# Patient Record
Sex: Male | Born: 1945 | Race: Black or African American | Hispanic: No | Marital: Married | Smoking: Former smoker
Health system: Southern US, Community
[De-identification: ages and names within clinical notes are randomized; demographics above are authoritative.]

## PROBLEM LIST (undated history)

## (undated) ENCOUNTER — Emergency Department (HOSPITAL_COMMUNITY): Payer: Medicare Other | Source: Home / Self Care

## (undated) DIAGNOSIS — E119 Type 2 diabetes mellitus without complications: Secondary | ICD-10-CM

## (undated) DIAGNOSIS — Z860100 Personal history of colon polyps, unspecified: Secondary | ICD-10-CM

## (undated) DIAGNOSIS — I639 Cerebral infarction, unspecified: Secondary | ICD-10-CM

## (undated) DIAGNOSIS — I10 Essential (primary) hypertension: Secondary | ICD-10-CM

## (undated) DIAGNOSIS — G8929 Other chronic pain: Secondary | ICD-10-CM

## (undated) DIAGNOSIS — Z9289 Personal history of other medical treatment: Secondary | ICD-10-CM

## (undated) DIAGNOSIS — M549 Dorsalgia, unspecified: Secondary | ICD-10-CM

## (undated) DIAGNOSIS — E785 Hyperlipidemia, unspecified: Secondary | ICD-10-CM

## (undated) DIAGNOSIS — Z87442 Personal history of urinary calculi: Secondary | ICD-10-CM

## (undated) DIAGNOSIS — K219 Gastro-esophageal reflux disease without esophagitis: Secondary | ICD-10-CM

## (undated) DIAGNOSIS — N400 Enlarged prostate without lower urinary tract symptoms: Secondary | ICD-10-CM

## (undated) HISTORY — PX: OTHER SURGICAL HISTORY: SHX169

## (undated) HISTORY — PX: COLONOSCOPY: SHX174

## (undated) HISTORY — PX: TONSILLECTOMY: SUR1361

## (undated) HISTORY — DX: Cerebral infarction, unspecified: I63.9

## (undated) HISTORY — PX: ESOPHAGOGASTRODUODENOSCOPY: SHX1529

---

## 2000-09-25 ENCOUNTER — Ambulatory Visit (HOSPITAL_BASED_OUTPATIENT_CLINIC_OR_DEPARTMENT_OTHER): Admission: RE | Admit: 2000-09-25 | Discharge: 2000-09-25 | Payer: Self-pay | Admitting: *Deleted

## 2000-09-25 ENCOUNTER — Encounter (INDEPENDENT_AMBULATORY_CARE_PROVIDER_SITE_OTHER): Payer: Self-pay | Admitting: *Deleted

## 2003-06-11 ENCOUNTER — Ambulatory Visit (HOSPITAL_COMMUNITY): Admission: RE | Admit: 2003-06-11 | Discharge: 2003-06-11 | Payer: Self-pay | Admitting: Gastroenterology

## 2003-06-11 ENCOUNTER — Encounter (INDEPENDENT_AMBULATORY_CARE_PROVIDER_SITE_OTHER): Payer: Self-pay | Admitting: *Deleted

## 2004-02-12 ENCOUNTER — Emergency Department (HOSPITAL_COMMUNITY): Admission: EM | Admit: 2004-02-12 | Discharge: 2004-02-12 | Payer: Self-pay | Admitting: Family Medicine

## 2005-02-18 ENCOUNTER — Emergency Department (HOSPITAL_COMMUNITY): Admission: EM | Admit: 2005-02-18 | Discharge: 2005-02-18 | Payer: Self-pay | Admitting: Emergency Medicine

## 2010-04-09 ENCOUNTER — Encounter: Admission: RE | Admit: 2010-04-09 | Discharge: 2010-04-09 | Payer: Self-pay | Admitting: Internal Medicine

## 2010-09-10 ENCOUNTER — Encounter: Payer: Self-pay | Admitting: Internal Medicine

## 2011-01-05 NOTE — Op Note (Signed)
[place]  Patient:    [name] [name], [name] [name]                         MRN: [id number] Proc. Date: 09/25/00 Adm. Date:  [id number] Attending:  Stephenie Acres                           Operative Report  PREOPERATIVE DIAGNOSIS:  Left breast mass.  POSTOPERATIVE DIAGNOSIS:  Left breast mass.  PROCEDURE:  Excisional left breast biopsy.  SURGEON:  Catalina Lunger, M.D.  FINDINGS:  Fatty tumor consistent with a lipoma.  ANESTHESIA:  Local MAC.  DESCRIPTION OF PROCEDURE:  The patient was taken to the operating room and placed in the supine position.  After adequate anesthesia was induced using MAC technique, the left breast was prepped and draped in the normal sterile fashion.  Using 1% lidocaine local anesthesia, the skin and subcutaneous tissue were anesthetized over the 10 oclock lesion of the left breast. Dissected down through subcutaneous tissue and fat under the pectoralis fascia.  The mass was still palpable posterior to the pectoralis fascia. Therefore, the muscles were split until a well-encapsulated fatty tumor which lay posterior to the pectoralis muscle was identified.  It was delivered up and through the wound.  Attachments were divided using Bovie electrocautery. It was sent for pathologic evaluation.  Adequate hemostasis was ensured, and the skin was closed with subcuticular 4-0 Monocryl.  Dermabond dressing was placed.  The patient tolerated the procedure well, went to PACU in good condition. DD:  09/25/00 TD:  09/25/00 Job: [id number]

## 2011-07-03 ENCOUNTER — Other Ambulatory Visit: Payer: Self-pay | Admitting: Gastroenterology

## 2011-09-13 ENCOUNTER — Other Ambulatory Visit: Payer: Self-pay | Admitting: Gastroenterology

## 2013-06-04 ENCOUNTER — Other Ambulatory Visit: Payer: Self-pay | Admitting: Orthopedic Surgery

## 2013-06-24 ENCOUNTER — Encounter (HOSPITAL_COMMUNITY): Payer: Self-pay | Admitting: Pharmacy Technician

## 2013-06-29 NOTE — Pre-Procedure Instructions (Signed)
Clifton Safley [place]  06/29/2013   Your procedure is scheduled on:  Wed, Nov 19 @ 8:30 AM  Report to [facility] Short Stay Entrance A  at 6:30 AM.  Call this number if you have problems the morning of surgery: [phone]   Remember:   Do not eat food or drink liquids after midnight.   Take these medicines the morning of surgery with A SIP OF WATER: Amlodipine(Norvasc)               Stop taking your Aspirin.No Goody's,BC's,Aleve,Ibuprofen,Fish Oil,or any Herbal Medications   Do not wear jewelry  Do not wear lotions, powders, or colognes. You may wear deodorant.  Men may shave face and neck.  Do not bring valuables to the hospital.  [facility] is not responsible                  for any belongings or valuables.               Contacts, dentures or bridgework may not be worn into surgery.  Leave suitcase in the car. After surgery it may be brought to your room.  For patients admitted to the hospital, discharge time is determined by your                treatment team.               Patients discharged the day of surgery will not be allowed to drive  home.    Special Instructions: Shower using CHG 2 nights before surgery and the night before surgery.  If you shower the day of surgery use CHG.  Use special wash - you have one bottle of CHG for all showers.  You should use approximately 1/3 of the bottle for each shower.   Please read over the following fact sheets that you were given: Pain Booklet, Coughing and Deep Breathing, Blood Transfusion Information, MRSA Information and Surgical Site Infection Prevention

## 2013-06-30 ENCOUNTER — Encounter (HOSPITAL_COMMUNITY)
Admission: RE | Admit: 2013-06-30 | Discharge: 2013-06-30 | Disposition: A | Discharge status: Home / Self Care | Payer: Medicare Other | Source: Ambulatory Visit | Attending: Orthopedic Surgery | Admitting: Orthopedic Surgery

## 2013-06-30 ENCOUNTER — Encounter (HOSPITAL_COMMUNITY): Payer: Self-pay

## 2013-06-30 ENCOUNTER — Ambulatory Visit (HOSPITAL_COMMUNITY)
Admission: RE | Admit: 2013-06-30 | Discharge: 2013-06-30 | Disposition: A | Discharge status: Home / Self Care | Payer: Medicare Other | Source: Ambulatory Visit | Attending: Orthopedic Surgery | Admitting: Orthopedic Surgery

## 2013-06-30 DIAGNOSIS — K219 Gastro-esophageal reflux disease without esophagitis: Secondary | ICD-10-CM | POA: Insufficient documentation

## 2013-06-30 DIAGNOSIS — N400 Enlarged prostate without lower urinary tract symptoms: Secondary | ICD-10-CM | POA: Insufficient documentation

## 2013-06-30 DIAGNOSIS — G8929 Other chronic pain: Secondary | ICD-10-CM | POA: Insufficient documentation

## 2013-06-30 DIAGNOSIS — M549 Dorsalgia, unspecified: Secondary | ICD-10-CM | POA: Insufficient documentation

## 2013-06-30 DIAGNOSIS — N182 Chronic kidney disease, stage 2 (mild): Secondary | ICD-10-CM | POA: Insufficient documentation

## 2013-06-30 DIAGNOSIS — Z87891 Personal history of nicotine dependence: Secondary | ICD-10-CM | POA: Insufficient documentation

## 2013-06-30 DIAGNOSIS — I129 Hypertensive chronic kidney disease with stage 1 through stage 4 chronic kidney disease, or unspecified chronic kidney disease: Secondary | ICD-10-CM | POA: Insufficient documentation

## 2013-06-30 HISTORY — DX: Personal history of colonic polyps: Z86.010

## 2013-06-30 HISTORY — DX: Hyperlipidemia, unspecified: E78.5

## 2013-06-30 HISTORY — DX: Gastro-esophageal reflux disease without esophagitis: K21.9

## 2013-06-30 HISTORY — DX: Other chronic pain: G89.29

## 2013-06-30 HISTORY — DX: Dorsalgia, unspecified: M54.9

## 2013-06-30 HISTORY — DX: Personal history of colon polyps, unspecified: Z86.0100

## 2013-06-30 HISTORY — DX: Personal history of urinary calculi: Z87.442

## 2013-06-30 HISTORY — DX: Essential (primary) hypertension: I10

## 2013-06-30 HISTORY — DX: Benign prostatic hyperplasia without lower urinary tract symptoms: N40.0

## 2013-06-30 LAB — URINALYSIS, ROUTINE W REFLEX MICROSCOPIC
Bilirubin Urine: NEGATIVE
Glucose, UA: NEGATIVE mg/dL
Hgb urine dipstick: NEGATIVE
Leukocytes, UA: NEGATIVE
Protein, ur: NEGATIVE mg/dL
Urobilinogen, UA: 1 mg/dL (ref 0.0–1.0)
pH: 5.5 (ref 5.0–8.0)

## 2013-06-30 LAB — TYPE AND SCREEN
ABO/RH(D): A POS
Antibody Screen: NEGATIVE

## 2013-06-30 LAB — CBC WITH DIFFERENTIAL/PLATELET
Eosinophils Absolute: 0.3 10*3/uL (ref 0.0–0.7)
Eosinophils Relative: 4 % (ref 0–5)
Hemoglobin: 14.4 g/dL (ref 13.0–17.0)
Lymphs Abs: 1.9 10*3/uL (ref 0.7–4.0)
MCH: 30.7 pg (ref 26.0–34.0)
MCV: 88.5 fL (ref 78.0–100.0)
Monocytes Absolute: 0.7 10*3/uL (ref 0.1–1.0)
Monocytes Relative: 9 % (ref 3–12)
Platelets: 164 10*3/uL (ref 150–400)
RBC: 4.69 MIL/uL (ref 4.22–5.81)
RDW: 13.8 % (ref 11.5–15.5)

## 2013-06-30 LAB — ABO/RH: ABO/RH(D): A POS

## 2013-06-30 LAB — COMPREHENSIVE METABOLIC PANEL
Alkaline Phosphatase: 73 U/L (ref 39–117)
BUN: 12 mg/dL (ref 6–23)
Calcium: 9.6 mg/dL (ref 8.4–10.5)
Creatinine, Ser: 1.42 mg/dL — ABNORMAL HIGH (ref 0.50–1.35)
GFR calc Af Amer: 57 mL/min — ABNORMAL LOW (ref >90)
GFR calc non Af Amer: 49 mL/min — ABNORMAL LOW (ref >90)
Glucose, Bld: 99 mg/dL (ref 70–99)
Sodium: 142 mEq/L (ref 135–145)
Total Protein: 7.3 g/dL (ref 6.0–8.3)

## 2013-06-30 LAB — PROTIME-INR: Prothrombin Time: 13.3 seconds (ref 11.6–15.2)

## 2013-06-30 LAB — SURGICAL PCR SCREEN: MRSA, PCR: NEGATIVE

## 2013-06-30 MED ORDER — POVIDONE-IODINE 7.5 % EX SOLN: Freq: Once | CUTANEOUS | Status: DC

## 2013-06-30 NOTE — Progress Notes (Signed)
Pt doesn't have a cardiologist  Stress test done >33yrs was done during a routine physical  Denies ever having an echo or heart cath  Medical Md is Dr.Karrar Husain  EKG in chart from 06-24-13  Denies CXR in past yr

## 2013-07-01 NOTE — Progress Notes (Signed)
Anesthesia Chart Review:  Patient is a 67 year old male scheduled for L3-4, L4-5 decompression on 07/08/13 by Dr. Yevette Edwards.  History includes former smoker, HTN, HLD, chronic back pain, GERD, BPH, nephrolithiasis, CKD stage II, tonsillectomy. PCP is Dr. Tyson Dense [facility]) who cleared patient for this procedure.    EKG done at Dr. Paulita Fujita office on 06/24/13 showed SB @ 52, old inferior infarct, consider old anterior infarct, non-specific ST depression.  EKG was reviewed and patient was evaluated by Dr. Eula Listen who ultimately cleared him for this procedure.  CXR on 06/30/13 showed: Cardiac enlargement without heart failure.  Aorta is uncoiled and tortuous.  The lungs are clear without infiltrate, effusion, or mass. (CXR films reviewed with Dr. Noreene Larsson.)  Preoperative labs noted.  Patient has had recent evaluation with EKG with his PCP Dr. Eula Listen who cleared him for this procedure.  If no significant change/new CV symptomology then I would anticipate that he could proceed as planned.  Velna Ochs [facility] Short Stay Center/Anesthesiology Phone [phone] 07/02/2013 3:02 PM

## 2013-07-02 ENCOUNTER — Other Ambulatory Visit: Payer: Self-pay | Admitting: Orthopedic Surgery

## 2013-07-07 MED ORDER — CEFAZOLIN SODIUM-DEXTROSE 2-3 GM-% IV SOLR
2.0000 g | INTRAVENOUS | Status: AC
  Administered 2013-07-08: 2 g via INTRAVENOUS
  Filled 2013-07-07: qty 50

## 2013-07-08 ENCOUNTER — Ambulatory Visit (HOSPITAL_COMMUNITY)
Admission: RE | Admit: 2013-07-08 | Discharge: 2013-07-08 | Disposition: A | Discharge status: Home / Self Care | Payer: Medicare Other | Source: Ambulatory Visit | Attending: Orthopedic Surgery | Admitting: Orthopedic Surgery

## 2013-07-08 ENCOUNTER — Encounter (HOSPITAL_COMMUNITY): Payer: Self-pay | Admitting: Certified Registered Nurse Anesthetist

## 2013-07-08 ENCOUNTER — Encounter (HOSPITAL_COMMUNITY)
Admission: RE | Disposition: A | Discharge status: Home / Self Care | Payer: Self-pay | Source: Ambulatory Visit | Attending: Orthopedic Surgery

## 2013-07-08 ENCOUNTER — Ambulatory Visit (HOSPITAL_COMMUNITY): Payer: Medicare Other | Admitting: Certified Registered"

## 2013-07-08 ENCOUNTER — Ambulatory Visit (HOSPITAL_COMMUNITY): Payer: Medicare Other

## 2013-07-08 ENCOUNTER — Encounter (HOSPITAL_COMMUNITY): Payer: Medicare Other | Admitting: Vascular Surgery

## 2013-07-08 DIAGNOSIS — M48061 Spinal stenosis, lumbar region without neurogenic claudication: Secondary | ICD-10-CM

## 2013-07-08 DIAGNOSIS — K219 Gastro-esophageal reflux disease without esophagitis: Secondary | ICD-10-CM | POA: Insufficient documentation

## 2013-07-08 DIAGNOSIS — D1739 Benign lipomatous neoplasm of skin and subcutaneous tissue of other sites: Secondary | ICD-10-CM | POA: Insufficient documentation

## 2013-07-08 DIAGNOSIS — E785 Hyperlipidemia, unspecified: Secondary | ICD-10-CM | POA: Insufficient documentation

## 2013-07-08 DIAGNOSIS — Z7982 Long term (current) use of aspirin: Secondary | ICD-10-CM | POA: Insufficient documentation

## 2013-07-08 DIAGNOSIS — I10 Essential (primary) hypertension: Secondary | ICD-10-CM | POA: Insufficient documentation

## 2013-07-08 DIAGNOSIS — M48062 Spinal stenosis, lumbar region with neurogenic claudication: Secondary | ICD-10-CM | POA: Insufficient documentation

## 2013-07-08 HISTORY — PX: LUMBAR LAMINECTOMY/DECOMPRESSION MICRODISCECTOMY: SHX5026

## 2013-07-08 SURGERY — LUMBAR LAMINECTOMY/DECOMPRESSION MICRODISCECTOMY
Anesthesia: General | Site: Spine Lumbar | Wound class: Clean

## 2013-07-08 MED ORDER — THROMBIN 20000 UNITS EX SOLR
CUTANEOUS | Status: DC | PRN
  Administered 2013-07-08: 09:00:00

## 2013-07-08 MED ORDER — MIDAZOLAM HCL 5 MG/5ML IJ SOLN
INTRAMUSCULAR | Status: DC | PRN
  Administered 2013-07-08: 2 mg via INTRAVENOUS

## 2013-07-08 MED ORDER — ROCURONIUM BROMIDE 100 MG/10ML IV SOLN
INTRAVENOUS | Status: DC | PRN
  Administered 2013-07-08: 10 mg via INTRAVENOUS
  Administered 2013-07-08: 50 mg via INTRAVENOUS
  Administered 2013-07-08: 10 mg via INTRAVENOUS

## 2013-07-08 MED ORDER — METHYLENE BLUE 1 % INJ SOLN
INTRAMUSCULAR | Status: DC | PRN
  Administered 2013-07-08: .5 mL

## 2013-07-08 MED ORDER — HYDROMORPHONE HCL PF 1 MG/ML IJ SOLN
0.2500 mg | INTRAMUSCULAR | Status: DC | PRN
  Administered 2013-07-08 (×2): 0.5 mg via INTRAVENOUS

## 2013-07-08 MED ORDER — ONDANSETRON HCL 4 MG/2ML IJ SOLN
INTRAMUSCULAR | Status: DC | PRN
  Administered 2013-07-08: 4 mg via INTRAVENOUS

## 2013-07-08 MED ORDER — METHYLENE BLUE 1 % INJ SOLN
INTRAMUSCULAR | Status: AC
  Filled 2013-07-08: qty 10

## 2013-07-08 MED ORDER — OXYCODONE-ACETAMINOPHEN 5-325 MG PO TABS
1.0000 | ORAL_TABLET | ORAL | Status: DC | PRN
  Administered 2013-07-08: 2 via ORAL

## 2013-07-08 MED ORDER — ONDANSETRON HCL 4 MG/2ML IJ SOLN: 4.0000 mg | Freq: Once | INTRAMUSCULAR | Status: DC | PRN

## 2013-07-08 MED ORDER — HYDROMORPHONE HCL PF 1 MG/ML IJ SOLN
INTRAMUSCULAR | Status: AC
  Filled 2013-07-08: qty 1

## 2013-07-08 MED ORDER — EPHEDRINE SULFATE 50 MG/ML IJ SOLN
INTRAMUSCULAR | Status: DC | PRN
  Administered 2013-07-08: 10 mg via INTRAVENOUS
  Administered 2013-07-08 (×3): 5 mg via INTRAVENOUS

## 2013-07-08 MED ORDER — MINERAL OIL LIGHT 100 % EX OIL
TOPICAL_OIL | CUTANEOUS | Status: AC
  Filled 2013-07-08: qty 25

## 2013-07-08 MED ORDER — LACTATED RINGERS IV SOLN
INTRAVENOUS | Status: DC | PRN
  Administered 2013-07-08 (×2): via INTRAVENOUS

## 2013-07-08 MED ORDER — THROMBIN 20000 UNITS EX SOLR
CUTANEOUS | Status: AC
  Filled 2013-07-08: qty 20000

## 2013-07-08 MED ORDER — FENTANYL CITRATE 0.05 MG/ML IJ SOLN
INTRAMUSCULAR | Status: DC | PRN
  Administered 2013-07-08 (×2): 50 ug via INTRAVENOUS
  Administered 2013-07-08: 100 ug via INTRAVENOUS
  Administered 2013-07-08: 50 ug via INTRAVENOUS

## 2013-07-08 MED ORDER — OXYCODONE-ACETAMINOPHEN 5-325 MG PO TABS
ORAL_TABLET | ORAL | Status: AC
  Administered 2013-07-08: 2 via ORAL
  Filled 2013-07-08: qty 2

## 2013-07-08 MED ORDER — GLYCOPYRROLATE 0.2 MG/ML IJ SOLN
INTRAMUSCULAR | Status: DC | PRN
  Administered 2013-07-08: .8 mg via INTRAVENOUS

## 2013-07-08 MED ORDER — LIDOCAINE HCL (CARDIAC) 20 MG/ML IV SOLN
INTRAVENOUS | Status: DC | PRN
  Administered 2013-07-08: 80 mg via INTRAVENOUS

## 2013-07-08 MED ORDER — PROPOFOL 10 MG/ML IV BOLUS
INTRAVENOUS | Status: DC | PRN
  Administered 2013-07-08: 200 mg via INTRAVENOUS

## 2013-07-08 MED ORDER — HEMOSTATIC AGENTS (NO CHARGE) OPTIME
TOPICAL | Status: DC | PRN
  Administered 2013-07-08: 1

## 2013-07-08 MED ORDER — METHYLPREDNISOLONE ACETATE 40 MG/ML IJ SUSP
INTRAMUSCULAR | Status: DC | PRN
  Administered 2013-07-08: 1 mL

## 2013-07-08 MED ORDER — NEOSTIGMINE METHYLSULFATE 1 MG/ML IJ SOLN
INTRAMUSCULAR | Status: DC | PRN
  Administered 2013-07-08: 5 mg via INTRAVENOUS

## 2013-07-08 MED ORDER — BUPIVACAINE-EPINEPHRINE 0.25% -1:200000 IJ SOLN
INTRAMUSCULAR | Status: DC | PRN
  Administered 2013-07-08: 4 mL

## 2013-07-08 MED ORDER — METHYLPREDNISOLONE ACETATE 40 MG/ML IJ SUSP
INTRAMUSCULAR | Status: AC
  Filled 2013-07-08: qty 1

## 2013-07-08 SURGICAL SUPPLY — 71 items
APL SKNCLS STERI-STRIP NONHPOA (GAUZE/BANDAGES/DRESSINGS) ×1
BENZOIN TINCTURE PRP APPL 2/3 (GAUZE/BANDAGES/DRESSINGS) ×1 IMPLANT
BUR ROUND PRECISION 4.0 (BURR) ×2 IMPLANT
CANISTER SUCTION 2500CC (MISCELLANEOUS) ×2 IMPLANT
CARTRIDGE OIL MAESTRO DRILL (MISCELLANEOUS) ×1 IMPLANT
CLOTH BEACON ORANGE TIMEOUT ST (SAFETY) ×2 IMPLANT
CLSR STERI-STRIP ANTIMIC 1/2X4 (GAUZE/BANDAGES/DRESSINGS) ×1 IMPLANT
CORDS BIPOLAR (ELECTRODE) ×2 IMPLANT
COVER SURGICAL LIGHT HANDLE (MISCELLANEOUS) ×2 IMPLANT
DIFFUSER DRILL AIR PNEUMATIC (MISCELLANEOUS) ×2 IMPLANT
DRAIN CHANNEL 15F RND FF W/TCR (WOUND CARE) IMPLANT
DRAPE POUCH INSTRU U-SHP 10X18 (DRAPES) ×4 IMPLANT
DRAPE SURG 17X23 STRL (DRAPES) ×8 IMPLANT
DURAPREP 26ML APPLICATOR (WOUND CARE) ×2 IMPLANT
ELECT BLADE 4.0 EZ CLEAN MEGAD (MISCELLANEOUS)
ELECT CAUTERY BLADE 6.4 (BLADE) ×2 IMPLANT
ELECT REM PT RETURN 9FT ADLT (ELECTROSURGICAL) ×2
ELECTRODE BLDE 4.0 EZ CLN MEGD (MISCELLANEOUS) IMPLANT
ELECTRODE REM PT RTRN 9FT ADLT (ELECTROSURGICAL) ×1 IMPLANT
EVACUATOR SILICONE 100CC (DRAIN) IMPLANT
FILTER STRAW FLUID ASPIR (MISCELLANEOUS) ×2 IMPLANT
GAUZE SPONGE 4X4 16PLY XRAY LF (GAUZE/BANDAGES/DRESSINGS) ×4 IMPLANT
GLOVE BIO SURGEON STRL SZ7 (GLOVE) ×2 IMPLANT
GLOVE BIO SURGEON STRL SZ8 (GLOVE) ×2 IMPLANT
GLOVE BIOGEL PI IND STRL 7.0 (GLOVE) ×1 IMPLANT
GLOVE BIOGEL PI IND STRL 8 (GLOVE) ×1 IMPLANT
GLOVE BIOGEL PI INDICATOR 7.0 (GLOVE) ×1
GLOVE BIOGEL PI INDICATOR 8 (GLOVE) ×1
GOWN STRL NON-REIN LRG LVL3 (GOWN DISPOSABLE) ×2 IMPLANT
GOWN STRL REIN XL XLG (GOWN DISPOSABLE) ×4 IMPLANT
IV CATH 14GX2 1/4 (CATHETERS) ×2 IMPLANT
KIT BASIN OR (CUSTOM PROCEDURE TRAY) ×2 IMPLANT
KIT POSITION SURG JACKSON T1 (MISCELLANEOUS) ×2 IMPLANT
KIT ROOM TURNOVER OR (KITS) ×2 IMPLANT
NDL 18GX1X1/2 (RX/OR ONLY) (NEEDLE) ×1 IMPLANT
NDL HYPO 25GX1X1/2 BEV (NEEDLE) ×1 IMPLANT
NDL SPNL 18GX3.5 QUINCKE PK (NEEDLE) ×2 IMPLANT
NEEDLE 18GX1X1/2 (RX/OR ONLY) (NEEDLE) ×2 IMPLANT
NEEDLE 22X1 1/2 (OR ONLY) (NEEDLE) ×2 IMPLANT
NEEDLE HYPO 25GX1X1/2 BEV (NEEDLE) ×2 IMPLANT
NEEDLE SPNL 18GX3.5 QUINCKE PK (NEEDLE) ×4 IMPLANT
NS IRRIG 1000ML POUR BTL (IV SOLUTION) ×2 IMPLANT
OIL CARTRIDGE MAESTRO DRILL (MISCELLANEOUS) ×2
PACK LAMINECTOMY ORTHO (CUSTOM PROCEDURE TRAY) ×2 IMPLANT
PACK UNIVERSAL I (CUSTOM PROCEDURE TRAY) ×2 IMPLANT
PAD ARMBOARD 7.5X6 YLW CONV (MISCELLANEOUS) ×4 IMPLANT
PATTIES SURGICAL .5 X.5 (GAUZE/BANDAGES/DRESSINGS) IMPLANT
PATTIES SURGICAL .5 X1 (DISPOSABLE) ×2 IMPLANT
SPONGE GAUZE 4X4 12PLY (GAUZE/BANDAGES/DRESSINGS) ×2 IMPLANT
SPONGE INTESTINAL PEANUT (DISPOSABLE) ×2 IMPLANT
SPONGE SURGIFOAM ABS GEL 100 (HEMOSTASIS) ×2 IMPLANT
STRIP CLOSURE SKIN 1/2X4 (GAUZE/BANDAGES/DRESSINGS) IMPLANT
SURGIFLO TRUKIT (HEMOSTASIS) IMPLANT
SUT MNCRL AB 4-0 PS2 18 (SUTURE) ×2 IMPLANT
SUT VIC AB 0 CT1 18XCR BRD 8 (SUTURE) IMPLANT
SUT VIC AB 0 CT1 27 (SUTURE)
SUT VIC AB 0 CT1 27XBRD ANBCTR (SUTURE) IMPLANT
SUT VIC AB 0 CT1 8-18 (SUTURE) ×2
SUT VIC AB 1 CT1 18XCR BRD 8 (SUTURE) ×1 IMPLANT
SUT VIC AB 1 CT1 8-18 (SUTURE) ×2
SUT VIC AB 2-0 CT2 18 VCP726D (SUTURE) ×2 IMPLANT
SYR 20CC LL (SYRINGE) IMPLANT
SYR BULB IRRIGATION 50ML (SYRINGE) ×2 IMPLANT
SYR CONTROL 10ML LL (SYRINGE) ×4 IMPLANT
SYR TB 1ML 26GX3/8 SAFETY (SYRINGE) ×4 IMPLANT
SYR TB 1ML LUER SLIP (SYRINGE) ×4 IMPLANT
TAPE CLOTH SURG 4X10 WHT LF (GAUZE/BANDAGES/DRESSINGS) ×1 IMPLANT
TOWEL OR 17X24 6PK STRL BLUE (TOWEL DISPOSABLE) ×2 IMPLANT
TOWEL OR 17X26 10 PK STRL BLUE (TOWEL DISPOSABLE) ×2 IMPLANT
WATER STERILE IRR 1000ML POUR (IV SOLUTION) ×2 IMPLANT
YANKAUER SUCT BULB TIP NO VENT (SUCTIONS) ×2 IMPLANT

## 2013-07-08 NOTE — H&P (Signed)
PREOPERATIVE H&P  Chief Complaint: bilateral leg pain  HPI: [name] [name] is a 67 y.o. male who presents withongoing L > R leg pain. MRI = 3-5 severe spinal stenosis. Patient failed multiple forms of conservative care.  Past Medical History  Diagnosis Date  . Hypertension     takes Amlodipine daily and Lotensin as well  . Hyperlipidemia     takes Crestor daily  . Chronic back pain     stenosis  . GERD (gastroesophageal reflux disease)     occasionally will take a zantac(maybe once a month)  . History of colon polyps   . Enlarged prostate   . History of kidney stones    Past Surgical History  Procedure Laterality Date  . Tonsillectomy      as a child  . Fatty tissue removed from stomach    . Colonoscopy    . Esophagogastroduodenoscopy    . Right ankle surgery      as child    History   Social History  . Marital Status: Married    Spouse Name: N/A    Number of Children: N/A  . Years of Education: N/A   Social History Main Topics  . Smoking status: Former Smoker -- 0.25 packs/day for 40 years    Types: Cigarettes  . Smokeless tobacco: None     Comment: none since 06/24/13  . Alcohol Use: No  . Drug Use: No  . Sexual Activity: None   Other Topics Concern  . None   Social History Narrative  . None   History reviewed. No pertinent family history. No Known Allergies Prior to Admission medications   Medication Sig Start Date End Date Taking? Authorizing Provider  acetaminophen (TYLENOL) 500 MG tablet Take 1,000 mg by mouth daily as needed for mild pain.   Yes Historical Provider, MD  amLODipine (NORVASC) 5 MG tablet Take 5 mg by mouth daily.   Yes Historical Provider, MD  aspirin 81 MG tablet Take 81 mg by mouth daily.   Yes Historical Provider, MD  benazepril-hydrochlorthiazide (LOTENSIN HCT) 20-25 MG per tablet Take 1 tablet by mouth daily.   Yes Historical Provider, MD  rosuvastatin (CRESTOR) 10 MG tablet Take 10 mg by mouth daily.   Yes Historical Provider,  MD     All other systems have been reviewed and were otherwise negative with the exception of those mentioned in the HPI and as above.  Physical Exam: Filed Vitals:   07/08/13 0732  BP: 169/91  Pulse:   Temp:   Resp: 18    General: Alert, no acute distress Cardiovascular: No pedal edema Respiratory: No cyanosis, no use of accessory musculature Skin: No lesions in the area of chief complaint Neurologic: Sensation intact distally Psychiatric: Patient is competent for consent with normal mood and affect Lymphatic: No axillary or cervical lymphadenopathy   Assessment/Plan: Lumbar stenosis Plan for Procedure(s): LUMBAR LAMINECTOMY/DECOMPRESSION L3-5   Emilee Hero, MD 07/08/2013 8:11 AM

## 2013-07-08 NOTE — Anesthesia Preprocedure Evaluation (Addendum)
Anesthesia Evaluation  Patient identified by MRN, date of birth, ID band Patient awake    Reviewed: Allergy & Precautions, H&P , NPO status , Patient's Chart, lab work & pertinent test results  Airway Mallampati: II TM Distance: >3 FB Neck ROM: Full    Dental  (+) Teeth Intact and Missing,    Pulmonary Current Smoker, former smoker,  breath sounds clear to auscultation        Cardiovascular hypertension, Rhythm:Regular Rate:Normal     Neuro/Psych    GI/Hepatic GERD-  ,  Endo/Other    Renal/GU      Musculoskeletal   Abdominal Normal abdominal exam  (+)   Peds  Hematology   Anesthesia Other Findings   Reproductive/Obstetrics                          Anesthesia Physical Anesthesia Plan  ASA: II  Anesthesia Plan: General   Post-op Pain Management:    Induction: Intravenous  Airway Management Planned: Oral ETT  Additional Equipment:   Intra-op Plan:   Post-operative Plan: Extubation in OR  Informed Consent: I have reviewed the patients History and Physical, chart, labs and discussed the procedure including the risks, benefits and alternatives for the proposed anesthesia with the patient or authorized representative who has indicated his/her understanding and acceptance.   Dental advisory given  Plan Discussed with: CRNA, Anesthesiologist and Surgeon  Anesthesia Plan Comments:        Anesthesia Quick Evaluation

## 2013-07-08 NOTE — Preoperative (Signed)
Beta Blockers   Reason not to administer Beta Blockers:Not Applicable 

## 2013-07-08 NOTE — Anesthesia Postprocedure Evaluation (Signed)
  Anesthesia Post-op Note  Patient: [name] [name]  Procedure(s) Performed: Procedure(s) with comments: LUMBAR LAMINECTOMY/DECOMPRESSION MICRODISCECTOMY (N/A) - Lumbar 3-4, lumbar 4-5 decompression  Patient Location: PACU  Anesthesia Type:General  Level of Consciousness: awake, alert , oriented and patient cooperative  Airway and Oxygen Therapy: Patient Spontanous Breathing  Post-op Pain: mild  Post-op Assessment: Post-op Vital signs reviewed, Patient's Cardiovascular Status Stable, Respiratory Function Stable, Patent Airway, No signs of Nausea or vomiting and Pain level controlled  Post-op Vital Signs: stable  Complications: No apparent anesthesia complications

## 2013-07-08 NOTE — Transfer of Care (Signed)
Immediate Anesthesia Transfer of Care Note  Patient: [name] [name]  Procedure(s) Performed: Procedure(s) with comments: LUMBAR LAMINECTOMY/DECOMPRESSION MICRODISCECTOMY (N/A) - Lumbar 3-4, lumbar 4-5 decompression  Patient Location: PACU  Anesthesia Type:General  Level of Consciousness: awake, alert  and oriented  Airway & Oxygen Therapy: Patient Spontanous Breathing  Post-op Assessment: Report given to PACU RN  Post vital signs: Reviewed and stable  Complications: No apparent anesthesia complications

## 2013-07-08 NOTE — Progress Notes (Signed)
Sacral dressing not needed.

## 2013-07-08 NOTE — Progress Notes (Signed)
Patient states pain of 5 is an ok level to go home with.

## 2013-07-09 NOTE — Op Note (Signed)
NAME:  [name] [name], [name] [name] NO.:  [id number]  MEDICAL RECORD NO.:  [id number]  LOCATION:  [facility]                         FACILITY:  [facility]  PHYSICIAN:  Estill Bamberg, MD      DATE OF BIRTH:  [birth year]  DATE OF PROCEDURE:  07/08/2013 DATE OF DISCHARGE:  07/08/2013                              OPERATIVE REPORT   PREOPERATIVE DIAGNOSES: 1. L3-4, L4-5 spinal stenosis. 2. Neurogenic claudication.  POSTOPERATIVE DIAGNOSES: 1. L3-4, L4-5 spinal stenosis. 2. Neurogenic claudication.  PROCEDURES:  L3-4, L4-5 laminectomy with bilateral partial facetectomy and bilateral foraminotomy.  SURGEON:  Estill Bamberg, MD  ASSISTANT:  Jason Coop, PA-C  ANESTHESIA:  General endotracheal anesthesia.  COMPLICATIONS:  None.  DISPOSITION:  Stable.  ESTIMATED BLOOD LOSS:  100 mL.  INDICATIONS FOR PROCEDURE:  Briefly, [name] [name] is a very pleasant 67- year-old male, who did present to me on Jan 09, 2013, with significant bilateral leg pain.  An MRI did reveal a rather substantial spinal stenosis at L4-5 greater than L3-4.  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  In any event, the patient did go forward with an extensive conservative care including physical therapy, and multiple epidural injections.  The patient did get temporary relief with these injections, but his pain did recur after each one.  Given his ongoing pain and disability, we did discuss proceeding with a 2-level decompressive procedure.  The patient did fully understand the risks and limitations of the procedure as outlined in my preoperative note.  OPERATIVE DETAILS:  On July 08, 2013, the patient was brought to surgery and general endotracheal anesthesia was administered.  The patient was placed prone on a well-padded Jackson frame available with a spinal frame.  Antibiotics were given and a time-out procedure  was performed.  The back was prepped and draped in the usual sterile fashion.  I then made an incision overlying the L3-4 and L4-5 interspaces.  Of note, there was a superficial lipoma encountered directly over the midline.  The majority of lipoma was removed.  I then incised the fascia at the midline.  The paraspinal musculature was bluntly swept laterally on the right and the left sides.  The lamina of L3, L4, and L5 was identified and subperiosteally exposed.  A lateral intraoperative radiograph did confirm the appropriate operative level. I then proceeded with removal of the L3 and L4 spinous processes.  I then used a 4-mm high-speed bur to remove the central aspect of the L3 and L4 lamina.  I then used a series of Kerrison punches to perform a thorough and complete central laminectomy.  Then, first on the patient's right side, I did perform a lateral recess decompression from superiorly to inferiorly, removing the facet hypertrophy and substantial ligamentum flavum.  A foraminotomy was also performed by removing the superior articular processes causing the neuroforaminal stenosis.  I then turned my attention towards the patient's left side.  Once again, a lateral recess decompression and a neural foraminal decompression was performed as previously described.  Of note, there was abundant facet hypertrophy noted bilaterally, which was clearly  causing substantial compression of the spinal canal.  However, I was extremely pleased with the final decompression noted.  There was some minor epidural bleeding which was controlled using bipolar electrocautery.  Surgiflo was also utilized to control the bleeding.  At the termination of this portion of the procedure, there was no undue bleeding noted and there was no extravasation of cerebrospinal fluid noted.  I then copiously irrigated the wound with approximately 1 L of normal saline.  A 40 mg of Depo- Medrol was introduced about the epidural  space.  The fascia was then closed using #1 Vicryl.  The subcutaneous layer was then closed using 2- 0 Vicryl and the skin was closed using 3-0 Monocryl.  Benzoin and Steri- Strips were applied and followed by sterile dressing.  All instrument counts were correct at the termination of the procedure.  Of note, Jason Coop was my assistant throughout the entirety of the procedure, and did aid in essential retraction and suctioning needed throughout the surgery.     Estill Bamberg, MD     MD/MEDQ  D:  07/08/2013  T:  07/09/2013  Job:  [id number]  cc:   Georgann Housekeeper, MD

## 2013-07-10 ENCOUNTER — Encounter (HOSPITAL_COMMUNITY): Payer: Self-pay | Admitting: Orthopedic Surgery

## 2014-11-23 ENCOUNTER — Other Ambulatory Visit: Payer: Self-pay | Admitting: Orthopedic Surgery

## 2014-12-10 ENCOUNTER — Encounter (HOSPITAL_COMMUNITY): Payer: Self-pay

## 2014-12-10 ENCOUNTER — Encounter (HOSPITAL_COMMUNITY)
Admission: RE | Admit: 2014-12-10 | Discharge: 2014-12-10 | Disposition: A | Discharge status: Home / Self Care | Payer: Medicare Other | Source: Ambulatory Visit | Attending: Orthopedic Surgery | Admitting: Orthopedic Surgery

## 2014-12-10 ENCOUNTER — Ambulatory Visit (HOSPITAL_COMMUNITY)
Admission: RE | Admit: 2014-12-10 | Discharge: 2014-12-10 | Disposition: A | Discharge status: Home / Self Care | Payer: Medicare Other | Source: Ambulatory Visit | Attending: Orthopedic Surgery | Admitting: Orthopedic Surgery

## 2014-12-10 DIAGNOSIS — Z87891 Personal history of nicotine dependence: Secondary | ICD-10-CM | POA: Diagnosis not present

## 2014-12-10 DIAGNOSIS — Z01818 Encounter for other preprocedural examination: Secondary | ICD-10-CM | POA: Diagnosis not present

## 2014-12-10 DIAGNOSIS — R9431 Abnormal electrocardiogram [ECG] [EKG]: Secondary | ICD-10-CM | POA: Insufficient documentation

## 2014-12-10 DIAGNOSIS — R05 Cough: Secondary | ICD-10-CM | POA: Diagnosis not present

## 2014-12-10 DIAGNOSIS — M79604 Pain in right leg: Secondary | ICD-10-CM | POA: Diagnosis not present

## 2014-12-10 DIAGNOSIS — M4806 Spinal stenosis, lumbar region: Secondary | ICD-10-CM | POA: Insufficient documentation

## 2014-12-10 DIAGNOSIS — Z0183 Encounter for blood typing: Secondary | ICD-10-CM | POA: Diagnosis not present

## 2014-12-10 DIAGNOSIS — R001 Bradycardia, unspecified: Secondary | ICD-10-CM | POA: Diagnosis not present

## 2014-12-10 DIAGNOSIS — M79605 Pain in left leg: Secondary | ICD-10-CM | POA: Diagnosis not present

## 2014-12-10 DIAGNOSIS — I10 Essential (primary) hypertension: Secondary | ICD-10-CM | POA: Insufficient documentation

## 2014-12-10 DIAGNOSIS — Z01812 Encounter for preprocedural laboratory examination: Secondary | ICD-10-CM | POA: Diagnosis not present

## 2014-12-10 HISTORY — DX: Personal history of other medical treatment: Z92.89

## 2014-12-10 LAB — CBC WITH DIFFERENTIAL/PLATELET
Basophils Absolute: 0 10*3/uL (ref 0.0–0.1)
Basophils Relative: 0 % (ref 0–1)
Eosinophils Absolute: 0.2 10*3/uL (ref 0.0–0.7)
Eosinophils Relative: 3 % (ref 0–5)
HEMATOCRIT: 40.6 % (ref 39.0–52.0)
HEMOGLOBIN: 13.8 g/dL (ref 13.0–17.0)
LYMPHS ABS: 1.7 10*3/uL (ref 0.7–4.0)
LYMPHS PCT: 24 % (ref 12–46)
MCH: 29.9 pg (ref 26.0–34.0)
MCHC: 34 g/dL (ref 30.0–36.0)
MCV: 87.9 fL (ref 78.0–100.0)
MONOS PCT: 9 % (ref 3–12)
Monocytes Absolute: 0.6 10*3/uL (ref 0.1–1.0)
Neutro Abs: 4.5 10*3/uL (ref 1.7–7.7)
Neutrophils Relative %: 64 % (ref 43–77)
Platelets: 222 10*3/uL (ref 150–400)
RBC: 4.62 MIL/uL (ref 4.22–5.81)
RDW: 14 % (ref 11.5–15.5)
WBC: 7 10*3/uL (ref 4.0–10.5)

## 2014-12-10 LAB — TYPE AND SCREEN
ABO/RH(D): A POS
ANTIBODY SCREEN: NEGATIVE

## 2014-12-10 LAB — COMPREHENSIVE METABOLIC PANEL
ALK PHOS: 61 U/L (ref 39–117)
ALT: 69 U/L — ABNORMAL HIGH (ref 0–53)
ANION GAP: 11 (ref 5–15)
AST: 34 U/L (ref 0–37)
Albumin: 4 g/dL (ref 3.5–5.2)
BILIRUBIN TOTAL: 1 mg/dL (ref 0.3–1.2)
BUN: 10 mg/dL (ref 6–23)
CHLORIDE: 103 mmol/L (ref 96–112)
CO2: 28 mmol/L (ref 19–32)
Calcium: 9.5 mg/dL (ref 8.4–10.5)
Creatinine, Ser: 1.42 mg/dL — ABNORMAL HIGH (ref 0.50–1.35)
GFR, EST AFRICAN AMERICAN: 57 mL/min — AB (ref >90)
GFR, EST NON AFRICAN AMERICAN: 49 mL/min — AB (ref >90)
GLUCOSE: 110 mg/dL — AB (ref 70–99)
POTASSIUM: 3.3 mmol/L — AB (ref 3.5–5.1)
Sodium: 142 mmol/L (ref 135–145)
Total Protein: 7.3 g/dL (ref 6.0–8.3)

## 2014-12-10 LAB — SURGICAL PCR SCREEN
MRSA, PCR: NEGATIVE
Staphylococcus aureus: NEGATIVE

## 2014-12-10 LAB — URINALYSIS, ROUTINE W REFLEX MICROSCOPIC
BILIRUBIN URINE: NEGATIVE
GLUCOSE, UA: NEGATIVE mg/dL
HGB URINE DIPSTICK: NEGATIVE
Ketones, ur: NEGATIVE mg/dL
Leukocytes, UA: NEGATIVE
Nitrite: NEGATIVE
PROTEIN: NEGATIVE mg/dL
Specific Gravity, Urine: 1.022 (ref 1.005–1.030)
UROBILINOGEN UA: 1 mg/dL (ref 0.0–1.0)
pH: 5.5 (ref 5.0–8.0)

## 2014-12-10 LAB — PROTIME-INR
INR: 1.06 (ref 0.00–1.49)
PROTHROMBIN TIME: 14 s (ref 11.6–15.2)

## 2014-12-10 LAB — APTT: aPTT: 33 seconds (ref 24–37)

## 2014-12-10 NOTE — Pre-Procedure Instructions (Signed)
Humbert Morozov [place]  12/10/2014   Your procedure is scheduled on: Wednesday, December 15, 2014  Report to [facility] Admitting at 12:00 PM.  Call this number if you have problems the morning of surgery: [phone]   Remember:   Do not eat food or drink liquids after midnight Tuesday, December 14, 2014    Take these medicines the morning of surgery with A SIP OF WATER: amLODipine (NORVASC), if needed: acetaminophen (TYLENOL) for pain  Stop taking Aspirin, vitamins and herbal medications. Do not take any NSAIDs ie: Ibuprofen, Advil, Naproxen or any medication containing Aspirin; stop now.   Do not wear jewelry   Do not wear lotions, powders, or perfumes. You may not wear deodorant.    Men may shave face and neck.   Do not bring valuables to the hospital.  [facility] is not responsible for any belongings or valuables.               Contacts, dentures or bridgework may not be worn into surgery.   Leave suitcase in the car. After surgery it may be brought to your room.   For patients admitted to the hospital, discharge time is determined by your treatment team.               Patients discharged the day of surgery will not be allowed to drive home.   Name and phone number of your driver: with wife   Special Instructions:  Special Instructions:Special Instructions: [place] - Preparing for Surgery  Before surgery, you can play an important role.  Because skin is not sterile, your skin needs to be as free of germs as possible.  You can reduce the number of germs on you skin by washing with CHG (chlorahexidine gluconate) soap before surgery.  CHG is an antiseptic cleaner which kills germs and bonds with the skin to continue killing germs even after washing.  Please DO NOT use if you have an allergy to CHG or antibacterial soaps.  If your skin becomes reddened/irritated stop using the CHG and inform your nurse when you arrive at Short Stay.  Do not shave (including legs and  underarms) for at least 48 hours prior to the first CHG shower.  You may shave your face.  Please follow these instructions carefully:   1.  Shower with CHG Soap the night before surgery and the morning of Surgery.  2.  If you choose to wash your hair, wash your hair first as usual with your normal shampoo.  3.  After you shampoo, rinse your hair and body thoroughly to remove the Shampoo.  4.  Use CHG as you would any other liquid soap.  You can apply chg directly  to the skin and wash gently with scrungie or a clean washcloth.  5.  Apply the CHG Soap to your body ONLY FROM THE NECK DOWN.  Do not use on open wounds or open sores.  Avoid contact with your eyes, ears, mouth and genitals (private parts).  Wash genitals (private parts) with your normal soap.  6.  Wash thoroughly, paying special attention to the area where your surgery will be performed.  7.  Thoroughly rinse your body with warm water from the neck down.  8.  DO NOT shower/wash with your normal soap after using and rinsing off the CHG Soap.  9.  Pat yourself dry with a clean towel.            10.  Wear clean  pajamas.            11.  Place clean sheets on your bed the night of your first shower and do not sleep with pets.  Day of Surgery  Do not apply any lotions/deodorants the morning of surgery.  Please wear clean clothes to the hospital/surgery center.   Please read over the following fact sheets that you were given: Pain Booklet, Coughing and Deep Breathing, Blood Transfusion Information, MRSA Information and Surgical Site Infection Prevention

## 2014-12-10 NOTE — Progress Notes (Signed)
Pt. Denies cardiac problems, states he had a stress test 10 yrs.,- proved to be WNL.

## 2014-12-10 NOTE — Progress Notes (Signed)
Call to pharmacy tech. For med. Rec.

## 2014-12-10 NOTE — Progress Notes (Signed)
Call to PCP office, [facility] requested last EKG, it will be faxed to [phone]

## 2014-12-13 NOTE — Progress Notes (Signed)
Anesthesia Chart Review:  Pt is 69 year old male scheduled for L3-4 anterior lateral lumbar fusion, posterior lumbar fusion on 12/15/2014 with Dr. Lynann Bologna.   PCP is Dr. Lysle Rubens with [facility].   PMH includes: HTN, hyperlipidemia, GERD. Former smoker. BMI 29. S/p lumbar laminectomy/decompression microdiskectomy on 07/08/13.   Preoperative labs reviewed.    Chest x-ray reviewed. Lingular atelectasis or scarring. There is no alveolar pneumonia nor evidence of CHF.   EKG: Sinus bradycardia (52 bpm). Possible Inferior infarct, age undetermined. Possible Anterior infarct, age undetermined. Appears stable when compared to previous tracing dated 06/24/2013.   If no changes, I anticipate pt can proceed with surgery as scheduled.   Willeen Cass, FNP-BC [facility] Short Stay Surgical Center/Anesthesiology Phone: [phone] 12/13/2014 3:58 PM

## 2014-12-14 MED ORDER — CEFAZOLIN SODIUM-DEXTROSE 2-3 GM-% IV SOLR
2.0000 g | INTRAVENOUS | Status: AC
  Administered 2014-12-15: 2 g via INTRAVENOUS
  Filled 2014-12-14: qty 50

## 2014-12-14 MED ORDER — POVIDONE-IODINE 7.5 % EX SOLN
Freq: Once | CUTANEOUS | Status: DC
  Filled 2014-12-14: qty 118

## 2014-12-14 NOTE — Progress Notes (Signed)
I spoke with patient and instructed him of new arrival time of 1330.

## 2014-12-15 ENCOUNTER — Inpatient Hospital Stay (HOSPITAL_COMMUNITY): Payer: Medicare Other | Admitting: Certified Registered"

## 2014-12-15 ENCOUNTER — Inpatient Hospital Stay (HOSPITAL_COMMUNITY): Payer: Medicare Other | Admitting: Emergency Medicine

## 2014-12-15 ENCOUNTER — Inpatient Hospital Stay (HOSPITAL_COMMUNITY)
Admission: RE | Admit: 2014-12-15 | Discharge: 2014-12-17 | DRG: 455 | Disposition: A | Discharge status: Home / Self Care | Payer: Medicare Other | Source: Ambulatory Visit | Attending: Orthopedic Surgery | Admitting: Orthopedic Surgery

## 2014-12-15 ENCOUNTER — Inpatient Hospital Stay (HOSPITAL_COMMUNITY): Payer: Medicare Other

## 2014-12-15 ENCOUNTER — Encounter (HOSPITAL_COMMUNITY): Payer: Self-pay | Admitting: Certified Registered"

## 2014-12-15 ENCOUNTER — Encounter (HOSPITAL_COMMUNITY)
Admission: RE | Disposition: A | Discharge status: Home / Self Care | Payer: Medicare Other | Source: Ambulatory Visit | Attending: Orthopedic Surgery

## 2014-12-15 DIAGNOSIS — Z7982 Long term (current) use of aspirin: Secondary | ICD-10-CM

## 2014-12-15 DIAGNOSIS — Z87891 Personal history of nicotine dependence: Secondary | ICD-10-CM

## 2014-12-15 DIAGNOSIS — Z79899 Other long term (current) drug therapy: Secondary | ICD-10-CM

## 2014-12-15 DIAGNOSIS — M79605 Pain in left leg: Secondary | ICD-10-CM | POA: Diagnosis present

## 2014-12-15 DIAGNOSIS — M4806 Spinal stenosis, lumbar region: Secondary | ICD-10-CM | POA: Diagnosis present

## 2014-12-15 DIAGNOSIS — E785 Hyperlipidemia, unspecified: Secondary | ICD-10-CM | POA: Diagnosis present

## 2014-12-15 DIAGNOSIS — M5410 Radiculopathy, site unspecified: Secondary | ICD-10-CM | POA: Diagnosis present

## 2014-12-15 DIAGNOSIS — M4316 Spondylolisthesis, lumbar region: Secondary | ICD-10-CM | POA: Diagnosis present

## 2014-12-15 DIAGNOSIS — Z419 Encounter for procedure for purposes other than remedying health state, unspecified: Secondary | ICD-10-CM

## 2014-12-15 DIAGNOSIS — K219 Gastro-esophageal reflux disease without esophagitis: Secondary | ICD-10-CM | POA: Diagnosis present

## 2014-12-15 DIAGNOSIS — I10 Essential (primary) hypertension: Secondary | ICD-10-CM | POA: Diagnosis present

## 2014-12-15 HISTORY — PX: ANTERIOR LAT LUMBAR FUSION: SHX1168

## 2014-12-15 SURGERY — ANTERIOR LATERAL LUMBAR FUSION 1 LEVEL
Anesthesia: General | Site: Flank

## 2014-12-15 MED ORDER — BISACODYL 5 MG PO TBEC: 5.0000 mg | DELAYED_RELEASE_TABLET | Freq: Every day | ORAL | Status: DC | PRN

## 2014-12-15 MED ORDER — SENNOSIDES-DOCUSATE SODIUM 8.6-50 MG PO TABS: 1.0000 | ORAL_TABLET | Freq: Every evening | ORAL | Status: DC | PRN

## 2014-12-15 MED ORDER — HEMOSTATIC AGENTS (NO CHARGE) OPTIME
TOPICAL | Status: DC | PRN
  Administered 2014-12-15: 1 via TOPICAL

## 2014-12-15 MED ORDER — BUPIVACAINE-EPINEPHRINE (PF) 0.25% -1:200000 IJ SOLN
INTRAMUSCULAR | Status: AC
  Filled 2014-12-15: qty 30

## 2014-12-15 MED ORDER — BUPIVACAINE-EPINEPHRINE 0.25% -1:200000 IJ SOLN
INTRAMUSCULAR | Status: DC | PRN
  Administered 2014-12-15: 30 mL

## 2014-12-15 MED ORDER — MENTHOL 3 MG MT LOZG: 1.0000 | LOZENGE | OROMUCOSAL | Status: DC | PRN

## 2014-12-15 MED ORDER — DOCUSATE SODIUM 100 MG PO CAPS
100.0000 mg | ORAL_CAPSULE | Freq: Two times a day (BID) | ORAL | Status: DC
Start: 2014-12-15 — End: 2014-12-17
  Administered 2014-12-15 – 2014-12-16 (×3): 100 mg via ORAL
  Filled 2014-12-15 (×4): qty 1

## 2014-12-15 MED ORDER — GUAIFENESIN-CODEINE 100-10 MG/5ML PO SYRP: 5.0000 mL | ORAL_SOLUTION | ORAL | Status: DC | PRN

## 2014-12-15 MED ORDER — ROCURONIUM BROMIDE 50 MG/5ML IV SOLN
INTRAVENOUS | Status: AC
  Filled 2014-12-15: qty 1

## 2014-12-15 MED ORDER — AMLODIPINE BESYLATE 5 MG PO TABS
5.0000 mg | ORAL_TABLET | Freq: Every day | ORAL | Status: DC
  Administered 2014-12-16: 5 mg via ORAL
  Filled 2014-12-15 (×2): qty 1

## 2014-12-15 MED ORDER — ARTIFICIAL TEARS OP OINT
TOPICAL_OINTMENT | OPHTHALMIC | Status: DC | PRN
  Administered 2014-12-15: 1 via OPHTHALMIC

## 2014-12-15 MED ORDER — MIDAZOLAM HCL 5 MG/5ML IJ SOLN
INTRAMUSCULAR | Status: DC | PRN
  Administered 2014-12-15: 2 mg via INTRAVENOUS

## 2014-12-15 MED ORDER — SODIUM CHLORIDE 0.9 % IV SOLN
250.0000 mL | INTRAVENOUS | Status: DC
  Administered 2014-12-15: 250 mL via INTRAVENOUS

## 2014-12-15 MED ORDER — SUCCINYLCHOLINE CHLORIDE 20 MG/ML IJ SOLN
INTRAMUSCULAR | Status: AC
  Filled 2014-12-15: qty 1

## 2014-12-15 MED ORDER — FLUTICASONE PROPIONATE 50 MCG/ACT NA SUSP
1.0000 | Freq: Every day | NASAL | Status: DC
  Administered 2014-12-16: 1 via NASAL
  Filled 2014-12-15: qty 16

## 2014-12-15 MED ORDER — GLYCOPYRROLATE 0.2 MG/ML IJ SOLN
INTRAMUSCULAR | Status: AC
  Filled 2014-12-15: qty 3

## 2014-12-15 MED ORDER — NEOSTIGMINE METHYLSULFATE 10 MG/10ML IV SOLN
INTRAVENOUS | Status: AC
  Filled 2014-12-15: qty 4

## 2014-12-15 MED ORDER — THROMBIN 20000 UNITS EX SOLR
CUTANEOUS | Status: AC
  Filled 2014-12-15: qty 20000

## 2014-12-15 MED ORDER — HYDROMORPHONE HCL 1 MG/ML IJ SOLN
INTRAMUSCULAR | Status: AC
  Administered 2014-12-15: 0.5 mg via INTRAVENOUS
  Filled 2014-12-15: qty 1

## 2014-12-15 MED ORDER — DIAZEPAM 5 MG PO TABS: 5.0000 mg | ORAL_TABLET | Freq: Four times a day (QID) | ORAL | Status: DC | PRN

## 2014-12-15 MED ORDER — LACTATED RINGERS IV SOLN
INTRAVENOUS | Status: DC
  Administered 2014-12-15 (×2): via INTRAVENOUS

## 2014-12-15 MED ORDER — PHENYLEPHRINE 40 MCG/ML (10ML) SYRINGE FOR IV PUSH (FOR BLOOD PRESSURE SUPPORT)
PREFILLED_SYRINGE | INTRAVENOUS | Status: AC
  Filled 2014-12-15: qty 10

## 2014-12-15 MED ORDER — DEXAMETHASONE SODIUM PHOSPHATE 4 MG/ML IJ SOLN
INTRAMUSCULAR | Status: DC | PRN
  Administered 2014-12-15: 4 mg via INTRAVENOUS

## 2014-12-15 MED ORDER — ONDANSETRON HCL 4 MG/2ML IJ SOLN
INTRAMUSCULAR | Status: AC
  Filled 2014-12-15: qty 2

## 2014-12-15 MED ORDER — 0.9 % SODIUM CHLORIDE (POUR BTL) OPTIME
TOPICAL | Status: DC | PRN
Start: 2014-12-15 — End: 2014-12-15
  Administered 2014-12-15: 1000 mL

## 2014-12-15 MED ORDER — GLYCOPYRROLATE 0.2 MG/ML IJ SOLN
INTRAMUSCULAR | Status: AC
  Filled 2014-12-15: qty 1

## 2014-12-15 MED ORDER — MIDAZOLAM HCL 2 MG/2ML IJ SOLN
INTRAMUSCULAR | Status: AC
  Filled 2014-12-15: qty 2

## 2014-12-15 MED ORDER — FENTANYL CITRATE (PF) 100 MCG/2ML IJ SOLN
INTRAMUSCULAR | Status: DC | PRN
  Administered 2014-12-15: 150 ug via INTRAVENOUS
  Administered 2014-12-15 (×3): 50 ug via INTRAVENOUS
  Administered 2014-12-15: 100 ug via INTRAVENOUS

## 2014-12-15 MED ORDER — THROMBIN 20000 UNITS EX KIT
PACK | CUTANEOUS | Status: DC | PRN
Start: 2014-12-15 — End: 2014-12-15
  Administered 2014-12-15: 20000 [IU] via TOPICAL

## 2014-12-15 MED ORDER — CEFAZOLIN SODIUM 1-5 GM-% IV SOLN
1.0000 g | Freq: Three times a day (TID) | INTRAVENOUS | Status: AC
  Administered 2014-12-15 – 2014-12-16 (×2): 1 g via INTRAVENOUS
  Filled 2014-12-15 (×2): qty 50

## 2014-12-15 MED ORDER — FLEET ENEMA 7-19 GM/118ML RE ENEM: 1.0000 | ENEMA | Freq: Once | RECTAL | Status: AC | PRN

## 2014-12-15 MED ORDER — METHYLENE BLUE 1 % INJ SOLN
INTRAMUSCULAR | Status: DC | PRN
  Administered 2014-12-15: 10 mL

## 2014-12-15 MED ORDER — LIDOCAINE HCL (CARDIAC) 20 MG/ML IV SOLN
INTRAVENOUS | Status: DC | PRN
  Administered 2014-12-15: 30 mg via INTRAVENOUS

## 2014-12-15 MED ORDER — BENAZEPRIL-HYDROCHLOROTHIAZIDE 20-25 MG PO TABS: 1.0000 | ORAL_TABLET | Freq: Every day | ORAL | Status: DC

## 2014-12-15 MED ORDER — PROPOFOL INFUSION 10 MG/ML OPTIME
INTRAVENOUS | Status: DC | PRN
  Administered 2014-12-15 (×2): 50 ug/kg/min via INTRAVENOUS

## 2014-12-15 MED ORDER — SODIUM CHLORIDE 0.9 % IJ SOLN
3.0000 mL | Freq: Two times a day (BID) | INTRAMUSCULAR | Status: DC
  Administered 2014-12-15 – 2014-12-16 (×3): 3 mL via INTRAVENOUS

## 2014-12-15 MED ORDER — FENTANYL CITRATE (PF) 250 MCG/5ML IJ SOLN
INTRAMUSCULAR | Status: AC
  Filled 2014-12-15: qty 5

## 2014-12-15 MED ORDER — PROMETHAZINE HCL 25 MG/ML IJ SOLN: 6.2500 mg | INTRAMUSCULAR | Status: DC | PRN

## 2014-12-15 MED ORDER — ACETAMINOPHEN 650 MG RE SUPP: 650.0000 mg | RECTAL | Status: DC | PRN

## 2014-12-15 MED ORDER — GLYCOPYRROLATE 0.2 MG/ML IJ SOLN
INTRAMUSCULAR | Status: DC | PRN
  Administered 2014-12-15: 0.2 mg via INTRAVENOUS

## 2014-12-15 MED ORDER — PROPOFOL 10 MG/ML IV BOLUS
INTRAVENOUS | Status: DC | PRN
  Administered 2014-12-15: 50 mg via INTRAVENOUS
  Administered 2014-12-15: 150 mg via INTRAVENOUS

## 2014-12-15 MED ORDER — ZOLPIDEM TARTRATE 5 MG PO TABS: 5.0000 mg | ORAL_TABLET | Freq: Every evening | ORAL | Status: DC | PRN

## 2014-12-15 MED ORDER — PHENYLEPHRINE HCL 10 MG/ML IJ SOLN
10.0000 mg | INTRAVENOUS | Status: DC | PRN
  Administered 2014-12-15: 50 ug/min via INTRAVENOUS

## 2014-12-15 MED ORDER — ARTIFICIAL TEARS OP OINT
TOPICAL_OINTMENT | OPHTHALMIC | Status: AC
  Filled 2014-12-15: qty 3.5

## 2014-12-15 MED ORDER — EPHEDRINE SULFATE 50 MG/ML IJ SOLN
INTRAMUSCULAR | Status: AC
  Filled 2014-12-15: qty 1

## 2014-12-15 MED ORDER — ACETAMINOPHEN 325 MG PO TABS: 650.0000 mg | ORAL_TABLET | ORAL | Status: DC | PRN

## 2014-12-15 MED ORDER — HYDROMORPHONE HCL 1 MG/ML IJ SOLN
0.2500 mg | INTRAMUSCULAR | Status: DC | PRN
  Administered 2014-12-15: 0.5 mg via INTRAVENOUS

## 2014-12-15 MED ORDER — PHENYLEPHRINE HCL 10 MG/ML IJ SOLN
INTRAMUSCULAR | Status: DC | PRN
  Administered 2014-12-15: 120 ug via INTRAVENOUS
  Administered 2014-12-15: 80 ug via INTRAVENOUS
  Administered 2014-12-15: 200 ug via INTRAVENOUS

## 2014-12-15 MED ORDER — PHENOL 1.4 % MT LIQD: 1.0000 | OROMUCOSAL | Status: DC | PRN

## 2014-12-15 MED ORDER — VITAMIN B-12 100 MCG PO TABS
50.0000 ug | ORAL_TABLET | Freq: Every day | ORAL | Status: DC
  Administered 2014-12-16: 50 ug via ORAL
  Filled 2014-12-15 (×2): qty 1

## 2014-12-15 MED ORDER — ALUM & MAG HYDROXIDE-SIMETH 200-200-20 MG/5ML PO SUSP: 30.0000 mL | Freq: Four times a day (QID) | ORAL | Status: DC | PRN

## 2014-12-15 MED ORDER — GUAIFENESIN-CODEINE 100-10 MG/5ML PO SOLN: 5.0000 mL | ORAL | Status: DC | PRN

## 2014-12-15 MED ORDER — ROSUVASTATIN CALCIUM 10 MG PO TABS
10.0000 mg | ORAL_TABLET | Freq: Every day | ORAL | Status: DC
  Administered 2014-12-16: 10 mg via ORAL
  Filled 2014-12-15 (×2): qty 1

## 2014-12-15 MED ORDER — SODIUM CHLORIDE 0.9 % IJ SOLN: 3.0000 mL | INTRAMUSCULAR | Status: DC | PRN

## 2014-12-15 MED ORDER — LIDOCAINE HCL (CARDIAC) 20 MG/ML IV SOLN
INTRAVENOUS | Status: AC
  Filled 2014-12-15: qty 5

## 2014-12-15 MED ORDER — ETOMIDATE 2 MG/ML IV SOLN
INTRAVENOUS | Status: AC
  Filled 2014-12-15: qty 10

## 2014-12-15 MED ORDER — METHYLENE BLUE 1 % INJ SOLN
INTRAMUSCULAR | Status: AC
  Filled 2014-12-15: qty 10

## 2014-12-15 MED ORDER — FAMOTIDINE 20 MG PO TABS
20.0000 mg | ORAL_TABLET | Freq: Every day | ORAL | Status: DC
  Administered 2014-12-16: 20 mg via ORAL
  Filled 2014-12-15 (×2): qty 1

## 2014-12-15 MED ORDER — PROPOFOL 10 MG/ML IV BOLUS
INTRAVENOUS | Status: AC
  Filled 2014-12-15: qty 20

## 2014-12-15 MED ORDER — MORPHINE SULFATE 2 MG/ML IJ SOLN: 1.0000 mg | INTRAMUSCULAR | Status: DC | PRN

## 2014-12-15 MED ORDER — OXYCODONE-ACETAMINOPHEN 5-325 MG PO TABS
1.0000 | ORAL_TABLET | ORAL | Status: DC | PRN
  Administered 2014-12-15: 1 via ORAL
  Administered 2014-12-16 – 2014-12-17 (×6): 2 via ORAL
  Filled 2014-12-15: qty 1
  Filled 2014-12-15 (×6): qty 2

## 2014-12-15 MED ORDER — HYDROCHLOROTHIAZIDE 25 MG PO TABS
25.0000 mg | ORAL_TABLET | Freq: Every day | ORAL | Status: DC
  Administered 2014-12-16: 25 mg via ORAL
  Filled 2014-12-15 (×2): qty 1

## 2014-12-15 MED ORDER — SODIUM CHLORIDE 0.9 % IJ SOLN
INTRAMUSCULAR | Status: AC
  Filled 2014-12-15: qty 10

## 2014-12-15 MED ORDER — ONDANSETRON HCL 4 MG/2ML IJ SOLN: 4.0000 mg | INTRAMUSCULAR | Status: DC | PRN

## 2014-12-15 MED ORDER — SUCCINYLCHOLINE CHLORIDE 20 MG/ML IJ SOLN
INTRAMUSCULAR | Status: DC | PRN
  Administered 2014-12-15: 160 mg via INTRAVENOUS

## 2014-12-15 MED ORDER — BENAZEPRIL HCL 20 MG PO TABS
20.0000 mg | ORAL_TABLET | Freq: Every day | ORAL | Status: DC
  Administered 2014-12-16: 20 mg via ORAL
  Filled 2014-12-15 (×2): qty 1

## 2014-12-15 SURGICAL SUPPLY — 95 items
APL SKNCLS STERI-STRIP NONHPOA (GAUZE/BANDAGES/DRESSINGS) ×4
BENZOIN TINCTURE PRP APPL 2/3 (GAUZE/BANDAGES/DRESSINGS) ×4 IMPLANT
BLADE SURG 10 STRL SS (BLADE) ×3 IMPLANT
BLADE SURG ROTATE 9660 (MISCELLANEOUS) ×1 IMPLANT
BUR PRESCISION 1.7 ELITE (BURR) IMPLANT
BUR ROUND PRECISION 4.0 (BURR) ×3 IMPLANT
CAGE COROENT XL 14X18X55 (Cage) ×1 IMPLANT
CARTRIDGE OIL MAESTRO DRILL (MISCELLANEOUS) ×2 IMPLANT
CONT SPEC STER OR (MISCELLANEOUS) ×3 IMPLANT
COVER BACK TABLE 80X110 HD (DRAPES) ×3 IMPLANT
COVER MAYO STAND STRL (DRAPES) ×6 IMPLANT
COVER SURGICAL LIGHT HANDLE (MISCELLANEOUS) ×3 IMPLANT
DIFFUSER DRILL AIR PNEUMATIC (MISCELLANEOUS) ×2 IMPLANT
DRAIN CHANNEL 15F RND FF W/TCR (WOUND CARE) IMPLANT
DRAPE C-ARM 42X72 X-RAY (DRAPES) ×3 IMPLANT
DRAPE C-ARMOR (DRAPES) ×3 IMPLANT
DRAPE POUCH INSTRU U-SHP 10X18 (DRAPES) ×3 IMPLANT
DRAPE SURG 17X23 STRL (DRAPES) ×14 IMPLANT
DURAPREP 26ML APPLICATOR (WOUND CARE) ×3 IMPLANT
ELECT BLADE 4.0 EZ CLEAN MEGAD (MISCELLANEOUS) ×3
ELECT BLADE 6.5 EXT (BLADE) ×3 IMPLANT
ELECT CAUTERY BLADE 6.4 (BLADE) ×3 IMPLANT
ELECT REM PT RETURN 9FT ADLT (ELECTROSURGICAL) ×3
ELECTRODE BLDE 4.0 EZ CLN MEGD (MISCELLANEOUS) ×2 IMPLANT
ELECTRODE REM PT RTRN 9FT ADLT (ELECTROSURGICAL) ×2 IMPLANT
EVACUATOR SILICONE 100CC (DRAIN) IMPLANT
GAUZE SPONGE 4X4 12PLY STRL (GAUZE/BANDAGES/DRESSINGS) ×4 IMPLANT
GAUZE SPONGE 4X4 16PLY XRAY LF (GAUZE/BANDAGES/DRESSINGS) ×9 IMPLANT
GLOVE BIO SURGEON STRL SZ7 (GLOVE) ×6 IMPLANT
GLOVE BIO SURGEON STRL SZ8 (GLOVE) ×3 IMPLANT
GLOVE BIOGEL PI IND STRL 7.0 (GLOVE) ×2 IMPLANT
GLOVE BIOGEL PI IND STRL 8 (GLOVE) ×2 IMPLANT
GLOVE BIOGEL PI INDICATOR 7.0 (GLOVE) ×1
GLOVE BIOGEL PI INDICATOR 8 (GLOVE) ×1
GOWN STRL REUS W/ TWL LRG LVL3 (GOWN DISPOSABLE) ×4 IMPLANT
GOWN STRL REUS W/ TWL XL LVL3 (GOWN DISPOSABLE) ×4 IMPLANT
GOWN STRL REUS W/TWL LRG LVL3 (GOWN DISPOSABLE) ×6
GOWN STRL REUS W/TWL XL LVL3 (GOWN DISPOSABLE) ×6
GUIDEWIRE SHARP VIPER II (WIRE) ×2 IMPLANT
IV CATH 14GX2 1/4 (CATHETERS) ×3 IMPLANT
KIT BASIN OR (CUSTOM PROCEDURE TRAY) ×3 IMPLANT
KIT DILATOR XLIF 5 (KITS) IMPLANT
KIT NDL NVM5 EMG ELECT (KITS) IMPLANT
KIT NEEDLE NVM5 EMG ELECT (KITS) ×2 IMPLANT
KIT NEEDLE NVM5 EMG ELECTRODE (KITS) ×1
KIT POSITION SURG JACKSON T1 (MISCELLANEOUS) ×3 IMPLANT
KIT ROOM TURNOVER OR (KITS) ×3 IMPLANT
KIT SURGICAL ACCESS MAXCESS 4 (KITS) ×1 IMPLANT
KIT XLIF (KITS) ×1
MARKER SKIN DUAL TIP RULER LAB (MISCELLANEOUS) ×3 IMPLANT
MIX DBX 10CC 35% BONE (Bone Implant) ×1 IMPLANT
NDL HYPO 25GX1X1/2 BEV (NEEDLE) ×2 IMPLANT
NDL JAMSHIDI VIPER (NEEDLE) IMPLANT
NDL SAFETY ECLIPSE 18X1.5 (NEEDLE) ×2 IMPLANT
NDL SPNL 18GX3.5 QUINCKE PK (NEEDLE) ×4 IMPLANT
NEEDLE 22X1 1/2 (OR ONLY) (NEEDLE) ×3 IMPLANT
NEEDLE BONE MARROW 8GX6 FENEST (NEEDLE) IMPLANT
NEEDLE HYPO 18GX1.5 SHARP (NEEDLE) ×3
NEEDLE HYPO 25GX1X1/2 BEV (NEEDLE) ×3 IMPLANT
NEEDLE JAMSHIDI VIPER (NEEDLE) ×6 IMPLANT
NEEDLE SPNL 18GX3.5 QUINCKE PK (NEEDLE) ×6 IMPLANT
NS IRRIG 1000ML POUR BTL (IV SOLUTION) ×6 IMPLANT
OIL CARTRIDGE MAESTRO DRILL (MISCELLANEOUS)
PACK LAMINECTOMY ORTHO (CUSTOM PROCEDURE TRAY) ×3 IMPLANT
PACK UNIVERSAL I (CUSTOM PROCEDURE TRAY) ×3 IMPLANT
PAD ARMBOARD 7.5X6 YLW CONV (MISCELLANEOUS) ×6 IMPLANT
PATTIES SURGICAL .5 X1 (DISPOSABLE) ×3 IMPLANT
PATTIES SURGICAL .5X1.5 (GAUZE/BANDAGES/DRESSINGS) ×2 IMPLANT
ROD VIPER2 5.5X45 PRE LARDOSED (Rod) ×1 IMPLANT
SCREW SET SINGLE INNER MIS (Screw) ×2 IMPLANT
SCREW XTAB POLY VIPER  6X45 (Screw) ×2 IMPLANT
SCREW XTAB POLY VIPER 6X45 (Screw) IMPLANT
SPONGE INTESTINAL PEANUT (DISPOSABLE) ×6 IMPLANT
SPONGE LAP 4X18 X RAY DECT (DISPOSABLE) ×3 IMPLANT
SPONGE SURGIFOAM ABS GEL 100 (HEMOSTASIS) ×3 IMPLANT
STAPLER VISISTAT 35W (STAPLE) ×3 IMPLANT
STRIP CLOSURE SKIN 1/2X4 (GAUZE/BANDAGES/DRESSINGS) ×6 IMPLANT
SURGIFLO TRUKIT (HEMOSTASIS) IMPLANT
SUT MNCRL AB 4-0 PS2 18 (SUTURE) ×6 IMPLANT
SUT VIC AB 0 CT1 18XCR BRD 8 (SUTURE) ×2 IMPLANT
SUT VIC AB 0 CT1 8-18 (SUTURE) ×3
SUT VIC AB 1 CT1 18XCR BRD 8 (SUTURE) ×4 IMPLANT
SUT VIC AB 1 CT1 8-18 (SUTURE) ×6
SUT VIC AB 2-0 CT2 18 VCP726D (SUTURE) ×4 IMPLANT
SYR 20CC LL (SYRINGE) ×3 IMPLANT
SYR BULB IRRIGATION 50ML (SYRINGE) ×3 IMPLANT
SYR CONTROL 10ML LL (SYRINGE) ×6 IMPLANT
SYR TB 1ML LUER SLIP (SYRINGE) ×3 IMPLANT
TAP CANN VIPER2 DL 6.0 (TAP) ×1 IMPLANT
TOWEL OR 17X24 6PK STRL BLUE (TOWEL DISPOSABLE) ×3 IMPLANT
TOWEL OR 17X26 10 PK STRL BLUE (TOWEL DISPOSABLE) ×3 IMPLANT
TRAY FOLEY CATH 16FR SILVER (SET/KITS/TRAYS/PACK) ×3 IMPLANT
TRAY FOLEY CATH 16FRSI W/METER (SET/KITS/TRAYS/PACK) ×3 IMPLANT
WATER STERILE IRR 1000ML POUR (IV SOLUTION) ×2 IMPLANT
YANKAUER SUCT BULB TIP NO VENT (SUCTIONS) ×3 IMPLANT

## 2014-12-15 NOTE — Anesthesia Procedure Notes (Signed)
Procedure Name: Intubation Date/Time: 12/15/2014 4:26 PM Performed by: Barrington Ellison Pre-anesthesia Checklist: Patient identified, Emergency Drugs available, Suction available, Patient being monitored and Timeout performed Patient Re-evaluated:Patient Re-evaluated prior to inductionOxygen Delivery Method: Circle system utilized Preoxygenation: Pre-oxygenation with 100% oxygen Intubation Type: IV induction Ventilation: Mask ventilation without difficulty Laryngoscope Size: Mac and 3 Grade View: Grade II Tube type: Oral Tube size: 7.5 mm Number of attempts: 1 Airway Equipment and Method: Stylet Placement Confirmation: ETT inserted through vocal cords under direct vision,  positive ETCO2 and breath sounds checked- equal and bilateral Secured at: 25 cm Tube secured with: Tape Dental Injury: Teeth and Oropharynx as per pre-operative assessment

## 2014-12-15 NOTE — Transfer of Care (Signed)
Immediate Anesthesia Transfer of Care Note  Patient: [name] [name]  Procedure(s) Performed: Procedure(s) with comments: ANTERIOR LATERAL LUMBAR FUSION 1 LEVEL (Left) - Left sided lateral lumbar interbody fusion, lumbar 3-4, posterior spinal fusion, lumbar 3-4 with instrumentation. POSTERIOR LUMBAR FUSION 1 LEVEL (N/A)  Patient Location: PACU  Anesthesia Type:General  Level of Consciousness: sedated and patient cooperative  Airway & Oxygen Therapy: Patient Spontanous Breathing and Patient connected to face mask oxygen  Post-op Assessment: Report given to RN and Post -op Vital signs reviewed and stable  Post vital signs: Reviewed and stable  Last Vitals:  Filed Vitals:   12/15/14 1315  BP: 152/99  Pulse: 56  Temp: 36.5 C  Resp: 20    Complications: No apparent anesthesia complications

## 2014-12-15 NOTE — Anesthesia Preprocedure Evaluation (Addendum)
Anesthesia Evaluation  Patient identified by MRN, date of birth, ID band Patient awake    Reviewed: Allergy & Precautions, NPO status , Patient's Chart, lab work & pertinent test results  History of Anesthesia Complications Negative for: history of anesthetic complications  Airway Mallampati: II  TM Distance: >3 FB Neck ROM: Full    Dental no notable dental hx. (+) Teeth Intact, Dental Advisory Given, Missing   Pulmonary neg pulmonary ROS, former smoker,  breath sounds clear to auscultation  Pulmonary exam normal       Cardiovascular hypertension, Pt. on medications Rhythm:Regular Rate:Normal     Neuro/Psych Chronic back pain negative psych ROS   GI/Hepatic Neg liver ROS, GERD-  Medicated and Controlled,  Endo/Other  negative endocrine ROS  Renal/GU Renal InsufficiencyRenal disease (creat 1.42)  negative genitourinary   Musculoskeletal negative musculoskeletal ROS (+)   Abdominal   Peds negative pediatric ROS (+)  Hematology negative hematology ROS (+)   Anesthesia Other Findings   Reproductive/Obstetrics negative OB ROS                          Anesthesia Physical Anesthesia Plan  ASA: II  Anesthesia Plan: General   Post-op Pain Management:    Induction: Intravenous  Airway Management Planned: Oral ETT  Additional Equipment:   Intra-op Plan:   Post-operative Plan: Extubation in OR  Informed Consent: I have reviewed the patients History and Physical, chart, labs and discussed the procedure including the risks, benefits and alternatives for the proposed anesthesia with the patient or authorized representative who has indicated his/her understanding and acceptance.   Dental advisory given  Plan Discussed with: CRNA and Surgeon  Anesthesia Plan Comments: (Plan routine monitors, GETA )       Anesthesia Quick Evaluation

## 2014-12-15 NOTE — H&P (Signed)
PREOPERATIVE H&P  Chief Complaint: bilateral leg pain  HPI: [name] [name] is a 69 y.o. 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 Patient is s/p previous L3/4 decompression  Patient has failed multiple forms of conservative care and continues to have pain (see office notes for additional details regarding the patient's full course of treatment)  Past Medical History  Diagnosis Date  . Hypertension     takes Amlodipine daily and Lotensin as well  . Hyperlipidemia     takes Crestor daily  . Chronic back pain     stenosis  . GERD (gastroesophageal reflux disease)     occasionally will take a zantac(maybe once a month)  . History of colon polyps   . Enlarged prostate   . History of kidney stones   . History of stress test     done 10 yrs. ago, as a baseline    Past Surgical History  Procedure Laterality Date  . Tonsillectomy      as a child  . Fatty tissue removed from stomach    . Colonoscopy    . Esophagogastroduodenoscopy    . Right ankle surgery      as child   . Lumbar laminectomy/decompression microdiscectomy N/A 07/08/2013    Procedure: LUMBAR LAMINECTOMY/DECOMPRESSION MICRODISCECTOMY;  Surgeon: Sinclair Ship, MD;  Location: [facility];  Service: Orthopedics;  Laterality: N/A;  Lumbar 3-4, lumbar 4-5 decompression   History   Social History  . Marital Status: Married    Spouse Name: N/A  . Number of Children: N/A  . Years of Education: N/A   Social History Main Topics  . Smoking status: Former Smoker -- 0.25 packs/day for 40 years    Types: Cigarettes  . Smokeless tobacco: Not on file     Comment: none since 06/24/13  . Alcohol Use: No  . Drug Use: No  . Sexual Activity: Not on file   Other Topics Concern  . Not on file   Social History Narrative   No family history on file. Allergies  Allergen Reactions  . Lyrica [Pregabalin] Other (See Comments)    Hallucinations, dizzy, nausea  . Sulfa  Antibiotics Other (See Comments)    Unknown reaction    Prior to Admission medications   Medication Sig Start Date End Date Taking? Authorizing Provider  amLODipine (NORVASC) 5 MG tablet Take 5 mg by mouth daily.   Yes Historical Provider, MD  aspirin EC 81 MG tablet Take 81 mg by mouth daily.   Yes Historical Provider, MD  benazepril-hydrochlorthiazide (LOTENSIN HCT) 20-25 MG per tablet Take 1 tablet by mouth daily.   Yes Historical Provider, MD  CALCIUM PO Take 1 tablet by mouth daily.   Yes Historical Provider, MD  CHERATUSSIN AC 100-10 MG/5ML syrup Take 5 mLs by mouth every 4 (four) hours as needed for cough.  11/30/14  Yes Historical Provider, MD  Cyanocobalamin (VITAMIN B-12 PO) Take 1 tablet by mouth daily.   Yes Historical Provider, MD  mometasone (NASONEX) 50 MCG/ACT nasal spray Place 2 sprays into the nose daily as needed (congestion).   Yes Historical Provider, MD  Multiple Vitamins-Minerals (MULTIVITAL PO) Take 1 tablet by mouth daily.   Yes Historical Provider, MD  ranitidine (ZANTAC) 150 MG tablet Take 150 mg by mouth daily as needed for heartburn.   Yes Historical Provider, MD  rosuvastatin (CRESTOR) 10 MG tablet Take 10 mg by mouth daily.   Yes Historical  Provider, MD  tadalafil (CIALIS) 20 MG tablet Take 20 mg by mouth daily as needed for erectile dysfunction.   Yes Historical Provider, MD     All other systems have been reviewed and were otherwise negative with the exception of those mentioned in the HPI and as above.  Physical Exam: There were no vitals filed for this visit.  General: Alert, no acute distress Cardiovascular: No pedal edema Respiratory: No cyanosis, no use of accessory musculature Skin: No lesions in the area of chief complaint Neurologic: Sensation intact distally Psychiatric: Patient is competent for consent with normal mood and affect Lymphatic: No axillary or cervical lymphadenopathy   Assessment/Plan: Bilateral leg pain Plan for  Procedure(s): ANTERIOR LATERAL LUMBAR FUSION 1 LEVEL POSTERIOR LUMBAR FUSION 1 LEVEL   Sinclair Ship, MD 12/15/2014 8:04 AM

## 2014-12-16 ENCOUNTER — Encounter (HOSPITAL_COMMUNITY): Payer: Self-pay | Admitting: Orthopedic Surgery

## 2014-12-16 MED FILL — Thrombin For Soln 20000 Unit: CUTANEOUS | Qty: 1 | Status: AC

## 2014-12-16 NOTE — Op Note (Signed)
NAMETYREES, CHOPIN NO.:  [id number]  MEDICAL RECORD NO.:  [id number]  LOCATION:  4N10C                        FACILITY:  [facility]  PHYSICIAN:  Phylliss Bob, MD      DATE OF BIRTH:  04-09-1946  DATE OF PROCEDURE:  12/15/2014                              OPERATIVE REPORT   PREOPERATIVE DIAGNOSES: 1. L3-4 spinal stenosis. 2. 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 3. Status post previous L3-4 decompression.  POSTOPERATIVE DIAGNOSES: 1. L3-4 spinal stenosis. 2. 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 3. Status post previous L3-4 decompression.  PROCEDURE: 1. Anterior lumbar interbody fusion, L3-4 via a lateral interbody     approach. 2. Insertion of interbody device x1 (14 x 18 x 55 mm NuVasive     intervertebral spacer). 3. Use of morselized allograft. 4. Posterior spinal fusion, L3-4. 5. Placement of posterior instrumentation, L3-4.  SURGEON:  Phylliss Bob, MD  ASSISTANT:  Pricilla Holm, PA-C.  ANESTHESIA:  General endotracheal anesthesia.  COMPLICATIONS:  None.  DISPOSITION:  Stable.  ESTIMATED BLOOD LOSS:  Minimal.  INDICATIONS FOR SURGERY:  Briefly, [name] [name] is a very pleasant 69 year old male, who is status post a previous L3-4 decompressive procedure by me.  The patient did extremely well after that surgery, however, did develop a dynamic spondylolisthesis at L3-4 with symptoms associated with neurogenic claudication.  He was also noted to have prominent neural foraminal stenosis at L3-4.  He did fail multiple forms of conservative care, and we did discuss proceeding with the procedure reflected above.  The patient was fully aware of the risks and limitations of the procedure, and he did elect to proceed.  OPERATIVE DETAILS:  On December 15, 2014, the patient was brought to surgery and general endotracheal anesthesia was administered.  The patient was placed in the lateral decubitus position with the left side up.  The patient was  secured to the bed and all bony prominences were meticulously padded.  I did appropriately position the patient in order to ensure a perfect AP and lateral fluoroscopic images.  The bed was flexed in order to optimize the exposure to the L3-4 intervertebral space.  After time-out procedure was performed, after prepping the back, I did make a left-sided transverse incision overlying the L3-4 intervertebral space.  The external and internal oblique musculature was dissected, as was the transversalis fascia.  A retroperitoneal space was readily noted.  The peritoneum was bluntly swept anteriorly.  I then placed the initial dilator using neurologic monitoring.  It was noted that there were neurologic structures in the mid aspect of the psoas, however, there were no neurologic structures noted in the vicinity of the docking point.  I then sequentially dilated.  I then placed a self- retaining retractor, which was anchored to the bed.  The retractor was slightly opened.  I then explored the intervertebral space for any neurologic structures and there were none noted.  I did use triggered EMG to test around the vicinity of the exposure and there were no neurologic structures immediately in the vicinity of the exposure. Using a 15 blade knife, I did perform an annulotomy at the lateral aspect of the intervertebral disc.  I then  performed a standard and complete intervertebral discectomy.  I was very pleased with the discectomy.  The endplates were then prepared.  I then placed a series of intervertebral spacer trials and I did feel that a 14-mm intervertebral spacer would be the most appropriate fit.  The appropriate-sized intervertebral spacer was then packed with DBX mix and tamped into position.  I was very pleased with the height restoration that I was able to achieve.  I then slowly removed the distractor and there was no undue bleeding noted.  The fascia was then closed using #1 Vicryl, and  the skin was closed using 2-0 Vicryl, followed by 3-0 Monocryl.  I then made a paramedian incision just to the left lateral aspect of the pedicles at L3 and L4.  The posterolateral gutter and the L3 and L4 transverse processes were identified and decorticated and DBX mix was packed into the posterolateral gutter to help aid in the success of the fusion.  I then cannulated the L3 and L4 pedicles in the usual fashion using AP and lateral fluoroscopy.  I then used a 6-mm tap and I advanced 7 x 45 mm screws across the L3 and L4 pedicles.  I then placed a 45-mm rod, which was secured into the tulip heads of the screws.  Caps were placed and a final locking procedure was performed.  I was very pleased with the final AP and lateral fluoroscopic images.  The wound was then copiously irrigated.  The wound was then closed in layers using #1 Vicryl, followed by 2-0 Vicryl, followed by 3-0 Monocryl.  Benzoin and Steri-Strips were applied, followed by sterile dressing.  All instrument counts were correct at the termination of the procedure.  Of note, Pricilla Holm was my assistant throughout surgery, and did aid in retraction, suctioning, and closure throughout the entire surgery.     Phylliss Bob, MD     MD/MEDQ  D:  12/15/2014  T:  12/16/2014  Job:  [id number]  cc:   Wenda Low, MD

## 2014-12-16 NOTE — Progress Notes (Signed)
CARE MANAGEMENT NOTE 12/16/2014  Patient:  [name] [name], [name] [name]   Account Number:  [id number]  Date Initiated:  12/16/2014  Documentation initiated by:  Lorne Skeens  Subjective/Objective Assessment:   Patient was admitted for a lumbar fusion.  Lives at home with spouse.     Action/Plan:   Will follow for discharge needs pending PT/OT evals and physician orders.   Anticipated DC Date:  12/17/2014   Anticipated DC Plan:  HOME/SELF CARE         Choice offered to / List presented to:             Status of service:  In process, will continue to follow Medicare Important Message given?   (If response is "NO", the following Medicare IM given date fields will be blank) Date Medicare IM given:   Medicare IM given by:   Date Additional Medicare IM given:   Additional Medicare IM given by:    Discharge Disposition:    Per UR Regulation:  Reviewed for med. necessity/level of care/duration of stay  If discussed at [place] of Stay Meetings, dates discussed:    Comments:

## 2014-12-16 NOTE — Progress Notes (Signed)
Nutrition Brief Note  Patient identified on the Malnutrition Screening Tool (MST) Report  Wt Readings from Last 15 Encounters:  12/15/14 215 lb 9.8 oz (97.8 kg)    Body mass index is 30.08 kg/(m^2). Patient meets criteria for Obesity based on current BMI.   Current diet order is Heart Healthy, patient is consuming approximately 75% of meals at this time. Pt reports having a good appetite and eating well PTA. He reports losing a few pounds related to his efforts to lose weight by watching his portion sizes. He denies any nutrition related questions or concerns at this time. RD encouraged healthful PO intake. Labs and medications reviewed.   No nutrition interventions warranted at this time. If nutrition issues arise, please consult RD.   Pryor Ochoa RD, LDN Inpatient Clinical Dietitian Pager: [phone] After Hours Pager: [phone]

## 2014-12-16 NOTE — Anesthesia Postprocedure Evaluation (Signed)
  Anesthesia Post-op Note  Patient: Daesean Lazarz Dansby  Procedure(s) Performed: Procedure(s) with comments: ANTERIOR LATERAL LUMBAR FUSION 1 LEVEL (Left) - Left sided lateral lumbar interbody fusion, lumbar 3-4, posterior spinal fusion, lumbar 3-4 with instrumentation. POSTERIOR LUMBAR FUSION 1 LEVEL (N/A)  Patient Location: PACU  Anesthesia Type:General  Level of Consciousness: awake, alert  and oriented  Airway and Oxygen Therapy: Patient Spontanous Breathing and Patient connected to nasal cannula oxygen  Post-op Pain: mild  Post-op Assessment: Post-op Vital signs reviewed, Patient's Cardiovascular Status Stable, Respiratory Function Stable, Patent Airway and Pain level controlled  Post-op Vital Signs: stable  Last Vitals:  Filed Vitals:   12/16/14 0145  BP: 135/93  Pulse: 85  Temp: 37.1 C  Resp: 18    Complications: No apparent anesthesia complications

## 2014-12-16 NOTE — Evaluation (Signed)
Physical Therapy Evaluation Patient Details Name: [name] [name] MRN: [id number] DOB: 25-Aug-1945 Today's Date: 12/16/2014   History of Present Illness  Patient is a 69 yo male s/p Left sided lateral lumbar interbody fusion, lumbar 3-4, posterior spinal fusion, lumbar 3-4 with instrumentatio. PMH includes: HTN, hyperlipidemia, GERD. Former smoker. BMI 29. S/p lumbar laminectomy/decompression microdiskectomy on 07/08/13.   Clinical Impression  Patient demonstrates deficits in functional mobility as indicated below. Will need continued skilled PT to address deficits and maximize function. Will see as indicated and progress as tolerated. Educated patient on precautions, positional changes, pain management, and mobility expectations.     Follow Up Recommendations No PT follow up;Supervision/Assistance - 24 hour (initially)    Equipment Recommendations  Rolling walker with 5" wheels    Recommendations for Other Services       Precautions / Restrictions Precautions Precautions: Back Precaution Booklet Issued: Yes (comment) Precaution Comments: hand out provided and reviewed verbally with patient Restrictions Weight Bearing Restrictions: No      Mobility  Bed Mobility Overal bed mobility: Needs Assistance Bed Mobility: Rolling;Sidelying to Sit Rolling: Supervision Sidelying to sit: Supervision       General bed mobility comments: VCs for technique, positioning for log roll  Transfers Overall transfer level: Needs assistance Equipment used: Rolling walker (2 wheeled) Transfers: Sit to/from Stand Sit to Stand: Min guard         General transfer comment: increased dizziness upon standing initially  Ambulation/Gait Ambulation/Gait assistance: Min guard Ambulation Distance (Feet): 160 Feet Assistive device: Rolling walker (2 wheeled) Gait Pattern/deviations: Step-through pattern;Decreased stride length;Narrow base of support Gait velocity: decreased Gait velocity  interpretation: Below normal speed for age/gender General Gait Details: Patient very slow and guarded with gait, improved with cues but still remains below normal pacing. VCs for posture and positioning  Stairs            Wheelchair Mobility    Modified Rankin (Stroke Patients Only)       Balance Overall balance assessment: Needs assistance Sitting-balance support: Feet supported Sitting balance-Leahy Scale: Good Sitting balance - Comments: dizziness upon sitting, educated on taking increased time to allow dizziness to subside   Standing balance support: During functional activity;Bilateral upper extremity supported Standing balance-Leahy Scale: Fair Standing balance comment: heavy reliance on RW secondary to pain                             Pertinent Vitals/Pain      Home Living Family/patient expects to be discharged to:: Private residence Living Arrangements: Spouse/significant other Available Help at Discharge: Family Type of Home: House (townhouse) Home Access: Level entry     Home Layout: Two level;Bed/bath upstairs;Able to live on main level with bedroom/bathroom Home Equipment: Walker - standard;Bedside commode Additional Comments: walkin shower with seat built in, standard height    Prior Function Level of Independence: Independent               Hand Dominance   Dominant Hand: Right    Extremity/Trunk Assessment   Upper Extremity Assessment: Overall WFL for tasks assessed           Lower Extremity Assessment: Generalized weakness (reports calf pressure and tightness bilaterally)         Communication   Communication:  (glasses all the time)  Cognition Arousal/Alertness: Awake/alert Behavior During Therapy: WFL for tasks assessed/performed Overall Cognitive Status: Within Functional Limits for tasks assessed  General Comments      Exercises        Assessment/Plan    PT Assessment  Patient needs continued PT services  PT Diagnosis Difficulty walking;Acute pain   PT Problem List Decreased strength;Decreased range of motion;Decreased activity tolerance;Decreased balance;Decreased mobility;Pain  PT Treatment Interventions DME instruction;Gait training;Stair training;Functional mobility training;Therapeutic activities;Therapeutic exercise;Balance training;Patient/family education   PT Goals (Current goals can be found in the Care Plan section) Acute Rehab PT Goals Patient Stated Goal: to go home PT Goal Formulation: With patient Time For Goal Achievement: 12/30/14 Potential to Achieve Goals: Good    Frequency Min 5X/week   Barriers to discharge        Co-evaluation               End of Session Equipment Utilized During Treatment: Gait belt Activity Tolerance: Patient tolerated treatment well Patient left: in chair;with call bell/phone within reach Nurse Communication: Mobility status         Time: 1655-3748 PT Time Calculation (min) (ACUTE ONLY): 25 min   Charges:   PT Evaluation $Initial PT Evaluation Tier I: 1 Procedure PT Treatments $Self Care/Home Management: 8-22   PT G CodesDuncan Dull 12-19-14, 10:26 AM Alben Deeds, PT DPT  [phone]

## 2014-12-16 NOTE — Progress Notes (Signed)
Utilization review completed.  

## 2014-12-16 NOTE — Progress Notes (Signed)
    Patient doing well Patient denies leg pain Very minimal back discomfort   Physical Exam: Filed Vitals:   12/16/14 0517  BP: 146/96  Pulse: 88  Temp: 98.8 F (37.1 C)  Resp: 18    Dressing in place NVI  POD #1 s/p L3/4 fusion, doing very well  - up with PT/OT, encourage ambulation - Percocet for pain, Valium for muscle spasms - likely d/c home today vs. Friday

## 2014-12-16 NOTE — Evaluation (Signed)
Occupational Therapy Evaluation Patient Details Name: [name] [name] MRN: [id number] DOB: 03/14/1946 Today's Date: 12/16/2014    History of Present Illness Patient is a 69 yo male s/p Left sided lateral lumbar interbody fusion, lumbar 3-4, posterior spinal fusion, lumbar 3-4 with instrumentatio. PMH includes: HTN, hyperlipidemia, GERD. Former smoker. BMI 29. S/p lumbar laminectomy/decompression microdiskectomy on 07/08/13.    Clinical Impression   Patient is s/p PLIF L3-4 surgery resulting in functional limitations due to the deficits listed below (see OT problem list). PTA pt was independent with all adls. Patient will benefit from skilled OT acutely to increase independence and safety with ADLS to allow discharge home without needs. Pt could benefit from AE and educated this session on purchase and use/     Follow Up Recommendations  No OT follow up    Equipment Recommendations  None recommended by OT    Recommendations for Other Services       Precautions / Restrictions Precautions Precautions: Back Precaution Booklet Issued: Yes (comment) Precaution Comments: hand out provided and reviewed verbally with patient Restrictions Weight Bearing Restrictions: No      Mobility Bed Mobility Overal bed mobility: Needs Assistance Bed Mobility: Sit to Supine Rolling: Supervision     Sit to supine: Supervision   General bed mobility comments: pt edcuated on bed mobility and return demo. Pt educated on pillow placement and pt return demo  Transfers Overall transfer level: Needs assistance Equipment used: Rolling walker (2 wheeled) Transfers: Sit to/from Stand Sit to Stand: Supervision              Balance Overall balance assessment: Needs assistance   Sitting balance-Leahy Scale: Good       Standing balance-Leahy Scale: Good                              ADL Overall ADL's : Needs assistance/impaired Eating/Feeding: Independent   Grooming:  Wash/dry hands;Supervision/safety   Upper Body Bathing: Supervision/ safety;Sitting   Lower Body Bathing: Supervison/ safety;Cueing for back precautions;Sit to/from stand       Lower Body Dressing: Supervision/safety;Sit to/from stand;Cueing for back precautions;With adaptive equipment Lower Body Dressing Details (indicate cue type and reason): pt demonstrates don doff socks and pants with AE. Pt educated on where to purchase items.  Toilet Transfer: Supervision/safety;Ambulation           Functional mobility during ADLs: Supervision/safety General ADL Comments: Pt with good verbalization of back precautions but needed cues this session for return demo. Pt with handout present and reviewed in detail for ADLS. Pt ambulated ~100 ft at end of session per pt request. Pt educated to push RW and not pick up the RW.      Vision Vision Assessment?: No apparent visual deficits   Perception     Praxis      Pertinent Vitals/Pain       Hand Dominance Right   Extremity/Trunk Assessment Upper Extremity Assessment Upper Extremity Assessment: Overall WFL for tasks assessed   Lower Extremity Assessment Lower Extremity Assessment: Defer to PT evaluation   Cervical / Trunk Assessment Cervical / Trunk Assessment: Other exceptions (s/p surg)   Communication Communication Communication:  (glasses all the time)   Cognition Arousal/Alertness: Awake/alert Behavior During Therapy: WFL for tasks assessed/performed Overall Cognitive Status: Within Functional Limits for tasks assessed                     General Comments  Exercises       Shoulder Instructions      Home Living Family/patient expects to be discharged to:: Private residence Living Arrangements: Spouse/significant other Available Help at Discharge: Family Type of Home: House (townhouse) Home Access: Level entry     Home Layout: Two level;Bed/bath upstairs;Able to live on main level with  bedroom/bathroom     Bathroom Shower/Tub: Occupational psychologist: Standard     Home Equipment: Walker - standard;Bedside commode;Shower seat - built in;Grab bars - tub/shower   Additional Comments: walkin shower with seat built in, standard height      Prior Functioning/Environment Level of Independence: Independent             OT Diagnosis: Generalized weakness;Acute pain   OT Problem List: Decreased strength;Decreased activity tolerance;Impaired balance (sitting and/or standing);Decreased safety awareness;Decreased knowledge of use of DME or AE;Decreased knowledge of precautions;Pain   OT Treatment/Interventions: Self-care/ADL training;Neuromuscular education;DME and/or AE instruction;Therapeutic activities;Patient/family education;Balance training    OT Goals(Current goals can be found in the care plan section) Acute Rehab OT Goals Patient Stated Goal: to go home OT Goal Formulation: With patient Time For Goal Achievement: 12/30/14 Potential to Achieve Goals: Good  OT Frequency: Min 2X/week   Barriers to D/C:            Co-evaluation              End of Session Equipment Utilized During Treatment: Rolling walker;Gait belt Nurse Communication: Mobility status;Precautions  Activity Tolerance: Patient tolerated treatment well Patient left: in bed;with call bell/phone within reach;with family/visitor present   Time: 1030-1105 OT Time Calculation (min): 35 min Charges:  OT General Charges $OT Visit: 1 Procedure OT Evaluation $Initial OT Evaluation Tier I: 1 Procedure OT Treatments $Self Care/Home Management : 8-22 mins G-Codes:    Peri Maris 28-Dec-2014, 1:29 PM  Pager: [phone]

## 2014-12-17 NOTE — Progress Notes (Signed)
Physical Therapy Treatment Patient Details Name: [name] [name] MRN: [id number] DOB: October 17, 1945 Today's Date: 12/17/2014    History of Present Illness Patient is a 69 yo male s/p Left sided lateral lumbar interbody fusion, lumbar 3-4, posterior spinal fusion, lumbar 3-4 with instrumentatio. PMH includes: HTN, hyperlipidemia, GERD. Former smoker. BMI 29. S/p lumbar laminectomy/decompression microdiskectomy on 07/08/13.     PT Comments    Patient mobilizing well, performed stair negotiation without difficulty. Verbally instructed on car transfers. Spoke with patient and spouse at length to address all questions regarding mobility and precautions.  Patient verbalizes understanding.  Follow Up Recommendations  No PT follow up;Supervision/Assistance - 24 hour (initially)     Equipment Recommendations  Rolling walker with 5" wheels    Recommendations for Other Services       Precautions / Restrictions Precautions Precautions: Back Precaution Comments: able to recall 3/3  Restrictions Weight Bearing Restrictions: No    Mobility  Bed Mobility Overal bed mobility: Modified Independent                Transfers Overall transfer level: Needs assistance Equipment used: Rolling walker (2 wheeled) Transfers: Sit to/from Stand Sit to Stand: Supervision            Ambulation/Gait Ambulation/Gait assistance: Supervision Ambulation Distance (Feet): 240 Feet Assistive device: Rolling walker (2 wheeled) Gait Pattern/deviations: Step-through pattern;Decreased stride length;Narrow base of support     General Gait Details: steady with ambulation   Stairs Stairs: Yes Stairs assistance: Supervision Stair Management: One rail Right;Step to pattern;Forwards Number of Stairs: 12 General stair comments: educated on sequencing and safety  Wheelchair Mobility    Modified Rankin (Stroke Patients Only)       Balance     Sitting balance-Leahy Scale: Good       Standing  balance-Leahy Scale: Good                      Cognition Arousal/Alertness: Awake/alert Behavior During Therapy: WFL for tasks assessed/performed Overall Cognitive Status: Within Functional Limits for tasks assessed                      Exercises      General Comments        Pertinent Vitals/Pain Pain Assessment: 0-10 Pain Score: 5  Pain Location: surgical incision site Pain Descriptors / Indicators: Discomfort Pain Intervention(s): Monitored during session;Premedicated before session    Home Living                      Prior Function            PT Goals (current goals can now be found in the care plan section) Acute Rehab PT Goals Patient Stated Goal: to go home PT Goal Formulation: With patient Time For Goal Achievement: 12/30/14 Potential to Achieve Goals: Good Progress towards PT goals: Progressing toward goals    Frequency  Min 5X/week    PT Plan Current plan remains appropriate    Co-evaluation             End of Session Equipment Utilized During Treatment: Gait belt Activity Tolerance: Patient tolerated treatment well Patient left: in chair;with call bell/phone within reach     Time: 0942-1008 PT Time Calculation (min) (ACUTE ONLY): 26 min  Charges:  $Gait Training: 8-22 mins $Self Care/Home Management: 8-22                    G Codes:  Duncan Dull 12/17/2014, 10:18 AM Alben Deeds, PT DPT  [phone]

## 2014-12-17 NOTE — Progress Notes (Signed)
Patient discharged home with wife. RN discussed discharge instructions and medications with patient. RN reviewed constipation prevention, s/sx of infection. Patient and wife vocalized understanding of discharge instructions and medications. Prescriptions and discharge instructions given to patient. Rolling walker delivered to patient by equipment. Patient's neuro assessment intact, unchanged. Patient denies n/w/t. Patient's dressing is clean, dry, and intact.

## 2014-12-17 NOTE — Progress Notes (Signed)
    Patient doing well, excellent progress in PT/OT yesterday, good appetite and NL B/B function, moderate well controlled LBP mostly stiffness, no leg pain   Physical Exam: BP 145/89 mmHg  Pulse 59  Temp(Src) 98.7 F (37.1 C) (Oral)  Resp 20  Ht 5\' 11"  (1.803 m)  Wt 97.8 kg (215 lb 9.8 oz)  BMI 30.08 kg/m2  SpO2 97%  Dressing in place, pt comfortable in bed eating breakfast, SCD's in place NVI  POD #2 s/p L3-4 fusion doing excellent  - up with PT/OT, encourage ambulation - Percocet for pain, Valium for muscle spasms - likely d/c home today after PT

## 2014-12-17 NOTE — Discharge Instructions (Signed)
Constipation °Constipation is when a Vu has fewer than three bowel movements a week, has difficulty having a bowel movement, or has stools that are dry, hard, or larger than normal. As people grow older, constipation is more common. If you try to fix constipation with medicines that make you have a bowel movement (laxatives), the problem may get worse. Long-term laxative use may cause the muscles of the colon to become weak. A low-fiber diet, not taking in enough fluids, and taking certain medicines may make constipation worse.  °CAUSES  °· Certain medicines, such as antidepressants, pain medicine, iron supplements, antacids, and water pills.   °· Certain diseases, such as diabetes, irritable bowel syndrome (IBS), thyroid disease, or depression.   °· Not drinking enough water.   °· Not eating enough fiber-rich foods.   °· Stress or travel.   °· Lack of physical activity or exercise.   °· Ignoring the urge to have a bowel movement.   °· Using laxatives too much.   °SIGNS AND SYMPTOMS  °· Having fewer than three bowel movements a week.   °· Straining to have a bowel movement.   °· Having stools that are hard, dry, or larger than normal.   °· Feeling full or bloated.   °· Pain in the lower abdomen.   °· Not feeling relief after having a bowel movement.   °DIAGNOSIS  °Your health care provider will take a medical history and perform a physical exam. Further testing may be done for severe constipation. Some tests may include: °· A barium enema X-ray to examine your rectum, colon, and, sometimes, your small intestine.   °· A sigmoidoscopy to examine your lower colon.   °· A colonoscopy to examine your entire colon. °TREATMENT  °Treatment will depend on the severity of your constipation and what is causing it. Some dietary treatments include drinking more fluids and eating more fiber-rich foods. Lifestyle treatments may include regular exercise. If these diet and lifestyle recommendations do not help, your health care  provider may recommend taking over-the-counter laxative medicines to help you have bowel movements. Prescription medicines may be prescribed if over-the-counter medicines do not work.  °HOME CARE INSTRUCTIONS  °· Eat foods that have a lot of fiber, such as fruits, vegetables, whole grains, and beans. °· Limit foods high in fat and processed sugars, such as french fries, hamburgers, cookies, candies, and soda.   °· A fiber supplement may be added to your diet if you cannot get enough fiber from foods.   °· Drink enough fluids to keep your urine clear or pale yellow.   °· Exercise regularly or as directed by your health care provider.   °· Go to the restroom when you have the urge to go. Do not hold it.   °· Only take over-the-counter or prescription medicines as directed by your health care provider. Do not take other medicines for constipation without talking to your health care provider first.   °SEEK IMMEDIATE MEDICAL CARE IF:  °· You have bright red blood in your stool.   °· Your constipation lasts for more than 4 days or gets worse.   °· You have abdominal or rectal pain.   °· You have thin, pencil-like stools.   °· You have unexplained weight loss. °MAKE SURE YOU:  °· Understand these instructions. °· Will watch your condition. °· Will get help right away if you are not doing well or get worse. °Document Released: 05/04/2004 Document Revised: 08/11/2013 Document Reviewed: 05/18/2013 °[employer]® Patient Information ©2015 [employer]. This information is not intended to replace advice given to you by your health care provider. Make sure you discuss any questions   you have with your health care provider.  Acetaminophen; Oxycodone tablets What is this medicine? ACETAMINOPHEN; OXYCODONE (a set a MEE noe fen; ox i KOE done) is a pain reliever. It is used to treat mild to moderate pain. This medicine may be used for other purposes; ask your health care provider or pharmacist if you have questions. COMMON BRAND  NAME(S): Endocet, Magnacet, Narvox, Percocet, Perloxx, Primalev, Primlev, Roxicet, Xolox What should I tell my health care provider before I take this medicine? They need to know if you have any of these conditions: -brain tumor -Crohn's disease, inflammatory bowel disease, or ulcerative colitis -drug abuse or addiction -head injury -heart or circulation problems -if you often drink alcohol -kidney disease or problems going to the bathroom -liver disease -lung disease, asthma, or breathing problems -an unusual or allergic reaction to acetaminophen, oxycodone, other opioid analgesics, other medicines, foods, dyes, or preservatives -pregnant or trying to get pregnant -breast-feeding How should I use this medicine? Take this medicine by mouth with a full glass of water. Follow the directions on the prescription label. Take your medicine at regular intervals. Do not take your medicine more often than directed. Talk to your pediatrician regarding the use of this medicine in children. Special care may be needed. Patients over 45 years old may have a stronger reaction and need a smaller dose. Overdosage: If you think you have taken too much of this medicine contact a poison control center or emergency room at once. NOTE: This medicine is only for you. Do not share this medicine with others. What if I miss a dose? If you miss a dose, take it as soon as you can. If it is almost time for your next dose, take only that dose. Do not take double or extra doses. What may interact with this medicine? -alcohol -antihistamines -barbiturates like amobarbital, butalbital, butabarbital, methohexital, pentobarbital, phenobarbital, thiopental, and secobarbital -benztropine -drugs for bladder problems like solifenacin, trospium, oxybutynin, tolterodine, hyoscyamine, and methscopolamine -drugs for breathing problems like ipratropium and tiotropium -drugs for certain stomach or intestine problems like  propantheline, homatropine methylbromide, glycopyrrolate, atropine, belladonna, and dicyclomine -general anesthetics like etomidate, ketamine, nitrous oxide, propofol, desflurane, enflurane, halothane, isoflurane, and sevoflurane -medicines for depression, anxiety, or psychotic disturbances -medicines for sleep -muscle relaxants -naltrexone -narcotic medicines (opiates) for pain -phenothiazines like perphenazine, thioridazine, chlorpromazine, mesoridazine, fluphenazine, prochlorperazine, promazine, and trifluoperazine -scopolamine -tramadol -trihexyphenidyl This list may not describe all possible interactions. Give your health care provider a list of all the medicines, herbs, non-prescription drugs, or dietary supplements you use. Also tell them if you smoke, drink alcohol, or use illegal drugs. Some items may interact with your medicine. What should I watch for while using this medicine? Tell your doctor or health care professional if your pain does not go away, if it gets worse, or if you have new or a different type of pain. You may develop tolerance to the medicine. Tolerance means that you will need a higher dose of the medication for pain relief. Tolerance is normal and is expected if you take this medicine for a long time. Do not suddenly stop taking your medicine because you may develop a severe reaction. Your body becomes used to the medicine. This does NOT mean you are addicted. Addiction is a behavior related to getting and using a drug for a non-medical reason. If you have pain, you have a medical reason to take pain medicine. Your doctor will tell you how much medicine to take. If your doctor wants  you to stop the medicine, the dose will be slowly lowered over time to avoid any side effects. You may get drowsy or dizzy. Do not drive, use machinery, or do anything that needs mental alertness until you know how this medicine affects you. Do not stand or sit up quickly, especially if you are  an older patient. This reduces the risk of dizzy or fainting spells. Alcohol may interfere with the effect of this medicine. Avoid alcoholic drinks. There are different types of narcotic medicines (opiates) for pain. If you take more than one type at the same time, you may have more side effects. Give your health care provider a list of all medicines you use. Your doctor will tell you how much medicine to take. Do not take more medicine than directed. Call emergency for help if you have problems breathing. The medicine will cause constipation. Try to have a bowel movement at least every 2 to 3 days. If you do not have a bowel movement for 3 days, call your doctor or health care professional. Do not take Tylenol (acetaminophen) or medicines that have acetaminophen with this medicine. Too much acetaminophen can be very dangerous. Many nonprescription medicines contain acetaminophen. Always read the labels carefully to avoid taking more acetaminophen. What side effects may I notice from receiving this medicine? Side effects that you should report to your doctor or health care professional as soon as possible: -allergic reactions like skin rash, itching or hives, swelling of the face, lips, or tongue -breathing difficulties, wheezing -confusion -light headedness or fainting spells -severe stomach pain -unusually weak or tired -yellowing of the skin or the whites of the eyes Side effects that usually do not require medical attention (report to your doctor or health care professional if they continue or are bothersome): -dizziness -drowsiness -nausea -vomiting This list may not describe all possible side effects. Call your doctor for medical advice about side effects. You may report side effects to FDA at 1-800-FDA-1088. Where should I keep my medicine? Keep out of the reach of children. This medicine can be abused. Keep your medicine in a safe place to protect it from theft. Do not share this medicine  with anyone. Selling or giving away this medicine is dangerous and against the law. Store at room temperature between 20 and 25 degrees C (68 and 77 degrees F). Keep container tightly closed. Protect from light. This medicine may cause accidental overdose and death if it is taken by other adults, children, or pets. Flush any unused medicine down the toilet to reduce the chance of harm. Do not use the medicine after the expiration date. NOTE: This sheet is a summary. It may not cover all possible information. If you have questions about this medicine, talk to your doctor, pharmacist, or health care provider.  2015, [employer]. (2013-03-30 13:17:35)  Diazepam tablets What is this medicine? DIAZEPAM (dye AZ e pam) is a benzodiazepine. It is used to treat anxiety and nervousness. It also can help treat alcohol withdrawal, relax muscles, and treat certain types of seizures. This medicine may be used for other purposes; ask your health care provider or pharmacist if you have questions. COMMON BRAND NAME(S): Valium What should I tell my health care provider before I take this medicine? They need to know if you have any of these conditions -an alcohol or drug abuse problem -bipolar disorder, depression, psychosis or other mental health condition -glaucoma -kidney or liver disease -lung or breathing disease -myasthenia gravis -Parkinson's disease -seizures or  a history of seizures -suicidal thoughts -an unusual or allergic reaction to diazepam, other benzodiazepines, foods, dyes, or preservatives -pregnant or trying to get pregnant -breast-feeding How should I use this medicine? Take this medicine by mouth with a glass of water. Follow the directions on the prescription label. If this medicine upsets your stomach, take it with food or milk. Take your doses at regular intervals. Do not take your medicine more often than directed. If you have been taking this medicine regularly for some  time, do not suddenly stop taking it. You must gradually reduce the dose or you may get severe side effects. Ask your doctor or health care professional for advice. Even after you stop taking this medicine it can still affect your body for several days. Talk to your pediatrician regarding the use of this medicine in children. Special care may be needed. Overdosage: If you think you have taken too much of this medicine contact a poison control center or emergency room at once. NOTE: This medicine is only for you. Do not share this medicine with others. What if I miss a dose? If you miss a dose, take it as soon as you can. If it is almost time for your next dose, take only that dose. Do not take double or extra doses. What may interact with this medicine? -cimetidine -grapefruit juice -herbal or dietary supplements like kava kava, melatonin, St. John's Wort, or valerian -medicines for anxiety or sleeping problems, like alprazolam, lorazepam, or triazolam -medicines for depression, mental problems or psychiatric disturbances -medicines for HIV infection or AIDS -prescription pain medicines -rifampin, rifapentine, or rifabutin -some medicines for seizures like carbamazepine, phenobarbital, phenytoin, or primidone This list may not describe all possible interactions. Give your health care provider a list of all the medicines, herbs, non-prescription drugs, or dietary supplements you use. Also tell them if you smoke, drink alcohol, or use illegal drugs. Some items may interact with your medicine. What should I watch for while using this medicine? Visit your doctor or health care professional for regular checks on your progress. Your body can become dependent on this medicine. Ask your doctor or health care professional if you still need to take it. You may get drowsy or dizzy. Do not drive, use machinery, or do anything that needs mental alertness until you know how this medicine affects you. To reduce  the risk of dizzy and fainting spells, do not stand or sit up quickly, especially if you are an older patient. Alcohol may increase dizziness and drowsiness. Avoid alcoholic drinks. Do not treat yourself for coughs, colds or allergies without asking your doctor or health care professional for advice. Some ingredients can increase possible side effects. What side effects may I notice from receiving this medicine? Side effects that you should report to your doctor or health care professional as soon as possible: -allergic reactions like skin rash, itching or hives, swelling of the face, lips, or tongue -angry, confused, depressed, other mood changes -breathing problems -feeling faint or lightheaded, falls -muscle cramps -problems with balance, talking, walking -restlessness -tremors -trouble passing urine or change in the amount of urine -unusually weak or tired Side effects that usually do not require medical attention (report to your doctor or health care professional if they continue or are bothersome): -difficulty sleeping, nightmares -dizziness, drowsiness, clumsiness, or unsteadiness, a hangover effect -headache -nausea, vomiting This list may not describe all possible side effects. Call your doctor for medical advice about side effects. You may report side effects  to FDA at 1-800-FDA-1088. Where should I keep my medicine? Keep out of the reach of children. This medicine can be abused. Keep your medicine in a safe place to protect it from theft. Do not share this medicine with anyone. Selling or giving away this medicine is dangerous and against the law. Store at room temperature between 15 and 30 degrees C (59 and 86 degrees F). Protect from light. Keep container tightly closed. Throw away any unused medicine after the expiration date. NOTE: This sheet is a summary. It may not cover all possible information. If you have questions about this medicine, talk to your doctor, pharmacist, or  health care provider.  2015, [employer] Standard. (2007-11-24 16:57:35)

## 2014-12-17 NOTE — Progress Notes (Signed)
Occupational Therapy Treatment Patient Details Name: [name] [name] MRN: [id number] DOB: 1945/10/14 Today's Date: 12/17/2014    History of present illness Patient is a 69 yo male s/p Left sided lateral lumbar interbody fusion, lumbar 3-4, posterior spinal fusion, lumbar 3-4 with instrumentatio. PMH includes: HTN, hyperlipidemia, GERD. Former smoker. BMI 29. S/p lumbar laminectomy/decompression microdiskectomy on 07/08/13.    OT comments  PT is at adequate level from OT standpoint to d/c home with wife (A). Pt pleasant and making steady progress.   Follow Up Recommendations  No OT follow up    Equipment Recommendations  None recommended by OT    Recommendations for Other Services      Precautions / Restrictions Precautions Precautions: Back Precaution Comments: Pt recalling 2 out 3 precautions and reeducated on arching        Mobility Bed Mobility Overal bed mobility: Modified Independent                Transfers Overall transfer level: Needs assistance Equipment used: Rolling walker (2 wheeled) Transfers: Sit to/from Stand Sit to Stand: Supervision              Balance                                   ADL Overall ADL's : Needs assistance/impaired                         Toilet Transfer: Supervision/safety;Ambulation   Toileting- Clothing Manipulation and Hygiene: Supervision/safety   Tub/ Banker: Supervision/safety;Rolling walker     General ADL Comments: pt and wife educated on pain management , shower and sequence, no driving for 2 weeks and to discuss return to work with MD but it will not be within 2 weeks      Vision                     Perception     Praxis      Cognition   Behavior During Therapy: WFL for tasks assessed/performed Overall Cognitive Status: Within Functional Limits for tasks assessed                       Extremity/Trunk Assessment               Exercises      Shoulder Instructions       General Comments      Pertinent Vitals/ Pain       Pain Assessment: 0-10 Pain Score: 5  Pain Location: surgicial Pain Intervention(s): Monitored during session;Premedicated before session  Home Living                                          Prior Functioning/Environment              Frequency Min 2X/week     Progress Toward Goals  OT Goals(current goals can now be found in the care plan section)  Progress towards OT goals: Progressing toward goals  Acute Rehab OT Goals Patient Stated Goal: to go home OT Goal Formulation: With patient Time For Goal Achievement: 12/30/14 Potential to Achieve Goals: Good ADL Goals Pt Will Transfer to Toilet: with modified independence;ambulating;regular height toilet Pt Will Perform Toileting - Clothing Manipulation and hygiene: with  modified independence;sit to/from stand Pt Will Perform Tub/Shower Transfer: Shower transfer;rolling walker;ambulating;with supervision  Plan Discharge plan remains appropriate    Co-evaluation                 End of Session Equipment Utilized During Treatment: Rolling walker   Activity Tolerance Patient tolerated treatment well   Patient Left in chair;with call bell/phone within reach;with family/visitor present   Nurse Communication Mobility status;Precautions        Time: 0263-7858 OT Time Calculation (min): 23 min  Charges: OT General Charges $OT Visit: 1 Procedure OT Treatments $Self Care/Home Management : 23-37 mins  Parke Poisson B 12/17/2014, 10:01 AM  Pager: [phone]

## 2014-12-22 ENCOUNTER — Encounter (HOSPITAL_COMMUNITY): Payer: Self-pay | Admitting: Orthopedic Surgery

## 2014-12-29 NOTE — Discharge Summary (Signed)
Patient ID: [name] [name] Demetrius MRN: [id number] DOB/AGE: Jan 22, 1946 69 y.o.  Admit date: 12/15/2014 Discharge date: 12/17/2014  Admission Diagnoses:  Active Problems:   Radiculopathy   Discharge Diagnoses:  Same  Past Medical History  Diagnosis Date  . Hypertension     takes Amlodipine daily and Lotensin as well  . Hyperlipidemia     takes Crestor daily  . Chronic back pain     stenosis  . GERD (gastroesophageal reflux disease)     occasionally will take a zantac(maybe once a month)  . History of colon polyps   . Enlarged prostate   . History of kidney stones   . History of stress test     done 10 yrs. ago, as a baseline     Surgeries: Procedure(s): ANTERIOR LATERAL LUMBAR FUSION 1 LEVEL L3-4 POSTERIOR LUMBAR FUSION 1 LEVEL L3-4 on 12/15/2014   Consultants:  None  Discharged Condition: Improved  Hospital Course: Izsak Meir Junod is an 69 y.o. male who was admitted 12/15/2014 for operative treatment of radiculopathy. Patient has severe unremitting pain that affects sleep, daily activities, and work/hobbies. After pre-op clearance the patient was taken to the operating room on 12/15/2014 and underwent  Procedure(s): ANTERIOR LATERAL LUMBAR FUSION 1 LEVEL L3-4 POSTERIOR LUMBAR FUSION 1 LEVEL L3-4.    Patient was given perioperative antibiotics:  Anti-infectives    Start     Dose/Rate Route Frequency Ordered Stop   12/15/14 2245  ceFAZolin (ANCEF) IVPB 1 g/50 mL premix     1 g 100 mL/hr over 30 Minutes Intravenous Every 8 hours 12/15/14 2238 12/16/14 0548   12/15/14 0600  ceFAZolin (ANCEF) IVPB 2 g/50 mL premix     2 g 100 mL/hr over 30 Minutes Intravenous On call to O.R. 12/14/14 1423 12/15/14 1630       Patient was given sequential compression devices, early ambulation to prevent DVT.  Patient benefited maximally from hospital stay and there were no complications.    Recent vital signs: BP 147/84 mmHg  Pulse 70  Temp(Src) 98.4 F (36.9 C) (Oral)  Resp 20  Ht 5'  11" (1.803 m)  Wt 97.8 kg (215 lb 9.8 oz)  BMI 30.08 kg/m2  SpO2 100%  Discharge Medications:     Medication List    STOP taking these medications        oxyCODONE-acetaminophen 5-325 MG per tablet  Commonly known as:  PERCOCET/ROXICET      TAKE these medications        acetaminophen 500 MG tablet  Commonly known as:  TYLENOL  Take 1,000 mg by mouth every 6 (six) hours as needed.     amLODipine 5 MG tablet  Commonly known as:  NORVASC  Take 5 mg by mouth daily.     aspirin EC 81 MG tablet  Take 81 mg by mouth daily.     benazepril-hydrochlorthiazide 20-25 MG per tablet  Commonly known as:  LOTENSIN HCT  Take 1 tablet by mouth daily.     CALCIUM PO  Take 1 tablet by mouth daily.     CHERATUSSIN AC 100-10 MG/5ML syrup  Generic drug:  guaiFENesin-codeine  Take 5 mLs by mouth every 4 (four) hours as needed for cough.     mometasone 50 MCG/ACT nasal spray  Commonly known as:  NASONEX  Place 2 sprays into the nose daily as needed (congestion).     MULTIVITAL PO  Take 1 tablet by mouth daily.     ranitidine 150 MG tablet  Commonly known as:  ZANTAC  Take 150 mg by mouth daily as needed for heartburn.     rosuvastatin 10 MG tablet  Commonly known as:  CRESTOR  Take 10 mg by mouth daily.     tadalafil 20 MG tablet  Commonly known as:  CIALIS  Take 20 mg by mouth daily as needed for erectile dysfunction.     VITAMIN B-12 PO  Take 1 tablet by mouth daily.        Diagnostic Studies: Dg Chest 2 View  12/10/2014   CLINICAL DATA:  Preoperative exam prior to lumbar surgery scheduled for December 15, 2014; history of hypertension ; discontinued smoking in October 2014; recent flu-like illness with persistent cough for the past 2 weeks  EXAM: CHEST  2 VIEW  COMPARISON:  PA and lateral chest of June 30, 2013  FINDINGS: The right lung is adequately inflated and clear. On the left there is lingular subsegmental atelectasis or scarring new since the previous study. There  is no pleural effusion. The cardiac silhouette is top-normal in size. There is tortuosity of the descending thoracic aorta. The bony thorax exhibits no acute abnormality.  IMPRESSION: Lingular atelectasis or scarring. There is no alveolar pneumonia nor evidence of CHF.   Electronically Signed   By: David  Martinique M.D.   On: 12/10/2014 13:20   Dg Lumbar Spine 2-3 Views  12/15/2014   CLINICAL DATA:  69 year old male with a history of left-sided anterior lateral lumbar fusion, L3-L4  EXAM: DG C-ARM GT 120 MIN; LUMBAR SPINE - 2-3 VIEW  COMPARISON:  Prior MR lumbar spine 05/18/2014  FINDINGS: Intraoperative fluoroscopic images of the lumbar spine.  Surgical changes of lumbar interbody fusion with left-sided pedicle screw and rod fixation of L3-L4 with interbody spacer. No complicating features identified.  IMPRESSION: Surgical changes of lumbar fusion, L3-L4 with spacer in the L3-L4 region and left-sided pedicle screw and rod fixation, and no complicating feature.  Please refer to the dictated operative report for full details of intraoperative findings and procedure.  Signed,  Dulcy Fanny. Earleen Newport, DO  [facility]  [facility]   Electronically Signed   By: Corrie Mckusick D.O.   On: 12/15/2014 20:21   Dg C-arm Gt 120 Min  12/15/2014   CLINICAL DATA:  69 year old male with a history of left-sided anterior lateral lumbar fusion, L3-L4  EXAM: DG C-ARM GT 120 MIN; LUMBAR SPINE - 2-3 VIEW  COMPARISON:  Prior MR lumbar spine 05/18/2014  FINDINGS: Intraoperative fluoroscopic images of the lumbar spine.  Surgical changes of lumbar interbody fusion with left-sided pedicle screw and rod fixation of L3-L4 with interbody spacer. No complicating features identified.  IMPRESSION: Surgical changes of lumbar fusion, L3-L4 with spacer in the L3-L4 region and left-sided pedicle screw and rod fixation, and no complicating feature.  Please refer to the dictated operative report for full details  of intraoperative findings and procedure.  Signed,  Dulcy Fanny. Earleen Newport, DO  [facility]  [facility]   Electronically Signed   By: Corrie Mckusick D.O.   On: 12/15/2014 20:21    Disposition: 01-Home or Self Care   POD #2 s/p L3-4 fusion doing excellent  - up with PT/OT, encourage ambulation - Percocet for pain, Valium for muscle spasms -Written scripts for pain signed and in chart -D/C instructions sheet printed and in chart -D/C today  -F/U in office 2 weeks   Signed: Justice Britain 12/29/2014, 1:20 PM

## 2015-02-14 ENCOUNTER — Other Ambulatory Visit: Payer: Self-pay

## 2019-09-29 ENCOUNTER — Other Ambulatory Visit (HOSPITAL_COMMUNITY): Payer: Self-pay | Admitting: Orthopedic Surgery

## 2019-09-29 DIAGNOSIS — M4807 Spinal stenosis, lumbosacral region: Secondary | ICD-10-CM

## 2019-10-08 ENCOUNTER — Emergency Department (HOSPITAL_COMMUNITY): Payer: Medicare Other

## 2019-10-08 ENCOUNTER — Inpatient Hospital Stay (HOSPITAL_COMMUNITY): Payer: Medicare Other

## 2019-10-08 ENCOUNTER — Encounter (HOSPITAL_COMMUNITY): Payer: Self-pay | Admitting: Emergency Medicine

## 2019-10-08 ENCOUNTER — Other Ambulatory Visit: Payer: Self-pay

## 2019-10-08 ENCOUNTER — Inpatient Hospital Stay (HOSPITAL_COMMUNITY)
Admission: EM | Admit: 2019-10-08 | Discharge: 2019-10-24 | DRG: 219 | Disposition: A | Discharge status: Rehabilitation Facility | Payer: Medicare Other | Attending: Internal Medicine | Admitting: Internal Medicine

## 2019-10-08 DIAGNOSIS — G9600 Cerebrospinal fluid leak, unspecified: Secondary | ICD-10-CM | POA: Diagnosis not present

## 2019-10-08 DIAGNOSIS — N400 Enlarged prostate without lower urinary tract symptoms: Secondary | ICD-10-CM | POA: Diagnosis present

## 2019-10-08 DIAGNOSIS — I161 Hypertensive emergency: Secondary | ICD-10-CM | POA: Diagnosis present

## 2019-10-08 DIAGNOSIS — Z888 Allergy status to other drugs, medicaments and biological substances status: Secondary | ICD-10-CM

## 2019-10-08 DIAGNOSIS — I7103 Dissection of thoracoabdominal aorta: Secondary | ICD-10-CM | POA: Diagnosis not present

## 2019-10-08 DIAGNOSIS — K567 Ileus, unspecified: Secondary | ICD-10-CM

## 2019-10-08 DIAGNOSIS — I351 Nonrheumatic aortic (valve) insufficiency: Secondary | ICD-10-CM | POA: Diagnosis not present

## 2019-10-08 DIAGNOSIS — I639 Cerebral infarction, unspecified: Secondary | ICD-10-CM | POA: Diagnosis not present

## 2019-10-08 DIAGNOSIS — E872 Acidosis: Secondary | ICD-10-CM | POA: Diagnosis not present

## 2019-10-08 DIAGNOSIS — I959 Hypotension, unspecified: Secondary | ICD-10-CM | POA: Diagnosis not present

## 2019-10-08 DIAGNOSIS — I6310 Cerebral infarction due to embolism of unspecified precerebral artery: Secondary | ICD-10-CM

## 2019-10-08 DIAGNOSIS — R29702 NIHSS score 2: Secondary | ICD-10-CM | POA: Diagnosis not present

## 2019-10-08 DIAGNOSIS — N1831 Chronic kidney disease, stage 3a: Secondary | ICD-10-CM | POA: Diagnosis present

## 2019-10-08 DIAGNOSIS — Z0189 Encounter for other specified special examinations: Secondary | ICD-10-CM

## 2019-10-08 DIAGNOSIS — G8929 Other chronic pain: Secondary | ICD-10-CM | POA: Diagnosis present

## 2019-10-08 DIAGNOSIS — I2693 Single subsegmental pulmonary embolism without acute cor pulmonale: Secondary | ICD-10-CM | POA: Diagnosis not present

## 2019-10-08 DIAGNOSIS — K56609 Unspecified intestinal obstruction, unspecified as to partial versus complete obstruction: Secondary | ICD-10-CM

## 2019-10-08 DIAGNOSIS — I2609 Other pulmonary embolism with acute cor pulmonale: Secondary | ICD-10-CM | POA: Diagnosis not present

## 2019-10-08 DIAGNOSIS — I69398 Other sequelae of cerebral infarction: Secondary | ICD-10-CM | POA: Diagnosis not present

## 2019-10-08 DIAGNOSIS — I2699 Other pulmonary embolism without acute cor pulmonale: Secondary | ICD-10-CM

## 2019-10-08 DIAGNOSIS — Y92230 Patient room in hospital as the place of occurrence of the external cause: Secondary | ICD-10-CM | POA: Diagnosis not present

## 2019-10-08 DIAGNOSIS — Z87891 Personal history of nicotine dependence: Secondary | ICD-10-CM

## 2019-10-08 DIAGNOSIS — R140 Abdominal distension (gaseous): Secondary | ICD-10-CM

## 2019-10-08 DIAGNOSIS — I63443 Cerebral infarction due to embolism of bilateral cerebellar arteries: Secondary | ICD-10-CM | POA: Diagnosis not present

## 2019-10-08 DIAGNOSIS — T40605A Adverse effect of unspecified narcotics, initial encounter: Secondary | ICD-10-CM | POA: Diagnosis not present

## 2019-10-08 DIAGNOSIS — E785 Hyperlipidemia, unspecified: Secondary | ICD-10-CM | POA: Diagnosis present

## 2019-10-08 DIAGNOSIS — K219 Gastro-esophageal reflux disease without esophagitis: Secondary | ICD-10-CM | POA: Diagnosis present

## 2019-10-08 DIAGNOSIS — I350 Nonrheumatic aortic (valve) stenosis: Secondary | ICD-10-CM | POA: Diagnosis not present

## 2019-10-08 DIAGNOSIS — M545 Low back pain: Secondary | ICD-10-CM | POA: Diagnosis present

## 2019-10-08 DIAGNOSIS — D62 Acute posthemorrhagic anemia: Secondary | ICD-10-CM | POA: Diagnosis not present

## 2019-10-08 DIAGNOSIS — Z791 Long term (current) use of non-steroidal anti-inflammatories (NSAID): Secondary | ICD-10-CM

## 2019-10-08 DIAGNOSIS — T508X5A Adverse effect of diagnostic agents, initial encounter: Secondary | ICD-10-CM | POA: Diagnosis not present

## 2019-10-08 DIAGNOSIS — Z87442 Personal history of urinary calculi: Secondary | ICD-10-CM

## 2019-10-08 DIAGNOSIS — Z7984 Long term (current) use of oral hypoglycemic drugs: Secondary | ICD-10-CM

## 2019-10-08 DIAGNOSIS — M48061 Spinal stenosis, lumbar region without neurogenic claudication: Secondary | ICD-10-CM | POA: Diagnosis present

## 2019-10-08 DIAGNOSIS — Z20822 Contact with and (suspected) exposure to covid-19: Secondary | ICD-10-CM | POA: Diagnosis present

## 2019-10-08 DIAGNOSIS — I719 Aortic aneurysm of unspecified site, without rupture: Secondary | ICD-10-CM | POA: Diagnosis not present

## 2019-10-08 DIAGNOSIS — E1151 Type 2 diabetes mellitus with diabetic peripheral angiopathy without gangrene: Secondary | ICD-10-CM | POA: Diagnosis present

## 2019-10-08 DIAGNOSIS — E1122 Type 2 diabetes mellitus with diabetic chronic kidney disease: Secondary | ICD-10-CM | POA: Diagnosis present

## 2019-10-08 DIAGNOSIS — Z9889 Other specified postprocedural states: Secondary | ICD-10-CM | POA: Diagnosis not present

## 2019-10-08 DIAGNOSIS — L02214 Cutaneous abscess of groin: Secondary | ICD-10-CM | POA: Diagnosis not present

## 2019-10-08 DIAGNOSIS — Z7982 Long term (current) use of aspirin: Secondary | ICD-10-CM

## 2019-10-08 DIAGNOSIS — Z981 Arthrodesis status: Secondary | ICD-10-CM

## 2019-10-08 DIAGNOSIS — Z882 Allergy status to sulfonamides status: Secondary | ICD-10-CM

## 2019-10-08 DIAGNOSIS — Z8673 Personal history of transient ischemic attack (TIA), and cerebral infarction without residual deficits: Secondary | ICD-10-CM | POA: Diagnosis not present

## 2019-10-08 DIAGNOSIS — Z8679 Personal history of other diseases of the circulatory system: Secondary | ICD-10-CM | POA: Diagnosis not present

## 2019-10-08 DIAGNOSIS — I129 Hypertensive chronic kidney disease with stage 1 through stage 4 chronic kidney disease, or unspecified chronic kidney disease: Secondary | ICD-10-CM | POA: Diagnosis present

## 2019-10-08 DIAGNOSIS — Z79899 Other long term (current) drug therapy: Secondary | ICD-10-CM

## 2019-10-08 DIAGNOSIS — I7102 Dissection of abdominal aorta: Secondary | ICD-10-CM | POA: Diagnosis not present

## 2019-10-08 DIAGNOSIS — R0602 Shortness of breath: Secondary | ICD-10-CM

## 2019-10-08 DIAGNOSIS — N179 Acute kidney failure, unspecified: Secondary | ICD-10-CM

## 2019-10-08 DIAGNOSIS — E1351 Other specified diabetes mellitus with diabetic peripheral angiopathy without gangrene: Secondary | ICD-10-CM | POA: Diagnosis not present

## 2019-10-08 DIAGNOSIS — K921 Melena: Secondary | ICD-10-CM | POA: Diagnosis not present

## 2019-10-08 DIAGNOSIS — I7100 Dissection of unspecified site of aorta: Secondary | ICD-10-CM | POA: Diagnosis not present

## 2019-10-08 DIAGNOSIS — I6529 Occlusion and stenosis of unspecified carotid artery: Secondary | ICD-10-CM | POA: Diagnosis not present

## 2019-10-08 DIAGNOSIS — Z95828 Presence of other vascular implants and grafts: Secondary | ICD-10-CM

## 2019-10-08 LAB — SARS CORONAVIRUS 2 (TAT 6-24 HRS): SARS Coronavirus 2: NEGATIVE

## 2019-10-08 LAB — CBC
HCT: 43 % (ref 39.0–52.0)
Hemoglobin: 14.5 g/dL (ref 13.0–17.0)
MCH: 29.7 pg (ref 26.0–34.0)
MCHC: 33.7 g/dL (ref 30.0–36.0)
MCV: 88.1 fL (ref 80.0–100.0)
Platelets: 175 10*3/uL (ref 150–400)
RBC: 4.88 MIL/uL (ref 4.22–5.81)
RDW: 13.7 % (ref 11.5–15.5)
WBC: 14.1 10*3/uL — ABNORMAL HIGH (ref 4.0–10.5)
nRBC: 0 % (ref 0.0–0.2)

## 2019-10-08 LAB — BASIC METABOLIC PANEL
Anion gap: 14 (ref 5–15)
BUN: 12 mg/dL (ref 8–23)
CO2: 23 mmol/L (ref 22–32)
Calcium: 9.7 mg/dL (ref 8.9–10.3)
Chloride: 102 mmol/L (ref 98–111)
Creatinine, Ser: 1.25 mg/dL — ABNORMAL HIGH (ref 0.61–1.24)
GFR calc Af Amer: >60 mL/min (ref >60)
GFR calc non Af Amer: 56 mL/min — ABNORMAL LOW (ref >60)
Glucose, Bld: 170 mg/dL — ABNORMAL HIGH (ref 70–99)
Potassium: 3.3 mmol/L — ABNORMAL LOW (ref 3.5–5.1)
Sodium: 139 mmol/L (ref 135–145)

## 2019-10-08 LAB — RESPIRATORY PANEL BY RT PCR (FLU A&B, COVID)
Influenza A by PCR: NEGATIVE
Influenza B by PCR: NEGATIVE
SARS Coronavirus 2 by RT PCR: NEGATIVE

## 2019-10-08 LAB — PROTIME-INR
INR: 1.1 (ref 0.8–1.2)
Prothrombin Time: 14.4 seconds (ref 11.4–15.2)

## 2019-10-08 LAB — TROPONIN I (HIGH SENSITIVITY)
Troponin I (High Sensitivity): 28 ng/L — ABNORMAL HIGH (ref <18)
Troponin I (High Sensitivity): 34 ng/L — ABNORMAL HIGH (ref <18)

## 2019-10-08 LAB — CBG MONITORING, ED: Glucose-Capillary: 165 mg/dL — ABNORMAL HIGH (ref 70–99)

## 2019-10-08 LAB — APTT: aPTT: 30 seconds (ref 24–36)

## 2019-10-08 LAB — POC SARS CORONAVIRUS 2 AG -  ED: SARS Coronavirus 2 Ag: NEGATIVE

## 2019-10-08 MED ORDER — INSULIN ASPART 100 UNIT/ML ~~LOC~~ SOLN
2.0000 [IU] | SUBCUTANEOUS | Status: DC
  Administered 2019-10-08 – 2019-10-09 (×4): 4 [IU] via SUBCUTANEOUS

## 2019-10-08 MED ORDER — SODIUM CHLORIDE 0.9% FLUSH
3.0000 mL | Freq: Two times a day (BID) | INTRAVENOUS | Status: DC
  Administered 2019-10-09 – 2019-10-23 (×24): 3 mL via INTRAVENOUS

## 2019-10-08 MED ORDER — SPIRONOLACTONE 25 MG PO TABS
25.0000 mg | ORAL_TABLET | Freq: Every day | ORAL | Status: DC
  Filled 2019-10-08: qty 1

## 2019-10-08 MED ORDER — SODIUM CHLORIDE 0.9% FLUSH: 3.0000 mL | Freq: Once | INTRAVENOUS | Status: DC

## 2019-10-08 MED ORDER — ONDANSETRON HCL 4 MG/2ML IJ SOLN
4.0000 mg | Freq: Four times a day (QID) | INTRAMUSCULAR | Status: DC | PRN
  Administered 2019-10-15: 4 mg via INTRAVENOUS

## 2019-10-08 MED ORDER — HEPARIN (PORCINE) 25000 UT/250ML-% IV SOLN
1850.0000 [IU]/h | INTRAVENOUS | Status: DC
  Administered 2019-10-08: 1050 [IU]/h via INTRAVENOUS
  Administered 2019-10-10: 1650 [IU]/h via INTRAVENOUS
  Administered 2019-10-10 – 2019-10-11 (×2): 1700 [IU]/h via INTRAVENOUS
  Administered 2019-10-12: 1850 [IU]/h via INTRAVENOUS
  Filled 2019-10-08 (×6): qty 250

## 2019-10-08 MED ORDER — BENAZEPRIL HCL 20 MG PO TABS
20.0000 mg | ORAL_TABLET | Freq: Every day | ORAL | Status: DC
  Administered 2019-10-08: 20 mg via ORAL
  Filled 2019-10-08 (×2): qty 1

## 2019-10-08 MED ORDER — HYDROCHLOROTHIAZIDE 25 MG PO TABS
25.0000 mg | ORAL_TABLET | Freq: Once | ORAL | Status: AC
  Administered 2019-10-08: 25 mg via ORAL
  Filled 2019-10-08: qty 1

## 2019-10-08 MED ORDER — AMLODIPINE BESYLATE 5 MG PO TABS
5.0000 mg | ORAL_TABLET | Freq: Once | ORAL | Status: AC
  Administered 2019-10-08: 5 mg via ORAL
  Filled 2019-10-08: qty 1

## 2019-10-08 MED ORDER — ACETAMINOPHEN 325 MG PO TABS: 650.0000 mg | ORAL_TABLET | ORAL | Status: DC | PRN

## 2019-10-08 MED ORDER — FENTANYL CITRATE (PF) 100 MCG/2ML IJ SOLN
50.0000 ug | INTRAMUSCULAR | Status: DC | PRN
  Administered 2019-10-08 – 2019-10-16 (×4): 50 ug via INTRAVENOUS
  Filled 2019-10-08 (×5): qty 2

## 2019-10-08 MED ORDER — LABETALOL HCL 5 MG/ML IV SOLN
0.5000 mg/min | INTRAVENOUS | Status: DC
  Administered 2019-10-08: 22:00:00 0.5 mg/min via INTRAVENOUS
  Administered 2019-10-09: 1 mg/min via INTRAVENOUS
  Administered 2019-10-09: 01:00:00 2.5 mg/min via INTRAVENOUS
  Administered 2019-10-10: 2 mg/min via INTRAVENOUS
  Administered 2019-10-10 (×4): 3 mg/min via INTRAVENOUS
  Administered 2019-10-11 (×3): 2 mg/min via INTRAVENOUS
  Administered 2019-10-12: 1.5 mg/min via INTRAVENOUS
  Administered 2019-10-13 (×2): 0.5 mg/min via INTRAVENOUS
  Administered 2019-10-13: 3 mg/min via INTRAVENOUS
  Administered 2019-10-14: 2.5 mg/min via INTRAVENOUS
  Administered 2019-10-15: 02:00:00 2 mg/min via INTRAVENOUS
  Administered 2019-10-16: 2.5 mg/min via INTRAVENOUS
  Administered 2019-10-16: 11:00:00 0.5 mg/min via INTRAVENOUS
  Administered 2019-10-17: 3 mg/min via INTRAVENOUS
  Administered 2019-10-17: 2.5 mg/min via INTRAVENOUS
  Administered 2019-10-17: 1 mg/min via INTRAVENOUS
  Filled 2019-10-08 (×3): qty 200
  Filled 2019-10-08: qty 100
  Filled 2019-10-08 (×5): qty 200
  Filled 2019-10-08: qty 100
  Filled 2019-10-08 (×3): qty 200
  Filled 2019-10-08: qty 100
  Filled 2019-10-08 (×2): qty 200
  Filled 2019-10-08 (×2): qty 100
  Filled 2019-10-08: qty 200
  Filled 2019-10-08: qty 100
  Filled 2019-10-08 (×6): qty 200
  Filled 2019-10-08: qty 100
  Filled 2019-10-08: qty 200
  Filled 2019-10-08: qty 100
  Filled 2019-10-08: qty 200

## 2019-10-08 MED ORDER — ESMOLOL HCL-SODIUM CHLORIDE 2000 MG/100ML IV SOLN
25.0000 ug/kg/min | INTRAVENOUS | Status: DC
  Administered 2019-10-08: 50 ug/kg/min via INTRAVENOUS
  Filled 2019-10-08 (×2): qty 100

## 2019-10-08 MED ORDER — HEPARIN (PORCINE) 25000 UT/250ML-% IV SOLN: 10.0000 [IU]/kg/h | INTRAVENOUS | Status: DC

## 2019-10-08 MED ORDER — LABETALOL HCL 5 MG/ML IV SOLN
20.0000 mg | Freq: Once | INTRAVENOUS | Status: AC
  Administered 2019-10-08: 20 mg via INTRAVENOUS
  Filled 2019-10-08: qty 4

## 2019-10-08 MED ORDER — IOHEXOL 350 MG/ML SOLN
75.0000 mL | Freq: Once | INTRAVENOUS | Status: AC | PRN
  Administered 2019-10-08: 75 mL via INTRAVENOUS

## 2019-10-08 MED ORDER — LABETALOL HCL 5 MG/ML IV SOLN: 20.0000 mg | Freq: Once | INTRAVENOUS | Status: DC

## 2019-10-08 MED ORDER — IOHEXOL 350 MG/ML SOLN
100.0000 mL | Freq: Once | INTRAVENOUS | Status: AC | PRN
  Administered 2019-10-08: 100 mL via INTRAVENOUS

## 2019-10-08 NOTE — Consult Note (Signed)
Hospital Consult    Reason for Consult:  Type B dissection Referring Physician:  Dr. Langston Masker (ED) MRN #:  [id number]  History of Present Illness: This is a 74 y.o. male has a history of chronic back pain including history of back surgery states that this morning he began having epigastric pain radiating into his chest and his back.  Tells me that the pain is 10 out of 10.  Does not have any difficulty breathing.  Denies any history of DVT or pulmonary embolus.  Does not have any known history of aneurysmal disease tells me that his sister had a brain aneurysm a few years ago.  He is a former smoker but quit many years ago no previous heart history.  No previous vascular surgery.  Past Medical History:  Diagnosis Date  . Chronic back pain    stenosis  . Enlarged prostate   . GERD (gastroesophageal reflux disease)    occasionally will take a zantac(maybe once a month)  . History of colon polyps   . History of kidney stones   . History of stress test    done 10 yrs. ago, as a baseline   . Hyperlipidemia    takes Crestor daily  . Hypertension    takes Amlodipine daily and Lotensin as well    Past Surgical History:  Procedure Laterality Date  . ANTERIOR LAT LUMBAR FUSION Left 12/15/2014   Procedure: ANTERIOR LATERAL LUMBAR FUSION 1 LEVEL;  Surgeon: Phylliss Bob, MD;  Location: [place];  Service: Orthopedics;  Laterality: Left;  Left sided lateral lumbar interbody fusion, lumbar 3-4, posterior spinal fusion, lumbar 3-4 with instrumentation.  . COLONOSCOPY    . ESOPHAGOGASTRODUODENOSCOPY    . fatty tissue removed from stomach    . LUMBAR LAMINECTOMY/DECOMPRESSION MICRODISCECTOMY N/A 07/08/2013   Procedure: LUMBAR LAMINECTOMY/DECOMPRESSION MICRODISCECTOMY;  Surgeon: Sinclair Ship, MD;  Location: [place];  Service: Orthopedics;  Laterality: N/A;  Lumbar 3-4, lumbar 4-5 decompression  . right ankle surgery     as child   . TONSILLECTOMY     as a child    Allergies  Allergen  Reactions  . Lyrica [Pregabalin] Nausea Only and Other (See Comments)    Hallucinations and dizziness, also  . Sulfa Antibiotics Other (See Comments)    Reaction not recalled     Prior to Admission medications   Medication Sig Start Date End Date Taking? Authorizing Provider  acetaminophen (TYLENOL) 500 MG tablet Take 1,000 mg by mouth every 6 (six) hours as needed for mild pain or headache.    Yes [provider]  amLODipine (NORVASC) 10 MG tablet Take 10 mg by mouth daily.  07/31/19  Yes [provider]  Ascorbic Acid (VITAMIN C) 500 MG CAPS Take 500-1,000 mg by mouth daily.   Yes [provider]  aspirin EC 81 MG tablet Take 81 mg by mouth daily.   Yes [provider]  benazepril-hydrochlorthiazide (LOTENSIN HCT) 20-25 MG per tablet Take 1 tablet by mouth at bedtime.    Yes [provider]  CALCIUM PO Take 1 tablet by mouth daily.   Yes [provider]  Cyanocobalamin (VITAMIN B-12 PO) Take 1 tablet by mouth daily.   Yes [provider]  HYDROcodone-acetaminophen (NORCO/VICODIN) 5-325 MG tablet Take 1-2 tablets by mouth See admin instructions. Take one to two tablets by mouth every 8-12 hours as needed for pain 09/28/19  Yes [provider]  meloxicam (MOBIC) 15 MG tablet Take 15 mg by mouth daily.  Yes [provider]  metFORMIN (GLUCOPHAGE) 500 MG tablet Take 500 mg by mouth 2 (two) times daily. 08/01/19  Yes [provider]  Multiple Vitamins-Minerals (MULTIVITAL PO) Take 1 tablet by mouth daily.   Yes [provider]  rosuvastatin (CRESTOR) 10 MG tablet Take 10 mg by mouth daily.   Yes [provider]    Social History   Socioeconomic History  . Marital status: Married    Spouse name: Not on file  . Number of children: Not on file  . Years of education: Not on file  . Highest education level: Not on file  Occupational History  . Not on file  Tobacco Use  . Smoking  status: Former Smoker    Packs/day: 0.25    Years: 40.00    Pack years: 10.00    Types: Cigarettes  . Tobacco comment: none since 06/24/13  Substance and Sexual Activity  . Alcohol use: No  . Drug use: No  . Sexual activity: Not on file  Other Topics Concern  . Not on file  Social History Narrative  . Not on file   Social Determinants of Health   Financial Resource Strain:   . Difficulty of Paying Living Expenses: Not on file  Food Insecurity:   . Worried About Charity fundraiser in the Last Year: Not on file  . Ran Out of Food in the Last Year: Not on file  Transportation Needs:   . Lack of Transportation (Medical): Not on file  . Lack of Transportation (Non-Medical): Not on file  Physical Activity:   . Days of Exercise per Week: Not on file  . Minutes of Exercise per Session: Not on file  Stress:   . Feeling of Stress : Not on file  Social Connections:   . Frequency of Communication with Friends and Family: Not on file  . Frequency of Social Gatherings with Friends and Family: Not on file  . Attends Religious Services: Not on file  . Active Member of Clubs or Organizations: Not on file  . Attends Archivist Meetings: Not on file  . Marital Status: Not on file  Intimate Partner Violence:   . Fear of Current or Ex-Partner: Not on file  . Emotionally Abused: Not on file  . Physically Abused: Not on file  . Sexually Abused: Not on file     No family history on file.  ROS: Cardiovascular: []  chest pain/pressure []  palpitations []  SOB lying flat []  DOE []  pain in legs while walking []  pain in legs at rest []  pain in legs at night []  non-healing ulcers []  hx of DVT []  swelling in legs  Pulmonary: []  productive cough []  asthma/wheezing []  home O2  Neurologic: []  weakness in []  arms []  legs []  numbness in []  arms []  legs []  hx of CVA []  mini stroke [] difficulty speaking or slurred speech []  temporary loss of vision in one eye []   dizziness  Hematologic: []  hx of cancer []  bleeding problems []  problems with blood clotting easily  Endocrine:   []  diabetes []  thyroid disease  GI []  vomiting blood [x]  abdominal pain  GU: []  CKD/renal failure []  HD--[]  M/W/F or []  T/T/S []  burning with urination []  blood in urine  Psychiatric: []  anxiety []  depression  Musculoskeletal: []  arthritis [x]  back pain  Integumentary: []  rashes []  ulcers  Constitutional: []  fever []  chills   Physical Examination  Vitals:   10/08/19 1854 10/08/19 1915  BP: (!) 158/102 (!) 177/108  Pulse: 91 78  Resp: (!) 27 (!) 24  Temp:    SpO2: 95% 96%   Body mass index is 27.48 kg/m.  General:  nad HENT: WNL, normocephalic Pulmonary: normal non-labored breathing Cardiac: Palpable radial, common femoral pulses 3+ palpable right popliteal pulse 2+ palpable left popliteal pulse Abdomen: soft, NT/ND, no masses Extremities: warm and well perfused Musculoskeletal: no muscle wasting or atrophy  Neurologic: A&O X 3; Appropriate Affect ; SENSATION: normal; MOTOR FUNCTION:  moving all extremities equally. Speech is fluent/normal   CBC    Component Value Date/Time   WBC 14.1 (H) 10/08/2019 1510   RBC 4.88 10/08/2019 1510   HGB 14.5 10/08/2019 1510   HCT 43.0 10/08/2019 1510   PLT 175 10/08/2019 1510   MCV 88.1 10/08/2019 1510   MCH 29.7 10/08/2019 1510   MCHC 33.7 10/08/2019 1510   RDW 13.7 10/08/2019 1510   LYMPHSABS 1.7 12/10/2014 1106   MONOABS 0.6 12/10/2014 1106   EOSABS 0.2 12/10/2014 1106   BASOSABS 0.0 12/10/2014 1106    BMET    Component Value Date/Time   NA 139 10/08/2019 1510   K 3.3 (L) 10/08/2019 1510   CL 102 10/08/2019 1510   CO2 23 10/08/2019 1510   GLUCOSE 170 (H) 10/08/2019 1510   BUN 12 10/08/2019 1510   CREATININE 1.25 (H) 10/08/2019 1510   CALCIUM 9.7 10/08/2019 1510   GFRNONAA 56 (L) 10/08/2019 1510   GFRAA >60 10/08/2019 1510    COAGS: Lab Results  Component Value Date   INR 1.06  12/10/2014   INR 1.03 06/30/2013     Non-Invasive Vascular Imaging:   CTA IMPRESSION: 1. 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 2. 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 There is a small amount of periaortic hematoma along the distal descending thoracic aorta. Vascular surgery consult is advised. 3. Small pulmonary embolus involving the central right upper lobe pulmonary artery as well as probable additional small subsegmental embolus in the right lower lobe. 4. Ovoid soft tissue density posterior to the left common iliac vein, indeterminate, possibly an enlarged lymph node or a neurogenic tumor. This can be better evaluated with MRI on a nonemergent basis. 5. Additional findings as above.  ASSESSMENT/PLAN: This is a 74 y.o. male presents with distal thoracic aortic dissection with symptoms of epigastric pain radiating to his back.  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  Also has pulmonary embolus although appears to be asymptomatic at this time.  Recommend admission to ICU for medical management of thoracic dissection and ? Anticoagulation for acute PE.  Will order popliteal artery duplexes tomorrow morning and notify Dr. Lynann Bologna of patient admission.  Rossi Burdo C. Donzetta Matters, MD [facility] [place] Office: [phone] Pager: [phone]

## 2019-10-08 NOTE — Progress Notes (Signed)
ANTICOAGULATION CONSULT NOTE - Initial Consult  Pharmacy Consult for heparin IV Indication: PE  Allergies  Allergen Reactions  . Lyrica [Pregabalin] Nausea Only and Other (See Comments)    Hallucinations and dizziness, also  . Sulfa Antibiotics Other (See Comments)    Reaction not recalled     Patient Measurements: Height: 5\' 11"  (180.3 cm) Weight: 197 lb (89.4 kg) IBW/kg (Calculated) : 75.3 Heparin Dosing Weight: 89.4 kg  Vital Signs: Temp: 99.1 F (37.3 C) (02/18 1440) Temp Source: Oral (02/18 1440) BP: 153/102 (02/18 2030) Pulse Rate: 71 (02/18 1946)  Labs: Recent Labs    10/08/19 1510 10/08/19 1750  HGB 14.5  --   HCT 43.0  --   PLT 175  --   CREATININE 1.25*  --   TROPONINIHS 28* 34*    Estimated Creatinine Clearance: 55.2 mL/min (A) (by C-G formula based on SCr of 1.25 mg/dL (H)).   Medical History: Past Medical History:  Diagnosis Date  . Chronic back pain    stenosis  . Enlarged prostate   . GERD (gastroesophageal reflux disease)    occasionally will take a zantac(maybe once a month)  . History of colon polyps   . History of kidney stones   . History of stress test    done 10 yrs. ago, as a baseline   . Hyperlipidemia    takes Crestor daily  . Hypertension    takes Amlodipine daily and Lotensin as well    Medications:  Scheduled:  . benazepril  20 mg Oral Daily  . insulin aspart  2-6 Units Subcutaneous Q4H  . sodium chloride flush  3 mL Intravenous Once  . sodium chloride flush  3 mL Intravenous Q12H   Infusions:  . heparin    . labetalol (NORMODYNE) infusion 0.5 mg/min (10/08/19 2131)    Assessment: 74 y/o male presenting with lower chest and epigastric pain, found to have descending aortic dissection with small periaortic hematoma and pulmonary embolism. Patient is not taking anticoagulation PTA. Pharmacy has been consulted to dose heparin IV infusion for PE.   Hemoglobin and platelets WNL. No other bleeding noted at this time. Will  aim for lower heparin level goal and avoid bolus at this time with high risk of bleeding.  Goal of Therapy:  Heparin level 0.3-0.5 units/ml Monitor platelets by anticoagulation protocol: Yes   Plan:  Start heparin infusion at 1050 units/hr Check anti-Xa level in 8 hours and daily while on heparin Continue to monitor H&H and platelets  Monitor for s/sx of bleeding  Agnes Lawrence, PharmD PGY1 Pharmacy Resident

## 2019-10-08 NOTE — ED Notes (Signed)
Pt leans to R when attempts to sit up to urinate.  Unable to WB without standby assist, states has not been amb at home d/t his back.  Condom cath applied for urine collection.  Cardiology completed exam.

## 2019-10-08 NOTE — ED Triage Notes (Signed)
Pt states he started having intermittent cp/SOB for 3 weeks. Pt also states he has had some n/v as well.

## 2019-10-08 NOTE — ED Provider Notes (Signed)
[name] Provider Note   CSN: [id number] Arrival date & time: 10/08/19  1433     History Chief Complaint  Patient presents with   Chest Pain   Shortness of Breath    [name] [name] is a 74 y.o. male w/ hx of chronic back pain, HTN, HLD, "borderline" diabetes, presenting to the ED with chest pain.  Patient provides a vague history but his wife [name] [name] was able to supplement his history.  The patient has reportedly been feeling "weak" and "off" for several weeks.  He describes generalized fatigue.  The past few days he's felt nauseous and had some episodes of vomiting.  He has a diminished appetite.  This is not associated with activity, although his wife notes that he looked "sweaty" yesterday while walking in the apartment.  She thinks he may have a fever but did not check a temperature.  He denies cough, congestion.  Neither of them have tested positive for COVID, and his wife has no active symptoms.  This morning around 9-10 am while sitting at the breakfast table, he began having 8/10 chest pain.  He has difficulty describing the pain to me, or qualifying it, other than to gesture towards his lower sternum and epigastrum.  He says the pain has completely gone away since then.    He denies any know cardiac hx, or hx of MI or stents.  Denies smoking hx.  Denies significant family hx (father had MI in his 65's).  He does not follow with a cardiologist and cannot recall ever having a cardiac evaluation.  Currently in the ED he is asymptomatic, feels well.  HPI     Past Medical History:  Diagnosis Date   Chronic back pain    stenosis   Enlarged prostate    GERD (gastroesophageal reflux disease)    occasionally will take a zantac(maybe once a month)   History of colon polyps    History of kidney stones    History of stress test    done 10 yrs. 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   COLONOSCOPY     ESOPHAGOGASTRODUODENOSCOPY     fatty tissue removed from stomach     LUMBAR LAMINECTOMY/DECOMPRESSION MICRODISCECTOMY N/A 07/08/2013   Procedure: LUMBAR LAMINECTOMY/DECOMPRESSION MICRODISCECTOMY;  Surgeon: Sinclair Ship, MD;  Location: [facility];  Service: Orthopedics;  Laterality: N/A;  Lumbar 3-4, lumbar 4-5 decompression   right ankle surgery     as child    TONSILLECTOMY     as a child       No family history on file.  Social History   Tobacco Use   Smoking status: Former Smoker    Packs/day: 0.25    Years: 40.00    Pack years: 10.00    Types: Cigarettes   Tobacco comment: none since 06/24/13  Substance Use Topics   Alcohol use: No  Drug use: No    Home Medications Prior to Admission medications   Medication Sig Start Date End Date Taking? Authorizing Provider  acetaminophen (TYLENOL) 500 MG tablet Take 1,000 mg by mouth every 6 (six) hours as needed for mild pain or headache.    Yes [provider]  amLODipine (NORVASC) 10 MG tablet Take 10 mg by mouth daily.  07/31/19  Yes [provider]  Ascorbic Acid (VITAMIN C) 500 MG CAPS Take 500-1,000 mg by mouth daily.   Yes [provider]  aspirin EC 81 MG tablet Take 81 mg by mouth daily.   Yes [provider]  benazepril-hydrochlorthiazide (LOTENSIN HCT) 20-25 MG per tablet Take 1 tablet by mouth at bedtime.    Yes [provider]  CALCIUM PO Take 1 tablet by mouth daily.   Yes [provider]  Cyanocobalamin (VITAMIN B-12 PO) Take 1 tablet by mouth daily.   Yes [provider]  HYDROcodone-acetaminophen (NORCO/VICODIN) 5-325 MG tablet Take 1-2 tablets by mouth See admin instructions. Take one to two tablets by mouth every 8-12 hours as needed for pain 09/28/19  Yes [provider]  meloxicam (MOBIC) 15 MG tablet Take 15 mg by mouth daily.   Yes [provider]  metFORMIN (GLUCOPHAGE) 500 MG tablet Take 500 mg by mouth 2 (two) times daily. 08/01/19  Yes [provider]  Multiple Vitamins-Minerals (MULTIVITAL PO) Take 1 tablet by mouth daily.   Yes [provider]  rosuvastatin (CRESTOR) 10 MG tablet Take 10 mg by mouth daily.   Yes [provider]    Allergies    Lyrica [pregabalin] and Sulfa antibiotics  Review of Systems   Review of Systems  Constitutional: Positive for appetite change, fatigue and fever.  Respiratory: Negative for cough and shortness of breath.   Cardiovascular: Positive for chest pain. Negative for palpitations.  Gastrointestinal: Positive for nausea and vomiting. Negative for abdominal pain.  Genitourinary: Negative for difficulty urinating and hematuria.  Skin: Negative for pallor and rash.  Neurological: Negative for syncope, light-headedness and headaches.  All other systems reviewed and are negative.   Physical Exam Updated Vital Signs BP 108/77    Pulse 66    Temp 99.8 F (37.7 C) (Oral)    Resp (!) 21    Ht 5\' 11"  (1.803 m)    Wt 86.9 kg    SpO2 97%    BMI 26.72 kg/m   Physical Exam Vitals and nursing note reviewed.  Constitutional:      Appearance: He is well-developed.  HENT:     Head: Normocephalic and atraumatic.  Eyes:     Conjunctiva/sclera: Conjunctivae normal.  Cardiovascular:     Rate and Rhythm: Normal rate and regular rhythm.     Heart sounds: No murmur.  Pulmonary:     Effort: Pulmonary  effort is normal. No respiratory distress.     Breath sounds: Normal breath sounds.  Abdominal:     Palpations: Abdomen is soft.     Tenderness: There is abdominal tenderness in the epigastric area. There is no guarding or rebound. Negative signs include Murphy's sign and McBurney's sign.  Musculoskeletal:     Cervical back: Neck supple.  Skin:    General: Skin is warm and dry.  Neurological:     General: No focal deficit present.     Mental Status: He is alert and oriented to [place], place, and time.     ED Results / Procedures / Treatments   Labs (  all labs ordered are listed, but only abnormal results are displayed) Labs Reviewed  BASIC METABOLIC PANEL - Abnormal; Notable for the following components:      Result Value   Potassium 3.3 (*)    Glucose, Bld 170 (*)    Creatinine, Ser 1.25 (*)    GFR calc non Af Amer 56 (*)    All other components within normal limits  CBC - Abnormal; Notable for the following components:   WBC 14.1 (*)    All other components within normal limits  CBG MONITORING, ED - Abnormal; Notable for the following components:   Glucose-Capillary 165 (*)    All other components within normal limits  CBG MONITORING, ED - Abnormal; Notable for the following components:   Glucose-Capillary 163 (*)    All other components within normal limits  TROPONIN I (HIGH SENSITIVITY) - Abnormal; Notable for the following components:   Troponin I (High Sensitivity) 28 (*)    All other components within normal limits  TROPONIN I (HIGH SENSITIVITY) - Abnormal; Notable for the following components:   Troponin I (High Sensitivity) 34 (*)    All other components within normal limits  SARS CORONAVIRUS 2 (TAT 6-24 HRS)  RESPIRATORY PANEL BY RT PCR (FLU A&B, COVID)  MRSA PCR SCREENING  APTT  PROTIME-INR  ALDOSTERONE + RENIN ACTIVITY W/ RATIO  BASIC METABOLIC PANEL  CBC  HEPARIN LEVEL (UNFRACTIONATED)  POC SARS CORONAVIRUS 2 AG -  ED    EKG EKG  Interpretation  Date/Time:  Thursday October 08 2019 14:39:20 EST Ventricular Rate:  101 PR Interval:  170 QRS Duration: 70 QT Interval:  318 QTC Calculation: 412 R Axis:   46 Text Interpretation: Sinus tachycardia with Premature atrial complexes Minimal aVR elevation, does not meet STEMI criteria Abnormal ECG Confirmed by Octaviano Glow [phone]) on 10/08/2019 4:50:02 PM   Radiology DG Chest Portable 1 View  Result Date: 10/08/2019 CLINICAL DATA:  Chest pain. EXAM: PORTABLE CHEST 1 VIEW COMPARISON:  December 10, 2014 FINDINGS: Cardiomegaly. Probable atelectasis in the left base. The heart, hila, mediastinum, lungs, and pleura are otherwise unchanged since December 10, 2014. IMPRESSION: No active disease. Electronically Signed   By: Dorise Bullion III M.D   On: 10/08/2019 15:44   CT Angio Chest/Abd/Pel for Dissection W and/or Wo Contrast  Result Date: 10/08/2019 CLINICAL DATA:  74 year old male with thoracic aortic aneurysm presenting with chest and abdominal pain. Concern for dissection. EXAM: CT ANGIOGRAPHY CHEST, ABDOMEN AND PELVIS TECHNIQUE: Multidetector CT imaging through the chest, abdomen and pelvis was performed using the standard protocol during bolus administration of intravenous contrast. Multiplanar reconstructed images and MIPs were obtained and reviewed to evaluate the vascular anatomy. CONTRAST:  143mL OMNIPAQUE IOHEXOL 350 MG/ML SOLN COMPARISON:  Chest radiograph dated 10/08/2019 and CT abdomen pelvis dated 02/18/2005. FINDINGS: CTA CHEST FINDINGS Cardiovascular: There is mild cardiomegaly. No pericardial effusion. Coronary vascular calcification primarily involving the LAD and likely to a lesser degree RCA and left circumflex artery. There is aneurysmal dilatation of the thoracic aorta. The ascending aorta measures approximately 4.6 cm in diameter. The aortic isthmus measures approximately 4.6 cm in diameter. The descending thoracic aorta measures approximately 5 cm in diameter in the  midportion and 4 cm distally. There is mild to moderate noncalcified plaque of the descending thoracic aorta. There is dissection of the distal descending thoracic aorta with the extension of the dissection flap into the abdominal aorta. There is a small amount of periaortic hematoma adjacent to the distal descending thoracic  aorta. There is no dissection of the ascending aorta or aortic arch. The origins of the great vessels of the aortic arch appear patent. Evaluation of the pulmonary arteries is limited due to suboptimal opacification and timing of the contrast. There is pulmonary artery embolus in the central right upper lobe pulmonary artery (series 6, image 35). A small pulmonary embolus is also likely present in the right lower lobe subsegmental branch (series 6, image 56). Mediastinum/Nodes: There is no hilar or mediastinal adenopathy. The esophagus is grossly unremarkable. No mediastinal fluid collection. Lungs/Pleura: There is subsegmental left lung base atelectasis. There is no focal consolidation, pleural effusion, or pneumothorax. The central airways are patent. Musculoskeletal: No chest wall abnormality. No acute or significant osseous findings. Review of the MIP images confirms the above findings. CTA ABDOMEN AND PELVIS FINDINGS VASCULAR Aorta: There is aneurysmal dilatation of the suprarenal abdominal aorta measuring up to approximately 4 cm (sagittal series 10, image 98). There is a partially thrombosed fusiform aneurysm of the distal infrarenal aorta measuring approximately 3.7 cm in diameter (sagittal series 10 image 105 and coronal series 9, image 72). There is a dissection flap contiguous with the thoracic aortic dissection and extends down into the distal infrarenal abdominal aorta approximately 2.5 cm above the aortic bifurcation. No significant periaortic hematoma identified. Celiac: There is apparent extension of the dissection along the right wall of the the celiac artery with narrowing of  the lumen of the vessel by greater than 50%. There is also narrowing of the splenic and hepatic arteries likely secondary to dissection and less likely due to noncalcified plaque. The major vascular branches of the celiac axis however remain patent. SMA: There is extension of the dissection flap into the SMA. The SMA remains patent. Renals: There is extension of the dissection into the ostia of the renal arteries bilaterally. The renal arteries remain patent. IMA: The IMA is patent. Inflow: The common, internal and external iliac arteries are patent bilaterally. Veins: The IVC is grossly unremarkable.  No portal venous gas. Review of the MIP images confirms the above findings. NON-VASCULAR There is no intra-abdominal free air or free fluid. Hepatobiliary: There is diffuse fatty infiltration of the liver. No intrahepatic biliary ductal dilatation. No gallstone. Pancreas: Unremarkable. No pancreatic ductal dilatation or surrounding inflammatory changes. Spleen: Normal in size without focal abnormality. Adrenals/Urinary Tract: Left adrenal thickening. The right adrenal gland is unremarkable. There is no hydronephrosis on either side. The visualized ureters appear unremarkable. High attenuating content within the urinary bladder likely represents some excreted contrast. Blood product is less likely. Stomach/Bowel: Several small sigmoid diverticula without active inflammatory changes. There is no bowel obstruction or active inflammation. The appendix is normal. Lymphatic: A 3.1 x 2.6 cm ovoid soft tissue density posterior to the left common iliac vein and anterior to the sacrum (series 6, image 156) is suboptimally characterized but may represent an enlarged lymph node. Other etiologies include a neurogenic tumor. This can be better evaluated with MRI on a nonemergent/outpatient basis. Reproductive: Enlarged prostate gland with median lobe hypertrophy indenting and protruding into the base of the bladder. The prostate  measures approximately 6.2 cm in transverse axial diameter. Other: None Musculoskeletal: Degenerative changes of the spine. Lower lumbar laminectomy and posterior fusion. No acute osseous pathology. Review of the MIP images confirms the above findings. IMPRESSION: 1. 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 2. Long segment aortic dissection starting in the distal descending thoracic aorta and extending to the  distal infrarenal abdominal aorta. There is a small amount of periaortic hematoma along the distal descending thoracic aorta. Vascular surgery consult is advised. 3. Small pulmonary embolus involving the central right upper lobe pulmonary artery as well as probable additional small subsegmental embolus in the right lower lobe. 4. Ovoid soft tissue density posterior to the left common iliac vein, indeterminate, possibly an enlarged lymph node or a neurogenic tumor. This can be better evaluated with MRI on a nonemergent basis. 5. Additional findings as above. These results were called by telephone at the time of interpretation on 10/08/2019 at 7:02 pm to provider Keina Mutch , who verbally acknowledged these results. Electronically Signed   By: Anner Crete M.D.   On: 10/08/2019 19:29    Procedures Ultrasound ED Echo  Date/Time: 10/08/2019 4:58 PM Performed by: Wyvonnia Dusky, MD Authorized by: Wyvonnia Dusky, MD   Procedure details:    Indications: chest pain     Views: parasternal long axis view and apical 4 chamber view     Images: archived     Limitations:  Body habitus and acoustic shadowing Findings:    Pericardium: no pericardial effusion   Impression:    Impression comment:  No pericardial effusion, widened aortic outlet measuring 4.3 cm without visualization of dissection flap  .Critical Care Performed by: Wyvonnia Dusky, MD Authorized by: Wyvonnia Dusky, MD   Critical care provider statement:    Critical care time  (minutes):  50   Critical care was necessary to treat or prevent imminent or life-threatening deterioration of the following conditions:  Circulatory failure   Critical care was time spent personally by me on the following activities:  Discussions with consultants, evaluation of patient's response to treatment, examination of patient, ordering and performing treatments and interventions, ordering and review of laboratory studies, ordering and review of radiographic studies, pulse oximetry, re-evaluation of patient's condition, obtaining history from patient or surrogate and review of old charts Comments:     Acute aortic dissection requiring IV blood pressure and rate controlling medications, discussion with consultants, and frequent bedside reassessments   (including critical care time)  Medications Ordered in ED Medications  sodium chloride flush (NS) 0.9 % injection 3 mL (3 mLs Intravenous Not Given 10/08/19 1709)  benazepril (LOTENSIN) tablet 20 mg (20 mg Oral Given 10/08/19 1732)  sodium chloride flush (NS) 0.9 % injection 3 mL (3 mLs Intravenous Not Given 10/08/19 2314)  labetalol (NORMODYNE) 500 mg in dextrose 5 % 125 mL (4 mg/mL) infusion (2.5 mg/min Intravenous Rate/Dose Change 10/08/19 2140)  acetaminophen (TYLENOL) tablet 650 mg (has no administration in time range)  ondansetron (ZOFRAN) injection 4 mg (has no administration in time range)  fentaNYL (SUBLIMAZE) injection 50 mcg (50 mcg Intravenous Given 10/08/19 2145)  insulin aspart (novoLOG) injection 2-6 Units (4 Units Subcutaneous Given 10/09/19 0005)  heparin ADULT infusion 100 units/mL (25000 units/245mL sodium chloride 0.45%) (1,050 Units/hr Intravenous New Bag/Given 10/08/19 2148)  amLODipine (NORVASC) tablet 5 mg (5 mg Oral Given 10/08/19 1707)  hydrochlorothiazide (HYDRODIURIL) tablet 25 mg (25 mg Oral Given 10/08/19 1707)  labetalol (NORMODYNE) injection 20 mg (20 mg Intravenous Given 10/08/19 1850)  iohexol (OMNIPAQUE) 350 MG/ML  injection 100 mL (100 mLs Intravenous Contrast Given 10/08/19 1846)  iohexol (OMNIPAQUE) 350 MG/ML injection 75 mL (75 mLs Intravenous Contrast Given 10/08/19 2111)    ED Course  I have reviewed the triage vital signs and the nursing notes.  Pertinent labs & imaging results that were available during my  care of the patient were reviewed by me and considered in my medical decision making (see chart for details).  74 yo male here with CP occurring today around 0900 while at rest, now spontaneously resolved Reports subjective fevers and nausea this whole week, generalized fatigue  ECG with minor 1 mm elevation in aVR, it's not clear if this is a new or significant change from prior 2016 ecg, but I discussed with Dr Einar Gip as noted below.  We'll await trops, continue to monitor, he is pain free currently.  Ddx also includes COVID 19 with constellation of symptoms, we'll check labs and an xray   Kathryn was evaluated in Emergency Department on 10/09/2019 for the symptoms described in the history of present illness. He was evaluated in the context of the global COVID-19 pandemic, which necessitated consideration that the patient might be at risk for infection with the SARS-CoV-2 virus that causes COVID-19. Institutional protocols and algorithms that pertain to the evaluation of patients at risk for COVID-19 are in a state of rapid change based on information released by regulatory bodies including the CDC and federal and state organizations. These policies and algorithms were followed during the patient's care in the ED.   Clinical Course as of Oct 08 40  Thu Oct 08, 2019  1534 I spoke to Dr Einar Gip of cardiology regarding the isolated ST elevation in aVR (approx 1 mm), questionable ST depressions in other leads.  He notes similar pattern on prior 2016 ecg, and at this time does not believe this is ACS based on this ecg.  The patient is pain free currently.  We'll follow up on trops and reassess  after workup is complete   [MT]  1627 Troponin I (High Sensitivity)(!): 28 [MT]  1659 Bedside echo with widened aorta 4.3 cm, suggestive of aneurysm.  Given his CP, HTN, and aneurysm, we'll obtain a dissection study.  I'll include the abdomen as he does complain of epigastric discomfort.  I also ordered him home BP medications which he has not yet taken today.   [MT]  1804 Patient's wife updated by phone, plan to admit patient.  Awaiting CTA and 2nd troponin.   [MT]  [id number] Appears to be a type a dissection per my read of the CT, awaiting images to upload for rads read, we'll give 20 mg IV labetalol for BP control, contact CT surgery.  Patient is feeling "fine," no pain, appears comfortable   [MT]  1849 Paged dr Orvan Seen CT surgery   [MT]  1856 His wife louise was updated, as was the patient, regarding the diagnosis   [MT]  1907 Spoke to dr Orvan Seen, he will evaluate pt, requesting vascular surgery be consulted as well as this may be descending only and medical mgmt   [MT]  1915 I spoke to Dr Donzetta Matters of vascular, recommending BP control, admission to critical care, if dissection is strictly descending will be medical management, not CT surgery.  Awaiting radiologist clarification on dissection type.  Also noted are possible PE on imaging   [MT]  1934 Paged critical care for admission.  Two small PE, will hold off on A/C now until vascular team has made recommendations   [MT]  1942 Spoke to crit care E link   [MT]    Clinical Course User Index [MT] Majel Giel, Carola Rhine, MD    Final Clinical Impression(s) / ED Diagnoses Final diagnoses:  Dissection of thoracoabdominal aorta [facility])    Rx / DC Orders ED Discharge Orders  None       Wyvonnia Dusky, MD 10/09/19 [phone]

## 2019-10-08 NOTE — Consult Note (Addendum)
CARDIOLOGY CONSULT NOTE  Patient ID: [name] [name] MRN: [id number] DOB/AGE: October 11, 1945 74 y.o.  Admit date: 10/08/2019 Referring Physician: [facility] Reason for Consultation:  Chest pain  HPI:   74 y.o. African American male  with hypertension, hyperlipidemia, presented to the ED with 3 days of chest pain and shortness of breath.   Patient has been complaining of lower chest and abdominal pain and shortness of breath for last 2-3 days, with no significant change. He has had unchanged chest pain while in the ED. He also had one episode of profuse sweating Tuesday 2/16 night, along with nausea and vomiting. He reports night sweats in low grade temp in the last few days. No known COVID contact. COVID testing negative in the hospital. Patient has been hypertensive in the ED with BP in 180s/130s. EKG shows nonspecific ST-T changes, not significantly different from prior EKG in 2016. First trop HS is 28. Workup also shows persistent hypokalemia over the years, with Cr at 1.25.   He has had back pain from spinal stenosis. He is supposed to undergo spine surgery on March 14. He has had bilateral lower leg weakness, ambulates for short distances.    Past Medical History:  Diagnosis Date  . Chronic back pain    stenosis  . Enlarged prostate   . GERD (gastroesophageal reflux disease)    occasionally will take a zantac(maybe once a month)  . History of colon polyps   . History of kidney stones   . History of stress test    done 10 yrs. ago, as a baseline   . Hyperlipidemia    takes Crestor daily  . Hypertension    takes Amlodipine daily and Lotensin as well     Past Surgical History:  Procedure Laterality Date  . ANTERIOR LAT LUMBAR FUSION Left 12/15/2014   Procedure: ANTERIOR LATERAL LUMBAR FUSION 1 LEVEL;  Surgeon: Phylliss Bob, MD;  Location: [facility];  Service: Orthopedics;  Laterality: Left;  Left sided lateral lumbar interbody fusion, lumbar 3-4, posterior spinal fusion, lumbar  3-4 with instrumentation.  . COLONOSCOPY    . ESOPHAGOGASTRODUODENOSCOPY    . fatty tissue removed from stomach    . LUMBAR LAMINECTOMY/DECOMPRESSION MICRODISCECTOMY N/A 07/08/2013   Procedure: LUMBAR LAMINECTOMY/DECOMPRESSION MICRODISCECTOMY;  Surgeon: Sinclair Ship, MD;  Location: [facility];  Service: Orthopedics;  Laterality: N/A;  Lumbar 3-4, lumbar 4-5 decompression  . right ankle surgery     as child   . TONSILLECTOMY     as a child     No family history on file.   Social History: Social History   Socioeconomic History  . Marital status: Married    Spouse name: Not on file  . Number of children: Not on file  . Years of education: Not on file  . Highest education level: Not on file  Occupational History  . Not on file  Tobacco Use  . Smoking status: Former Smoker    Packs/day: 0.25    Years: 40.00    Pack years: 10.00    Types: Cigarettes  . Tobacco comment: none since 06/24/13  Substance and Sexual Activity  . Alcohol use: No  . Drug use: No  . Sexual activity: Not on file  Other Topics Concern  . Not on file  Social History Narrative  . Not on file   Social Determinants of Health   Financial Resource Strain:   . Difficulty of Paying Living Expenses: Not on file  Food Insecurity:   .  Worried About Charity fundraiser in the Last Year: Not on file  . Ran Out of Food in the Last Year: Not on file  Transportation Needs:   . Lack of Transportation (Medical): Not on file  . Lack of Transportation (Non-Medical): Not on file  Physical Activity:   . Days of Exercise per Week: Not on file  . Minutes of Exercise per Session: Not on file  Stress:   . Feeling of Stress : Not on file  Social Connections:   . Frequency of Communication with Friends and Family: Not on file  . Frequency of Social Gatherings with Friends and Family: Not on file  . Attends Religious Services: Not on file  . Active Member of Clubs or Organizations: Not on file  . Attends Theatre manager Meetings: Not on file  . Marital Status: Not on file  Intimate Partner Violence:   . Fear of Current or Ex-Partner: Not on file  . Emotionally Abused: Not on file  . Physically Abused: Not on file  . Sexually Abused: Not on file     (Not in a hospital admission)   Review of Systems  Constitution: Negative for decreased appetite, malaise/fatigue, weight gain and weight loss.  HENT: Negative for congestion.   Eyes: Negative for visual disturbance.  Cardiovascular: Positive for chest pain. Negative for dyspnea on exertion, leg swelling, palpitations and syncope.  Respiratory: Positive for shortness of breath. Negative for cough.   Endocrine: Negative for cold intolerance.  Hematologic/Lymphatic: Does not bruise/bleed easily.  Skin: Negative for itching and rash.  Musculoskeletal: Positive for back pain. Negative for myalgias.  Gastrointestinal: Negative for abdominal pain, nausea and vomiting.  Genitourinary: Negative for dysuria.  Neurological: Negative for dizziness and weakness.  Psychiatric/Behavioral: The patient is not nervous/anxious.   All other systems reviewed and are negative.     Physical Exam: Physical Exam  Constitutional: He is oriented to [place], place, and time. He appears well-developed and well-nourished. No distress.  HENT:  Head: Normocephalic and atraumatic.  Eyes: Pupils are equal, round, and reactive to light. Conjunctivae are normal.  Neck: No JVD present.  Cardiovascular: Normal rate, regular rhythm and intact distal pulses.  Pulmonary/Chest: Effort normal and breath sounds normal. He has no wheezes. He has no rales.  Abdominal: Soft. Bowel sounds are normal. There is no rebound.  Musculoskeletal:        General: No edema.  Lymphadenopathy:    He has no cervical adenopathy.  Neurological: He is alert and oriented to [place], place, and time. No cranial nerve deficit.  Skin: Skin is warm and dry.  Psychiatric: He has a normal mood and  affect.  Nursing note and vitals reviewed.    Labs:   Lab Results  Component Value Date   WBC 14.1 (H) 10/08/2019   HGB 14.5 10/08/2019   HCT 43.0 10/08/2019   MCV 88.1 10/08/2019   PLT 175 10/08/2019    Recent Labs  Lab 10/08/19 1510  NA 139  K 3.3*  CL 102  CO2 23  BUN 12  CREATININE 1.25*  CALCIUM 9.7  GLUCOSE 170*     Radiology: DG Chest Portable 1 View  Result Date: 10/08/2019 CLINICAL DATA:  Chest pain. EXAM: PORTABLE CHEST 1 VIEW COMPARISON:  December 10, 2014 FINDINGS: Cardiomegaly. Probable atelectasis in the left base. The heart, hila, mediastinum, lungs, and pleura are otherwise unchanged since December 10, 2014. IMPRESSION: No active disease. Electronically Signed   By: Dorise Bullion III M.D  On: 10/08/2019 15:44    Scheduled Meds: . benazepril  20 mg Oral Daily  . sodium chloride flush  3 mL Intravenous Once  . sodium chloride flush  3 mL Intravenous Q12H   Continuous Infusions: PRN Meds:.  CARDIAC STUDIES:  EKG 10/08/2019: Sinus rhythm 94 bpm. Occasional PAC. Nonspecific ST-T changes inferolateral leads  Echocardiogram: None   Assessment & Recommendations:  74 y.o. African American male  with hypertension, hyperlipidemia, spinal stenosis, back pain, presented with chest pain, troponin HS mildly elevated.  Chest pain, trop elevation: Chest pain atypical for ACS, Pain is reproducible on his abdomen and lower chest. EKG with nonspecific changes. CXR unremarkable. Trop HS 28, most likely due to supply demand mismatch in the setting of hypertensive urgency. Await second trop. If delta is <20, do not think this is ACS. Will arrange outpatient stress test and echocardiogram, and outpatient f/u.  Pain etiology is unclear. Defer to ED.  Shortness of breath: No heart failure on exam. CXR unremarkable. Sats normal. Not overtly tachycardia. Suspect this may also be related to hypertensive urgency.   Hypertensive urgency: Partly triggered by his back  pain. Home BP lower than this. He has chronic hypokalemia. Suspect hyperaldosteronism. Check renin/aldosterone. Will start on spironolactone 25 mg daily.   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 Needs aggressive HR, BP control. Agree with esmolol drip. CVTS consult pending.    Nigel Mormon, MD 10/08/2019, 5:29 PM [facility] Pager: [phone] Office: [phone] If no answer Cell [phone]

## 2019-10-08 NOTE — H&P (Signed)
NAME:  [name] [name], MRN:  [id number], DOB:  April 15, 1946, LOS: 0 ADMISSION DATE:  10/08/2019, CONSULTATION DATE:  10/08/2019 REFERRING MD:  Langston Masker - [facility], CHIEF COMPLAINT:  Aortic dissection with hypertensive emergency.   HPI/course in hospital  74 year old man with 10/10 lower chest and epigastric pain. Has been on/off for several weeks, with worst episode today. States he has felt as though his legs have been about to give out on him. Has been complaining of back pain as well and is scheduled for spinal stenosis surgery but now states that now both th back and abdominal pains are simultaneous.  In ED started on esmolol infusion. Seen by Vascular Sx who recommend medical management.  Past Medical History   Past Medical History:  Diagnosis Date  . Chronic back pain    stenosis  . Enlarged prostate   . GERD (gastroesophageal reflux disease)    occasionally will take a zantac(maybe once a month)  . History of colon polyps   . History of kidney stones   . History of stress test    done 10 yrs. ago, as a baseline   . Hyperlipidemia    takes Crestor daily  . Hypertension    takes Amlodipine daily and Lotensin as well     Past Surgical History:  Procedure Laterality Date  . ANTERIOR LAT LUMBAR FUSION Left 12/15/2014   Procedure: ANTERIOR LATERAL LUMBAR FUSION 1 LEVEL;  Surgeon: Phylliss Bob, MD;  Location: [place];  Service: Orthopedics;  Laterality: Left;  Left sided lateral lumbar interbody fusion, lumbar 3-4, posterior spinal fusion, lumbar 3-4 with instrumentation.  . COLONOSCOPY    . ESOPHAGOGASTRODUODENOSCOPY    . fatty tissue removed from stomach    . LUMBAR LAMINECTOMY/DECOMPRESSION MICRODISCECTOMY N/A 07/08/2013   Procedure: LUMBAR LAMINECTOMY/DECOMPRESSION MICRODISCECTOMY;  Surgeon: Sinclair Ship, MD;  Location: [place];  Service: Orthopedics;  Laterality: N/A;  Lumbar 3-4, lumbar 4-5 decompression  . right ankle surgery     as child   . TONSILLECTOMY     as a child      Review of Systems:   Review of Systems  Constitutional: Negative.   HENT: Negative.   Eyes: Negative.   Respiratory: Negative.  Negative for cough, hemoptysis and shortness of breath.   Cardiovascular: Positive for chest pain.  Gastrointestinal: Positive for constipation.  Genitourinary: Positive for frequency.  Musculoskeletal: Negative.   Skin: Negative.   Neurological: Negative.   Endo/Heme/Allergies: Negative.   Psychiatric/Behavioral: Negative.     Social History   reports that he has quit smoking. His smoking use included cigarettes. He has a 10.00 pack-year smoking history. He does not have any smokeless tobacco history on file. He reports that he does not drink alcohol or use drugs.   Family History   Negative for vascular disease  Allergies Allergies  Allergen Reactions  . Lyrica [Pregabalin] Nausea Only and Other (See Comments)    Hallucinations and dizziness, also  . Sulfa Antibiotics Other (See Comments)    Reaction not recalled      Home Medications  Prior to Admission medications   Medication Sig Start Date End Date Taking? Authorizing Provider  acetaminophen (TYLENOL) 500 MG tablet Take 1,000 mg by mouth every 6 (six) hours as needed for mild pain or headache.    Yes [provider]  amLODipine (NORVASC) 10 MG tablet Take 10 mg by mouth daily.  07/31/19  Yes [provider]  Ascorbic Acid (VITAMIN C) 500 MG CAPS Take 500-1,000  mg by mouth daily.   Yes [provider]  aspirin EC 81 MG tablet Take 81 mg by mouth daily.   Yes [provider]  benazepril-hydrochlorthiazide (LOTENSIN HCT) 20-25 MG per tablet Take 1 tablet by mouth at bedtime.    Yes [provider]  CALCIUM PO Take 1 tablet by mouth daily.   Yes [provider]  Cyanocobalamin (VITAMIN B-12 PO) Take 1 tablet by mouth daily.   Yes [provider]  HYDROcodone-acetaminophen (NORCO/VICODIN) 5-325 MG tablet Take 1-2 tablets by mouth  See admin instructions. Take one to two tablets by mouth every 8-12 hours as needed for pain 09/28/19  Yes [provider]  meloxicam (MOBIC) 15 MG tablet Take 15 mg by mouth daily.   Yes [provider]  metFORMIN (GLUCOPHAGE) 500 MG tablet Take 500 mg by mouth 2 (two) times daily. 08/01/19  Yes [provider]  Multiple Vitamins-Minerals (MULTIVITAL PO) Take 1 tablet by mouth daily.   Yes [provider]  rosuvastatin (CRESTOR) 10 MG tablet Take 10 mg by mouth daily.   Yes [provider]     Interim history/subjective:  Continues to have severe abdominal pain.  Objective   Blood pressure (!) 153/102, pulse 71, temperature 99.1 F (37.3 C), temperature source Oral, resp. rate (!) 27, height 5\' 11"  (1.803 m), weight 89.4 kg, SpO2 97 %.       No intake or output data in the 24 hours ending 10/08/19 2057 Filed Weights   10/08/19 1845  Weight: 89.4 kg    Examination: Physical Exam Constitutional:      Appearance: He is well-developed.  HENT:     Head: Normocephalic and atraumatic.  Eyes:     Extraocular Movements: Extraocular movements intact.     Pupils: Pupils are equal, round, and reactive to light.  Neck:     Thyroid: No thyromegaly.     Vascular: No JVD.     Trachea: No tracheal deviation.  Cardiovascular:     Rate and Rhythm: Normal rate and regular rhythm.     Pulses:          Carotid pulses are 2+ on the right side and 2+ on the left side.      Radial pulses are 2+ on the right side and 2+ on the left side.       Dorsalis pedis pulses are 2+ on the right side and 2+ on the left side.       Posterior tibial pulses are 2+ on the right side and 2+ on the left side.     Heart sounds: Normal heart sounds.  Pulmonary:     Effort: Pulmonary effort is normal. No tachypnea.     Breath sounds: Normal breath sounds.  Abdominal:     Palpations: Abdomen is soft.     Tenderness: There is abdominal tenderness (in epigastric region).   Musculoskeletal:     Cervical back: Neck supple.     Right lower leg: No edema.     Left lower leg: No edema.     Comments: Negative straight leg raising.  Skin:    General: Skin is dry.     Capillary Refill: Capillary refill takes less than 2 seconds.  Neurological:     General: No focal deficit present.     Mental Status: He is alert.  Psychiatric:        Mood and Affect: Mood normal.      Ancillary tests (personally reviewed)  CBC:  Recent Labs  Lab 10/08/19 1510  WBC 14.1*  HGB 14.5  HCT 43.0  MCV 88.1  PLT 0000000    Basic Metabolic Panel: Recent Labs  Lab 10/08/19 1510  NA 139  K 3.3*  CL 102  CO2 23  GLUCOSE 170*  BUN 12  CREATININE 1.25*  CALCIUM 9.7   GFR: Estimated Creatinine Clearance: 55.2 mL/min (A) (by C-G formula based on SCr of 1.25 mg/dL (H)). Recent Labs  Lab 10/08/19 1510  WBC 14.1*    Liver Function Tests: No results for input(s): AST, ALT, ALKPHOS, BILITOT, PROT, ALBUMIN in the last 168 hours. No results for input(s): LIPASE, AMYLASE in the last 168 hours. No results for input(s): AMMONIA in the last 168 hours.  ABG No results found for: PHART, PCO2ART, PO2ART, HCO3, TCO2, ACIDBASEDEF, O2SAT   Coagulation Profile: No results for input(s): INR, PROTIME in the last 168 hours.  Cardiac Enzymes: No results for input(s): CKTOTAL, CKMB, CKMBINDEX, TROPONINI in the last 168 hours.  HbA1C: No results found for: HGBA1C  CBG: No results for input(s): GLUCAP in the last 168 hours.   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  Background history of hypertension.  - will require labetalol infusion to control SBP to <120. - continue and optimize home oral medications. - Vascular Surgery has evaluated and recommends medical management.   Incidental finding of small pulmonary embolism of CT angio, but study was optimized for arterial phase and would be unusual for  both conditions to coexist.  - Repeat formal pulmonary arterial phase PE study.  - If remains positive, Dr Donzetta Matters does not see absolute contraindication to cautious anticoagulation.  Type 2 diabetes on oral agents.  Dyslipidemia on statin  Daily Goals Checklist  Pain/Anxiety/Delirium protocol (if indicated): fentanyl prn for pain.  VAP protocol (if indicated): not intubated Respiratory support goals: O2 via  to keep sat >0% Blood pressure target: labetalol infusion to keep SBP<120 DVT prophylaxis: Lovenox or iv heparin pending CT PE  Nutritional status and feeding goals: Clear liquids overnight, can progress to Carb Control diet in am. GI prophylaxis: no indicated. Fluid status goals: IV LR at 75 for 24h to avoid contrast nephropathy. Urinary catheter: Assessment of intravascular volume Central lines: PIV only Glucose control: phase 1, hold metformin given contrast load. Mobility/therapy needs: bed rest overnight, then progressive ambulation. Antibiotic de-escalation: no antibiotics Home medication reconciliation: continue home anti-HTN and statin Daily labs: CBC, BMP Code Status: Full cod Family Communication: have updated patient directly Disposition: ICU  CRITICAL CARE Performed by: Kipp Brood   Total critical care time: 40 minutes  Critical care time was exclusive of separately billable procedures and treating other patients.  Critical care was necessary to treat or prevent imminent or life-threatening deterioration.  Critical care was time spent personally by me on the following activities: development of treatment plan with patient and/or surrogate as well as nursing, discussions with consultants, evaluation of patient's response to treatment, examination of patient, obtaining history from patient or surrogate, ordering and performing treatments and interventions, ordering and review of laboratory studies, ordering and review of radiographic studies, pulse oximetry,  re-evaluation of patient's condition and participation in multidisciplinary rounds.  Kipp Brood, MD [facility] ICU Physician [place]  Pager: [phone] Mobile: [phone] After hours: [phone].    .10/08/2019, 8:57 PM

## 2019-10-09 ENCOUNTER — Inpatient Hospital Stay (HOSPITAL_COMMUNITY): Payer: Medicare Other

## 2019-10-09 ENCOUNTER — Encounter (HOSPITAL_COMMUNITY): Payer: Self-pay | Admitting: Pulmonary Disease

## 2019-10-09 DIAGNOSIS — I719 Aortic aneurysm of unspecified site, without rupture: Secondary | ICD-10-CM

## 2019-10-09 DIAGNOSIS — I7100 Dissection of unspecified site of aorta: Secondary | ICD-10-CM

## 2019-10-09 DIAGNOSIS — I2699 Other pulmonary embolism without acute cor pulmonale: Secondary | ICD-10-CM

## 2019-10-09 DIAGNOSIS — I351 Nonrheumatic aortic (valve) insufficiency: Secondary | ICD-10-CM

## 2019-10-09 DIAGNOSIS — I350 Nonrheumatic aortic (valve) stenosis: Secondary | ICD-10-CM

## 2019-10-09 LAB — HEPARIN LEVEL (UNFRACTIONATED)
Heparin Unfractionated: <0.1 IU/mL — ABNORMAL LOW (ref 0.30–0.70)
Heparin Unfractionated: 0.14 IU/mL — ABNORMAL LOW (ref 0.30–0.70)
Heparin Unfractionated: 0.22 IU/mL — ABNORMAL LOW (ref 0.30–0.70)

## 2019-10-09 LAB — CBG MONITORING, ED: Glucose-Capillary: 163 mg/dL — ABNORMAL HIGH (ref 70–99)

## 2019-10-09 LAB — CBC
HCT: 40.5 % (ref 39.0–52.0)
Hemoglobin: 13.4 g/dL (ref 13.0–17.0)
MCH: 29.6 pg (ref 26.0–34.0)
MCHC: 33.1 g/dL (ref 30.0–36.0)
MCV: 89.6 fL (ref 80.0–100.0)
Platelets: 146 10*3/uL — ABNORMAL LOW (ref 150–400)
RBC: 4.52 MIL/uL (ref 4.22–5.81)
RDW: 14 % (ref 11.5–15.5)
WBC: 12.7 10*3/uL — ABNORMAL HIGH (ref 4.0–10.5)
nRBC: 0 % (ref 0.0–0.2)

## 2019-10-09 LAB — LIPID PANEL
Cholesterol: 136 mg/dL (ref 0–200)
HDL: 40 mg/dL — ABNORMAL LOW (ref >40)
LDL Cholesterol: 72 mg/dL (ref 0–99)
Total CHOL/HDL Ratio: 3.4 RATIO
Triglycerides: 121 mg/dL (ref <150)
VLDL: 24 mg/dL (ref 0–40)

## 2019-10-09 LAB — GLUCOSE, CAPILLARY
Glucose-Capillary: 157 mg/dL — ABNORMAL HIGH (ref 70–99)
Glucose-Capillary: 126 mg/dL — ABNORMAL HIGH (ref 70–99)
Glucose-Capillary: 118 mg/dL — ABNORMAL HIGH (ref 70–99)
Glucose-Capillary: 138 mg/dL — ABNORMAL HIGH (ref 70–99)

## 2019-10-09 LAB — ECHOCARDIOGRAM COMPLETE
Height: 71 in
Weight: 3065.28 oz

## 2019-10-09 LAB — BASIC METABOLIC PANEL
Anion gap: 14 (ref 5–15)
BUN: 16 mg/dL (ref 8–23)
CO2: 22 mmol/L (ref 22–32)
Calcium: 9.2 mg/dL (ref 8.9–10.3)
Chloride: 101 mmol/L (ref 98–111)
Creatinine, Ser: 1.92 mg/dL — ABNORMAL HIGH (ref 0.61–1.24)
GFR calc Af Amer: 39 mL/min — ABNORMAL LOW (ref >60)
GFR calc non Af Amer: 34 mL/min — ABNORMAL LOW (ref >60)
Glucose, Bld: 159 mg/dL — ABNORMAL HIGH (ref 70–99)
Potassium: 3.4 mmol/L — ABNORMAL LOW (ref 3.5–5.1)
Sodium: 137 mmol/L (ref 135–145)

## 2019-10-09 LAB — TSH: TSH: 0.739 u[IU]/mL (ref 0.350–4.500)

## 2019-10-09 LAB — HEMOGLOBIN A1C
Hgb A1c MFr Bld: 6.7 % — ABNORMAL HIGH (ref 4.8–5.6)
Mean Plasma Glucose: 145.59 mg/dL

## 2019-10-09 LAB — MRSA PCR SCREENING: MRSA by PCR: NEGATIVE

## 2019-10-09 MED ORDER — INSULIN ASPART 100 UNIT/ML ~~LOC~~ SOLN
0.0000 [IU] | Freq: Three times a day (TID) | SUBCUTANEOUS | Status: DC
  Administered 2019-10-09 – 2019-10-17 (×14): 2 [IU] via SUBCUTANEOUS
  Administered 2019-10-17: 3 [IU] via SUBCUTANEOUS
  Administered 2019-10-18 – 2019-10-20 (×3): 2 [IU] via SUBCUTANEOUS
  Administered 2019-10-20: 3 [IU] via SUBCUTANEOUS
  Administered 2019-10-21 – 2019-10-22 (×4): 2 [IU] via SUBCUTANEOUS
  Administered 2019-10-23: 3 [IU] via SUBCUTANEOUS
  Administered 2019-10-23: 2 [IU] via SUBCUTANEOUS
  Administered 2019-10-24: 3 [IU] via SUBCUTANEOUS

## 2019-10-09 MED ORDER — INFLUENZA VAC A&B SA ADJ QUAD 0.5 ML IM PRSY
0.5000 mL | PREFILLED_SYRINGE | INTRAMUSCULAR | Status: DC
  Filled 2019-10-09: qty 0.5

## 2019-10-09 MED ORDER — AMLODIPINE BESYLATE 10 MG PO TABS
10.0000 mg | ORAL_TABLET | Freq: Every day | ORAL | Status: DC
Start: 2019-10-09 — End: 2019-10-24
  Administered 2019-10-10 – 2019-10-24 (×14): 10 mg via ORAL
  Filled 2019-10-09 (×7): qty 1
  Filled 2019-10-09: qty 2
  Filled 2019-10-09 (×7): qty 1

## 2019-10-09 MED ORDER — INSULIN ASPART 100 UNIT/ML ~~LOC~~ SOLN: 0.0000 [IU] | Freq: Every day | SUBCUTANEOUS | Status: DC

## 2019-10-09 MED ORDER — RINGERS IV SOLN: INTRAVENOUS | Status: DC

## 2019-10-09 MED ORDER — PNEUMOCOCCAL VAC POLYVALENT 25 MCG/0.5ML IJ INJ: 0.5000 mL | INJECTION | INTRAMUSCULAR | Status: DC

## 2019-10-09 MED ORDER — CHLORHEXIDINE GLUCONATE CLOTH 2 % EX PADS
6.0000 | MEDICATED_PAD | Freq: Every day | CUTANEOUS | Status: DC
  Administered 2019-10-09 – 2019-10-24 (×14): 6 via TOPICAL

## 2019-10-09 MED ORDER — LABETALOL HCL 5 MG/ML IV SOLN
5.0000 mg | INTRAVENOUS | Status: DC | PRN
Start: 2019-10-09 — End: 2019-10-15
  Administered 2019-10-09 (×2): 10 mg via INTRAVENOUS
  Administered 2019-10-12 (×2): 20 mg via INTRAVENOUS
  Administered 2019-10-13 (×3): 10 mg via INTRAVENOUS
  Administered 2019-10-13: 5 mg via INTRAVENOUS
  Filled 2019-10-09 (×6): qty 4

## 2019-10-09 MED ORDER — POTASSIUM CHLORIDE CRYS ER 20 MEQ PO TBCR
40.0000 meq | EXTENDED_RELEASE_TABLET | Freq: Once | ORAL | Status: AC
  Administered 2019-10-09: 40 meq via ORAL
  Filled 2019-10-09: qty 2

## 2019-10-09 MED ORDER — LABETALOL HCL 5 MG/ML IV SOLN: 10.0000 mg | INTRAVENOUS | Status: DC | PRN

## 2019-10-09 MED ORDER — LACTATED RINGERS IV SOLN: INTRAVENOUS | Status: AC

## 2019-10-09 NOTE — Progress Notes (Signed)
Subjective:  Still complains of mild abdominal discomfort.  No chest pain.  Intake/Output from previous day:  I/O last 3 completed shifts: In: 392.3 [I.V.:392.3] Out: 425 [Urine:425] Total I/O In: 484.1 [P.O.:120; I.V.:364.1] Out: -   Blood pressure 109/70, pulse (!) 56, temperature 99.5 F (37.5 C), temperature source Oral, resp. rate 15, height 5' 11"  (1.803 m), weight 86.9 kg, SpO2 98 %.  Vitals with BMI 10/09/2019 10/09/2019 10/09/2019  Height - - -  Weight - - -  BMI - - -  Systolic 817 711 657  Diastolic 70 61 68  Pulse 56 62 60    Physical Exam  Constitutional: He appears well-developed.  Cardiovascular: Normal rate, regular rhythm, normal heart sounds, intact distal pulses and normal pulses. Exam reveals no gallop.  No murmur heard. No leg edema, no JVD.  Pulmonary/Chest: Effort normal and breath sounds normal.  Abdominal: Soft. Bowel sounds are normal. There is abdominal tenderness (mild mid abdominal tenderness noted).  Skin: Skin is warm and dry.   Lab Results: BMP BNP (last 3 results) No results for input(s): BNP in the last 8760 hours.  ProBNP (last 3 results) No results for input(s): PROBNP in the last 8760 hours. BMP Latest Ref Rng & Units 10/09/2019 10/08/2019 12/10/2014  Glucose 70 - 99 mg/dL 159(H) 170(H) 110(H)  BUN 8 - 23 mg/dL 16 12 10   Creatinine 0.61 - 1.24 mg/dL 1.92(H) 1.25(H) 1.42(H)  Sodium 135 - 145 mmol/L 137 139 142  Potassium 3.5 - 5.1 mmol/L 3.4(L) 3.3(L) 3.3(L)  Chloride 98 - 111 mmol/L 101 102 103  CO2 22 - 32 mmol/L 22 23 28   Calcium 8.9 - 10.3 mg/dL 9.2 9.7 9.5   Hepatic Function Latest Ref Rng & Units 12/10/2014 06/30/2013  Total Protein 6.0 - 8.3 g/dL 7.3 7.3  Albumin 3.5 - 5.2 g/dL 4.0 4.0  AST 0 - 37 U/L 34 28  ALT 0 - 53 U/L 69(H) 29  Alk Phosphatase 39 - 117 U/L 61 73  Total Bilirubin 0.3 - 1.2 mg/dL 1.0 0.7   CBC Latest Ref Rng & Units 10/09/2019 10/08/2019 12/10/2014  WBC 4.0 - 10.5 K/uL 12.7(H) 14.1(H) 7.0  Hemoglobin 13.0 -  17.0 g/dL 13.4 14.5 13.8  Hematocrit 39.0 - 52.0 % 40.5 43.0 40.6  Platelets 150 - 400 K/uL 146(L) 175 222   Lipid Panel  No results found for: CHOL, TRIG, HDL, CHOLHDL, VLDL, LDLCALC, LDLDIRECT Cardiac Panel (last 3 results) No results for input(s): CKTOTAL, CKMB, TROPONINI, RELINDX in the last 72 hours.  HEMOGLOBIN A1C Lab Results  Component Value Date   HGBA1C 6.7 (H) 10/09/2019   MPG 145.59 10/09/2019   TSH No results for input(s): TSH in the last 8760 hours.  Imaging: CT of the abdomen and pelvis 10/08/2019: 1. 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 2. 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 There is a small amount of periaortic hematoma along the distal descending thoracic aorta. Vascular surgery consult is advised. 3. Small pulmonary embolus involving the central right upper lobe pulmonary artery as well as probable additional small subsegmental embolus in the right lower lobe. 4. Ovoid soft tissue density posterior to the left common iliac vein, indeterminate, possibly an enlarged lymph node or a neurogenic tumor. This can be better evaluated with MRI on a nonemergent basis. 5. Additional findings as above. These results were called by telephone at the time  of interpretation on 10/08/2019 at 7:02 pm to provider MATTHEW TRIFAN , who verbally acknowledged these results. Electronically Signed   By: Anner Crete M.D.   On: 10/08/2019 19:29   DG Chest Portable 1 View Result Date: 10/08/2019 CLINICAL DATA:  Chest pain. EXAM: PORTABLE CHEST 1 VIEW COMPARISON:  December 10, 2014 FINDINGS: Cardiomegaly. Probable atelectasis in the left base. The heart, hila, mediastinum, lungs, and pleura are otherwise unchanged since December 10, 2014. IMPRESSION: No active disease. Electronically Signed   By: Dorise Bullion III M.D   On: 10/08/2019 15:44   LOWER  EXTREMITY ARTERIAL DUPLEX STUDY 10/09/2019: Right: Imaged areas are triphasic and no popliteal aneurysm visualized.  I left: Imaged areas are triphasic and no popliteal aneurysm visualized.   Cardiac Studies:  EKG 10/08/2019: Normal sinus rhythm at rate of 94 bpm, normal axis, nonspecific sagging ST depression in the inferior leads and lateral leads.  Normal QT interval.  Echocardiogram 10/09/2019:  1. Left ventricular ejection fraction, by estimation, is 60 to 65%. The left ventricle has normal function. The left ventricle has no regional wall motion abnormalities. Left ventricular diastolic parameters are consistent with Grade I diastolic dysfunction (impaired relaxation).   2. Right ventricular systolic function is normal. The right ventricular size is normal. Tricuspid regurgitation signal is inadequate for assessing PA pressure.   3. The aortic valve is tricuspid. Aortic valve regurgitation is mild. No aortic stenosis is present. Dissection flap present in descending aorta. Known type B dissection per medical record. 4. Ascending aortic aneurysm measured 46 mm on CTA.  Aneurysm of the ascending aorta, measuring 45 mm.   Scheduled Meds: . amLODipine  10 mg Oral Daily  . Chlorhexidine Gluconate Cloth  6 each Topical Daily  . [START ON 10/10/2019] influenza vaccine adjuvanted  0.5 mL Intramuscular Tomorrow-1000  . insulin aspart  0-15 Units Subcutaneous TID WC  . insulin aspart  0-5 Units Subcutaneous QHS  . [START ON 10/10/2019] pneumococcal 23 valent vaccine  0.5 mL Intramuscular Tomorrow-1000  . sodium chloride flush  3 mL Intravenous Once  . sodium chloride flush  3 mL Intravenous Q12H   Continuous Infusions: . heparin 1,500 Units/hr (10/09/19 1103)  . labetalol (NORMODYNE) infusion Stopped (10/09/19 0724)  . lactated ringers 75 mL/hr at 10/09/19 1029   PRN Meds:.acetaminophen, fentaNYL (SUBLIMAZE) injection, ondansetron (ZOFRAN) IV  Assessment/Plan:  1.  Abdominal aortic  dissection secondary to hypertensive urgency 2.  Hypotension 3.  Acute renal failure, stage III.  Secondary to hypotension and contrast exposure.  Recommendation: Patient's blood pressure is now trending upwards, continue to hold amlodipine and also discontinue bowel drip.  Continue IV hydration for now.  He still has abdominal discomfort, but has bounding pulses in the lower extremity.  Continue observation in the ICU.  Will avoid nephrotoxic agents for now.  Met his wife at the bedside and updated them.  I will check lipid panel and TSH for the morning.   [name] [name], M.D. 10/09/2019, 5:37 PM [facility]

## 2019-10-09 NOTE — Progress Notes (Signed)
[place] for heparin IV Indication: PE  Allergies  Allergen Reactions  . Lyrica [Pregabalin] Nausea Only and Other (See Comments)    Hallucinations and dizziness, also  . Sulfa Antibiotics Other (See Comments)    Reaction not recalled     Patient Measurements: Height: 5\' 11"  (180.3 cm) Weight: 191 lb 9.3 oz (86.9 kg) IBW/kg (Calculated) : 75.3 Heparin Dosing Weight: 89.4 kg  Vital Signs: Temp: 98.6 F (37 C) (02/19 0745) Temp Source: Oral (02/19 0745) BP: 104/72 (02/19 1000) Pulse Rate: 56 (02/19 1000)  Labs: Recent Labs    10/08/19 1510 10/08/19 1750 10/08/19 2130 10/09/19 0242 10/09/19 0922  HGB 14.5  --   --  13.4  --   HCT 43.0  --   --  40.5  --   PLT 175  --   --  146*  --   APTT  --   --  30  --   --   LABPROT  --   --  14.4  --   --   INR  --   --  1.1  --   --   HEPARINUNFRC  --   --   --  <0.10* 0.14*  CREATININE 1.25*  --   --  1.92*  --   TROPONINIHS 28* 34*  --   --   --     Estimated Creatinine Clearance: 36 mL/min (A) (by C-G formula based on SCr of 1.92 mg/dL (H)).   Medical History: Past Medical History:  Diagnosis Date  . Chronic back pain    stenosis  . Enlarged prostate   . GERD (gastroesophageal reflux disease)    occasionally will take a zantac(maybe once a month)  . History of colon polyps   . History of kidney stones   . History of stress test    done 10 yrs. ago, as a baseline   . Hyperlipidemia    takes Crestor daily  . Hypertension    takes Amlodipine daily and Lotensin as well    Medications:  Scheduled:  . amLODipine  10 mg Oral Daily  . Chlorhexidine Gluconate Cloth  6 each Topical Daily  . [START ON 10/10/2019] influenza vaccine adjuvanted  0.5 mL Intramuscular Tomorrow-1000  . insulin aspart  0-15 Units Subcutaneous TID WC  . insulin aspart  0-5 Units Subcutaneous QHS  . [START ON 10/10/2019] pneumococcal 23 valent vaccine  0.5 mL Intramuscular Tomorrow-1000  . sodium chloride  flush  3 mL Intravenous Once  . sodium chloride flush  3 mL Intravenous Q12H   Infusions:  . heparin 1,250 Units/hr (10/09/19 0800)  . labetalol (NORMODYNE) infusion Stopped (10/09/19 0724)  . lactated ringers 75 mL/hr at 10/09/19 1029    Assessment: 74 y/o male presenting with lower chest and epigastric pain, found to have descending aortic dissection with small periaortic hematoma and pulmonary embolism. Patient is not taking anticoagulation PTA. Pharmacy has been consulted to dose heparin IV infusion for PE.  -heparin level= 0.14 on 1250 units/hr   Goal of Therapy:  Heparin level 0.3-0.5 units/ml Monitor platelets by anticoagulation protocol: Yes   Plan:  No heparin bolus Increase heparin infusion to 1500 units/hr Heparin level in 8 hours and daily wth CBC daily  Hildred Laser, PharmD Clinical Pharmacist **Pharmacist phone directory can now be found on [email or web address] ([email or web address]).  Listed under [place].

## 2019-10-09 NOTE — Progress Notes (Signed)
eLink Physician-Brief Progress Note Patient Name: [name] [name] DOB: 1945-11-02 MRN: [id number]   Date of Service  10/09/2019  HPI/Events of Note  Pt admitted through the ED with an aortic dissection related to poorly compliant hypertension.  eICU Interventions  New Patient Evaluation completed.        [name] [name] 10/09/2019, 1:20 AM

## 2019-10-09 NOTE — Progress Notes (Signed)
[place] Progress Note Patient Name: [name] [name] DOB: [birth year] MRN: [id number]   Date of Service  10/09/2019  HPI/Events of Note  Pt with aortic dissection, Labetalol infusion was stopped earlier today, SBP now 140-150's  eICU Interventions  Labetalol 5-20 mg iv Q 2  Hours PRN SBP > 120 mmHg ordered, Labetalol infusion was left on the [facility] as second line Rx if PRN labetalol fails to control BP.        Kerry Kass [name] 10/09/2019, 10:45 PM

## 2019-10-09 NOTE — Progress Notes (Addendum)
  Progress Note    10/09/2019 10:33 AM  Subjective:  Chest/back pain improved.  No abd pain with breakfast.   Vitals:   10/09/19 0930 10/09/19 1000  BP: 96/70 104/72  Pulse: (!) 59 (!) 56  Resp: (!) 23 (!) 27  Temp:    SpO2: 97% 98%   Physical Exam: Lungs:  Non labored on RA Extremities:  Palpable and symmetrical DP pulses Abdomen:  Soft, NT Neurologic: A&O  CBC    Component Value Date/Time   WBC 12.7 (H) 10/09/2019 0242   RBC 4.52 10/09/2019 0242   HGB 13.4 10/09/2019 0242   HCT 40.5 10/09/2019 0242   PLT 146 (L) 10/09/2019 0242   MCV 89.6 10/09/2019 0242   MCH 29.6 10/09/2019 0242   MCHC 33.1 10/09/2019 0242   RDW 14.0 10/09/2019 0242   LYMPHSABS 1.7 12/10/2014 1106   MONOABS 0.6 12/10/2014 1106   EOSABS 0.2 12/10/2014 1106   BASOSABS 0.0 12/10/2014 1106    BMET    Component Value Date/Time   NA 137 10/09/2019 0242   K 3.4 (L) 10/09/2019 0242   CL 101 10/09/2019 0242   CO2 22 10/09/2019 0242   GLUCOSE 159 (H) 10/09/2019 0242   BUN 16 10/09/2019 0242   CREATININE 1.92 (H) 10/09/2019 0242   CALCIUM 9.2 10/09/2019 0242   GFRNONAA 34 (L) 10/09/2019 0242   GFRAA 39 (L) 10/09/2019 0242    INR    Component Value Date/Time   INR 1.1 10/08/2019 2130     Intake/Output Summary (Last 24 hours) at 10/09/2019 1033 Last data filed at 10/09/2019 1000 Gross per 24 hour  Intake 555.45 ml  Output 425 ml  Net 130.45 ml     Assessment/Plan:  74 y.o. male with type B Ao dissection  BLE well perfused with symmetrical DP pulses Watch renal function over the weekend; repeat CTA c/a/p Monday Spot check popliteals today with duplex due to prominent pulses Goal sytolic BP 0000000   Dagoberto Ligas, PA-C [facility] [phone] 10/09/2019 10:33 AM  I have independently interviewed and examined patient and agree with PA assessment and plan above.  Pain has improved with blood pressure control.  Unfortunately creatinine now 1.9 with GFR at 39.   Will need repeat CT scanning prior to discharge but will hydrate first over the weekend.  Will check popliteal artery duplexes.  I have notified Dr. Lynann Bologna of patient's admission.  Kaylyn Garrow C. Donzetta Matters, MD [facility] [place] Office: [phone] Pager: [phone]

## 2019-10-09 NOTE — Progress Notes (Signed)
NAME:  [name] [name], MRN:  [id number], DOB:  1946/04/29, LOS: 1 ADMISSION DATE:  10/08/2019, CONSULTATION DATE:  10/08/2019 REFERRING MD:  [place], CHIEF COMPLAINT:  Aortic dissection with hypertensive emergency.   HPI/course in hospital  74 year old man with 10/10 lower chest and epigastric pain. Has been on/off for several weeks, with worst episode today. States he has felt as though his legs have been about to give out on him. Has been complaining of back pain as well and is scheduled for spinal stenosis surgery but now states that now both th back and abdominal pains are simultaneous.  In ED started on esmolol infusion. Seen by Vascular Sx who recommend medical management.  Past Medical History   has a past medical history of Chronic back pain, Enlarged prostate, GERD (gastroesophageal reflux disease), History of colon polyps, History of kidney stones, History of stress test, Hyperlipidemia, and Hypertension.   has a past surgical history that includes Tonsillectomy; fatty tissue removed from stomach; Colonoscopy; Esophagogastroduodenoscopy; right ankle surgery; Lumbar laminectomy/decompression microdiscectomy (N/A, 07/08/2013); and Anterior lat lumbar fusion (Left, 12/15/2014).    Interim history/subjective:  Pain improved  Objective   Blood pressure 106/68, pulse (!) 55, temperature 98.6 F (37 C), temperature source Oral, resp. rate 19, height 5\' 11"  (1.803 m), weight 86.9 kg, SpO2 96 %.        Intake/Output Summary (Last 24 hours) at 10/09/2019 0756 Last data filed at 10/09/2019 0700 Gross per 24 hour  Intake 392.3 ml  Output 425 ml  Net -32.7 ml   Filed Weights   10/08/19 1845 10/09/19 0030  Weight: 89.4 kg 86.9 kg    Examination:  General:  Elderly appearing male resting comfortably in bed.  Neuro:  Sleeping, but easily arouses. Is alert and oriented. Non-focal.  HEENT:  [place]/AT, No JVD noted, PERRL Cardiovascular:  RRR, no MRG Lungs:  Clear bilateral breath  sounds Abdomen:  Soft, non-distended, non-tender Musculoskeletal:  No acute deformity or ROM limitation. Extremities warm. No edema.  Skin:  Intact, MMM   Ancillary tests (personally reviewed)  CBC: Recent Labs  Lab 10/08/19 1510 10/09/19 0242  WBC 14.1* 12.7*  HGB 14.5 13.4  HCT 43.0 40.5  MCV 88.1 89.6  PLT 175 146*    Basic Metabolic Panel: Recent Labs  Lab 10/08/19 1510 10/09/19 0242  NA 139 137  K 3.3* 3.4*  CL 102 101  CO2 23 22  GLUCOSE 170* 159*  BUN 12 16  CREATININE 1.25* 1.92*  CALCIUM 9.7 9.2   GFR: Estimated Creatinine Clearance: 36 mL/min (A) (by C-G formula based on SCr of 1.92 mg/dL (H)). Recent Labs  Lab 10/08/19 1510 10/09/19 0242  WBC 14.1* 12.7*    Liver Function Tests: No results for input(s): AST, ALT, ALKPHOS, BILITOT, PROT, ALBUMIN in the last 168 hours. No results for input(s): LIPASE, AMYLASE in the last 168 hours. No results for input(s): AMMONIA in the last 168 hours.  ABG No results found for: PHART, PCO2ART, PO2ART, HCO3, TCO2, ACIDBASEDEF, O2SAT   Coagulation Profile: Recent Labs  Lab 10/08/19 2130  INR 1.1    Cardiac Enzymes: No results for input(s): CKTOTAL, CKMB, CKMBINDEX, TROPONINI in the last 168 hours.  HbA1C: No results found for: HGBA1C  CBG: Recent Labs  Lab 10/08/19 2214 10/08/19 2359 10/09/19 0744  GLUCAP 165* 163* 157*     Assessment & Plan:   Type B distal thoracic aortic aneurysm: Background history of hypertension.  - SBP goal < 173mmHg -  Labetalol infusion turned off this morning. - restart home amlodipine (on hold now due to SBP 90s, BB drip just stopped) - Holding home benazepril-HCTZ with consideration to AKI  - PRN fentanyl for pain management. - Vascular Surgery has evaluated and recommends medical management.   Pulmonary embolism of CT angio: small incidental finding. Study was optimized for arterial phase and would be unusual for both conditions to coexist.  - Repeat formal  pulmonary arterial phase PE study once renal function recovers from initial contrast load.  - Dopplers pending.  - Continue cautious anticoagulation for now as d/w vascular surgery.   Type 2 diabetes on oral agents.  - hold home metformin - CBG monitoring and SSI  Dyslipidemia on statin - Continue statin  AKI: in the setting of contrast nephropathy. Creatinine 1.2 on admission.  - Gentle IVF  Daily Goals Checklist  Pain/Anxiety/Delirium protocol (if indicated): fentanyl prn for pain.  VAP protocol (if indicated): not intubated Blood pressure target: keep SBP<120 DVT prophylaxis: on IV heparin GI prophylaxis: no indicated. Fluid status goals: IV LR at 75 for 24h to avoid contrast nephropathy. Code Status: Full code Family Communication: have updated patient directly Disposition: ICU   Georgann Housekeeper, AGACNP-BC [place]  See Amion for personal pager PCCM on call pager [phone]  10/09/2019 8:15 AM

## 2019-10-09 NOTE — Progress Notes (Signed)
PRN labetalol not effective in treating blood pressure. Labetalol gtt restarted per emar. Medication explained to patient. Patient expressed understanding.

## 2019-10-09 NOTE — Progress Notes (Signed)
Echocardiogram 2D Echocardiogram has been performed.  Oneal Deputy Delania Ferg 10/09/2019, 12:04 PM

## 2019-10-09 NOTE — Progress Notes (Signed)
[place] for heparin IV Indication: PE  Assessment: 74 y/o male presenting with lower chest and epigastric pain, found to have descending aortic dissection with small periaortic hematoma and pulmonary embolism. Patient is not taking anticoagulation PTA. Pharmacy has been consulted to dose heparin IV infusion for PE.   Will aim for lower heparin level goal and avoid bolus at this time with high risk of bleeding. Heparin level <0.1 units/ml.  No issues with infusion  Goal of Therapy:  Heparin level 0.3-0.5 units/ml Monitor platelets by anticoagulation protocol: Yes   Plan:  Increase heparin drip to 1250 units/hr Check heparin level 6 hours after rate increase  Monitor for s/sx of bleeding  Thanks for allowing pharmacy to be a part of this patient's care.  Excell Seltzer, PharmD Clinical Pharmacist

## 2019-10-09 NOTE — Progress Notes (Signed)
Call to [facility] for BP treatment. Orders pending.

## 2019-10-09 NOTE — Progress Notes (Signed)
Bilateral lower extremity venous duplex exam completed.  Bilateral popliteal artery duplex exam completed.  Preliminary results can be found under CV proc under chart review.  10/09/2019 2:39 PM  Audra Bellard, K., RDMS, RVT

## 2019-10-09 NOTE — Progress Notes (Signed)
[place] for heparin IV Indication: PE  Allergies  Allergen Reactions  . Lyrica [Pregabalin] Nausea Only and Other (See Comments)    Hallucinations and dizziness, also  . Sulfa Antibiotics Other (See Comments)    Reaction not recalled     Patient Measurements: Height: 5\' 11"  (180.3 cm) Weight: 191 lb 9.3 oz (86.9 kg) IBW/kg (Calculated) : 75.3 Heparin Dosing Weight: 89.4 kg  Vital Signs: Temp: 98 F (36.7 C) (02/19 1920) Temp Source: Oral (02/19 1920) BP: 124/75 (02/19 1900) Pulse Rate: 63 (02/19 1900)  Labs: Recent Labs    10/08/19 1510 10/08/19 1750 10/08/19 2130 10/09/19 0242 10/09/19 0922 10/09/19 1859  HGB 14.5  --   --  13.4  --   --   HCT 43.0  --   --  40.5  --   --   PLT 175  --   --  146*  --   --   APTT  --   --  30  --   --   --   LABPROT  --   --  14.4  --   --   --   INR  --   --  1.1  --   --   --   HEPARINUNFRC  --   --   --  <0.10* 0.14* 0.22*  CREATININE 1.25*  --   --  1.92*  --   --   TROPONINIHS 28* 34*  --   --   --   --     Estimated Creatinine Clearance: 36 mL/min (A) (by C-G formula based on SCr of 1.92 mg/dL (H)).  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 Patient is not taking anticoagulation PTA.   Hep lvl this evening low 0.22  Goal of Therapy:  Heparin level 0.3-0.5 units/ml Monitor platelets by anticoagulation protocol: Yes   Plan:  No heparin bolus Increase heparin infusion to 1650 units/hr Next lvl in am  Barth Kirks, PharmD, BCPS, BCCCP Clinical Pharmacist [phone]  Please check AMION for all [place] numbers  10/09/2019 8:03 PM

## 2019-10-10 DIAGNOSIS — I7102 Dissection of abdominal aorta: Secondary | ICD-10-CM

## 2019-10-10 LAB — BASIC METABOLIC PANEL
Anion gap: 12 (ref 5–15)
BUN: 29 mg/dL — ABNORMAL HIGH (ref 8–23)
CO2: 25 mmol/L (ref 22–32)
Calcium: 8.7 mg/dL — ABNORMAL LOW (ref 8.9–10.3)
Chloride: 99 mmol/L (ref 98–111)
Creatinine, Ser: 2.29 mg/dL — ABNORMAL HIGH (ref 0.61–1.24)
GFR calc Af Amer: 31 mL/min — ABNORMAL LOW (ref >60)
GFR calc non Af Amer: 27 mL/min — ABNORMAL LOW (ref >60)
Glucose, Bld: 169 mg/dL — ABNORMAL HIGH (ref 70–99)
Potassium: 3.3 mmol/L — ABNORMAL LOW (ref 3.5–5.1)
Sodium: 136 mmol/L (ref 135–145)

## 2019-10-10 LAB — CBC
HCT: 36.4 % — ABNORMAL LOW (ref 39.0–52.0)
Hemoglobin: 12.1 g/dL — ABNORMAL LOW (ref 13.0–17.0)
MCH: 29.6 pg (ref 26.0–34.0)
MCHC: 33.2 g/dL (ref 30.0–36.0)
MCV: 89 fL (ref 80.0–100.0)
Platelets: 141 10*3/uL — ABNORMAL LOW (ref 150–400)
RBC: 4.09 MIL/uL — ABNORMAL LOW (ref 4.22–5.81)
RDW: 14.2 % (ref 11.5–15.5)
WBC: 9.3 10*3/uL (ref 4.0–10.5)
nRBC: 0 % (ref 0.0–0.2)

## 2019-10-10 LAB — HEPARIN LEVEL (UNFRACTIONATED)
Heparin Unfractionated: 0.33 IU/mL (ref 0.30–0.70)
Heparin Unfractionated: 0.29 IU/mL — ABNORMAL LOW (ref 0.30–0.70)

## 2019-10-10 LAB — GLUCOSE, CAPILLARY
Glucose-Capillary: 145 mg/dL — ABNORMAL HIGH (ref 70–99)
Glucose-Capillary: 146 mg/dL — ABNORMAL HIGH (ref 70–99)
Glucose-Capillary: 143 mg/dL — ABNORMAL HIGH (ref 70–99)
Glucose-Capillary: 149 mg/dL — ABNORMAL HIGH (ref 70–99)

## 2019-10-10 MED ORDER — ATORVASTATIN CALCIUM 10 MG PO TABS
10.0000 mg | ORAL_TABLET | Freq: Every day | ORAL | Status: DC
  Administered 2019-10-10 – 2019-10-23 (×13): 10 mg via ORAL
  Filled 2019-10-10 (×13): qty 1

## 2019-10-10 MED ORDER — POTASSIUM CHLORIDE CRYS ER 20 MEQ PO TBCR
30.0000 meq | EXTENDED_RELEASE_TABLET | Freq: Two times a day (BID) | ORAL | Status: AC
  Administered 2019-10-10 (×2): 30 meq via ORAL
  Filled 2019-10-10 (×2): qty 1

## 2019-10-10 NOTE — Progress Notes (Signed)
Subjective:  No further abdominal discomfort.  No chest pain.  Intake/Output from previous day:  I/O last 3 completed shifts: In: 1146.2 [P.O.:120; I.V.:1026.2] Out: 650 [Urine:650] Total I/O In: 1345.8 [I.V.:1345.8] Out: 555 [Urine:555]  Blood pressure 111/74, pulse (!) 56, temperature 98.9 F (37.2 C), temperature source Oral, resp. rate (!) 22, height 5' 11"  (1.803 m), weight 87 kg, SpO2 97 %.  Vitals with BMI 10/10/2019 10/10/2019 10/10/2019  Height - - -  Weight - - -  BMI - - -  Systolic 833 825 053  Diastolic 74 73 83  Pulse 56 59 58    Physical Exam  Constitutional: He appears well-developed.  Cardiovascular: Normal rate, regular rhythm, normal heart sounds, intact distal pulses and normal pulses. Exam reveals no gallop.  No murmur heard. No leg edema, no JVD.  Pulmonary/Chest: Effort normal and breath sounds normal.  Abdominal: Soft. Bowel sounds are normal. There is no abdominal tenderness.  Skin: Skin is warm and dry.   Lab Results: BMP BNP (last 3 results) No results for input(s): BNP in the last 8760 hours.  ProBNP (last 3 results) No results for input(s): PROBNP in the last 8760 hours. BMP Latest Ref Rng & Units 10/10/2019 10/09/2019 10/08/2019  Glucose 70 - 99 mg/dL 169(H) 159(H) 170(H)  BUN 8 - 23 mg/dL 29(H) 16 12  Creatinine 0.61 - 1.24 mg/dL 2.29(H) 1.92(H) 1.25(H)  Sodium 135 - 145 mmol/L 136 137 139  Potassium 3.5 - 5.1 mmol/L 3.3(L) 3.4(L) 3.3(L)  Chloride 98 - 111 mmol/L 99 101 102  CO2 22 - 32 mmol/L 25 22 23   Calcium 8.9 - 10.3 mg/dL 8.7(L) 9.2 9.7   Hepatic Function Latest Ref Rng & Units 12/10/2014 06/30/2013  Total Protein 6.0 - 8.3 g/dL 7.3 7.3  Albumin 3.5 - 5.2 g/dL 4.0 4.0  AST 0 - 37 U/L 34 28  ALT 0 - 53 U/L 69(H) 29  Alk Phosphatase 39 - 117 U/L 61 73  Total Bilirubin 0.3 - 1.2 mg/dL 1.0 0.7   CBC Latest Ref Rng & Units 10/10/2019 10/09/2019 10/08/2019  WBC 4.0 - 10.5 K/uL 9.3 12.7(H) 14.1(H)  Hemoglobin 13.0 - 17.0 g/dL 12.1(L) 13.4  14.5  Hematocrit 39.0 - 52.0 % 36.4(L) 40.5 43.0  Platelets 150 - 400 K/uL 141(L) 146(L) 175   Lipid Panel     Component Value Date/Time   CHOL 136 10/09/2019 1859   TRIG 121 10/09/2019 1859   HDL 40 (L) 10/09/2019 1859   CHOLHDL 3.4 10/09/2019 1859   VLDL 24 10/09/2019 1859   LDLCALC 72 10/09/2019 1859   Cardiac Panel (last 3 results) No results for input(s): CKTOTAL, CKMB, TROPONINI, RELINDX in the last 72 hours.  HEMOGLOBIN A1C Lab Results  Component Value Date   HGBA1C 6.7 (H) 10/09/2019   MPG 145.59 10/09/2019   TSH Recent Labs    10/09/19 1859  TSH 0.739    Imaging: CT of the abdomen and pelvis 10/08/2019: 1. 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 2. 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 There is a small amount of periaortic hematoma along the distal descending thoracic aorta. Vascular surgery consult is advised. 3. Small pulmonary embolus involving the central right upper lobe pulmonary artery as well as probable additional small subsegmental embolus in the right lower lobe. 4. Ovoid soft tissue density posterior to the left common iliac vein, indeterminate, possibly an enlarged  lymph node or a neurogenic tumor. This can be better evaluated with MRI on a nonemergent basis. 5. Additional findings as above. These results were called by telephone at the time of interpretation on 10/08/2019 at 7:02 pm to provider MATTHEW TRIFAN , who verbally acknowledged these results. Electronically Signed   By: Anner Crete M.D.   On: 10/08/2019 19:29   DG Chest Portable 1 View Result Date: 10/08/2019 CLINICAL DATA:  Chest pain. EXAM: PORTABLE CHEST 1 VIEW COMPARISON:  December 10, 2014 FINDINGS: Cardiomegaly. Probable atelectasis in the left base. The heart, hila, mediastinum, lungs, and pleura are otherwise unchanged since December 10, 2014.  IMPRESSION: No active disease. Electronically Signed   By: Dorise Bullion III M.D   On: 10/08/2019 15:44   LOWER EXTREMITY ARTERIAL DUPLEX STUDY 10/09/2019: Right: Imaged areas are triphasic and no popliteal aneurysm visualized.  I left: Imaged areas are triphasic and no popliteal aneurysm visualized.   Cardiac Studies:  EKG 10/08/2019: Normal sinus rhythm at rate of 94 bpm, normal axis, nonspecific sagging ST depression in the inferior leads and lateral leads.  Normal QT interval.  Echocardiogram 10/09/2019:  1. Left ventricular ejection fraction, by estimation, is 60 to 65%. The left ventricle has normal function. The left ventricle has no regional wall motion abnormalities. Left ventricular diastolic parameters are consistent with Grade I diastolic dysfunction (impaired relaxation).   2. Right ventricular systolic function is normal. The right ventricular size is normal. Tricuspid regurgitation signal is inadequate for assessing PA pressure.   3. The aortic valve is tricuspid. Aortic valve regurgitation is mild. No aortic stenosis is present. Dissection flap present in descending aorta. Known type B dissection per medical record. 4. Ascending aortic aneurysm measured 46 mm on CTA.  Aneurysm of the ascending aorta, measuring 45 mm.   Scheduled Meds: . amLODipine  10 mg Oral Daily  . Chlorhexidine Gluconate Cloth  6 each Topical Daily  . influenza vaccine adjuvanted  0.5 mL Intramuscular Tomorrow-1000  . insulin aspart  0-15 Units Subcutaneous TID WC  . insulin aspart  0-5 Units Subcutaneous QHS  . pneumococcal 23 valent vaccine  0.5 mL Intramuscular Tomorrow-1000  . sodium chloride flush  3 mL Intravenous Once  . sodium chloride flush  3 mL Intravenous Q12H   Continuous Infusions: . heparin 1,650 Units/hr (10/09/19 2014)  . labetalol (NORMODYNE) infusion 3 mg/min (10/10/19 0454)  . lactated ringers 75 mL/hr at 10/09/19 2122   PRN Meds:.acetaminophen, fentaNYL (SUBLIMAZE) injection,  labetalol, ondansetron (ZOFRAN) IV  Assessment/Plan:  1.  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  Hypotension now resolved 3.  Acute renal failure, stage III.  Secondary to hypotension and contrast exposure. 4. ??PE  Recommendation: Patient's blood pressure is stable. Continue to hold amlodipine.   Continue IV hydration for now.   Continue observation in the ICU.  Will avoid nephrotoxic agents for now.  Consider VQ scan prior to treating for PE  I will follow sidelines. Will add low dose statin for vascular protection. TSH normal.   Adrian Prows, M.D. 10/10/2019, 6:46 AM [facility]

## 2019-10-10 NOTE — Plan of Care (Signed)

## 2019-10-10 NOTE — Progress Notes (Signed)
ANTICOAGULATION CONSULT NOTE - Follow Up Consult  Pharmacy Consult for heparin Indication: pulmonary embolus  Labs: Recent Labs    10/08/19 1510 10/08/19 1510 10/08/19 1750 10/08/19 2130 10/09/19 0242 10/09/19 0242 10/09/19 0922 10/09/19 1859 10/10/19 0208  HGB 14.5   < >  --   --  13.4  --   --   --  12.1*  HCT 43.0  --   --   --  40.5  --   --   --  36.4*  PLT 175  --   --   --  146*  --   --   --  141*  APTT  --   --   --  30  --   --   --   --   --   LABPROT  --   --   --  14.4  --   --   --   --   --   INR  --   --   --  1.1  --   --   --   --   --   HEPARINUNFRC  --   --   --   --  <0.10*   < > 0.14* 0.22* 0.33  CREATININE 1.25*  --   --   --  1.92*  --   --   --  2.29*  TROPONINIHS 28*  --  34*  --   --   --   --   --   --    < > = values in this interval not displayed.    Assessment/Plan:  74yo male therapeutic on heparin after rate changes. Will continue gtt at current rate and confirm stable with additional level.    Wynona Neat, PharmD, BCPS  10/10/2019,3:07 AM

## 2019-10-10 NOTE — Progress Notes (Signed)
   VASCULAR SURGERY ASSESSMENT & PLAN:   TYPE B AORTIC DISSECTION: Currently not having abdominal or back pain.  Blood pressure has been under good control.   PROMINENT POPLITEAL PULSES: Duplex did not show any evidence of popliteal artery aneurysms.  PULMONARY EMBOLUS: The patient is on IV heparin for a pulmonary embolus.  Bilateral lower extremity venous duplex scan showed no evidence of DVT.  ACUTE RENAL INSUFFICIENCY: Creatinine is up to 2.29 today.  GFR is 31.  We will hold off on follow-up CT scan until renal function improves.  Continue gentle hydration.  SUBJECTIVE:   No complaints this morning.  PHYSICAL EXAM:   Vitals:   10/10/19 0545 10/10/19 0600 10/10/19 0615 10/10/19 0630  BP: 118/81 117/83 119/73 111/74  Pulse: (!) 56 (!) 58 (!) 59 (!) 56  Resp: 20 15 (!) 23 (!) 22  Temp:      TempSrc:      SpO2: 99% 99% 98% 97%  Weight:      Height:       Lungs clear bilaterally. Abdomen soft and nontender.  LABS:   Lab Results  Component Value Date   WBC 9.3 10/10/2019   HGB 12.1 (L) 10/10/2019   HCT 36.4 (L) 10/10/2019   MCV 89.0 10/10/2019   PLT 141 (L) 10/10/2019   Lab Results  Component Value Date   CREATININE 2.29 (H) 10/10/2019   CBG (last 3)  Recent Labs    10/09/19 1559 10/09/19 2143 10/10/19 0602  GLUCAP 118* 138* 145*    PROBLEM LIST:    Active Problems:   Aortic aneurysm with dissection (HCC)   CURRENT MEDS:   . amLODipine  10 mg Oral Daily  . Chlorhexidine Gluconate Cloth  6 each Topical Daily  . influenza vaccine adjuvanted  0.5 mL Intramuscular Tomorrow-1000  . insulin aspart  0-15 Units Subcutaneous TID WC  . insulin aspart  0-5 Units Subcutaneous QHS  . pneumococcal 23 valent vaccine  0.5 mL Intramuscular Tomorrow-1000  . sodium chloride flush  3 mL Intravenous Once  . sodium chloride flush  3 mL Intravenous Q12H    Deitra Mayo Office: [phone] 10/10/2019

## 2019-10-10 NOTE — Progress Notes (Signed)
NAME:  [name] [name], MRN:  [id number], DOB:  1946-07-21, LOS: 2 ADMISSION DATE:  10/08/2019, CONSULTATION DATE:  10/08/2019 REFERRING MD:  Langston Masker - [facility], CHIEF COMPLAINT:  Aortic dissection with hypertensive emergency.   HPI/course in hospital  74 year old man with 10/10 lower chest and epigastric pain. Has been on/off for several weeks, with worst episode today. States he has felt as though his legs have been about to give out on him. Has been complaining of back pain as well and is scheduled for spinal stenosis surgery but now states that now both th back and abdominal pains are simultaneous.  In ED started on esmolol infusion. Seen by Vascular Sx who recommend medical management.  Past Medical History   has a past medical history of Chronic back pain, Enlarged prostate, GERD (gastroesophageal reflux disease), History of colon polyps, History of kidney stones, History of stress test, Hyperlipidemia, and Hypertension.   has a past surgical history that includes Tonsillectomy; fatty tissue removed from stomach; Colonoscopy; Esophagogastroduodenoscopy; right ankle surgery; Lumbar laminectomy/decompression microdiscectomy (N/A, 07/08/2013); and Anterior lat lumbar fusion (Left, 12/15/2014).    Interim history/subjective:  No acute events o/n. Sleepy but reports feeling better today.   Objective   Blood pressure 106/70, pulse 60, temperature 98.5 F (36.9 C), temperature source Oral, resp. rate (!) 25, height 5\' 11"  (1.803 m), weight 87 kg, SpO2 97 %.        Intake/Output Summary (Last 24 hours) at 10/10/2019 1343 Last data filed at 10/10/2019 1300 Gross per 24 hour  Intake 2911.72 ml  Output 780 ml  Net 2131.72 ml   Filed Weights   10/08/19 1845 10/09/19 0030 10/10/19 0500  Weight: 89.4 kg 86.9 kg 87 kg    Examination:  General:  Elderly appearing male resting comfortably in bed.  Neuro:  Sleeping, but easily arouses. Is alert and oriented. Non-focal.  HEENT:  [place]/AT, No JVD noted,  PERRL Cardiovascular:  RRR, no MRG Lungs:  Clear bilateral breath sounds Abdomen:  Soft, non-distended, non-tender Musculoskeletal:  No acute deformity or ROM limitation. Extremities warm. No edema.  Skin:  Intact, MMM   Ancillary tests (personally reviewed)  CBC: Recent Labs  Lab 10/08/19 1510 10/09/19 0242 10/10/19 0208  WBC 14.1* 12.7* 9.3  HGB 14.5 13.4 12.1*  HCT 43.0 40.5 36.4*  MCV 88.1 89.6 89.0  PLT 175 146* 141*    Basic Metabolic Panel: Recent Labs  Lab 10/08/19 1510 10/09/19 0242 10/10/19 0208  NA 139 137 136  K 3.3* 3.4* 3.3*  CL 102 101 99  CO2 23 22 25   GLUCOSE 170* 159* 169*  BUN 12 16 29*  CREATININE 1.25* 1.92* 2.29*  CALCIUM 9.7 9.2 8.7*   GFR: Estimated Creatinine Clearance: 30.1 mL/min (A) (by C-G formula based on SCr of 2.29 mg/dL (H)). Recent Labs  Lab 10/08/19 1510 10/09/19 0242 10/10/19 0208  WBC 14.1* 12.7* 9.3    Liver Function Tests: No results for input(s): AST, ALT, ALKPHOS, BILITOT, PROT, ALBUMIN in the last 168 hours. No results for input(s): LIPASE, AMYLASE in the last 168 hours. No results for input(s): AMMONIA in the last 168 hours.  ABG No results found for: PHART, PCO2ART, PO2ART, HCO3, TCO2, ACIDBASEDEF, O2SAT   Coagulation Profile: Recent Labs  Lab 10/08/19 2130  INR 1.1    Cardiac Enzymes: No results for input(s): CKTOTAL, CKMB, CKMBINDEX, TROPONINI in the last 168 hours.  HbA1C: Hgb A1c MFr Bld  Date/Time Value Ref Range Status  10/09/2019 10:18 AM 6.7 (  H) 4.8 - 5.6 % Final    Comment:    (NOTE) Pre diabetes:          5.7%-6.4% Diabetes:              >6.4% Glycemic control for   <7.0% adults with diabetes     CBG: Recent Labs  Lab 10/09/19 1112 10/09/19 1559 10/09/19 2143 10/10/19 0602 10/10/19 1123  GLUCAP 126* 118* 138* 145* 146*     Assessment & Plan:   Type B distal thoracic aortic aneurysm: Background history of hypertension.  - SBP goal < 174mmHg - Labetalol infusion  -  restart home amlodipine (reports that amlodipine was stopped a couple of weeks ago 2/2 edema - Holding home benazepril-HCTZ with consideration to AKI  - PRN fentanyl for pain management. - Vascular Surgery has evaluated and recommends medical management.  --pt discriminates back pain related to DDD from abd pain with radiation  Pulmonary embolism of CT angio: small incidental finding. Study was optimized for arterial phase and would be unusual for both conditions to coexist.  - Repeat formal pulmonary arterial phase PE study once renal function recovers from initial contrast load.  - LE Dopplers negative.  - Continue cautious anticoagulation for now as d/w vascular surgery.   Type 2 diabetes on oral agents.  - hold home metformin - CBG monitoring and SSI  Dyslipidemia on statin - Continue statin  AKI: in the setting of contrast nephropathy. Creatinine 1.2 on admission.  - Gentle IVF  Daily Goals Checklist  Pain/Anxiety/Delirium protocol (if indicated): fentanyl prn for pain.  VAP protocol (if indicated): not intubated Blood pressure target: keep SBP<120 DVT prophylaxis: on IV heparin GI prophylaxis: no indicated. Fluid status goals: IV LR at 75 for 24h to avoid contrast nephropathy. Code Status: Full code Family Communication: have updated patient and family member at bedside directly Disposition: ICU  I have independently seen and examined the patient, reviewed data, and developed an assessment and plan. A total of 36 minutes were spent in critical care assessment and medical decision making. This critical care time does not reflect procedure time, or teaching time or supervisory time of PA/NP/Med student/Med Resident, etc but could involve care discussion time.  Bonna Gains, MD PhD 10/10/19 1:50 PM

## 2019-10-10 NOTE — Progress Notes (Addendum)
Pt confused, but cooperative with care. Reorients easily.

## 2019-10-10 NOTE — Progress Notes (Signed)
Ray for heparin IV Indication: PE  Allergies  Allergen Reactions  . Lyrica [Pregabalin] Nausea Only and Other (See Comments)    Hallucinations and dizziness, also  . Sulfa Antibiotics Other (See Comments)    Reaction not recalled     Patient Measurements: Height: 5\' 11"  (180.3 cm) Weight: 191 lb 12.8 oz (87 kg) IBW/kg (Calculated) : 75.3 Heparin Dosing Weight: 89.4 kg  Vital Signs: Temp: 98.3 F (36.8 C) (02/20 0735) Temp Source: Oral (02/20 0735) BP: 129/80 (02/20 1034) Pulse Rate: 55 (02/20 1030)  Labs: Recent Labs    10/08/19 1510 10/08/19 1510 10/08/19 1750 10/08/19 2130 10/09/19 0242 10/09/19 0922 10/09/19 1859 10/10/19 0208 10/10/19 1039  HGB 14.5   < >  --   --  13.4  --   --  12.1*  --   HCT 43.0  --   --   --  40.5  --   --  36.4*  --   PLT 175  --   --   --  146*  --   --  141*  --   APTT  --   --   --  30  --   --   --   --   --   LABPROT  --   --   --  14.4  --   --   --   --   --   INR  --   --   --  1.1  --   --   --   --   --   HEPARINUNFRC  --   --   --   --  <0.10*   < > 0.22* 0.33 0.29*  CREATININE 1.25*  --   --   --  1.92*  --   --  2.29*  --   TROPONINIHS 28*  --  34*  --   --   --   --   --   --    < > = values in this interval not displayed.    Estimated Creatinine Clearance: 30.1 mL/min (A) (by C-G formula based on SCr of 2.29 mg/dL (H)).  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 Patient is not taking anticoagulation PTA.   Heparin level just below goal this morning at 0.33>0.29 on recheck. No bleeding noted. Hgb down 13.4>12.1, pltc low stable 141.   Goal of Therapy:  Heparin level 0.3-0.5 units/ml Monitor platelets by anticoagulation protocol: Yes   Plan:  Increase heparin infusion to 1700 units/hr Follow up in am  Erin Hearing PharmD., BCPS Clinical  Pharmacist 10/10/2019 11:41 AM

## 2019-10-11 DIAGNOSIS — I7103 Dissection of thoracoabdominal aorta: Secondary | ICD-10-CM

## 2019-10-11 LAB — CBC
HCT: 32.7 % — ABNORMAL LOW (ref 39.0–52.0)
Hemoglobin: 11.1 g/dL — ABNORMAL LOW (ref 13.0–17.0)
MCH: 29.8 pg (ref 26.0–34.0)
MCHC: 33.9 g/dL (ref 30.0–36.0)
MCV: 87.9 fL (ref 80.0–100.0)
Platelets: 154 10*3/uL (ref 150–400)
RBC: 3.72 MIL/uL — ABNORMAL LOW (ref 4.22–5.81)
RDW: 13.9 % (ref 11.5–15.5)
WBC: 9.9 10*3/uL (ref 4.0–10.5)
nRBC: 0 % (ref 0.0–0.2)

## 2019-10-11 LAB — GLUCOSE, CAPILLARY
Glucose-Capillary: 119 mg/dL — ABNORMAL HIGH (ref 70–99)
Glucose-Capillary: 125 mg/dL — ABNORMAL HIGH (ref 70–99)
Glucose-Capillary: 147 mg/dL — ABNORMAL HIGH (ref 70–99)
Glucose-Capillary: 124 mg/dL — ABNORMAL HIGH (ref 70–99)

## 2019-10-11 LAB — BASIC METABOLIC PANEL
Anion gap: 12 (ref 5–15)
BUN: 28 mg/dL — ABNORMAL HIGH (ref 8–23)
CO2: 23 mmol/L (ref 22–32)
Calcium: 8.7 mg/dL — ABNORMAL LOW (ref 8.9–10.3)
Chloride: 101 mmol/L (ref 98–111)
Creatinine, Ser: 1.81 mg/dL — ABNORMAL HIGH (ref 0.61–1.24)
GFR calc Af Amer: 42 mL/min — ABNORMAL LOW (ref >60)
GFR calc non Af Amer: 36 mL/min — ABNORMAL LOW (ref >60)
Glucose, Bld: 143 mg/dL — ABNORMAL HIGH (ref 70–99)
Potassium: 3.5 mmol/L (ref 3.5–5.1)
Sodium: 136 mmol/L (ref 135–145)

## 2019-10-11 LAB — HEPARIN LEVEL (UNFRACTIONATED): Heparin Unfractionated: 0.38 IU/mL (ref 0.30–0.70)

## 2019-10-11 MED ORDER — LACTATED RINGERS IV SOLN
INTRAVENOUS | Status: DC
  Administered 2019-10-12: 1000 mL via INTRAVENOUS

## 2019-10-11 MED ORDER — POTASSIUM CHLORIDE CRYS ER 20 MEQ PO TBCR
20.0000 meq | EXTENDED_RELEASE_TABLET | ORAL | Status: AC
  Administered 2019-10-11 (×2): 20 meq via ORAL
  Filled 2019-10-11 (×2): qty 1

## 2019-10-11 NOTE — Progress Notes (Signed)
   VASCULAR SURGERY ASSESSMENT & PLAN:   TYPE B AORTIC DISSECTION: Currently not having abdominal or back pain.  Blood pressure has been under good control.   PROMINENT POPLITEAL PULSES: Duplex did not show any evidence of popliteal artery aneurysms.  PULMONARY EMBOLUS: The patient is on IV heparin for a pulmonary embolus.  Bilateral lower extremity venous duplex scan showed no evidence of DVT.  ACUTE RENAL INSUFFICIENCY: His creatinine is down to 1.8 today.  However I think it would be safest to wait another day before getting a follow-up CT angiogram to be sure that his renal function continues to improve. Continue gentle hydration.   SUBJECTIVE:   Resting comfortably  PHYSICAL EXAM:   Vitals:   10/11/19 0600 10/11/19 0630 10/11/19 0645 10/11/19 0700  BP: 119/68 111/79 119/69 106/75  Pulse: (!) 51 (!) 53 (!) 49 (!) 52  Resp: (!) 21 16 14 18   Temp:      TempSrc:      SpO2: 97% 96% 98% 96%  Weight:      Height:       Lungs clear  LABS:   Lab Results  Component Value Date   WBC 9.9 10/11/2019   HGB 11.1 (L) 10/11/2019   HCT 32.7 (L) 10/11/2019   MCV 87.9 10/11/2019   PLT 154 10/11/2019   Lab Results  Component Value Date   CREATININE 1.81 (H) 10/11/2019   CBG (last 3)  Recent Labs    10/10/19 1526 10/10/19 2113 10/11/19 0648  GLUCAP 143* 149* 119*    PROBLEM LIST:    Active Problems:   Aortic aneurysm with dissection (HCC)   CURRENT MEDS:   . amLODipine  10 mg Oral Daily  . atorvastatin  10 mg Oral q1800  . Chlorhexidine Gluconate Cloth  6 each Topical Daily  . influenza vaccine adjuvanted  0.5 mL Intramuscular Tomorrow-1000  . insulin aspart  0-15 Units Subcutaneous TID WC  . insulin aspart  0-5 Units Subcutaneous QHS  . pneumococcal 23 valent vaccine  0.5 mL Intramuscular Tomorrow-1000  . sodium chloride flush  3 mL Intravenous Once  . sodium chloride flush  3 mL Intravenous Q12H    Deitra Mayo Office: [phone] 10/11/2019

## 2019-10-11 NOTE — Progress Notes (Signed)
NAME:  [name] [name], MRN:  [id number], DOB:  10-May-1946, LOS: 3 ADMISSION DATE:  10/08/2019, CONSULTATION DATE:  10/08/2019 REFERRING MD:  , CHIEF COMPLAINT:  Aortic dissection with hypertensive emergency.   HPI/course in hospital  74 year old man with 10/10 lower chest and epigastric pain. Has been on/off for several weeks, with worst episode today. States he has felt as though his legs have been about to give out on him. Has been complaining of back pain as well and is scheduled for spinal stenosis surgery but now states that now both th back and abdominal pains are simultaneous.  In ED started on esmolol infusion. Seen by Vascular Sx who recommend medical management.  Past Medical History   has a past medical history of Chronic back pain, Enlarged prostate, GERD (gastroesophageal reflux disease), History of colon polyps, History of kidney stones, History of stress test, Hyperlipidemia, and Hypertension.   has a past surgical history that includes Tonsillectomy; fatty tissue removed from stomach; Colonoscopy; Esophagogastroduodenoscopy; right ankle surgery; Lumbar laminectomy/decompression microdiscectomy (N/A, 07/08/2013); and Anterior lat lumbar fusion (Left, 12/15/2014).    Interim history/subjective:  NAEO Denies pain today  Objective   Blood pressure 102/75, pulse (!) 56, temperature 97.8 F (36.6 C), temperature source Oral, resp. rate (!) 22, height 5\' 11"  (1.803 m), weight 88.5 kg, SpO2 (!) 86 %.        Intake/Output Summary (Last 24 hours) at 10/11/2019 1119 Last data filed at 10/11/2019 1100 Gross per 24 hour  Intake 1310.83 ml  Output 1727 ml  Net -416.17 ml   Filed Weights   10/09/19 0030 10/10/19 0500 10/11/19 0333  Weight: 86.9 kg 87 kg 88.5 kg    Examination:  General:  WDWN elderly male, reclined in bed NAD  Neuro:  AAOx3 following commands.  HEENT:  NCAT pink mmm trachea midline anicteric sclera  Lungs:  Symmetrical chest expansion, no accessory  muscle use, CTA bilaterally  Abdomen:  Soft round ndnt. + bowel sounds x4 Musculoskeletal:  Symmetrical bulk and tone, no obvious joint deformity, normal ROM BUE BLE  Skin:  C/d/w/i without rash    Ancillary tests (personally reviewed)  CBC: Recent Labs  Lab 10/08/19 1510 10/09/19 0242 10/10/19 0208 10/11/19 0117  WBC 14.1* 12.7* 9.3 9.9  HGB 14.5 13.4 12.1* 11.1*  HCT 43.0 40.5 36.4* 32.7*  MCV 88.1 89.6 89.0 87.9  PLT 175 146* 141* [id number]    Basic Metabolic Panel: Recent Labs  Lab 10/08/19 1510 10/09/19 0242 10/10/19 0208 10/11/19 0117  NA 139 137 136 136  K 3.3* 3.4* 3.3* 3.5  CL 102 101 99 101  CO2 23 22 25 23   GLUCOSE 170* 159* 169* 143*  BUN 12 16 29* 28*  CREATININE 1.25* 1.92* 2.29* 1.81*  CALCIUM 9.7 9.2 8.7* 8.7*   GFR: Estimated Creatinine Clearance: 38.1 mL/min (A) (by C-G formula based on SCr of 1.81 mg/dL (H)). Recent Labs  Lab 10/08/19 1510 10/09/19 0242 10/10/19 0208 10/11/19 0117  WBC 14.1* 12.7* 9.3 9.9    Liver Function Tests: No results for input(s): AST, ALT, ALKPHOS, BILITOT, PROT, ALBUMIN in the last 168 hours. No results for input(s): LIPASE, AMYLASE in the last 168 hours. No results for input(s): AMMONIA in the last 168 hours.  ABG No results found for: PHART, PCO2ART, PO2ART, HCO3, TCO2, ACIDBASEDEF, O2SAT   Coagulation Profile: Recent Labs  Lab 10/08/19 2130  INR 1.1    Cardiac Enzymes: No results for input(s): CKTOTAL, CKMB, CKMBINDEX, TROPONINI in  the last 168 hours.  HbA1C: Hgb A1c MFr Bld  Date/Time Value Ref Range Status  10/09/2019 10:18 AM 6.7 (H) 4.8 - 5.6 % Final    Comment:    (NOTE) Pre diabetes:          5.7%-6.4% Diabetes:              >6.4% Glycemic control for   <7.0% adults with diabetes     CBG: Recent Labs  Lab 10/10/19 0602 10/10/19 1123 10/10/19 1526 10/10/19 2113 10/11/19 0648  GLUCAP 145* 146* 143* 149* 119*     Assessment & Plan:   Type B distal thoracic aortic aneurysm:  Background history of hypertension.  P - Appreciate VVS following and recs, recommend medical management - SBP goal < 175mmHg - Labetalol infusion  - continue home amlodipine  - holding home diuretic with present AKI. BP has been well controlled with other agents this admission  - PRN fentanyl for pain   Pulmonary embolism of CT angio: - small incidental finding. Study was optimized for arterial phase and would be unusual for both conditions to coexist.  - LE Dopplers negative for DVT P - Continuing cautious systemic heparin for now, previously d/w VVS. Appreciate pharmacist assistance with dosing  - Repeat  PE study when renal function recovers   Type 2 diabetes on oral agents.  - hold home metformin P - SSI  Dyslipidemia - Continue statin  AKI:  in the setting of contrast nephropathy.  -Cr is down-trending P - Continue gentle IVF - VVS allowing further renal recovery before follow up CTA -- will continue to evaluate  - Trend renal indices    Daily Goals Checklist  Pain/Anxiety/Delirium protocol (if indicated): fentanyl prn for pain.  VAP protocol (if indicated): not intubated Blood pressure target: keep SBP<120 DVT prophylaxis: on IV heparin GI prophylaxis: no indicated. Fluid status goals: gently IVF for contrast mediated AKI Code Status: Full code Family Communication: patient and wife updated at bedside 2/21 Disposition: ICU  CRITICAL CARE Performed by: Cristal Generous   Total critical care time: 35 minutes  Critical care time was exclusive of separately billable procedures and treating other patients. Critical care was necessary to treat or prevent imminent or life-threatening deterioration.  Critical care was time spent personally by me on the following activities: development of treatment plan with patient and/or surrogate as well as nursing, discussions with consultants, evaluation of patient's response to treatment, examination of patient, obtaining  history from patient or surrogate, ordering and performing treatments and interventions, ordering and review of laboratory studies, ordering and review of radiographic studies, pulse oximetry and re-evaluation of patient's condition.  Eliseo Gum MSN, AGACNP-BC [place] [id number] If no answer, [id number] 10/11/2019, 11:19 AM

## 2019-10-11 NOTE — Progress Notes (Signed)
[name] [name] for heparin IV Indication: PE  Allergies  Allergen Reactions  . Lyrica [Pregabalin] Nausea Only and Other (See Comments)    Hallucinations and dizziness, also  . Sulfa Antibiotics Other (See Comments)    Reaction not recalled     Patient Measurements: Height: 5\' 11"  (180.3 cm) Weight: 195 lb 1.7 oz (88.5 kg) IBW/kg (Calculated) : 75.3 Heparin Dosing Weight: 89.4 kg  Vital Signs: Temp: 97.8 F (36.6 C) (02/21 0800) Temp Source: Oral (02/21 0800) BP: 112/73 (02/21 0900) Pulse Rate: 54 (02/21 0900)  Labs: Recent Labs    10/08/19 1510 10/08/19 1510 10/08/19 1750 10/08/19 2130 10/09/19 0242 10/09/19 0922 10/10/19 0208 10/10/19 1039 10/11/19 0117  HGB 14.5   < >  --   --  13.4  --  12.1*  --  11.1*  HCT 43.0   < >  --   --  40.5  --  36.4*  --  32.7*  PLT 175   < >  --   --  146*  --  141*  --  154  APTT  --   --   --  30  --   --   --   --   --   LABPROT  --   --   --  14.4  --   --   --   --   --   INR  --   --   --  1.1  --   --   --   --   --   HEPARINUNFRC  --   --   --   --  <0.10*   < > 0.33 0.29* 0.38  CREATININE 1.25*   < >  --   --  1.92*  --  2.29*  --  1.81*  TROPONINIHS 28*  --  34*  --   --   --   --   --   --    < > = values in this interval not displayed.    Estimated Creatinine Clearance: 38.1 mL/min (A) (by C-G formula based on SCr of 1.81 mg/dL (H)).  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 Patient is not taking anticoagulation PTA.   Heparin level at goal this morning at 0.38. No bleeding noted. Hgb down 13.4>12.1>11, pltc low stable 154.   Goal of Therapy:  Heparin level 0.3-0.5 units/ml Monitor platelets by anticoagulation protocol: Yes   Plan:  Continue heparin infusion at 1700 units/hr Follow up in am  Erin Hearing PharmD., BCPS Clinical Pharmacist 10/11/2019  10:04 AM

## 2019-10-12 LAB — BASIC METABOLIC PANEL
Anion gap: 11 (ref 5–15)
BUN: 22 mg/dL (ref 8–23)
CO2: 21 mmol/L — ABNORMAL LOW (ref 22–32)
Calcium: 8.8 mg/dL — ABNORMAL LOW (ref 8.9–10.3)
Chloride: 104 mmol/L (ref 98–111)
Creatinine, Ser: 1.6 mg/dL — ABNORMAL HIGH (ref 0.61–1.24)
GFR calc Af Amer: 48 mL/min — ABNORMAL LOW (ref >60)
GFR calc non Af Amer: 42 mL/min — ABNORMAL LOW (ref >60)
Glucose, Bld: 118 mg/dL — ABNORMAL HIGH (ref 70–99)
Potassium: 3.4 mmol/L — ABNORMAL LOW (ref 3.5–5.1)
Sodium: 136 mmol/L (ref 135–145)

## 2019-10-12 LAB — CBC
HCT: 32.6 % — ABNORMAL LOW (ref 39.0–52.0)
Hemoglobin: 10.8 g/dL — ABNORMAL LOW (ref 13.0–17.0)
MCH: 29.8 pg (ref 26.0–34.0)
MCHC: 33.1 g/dL (ref 30.0–36.0)
MCV: 90.1 fL (ref 80.0–100.0)
Platelets: 179 10*3/uL (ref 150–400)
RBC: 3.62 MIL/uL — ABNORMAL LOW (ref 4.22–5.81)
RDW: 13.8 % (ref 11.5–15.5)
WBC: 8.5 10*3/uL (ref 4.0–10.5)
nRBC: 0 % (ref 0.0–0.2)

## 2019-10-12 LAB — GLUCOSE, CAPILLARY
Glucose-Capillary: 104 mg/dL — ABNORMAL HIGH (ref 70–99)
Glucose-Capillary: 128 mg/dL — ABNORMAL HIGH (ref 70–99)
Glucose-Capillary: 97 mg/dL (ref 70–99)
Glucose-Capillary: 97 mg/dL (ref 70–99)

## 2019-10-12 LAB — HEPARIN LEVEL (UNFRACTIONATED): Heparin Unfractionated: 0.25 IU/mL — ABNORMAL LOW (ref 0.30–0.70)

## 2019-10-12 MED ORDER — APIXABAN 5 MG PO TABS
10.0000 mg | ORAL_TABLET | Freq: Two times a day (BID) | ORAL | Status: DC
  Administered 2019-10-12 (×2): 10 mg via ORAL
  Filled 2019-10-12 (×2): qty 2

## 2019-10-12 MED ORDER — POTASSIUM CHLORIDE 20 MEQ PO PACK: 40.0000 meq | PACK | Freq: Two times a day (BID) | ORAL | Status: DC

## 2019-10-12 MED ORDER — TAMSULOSIN HCL 0.4 MG PO CAPS
0.4000 mg | ORAL_CAPSULE | Freq: Every day | ORAL | Status: DC
  Administered 2019-10-12 – 2019-10-24 (×11): 0.4 mg via ORAL
  Filled 2019-10-12 (×11): qty 1

## 2019-10-12 MED ORDER — POTASSIUM CHLORIDE 20 MEQ PO PACK
40.0000 meq | PACK | Freq: Once | ORAL | Status: AC
  Administered 2019-10-12: 12:00:00 40 meq via ORAL
  Filled 2019-10-12: qty 2

## 2019-10-12 MED ORDER — POTASSIUM CHLORIDE 10 MEQ/100ML IV SOLN
10.0000 meq | INTRAVENOUS | Status: AC
  Administered 2019-10-12 (×2): 10 meq via INTRAVENOUS
  Filled 2019-10-12 (×2): qty 100

## 2019-10-12 MED ORDER — METOPROLOL TARTRATE 50 MG PO TABS
50.0000 mg | ORAL_TABLET | Freq: Two times a day (BID) | ORAL | Status: DC
  Administered 2019-10-12 – 2019-10-13 (×2): 50 mg via ORAL
  Filled 2019-10-12 (×3): qty 1

## 2019-10-12 MED ORDER — GABAPENTIN 600 MG PO TABS
300.0000 mg | ORAL_TABLET | Freq: Every day | ORAL | Status: DC
  Administered 2019-10-12 – 2019-10-23 (×11): 300 mg via ORAL
  Filled 2019-10-12 (×12): qty 1

## 2019-10-12 MED ORDER — APIXABAN 5 MG PO TABS: 5.0000 mg | ORAL_TABLET | Freq: Two times a day (BID) | ORAL | Status: DC

## 2019-10-12 MED ORDER — HYDRALAZINE HCL 25 MG PO TABS
25.0000 mg | ORAL_TABLET | Freq: Three times a day (TID) | ORAL | Status: DC
  Administered 2019-10-12 (×2): 25 mg via ORAL
  Filled 2019-10-12 (×2): qty 1

## 2019-10-12 NOTE — Progress Notes (Signed)
eLink Physician-Brief Progress Note Patient Name: Journey Rodabaugh Granja DOB: 1945-09-04 MRN: [id number]   Date of Service  10/12/2019  HPI/Events of Note  Bradycardia - HR = 54.   eICU Interventions  Will order: 1. Hold Metoprolol PO for HR < 60.     Intervention Category Major Interventions: Arrhythmia - evaluation and management  Sommer,Steven Cornelia Copa 10/12/2019, 9:35 PM

## 2019-10-12 NOTE — Plan of Care (Signed)

## 2019-10-12 NOTE — Progress Notes (Addendum)
  Progress Note    10/12/2019 10:04 AM  Subjective:  No complaints this morning.  Denies chest/back pain.  Tolerating diet.   Vitals:   10/12/19 0900 10/12/19 0959  BP: 113/66 116/76  Pulse: (!) 50   Resp: 20   Temp:    SpO2: 98%    Physical Exam: Lungs:  Non labored Extremities:  Palpable DP pulses Abdomen:  Soft, NT, ND Neurologic: A&O  CBC    Component Value Date/Time   WBC 8.5 10/12/2019 0210   RBC 3.62 (L) 10/12/2019 0210   HGB 10.8 (L) 10/12/2019 0210   HCT 32.6 (L) 10/12/2019 0210   PLT 179 10/12/2019 0210   MCV 90.1 10/12/2019 0210   MCH 29.8 10/12/2019 0210   MCHC 33.1 10/12/2019 0210   RDW 13.8 10/12/2019 0210   LYMPHSABS 1.7 12/10/2014 1106   MONOABS 0.6 12/10/2014 1106   EOSABS 0.2 12/10/2014 1106   BASOSABS 0.0 12/10/2014 1106    BMET    Component Value Date/Time   NA 136 10/12/2019 0210   K 3.4 (L) 10/12/2019 0210   CL 104 10/12/2019 0210   CO2 21 (L) 10/12/2019 0210   GLUCOSE 118 (H) 10/12/2019 0210   BUN 22 10/12/2019 0210   CREATININE 1.60 (H) 10/12/2019 0210   CALCIUM 8.8 (L) 10/12/2019 0210   GFRNONAA 42 (L) 10/12/2019 0210   GFRAA 48 (L) 10/12/2019 0210    INR    Component Value Date/Time   INR 1.1 10/08/2019 2130     Intake/Output Summary (Last 24 hours) at 10/12/2019 1004 Last data filed at 10/12/2019 0900 Gross per 24 hour  Intake 2020.96 ml  Output 1200 ml  Net 820.96 ml     Assessment/Plan:  74 y.o. male with type B Ao dissection  No further chest/back pain; no signs of malperfusion AKI: Cr 1.6 today, near baseline Repeat CTA chest/abd/pelvis tomorrow morning IV heparin for PE per primary team    Dagoberto Ligas, PA-C [facility] [phone] 10/12/2019 10:04 AM  I have independently interviewed and examined patient and agree with PA assessment and plan above. Plan to repeat CTA tomorrow a.m..   Eda Paschal. Donzetta Matters, MD [facility] [place] Office: [phone] Pager:  [phone]

## 2019-10-12 NOTE — Progress Notes (Addendum)
NAME:  DRUMMOND HANKES, MRN:  [id number], DOB:  02-28-1946, LOS: 4 ADMISSION DATE:  10/08/2019, CONSULTATION DATE:  10/08/2019 REFERRING MD:  Langston Masker - [facility], CHIEF COMPLAINT:  Aortic dissection with hypertensive emergency.   HPI/course in hospital  74 year old man with 10/10 lower chest and epigastric pain. Has been on/off for several weeks, with worst episode today. States he has felt as though his legs have been about to give out on him. Has been complaining of back pain as well and is scheduled for spinal stenosis surgery but now states that now both th back and abdominal pains are simultaneous.  In ED started on esmolol infusion. Seen by Vascular Sx who recommend medical management.  Past Medical History   has a past medical history of Chronic back pain, Enlarged prostate, GERD (gastroesophageal reflux disease), History of colon polyps, History of kidney stones, History of stress test, Hyperlipidemia, and Hypertension.   has a past surgical history that includes Tonsillectomy; fatty tissue removed from stomach; Colonoscopy; Esophagogastroduodenoscopy; right ankle surgery; Lumbar laminectomy/decompression microdiscectomy (N/A, 07/08/2013); and Anterior lat lumbar fusion (Left, 12/15/2014).    Interim history/subjective:  NAEO.  Patient a little confused per nurse.  Has had frequent feeling of needing to urinate, but only able to urinate small amounts.  Objective   Blood pressure 114/74, pulse (!) 54, temperature 98 F (36.7 C), temperature source Oral, resp. rate (!) 23, height 5\' 11"  (1.803 m), weight 88.4 kg, SpO2 96 %.        Intake/Output Summary (Last 24 hours) at 10/12/2019 0844 Last data filed at 10/12/2019 0759 Gross per 24 hour  Intake 2123.3 ml  Output 1825 ml  Net 298.3 ml   Filed Weights   10/10/19 0500 10/11/19 0333 10/12/19 0158  Weight: 87 kg 88.5 kg 88.4 kg    Examination:  General:  elderly male, reclined in bed in NAD  Neuro:  AAOx3  HEENT:  trachea midline  anicteric sclera  Lungs:  Normal WOB on RA, no accessory muscle use, diminished R lung sounds  Abdomen:  Soft non-tender, non-distended Musculoskeletal:  Symmetrical bulk and tone, no obvious joint deformity Skin: Warm and dry, no edema   Ancillary tests (personally reviewed)  CBC: Recent Labs  Lab 10/08/19 1510 10/09/19 0242 10/10/19 0208 10/11/19 0117 10/12/19 0210  WBC 14.1* 12.7* 9.3 9.9 8.5  HGB 14.5 13.4 12.1* 11.1* 10.8*  HCT 43.0 40.5 36.4* 32.7* 32.6*  MCV 88.1 89.6 89.0 87.9 90.1  PLT 175 146* 141* 154 0000000    Basic Metabolic Panel: Recent Labs  Lab 10/08/19 1510 10/09/19 0242 10/10/19 0208 10/11/19 0117 10/12/19 0210  NA 139 137 136 136 136  K 3.3* 3.4* 3.3* 3.5 3.4*  CL 102 101 99 101 104  CO2 23 22 25 23  21*  GLUCOSE 170* 159* 169* 143* 118*  BUN 12 16 29* 28* 22  CREATININE 1.25* 1.92* 2.29* 1.81* 1.60*  CALCIUM 9.7 9.2 8.7* 8.7* 8.8*   GFR: Estimated Creatinine Clearance: 43.1 mL/min (A) (by C-G formula based on SCr of 1.6 mg/dL (H)). Recent Labs  Lab 10/09/19 0242 10/10/19 0208 10/11/19 0117 10/12/19 0210  WBC 12.7* 9.3 9.9 8.5    Liver Function Tests: No results for input(s): AST, ALT, ALKPHOS, BILITOT, PROT, ALBUMIN in the last 168 hours. No results for input(s): LIPASE, AMYLASE in the last 168 hours. No results for input(s): AMMONIA in the last 168 hours.  ABG No results found for: PHART, PCO2ART, PO2ART, HCO3, TCO2, ACIDBASEDEF, O2SAT  Coagulation Profile: Recent Labs  Lab 10/08/19 2130  INR 1.1    Cardiac Enzymes: No results for input(s): CKTOTAL, CKMB, CKMBINDEX, TROPONINI in the last 168 hours.  HbA1C: Hgb A1c MFr Bld  Date/Time Value Ref Range Status  10/09/2019 10:18 AM 6.7 (H) 4.8 - 5.6 % Final    Comment:    (NOTE) Pre diabetes:          5.7%-6.4% Diabetes:              >6.4% Glycemic control for   <7.0% adults with diabetes     CBG: Recent Labs  Lab 10/11/19 0648 10/11/19 1119 10/11/19 1528 10/11/19  2027 10/12/19 0654  GLUCAP 119* 125* 147* 124* 104*     Assessment & Plan:   Type B distal thoracic aortic aneurysm: Background history of hypertension.  - Appreciate VVS following and recs, recommend medical management - SBP goal < 179mmHg - d/c Labetalol infusion - continue home amlodipine  - start metoprolol po - holding home diuretic with present AKI. BP has been well controlled with other agents this admission  - PRN fentanyl for pain   Pulmonary embolism of CT angio on 2/18: Small incidental finding. Study was optimized for arterial phase and would be unusual for both conditions to coexist. LE Dopplers negative for DVT. -d/c IV heparin -start eliquis, appreciate pharmacy recs  BPH: Hx BPH and pt c/o having to urinate often but only able to urinate very small amts - Start Flomax  Chronic Back Pain: - start gabapentin  AKI: Likely 2/2 contrast nephropathy. Cr is down-trending. - Continue gentle IVF - Daily BMP  Type 2 diabetes on oral agents. - hold home metformin - SSI  Dyslipidemia - Continue statin  Daily Goals Checklist  Pain/Anxiety/Delirium protocol (if indicated): fentanyl prn for pain.  VAP protocol (if indicated): not intubated Blood pressure target: keep SBP<120 DVT prophylaxis: on IV heparin GI prophylaxis: no indicated. Fluid status goals: gently IVF for contrast mediated AKI Code Status: Full code Family Communication: patient and wife updated at bedside 2/21 Disposition: ICU  CRITICAL CARE Performed by: Mallie Darting   Total critical care time: 35 minutes  Critical care time was exclusive of separately billable procedures and treating other patients. Critical care was necessary to treat or prevent imminent or life-threatening deterioration.  Critical care was time spent personally by me on the following activities: development of treatment plan with patient and/or surrogate as well as nursing, discussions with consultants, evaluation of  patient's response to treatment, examination of patient, obtaining history from patient or surrogate, ordering and performing treatments and interventions, ordering and review of laboratory studies, ordering and review of radiographic studies, pulse oximetry and re-evaluation of patient's condition.  Mallie Darting, MS4

## 2019-10-12 NOTE — Progress Notes (Signed)
[place] for heparin IV>> apixaban Indication: PE  Allergies  Allergen Reactions  . Lyrica [Pregabalin] Nausea Only and Other (See Comments)    Hallucinations and dizziness, also  . Sulfa Antibiotics Other (See Comments)    Reaction not recalled     Patient Measurements: Height: 5\' 11"  (180.3 cm) Weight: 194 lb 14.2 oz (88.4 kg) IBW/kg (Calculated) : 75.3 Heparin Dosing Weight: 89.4 kg  Vital Signs: Temp: 97.7 F (36.5 C) (02/22 1107) Temp Source: Oral (02/22 1107) BP: 110/71 (02/22 1100) Pulse Rate: 53 (02/22 1100)  Labs: Recent Labs    10/10/19 0208 10/10/19 0208 10/10/19 1039 10/11/19 0117 10/12/19 0210  HGB 12.1*   < >  --  11.1* 10.8*  HCT 36.4*  --   --  32.7* 32.6*  PLT 141*  --   --  154 179  HEPARINUNFRC 0.33   < > 0.29* 0.38 0.25*  CREATININE 2.29*  --   --  1.81* 1.60*   < > = values in this interval not displayed.    Estimated Creatinine Clearance: 43.1 mL/min (A) (by C-G formula based on SCr of 1.6 mg/dL (H)).  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 Patient is not taking anticoagulation PTA.   Pharmacy consulted to transition to apixaban -hg= 10.8, plt= 179   Goal of Therapy:  Monitor platelets by anticoagulation protocol: Yes   Plan:  -stop heparin  -apixaban 10mg  po bid for 7 days then 5mg  po bid  Hildred Laser, PharmD Clinical Pharmacist **Pharmacist phone directory can now be found on [email or web address] ([email or web address]).  Listed under [place].

## 2019-10-12 NOTE — Progress Notes (Signed)
[name] [name] for heparin IV Indication: PE  Allergies  Allergen Reactions  . Lyrica [Pregabalin] Nausea Only and Other (See Comments)    Hallucinations and dizziness, also  . Sulfa Antibiotics Other (See Comments)    Reaction not recalled     Patient Measurements: Height: 5\' 11"  (180.3 cm) Weight: 194 lb 14.2 oz (88.4 kg) IBW/kg (Calculated) : 75.3 Heparin Dosing Weight: 89.4 kg  Vital Signs: Temp: 98.7 F (37.1 C) (02/22 0000) Temp Source: Oral (02/22 0000) BP: 124/78 (02/22 0300) Pulse Rate: 54 (02/22 0300)  Labs: Recent Labs    10/10/19 0208 10/10/19 0208 10/10/19 1039 10/11/19 0117 10/12/19 0210  HGB 12.1*   < >  --  11.1* 10.8*  HCT 36.4*  --   --  32.7* 32.6*  PLT 141*  --   --  154 179  HEPARINUNFRC 0.33   < > 0.29* 0.38 0.25*  CREATININE 2.29*  --   --  1.81* 1.60*   < > = values in this interval not displayed.    Estimated Creatinine Clearance: 43.1 mL/min (A) (by C-G formula based on SCr of 1.6 mg/dL (H)).  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 Patient is not taking anticoagulation PTA.   Heparin level down to subtherapeutic (0.25) on gtt at 1700 units/hr. No issues with line or bleeding reported per RN.  Goal of Therapy:  Heparin level 0.3-0.5 units/ml Monitor platelets by anticoagulation protocol: Yes   Plan:  Increase heparin infusion to 1850 units/hr F/u 8 hr heparin level  Sherlon Handing, PharmD, BCPS Please see amion for complete clinical pharmacist phone list 10/12/2019 3:49 AM

## 2019-10-12 NOTE — Plan of Care (Signed)
  Problem: Pain Managment: Goal: General experience of comfort will improve Outcome: Progressing   Problem: Elimination: Goal: Will not experience complications related to urinary retention Outcome: Progressing   Problem: Activity: Goal: Risk for activity intolerance will decrease Outcome: Progressing

## 2019-10-12 NOTE — Progress Notes (Signed)
[facility] ADULT ICU REPLACEMENT PROTOCOL FOR AM LAB REPLACEMENT ONLY  The patient does apply for the [facility] Adult ICU Electrolyte Replacment Protocol based on the criteria listed below:   1. Is GFR >/= 40 ml/min? Yes.    Patient's GFR today is 42 2. Is urine output >/= 0.5 ml/kg/hr for the last 6 hours? Yes.   Patient's UOP is 1.13 ml/kg/hr 3. Is BUN < 60 mg/dL? Yes.    Patient's BUN today is 22 4. Abnormal electrolyte(s): K+ 3.45. Ordered repletion with: protocol 6. If a panic level lab has been reported, has the CCM MD in charge been notified? Yes.  .   Physician:  Dr. Nicki Gu[place], [place] 10/12/2019 5:50 AM

## 2019-10-12 NOTE — Progress Notes (Signed)
Transitions of Care Pharmacist Note  [name] [name] is a 74 y.o. male that has been diagnosed with PE and will be prescribed Eliquis (apixaban) at discharge.   Patient Education: I provided the following education on 2/22 to the patient: How to take the medication Described what the medication is Signs of bleeding Signs/symptoms of VTE and stroke  Answered their questions  Discharge Medications Plan: The patient wants to have their discharge medications filled by the Transitions of Care pharmacy rather than their usual pharmacy.  The discharge orders pharmacy has been changed to the Transitions of Care pharmacy, the patient will receive a phone call regarding co-pay, and their medications will be delivered by the Transitions of Care pharmacy.    Thank you,   Berenice Bouton, PharmD PGY1 Pharmacy Resident  Please check AMION for all [place] phone numbers After 10:00 PM, call [place] [phone]  October 12, 2019

## 2019-10-13 ENCOUNTER — Inpatient Hospital Stay (HOSPITAL_COMMUNITY): Payer: Medicare Other

## 2019-10-13 ENCOUNTER — Inpatient Hospital Stay: Payer: Self-pay

## 2019-10-13 LAB — CBC
HCT: 33.9 % — ABNORMAL LOW (ref 39.0–52.0)
Hemoglobin: 11 g/dL — ABNORMAL LOW (ref 13.0–17.0)
MCH: 29.3 pg (ref 26.0–34.0)
MCHC: 32.4 g/dL (ref 30.0–36.0)
MCV: 90.4 fL (ref 80.0–100.0)
Platelets: 196 10*3/uL (ref 150–400)
RBC: 3.75 MIL/uL — ABNORMAL LOW (ref 4.22–5.81)
RDW: 14.1 % (ref 11.5–15.5)
WBC: 7.5 10*3/uL (ref 4.0–10.5)
nRBC: 0 % (ref 0.0–0.2)

## 2019-10-13 LAB — GLUCOSE, CAPILLARY
Glucose-Capillary: 104 mg/dL — ABNORMAL HIGH (ref 70–99)
Glucose-Capillary: 104 mg/dL — ABNORMAL HIGH (ref 70–99)
Glucose-Capillary: 110 mg/dL — ABNORMAL HIGH (ref 70–99)
Glucose-Capillary: 119 mg/dL — ABNORMAL HIGH (ref 70–99)
Glucose-Capillary: 102 mg/dL — ABNORMAL HIGH (ref 70–99)

## 2019-10-13 LAB — BASIC METABOLIC PANEL
Anion gap: 10 (ref 5–15)
BUN: 17 mg/dL (ref 8–23)
CO2: 21 mmol/L — ABNORMAL LOW (ref 22–32)
Calcium: 8.9 mg/dL (ref 8.9–10.3)
Chloride: 108 mmol/L (ref 98–111)
Creatinine, Ser: 1.49 mg/dL — ABNORMAL HIGH (ref 0.61–1.24)
GFR calc Af Amer: 53 mL/min — ABNORMAL LOW (ref >60)
GFR calc non Af Amer: 46 mL/min — ABNORMAL LOW (ref >60)
Glucose, Bld: 99 mg/dL (ref 70–99)
Potassium: 4.1 mmol/L (ref 3.5–5.1)
Sodium: 139 mmol/L (ref 135–145)

## 2019-10-13 LAB — APTT: aPTT: 76 seconds — ABNORMAL HIGH (ref 24–36)

## 2019-10-13 LAB — HEPARIN LEVEL (UNFRACTIONATED): Heparin Unfractionated: >2.2 IU/mL — ABNORMAL HIGH (ref 0.30–0.70)

## 2019-10-13 MED ORDER — LACTATED RINGERS IV SOLN: INTRAVENOUS | Status: DC

## 2019-10-13 MED ORDER — SODIUM CHLORIDE 0.9% FLUSH: 10.0000 mL | INTRAVENOUS | Status: DC | PRN

## 2019-10-13 MED ORDER — SODIUM CHLORIDE 0.9% FLUSH
10.0000 mL | Freq: Two times a day (BID) | INTRAVENOUS | Status: DC
  Administered 2019-10-13 – 2019-10-23 (×14): 10 mL
  Administered 2019-10-23: 20 mL
  Administered 2019-10-24: 10 mL

## 2019-10-13 MED ORDER — HYDRALAZINE HCL 50 MG PO TABS
50.0000 mg | ORAL_TABLET | Freq: Three times a day (TID) | ORAL | Status: DC
  Administered 2019-10-13 (×3): 50 mg via ORAL
  Filled 2019-10-13 (×3): qty 1

## 2019-10-13 MED ORDER — IOHEXOL 350 MG/ML SOLN
100.0000 mL | Freq: Once | INTRAVENOUS | Status: AC | PRN
  Administered 2019-10-13: 100 mL via INTRAVENOUS

## 2019-10-13 MED ORDER — CARVEDILOL 25 MG PO TABS
25.0000 mg | ORAL_TABLET | Freq: Two times a day (BID) | ORAL | Status: DC
  Administered 2019-10-13 – 2019-10-24 (×21): 25 mg via ORAL
  Filled 2019-10-13 (×21): qty 1

## 2019-10-13 MED ORDER — HEPARIN (PORCINE) 25000 UT/250ML-% IV SOLN
2150.0000 [IU]/h | INTRAVENOUS | Status: AC
  Administered 2019-10-13: 1850 [IU]/h via INTRAVENOUS
  Filled 2019-10-13 (×3): qty 250

## 2019-10-13 NOTE — Progress Notes (Signed)
eLink Physician-Brief Progress Note Patient Name: [name] [name] DOB: April 07, 1946 MRN: [id number]   Date of Service  10/13/2019  HPI/Events of Note  RN call with patient only has 1 PIV that Heparin is infusing through.  Patient needs his PRN labetalol IVP. IV team is having difficulty getting more IV access. Requests PICC line .  HGB /Plt stable .   eICU Interventions  IV Team to evaluate and place PICC line if able.      Intervention Category Intermediate Interventions: Other:  [name] 10/13/2019, 8:16 PM

## 2019-10-13 NOTE — Progress Notes (Signed)
Dear Doctor: This patient has been identified as a candidate for PICC for the following reason (s): Multiple PIV infiltrations today. If you agree, please write an order for the indicated device.   Thank you for supporting the early vascular access assessment program.

## 2019-10-13 NOTE — Progress Notes (Signed)
PT Cancellation Note  Patient Details Name: [name] [name] MRN: [id number] DOB: 1945-10-03   Cancelled Treatment:    Reason Eval/Treat Not Completed: Medical issues which prohibited therapy. Spoke with RN. Pt just returned from CT. Awaiting results for MD to determine if surgery is indicated for aortic dissection. PT to follow and proceed with eval when medically ready.   Lorriane Shire 10/13/2019, 10:20 AM  Lorrin Goodell, PT  Office # [phone] Pager [phone]

## 2019-10-13 NOTE — Progress Notes (Signed)
Peripherally Inserted Central Catheter/Midline Placement  The IV Nurse has discussed with the patient and/or persons authorized to consent for the patient, the purpose of this procedure and the potential benefits and risks involved with this procedure.  The benefits include less needle sticks, lab draws from the catheter, and the patient may be discharged home with the catheter. Risks include, but not limited to, infection, bleeding, blood clot (thrombus formation), and puncture of an artery; nerve damage and irregular heartbeat and possibility to perform a PICC exchange if needed/ordered by physician.  Alternatives to this procedure were also discussed.  [name] Power PICC patient education guide, fact sheet on infection prevention and patient information card has been provided to patient /or left at bedside.    PICC/Midline Placement Documentation  PICC Double Lumen [id number] PICC Right Basilic 41 cm 1 cm (Active)  Indication for Insertion or Continuance of Line Poor Vasculature-patient has had multiple peripheral attempts or PIVs lasting less than 24 hours;Limited venous access - need for IV therapy >5 days (PICC only);Prolonged intravenous therapies 10/13/19 2227  Exposed Catheter (cm) 1 cm 10/13/19 2227  Site Assessment Clean;Dry;Intact 10/13/19 2227  Lumen #1 Status Flushed;Saline locked;Blood return noted 10/13/19 2227  Lumen #2 Status Flushed;Saline locked;Blood return noted 10/13/19 2227  Dressing Type Transparent 10/13/19 2227  Dressing Status Clean;Dry;Intact;Antimicrobial disc in place 10/13/19 2227  Dressing Change Due 10/20/19 10/13/19 2227       Gordan Payment 10/13/2019, 10:28 PM

## 2019-10-13 NOTE — Care Management (Signed)
[phone] 10-13-19 Consult received for benefits check for Eliquis cost. Benefits check submitted. Case Manager will follow for cost. Graves-Bigelow, Ocie Cornfield, RN,BSN Case Manager.

## 2019-10-13 NOTE — Progress Notes (Signed)
[place] Progress Note Patient Name: [name] [name] DOB: January 19, 1946 MRN: [id number]   Date of Service  10/13/2019  HPI/Events of Note  Hypertension - BP = 145/93 with HR = 57. Goal SBP < 120.  eICU Interventions  Will order: 1. Restart Labetalol IV infusion.  2. Monitor HR. 3. Increase Hydralazine to 50 mg PO Q 8 hourss.      Intervention Category Major Interventions: Hypertension - evaluation and management  [name] [name] 10/13/2019, 1:15 AM

## 2019-10-13 NOTE — Progress Notes (Signed)
NAME:  SOMNANG YELLEN, MRN:  [id number], DOB:  08-Mar-1946, LOS: 5 ADMISSION DATE:  10/08/2019, CONSULTATION DATE:  10/08/2019 REFERRING MD:  Langston Masker - [facility], CHIEF COMPLAINT:  Aortic dissection with hypertensive emergency.   HPI/course in hospital  74 year old man with 10/10 lower chest and epigastric pain. Has been on/off for several weeks, with worst episode today. States he has felt as though his legs have been about to give out on him. Has been complaining of back pain as well and is scheduled for spinal stenosis surgery but now states that now both th back and abdominal pains are simultaneous.  In ED started on esmolol infusion. Seen by Vascular Sx who recommend medical management.  Past Medical History   has a past medical history of Chronic back pain, Enlarged prostate, GERD (gastroesophageal reflux disease), History of colon polyps, History of kidney stones, History of stress test, Hyperlipidemia, and Hypertension.   has a past surgical history that includes Tonsillectomy; fatty tissue removed from stomach; Colonoscopy; Esophagogastroduodenoscopy; right ankle surgery; Lumbar laminectomy/decompression microdiscectomy (N/A, 07/08/2013); and Anterior lat lumbar fusion (Left, 12/15/2014).    Interim history/subjective:  O/N held metoprolol 2/2 bradyacrdia to 54.  Subsequently had to restart labetalol infusion due to BP of 145/93.  Increased hydralazine to 50mg  po q8 hours.  Objective   Blood pressure (!) 128/91, pulse (!) 53, temperature 99 F (37.2 C), temperature source Axillary, resp. rate (!) 27, height 5\' 11"  (1.803 m), weight 88 kg, SpO2 97 %.        Intake/Output Summary (Last 24 hours) at 10/13/2019 0934 Last data filed at 10/13/2019 0700 Gross per 24 hour  Intake 458.55 ml  Output 1500 ml  Net -1041.45 ml   Filed Weights   10/11/19 0333 10/12/19 0158 10/13/19 0500  Weight: 88.5 kg 88.4 kg 88 kg    Examination:  General:  elderly male, sleeping comfortably in bed Lungs:   normal WOB on RA, no accessory muscle use CV: bradycardic  Ancillary tests (personally reviewed)  CBC: Recent Labs  Lab 10/09/19 0242 10/10/19 0208 10/11/19 0117 10/12/19 0210 10/13/19 0232  WBC 12.7* 9.3 9.9 8.5 7.5  HGB 13.4 12.1* 11.1* 10.8* 11.0*  HCT 40.5 36.4* 32.7* 32.6* 33.9*  MCV 89.6 89.0 87.9 90.1 90.4  PLT 146* 141* 154 179 [id number]    Basic Metabolic Panel: Recent Labs  Lab 10/09/19 0242 10/10/19 0208 10/11/19 0117 10/12/19 0210 10/13/19 0232  NA 137 136 136 136 139  K 3.4* 3.3* 3.5 3.4* 4.1  CL 101 99 101 104 108  CO2 22 25 23  21* 21*  GLUCOSE 159* 169* 143* 118* 99  BUN 16 29* 28* 22 17  CREATININE 1.92* 2.29* 1.81* 1.60* 1.49*  CALCIUM 9.2 8.7* 8.7* 8.8* 8.9   GFR: Estimated Creatinine Clearance: 46.3 mL/min (A) (by C-G formula based on SCr of 1.49 mg/dL (H)). Recent Labs  Lab 10/10/19 0208 10/11/19 0117 10/12/19 0210 10/13/19 0232  WBC 9.3 9.9 8.5 7.5    Liver Function Tests: No results for input(s): AST, ALT, ALKPHOS, BILITOT, PROT, ALBUMIN in the last 168 hours. No results for input(s): LIPASE, AMYLASE in the last 168 hours. No results for input(s): AMMONIA in the last 168 hours.  ABG No results found for: PHART, PCO2ART, PO2ART, HCO3, TCO2, ACIDBASEDEF, O2SAT   Coagulation Profile: Recent Labs  Lab 10/08/19 2130  INR 1.1    Cardiac Enzymes: No results for input(s): CKTOTAL, CKMB, CKMBINDEX, TROPONINI in the last 168 hours.  HbA1C: Hgb A1c MFr  Bld  Date/Time Value Ref Range Status  10/09/2019 10:18 AM 6.7 (H) 4.8 - 5.6 % Final    Comment:    (NOTE) Pre diabetes:          5.7%-6.4% Diabetes:              >6.4% Glycemic control for   <7.0% adults with diabetes     CBG: Recent Labs  Lab 10/12/19 1106 10/12/19 1509 10/12/19 2114 10/13/19 0604 10/13/19 0726  GLUCAP 128* 97 97 104* 104*     Assessment & Plan:   Type B distal thoracic aortic dissection: Background history of hypertension.  - Appreciate VVS following  and recs, recommend medical management - SBP goal < 127mmHg - d/c Labetalol infusion - continue home amlodipine  - d/c metoprolol po - start carvedilol - holding home diuretic with CTA (h/o contrast induced nephropathy). BP has been well controlled with other agents this admission  - PRN fentanyl for pain   Pulmonary embolism of CT angio on 2/18: Small incidental finding. Study was optimized for arterial phase and would be unusual for both conditions to coexist. LE Dopplers negative for DVT. - d/c eliquis - IV heparin  BPH: Hx BPH and pt c/o having to urinate often but only able to urinate very small amts - Flomax  Chronic Back Pain: - gabapentin  AKI: Likely 2/2 contrast nephropathy. Cr is down-trending, but patient had 2nd CTA this morning (2/23) so will need to monitor closely.    - Temporarily inrease IVF - Daily BMP  Type 2 diabetes on oral agents - hold home metformin - SSI  Dyslipidemia - Continue statin  Daily Goals Checklist  Pain/Anxiety/Delirium protocol (if indicated): fentanyl prn for pain.  VAP protocol (if indicated): not intubated Blood pressure target: keep SBP<120 DVT prophylaxis: on IV heparin GI prophylaxis: no indicated. Fluid status goals: gently IVF for contrast mediated AKI Code Status: Full code Family Communication: patient and wife updated at bedside 2/22 Disposition: ICU  CRITICAL CARE Performed by: Mallie Darting   Total critical care time: 35 minutes  Critical care time was exclusive of separately billable procedures and treating other patients. Critical care was necessary to treat or prevent imminent or life-threatening deterioration.  Critical care was time spent personally by me on the following activities: development of treatment plan with patient and/or surrogate as well as nursing, discussions with consultants, evaluation of patient's response to treatment, examination of patient, obtaining history from patient or surrogate,  ordering and performing treatments and interventions, ordering and review of laboratory studies, ordering and review of radiographic studies, pulse oximetry and re-evaluation of patient's condition.  Mallie Darting, MS4

## 2019-10-13 NOTE — Progress Notes (Addendum)
  Progress Note    10/13/2019 7:53 AM   Subjective:  Not having abdominal pain  Vitals:   10/13/19 0600 10/13/19 0700  BP: 114/83 123/77  Pulse: (!) 59 (!) 51  Resp: 17 16  Temp:    SpO2: 99% 98%    Physical Exam: nad Abdomen is soft Palpable pedal pulses  CBC    Component Value Date/Time   WBC 7.5 10/13/2019 0232   RBC 3.75 (L) 10/13/2019 0232   HGB 11.0 (L) 10/13/2019 0232   HCT 33.9 (L) 10/13/2019 0232   PLT 196 10/13/2019 0232   MCV 90.4 10/13/2019 0232   MCH 29.3 10/13/2019 0232   MCHC 32.4 10/13/2019 0232   RDW 14.1 10/13/2019 0232   LYMPHSABS 1.7 12/10/2014 1106   MONOABS 0.6 12/10/2014 1106   EOSABS 0.2 12/10/2014 1106   BASOSABS 0.0 12/10/2014 1106    BMET    Component Value Date/Time   NA 139 10/13/2019 0232   K 4.1 10/13/2019 0232   CL 108 10/13/2019 0232   CO2 21 (L) 10/13/2019 0232   GLUCOSE 99 10/13/2019 0232   BUN 17 10/13/2019 0232   CREATININE 1.49 (H) 10/13/2019 0232   CALCIUM 8.9 10/13/2019 0232   GFRNONAA 46 (L) 10/13/2019 0232   GFRAA 53 (L) 10/13/2019 0232    INR    Component Value Date/Time   INR 1.1 10/08/2019 2130     Intake/Output Summary (Last 24 hours) at 10/13/2019 0753 Last data filed at 10/13/2019 0700 Gross per 24 hour  Intake 872.65 ml  Output 1500 ml  Net -627.35 ml     Assessment:  74 y.o. male is here with Type B aortic dissection. Labetalol restarted overnight.   Plan: Repeat CTA today   Kerim Statzer C. Donzetta Matters, MD [facility] Office: [phone] Pager: [phone]  10/13/2019 7:53 AM   Addendum:  I reviewed the CT angio and discussed the findings with the patient and his wife.  There has been increased size of the distal descending thoracic aorta now measuring 5.9 cm.  This is luminal plus IMH.  There is also a stable ascending thoracic aortic aneurysm and infrarenal abdominal aortic aneurysm.  There is a large dissection flap starting in the distal thoracic aorta with  dissection extending into the celiac and SMA.  All visceral branches come off of the true lumen.  Given the size increase of the thoracic aorta I have recommended thoracic endograft in.  I discussed with the patient and his wife the risk including stroke, myocardial infarction, paraplegia, blood loss requiring transfusion.  We will plan to have spinal drain placed prior to surgery.  We have restarted the heparin drip and her holding Eliquis and heparin will be stopped the night prior to surgery.  Surgery tentatively planned for Thursday morning.  All questions from patient and wife were answered and I will revisit them tomorrow.  Servando Snare

## 2019-10-13 NOTE — Progress Notes (Signed)
[place] for apixaban to heparin  Indication: PE  Allergies  Allergen Reactions  . Lyrica [Pregabalin] Nausea Only and Other (See Comments)    Hallucinations and dizziness, also  . Sulfa Antibiotics Other (See Comments)    Reaction not recalled     Patient Measurements: Height: 5\' 11"  (180.3 cm) Weight: 194 lb 0.1 oz (88 kg) IBW/kg (Calculated) : 75.3 Heparin Dosing Weight: 89.4 kg  Vital Signs: Temp: 98.1 F (36.7 C) (02/23 1150) Temp Source: Axillary (02/23 1150) BP: 116/72 (02/23 1200) Pulse Rate: 52 (02/23 1200)  Labs: Recent Labs    10/11/19 0117 10/11/19 0117 10/12/19 0210 10/13/19 0232  HGB 11.1*   < > 10.8* 11.0*  HCT 32.7*  --  32.6* 33.9*  PLT 154  --  179 196  HEPARINUNFRC 0.38  --  0.25*  --   CREATININE 1.81*  --  1.60* 1.49*   < > = values in this interval not displayed.    Estimated Creatinine Clearance: 46.3 mL/min (A) (by C-G formula based on SCr of 1.49 mg/dL (H)).  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 Patient is not taking anticoagulation PTA.   Pharmacy consulted to apixaban back to heparin (last apixaban dose was 2/22 @ 9:30pm). Repeat CT shows descending thoracic aortic dissection (significantly increased aneurysmal dilatation of the distal descending thoracic aorta) and concern for large intramural hematoma. -last heparin rate was 1850 units/hr   Goal of Therapy:  Heparin level= 0.3-0.5 Monitor platelets by anticoagulation protocol: Yes   Plan:  -no heparin bolus -Begin heparin infusion at 1850 units/hr -aPTT in 6 hours and daily wth CBC daily -Daily aPTT, heparin level and CBC  Hildred Laser, PharmD Clinical Pharmacist **Pharmacist phone directory can now be found on [email or web address] ([email or web address]).  Listed under [place].

## 2019-10-13 NOTE — Progress Notes (Signed)
[place] for apixaban to heparin  Indication: PE  Allergies  Allergen Reactions  . Lyrica [Pregabalin] Nausea Only and Other (See Comments)    Hallucinations and dizziness, also  . Sulfa Antibiotics Other (See Comments)    Reaction not recalled     Patient Measurements: Height: 5\' 11"  (180.3 cm) Weight: 194 lb 0.1 oz (88 kg) IBW/kg (Calculated) : 75.3 Heparin Dosing Weight: 89.4 kg  Vital Signs: Temp: 98.6 F (37 C) (02/23 1933) Temp Source: Oral (02/23 1933) BP: 134/70 (02/23 2215) Pulse Rate: 58 (02/23 2215)  Labs: Recent Labs    10/11/19 0117 10/11/19 0117 10/12/19 0210 10/13/19 0232 10/13/19 2135  HGB 11.1*   < > 10.8* 11.0*  --   HCT 32.7*  --  32.6* 33.9*  --   PLT 154  --  179 196  --   APTT  --   --   --   --  76*  HEPARINUNFRC 0.38  --  0.25*  --  >2.20*  CREATININE 1.81*  --  1.60* 1.49*  --    < > = values in this interval not displayed.    Estimated Creatinine Clearance: 46.3 mL/min (A) (by C-G formula based on SCr of 1.49 mg/dL (H)).  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 Patient is not taking anticoagulation PTA.   Pharmacy consulted to apixaban back to heparin (last apixaban dose was 2/22 @ 9:30pm). Repeat CT shows descending thoracic aortic dissection (significantly increased aneurysmal dilatation of the distal descending thoracic aorta) and concern for large intramural hematoma.  APTT therapeutic at 76, heparin level elevated as expected due to apixaban influence. CBC stable. No current active bleed issues reported.   Goal of Therapy:  Heparin level 0.3-0.5 APTT 66-85 Monitor platelets by anticoagulation protocol: Yes   Plan:  Continue heparin infusion at 1850 units/hr Confirmatory aPTT with AM labs Monitor daily aPTT, heparin level, CBC, and s/sx bleeding   Elicia Lamp, PharmD, BCPS Please check AMION for all [place] contact numbers Clinical Pharmacist 10/13/2019 10:39 PM

## 2019-10-14 ENCOUNTER — Encounter: Payer: Self-pay | Admitting: Vascular Surgery

## 2019-10-14 LAB — BASIC METABOLIC PANEL
Anion gap: 11 (ref 5–15)
BUN: 19 mg/dL (ref 8–23)
CO2: 18 mmol/L — ABNORMAL LOW (ref 22–32)
Calcium: 9 mg/dL (ref 8.9–10.3)
Chloride: 107 mmol/L (ref 98–111)
Creatinine, Ser: 1.46 mg/dL — ABNORMAL HIGH (ref 0.61–1.24)
GFR calc Af Amer: 54 mL/min — ABNORMAL LOW (ref >60)
GFR calc non Af Amer: 47 mL/min — ABNORMAL LOW (ref >60)
Glucose, Bld: 116 mg/dL — ABNORMAL HIGH (ref 70–99)
Potassium: 4 mmol/L (ref 3.5–5.1)
Sodium: 136 mmol/L (ref 135–145)

## 2019-10-14 LAB — APTT
aPTT: 87 seconds — ABNORMAL HIGH (ref 24–36)
aPTT: 53 seconds — ABNORMAL HIGH (ref 24–36)
aPTT: 60 seconds — ABNORMAL HIGH (ref 24–36)

## 2019-10-14 LAB — CBC
HCT: 32.4 % — ABNORMAL LOW (ref 39.0–52.0)
Hemoglobin: 10.4 g/dL — ABNORMAL LOW (ref 13.0–17.0)
MCH: 29.5 pg (ref 26.0–34.0)
MCHC: 32.1 g/dL (ref 30.0–36.0)
MCV: 92 fL (ref 80.0–100.0)
Platelets: 225 10*3/uL (ref 150–400)
RBC: 3.52 MIL/uL — ABNORMAL LOW (ref 4.22–5.81)
RDW: 14.3 % (ref 11.5–15.5)
WBC: 7.3 10*3/uL (ref 4.0–10.5)
nRBC: 0 % (ref 0.0–0.2)

## 2019-10-14 LAB — ALDOSTERONE + RENIN ACTIVITY W/ RATIO
ALDO / PRA Ratio: 8.5 (ref 0.0–30.0)
Aldosterone: 6.6 ng/dL (ref 0.0–30.0)
PRA LC/MS/MS: 0.781 ng/mL/hr (ref 0.167–5.380)

## 2019-10-14 LAB — HEPARIN LEVEL (UNFRACTIONATED)
Heparin Unfractionated: 1.66 IU/mL — ABNORMAL HIGH (ref 0.30–0.70)
Heparin Unfractionated: 1.74 IU/mL — ABNORMAL HIGH (ref 0.30–0.70)

## 2019-10-14 LAB — GLUCOSE, CAPILLARY
Glucose-Capillary: 121 mg/dL — ABNORMAL HIGH (ref 70–99)
Glucose-Capillary: 133 mg/dL — ABNORMAL HIGH (ref 70–99)
Glucose-Capillary: 124 mg/dL — ABNORMAL HIGH (ref 70–99)
Glucose-Capillary: 103 mg/dL — ABNORMAL HIGH (ref 70–99)

## 2019-10-14 MED ORDER — FUROSEMIDE 10 MG/ML IJ SOLN
40.0000 mg | Freq: Once | INTRAMUSCULAR | Status: AC
  Administered 2019-10-14: 40 mg via INTRAVENOUS
  Filled 2019-10-14: qty 4

## 2019-10-14 MED ORDER — HYDRALAZINE HCL 50 MG PO TABS
50.0000 mg | ORAL_TABLET | Freq: Four times a day (QID) | ORAL | Status: DC
  Administered 2019-10-14 – 2019-10-19 (×21): 50 mg via ORAL
  Filled 2019-10-14 (×21): qty 1

## 2019-10-14 NOTE — Progress Notes (Signed)
PT Cancellation Note  Patient Details Name: [name] MRN: [id number] DOB: 1945/10/23   Cancelled Treatment:    Reason Eval/Treat Not Completed: Patient not medically ready. Pt scheduled for aneurysm repair on Thursday 2/25 with Dr. Donzetta Matters. PT will hold until after surgery is performed and the patient is medically appropriate for PT intervention.   Zenaida Niece 10/14/2019, 7:51 AM

## 2019-10-14 NOTE — Care Management (Signed)
Per Darlyn C. W/[facility], PH# [phone]  Eliquis 2m. Bid co-pay amount for 30 day supply $47.00.  No PA required Deductible not met. Tier 3 Retail Pharmacy: [facility]  [facility] mail service: for Eliquis 547m BID 90 day supply $131.00.  ReDKE#[id number]

## 2019-10-14 NOTE — Progress Notes (Signed)
ANTICOAGULATION CONSULT NOTE - Follow Up Consult  Pharmacy Consult for heparin Indication: atrial fibrillation  Labs: Recent Labs    10/12/19 0210 10/12/19 0210 10/13/19 0232 10/13/19 2135 10/13/19 2321 10/13/19 2322 10/14/19 0344 10/14/19 0515  HGB 10.8*   < > 11.0*  --   --   --  10.4*  --   HCT 32.6*  --  33.9*  --   --   --  32.4*  --   PLT 179  --  196  --   --   --  225  --   APTT  --   --   --  76*  --  87*  --  53*  HEPARINUNFRC 0.25*   < >  --  >2.20* 1.66*  --  1.74*  --   CREATININE 1.60*  --  1.49*  --   --   --   --   --    < > = values in this interval not displayed.    Assessment: 74yo male subtherapeutic on heparin after one PTT at goal; no gtt issues or signs of bleeding per RN.  Goal of Therapy:  aPTT 66-85 seconds   Plan:  Will increase heparin gtt by ~10% to 2000 units/hr and check PTT in 8 hours.    Wynona Neat, PharmD, BCPS  10/14/2019,6:31 AM

## 2019-10-14 NOTE — Progress Notes (Signed)
NAME:  [name] [name], MRN:  [id number], DOB:  05-24-1946, LOS: 6 ADMISSION DATE:  10/08/2019, CONSULTATION DATE:  10/08/2019 REFERRING MD:  Langston Masker - [facility], CHIEF COMPLAINT:  Aortic dissection with hypertensive emergency.   HPI/course in hospital  74 year old man presented on 10/08/19 with 10/10 lower chest and epigastric pain. Had been on/off for several weeks, with worst episode on day of presentation. He was also complaining of back pain and was scheduled for spinal stenosis surgery.  Patient found to have Type B aortic dissection on CTA on 2/18 with aneurysmal dilation measuring up to approximately 4 cm.  Patient was seen by Vascular sx who recommended medical management, and patient was placed on beta blockers.  On repeat CTA on 2/22, however, the aneurysmal dilatation had increased to 5.9cm.  Patient is now scheduled for surgery on 2/25.  Past Medical History   has a past medical history of Chronic back pain, Enlarged prostate, GERD (gastroesophageal reflux disease), History of colon polyps, History of kidney stones, History of stress test, Hyperlipidemia, and Hypertension.   has a past surgical history that includes Tonsillectomy; fatty tissue removed from stomach; Colonoscopy; Esophagogastroduodenoscopy; right ankle surgery; Lumbar laminectomy/decompression microdiscectomy (N/A, 07/08/2013); and Anterior lat lumbar fusion (Left, 12/15/2014).    Interim history/subjective:  O/N several episodes of hypertension (141/117, 147/87).  Patient also significantly bradycardic.  He was given 40mg  of Lasix, and hydralazine was increased to 50mg  q6hours.  Objective   Blood pressure 120/71, pulse (!) 49, temperature 98.5 F (36.9 C), temperature source Oral, resp. rate 15, height 5\' 11"  (1.803 m), weight 88.6 kg, SpO2 99 %.        Intake/Output Summary (Last 24 hours) at 10/14/2019 0729 Last data filed at 10/14/2019 0655 Gross per 24 hour  Intake 1349.7 ml  Output 2530 ml  Net -1180.3 ml   Filed  Weights   10/12/19 0158 10/13/19 0500 10/14/19 0500  Weight: 88.4 kg 88 kg 88.6 kg    Examination:  General:  elderly male, sitting up in recliner Neuro:  alert and oriented, answering questions HEENT:  trachea midline Lungs:  Normal WOB on RA, no accessory muscle use Abdomen:  Soft non-tender to palpation, non-distended Musculoskeletal:  Symmetrical bulk and tone, no obvious joint deformity Skin: Warm and dry, no edema  Ancillary tests (personally reviewed)  CBC: Recent Labs  Lab 10/10/19 0208 10/11/19 0117 10/12/19 0210 10/13/19 0232 10/14/19 0344  WBC 9.3 9.9 8.5 7.5 7.3  HGB 12.1* 11.1* 10.8* 11.0* 10.4*  HCT 36.4* 32.7* 32.6* 33.9* 32.4*  MCV 89.0 87.9 90.1 90.4 92.0  PLT 141* 154 179 196 [id number]    Basic Metabolic Panel: Recent Labs  Lab 10/09/19 0242 10/10/19 0208 10/11/19 0117 10/12/19 0210 10/13/19 0232  NA 137 136 136 136 139  K 3.4* 3.3* 3.5 3.4* 4.1  CL 101 99 101 104 108  CO2 22 25 23  21* 21*  GLUCOSE 159* 169* 143* 118* 99  BUN 16 29* 28* 22 17  CREATININE 1.92* 2.29* 1.81* 1.60* 1.49*  CALCIUM 9.2 8.7* 8.7* 8.8* 8.9   GFR: Estimated Creatinine Clearance: 46.3 mL/min (A) (by C-G formula based on SCr of 1.49 mg/dL (H)). Recent Labs  Lab 10/11/19 0117 10/12/19 0210 10/13/19 0232 10/14/19 0344  WBC 9.9 8.5 7.5 7.3    Liver Function Tests: No results for input(s): AST, ALT, ALKPHOS, BILITOT, PROT, ALBUMIN in the last 168 hours. No results for input(s): LIPASE, AMYLASE in the last 168 hours. No results for input(s): AMMONIA in  the last 168 hours.  ABG No results found for: PHART, PCO2ART, PO2ART, HCO3, TCO2, ACIDBASEDEF, O2SAT   Coagulation Profile: Recent Labs  Lab 10/08/19 2130  INR 1.1    Cardiac Enzymes: No results for input(s): CKTOTAL, CKMB, CKMBINDEX, TROPONINI in the last 168 hours.  HbA1C: Hgb A1c MFr Bld  Date/Time Value Ref Range Status  10/09/2019 10:18 AM 6.7 (H) 4.8 - 5.6 % Final    Comment:    (NOTE) Pre diabetes:           5.7%-6.4% Diabetes:              >6.4% Glycemic control for   <7.0% adults with diabetes     CBG: Recent Labs  Lab 10/13/19 0726 10/13/19 1142 10/13/19 1637 10/13/19 2132 10/14/19 0632  GLUCAP 104* 110* 119* 102* 121*     Assessment & Plan:   Type B distal thoracic aortic dissection: Background history of hypertension. Surgery planned for 2/25. - Appreciate VVS following and recs, recommend medical management - SBP goal < 154mmHg - Labetalol infusion - continue home amlodipine  - carvedilol - hydralazine - holding home diuretic with CTA (h/o contrast induced nephropathy). BP has been well controlled with other agents this admission  - PRN fentanyl for pain  Pulmonary embolism of CT angio on 2/18: Small incidental finding. Study was optimized for arterial phase and would be unusual for both conditions to coexist. LE Dopplers negative for DVT. - IV heparin  BPH:  - Flomax  Chronic Back Pain: - gabapentin  AKI: Improving  - Daily BMP  Type 2 diabetes on oral agents - hold home metformin - SSI  Dyslipidemia - Continue statin  Daily Goals Checklist  Pain/Anxiety/Delirium protocol (if indicated): fentanyl prn for pain.  VAP protocol (if indicated): not intubated Blood pressure target: keep SBP<120 DVT prophylaxis: on IV heparin GI prophylaxis: no indicated. Fluid status goals: gently IVF for contrast mediated AKI Code Status: Full code Family Communication: patient and wife updated at bedside 2/22 Disposition: ICU  CRITICAL CARE Performed by: Mallie Darting   Total critical care time: 35 minutes  Critical care time was exclusive of separately billable procedures and treating other patients. Critical care was necessary to treat or prevent imminent or life-threatening deterioration.  Critical care was time spent personally by me on the following activities: development of treatment plan with patient and/or surrogate as well as nursing, discussions  with consultants, evaluation of patient's response to treatment, examination of patient, obtaining history from patient or surrogate, ordering and performing treatments and interventions, ordering and review of laboratory studies, ordering and review of radiographic studies, pulse oximetry and re-evaluation of patient's condition.  Mallie Darting, MS4

## 2019-10-14 NOTE — Progress Notes (Signed)
[place] for apixaban to heparin  Indication: PE  Allergies  Allergen Reactions  . Lyrica [Pregabalin] Nausea Only and Other (See Comments)    Hallucinations and dizziness, also  . Sulfa Antibiotics Other (See Comments)    Reaction not recalled     Patient Measurements: Height: 5\' 11"  (180.3 cm) Weight: 195 lb 5.2 oz (88.6 kg) IBW/kg (Calculated) : 75.3 Heparin Dosing Weight: 89.4 kg  Vital Signs: Temp: 98.5 F (36.9 C) (02/24 1522) Temp Source: Oral (02/24 1522) BP: 124/82 (02/24 1100) Pulse Rate: 54 (02/24 1100)  Labs: Recent Labs    10/12/19 0210 10/12/19 0210 10/13/19 0232 10/13/19 2135 10/13/19 2135 10/13/19 2321 10/13/19 2322 10/14/19 0344 10/14/19 0515 10/14/19 0831 10/14/19 1638  HGB 10.8*   < > 11.0*  --   --   --   --  10.4*  --   --   --   HCT 32.6*  --  33.9*  --   --   --   --  32.4*  --   --   --   PLT 179  --  196  --   --   --   --  225  --   --   --   APTT  --   --   --  76*   < >  --  87*  --  53*  --  60*  HEPARINUNFRC 0.25*   < >  --  >2.20*  --  1.66*  --  1.74*  --   --   --   CREATININE 1.60*  --  1.49*  --   --   --   --   --   --  1.46*  --    < > = values in this interval not displayed.    Estimated Creatinine Clearance: 47.3 mL/min (A) (by C-G formula based on SCr of 1.46 mg/dL (H)).  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 Patient is not taking anticoagulation PTA.   Pharmacy consulted to apixaban back to heparin (last apixaban dose was 2/22 @ 9:30pm). Repeat CT shows descending thoracic aortic dissection (significantly increased aneurysmal dilatation of the distal descending thoracic aorta) and concern for large intramural hematoma.  APTT increased but remains subtherapeutic at 60 after rate increase earlier today. Heparin level elevated as expected due to apixaban  influence. CBC stable. No bleeding or issues with infusion per discussion with RN.   Goal of Therapy:  Heparin level 0.3-0.5 APTT 66-85 Monitor platelets by anticoagulation protocol: Yes   Plan:  Increase heparin infusion to 2150 units/hr Will not check another level, as heparin scheduled to be held at midnight for OR 2/25 AM. F/u anticoagulation plans post-op Monitor CBC, s/sx bleeding   Elicia Lamp, PharmD, BCPS Please check AMION for all [place] contact numbers Clinical Pharmacist 10/14/2019 5:12 PM

## 2019-10-14 NOTE — Progress Notes (Signed)
eLink Physician-Brief Progress Note Patient Name: [name] [name] DOB: 13-Feb-1946 MRN: [id number]   Date of Service  10/14/2019  HPI/Events of Note  Hypertension - BP = 128/71. Goal SBP < 120.   eICU Interventions  Will order: 1. Lasix 40 mg IV X 1 now.  2. Increase Hydralazine to 50 mg PO Q 6 hours.      Intervention Category Major Interventions: Hypertension - evaluation and management  Sommer,Steven Eugene 10/14/2019, 12:10 AM

## 2019-10-14 NOTE — Progress Notes (Signed)
  Progress Note    10/14/2019 10:07 AM   Subjective:  No overnight  Vitals:   10/14/19 0830 10/14/19 0930  BP: 124/70 (!) 142/72  Pulse: (!) 46 (!) 51  Resp: 16 15  Temp:    SpO2: 97% 97%    Physical Exam: aaox3 Non labored respirations Abdomen is soft Pulses are palpable bilateral pt  CBC    Component Value Date/Time   WBC 7.3 10/14/2019 0344   RBC 3.52 (L) 10/14/2019 0344   HGB 10.4 (L) 10/14/2019 0344   HCT 32.4 (L) 10/14/2019 0344   PLT 225 10/14/2019 0344   MCV 92.0 10/14/2019 0344   MCH 29.5 10/14/2019 0344   MCHC 32.1 10/14/2019 0344   RDW 14.3 10/14/2019 0344   LYMPHSABS 1.7 12/10/2014 1106   MONOABS 0.6 12/10/2014 1106   EOSABS 0.2 12/10/2014 1106   BASOSABS 0.0 12/10/2014 1106    BMET    Component Value Date/Time   NA 136 10/14/2019 0831   K 4.0 10/14/2019 0831   CL 107 10/14/2019 0831   CO2 18 (L) 10/14/2019 0831   GLUCOSE 116 (H) 10/14/2019 0831   BUN 19 10/14/2019 0831   CREATININE 1.46 (H) 10/14/2019 0831   CALCIUM 9.0 10/14/2019 0831   GFRNONAA 47 (L) 10/14/2019 0831   GFRAA 54 (L) 10/14/2019 0831    INR    Component Value Date/Time   INR 1.1 10/08/2019 2130     Intake/Output Summary (Last 24 hours) at 10/14/2019 1007 Last data filed at 10/14/2019 0900 Gross per 24 hour  Intake 1384.35 ml  Output 2230 ml  Net -845.65 ml     Assessment:  74 y.o. 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  I have discussed with with the patient and again with his wife.  We will plan for spinal drainage will need to hold heparin at midnight tonight.  Plan is for thoracic endograft doing from the left subclavian to celiac artery.  I have again discussed the risk including stroke, MI, paralysis, bleeding requiring transfusion, injury to blood vessel requiring repair.  All questions were answered.   Timira Bieda C. Donzetta Matters, MD [facility] [place] Office: [phone] Pager:  [phone]  10/14/2019 10:07 AM

## 2019-10-15 ENCOUNTER — Encounter (HOSPITAL_COMMUNITY): Payer: Self-pay | Admitting: Pulmonary Disease

## 2019-10-15 ENCOUNTER — Inpatient Hospital Stay (HOSPITAL_COMMUNITY): Payer: Medicare Other

## 2019-10-15 ENCOUNTER — Encounter (HOSPITAL_COMMUNITY)
Admission: EM | Disposition: A | Discharge status: Rehabilitation Facility | Payer: Self-pay | Source: Home / Self Care | Attending: Pulmonary Disease

## 2019-10-15 HISTORY — PX: THORACIC AORTIC ENDOVASCULAR STENT GRAFT: SHX6112

## 2019-10-15 LAB — SURGICAL PCR SCREEN
MRSA, PCR: NEGATIVE
Staphylococcus aureus: NEGATIVE

## 2019-10-15 LAB — BASIC METABOLIC PANEL
Anion gap: 10 (ref 5–15)
Anion gap: 11 (ref 5–15)
BUN: 19 mg/dL (ref 8–23)
BUN: 18 mg/dL (ref 8–23)
CO2: 20 mmol/L — ABNORMAL LOW (ref 22–32)
CO2: 20 mmol/L — ABNORMAL LOW (ref 22–32)
Calcium: 8.9 mg/dL (ref 8.9–10.3)
Calcium: 8.7 mg/dL — ABNORMAL LOW (ref 8.9–10.3)
Chloride: 106 mmol/L (ref 98–111)
Chloride: 108 mmol/L (ref 98–111)
Creatinine, Ser: 1.6 mg/dL — ABNORMAL HIGH (ref 0.61–1.24)
Creatinine, Ser: 1.58 mg/dL — ABNORMAL HIGH (ref 0.61–1.24)
GFR calc Af Amer: 48 mL/min — ABNORMAL LOW (ref >60)
GFR calc Af Amer: 49 mL/min — ABNORMAL LOW (ref >60)
GFR calc non Af Amer: 42 mL/min — ABNORMAL LOW (ref >60)
GFR calc non Af Amer: 42 mL/min — ABNORMAL LOW (ref >60)
Glucose, Bld: 212 mg/dL — ABNORMAL HIGH (ref 70–99)
Glucose, Bld: 154 mg/dL — ABNORMAL HIGH (ref 70–99)
Potassium: 3.7 mmol/L (ref 3.5–5.1)
Potassium: 4.5 mmol/L (ref 3.5–5.1)
Sodium: 136 mmol/L (ref 135–145)
Sodium: 139 mmol/L (ref 135–145)

## 2019-10-15 LAB — POCT ACTIVATED CLOTTING TIME
Activated Clotting Time: 191 seconds
Activated Clotting Time: 219 seconds

## 2019-10-15 LAB — CBC
HCT: 31.6 % — ABNORMAL LOW (ref 39.0–52.0)
HCT: 32.6 % — ABNORMAL LOW (ref 39.0–52.0)
Hemoglobin: 10.4 g/dL — ABNORMAL LOW (ref 13.0–17.0)
Hemoglobin: 10.4 g/dL — ABNORMAL LOW (ref 13.0–17.0)
MCH: 30 pg (ref 26.0–34.0)
MCH: 29.7 pg (ref 26.0–34.0)
MCHC: 32.9 g/dL (ref 30.0–36.0)
MCHC: 31.9 g/dL (ref 30.0–36.0)
MCV: 91.1 fL (ref 80.0–100.0)
MCV: 93.1 fL (ref 80.0–100.0)
Platelets: 233 10*3/uL (ref 150–400)
Platelets: 234 10*3/uL (ref 150–400)
RBC: 3.47 MIL/uL — ABNORMAL LOW (ref 4.22–5.81)
RBC: 3.5 MIL/uL — ABNORMAL LOW (ref 4.22–5.81)
RDW: 14.3 % (ref 11.5–15.5)
RDW: 14.4 % (ref 11.5–15.5)
WBC: 7 10*3/uL (ref 4.0–10.5)
WBC: 7.8 10*3/uL (ref 4.0–10.5)
nRBC: 0 % (ref 0.0–0.2)
nRBC: 0 % (ref 0.0–0.2)

## 2019-10-15 LAB — TYPE AND SCREEN
ABO/RH(D): A POS
Antibody Screen: NEGATIVE

## 2019-10-15 LAB — MAGNESIUM
Magnesium: 2.1 mg/dL (ref 1.7–2.4)
Magnesium: 2 mg/dL (ref 1.7–2.4)

## 2019-10-15 LAB — GLUCOSE, CAPILLARY
Glucose-Capillary: 107 mg/dL — ABNORMAL HIGH (ref 70–99)
Glucose-Capillary: 111 mg/dL — ABNORMAL HIGH (ref 70–99)
Glucose-Capillary: 141 mg/dL — ABNORMAL HIGH (ref 70–99)
Glucose-Capillary: 123 mg/dL — ABNORMAL HIGH (ref 70–99)

## 2019-10-15 LAB — PROTIME-INR
INR: 1.2 (ref 0.8–1.2)
INR: 1.2 (ref 0.8–1.2)
Prothrombin Time: 15.3 seconds — ABNORMAL HIGH (ref 11.4–15.2)
Prothrombin Time: 15.3 seconds — ABNORMAL HIGH (ref 11.4–15.2)

## 2019-10-15 LAB — APTT: aPTT: 40 seconds — ABNORMAL HIGH (ref 24–36)

## 2019-10-15 SURGERY — INSERTION, ENDOVASCULAR STENT GRAFT, AORTA, THORACIC
Anesthesia: General | Site: Chest

## 2019-10-15 MED ORDER — GUAIFENESIN-DM 100-10 MG/5ML PO SYRP: 15.0000 mL | ORAL_SOLUTION | ORAL | Status: DC | PRN

## 2019-10-15 MED ORDER — SODIUM CHLORIDE 0.9 % IV SOLN: 500.0000 mL | Freq: Once | INTRAVENOUS | Status: DC | PRN

## 2019-10-15 MED ORDER — LACTATED RINGERS IV SOLN: INTRAVENOUS | Status: DC

## 2019-10-15 MED ORDER — POLYETHYLENE GLYCOL 3350 17 G PO PACK
17.0000 g | PACK | Freq: Every day | ORAL | Status: DC | PRN
  Administered 2019-10-17 – 2019-10-18 (×2): 17 g via ORAL
  Filled 2019-10-15 (×2): qty 1

## 2019-10-15 MED ORDER — POTASSIUM CHLORIDE CRYS ER 20 MEQ PO TBCR: 20.0000 meq | EXTENDED_RELEASE_TABLET | Freq: Every day | ORAL | Status: DC | PRN

## 2019-10-15 MED ORDER — DOUBLE ANTIBIOTIC 500-10000 UNIT/GM EX OINT
TOPICAL_OINTMENT | CUTANEOUS | Status: AC
  Filled 2019-10-15: qty 28.4

## 2019-10-15 MED ORDER — MAGNESIUM SULFATE 2 GM/50ML IV SOLN
2.0000 g | Freq: Every day | INTRAVENOUS | Status: DC | PRN
  Filled 2019-10-15: qty 50

## 2019-10-15 MED ORDER — CEFAZOLIN SODIUM-DEXTROSE 2-4 GM/100ML-% IV SOLN
INTRAVENOUS | Status: AC
  Filled 2019-10-15: qty 100

## 2019-10-15 MED ORDER — PROTAMINE SULFATE 10 MG/ML IV SOLN
INTRAVENOUS | Status: DC | PRN
  Administered 2019-10-15: 50 mg via INTRAVENOUS

## 2019-10-15 MED ORDER — SODIUM CHLORIDE 0.9 % IV SOLN
0.0500 ug/kg/min | INTRAVENOUS | Status: AC
  Administered 2019-10-15: 10:00:00 .1 ug/kg/min via INTRAVENOUS
  Filled 2019-10-15: qty 1000

## 2019-10-15 MED ORDER — PHENOL 1.4 % MT LIQD: 1.0000 | OROMUCOSAL | Status: DC | PRN

## 2019-10-15 MED ORDER — PROTAMINE SULFATE 10 MG/ML IV SOLN
INTRAVENOUS | Status: AC
  Filled 2019-10-15: qty 5

## 2019-10-15 MED ORDER — IODIXANOL 320 MG/ML IV SOLN
INTRAVENOUS | Status: DC | PRN
  Administered 2019-10-15: 150 mL

## 2019-10-15 MED ORDER — MORPHINE SULFATE (PF) 2 MG/ML IV SOLN
2.0000 mg | INTRAVENOUS | Status: DC | PRN
  Administered 2019-10-15 – 2019-10-16 (×3): 2 mg via INTRAVENOUS
  Filled 2019-10-15 (×3): qty 1

## 2019-10-15 MED ORDER — SUGAMMADEX SODIUM 200 MG/2ML IV SOLN
INTRAVENOUS | Status: DC | PRN
  Administered 2019-10-15: 200 mg via INTRAVENOUS

## 2019-10-15 MED ORDER — MIDAZOLAM HCL 2 MG/2ML IJ SOLN
INTRAMUSCULAR | Status: AC
  Filled 2019-10-15: qty 2

## 2019-10-15 MED ORDER — HYDRALAZINE HCL 20 MG/ML IJ SOLN
5.0000 mg | INTRAMUSCULAR | Status: AC | PRN
  Administered 2019-10-15 – 2019-10-18 (×2): 5 mg via INTRAVENOUS
  Filled 2019-10-15 (×2): qty 1

## 2019-10-15 MED ORDER — LACTATED RINGERS IV SOLN: INTRAVENOUS | Status: DC | PRN

## 2019-10-15 MED ORDER — CEFAZOLIN SODIUM-DEXTROSE 2-4 GM/100ML-% IV SOLN
2.0000 g | Freq: Three times a day (TID) | INTRAVENOUS | Status: AC
Start: 2019-10-15 — End: 2019-10-16
  Administered 2019-10-15 – 2019-10-16 (×2): 2 g via INTRAVENOUS
  Filled 2019-10-15 (×3): qty 100

## 2019-10-15 MED ORDER — ALUM & MAG HYDROXIDE-SIMETH 200-200-20 MG/5ML PO SUSP: 15.0000 mL | ORAL | Status: DC | PRN

## 2019-10-15 MED ORDER — FENTANYL CITRATE (PF) 250 MCG/5ML IJ SOLN
INTRAMUSCULAR | Status: AC
  Filled 2019-10-15: qty 5

## 2019-10-15 MED ORDER — MUPIROCIN 2 % EX OINT
1.0000 | TOPICAL_OINTMENT | Freq: Two times a day (BID) | CUTANEOUS | Status: AC
Start: 2019-10-15 — End: 2019-10-19
  Administered 2019-10-15 – 2019-10-19 (×10): 1 via NASAL
  Filled 2019-10-15 (×3): qty 22

## 2019-10-15 MED ORDER — DEXAMETHASONE SODIUM PHOSPHATE 10 MG/ML IJ SOLN
INTRAMUSCULAR | Status: DC | PRN
  Administered 2019-10-15: 4 mg via INTRAVENOUS

## 2019-10-15 MED ORDER — FENTANYL CITRATE (PF) 100 MCG/2ML IJ SOLN
INTRAMUSCULAR | Status: AC
  Filled 2019-10-15: qty 2

## 2019-10-15 MED ORDER — LABETALOL HCL 5 MG/ML IV SOLN
10.0000 mg | INTRAVENOUS | Status: AC | PRN
  Administered 2019-10-16 (×4): 10 mg via INTRAVENOUS
  Filled 2019-10-15 (×4): qty 4

## 2019-10-15 MED ORDER — PROPOFOL 10 MG/ML IV BOLUS
INTRAVENOUS | Status: DC | PRN
  Administered 2019-10-15: 120 mg via INTRAVENOUS

## 2019-10-15 MED ORDER — HEPARIN SODIUM (PORCINE) 1000 UNIT/ML IJ SOLN
INTRAMUSCULAR | Status: AC
  Filled 2019-10-15: qty 2

## 2019-10-15 MED ORDER — BISACODYL 10 MG RE SUPP: 10.0000 mg | Freq: Every day | RECTAL | Status: DC | PRN

## 2019-10-15 MED ORDER — IODIXANOL 320 MG/ML IV SOLN
INTRAVENOUS | Status: DC | PRN
  Administered 2019-10-15: 80 mL via INTRA_ARTERIAL

## 2019-10-15 MED ORDER — OXYCODONE-ACETAMINOPHEN 5-325 MG PO TABS
1.0000 | ORAL_TABLET | ORAL | Status: DC | PRN
  Administered 2019-10-18 (×2): 2 via ORAL
  Filled 2019-10-15 (×3): qty 2

## 2019-10-15 MED ORDER — CEFAZOLIN SODIUM-DEXTROSE 2-3 GM-%(50ML) IV SOLR
INTRAVENOUS | Status: DC | PRN
  Administered 2019-10-15: 2 g via INTRAVENOUS

## 2019-10-15 MED ORDER — ROCURONIUM BROMIDE 10 MG/ML (PF) SYRINGE
PREFILLED_SYRINGE | INTRAVENOUS | Status: DC | PRN
  Administered 2019-10-15: 60 mg via INTRAVENOUS
  Administered 2019-10-15: 40 mg via INTRAVENOUS

## 2019-10-15 MED ORDER — PANTOPRAZOLE SODIUM 40 MG PO TBEC
40.0000 mg | DELAYED_RELEASE_TABLET | Freq: Every day | ORAL | Status: DC
  Administered 2019-10-15 – 2019-10-24 (×8): 40 mg via ORAL
  Filled 2019-10-15 (×8): qty 1

## 2019-10-15 MED ORDER — SODIUM CHLORIDE 0.9 % IV SOLN
INTRAVENOUS | Status: DC
  Administered 2019-10-19: 75 mL/h via INTRAVENOUS

## 2019-10-15 MED ORDER — SODIUM CHLORIDE 0.9 % IV SOLN
INTRAVENOUS | Status: DC | PRN
  Administered 2019-10-15: 500 mL

## 2019-10-15 MED ORDER — FENTANYL CITRATE (PF) 100 MCG/2ML IJ SOLN
INTRAMUSCULAR | Status: DC | PRN
  Administered 2019-10-15 (×2): 50 ug via INTRAVENOUS

## 2019-10-15 MED ORDER — HEPARIN SODIUM (PORCINE) 1000 UNIT/ML IJ SOLN
INTRAMUSCULAR | Status: DC | PRN
  Administered 2019-10-15: 5000 [IU] via INTRAVENOUS
  Administered 2019-10-15: 9000 [IU] via INTRAVENOUS

## 2019-10-15 MED ORDER — PHENYLEPHRINE HCL-NACL 10-0.9 MG/250ML-% IV SOLN
INTRAVENOUS | Status: DC | PRN
  Administered 2019-10-15: 40 ug/min via INTRAVENOUS

## 2019-10-15 MED ORDER — METOPROLOL TARTRATE 5 MG/5ML IV SOLN: 2.0000 mg | INTRAVENOUS | Status: DC | PRN

## 2019-10-15 MED ORDER — SODIUM CHLORIDE 0.9 % IV SOLN
INTRAVENOUS | Status: AC
  Filled 2019-10-15: qty 1.2

## 2019-10-15 SURGICAL SUPPLY — 55 items
ADH SKN CLS APL DERMABOND .7 (GAUZE/BANDAGES/DRESSINGS) ×1
BAG DRN CSF CATH SYS STRL MNTR (MISCELLANEOUS) ×1
CANISTER SUCT 3000ML PPV (MISCELLANEOUS) ×3 IMPLANT
CATH ACCU-VU SIZ PIG 5F 100CM (CATHETERS) ×2 IMPLANT
CATH LUMBAR HERMETIC 14G (CATHETERS) IMPLANT
CATH VISIONS PV .035 IVUS (CATHETERS) ×2 IMPLANT
CATHETER LUMBAR HERMETIC 14G (CATHETERS) ×3
COVER PROBE W GEL 5X96 (DRAPES) ×3 IMPLANT
COVER WAND RF STERILE (DRAPES) ×1 IMPLANT
DERMABOND ADVANCED (GAUZE/BANDAGES/DRESSINGS) ×2
DERMABOND ADVANCED .7 DNX12 (GAUZE/BANDAGES/DRESSINGS) ×1 IMPLANT
DEVICE CLOSURE PERCLS PRGLD 6F (VASCULAR PRODUCTS) IMPLANT
DRAPE ZERO GRAVITY STERILE (DRAPES) ×3 IMPLANT
DRSG TEGADERM 2-3/8X2-3/4 SM (GAUZE/BANDAGES/DRESSINGS) ×3 IMPLANT
ELECT CAUTERY BLADE 6.4 (BLADE) ×3 IMPLANT
ELECT REM PT RETURN 9FT ADLT (ELECTROSURGICAL) ×6
ELECTRODE REM PT RTRN 9FT ADLT (ELECTROSURGICAL) ×2 IMPLANT
GLOVE BIO SURGEON STRL SZ7.5 (GLOVE) ×3 IMPLANT
GOWN STRL REUS W/ TWL LRG LVL3 (GOWN DISPOSABLE) ×2 IMPLANT
GOWN STRL REUS W/ TWL XL LVL3 (GOWN DISPOSABLE) ×1 IMPLANT
GOWN STRL REUS W/TWL LRG LVL3 (GOWN DISPOSABLE) ×6
GOWN STRL REUS W/TWL XL LVL3 (GOWN DISPOSABLE) ×3
GRAFT BALLN CATH 65CM (STENTS) IMPLANT
HEMOSTAT SNOW SURGICEL 2X4 (HEMOSTASIS) IMPLANT
KIT BASIN OR (CUSTOM PROCEDURE TRAY) ×3 IMPLANT
KIT TURNOVER KIT B (KITS) ×3 IMPLANT
NDL PERC 18GX7CM (NEEDLE) ×1 IMPLANT
NEEDLE PERC 18GX7CM (NEEDLE) ×3 IMPLANT
NS IRRIG 1000ML POUR BTL (IV SOLUTION) ×3 IMPLANT
PACK ENDOVASCULAR (PACKS) ×3 IMPLANT
PAD ARMBOARD 7.5X6 YLW CONV (MISCELLANEOUS) ×6 IMPLANT
PENCIL BUTTON HOLSTER BLD 10FT (ELECTRODE) ×3 IMPLANT
PERCLOSE PROGLIDE 6F (VASCULAR PRODUCTS) ×3
SET MICROPUNCTURE 5F STIFF (MISCELLANEOUS) ×2 IMPLANT
SHEATH AVANTI 11CM 8FR (SHEATH) IMPLANT
SHIELD RADPAD SCOOP 12X17 (MISCELLANEOUS) ×6 IMPLANT
SLEEVE ISOL F/PACE RF HD COVER (MISCELLANEOUS) ×2 IMPLANT
STENT GRAFT BALLN CATH 65CM (STENTS)
STENT GRFT THORAC ACS 37X37X20 (Endovascular Graft) ×4 IMPLANT
STOPCOCK MORSE 400PSI 3WAY (MISCELLANEOUS) ×3 IMPLANT
SUT ETHILON 3 0 PS 1 (SUTURE) IMPLANT
SUT MNCRL AB 4-0 PS2 18 (SUTURE) IMPLANT
SUT PROLENE 5 0 C 1 24 (SUTURE) IMPLANT
SUT VIC AB 2-0 CT1 27 (SUTURE)
SUT VIC AB 2-0 CT1 TAPERPNT 27 (SUTURE) IMPLANT
SUT VIC AB 3-0 SH 27 (SUTURE)
SUT VIC AB 3-0 SH 27X BRD (SUTURE) IMPLANT
SYR 30ML LL (SYRINGE) IMPLANT
SYR 50ML LL SCALE MARK (SYRINGE) ×3 IMPLANT
SYSTEM CSF EXTERNAL DRAINAGE (MISCELLANEOUS) ×2 IMPLANT
TOWEL GREEN STERILE (TOWEL DISPOSABLE) ×3 IMPLANT
TRAY FOLEY MTR SLVR 16FR STAT (SET/KITS/TRAYS/PACK) ×3 IMPLANT
TUBING HIGH PRESSURE 120CM (CONNECTOR) ×3 IMPLANT
WIRE BENTSON .035X145CM (WIRE) IMPLANT
WIRE STIFF LUNDERQUIST 260CM (WIRE) IMPLANT

## 2019-10-15 NOTE — Plan of Care (Signed)

## 2019-10-15 NOTE — Anesthesia Procedure Notes (Addendum)
Anesthesia Procedure Note Following induction of anesthesia and uneventful intubation, lumbar spinal drain inserted in R. Lateral decubitus position under sterile conditions, L2-3  first pass midline with 16 G Tuohy needle. Clear CSF catheter inserted 5 cm. Good CSF flow. Global TEG 6S assay done prior to procedure demonstrated no heparin effect present and normal global coagulation function

## 2019-10-15 NOTE — Anesthesia Procedure Notes (Signed)
Procedure Name: Intubation Date/Time: 10/15/2019 9:41 AM Performed by: Barrington Ellison, CRNA Pre-anesthesia Checklist: Patient identified, Emergency Drugs available, Suction available and Patient being monitored Patient Re-evaluated:Patient Re-evaluated prior to induction Oxygen Delivery Method: Circle system utilized Preoxygenation: Pre-oxygenation with 100% oxygen Induction Type: IV induction Ventilation: Mask ventilation without difficulty and Oral airway inserted - appropriate to patient size Laryngoscope Size: Sabra Heck and 2 Grade View: Grade I Tube type: Oral Tube size: 8.0 mm Number of attempts: 1 Airway Equipment and Method: Stylet and Oral airway Placement Confirmation: ETT inserted through vocal cords under direct vision,  positive ETCO2 and breath sounds checked- equal and bilateral Secured at: 22 cm Tube secured with: Tape Dental Injury: Teeth and Oropharynx as per pre-operative assessment  Comments: Performed by Steele Berg

## 2019-10-15 NOTE — Progress Notes (Signed)
Notified ELink of potassium level 3.7.  No new orders at this time.

## 2019-10-15 NOTE — Anesthesia Procedure Notes (Signed)
Central Venous Catheter Insertion Performed by: Roberts Gaudy, MD, anesthesiologist Start/End2/25/2021 9:55 AM, 10/15/2019 10:05 AM Patient location: Pre-op. Preanesthetic checklist: patient identified, IV checked, site marked, risks and benefits discussed, surgical consent, monitors and equipment checked, pre-op evaluation, timeout performed and anesthesia consent Lidocaine 1% used for infiltration and patient sedated Hand hygiene performed  and maximum sterile barriers used  Catheter size: 8 Fr Total catheter length 16. Central line was placed.Double lumen Procedure performed using ultrasound guided technique. Ultrasound Notes:anatomy identified, needle tip was noted to be adjacent to the nerve/plexus identified, no ultrasound evidence of intravascular and/or intraneural injection and image(s) printed for medical record Attempts: 1 Following insertion, dressing applied and line sutured. Post procedure assessment: blood return through all ports  Patient tolerated the procedure well with no immediate complications.

## 2019-10-15 NOTE — Transfer of Care (Signed)
Immediate Anesthesia Transfer of Care Note  Patient: Prabhav Montgomery Kho  Procedure(s) Performed: THORACIC AORTIC ENDOVASCULAR STENT GRAFT (N/A Chest)  Patient Location: PACU  Anesthesia Type:General  Level of Consciousness: awake and drowsy  Airway & Oxygen Therapy: Patient Spontanous Breathing and Patient connected to face mask oxygen  Post-op Assessment: Report given to RN, Post -op Vital signs reviewed and stable and Patient moving all extremities  Post vital signs: stable  Last Vitals:  Vitals Value Taken Time  BP    Temp    Pulse 62 10/15/19 1156  Resp 17 10/15/19 1156  SpO2 100 % 10/15/19 1156  Vitals shown include unvalidated device data.  Last Pain:  Vitals:   10/15/19 0800  TempSrc:   PainSc: 0-No pain      Patients Stated Pain Goal: 0 ([id number])  Complications: No apparent anesthesia complications

## 2019-10-15 NOTE — Progress Notes (Signed)
Anesthesiology Note:  [name] [name]. [name] is a 73 year old male with an acute type B aortic dissection who underwent insertion of a thoracic endovascular stent graft by Dr. Donzetta Matters.  A lumbar CSF drain was placed at L2-3 following after induction of anesthesia and prior to the procedure.  After the patient was turned supine in the operating room, there was minimal drainage of CSF from the catheter. Following the procedure  in the ICU, the catheter was pulled back by me 1.5 cm with improvement in drainage of clear CSF. The CSF draining monitoring system was set to maintain the CSF pressure of 8 to 12 cm of water.  Plan to continue CSF drainage tonight and reevaluate in a.m.  Patient has normal neurologic function in his lower extremities.  Roberts Gaudy, MD

## 2019-10-15 NOTE — Anesthesia Postprocedure Evaluation (Signed)
Anesthesia Post Note  Patient: Zylen Hoganson Ludke  Procedure(s) Performed: THORACIC AORTIC ENDOVASCULAR STENT GRAFT (N/A Chest)     Patient location during evaluation: SICU Anesthesia Type: General Level of consciousness: awake and alert and patient cooperative Pain management: pain level controlled Vital Signs Assessment: post-procedure vital signs reviewed and stable Respiratory status: spontaneous breathing, nonlabored ventilation and patient connected to nasal cannula oxygen Cardiovascular status: stable Postop Assessment: no apparent nausea or vomiting Anesthetic complications: no    Last Vitals:  Vitals:   10/15/19 1400 10/15/19 1415  BP:    Pulse: (!) 59 (!) 56  Resp: 16 16  Temp:    SpO2: 97% 97%    Last Pain:  Vitals:   10/15/19 0800  TempSrc:   PainSc: 0-No pain                 Micayla Brathwaite COKER

## 2019-10-15 NOTE — Progress Notes (Signed)
NAME:  [name] [name], MRN:  [id number], DOB:  01-Aug-1946, LOS: 7 ADMISSION DATE:  10/08/2019, CONSULTATION DATE:  10/08/2019 REFERRING MD:  [name] [name] - [facility], CHIEF COMPLAINT:  Aortic dissection with hypertensive emergency.   HPI/course in hospital  74 year old man presented on 10/08/19 with 10/10 lower chest and epigastric pain. Had been on/off for several weeks, with worst episode on day of presentation. He was also complaining of back pain and was scheduled for spinal stenosis surgery.  Patient found to have Type B aortic dissection on CTA on 2/18 with aneurysmal dilation measuring up to approximately 4 cm.  Patient was seen by Vascular sx who recommended medical management, and patient was placed on beta blockers.  On repeat CTA on 2/22, however, the aneurysmal dilatation had increased to 5.9cm.  Patient had surgical repair on 2/25.  Past Medical History   has a past medical history of Chronic back pain, Enlarged prostate, GERD (gastroesophageal reflux disease), History of colon polyps, History of kidney stones, History of stress test, Hyperlipidemia, and Hypertension.   has a past surgical history that includes Tonsillectomy; fatty tissue removed from stomach; Colonoscopy; Esophagogastroduodenoscopy; right ankle surgery; Lumbar laminectomy/decompression microdiscectomy (N/A, 07/08/2013); and Anterior lat lumbar fusion (Left, 12/15/2014).    Interim history/subjective:  O/N NAEON.  Patient gone to surgery in the morning.  Objective   Blood pressure (!) 118/59, pulse 63, temperature 98.3 F (36.8 C), resp. rate 15, height 5\' 11"  (1.803 m), weight 88.6 kg, SpO2 95 %.        Intake/Output Summary (Last 24 hours) at 10/15/2019 1343 Last data filed at 10/15/2019 1200 Gross per 24 hour  Intake 2064.44 ml  Output 1479 ml  Net 585.44 ml   Filed Weights   10/12/19 0158 10/13/19 0500 10/14/19 0500  Weight: 88.4 kg 88 kg 88.6 kg    Examination (after return from sx):  General:  elderly male,  still drowsy from anesthesia Neuro:  alert and oriented, answering questions HEENT:  trachea midline Lungs:  Normal WOB on RA, no accessory muscle use  Ancillary tests (personally reviewed)  CBC: Recent Labs  Lab 10/12/19 0210 10/13/19 0232 10/14/19 0344 10/15/19 0507 10/15/19 1215  WBC 8.5 7.5 7.3 7.0 7.8  HGB 10.8* 11.0* 10.4* 10.4* 10.4*  HCT 32.6* 33.9* 32.4* 31.6* 32.6*  MCV 90.1 90.4 92.0 91.1 93.1  PLT 179 196 225 233 [id number]    Basic Metabolic Panel: Recent Labs  Lab 10/12/19 0210 10/13/19 0232 10/14/19 0831 10/15/19 0507 10/15/19 1215  NA 136 139 136 136 139  K 3.4* 4.1 4.0 3.7 4.5  CL 104 108 107 106 108  CO2 21* 21* 18* 20* 20*  GLUCOSE 118* 99 116* 212* 154*  BUN 22 17 19 19 18   CREATININE 1.60* 1.49* 1.46* 1.60* 1.58*  CALCIUM 8.8* 8.9 9.0 8.9 8.7*  MG  --   --   --  2.1 2.0   GFR: Estimated Creatinine Clearance: 43.7 mL/min (A) (by C-G formula based on SCr of 1.58 mg/dL (H)). Recent Labs  Lab 10/13/19 0232 10/14/19 0344 10/15/19 0507 10/15/19 1215  WBC 7.5 7.3 7.0 7.8    Liver Function Tests: No results for input(s): AST, ALT, ALKPHOS, BILITOT, PROT, ALBUMIN in the last 168 hours. No results for input(s): LIPASE, AMYLASE in the last 168 hours. No results for input(s): AMMONIA in the last 168 hours.  ABG No results found for: PHART, PCO2ART, PO2ART, HCO3, TCO2, ACIDBASEDEF, O2SAT   Coagulation Profile: Recent Labs  Lab 10/08/19 2130  10/15/19 0507 10/15/19 1215  INR 1.1 1.2 1.2    Cardiac Enzymes: No results for input(s): CKTOTAL, CKMB, CKMBINDEX, TROPONINI in the last 168 hours.  HbA1C: Hgb A1c MFr Bld  Date/Time Value Ref Range Status  10/09/2019 10:18 AM 6.7 (H) 4.8 - 5.6 % Final    Comment:    (NOTE) Pre diabetes:          5.7%-6.4% Diabetes:              >6.4% Glycemic control for   <7.0% adults with diabetes     CBG: Recent Labs  Lab 10/14/19 1520 10/14/19 2122 10/15/19 0609 10/15/19 0846 10/15/19 1157  GLUCAP  124* 103* 107* 111* 141*     Assessment & Plan:   Type B distal thoracic aortic dissection: Background history of hypertension.  - Surgery today (2/25) - Appreciate VVS following and recs - SBP goal < 154mmHg - Labetalol infusion - continue home amlodipine  - carvedilol - hydralazine - holding home diuretic with CTA (h/o contrast induced nephropathy). BP has been well controlled with other agents this admission  - PRN fentanyl for pain  Pulmonary embolism of CT angio on 2/18: Small incidental finding. Study was optimized for arterial phase and would be unusual for both conditions to coexist. LE Dopplers negative for DVT. - d/c IV heparin for sx  BPH:  - Flomax  Chronic Back Pain: - gabapentin  AKI: Improving  - Daily BMP  Type 2 diabetes on oral agents - hold home metformin - SSI  Dyslipidemia - Continue statin  Daily Goals Checklist  Pain/Anxiety/Delirium protocol (if indicated): fentanyl prn for pain.  VAP protocol (if indicated): not intubated Blood pressure target: keep SBP<120 DVT prophylaxis: on IV heparin GI prophylaxis: no indicated. Fluid status goals: gently IVF for contrast mediated AKI Code Status: Full code Family Communication: patient and wife updated at bedside 2/22 Disposition: ICU  CRITICAL CARE Performed by: Mallie Darting   Total critical care time: 35 minutes  Critical care time was exclusive of separately billable procedures and treating other patients. Critical care was necessary to treat or prevent imminent or life-threatening deterioration.  Critical care was time spent personally by me on the following activities: development of treatment plan with patient and/or surrogate as well as nursing, discussions with consultants, evaluation of patient's response to treatment, examination of patient, obtaining history from patient or surrogate, ordering and performing treatments and interventions, ordering and review of laboratory studies,  ordering and review of radiographic studies, pulse oximetry and re-evaluation of patient's condition.  Mallie Darting, MS4

## 2019-10-15 NOTE — Progress Notes (Signed)
  Progress Note    10/15/2019 9:04 AM Day of Surgery  Subjective:  No overnight issues  Vitals:   10/15/19 0731 10/15/19 0800  BP:  115/74  Pulse:  (!) 57  Resp:  (!) 22  Temp: 98.1 F (36.7 C)   SpO2:  93%    Physical Exam: aaox3 Non labored respirations Abdomen is soft 2+ pedal pulses  CBC    Component Value Date/Time   WBC 7.0 10/15/2019 0507   RBC 3.47 (L) 10/15/2019 0507   HGB 10.4 (L) 10/15/2019 0507   HCT 31.6 (L) 10/15/2019 0507   PLT 233 10/15/2019 0507   MCV 91.1 10/15/2019 0507   MCH 30.0 10/15/2019 0507   MCHC 32.9 10/15/2019 0507   RDW 14.3 10/15/2019 0507   LYMPHSABS 1.7 12/10/2014 1106   MONOABS 0.6 12/10/2014 1106   EOSABS 0.2 12/10/2014 1106   BASOSABS 0.0 12/10/2014 1106    BMET    Component Value Date/Time   NA 136 10/15/2019 0507   K 3.7 10/15/2019 0507   CL 106 10/15/2019 0507   CO2 20 (L) 10/15/2019 0507   GLUCOSE 212 (H) 10/15/2019 0507   BUN 19 10/15/2019 0507   CREATININE 1.60 (H) 10/15/2019 0507   CALCIUM 8.9 10/15/2019 0507   GFRNONAA 42 (L) 10/15/2019 0507   GFRAA 48 (L) 10/15/2019 0507    INR    Component Value Date/Time   INR 1.2 10/15/2019 0507     Intake/Output Summary (Last 24 hours) at 10/15/2019 [id number] Last data filed at 10/15/2019 0800 Gross per 24 hour  Intake 1487.9 ml  Output 1026 ml  Net 461.9 ml     Assessment:  74 y.o. male is here with complicated Type B aortic dissection Plan: OR today for TEVAR with spinal drainage Should be able to liberalize bp goals post procedure Heparin drip off since midnight  Othella Slappey C. Donzetta Matters, MD [facility] [place] Office: [phone] Pager: [phone]  10/15/2019 9:04 AM

## 2019-10-15 NOTE — Progress Notes (Signed)
  Progress Note    10/15/2019 1:40 PM Day of Surgery  Subjective:  Some positional discomfort but otherwise says he is just feeling tired  Vitals:   10/15/19 1230 10/15/19 1242  BP:    Pulse: 63 63  Resp: 16 15  Temp:    SpO2: 97% 95%   Physical Exam: General: somewhat drowsy post op, not in any distress Lungs:  Non labored Extremities: 2+ radial pulses bilaterally; 2+ bilateral femoral pulses, right femoral access site clean,dry and intact. No hematoma. No swelling. No ecchymosis. Palpable distal pulses bilaterally DP > PT on the right. Brisk doppler signals DP/PT/Peroneal Abdomen:  Soft, non tender Neurologic: Alert and oriented Spinal drain with clear CSF  CBC    Component Value Date/Time   WBC 7.8 10/15/2019 1215   RBC 3.50 (L) 10/15/2019 1215   HGB 10.4 (L) 10/15/2019 1215   HCT 32.6 (L) 10/15/2019 1215   PLT 234 10/15/2019 1215   MCV 93.1 10/15/2019 1215   MCH 29.7 10/15/2019 1215   MCHC 31.9 10/15/2019 1215   RDW 14.4 10/15/2019 1215   LYMPHSABS 1.7 12/10/2014 1106   MONOABS 0.6 12/10/2014 1106   EOSABS 0.2 12/10/2014 1106   BASOSABS 0.0 12/10/2014 1106    BMET    Component Value Date/Time   NA 139 10/15/2019 1215   K 4.5 10/15/2019 1215   CL 108 10/15/2019 1215   CO2 20 (L) 10/15/2019 1215   GLUCOSE 154 (H) 10/15/2019 1215   BUN 18 10/15/2019 1215   CREATININE 1.58 (H) 10/15/2019 1215   CALCIUM 8.7 (L) 10/15/2019 1215   GFRNONAA 42 (L) 10/15/2019 1215   GFRAA 49 (L) 10/15/2019 1215    INR    Component Value Date/Time   INR 1.2 10/15/2019 1215     Intake/Output Summary (Last 24 hours) at 10/15/2019 1340 Last data filed at 10/15/2019 1200 Gross per 24 hour  Intake 2064.44 ml  Output 1479 ml  Net 585.44 ml     Assessment/Plan:  74 y.o. male is s/p percutaneous right common femoral access, intravascular ultrasound thoracic and abdominal aorta, thoracic Endograft with proximal and distal thoracic stent graft Day of Surgery for acute type B  aortic dissection - patient doing well post op with good perfusion bilateral lower and upper extremities - will plan to restart his heparin tomorrow 2/24 because of spinal drain - to CVICU 16 today   DVT prophylaxis:     Karoline Caldwell, PA-C [facility] [phone] 10/15/2019 1:40 PM

## 2019-10-15 NOTE — Anesthesia Preprocedure Evaluation (Signed)
Anesthesia Evaluation  Patient identified by MRN, date of birth, ID band Patient awake    Reviewed: Allergy & Precautions, NPO status , Patient's Chart, lab work & pertinent test results  Airway Mallampati: II  TM Distance: >3 FB Neck ROM: Full    Dental  (+) Teeth Intact, Dental Advisory Given   Pulmonary former smoker,    breath sounds clear to auscultation       Cardiovascular hypertension,  Rhythm:Regular Rate:Normal     Neuro/Psych    GI/Hepatic   Endo/Other    Renal/GU      Musculoskeletal   Abdominal   Peds  Hematology   Anesthesia Other Findings   Reproductive/Obstetrics                             Anesthesia Physical Anesthesia Plan  ASA: III  Anesthesia Plan: General   Post-op Pain Management:    Induction: Intravenous  PONV Risk Score and Plan: Ondansetron and Dexamethasone  Airway Management Planned: Oral ETT  Additional Equipment: Arterial line  Intra-op Plan:   Post-operative Plan: Extubation in OR  Informed Consent: I have reviewed the patients History and Physical, chart, labs and discussed the procedure including the risks, benefits and alternatives for the proposed anesthesia with the patient or authorized representative who has indicated his/her understanding and acceptance.     Dental advisory given  Plan Discussed with: Anesthesiologist and CRNA  Anesthesia Plan Comments:         Anesthesia Quick Evaluation

## 2019-10-15 NOTE — Anesthesia Procedure Notes (Signed)
Arterial Line Insertion Start/End2/25/2021 8:50 AM, 10/15/2019 8:55 AM Performed by: Barrington Ellison, CRNA, CRNA  Patient location: Pre-op. Preanesthetic checklist: patient identified, IV checked, risks and benefits discussed, surgical consent, monitors and equipment checked, pre-op evaluation, timeout performed and anesthesia consent Lidocaine 1% used for infiltration Right, radial was placed Catheter size: 20 G Hand hygiene performed , maximum sterile barriers used  and Seldinger technique used  Attempts: 1 Procedure performed without using ultrasound guided technique. Following insertion, dressing applied and Biopatch. Post procedure assessment: normal and unchanged  Patient tolerated the procedure well with no immediate complications. Additional procedure comments: Performed by R Lyndle Herrlich.

## 2019-10-15 NOTE — Op Note (Signed)
Patient name: [name] [name] MRN: [id number] DOB: Nov 17, 1945 Sex: male  10/15/2019 Pre-operative Diagnosis: acute type B aortic dissection Post-operative diagnosis:  Same Surgeon:  Erlene Quan C. Donzetta Matters, MD Assistant: Annamarie Major, MD Procedure Performed: 1.  Percutaneous right common femoral access and closure 2.  Intravascular ultrasound thoracic and abdominal aorta 3.  Thoracic endograft with proximal and distal 37 x 20 cm Gore conformable thoracic stent graft  Indications: 74 year old male presented with acute type B aortic dissection.  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  He was therefore indicated for the above procedure.  Findings: We are able to get true luminal by spinning a pigtail catheter.  By IVUS we identified all of our branch vessels.  We placed two 37 mm grafts beginning just distal to the subclavian ending approximately 1 cm proximal to the celiac artery.  At completion he had good flow to his feet.  His visceral vessels appeared to fill without limitation.  If he has further degeneration we will need to consider you branching of the subclavian artery in coverage up to the level of the innominate artery.   Procedure:  The patient was identified in the holding area and taken to the operating room where initially spinal drain was placed by anesthesia.  He was then placed supine operative when general anesthesia induced.  He sterilely prepped and draped in the chest abdomen bilateral groins in usual fashion antibiotics were minister timeout was called.  Ultrasound was used to identify the right common femoral artery.  This was cannulated with micropuncture needle followed by wire sheath.  We placed a Bentson wire followed by 8 [place] dilator.  2 ProGlide devices were deployed and an 8 [place] sheath was placed.  We then placed a Bentson wire placed a pigtail to the right common iliac artery.  We are able to spend this  to get into the true lumen place it in the a sending aorta.  Patient was fully heparinized at this time with 9000 units of heparin at additional 5000 was given after this with ACT of 219.  We then placed a Lunderquist wire.  Intravascular ultrasound was performed of the entire aorta.  We brought our main body device up to the level of the subclavian artery.  We then buddy wire pigtail catheter back up to the ascending aorta and performed angiography.  Main body was deployed.  We then brought the distal device in the place.  Angiography was performed distal to identify her celiac artery and the device was then deployed.  We completed intravascular ultrasound of the entire thoracic aorta.  We then performed completion angiography.  Unfortunately we are about a centimeter off from the subclavian artery but by IVUS the aorta was healthy there.  We elected that we could later to branch and extend to the innominate artery if necessary.  We performed angiography from the lateral demonstrated that her celiac and SMA did fill.  Satisfied with this we removed our catheters.  We left a Bentson wire in place.  We then removed our sheath since her Pro-glide devices.  Patient had good signals at his foot on the right and palpable left common femoral pulse.  Protamine was administered to 50 mg total.  We then closed our groin incision with Dermabond.  He was awakened anesthesia noted to be moving all extremities well was transferred to the PACU in stable condition.  Counts were correct at completion.  EBL: 50 cc  Sanika Brosious C. Donzetta Matters, MD [facility] Office: [phone] Pager: [phone]

## 2019-10-16 ENCOUNTER — Encounter: Payer: Self-pay | Admitting: *Deleted

## 2019-10-16 ENCOUNTER — Inpatient Hospital Stay (HOSPITAL_COMMUNITY): Payer: Medicare Other

## 2019-10-16 LAB — BASIC METABOLIC PANEL
Anion gap: 11 (ref 5–15)
BUN: 20 mg/dL (ref 8–23)
CO2: 19 mmol/L — ABNORMAL LOW (ref 22–32)
Calcium: 8.9 mg/dL (ref 8.9–10.3)
Chloride: 109 mmol/L (ref 98–111)
Creatinine, Ser: 1.54 mg/dL — ABNORMAL HIGH (ref 0.61–1.24)
GFR calc Af Amer: 51 mL/min — ABNORMAL LOW (ref >60)
GFR calc non Af Amer: 44 mL/min — ABNORMAL LOW (ref >60)
Glucose, Bld: 111 mg/dL — ABNORMAL HIGH (ref 70–99)
Potassium: 4.2 mmol/L (ref 3.5–5.1)
Sodium: 139 mmol/L (ref 135–145)

## 2019-10-16 LAB — CBC
HCT: 32.4 % — ABNORMAL LOW (ref 39.0–52.0)
Hemoglobin: 10.5 g/dL — ABNORMAL LOW (ref 13.0–17.0)
MCH: 29.5 pg (ref 26.0–34.0)
MCHC: 32.4 g/dL (ref 30.0–36.0)
MCV: 91 fL (ref 80.0–100.0)
Platelets: 210 10*3/uL (ref 150–400)
RBC: 3.56 MIL/uL — ABNORMAL LOW (ref 4.22–5.81)
RDW: 14.1 % (ref 11.5–15.5)
WBC: 10.7 10*3/uL — ABNORMAL HIGH (ref 4.0–10.5)
nRBC: 0 % (ref 0.0–0.2)

## 2019-10-16 LAB — GLUCOSE, CAPILLARY
Glucose-Capillary: 107 mg/dL — ABNORMAL HIGH (ref 70–99)
Glucose-Capillary: 130 mg/dL — ABNORMAL HIGH (ref 70–99)
Glucose-Capillary: 125 mg/dL — ABNORMAL HIGH (ref 70–99)
Glucose-Capillary: 115 mg/dL — ABNORMAL HIGH (ref 70–99)

## 2019-10-16 MED ORDER — LIDOCAINE 5 % EX PTCH
1.0000 | MEDICATED_PATCH | CUTANEOUS | Status: DC
  Administered 2019-10-16 – 2019-10-24 (×7): 1 via TRANSDERMAL
  Filled 2019-10-16 (×8): qty 1

## 2019-10-16 MED ORDER — HYDROCHLOROTHIAZIDE 12.5 MG PO CAPS
12.5000 mg | ORAL_CAPSULE | Freq: Every day | ORAL | Status: DC
  Administered 2019-10-16: 12.5 mg via ORAL
  Filled 2019-10-16: qty 1

## 2019-10-16 MED ORDER — HEPARIN (PORCINE) 25000 UT/250ML-% IV SOLN
2150.0000 [IU]/h | INTRAVENOUS | Status: DC
  Administered 2019-10-16 – 2019-10-17 (×2): 2150 [IU]/h via INTRAVENOUS
  Filled 2019-10-16 (×2): qty 250

## 2019-10-16 MED ORDER — ACETAMINOPHEN 325 MG PO TABS
650.0000 mg | ORAL_TABLET | Freq: Four times a day (QID) | ORAL | Status: AC
  Administered 2019-10-16 – 2019-10-19 (×12): 650 mg via ORAL
  Filled 2019-10-16 (×12): qty 2

## 2019-10-16 NOTE — Progress Notes (Signed)
Rehab Admissions Coordinator Note:  Patient was screened by Cleatrice Burke for appropriateness for an Inpatient Acute Rehab Consult per PT recs.  At this time, we are recommending Inpatient Rehab consult and OT eval. Please place orders if you would like pt considered for admit.  Cleatrice Burke RN MSN 10/16/2019, 6:00 PM  I can be reached at [phone].

## 2019-10-16 NOTE — Evaluation (Signed)
Physical Therapy Evaluation Patient Details Name: [name] [name] MRN: [id number] DOB: [birth year] Today's Date: 10/16/2019   History of Present Illness  74 year old man with 10/10 lower chest and epigastric pain. Has been on/off for several weeks, with worst episode today. Has been complaining of back pain as well and is scheduled for spinal stenosis surgery but now states that now both the back and abdominal pains are simultaneous. 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  Clinical Impression  Pt presents to PT with deficits in functional mobility, gait, balance, endurance, strength, power, cognition, memory. Pt generally weak at this time, requiring physical assistance to perform all functional mobility. Pt reporting dizziness during upright activity, limiting gait progression at this time. Pt will benefit from aggressive mobilization and PT POC to improve balance and mobility quality. PT currently recommending CIR due to patient being independent prior to admission and requiring physical assistance to prevent falls with all mobility currently.    Follow Up Recommendations CIR;Supervision/Assistance - 24 hour    Equipment Recommendations  (defer to post-acute setting)    Recommendations for Other Services       Precautions / Restrictions Precautions Precautions: Fall Restrictions Weight Bearing Restrictions: No      Mobility  Bed Mobility Overal bed mobility: Needs Assistance Bed Mobility: Supine to Sit     Supine to sit: Mod assist        Transfers Overall transfer level: Needs assistance Equipment used: 1 [place] hand held assist Transfers: Sit to/from Stand Sit to Stand: Mod assist            Ambulation/Gait Ambulation/Gait assistance: Min assist Gait Distance (Feet): 3 Feet Assistive device: 1 [place] hand held assist Gait Pattern/deviations: Step-to  pattern;Shuffle Gait velocity: reduced Gait velocity interpretation: <1.31 ft/sec, indicative of household ambulator General Gait Details: turning from bed to recliner  Stairs            Wheelchair Mobility    Modified Rankin (Stroke Patients Only)       Balance Overall balance assessment: Needs assistance Sitting-balance support: Single extremity supported;Feet supported Sitting balance-Leahy Scale: Fair Sitting balance - Comments: minG   Standing balance support: Bilateral upper extremity supported Standing balance-Leahy Scale: Poor Standing balance comment: minA with BUE support of PT                             Pertinent Vitals/Pain Pain Assessment: Faces Faces Pain Scale: Hurts even more Pain Location: R shoulder and back Pain Descriptors / Indicators: Grimacing Pain Intervention(s): Limited activity within patient's tolerance    Home Living Family/patient expects to be discharged to:: Private residence Living Arrangements: Spouse/significant other Available Help at Discharge: Family;Available 24 hours/day(temporary) Type of Home: House Home Access: Level entry     Home Layout: Two level;Able to live on main level with bedroom/bathroom Home Equipment: Gilford Rile - 2 wheels;Cane - single point;Transport chair      Prior Function Level of Independence: Independent               Hand Dominance   Dominant Hand: Right    Extremity/Trunk Assessment   Upper Extremity Assessment Upper Extremity Assessment: Overall WFL for tasks assessed    Lower Extremity Assessment Lower Extremity Assessment: Generalized weakness(4/5 PF/DF, 3-/5 hip flexion, 3+/5 knee extension)    Cervical / Trunk Assessment Cervical / Trunk Assessment: Normal  Communication   Communication: Expressive difficulties  Cognition Arousal/Alertness: Awake/alert Behavior During Therapy: WFL for tasks assessed/performed Overall Cognitive Status: Impaired/Different from  baseline Area of Impairment: Orientation;Memory;Following commands;Safety/judgement;Problem solving;Awareness                 Orientation Level: Disoriented to;Time   Memory: Decreased recall of precautions;Decreased short-term memory Following Commands: Follows one step commands consistently Safety/Judgement: Decreased awareness of safety;Decreased awareness of deficits   Problem Solving: Slow processing;Requires verbal cues        General Comments General comments (skin integrity, edema, etc.): VSS on RA, pt is confused during session, spouse having to correct patient multiple times during history. Pt reports dizziness during changes of position, BP on arterial line does drop initially but recovers quickly to baseline    Exercises     Assessment/Plan    PT Assessment Patient needs continued PT services  PT Problem List Decreased strength;Decreased activity tolerance;Decreased balance;Decreased mobility;Decreased cognition;Decreased knowledge of use of DME;Decreased safety awareness;Decreased knowledge of precautions;Cardiopulmonary status limiting activity;Pain       PT Treatment Interventions DME instruction;Gait training;Stair training;Functional mobility training;Therapeutic activities;Therapeutic exercise;Balance training;Neuromuscular re-education;Patient/family education    PT Goals (Current goals can be found in the Care Plan section)  Acute Rehab PT Goals Patient Stated Goal: To return to baseline PT Goal Formulation: With patient Time For Goal Achievement: 10/30/19 Potential to Achieve Goals: Good Additional Goals Additional Goal #1: Pt will maintain dynamic standing balance within 10 inches of his base of support with UE support of the least restrictive assistive device and minG.    Frequency Min 3X/week   Barriers to discharge        Co-evaluation               AM-PAC PT "6 Clicks" Mobility  Outcome Measure Help needed turning from your back to  your side while in a flat bed without using bedrails?: A Little Help needed moving from lying on your back to sitting on the side of a flat bed without using bedrails?: A Lot Help needed moving to and from a bed to a chair (including a wheelchair)?: A Lot Help needed standing up from a chair using your arms (e.g., wheelchair or bedside chair)?: A Lot Help needed to walk in hospital room?: A Lot Help needed climbing 3-5 steps with a railing? : Total 6 Click Score: 12    End of Session   Activity Tolerance: Patient tolerated treatment well Patient left: in chair;with call bell/phone within reach Nurse Communication: Mobility status PT Visit Diagnosis: Unsteadiness on feet (R26.81);Muscle weakness (generalized) (M62.81)    Time: [id number] PT Time Calculation (min) (ACUTE ONLY): 37 min   Charges:   PT Evaluation $PT Eval Moderate Complexity: 1 Mod PT Treatments $Therapeutic Activity: 8-22 mins        Zenaida Niece, PT, DPT Acute Rehabilitation Pager: [phone]   Zenaida Niece 10/16/2019, 4:27 PM

## 2019-10-16 NOTE — Progress Notes (Signed)
NAME:  [name] [name], MRN:  [id number], DOB:  05/03/1946, LOS: 8 ADMISSION DATE:  10/08/2019, CONSULTATION DATE:  10/08/2019 REFERRING MD:  Langston Masker - [facility], CHIEF COMPLAINT:  Aortic dissection with hypertensive emergency.   HPI/course in hospital  74 year old man presented on 10/08/19 with 10/10 lower chest and epigastric pain. Had been on/off for several weeks, with worst episode on day of presentation. He was also complaining of back pain and was scheduled for spinal stenosis surgery.  Patient found to have Type B aortic dissection on CTA on 2/18 with aneurysmal dilation measuring up to approximately 4 cm.  Patient was seen by Vascular sx who recommended medical management, and patient was placed on beta blockers.  On repeat CTA on 2/22, however, the aneurysmal dilatation had increased to 5.9cm.  Patient had surgical repair on 2/25.  Past Medical History   has a past medical history of Chronic back pain, Enlarged prostate, GERD (gastroesophageal reflux disease), History of colon polyps, History of kidney stones, History of stress test, Hyperlipidemia, and Hypertension.   has a past surgical history that includes Tonsillectomy; fatty tissue removed from stomach; Colonoscopy; Esophagogastroduodenoscopy; right ankle surgery; Lumbar laminectomy/decompression microdiscectomy (N/A, 07/08/2013); and Anterior lat lumbar fusion (Left, 12/15/2014).    Interim history/subjective:  Hypertensive to the 200s this AM.  Patient denies headache, nausea.    Objective   Blood pressure (!) 118/59, pulse 70, temperature 99 F (37.2 C), temperature source Oral, resp. rate (!) 23, height 5\' 11"  (1.803 m), weight 89.7 kg, SpO2 96 %.        Intake/Output Summary (Last 24 hours) at 10/16/2019 0914 Last data filed at 10/16/2019 0800 Gross per 24 hour  Intake 2552.2 ml  Output 1731 ml  Net 821.2 ml   Filed Weights   10/13/19 0500 10/14/19 0500 10/16/19 0456  Weight: 88 kg 88.6 kg 89.7 kg    Examination (after  return from sx):  General:  elderly male, lying flat in bed, in NAD Neuro:  alert and oriented, answering questions, a little confused at times, oriented x3 HEENT:  trachea midline CV: RRR Pulm: no increased work of breathing on RA Abd: distended, tender to palpation Ext: Warm and dry MSK: bilateral dorsiflexion and plantar flexion 5/5 strength, able to lift both legs, but a little confused with directions  Ancillary tests (personally reviewed)  CBC: Recent Labs  Lab 10/13/19 0232 10/14/19 0344 10/15/19 0507 10/15/19 1215 10/16/19 0409  WBC 7.5 7.3 7.0 7.8 10.7*  HGB 11.0* 10.4* 10.4* 10.4* 10.5*  HCT 33.9* 32.4* 31.6* 32.6* 32.4*  MCV 90.4 92.0 91.1 93.1 91.0  PLT 196 225 233 234 [id number]    Basic Metabolic Panel: Recent Labs  Lab 10/13/19 0232 10/14/19 0831 10/15/19 0507 10/15/19 1215 10/16/19 0409  NA 139 136 136 139 139  K 4.1 4.0 3.7 4.5 4.2  CL 108 107 106 108 109  CO2 21* 18* 20* 20* 19*  GLUCOSE 99 116* 212* 154* 111*  BUN 17 19 19 18 20   CREATININE 1.49* 1.46* 1.60* 1.58* 1.54*  CALCIUM 8.9 9.0 8.9 8.7* 8.9  MG  --   --  2.1 2.0  --    GFR: Estimated Creatinine Clearance: 44.8 mL/min (A) (by C-G formula based on SCr of 1.54 mg/dL (H)). Recent Labs  Lab 10/14/19 0344 10/15/19 0507 10/15/19 1215 10/16/19 0409  WBC 7.3 7.0 7.8 10.7*    Liver Function Tests: No results for input(s): AST, ALT, ALKPHOS, BILITOT, PROT, ALBUMIN in the last 168 hours.  No results for input(s): LIPASE, AMYLASE in the last 168 hours. No results for input(s): AMMONIA in the last 168 hours.  ABG No results found for: PHART, PCO2ART, PO2ART, HCO3, TCO2, ACIDBASEDEF, O2SAT   Coagulation Profile: Recent Labs  Lab 10/15/19 0507 10/15/19 1215  INR 1.2 1.2    Cardiac Enzymes: No results for input(s): CKTOTAL, CKMB, CKMBINDEX, TROPONINI in the last 168 hours.  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 BP has been well controlled with other agents this admission  - Pain control: acetaminophen, gabapentin, PRN Morphine, PRN oxycodone; d/c fentanyl  Pulmonary embolism of CT angio on 2/18: Small incidental finding. Study was optimized for arterial phase and would be unusual for both conditions to coexist. LE Dopplers negative for DVT. - hold IV heparin until lumbar drain is removed  Abdominal Pain: Likely 2/2 ileus from opiate use.  XR shows slight gaseous distention of the proximal colon c/w and ileus. - miralax prn - bisacodyl prn  BPH:  - Flomax  Chronic Back Pain: - gabapentin - lidocaine patch  AKI: Improving  - Daily BMP  Type 2 diabetes on oral agents - hold home metformin - SSI  Dyslipidemia - Continue statin  Daily Goals Checklist  Pain/Anxiety/Delirium protocol (if indicated): pain regimen as above VAP protocol (if indicated): not intubated Blood pressure target: keep SBP 120-140, MAP >70 DVT prophylaxis: to restart heparin after lumbar drain removed GI prophylaxis: protonix Fluid status goals: gentle IVF Code Status: Full code Family Communication: patient and wife updated at bedside 2/26 Disposition: ICU  CRITICAL CARE Performed by: Mallie Darting   Total critical care time: 35 minutes  Critical  care time was exclusive of separately billable procedures and treating other patients. Critical care was necessary to treat or prevent imminent or life-threatening deterioration.  Critical care was time spent personally by me on the following activities: development of treatment plan with patient and/or surrogate as well as nursing, discussions with consultants, evaluation of patient's response to treatment, examination of patient, obtaining history from patient or surrogate, ordering and performing treatments and interventions, ordering and review of laboratory studies, ordering and review of radiographic studies, pulse oximetry and re-evaluation of patient's condition.  Mallie Darting, MS4

## 2019-10-16 NOTE — Addendum Note (Signed)
Addendum  created 10/16/19 1251 by Roberts Gaudy, MD   Clinical Note Signed

## 2019-10-16 NOTE — Progress Notes (Addendum)
  Progress Note    10/16/2019 11:28 AM 1 Day Post-Op  Subjective:  Says he doesn't feel too good  Tm 99.4  HR 50's-70's NSR [id number] systolic 0000000 RA  Vitals:   10/16/19 0645 10/16/19 0800  BP:    Pulse: 70   Resp: (!) 23   Temp: 99.4 F (37.4 C) 99 F (37.2 C)  SpO2: 96%     Physical Exam: Cardiac:  regular Lungs:  Non labored Incisions:  Right groin is soft without hematoma Extremities:  Bilateral DP/PT and bilateral radial pulses are easily palpable; motor and sensory are in tact. Neuro:  Moving all extremities equally. Abdomen:  Soft, NT/ND  CBC    Component Value Date/Time   WBC 10.7 (H) 10/16/2019 0409   RBC 3.56 (L) 10/16/2019 0409   HGB 10.5 (L) 10/16/2019 0409   HCT 32.4 (L) 10/16/2019 0409   PLT 210 10/16/2019 0409   MCV 91.0 10/16/2019 0409   MCH 29.5 10/16/2019 0409   MCHC 32.4 10/16/2019 0409   RDW 14.1 10/16/2019 0409   LYMPHSABS 1.7 12/10/2014 1106   MONOABS 0.6 12/10/2014 1106   EOSABS 0.2 12/10/2014 1106   BASOSABS 0.0 12/10/2014 1106    BMET    Component Value Date/Time   NA 139 10/16/2019 0409   K 4.2 10/16/2019 0409   CL 109 10/16/2019 0409   CO2 19 (L) 10/16/2019 0409   GLUCOSE 111 (H) 10/16/2019 0409   BUN 20 10/16/2019 0409   CREATININE 1.54 (H) 10/16/2019 0409   CALCIUM 8.9 10/16/2019 0409   GFRNONAA 44 (L) 10/16/2019 0409   GFRAA 51 (L) 10/16/2019 0409    INR    Component Value Date/Time   INR 1.2 10/15/2019 1215     Intake/Output Summary (Last 24 hours) at 10/16/2019 1128 Last data filed at 10/16/2019 1000 Gross per 24 hour  Intake 2552.2 ml  Output 1881 ml  Net 671.2 ml     Assessment:  74 y.o. male is s/p:  1.  Percutaneous right common femoral access and closure 2.  Intravascular ultrasound thoracic and abdominal aorta 3.  Thoracic endograft with proximal and distal 37 x 20 cm Gore conformable thoracic stent graft  1 Day Post-Op  Plan: -pedal pulses and radial pulses are all palpable -neuro in  tact -hypertensive- labetalol infusion started  -lumbar drain management per anesthesia.  -DVT prophylaxis:  Will discuss with Dr. Donzetta Matters when to restart heparin after lumbar drain removed.   Addendum:  Discussed with Dr. Donzetta Matters and recommend restarting heparin at least 6 hours after the drain is removed.   Leontine Locket, PA-C [facility] [phone] 10/16/2019 11:28 AM  I have independently interviewed and examined patient and agree with PA assessment and plan above.   Kileen Lange C. Donzetta Matters, MD [facility] Office: [phone] Pager: [phone]

## 2019-10-16 NOTE — Progress Notes (Signed)
Anesthesiology Follow-up:  Spinal catheter drained approximately 10 cc/hr last night, now with minimal drainage, normal strength and sensation in LEs. Catheter d/ced, site OK tip intact, plan to restart IV heparin at 6:30 pm today.  Roberts Gaudy, MD

## 2019-10-16 NOTE — Progress Notes (Signed)
[place] for  heparin  Indication: PE  Allergies  Allergen Reactions  . Lyrica [Pregabalin] Nausea Only and Other (See Comments)    Hallucinations and dizziness, also  . Sulfa Antibiotics Other (See Comments)    Reaction not recalled     Patient Measurements: Height: 5\' 11"  (180.3 cm) Weight: 197 lb 12 oz (89.7 kg) IBW/kg (Calculated) : 75.3 Heparin Dosing Weight: 89.4 kg  Vital Signs: Temp: 99.3 F (37.4 C) (02/26 1200) Temp Source: Oral (02/26 1200) Pulse Rate: 62 (02/26 1245)  Labs: Recent Labs    10/13/19 2135 10/13/19 2321 10/13/19 2322 10/14/19 0344 10/14/19 0344 10/14/19 0515 10/14/19 0831 10/14/19 1638 10/15/19 0507 10/15/19 0507 10/15/19 1215 10/16/19 0409  HGB  --   --   --  10.4*   < >  --   --   --  10.4*   < > 10.4* 10.5*  HCT  --   --   --  32.4*   < >  --   --   --  31.6*  --  32.6* 32.4*  PLT  --   --   --  225   < >  --   --   --  233  --  234 210  APTT 76*  --    < >  --   --  53*  --  60*  --   --  40*  --   LABPROT  --   --   --   --   --   --   --   --  15.3*  --  15.3*  --   INR  --   --   --   --   --   --   --   --  1.2  --  1.2  --   HEPARINUNFRC >2.20* 1.66*  --  1.74*  --   --   --   --   --   --   --   --   CREATININE  --   --   --   --    < >  --    < >  --  1.60*  --  1.58* 1.54*   < > = values in this interval not displayed.    Estimated Creatinine Clearance: 44.8 mL/min (A) (by C-G formula based on SCr of 1.54 mg/dL (H)).  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 Patient is not taking anticoagulation PTA. She was started on apixaban (last apixaban dose was 2/22 @ 9:30pm) but this was stopped for procedure  He is now s/p procedure and lumbar drain was removed. Per MD note, heparin to restart at 6:30pm   Goal of Therapy:  Heparin level 0.3-0.5 APTT  66-85 Monitor platelets by anticoagulation protocol: Yes   Plan:  -Restart heparin at 2150 units/hr -Heparin level in 8 hours and daily wth CBC daily  Hildred Laser, PharmD Clinical Pharmacist **Pharmacist phone directory can now be found on [email or web address] ([email or web address]).  Listed under [place].

## 2019-10-17 ENCOUNTER — Inpatient Hospital Stay (HOSPITAL_COMMUNITY): Payer: Medicare Other

## 2019-10-17 DIAGNOSIS — I161 Hypertensive emergency: Secondary | ICD-10-CM

## 2019-10-17 DIAGNOSIS — I6529 Occlusion and stenosis of unspecified carotid artery: Secondary | ICD-10-CM

## 2019-10-17 LAB — CBC
HCT: 29.8 % — ABNORMAL LOW (ref 39.0–52.0)
Hemoglobin: 9.5 g/dL — ABNORMAL LOW (ref 13.0–17.0)
MCH: 29.4 pg (ref 26.0–34.0)
MCHC: 31.9 g/dL (ref 30.0–36.0)
MCV: 92.3 fL (ref 80.0–100.0)
Platelets: 190 10*3/uL (ref 150–400)
RBC: 3.23 MIL/uL — ABNORMAL LOW (ref 4.22–5.81)
RDW: 14.7 % (ref 11.5–15.5)
WBC: 11.6 10*3/uL — ABNORMAL HIGH (ref 4.0–10.5)
nRBC: 0 % (ref 0.0–0.2)

## 2019-10-17 LAB — GLUCOSE, CAPILLARY
Glucose-Capillary: 123 mg/dL — ABNORMAL HIGH (ref 70–99)
Glucose-Capillary: 151 mg/dL — ABNORMAL HIGH (ref 70–99)
Glucose-Capillary: 146 mg/dL — ABNORMAL HIGH (ref 70–99)
Glucose-Capillary: 121 mg/dL — ABNORMAL HIGH (ref 70–99)

## 2019-10-17 LAB — HEPARIN LEVEL (UNFRACTIONATED)
Heparin Unfractionated: 0.7 IU/mL (ref 0.30–0.70)
Heparin Unfractionated: 0.63 IU/mL (ref 0.30–0.70)

## 2019-10-17 LAB — APTT
aPTT: 197 seconds (ref 24–36)
aPTT: 134 seconds — ABNORMAL HIGH (ref 24–36)

## 2019-10-17 LAB — AMMONIA: Ammonia: 14 umol/L (ref 9–35)

## 2019-10-17 MED ORDER — HEPARIN (PORCINE) 25000 UT/250ML-% IV SOLN
1950.0000 [IU]/h | INTRAVENOUS | Status: DC
  Filled 2019-10-17: qty 250

## 2019-10-17 MED ORDER — BISACODYL 5 MG PO TBEC
10.0000 mg | DELAYED_RELEASE_TABLET | Freq: Every day | ORAL | Status: DC | PRN
  Administered 2019-10-17 – 2019-10-18 (×2): 10 mg via ORAL
  Filled 2019-10-17 (×2): qty 2

## 2019-10-17 MED ORDER — HEPARIN (PORCINE) 25000 UT/250ML-% IV SOLN
1800.0000 [IU]/h | INTRAVENOUS | Status: DC
  Administered 2019-10-17 – 2019-10-20 (×5): 1800 [IU]/h via INTRAVENOUS
  Filled 2019-10-17 (×4): qty 250

## 2019-10-17 MED ORDER — HYDROCHLOROTHIAZIDE 25 MG PO TABS
25.0000 mg | ORAL_TABLET | Freq: Every day | ORAL | Status: DC
  Administered 2019-10-17 – 2019-10-18 (×2): 25 mg via ORAL
  Filled 2019-10-17 (×2): qty 1

## 2019-10-17 NOTE — Progress Notes (Signed)
[facility] for  heparin  Indication: PE  Allergies  Allergen Reactions  . Lyrica [Pregabalin] Nausea Only and Other (See Comments)    Hallucinations and dizziness, also  . Sulfa Antibiotics Other (See Comments)    Reaction not recalled     Patient Measurements: Height: 5\' 11"  (180.3 cm) Weight: 202 lb 13.2 oz (92 kg) IBW/kg (Calculated) : 75.3 Heparin Dosing Weight: 89.4 kg  Vital Signs: Temp: 98.7 F (37.1 C) (02/27 1630) Temp Source: Oral (02/27 1630) BP: 122/66 (02/27 1900) Pulse Rate: 56 (02/27 1900)  Labs: Recent Labs    10/15/19 0507 10/15/19 0507 10/15/19 1215 10/15/19 1215 10/16/19 0409 10/17/19 0405 10/17/19 0505 10/17/19 0755 10/17/19 1749  HGB 10.4*   < > 10.4*   < > 10.5* 9.5*  --   --   --   HCT 31.6*   < > 32.6*  --  32.4* 29.8*  --   --   --   PLT 233   < > 234  --  210 190  --   --   --   APTT  --   --  40*  --   --   --   --  197* 134*  LABPROT 15.3*  --  15.3*  --   --   --   --   --   --   INR 1.2  --  1.2  --   --   --   --   --   --   HEPARINUNFRC  --   --   --   --   --   --  0.70  --  0.63  CREATININE 1.60*  --  1.58*  --  1.54*  --   --   --   --    < > = values in this interval not displayed.    Estimated Creatinine Clearance: 48.8 mL/min (A) (by C-G formula based on SCr of 1.54 mg/dL (H)).  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 Patient is not taking anticoagulation PTA. He was started on apixaban (last apixaban dose was 2/22 @ 9:30pm) but this was stopped for procedure  He is now s/p procedure and lumbar drain was removed. Heparin level this evening still elevated at 0.63, aPTT also remains elevated at 134. Hgb 9.5, plt 190. Level drawn correctly from A-line. No bleeding or infusion issues per RN.   Goal of Therapy:  Heparin level 0.3-0.5 APTT 66-85 Monitor platelets  by anticoagulation protocol: Yes   Plan:  -Hold heparin x 1 hour then restart heparin at 1800 units/hr -Heparin level and aPTT in 8 hours and daily with CBC daily -Monitor for bleeding  Antonietta Jewel, PharmD, BCCCP Clinical Pharmacist  Phone: [phone]  Please check AMION for all [facility] phone numbers After 10:00 PM, call [place] [phone] 10/17/2019       7:08 PM

## 2019-10-17 NOTE — Progress Notes (Signed)
NAME:  [name] [name], MRN:  [id number], DOB:  03-Mar-1946, LOS: 9 ADMISSION DATE:  10/08/2019, CONSULTATION DATE:  10/08/2019 REFERRING MD:  Langston Masker - [facility], CHIEF COMPLAINT:  Aortic dissection with hypertensive emergency.   HPI/course in hospital  74 year old man presented on 10/08/19 with 10/10 lower chest and epigastric pain. Had been on/off for several weeks, with worst episode on day of presentation. He was also complaining of back pain and was scheduled for spinal stenosis surgery.  Patient found to have Type B aortic dissection on CTA on 2/18 with aneurysmal dilation measuring up to approximately 4 cm.  Patient was seen by Vascular sx who recommended medical management, and patient was placed on beta blockers.  On repeat CTA on 2/22, however, the aneurysmal dilatation had increased to 5.9cm.  Patient had surgical repair on 2/25.  Past Medical History   has a past medical history of Chronic back pain, Enlarged prostate, GERD (gastroesophageal reflux disease), History of colon polyps, History of kidney stones, History of stress test, Hyperlipidemia, and Hypertension.   has a past surgical history that includes Tonsillectomy; fatty tissue removed from stomach; Colonoscopy; Esophagogastroduodenoscopy; right ankle surgery; Lumbar laminectomy/decompression microdiscectomy (N/A, 07/08/2013); Anterior lat lumbar fusion (Left, 12/15/2014); and Thoracic aortic endovascular stent graft (N/A, 10/15/2019).    Interim history/subjective:  Spinal catheter removed yesterday. Remains on labetalol. Oriented x 4 this morning however RN reports episodes of confusion yesterday   Objective   Blood pressure 111/61, pulse (!) 59, temperature 99.2 F (37.3 C), temperature source Oral, resp. rate 20, height 5\' 11"  (1.803 m), weight 92 kg, SpO2 96 %.        Intake/Output Summary (Last 24 hours) at 10/17/2019 0856 Last data filed at 10/17/2019 0522 Gross per 24 hour  Intake 2552.32 ml  Output 553 ml  Net 1999.32 ml     Filed Weights   10/14/19 0500 10/16/19 0456 10/17/19 0506  Weight: 88.6 kg 89.7 kg 92 kg   Physical Exam: General: Elderly appearing, no acute distress, drowsy but awakens to conversation HENT: [place], AT, OP clear, MMM Eyes: EOMI, no scleral icterus Respiratory: Clear to auscultation bilaterally.  No crackles, wheezing or rales Cardiovascular: RRR, -M/R/G, no JVD GI: BS+, soft, nontender Extremities:-Edema,-tenderness Neuro: AAO x4, CNII-XII grossly intact, follows commands Skin: Intact, no rashes or bruising Psych: Normal mood, normal affect  Ancillary tests (personally reviewed)  CBC: Recent Labs  Lab 10/14/19 0344 10/15/19 0507 10/15/19 1215 10/16/19 0409 10/17/19 0405  WBC 7.3 7.0 7.8 10.7* 11.6*  HGB 10.4* 10.4* 10.4* 10.5* 9.5*  HCT 32.4* 31.6* 32.6* 32.4* 29.8*  MCV 92.0 91.1 93.1 91.0 92.3  PLT 225 233 234 210 [id number]    Basic Metabolic Panel: Recent Labs  Lab 10/13/19 0232 10/14/19 0831 10/15/19 0507 10/15/19 1215 10/16/19 0409  NA 139 136 136 139 139  K 4.1 4.0 3.7 4.5 4.2  CL 108 107 106 108 109  CO2 21* 18* 20* 20* 19*  GLUCOSE 99 116* 212* 154* 111*  BUN 17 19 19 18 20   CREATININE 1.49* 1.46* 1.60* 1.58* 1.54*  CALCIUM 8.9 9.0 8.9 8.7* 8.9  MG  --   --  2.1 2.0  --    GFR: Estimated Creatinine Clearance: 48.8 mL/min (A) (by C-G formula based on SCr of 1.54 mg/dL (H)). Recent Labs  Lab 10/15/19 0507 10/15/19 1215 10/16/19 0409 10/17/19 0405  WBC 7.0 7.8 10.7* 11.6*    Liver Function Tests: No results for input(s): AST, ALT, ALKPHOS, BILITOT, PROT, ALBUMIN  in the last 168 hours. No results for input(s): LIPASE, AMYLASE in the last 168 hours. No results for input(s): AMMONIA in the last 168 hours.  ABG No results found for: PHART, PCO2ART, PO2ART, HCO3, TCO2, ACIDBASEDEF, O2SAT   Coagulation Profile: Recent Labs  Lab 10/15/19 0507 10/15/19 1215  INR 1.2 1.2    Cardiac Enzymes: No results for input(s): CKTOTAL, CKMB, CKMBINDEX,  TROPONINI in the last 168 hours.  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 Increased HCTZ - Pain control: acetaminophen, gabapentin, PRN Morphine, PRN oxycodone; d/c fentanyl - CT head today for intermittent confusion  Pulmonary embolism of CT angio on 2/18: Small incidental finding. Study was optimized for arterial phase and would be unusual for both conditions to coexist. LE Dopplers negative for DVT. - Started on heparin gtt overnight after lumbar drain remove  Abdominal Pain: Likely 2/2 ileus from opiate use.  XR shows slight gaseous distention of the proximal colon c/w and ileus. - miralax prn - bisacodyl prn  BPH:  - Flomax  Chronic Back Pain: - gabapentin - lidocaine patch  AKI: Improving  - Daily BMP  Type 2 diabetes on oral agents - hold home metformin - SSI  Dyslipidemia - Continue statin  Daily Goals Checklist  Pain/Anxiety/Delirium protocol (if indicated): pain regimen as above VAP protocol (if indicated): not intubated Blood pressure target: keep SBP 120-140, MAP >70 DVT prophylaxis: Heparin gtt GI prophylaxis: protonix Code Status: Full code Family Communication: Updated patient at bedside on 2/27 Disposition: ICU  The patient is critically ill with multiple organ systems failure and requires high complexity decision making for assessment and  support, frequent evaluation and titration of therapies, application of advanced monitoring technologies and extensive interpretation of multiple databases.   Critical Care Time devoted to patient care services described in this note is 31 Minutes.  Rodman Pickle, M.D. [facility]monary/Critical Care Medicine 10/17/2019 8:57 AM   Please see Amion for pager number to reach on-call Pulmonary and Critical Care Team.

## 2019-10-17 NOTE — Progress Notes (Signed)
[place] for  heparin  Indication: PE  Allergies  Allergen Reactions  . Lyrica [Pregabalin] Nausea Only and Other (See Comments)    Hallucinations and dizziness, also  . Sulfa Antibiotics Other (See Comments)    Reaction not recalled     Patient Measurements: Height: 5\' 11"  (180.3 cm) Weight: 202 lb 13.2 oz (92 kg) IBW/kg (Calculated) : 75.3 Heparin Dosing Weight: 89.4 kg  Vital Signs: Temp: 99.2 F (37.3 C) (02/27 0640) Temp Source: Oral (02/27 0640) BP: 111/61 (02/27 0600) Pulse Rate: 59 (02/27 0630)  Labs: Recent Labs    10/14/19 1638 10/15/19 0507 10/15/19 0507 10/15/19 1215 10/15/19 1215 10/16/19 0409 10/17/19 0405 10/17/19 0505 10/17/19 0755  HGB  --  10.4*   < > 10.4*   < > 10.5* 9.5*  --   --   HCT  --  31.6*   < > 32.6*  --  32.4* 29.8*  --   --   PLT  --  233   < > 234  --  210 190  --   --   APTT 60*  --   --  40*  --   --   --   --  197*  LABPROT  --  15.3*  --  15.3*  --   --   --   --   --   INR  --  1.2  --  1.2  --   --   --   --   --   HEPARINUNFRC  --   --   --   --   --   --   --  0.70  --   CREATININE  --  1.60*  --  1.58*  --  1.54*  --   --   --    < > = values in this interval not displayed.    Estimated Creatinine Clearance: 48.8 mL/min (A) (by C-G formula based on SCr of 1.54 mg/dL (H)).  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 Patient is not taking anticoagulation PTA. He was started on apixaban (last apixaban dose was 2/22 @ 9:30pm) but this was stopped for procedure  He is now s/p procedure and lumbar drain was removed. Heparin level this AM above goal at 0.7. aPTT ordered to confirm coagulation status as pt has somewhat recent apixaban use that could falsely elevate heparin levels. aPTT resulted at 196. RN confirmed collected correctly from A-line.   Goal of Therapy:   Heparin level 0.3-0.5 APTT 66-85 Monitor platelets by anticoagulation protocol: Yes   Plan:  -Hold heparin x 1 hour then restart heparin at 1950 units/hr -Heparin level and aPTT in 8 hours and daily wth CBC daily -Monitor for bleeding  Vertis Kelch, PharmD, [facility] PGY2 Cardiology Pharmacy Resident Phone [phone] 10/17/2019       9:09 AM  Please check [email or web address] for unit-specific pharmacist phone numbers

## 2019-10-17 NOTE — Plan of Care (Signed)
  Problem: Education: Goal: Knowledge of General Education information will improve Description: Including pain rating scale, medication(s)/side effects and non-pharmacologic comfort measures Outcome: Progressing   Problem: Clinical Measurements: Goal: Ability to maintain clinical measurements within normal limits will improve Outcome: Progressing Goal: Respiratory complications will improve Outcome: Progressing Goal: Cardiovascular complication will be avoided Outcome: Progressing   Problem: Nutrition: Goal: Adequate nutrition will be maintained Outcome: Progressing   Problem: Pain Managment: Goal: General experience of comfort will improve Outcome: Progressing

## 2019-10-17 NOTE — Consult Note (Addendum)
NEURO HOSPITALIST  CONSULT   Requesting Physician: Dr. Loanne Drilling    Chief Complaint: AMS/ weakness  History obtained from:  Chart/ wife/ patient HPI:                                                                                                                                         [name] [name] is an 74 y.o. male  With PMH significant for HTN, HLD, distal thoracic aneurysm ( repaired 10/15/19) who presented to [facility] with c/o epigastric pain and back pain as well as some bilateral leg weakness. Neurology consulted 2/27 for AMS and weakness.    Per nursing and wife patient has been having intermittent confusion.  RN and wife have also noticed some weakness as well as increasing lethargy. Patient initially admitted for type B aortic aneurysm to be managed medically, but was taken to OR on 2/25.  Hospital course course:  2/18: admitted for HTN emergency d/t Type distal thoracic aortic aneurysm. Small PE 2/25 surgical repair of type B  Thoracic aortic aneurysm  2/27: CTH: hypodensities in right cerebellum and right occipital lobe suspicious for acute to subacute infarct. Remains on labetalol infusion to control BP's. MRI: bilateral cerebellar and cerebral infarcts  NIHSS: 1 Last known well-prior to admission MRIs prior to admission-2-due to chronic back pain  Past Medical History:  Diagnosis Date  . Chronic back pain    stenosis  . Enlarged prostate   . GERD (gastroesophageal reflux disease)    occasionally will take a zantac(maybe once a month)  . History of colon polyps   . History of kidney stones   . History of stress test    done 10 yrs. ago, as a baseline   . Hyperlipidemia    takes Crestor daily  . Hypertension    takes Amlodipine daily and Lotensin as well    Past Surgical History:  Procedure Laterality Date  . ANTERIOR LAT LUMBAR FUSION Left 12/15/2014   Procedure: ANTERIOR LATERAL LUMBAR FUSION 1 LEVEL;  Surgeon: Phylliss Bob, MD;  Location: [place];  Service: Orthopedics;  Laterality: Left;  Left sided lateral lumbar interbody fusion, lumbar 3-4, posterior spinal fusion, lumbar 3-4 with instrumentation.  . COLONOSCOPY    . ESOPHAGOGASTRODUODENOSCOPY    . fatty tissue removed from stomach    . LUMBAR LAMINECTOMY/DECOMPRESSION MICRODISCECTOMY N/A 07/08/2013   Procedure: LUMBAR LAMINECTOMY/DECOMPRESSION MICRODISCECTOMY;  Surgeon: Sinclair Ship, MD;  Location: [place];  Service: Orthopedics;  Laterality: N/A;  Lumbar 3-4, lumbar 4-5 decompression  . right ankle surgery     as child   . THORACIC AORTIC ENDOVASCULAR STENT GRAFT N/A 10/15/2019  Procedure: THORACIC AORTIC ENDOVASCULAR STENT GRAFT;  Surgeon: Waynetta Sandy, MD;  Location: [place];  Service: Vascular;  Laterality: N/A;  . TONSILLECTOMY     as a child    History reviewed. No pertinent family history.       Social History:  reports that he has quit smoking. His smoking use included cigarettes. He has a 10.00 pack-year smoking history. He has never used smokeless tobacco. He reports that he does not drink alcohol or use drugs.  Allergies:  Allergies  Allergen Reactions  . Lyrica [Pregabalin] Nausea Only and Other (See Comments)    Hallucinations and dizziness, also  . Sulfa Antibiotics Other (See Comments)    Reaction not recalled     Medications:                                                                                                                           Scheduled: . acetaminophen  650 mg Oral Q6H  . amLODipine  10 mg Oral Daily  . atorvastatin  10 mg Oral q1800  . carvedilol  25 mg Oral BID WC  . Chlorhexidine Gluconate Cloth  6 each Topical Daily  . gabapentin  300 mg Oral QHS  . hydrALAZINE  50 mg Oral Q6H  . hydrochlorothiazide  25 mg Oral Daily  . influenza vaccine adjuvanted  0.5 mL Intramuscular Tomorrow-1000  . insulin aspart  0-15 Units Subcutaneous TID WC  . insulin aspart  0-5 Units Subcutaneous QHS   . lidocaine  1 patch Transdermal Q24H  . mupirocin ointment  1 application Nasal BID  . pantoprazole  40 mg Oral Daily  . pneumococcal 23 valent vaccine  0.5 mL Intramuscular Tomorrow-1000  . sodium chloride flush  10-40 mL Intracatheter Q12H  . sodium chloride flush  3 mL Intravenous Once  . sodium chloride flush  3 mL Intravenous Q12H  . tamsulosin  0.4 mg Oral Daily   Continuous: . sodium chloride    . sodium chloride 75 mL/hr at 10/17/19 1108  . heparin 1,950 Units/hr (10/17/19 1100)  . labetalol (NORMODYNE) infusion 3 mg/min (10/17/19 1100)  . magnesium sulfate bolus IVPB     [id number] chloride, alum & mag hydroxide-simeth, bisacodyl, guaiFENesin-dextromethorphan, hydrALAZINE, magnesium sulfate bolus IVPB, metoprolol tartrate, morphine injection, ondansetron (ZOFRAN) IV, oxyCODONE-acetaminophen, phenol, polyethylene glycol, potassium chloride, sodium chloride flush, sodium chloride flush   ROS:  ROS was performed and is negative except as noted in HPI  General Examination:                                                                                                      Blood pressure 113/66, pulse 66, temperature 100.2 F (37.9 C), temperature source Oral, resp. rate (!) 29, height 5\' 11"  (1.803 m), weight 92 kg, SpO2 98 %.  Physical Exam  Constitutional: Appears well-developed and well-nourished.  Psych: Affect appropriate to situation Eyes: Normal external eye and conjunctiva. HENT: Normocephalic, no lesions, without obvious abnormality.   Musculoskeletal-no joint tenderness, deformity or swelling Cardiovascular: Normal rate and regular rhythm.  Respiratory: Effort normal, non-labored breathing saturations WNL GI: Soft.  No distension. There is no tenderness.  Skin: WDI  Neurological Examination Mental Status: Alert, oriented, thought  content appropriate. Speech fluent without evidence of aphasia.  Able to follow commands without difficulty.  Has mild dysarthria.  Became very emotional and said he feels like he is crying but does not know why. Cranial Nerves: Pupils equal round react to light, extraocular movements intact but he has diplopia in all fields of gaze, diplopia does not resolve after covering the right or the left eye, there is no restriction of extraocular movements, face appears symmetric, auditory acuity intact, tongue midline. Motor: Right : Upper extremity   5/5 Left:     Upper extremity   5/5 5/5 plantar flexion bilaterally 4/5 dorsiflexion bilaterally 4/5 knee extension bilaterally  Mild asterixis noted on outstretched hands. Tone and bulk:normal tone throughout; no atrophy noted Sensory:  light touch intact throughout, bilaterally Cerebellar: No ataxia noted Gait: deferred   Lab Results: Basic Metabolic Panel: Recent Labs  Lab 10/13/19 0232 10/13/19 0232 10/14/19 0831 10/14/19 0831 10/15/19 0507 10/15/19 1215 10/16/19 0409  NA 139  --  136  --  136 139 139  K 4.1  --  4.0  --  3.7 4.5 4.2  CL 108  --  107  --  106 108 109  CO2 21*  --  18*  --  20* 20* 19*  GLUCOSE 99  --  116*  --  212* 154* 111*  BUN 17  --  19  --  19 18 20   CREATININE 1.49*  --  1.46*  --  1.60* 1.58* 1.54*  CALCIUM 8.9   < > 9.0   < > 8.9 8.7* 8.9  MG  --   --   --   --  2.1 2.0  --    < > = values in this interval not displayed.    CBC: Recent Labs  Lab 10/14/19 0344 10/15/19 0507 10/15/19 1215 10/16/19 0409 10/17/19 0405  WBC 7.3 7.0 7.8 10.7* 11.6*  HGB 10.4* 10.4* 10.4* 10.5* 9.5*  HCT 32.4* 31.6* 32.6* 32.4* 29.8*  MCV 92.0 91.1 93.1 91.0 92.3  PLT 225 233 234 210 190    CBG: Recent Labs  Lab 10/16/19 1139 10/16/19 1546 10/16/19 2150 10/17/19 0641 10/17/19 1240  GLUCAP 130* 125* 115* 123* 151*    Imaging: DG Abd 1  View  Result Date: 10/16/2019 CLINICAL DATA:  Ileus. EXAM: ABDOMEN - 1  VIEW COMPARISON:  CT scan dated 10/13/2019 FINDINGS: There is slight gaseous distention of the ascending and transverse portions of the colon with air scattered throughout the small bowel and nondistended distal colon. There is air in a small bowel loop in the right inguinal hernia. No evidence of free air or free fluid on the supine radiograph. No acute bone abnormality. IMPRESSION: Slight gaseous distention of the proximal colon consistent with an ileus. Electronically Signed   By: Lorriane Shire M.D.   On: 10/16/2019 11:55   CT HEAD WO CONTRAST  Result Date: 10/17/2019 CLINICAL DATA:  Altered mental status with unclear cause EXAM: CT HEAD WITHOUT CONTRAST TECHNIQUE: Contiguous axial images were obtained from the base of the skull through the vertex without intravenous contrast. COMPARISON:  MRI of the brain from 2011 FINDINGS: Brain: Signs of atrophy and chronic microvascular ischemic change. No signs of hydrocephalus, midline shift, mass effect or intracranial hemorrhage. Subtle area of gray-white differentiation loss in the posterior right occipital lobe extending from the white matter to cortex, not associated with mass effect Subtle hypodensity also in the right cerebellum, inferiorly in the inferior cerebellar peduncle. Vascular: No hyperdense vessel or unexpected calcification. Skull: Normal. Negative for fracture or focal lesion. Sinuses/Orbits: Visualized paranasal sinuses and orbits are unremarkable. Other: None IMPRESSION: Subtle area of gray-white differentiation loss in the posterior right occipital lobe extending from the white matter to cortex, not associated with mass effect. Subtle hypodensity also in the right cerebellum, inferiorly in the inferior cerebellar peduncle. Findings are suspicious for acute to subacute infarct, in particular the area in the right occipital lobe. Signs of generalized atrophy and chronic white matter disease. These results were called by telephone at the time of  interpretation on 10/17/2019 at 12:17 pm to provider Dr. Loanne Drilling , who verbally acknowledged these results. Electronically Signed   By: Zetta Bills M.D.   On: 10/17/2019 12:18       Laurey Morale, MSN, NP-C [facility] [phone]  10/17/2019, 1:13 PM   Attending physician note to follow with Assessment and plan .  Attending addendum Patient seen and examined at the request of PCCM attending Dr. Ebony Hail for confusion and possible left arm weakness. Patient reports slurred speech and off-and-on weakness in both the arms. Imaging reviews scattered embolic-looking strokes-likely secondary/related to the aortic aneurysm. We will recommend completion of stroke work-up.  Assessment: 74 y.o. male with PMH HTN, HLD s/p aortic aneurysm repair with  Intermittent AMS and weakness.  Also found to have pulmonary embolism-on heparin. MRI revealed bilateral cerebral and cerebellar infarcts-likely embolic etiology from the aneurysm but does need complete stroke work-up to ensure other causes are not missed. Exam consistent with no cranial nerve deficits, mild dysarthria, generalized weakness and emotional lability. Might have a component of pseudobulbar affect as well. 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 Tremor Other Hyperkinet Mov (N Y). 2013;3:[id number]. Published 2013 Dec 11. 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   Recommendations: --  agree with goal SBP 120-140 --ECHO --CTA head and neck --PT/OT/ speech --Lipid panel, HgbA1c, ammonia --continue Heparin for PE-does have risk for hemorrhagic conversion but strokes are probably now a few days old and has not shown any evidence of bleeding yet and needs it for PE. --High intensity statin if LDL > 70 --Frequent neurochecks  Stroke team will follow with you.  -- Amie Portland, MD [facility] Pager: [phone] If 7pm to 7am, please call on call as listed on AMION.

## 2019-10-17 NOTE — Progress Notes (Signed)
VASCULAR LAB PRELIMINARY  PRELIMINARY  PRELIMINARY  PRELIMINARY  Carotid duplex completed.    Preliminary report:  See CV proc for preliminary results.   Sael Furches, RVT 10/17/2019, 5:43 PM

## 2019-10-17 NOTE — Progress Notes (Addendum)
  Progress Note    10/17/2019 9:20 AM 2 Days Post-Op  Subjective: Does not have any acute complaints Patient seems increasingly confused overnight  Vitals:   10/17/19 0630 10/17/19 0640  BP:    Pulse: (!) 59   Resp: 20   Temp:  99.2 F (37.3 C)  SpO2: 96%     Physical Exam: He is oriented to [place] place and time Nonlabored respirations Abdomen is soft with mild distention Right groin incision clean dry intact Palpable PT pulses bilaterally  CBC    Component Value Date/Time   WBC 11.6 (H) 10/17/2019 0405   RBC 3.23 (L) 10/17/2019 0405   HGB 9.5 (L) 10/17/2019 0405   HCT 29.8 (L) 10/17/2019 0405   PLT 190 10/17/2019 0405   MCV 92.3 10/17/2019 0405   MCH 29.4 10/17/2019 0405   MCHC 31.9 10/17/2019 0405   RDW 14.7 10/17/2019 0405   LYMPHSABS 1.7 12/10/2014 1106   MONOABS 0.6 12/10/2014 1106   EOSABS 0.2 12/10/2014 1106   BASOSABS 0.0 12/10/2014 1106    BMET    Component Value Date/Time   NA 139 10/16/2019 0409   K 4.2 10/16/2019 0409   CL 109 10/16/2019 0409   CO2 19 (L) 10/16/2019 0409   GLUCOSE 111 (H) 10/16/2019 0409   BUN 20 10/16/2019 0409   CREATININE 1.54 (H) 10/16/2019 0409   CALCIUM 8.9 10/16/2019 0409   GFRNONAA 44 (L) 10/16/2019 0409   GFRAA 51 (L) 10/16/2019 0409    INR    Component Value Date/Time   INR 1.2 10/15/2019 1215     Intake/Output Summary (Last 24 hours) at 10/17/2019 0920 Last data filed at 10/17/2019 0522 Gross per 24 hour  Intake 2552.32 ml  Output 553 ml  Net 1999.32 ml     Assessment/plan:  74 y.o. 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  Patient likely with underlying dementia and increasing confusion overnight.  From surgical standpoint he is doing well but having persistent hypertension.  Will need to stay in the ICU until he can come off of drips    [name] [name] C. Donzetta Matters, MD [facility] [place] Office: [phone] Pager:  [phone]  10/17/2019 9:20 AM  Addendum: I discussed the findings of the MRI with patient.  Fortunately he seems to be somewhat clear on exam this morning.  Neurology is following.  Patient is already on anticoagulation for PE.  Hopefully when blood pressure is controlled we can get him transferred out of the ICU in the next 1 to 2 days.  [name] [name]

## 2019-10-17 NOTE — Progress Notes (Signed)
CRITICAL VALUE ALERT  Critical Value:  aPTT 197   Date & Time Notied:  10/17/19 [id number]  Provider Notified: Vertis Kelch, [facility]  Orders Received/Actions taken: See new orders

## 2019-10-18 ENCOUNTER — Inpatient Hospital Stay (HOSPITAL_COMMUNITY): Payer: Medicare Other

## 2019-10-18 DIAGNOSIS — I639 Cerebral infarction, unspecified: Secondary | ICD-10-CM

## 2019-10-18 LAB — BASIC METABOLIC PANEL
Anion gap: 10 (ref 5–15)
BUN: 15 mg/dL (ref 8–23)
CO2: 19 mmol/L — ABNORMAL LOW (ref 22–32)
Calcium: 8.7 mg/dL — ABNORMAL LOW (ref 8.9–10.3)
Chloride: 107 mmol/L (ref 98–111)
Creatinine, Ser: 1.38 mg/dL — ABNORMAL HIGH (ref 0.61–1.24)
GFR calc Af Amer: 58 mL/min — ABNORMAL LOW (ref >60)
GFR calc non Af Amer: 50 mL/min — ABNORMAL LOW (ref >60)
Glucose, Bld: 114 mg/dL — ABNORMAL HIGH (ref 70–99)
Potassium: 3.7 mmol/L (ref 3.5–5.1)
Sodium: 136 mmol/L (ref 135–145)

## 2019-10-18 LAB — GLUCOSE, CAPILLARY
Glucose-Capillary: 104 mg/dL — ABNORMAL HIGH (ref 70–99)
Glucose-Capillary: 104 mg/dL — ABNORMAL HIGH (ref 70–99)
Glucose-Capillary: 130 mg/dL — ABNORMAL HIGH (ref 70–99)
Glucose-Capillary: 122 mg/dL — ABNORMAL HIGH (ref 70–99)

## 2019-10-18 LAB — CBC
HCT: 30.7 % — ABNORMAL LOW (ref 39.0–52.0)
Hemoglobin: 9.8 g/dL — ABNORMAL LOW (ref 13.0–17.0)
MCH: 29.3 pg (ref 26.0–34.0)
MCHC: 31.9 g/dL (ref 30.0–36.0)
MCV: 91.9 fL (ref 80.0–100.0)
Platelets: 182 10*3/uL (ref 150–400)
RBC: 3.34 MIL/uL — ABNORMAL LOW (ref 4.22–5.81)
RDW: 14.7 % (ref 11.5–15.5)
WBC: 12 10*3/uL — ABNORMAL HIGH (ref 4.0–10.5)
nRBC: 0 % (ref 0.0–0.2)

## 2019-10-18 LAB — APTT: aPTT: 90 seconds — ABNORMAL HIGH (ref 24–36)

## 2019-10-18 LAB — HEPARIN LEVEL (UNFRACTIONATED)
Heparin Unfractionated: 0.44 IU/mL (ref 0.30–0.70)
Heparin Unfractionated: 0.46 IU/mL (ref 0.30–0.70)

## 2019-10-18 NOTE — Progress Notes (Signed)
STROKE TEAM PROGRESS NOTE   HISTORY OF PRESENT ILLNESS (per record) [name] [name] is an 74 y.o. 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 Neurology consulted 2/27 for AMS and weakness.  Per nursing and wife patient has been having intermittent confusion. RN and wife have also noticed some weakness as well as increasing lethargy. Patient initially admitted for type B aortic aneurysm to be managed medically, but was taken to OR on 2/25. Hospital course course:  2/18: admitted for HTN emergency d/t Type distal thoracic aortic aneurysm. Small PE 2/25 surgical repair of type B  Thoracic aortic aneurysm  2/27: CTH: hypodensities in right cerebellum and right occipital lobe suspicious for acute to subacute infarct. Remains on labetalol infusion to control BP's. MRI: bilateral cerebellar and cerebral infarcts NIHSS: 1 Last known well-prior to admission MRIs prior to admission-2-due to chronic back pain   INTERVAL HISTORY His family is not at bedside. Patient's exam is non focal.     OBJECTIVE Vitals:   10/18/19 0400 10/18/19 0430 10/18/19 0437 10/18/19 0447  BP: (!) 153/83 (!) 159/77 (!) 159/77   Pulse: 71 64    Resp: (!) 28 20    Temp: 99 F (37.2 C)     TempSrc: Oral     SpO2: 93% 94%    Weight:    91.7 kg  Height:        CBC:  Recent Labs  Lab 10/16/19 0409 10/17/19 0405  WBC 10.7* 11.6*  HGB 10.5* 9.5*  HCT 32.4* 29.8*  MCV 91.0 92.3  PLT 210 [id number]    Basic Metabolic Panel:  Recent Labs  Lab 10/15/19 0507 10/15/19 0507 10/15/19 1215 10/16/19 0409  NA 136   < > 139 139  K 3.7   < > 4.5 4.2  CL 106   < > 108 109  CO2 20*   < > 20* 19*  GLUCOSE 212*   < > 154* 111*  BUN 19   < > 18 20  CREATININE 1.60*   < > 1.58* 1.54*  CALCIUM 8.9   < > 8.7* 8.9  MG 2.1  --  2.0  --    < > = values in this interval not displayed.     Lipid Panel:     Component Value Date/Time   CHOL 136 10/09/2019 1859   TRIG 121 10/09/2019 1859   HDL 40 (L) 10/09/2019 1859   CHOLHDL 3.4 10/09/2019 1859   VLDL 24 10/09/2019 1859   LDLCALC 72 10/09/2019 1859   HgbA1c:  Lab Results  Component Value Date   HGBA1C 6.7 (H) 10/09/2019   Urine Drug Screen: No results found for: LABOPIA, COCAINSCRNUR, LABBENZ, AMPHETMU, THCU, LABBARB  Alcohol Level No results found for: ETH  IMAGING  DG Abd 1 View 10/16/2019 IMPRESSION:  Slight gaseous distention of the proximal colon consistent with an ileus.   CT HEAD WO CONTRAST 10/17/2019 IMPRESSION:  Subtle area of gray-white differentiation loss in the posterior right occipital lobe extending from the white matter to cortex, not associated with mass effect. Subtle hypodensity also in the right cerebellum, inferiorly in the inferior cerebellar peduncle. Findings are suspicious for acute to subacute infarct, in particular the area in the right occipital lobe. Signs of generalized atrophy and chronic white matter disease.   MR BRAIN WO CONTRAST 10/17/2019 IMPRESSION:  1. Small acute bilateral  cerebral and cerebellar infarcts consistent with emboli.  2. Moderate chronic small vessel ischemic disease.   VAS US CAROTID 10/17/2019 Summary:  Right Carotid: Velocities in the right ICA are consistent with a 1-39% stenosis.  Left Carotid: Velocities in the left ICA are consistent with a 1-39% stenosis. Vertebrals:   Bilateral vertebral arteries demonstrate antegrade flow.  Subclavians: Normal flow hemodynamics were seen in bilateral subclavian arteries. Preliminary    Transthoracic Echocardiogram  10/09/2019 IMPRESSIONS  1. Left ventricular ejection fraction, by estimation, is 60 to 65%. The  left ventricle has normal function. The left ventricle has no regional  wall motion abnormalities. Left ventricular diastolic parameters are  consistent with Grade I diastolic  dysfunction (impaired  relaxation).  2. Right ventricular systolic function is normal. The right ventricular  size is normal. Tricuspid regurgitation signal is inadequate for assessing  PA pressure.  3. The mitral valve is grossly normal. No evidence of mitral valve  regurgitation. No evidence of mitral stenosis.  4. The aortic valve is tricuspid. Aortic valve regurgitation is mild. No  aortic stenosis is present.  5. Dissection flap present in descending aorta. Known type B dissection per medical record. Ascending aortic aneurysm measured 46 mm on CTA. Aneurysm of the ascending aorta, measuring 45 mm.  6. The inferior vena cava is normal in size with greater than 50%  respiratory variability, suggesting right atrial pressure of 3 mmHg.   ECG - SR rate 94 BPM. (See cardiology reading for complete details)  PHYSICAL EXAM Blood pressure (!) 159/77, pulse 64, temperature 99 F (37.2 C), temperature source Oral, resp. rate 20, height 5\' 11"  (1.803 m), weight 91.7 kg, SpO2 94 %.   Exam: NAD, pleasant but slightly distressed and anxious, states he does not know if he is ok                Speech:    Speech is normal; fluent and spontaneous with normal comprehension.No dysarthria.   Cognition:   Appears to have baseline cognitive deficits.    Cranial Nerves:    The pupils are equal, round, and reactive to light.Trigeminal sensation is intact and the muscles of mastication are normal. The face is symmetric. The palate elevates in the midline. Hearing intact. Voice is normal. Shoulder shrug is normal. The tongue has normal motion without fasciculations.   Coordination:  No dysmetria or ataxia noted on FTN  Motor Observation:    No asymmetry, no atrophy, and no involuntary movements noted. Tone:    Normal muscle tone.     Strength:    Strength is V/V in the upper and lower limbs.      Sensation: intact to LT   ASSESSMENT/PLAN [name] is a 74 y.o. male with history of HTN, HLD, DM, chronic  back pain, distal thoracic aneurysm admitted to [facility] 10/08/19 with elevated BP, leg weakness, chest and epigastric pain. 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 On 10/17/19 an MRI was performed to evaluate AMS. This revealed bilateral cerebellar and cerebral infarcts. He did not receive IV t-PA due to recent surgery, anticoagulation and minimal deficits.   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    Code Stroke CT Head - not ordered  CT head -Findings are suspicious for acute to subacute infarct, in particular the area in the  right occipital lobe. Signs of generalized atrophy and chronic white matter disease.    MRI head - Small acute bilateral cerebral and cerebellar infarcts consistent with emboli. Moderate chronic small vessel ischemic disease.   MRA head - not performed  CTA H&N - not performed  CT Perfusion - not performed  Carotid Doppler - unremarkable  2D Echo - EF 60 - 65%. No cardiac source of emboli identified.   Sars Corona Virus 2 - negative  LDL - 72  HgbA1c - 6.7  UDS - not ordered  VTE prophylaxis - SCDs ; Heparin IV Diet  Diet Order            Diet Heart Room service appropriate? Yes; Fluid consistency: Thin  Diet effective now               aspirin 81 mg daily prior to admission, now on heparin IV  Patient will be counseled to be compliant with his antithrombotic medications  Ongoing aggressive stroke risk factor management  Therapy recommendations:  pending  Disposition:  Pending  Hypertension  Home BP meds: Norvasc ; Lotensin HCT  Current BP meds: Norvasc ; Coreg ; Hydralazine ; HCTZ ; Labetalol ; metoprolol prn  Stable . BP goals per vascular surgery . Long-term BP goal normotensive  Hyperlipidemia  Home Lipid lowering medication: Crestor 10 mg  daily  LDL 72, goal < 70  Current lipid lowering medication: Lipitor 10 mg daily  Continue statin at discharge  Diabetes  Home diabetic meds: metformin  Current diabetic meds: SSI   HgbA1c 6.7, goal < 7.0 Recent Labs    10/17/19 1240 10/17/19 1558 10/17/19 2103  GLUCAP 151* 146* 121*     Other Stroke Risk Factors  Advanced age  Former cigarette smoker - quit  Overweight, Body mass index is 28.2 kg/m., recommend weight loss, diet and exercise as appropriate   Other Active Problems  Code status - Full Code  Post op anemia - Hb 9.5  Mild leukocytosis - 11.6 (temp - 99)  CKD stage 3a - creatinine - 1.54   Hospital day # 10  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 Bur recommend to complete stroke workup, will order MRA of the head.   Personally examined patient and images, and have participated in and made any corrections needed to history, physical, neuro exam,assessment and plan as stated above.  I have personally obtained the history, evaluated lab date, reviewed imaging studies and agree with radiology interpretations.    Sarina Ill, MD Stroke Neurology   A total of 25 minutes was spent for the care of this patient, spent on counseling patient and family on different diagnostic and therapeutic options, counseling and coordination of care, riskd ans benefits of management, compliance, or risk factor reduction and education. .   To contact Stroke Continuity provider, please refer to [email or web address] After hours, contact General Neurology

## 2019-10-18 NOTE — Progress Notes (Addendum)
Calvin for  heparin  Indication: PE  Allergies  Allergen Reactions  . Lyrica [Pregabalin] Nausea Only and Other (See Comments)    Hallucinations and dizziness, also  . Sulfa Antibiotics Other (See Comments)    Reaction not recalled     Patient Measurements: Height: 5\' 11"  (180.3 cm) Weight: 202 lb 2.6 oz (91.7 kg) IBW/kg (Calculated) : 75.3 Heparin Dosing Weight: 89.4 kg  Vital Signs: Temp: 99 F (37.2 C) (02/28 0400) Temp Source: Oral (02/28 0400) BP: 130/64 (02/28 0700) Pulse Rate: 66 (02/28 0700)  Labs: Recent Labs    10/15/19 1215 10/15/19 1215 10/16/19 0409 10/16/19 0409 10/17/19 0405 10/17/19 0505 10/17/19 0755 10/17/19 1749 10/18/19 0444  HGB 10.4*   < > 10.5*   < > 9.5*  --   --   --  9.8*  HCT 32.6*   < > 32.4*  --  29.8*  --   --   --  30.7*  PLT 234   < > 210  --  190  --   --   --  182  APTT 40*   < >  --   --   --   --  197* 134* 90*  LABPROT 15.3*  --   --   --   --   --   --   --   --   INR 1.2  --   --   --   --   --   --   --   --   HEPARINUNFRC  --   --   --   --   --  0.70  --  0.63 0.44  CREATININE 1.58*  --  1.54*  --   --   --   --   --  1.38*   < > = values in this interval not displayed.    Estimated Creatinine Clearance: 54.4 mL/min (A) (by C-G formula based on SCr of 1.38 mg/dL (H)).  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 Patient is not taking anticoagulation PTA. He was started on apixaban (last apixaban dose was 2/22 @ 9:30pm) but this was stopped for procedure  He is now s/p procedure and lumbar drain was removed. Heparin level this morning is now within goal 0.44 and now correlated with aPTT. Hgb 9.8, plt 182. No bleeding or infusion issues per RN.   Afternoon update: heparin level remains therapeutic at 0.46  Goal of Therapy:  Heparin level 0.3-0.5 Monitor  platelets by anticoagulation protocol: Yes   Plan:  -Continue heparin at 1800 units/hr -Heparin level and CBC daily -Monitor for bleeding  Vertis Kelch, PharmD, [facility] PGY2 Cardiology Pharmacy Resident Phone [phone] 10/18/2019       7:30 AM  Please check [email or web address] for unit-specific pharmacist phone numbers

## 2019-10-18 NOTE — Progress Notes (Signed)
Assisted tele visit to patient with family member.  Litsy Epting M, RN  

## 2019-10-18 NOTE — Progress Notes (Signed)
  Progress Note    10/18/2019 9:26 AM 3 Days Post-Op  Subjective:  No overnight issues  Vitals:   10/18/19 0830 10/18/19 0900  BP: 128/65 139/61  Pulse: (!) 59 62  Resp: (!) 22 19  Temp:    SpO2: 98% 100%    Physical Exam: Awake and alert Moving all extremities without limitation Abdomen is mildly distended Palpable PT pulses bilaterally Right groin soft without hematoma  CBC    Component Value Date/Time   WBC 12.0 (H) 10/18/2019 0444   RBC 3.34 (L) 10/18/2019 0444   HGB 9.8 (L) 10/18/2019 0444   HCT 30.7 (L) 10/18/2019 0444   PLT 182 10/18/2019 0444   MCV 91.9 10/18/2019 0444   MCH 29.3 10/18/2019 0444   MCHC 31.9 10/18/2019 0444   RDW 14.7 10/18/2019 0444   LYMPHSABS 1.7 12/10/2014 1106   MONOABS 0.6 12/10/2014 1106   EOSABS 0.2 12/10/2014 1106   BASOSABS 0.0 12/10/2014 1106    BMET    Component Value Date/Time   NA 136 10/18/2019 0444   K 3.7 10/18/2019 0444   CL 107 10/18/2019 0444   CO2 19 (L) 10/18/2019 0444   GLUCOSE 114 (H) 10/18/2019 0444   BUN 15 10/18/2019 0444   CREATININE 1.38 (H) 10/18/2019 0444   CALCIUM 8.7 (L) 10/18/2019 0444   GFRNONAA 50 (L) 10/18/2019 0444   GFRAA 58 (L) 10/18/2019 0444    INR    Component Value Date/Time   INR 1.2 10/15/2019 1215     Intake/Output Summary (Last 24 hours) at 10/18/2019 0926 Last data filed at 10/18/2019 0900 Gross per 24 hour  Intake 1974.31 ml  Output 1260 ml  Net 714.31 ml     Assessment/plan:  74 y.o. male is s/p thoracic endograft and for acute type B aortic dissection with IMH maximal aortic diameter 5.9 cm.  This has been complicated with cerebellar and cerebral emboli.  Neurology is following input is much appreciated.  He is on heparin drip already for PE.  Abdomen is distended today we will get KUB.  Carotid Dopplers performed with mild stenosis bilaterally unlikely source of emboli.    Gilbert Manolis C. Donzetta Matters, MD [facility] [place] Office: [phone] Pager:  [phone]  10/18/2019 9:26 AM

## 2019-10-18 NOTE — Progress Notes (Signed)
NAME:  [name] [name], MRN:  [id number], DOB:  April 18, 1946, LOS: 52 ADMISSION DATE:  10/08/2019, CONSULTATION DATE:  10/08/2019 REFERRING MD:  Langston Masker - [facility], CHIEF COMPLAINT:  Aortic dissection with hypertensive emergency.   HPI/course in hospital  74 year old man presented on 10/08/19 with 10/10 lower chest and epigastric pain. Had been on/off for several weeks, with worst episode on day of presentation. He was also complaining of back pain and was scheduled for spinal stenosis surgery.  Patient found to have Type B aortic dissection on CTA on 2/18 with aneurysmal dilation measuring up to approximately 4 cm.  Patient was seen by Vascular sx who recommended medical management, and patient was placed on beta blockers.  On repeat CTA on 2/22, however, the aneurysmal dilatation had increased to 5.9cm.  Patient had surgical repair on 2/25.  Past Medical History   has a past medical history of Chronic back pain, Enlarged prostate, GERD (gastroesophageal reflux disease), History of colon polyps, History of kidney stones, History of stress test, Hyperlipidemia, and Hypertension.   has a past surgical history that includes Tonsillectomy; fatty tissue removed from stomach; Colonoscopy; Esophagogastroduodenoscopy; right ankle surgery; Lumbar laminectomy/decompression microdiscectomy (N/A, 07/08/2013); Anterior lat lumbar fusion (Left, 12/15/2014); and Thoracic aortic endovascular stent graft (N/A, 10/15/2019).   Hospital course  2/18 Admitted for HTN emergency secondary to Type B aortic dissection 2/25 Surgical repair of aneurysm 2/27 Neurology consulted for bilateral cerebellar and cerebral infarcts  Significant Studies  CTA CAP 2/18 - aneurysmal dilation of thoracic and abdominal aorta, small PE in RUL pulmonary artery and small subsegmental PE in RLL, soft tissue density in the left common iliac vein CT head 2/27 - Subtle are of gray-white differentiation loss in posterior right occipital lobe, no mass  effect. Subtle hypodensity in right cerebellum,. Findings suspicious for acute to subacute infarct particularly in the right occipital lobe MR Brain 2/27 - small acute bilateral cerebral and cerebellar infarcts.  Interim history/subjective:  Yesterday, patient lethargy and intermittent episodes of confusion. CT head was concerning for infarct so follow-up MRI obtained and demonstrated small acute bilateral cerebral and cerebellar infarcts.  Weaned off labetalol overnight.  Objective   Blood pressure 139/61, pulse 62, temperature 98.1 F (36.7 C), temperature source Axillary, resp. rate 19, height 5\' 11"  (1.803 m), weight 91.7 kg, SpO2 100 %.        Intake/Output Summary (Last 24 hours) at 10/18/2019 0919 Last data filed at 10/18/2019 0600 Gross per 24 hour  Intake 1700.3 ml  Output 1260 ml  Net 440.3 ml   Filed Weights   10/16/19 0456 10/17/19 0506 10/18/19 0447  Weight: 89.7 kg 92 kg 91.7 kg   Physical Exam: General: Pleasantly confused. Well-appearing, no acute distress HENT: [place], AT, OP clear, MMM Eyes: EOMI, no scleral icterus Respiratory: Clear to auscultation bilaterally.  No crackles, wheezing or rales Cardiovascular: RRR, -M/R/G, no JVD GI: BS+, soft, nontender Extremities:-Edema,-tenderness Neuro: Awake, alert, only oriented to self. Follows commands. Moves extremities x 4 Skin: Intact, no rashes or bruising Psych: Normal mood, normal affect  Ancillary tests (personally reviewed)  CBC: Recent Labs  Lab 10/15/19 0507 10/15/19 1215 10/16/19 0409 10/17/19 0405 10/18/19 0444  WBC 7.0 7.8 10.7* 11.6* 12.0*  HGB 10.4* 10.4* 10.5* 9.5* 9.8*  HCT 31.6* 32.6* 32.4* 29.8* 30.7*  MCV 91.1 93.1 91.0 92.3 91.9  PLT 233 234 210 190 [id number]    Basic Metabolic Panel: Recent Labs  Lab 10/14/19 0831 10/15/19 0507 10/15/19 1215 10/16/19 0409 10/18/19 0444  NA 136 136 139 139 136  K 4.0 3.7 4.5 4.2 3.7  CL 107 106 108 109 107  CO2 18* 20* 20* 19* 19*  GLUCOSE 116* 212*  154* 111* 114*  BUN 19 19 18 20 15   CREATININE 1.46* 1.60* 1.58* 1.54* 1.38*  CALCIUM 9.0 8.9 8.7* 8.9 8.7*  MG  --  2.1 2.0  --   --    GFR: Estimated Creatinine Clearance: 54.4 mL/min (A) (by C-G formula based on SCr of 1.38 mg/dL (H)). Recent Labs  Lab 10/15/19 1215 10/16/19 0409 10/17/19 0405 10/18/19 0444  WBC 7.8 10.7* 11.6* 12.0*    Liver Function Tests: No results for input(s): AST, ALT, ALKPHOS, BILITOT, PROT, ALBUMIN in the last 168 hours. No results for input(s): LIPASE, AMYLASE in the last 168 hours. Recent Labs  Lab 10/17/19 1557  AMMONIA 14    ABG No results found for: PHART, PCO2ART, PO2ART, HCO3, TCO2, ACIDBASEDEF, O2SAT   Coagulation Profile: Recent Labs  Lab 10/15/19 0507 10/15/19 1215  INR 1.2 1.2    Cardiac Enzymes: No results for input(s): CKTOTAL, CKMB, CKMBINDEX, TROPONINI in the last 168 hours.  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 Study was optimized for arterial phase and would be unusual for both conditions to coexist. LE Dopplers negative for DVT. - Continue on heparin gtt  Bilateral cerebral and cerebellar infarcts - Appreciate Neurology consult. Imaging and work-up per consult team -  PT/OT/Speech  Abdominal Pain: Likely 2/2 ileus from opiate use.  XR shows slight gaseous distention of the proximal colon c/w and ileus. - KUB ordered by VVS - miralax prn - bisacodyl prn  BPH:  - Flomax  Chronic Back Pain: - gabapentin - lidocaine patch  AKI: Improving  - Monitor UOP/Cr  Type 2 diabetes on oral agents - hold home metformin - SSI  Dyslipidemia - Continue statin  Daily Goals Checklist  Blood pressure target: keep SBP 120-140, MAP >70 DVT prophylaxis: Heparin gtt GI prophylaxis: protonix Code Status: Full code Family Communication: Updated patient at bedside on 2/28. Wife on the way to hospital. RN plans to update on plan Disposition: Transfer to floor. Will contact [place] Time devoted to patient care services described in this note is 36 Minutes.   Rodman Pickle, M.D. [facility] Pulmonary/Critical Care Medicine 10/18/2019 9:20 AM   Please see Amion for pager number to reach on-call Pulmonary and Critical Care Team.

## 2019-10-18 NOTE — Progress Notes (Signed)
Physical Therapy Treatment Patient Details Name: [name] [name] MRN: [id number] DOB: [birth year] Today's Date: 10/18/2019    History of Present Illness 74 year old man with 10/10 lower chest and epigastric pain. Has been on/off for several weeks, with worst episode today. Has been complaining of back pain as well and is scheduled for spinal stenosis surgery but now states that now both the back and abdominal pains are simultaneous. 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    PT Comments    Pt tolerated treatment well, appearing frustrated at times due to current functional deficits and cognitive status. Pt with motor planning deficits present during transfers and gait training, requiring simple one step commands to improve function. Pt demonstrates functional weakness during gait and transfers requiring mod-maxA to mobilize at this time with use of RW. Pt remains at a high falls risk and will benefit from aggressive mobilization to improve functional mobility and reduce caregiver burden. Pt will benefit from high intensity inpatient PT services at time of discharge to aide in a return toward independent mobility.   Follow Up Recommendations  CIR;Supervision/Assistance - 24 hour     Equipment Recommendations  (defer to post-acute setting)    Recommendations for Other Services       Precautions / Restrictions Precautions Precautions: Fall Restrictions Weight Bearing Restrictions: No    Mobility  Bed Mobility Overal bed mobility: Needs Assistance Bed Mobility: Sit to Supine       Sit to supine: Min assist      Transfers Overall transfer level: Needs assistance Equipment used: Rolling walker (2 wheeled) Transfers: Sit to/from Stand Sit to Stand: Mod assist         General transfer comment: PT providing verbal cues for  hand placement and tactile and verbal cues for forward rocking and lean  Ambulation/Gait Ambulation/Gait assistance: Mod assist Gait Distance (Feet): 10 Feet(7', 10') Assistive device: Rolling walker (2 wheeled) Gait Pattern/deviations: Step-to pattern;Decreased step length - right;Decreased step length - left Gait velocity: reduced Gait velocity interpretation: <1.31 ft/sec, indicative of household ambulator General Gait Details: pt with short shuffling steps, increased knee flexion during stance phase bilaterally, and increased trunk flexion. Pt with impaired gait sequencing, improves mildly with visual cues for step length and tactile cues for knee and hip extension during gait cycle   Stairs             Wheelchair Mobility    Modified Rankin (Stroke Patients Only)       Balance Overall balance assessment: Needs assistance Sitting-balance support: Single extremity supported;Feet supported Sitting balance-Leahy Scale: Fair Sitting balance - Comments: minG   Standing balance support: Bilateral upper extremity supported Standing balance-Leahy Scale: Poor Standing balance comment: modA with BUE support of RW                            Cognition Arousal/Alertness: Awake/alert Behavior During Therapy: WFL for tasks assessed/performed Overall Cognitive Status: Impaired/Different from baseline Area of Impairment: Orientation;Memory;Following commands;Safety/judgement;Problem solving;Awareness                     Memory: Decreased recall of precautions;Decreased short-term memory Following Commands: Follows one step commands consistently;Follows multi-step commands with increased time Safety/Judgement: Decreased awareness of safety;Decreased awareness of deficits Awareness: Emergent Problem Solving: Slow processing;Decreased initiation;Difficulty sequencing;Requires  verbal cues;Requires tactile cues        Exercises      General Comments General  comments (skin integrity, edema, etc.): VSS on RA      Pertinent Vitals/Pain Pain Assessment: Faces Faces Pain Scale: Hurts even more Pain Location: abdomen Pain Descriptors / Indicators: Aching Pain Intervention(s): Limited activity within patient's tolerance    Home Living                      Prior Function            PT Goals (current goals can now be found in the care plan section) Acute Rehab PT Goals Patient Stated Goal: To return to baseline Progress towards PT goals: Progressing toward goals    Frequency    Min 4X/week      PT Plan Current plan remains appropriate    Co-evaluation              AM-PAC PT "6 Clicks" Mobility   Outcome Measure  Help needed turning from your back to your side while in a flat bed without using bedrails?: A Little Help needed moving from lying on your back to sitting on the side of a flat bed without using bedrails?: A Little Help needed moving to and from a bed to a chair (including a wheelchair)?: A Lot Help needed standing up from a chair using your arms (e.g., wheelchair or bedside chair)?: A Lot Help needed to walk in hospital room?: A Lot Help needed climbing 3-5 steps with a railing? : Total 6 Click Score: 13    End of Session Equipment Utilized During Treatment: Gait belt Activity Tolerance: Patient tolerated treatment well Patient left: in bed;with call bell/phone within reach;with bed alarm set;with nursing/sitter in room Nurse Communication: Mobility status PT Visit Diagnosis: Unsteadiness on feet (R26.81);Muscle weakness (generalized) (M62.81)     Time: [id number] PT Time Calculation (min) (ACUTE ONLY): 38 min  Charges:  $Gait Training: 8-22 mins $Therapeutic Activity: 23-37 mins                     Zenaida Niece, PT, DPT Acute Rehabilitation Pager: [phone]    Zenaida Niece 10/18/2019, 4:57 PM

## 2019-10-19 ENCOUNTER — Inpatient Hospital Stay (HOSPITAL_COMMUNITY): Payer: Medicare Other

## 2019-10-19 ENCOUNTER — Ambulatory Visit (HOSPITAL_COMMUNITY): Admission: RE | Admit: 2019-10-19 | Payer: Medicare Other | Source: Ambulatory Visit

## 2019-10-19 DIAGNOSIS — I2699 Other pulmonary embolism without acute cor pulmonale: Secondary | ICD-10-CM

## 2019-10-19 DIAGNOSIS — K567 Ileus, unspecified: Secondary | ICD-10-CM

## 2019-10-19 DIAGNOSIS — I639 Cerebral infarction, unspecified: Secondary | ICD-10-CM

## 2019-10-19 DIAGNOSIS — E1351 Other specified diabetes mellitus with diabetic peripheral angiopathy without gangrene: Secondary | ICD-10-CM

## 2019-10-19 DIAGNOSIS — N179 Acute kidney failure, unspecified: Secondary | ICD-10-CM

## 2019-10-19 LAB — CBC
HCT: 29.8 % — ABNORMAL LOW (ref 39.0–52.0)
Hemoglobin: 9.6 g/dL — ABNORMAL LOW (ref 13.0–17.0)
MCH: 29.5 pg (ref 26.0–34.0)
MCHC: 32.2 g/dL (ref 30.0–36.0)
MCV: 91.7 fL (ref 80.0–100.0)
Platelets: 204 10*3/uL (ref 150–400)
RBC: 3.25 MIL/uL — ABNORMAL LOW (ref 4.22–5.81)
RDW: 14.7 % (ref 11.5–15.5)
WBC: 10.8 10*3/uL — ABNORMAL HIGH (ref 4.0–10.5)
nRBC: 0 % (ref 0.0–0.2)

## 2019-10-19 LAB — BASIC METABOLIC PANEL
Anion gap: 11 (ref 5–15)
BUN: 15 mg/dL (ref 8–23)
CO2: 19 mmol/L — ABNORMAL LOW (ref 22–32)
Calcium: 8.4 mg/dL — ABNORMAL LOW (ref 8.9–10.3)
Chloride: 103 mmol/L (ref 98–111)
Creatinine, Ser: 1.21 mg/dL (ref 0.61–1.24)
GFR calc Af Amer: >60 mL/min (ref >60)
GFR calc non Af Amer: 59 mL/min — ABNORMAL LOW (ref >60)
Glucose, Bld: 103 mg/dL — ABNORMAL HIGH (ref 70–99)
Potassium: 3.4 mmol/L — ABNORMAL LOW (ref 3.5–5.1)
Sodium: 133 mmol/L — ABNORMAL LOW (ref 135–145)

## 2019-10-19 LAB — GLUCOSE, CAPILLARY
Glucose-Capillary: 124 mg/dL — ABNORMAL HIGH (ref 70–99)
Glucose-Capillary: 118 mg/dL — ABNORMAL HIGH (ref 70–99)
Glucose-Capillary: 108 mg/dL — ABNORMAL HIGH (ref 70–99)
Glucose-Capillary: 94 mg/dL (ref 70–99)

## 2019-10-19 LAB — LACTATE DEHYDROGENASE: LDH: 225 U/L — ABNORMAL HIGH (ref 98–192)

## 2019-10-19 LAB — HEPARIN LEVEL (UNFRACTIONATED): Heparin Unfractionated: 0.36 IU/mL (ref 0.30–0.70)

## 2019-10-19 MED ORDER — MORPHINE SULFATE (PF) 2 MG/ML IV SOLN
2.0000 mg | INTRAVENOUS | Status: DC | PRN
  Administered 2019-10-19: 2 mg via INTRAVENOUS
  Filled 2019-10-19: qty 1

## 2019-10-19 MED ORDER — LABETALOL HCL 5 MG/ML IV SOLN: 10.0000 mg | INTRAVENOUS | Status: DC | PRN

## 2019-10-19 MED ORDER — DIATRIZOATE MEGLUMINE & SODIUM 66-10 % PO SOLN: 90.0000 mL | Freq: Once | ORAL | Status: DC

## 2019-10-19 MED ORDER — ACETAMINOPHEN 650 MG RE SUPP: 650.0000 mg | Freq: Four times a day (QID) | RECTAL | Status: DC | PRN

## 2019-10-19 MED ORDER — BISACODYL 10 MG RE SUPP
10.0000 mg | Freq: Every day | RECTAL | Status: DC
  Administered 2019-10-19 – 2019-10-24 (×4): 10 mg via RECTAL
  Filled 2019-10-19 (×5): qty 1

## 2019-10-19 MED ORDER — ACETAMINOPHEN 325 MG PO TABS
650.0000 mg | ORAL_TABLET | Freq: Four times a day (QID) | ORAL | Status: DC | PRN
  Administered 2019-10-23: 650 mg via ORAL
  Filled 2019-10-19: qty 2

## 2019-10-19 MED ORDER — STROKE: EARLY STAGES OF RECOVERY BOOK
Freq: Once | Status: AC
  Filled 2019-10-19: qty 1

## 2019-10-19 MED ORDER — DIATRIZOATE MEGLUMINE & SODIUM 66-10 % PO SOLN
90.0000 mL | Freq: Once | ORAL | Status: AC
  Administered 2019-10-19: 90 mL via ORAL
  Filled 2019-10-19 (×2): qty 90

## 2019-10-19 MED ORDER — POTASSIUM CHLORIDE 10 MEQ/100ML IV SOLN
10.0000 meq | INTRAVENOUS | Status: AC
  Administered 2019-10-19 (×5): 10 meq via INTRAVENOUS
  Filled 2019-10-19 (×5): qty 100

## 2019-10-19 MED ORDER — HYDRALAZINE HCL 20 MG/ML IJ SOLN
10.0000 mg | INTRAMUSCULAR | Status: DC
  Administered 2019-10-19 – 2019-10-24 (×31): 10 mg via INTRAVENOUS
  Filled 2019-10-19 (×30): qty 1

## 2019-10-19 NOTE — Progress Notes (Signed)
[facility] PROGRESS NOTE    Progress Note  [name] [name]  [id number] DOB: 1945/10/19 DOA: 10/08/2019 PCP: Wenda Low, MD     Brief Narrative:   [name] [name] is an 74 y.o. male past medical history of BPH hypertension, nephrolithiasis and hyperlipidemia presents with lower chest and epigastric pain for several weeks intermittent.  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  Vascular surgery was consulted who recommended medical management and placed on beta-blocker.  Repeated CTA on 10/12/2019 however showed an aneurysmal dilation had increased to 5.9, so vascular surgery proceeded with vascular repair on 10/15/2019 with percutaneous endograft.  On 10/17/2019 the of the head showed right cerebellum and right occipital hypodensity suspicious for infarct.  MRI of the brain showed bilateral suborbital infarcts and cerebral infarction neurology was consulted.  Significant and studies: CTA CAP 2/18 - aneurysmal dilation of thoracic and abdominal aorta, small PE in RUL pulmonary artery and small subsegmental PE in RLL, soft tissue density in the left common iliac vein CT head 2/27 - Subtle are of gray-white differentiation loss in posterior right occipital lobe, no mass effect. Subtle hypodensity in right cerebellum,. Findings suspicious for acute to subacute infarct particularly in the right occipital lobe MR Brain 2/27 - small acute bilateral cerebral and cerebellar infarcts. 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 Goal blood pressure is to be less than [id number] systolic. Patient is currently on hydrochlorothiazide amlodipine Coreg hydralazine and labetalol as needed For pain he is currently on morphine and oxycodone, he was  continuing gabapentin.  Bilateral small pulmonary embolism seen on CT angio on 10/08/2019: This is probably an incidental finding, lower extremity Dopplers was negative for DVT. He is currently on IV heparin we will discuss with vascular when we could switch him to NOACs once he is able to take orals.  Bilateral cerebellar infarcts and cerebral infarct HgbA1c 6.7, started on statin therapy LDL 72 probably need to go on Crestor. MRI, MRA of the brain without contrast as above PT, OT they both recommended CIR. Carotid dopplers unremarkable Transthoracic Echo source of emboli identified.  With an EF of 60%. Is currently on IV heparin for PE we will need to switch him to a NOAC.  Acute kidney injury: Likely prerenal resolved with IV fluid hydration.  New ileus likely contributing to his abdominal pain in the setting of opioid use: He was started on MiraLAX and Dulcolax, abdominal x-ray done on 10/17/2019 is concerning for ileus. Placed patient n.p.o. monitor electrolytes keep potassium greater than 4, will give him Doculax enemas encourage ambulation. 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  Non-anion gap metabolic acidosis: Likely prerenal currently on IV fluids creatinine is improving hopefully his potassium will continue to improve check an LDH and a lactic acid.  BPH: Continue Flomax.    DVT prophylaxis: Heparin Family Communication:none Disposition Plan/Barrier to D/C:   Code Status:     Code Status Orders  (From admission, onward)         Start     Ordered   10/08/19 2055  Full code  Continuous     10/08/19 2055        Code Status History    Date Active Date Inactive Code Status Order ID Comments User Context   12/15/2014 2238 12/17/2014 1509 Full Code [id number]   Justice Britain, PA-C Inpatient   Advance Care Planning Activity        IV Access:    Peripheral IV   Procedures and diagnostic studies:   DG Abd 1 View  Result Date: 10/18/2019 CLINICAL DATA:  Abdominal distention. EXAM: ABDOMEN - 1 VIEW COMPARISON:  10/16/2019 FINDINGS: Diffuse gaseous distention of small bowel and colon again. Noted cecum is more decompressed than on the prior study in there is gas visible in the left colon today. Thoracic aortic stent graft incompletely visualized. Patient is status post lower lumbar fusion. IMPRESSION: Mild gaseous distention of small bowel and colon. Imaging features are not frankly suggestive of an obstructive pattern and features may reflect ileus. Electronically Signed   By: Misty Stanley M.D.   On: 10/18/2019 11:01   CT HEAD WO CONTRAST  Result Date: 10/17/2019 CLINICAL DATA:  Altered mental status with unclear cause EXAM: CT HEAD WITHOUT CONTRAST TECHNIQUE: Contiguous axial images were obtained from the base of the skull through the vertex without intravenous contrast. COMPARISON:  MRI of the brain from 2011 FINDINGS: Brain: Signs of atrophy and chronic microvascular ischemic change. No signs of hydrocephalus, midline shift, mass effect or intracranial hemorrhage. Subtle area of gray-white differentiation loss in the posterior right occipital lobe extending from the white matter to cortex, not associated with mass effect Subtle hypodensity also in the right cerebellum, inferiorly in the inferior cerebellar peduncle. Vascular: No hyperdense vessel or unexpected calcification. Skull: Normal. Negative for fracture or focal lesion. Sinuses/Orbits: Visualized paranasal sinuses and orbits are unremarkable. Other: None IMPRESSION: Subtle area of gray-white differentiation loss in the posterior right occipital lobe extending from the white matter to cortex, not associated with mass effect. Subtle hypodensity also in the right cerebellum, inferiorly in the  inferior cerebellar peduncle. Findings are suspicious for acute to subacute infarct, in particular the area in the right occipital lobe. Signs of generalized atrophy and chronic white matter disease. These results were called by telephone at the time of interpretation on 10/17/2019 at 12:17 pm to provider Dr. Loanne Drilling , who verbally acknowledged these results. Electronically Signed   By: Zetta Bills M.D.   On: 10/17/2019 12:18   MR ANGIO HEAD WO CONTRAST  Result Date: 10/18/2019 CLINICAL DATA:  Stroke follow-up. 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 EXAM: MRA HEAD WITHOUT CONTRAST TECHNIQUE: Angiographic images of the Circle of Willis were obtained using MRA technique without intravenous contrast. COMPARISON:  04/09/2010 FINDINGS: The study is mildly to moderately motion degraded. The visualized distal vertebral arteries are patent to the basilar with the left being strongly dominant. The basilar artery is widely patent. Posterior communicating arteries are diminutive or absent. PCAs are patent without evidence of significant proximal stenosis on the right. A left P1 stenosis is again seen, moderate on the prior MRA and not felt to have progressed within limitations of motion artifact today. The internal carotid arteries are patent from skull base to carotid termini without evidence of significant stenosis allowing for artifact. ACAs and MCAs are patent without evidence of proximal branch occlusion or significant proximal stenosis. No aneurysm is identified. IMPRESSION: 1. Motion degraded examination without medium or large vessel occlusion or significant proximal anterior circulation stenosis. 2. Chronic moderate left  P1 PCA stenosis. Electronically Signed   By: Logan Bores M.D.   On: 10/18/2019 17:19   MR BRAIN WO CONTRAST  Result Date: 10/17/2019 CLINICAL DATA:  Confusion. Recent endovascular repair of a thoracic aortic dissection. EXAM: MRI HEAD WITHOUT CONTRAST  TECHNIQUE: Multiplanar, multiecho pulse sequences of the brain and surrounding structures were obtained without intravenous contrast. COMPARISON:  Head CT 10/17/2019 and MRI 04/09/2010 FINDINGS: The study is mildly motion degraded. Brain: Acute infarcts measure 2.5 cm in the superior right occipital lobe and 1 cm at the lateral left temporal-occipital junction. Additional subcentimeter acute infarcts are present in the right parietal and occipital lobes, left internal capsule and bilateral cerebellum. No intracranial hemorrhage, mass, midline shift, or extra-axial fluid collection is identified. T2 hyperintensities in the cerebral white matter bilaterally have mildly progressed from the prior MRI and are nonspecific but compatible with moderate chronic small vessel ischemic disease. Chronic infarcts in the left centrum semiovale and corpus callosum are unchanged. There is moderate cerebral atrophy. Vascular: Major intracranial vascular flow voids are preserved. Skull and upper cervical spine: Unremarkable bone marrow signal. Sinuses/Orbits: Paranasal sinuses and mastoid air cells are clear. Unremarkable orbits. Other: None. IMPRESSION: 1. Small acute bilateral cerebral and cerebellar infarcts consistent with emboli. 2. Moderate chronic small vessel ischemic disease. Electronically Signed   By: Logan Bores M.D.   On: 10/17/2019 14:21   VAS US CAROTID  Result Date: 10/18/2019 Carotid Arterial Duplex Study Indications:       Type B aortic dissection, S/P TEVAR. Risk Factors:      Hypertension, hyperlipidemia, past history of smoking. Comparison Study:  No prior study on file for comparison. Performing Technologist: Sharion Dove RVS  Examination Guidelines: A complete evaluation includes B-mode imaging, spectral Doppler, color Doppler, and power Doppler as needed of all accessible portions of each vessel. Bilateral testing is considered an integral part of a complete examination. Limited examinations for  reoccurring indications may be performed as noted.  Right Carotid Findings: +----------+--------+--------+--------+------------------+------------------+             PSV cm/s EDV cm/s Stenosis Plaque Description Comments            +----------+--------+--------+--------+------------------+------------------+  CCA Prox   143      19                                   intimal thickening  +----------+--------+--------+--------+------------------+------------------+  CCA Distal 88       20                                   intimal thickening  +----------+--------+--------+--------+------------------+------------------+  ICA Prox   68       20                heterogenous                           +----------+--------+--------+--------+------------------+------------------+  ICA Distal 55       17                                                       +----------+--------+--------+--------+------------------+------------------+  ECA  140      20                                                       +----------+--------+--------+--------+------------------+------------------+ +----------+--------+-------+--------+-------------------+             PSV cm/s EDV cms Describe Arm Pressure (mmHG)  +----------+--------+-------+--------+-------------------+  Subclavian 97                                             +----------+--------+-------+--------+-------------------+ +---------+--------+--+--------+-+  Vertebral PSV cm/s 35 EDV cm/s 9  +---------+--------+--+--------+-+  Left Carotid Findings: +----------+--------+--------+--------+------------------+------------------+             PSV cm/s EDV cm/s Stenosis Plaque Description Comments            +----------+--------+--------+--------+------------------+------------------+  CCA Prox   104      22                                   intimal thickening  +----------+--------+--------+--------+------------------+------------------+  CCA Distal 86       21                                    intimal thickening  +----------+--------+--------+--------+------------------+------------------+  ICA Prox   76       18                heterogenous                           +----------+--------+--------+--------+------------------+------------------+  ICA Distal 79       19                                                       +----------+--------+--------+--------+------------------+------------------+  ECA        102      11                                                       +----------+--------+--------+--------+------------------+------------------+ +----------+--------+--------+--------+-------------------+             PSV cm/s EDV cm/s Describe Arm Pressure (mmHG)  +----------+--------+--------+--------+-------------------+  Subclavian 85                                              +----------+--------+--------+--------+-------------------+ +---------+--------+--+--------+--+  Vertebral PSV cm/s 47 EDV cm/s 12  +---------+--------+--+--------+--+   Summary: Right Carotid: Velocities in the right ICA are consistent with a 1-39% stenosis. Left Carotid: Velocities in the left ICA are consistent with a 1-39% stenosis. Vertebrals:  Bilateral vertebral arteries demonstrate antegrade flow. Subclavians: Normal flow hemodynamics were seen  in bilateral subclavian              arteries. *See table(s) above for measurements and observations.  Electronically signed by Servando Snare MD on 10/18/2019 at 10:46:09 AM.    Final      Medical Consultants:    None.  Anti-Infectives:   None  Subjective:    [name] [name] relates he continues to have abdominal pain but was eating this morning.  He denies any nausea or vomiting he does not remember when was his last bowel movement he is now passing gas.  Objective:    Vitals:   10/19/19 0100 10/19/19 0200 10/19/19 0238 10/19/19 0241  BP: 124/62 130/65  119/67  Pulse: (!) 59 (!) 57  65  Resp: 18 18  18   Temp:  99.3 F (37.4 C)  98.4 F (36.9  C)  TempSrc:  Oral    SpO2: 93% 93%  95%  Weight:   94 kg   Height:       SpO2: 95 %   Intake/Output Summary (Last 24 hours) at 10/19/2019 0748 Last data filed at 10/19/2019 0500 Gross per 24 hour  Intake 2059.55 ml  Output 1150 ml  Net 909.55 ml   Filed Weights   10/17/19 0506 10/18/19 0447 10/19/19 0238  Weight: 92 kg 91.7 kg 94 kg    Exam: General exam: In no acute distress. Respiratory system: Good air movement and clear to auscultation. Cardiovascular system: S1 & S2 heard, RRR. No JVD. Gastrointestinal system: Abdomen is significantly distended, his navel raising, some mild diffuse tenderness no rebound or guarding Central nervous system: Alert and oriented. No focal neurological deficits. Extremities: No pedal edema. Skin: No rashes, lesions or ulcers Psychiatry: Judgement and insight appear normal. Mood & affect appropriate.    Data Reviewed:    Labs: Basic Metabolic Panel: Recent Labs  Lab 10/15/19 0507 10/15/19 0507 10/15/19 1215 10/15/19 1215 10/16/19 0409 10/16/19 0409 10/18/19 0444 10/19/19 0541  NA 136  --  139  --  139  --  136 133*  K 3.7   < > 4.5   < > 4.2   < > 3.7 3.4*  CL 106  --  108  --  109  --  107 103  CO2 20*  --  20*  --  19*  --  19* 19*  GLUCOSE 212*  --  154*  --  111*  --  114* 103*  BUN 19  --  18  --  20  --  15 15  CREATININE 1.60*  --  1.58*  --  1.54*  --  1.38* 1.21  CALCIUM 8.9  --  8.7*  --  8.9  --  8.7* 8.4*  MG 2.1  --  2.0  --   --   --   --   --    < > = values in this interval not displayed.   GFR Estimated Creatinine Clearance: 62.7 mL/min (by C-G formula based on SCr of 1.21 mg/dL). Liver Function Tests: No results for input(s): AST, ALT, ALKPHOS, BILITOT, PROT, ALBUMIN in the last 168 hours. No results for input(s): LIPASE, AMYLASE in the last 168 hours. Recent Labs  Lab 10/17/19 1557  AMMONIA 14   Coagulation profile Recent Labs  Lab 10/15/19 0507 10/15/19 1215  INR 1.2 1.2   COVID-19 Labs  No  results for input(s): DDIMER, FERRITIN, LDH, CRP in the last 72 hours.  Lab Results  Component Value Date   SARSCOV2NAA  NEGATIVE 10/08/2019   Victor NEGATIVE 10/08/2019    CBC: Recent Labs  Lab 10/15/19 1215 10/16/19 0409 10/17/19 0405 10/18/19 0444 10/19/19 0541  WBC 7.8 10.7* 11.6* 12.0* 10.8*  HGB 10.4* 10.5* 9.5* 9.8* 9.6*  HCT 32.6* 32.4* 29.8* 30.7* 29.8*  MCV 93.1 91.0 92.3 91.9 91.7  PLT 234 210 190 182 204   Cardiac Enzymes: No results for input(s): CKTOTAL, CKMB, CKMBINDEX, TROPONINI in the last 168 hours. BNP (last 3 results) No results for input(s): PROBNP in the last 8760 hours. CBG: Recent Labs  Lab 10/18/19 0655 10/18/19 1134 10/18/19 1545 10/18/19 2115 10/19/19 0727  GLUCAP 104* 104* 130* 122* 124*   D-Dimer: No results for input(s): DDIMER in the last 72 hours. Hgb A1c: No results for input(s): HGBA1C in the last 72 hours. Lipid Profile: No results for input(s): CHOL, HDL, LDLCALC, TRIG, CHOLHDL, LDLDIRECT in the last 72 hours. Thyroid function studies: No results for input(s): TSH, T4TOTAL, T3FREE, THYROIDAB in the last 72 hours.  Invalid input(s): FREET3 Anemia work up: No results for input(s): VITAMINB12, FOLATE, FERRITIN, TIBC, IRON, RETICCTPCT in the last 72 hours. Sepsis Labs: Recent Labs  Lab 10/16/19 0409 10/17/19 0405 10/18/19 0444 10/19/19 0541  WBC 10.7* 11.6* 12.0* 10.8*   Microbiology Recent Results (from the past 240 hour(s))  Surgical PCR screen     Status: None   Collection Time: 10/15/19  6:28 AM   Specimen: Nasal Mucosa; Nasal Swab  Result Value Ref Range Status   MRSA, PCR NEGATIVE NEGATIVE Final   Staphylococcus aureus NEGATIVE NEGATIVE Final    Comment: (NOTE) The Xpert SA Assay (FDA approved for NASAL specimens in patients 95 years of age and older), is one component of a comprehensive surveillance program. It is not intended to diagnose infection nor to guide or monitor treatment. Performed at [facility], Seth Ward [address], [place], [place] [address]      Medications:    amLODipine  10 mg Oral Daily   atorvastatin  10 mg Oral q1800   carvedilol  25 mg Oral BID WC   Chlorhexidine Gluconate Cloth  6 each Topical Daily   gabapentin  300 mg Oral QHS   hydrALAZINE  50 mg Oral Q6H   hydrochlorothiazide  25 mg Oral Daily   influenza vaccine adjuvanted  0.5 mL Intramuscular Tomorrow-1000   insulin aspart  0-15 Units Subcutaneous TID WC   insulin aspart  0-5 Units Subcutaneous QHS   lidocaine  1 patch Transdermal Q24H   mupirocin ointment  1 application Nasal BID   pantoprazole  40 mg Oral Daily   pneumococcal 23 valent vaccine  0.5 mL Intramuscular Tomorrow-1000   sodium chloride flush  10-40 mL Intracatheter Q12H   sodium chloride flush  3 mL Intravenous Once   sodium chloride flush  3 mL Intravenous Q12H   tamsulosin  0.4 mg Oral Daily   Continuous Infusions:  sodium chloride     sodium chloride 75 mL/hr at 10/19/19 0305   heparin 1,800 Units/hr (10/19/19 0303)   magnesium sulfate bolus IVPB        LOS: 11 days   Charlynne Cousins  [facility]  10/19/2019, 7:48 AM

## 2019-10-19 NOTE — Progress Notes (Signed)
0145: Phone report to [facility].   0220: Transferred to 2W06 via monitored bed accompanied by RN. Belongings and chart accompanied pt.

## 2019-10-19 NOTE — Plan of Care (Signed)
Alert and oriented, knows he is in the hospital because of an aneurysm and aware of some difficulty with thinking/speaking. Reminded him that he has had a few embolic strokes and encouraged him to take his time in conversations. Delayed responses but conversant. Emotional support given.   Remains off labetalol gtt, on scheduled anti hypertensives, BP crept up earlier (SBP 150s) but responded to pain med/scheduled hydralazine po.   Temp 38.3, on scheduled tylenol.    Voiding. Abdomen distended, no BM despite prn dulcolax po on dayshift and miralax po tonight. + gas.   Assists with repositioning in bed. MAE. Heparin gtt and maintenance IV continue.   M/S orders, awaiting bed placement.   Problem: Education: Goal: Knowledge of General Education information will improve Description: Including pain rating scale, medication(s)/side effects and non-pharmacologic comfort measures Outcome: Progressing   Problem: Clinical Measurements: Goal: Ability to maintain clinical measurements within normal limits will improve Outcome: Progressing   Problem: Activity: Goal: Risk for activity intolerance will decrease Outcome: Progressing   Problem: Coping: Goal: Level of anxiety will decrease Outcome: Progressing   Problem: Elimination: Goal: Will not experience complications related to urinary retention Outcome: Progressing   Problem: Pain Managment: Goal: General experience of comfort will improve Outcome: Progressing   Problem: Safety: Goal: Ability to remain free from injury will improve Outcome: Progressing

## 2019-10-19 NOTE — Progress Notes (Signed)
STROKE TEAM PROGRESS NOTE   INTERVAL HISTORY Patient is lying comfortably in bed.  He has no complaints today.  MRI of the brain was obtained which was motion degraded and showed chronic left P1 stenosis with no major stenosis to explain his stroke  OBJECTIVE Vitals:   10/19/19 0241 10/19/19 0751 10/19/19 0825 10/19/19 0927  BP: 119/67 (!) 163/83  132/67  Pulse: 65 79  62  Resp: 18  (!) 26   Temp: 98.4 F (36.9 C)  98.4 F (36.9 C)   TempSrc:   Oral   SpO2: 95%  97%   Weight:      Height:        CBC:  Recent Labs  Lab 10/18/19 0444 10/19/19 0541  WBC 12.0* 10.8*  HGB 9.8* 9.6*  HCT 30.7* 29.8*  MCV 91.9 91.7  PLT 182 0000000    Basic Metabolic Panel:  Recent Labs  Lab 10/15/19 0507 10/15/19 0507 10/15/19 1215 10/16/19 0409 10/18/19 0444 10/19/19 0541  NA 136   < > 139   < > 136 133*  K 3.7   < > 4.5   < > 3.7 3.4*  CL 106   < > 108   < > 107 103  CO2 20*   < > 20*   < > 19* 19*  GLUCOSE 212*   < > 154*   < > 114* 103*  BUN 19   < > 18   < > 15 15  CREATININE 1.60*   < > 1.58*   < > 1.38* 1.21  CALCIUM 8.9   < > 8.7*   < > 8.7* 8.4*  MG 2.1  --  2.0  --   --   --    < > = values in this interval not displayed.    IMAGING past 24h DG Abd 1 View  Result Date: 10/18/2019 CLINICAL DATA:  Abdominal distention. EXAM: ABDOMEN - 1 VIEW COMPARISON:  10/16/2019 FINDINGS: Diffuse gaseous distention of small bowel and colon again. Noted cecum is more decompressed than on the prior study in there is gas visible in the left colon today. Thoracic aortic stent graft incompletely visualized. Patient is status post lower lumbar fusion. IMPRESSION: Mild gaseous distention of small bowel and colon. Imaging features are not frankly suggestive of an obstructive pattern and features may reflect ileus. Electronically Signed   By: Misty Stanley M.D.   On: 10/18/2019 11:01   MR ANGIO HEAD WO CONTRAST  Result Date: 10/18/2019 CLINICAL DATA:  Stroke follow-up. Embolic infarcts in the  setting of recent thoracic aortic dissection repair. EXAM: MRA HEAD WITHOUT CONTRAST TECHNIQUE: Angiographic images of the Circle of Willis were obtained using MRA technique without intravenous contrast. COMPARISON:  04/09/2010 FINDINGS: The study is mildly to moderately motion degraded. The visualized distal vertebral arteries are patent to the basilar with the left being strongly dominant. The basilar artery is widely patent. Posterior communicating arteries are diminutive or absent. PCAs are patent without evidence of significant proximal stenosis on the right. A left P1 stenosis is again seen, moderate on the prior MRA and not felt to have progressed within limitations of motion artifact today. The internal carotid arteries are patent from skull base to carotid termini without evidence of significant stenosis allowing for artifact. ACAs and MCAs are patent without evidence of proximal branch occlusion or significant proximal stenosis. No aneurysm is identified. IMPRESSION: 1. Motion degraded examination without medium or large vessel occlusion or significant proximal anterior circulation stenosis. 2.  Chronic moderate left P1 PCA stenosis. Electronically Signed   By: Logan Bores M.D.   On: 10/18/2019 17:19    PHYSICAL EXAM    Exam: NAD, pleasant but slightly distressed and anxious, states he does not know if he is ok                Speech:    Speech is normal; fluent and spontaneous with normal comprehension.No dysarthria.   Cognition:   Appears to have baseline cognitive deficits.    Cranial Nerves:    The pupils are equal, round, and reactive to light.Trigeminal sensation is intact and the muscles of mastication are normal. The face is symmetric. The palate elevates in the midline. Hearing intact. Voice is normal. Shoulder shrug is normal. The tongue has normal motion without fasciculations.   Coordination:  No dysmetria or ataxia noted on FTN  Motor Observation:    No asymmetry, no atrophy,  and no involuntary movements noted. Tone:    Normal muscle tone.     Strength:    Strength is V/V in the upper and lower limbs.      Sensation: intact to LT   ASSESSMENT/PLAN Mr. Freemon Reath Henigan is a 74 y.o. male with history of HTN, HLD, DM, chronic back pain, distal thoracic aneurysm admitted to [facility] 10/08/19 with elevated BP, leg weakness, chest and epigastric pain. 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 On 10/17/19 an MRI was performed to evaluate AMS. This revealed bilateral cerebellar and cerebral infarcts. He did not receive IV t-PA due to recent surgery, anticoagulation and minimal deficits.   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 Signs of generalized atrophy and chronic white matter disease.    MRI head - Small acute bilateral cerebral and cerebellar infarcts consistent with emboli. Moderate chronic small vessel ischemic disease.   MRA head no LVO.Marland Kitchen Chronic moderate left P1 PCA stenosis.   Carotid Doppler - unremarkable  2D Echo - EF 60 - 65%. No cardiac source of emboli identified.   Sars Corona Virus 2 - negative  LDL - 72  HgbA1c - 6.7  UDS - not ordered  VTE prophylaxis - SCDs ; Heparin IV  aspirin 81 mg daily prior to admission, now on heparin IV. AC at d/c will suffice for secondary stroke prevention  Therapy recommendations:  CIR  Disposition:  Pending  Nothing further from the stroke standpoint. Will sign off. Follow up in the Stroke Clinic 4 weeks from rehab d/c  B PE, incidental finding  On IV heparin  Plan DOAC when ok w/ VVS  Hypertension  Home BP meds: Norvasc ; Lotensin  HCT  Current BP meds: Norvasc ; Coreg ; Hydralazine ; HCTZ ; Labetalol ; metoprolol prn  Stable . BP goal < 140 per vascular surgery . Long-term BP goal normotensive  Hyperlipidemia  Home Lipid lowering medication: Crestor 10 mg daily  LDL 72, goal < 70  Current lipid lowering medication: Lipitor 10 mg daily  Recommend continuing home crestor at d/c for best statin treatment  Diabetes, controlled  Home diabetic meds: metformin  Current diabetic meds: SSI  HgbA1c 6.7, goal < 7.0 Recent Labs    10/18/19 1545 10/18/19 2115 10/19/19 0727  GLUCAP 130* 122* 124*    Other Stroke Risk Factors  Advanced age  Former cigarette smoker - quit  Overweight, Body mass index is 28.9 kg/m., recommend weight loss, diet and exercise as appropriate   Other Active Problems  Code status - Full Code  Ileus post op w/ abd pain, on opiods  Post op anemia - Hb 9.6  Mild leukocytosis - 10.8 (temp - 99)  CKD stage 3a - creatinine - 1.21  Non-anion gap acidosis   Hospital day # 11 .  Patient is presently on IV heparin as cannot be on oral agents due to ileus but once patient is able to swallow and aspirin alone may be enough from the stroke standpoint unless he needs to be on anticoagulation for his pulmonary embolism.  No further stroke work-up is necessary.  Continue statin at discharge and aggressive risk factor modification.  Stroke team will sign off.  Kindly call for questions discussed with Dr. Merlene Morse than 50% time during this 25-minute visit was spent in counseling and coordination of care and discussion with care team.  Antony Contras, MD  To contact Stroke Continuity provider, please refer to [email or web address] After hours, contact General Neurology

## 2019-10-19 NOTE — Progress Notes (Addendum)
Progress Note    10/19/2019 8:25 AM 4 Days Post-Op  Subjective: Complaining of mild abdominal pain secondary to distention.  He denies nausea or vomiting.  Is intermittently passing flatus.   Vitals:   10/19/19 0241 10/19/19 0751  BP: 119/67 (!) 163/83  Pulse: 65 79  Resp: 18   Temp: 98.4 F (36.9 C)   SpO2: 95%     Physical Exam: Cardiac: Rate rhythm and regular Lungs: Mild prolonged end expiratory effort. Extremities: 2+ palpable bilateral dorsalis pedis pulses.  Right groin dermatotomy inspected.  No ecchymosis or hematoma.  Skin glue intact. Abdomen: Abdomen is moderately distended and tense.  Tympanic percussion notes.  Active bowel sounds.  CBC    Component Value Date/Time   WBC 10.8 (H) 10/19/2019 0541   RBC 3.25 (L) 10/19/2019 0541   HGB 9.6 (L) 10/19/2019 0541   HCT 29.8 (L) 10/19/2019 0541   PLT 204 10/19/2019 0541   MCV 91.7 10/19/2019 0541   MCH 29.5 10/19/2019 0541   MCHC 32.2 10/19/2019 0541   RDW 14.7 10/19/2019 0541   LYMPHSABS 1.7 12/10/2014 1106   MONOABS 0.6 12/10/2014 1106   EOSABS 0.2 12/10/2014 1106   BASOSABS 0.0 12/10/2014 1106    BMET    Component Value Date/Time   NA 133 (L) 10/19/2019 0541   K 3.4 (L) 10/19/2019 0541   CL 103 10/19/2019 0541   CO2 19 (L) 10/19/2019 0541   GLUCOSE 103 (H) 10/19/2019 0541   BUN 15 10/19/2019 0541   CREATININE 1.21 10/19/2019 0541   CALCIUM 8.4 (L) 10/19/2019 0541   GFRNONAA 59 (L) 10/19/2019 0541   GFRAA >60 10/19/2019 0541     Intake/Output Summary (Last 24 hours) at 10/19/2019 0825 Last data filed at 10/19/2019 0500 Gross per 24 hour  Intake 2059.55 ml  Output 1150 ml  Net 909.55 ml    HOSPITAL MEDICATIONS Scheduled Meds: . amLODipine  10 mg Oral Daily  . atorvastatin  10 mg Oral q1800  . bisacodyl  10 mg Rectal Daily  . carvedilol  25 mg Oral BID WC  . Chlorhexidine Gluconate Cloth  6 each Topical Daily  . diatrizoate meglumine-sodium  90 mL Oral Once  . gabapentin  300 mg Oral QHS    . hydrALAZINE  10 mg Intravenous Q4H  . influenza vaccine adjuvanted  0.5 mL Intramuscular Tomorrow-1000  . insulin aspart  0-15 Units Subcutaneous TID WC  . insulin aspart  0-5 Units Subcutaneous QHS  . lidocaine  1 patch Transdermal Q24H  . mupirocin ointment  1 application Nasal BID  . pantoprazole  40 mg Oral Daily  . pneumococcal 23 valent vaccine  0.5 mL Intramuscular Tomorrow-1000  . sodium chloride flush  10-40 mL Intracatheter Q12H  . sodium chloride flush  3 mL Intravenous Once  . sodium chloride flush  3 mL Intravenous Q12H  . tamsulosin  0.4 mg Oral Daily   Continuous Infusions: . sodium chloride 75 mL/hr at 10/19/19 0305  . heparin 1,800 Units/hr (10/19/19 0303)  . magnesium sulfate bolus IVPB    . potassium chloride     PRN Meds:.alum & mag hydroxide-simeth, labetalol, magnesium sulfate bolus IVPB, metoprolol tartrate, morphine injection, ondansetron (ZOFRAN) IV, phenol, polyethylene glycol, sodium chloride flush, sodium chloride flush  Assessment:  74 y.o. male is s/p: thoracic endograft and for acute type B aortic dissection with IMH maximal aortic diameter 5.9 cm , right groin access.  Status post bilateral cerebellar infarcts and cerebral infarct.  Small bilateral pulmonary emboli, on  heparin infusion.  Hemoglobin stable. Currently with abdominal distention, likely ileus.    Plan: -N.p.o. for now.  Will transition to NOAC from IV heparin once he is able to tolerate p.o.  Discussed with attending RN.  Proceed with ambulating with assistance 4 Days Post-Op  -DVT prophylaxis: On heparin infusion   Risa Grill, PA-C [facility] [phone] 10/19/2019  8:25 AM   I have independently interviewed and examined patient and agree with PA assessment and plan above.  Appears to be progressing well.  Abdomen is persistently distended although possibly improved.  X-ray yesterday reviewed.  Okay for diet as per primary.  Dosha Broshears C. Donzetta Matters, MD [facility] [place] Office: [phone] Pager: [phone]

## 2019-10-19 NOTE — Progress Notes (Signed)
[place] for  heparin  Indication: PE  Allergies  Allergen Reactions  . Lyrica [Pregabalin] Nausea Only and Other (See Comments)    Hallucinations and dizziness, also  . Sulfa Antibiotics Other (See Comments)    Reaction not recalled     Patient Measurements: Height: 5\' 11"  (180.3 cm) Weight: 207 lb 3.7 oz (94 kg) IBW/kg (Calculated) : 75.3 Heparin Dosing Weight: 89.4 kg  Vital Signs: Temp: 98.4 F (36.9 C) (03/01 0825) Temp Source: Oral (03/01 0825) BP: 132/67 (03/01 0927) Pulse Rate: 62 (03/01 0927)  Labs: Recent Labs    10/17/19 0405 10/17/19 0505 10/17/19 0755 10/17/19 1749 10/17/19 1749 10/18/19 0444 10/18/19 1322 10/19/19 0541  HGB 9.5*   < >  --   --   --  9.8*  --  9.6*  HCT 29.8*  --   --   --   --  30.7*  --  29.8*  PLT 190  --   --   --   --  182  --  204  APTT  --   --  197* 134*  --  90*  --   --   HEPARINUNFRC  --    < >  --  0.63   < > 0.44 0.46 0.36  CREATININE  --   --   --   --   --  1.38*  --  1.21   < > = values in this interval not displayed.    Estimated Creatinine Clearance: 62.7 mL/min (by C-G formula based on SCr of 1.21 mg/dL).  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 Patient is not taking anticoagulation PTA. He was started on apixaban (last apixaban dose was 2/22 @ 9:30pm) but this was stopped for procedure  He is now s/p procedure and lumbar drain was removed. Heparin level this morning remains within goal at 0.36. Hgb 9.6, plt wnl. No bleeding or infusion issues per RN.    Goal of Therapy:  Heparin level 0.3-0.5 Monitor platelets by anticoagulation protocol: Yes   Plan:  -Continue heparin at 1800 units/hr -Heparin level and CBC daily -Monitor for bleeding  Albertina Parr, PharmD., BCPS Clinical Pharmacist Clinical phone for 10/19/19 until 5pm:  [phone]

## 2019-10-19 NOTE — Progress Notes (Signed)
Physical Therapy Treatment Patient Details Name: [name] [name] MRN: [id number] DOB: 11-20-1945 Today's Date: 10/19/2019    History of Present Illness 74 year old man with 10/10 lower chest and epigastric pain. Has been on/off for several weeks, with worst episode today. Has been complaining of back pain as well and is scheduled for spinal stenosis surgery but now states that now both the back and abdominal pains are simultaneous. 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    PT Comments    RN reported that pt had been up to the chair for about 15 minutes however stomach pain too much to bear. Pt suspected to have ileus and had 9/10 pain in abdomen on entry. Pt agreeable to bed level exercise. At end of exercises pt reports that he feels like he is ready to have BM. RN in room and went to get bed pan. D/c plans remain appropriate at this time. PT will continue to follow acutely.      Follow Up Recommendations  CIR;Supervision/Assistance - 24 hour     Equipment Recommendations  (defer to post-acute setting)       Precautions / Restrictions Precautions Precautions: Fall Restrictions Weight Bearing Restrictions: No          Cognition Arousal/Alertness: Awake/alert Behavior During Therapy: WFL for tasks assessed/performed Overall Cognitive Status: Impaired/Different from baseline Area of Impairment: Orientation;Memory;Following commands;Safety/judgement;Problem solving;Awareness                     Memory: Decreased recall of precautions;Decreased short-term memory Following Commands: Follows one step commands consistently;Follows multi-step commands with increased time Safety/Judgement: Decreased awareness of safety;Decreased awareness of deficits Awareness: Emergent Problem Solving: Slow processing;Decreased  initiation;Difficulty sequencing;Requires verbal cues;Requires tactile cues        Exercises General Exercises - Lower Extremity Ankle Circles/Pumps: AROM;Both;10 reps;Supine Quad Sets: 5 reps;AROM;Both Heel Slides: AROM;Both;5 reps;Supine Hip ABduction/ADduction: AROM;Both;5 reps;Supine Straight Leg Raises: AROM;Both;5 reps;Supine    General Comments General comments (skin integrity, edema, etc.): VSS on RA      Pertinent Vitals/Pain Pain Assessment: 0-10 Pain Score: 9  Pain Location: abdomen Pain Descriptors / Indicators: Aching Pain Intervention(s): Limited activity within patient's tolerance;Monitored during session;Repositioned           PT Goals (current goals can now be found in the care plan section) Acute Rehab PT Goals Patient Stated Goal: To return to baseline PT Goal Formulation: With patient Time For Goal Achievement: 10/30/19 Potential to Achieve Goals: Good Progress towards PT goals: Not progressing toward goals - comment(limited to pain)    Frequency    Min 4X/week      PT Plan Current plan remains appropriate       AM-PAC PT "6 Clicks" Mobility   Outcome Measure  Help needed turning from your back to your side while in a flat bed without using bedrails?: A Little Help needed moving from lying on your back to sitting on the side of a flat bed without using bedrails?: A Little Help needed moving to and from a bed to a chair (including a wheelchair)?: A Lot Help needed standing up from a chair using your arms (e.g., wheelchair or bedside chair)?: A Lot Help needed to walk in hospital room?: A Lot Help needed climbing 3-5 steps with a railing? : Total 6 Click Score: 13    End  of Session   Activity Tolerance: Patient limited by pain Patient left: in bed;with call bell/phone within reach;with bed alarm set;with nursing/sitter in room Nurse Communication: Other (comment)(feels like bowels are ready to move) PT Visit Diagnosis: Unsteadiness on  feet (R26.81);Muscle weakness (generalized) (M62.81)     Time: 1006-1020 PT Time Calculation (min) (ACUTE ONLY): 14 min  Charges:  $Therapeutic Exercise: 8-22 mins                     Ryan Palermo B. Migdalia Dk PT, DPT Acute Rehabilitation Services Pager [phone] Office [phone]    [place] 10/19/2019, 2:30 PM

## 2019-10-20 ENCOUNTER — Inpatient Hospital Stay (HOSPITAL_COMMUNITY): Payer: Medicare Other

## 2019-10-20 ENCOUNTER — Encounter (HOSPITAL_COMMUNITY): Payer: Self-pay | Admitting: Pulmonary Disease

## 2019-10-20 DIAGNOSIS — I2699 Other pulmonary embolism without acute cor pulmonale: Secondary | ICD-10-CM

## 2019-10-20 DIAGNOSIS — Z8679 Personal history of other diseases of the circulatory system: Secondary | ICD-10-CM

## 2019-10-20 DIAGNOSIS — R140 Abdominal distension (gaseous): Secondary | ICD-10-CM

## 2019-10-20 DIAGNOSIS — K567 Ileus, unspecified: Secondary | ICD-10-CM

## 2019-10-20 DIAGNOSIS — Z9889 Other specified postprocedural states: Secondary | ICD-10-CM

## 2019-10-20 DIAGNOSIS — I7103 Dissection of thoracoabdominal aorta: Principal | ICD-10-CM

## 2019-10-20 LAB — HEPARIN LEVEL (UNFRACTIONATED): Heparin Unfractionated: 0.51 IU/mL (ref 0.30–0.70)

## 2019-10-20 LAB — BASIC METABOLIC PANEL
Anion gap: 12 (ref 5–15)
BUN: 15 mg/dL (ref 8–23)
CO2: 16 mmol/L — ABNORMAL LOW (ref 22–32)
Calcium: 7.9 mg/dL — ABNORMAL LOW (ref 8.9–10.3)
Chloride: 112 mmol/L — ABNORMAL HIGH (ref 98–111)
Creatinine, Ser: 1.27 mg/dL — ABNORMAL HIGH (ref 0.61–1.24)
GFR calc Af Amer: >60 mL/min (ref >60)
GFR calc non Af Amer: 55 mL/min — ABNORMAL LOW (ref >60)
Glucose, Bld: 94 mg/dL (ref 70–99)
Potassium: 3.3 mmol/L — ABNORMAL LOW (ref 3.5–5.1)
Sodium: 140 mmol/L (ref 135–145)

## 2019-10-20 LAB — GLUCOSE, CAPILLARY
Glucose-Capillary: 139 mg/dL — ABNORMAL HIGH (ref 70–99)
Glucose-Capillary: 145 mg/dL — ABNORMAL HIGH (ref 70–99)
Glucose-Capillary: 149 mg/dL — ABNORMAL HIGH (ref 70–99)
Glucose-Capillary: 190 mg/dL — ABNORMAL HIGH (ref 70–99)
Glucose-Capillary: 90 mg/dL (ref 70–99)
Glucose-Capillary: 93 mg/dL (ref 70–99)
Glucose-Capillary: 99 mg/dL (ref 70–99)

## 2019-10-20 LAB — CBC
HCT: 27.8 % — ABNORMAL LOW (ref 39.0–52.0)
Hemoglobin: 8.8 g/dL — ABNORMAL LOW (ref 13.0–17.0)
MCH: 29 pg (ref 26.0–34.0)
MCHC: 31.7 g/dL (ref 30.0–36.0)
MCV: 91.7 fL (ref 80.0–100.0)
Platelets: 237 10*3/uL (ref 150–400)
RBC: 3.03 MIL/uL — ABNORMAL LOW (ref 4.22–5.81)
RDW: 14.9 % (ref 11.5–15.5)
WBC: 9.2 10*3/uL (ref 4.0–10.5)
nRBC: 0 % (ref 0.0–0.2)

## 2019-10-20 MED ORDER — POLYETHYLENE GLYCOL 3350 17 G PO PACK: 17.0000 g | PACK | Freq: Two times a day (BID) | ORAL | Status: DC

## 2019-10-20 MED ORDER — MAGNESIUM CITRATE PO SOLN
1.0000 | Freq: Once | ORAL | Status: AC
  Administered 2019-10-20: 1 via ORAL
  Filled 2019-10-20: qty 296

## 2019-10-20 MED ORDER — POLYETHYLENE GLYCOL 3350 17 G PO PACK
17.0000 g | PACK | Freq: Once | ORAL | Status: AC
  Administered 2019-10-20: 17 g via ORAL
  Filled 2019-10-20: qty 1

## 2019-10-20 MED ORDER — IPRATROPIUM-ALBUTEROL 0.5-2.5 (3) MG/3ML IN SOLN
3.0000 mL | RESPIRATORY_TRACT | Status: DC | PRN
  Filled 2019-10-20: qty 3

## 2019-10-20 MED ORDER — IPRATROPIUM-ALBUTEROL 0.5-2.5 (3) MG/3ML IN SOLN
3.0000 mL | RESPIRATORY_TRACT | Status: DC
  Administered 2019-10-20 – 2019-10-21 (×6): 3 mL via RESPIRATORY_TRACT
  Filled 2019-10-20 (×5): qty 3

## 2019-10-20 MED ORDER — POTASSIUM CHLORIDE CRYS ER 20 MEQ PO TBCR
40.0000 meq | EXTENDED_RELEASE_TABLET | Freq: Two times a day (BID) | ORAL | Status: AC
  Administered 2019-10-20 (×2): 40 meq via ORAL
  Filled 2019-10-20 (×2): qty 2

## 2019-10-20 MED ORDER — FUROSEMIDE 10 MG/ML IJ SOLN
40.0000 mg | Freq: Once | INTRAMUSCULAR | Status: AC
  Administered 2019-10-20: 40 mg via INTRAVENOUS
  Filled 2019-10-20: qty 4

## 2019-10-20 MED ORDER — ALBUTEROL SULFATE (2.5 MG/3ML) 0.083% IN NEBU
2.5000 mg | INHALATION_SOLUTION | Freq: Once | RESPIRATORY_TRACT | Status: AC | PRN
  Administered 2019-10-20: 2.5 mg via RESPIRATORY_TRACT
  Filled 2019-10-20: qty 3

## 2019-10-20 NOTE — PMR Pre-admission (Addendum)
PMR Admission Coordinator Pre-Admission Assessment  Patient: [name] [name] is an 74 y.o., male MRN: [id number] DOB: Mar 15, 1946 Height: 5' 10.98" (180.3 cm) Weight: 92.1 kg              Insurance Information HMO: yes    PPO:      PCP:      IPA:      80/20:      OTHER:  PRIMARY: U[employer] Medicare      Policy#: [id number]      Subscriber: patient CM Name: Tia     Phone#: did not provide ([facility]: [phone])    Fax#: [phone] Pre-Cert#: [id number]      Employer:  [employer]auf[employer] provided by Hebert Soho at [facility] for admit to CIR. Pt is approved for 7 days starting on 3/5 with next review date 3/11. Updated clinicals are to be faxed to (f): [phone] Benefits:  Phone #: online     Name: [email or web address] Eff. Date: 08/21/19- still active     Deduct: does not have ($0)      Out of Pocket Max: $4,500 ($155.00 met)      Life Max: NA CIR: $325/day co-pay for days 1-5, $0/day co-pay for days 6+      SNF: $0/day co-pay for days 1-20, $184/day co-pay for days 21-45, $0/day co-pay for days 46-100; limited to 100 days/cal yr Outpatient: $35/visit co-pay; limited by medical necessity     Home Health: 100% coverage, 0% co-insurance; limited by medical necessity DME: 80% coverage, 20% co-insurance  Providers:  SECONDARY: None      Policy#:       Subscriber:  CM Name:       Phone#:      Fax#:  Pre-Cert#:       Employer:  Benefits:  Phone #:      Name:  Eff. Date:      Deduct:       Out of Pocket Max:      Life Max:  CIR:       SNF: Outpatient:     Co-Pay:  Home Health:       Co-Pay:  DME:      Co-Pay:   Medicaid Application Date:       Case Manager:  Disability Application Date:       Case Worker:   The "Data Collection Information Summary" for patients in Inpatient Rehabilitation Facilities with attached "Privacy Act [place] Records" was provided and verbally reviewed with: Family  Emergency Contact Information Contact Information    Name Relation Home Work Mobile   Mashaw,[name] Spouse  [phone]  [phone]     Current Medical History  Patient Admitting Diagnosis: multiple embolic CVAs History of Present Illness: [name] [name]. [name] is a 74 year old male with history of chronic back pain with lumbar discectomy 2014 as well as anterior lateral lumbar fusion 2016, BPH, hypertension, diabetes mellitus, hyperlipidemia and remote tobacco abuse. Presented 10/08/2019 with lower chest and back pain with epigastric discomfort times several weeks as well as bouts of nausea and vomiting.  In the ED blood pressure 180s/130s.  EKG showed nonspecific ST-T changes not significantly different from prior EKG 2016.  First troponin HS 28, potassium 3.3, creatinine 1.25, WBC 14,100, hemoglobin 14.5, SARS coronavirus negative.  Chest x-ray no active disease.  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  Small pulmonary embolus involving the central right lobe pulmonary artery as  well as probable additional small subsegmental embolus in the right lower lobe.  Echocardiogram with ejection fraction 65%.  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 Donzetta Matters.  Placed on intravenous heparin therapy.  Neurology consulted 10/17/2019 for altered mental status.  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  Carotid Dopplers with no ICA stenosis.  Neurology follow-up and heparin was transitioned to Eliquis.  Hospital course blood loss anemia 9.3 and monitored with follow-up per GI services there was some fear of possible GI bleed related to intravenous heparin as he was transition to Eliquis no other overt bleeding noted..  Tolerating a regular consistency diet.  Patient is to be admitted for a comprehensive rehab program on 10/24/19.  Complete NIHSS TOTAL: 2 Glasgow Coma Scale Score: 15  Past Medical  History  Past Medical History:  Diagnosis Date  . Chronic back pain    stenosis  . Enlarged prostate   . GERD (gastroesophageal reflux disease)    occasionally will take a zantac(maybe once a month)  . History of colon polyps   . History of kidney stones   . History of stress test    done 10 yrs. ago, as a baseline   . Hyperlipidemia    takes Crestor daily  . Hypertension    takes Amlodipine daily and Lotensin as well    Family History  family history is not on file.  Prior Rehab/Hospitalizations:  Has the patient had prior rehab or hospitalizations prior to admission? No  Has the patient had major surgery during 100 days prior to admission? Yes  Current Medications   Current Facility-Administered Medications:  .  acetaminophen (TYLENOL) tablet 650 mg, 650 mg, Oral, Q6H PRN **OR** acetaminophen (TYLENOL) suppository 650 mg, 650 mg, Rectal, Q6H PRN, Lang Snow, FNP .  alum & mag hydroxide-simeth (MAALOX/MYLANTA) 200-200-20 MG/5ML suspension 15-30 mL, 15-30 mL, Oral, Q2H PRN, Rhyne, Samantha J, PA-C .  amLODipine (NORVASC) tablet 10 mg, 10 mg, Oral, Daily, Rhyne, Samantha J, PA-C, 10 mg at 10/23/19 1011 .  apixaban (ELIQUIS) tablet 5 mg, 5 mg, Oral, BID, Rolla Flatten, RPH, 5 mg at 10/23/19 1010 .  atorvastatin (LIPITOR) tablet 10 mg, 10 mg, Oral, q1800, Rhyne, Samantha J, PA-C, 10 mg at 10/23/19 1717 .  bisacodyl (DULCOLAX) suppository 10 mg, 10 mg, Rectal, Daily, Charlynne Cousins, MD, 10 mg at 10/23/19 1226 .  carvedilol (COREG) tablet 25 mg, 25 mg, Oral, BID WC, Rhyne, Samantha J, PA-C, 25 mg at 10/23/19 1706 .  Chlorhexidine Gluconate Cloth 2 % PADS 6 each, 6 each, Topical, Daily, Rhyne, Hulen Shouts, PA-C, 6 each at 10/23/19 1227 .  gabapentin (NEURONTIN) tablet 300 mg, 300 mg, Oral, QHS, Rhyne, Samantha J, PA-C, 300 mg at 10/22/19 2138 .  hydrALAZINE (APRESOLINE) injection 10 mg, 10 mg, Intravenous, Q4H, Charlynne Cousins, MD, 10 mg at 10/23/19 1707 .   influenza vaccine adjuvanted (FLUAD) injection 0.5 mL, 0.5 mL, Intramuscular, Tomorrow-1000, Agarwala, Ravi, MD .  insulin aspart (novoLOG) injection 0-15 Units, 0-15 Units, Subcutaneous, TID WC, Rhyne, Samantha J, PA-C, 2 Units at 10/23/19 1703 .  insulin aspart (novoLOG) injection 0-5 Units, 0-5 Units, Subcutaneous, QHS, Rhyne, Samantha J, PA-C .  ipratropium-albuterol (DUONEB) 0.5-2.5 (3) MG/3ML nebulizer solution 3 mL, 3 mL, Nebulization, BID, Charlynne Cousins, MD, 3 mL at 10/23/19 0713 .  labetalol (NORMODYNE) injection 10 mg, 10 mg, Intravenous, Q2H PRN, Charlynne Cousins, MD .  lidocaine (LIDODERM) 5 %  1 patch, 1 patch, Transdermal, Q24H, Agarwala, Ravi, MD, 1 patch at 10/23/19 1328 .  magnesium sulfate IVPB 2 g 50 mL, 2 g, Intravenous, Daily PRN, Rhyne, Samantha J, PA-C .  metoprolol tartrate (LOPRESSOR) injection 2-5 mg, 2-5 mg, Intravenous, Q2H PRN, Rhyne, Samantha J, PA-C .  morphine 2 MG/ML injection 2 mg, 2 mg, Intravenous, Q4H PRN, Charlynne Cousins, MD, 2 mg at 10/19/19 2220 .  ondansetron (ZOFRAN) injection 4 mg, 4 mg, Intravenous, Q6H PRN, Rhyne, Samantha J, PA-C, 4 mg at 10/15/19 1127 .  pantoprazole (PROTONIX) EC tablet 40 mg, 40 mg, Oral, Daily, Rhyne, Samantha J, PA-C, 40 mg at 10/23/19 1009 .  phenol (CHLORASEPTIC) mouth spray 1 spray, 1 spray, Mouth/Throat, PRN, Rhyne, Samantha J, PA-C .  pneumococcal 23 valent vaccine (PNEUMOVAX-23) injection 0.5 mL, 0.5 mL, Intramuscular, Tomorrow-1000, Agarwala, Ravi, MD .  sodium chloride flush (NS) 0.9 % injection 10-40 mL, 10-40 mL, Intracatheter, PRN, Rhyne, Samantha J, PA-C .  sodium chloride flush (NS) 0.9 % injection 10-40 mL, 10-40 mL, Intracatheter, Q12H, Rhyne, Samantha J, PA-C, 10 mL at 10/23/19 0909 .  sodium chloride flush (NS) 0.9 % injection 10-40 mL, 10-40 mL, Intracatheter, PRN, Rhyne, Samantha J, PA-C .  sodium chloride flush (NS) 0.9 % injection 3 mL, 3 mL, Intravenous, Once, Rhyne, Samantha J, PA-C .  sodium  chloride flush (NS) 0.9 % injection 3 mL, 3 mL, Intravenous, Q12H, Rhyne, Samantha J, PA-C, 3 mL at 10/23/19 0910 .  tamsulosin (FLOMAX) capsule 0.4 mg, 0.4 mg, Oral, Daily, Rhyne, Samantha J, PA-C, 0.4 mg at 10/23/19 1013  Patients Current Diet:  Diet Order            Diet - low sodium heart healthy        Diet Heart Room service appropriate? Yes; Fluid consistency: Thin  Diet effective now              Precautions / Restrictions Precautions Precautions: Fall Restrictions Weight Bearing Restrictions: No   Has the patient had 2 or more falls or a fall with injury in the past year?No  Prior Activity Level Community (5-7x/wk): retired, drives still, independent PTA. Did use RW in house but nothing out in the community  Prior Functional Level Prior Function Level of Independence: Independent Comments: pt likes to play electric guitar, sing and be with his grandchildren and great grandchildren  Self Care: Did the patient need help bathing, dressing, using the toilet or eating?  Independent  Indoor Mobility: Did the patient need assistance with walking from room to room (with or without device)? Independent  Stairs: Did the patient need assistance with internal or external stairs (with or without device)? Independent  Functional Cognition: Did the patient need help planning regular tasks such as shopping or remembering to take medications? Independent  Home Assistive Devices / Equipment Home Assistive Devices/Equipment: None Home Equipment: Environmental consultant - 2 wheels, Cane - single point, Transport chair  Prior Device Use: Indicate devices/aids used by the patient prior to current illness, exacerbation or injury? Walker  Current Functional Level Cognition  Overall Cognitive Status: Impaired/Different from baseline Current Attention Level: Sustained Orientation Level: Oriented X4 Following Commands: Follows one step commands with increased time Safety/Judgement: Decreased  awareness of safety, Decreased awareness of deficits General Comments: Pt pleasant and motivated to participate. Wife present. Increased processing time. Cues to stay on task. Continual sequencing cues.    Extremity Assessment (includes Sensation/Coordination)  Upper Extremity Assessment: RUE deficits/detail, LUE deficits/detail RUE Deficits / Details: 4-5  shoulder, 4+/5 elbow to hand LUE Deficits / Details: 4-/5 shoulder, 4/5 elbow to hand  Lower Extremity Assessment: Defer to PT evaluation    ADLs  Overall ADL's : Needs assistance/impaired Eating/Feeding: Set up, Bed level Grooming: Wash/dry hands, Wash/dry face, Brushing hair, Set up, Sitting Grooming Details (indicate cue type and reason): min assist for sitting balance Upper Body Bathing: Moderate assistance, Sitting Upper Body Bathing Details (indicate cue type and reason): assisted for back Lower Body Bathing: Sit to/from stand, Total assistance Upper Body Dressing : Minimal assistance, Sitting Lower Body Dressing: Total assistance, Sit to/from stand Toilet Transfer: Moderate assistance, RW Toileting- Clothing Manipulation and Hygiene: Moderate assistance, Sitting/lateral lean, Sit to/from stand Functional mobility during ADLs: Minimal assistance, Rolling walker, +2 for safety/equipment, Moderate assistance General ADL Comments: sitting balance interfering with ability to perform ADL in sitting, dependent on UEs for balance    Mobility  Overal bed mobility: Needs Assistance Bed Mobility: Supine to Sit Rolling: Mod assist Sidelying to sit: Mod assist Supine to sit: Mod assist, HOB elevated Sit to supine: Max assist General bed mobility comments: pt in chair upon OTs arrival    Transfers  Overall transfer level: Needs assistance Equipment used: Rolling walker (2 wheeled) Transfer via Lift Equipment: Stedy Transfers: Sit to/from Stand Sit to Stand: Mod assist General transfer comment: pulled up on sink    Ambulation /  Gait / Stairs / Wheelchair Mobility  Ambulation/Gait Ambulation/Gait assistance: Mod assist, +2 safety/equipment Gait Distance (Feet): 15 Feet(+10) Assistive device: Rolling walker (2 wheeled) Gait Pattern/deviations: Step-through pattern, Trunk flexed, Decreased stride length General Gait Details: assist for RW management and to stabilize balance. Continual sequencing cues. Increased overall flexion with fatigue. Ambulated 10' then 15' with RW. 3-minute seated rest break in between. +2 for close chair follow. Gait velocity: decreased Gait velocity interpretation: <1.31 ft/sec, indicative of household ambulator    Posture / Balance Dynamic Sitting Balance Sitting balance - Comments: posterior lean, verbal and tactile cuing for correcting posture successful but difficult for pt to sustain Balance Overall balance assessment: Needs assistance Sitting-balance support: Feet supported, Bilateral upper extremity supported Sitting balance-Leahy Scale: Fair Sitting balance - Comments: posterior lean, verbal and tactile cuing for correcting posture successful but difficult for pt to sustain Postural control: Posterior lean Standing balance support: Bilateral upper extremity supported, During functional activity Standing balance-Leahy Scale: Poor Standing balance comment: reliant on  B UE and external support    Special needs/care consideration BiPAP/CPAP: no prior history; did use Bipap on 3/2  CPM: no Continuous Drip IV: no Dialysis: no        Days: no Life Vest: no Oxygen: RA vs 2L Special Bed: no Trach Size: no Wound Vac (area): no      Location: no Skin: blister to Left upper arm, skin tear to mid anterior scrotum; closed incision to right groin          Bowel mgmt: last BM 10/21/19, incontinent Bladder mgmt: incontinent, external urinary catheter in place Diabetic mgmt: no Behavioral consideration : some slow processing, confusion  Chemo/radiation : no     Previous Home Environment  (from acute therapy documentation) Living Arrangements: Spouse/significant other Available Help at Discharge: Family, Available 24 hours/day Type of Home: House Home Layout: Two level, Able to live on main level with bedroom/bathroom Alternate Level Stairs-Rails: Right Alternate Level Stairs-Number of Steps: 7 x2 separated by landing Home Access: Level entry Bathroom Shower/Tub: Tub/shower unit, Walk-in shower(tub on ground level) Biochemist, clinical: Standard Home Care Services: No  Discharge Living Setting Plans for Discharge Living Setting: Patient's home, Lives with (comment)(wife) Type of Home at Discharge: House Discharge Home Layout: Two level(master upstairs, but full bed/bath downstairs as needed) Alternate Level Stairs-Rails: Right Alternate Level Stairs-Number of Steps: 7 steps, landing, 7 steps (14 tota) Discharge Home Access: Level entry Discharge Bathroom Shower/Tub: Tub/shower unit, Walk-in shower(walk in upstairs; tub shower downstaris) Discharge Bathroom Toilet: Standard Discharge Bathroom Accessibility: Yes How Accessible: Accessible via wheelchair, Accessible via walker(downstairs option) Does the patient have any problems obtaining your medications?: No  Social/Family/Support Systems Patient Roles: Spouse Contact Information: wife: Barbaraann Share [phone] Anticipated Caregiver: wife Barbaraann Share Anticipated Caregiver's Contact Information: see above Ability/Limitations of Caregiver: Min A Caregiver Availability: 24/7 Discharge Plan Discussed with Primary Caregiver: Yes(pt and wife) Is Caregiver In Agreement with Plan?: Yes Does Caregiver/Family have Issues with Lodging/Transportation while Pt is in Rehab?: No   Goals/Additional Needs Patient/Family Goal for Rehab: PT/OT: Mod I; SLP: Mod I and Supervision Expected length of stay: 10-14 days Cultural Considerations: Jewish faith  Dietary Needs: heart healthy, thin liquids.  Equipment Needs: TBD Pt/Family Agrees to  Admission and willing to participate: Yes Program Orientation Provided & Reviewed with Pt/Caregiver Including Roles  & Responsibilities: Yes(pt and his wife)  Barriers to Discharge: Home environment access/layout, Incontinence  Barriers to Discharge Comments: 14 steps to get to master bed/bath.    Decrease burden of Care through IP rehab admission: NA   Possible need for SNF placement upon discharge: Not anticipated; pt has good prognosis for functional gains through CIR. Pt has good family support and anticipate he can reach goals in a reasonable amount of time.    Patient Condition: This patient's medical and functional status has changed since the consult dated: 10/20/19 in which the Rehabilitation Physician determined and documented that the patient's condition is appropriate for intensive rehabilitative care in an inpatient rehabilitation facility. See "History of Present Illness" (above) for medical update. Functional changes are: slight improvement in ambulation distance from Mod A 10 feet to Mod A + 2 for 15 feet (+10). Patient's medical and functional status update has been discussed with the Rehabilitation physician and patient remains appropriate for inpatient rehabilitation. Will admit to inpatient rehab Saturday 10/24/19.  Preadmission Screen Completed By:  Raechel Ache, OT, 10/23/2019 5:25 PM ______________________________________________________________________   Discussed status with Dr. Ranell Patrick on 10/23/19 at 3:25PM and received approval for admission Saturday, 10/24/19.  Admission Coordinator:  Raechel Ache, time 5:23Pm/Date 10/23/19

## 2019-10-20 NOTE — Significant Event (Signed)
Rapid Response Event Note  Overview: Time Called: 1900 Arrival Time: 1900 Event Type: Respiratory  Initial Focused Assessment: Met day time RRT [place] at bedside, pt has audible expiratory wheezes. Albuterol ordered and given.   2100 came back to follow up with pt. Pt having increased work of breathing difficulty forming sentences SpO2 95% on RA, audible expiratory wheezes, RR 36, HR 88. Placed on 2L [place] for comfort which pt removed. Paged NP about order for lasix. 2300 U/O increased, WOB improved able to talk easier but still SOB, still having expiratory wheezes, SpO2 95 RA    Interventions: Albuterol treatment Stopped maintenance fluid of 55m/hr  Paged NP DSharlet Salina 40 lasix give once  Bipap Duoneb Q4  Plan of Care (if not transferred): Place pt on Bipap to help WOB, continue to monitor respiratory status, give breathing treatments for wheezing. Please call RRT for additional assistance.  Event Summary: Name of Physician Notified: MD DSharlet Salinaat 2120    at    Outcome: Stayed in room and stabalized     [employer]

## 2019-10-20 NOTE — Consult Note (Signed)
Physical Medicine and Rehabilitation Consult Reason for Consult: Altered mental status with weakness Referring Physician: [facility]   HPI: [name] [name] is a 74 y.o. right-handed male with history of chronic back pain with lumbar discectomy 2014 as well as anterior lateral lumbar fusion 2016, BPH, hypertension, hyperlipidemia, remote tobacco abuse.  Per chart review patient lives with spouse.  Independent prior to admission.  Two-level home bed and bath on main level.  Presented 10/08/2019 with lower chest/back pain and epigastric discomfort times several weeks as well as bouts of nausea and vomiting.  In the ED blood pressure 180s/130s.  EKG showed nonspecific ST-T changes not significantly different from prior EKG 2016.  First troponin HS 28, potassium 3.3, creatinine 1.25, WBC 14,100, SARS coronavirus negative.  Chest x-ray no active disease.  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  Small pulmonary embolus involving the central right upper lobe pulmonary artery as well as probable additional small subsegmental embolus in the right lower lobe.  Echocardiogram with ejection fraction of 65%.  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 Donzetta Matters.  Patient maintained on intravenous heparin.  Neurology consulted 10/17/2019 for altered mental status and weakness.  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  Carotid Dopplers with no ICA stenosis.  Neurology follow-up currently maintained on intravenous heparin awaiting plan to transition to aspirin.  Hospital course acute blood loss anemia 9.6.  Therapy evaluations completed with recommendations of physical medicine rehab consult.  LBM was many days ago- feeling very constipated, uncomfortable, and hasn't had results with PO meds  given. Also has condom cath- Had ileus dc'd 2/27,=. Wife notes had stridor since came out of ICU, not since after surgery- never had anything like this before.   Has a little pain currently.  Uses Mg citrate at home when can't have BM. Wife concerned that is slower since CVA.  Review of Systems  Constitutional: Negative for chills and fever.  HENT: Negative for hearing loss.   Eyes: Negative for blurred vision and double vision.  Respiratory: Positive for stridor. Negative for cough and shortness of breath.   Cardiovascular: Negative for chest pain and palpitations.  Gastrointestinal: Positive for nausea and vomiting.       Epigastric pain, GERD  Genitourinary: Positive for urgency. Negative for dysuria, flank pain and hematuria.  Musculoskeletal: Positive for back pain, joint pain and myalgias.  Skin: Negative for rash.  Neurological: Positive for weakness.  All other systems reviewed and are negative.  Past Medical History:  Diagnosis Date  . Chronic back pain    stenosis  . Enlarged prostate   . GERD (gastroesophageal reflux disease)    occasionally will take a zantac(maybe once a month)  . History of colon polyps   . History of kidney stones   . History of stress test    done 10 yrs. ago, as a baseline   . Hyperlipidemia    takes Crestor daily  . Hypertension    takes Amlodipine daily and Lotensin as well   Past Surgical History:  Procedure Laterality Date  . ANTERIOR LAT LUMBAR FUSION Left 12/15/2014   Procedure: ANTERIOR LATERAL LUMBAR FUSION 1 LEVEL;  Surgeon: Phylliss Bob, MD;  Location: [place];  Service: Orthopedics;  Laterality: Left;  Left sided lateral lumbar interbody fusion, lumbar 3-4, posterior spinal fusion,  lumbar 3-4 with instrumentation.  . COLONOSCOPY    . ESOPHAGOGASTRODUODENOSCOPY    . fatty tissue removed from stomach    . LUMBAR LAMINECTOMY/DECOMPRESSION MICRODISCECTOMY N/A 07/08/2013   Procedure: LUMBAR LAMINECTOMY/DECOMPRESSION MICRODISCECTOMY;   Surgeon: Sinclair Ship, MD;  Location: [place];  Service: Orthopedics;  Laterality: N/A;  Lumbar 3-4, lumbar 4-5 decompression  . right ankle surgery     as child   . THORACIC AORTIC ENDOVASCULAR STENT GRAFT N/A 10/15/2019   Procedure: THORACIC AORTIC ENDOVASCULAR STENT GRAFT;  Surgeon: Waynetta Sandy, MD;  Location: [place];  Service: Vascular;  Laterality: N/A;  . TONSILLECTOMY     as a child   History reviewed. No pertinent family history. Social History:  reports that he has quit smoking. His smoking use included cigarettes. He has a 10.00 pack-year smoking history. He has never used smokeless tobacco. He reports that he does not drink alcohol or use drugs. Allergies:  Allergies  Allergen Reactions  . Lyrica [Pregabalin] Nausea Only and Other (See Comments)    Hallucinations and dizziness, also  . Sulfa Antibiotics Other (See Comments)    Reaction not recalled    Medications Prior to Admission  Medication Sig Dispense Refill  . acetaminophen (TYLENOL) 500 MG tablet Take 1,000 mg by mouth every 6 (six) hours as needed for mild pain or headache.     Marland Kitchen amLODipine (NORVASC) 10 MG tablet Take 10 mg by mouth daily.     . Ascorbic Acid (VITAMIN C) 500 MG CAPS Take 500-1,000 mg by mouth daily.    Marland Kitchen aspirin EC 81 MG tablet Take 81 mg by mouth daily.    . benazepril-hydrochlorthiazide (LOTENSIN HCT) 20-25 MG per tablet Take 1 tablet by mouth at bedtime.     Marland Kitchen CALCIUM PO Take 1 tablet by mouth daily.    . Cyanocobalamin (VITAMIN B-12 PO) Take 1 tablet by mouth daily.    Marland Kitchen HYDROcodone-acetaminophen (NORCO/VICODIN) 5-325 MG tablet Take 1-2 tablets by mouth See admin instructions. Take one to two tablets by mouth every 8-12 hours as needed for pain    . meloxicam (MOBIC) 15 MG tablet Take 15 mg by mouth daily.    . metFORMIN (GLUCOPHAGE) 500 MG tablet Take 500 mg by mouth 2 (two) times daily.    . Multiple Vitamins-Minerals (MULTIVITAL PO) Take 1 tablet by mouth daily.    .  rosuvastatin (CRESTOR) 10 MG tablet Take 10 mg by mouth daily.      Home: Home Living Family/patient expects to be discharged to:: Private residence Living Arrangements: Spouse/significant other Available Help at Discharge: Family, Available 24 hours/day(temporary) Type of Home: House Home Access: Level entry Home Layout: Two level, Able to live on main level with bedroom/bathroom Alternate Level Stairs-Number of Steps: 7 x2 separated by landing Alternate Level Stairs-Rails: Right Bathroom Shower/Tub: Tub/shower unit, Walk-in shower(tub on ground level) Biochemist, clinical: Standard Home Equipment: Environmental consultant - 2 wheels, Ivor - single point, Industrial/product designer History: Prior Function Level of Independence: Independent Functional Status:  Mobility: Bed Mobility Overal bed mobility: Needs Assistance Bed Mobility: Sit to Supine Supine to sit: Mod assist Sit to supine: Min assist Transfers Overall transfer level: Needs assistance Equipment used: Rolling walker (2 wheeled) Transfers: Sit to/from Stand Sit to Stand: Mod assist General transfer comment: PT providing verbal cues for hand placement and tactile and verbal cues for forward rocking and lean Ambulation/Gait Ambulation/Gait assistance: Mod assist Gait Distance (Feet): 10 Feet(7', 10') Assistive device: Rolling walker (2 wheeled) Gait Pattern/deviations:  Step-to pattern, Decreased step length - right, Decreased step length - left General Gait Details: pt with short shuffling steps, increased knee flexion during stance phase bilaterally, and increased trunk flexion. Pt with impaired gait sequencing, improves mildly with visual cues for step length and tactile cues for knee and hip extension during gait cycle Gait velocity: reduced Gait velocity interpretation: <1.31 ft/sec, indicative of household ambulator    ADL:    Cognition: Cognition Overall Cognitive Status: Impaired/Different from baseline Orientation Level:  Oriented to [place], Oriented to place, Oriented to time, Disoriented to situation Cognition Arousal/Alertness: Awake/alert Behavior During Therapy: WFL for tasks assessed/performed Overall Cognitive Status: Impaired/Different from baseline Area of Impairment: Orientation, Memory, Following commands, Safety/judgement, Problem solving, Awareness Orientation Level: Disoriented to, Time Memory: Decreased recall of precautions, Decreased short-term memory Following Commands: Follows one step commands consistently, Follows multi-step commands with increased time Safety/Judgement: Decreased awareness of safety, Decreased awareness of deficits Awareness: Emergent Problem Solving: Slow processing, Decreased initiation, Difficulty sequencing, Requires verbal cues, Requires tactile cues  Blood pressure (!) 144/76, pulse 87, temperature 99.9 F (37.7 C), temperature source Oral, resp. rate 20, height 5\' 11"  (1.803 m), weight 94 kg, SpO2 97 %. Physical Exam  Nursing note and vitals reviewed. Constitutional: He appears well-developed and well-nourished.  Elderly male sitting up slightly in bed; wife at bedside; strong, progressive stridor while I was in room, no acute distress   HENT:  Head: Normocephalic and atraumatic.  Nose: Nose normal.  Mouth/Throat: Oropharynx is clear and moist.  No specific facial droop noted at rest- didn't want to smile Tongue midline and coated  Eyes: Pupils are equal, round, and reactive to light. Conjunctivae and EOM are normal.  Saw a little horizontal nystagmus when looking both ways- improved by second testing.  EOMI B/L  Cardiovascular:  RRR- borderline slow-   Respiratory: Stridor present.  Has significant stridor with every breath- also wheezing when listening to lung exam- exp wheezes- also coarse, adequate air movement; no Rhonchi/rales heard.  Stridor/wheezing audible. Also starting to use accessory muscles.   GI:  Very distended; firm; bowel sounds in  upper quadrants, but not in lower quadrants, esp not LLQ- quiet- no tinkling.  No rebound; NT  Genitourinary:    Genitourinary Comments: Has condom cath in place   Musculoskeletal:        General: No deformity. Normal range of motion.     Cervical back: Normal range of motion and neck supple.     Comments: Strength tested in biceps, triceps, WE, grip and Finger abd- 5-/5 in B/L UEs and in LEs, HF, KE, KF, DF and PF- also 5-/5 and B/L- However very slow to move/follow commands.   Neurological: Coordination normal.  Patient is a bit lethargic but arousable.  Follows commands.  Oriented x3. Follows commands however VERY slow to follow and slow to answer questions- answered but no spontaneous speech.  Speech long and drawn out- slow to process as well? Sensation intact in all 4 extremities and face No increased tone noted in all 4 extremities  Skin:  IV in B/L arms- no signs of infiltrate.  No skin breakdown seen.   Psychiatric:  Slow to comprehend and to process/and to follow commands- flat affect    Results for orders placed or performed during the hospital encounter of 10/08/19 (from the past 24 hour(s))  Glucose, capillary     Status: Abnormal   Collection Time: 10/19/19  7:27 AM  Result Value Ref Range   Glucose-Capillary 124 (H)  70 - 99 mg/dL  Lactate dehydrogenase     Status: Abnormal   Collection Time: 10/19/19  9:47 AM  Result Value Ref Range   LDH 225 (H) 98 - 192 U/L  Glucose, capillary     Status: Abnormal   Collection Time: 10/19/19 12:10 PM  Result Value Ref Range   Glucose-Capillary 118 (H) 70 - 99 mg/dL  Glucose, capillary     Status: Abnormal   Collection Time: 10/19/19  5:04 PM  Result Value Ref Range   Glucose-Capillary 108 (H) 70 - 99 mg/dL  Glucose, capillary     Status: None   Collection Time: 10/19/19  7:56 PM  Result Value Ref Range   Glucose-Capillary 94 70 - 99 mg/dL  Glucose, capillary     Status: None   Collection Time: 10/20/19  1:42 AM  Result  Value Ref Range   Glucose-Capillary 93 70 - 99 mg/dL  Glucose, capillary     Status: None   Collection Time: 10/20/19  4:33 AM  Result Value Ref Range   Glucose-Capillary 90 70 - 99 mg/dL   DG Abd 1 View  Result Date: 10/18/2019 CLINICAL DATA:  Abdominal distention. EXAM: ABDOMEN - 1 VIEW COMPARISON:  10/16/2019 FINDINGS: Diffuse gaseous distention of small bowel and colon again. Noted cecum is more decompressed than on the prior study in there is gas visible in the left colon today. Thoracic aortic stent graft incompletely visualized. Patient is status post lower lumbar fusion. IMPRESSION: Mild gaseous distention of small bowel and colon. Imaging features are not frankly suggestive of an obstructive pattern and features may reflect ileus. Electronically Signed   By: Misty Stanley M.D.   On: 10/18/2019 11:01   MR ANGIO HEAD WO CONTRAST  Result Date: 10/18/2019 CLINICAL DATA:  Stroke follow-up. 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 EXAM: MRA HEAD WITHOUT CONTRAST TECHNIQUE: Angiographic images of the Circle of Willis were obtained using MRA technique without intravenous contrast. COMPARISON:  04/09/2010 FINDINGS: The study is mildly to moderately motion degraded. The visualized distal vertebral arteries are patent to the basilar with the left being strongly dominant. The basilar artery is widely patent. Posterior communicating arteries are diminutive or absent. PCAs are patent without evidence of significant proximal stenosis on the right. A left P1 stenosis is again seen, moderate on the prior MRA and not felt to have progressed within limitations of motion artifact today. The internal carotid arteries are patent from skull base to carotid termini without evidence of significant stenosis allowing for artifact. ACAs and MCAs are patent without evidence of proximal branch occlusion or significant proximal stenosis. No aneurysm is identified. IMPRESSION: 1. Motion  degraded examination without medium or large vessel occlusion or significant proximal anterior circulation stenosis. 2. Chronic moderate left P1 PCA stenosis. Electronically Signed   By: Logan Bores M.D.   On: 10/18/2019 17:19   DG Abd Portable 1V-Small Bowel Obstruction Protocol-initial, 8 hr delay  Result Date: 10/19/2019 CLINICAL DATA:  Follow up small bowel obstruction EXAM: PORTABLE ABDOMEN - 1 VIEW COMPARISON:  10/18/2019 FINDINGS: Scattered large and small bowel gas is noted. Recently administered contrast material now lies almost entirely within the colon. A minimal amount in the distal small bowel is seen. Postsurgical changes are noted in the descending thoracic aorta and lower lumbar spine. No free air is seen. IMPRESSION: Recently administered contrast material now lies almost entirely within the colon. No obstructive changes are seen. Electronically Signed   By: Inez Catalina  M.D.   On: 10/19/2019 19:52     Assessment/Plan: Diagnosis: multiple embolic CVAs 1. Does the need for close, 24 hr/day medical supervision in concert with the patient's rehab needs make it unreasonable for this patient to be served in a less intensive setting? Yes 2. Co-Morbidities requiring supervision/potential complications: HTN, ABLA, DM with A1c of 6.7; slowed processing, bowel issues; stridor, aortic dissection 3. Due to bladder management, bowel management, safety, skin/wound care, disease management, medication administration and patient education, does the patient require 24 hr/day rehab nursing? Yes 4. Does the patient require coordinated care of a physician, rehab nurse, therapy disciplines of PT, OT and SLP to address physical and functional deficits in the context of the above medical diagnosis(es)? Yes Addressing deficits in the following areas: balance, endurance, locomotion, strength, transferring, bathing, dressing, feeding, grooming, toileting, cognition and psychosocial support 5. Can the patient  actively participate in an intensive therapy program of at least 3 hrs of therapy per day at least 5 days per week? Yes 6. The potential for patient to make measurable gains while on inpatient rehab is good 7. Anticipated functional outcomes upon discharge from inpatient rehab are modified independent  with PT, modified independent with OT, modified independent and supervision with SLP. 8. Estimated rehab length of stay to reach the above functional goals is: 10-14 days 9. Anticipated discharge destination: Home 10. Overall Rehab/Functional Prognosis: good  RECOMMENDATIONS: This patient's condition is appropriate for continued rehabilitative care in the following setting: CIR Patient has agreed to participate in recommended program. Potentially Note that insurance prior authorization may be required for reimbursement for recommended care.  Comment:  Embolic CVAs- in cerebral and cerebellar regions  1. Suggest Mg citrate and if needed, follow in 3-4 hours with enema or suppository to completely clean pt out- he's very full of stool/distended 2.  Suggest ENT to assess stridor if CXR is negative- I'm concerned esp about the timing of when it occurred- not just after surgery.   3. Pt slow to process- if in pain, suggest scheduling low dose tylenol to help so doesn't always have to ask.    Lavon Paganini Angiulli, PA-C 10/20/2019

## 2019-10-20 NOTE — Progress Notes (Signed)
Received request for dressing change. Discussed with RN, line maintenance is done by central line competent nurses on that unit.

## 2019-10-20 NOTE — Progress Notes (Signed)
[facility] PROGRESS NOTE    Progress Note  [name] [name]  [id number] DOB: 1946-07-19 DOA: 10/08/2019 PCP: Wenda Low, MD     Brief Narrative:   [name] [name] is an 74 y.o. male past medical history of BPH hypertension, nephrolithiasis and hyperlipidemia presents with lower chest and epigastric pain for several weeks intermittent.  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  Vascular surgery was consulted who recommended medical management and placed on beta-blocker.  Repeated CTA on 10/12/2019 however showed an aneurysmal dilation had increased to 5.9, so vascular surgery proceeded with vascular repair on 10/15/2019 with percutaneous endograft.  On 10/17/2019 the of the head showed right cerebellum and right occipital hypodensity suspicious for infarct.  MRI of the brain showed bilateral suborbital infarcts and cerebral infarction neurology was consulted.  Significant and studies: CTA CAP 2/18 - aneurysmal dilation of thoracic and abdominal aorta, small PE in RUL pulmonary artery and small subsegmental PE in RLL, soft tissue density in the left common iliac vein CT head 2/27 - Subtle are of gray-white differentiation loss in posterior right occipital lobe, no mass effect. Subtle hypodensity in right cerebellum,. Findings suspicious for acute to subacute infarct particularly in the right occipital lobe MR Brain 2/27 - small acute bilateral cerebral and cerebellar infarcts. 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 Goal blood pressure is to be less than [id number] systolic. Patient is currently on hydrochlorothiazide amlodipine Coreg hydralazine and labetalol as needed For pain he is currently on morphine and oxycodone, he was  continuing gabapentin.  Bilateral small pulmonary embolism seen on CT angio on 10/08/2019: This is probably an incidental finding, lower extremity Dopplers was negative for DVT. He is currently on IV heparin we will discuss with vascular when we could switch him to NOACs once he is able to take orals.  Bilateral cerebellar infarcts and cerebral infarct HgbA1c 6.7, started on statin therapy LDL 72 probably need to go on Crestor. MRI, MRA of the brain without contrast as above PT, OT they both recommended CIR. Carotid dopplers unremarkable Transthoracic Echo source of emboli identified.  With an EF of 60%. Is currently on IV heparin for PE we will need to switch him to a NOAC.  Acute kidney injury: Likely prerenal resolved with IV fluid hydration.  New ileus likely contributing to his abdominal pain in the setting of opioid use: He was started on MiraLAX and Dulcolax, abdominal x-ray done on 10/17/2019 is concerning for ileus.  Small bowel protocol showed that the contrast is going through lies mostly now in the colon. He relates that he is now passing gas has not had a bowel movement has been able to ambulate. We will allow him a regular diet and give him MiraLAX.  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  Non-anion gap metabolic acidosis: Likely prerenal currently on IV fluids creatinine is improving hopefully his potassium will continue to improve check an LDH and a lactic acid.  BPH: Continue Flomax.   DVT prophylaxis: Heparin Family Communication:none Disposition Plan/Barrier to D/C:   Code Status:     Code Status Orders  (From admission, onward)  Start     Ordered   10/08/19 2055  Full code  Continuous     10/08/19 2055        Code Status History    Date Active Date Inactive Code Status Order  ID Comments User Context   12/15/2014 2238 12/17/2014 1509 Full Code [id number]  Justice Britain, PA-C Inpatient   Advance Care Planning Activity        IV Access:    Peripheral IV   Procedures and diagnostic studies:   DG Abd 1 View  Result Date: 10/18/2019 CLINICAL DATA:  Abdominal distention. EXAM: ABDOMEN - 1 VIEW COMPARISON:  10/16/2019 FINDINGS: Diffuse gaseous distention of small bowel and colon again. Noted cecum is more decompressed than on the prior study in there is gas visible in the left colon today. Thoracic aortic stent graft incompletely visualized. Patient is status post lower lumbar fusion. IMPRESSION: Mild gaseous distention of small bowel and colon. Imaging features are not frankly suggestive of an obstructive pattern and features may reflect ileus. Electronically Signed   By: Misty Stanley M.D.   On: 10/18/2019 11:01   MR ANGIO HEAD WO CONTRAST  Result Date: 10/18/2019 CLINICAL DATA:  Stroke follow-up. 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 EXAM: MRA HEAD WITHOUT CONTRAST TECHNIQUE: Angiographic images of the Circle of Willis were obtained using MRA technique without intravenous contrast. COMPARISON:  04/09/2010 FINDINGS: The study is mildly to moderately motion degraded. The visualized distal vertebral arteries are patent to the basilar with the left being strongly dominant. The basilar artery is widely patent. Posterior communicating arteries are diminutive or absent. PCAs are patent without evidence of significant proximal stenosis on the right. A left P1 stenosis is again seen, moderate on the prior MRA and not felt to have progressed within limitations of motion artifact today. The internal carotid arteries are patent from skull base to carotid termini without evidence of significant stenosis allowing for artifact. ACAs and MCAs are patent without evidence of proximal branch occlusion or significant proximal stenosis. No aneurysm is  identified. IMPRESSION: 1. Motion degraded examination without medium or large vessel occlusion or significant proximal anterior circulation stenosis. 2. Chronic moderate left P1 PCA stenosis. Electronically Signed   By: Logan Bores M.D.   On: 10/18/2019 17:19   DG Abd Portable 1V-Small Bowel Obstruction Protocol-initial, 8 hr delay  Result Date: 10/19/2019 CLINICAL DATA:  Follow up small bowel obstruction EXAM: PORTABLE ABDOMEN - 1 VIEW COMPARISON:  10/18/2019 FINDINGS: Scattered large and small bowel gas is noted. Recently administered contrast material now lies almost entirely within the colon. A minimal amount in the distal small bowel is seen. Postsurgical changes are noted in the descending thoracic aorta and lower lumbar spine. No free air is seen. IMPRESSION: Recently administered contrast material now lies almost entirely within the colon. No obstructive changes are seen. Electronically Signed   By: Inez Catalina M.D.   On: 10/19/2019 19:52     Medical Consultants:    None.  Anti-Infectives:   None  Subjective:    Dearrius Hartford Mendosa relates no further abdominal pain he is currently passing gas.  Objective:    Vitals:   10/20/19 0132 10/20/19 0612 10/20/19 0613 10/20/19 0753  BP: (!) 144/76 (!) 143/70  134/76  Pulse: 87   89  Resp:    (!) 22  Temp: 99.9 F (37.7 C)   99.5 F (37.5 C)  TempSrc: Oral     SpO2: 97%   100%  Weight:   94.8 kg   Height:       SpO2: 100 %   Intake/Output Summary (Last 24 hours) at 10/20/2019 0756 Last data filed at 10/20/2019 [id number] Gross per 24 hour  Intake 1424.29 ml  Output 1875 ml  Net -450.71 ml   Filed Weights   10/18/19 0447 10/19/19 0238 10/20/19 0613  Weight: 91.7 kg 94 kg 94.8 kg    Exam: General exam: In no acute distress. Respiratory system: Good air movement and clear to auscultation. Cardiovascular system: S1 & S2 heard, RRR. No JVD. Gastrointestinal system: Abdomen is nondistended, soft and nontender.  Extremities: No  pedal edema. Skin: No rashes, lesions or ulcers   Data Reviewed:    Labs: Basic Metabolic Panel: Recent Labs  Lab 10/15/19 0507 10/15/19 0507 10/15/19 1215 10/15/19 1215 10/16/19 0409 10/16/19 0409 10/18/19 0444 10/18/19 0444 10/19/19 0541 10/20/19 0645  NA 136   < > 139  --  139  --  136  --  133* 140  K 3.7   < > 4.5   < > 4.2   < > 3.7   < > 3.4* 3.3*  CL 106   < > 108  --  109  --  107  --  103 112*  CO2 20*   < > 20*  --  19*  --  19*  --  19* 16*  GLUCOSE 212*   < > 154*  --  111*  --  114*  --  103* 94  BUN 19   < > 18  --  20  --  15  --  15 15  CREATININE 1.60*   < > 1.58*  --  1.54*  --  1.38*  --  1.21 1.27*  CALCIUM 8.9   < > 8.7*  --  8.9  --  8.7*  --  8.4* 7.9*  MG 2.1  --  2.0  --   --   --   --   --   --   --    < > = values in this interval not displayed.   GFR Estimated Creatinine Clearance: 60 mL/min (A) (by C-G formula based on SCr of 1.27 mg/dL (H)). Liver Function Tests: No results for input(s): AST, ALT, ALKPHOS, BILITOT, PROT, ALBUMIN in the last 168 hours. No results for input(s): LIPASE, AMYLASE in the last 168 hours. Recent Labs  Lab 10/17/19 1557  AMMONIA 14   Coagulation profile Recent Labs  Lab 10/15/19 0507 10/15/19 1215  INR 1.2 1.2   COVID-19 Labs  Recent Labs    10/19/19 0947  LDH 225*    Lab Results  Component Value Date   SARSCOV2NAA NEGATIVE 10/08/2019   [place] NEGATIVE 10/08/2019    CBC: Recent Labs  Lab 10/16/19 0409 10/17/19 0405 10/18/19 0444 10/19/19 0541 10/20/19 0645  WBC 10.7* 11.6* 12.0* 10.8* 9.2  HGB 10.5* 9.5* 9.8* 9.6* 8.8*  HCT 32.4* 29.8* 30.7* 29.8* 27.8*  MCV 91.0 92.3 91.9 91.7 91.7  PLT 210 190 182 204 237   Cardiac Enzymes: No results for input(s): CKTOTAL, CKMB, CKMBINDEX, TROPONINI in the last 168 hours. BNP (last 3 results) No results for input(s): PROBNP in the last 8760 hours. CBG: Recent Labs  Lab 10/19/19 1210 10/19/19 1704 10/19/19 1956 10/20/19 0142  10/20/19 0433  GLUCAP 118* 108* 94 93 90   D-Dimer: No results for input(s): DDIMER in the last 72 hours. Hgb A1c: No results for input(s): HGBA1C in the last 72  hours. Lipid Profile: No results for input(s): CHOL, HDL, LDLCALC, TRIG, CHOLHDL, LDLDIRECT in the last 72 hours. Thyroid function studies: No results for input(s): TSH, T4TOTAL, T3FREE, THYROIDAB in the last 72 hours.  Invalid input(s): FREET3 Anemia work up: No results for input(s): VITAMINB12, FOLATE, FERRITIN, TIBC, IRON, RETICCTPCT in the last 72 hours. Sepsis Labs: Recent Labs  Lab 10/17/19 0405 10/18/19 0444 10/19/19 0541 10/20/19 0645  WBC 11.6* 12.0* 10.8* 9.2   Microbiology Recent Results (from the past 240 hour(s))  Surgical PCR screen     Status: None   Collection Time: 10/15/19  6:28 AM   Specimen: Nasal Mucosa; Nasal Swab  Result Value Ref Range Status   MRSA, PCR NEGATIVE NEGATIVE Final   Staphylococcus aureus NEGATIVE NEGATIVE Final    Comment: (NOTE) The Xpert SA Assay (FDA approved for NASAL specimens in patients 17 years of age and older), is one component of a comprehensive surveillance program. It is not intended to diagnose infection nor to guide or monitor treatment. Performed at [facility], [place] [address] [place], [place] [address]      Medications:   . amLODipine  10 mg Oral Daily  . atorvastatin  10 mg Oral q1800  . bisacodyl  10 mg Rectal Daily  . carvedilol  25 mg Oral BID WC  . Chlorhexidine Gluconate Cloth  6 each Topical Daily  . gabapentin  300 mg Oral QHS  . hydrALAZINE  10 mg Intravenous Q4H  . influenza vaccine adjuvanted  0.5 mL Intramuscular Tomorrow-1000  . insulin aspart  0-15 Units Subcutaneous TID WC  . insulin aspart  0-5 Units Subcutaneous QHS  . lidocaine  1 patch Transdermal Q24H  . pantoprazole  40 mg Oral Daily  . pneumococcal 23 valent vaccine  0.5 mL Intramuscular Tomorrow-1000  . sodium chloride flush  10-40 mL Intracatheter Q12H  . sodium  chloride flush  3 mL Intravenous Once  . sodium chloride flush  3 mL Intravenous Q12H  . tamsulosin  0.4 mg Oral Daily   Continuous Infusions: . sodium chloride 75 mL/hr (10/19/19 2202)  . heparin 1,800 Units/hr (10/20/19 0444)  . magnesium sulfate bolus IVPB        LOS: 12 days   Charlynne Cousins  [facility]  10/20/2019, 7:56 AM

## 2019-10-20 NOTE — Progress Notes (Signed)
  Progress Note    10/20/2019 1:55 PM 5 Days Post-Op  Subjective: Not having complaints, apparently had 2 bowel movements apparently did have some bleeding per rectum  Vitals:   10/20/19 0753 10/20/19 1100  BP: 134/76 140/71  Pulse: 89 66  Resp: (!) 22 (!) 25  Temp: 99.5 F (37.5 C) 98.4 F (36.9 C)  SpO2: 100% 95%    Physical Exam: Awake and alert Nonlabored respirations Abdomen is soft with this morning although mildly distended Right groin soft without hematoma Bilateral posterior tibial pulses are palpable  CBC    Component Value Date/Time   WBC 9.2 10/20/2019 0645   RBC 3.03 (L) 10/20/2019 0645   HGB 8.8 (L) 10/20/2019 0645   HCT 27.8 (L) 10/20/2019 0645   PLT 237 10/20/2019 0645   MCV 91.7 10/20/2019 0645   MCH 29.0 10/20/2019 0645   MCHC 31.7 10/20/2019 0645   RDW 14.9 10/20/2019 0645   LYMPHSABS 1.7 12/10/2014 1106   MONOABS 0.6 12/10/2014 1106   EOSABS 0.2 12/10/2014 1106   BASOSABS 0.0 12/10/2014 1106    BMET    Component Value Date/Time   NA 140 10/20/2019 0645   K 3.3 (L) 10/20/2019 0645   CL 112 (H) 10/20/2019 0645   CO2 16 (L) 10/20/2019 0645   GLUCOSE 94 10/20/2019 0645   BUN 15 10/20/2019 0645   CREATININE 1.27 (H) 10/20/2019 0645   CALCIUM 7.9 (L) 10/20/2019 0645   GFRNONAA 55 (L) 10/20/2019 0645   GFRAA >60 10/20/2019 0645    INR    Component Value Date/Time   INR 1.2 10/15/2019 1215     Intake/Output Summary (Last 24 hours) at 10/20/2019 1355 Last data filed at 10/20/2019 0957 Gross per 24 hour  Intake 1024.29 ml  Output 2175 ml  Net -1150.71 ml     Assessment/plan:  74 y.o. male is s/p thoracic endograft for acute type B aortic dissection with IMH maximal diameter 5.9 cm.  This was complicated by CVA he is doing well.  Does have some abdominal distention although has had bowel function with concern for GI bleeding GI now following.  We will continue to follow while inpatient.  Navarre Diana C. Donzetta Matters, MD [facility] Office: [phone] Pager: [phone]  10/20/2019 1:55 PM

## 2019-10-20 NOTE — Consult Note (Signed)
Reason for Consult: Bright red blood per rectum Referring Physician: Hospital team  [name] [name] is an 74 y.o. male.  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 Chronic back pain    stenosis  . Enlarged prostate   . GERD (gastroesophageal reflux disease)    occasionally will take a zantac(maybe once a month)  . History of colon polyps   . History of kidney stones   . History of stress test    done 10 yrs. ago, as a baseline   . Hyperlipidemia    takes Crestor daily  . Hypertension    takes Amlodipine daily and Lotensin as well    Past Surgical History:  Procedure Laterality Date  . ANTERIOR LAT LUMBAR FUSION Left 12/15/2014   Procedure: ANTERIOR LATERAL LUMBAR FUSION 1 LEVEL;  Surgeon: Phylliss Bob, MD;  Location: [facility];  Service: Orthopedics;  Laterality: Left;  Left sided lateral lumbar interbody fusion, lumbar 3-4, posterior spinal fusion, lumbar 3-4 with instrumentation.  . COLONOSCOPY    . ESOPHAGOGASTRODUODENOSCOPY    . fatty tissue removed from stomach    . LUMBAR LAMINECTOMY/DECOMPRESSION MICRODISCECTOMY N/A 07/08/2013   Procedure: LUMBAR LAMINECTOMY/DECOMPRESSION MICRODISCECTOMY;  Surgeon: Sinclair Ship, MD;  Location: [facility];  Service: Orthopedics;  Laterality: N/A;  Lumbar 3-4, lumbar 4-5 decompression  . right ankle surgery     as child   .  THORACIC AORTIC ENDOVASCULAR STENT GRAFT N/A 10/15/2019   Procedure: THORACIC AORTIC ENDOVASCULAR STENT GRAFT;  Surgeon: Waynetta Sandy, MD;  Location: [facility];  Service: Vascular;  Laterality: N/A;  . TONSILLECTOMY     as a child    History reviewed. No pertinent family history.  Social History:  reports that he has quit smoking. His smoking use included cigarettes. He has a 10.00 pack-year smoking history. He has never used smokeless tobacco. He reports that he does not drink alcohol or use drugs.  Allergies:  Allergies  Allergen Reactions  . Lyrica [Pregabalin] Nausea Only and Other (See Comments)    Hallucinations and dizziness, also  . Sulfa Antibiotics Other (See Comments)    Reaction not recalled     Medications: I have reviewed the patient's current medications.  Results for orders placed or performed during the hospital encounter of 10/08/19 (from the past 48 hour(s))  Glucose, capillary     Status: Abnormal   Collection Time: 10/18/19  3:45 PM  Result Value Ref Range   Glucose-Capillary 130 (H) 70 - 99 mg/dL    Comment: Glucose reference range applies only to samples taken after fasting for at least 8 hours.  Glucose, capillary     Status: Abnormal   Collection Time: 10/18/19  9:15 PM  Result Value Ref Range   Glucose-Capillary 122 (H) 70 - 99 mg/dL    Comment: Glucose  reference range applies only to samples taken after fasting for at least 8 hours.  Heparin level (unfractionated)     Status: None   Collection Time: 10/19/19  5:41 AM  Result Value Ref Range   Heparin Unfractionated 0.36 0.30 - 0.70 IU/mL    Comment: (NOTE) If heparin results are below expected values, and patient dosage has  been confirmed, suggest follow up testing of antithrombin III levels. Performed at [facility], [place] [address] [place], [place] [address]   CBC     Status: Abnormal   Collection Time: 10/19/19  5:41 AM  Result Value Ref Range   WBC 10.8 (H) 4.0 - 10.5 K/uL    RBC 3.25 (L) 4.22 - 5.81 MIL/uL   Hemoglobin 9.6 (L) 13.0 - 17.0 g/dL   HCT 29.8 (L) 39.0 - 52.0 %   MCV 91.7 80.0 - 100.0 fL   MCH 29.5 26.0 - 34.0 pg   MCHC 32.2 30.0 - 36.0 g/dL   RDW 14.7 11.5 - 15.5 %   Platelets 204 150 - 400 K/uL   nRBC 0.0 0.0 - 0.2 %    Comment: Performed at [facility], [place] [address], [place], [place] [id number]  Basic metabolic panel     Status: Abnormal   Collection Time: 10/19/19  5:41 AM  Result Value Ref Range   Sodium 133 (L) 135 - 145 mmol/L   Potassium 3.4 (L) 3.5 - 5.1 mmol/L   Chloride 103 98 - 111 mmol/L   CO2 19 (L) 22 - 32 mmol/L   Glucose, Bld 103 (H) 70 - 99 mg/dL    Comment: Glucose reference range applies only to samples taken after fasting for at least 8 hours.   BUN 15 8 - 23 mg/dL   Creatinine, Ser 1.21 0.61 - 1.24 mg/dL   Calcium 8.4 (L) 8.9 - 10.3 mg/dL   GFR calc non Af Amer 59 (L) >60 mL/min   GFR calc Af Amer >60 >60 mL/min   Anion gap 11 5 - 15    Comment: Performed at [facility] [address] [place], [place] [address]  Glucose, capillary     Status: Abnormal   Collection Time: 10/19/19  7:27 AM  Result Value Ref Range   Glucose-Capillary 124 (H) 70 - 99 mg/dL    Comment: Glucose reference range applies only to samples taken after fasting for at least 8 hours.  Lactate dehydrogenase     Status: Abnormal   Collection Time: 10/19/19  9:47 AM  Result Value Ref Range   LDH 225 (H) 98 - 192 U/L    Comment: Performed at [facility] [address], [place], [place] [address]  Glucose, capillary     Status: Abnormal   Collection Time: 10/19/19 12:10 PM  Result Value Ref Range   Glucose-Capillary 118 (H) 70 - 99 mg/dL    Comment: Glucose reference range applies only to samples taken after fasting for at least 8 hours.  Glucose, capillary     Status: Abnormal   Collection Time: 10/19/19  5:04 PM  Result Value Ref Range   Glucose-Capillary 108 (H) 70 - 99 mg/dL    Comment: Glucose reference range applies  only to samples taken after fasting for at least 8 hours.  Glucose, capillary     Status: None   Collection Time: 10/19/19  7:56 PM  Result Value Ref Range   Glucose-Capillary 94 70 - 99 mg/dL    Comment:  Glucose reference range applies only to samples taken after fasting for at least 8 hours.  Glucose, capillary     Status: None   Collection Time: 10/20/19  1:42 AM  Result Value Ref Range   Glucose-Capillary 93 70 - 99 mg/dL    Comment: Glucose reference range applies only to samples taken after fasting for at least 8 hours.  Glucose, capillary     Status: None   Collection Time: 10/20/19  4:33 AM  Result Value Ref Range   Glucose-Capillary 90 70 - 99 mg/dL    Comment: Glucose reference range applies only to samples taken after fasting for at least 8 hours.  CBC     Status: Abnormal   Collection Time: 10/20/19  6:45 AM  Result Value Ref Range   WBC 9.2 4.0 - 10.5 K/uL   RBC 3.03 (L) 4.22 - 5.81 MIL/uL   Hemoglobin 8.8 (L) 13.0 - 17.0 g/dL   HCT 27.8 (L) 39.0 - 52.0 %   MCV 91.7 80.0 - 100.0 fL   MCH 29.0 26.0 - 34.0 pg   MCHC 31.7 30.0 - 36.0 g/dL   RDW 14.9 11.5 - 15.5 %   Platelets 237 150 - 400 K/uL   nRBC 0.0 0.0 - 0.2 %    Comment: Performed at [facility], [place] [address] [place], [place] [id number]  Basic metabolic panel     Status: Abnormal   Collection Time: 10/20/19  6:45 AM  Result Value Ref Range   Sodium 140 135 - 145 mmol/L   Potassium 3.3 (L) 3.5 - 5.1 mmol/L   Chloride 112 (H) 98 - 111 mmol/L   CO2 16 (L) 22 - 32 mmol/L   Glucose, Bld 94 70 - 99 mg/dL    Comment: Glucose reference range applies only to samples taken after fasting for at least 8 hours.   BUN 15 8 - 23 mg/dL   Creatinine, Ser 1.27 (H) 0.61 - 1.24 mg/dL   Calcium 7.9 (L) 8.9 - 10.3 mg/dL   GFR calc non Af Amer 55 (L) >60 mL/min   GFR calc Af Amer >60 >60 mL/min   Anion gap 12 5 - 15    Comment: Performed at [place] [facility] [address] [place], [place] [address]  Heparin level  (unfractionated)     Status: None   Collection Time: 10/20/19  6:46 AM  Result Value Ref Range   Heparin Unfractionated 0.51 0.30 - 0.70 IU/mL    Comment: (NOTE) If heparin results are below expected values, and patient dosage has  been confirmed, suggest follow up testing of antithrombin III levels. Performed at [facility], [place] [address], [place], [place] [address]   Glucose, capillary     Status: None   Collection Time: 10/20/19  7:49 AM  Result Value Ref Range   Glucose-Capillary 99 70 - 99 mg/dL    Comment: Glucose reference range applies only to samples taken after fasting for at least 8 hours.   Comment 1 QC Due   Glucose, capillary     Status: Abnormal   Collection Time: 10/20/19 12:07 PM  Result Value Ref Range   Glucose-Capillary 190 (H) 70 - 99 mg/dL    Comment: Glucose reference range applies only to samples taken after fasting for at least 8 hours.    MR ANGIO HEAD WO CONTRAST  Result Date: 10/18/2019 CLINICAL DATA:  Stroke follow-up. 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 EXAM:  MRA HEAD WITHOUT CONTRAST TECHNIQUE: Angiographic images of the Circle of Willis were obtained using MRA technique without intravenous contrast. COMPARISON:  04/09/2010 FINDINGS: The study is mildly to moderately motion degraded. The visualized distal vertebral arteries are patent to the basilar with the left being strongly dominant. The basilar artery is widely patent. Posterior communicating arteries are diminutive or absent. PCAs are patent without evidence of significant proximal stenosis on the right. A left P1 stenosis is again seen, moderate on the prior MRA and not felt to have progressed within limitations of motion artifact today. The internal carotid arteries are patent from skull base to carotid termini without evidence of significant stenosis allowing for artifact. ACAs and MCAs are patent without evidence of proximal branch occlusion or  significant proximal stenosis. No aneurysm is identified. IMPRESSION: 1. Motion degraded examination without medium or large vessel occlusion or significant proximal anterior circulation stenosis. 2. Chronic moderate left P1 PCA stenosis. Electronically Signed   By: Logan Bores M.D.   On: 10/18/2019 17:19   DG Abd Portable 1V-Small Bowel Obstruction Protocol-initial, 8 hr delay  Result Date: 10/19/2019 CLINICAL DATA:  Follow up small bowel obstruction EXAM: PORTABLE ABDOMEN - 1 VIEW COMPARISON:  10/18/2019 FINDINGS: Scattered large and small bowel gas is noted. Recently administered contrast material now lies almost entirely within the colon. A minimal amount in the distal small bowel is seen. Postsurgical changes are noted in the descending thoracic aorta and lower lumbar spine. No free air is seen. IMPRESSION: Recently administered contrast material now lies almost entirely within the colon. No obstructive changes are seen. Electronically Signed   By: Inez Catalina M.D.   On: 10/19/2019 19:52    Review of Systems negative except above Blood pressure 140/71, pulse 66, temperature 98.4 F (36.9 C), temperature source Oral, resp. rate (!) 25, height 5\' 11"  (1.803 m), weight 94.8 kg, SpO2 95 %. Physical Exam vital signs stable afebrile memory not very good since the stroke that he tells me he had with the aneurysm abdomen is soft nontender rare bowel sounds today's bowel movement seen by my PA BUN unchanged hemoglobin slight drop Assessment/Plan: Multiple medical problems and patient on blood thinners Plan: We will observe for now and will order a chest x-ray for shortness of breath and follow H&H and if signs of significant bleeding might need to stop blood thinners consider a filter and possibly a nuclear bleeding scan and will check on tomorrow  [facility] E 10/20/2019, 1:25 PM

## 2019-10-20 NOTE — Progress Notes (Signed)
[facility] for  heparin  Indication: PE  Allergies  Allergen Reactions  . Lyrica [Pregabalin] Nausea Only and Other (See Comments)    Hallucinations and dizziness, also  . Sulfa Antibiotics Other (See Comments)    Reaction not recalled     Patient Measurements: Height: 5\' 11"  (180.3 cm) Weight: 208 lb 15.9 oz (94.8 kg) IBW/kg (Calculated) : 75.3 Heparin Dosing Weight: 89.4 kg  Vital Signs: Temp: 99.5 F (37.5 C) (03/02 0753) Temp Source: Oral (03/02 0753) BP: 134/76 (03/02 0753) Pulse Rate: 89 (03/02 0753)  Labs: Recent Labs    10/17/19 1749 10/17/19 1749 10/18/19 0444 10/18/19 0444 10/18/19 1322 10/19/19 0541 10/20/19 0645 10/20/19 0646  HGB  --   --  9.8*   < >  --  9.6* 8.8*  --   HCT  --   --  30.7*  --   --  29.8* 27.8*  --   PLT  --   --  182  --   --  204 237  --   APTT 134*  --  90*  --   --   --   --   --   HEPARINUNFRC 0.63   < > 0.44   < > 0.46 0.36  --  0.51  CREATININE  --   --  1.38*  --   --  1.21 1.27*  --    < > = values in this interval not displayed.    Estimated Creatinine Clearance: 60 mL/min (A) (by C-G formula based on SCr of 1.27 mg/dL (H)).  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 Patient is not taking anticoagulation PTA. He was started on apixaban (last apixaban dose was 2/22 @ 9:30pm) but this was stopped for procedure  He is now s/p procedure and lumbar drain was removed. Heparin level this morning remains essentially therapeutic at 0.51. Hgb dropped from 9.6> 8.8, plt wnl. Per RN, patient had some blood in his stools. MD aware but planning to continue IV heparin for now with close monitoring. Vitals stable.    Goal of Therapy:  Heparin level 0.3-0.5 Monitor platelets by anticoagulation protocol: Yes   Plan:  -Continue heparin at 1800 units/hr.   -Heparin level and  CBC daily -Monitor for bleeding -Planning to transition to a DOAC once able to tolerate orals   Albertina Parr, PharmD., BCPS Clinical Pharmacist Clinical phone for 10/20/19 until 5pm: [phone]

## 2019-10-20 NOTE — Progress Notes (Signed)
Inpatient Rehab Admissions:  Inpatient Rehab Consult received.  I met with pt and his wife at the bedside for rehabilitation assessment and to discuss goals and expectations of an inpatient rehab admission. Pt noted to have abdominal discomfort and increased RR rate (per pt and his wife, RN and MDs aware-also noted GI requested chest x-ray for SOB).   We discussed program details, expected LOS, and anticipated assist at DC. Pt and wife are interested in IP Rehab program once medically ready. As workup still underway, [facility] will follow along for possible admit.   Raechel Ache, OTR/L  Rehab Admissions Coordinator  [phone] 10/20/2019 5:22 PM

## 2019-10-20 NOTE — Progress Notes (Addendum)
Patient has increased work of breathing. Dyspnea at rest and belly breathing with pursed lips. He also states he feels dizzy. Lung diminished and expiratory wheezing throughout. R-30, 140/71, T-98.4, P-67. MD made aware and orders received.

## 2019-10-20 NOTE — Progress Notes (Signed)
Rapid Response Event Note  Overview: Called at 1500 for pt having increased work of breathing. Pt has been experiencing irregular bowel habits this admission and was prescribed Magnesium citrate and Miralax. RN noted pt to have belly breathing. Oxygen saturation sustained >92% on room air. Per RN, MD was notified and orders received. RR RN unable to come to bedside and assess pt due to tending to another emergency. RR RN called primary RN at 1600 and RN stated that pt breathing had improved and that he was able to a bowel movement.  Rounded on pt at 1900, pt found to have expiratory wheezing. Oxygen saturation 98% on room air, RR 26. Pt warm, moist to touch. Albuterol neb treatment ordered. Hand-off report given to night shift RR RN at bedside.   [name] [name]

## 2019-10-21 ENCOUNTER — Inpatient Hospital Stay (HOSPITAL_COMMUNITY): Payer: Medicare Other

## 2019-10-21 LAB — CBC
HCT: 29 % — ABNORMAL LOW (ref 39.0–52.0)
Hemoglobin: 9.4 g/dL — ABNORMAL LOW (ref 13.0–17.0)
MCH: 29.4 pg (ref 26.0–34.0)
MCHC: 32.4 g/dL (ref 30.0–36.0)
MCV: 90.6 fL (ref 80.0–100.0)
Platelets: 228 10*3/uL (ref 150–400)
RBC: 3.2 MIL/uL — ABNORMAL LOW (ref 4.22–5.81)
RDW: 14.9 % (ref 11.5–15.5)
WBC: 9.9 10*3/uL (ref 4.0–10.5)
nRBC: 0 % (ref 0.0–0.2)

## 2019-10-21 LAB — BASIC METABOLIC PANEL
Anion gap: 9 (ref 5–15)
BUN: 14 mg/dL (ref 8–23)
CO2: 22 mmol/L (ref 22–32)
Calcium: 8.2 mg/dL — ABNORMAL LOW (ref 8.9–10.3)
Chloride: 110 mmol/L (ref 98–111)
Creatinine, Ser: 1.1 mg/dL (ref 0.61–1.24)
GFR calc Af Amer: >60 mL/min (ref >60)
GFR calc non Af Amer: >60 mL/min (ref >60)
Glucose, Bld: 139 mg/dL — ABNORMAL HIGH (ref 70–99)
Potassium: 3.7 mmol/L (ref 3.5–5.1)
Sodium: 141 mmol/L (ref 135–145)

## 2019-10-21 LAB — HEPARIN LEVEL (UNFRACTIONATED): Heparin Unfractionated: 0.22 IU/mL — ABNORMAL LOW (ref 0.30–0.70)

## 2019-10-21 LAB — GLUCOSE, CAPILLARY
Glucose-Capillary: 126 mg/dL — ABNORMAL HIGH (ref 70–99)
Glucose-Capillary: 127 mg/dL — ABNORMAL HIGH (ref 70–99)
Glucose-Capillary: 134 mg/dL — ABNORMAL HIGH (ref 70–99)
Glucose-Capillary: 190 mg/dL — ABNORMAL HIGH (ref 70–99)
Glucose-Capillary: 143 mg/dL — ABNORMAL HIGH (ref 70–99)
Glucose-Capillary: 126 mg/dL — ABNORMAL HIGH (ref 70–99)

## 2019-10-21 NOTE — Progress Notes (Signed)
  Progress Note    10/21/2019 8:15 AM 6 Days Post-Op  Subjective:  No overnight issues  Vitals:   10/21/19 0346 10/21/19 0600  BP:  135/76  Pulse: 76   Resp: (!) 26   Temp:    SpO2: 98%     Physical Exam: Awake and alert Non labored respirations Abdomen is softer 2+ bilateral pt  CBC    Component Value Date/Time   WBC 9.9 10/21/2019 0339   RBC 3.20 (L) 10/21/2019 0339   HGB 9.4 (L) 10/21/2019 0339   HCT 29.0 (L) 10/21/2019 0339   PLT 228 10/21/2019 0339   MCV 90.6 10/21/2019 0339   MCH 29.4 10/21/2019 0339   MCHC 32.4 10/21/2019 0339   RDW 14.9 10/21/2019 0339   LYMPHSABS 1.7 12/10/2014 1106   MONOABS 0.6 12/10/2014 1106   EOSABS 0.2 12/10/2014 1106   BASOSABS 0.0 12/10/2014 1106    BMET    Component Value Date/Time   NA 141 10/21/2019 0339   K 3.7 10/21/2019 0339   CL 110 10/21/2019 0339   CO2 22 10/21/2019 0339   GLUCOSE 139 (H) 10/21/2019 0339   BUN 14 10/21/2019 0339   CREATININE 1.10 10/21/2019 0339   CALCIUM 8.2 (L) 10/21/2019 0339   GFRNONAA >60 10/21/2019 0339   GFRAA >60 10/21/2019 0339    INR    Component Value Date/Time   INR 1.2 10/15/2019 1215     Intake/Output Summary (Last 24 hours) at 10/21/2019 0815 Last data filed at 10/21/2019 0457 Gross per 24 hour  Intake --  Output 2425 ml  Net -2425 ml     Assessment:  74 y.o. male is s/p TEVAR  Plan: Progressing well from vascular standpoint Ok for [employer] C. Donzetta Matters, MD [facility] Office: [phone] Pager: [phone]  10/21/2019 8:15 AM

## 2019-10-21 NOTE — Progress Notes (Signed)
Inpatient Rehabilitation-Admissions Coordinator   Met with pt and his wife bedside this afternoon. Pt appears to be feeling much better with less work of breathing today. Pt and wife still interested in CIR. AC still waiting on completion of medical workup and OT evaluation prior to beginning insurance authorization process for possible admission. Will continue to follow along.   Raechel Ache, OTR/L  Rehab Admissions Coordinator  [phone] 10/21/2019 1:34 PM

## 2019-10-21 NOTE — Progress Notes (Signed)
Came to room to check on pt/bipap.  Pt was already on 2 lpm [place] when I came in room.  No distress currently noted.  Pt states his breathing is "okay".  I asked pt if he felt he needed bipap, per pt "not right now".  VSS currently.

## 2019-10-21 NOTE — Progress Notes (Signed)
Physical Therapy Treatment Patient Details Name: [name] [name] MRN: [id number] DOB: 1946-05-10 Today's Date: 10/21/2019    History of Present Illness 74 year old man with 10/10 lower chest and epigastric pain. Has been on/off for several weeks, with worst episode today. Has been complaining of back pain as well and is scheduled for spinal stenosis surgery but now states that now both the back and abdominal pains are simultaneous. 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    PT Comments    Pt eager to participate in PT today, although pt is tachypneic with "laborious" breathing on 2LO2. Pt struggled with both sitting and standing balance today, with heavy posterior leaning present for both requiring mod assist from PT to correct. Pt continues to require mod assist for transfer training, his main issue being gaining balance upon standing and finding his CoM. PT to continue to work on mobility progression, will continue to follow acutely.    Follow Up Recommendations  CIR;Supervision/Assistance - 24 hour     Equipment Recommendations  (defer to post-acute setting)    Recommendations for Other Services       Precautions / Restrictions Precautions Precautions: Fall Restrictions Weight Bearing Restrictions: No    Mobility  Bed Mobility Overal bed mobility: Needs Assistance Bed Mobility: Sit to Supine     Supine to sit: Mod assist;HOB elevated     General bed mobility comments: Mod assist for LE lifting and translation to EOB, scooting to EOB with use of bed pad and pt UEs. Heavy posterior leaning corrected with use of bedrails to maintain sit.  Transfers Overall transfer level: Needs assistance Equipment used: Rolling walker (2 wheeled);1 Cressy hand held assist Transfers: Sit to/from Stand Sit to Stand: Mod assist;From  elevated surface         General transfer comment: Mod assist for power up, hip extension to neutral. Heavy posterior leaning in standing, first attempt with PT HHA and steadying but unsuccessful requiring return to sit due to heavy posterior lean. Second attempt with RW more successful, with continued posterior leaning requiring mod assist at gait belt and cuing for mild hip flexion to break extensor preference. mod assist for stand pivot to recliner to correcting posterior leaning, steadying, and guiding pt/RW to chair.  Ambulation/Gait             General Gait Details: not attempted today   Stairs             Wheelchair Mobility    Modified Rankin (Stroke Patients Only)       Balance Overall balance assessment: Needs assistance Sitting-balance support: Bilateral upper extremity supported;Feet supported Sitting balance-Leahy Scale: Poor Sitting balance - Comments: heavy posterior leaning requiring bilateral UE support and min-mod truncal assist from PT to correct; EOB sitting x10 minutes to work on finding CoM, balance Postural control: Posterior lean Standing balance support: Bilateral upper extremity supported;During functional activity Standing balance-Leahy Scale: Poor Standing balance comment: heavy extensor preference, requiring mod assist to correct                            Cognition Arousal/Alertness: Awake/alert Behavior During Therapy: WFL for tasks assessed/performed Overall Cognitive Status: Impaired/Different from baseline Area of Impairment: Orientation;Memory;Following commands;Safety/judgement;Problem solving;Awareness;Attention  Orientation Level: Disoriented to;Situation Current Attention Level: Sustained Memory: Decreased short-term memory Following Commands: Follows one step commands consistently;Follows multi-step commands with increased time Safety/Judgement: Decreased awareness of safety;Decreased  awareness of deficits Awareness: Emergent Problem Solving: Slow processing;Decreased initiation;Difficulty sequencing;Requires verbal cues;Requires tactile cues General Comments: Pt reports he does not know he has infarcts, states "why is it so hard for me to get up?". Follows commands well, with increased time and multimodal cuing. Slowed, labored speech      Exercises General Exercises - Lower Extremity Hip Flexion/Marching: AROM;Both;5 reps;Standing    General Comments General comments (skin integrity, edema, etc.): SpO2 95% and greater on 2LO2, HRmax 126 bpm, DOE 3/4      Pertinent Vitals/Pain Pain Assessment: No/denies pain Pain Score: 0-No pain Pain Location: "just laborious breathing" Pain Intervention(s): Limited activity within patient's tolerance;Monitored during session    Home Living                      Prior Function            PT Goals (current goals can now be found in the care plan section) Acute Rehab PT Goals Patient Stated Goal: To return to baseline PT Goal Formulation: With patient Time For Goal Achievement: 10/30/19 Potential to Achieve Goals: Good Progress towards PT goals: Progressing toward goals    Frequency    Min 4X/week      PT Plan Current plan remains appropriate    Co-evaluation              AM-PAC PT "6 Clicks" Mobility   Outcome Measure  Help needed turning from your back to your side while in a flat bed without using bedrails?: A Lot Help needed moving from lying on your back to sitting on the side of a flat bed without using bedrails?: A Lot Help needed moving to and from a bed to a chair (including a wheelchair)?: A Lot Help needed standing up from a chair using your arms (e.g., wheelchair or bedside chair)?: A Lot Help needed to walk in hospital room?: A Lot Help needed climbing 3-5 steps with a railing? : Total 6 Click Score: 11    End of Session Equipment Utilized During Treatment: Gait belt Activity  Tolerance: Patient limited by fatigue;Other (comment)(DOE) Patient left: with call bell/phone within reach;in chair;with chair alarm set;with family/visitor present Nurse Communication: Mobility status(+2 for back to bed, heavy posterior leaning today) PT Visit Diagnosis: Unsteadiness on feet (R26.81);Muscle weakness (generalized) (M62.81)     Time: [id number] PT Time Calculation (min) (ACUTE ONLY): 37 min  Charges:  $Therapeutic Activity: 8-22 mins $Neuromuscular Re-education: 8-22 mins                     Quirino Kakos E, PT Acute Rehabilitation Services Pager [phone]  Office [phone]   Karisa Nesser D Karletta Millay 10/21/2019, 2:11 PM

## 2019-10-21 NOTE — Progress Notes (Signed)
[name] [name] 4:54 PM  Subjective: Patient doing okay for a GI standpoint and has not seen any further bleeding and did have 2 bowel movements today and is actually breathing a little better and no new complaints and case discussed with his wife as well  Objective: Vital signs stable afebrile no acute distress abdomen is soft slight distended nontender rare bowel sounds BUN and creatinine okay hemoglobin stable  Assessment: Multiple medical problems including seemingly resolved bright red blood per rectum and resolved postop ileus  Plan: We will asked my partner Dr. Cristina Gong to check on tomorrow and please call us sooner if any question or problem that we can be of help with  [facility]  office [phone] After 5PM or if no answer call [phone]

## 2019-10-21 NOTE — Progress Notes (Signed)
[facility] PROGRESS NOTE    Progress Note  [name] [name]  [id number] DOB: Feb 07, 1946 DOA: 10/08/2019 PCP: Wenda Low, MD     Brief Narrative:   [name] [name] is an 74 y.o. male past medical history of BPH hypertension, nephrolithiasis and hyperlipidemia presents with lower chest and epigastric pain for several weeks intermittent.  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  Vascular surgery was consulted who recommended medical management and placed on beta-blocker.  Repeated CTA on 10/12/2019 however showed an aneurysmal dilation had increased to 5.9, so vascular surgery proceeded with vascular repair on 10/15/2019 with percutaneous endograft.  On 10/17/2019 the of the head showed right cerebellum and right occipital hypodensity suspicious for infarct.  MRI of the brain showed bilateral suborbital infarcts and cerebral infarction neurology was consulted.  Significant and studies: CTA CAP 2/18 - aneurysmal dilation of thoracic and abdominal aorta, small PE in RUL pulmonary artery and small subsegmental PE in RLL, soft tissue density in the left common iliac vein CT head 2/27 - Subtle are of gray-white differentiation loss in posterior right occipital lobe, no mass effect. Subtle hypodensity in right cerebellum,. Findings suspicious for acute to subacute infarct particularly in the right occipital lobe MR Brain 2/27 - small acute bilateral cerebral and cerebellar infarcts. 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 Goal blood pressure is to be less than [id number] systolic. Patient is currently on hydrochlorothiazide amlodipine Coreg hydralazine and labetalol as needed For pain he is currently on morphine and oxycodone, he was  continuing gabapentin.  Bilateral small pulmonary embolism seen on CT angio on 10/08/2019: This is probably an incidental finding, lower extremity Dopplers was negative for DVT. Heparin was stopped yesterday, GI was consulted recommended observation awaiting further GI recommendations see below for further details.  Bilateral cerebellar infarcts and cerebral infarct: HgbA1c 6.7, started on statin therapy LDL 72 probably need to go on Crestor. MRI, MRA of the brain without contrast as above PT, OT they both recommended CIR. Carotid dopplers unremarkable Transthoracic Echo source of emboli identified.  With an EF of 60%. Is currently on IV heparin for PE we will need to switch him to a NOAC.  Acute kidney injury: Likely prerenal resolved with IV fluid hydration.  New ileus likely contributing to his abdominal pain in the setting of opioid use: He was started on MiraLAX and Dulcolax, abdominal x-ray done on 10/17/2019 is concerning for ileus.  Small bowel protocol showed that the contrast is going through lies mostly now in the colon. He relates that he is now passing gas has not had a bowel movement has been able to ambulate. We will allow him a regular diet and give him MiraLAX.  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  Non-anion gap metabolic acidosis: Likely prerenal currently on IV fluids creatinine is improving hopefully his potassium will continue to improve check an LDH and a lactic acid.  BPH: Continue Flomax.  Acute Lower GI bleed: On 10/08/2019 his hemoglobin was 14 during this time he has been on prophylactic Lovenox and then on IV heparin for PE. He had a bloody bowel movement,  and his hemoglobin was drifting slowly down.  When his drifted to 9.4, GI was consulted recomended observation follow closely  hemoglobin, anticoagulation IV heparin was stopped awaiting further GI recommendations.   DVT prophylaxis: Heparin Family Communication:none Disposition Plan/Barrier to D/C: No probably need CIR after GI work-up has been completed.  Code Status:     Code Status Orders  (From admission, onward)         Start     Ordered   10/08/19 2055  Full code  Continuous     10/08/19 2055        Code Status History    Date Active Date Inactive Code Status Order ID Comments User Context   12/15/2014 2238 12/17/2014 1509 Full Code [id number]  Justice Britain, PA-C Inpatient   Advance Care Planning Activity        IV Access:    Peripheral IV   Procedures and diagnostic studies:   DG CHEST PORT 1 VIEW  Result Date: 10/20/2019 CLINICAL DATA:  Shortness of breath. Recent aortic stent graft. EXAM: PORTABLE CHEST 1 VIEW COMPARISON:  10/15/2019 FINDINGS: The thoracic aortic stent graft appears stable. The right PICC line is stable. The right IJ central venous catheter has been removed. Persistent left lower lobe process, likely a combination of left pleural effusion and left lower lobe atelectasis. No findings for pulmonary edema. The right lung remains clear. IMPRESSION: Persistent left lower lobe process, likely a combination of effusion and atelectasis. Electronically Signed   By: Marijo Sanes M.D.   On: 10/20/2019 14:36   DG Abd Portable 1V-Small Bowel Obstruction Protocol-initial, 8 hr delay  Result Date: 10/19/2019 CLINICAL DATA:  Follow up small bowel obstruction EXAM: PORTABLE ABDOMEN - 1 VIEW COMPARISON:  10/18/2019 FINDINGS: Scattered large and small bowel gas is noted. Recently administered contrast material now lies almost entirely within the colon. A minimal amount in the distal small bowel is seen. Postsurgical changes are noted in the descending thoracic aorta and lower lumbar spine. No free air is seen. IMPRESSION: Recently administered contrast material now lies almost entirely  within the colon. No obstructive changes are seen. Electronically Signed   By: Inez Catalina M.D.   On: 10/19/2019 19:52     Medical Consultants:    None.  Anti-Infectives:   None  Subjective:    [name] Seyller he had a bowel movement and passing gas tolerating his diet.  Objective:    Vitals:   10/21/19 0346 10/21/19 0600 10/21/19 0602 10/21/19 0822  BP:  135/76  (!) 153/79  Pulse: 76   75  Resp: (!) 26   16  Temp:    99.2 F (37.3 C)  TempSrc:      SpO2: 98%   97%  Weight:   93.1 kg   Height:       SpO2: 97 % O2 Flow Rate (L/min): 2 L/min(found pt on 2 lpm [place])   Intake/Output Summary (Last 24 hours) at 10/21/2019 0912 Last data filed at 10/21/2019 0457 Gross per 24 hour  Intake --  Output 2425 ml  Net -2425 ml   Filed Weights   10/19/19 0238 10/20/19 0613 10/21/19 0602  Weight: 94 kg 94.8 kg 93.1 kg    Exam: General exam: In no acute distress. Respiratory system: Good air movement and clear to auscultation. Cardiovascular system: S1 & S2 heard, RRR. No JVD. Gastrointestinal system: Abdomen is nondistended, soft and nontender.  Extremities: No pedal edema. Skin: No rashes, lesions or ulcers   Data  Reviewed:    Labs: Basic Metabolic Panel: Recent Labs  Lab 10/15/19 0507 10/15/19 0507 10/15/19 1215 10/15/19 1215 10/16/19 0409 10/16/19 0409 10/18/19 0444 10/18/19 0444 10/19/19 0541 10/19/19 0541 10/20/19 0645 10/21/19 0339  NA 136   < > 139   < > 139  --  136  --  133*  --  140 141  K 3.7   < > 4.5   < > 4.2   < > 3.7   < > 3.4*   < > 3.3* 3.7  CL 106   < > 108   < > 109  --  107  --  103  --  112* 110  CO2 20*   < > 20*   < > 19*  --  19*  --  19*  --  16* 22  GLUCOSE 212*   < > 154*   < > 111*  --  114*  --  103*  --  94 139*  BUN 19   < > 18   < > 20  --  15  --  15  --  15 14  CREATININE 1.60*   < > 1.58*   < > 1.54*  --  1.38*  --  1.21  --  1.27* 1.10  CALCIUM 8.9   < > 8.7*   < > 8.9  --  8.7*  --  8.4*  --  7.9* 8.2*  MG 2.1  --   2.0  --   --   --   --   --   --   --   --   --    < > = values in this interval not displayed.   GFR Estimated Creatinine Clearance: 68.7 mL/min (by C-G formula based on SCr of 1.1 mg/dL). Liver Function Tests: No results for input(s): AST, ALT, ALKPHOS, BILITOT, PROT, ALBUMIN in the last 168 hours. No results for input(s): LIPASE, AMYLASE in the last 168 hours. Recent Labs  Lab 10/17/19 1557  AMMONIA 14   Coagulation profile Recent Labs  Lab 10/15/19 0507 10/15/19 1215  INR 1.2 1.2   COVID-19 Labs  Recent Labs    10/19/19 0947  LDH 225*    Lab Results  Component Value Date   SARSCOV2NAA NEGATIVE 10/08/2019   [place] NEGATIVE 10/08/2019    CBC: Recent Labs  Lab 10/17/19 0405 10/18/19 0444 10/19/19 0541 10/20/19 0645 10/21/19 0339  WBC 11.6* 12.0* 10.8* 9.2 9.9  HGB 9.5* 9.8* 9.6* 8.8* 9.4*  HCT 29.8* 30.7* 29.8* 27.8* 29.0*  MCV 92.3 91.9 91.7 91.7 90.6  PLT 190 182 204 237 228   Cardiac Enzymes: No results for input(s): CKTOTAL, CKMB, CKMBINDEX, TROPONINI in the last 168 hours. BNP (last 3 results) No results for input(s): PROBNP in the last 8760 hours. CBG: Recent Labs  Lab 10/20/19 1606 10/20/19 2158 10/20/19 2345 10/21/19 0443 10/21/19 0816  GLUCAP 139* 149* 145* 126* 127*   D-Dimer: No results for input(s): DDIMER in the last 72 hours. Hgb A1c: No results for input(s): HGBA1C in the last 72 hours. Lipid Profile: No results for input(s): CHOL, HDL, LDLCALC, TRIG, CHOLHDL, LDLDIRECT in the last 72 hours. Thyroid function studies: No results for input(s): TSH, T4TOTAL, T3FREE, THYROIDAB in the last 72 hours.  Invalid input(s): FREET3 Anemia work up: No results for input(s): VITAMINB12, FOLATE, FERRITIN, TIBC, IRON, RETICCTPCT in the last 72 hours. Sepsis Labs: Recent Labs  Lab 10/18/19 0444 10/19/19 0541 10/20/19 0645 10/21/19 0339  WBC  12.0* 10.8* 9.2 9.9   Microbiology Recent Results (from the past 240 hour(s))  Surgical  PCR screen     Status: None   Collection Time: 10/15/19  6:28 AM   Specimen: Nasal Mucosa; Nasal Swab  Result Value Ref Range Status   MRSA, PCR NEGATIVE NEGATIVE Final   Staphylococcus aureus NEGATIVE NEGATIVE Final    Comment: (NOTE) The Xpert SA Assay (FDA approved for NASAL specimens in patients 91 years of age and older), is one component of a comprehensive surveillance program. It is not intended to diagnose infection nor to guide or monitor treatment. Performed at [facility], [place] [address] [place], [place] [address]      Medications:   . amLODipine  10 mg Oral Daily  . atorvastatin  10 mg Oral q1800  . bisacodyl  10 mg Rectal Daily  . carvedilol  25 mg Oral BID WC  . Chlorhexidine Gluconate Cloth  6 each Topical Daily  . gabapentin  300 mg Oral QHS  . hydrALAZINE  10 mg Intravenous Q4H  . influenza vaccine adjuvanted  0.5 mL Intramuscular Tomorrow-1000  . insulin aspart  0-15 Units Subcutaneous TID WC  . insulin aspart  0-5 Units Subcutaneous QHS  . ipratropium-albuterol  3 mL Nebulization Q4H  . lidocaine  1 patch Transdermal Q24H  . pantoprazole  40 mg Oral Daily  . pneumococcal 23 valent vaccine  0.5 mL Intramuscular Tomorrow-1000  . sodium chloride flush  10-40 mL Intracatheter Q12H  . sodium chloride flush  3 mL Intravenous Once  . sodium chloride flush  3 mL Intravenous Q12H  . tamsulosin  0.4 mg Oral Daily   Continuous Infusions: . sodium chloride 75 mL/hr at 10/20/19 1115  . magnesium sulfate bolus IVPB        LOS: 13 days   Charlynne Cousins  [facility]  10/21/2019, 9:12 AM

## 2019-10-22 ENCOUNTER — Encounter (HOSPITAL_COMMUNITY)
Admission: EM | Disposition: A | Discharge status: Rehabilitation Facility | Payer: Self-pay | Source: Home / Self Care | Attending: Pulmonary Disease

## 2019-10-22 ENCOUNTER — Ambulatory Visit (HOSPITAL_COMMUNITY): Admission: RE | Admit: 2019-10-22 | Payer: Medicare Other | Source: Home / Self Care | Admitting: Orthopedic Surgery

## 2019-10-22 ENCOUNTER — Inpatient Hospital Stay (HOSPITAL_COMMUNITY): Payer: Medicare Other | Admitting: Certified Registered"

## 2019-10-22 ENCOUNTER — Encounter (HOSPITAL_COMMUNITY): Payer: Self-pay | Admitting: Pulmonary Disease

## 2019-10-22 ENCOUNTER — Ambulatory Visit (HOSPITAL_COMMUNITY)
Admission: RE | Admit: 2019-10-22 | Discharge: 2019-10-22 | Disposition: A | Discharge status: Home / Self Care | Payer: Medicare Other | Source: Ambulatory Visit | Attending: Orthopedic Surgery | Admitting: Orthopedic Surgery

## 2019-10-22 DIAGNOSIS — M4807 Spinal stenosis, lumbosacral region: Secondary | ICD-10-CM

## 2019-10-22 HISTORY — PX: RADIOLOGY WITH ANESTHESIA: SHX6223

## 2019-10-22 LAB — CBC
HCT: 28.8 % — ABNORMAL LOW (ref 39.0–52.0)
Hemoglobin: 9.2 g/dL — ABNORMAL LOW (ref 13.0–17.0)
MCH: 29.1 pg (ref 26.0–34.0)
MCHC: 31.9 g/dL (ref 30.0–36.0)
MCV: 91.1 fL (ref 80.0–100.0)
Platelets: 268 10*3/uL (ref 150–400)
RBC: 3.16 MIL/uL — ABNORMAL LOW (ref 4.22–5.81)
RDW: 14.8 % (ref 11.5–15.5)
WBC: 9.4 10*3/uL (ref 4.0–10.5)
nRBC: 0 % (ref 0.0–0.2)

## 2019-10-22 LAB — GLUCOSE, CAPILLARY
Glucose-Capillary: 133 mg/dL — ABNORMAL HIGH (ref 70–99)
Glucose-Capillary: 159 mg/dL — ABNORMAL HIGH (ref 70–99)
Glucose-Capillary: 130 mg/dL — ABNORMAL HIGH (ref 70–99)
Glucose-Capillary: 126 mg/dL — ABNORMAL HIGH (ref 70–99)
Glucose-Capillary: 121 mg/dL — ABNORMAL HIGH (ref 70–99)

## 2019-10-22 LAB — BASIC METABOLIC PANEL
Anion gap: 11 (ref 5–15)
BUN: 16 mg/dL (ref 8–23)
CO2: 22 mmol/L (ref 22–32)
Calcium: 8.5 mg/dL — ABNORMAL LOW (ref 8.9–10.3)
Chloride: 110 mmol/L (ref 98–111)
Creatinine, Ser: 1.11 mg/dL (ref 0.61–1.24)
GFR calc Af Amer: >60 mL/min (ref >60)
GFR calc non Af Amer: >60 mL/min (ref >60)
Glucose, Bld: 135 mg/dL — ABNORMAL HIGH (ref 70–99)
Potassium: 3.7 mmol/L (ref 3.5–5.1)
Sodium: 143 mmol/L (ref 135–145)

## 2019-10-22 LAB — HEPARIN LEVEL (UNFRACTIONATED): Heparin Unfractionated: 0.13 IU/mL — ABNORMAL LOW (ref 0.30–0.70)

## 2019-10-22 SURGERY — MRI WITH ANESTHESIA
Anesthesia: General

## 2019-10-22 MED ORDER — IPRATROPIUM-ALBUTEROL 0.5-2.5 (3) MG/3ML IN SOLN
3.0000 mL | Freq: Four times a day (QID) | RESPIRATORY_TRACT | Status: DC
  Administered 2019-10-22 (×2): 3 mL via RESPIRATORY_TRACT
  Filled 2019-10-22 (×2): qty 3

## 2019-10-22 MED ORDER — FENTANYL CITRATE (PF) 250 MCG/5ML IJ SOLN
INTRAMUSCULAR | Status: DC | PRN
  Administered 2019-10-22 (×2): 25 ug via INTRAVENOUS

## 2019-10-22 MED ORDER — LACTATED RINGERS IV SOLN: INTRAVENOUS | Status: DC

## 2019-10-22 MED ORDER — IPRATROPIUM-ALBUTEROL 0.5-2.5 (3) MG/3ML IN SOLN
3.0000 mL | Freq: Two times a day (BID) | RESPIRATORY_TRACT | Status: DC
  Administered 2019-10-22 – 2019-10-23 (×3): 3 mL via RESPIRATORY_TRACT
  Filled 2019-10-22 (×3): qty 3

## 2019-10-22 MED ORDER — HEPARIN (PORCINE) 25000 UT/250ML-% IV SOLN
1800.0000 [IU]/h | INTRAVENOUS | Status: DC
  Administered 2019-10-22: 1800 [IU]/h via INTRAVENOUS
  Filled 2019-10-22: qty 250

## 2019-10-22 MED ORDER — MEPERIDINE HCL 25 MG/ML IJ SOLN: 6.2500 mg | INTRAMUSCULAR | Status: DC | PRN

## 2019-10-22 MED ORDER — HYDROMORPHONE HCL 1 MG/ML IJ SOLN: 0.2500 mg | INTRAMUSCULAR | Status: DC | PRN

## 2019-10-22 MED ORDER — ONDANSETRON HCL 4 MG/2ML IJ SOLN: 4.0000 mg | Freq: Once | INTRAMUSCULAR | Status: DC | PRN

## 2019-10-22 NOTE — Evaluation (Signed)
Occupational Therapy Evaluation Patient Details Name: [name] [name] MRN: [id number] DOB: [birth year] Today's Date: 10/22/2019    History of Present Illness 74 year old man with 10/10 lower chest and epigastric pain. Has been on/off for several weeks. Has been complaining of back pain as well and is scheduled for spinal stenosis surgery but now states that now both the back and abdominal pains are simultaneous. 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   Clinical Impression   Pt was independent prior to admission. Presents with impaired cognition, reported diplopia, but no clinical symptoms, and poor sitting and standing balance. He needs set up to total assist for ADL and up to moderate assistance for mobility. Pt will need intensive rehab prior to return home. He is an excellent CIR candidate. Will follow acutely.    Follow Up Recommendations  CIR    Equipment Recommendations  Other (comment)(defer to next venue)    Recommendations for Other Services       Precautions / Restrictions Precautions Precautions: Fall Restrictions Weight Bearing Restrictions: No      Mobility Bed Mobility Overal bed mobility: Needs Assistance Bed Mobility: Rolling;Sidelying to Sit;Supine to Sit Rolling: Mod assist Sidelying to sit: Mod assist Supine to sit: Mod assist     General bed mobility comments: pt primarily struggling with coordination, needing multimodal cues to perform and moderate physical assist, increased time  Transfers Overall transfer level: Needs assistance Equipment used: Rolling walker (2 wheeled) Transfers: Sit to/from Stand Sit to Stand: Min assist;Mod assist;From elevated surface         General transfer comment: instructed to place one hand on walker and one on bed to facilitate anterior weight shift to stand     Balance Overall balance assessment: Needs assistance Sitting-balance support: Bilateral upper extremity supported;Feet supported Sitting balance-Leahy Scale: Poor Sitting balance - Comments: posterior lean, cues to correct Postural control: Posterior lean Standing balance support: Bilateral upper extremity supported;During functional activity Standing balance-Leahy Scale: Poor Standing balance comment: mild posterior lean with walker, able to take several steps toward [facility] with min assist for balance and assist to advance walker                           ADL either performed or assessed with clinical judgement   ADL Overall ADL's : Needs assistance/impaired Eating/Feeding: Set up;Bed level   Grooming: Wash/dry hands;Wash/dry face;Sitting;Set up Grooming Details (indicate cue type and reason): min assist for sitting balance Upper Body Bathing: Maximal assistance;Sitting   Lower Body Bathing: Total assistance;Sit to/from stand   Upper Body Dressing : Sitting;Moderate assistance   Lower Body Dressing: Total assistance;Sit to/from stand   Toilet Transfer: Moderate assistance;RW   Toileting- Clothing Manipulation and Hygiene: Total assistance;Sit to/from stand         General ADL Comments: sitting balance interfering with ability to perform ADL in sitting, dependent on UEs for balance     Vision Patient Visual Report: Diplopia Additional Comments: reports "double vision" but demonstrates direct reach and when questioned further, denies diplopia     Perception     Praxis      Pertinent Vitals/Pain Pain Assessment: Faces Faces Pain Scale: Hurts a little bit Pain Location: generalized Pain Descriptors / Indicators: Sore Pain Intervention(s): Monitored during session;Repositioned     Hand Dominance Right  Extremity/Trunk Assessment Upper Extremity Assessment Upper Extremity Assessment: RUE deficits/detail;LUE deficits/detail RUE Deficits / Details: 4-5  shoulder, 4+/5 elbow to hand LUE Deficits / Details: 4-/5 shoulder, 4/5 elbow to hand   Lower Extremity Assessment Lower Extremity Assessment: Defer to PT evaluation   Cervical / Trunk Assessment Cervical / Trunk Assessment: Other exceptions Cervical / Trunk Exceptions: weakness with posterior bias   Communication Communication Communication: Expressive difficulties   Cognition Arousal/Alertness: Awake/alert Behavior During Therapy: Flat affect(tearful at times) Overall Cognitive Status: Impaired/Different from baseline Area of Impairment: Orientation;Memory;Following commands;Safety/judgement;Problem solving;Awareness;Attention                 Orientation Level: Disoriented to;Situation Current Attention Level: Sustained Memory: Decreased short-term memory Following Commands: Follows one step commands with increased time(repeats commands to himself before following through) Safety/Judgement: Decreased awareness of safety;Decreased awareness of deficits Awareness: Intellectual Problem Solving: Slow processing;Decreased initiation;Difficulty sequencing;Requires verbal cues;Requires tactile cues     General Comments       Exercises     Shoulder Instructions      Home Living Family/patient expects to be discharged to:: Private residence Living Arrangements: Spouse/significant other Available Help at Discharge: Family;Available 24 hours/day Type of Home: House Home Access: Level entry     Home Layout: Two level;Able to live on main level with bedroom/bathroom Alternate Level Stairs-Number of Steps: 7 x2 separated by landing Alternate Level Stairs-Rails: Right Bathroom Shower/Tub: Tub/shower unit;Walk-in shower(tub on ground level)   Bathroom Toilet: Standard     Home Equipment: Environmental consultant - 2 wheels;Cane - single point;Transport chair          Prior Functioning/Environment Level of Independence: Independent        Comments: pt likes to play electric guitar,  sing and be with his grandchildren and great grandchildren        OT Problem List: Decreased strength;Decreased activity tolerance;Impaired balance (sitting and/or standing);Decreased cognition;Decreased safety awareness;Decreased knowledge of use of DME or AE;Impaired UE functional use      OT Treatment/Interventions: Self-care/ADL training;Neuromuscular education;DME and/or AE instruction;Therapeutic activities;Cognitive remediation/compensation;Patient/family education;Balance training    OT Goals(Current goals can be found in the care plan section) Acute Rehab OT Goals Patient Stated Goal: To return to baseline OT Goal Formulation: With patient Time For Goal Achievement: 11/05/19 Potential to Achieve Goals: Good ADL Goals Pt Will Perform Grooming: with min guard assist;standing Pt Will Perform Upper Body Dressing: with supervision;with set-up;sitting Pt Will Perform Lower Body Dressing: with min assist;sit to/from stand Pt Will Transfer to Toilet: with min guard assist;bedside commode;ambulating Pt Will Perform Toileting - Clothing Manipulation and hygiene: with min assist;sit to/from stand Pt/caregiver will Perform Home Exercise Program: Increased strength;Both right and left upper extremity;With Supervision Additional ADL Goal #1: Pt will participate in ADL in sitting without LOB x 10 minutes.  OT Frequency: Min 3X/week   Barriers to D/C:            Co-evaluation              AM-PAC OT "6 Clicks" Daily Activity     Outcome Measure Help from another Mane eating meals?: A Little Help from another [place] taking care of personal grooming?: A Little Help from another [place] toileting, which includes using toliet, bedpan, or urinal?: Total Help from another Selph bathing (including washing, rinsing, drying)?: A Lot Help from another [place] to put on and taking off regular upper body clothing?: A Lot Help from another [place] to put on and taking off regular lower body  clothing?: Total  6 Click Score: 12   End of Session    Activity Tolerance: Patient tolerated treatment well Patient left: in bed;with call bell/phone within reach;with bed alarm set  OT Visit Diagnosis: Unsteadiness on feet (R26.81);Other abnormalities of gait and mobility (R26.89);Muscle weakness (generalized) (M62.81);Other symptoms and signs involving cognitive function                Time: [id number] OT Time Calculation (min): 25 min Charges:  OT General Charges $OT Visit: 1 Visit OT Evaluation $OT Eval Moderate Complexity: 1 Mod OT Treatments $Self Care/Home Management : 8-22 mins  Nestor Lewandowsky, OTR/L Acute Rehabilitation Services Pager: [phone] Office: [phone]  Malka So 10/22/2019, 10:09 AM

## 2019-10-22 NOTE — Progress Notes (Signed)
ANTICOAGULATION CONSULT NOTE - Follow Up Consult  Pharmacy Consult for Heparin Indication: pulmonary embolus  Allergies  Allergen Reactions  . Lyrica [Pregabalin] Nausea Only and Other (See Comments)    Hallucinations and dizziness, also  . Sulfa Antibiotics Other (See Comments)    Reaction not recalled     Patient Measurements: Height: 5' 10.98" (180.3 cm) Weight: 203 lb 0.7 oz (92.1 kg) IBW/kg (Calculated) : 75.26 Heparin Dosing Weight: 92.1 kg  Vital Signs: Temp: 98.8 F (37.1 C) (03/04 1530) BP: 142/84 (03/04 1530) Pulse Rate: 71 (03/04 1530)  Labs: Recent Labs    10/20/19 0645 10/20/19 0645 10/20/19 0646 10/21/19 0339 10/22/19 0256 10/22/19 0257  HGB 8.8*   < >  --  9.4*  --  9.2*  HCT 27.8*  --   --  29.0*  --  28.8*  PLT 237  --   --  228  --  268  HEPARINUNFRC  --   --  0.51 0.22* 0.13*  --   CREATININE 1.27*  --   --  1.10  --  1.11   < > = values in this interval not displayed.    Estimated Creatinine Clearance: 67.7 mL/min (by C-G formula based on SCr of 1.11 mg/dL).   Assessment:  Anticoag: No AC PTA. PE with aortic dissection & small hematoma. Heparin IV hold due to GI Bleeding. Changed to apix 2/22 per CCM. Change back to heparin 2/23 per VVS (last apixaban was 2/22 at 9:30pm) 2/25-TEVAR with spinal drainage. Lumar drain and place and removed 2/26. Heparin restarted later that evening>>discon't 3/2 3/4: GI signed off with no further bleeding and stable Hgb 9.2. IV heparin resumed at previous 1800 units/hr  Goal of Therapy:  Heparin level 0.3-0.7 units/ml Monitor platelets by anticoagulation protocol: Yes   Plan:  IV heparin (no bolus with recent bleeding) at 1800 units/hr Check heparin level in 6-8 hrs Daily HL and CBC   Sahib Pella S. Alford Highland, PharmD, BCPS Clinical Staff Pharmacist [email or web address] Alford Highland, [place] 10/22/2019,6:34 PM

## 2019-10-22 NOTE — Transfer of Care (Signed)
Immediate Anesthesia Transfer of Care Note  Patient: Kaidan Harpster Saldivar  Procedure(s) Performed: MRI WITH ANESTHESIA OF LUMBAR SPINE WITHOUT CONTRAST (N/A )  Patient Location: PACU  Anesthesia Type:MAC  Level of Consciousness: awake, alert  and oriented  Airway & Oxygen Therapy: Patient Spontanous Breathing  Post-op Assessment: Report given to RN and Post -op Vital signs reviewed and stable  Post vital signs: Reviewed and stable  Last Vitals:  Vitals Value Taken Time  BP 139/78 10/22/19 1208  Temp 37.1 C 10/22/19 1208  Pulse 67 10/22/19 1221  Resp 27 10/22/19 1221  SpO2 99 % 10/22/19 1221  Vitals shown include unvalidated device data.  Last Pain:  Vitals:   10/22/19 1208  TempSrc:   PainSc: 0-No pain      Patients Stated Pain Goal: 0 (71/27/87 1836)  Complications: No apparent anesthesia complications

## 2019-10-22 NOTE — Progress Notes (Signed)
Patient refused PPV tonight. 

## 2019-10-22 NOTE — Progress Notes (Signed)
   Evaluated after anesthesia for MRI. Abdomen is softer and GI has signed off. Pending CIR.   Thelia Tanksley C. Donzetta Matters, MD [facility] Office: [phone] Pager: [phone]

## 2019-10-22 NOTE — Anesthesia Postprocedure Evaluation (Signed)
Anesthesia Post Note  Patient: Kem Hensen Hagey  Procedure(s) Performed: MRI WITH ANESTHESIA OF LUMBAR SPINE WITHOUT CONTRAST (N/A )     Patient location during evaluation: PACU Anesthesia Type: MAC Level of consciousness: awake and alert Pain management: pain level controlled Vital Signs Assessment: post-procedure vital signs reviewed and stable Respiratory status: spontaneous breathing, nonlabored ventilation, respiratory function stable and patient connected to nasal cannula oxygen Cardiovascular status: stable and blood pressure returned to baseline Postop Assessment: no apparent nausea or vomiting Anesthetic complications: no    Last Vitals:  Vitals:   10/22/19 1223 10/22/19 1224  BP: (!) 138/91   Pulse: 69 69  Resp:  (!) 25  Temp:  37.4 C  SpO2: 99% 99%    Last Pain:  Vitals:   10/22/19 1224  TempSrc:   PainSc: 0-No pain                 Editha Bridgeforth DAVID

## 2019-10-22 NOTE — Progress Notes (Signed)
Physical Therapy Treatment Patient Details Name: [name] [name] MRN: [id number] DOB: [birth year] Today's Date: 10/22/2019    History of Present Illness 74 year old man with 10/10 lower chest and epigastric pain. Has been on/off for several weeks. Has been complaining of back pain as well and is scheduled for spinal stenosis surgery but now states that now both the back and abdominal pains are simultaneous. 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    PT Comments    Pt much less dyspneic on RA this day, and eager to mobilize. Pt continues to present with poor seated balance, with heavy posterior leaning that requires multimodal cuing to correct from PT. Pt able to verbalize "I am leaning back" but struggles to correct. Pt tolerated repeated sit to stands in stedy well today, along with pregait marching in place. Pt is motivated to progress to ambulation, will initiate tomorrow. Will continue to follow acutely.    Follow Up Recommendations  CIR;Supervision/Assistance - 24 hour     Equipment Recommendations  (defer to post-acute setting)    Recommendations for Other Services       Precautions / Restrictions Precautions Precautions: Fall Restrictions Weight Bearing Restrictions: No    Mobility  Bed Mobility Overal bed mobility: Needs Assistance Bed Mobility: Supine to Sit;Sit to Supine     Supine to sit: Mod assist Sit to supine: Max assist   General bed mobility comments: Mod-max assist for supine<>sit for trunk and LE management, scooting to EOB with use of bed pads. PT cued pt to use EOB and bedrail to steady self on EOB, heavy posterior leaning upon sitting.  Transfers Overall transfer level: Needs assistance Equipment used: Ambulation equipment used Transfers: Sit to/from Stand Sit to Stand: Mod assist;From elevated  surface;Min assist         General transfer comment: Initially mod assist to stand from bed to stedy for power up, steadying, correcting posterior leaning, and hand placement. Min assist for initial power up when standing from stedy seat.  Ambulation/Gait             General Gait Details: not attempted today   Stairs             Wheelchair Mobility    Modified Rankin (Stroke Patients Only) Modified Rankin (Stroke Patients Only) Pre-Morbid Rankin Score: No symptoms Modified Rankin: Moderately severe disability     Balance Overall balance assessment: Needs assistance Sitting-balance support: Bilateral upper extremity supported;Feet supported Sitting balance-Leahy Scale: Poor Sitting balance - Comments: posterior lean, verbal and tactile cuing for correcting posture successful but difficult for pt to sustain Postural control: Posterior lean Standing balance support: Bilateral upper extremity supported;During functional activity Standing balance-Leahy Scale: Poor Standing balance comment: mild posterior leaning in standing, corrected with verbal cuing                            Cognition Arousal/Alertness: Awake/alert Behavior During Therapy: Flat affect Overall Cognitive Status: Impaired/Different from baseline Area of Impairment: Memory;Following commands;Safety/judgement;Problem solving;Awareness;Attention                   Current Attention Level: Sustained Memory: Decreased short-term memory Following Commands: Follows one step commands with increased time Safety/Judgement: Decreased awareness of safety;Decreased awareness of deficits Awareness: Intellectual Problem Solving: Slow processing;Decreased initiation;Difficulty sequencing;Requires verbal cues;Requires  tactile cues General Comments: Pt A&Ox4 this session, able to state "i had an aneurysm repaired, and I had strokes" today without cuing. Pt appears down, states "I don't know" if  asked if he is okay and then pt states he feels sad due to physical state. Pt requires repeated and multimodal cuing to mobilize, difficulty with sequencing tasks especially supine<>sit.      Exercises General Exercises - Lower Extremity Ankle Circles/Pumps: AROM;Both;10 reps;Supine Heel Slides: AROM;Both;5 reps;Supine Hip Flexion/Marching: AROM;Both;10 reps;Standing(in stedy) Mini-Sqauts: AROM;Both;10 reps;Seated;Standing;Other (comment)(sit to stand in stedy)    General Comments        Pertinent Vitals/Pain Pain Assessment: No/denies pain Faces Pain Scale: No hurt Pain Intervention(s): Monitored during session    Home Living                      Prior Function            PT Goals (current goals can now be found in the care plan section) Acute Rehab PT Goals Patient Stated Goal: To return to baseline PT Goal Formulation: With patient Time For Goal Achievement: 10/30/19 Potential to Achieve Goals: Good Progress towards PT goals: Progressing toward goals    Frequency    Min 4X/week      PT Plan Current plan remains appropriate    Co-evaluation              AM-PAC PT "6 Clicks" Mobility   Outcome Measure  Help needed turning from your back to your side while in a flat bed without using bedrails?: A Lot Help needed moving from lying on your back to sitting on the side of a flat bed without using bedrails?: A Lot Help needed moving to and from a bed to a chair (including a wheelchair)?: A Lot Help needed standing up from a chair using your arms (e.g., wheelchair or bedside chair)?: A Lot Help needed to walk in hospital room?: A Lot Help needed climbing 3-5 steps with a railing? : Total 6 Click Score: 11    End of Session Equipment Utilized During Treatment: Gait belt Activity Tolerance: Patient limited by fatigue Patient left: in bed;with bed alarm set Nurse Communication: Mobility status PT Visit Diagnosis: Unsteadiness on feet (R26.81);Muscle  weakness (generalized) (M62.81)     Time: [id number] PT Time Calculation (min) (ACUTE ONLY): 30 min  Charges:  $Therapeutic Exercise: 8-22 mins $Therapeutic Activity: 8-22 mins                    Julieta Rogalski E, PT Acute Rehabilitation Services Pager [phone]  Office [phone]  Bailee Thall D Elonda Husky 10/22/2019, 5:34 PM

## 2019-10-22 NOTE — Progress Notes (Signed)
Inpatient Rehabilitation-Admissions Coordinator   [facility] will begin insurance authorization process for possible admit.   Raechel Ache, OTR/L  Rehab Admissions Coordinator  [phone] 10/22/2019 10:24 AM

## 2019-10-22 NOTE — Progress Notes (Addendum)
[facility] PROGRESS NOTE    Progress Note  [name] [name]  [id number] DOB: [birth year] DOA: 10/08/2019 PCP: Wenda Low, MD     Brief Narrative:   [name] [name] is an 74 y.o. male past medical history of BPH hypertension, nephrolithiasis and hyperlipidemia presents with lower chest and epigastric pain for several weeks intermittent.  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  Vascular surgery was consulted who recommended medical management and placed on beta-blocker.  Repeated CTA on 10/12/2019 however showed an aneurysmal dilation had increased to 5.9, so vascular surgery proceeded with vascular repair on 10/15/2019 with percutaneous endograft.  On 10/17/2019 the of the head showed right cerebellum and right occipital hypodensity suspicious for infarct.  MRI of the brain showed bilateral suborbital infarcts and cerebral infarction neurology was consulted.  Significant and studies: CTA CAP 2/18 - aneurysmal dilation of thoracic and abdominal aorta, small PE in RUL pulmonary artery and small subsegmental PE in RLL, soft tissue density in the left common iliac vein CT head 2/27 - Subtle are of gray-white differentiation loss in posterior right occipital lobe, no mass effect. Subtle hypodensity in right cerebellum,. Findings suspicious for acute to subacute infarct particularly in the right occipital lobe MR Brain 2/27 - small acute bilateral cerebral and cerebellar infarcts. 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 Goal blood pressure is to be less than [id number] systolic. Patient is currently on hydrochlorothiazide, amlodipine, Coreg, hydralazine and labetalol as needed For pain he is currently on morphine and oxycodone, he was  continuing gabapentin.  Bilateral small pulmonary embolism seen on CT angio on 10/08/2019: This is probably an incidental finding, lower extremity Dopplers was negative for DVT. Heparin was stopped yesterday, GI was consulted recommended observation awaiting further GI recommendations see below for further details. GI has evaluated him an recommended to restart heparin at this point,.  Bilateral cerebellar infarcts and cerebral infarct: HgbA1c 6.7, started on statin therapy LDL 72 probably need to go on Crestor. MRI, MRA of the brain without contrast as above PT, OT they both recommended CIR. Carotid dopplers unremarkable Transthoracic Echo source of emboli identified.  With an EF of 60%. Is currently on IV heparin for PE we will need to switch him to a NOAC.  Acute kidney injury: Likely prerenal resolved with IV fluid hydration.  New ileus likely contributing to his abdominal pain in the setting of opioid use: He was started on MiraLAX and Dulcolax, abdominal x-ray done on 10/17/2019 is concerning for ileus.  Small bowel protocol showed that the contrast is going through lies mostly now in the colon. He relates that he is now passing gas has not had a bowel movement has been able to ambulate. We will allow him a regular diet and give him MiraLAX.  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  Non-anion gap metabolic acidosis: Likely prerenal currently on IV fluids creatinine is improving hopefully his potassium will continue to improve check an LDH and a lactic acid.  BPH: Continue Flomax.  Acute Lower GI bleed: On 10/08/2019 his hemoglobin was 14 during this time he has been on prophylactic Lovenox and  then on IV heparin for PE. Due to his GI bleed his heparin was stopped GI was consulted, they recommended to observe for  signs of bleeding, his heparin was held his hemoglobin has remained stable awaiting GI further recommendations.  DVT prophylaxis: Heparin Family Communication:none Disposition Plan/Barrier to D/C: No probably need CIR after GI work-up has been completed.  Code Status:     Code Status Orders  (From admission, onward)         Start     Ordered   10/08/19 2055  Full code  Continuous     10/08/19 2055        Code Status History    Date Active Date Inactive Code Status Order ID Comments User Context   12/15/2014 2238 12/17/2014 1509 Full Code [id number]  Justice Britain, PA-C Inpatient   Advance Care Planning Activity        IV Access:    Peripheral IV   Procedures and diagnostic studies:   DG Abd 1 View  Result Date: 10/21/2019 CLINICAL DATA:  Ileus. EXAM: ABDOMEN - 1 VIEW COMPARISON:  10/19/2019 FINDINGS: The patient has evacuated the majority of the contrast from the colon that was present on the prior study. No dilated bowel. Small amount of contrast remains in the rectum. No acute bone abnormality. IMPRESSION: Benign appearing abdomen.  Resolution of the ileus. Electronically Signed   By: Lorriane Shire M.D.   On: 10/21/2019 09:46   DG CHEST PORT 1 VIEW  Result Date: 10/20/2019 CLINICAL DATA:  Shortness of breath. Recent aortic stent graft. EXAM: PORTABLE CHEST 1 VIEW COMPARISON:  10/15/2019 FINDINGS: The thoracic aortic stent graft appears stable. The right PICC line is stable. The right IJ central venous catheter has been removed. Persistent left lower lobe process, likely a combination of left pleural effusion and left lower lobe atelectasis. No findings for pulmonary edema. The right lung remains clear. IMPRESSION: Persistent left lower lobe process, likely a combination of effusion and atelectasis. Electronically Signed   By: Marijo Sanes M.D.   On: 10/20/2019 14:36     Medical Consultants:    None.  Anti-Infectives:   None  Subjective:    [name] [name]  he has had no further bloody bowel movements.  Objective:    Vitals:   10/22/19 0546 10/22/19 0547 10/22/19 0729 10/22/19 0746  BP:  131/72  (!) 147/79  Pulse:    69  Resp:    16  Temp:    98.6 F (37 C)  TempSrc:      SpO2:   98% 100%  Weight: 92.1 kg     Height:       SpO2: 100 % O2 Flow Rate (L/min): 2 L/min FiO2 (%): 40 %   Intake/Output Summary (Last 24 hours) at 10/22/2019 0907 Last data filed at 10/22/2019 0549 Gross per 24 hour  Intake 780 ml  Output 1175 ml  Net -395 ml   Filed Weights   10/20/19 0613 10/21/19 0602 10/22/19 0546  Weight: 94.8 kg 93.1 kg 92.1 kg    Exam: General exam: In no acute distress. Respiratory system: Good air movement and clear to auscultation. Cardiovascular system: S1 & S2 heard, RRR. No JVD.  Gastrointestinal system: Abdomen is nondistended, soft and nontender.  Central nervous system: Alert and oriented. Extremities: No pedal edema. Skin: No rashes, lesions or ulcers    Data Reviewed:    Labs: Basic Metabolic Panel: Recent Labs  Lab 10/15/19 1215 10/16/19 0409 10/18/19 0444 10/18/19 0444  10/19/19 0541 10/19/19 0541 10/20/19 0645 10/20/19 0645 10/21/19 0339 10/22/19 0257  NA 139   < > 136  --  133*  --  140  --  141 143  K 4.5   < > 3.7   < > 3.4*   < > 3.3*   < > 3.7 3.7  CL 108   < > 107  --  103  --  112*  --  110 110  CO2 20*   < > 19*  --  19*  --  16*  --  22 22  GLUCOSE 154*   < > 114*  --  103*  --  94  --  139* 135*  BUN 18   < > 15  --  15  --  15  --  14 16  CREATININE 1.58*   < > 1.38*  --  1.21  --  1.27*  --  1.10 1.11  CALCIUM 8.7*   < > 8.7*  --  8.4*  --  7.9*  --  8.2* 8.5*  MG 2.0  --   --   --   --   --   --   --   --   --    < > = values in this interval not displayed.   GFR Estimated Creatinine Clearance: 67.7 mL/min (by C-G formula based on SCr of 1.11 mg/dL). Liver Function Tests: No results for input(s): AST, ALT, ALKPHOS, BILITOT, PROT, ALBUMIN in the last 168 hours. No results for  input(s): LIPASE, AMYLASE in the last 168 hours. Recent Labs  Lab 10/17/19 1557  AMMONIA 14   Coagulation profile Recent Labs  Lab 10/15/19 1215  INR 1.2   COVID-19 Labs  Recent Labs    10/19/19 0947  LDH 225*    Lab Results  Component Value Date   SARSCOV2NAA NEGATIVE 10/08/2019   [place] NEGATIVE 10/08/2019    CBC: Recent Labs  Lab 10/18/19 0444 10/19/19 0541 10/20/19 0645 10/21/19 0339 10/22/19 0257  WBC 12.0* 10.8* 9.2 9.9 9.4  HGB 9.8* 9.6* 8.8* 9.4* 9.2*  HCT 30.7* 29.8* 27.8* 29.0* 28.8*  MCV 91.9 91.7 91.7 90.6 91.1  PLT 182 204 237 228 268   Cardiac Enzymes: No results for input(s): CKTOTAL, CKMB, CKMBINDEX, TROPONINI in the last 168 hours. BNP (last 3 results) No results for input(s): PROBNP in the last 8760 hours. CBG: Recent Labs  Lab 10/21/19 1153 10/21/19 1707 10/21/19 1923 10/21/19 2209 10/22/19 0744  GLUCAP 134* 190* 143* 126* 133*   D-Dimer: No results for input(s): DDIMER in the last 72 hours. Hgb A1c: No results for input(s): HGBA1C in the last 72 hours. Lipid Profile: No results for input(s): CHOL, HDL, LDLCALC, TRIG, CHOLHDL, LDLDIRECT in the last 72 hours. Thyroid function studies: No results for input(s): TSH, T4TOTAL, T3FREE, THYROIDAB in the last 72 hours.  Invalid input(s): FREET3 Anemia work up: No results for input(s): VITAMINB12, FOLATE, FERRITIN, TIBC, IRON, RETICCTPCT in the last 72 hours. Sepsis Labs: Recent Labs  Lab 10/19/19 0541 10/20/19 0645 10/21/19 0339 10/22/19 0257  WBC 10.8* 9.2 9.9 9.4   Microbiology Recent Results (from the past 240 hour(s))  Surgical PCR screen     Status: None   Collection Time: 10/15/19  6:28 AM   Specimen: Nasal Mucosa; Nasal Swab  Result Value Ref Range Status   MRSA, PCR NEGATIVE NEGATIVE Final   Staphylococcus aureus NEGATIVE NEGATIVE Final    Comment: (NOTE) The Xpert SA Assay (FDA approved for  NASAL specimens in patients 23 years of age and older), is one  component of a comprehensive surveillance program. It is not intended to diagnose infection nor to guide or monitor treatment. Performed at [facility], [place] [address] [place], [place] [address]      Medications:   . amLODipine  10 mg Oral Daily  . atorvastatin  10 mg Oral q1800  . bisacodyl  10 mg Rectal Daily  . carvedilol  25 mg Oral BID WC  . Chlorhexidine Gluconate Cloth  6 each Topical Daily  . gabapentin  300 mg Oral QHS  . hydrALAZINE  10 mg Intravenous Q4H  . influenza vaccine adjuvanted  0.5 mL Intramuscular Tomorrow-1000  . insulin aspart  0-15 Units Subcutaneous TID WC  . insulin aspart  0-5 Units Subcutaneous QHS  . ipratropium-albuterol  3 mL Nebulization QID  . lidocaine  1 patch Transdermal Q24H  . pantoprazole  40 mg Oral Daily  . pneumococcal 23 valent vaccine  0.5 mL Intramuscular Tomorrow-1000  . sodium chloride flush  10-40 mL Intracatheter Q12H  . sodium chloride flush  3 mL Intravenous Once  . sodium chloride flush  3 mL Intravenous Q12H  . tamsulosin  0.4 mg Oral Daily   Continuous Infusions: . sodium chloride 75 mL/hr at 10/20/19 1115  . magnesium sulfate bolus IVPB        LOS: 14 days   Charlynne Cousins  [facility]  10/22/2019, 9:07 AM

## 2019-10-22 NOTE — Anesthesia Procedure Notes (Addendum)
Procedure Name: MAC Date/Time: 10/22/2019 11:32 AM Performed by: Griffin Dakin, CRNA Pre-anesthesia Checklist: Patient identified, Emergency Drugs available, Patient being monitored, Suction available and Timeout performed Patient Re-evaluated:Patient Re-evaluated prior to induction Oxygen Delivery Method: Nasal cannula Induction Type: IV induction Dental Injury: Teeth and Oropharynx as per pre-operative assessment

## 2019-10-22 NOTE — Progress Notes (Signed)
Patient remains free of further bleeding and hemoglobin is stable, off heparin approximately 48 hours.  Discussed case with Dr. Micheline Maze my opinion, it is appropriate and prudent to resume anticoagulation at this time, since his pulmonary emboli presumably constitute a greater risk than the GI bleeding risk, which appears to have been minor in severity and may have been related to hemorrhoids.  We will sign off, but would gladly see the patient again at your request, for example, if further significant bleeding occurs with resumption of heparin.  Cleotis Nipper, M.D. Pager [phone] If no answer or after 5 PM call [phone]

## 2019-10-22 NOTE — Anesthesia Preprocedure Evaluation (Signed)
Anesthesia Evaluation  Patient identified by MRN, date of birth, ID band Patient awake    Reviewed: Allergy & Precautions, NPO status , Patient's Chart, lab work & pertinent test results  Airway Mallampati: I  TM Distance: >3 FB Neck ROM: Full    Dental   Pulmonary former smoker,    Pulmonary exam normal        Cardiovascular hypertension, Pt. on medications Normal cardiovascular exam     Neuro/Psych CVA    GI/Hepatic GERD  Medicated and Controlled,  Endo/Other  diabetes, Type 2  Renal/GU      Musculoskeletal   Abdominal   Peds  Hematology   Anesthesia Other Findings   Reproductive/Obstetrics                             Anesthesia Physical Anesthesia Plan  ASA: III  Anesthesia Plan: MAC   Post-op Pain Management:    Induction:   PONV Risk Score and Plan: 1  Airway Management Planned: Nasal Cannula  Additional Equipment:   Intra-op Plan:   Post-operative Plan:   Informed Consent: I have reviewed the patients History and Physical, chart, labs and discussed the procedure including the risks, benefits and alternatives for the proposed anesthesia with the patient or authorized representative who has indicated his/her understanding and acceptance.       Plan Discussed with: CRNA and Surgeon  Anesthesia Plan Comments:         Anesthesia Quick Evaluation

## 2019-10-23 LAB — CBC
HCT: 29.5 % — ABNORMAL LOW (ref 39.0–52.0)
Hemoglobin: 9.3 g/dL — ABNORMAL LOW (ref 13.0–17.0)
MCH: 29 pg (ref 26.0–34.0)
MCHC: 31.5 g/dL (ref 30.0–36.0)
MCV: 91.9 fL (ref 80.0–100.0)
Platelets: 318 10*3/uL (ref 150–400)
RBC: 3.21 MIL/uL — ABNORMAL LOW (ref 4.22–5.81)
RDW: 14.6 % (ref 11.5–15.5)
WBC: 9.2 10*3/uL (ref 4.0–10.5)
nRBC: 0 % (ref 0.0–0.2)

## 2019-10-23 LAB — GLUCOSE, CAPILLARY
Glucose-Capillary: 106 mg/dL — ABNORMAL HIGH (ref 70–99)
Glucose-Capillary: 110 mg/dL — ABNORMAL HIGH (ref 70–99)
Glucose-Capillary: 152 mg/dL — ABNORMAL HIGH (ref 70–99)
Glucose-Capillary: 156 mg/dL — ABNORMAL HIGH (ref 70–99)

## 2019-10-23 MED ORDER — CARVEDILOL 25 MG PO TABS: 25.0000 mg | ORAL_TABLET | Freq: Two times a day (BID) | ORAL | Status: DC

## 2019-10-23 MED ORDER — APIXABAN 5 MG PO TABS
5.0000 mg | ORAL_TABLET | Freq: Two times a day (BID) | ORAL | Status: DC
  Administered 2019-10-23 – 2019-10-24 (×3): 5 mg via ORAL
  Filled 2019-10-23 (×3): qty 1

## 2019-10-23 MED ORDER — GABAPENTIN 600 MG PO TABS: 300.0000 mg | ORAL_TABLET | Freq: Every day | ORAL | Status: DC

## 2019-10-23 MED ORDER — APIXABAN 5 MG PO TABS: 5.0000 mg | ORAL_TABLET | Freq: Two times a day (BID) | ORAL | Status: DC

## 2019-10-23 MED ORDER — TAMSULOSIN HCL 0.4 MG PO CAPS: 0.4000 mg | ORAL_CAPSULE | Freq: Every day | ORAL | Status: DC

## 2019-10-23 NOTE — Progress Notes (Signed)
Focus of session on ADL training sit<>stand at sink. Pt with excellent participation. Fatigues easily and appears SOB, but Sp02 94% with HR in 70s.    10/23/19 1228  OT Visit Information  Last OT Received On 10/23/19  Assistance Needed +1 (+2 chair follow)  PT/OT/SLP Co-Evaluation/Treatment Yes  Reason for Co-Treatment For patient/therapist safety  OT goals addressed during session ADL's and self-care  History of Present Illness 74 year old man with 10/10 lower chest and epigastric pain. Has been on/off for several weeks. Has been complaining of back pain as well and is scheduled for spinal stenosis surgery but now states that now both the back and abdominal pains are simultaneous. 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  Precautions  Precautions Fall  Pain Assessment  Pain Assessment No/denies pain  Cognition  Arousal/Alertness Awake/alert  Behavior During Therapy Flat affect  Overall Cognitive Status Impaired/Different from baseline  Area of Impairment Orientation;Memory;Following commands;Safety/judgement;Awareness;Problem solving  Orientation Level Disoriented to;Situation  Current Attention Level Sustained  Memory Decreased short-term memory  Following Commands Follows one step commands with increased time  Safety/Judgement Decreased awareness of safety;Decreased awareness of deficits  Awareness Intellectual  Problem Solving Slow processing;Decreased initiation;Difficulty sequencing;Requires verbal cues;Requires tactile cues  ADL  Overall ADL's  Needs assistance/impaired  Grooming Wash/dry hands;Wash/dry face;Brushing hair;Set up;Sitting  Upper Body Bathing Moderate assistance;Sitting  Upper Body Bathing Details (indicate cue type and reason) assisted for back  Lower Body Bathing Sit to/from stand;Total assistance  Upper Body  Dressing  Minimal assistance;Sitting  Lower Body Dressing Total assistance;Sit to/from stand  Toileting- Clothing Manipulation and Hygiene Moderate assistance;Sitting/lateral lean;Sit to/from stand  Functional mobility during ADLs Minimal assistance;Rolling walker;+2 for safety/equipment;Moderate assistance  Bed Mobility  General bed mobility comments pt in chair upon OTs arrival  Balance  Overall balance assessment Needs assistance  Sitting balance-Leahy Scale Fair  Standing balance-Leahy Scale Poor  Standing balance comment reliant on  B UE and external support  Transfers  Overall transfer level Needs assistance  Transfers Sit to/from Stand  Sit to Stand Mod assist  General transfer comment pulled up on sink  OT - End of Session  Equipment Utilized During Treatment Gait belt;Rolling walker  Activity Tolerance Patient tolerated treatment well  Patient left in chair;with call bell/phone within reach;with chair alarm set;with family/visitor present  Nurse Communication Other (comment) (NT--pt wants to shave, bath completed)  OT Assessment/Plan  OT Plan Discharge plan remains appropriate  OT Visit Diagnosis Unsteadiness on feet (R26.81);Other abnormalities of gait and mobility (R26.89);Muscle weakness (generalized) (M62.81);Other symptoms and signs involving cognitive function  OT Frequency (ACUTE ONLY) Min 3X/week  Follow Up Recommendations CIR  OT Equipment Other (comment) (defer to next venue)  AM-PAC OT "6 Clicks" Daily Activity Outcome Measure (Version 2)  Help from another [place] eating meals? 3  Help from another [place] taking care of personal grooming? 3  Help from another [place] toileting, which includes using toliet, bedpan, or urinal? 1  Help from another [place] bathing (including washing, rinsing, drying)? 2  Help from another [place] to put on and taking off regular upper body clothing? 3  Help from another [place] to put on and taking off regular lower body clothing? 1   6 Click Score 13  OT Goal Progression  Progress towards OT goals Progressing toward goals  Acute Rehab OT Goals  Patient Stated  Goal home  OT Goal Formulation With patient  Time For Goal Achievement 11/05/19  Potential to Achieve Goals Good  OT Time Calculation  OT Start Time (ACUTE ONLY) 1047  OT Stop Time (ACUTE ONLY) 1136  OT Time Calculation (min) 49 min  OT General Charges  $OT Visit 1 Visit  OT Treatments  $Self Care/Home Management  23-37 mins  Nestor Lewandowsky, OTR/L Acute Rehabilitation Services Pager: [phone] Office: [phone]

## 2019-10-23 NOTE — Progress Notes (Signed)
  Progress Note    10/23/2019 6:10 AM 1 Day Post-Op  Subjective: No complaints this morning  Vitals:   10/23/19 0122 10/23/19 0547  BP: (!) 163/75 (!) 156/84  Pulse:    Resp:    Temp:    SpO2:      Physical Exam: Awake and alert Abdomen is softer Palpable distal pulses bilaterally Right groin soft without hematoma  CBC    Component Value Date/Time   WBC 9.2 10/23/2019 0346   RBC 3.21 (L) 10/23/2019 0346   HGB 9.3 (L) 10/23/2019 0346   HCT 29.5 (L) 10/23/2019 0346   PLT 318 10/23/2019 0346   MCV 91.9 10/23/2019 0346   MCH 29.0 10/23/2019 0346   MCHC 31.5 10/23/2019 0346   RDW 14.6 10/23/2019 0346   LYMPHSABS 1.7 12/10/2014 1106   MONOABS 0.6 12/10/2014 1106   EOSABS 0.2 12/10/2014 1106   BASOSABS 0.0 12/10/2014 1106    BMET    Component Value Date/Time   NA 143 10/22/2019 0257   K 3.7 10/22/2019 0257   CL 110 10/22/2019 0257   CO2 22 10/22/2019 0257   GLUCOSE 135 (H) 10/22/2019 0257   BUN 16 10/22/2019 0257   CREATININE 1.11 10/22/2019 0257   CALCIUM 8.5 (L) 10/22/2019 0257   GFRNONAA >60 10/22/2019 0257   GFRAA >60 10/22/2019 0257    INR    Component Value Date/Time   INR 1.2 10/15/2019 1215     Intake/Output Summary (Last 24 hours) at 10/23/2019 0610 Last data filed at 10/23/2019 0548 Gross per 24 hour  Intake 900.05 ml  Output 1375 ml  Net -474.95 ml     Assessment/plan:  74 y.o. male is s/p: Thoracic endovascular aneurysm.  Pending CIR.    Meda Dudzinski C. Donzetta Matters, MD [facility] Office: [phone] Pager: [phone]  10/23/2019 6:10 AM

## 2019-10-23 NOTE — Discharge Instructions (Signed)
Information on my medicine - ELIQUIS (apixaban)  This medication education was reviewed with me or my healthcare representative as part of my discharge preparation.    Why was Eliquis prescribed for you? Eliquis was prescribed to treat blood clots that may have been found in the veins of your legs (deep vein thrombosis) or in your lungs (pulmonary embolism) and to reduce the risk of them occurring again.  What do You need to know about Eliquis ? The dose is one 5 mg tablet taken TWICE daily.  Eliquis may be taken with or without food.   Try to take the dose about the same time in the morning and in the evening. If you have difficulty swallowing the tablet whole please discuss with your pharmacist how to take the medication safely.  Take Eliquis exactly as prescribed and DO NOT stop taking Eliquis without talking to the doctor who prescribed the medication.  Stopping may increase your risk of developing a new blood clot.  Refill your prescription before you run out.  After discharge, you should have regular check-up appointments with your healthcare provider that is prescribing your Eliquis.    What do you do if you miss a dose? If a dose of ELIQUIS is not taken at the scheduled time, take it as soon as possible on the same day. The dose should not be doubled to make up for a missed dose.  Important Safety Information A possible side effect of Eliquis is bleeding. You should call your healthcare provider right away if you experience any of the following: ? Bleeding from an injury or your nose that does not stop. ? Unusual colored urine (red or dark brown) or unusual colored stools (red or black). ? Unusual bruising for unknown reasons. ? A serious fall or if you hit your head (even if there is no bleeding).  Some medicines may interact with Eliquis and might increase your risk of bleeding or clotting while on Eliquis. To help avoid this, consult your healthcare provider or  pharmacist prior to using any new prescription or non-prescription medications, including herbals, vitamins, non-steroidal anti-inflammatory drugs (NSAIDs) and supplements.  This website has more information on Eliquis (apixaban): http://[email or web address].com/eliquis/home

## 2019-10-23 NOTE — Progress Notes (Signed)
[facility] PROGRESS NOTE    Progress Note  [name] [name]  [id number] DOB: 11-28-1945 DOA: 10/08/2019 PCP: Wenda Low, MD     Brief Narrative:   [name] [name] is an 74 y.o. male past medical history of BPH hypertension, nephrolithiasis and hyperlipidemia presents with lower chest and epigastric pain for several weeks intermittent.  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  Vascular surgery was consulted who recommended medical management and placed on beta-blocker.  Repeated CTA on 10/12/2019 however showed an aneurysmal dilation had increased to 5.9, so vascular surgery proceeded with vascular repair on 10/15/2019 with percutaneous endograft.  On 10/17/2019 the of the head showed right cerebellum and right occipital hypodensity suspicious for infarct.  MRI of the brain showed bilateral suborbital infarcts and cerebral infarction neurology was consulted.  Significant and studies: CTA CAP 2/18 - aneurysmal dilation of thoracic and abdominal aorta, small PE in RUL pulmonary artery and small subsegmental PE in RLL, soft tissue density in the left common iliac vein CT head 2/27 - Subtle are of gray-white differentiation loss in posterior right occipital lobe, no mass effect. Subtle hypodensity in right cerebellum,. Findings suspicious for acute to subacute infarct particularly in the right occipital lobe MR Brain 2/27 - small acute bilateral cerebral and cerebellar infarcts. 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 Goal blood pressure is to be less than [id number] systolic. Patient is currently on hydrochlorothiazide, amlodipine, Coreg, hydralazine and labetalol as needed For pain he is currently on morphine and oxycodone, he was  continuing gabapentin.  Bilateral small pulmonary embolism seen on CT angio on 10/08/2019: This is probably an incidental finding, lower extremity Dopplers was negative for DVT. Heparin was stopped yesterday, GI was consulted. GI has evaluated the patient and agreed to resume anticoagulation, he was started on IV heparin overnight he has had no episodes of bleeding, his hemoglobin has remained stable we will transition him to Eliquis. Patient is stable to be transferred to CIR.  Bilateral cerebellar infarcts and cerebral infarct: HgbA1c 6.7, started on statin therapy LDL 72 probably need to go on Crestor. MRI, MRA of the brain without contrast as above PT, OT they both recommended CIR. Carotid dopplers unremarkable Transthoracic Echo source of emboli identified.  With an EF of 60%. Is currently on IV heparin for PE we will need to switch him to a NOAC.  Acute kidney injury: Likely prerenal resolved with IV fluid hydration.  New ileus likely contributing to his abdominal pain in the setting of opioid use: He was started on MiraLAX and Dulcolax, abdominal x-ray done on 10/17/2019 is concerning for ileus.  Small bowel protocol showed that the contrast is going through lies mostly now in the colon. Patient is now having regular bowel movement ileus is resolved.  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  Non-anion gap metabolic acidosis: Likely prerenal currently on IV fluids creatinine is improving hopefully his potassium will continue to improve check an LDH and a lactic acid.  BPH: Continue Flomax.  Acute Lower GI bleed: On 10/08/2019 his hemoglobin was 14 during this time he has been on prophylactic Lovenox and  then on IV heparin for PE. IV heparin was stopped, GI was consulted who recommended observation, and probably  that this episodes of bleed was likely due to hemorrhoids heparin was started when he was monitored overnight with his hemoglobin is stable.  No signs of overt bleeding. Eliquis was started for PE.  DVT prophylaxis: Heparin Family Communication:none Disposition Plan/Barrier to D/C: Awaiting transfer to CIR.  Code Status:     Code Status Orders  (From admission, onward)         Start     Ordered   10/08/19 2055  Full code  Continuous     10/08/19 2055        Code Status History    Date Active Date Inactive Code Status Order ID Comments User Context   12/15/2014 2238 12/17/2014 1509 Full Code [id number]  Justice Britain, PA-C Inpatient   Advance Care Planning Activity        IV Access:    Peripheral IV   Procedures and diagnostic studies:   MR LUMBAR SPINE WO CONTRAST  Result Date: 10/22/2019 CLINICAL DATA:  Low back pain. Evaluate neurologic compression. EXAM: MRI LUMBAR SPINE WITHOUT CONTRAST TECHNIQUE: Multiplanar, multisequence MR imaging of the lumbar spine was performed. No intravenous contrast was administered. COMPARISON:  MRI of the lumbar spine March 06, 2018 and January 23, 2013 FINDINGS: Segmentation: A transitional lumbosacral vertebra is assumed to represent the L5 level. Careful correlation with this numbering strategy prior to any procedural intervention would be recommended. Alignment:  Physiologic. Vertebrae: Interbody fusion and posterior left transpedicular fixation is noted at L3-4 with laminectomy at L4-5. Degenerative endplate changes are noted at L3-4. Marrow signal characteristics are otherwise maintained. Conus medullaris and cauda equina: Conus extends to the T12-L1 level. Conus appear normal. Redundancy of nerve roots at the level of L1 is related to L2-3 spinal canal stenosis. Paraspinal and other soft tissues: Well-defined oval lesion located to the left of midline in the prevertebral soft tissue at the level of L5-S1 is seen in the sagittal views. The  lesion as predominantly hypointense signal on T1 and heterogeneous signal on T2. When compared to prior study, the lesion appears with similar size (3.7 x 2.7 cm) when compared to study performed 2019 but enlarged when compared to MRI performed 2014. T2 signal appear changed when compared to prior study. 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 Right renal cysts. Disc levels: T12-L1: No spinal canal or neural foraminal stenosis. L1-2: No spinal canal or neural foraminal stenosis. L2-3: Disc bulge with superimposed tiny right central disc protrusion, facet degenerative changes and ligamentum flavum redundancy resulting in moderate to severe spinal canal stenosis, moderate right and severe left neural foraminal stenosis. L3-4: Laminectomy with facet fusion. No spinal canal or neural foraminal stenosis. L4-5: Disc bulge with tiny superimposed central disc protrusion and mild facet degenerative changes result in severe bilateral neural foraminal narrowing. Laminectomy is also noted with no spinal canal stenosis. L5-S1: No spinal canal or neural foraminal stenosis. Compared to prior study, there has been significant progression of the degree of spinal canal stenosis at L2-3. IMPRESSION: 1. 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 This has progressed when compared to prior MRI. 2. Well-defined oval lesion the prevertebral soft tissues to the left of midline at the L5-S1 level, only seen on the sagittal images. This appear with similar size although slightly  different T2 signal when compared to study performed 2019 but enlarged when compared to study performed in 2014. 3. 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 Recommend followup by ultrasound in 2 years. This recommendation follows ACR consensus guidelines:  White Paper of the ACR Incidental Findings Committee II on Vascular Findings. J Am Coll Radiol 2013; 10: Electronically Signed   By: Pedro Earls M.D.   On: 10/22/2019 14:06     Medical Consultants:    None.  Anti-Infectives:   None  Subjective:    [name] [name] no complaints, having regular bowel movements no signs of overt bleeding.  Objective:    Vitals:   10/23/19 0122 10/23/19 0547 10/23/19 0713 10/23/19 0736  BP: (!) 163/75 (!) 156/84  (!) 156/84  Pulse:    69  Resp:    19  Temp:    98.9 F (37.2 C)  TempSrc:    Oral  SpO2:   93% 98%  Weight:      Height:       SpO2: 98 % O2 Flow Rate (L/min): 2 L/min FiO2 (%): 40 %   Intake/Output Summary (Last 24 hours) at 10/23/2019 0856 Last data filed at 10/23/2019 0548 Gross per 24 hour  Intake 900.05 ml  Output 1375 ml  Net -474.95 ml   Filed Weights   10/20/19 0613 10/21/19 0602 10/22/19 0546  Weight: 94.8 kg 93.1 kg 92.1 kg    Exam: General exam: In no acute distress. Respiratory system: Good air movement and clear to auscultation. Cardiovascular system: S1 & S2 heard, RRR. No JVD. Gastrointestinal system: Abdomen is nondistended, soft and nontender.  Extremities: No pedal edema. Psychiatry: Judgement and insight appear normal. Mood & affect appropriate.    Data Reviewed:    Labs: Basic Metabolic Panel: Recent Labs  Lab 10/18/19 0444 10/18/19 0444 10/19/19 0541 10/19/19 0541 10/20/19 0645 10/20/19 0645 10/21/19 0339 10/22/19 0257  NA 136  --  133*  --  140  --  141 143  K 3.7   < > 3.4*   < > 3.3*   < > 3.7 3.7  CL 107  --  103  --  112*  --  110 110  CO2 19*  --  19*  --  16*  --  22 22  GLUCOSE 114*  --  103*  --  94  --  139* 135*  BUN 15  --  15  --  15  --  14 16  CREATININE 1.38*  --  1.21  --  1.27*  --  1.10 1.11  CALCIUM 8.7*  --  8.4*  --  7.9*  --  8.2* 8.5*   < > = values in this interval not displayed.   GFR Estimated Creatinine Clearance: 67.7 mL/min (by  C-G formula based on SCr of 1.11 mg/dL). Liver Function Tests: No results for input(s): AST, ALT, ALKPHOS, BILITOT, PROT, ALBUMIN in the last 168 hours. No results for input(s): LIPASE, AMYLASE in the last 168 hours. Recent Labs  Lab 10/17/19 1557  AMMONIA 14   Coagulation profile No results for input(s): INR, PROTIME in the last 168 hours. COVID-19 Labs  No results for input(s): DDIMER, FERRITIN, LDH, CRP in the last 72 hours.  Lab Results  Component Value Date   [place] NEGATIVE 10/08/2019   [place] NEGATIVE 10/08/2019    CBC: Recent Labs  Lab 10/19/19 0541 10/20/19 0645 10/21/19 0339 10/22/19 0257 10/23/19 0346  WBC 10.8*  9.2 9.9 9.4 9.2  HGB 9.6* 8.8* 9.4* 9.2* 9.3*  HCT 29.8* 27.8* 29.0* 28.8* 29.5*  MCV 91.7 91.7 90.6 91.1 91.9  PLT 204 237 228 268 318   Cardiac Enzymes: No results for input(s): CKTOTAL, CKMB, CKMBINDEX, TROPONINI in the last 168 hours. BNP (last 3 results) No results for input(s): PROBNP in the last 8760 hours. CBG: Recent Labs  Lab 10/22/19 0744 10/22/19 0912 10/22/19 1246 10/22/19 1713 10/22/19 2054  GLUCAP 133* 159* 130* 126* 121*   D-Dimer: No results for input(s): DDIMER in the last 72 hours. Hgb A1c: No results for input(s): HGBA1C in the last 72 hours. Lipid Profile: No results for input(s): CHOL, HDL, LDLCALC, TRIG, CHOLHDL, LDLDIRECT in the last 72 hours. Thyroid function studies: No results for input(s): TSH, T4TOTAL, T3FREE, THYROIDAB in the last 72 hours.  Invalid input(s): FREET3 Anemia work up: No results for input(s): VITAMINB12, FOLATE, FERRITIN, TIBC, IRON, RETICCTPCT in the last 72 hours. Sepsis Labs: Recent Labs  Lab 10/20/19 0645 10/21/19 0339 10/22/19 0257 10/23/19 0346  WBC 9.2 9.9 9.4 9.2   Microbiology Recent Results (from the past 240 hour(s))  Surgical PCR screen     Status: None   Collection Time: 10/15/19  6:28 AM   Specimen: Nasal Mucosa; Nasal Swab  Result Value Ref Range Status    MRSA, PCR NEGATIVE NEGATIVE Final   Staphylococcus aureus NEGATIVE NEGATIVE Final    Comment: (NOTE) The Xpert SA Assay (FDA approved for NASAL specimens in patients 89 years of age and older), is one component of a comprehensive surveillance program. It is not intended to diagnose infection nor to guide or monitor treatment. Performed at [facility], [place] [address] [place], [place] [address]      Medications:   . amLODipine  10 mg Oral Daily  . atorvastatin  10 mg Oral q1800  . bisacodyl  10 mg Rectal Daily  . carvedilol  25 mg Oral BID WC  . Chlorhexidine Gluconate Cloth  6 each Topical Daily  . gabapentin  300 mg Oral QHS  . hydrALAZINE  10 mg Intravenous Q4H  . influenza vaccine adjuvanted  0.5 mL Intramuscular Tomorrow-1000  . insulin aspart  0-15 Units Subcutaneous TID WC  . insulin aspart  0-5 Units Subcutaneous QHS  . ipratropium-albuterol  3 mL Nebulization BID  . lidocaine  1 patch Transdermal Q24H  . pantoprazole  40 mg Oral Daily  . pneumococcal 23 valent vaccine  0.5 mL Intramuscular Tomorrow-1000  . sodium chloride flush  10-40 mL Intracatheter Q12H  . sodium chloride flush  3 mL Intravenous Once  . sodium chloride flush  3 mL Intravenous Q12H  . tamsulosin  0.4 mg Oral Daily   Continuous Infusions: . sodium chloride 75 mL/hr at 10/23/19 0548  . heparin 1,800 Units/hr (10/22/19 2015)  . magnesium sulfate bolus IVPB        LOS: 15 days   Charlynne Cousins  [facility]  10/23/2019, 8:56 AM

## 2019-10-23 NOTE — H&P (Signed)
Physical Medicine and Rehabilitation Admission H&P    Chief Complaint  Patient presents with  . Chest Pain  . Shortness of Breath  : HPI: [name] [name]. [name] [name] is a 74 year old right-handed male with history of chronic back pain with lumbar discectomy 2014 as well as anterior lateral lumbar fusion 2016, BPH, hypertension, diabetes mellitus, hyperlipidemia and remote tobacco abuse.  Per chart review lives with spouse independent prior to admission.  Two-level home bed and bath on main level.  Presented 10/08/2019 with lower chest and back pain with epigastric discomfort times several weeks as well as bouts of nausea and vomiting.  In the ED blood pressure 180s/130s.  EKG showed nonspecific ST-T changes not significantly different from prior EKG 2016.  First troponin HS 28, potassium 3.3, creatinine 1.25, WBC 14,100, hemoglobin 14.5, SARS coronavirus negative.  Chest x-ray no active disease.  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  Small pulmonary embolus involving the central right lobe pulmonary artery as well as probable additional small subsegmental embolus in the right lower lobe.  Echocardiogram with ejection fraction 65%.  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 Donzetta Matters.  Placed on intravenous heparin therapy.  Neurology consulted 10/17/2019 for altered mental status.  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  Carotid Dopplers with no ICA stenosis.  Neurology follow-up and heparin was transitioned to Eliquis.  Hospital course blood loss anemia 9.3 and monitored with follow-up per GI services there was some fear of possible GI bleed related to intravenous heparin as he was transition to Eliquis no other overt bleeding noted..  Tolerating a regular consistency diet.  Patient  was admitted for a comprehensive rehab program.  Review of Systems  Constitutional: Negative for chills and fever.  HENT: Negative for hearing loss.   Eyes: Negative for blurred vision and double vision.  Respiratory: Negative for cough and shortness of breath.   Cardiovascular: Positive for chest pain. Negative for leg swelling.  Gastrointestinal: Positive for nausea and vomiting.       Epigastric discomfort  Genitourinary: Positive for urgency. Negative for dysuria, flank pain and hematuria.  Musculoskeletal: Positive for back pain.  Skin: Negative for rash.  All other systems reviewed and are negative.  Past Medical History:  Diagnosis Date  . Chronic back pain    stenosis  . Enlarged prostate   . GERD (gastroesophageal reflux disease)    occasionally will take a zantac(maybe once a month)  . History of colon polyps   . History of kidney stones   . History of stress test    done 10 yrs. ago, as a baseline   . Hyperlipidemia    takes Crestor daily  . Hypertension    takes Amlodipine daily and Lotensin as well   Past Surgical History:  Procedure Laterality Date  . ANTERIOR LAT LUMBAR FUSION Left 12/15/2014   Procedure: ANTERIOR LATERAL LUMBAR FUSION 1 LEVEL;  Surgeon: Phylliss Bob, MD;  Location: [place];  Service: Orthopedics;  Laterality: Left;  Left sided lateral lumbar interbody fusion, lumbar 3-4, posterior spinal fusion, lumbar 3-4 with instrumentation.  . COLONOSCOPY    . ESOPHAGOGASTRODUODENOSCOPY    . fatty tissue removed from stomach    . LUMBAR LAMINECTOMY/DECOMPRESSION MICRODISCECTOMY N/A 07/08/2013   Procedure: LUMBAR LAMINECTOMY/DECOMPRESSION MICRODISCECTOMY;  Surgeon: Sinclair Ship, MD;  Location: [place];  Service: Orthopedics;  Laterality: N/A;  Lumbar 3-4, lumbar 4-5 decompression  . RADIOLOGY WITH ANESTHESIA N/A 10/22/2019   Procedure: MRI WITH ANESTHESIA OF LUMBAR SPINE WITHOUT CONTRAST;  Surgeon: Radiologist, Medication, MD;  Location: [place];  Service:  Radiology;  Laterality: N/A;  . right ankle surgery     as child   . THORACIC AORTIC ENDOVASCULAR STENT GRAFT N/A 10/15/2019   Procedure: THORACIC AORTIC ENDOVASCULAR STENT GRAFT;  Surgeon: Waynetta Sandy, MD;  Location: [place];  Service: Vascular;  Laterality: N/A;  . TONSILLECTOMY     as a child   History reviewed. No pertinent family history. Social History:  reports that he has quit smoking. His smoking use included cigarettes. He has a 10.00 pack-year smoking history. He has never used smokeless tobacco. He reports that he does not drink alcohol or use drugs. Allergies:  Allergies  Allergen Reactions  . Lyrica [Pregabalin] Nausea Only and Other (See Comments)    Hallucinations and dizziness, also  . Sulfa Antibiotics Other (See Comments)    Reaction not recalled    Medications Prior to Admission  Medication Sig Dispense Refill  . acetaminophen (TYLENOL) 500 MG tablet Take 1,000 mg by mouth every 6 (six) hours as needed for mild pain or headache.     Marland Kitchen amLODipine (NORVASC) 10 MG tablet Take 10 mg by mouth daily.     . Ascorbic Acid (VITAMIN C) 500 MG CAPS Take 500-1,000 mg by mouth daily.    Marland Kitchen aspirin EC 81 MG tablet Take 81 mg by mouth daily.    . benazepril-hydrochlorthiazide (LOTENSIN HCT) 20-25 MG per tablet Take 1 tablet by mouth at bedtime.     Marland Kitchen CALCIUM PO Take 1 tablet by mouth daily.    . Cyanocobalamin (VITAMIN B-12 PO) Take 1 tablet by mouth daily.    Marland Kitchen HYDROcodone-acetaminophen (NORCO/VICODIN) 5-325 MG tablet Take 1-2 tablets by mouth See admin instructions. Take one to two tablets by mouth every 8-12 hours as needed for pain    . meloxicam (MOBIC) 15 MG tablet Take 15 mg by mouth daily.    . metFORMIN (GLUCOPHAGE) 500 MG tablet Take 500 mg by mouth 2 (two) times daily.    . Multiple Vitamins-Minerals (MULTIVITAL PO) Take 1 tablet by mouth daily.    . rosuvastatin (CRESTOR) 10 MG tablet Take 10 mg by mouth daily.      Drug Regimen Review Drug regimen was  reviewed and remains appropriate with no significant issues identified  Home: Home Living Family/patient expects to be discharged to:: Private residence Living Arrangements: Spouse/significant other Available Help at Discharge: Family, Available 24 hours/day Type of Home: House Home Access: Level entry Home Layout: Two level, Able to live on main level with bedroom/bathroom Alternate Level Stairs-Number of Steps: 7 x2 separated by landing Alternate Level Stairs-Rails: Right Bathroom Shower/Tub: Tub/shower unit, Walk-in shower(tub on ground level) Biochemist, clinical: Standard Home Equipment: Environmental consultant - 2 wheels, Cane - single point, Futures trader History: Prior Function Level of Independence: Independent Comments: pt likes to play Art gallery manager, sing and be with his grandchildren and great grandchildren  Functional Status:  Mobility: Bed Mobility Overal bed mobility: Needs Assistance Bed Mobility: Supine to Sit Rolling: Mod assist Sidelying to sit: Mod assist Supine to sit: Mod assist, HOB elevated Sit to supine: Max assist General bed mobility comments: pt in chair upon OTs arrival Transfers Overall transfer level: Needs assistance Equipment used: Rolling walker (2 wheeled) Transfer via Lift Equipment: Stedy Transfers: Sit to/from  Stand Sit to Stand: Mod assist General transfer comment: pulled up on sink Ambulation/Gait Ambulation/Gait assistance: Mod assist, +2 safety/equipment Gait Distance (Feet): 15 Feet(+10) Assistive device: Rolling walker (2 wheeled) Gait Pattern/deviations: Step-through pattern, Trunk flexed, Decreased stride length General Gait Details: assist for RW management and to stabilize balance. Continual sequencing cues. Increased overall flexion with fatigue. Ambulated 10' then 15' with RW. 3-minute seated rest break in between. +2 for close chair follow. Gait velocity: decreased Gait velocity interpretation: <1.31 ft/sec, indicative of  household ambulator    ADL: ADL Overall ADL's : Needs assistance/impaired Eating/Feeding: Set up, Bed level Grooming: Wash/dry hands, Wash/dry face, Brushing hair, Set up, Sitting Grooming Details (indicate cue type and reason): min assist for sitting balance Upper Body Bathing: Moderate assistance, Sitting Upper Body Bathing Details (indicate cue type and reason): assisted for back Lower Body Bathing: Sit to/from stand, Total assistance Upper Body Dressing : Minimal assistance, Sitting Lower Body Dressing: Total assistance, Sit to/from stand Toilet Transfer: Moderate assistance, RW Toileting- Clothing Manipulation and Hygiene: Moderate assistance, Sitting/lateral lean, Sit to/from stand Functional mobility during ADLs: Minimal assistance, Rolling walker, +2 for safety/equipment, Moderate assistance General ADL Comments: sitting balance interfering with ability to perform ADL in sitting, dependent on UEs for balance  Cognition: Cognition Overall Cognitive Status: Impaired/Different from baseline Orientation Level: Oriented X4 Cognition Arousal/Alertness: Awake/alert Behavior During Therapy: Flat affect Overall Cognitive Status: Impaired/Different from baseline Area of Impairment: Orientation, Memory, Following commands, Safety/judgement, Awareness, Problem solving Orientation Level: Disoriented to, Situation Current Attention Level: Sustained Memory: Decreased short-term memory Following Commands: Follows one step commands with increased time Safety/Judgement: Decreased awareness of safety, Decreased awareness of deficits Awareness: Intellectual Problem Solving: Slow processing, Decreased initiation, Difficulty sequencing, Requires verbal cues, Requires tactile cues General Comments: Pt pleasant and motivated to participate. Wife present. Increased processing time. Cues to stay on task. Continual sequencing cues.  Physical Exam: Blood pressure (!) 142/76, pulse 70, temperature  98.9 F (37.2 C), temperature source Oral, resp. rate 19, height 5' 10.98" (1.803 m), weight 92.1 kg, SpO2 100 %.  Physical Exam   General: Alert and oriented x 2 (states month is February), No apparent distress HEENT: Head is normocephalic, atraumatic, PERRLA, EOMI, sclera anicteric, oral mucosa pink and moist, dentition intact, ext ear canals clear,  Neck: Supple without JVD or lymphadenopathy Heart: Reg rate and rhythm. No murmurs rubs or gallops Chest: CTA bilaterally without wheezes, rales, or rhonchi; no distress Abdomen: Soft, non-tender, distended, bowel sounds positive. Extremities: No clubbing, cyanosis, or edema. Pulses are 2+ Skin: Clean and intact without signs of breakdown Neuro: Pt is cognitively appropriate with normal insight, memory, and awareness. Cranial nerves 2-12 are intact. Delayed processing. Follows commands. ModA bed mobility. 4+/5 strength throughout Psych: Pt's affect is appropriate. Pt is cooperative   Results for orders placed or performed during the hospital encounter of 10/08/19 (from the past 48 hour(s))  Glucose, capillary     Status: Abnormal   Collection Time: 10/21/19  5:07 PM  Result Value Ref Range   Glucose-Capillary 190 (H) 70 - 99 mg/dL    Comment: Glucose reference range applies only to samples taken after fasting for at least 8 hours.  Glucose, capillary     Status: Abnormal   Collection Time: 10/21/19  7:23 PM  Result Value Ref Range   Glucose-Capillary 143 (H) 70 - 99 mg/dL    Comment: Glucose reference range applies only to samples taken after fasting for at least 8 hours.  Glucose, capillary  Status: Abnormal   Collection Time: 10/21/19 10:09 PM  Result Value Ref Range   Glucose-Capillary 126 (H) 70 - 99 mg/dL    Comment: Glucose reference range applies only to samples taken after fasting for at least 8 hours.  Heparin level (unfractionated)     Status: Abnormal   Collection Time: 10/22/19  2:56 AM  Result Value Ref Range    Heparin Unfractionated 0.13 (L) 0.30 - 0.70 IU/mL    Comment: (NOTE) If heparin results are below expected values, and patient dosage has  been confirmed, suggest follow up testing of antithrombin III levels. Performed at [facility], [place] [address] [place], [place] [address]   CBC     Status: Abnormal   Collection Time: 10/22/19  2:57 AM  Result Value Ref Range   WBC 9.4 4.0 - 10.5 K/uL   RBC 3.16 (L) 4.22 - 5.81 MIL/uL   Hemoglobin 9.2 (L) 13.0 - 17.0 g/dL   HCT 28.8 (L) 39.0 - 52.0 %   MCV 91.1 80.0 - 100.0 fL   MCH 29.1 26.0 - 34.0 pg   MCHC 31.9 30.0 - 36.0 g/dL   RDW 14.8 11.5 - 15.5 %   Platelets 268 150 - 400 K/uL   nRBC 0.0 0.0 - 0.2 %    Comment: Performed at [facility], [place] [address], [place], [place] [id number]  Basic metabolic panel     Status: Abnormal   Collection Time: 10/22/19  2:57 AM  Result Value Ref Range   Sodium 143 135 - 145 mmol/L   Potassium 3.7 3.5 - 5.1 mmol/L   Chloride 110 98 - 111 mmol/L   CO2 22 22 - 32 mmol/L   Glucose, Bld 135 (H) 70 - 99 mg/dL    Comment: Glucose reference range applies only to samples taken after fasting for at least 8 hours.   BUN 16 8 - 23 mg/dL   Creatinine, Ser 1.11 0.61 - 1.24 mg/dL   Calcium 8.5 (L) 8.9 - 10.3 mg/dL   GFR calc non Af Amer >60 >60 mL/min   GFR calc Af Amer >60 >60 mL/min   Anion gap 11 5 - 15    Comment: Performed at [facility] [address], [place], [place] [address]  Glucose, capillary     Status: Abnormal   Collection Time: 10/22/19  7:44 AM  Result Value Ref Range   Glucose-Capillary 133 (H) 70 - 99 mg/dL    Comment: Glucose reference range applies only to samples taken after fasting for at least 8 hours.  Glucose, capillary     Status: Abnormal   Collection Time: 10/22/19  9:12 AM  Result Value Ref Range   Glucose-Capillary 159 (H) 70 - 99 mg/dL    Comment: Glucose reference range applies only to samples taken after fasting for at least 8 hours.  Glucose, capillary      Status: Abnormal   Collection Time: 10/22/19 12:46 PM  Result Value Ref Range   Glucose-Capillary 130 (H) 70 - 99 mg/dL    Comment: Glucose reference range applies only to samples taken after fasting for at least 8 hours.  Glucose, capillary     Status: Abnormal   Collection Time: 10/22/19  5:13 PM  Result Value Ref Range   Glucose-Capillary 126 (H) 70 - 99 mg/dL    Comment: Glucose reference range applies only to samples taken after fasting for at least 8 hours.  Glucose, capillary     Status:  Abnormal   Collection Time: 10/22/19  8:54 PM  Result Value Ref Range   Glucose-Capillary 121 (H) 70 - 99 mg/dL    Comment: Glucose reference range applies only to samples taken after fasting for at least 8 hours.  CBC     Status: Abnormal   Collection Time: 10/23/19  3:46 AM  Result Value Ref Range   WBC 9.2 4.0 - 10.5 K/uL   RBC 3.21 (L) 4.22 - 5.81 MIL/uL   Hemoglobin 9.3 (L) 13.0 - 17.0 g/dL   HCT 29.5 (L) 39.0 - 52.0 %   MCV 91.9 80.0 - 100.0 fL   MCH 29.0 26.0 - 34.0 pg   MCHC 31.5 30.0 - 36.0 g/dL   RDW 14.6 11.5 - 15.5 %   Platelets 318 150 - 400 K/uL   nRBC 0.0 0.0 - 0.2 %    Comment: Performed at [facility], [place] [address] [place], [place] [address]  Glucose, capillary     Status: Abnormal   Collection Time: 10/23/19  7:52 AM  Result Value Ref Range   Glucose-Capillary 110 (H) 70 - 99 mg/dL    Comment: Glucose reference range applies only to samples taken after fasting for at least 8 hours.  Glucose, capillary     Status: Abnormal   Collection Time: 10/23/19  8:01 AM  Result Value Ref Range   Glucose-Capillary 106 (H) 70 - 99 mg/dL    Comment: Glucose reference range applies only to samples taken after fasting for at least 8 hours.  Glucose, capillary     Status: Abnormal   Collection Time: 10/23/19 11:48 AM  Result Value Ref Range   Glucose-Capillary 152 (H) 70 - 99 mg/dL    Comment: Glucose reference range applies only to samples taken after fasting for at  least 8 hours.   MR LUMBAR SPINE WO CONTRAST  Result Date: 10/22/2019 CLINICAL DATA:  Low back pain. Evaluate neurologic compression. EXAM: MRI LUMBAR SPINE WITHOUT CONTRAST TECHNIQUE: Multiplanar, multisequence MR imaging of the lumbar spine was performed. No intravenous contrast was administered. COMPARISON:  MRI of the lumbar spine March 06, 2018 and January 23, 2013 FINDINGS: Segmentation: A transitional lumbosacral vertebra is assumed to represent the L5 level. Careful correlation with this numbering strategy prior to any procedural intervention would be recommended. Alignment:  Physiologic. Vertebrae: Interbody fusion and posterior left transpedicular fixation is noted at L3-4 with laminectomy at L4-5. Degenerative endplate changes are noted at L3-4. Marrow signal characteristics are otherwise maintained. Conus medullaris and cauda equina: Conus extends to the T12-L1 level. Conus appear normal. Redundancy of nerve roots at the level of L1 is related to L2-3 spinal canal stenosis. Paraspinal and other soft tissues: Well-defined oval lesion located to the left of midline in the prevertebral soft tissue at the level of L5-S1 is seen in the sagittal views. The lesion as predominantly hypointense signal on T1 and heterogeneous signal on T2. When compared to prior study, the lesion appears with similar size (3.7 x 2.7 cm) when compared to study performed 2019 but enlarged when compared to MRI performed 2014. T2 signal appear changed when compared to prior study. 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 Right renal cysts. Disc levels: T12-L1: No spinal canal or neural foraminal stenosis. L1-2: No spinal canal or neural foraminal stenosis. L2-3: Disc bulge with superimposed tiny right central disc protrusion, facet degenerative changes and ligamentum flavum redundancy resulting in moderate to severe spinal canal stenosis, moderate right and severe  left neural foraminal stenosis. L3-4:  Laminectomy with facet fusion. No spinal canal or neural foraminal stenosis. L4-5: Disc bulge with tiny superimposed central disc protrusion and mild facet degenerative changes result in severe bilateral neural foraminal narrowing. Laminectomy is also noted with no spinal canal stenosis. L5-S1: No spinal canal or neural foraminal stenosis. Compared to prior study, there has been significant progression of the degree of spinal canal stenosis at L2-3. IMPRESSION: 1. 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 This has progressed when compared to prior MRI. 2. Well-defined oval lesion the prevertebral soft tissues to the left of midline at the L5-S1 level, only seen on the sagittal images. This appear with similar size although slightly different T2 signal when compared to study performed 2019 but enlarged when compared to study performed in 2014. 3. 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 Recommend followup by ultrasound in 2 years. This recommendation follows ACR consensus guidelines: White Paper of the ACR Incidental Findings Committee II on Vascular Findings. J Am Coll Radiol 2013; 10: Electronically Signed   By: Pedro Earls M.D.   On: 10/22/2019 14:06       Medical Problem List and Plan: 1.  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  Antithrombotics: -DVT/anticoagulation/pulmonary emboli: Eliquis  -antiplatelet therapy: N/A 3. Pain Management: Neurontin 300 mg nightly, Lidoderm patch. Well controlled.  4. Mood: Provide emotional support  -antipsychotic agents: N/A 5. Neuropsych: This patient is capable of making decisions on his own  behalf. 6. Skin/Wound Care: Routine skin checks 7. Fluids/Electrolytes/Nutrition: Routine in and outs with follow-up chemistries 8.  Acute blood loss anemia.  Follow-up CBC 9.  Hypertension.  Norvasc 10 mg daily, Coreg 25 mg twice daily, hydralazine as directed 10.  BPH.  Flomax 11.  Diabetes mellitus.  Hemoglobin A1c 6.7.  SSI.  Patient on Glucophage 500 mg twice daily prior to admission.  Resume as needed 12.  Hyperlipidemia.  Lipitor 13. Constipation: Magnesium citrate. If needed follow in 3-4 hours with enema to completely clean patient out.  14. Disposition: Lives with wife who is at home and will be able to help him. Has 3 daughters whom he is very close with who also live nearby.   Lavon Paganini Angiulli, PA-C 10/23/2019   I have personally performed a face to face diagnostic evaluation, including, but not limited to relevant history and physical exam findings, of this patient and developed relevant assessment and plan.  Additionally, I have reviewed and concur with the physician assistant's documentation above.  Leeroy Cha, MD

## 2019-10-23 NOTE — Progress Notes (Signed)
ANTICOAGULATION CONSULT NOTE - Follow Up Consult  Pharmacy Consult for Heparin Indication: pulmonary embolus  Allergies  Allergen Reactions  . Lyrica [Pregabalin] Nausea Only and Other (See Comments)    Hallucinations and dizziness, also  . Sulfa Antibiotics Other (See Comments)    Reaction not recalled     Patient Measurements: Height: 5' 10.98" (180.3 cm) Weight: 203 lb 0.7 oz (92.1 kg) IBW/kg (Calculated) : 75.26 Heparin Dosing Weight: 92.1 kg  Vital Signs: Temp: 98.9 F (37.2 C) (03/05 0736) Temp Source: Oral (03/05 0736) BP: 145/87 (03/05 0901) Pulse Rate: 78 (03/05 0901)  Labs: Recent Labs    10/21/19 0339 10/21/19 0339 10/22/19 0256 10/22/19 0257 10/23/19 0346  HGB 9.4*   < >  --  9.2* 9.3*  HCT 29.0*  --   --  28.8* 29.5*  PLT 228  --   --  268 318  HEPARINUNFRC 0.22*  --  0.13*  --   --   CREATININE 1.10  --   --  1.11  --    < > = values in this interval not displayed.    Estimated Creatinine Clearance: 67.7 mL/min (by C-G formula based on SCr of 1.11 mg/dL).   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 Patient is not taking anticoagulation PTA. The patient was started on Heparin on 2/18, switched to Apix x 1 day on 2/22, then back to Heparin. Heparin was recently held for evaluation of GIB however no appears to be stable and resumed on 3/4 evening, to transition to Apixaban today.  The patient has been on/off Heparin for 16 days now. Will plan to transition straight to the maintenance dosing of Apixaban - discussed plans with MD. Hgb/Hct low but stable, plts 318.   Goal of Therapy:  Heparin level 0.3-0.7 units/ml Monitor platelets by anticoagulation protocol: Yes   Plan:  - D/c Heparin - Start Apixaban 5 mg bid - Transition plans discussed with the RN - Will plan to educate prior to discharge - noted possible plans for  CIR - Pharmacy will sign off but monitor peripherally  Thank you for allowing pharmacy to be a part of this patient's care.  Alycia Rossetti, PharmD, BCPS Clinical Pharmacist Clinical phone for 10/23/2019: [phone] 10/23/2019 9:29 AM   **Pharmacist phone directory can now be found on [email or web address] ([email or web address]).  Listed under [place].

## 2019-10-23 NOTE — Progress Notes (Signed)
Physical Therapy Treatment Patient Details Name: [name] [name] MRN: [id number] DOB: 1945/10/12 Today's Date: 10/23/2019    History of Present Illness 74 year old man with 10/10 lower chest and epigastric pain. Has been on/off for several weeks. Has been complaining of back pain as well and is scheduled for spinal stenosis surgery but now states that now both the back and abdominal pains are simultaneous. 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    PT Comments    Pt motivated and eager to participate in therapy. He required mod assist bed mobility, mod assist sit to stand and mod assist +2 safety/chair follow ambulation with RW 10' and 15'. Cues/assist needed to maintain upright stance. Gait cadence improved with continual sequencing cues ("right, left, right, left") and assist with RW management. He performed LE exercises in recliner prior to ambulation. Mobilized on RA with SpO2 94%.    Follow Up Recommendations  CIR;Supervision/Assistance - 24 hour     Equipment Recommendations  Other (comment)(defer to next venue)    Recommendations for Other Services       Precautions / Restrictions Precautions Precautions: Fall    Mobility  Bed Mobility Overal bed mobility: Needs Assistance Bed Mobility: Supine to Sit     Supine to sit: Mod assist;HOB elevated     General bed mobility comments: +rail, assist with BLE and to elevate trunk  Transfers Overall transfer level: Needs assistance Equipment used: Rolling walker (2 wheeled) Transfers: Sit to/from Stand Sit to Stand: Mod assist         General transfer comment: cues for hand placement and sequencing. Increased time to attain full, upright stance.  Ambulation/Gait Ambulation/Gait assistance: Mod assist;+2 safety/equipment Gait Distance (Feet): 15 Feet(+10) Assistive  device: Rolling walker (2 wheeled) Gait Pattern/deviations: Step-through pattern;Trunk flexed;Decreased stride length Gait velocity: decreased Gait velocity interpretation: <1.31 ft/sec, indicative of household ambulator General Gait Details: assist for RW management and to stabilize balance. Continual sequencing cues. Increased overall flexion with fatigue. Ambulated 10' then 15' with RW. 3-minute seated rest break in between. +2 for close chair follow.   Stairs             Wheelchair Mobility    Modified Rankin (Stroke Patients Only) Modified Rankin (Stroke Patients Only) Pre-Morbid Rankin Score: No symptoms Modified Rankin: Moderately severe disability     Balance Overall balance assessment: Needs assistance Sitting-balance support: Feet supported;Bilateral upper extremity supported Sitting balance-Leahy Scale: Fair     Standing balance support: Bilateral upper extremity supported;During functional activity Standing balance-Leahy Scale: Poor Standing balance comment: reliant on external support                            Cognition Arousal/Alertness: Awake/alert Behavior During Therapy: Flat affect Overall Cognitive Status: Impaired/Different from baseline Area of Impairment: Memory;Following commands;Safety/judgement;Problem solving;Awareness;Attention                   Current Attention Level: Sustained Memory: Decreased short-term memory Following Commands: Follows one step commands with increased time Safety/Judgement: Decreased awareness of safety;Decreased awareness of deficits Awareness: Intellectual Problem Solving: Slow processing;Decreased initiation;Difficulty sequencing;Requires verbal cues;Requires tactile cues General Comments: Pt pleasant and motivated to participate. Wife present. Increased processing time. Cues to stay on task. Continual sequencing cues.      Exercises General Exercises - Lower  Extremity Ankle Circles/Pumps:  AROM;Both;10 reps;Seated Long Arc Quad: AROM;Right;Left;10 reps;Seated Hip Flexion/Marching: AROM;Right;Left;10 reps;Seated    General Comments General comments (skin integrity, edema, etc.): Pt mobilized on RA with SpO2 94%. SpO2 96% on RA in recliner at end of session.      Pertinent Vitals/Pain Pain Assessment: No/denies pain    Home Living                      Prior Function            PT Goals (current goals can now be found in the care plan section) Acute Rehab PT Goals Patient Stated Goal: home Progress towards PT goals: Progressing toward goals    Frequency    Min 4X/week      PT Plan Current plan remains appropriate    Co-evaluation              AM-PAC PT "6 Clicks" Mobility   Outcome Measure  Help needed turning from your back to your side while in a flat bed without using bedrails?: A Little Help needed moving from lying on your back to sitting on the side of a flat bed without using bedrails?: A Lot Help needed moving to and from a bed to a chair (including a wheelchair)?: A Lot Help needed standing up from a chair using your arms (e.g., wheelchair or bedside chair)?: A Lot Help needed to walk in hospital room?: A Lot Help needed climbing 3-5 steps with a railing? : Total 6 Click Score: 12    End of Session Equipment Utilized During Treatment: Gait belt Activity Tolerance: Patient tolerated treatment well Patient left: in chair;with family/visitor present;Other (comment)(OT in room) Nurse Communication: Mobility status;Other (comment)(SpO2) PT Visit Diagnosis: Unsteadiness on feet (R26.81);Muscle weakness (generalized) (M62.81)     Time: [id number] PT Time Calculation (min) (ACUTE ONLY): 27 min  Charges:  $Gait Training: 8-22 mins $Therapeutic Exercise: 8-22 mins                     Lorrin Goodell, PT  Office # [phone] Pager [phone]    Lorriane Shire 10/23/2019, 11:33 AM

## 2019-10-23 NOTE — Progress Notes (Signed)
Inpatient Rehabilitation-Admissions Coordinator   I have received insurance approval for admit to CIR.   I have approval from Dr. Aileen Fass for admission Saturday, 10/24/19. PM&R MD, Dr. Ranell Patrick will see patient Sauturday morning to confirm patient readiness. Pt's RN can call [facility] nursing station [phone]) by noon to get report.  I have spoken with pt and his wife regarding approval and they both would like to proceed with CIR tomorrow. Reviewed consent form and insurance benefit letter. TOC team aware of plan for admit Saturday.   Raechel Ache, OTR/L  Rehab Admissions Coordinator  [phone] 10/23/2019 2:51 PM

## 2019-10-24 ENCOUNTER — Encounter (HOSPITAL_COMMUNITY): Payer: Self-pay | Admitting: Physical Medicine & Rehabilitation

## 2019-10-24 ENCOUNTER — Inpatient Hospital Stay (HOSPITAL_COMMUNITY)
Admission: RE | Admit: 2019-10-24 | Discharge: 2019-11-13 | DRG: 057 | Disposition: A | Discharge status: Home Health | Payer: Medicare Other | Source: Intra-hospital | Attending: Physical Medicine & Rehabilitation | Admitting: Physical Medicine & Rehabilitation

## 2019-10-24 ENCOUNTER — Other Ambulatory Visit: Payer: Self-pay

## 2019-10-24 DIAGNOSIS — N400 Enlarged prostate without lower urinary tract symptoms: Secondary | ICD-10-CM | POA: Diagnosis present

## 2019-10-24 DIAGNOSIS — Z8673 Personal history of transient ischemic attack (TIA), and cerebral infarction without residual deficits: Secondary | ICD-10-CM

## 2019-10-24 DIAGNOSIS — K5900 Constipation, unspecified: Secondary | ICD-10-CM | POA: Diagnosis present

## 2019-10-24 DIAGNOSIS — I69398 Other sequelae of cerebral infarction: Secondary | ICD-10-CM | POA: Diagnosis present

## 2019-10-24 DIAGNOSIS — I2609 Other pulmonary embolism with acute cor pulmonale: Secondary | ICD-10-CM

## 2019-10-24 DIAGNOSIS — J90 Pleural effusion, not elsewhere classified: Secondary | ICD-10-CM | POA: Diagnosis present

## 2019-10-24 DIAGNOSIS — Z7984 Long term (current) use of oral hypoglycemic drugs: Secondary | ICD-10-CM

## 2019-10-24 DIAGNOSIS — K219 Gastro-esophageal reflux disease without esophagitis: Secondary | ICD-10-CM | POA: Diagnosis present

## 2019-10-24 DIAGNOSIS — Z87891 Personal history of nicotine dependence: Secondary | ICD-10-CM | POA: Diagnosis not present

## 2019-10-24 DIAGNOSIS — Z79899 Other long term (current) drug therapy: Secondary | ICD-10-CM | POA: Diagnosis not present

## 2019-10-24 DIAGNOSIS — R0602 Shortness of breath: Secondary | ICD-10-CM

## 2019-10-24 DIAGNOSIS — E119 Type 2 diabetes mellitus without complications: Secondary | ICD-10-CM | POA: Diagnosis present

## 2019-10-24 DIAGNOSIS — E785 Hyperlipidemia, unspecified: Secondary | ICD-10-CM | POA: Diagnosis present

## 2019-10-24 DIAGNOSIS — I10 Essential (primary) hypertension: Secondary | ICD-10-CM | POA: Diagnosis present

## 2019-10-24 DIAGNOSIS — E1351 Other specified diabetes mellitus with diabetic peripheral angiopathy without gangrene: Secondary | ICD-10-CM

## 2019-10-24 DIAGNOSIS — Z981 Arthrodesis status: Secondary | ICD-10-CM | POA: Diagnosis not present

## 2019-10-24 DIAGNOSIS — D62 Acute posthemorrhagic anemia: Secondary | ICD-10-CM | POA: Diagnosis present

## 2019-10-24 DIAGNOSIS — R32 Unspecified urinary incontinence: Secondary | ICD-10-CM | POA: Diagnosis present

## 2019-10-24 DIAGNOSIS — N179 Acute kidney failure, unspecified: Secondary | ICD-10-CM

## 2019-10-24 LAB — CBC
HCT: 29.5 % — ABNORMAL LOW (ref 39.0–52.0)
Hemoglobin: 9.4 g/dL — ABNORMAL LOW (ref 13.0–17.0)
MCH: 29 pg (ref 26.0–34.0)
MCHC: 31.9 g/dL (ref 30.0–36.0)
MCV: 91 fL (ref 80.0–100.0)
Platelets: 369 10*3/uL (ref 150–400)
RBC: 3.24 MIL/uL — ABNORMAL LOW (ref 4.22–5.81)
RDW: 14.5 % (ref 11.5–15.5)
WBC: 9.5 10*3/uL (ref 4.0–10.5)
nRBC: 0 % (ref 0.0–0.2)

## 2019-10-24 LAB — GLUCOSE, CAPILLARY
Glucose-Capillary: 110 mg/dL — ABNORMAL HIGH (ref 70–99)
Glucose-Capillary: 136 mg/dL — ABNORMAL HIGH (ref 70–99)

## 2019-10-24 MED ORDER — LIDOCAINE 5 % EX PTCH
1.0000 | MEDICATED_PATCH | CUTANEOUS | Status: DC
  Administered 2019-10-25 – 2019-11-12 (×18): 1 via TRANSDERMAL
  Filled 2019-10-24 (×18): qty 1

## 2019-10-24 MED ORDER — IPRATROPIUM-ALBUTEROL 0.5-2.5 (3) MG/3ML IN SOLN: 3.0000 mL | Freq: Four times a day (QID) | RESPIRATORY_TRACT | Status: DC | PRN

## 2019-10-24 MED ORDER — ATORVASTATIN CALCIUM 10 MG PO TABS
10.0000 mg | ORAL_TABLET | Freq: Every day | ORAL | Status: DC
  Administered 2019-10-25 – 2019-11-08 (×15): 10 mg via ORAL
  Filled 2019-10-24 (×15): qty 1

## 2019-10-24 MED ORDER — ACETAMINOPHEN 650 MG RE SUPP: 650.0000 mg | Freq: Four times a day (QID) | RECTAL | Status: AC | PRN

## 2019-10-24 MED ORDER — GABAPENTIN 600 MG PO TABS
300.0000 mg | ORAL_TABLET | Freq: Every day | ORAL | Status: DC
  Administered 2019-10-24 – 2019-11-12 (×20): 300 mg via ORAL
  Filled 2019-10-24 (×20): qty 1

## 2019-10-24 MED ORDER — CARVEDILOL 25 MG PO TABS
25.0000 mg | ORAL_TABLET | Freq: Two times a day (BID) | ORAL | Status: DC
  Administered 2019-10-24 – 2019-11-13 (×40): 25 mg via ORAL
  Filled 2019-10-24 (×40): qty 1

## 2019-10-24 MED ORDER — ACETAMINOPHEN 325 MG PO TABS
650.0000 mg | ORAL_TABLET | Freq: Four times a day (QID) | ORAL | Status: AC | PRN
  Administered 2019-10-26: 650 mg via ORAL
  Filled 2019-10-24: qty 2

## 2019-10-24 MED ORDER — AMLODIPINE BESYLATE 10 MG PO TABS
10.0000 mg | ORAL_TABLET | Freq: Every day | ORAL | Status: DC
  Administered 2019-10-25 – 2019-11-09 (×16): 10 mg via ORAL
  Filled 2019-10-24 (×16): qty 1

## 2019-10-24 MED ORDER — MAGNESIUM CITRATE PO SOLN
1.0000 | Freq: Once | ORAL | Status: DC
  Filled 2019-10-24: qty 296

## 2019-10-24 MED ORDER — HYDRALAZINE HCL 10 MG PO TABS
10.0000 mg | ORAL_TABLET | Freq: Four times a day (QID) | ORAL | Status: DC
  Administered 2019-10-24 – 2019-11-13 (×78): 10 mg via ORAL
  Filled 2019-10-24 (×79): qty 1

## 2019-10-24 MED ORDER — TAMSULOSIN HCL 0.4 MG PO CAPS
0.4000 mg | ORAL_CAPSULE | Freq: Every day | ORAL | Status: DC
  Administered 2019-10-25 – 2019-11-13 (×20): 0.4 mg via ORAL
  Filled 2019-10-24 (×20): qty 1

## 2019-10-24 MED ORDER — PANTOPRAZOLE SODIUM 40 MG PO TBEC
40.0000 mg | DELAYED_RELEASE_TABLET | Freq: Every day | ORAL | Status: DC
  Administered 2019-10-25 – 2019-11-13 (×20): 40 mg via ORAL
  Filled 2019-10-24 (×20): qty 1

## 2019-10-24 MED ORDER — INSULIN ASPART 100 UNIT/ML ~~LOC~~ SOLN
0.0000 [IU] | Freq: Three times a day (TID) | SUBCUTANEOUS | Status: DC
  Administered 2019-10-25 – 2019-10-27 (×4): 2 [IU] via SUBCUTANEOUS
  Administered 2019-10-27: 3 [IU] via SUBCUTANEOUS
  Administered 2019-10-28 (×2): 2 [IU] via SUBCUTANEOUS
  Administered 2019-10-28 – 2019-10-29 (×2): 3 [IU] via SUBCUTANEOUS
  Administered 2019-10-29 – 2019-10-30 (×3): 2 [IU] via SUBCUTANEOUS
  Administered 2019-10-31: 3 [IU] via SUBCUTANEOUS
  Administered 2019-10-31 – 2019-11-01 (×3): 2 [IU] via SUBCUTANEOUS
  Administered 2019-11-01: 0 [IU] via SUBCUTANEOUS
  Administered 2019-11-02 – 2019-11-03 (×2): 3 [IU] via SUBCUTANEOUS
  Administered 2019-11-03 – 2019-11-12 (×12): 2 [IU] via SUBCUTANEOUS

## 2019-10-24 MED ORDER — APIXABAN 5 MG PO TABS
5.0000 mg | ORAL_TABLET | Freq: Two times a day (BID) | ORAL | Status: DC
  Administered 2019-10-24 – 2019-11-13 (×40): 5 mg via ORAL
  Filled 2019-10-24 (×40): qty 1

## 2019-10-24 MED ORDER — SORBITOL 70 % SOLN
30.0000 mL | Freq: Every day | Status: DC | PRN
  Administered 2019-10-28 – 2019-10-31 (×4): 30 mL via ORAL
  Filled 2019-10-24 (×4): qty 30

## 2019-10-24 MED ORDER — PANTOPRAZOLE SODIUM 40 MG PO TBEC: 40.0000 mg | DELAYED_RELEASE_TABLET | Freq: Every day | ORAL | Status: DC

## 2019-10-24 NOTE — Progress Notes (Signed)
Courtney Heys, MD  Physician  Physical Medicine and Rehabilitation  Consult Note  Signed  Date of Service:  10/20/2019  5:49 AM      Related encounter: ED to Hosp-Admission (Discharged) from 10/08/2019 in [facility]      Signed      Expand AllCollapse All   Show:Clear all [x] Manual[x] Template[] Copied  Added by: [x] Angiulli, Lavon Paganini, PA-C[x] Courtney Heys, MD  [] Hover for details          Physical Medicine and Rehabilitation Consult Reason for Consult: Altered mental status with weakness Referring Physician: [facility]     HPI: [name] [name] is a 74 y.o. right-handed male with history of chronic back pain with lumbar discectomy 2014 as well as anterior lateral lumbar fusion 2016, BPH, hypertension, hyperlipidemia, remote tobacco abuse.  Per chart review patient lives with spouse.  Independent prior to admission.  Two-level home bed and bath on main level.  Presented 10/08/2019 with lower chest/back pain and epigastric discomfort times several weeks as well as bouts of nausea and vomiting.  In the ED blood pressure 180s/130s.  EKG showed nonspecific ST-T changes not significantly different from prior EKG 2016.  First troponin HS 28, potassium 3.3, creatinine 1.25, WBC 14,100, SARS coronavirus negative.  Chest x-ray no active disease.  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  Small pulmonary embolus involving the central right upper lobe pulmonary artery as well as probable additional small subsegmental embolus in the right lower lobe.  Echocardiogram with ejection fraction of 65%.  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 Donzetta Matters.  Patient maintained on intravenous heparin.  Neurology consulted 10/17/2019 for altered mental status and weakness.  CT/MRI showed small acute bilateral cerebral  and cerebellar infarcts consistent with emboli.  Carotid Dopplers with no ICA stenosis.  Neurology follow-up currently maintained on intravenous heparin awaiting plan to transition to aspirin.  Hospital course acute blood loss anemia 9.6.  Therapy evaluations completed with recommendations of physical medicine rehab consult.   LBM was many days ago- feeling very constipated, uncomfortable, and hasn't had results with PO meds given. Also has condom cath- Had ileus dc'd 2/27,=. Wife notes had stridor since came out of ICU, not since after surgery- never had anything like this before.    Has a little pain currently.  Uses Mg citrate at home when can't have BM. Wife concerned that is slower since CVA.   Review of Systems  Constitutional: Negative for chills and fever.  HENT: Negative for hearing loss.   Eyes: Negative for blurred vision and double vision.  Respiratory: Positive for stridor. Negative for cough and shortness of breath.   Cardiovascular: Negative for chest pain and palpitations.  Gastrointestinal: Positive for nausea and vomiting.       Epigastric pain, GERD  Genitourinary: Positive for urgency. Negative for dysuria, flank pain and hematuria.  Musculoskeletal: Positive for back pain, joint pain and myalgias.  Skin: Negative for rash.  Neurological: Positive for weakness.  All other systems reviewed and are negative.       Past Medical History:  Diagnosis Date  . Chronic back pain      stenosis  . Enlarged prostate    . GERD (gastroesophageal reflux disease)      occasionally will take a zantac(maybe once a month)  . History of colon polyps    .  History of kidney stones    . History of stress test      done 10 yrs. ago, as a baseline   . Hyperlipidemia      takes Crestor daily  . Hypertension      takes Amlodipine daily and Lotensin as well         Past Surgical History:  Procedure Laterality Date  . ANTERIOR LAT LUMBAR FUSION Left 12/15/2014    Procedure: ANTERIOR  LATERAL LUMBAR FUSION 1 LEVEL;  Surgeon: Phylliss Bob, MD;  Location: [facility];  Service: Orthopedics;  Laterality: Left;  Left sided lateral lumbar interbody fusion, lumbar 3-4, posterior spinal fusion, lumbar 3-4 with instrumentation.  . COLONOSCOPY      . ESOPHAGOGASTRODUODENOSCOPY      . fatty tissue removed from stomach      . LUMBAR LAMINECTOMY/DECOMPRESSION MICRODISCECTOMY N/A 07/08/2013    Procedure: LUMBAR LAMINECTOMY/DECOMPRESSION MICRODISCECTOMY;  Surgeon: Sinclair Ship, MD;  Location: [place];  Service: Orthopedics;  Laterality: N/A;  Lumbar 3-4, lumbar 4-5 decompression  . right ankle surgery        as child   . THORACIC AORTIC ENDOVASCULAR STENT GRAFT N/A 10/15/2019    Procedure: THORACIC AORTIC ENDOVASCULAR STENT GRAFT;  Surgeon: Waynetta Sandy, MD;  Location: [facility];  Service: Vascular;  Laterality: N/A;  . TONSILLECTOMY        as a child    History reviewed. No pertinent family history. Social History:  reports that he has quit smoking. His smoking use included cigarettes. He has a 10.00 pack-year smoking history. He has never used smokeless tobacco. He reports that he does not drink alcohol or use drugs. Allergies:       Allergies  Allergen Reactions  . Lyrica [Pregabalin] Nausea Only and Other (See Comments)      Hallucinations and dizziness, also  . Sulfa Antibiotics Other (See Comments)      Reaction not recalled           Medications Prior to Admission  Medication Sig Dispense Refill  . acetaminophen (TYLENOL) 500 MG tablet Take 1,000 mg by mouth every 6 (six) hours as needed for mild pain or headache.       Marland Kitchen amLODipine (NORVASC) 10 MG tablet Take 10 mg by mouth daily.       . Ascorbic Acid (VITAMIN C) 500 MG CAPS Take 500-1,000 mg by mouth daily.      Marland Kitchen aspirin EC 81 MG tablet Take 81 mg by mouth daily.      . benazepril-hydrochlorthiazide (LOTENSIN HCT) 20-25 MG per tablet Take 1 tablet by mouth at bedtime.       Marland Kitchen CALCIUM PO Take 1 tablet by  mouth daily.      . Cyanocobalamin (VITAMIN B-12 PO) Take 1 tablet by mouth daily.      Marland Kitchen HYDROcodone-acetaminophen (NORCO/VICODIN) 5-325 MG tablet Take 1-2 tablets by mouth See admin instructions. Take one to two tablets by mouth every 8-12 hours as needed for pain      . meloxicam (MOBIC) 15 MG tablet Take 15 mg by mouth daily.      . metFORMIN (GLUCOPHAGE) 500 MG tablet Take 500 mg by mouth 2 (two) times daily.      . Multiple Vitamins-Minerals (MULTIVITAL PO) Take 1 tablet by mouth daily.      . rosuvastatin (CRESTOR) 10 MG tablet Take 10 mg by mouth daily.          Home: Home Living Family/patient expects to be discharged  to:: Private residence Living Arrangements: Spouse/significant other Available Help at Discharge: Family, Available 24 hours/day(temporary) Type of Home: House Home Access: Level entry Home Layout: Two level, Able to live on main level with bedroom/bathroom Alternate Level Stairs-Number of Steps: 7 x2 separated by landing Alternate Level Stairs-Rails: Right Bathroom Shower/Tub: Tub/shower unit, Walk-in shower(tub on ground level) Biochemist, clinical: Standard Home Equipment: Environmental consultant - 2 wheels, [place] - single point, Industrial/product designer History: Prior Function Level of Independence: Independent Functional Status:  Mobility: Bed Mobility Overal bed mobility: Needs Assistance Bed Mobility: Sit to Supine Supine to sit: Mod assist Sit to supine: Min assist Transfers Overall transfer level: Needs assistance Equipment used: Rolling walker (2 wheeled) Transfers: Sit to/from Stand Sit to Stand: Mod assist General transfer comment: PT providing verbal cues for hand placement and tactile and verbal cues for forward rocking and lean Ambulation/Gait Ambulation/Gait assistance: Mod assist Gait Distance (Feet): 10 Feet(7', 10') Assistive device: Rolling walker (2 wheeled) Gait Pattern/deviations: Step-to pattern, Decreased step length - right, Decreased step length  - left General Gait Details: pt with short shuffling steps, increased knee flexion during stance phase bilaterally, and increased trunk flexion. Pt with impaired gait sequencing, improves mildly with visual cues for step length and tactile cues for knee and hip extension during gait cycle Gait velocity: reduced Gait velocity interpretation: <1.31 ft/sec, indicative of household ambulator   ADL:   Cognition: Cognition Overall Cognitive Status: Impaired/Different from baseline Orientation Level: Oriented to [place], Oriented to place, Oriented to time, Disoriented to situation Cognition Arousal/Alertness: Awake/alert Behavior During Therapy: WFL for tasks assessed/performed Overall Cognitive Status: Impaired/Different from baseline Area of Impairment: Orientation, Memory, Following commands, Safety/judgement, Problem solving, Awareness Orientation Level: Disoriented to, Time Memory: Decreased recall of precautions, Decreased short-term memory Following Commands: Follows one step commands consistently, Follows multi-step commands with increased time Safety/Judgement: Decreased awareness of safety, Decreased awareness of deficits Awareness: Emergent Problem Solving: Slow processing, Decreased initiation, Difficulty sequencing, Requires verbal cues, Requires tactile cues   Blood pressure (!) 144/76, pulse 87, temperature 99.9 F (37.7 C), temperature source Oral, resp. rate 20, height 5\' 11"  (1.803 m), weight 94 kg, SpO2 97 %. Physical Exam  Nursing note and vitals reviewed. Constitutional: He appears well-developed and well-nourished.  Elderly male sitting up slightly in bed; wife at bedside; strong, progressive stridor while I was in room, no acute distress   HENT:  Head: Normocephalic and atraumatic.  Nose: Nose normal.  Mouth/Throat: Oropharynx is clear and moist.  No specific facial droop noted at rest- didn't want to smile Tongue midline and coated  Eyes: Pupils are equal, round,  and reactive to light. Conjunctivae and EOM are normal.  Saw a little horizontal nystagmus when looking both ways- improved by second testing.  EOMI B/L  Cardiovascular:  RRR- borderline slow-   Respiratory: Stridor present.  Has significant stridor with every breath- also wheezing when listening to lung exam- exp wheezes- also coarse, adequate air movement; no Rhonchi/rales heard.  Stridor/wheezing audible. Also starting to use accessory muscles.   GI:  Very distended; firm; bowel sounds in upper quadrants, but not in lower quadrants, esp not LLQ- quiet- no tinkling.  No rebound; NT  Genitourinary:    Genitourinary Comments: Has condom cath in place   Musculoskeletal:        General: No deformity. Normal range of motion.     Cervical back: Normal range of motion and neck supple.     Comments: Strength tested in biceps, triceps, WE, grip  and Finger abd- 5-/5 in B/L UEs and in LEs, HF, KE, KF, DF and PF- also 5-/5 and B/L- However very slow to move/follow commands.   Neurological: Coordination normal.  Patient is a bit lethargic but arousable.  Follows commands.  Oriented x3. Follows commands however VERY slow to follow and slow to answer questions- answered but no spontaneous speech.  Speech long and drawn out- slow to process as well? Sensation intact in all 4 extremities and face No increased tone noted in all 4 extremities  Skin:  IV in B/L arms- no signs of infiltrate.  No skin breakdown seen.   Psychiatric:  Slow to comprehend and to process/and to follow commands- flat affect      Lab Results Last 24 Hours       Results for orders placed or performed during the hospital encounter of 10/08/19 (from the past 24 hour(s))  Glucose, capillary     Status: Abnormal    Collection Time: 10/19/19  7:27 AM  Result Value Ref Range    Glucose-Capillary 124 (H) 70 - 99 mg/dL  Lactate dehydrogenase     Status: Abnormal    Collection Time: 10/19/19  9:47 AM  Result Value Ref Range     LDH 225 (H) 98 - 192 U/L  Glucose, capillary     Status: Abnormal    Collection Time: 10/19/19 12:10 PM  Result Value Ref Range    Glucose-Capillary 118 (H) 70 - 99 mg/dL  Glucose, capillary     Status: Abnormal    Collection Time: 10/19/19  5:04 PM  Result Value Ref Range    Glucose-Capillary 108 (H) 70 - 99 mg/dL  Glucose, capillary     Status: None    Collection Time: 10/19/19  7:56 PM  Result Value Ref Range    Glucose-Capillary 94 70 - 99 mg/dL  Glucose, capillary     Status: None    Collection Time: 10/20/19  1:42 AM  Result Value Ref Range    Glucose-Capillary 93 70 - 99 mg/dL  Glucose, capillary     Status: None    Collection Time: 10/20/19  4:33 AM  Result Value Ref Range    Glucose-Capillary 90 70 - 99 mg/dL       Imaging Results (Last 48 hours)  DG Abd 1 View   Result Date: 10/18/2019 CLINICAL DATA:  Abdominal distention. EXAM: ABDOMEN - 1 VIEW COMPARISON:  10/16/2019 FINDINGS: Diffuse gaseous distention of small bowel and colon again. Noted cecum is more decompressed than on the prior study in there is gas visible in the left colon today. Thoracic aortic stent graft incompletely visualized. Patient is status post lower lumbar fusion. IMPRESSION: Mild gaseous distention of small bowel and colon. Imaging features are not frankly suggestive of an obstructive pattern and features may reflect ileus. Electronically Signed   By: Misty Stanley M.D.   On: 10/18/2019 11:01    MR ANGIO HEAD WO CONTRAST   Result Date: 10/18/2019 CLINICAL DATA:  Stroke follow-up. 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 EXAM: MRA HEAD WITHOUT CONTRAST TECHNIQUE: Angiographic images of the Circle of Willis were obtained using MRA technique without intravenous contrast. COMPARISON:  04/09/2010 FINDINGS: The study is mildly to moderately motion degraded. The visualized distal vertebral arteries are patent to the basilar with the left being strongly dominant. The basilar  artery is widely patent. Posterior communicating arteries are diminutive or absent. PCAs are patent without evidence of significant proximal stenosis on the right.  A left P1 stenosis is again seen, moderate on the prior MRA and not felt to have progressed within limitations of motion artifact today. The internal carotid arteries are patent from skull base to carotid termini without evidence of significant stenosis allowing for artifact. ACAs and MCAs are patent without evidence of proximal branch occlusion or significant proximal stenosis. No aneurysm is identified. IMPRESSION: 1. Motion degraded examination without medium or large vessel occlusion or significant proximal anterior circulation stenosis. 2. Chronic moderate left P1 PCA stenosis. Electronically Signed   By: Logan Bores M.D.   On: 10/18/2019 17:19    DG Abd Portable 1V-Small Bowel Obstruction Protocol-initial, 8 hr delay   Result Date: 10/19/2019 CLINICAL DATA:  Follow up small bowel obstruction EXAM: PORTABLE ABDOMEN - 1 VIEW COMPARISON:  10/18/2019 FINDINGS: Scattered large and small bowel gas is noted. Recently administered contrast material now lies almost entirely within the colon. A minimal amount in the distal small bowel is seen. Postsurgical changes are noted in the descending thoracic aorta and lower lumbar spine. No free air is seen. IMPRESSION: Recently administered contrast material now lies almost entirely within the colon. No obstructive changes are seen. Electronically Signed   By: Inez Catalina M.D.   On: 10/19/2019 19:52         Assessment/Plan: Diagnosis: multiple embolic CVAs 1. Does the need for close, 24 hr/day medical supervision in concert with the patient's rehab needs make it unreasonable for this patient to be served in a less intensive setting? Yes 2. Co-Morbidities requiring supervision/potential complications: HTN, ABLA, DM with A1c of 6.7; slowed processing, bowel issues; stridor, aortic dissection 3. Due to  bladder management, bowel management, safety, skin/wound care, disease management, medication administration and patient education, does the patient require 24 hr/day rehab nursing? Yes 4. Does the patient require coordinated care of a physician, rehab nurse, therapy disciplines of PT, OT and SLP to address physical and functional deficits in the context of the above medical diagnosis(es)? Yes Addressing deficits in the following areas: balance, endurance, locomotion, strength, transferring, bathing, dressing, feeding, grooming, toileting, cognition and psychosocial support 5. Can the patient actively participate in an intensive therapy program of at least 3 hrs of therapy per day at least 5 days per week? Yes 6. The potential for patient to make measurable gains while on inpatient rehab is good 7. Anticipated functional outcomes upon discharge from inpatient rehab are modified independent  with PT, modified independent with OT, modified independent and supervision with SLP. 8. Estimated rehab length of stay to reach the above functional goals is: 10-14 days 9. Anticipated discharge destination: Home 10. Overall Rehab/Functional Prognosis: good   RECOMMENDATIONS: This patient's condition is appropriate for continued rehabilitative care in the following setting: CIR Patient has agreed to participate in recommended program. Potentially Note that insurance prior authorization may be required for reimbursement for recommended care.   Comment:  Embolic CVAs- in cerebral and cerebellar regions   1. Suggest Mg citrate and if needed, follow in 3-4 hours with enema or suppository to completely clean pt out- he's very full of stool/distended 2.  Suggest ENT to assess stridor if CXR is negative- I'm concerned esp about the timing of when it occurred- not just after surgery.    3. Pt slow to process- if in pain, suggest scheduling low dose tylenol to help so doesn't always have to ask.      Lavon Paganini  Angiulli, PA-C 10/20/2019        Revision History  Routing History

## 2019-10-24 NOTE — Progress Notes (Signed)
Patient refused use of CPAP for the evening. Will continue to monitor pt.

## 2019-10-24 NOTE — Progress Notes (Addendum)
4 Called to give report to [facility], RN is with another patient and will return my call to get report.   74 [facility] RN returned call, this RN gave report. [facility] will call when a bed is ready for patient.   1418 Patient has a bed and will be transferred to [facility]

## 2019-10-24 NOTE — Progress Notes (Signed)
Patient is a new admit to rehab unit from [facility]. Patient is alert and oriented x3. Patient introduced to rehab, given rehab schedule and medication regimen discussed. Rehab policy and expectations discussed.

## 2019-10-24 NOTE — Progress Notes (Signed)
Raechel Ache, OT  Rehab Admission Coordinator  Physical Medicine and Rehabilitation  PMR Pre-admission  Addendum  Date of Service:  10/20/2019  6:16 PM      Related encounter: ED to Hosp-Admission (Discharged) from 10/08/2019 in [facility]        Show:Clear all [x] Manual[x] Template[x] Copied  Added by: [x] Raechel Ache, OT  [] Hover for details PMR Admission Coordinator Pre-Admission Assessment   Patient: [name] [name] is an 74 y.o., male MRN: [id number] DOB: Feb 06, 1946 Height: 5' 10.98" (180.3 cm) Weight: 92.1 kg                                                                                                                                                  Insurance Information HMO: yes    PPO:      PCP:      IPA:      80/20:      OTHER:  PRIMARY: UHC Medicare      Policy#: [id number]      Subscriber: patient CM Name: Tia     Phone#: did not provide ([employer]: [phone])    Fax#: [phone] Pre-Cert#: [id number]      Employer:  Josem Kaufmann provided by Hebert Soho at [facility] for admit to [facility]. Pt is approved for 7 days starting on 3/5 with next review date 3/11. Updated clinicals are to be faxed to (f): [phone] Benefits:  Phone #: online     Name: [email or web address] Eff. Date: 08/21/19- still active     Deduct: does not have ($0)      Out of Pocket Max: $4,500 ($155.00 met)      Life Max: NA CIR: $325/day co-pay for days 1-5, $0/day co-pay for days 6+      SNF: $0/day co-pay for days 1-20, $184/day co-pay for days 21-45, $0/day co-pay for days 46-100; limited to 100 days/cal yr Outpatient: $35/visit co-pay; limited by medical necessity     Home Health: 100% coverage, 0% co-insurance; limited by medical necessity DME: 80% coverage, 20% co-insurance  Providers:  SECONDARY: None      Policy#:       Subscriber:  CM Name:       Phone#:      Fax#:  Pre-Cert#:       Employer:  Benefits:  Phone #:      Name:  Eff. Date:      Deduct:       Out of Pocket Max:       Life Max:  CIR:       SNF: Outpatient:     Co-Pay:  Home Health:       Co-Pay:  DME:      Co-Pay:    Medicaid Application Date:       Case Manager:  Disability Application Date:       Case Worker:  The "Data Collection Information Summary" for patients in Inpatient Rehabilitation Facilities with attached "Privacy Act [place] Records" was provided and verbally reviewed with: Family   Emergency Contact Information         Contact Information     Name Relation Home Work Mobile    Korf,[name] Spouse [phone]   [phone]       Current Medical History  Patient Admitting Diagnosis: multiple embolic CVAs History of Present Illness: [name] [name]. [name] is a 74 year old male with history of chronic back pain with lumbar discectomy 2014 as well as anterior lateral lumbar fusion 2016, BPH, hypertension, diabetes mellitus, hyperlipidemia and remote tobacco abuse. Presented 10/08/2019 with lower chest and back pain with epigastric discomfort times several weeks as well as bouts of nausea and vomiting.  In the ED blood pressure 180s/130s.  EKG showed nonspecific ST-T changes not significantly different from prior EKG 2016.  First troponin HS 28, potassium 3.3, creatinine 1.25, WBC 14,100, hemoglobin 14.5, SARS coronavirus negative.  Chest x-ray no active disease.  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  Small pulmonary embolus involving the central right lobe pulmonary artery as well as probable additional small subsegmental embolus in the right lower lobe.  Echocardiogram with ejection fraction 65%.  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 Donzetta Matters.  Placed on intravenous heparin therapy.  Neurology consulted 10/17/2019 for altered mental status.  CT/MRI showed small acute bilateral cerebral and cerebellar  infarcts consistent with emboli.  Carotid Dopplers with no ICA stenosis.  Neurology follow-up and heparin was transitioned to Eliquis.  Hospital course blood loss anemia 9.3 and monitored with follow-up per GI services there was some fear of possible GI bleed related to intravenous heparin as he was transition to Eliquis no other overt bleeding noted..  Tolerating a regular consistency diet.  Patient is to be admitted for a comprehensive rehab program on 10/24/19.   Complete NIHSS TOTAL: 2 Glasgow Coma Scale Score: 15   Past Medical History      Past Medical History:  Diagnosis Date  . Chronic back pain      stenosis  . Enlarged prostate    . GERD (gastroesophageal reflux disease)      occasionally will take a zantac(maybe once a month)  . History of colon polyps    . History of kidney stones    . History of stress test      done 10 yrs. ago, as a baseline   . Hyperlipidemia      takes Crestor daily  . Hypertension      takes Amlodipine daily and Lotensin as well      Family History  family history is not on file.   Prior Rehab/Hospitalizations:  Has the patient had prior rehab or hospitalizations prior to admission? No   Has the patient had major surgery during 100 days prior to admission? Yes   Current Medications    Current Facility-Administered Medications:  .  acetaminophen (TYLENOL) tablet 650 mg, 650 mg, Oral, Q6H PRN **OR** acetaminophen (TYLENOL) suppository 650 mg, 650 mg, Rectal, Q6H PRN, Lang Snow, FNP .  alum & mag hydroxide-simeth (MAALOX/MYLANTA) 200-200-20 MG/5ML suspension 15-30 mL, 15-30 mL, Oral, Q2H PRN, Rhyne, Samantha J, PA-C .  amLODipine (NORVASC) tablet 10 mg, 10 mg, Oral, Daily, Rhyne, Samantha J, PA-C, 10 mg at 10/23/19 1011 .  apixaban (ELIQUIS)  tablet 5 mg, 5 mg, Oral, BID, Rolla Flatten, RPH, 5 mg at 10/23/19 1010 .  atorvastatin (LIPITOR) tablet 10 mg, 10 mg, Oral, q1800, Rhyne, Samantha J, PA-C, 10 mg at 10/23/19 1717 .  bisacodyl  (DULCOLAX) suppository 10 mg, 10 mg, Rectal, Daily, Charlynne Cousins, MD, 10 mg at 10/23/19 1226 .  carvedilol (COREG) tablet 25 mg, 25 mg, Oral, BID WC, Rhyne, Samantha J, PA-C, 25 mg at 10/23/19 1706 .  Chlorhexidine Gluconate Cloth 2 % PADS 6 each, 6 each, Topical, Daily, Rhyne, Hulen Shouts, PA-C, 6 each at 10/23/19 1227 .  gabapentin (NEURONTIN) tablet 300 mg, 300 mg, Oral, QHS, Rhyne, Samantha J, PA-C, 300 mg at 10/22/19 2138 .  hydrALAZINE (APRESOLINE) injection 10 mg, 10 mg, Intravenous, Q4H, Charlynne Cousins, MD, 10 mg at 10/23/19 1707 .  influenza vaccine adjuvanted (FLUAD) injection 0.5 mL, 0.5 mL, Intramuscular, Tomorrow-1000, Agarwala, Ravi, MD .  insulin aspart (novoLOG) injection 0-15 Units, 0-15 Units, Subcutaneous, TID WC, Rhyne, Samantha J, PA-C, 2 Units at 10/23/19 1703 .  insulin aspart (novoLOG) injection 0-5 Units, 0-5 Units, Subcutaneous, QHS, Rhyne, Samantha J, PA-C .  ipratropium-albuterol (DUONEB) 0.5-2.5 (3) MG/3ML nebulizer solution 3 mL, 3 mL, Nebulization, BID, Charlynne Cousins, MD, 3 mL at 10/23/19 0713 .  labetalol (NORMODYNE) injection 10 mg, 10 mg, Intravenous, Q2H PRN, Charlynne Cousins, MD .  lidocaine (LIDODERM) 5 % 1 patch, 1 patch, Transdermal, Q24H, Kipp Brood, MD, 1 patch at 10/23/19 1328 .  magnesium sulfate IVPB 2 g 50 mL, 2 g, Intravenous, Daily PRN, Rhyne, Samantha J, PA-C .  metoprolol tartrate (LOPRESSOR) injection 2-5 mg, 2-5 mg, Intravenous, Q2H PRN, Rhyne, Samantha J, PA-C .  morphine 2 MG/ML injection 2 mg, 2 mg, Intravenous, Q4H PRN, Charlynne Cousins, MD, 2 mg at 10/19/19 2220 .  ondansetron (ZOFRAN) injection 4 mg, 4 mg, Intravenous, Q6H PRN, Rhyne, Samantha J, PA-C, 4 mg at 10/15/19 1127 .  pantoprazole (PROTONIX) EC tablet 40 mg, 40 mg, Oral, Daily, Rhyne, Samantha J, PA-C, 40 mg at 10/23/19 1009 .  phenol (CHLORASEPTIC) mouth spray 1 spray, 1 spray, Mouth/Throat, PRN, Rhyne, Samantha J, PA-C .  pneumococcal 23 valent  vaccine (PNEUMOVAX-23) injection 0.5 mL, 0.5 mL, Intramuscular, Tomorrow-1000, Agarwala, Ravi, MD .  sodium chloride flush (NS) 0.9 % injection 10-40 mL, 10-40 mL, Intracatheter, PRN, Rhyne, Samantha J, PA-C .  sodium chloride flush (NS) 0.9 % injection 10-40 mL, 10-40 mL, Intracatheter, Q12H, Rhyne, Samantha J, PA-C, 10 mL at 10/23/19 0909 .  sodium chloride flush (NS) 0.9 % injection 10-40 mL, 10-40 mL, Intracatheter, PRN, Rhyne, Samantha J, PA-C .  sodium chloride flush (NS) 0.9 % injection 3 mL, 3 mL, Intravenous, Once, Rhyne, Samantha J, PA-C .  sodium chloride flush (NS) 0.9 % injection 3 mL, 3 mL, Intravenous, Q12H, Rhyne, Samantha J, PA-C, 3 mL at 10/23/19 0910 .  tamsulosin (FLOMAX) capsule 0.4 mg, 0.4 mg, Oral, Daily, Rhyne, Samantha J, PA-C, 0.4 mg at 10/23/19 1013   Patients Current Diet:     Diet Order                      Diet - low sodium heart healthy           Diet Heart Room service appropriate? Yes; Fluid consistency: Thin  Diet effective now                   Precautions / Restrictions Precautions Precautions: Fall Restrictions Weight  Bearing Restrictions: No    Has the patient had 2 or more falls or a fall with injury in the past year?No   Prior Activity Level Community (5-7x/wk): retired, drives still, independent PTA. Did use RW in house but nothing out in the community   Prior Functional Level Prior Function Level of Independence: Independent Comments: pt likes to play electric guitar, sing and be with his grandchildren and great grandchildren   Self Care: Did the patient need help bathing, dressing, using the toilet or eating?  Independent   Indoor Mobility: Did the patient need assistance with walking from room to room (with or without device)? Independent   Stairs: Did the patient need assistance with internal or external stairs (with or without device)? Independent   Functional Cognition: Did the patient need help planning regular tasks such as  shopping or remembering to take medications? Independent   Home Assistive Devices / Equipment Home Assistive Devices/Equipment: None Home Equipment: Environmental consultant - 2 wheels, Cane - single point, Transport chair   Prior Device Use: Indicate devices/aids used by the patient prior to current illness, exacerbation or injury? Walker   Current Functional Level Cognition   Overall Cognitive Status: Impaired/Different from baseline Current Attention Level: Sustained Orientation Level: Oriented X4 Following Commands: Follows one step commands with increased time Safety/Judgement: Decreased awareness of safety, Decreased awareness of deficits General Comments: Pt pleasant and motivated to participate. Wife present. Increased processing time. Cues to stay on task. Continual sequencing cues.    Extremity Assessment (includes Sensation/Coordination)   Upper Extremity Assessment: RUE deficits/detail, LUE deficits/detail RUE Deficits / Details: 4-5 shoulder, 4+/5 elbow to hand LUE Deficits / Details: 4-/5 shoulder, 4/5 elbow to hand  Lower Extremity Assessment: Defer to PT evaluation     ADLs   Overall ADL's : Needs assistance/impaired Eating/Feeding: Set up, Bed level Grooming: Wash/dry hands, Wash/dry face, Brushing hair, Set up, Sitting Grooming Details (indicate cue type and reason): min assist for sitting balance Upper Body Bathing: Moderate assistance, Sitting Upper Body Bathing Details (indicate cue type and reason): assisted for back Lower Body Bathing: Sit to/from stand, Total assistance Upper Body Dressing : Minimal assistance, Sitting Lower Body Dressing: Total assistance, Sit to/from stand Toilet Transfer: Moderate assistance, RW Toileting- Clothing Manipulation and Hygiene: Moderate assistance, Sitting/lateral lean, Sit to/from stand Functional mobility during ADLs: Minimal assistance, Rolling walker, +2 for safety/equipment, Moderate assistance General ADL Comments: sitting balance  interfering with ability to perform ADL in sitting, dependent on UEs for balance     Mobility   Overal bed mobility: Needs Assistance Bed Mobility: Supine to Sit Rolling: Mod assist Sidelying to sit: Mod assist Supine to sit: Mod assist, HOB elevated Sit to supine: Max assist General bed mobility comments: pt in chair upon OTs arrival     Transfers   Overall transfer level: Needs assistance Equipment used: Rolling walker (2 wheeled) Transfer via Lift Equipment: Stedy Transfers: Sit to/from Stand Sit to Stand: Mod assist General transfer comment: pulled up on sink     Ambulation / Gait / Stairs / Wheelchair Mobility   Ambulation/Gait Ambulation/Gait assistance: Mod assist, +2 safety/equipment Gait Distance (Feet): 15 Feet(+10) Assistive device: Rolling walker (2 wheeled) Gait Pattern/deviations: Step-through pattern, Trunk flexed, Decreased stride length General Gait Details: assist for RW management and to stabilize balance. Continual sequencing cues. Increased overall flexion with fatigue. Ambulated 10' then 15' with RW. 3-minute seated rest break in between. +2 for close chair follow. Gait velocity: decreased Gait velocity interpretation: <1.31 ft/sec, indicative of  household ambulator     Posture / Balance Dynamic Sitting Balance Sitting balance - Comments: posterior lean, verbal and tactile cuing for correcting posture successful but difficult for pt to sustain Balance Overall balance assessment: Needs assistance Sitting-balance support: Feet supported, Bilateral upper extremity supported Sitting balance-Leahy Scale: Fair Sitting balance - Comments: posterior lean, verbal and tactile cuing for correcting posture successful but difficult for pt to sustain Postural control: Posterior lean Standing balance support: Bilateral upper extremity supported, During functional activity Standing balance-Leahy Scale: Poor Standing balance comment: reliant on  B UE and external support       Special needs/care consideration BiPAP/CPAP: no prior history; did use Bipap on 3/2  CPM: no Continuous Drip IV: no Dialysis: no        Days: no Life Vest: no Oxygen: RA vs 2L Special Bed: no Trach Size: no Wound Vac (area): no      Location: no Skin: blister to Left upper arm, skin tear to mid anterior scrotum; closed incision to right groin          Bowel mgmt: last BM 10/21/19, incontinent Bladder mgmt: incontinent, external urinary catheter in place Diabetic mgmt: no Behavioral consideration : some slow processing, confusion  Chemo/radiation : no        Previous Home Environment (from acute therapy documentation) Living Arrangements: Spouse/significant other Available Help at Discharge: Family, Available 24 hours/day Type of Home: House Home Layout: Two level, Able to live on main level with bedroom/bathroom Alternate Level Stairs-Rails: Right Alternate Level Stairs-Number of Steps: 7 x2 separated by landing Home Access: Level entry Bathroom Shower/Tub: Tub/shower unit, Walk-in shower(tub on ground level) Biochemist, clinical: [place]: No   Discharge Living Setting Plans for Discharge Living Setting: Patient's home, Lives with (comment)(wife) Type of Home at Discharge: House Discharge Home Layout: Two level(master upstairs, but full bed/bath downstairs as needed) Alternate Level Stairs-Rails: Right Alternate Level Stairs-Number of Steps: 7 steps, landing, 7 steps (14 tota) Discharge Home Access: Level entry Discharge Bathroom Shower/Tub: Tub/shower unit, Walk-in shower(walk in upstairs; tub shower downstaris) Discharge Bathroom Toilet: Standard Discharge Bathroom Accessibility: Yes How Accessible: Accessible via wheelchair, Accessible via walker(downstairs option) Does the patient have any problems obtaining your medications?: No   Social/Family/Support Systems Patient Roles: Spouse Contact Information: wife: Barbaraann Share [phone] Anticipated  Caregiver: wife Barbaraann Share Anticipated Caregiver's Contact Information: see above Ability/Limitations of Caregiver: Min A Caregiver Availability: 24/7 Discharge Plan Discussed with Primary Caregiver: Yes(pt and wife) Is Caregiver In Agreement with Plan?: Yes Does Caregiver/Family have Issues with Lodging/Transportation while Pt is in Rehab?: No     Goals/Additional Needs Patient/Family Goal for Rehab: PT/OT: Mod I; SLP: Mod I and Supervision Expected length of stay: 10-14 days Cultural Considerations: Jewish faith  Dietary Needs: heart healthy, thin liquids.  Equipment Needs: TBD Pt/Family Agrees to Admission and willing to participate: Yes Program Orientation Provided & Reviewed with Pt/Caregiver Including Roles  & Responsibilities: Yes(pt and his wife)  Barriers to Discharge: Home environment access/layout, Incontinence  Barriers to Discharge Comments: 14 steps to get to master bed/bath.      Decrease burden of Care through IP rehab admission: NA     Possible need for SNF placement upon discharge: Not anticipated; pt has good prognosis for functional gains through CIR. Pt has good family support and anticipate he can reach goals in a reasonable amount of time.      Patient Condition: This patient's medical and functional status has changed since the consult dated: 10/20/19 in which  the Rehabilitation Physician determined and documented that the patient's condition is appropriate for intensive rehabilitative care in an inpatient rehabilitation facility. See "History of Present Illness" (above) for medical update. Functional changes are: slight improvement in ambulation distance from Mod A 10 feet to Mod A + 2 for 15 feet (+10). Patient's medical and functional status update has been discussed with the Rehabilitation physician and patient remains appropriate for inpatient rehabilitation. Will admit to inpatient rehab Saturday 10/24/19.   Preadmission Screen Completed By:  Raechel Ache, OT,  10/23/2019 5:25 PM ______________________________________________________________________   Discussed status with Dr. Ranell Patrick on 10/23/19 at 3:25PM and received approval for admission Saturday, 10/24/19.   Admission Coordinator:  Raechel Ache, time 5:23Pm/Date 10/23/19     Cosigned by: Izora Ribas, MD at 10/23/2019  5:49 PM  Revision History

## 2019-10-24 NOTE — H&P (Signed)
Physical Medicine and Rehabilitation Admission H&P  CC: Bilateral cerebral and cerebellar infarcts   HPI: [name] [name]. [name] [name] is a 74 year old right-handed male with history of chronic back pain with lumbar discectomy 2014 as well as anterior lateral lumbar fusion 2016, BPH, hypertension, diabetes mellitus, hyperlipidemia and remote tobacco abuse.  Per chart review lives with spouse independent prior to admission.  Two-level home bed and bath on main level.  Presented 10/08/2019 with lower chest and back pain with epigastric discomfort times several weeks as well as bouts of nausea and vomiting.  In the ED blood pressure 180s/130s.  EKG showed nonspecific ST-T changes not significantly different from prior EKG 2016.  First troponin HS 28, potassium 3.3, creatinine 1.25, WBC 14,100, hemoglobin 14.5, SARS coronavirus negative.  Chest x-ray no active disease.  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  Small pulmonary embolus involving the central right lobe pulmonary artery as well as probable additional small subsegmental embolus in the right lower lobe.  Echocardiogram with ejection fraction 65%.  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 Donzetta Matters.  Placed on intravenous heparin therapy.  Neurology consulted 10/17/2019 for altered mental status.  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  Carotid Dopplers with no ICA stenosis.  Neurology follow-up and heparin was transitioned to Eliquis.  Hospital course blood loss anemia 9.3 and monitored with follow-up per GI services there was some fear of possible GI bleed related to intravenous heparin as he was transition to Eliquis no other overt bleeding noted..  Tolerating a regular consistency diet.  Patient was admitted for a comprehensive  rehab program.  Review of Systems  Constitutional: Negative for chills and fever.  HENT: Negative for hearing loss.   Eyes: Negative for blurred vision and double vision.  Respiratory: Negative for cough and shortness of breath.   Cardiovascular: Positive for chest pain. Negative for leg swelling.  Gastrointestinal: Positive for nausea and vomiting.       Epigastric discomfort  Genitourinary: Positive for urgency. Negative for dysuria, flank pain and hematuria.  Musculoskeletal: Positive for back pain.  Skin: Negative for rash.  All other systems reviewed and are negative.  Past Medical History:  Diagnosis Date  . Chronic back pain    stenosis  . Enlarged prostate   . GERD (gastroesophageal reflux disease)    occasionally will take a zantac(maybe once a month)  . History of colon polyps   . History of kidney stones   . History of stress test    done 10 yrs. ago, as a baseline   . Hyperlipidemia    takes Crestor daily  . Hypertension    takes Amlodipine daily and Lotensin as well   Past Surgical History:  Procedure Laterality Date  . ANTERIOR LAT LUMBAR FUSION Left 12/15/2014   Procedure: ANTERIOR LATERAL LUMBAR FUSION 1 LEVEL;  Surgeon: Phylliss Bob, MD;  Location: [facility];  Service: Orthopedics;  Laterality: Left;  Left sided lateral lumbar interbody fusion, lumbar 3-4, posterior spinal fusion, lumbar 3-4 with instrumentation.  . COLONOSCOPY    . ESOPHAGOGASTRODUODENOSCOPY    . fatty tissue removed from stomach    . LUMBAR LAMINECTOMY/DECOMPRESSION MICRODISCECTOMY N/A 07/08/2013   Procedure: LUMBAR LAMINECTOMY/DECOMPRESSION MICRODISCECTOMY;  Surgeon: Sinclair Ship, MD;  Location: [facility];  Service: Orthopedics;  Laterality: N/A;  Lumbar 3-4, lumbar 4-5  decompression  . RADIOLOGY WITH ANESTHESIA N/A 10/22/2019   Procedure: MRI WITH ANESTHESIA OF LUMBAR SPINE WITHOUT CONTRAST;  Surgeon: Radiologist, Medication, MD;  Location: [facility];  Service: Radiology;  Laterality: N/A;  .  right ankle surgery     as child   . THORACIC AORTIC ENDOVASCULAR STENT GRAFT N/A 10/15/2019   Procedure: THORACIC AORTIC ENDOVASCULAR STENT GRAFT;  Surgeon: Waynetta Sandy, MD;  Location: [facility];  Service: Vascular;  Laterality: N/A;  . TONSILLECTOMY     as a child   History reviewed. No pertinent family history. Social History:  reports that he has quit smoking. His smoking use included cigarettes. He has a 10.00 pack-year smoking history. He has never used smokeless tobacco. He reports that he does not drink alcohol or use drugs. Allergies:  Allergies  Allergen Reactions  . Lyrica [Pregabalin] Nausea Only and Other (See Comments)    Hallucinations and dizziness, also  . Sulfa Antibiotics Other (See Comments)    Reaction not recalled    Medications Prior to Admission  Medication Sig Dispense Refill  . acetaminophen (TYLENOL) 500 MG tablet Take 1,000 mg by mouth every 6 (six) hours as needed for mild pain or headache.     Marland Kitchen amLODipine (NORVASC) 10 MG tablet Take 10 mg by mouth daily.     Marland Kitchen apixaban (ELIQUIS) 5 MG TABS tablet Take 1 tablet (5 mg total) by mouth 2 (two) times daily. 60 tablet   . Ascorbic Acid (VITAMIN C) 500 MG CAPS Take 500-1,000 mg by mouth daily.    Marland Kitchen aspirin EC 81 MG tablet Take 81 mg by mouth daily.    . benazepril-hydrochlorthiazide (LOTENSIN HCT) 20-25 MG per tablet Take 1 tablet by mouth at bedtime.     Marland Kitchen CALCIUM PO Take 1 tablet by mouth daily.    . carvedilol (COREG) 25 MG tablet Take 1 tablet (25 mg total) by mouth 2 (two) times daily with a meal.    . Cyanocobalamin (VITAMIN B-12 PO) Take 1 tablet by mouth daily.    Marland Kitchen gabapentin (NEURONTIN) 600 MG tablet Take 0.5 tablets (300 mg total) by mouth at bedtime.    . metFORMIN (GLUCOPHAGE) 500 MG tablet Take 500 mg by mouth 2 (two) times daily.    . pantoprazole (PROTONIX) 40 MG tablet Take 1 tablet (40 mg total) by mouth daily.    . rosuvastatin (CRESTOR) 10 MG tablet Take 10 mg by mouth daily.    .  tamsulosin (FLOMAX) 0.4 MG CAPS capsule Take 1 capsule (0.4 mg total) by mouth daily. 30 capsule     Drug Regimen Review Drug regimen was reviewed and remains appropriate with no significant issues identified  Home: Home Living Family/patient expects to be discharged to:: Private residence Living Arrangements: Spouse/significant other Available Help at Discharge: Family, Available 24 hours/day Type of Home: House Home Access: Level entry Home Layout: Two level, Able to live on main level with bedroom/bathroom Alternate Level Stairs-Number of Steps: 7 x2 separated by landing Alternate Level Stairs-Rails: Right Bathroom Shower/Tub: Tub/shower unit, Walk-in shower(tub on ground level) Biochemist, clinical: Standard Home Equipment: Environmental consultant - 2 wheels, Cane - single point, Futures trader History: Prior Function Level of Independence: Independent Comments: pt likes to play Art gallery manager, sing and be with his grandchildren and great grandchildren  Functional Status:  Mobility: Bed Mobility Overal bed mobility: Needs Assistance Bed Mobility: Supine to Sit Rolling: Mod assist Sidelying to sit: Mod assist Supine to sit: Mod assist, HOB elevated Sit  to supine: Max assist General bed mobility comments: pt in chair upon OTs arrival Transfers Overall transfer level: Needs assistance Equipment used: Rolling walker (2 wheeled) Transfer via Lift Equipment: Stedy Transfers: Sit to/from Stand Sit to Stand: Mod assist General transfer comment: pulled up on sink Ambulation/Gait Ambulation/Gait assistance: Mod assist, +2 safety/equipment Gait Distance (Feet): 15 Feet(+10) Assistive device: Rolling walker (2 wheeled) Gait Pattern/deviations: Step-through pattern, Trunk flexed, Decreased stride length General Gait Details: assist for RW management and to stabilize balance. Continual sequencing cues. Increased overall flexion with fatigue. Ambulated 10' then 15' with RW. 3-minute  seated rest break in between. +2 for close chair follow. Gait velocity: decreased Gait velocity interpretation: <1.31 ft/sec, indicative of household ambulator  ADL: ADL Overall ADL's : Needs assistance/impaired Eating/Feeding: Set up, Bed level Grooming: Wash/dry hands, Wash/dry face, Brushing hair, Set up, Sitting Grooming Details (indicate cue type and reason): min assist for sitting balance Upper Body Bathing: Moderate assistance, Sitting Upper Body Bathing Details (indicate cue type and reason): assisted for back Lower Body Bathing: Sit to/from stand, Total assistance Upper Body Dressing : Minimal assistance, Sitting Lower Body Dressing: Total assistance, Sit to/from stand Toilet Transfer: Moderate assistance, RW Toileting- Clothing Manipulation and Hygiene: Moderate assistance, Sitting/lateral lean, Sit to/from stand Functional mobility during ADLs: Minimal assistance, Rolling walker, +2 for safety/equipment, Moderate assistance General ADL Comments: sitting balance interfering with ability to perform ADL in sitting, dependent on UEs for balance  Cognition: Cognition Overall Cognitive Status: Impaired/Different from baseline Orientation Level: Oriented X4 Cognition Arousal/Alertness: Awake/alert Behavior During Therapy: Flat affect Overall Cognitive Status: Impaired/Different from baseline Area of Impairment: Orientation, Memory, Following commands, Safety/judgement, Awareness, Problem solving Orientation Level: Disoriented to, Situation Current Attention Level: Sustained Memory: Decreased short-term memory Following Commands: Follows one step commands with increased time Safety/Judgement: Decreased awareness of safety, Decreased awareness of deficits Awareness: Intellectual Problem Solving: Slow processing, Decreased initiation, Difficulty sequencing, Requires verbal cues, Requires tactile cues General Comments: Pt pleasant and motivated to participate. Wife present.  Increased processing time. Cues to stay on task. Continual sequencing cues.   Physical Exam: Blood pressure (!) 142/81, pulse 74, temperature 99.3 F (37.4 C), temperature source Oral, resp. rate 20, SpO2 95 %. General: Alert and oriented x 2 (states month is February), No apparent distress HEENT: Head is normocephalic, atraumatic, PERRLA, EOMI, sclera anicteric, oral mucosa pink and moist, dentition intact, ext ear canals clear,  Neck: Supple without JVD or lymphadenopathy Heart: Reg rate and rhythm. No murmurs rubs or gallops Chest: CTA bilaterally without wheezes, rales, or rhonchi; no distress Abdomen: Soft, non-tender, distended, bowel sounds positive. Extremities: No clubbing, cyanosis, or edema. Pulses are 2+ Skin: Clean and intact without signs of breakdown Neuro: Pt is cognitively appropriate with normal insight, memory, and awareness. Cranial nerves 2-12 are intact. Delayed processing. Follows commands. ModA bed mobility. 4+/5 strength throughout Psych: Pt's affect is appropriate. Pt is cooperative   Results for orders placed or performed during the hospital encounter of 10/08/19 (from the past 48 hour(s))  Glucose, capillary     Status: Abnormal   Collection Time: 10/22/19  5:13 PM  Result Value Ref Range   Glucose-Capillary 126 (H) 70 - 99 mg/dL    Comment: Glucose reference range applies only to samples taken after fasting for at least 8 hours.  Glucose, capillary     Status: Abnormal   Collection Time: 10/22/19  8:54 PM  Result Value Ref Range   Glucose-Capillary 121 (H) 70 - 99 mg/dL    Comment:  Glucose reference range applies only to samples taken after fasting for at least 8 hours.  CBC     Status: Abnormal   Collection Time: 10/23/19  3:46 AM  Result Value Ref Range   WBC 9.2 4.0 - 10.5 K/uL   RBC 3.21 (L) 4.22 - 5.81 MIL/uL   Hemoglobin 9.3 (L) 13.0 - 17.0 g/dL   HCT 29.5 (L) 39.0 - 52.0 %   MCV 91.9 80.0 - 100.0 fL   MCH 29.0 26.0 - 34.0 pg   MCHC 31.5 30.0  - 36.0 g/dL   RDW 14.6 11.5 - 15.5 %   Platelets 318 150 - 400 K/uL   nRBC 0.0 0.0 - 0.2 %    Comment: Performed at [facility], [place] [address], [place], [place] [address]  Glucose, capillary     Status: Abnormal   Collection Time: 10/23/19  7:52 AM  Result Value Ref Range   Glucose-Capillary 110 (H) 70 - 99 mg/dL    Comment: Glucose reference range applies only to samples taken after fasting for at least 8 hours.  Glucose, capillary     Status: Abnormal   Collection Time: 10/23/19  8:01 AM  Result Value Ref Range   Glucose-Capillary 106 (H) 70 - 99 mg/dL    Comment: Glucose reference range applies only to samples taken after fasting for at least 8 hours.  Glucose, capillary     Status: Abnormal   Collection Time: 10/23/19 11:48 AM  Result Value Ref Range   Glucose-Capillary 152 (H) 70 - 99 mg/dL    Comment: Glucose reference range applies only to samples taken after fasting for at least 8 hours.  Glucose, capillary     Status: Abnormal   Collection Time: 10/23/19  1:42 PM  Result Value Ref Range   Glucose-Capillary 156 (H) 70 - 99 mg/dL    Comment: Glucose reference range applies only to samples taken after fasting for at least 8 hours.  CBC     Status: Abnormal   Collection Time: 10/24/19  5:00 AM  Result Value Ref Range   WBC 9.5 4.0 - 10.5 K/uL   RBC 3.24 (L) 4.22 - 5.81 MIL/uL   Hemoglobin 9.4 (L) 13.0 - 17.0 g/dL   HCT 29.5 (L) 39.0 - 52.0 %   MCV 91.0 80.0 - 100.0 fL   MCH 29.0 26.0 - 34.0 pg   MCHC 31.9 30.0 - 36.0 g/dL   RDW 14.5 11.5 - 15.5 %   Platelets 369 150 - 400 K/uL   nRBC 0.0 0.0 - 0.2 %    Comment: Performed at [facility], [place] [address], [place], [place] [address]   No results found.  Medical Problem List and Plan: 1.  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   Antithrombotics: -DVT/anticoagulation/pulmonary emboli: Eliquis  -antiplatelet therapy: N/A 3. Pain Management: Neurontin 300 mg nightly, Lidoderm patch. Well controlled.  4. Mood: Provide emotional support  -antipsychotic agents: N/A 5. Neuropsych: This patient is capable of making decisions on his own behalf. 6. Skin/Wound Care: Routine skin checks 7. Fluids/Electrolytes/Nutrition: Routine in and outs with follow-up chemistries 8.  Acute blood loss anemia.  Follow-up CBC 9.  Hypertension.  Norvasc 10 mg daily, Coreg 25 mg twice daily, hydralazine as directed 10.  BPH.  Flomax 11.  Diabetes mellitus.  Hemoglobin A1c 6.7.  SSI.  Patient on Glucophage 500 mg twice daily prior to admission.  Resume as needed 12.  Hyperlipidemia.  Lipitor 13. Constipation: Magnesium citrate. If needed follow in 3-4 hours with enema to completely clean patient out.  14. Disposition: Lives with wife who is at home and will be able to help him. Has 3 daughters whom he is very close with who also live nearby.   Lavon Paganini Angiulli, PA-C 10/24/2019  I have personally performed a face to face diagnostic evaluation, including, but not limited to relevant history and physical exam findings, of this patient and developed relevant assessment and plan.  Additionally, I have reviewed and concur with the physician assistant's documentation above.  The patient's status has not changed. The original post admission physician evaluation remains appropriate, and any changes from the pre-admission screening or documentation from the acute chart are noted above.   Leeroy Cha, MD

## 2019-10-24 NOTE — Progress Notes (Signed)
Physical Therapy Treatment Patient Details Name: [name] [name] MRN: [id number] DOB: Dec 23, 1945 Today's Date: 10/24/2019    History of Present Illness 74 year old man with 10/10 lower chest and epigastric pain. Has been on/off for several weeks. Has been complaining of back pain as well and is scheduled for spinal stenosis surgery but now states that now both the back and abdominal pains are simultaneous. 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    PT Comments    Pt supine in bed with [facility] with wife present. Vitals WNL throughout tx. Pt should be leaving unit to CIR later today. Pt transfers supine>sit with mod A for hip positioning and to maintain an upright trunk. Pt heavily relies on UE support until pt is EOB and able to adjust to this position. Pt demonstrates a posterior lean and has difficulty forwardly flexing his trunk longer than a minute at a time. Pt transfers to RW with min A. He requires max cuing to maintain an upright position. Pt has trouble staying upright and posteriorly tilting his pelvis. Improved posture with assistance and tactile cuing. Pt able to ambulate 34' today with RW and mod A x1 & 2. Mod A x 1 for first 28' and then mod A x 2 for second half due to fatigue and difficulty with upright posture/ RW usage. Continue to gait train with RW and progress functional mobility within tolerance. D/C plans remain appropriate. PT will continue to follow acutely.    Follow Up Recommendations  CIR;Supervision/Assistance - 24 hour     Equipment Recommendations  Other (comment)(defer to next venue)       Precautions / Restrictions Precautions Precautions: Fall Restrictions Weight Bearing Restrictions: No    Mobility  Bed Mobility Overal bed mobility: Needs Assistance Bed Mobility: Supine to Sit     Supine to sit: Mod  assist;HOB elevated     General bed mobility comments: Pt requires assistance for upright trunk positioning. He tends to lean posteriorly without cuing and assistance.  Transfers Overall transfer level: Needs assistance Equipment used: Rolling walker (2 wheeled) Transfers: Sit to/from Stand Sit to Stand: Min assist         General transfer comment: Pt able to stand with minA for forward weight shifting and steadiness. Demonstrates good initation strength.  Ambulation/Gait Ambulation/Gait assistance: Mod assist;+2 safety/equipment;+2 physical assistance(+1 for half of time, +2 for second half when pt fatiguing) Gait Distance (Feet): 28 Feet Assistive device: Rolling walker (2 wheeled) Gait Pattern/deviations: Step-through pattern;Trunk flexed;Decreased stride length;Shuffle;Decreased step length - right;Decreased step length - left Gait velocity: decreased Gait velocity interpretation: 1.31 - 2.62 ft/sec, indicative of limited community ambulator General Gait Details: Max cuing for upright trunk position, tactile cuing to simulate posterior pelvic tilt. Mod A to help pt avoid a forward flexed position when he fatigues, steadiness, weight shifting, and RW management.          Balance Overall balance assessment: Needs assistance Sitting-balance support: Feet supported;Bilateral upper extremity supported Sitting balance-Leahy Scale: Fair Sitting balance - Comments: Pt able to maintain balance without UE support for short increments (approx 1 minute). When pt fatigues he requires UE support. Postural control: Posterior lean Standing balance support: Bilateral upper extremity supported;During functional activity Standing balance-Leahy Scale: Poor Standing balance comment: reliant on  B UE and external support  Cognition Arousal/Alertness: Awake/alert Behavior During Therapy: Flat affect Overall Cognitive Status: Impaired/Different from  baseline Area of Impairment: Orientation;Memory;Following commands;Safety/judgement;Awareness;Problem solving                 Orientation Level: Disoriented to;Situation Current Attention Level: Sustained Memory: Decreased short-term memory Following Commands: Follows one step commands with increased time Safety/Judgement: Decreased awareness of safety;Decreased awareness of deficits Awareness: Intellectual Problem Solving: Slow processing;Decreased initiation;Difficulty sequencing;Requires verbal cues;Requires tactile cues General Comments: Pt pleasant and motivated to participate. Wife present. Increased processing time. Cues to stay on task. Continual sequencing cues.      Exercises General Exercises - Lower Extremity Long Arc Quad: AROM;Both;10 reps;Seated Hip Flexion/Marching: AROM;Both;10 reps;Seated(cuing for sequencing)    General Comments General comments (skin integrity, edema, etc.): Pt on room air. SpO2 remains at 98-99% throughout tx. Vitals WNL. Wife present throughout tx and encouraging to pt.      Pertinent Vitals/Pain Pain Assessment: No/denies pain           PT Goals (current goals can now be found in the care plan section) Acute Rehab PT Goals Patient Stated Goal: home PT Goal Formulation: With patient/family Time For Goal Achievement: 10/30/19 Potential to Achieve Goals: Good Progress towards PT goals: Progressing toward goals    Frequency    Min 4X/week      PT Plan Current plan remains appropriate       AM-PAC PT "6 Clicks" Mobility   Outcome Measure  Help needed turning from your back to your side while in a flat bed without using bedrails?: A Little Help needed moving from lying on your back to sitting on the side of a flat bed without using bedrails?: A Lot Help needed moving to and from a bed to a chair (including a wheelchair)?: A Lot Help needed standing up from a chair using your arms (e.g., wheelchair or bedside chair)?: A  Little Help needed to walk in hospital room?: A Lot Help needed climbing 3-5 steps with a railing? : Total 6 Click Score: 13    End of Session Equipment Utilized During Treatment: Gait belt Activity Tolerance: Patient tolerated treatment well Patient left: in chair;with family/visitor present Nurse Communication: Mobility status PT Visit Diagnosis: Unsteadiness on feet (R26.81);Muscle weakness (generalized) (M62.81)     Time: [id number] PT Time Calculation (min) (ACUTE ONLY): 41 min  Charges:  $Gait Training: 8-22 mins $Therapeutic Exercise: 8-22 mins $Therapeutic Activity: 8-22 mins                     Jodelle Green, PT, DPT Acute Rehabilitation Services Office [phone]    Jodelle Green 10/24/2019, 2:34 PM

## 2019-10-24 NOTE — Discharge Summary (Signed)
Physician Discharge Summary  [name] [name] [id number] DOB: Feb 15, 1946 DOA: 10/08/2019  PCP: Wenda Low, MD  Admit date: 10/08/2019 Discharge date: 10/24/2019  Admitted From: Home Disposition: CIR  Recommendations for Outpatient Follow-up:  1. Follow up with PCP in 1-2 weeks 2. Please obtain BMP/CBC in one week your next doctors visit.  3. Eliquis twice daily 4. Daily bowel regimen as needed  Discharge Condition: Stable CODE STATUS: Full code Diet recommendation: 2 g salt  Brief/Interim Summary: 74 y.o. male past medical history of BPH hypertension, nephrolithiasis and hyperlipidemia presents with lower chest and epigastric pain for several weeks intermittent.  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  Vascular surgery was consulted who recommended medical management and placed on beta-blocker.  Repeated CTA on 10/12/2019 however showed an aneurysmal dilation had increased to 5.9, so vascular surgery proceeded with vascular repair on 10/15/2019 with percutaneous endograft.  On 10/17/2019 the of the head showed right cerebellum and right occipital hypodensity suspicious for infarct.  MRI of the brain showed bilateral suborbital infarcts and cerebral infarction neurology was consulted.  Significant and studies: CTA CAP 2/18 - aneurysmal dilation of thoracic and abdominal aorta, small PE in RUL pulmonary artery and small subsegmental PE in RLL, soft tissue density in the left common iliac vein CT head 2/27 - Subtle are of gray-white differentiation loss in posterior right occipital lobe, no mass effect. Subtle hypodensity in right cerebellum,. Findings suspicious for acute to subacute infarct particularly in the right occipital lobe MR Brain 2/27 - small acute bilateral cerebral and cerebellar infarcts.   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  Goal blood pressure systolic less than [id number]. Continue current blood pressure medications. For pain he is currently on morphine and oxycodone, he was continuing gabapentin.  Bilateral small pulmonary embolism seen on CT angio on 10/08/2019: Lower extremity Dopplers negative for DVT.  Intermittent heparin was stopped now on Eliquis without evidence of minimal GI bleeding.  Seen by GI.  Bilateral cerebellar infarcts and cerebral infarct: HgbA1c 6.7, started on statin therapy LDL 72 probably need to go on Crestor. MRI, MRA of the brain without contrast as above PT, OT they both recommended CIR. Carotid dopplers unremarkable Transthoracic Echo source of emboli identified.  With an EF of 60%. Now on Eliquis.  Acute kidney injury: Resolved.  New ileus likely contributing to his abdominal pain in the setting of opioid use: As needed bowel regimen.  Out of bed to chair.  Improved with mobility.  Controlled diabetes mellitus with peripheral circulatory disorder: Resume home medications.  BPH: Continue Flomax.  Acute Lower GI bleed: Currently resolved.  Seen by GI.  Now on Eliquis.  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  Discharge Exam: Vitals:   10/24/19 0603 10/24/19 0800  BP: (!) 141/82 (!) 157/86  Pulse:  76  Resp:  20  Temp:  97.8 F (36.6 C)  SpO2:  99%   Vitals:   10/23/19 2253 10/24/19 0206 10/24/19 0603 10/24/19 0800  BP:  139/74 (!)  141/82 (!) 157/86  Pulse:  68  76  Resp:    20  Temp: 100.1 F (37.8 C)   97.8 F (36.6 C)  TempSrc:    Oral  SpO2:    99%  Weight:      Height:        General: Pt is alert, awake, not in acute distress Cardiovascular: RRR, S1/S2 +, no rubs, no gallops Respiratory: CTA bilaterally, no wheezing, no rhonchi Abdominal: Soft, NT, ND, bowel sounds + Extremities: no edema, no cyanosis  Discharge Instructions  Discharge Instructions    Ambulatory referral to Neurology   Complete by: As directed    Follow up in stroke clinic at [facility] MD in 8 weeks (following inpatient rehab where he should go in a few days)   Diet - low sodium heart healthy   Complete by: As directed    Increase activity slowly   Complete by: As directed      Allergies as of 10/24/2019      Reactions   Lyrica [pregabalin] Nausea Only, Other (See Comments)   Hallucinations and dizziness, also   Sulfa Antibiotics Other (See Comments)   Reaction not recalled       Medication List    STOP taking these medications   HYDROcodone-acetaminophen 5-325 MG tablet Commonly known as: NORCO/VICODIN   meloxicam 15 MG tablet Commonly known as: MOBIC   MULTIVITAL PO     TAKE these medications   acetaminophen 500 MG tablet Commonly known as: TYLENOL Take 1,000 mg by mouth every 6 (six) hours as needed for mild pain or headache.   amLODipine 10 MG tablet Commonly known as: NORVASC Take 10 mg by mouth daily.   apixaban 5 MG Tabs tablet Commonly known as: ELIQUIS Take 1 tablet (5 mg total) by mouth 2 (two) times daily.   aspirin EC 81 MG tablet Take 81 mg by mouth daily.   benazepril-hydrochlorthiazide 20-25 MG tablet Commonly known as: LOTENSIN HCT Take 1 tablet by mouth at bedtime.   CALCIUM PO Take 1 tablet by mouth daily.   carvedilol 25 MG tablet Commonly known as: COREG Take 1 tablet (25 mg total) by mouth 2 (two) times daily with a meal.   gabapentin 600 MG tablet Commonly  known as: NEURONTIN Take 0.5 tablets (300 mg total) by mouth at bedtime.   metFORMIN 500 MG tablet Commonly known as: GLUCOPHAGE Take 500 mg by mouth 2 (two) times daily.   pantoprazole 40 MG tablet Commonly known as: PROTONIX Take 1 tablet (40 mg total) by mouth daily.   rosuvastatin 10 MG tablet Commonly known as: CRESTOR Take 10 mg by mouth daily.   tamsulosin 0.4 MG Caps capsule Commonly known as: FLOMAX Take 1 capsule (0.4 mg total) by mouth daily.   VITAMIN B-12 PO Take 1 tablet by mouth daily.   Vitamin C 500 MG Caps Take 500-1,000 mg by mouth daily.      Follow-up Information    [facility] Follow up in 4 week(s).   Specialty: Neurology Why: after discharge from inpatient rehab. office will call with appt date and time.  Contact information: [address] [place] [phone]       Wenda Low, MD. Schedule an appointment as soon as possible for a visit in 2 week(s).  Specialty: Internal Medicine Contact information: [address], [address] [place] [address] [phone]          Allergies  Allergen Reactions  . Lyrica [Pregabalin] Nausea Only and Other (See Comments)    Hallucinations and dizziness, also  . Sulfa Antibiotics Other (See Comments)    Reaction not recalled     You were cared for by a hospitalist during your hospital stay. If you have any questions about your discharge medications or the care you received while you were in the hospital after you are discharged, you can call the unit and asked to speak with the hospitalist on call if the hospitalist that took care of you is not available. Once you are discharged, your primary care physician will handle any further medical issues. Please note that no refills for any discharge medications will be authorized once you are discharged, as it is imperative that you return to your primary care physician (or establish a  relationship with a primary care physician if you do not have one) for your aftercare needs so that they can reassess your need for medications and monitor your lab values.   Procedures/Studies: DG Abd 1 View  Result Date: 10/21/2019 CLINICAL DATA:  Ileus. EXAM: ABDOMEN - 1 VIEW COMPARISON:  10/19/2019 FINDINGS: The patient has evacuated the majority of the contrast from the colon that was present on the prior study. No dilated bowel. Small amount of contrast remains in the rectum. No acute bone abnormality. IMPRESSION: Benign appearing abdomen.  Resolution of the ileus. Electronically Signed   By: Lorriane Shire M.D.   On: 10/21/2019 09:46   DG Abd 1 View  Result Date: 10/18/2019 CLINICAL DATA:  Abdominal distention. EXAM: ABDOMEN - 1 VIEW COMPARISON:  10/16/2019 FINDINGS: Diffuse gaseous distention of small bowel and colon again. Noted cecum is more decompressed than on the prior study in there is gas visible in the left colon today. Thoracic aortic stent graft incompletely visualized. Patient is status post lower lumbar fusion. IMPRESSION: Mild gaseous distention of small bowel and colon. Imaging features are not frankly suggestive of an obstructive pattern and features may reflect ileus. Electronically Signed   By: Misty Stanley M.D.   On: 10/18/2019 11:01   DG Abd 1 View  Result Date: 10/16/2019 CLINICAL DATA:  Ileus. EXAM: ABDOMEN - 1 VIEW COMPARISON:  CT scan dated 10/13/2019 FINDINGS: There is slight gaseous distention of the ascending and transverse portions of the colon with air scattered throughout the small bowel and nondistended distal colon. There is air in a small bowel loop in the right inguinal hernia. No evidence of free air or free fluid on the supine radiograph. No acute bone abnormality. IMPRESSION: Slight gaseous distention of the proximal colon consistent with an ileus. Electronically Signed   By: Lorriane Shire M.D.   On: 10/16/2019 11:55   CT HEAD WO CONTRAST  Result Date:  10/17/2019 CLINICAL DATA:  Altered mental status with unclear cause EXAM: CT HEAD WITHOUT CONTRAST TECHNIQUE: Contiguous axial images were obtained from the base of the skull through the vertex without intravenous contrast. COMPARISON:  MRI of the brain from 2011 FINDINGS: Brain: Signs of atrophy and chronic microvascular ischemic change. No signs of hydrocephalus, midline shift, mass effect or intracranial hemorrhage. Subtle area of gray-white differentiation loss in the posterior right occipital lobe extending from the white matter to cortex, not associated with mass effect Subtle hypodensity also in the right cerebellum, inferiorly in the inferior cerebellar peduncle. Vascular: No  hyperdense vessel or unexpected calcification. Skull: Normal. Negative for fracture or focal lesion. Sinuses/Orbits: Visualized paranasal sinuses and orbits are unremarkable. Other: None IMPRESSION: Subtle area of gray-white differentiation loss in the posterior right occipital lobe extending from the white matter to cortex, not associated with mass effect. Subtle hypodensity also in the right cerebellum, inferiorly in the inferior cerebellar peduncle. Findings are suspicious for acute to subacute infarct, in particular the area in the right occipital lobe. Signs of generalized atrophy and chronic white matter disease. These results were called by telephone at the time of interpretation on 10/17/2019 at 12:17 pm to provider Dr. Loanne Drilling , who verbally acknowledged these results. Electronically Signed   By: Zetta Bills M.D.   On: 10/17/2019 12:18   MR ANGIO HEAD WO CONTRAST  Result Date: 10/18/2019 CLINICAL DATA:  Stroke follow-up. 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 EXAM: MRA HEAD WITHOUT CONTRAST TECHNIQUE: Angiographic images of the Circle of Willis were obtained using MRA technique without intravenous contrast. COMPARISON:  04/09/2010 FINDINGS: The study is mildly to moderately motion  degraded. The visualized distal vertebral arteries are patent to the basilar with the left being strongly dominant. The basilar artery is widely patent. Posterior communicating arteries are diminutive or absent. PCAs are patent without evidence of significant proximal stenosis on the right. A left P1 stenosis is again seen, moderate on the prior MRA and not felt to have progressed within limitations of motion artifact today. The internal carotid arteries are patent from skull base to carotid termini without evidence of significant stenosis allowing for artifact. ACAs and MCAs are patent without evidence of proximal branch occlusion or significant proximal stenosis. No aneurysm is identified. IMPRESSION: 1. Motion degraded examination without medium or large vessel occlusion or significant proximal anterior circulation stenosis. 2. Chronic moderate left P1 PCA stenosis. Electronically Signed   By: Logan Bores M.D.   On: 10/18/2019 17:19   MR BRAIN WO CONTRAST  Result Date: 10/17/2019 CLINICAL DATA:  Confusion. Recent endovascular repair of a thoracic aortic dissection. EXAM: MRI HEAD WITHOUT CONTRAST TECHNIQUE: Multiplanar, multiecho pulse sequences of the brain and surrounding structures were obtained without intravenous contrast. COMPARISON:  Head CT 10/17/2019 and MRI 04/09/2010 FINDINGS: The study is mildly motion degraded. Brain: Acute infarcts measure 2.5 cm in the superior right occipital lobe and 1 cm at the lateral left temporal-occipital junction. Additional subcentimeter acute infarcts are present in the right parietal and occipital lobes, left internal capsule and bilateral cerebellum. No intracranial hemorrhage, mass, midline shift, or extra-axial fluid collection is identified. T2 hyperintensities in the cerebral white matter bilaterally have mildly progressed from the prior MRI and are nonspecific but compatible with moderate chronic small vessel ischemic disease. Chronic infarcts in the left  centrum semiovale and corpus callosum are unchanged. There is moderate cerebral atrophy. Vascular: Major intracranial vascular flow voids are preserved. Skull and upper cervical spine: Unremarkable bone marrow signal. Sinuses/Orbits: Paranasal sinuses and mastoid air cells are clear. Unremarkable orbits. Other: None. IMPRESSION: 1. Small acute bilateral cerebral and cerebellar infarcts consistent with emboli. 2. Moderate chronic small vessel ischemic disease. Electronically Signed   By: Logan Bores M.D.   On: 10/17/2019 14:21   MR LUMBAR SPINE WO CONTRAST  Result Date: 10/22/2019 CLINICAL DATA:  Low back pain. Evaluate neurologic compression. EXAM: MRI LUMBAR SPINE WITHOUT CONTRAST TECHNIQUE: Multiplanar, multisequence MR imaging of the lumbar spine was performed. No intravenous contrast was administered. COMPARISON:  MRI of the lumbar spine March 06, 2018  and January 23, 2013 FINDINGS: Segmentation: A transitional lumbosacral vertebra is assumed to represent the L5 level. Careful correlation with this numbering strategy prior to any procedural intervention would be recommended. Alignment:  Physiologic. Vertebrae: Interbody fusion and posterior left transpedicular fixation is noted at L3-4 with laminectomy at L4-5. Degenerative endplate changes are noted at L3-4. Marrow signal characteristics are otherwise maintained. Conus medullaris and cauda equina: Conus extends to the T12-L1 level. Conus appear normal. Redundancy of nerve roots at the level of L1 is related to L2-3 spinal canal stenosis. Paraspinal and other soft tissues: Well-defined oval lesion located to the left of midline in the prevertebral soft tissue at the level of L5-S1 is seen in the sagittal views. The lesion as predominantly hypointense signal on T1 and heterogeneous signal on T2. When compared to prior study, the lesion appears with similar size (3.7 x 2.7 cm) when compared to study performed 2019 but enlarged when compared to MRI performed 2014.  T2 signal appear changed when compared to prior study. 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 Right renal cysts. Disc levels: T12-L1: No spinal canal or neural foraminal stenosis. L1-2: No spinal canal or neural foraminal stenosis. L2-3: Disc bulge with superimposed tiny right central disc protrusion, facet degenerative changes and ligamentum flavum redundancy resulting in moderate to severe spinal canal stenosis, moderate right and severe left neural foraminal stenosis. L3-4: Laminectomy with facet fusion. No spinal canal or neural foraminal stenosis. L4-5: Disc bulge with tiny superimposed central disc protrusion and mild facet degenerative changes result in severe bilateral neural foraminal narrowing. Laminectomy is also noted with no spinal canal stenosis. L5-S1: No spinal canal or neural foraminal stenosis. Compared to prior study, there has been significant progression of the degree of spinal canal stenosis at L2-3. IMPRESSION: 1. 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 This has progressed when compared to prior MRI. 2. Well-defined oval lesion the prevertebral soft tissues to the left of midline at the L5-S1 level, only seen on the sagittal images. This appear with similar size although slightly different T2 signal when compared to study performed 2019 but enlarged when compared to study performed in 2014. 3. 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 Recommend followup by ultrasound in 2 years. This recommendation follows ACR consensus guidelines: White Paper of the ACR Incidental Findings Committee II on Vascular Findings. J Am Coll Radiol 2013; 10: Electronically Signed   By: Pedro Earls M.D.   On: 10/22/2019 14:06   DG CHEST PORT 1 VIEW  Result Date: 10/20/2019 CLINICAL  DATA:  Shortness of breath. Recent aortic stent graft. EXAM: PORTABLE CHEST 1 VIEW COMPARISON:  10/15/2019 FINDINGS: The thoracic aortic stent graft appears stable. The right PICC line is stable. The right IJ central venous catheter has been removed. Persistent left lower lobe process, likely a combination of left pleural effusion and left lower lobe atelectasis. No findings for pulmonary edema. The right lung remains clear. IMPRESSION: Persistent left lower lobe process, likely a combination of effusion and atelectasis. Electronically Signed   By: Marijo Sanes M.D.   On: 10/20/2019 14:36   DG CHEST PORT 1 VIEW  Result Date: 10/15/2019 CLINICAL DATA:  Post central line insertion EXAM: PORTABLE CHEST 1 VIEW COMPARISON:  Portable exam 1235 hours compared to 10/08/2019 FINDINGS: New RIGHT jugular central venous catheter with tip projecting  over SVC. RIGHT arm PICC line tip projects over cavoatrial junction. Additional nonspecific tubing traverses the RIGHT hemithorax. Interval endoluminal stenting of a tortuous and mildly aneurysmal descending thoracic aorta. LEFT pleural effusion and basilar atelectasis. Upper lungs clear. IMPRESSION: No pneumothorax following RIGHT jugular line placement. Small to moderate LEFT pleural effusion and basilar atelectasis. Electronically Signed   By: Lavonia Dana M.D.   On: 10/15/2019 12:56   DG Chest Portable 1 View  Result Date: 10/08/2019 CLINICAL DATA:  Chest pain. EXAM: PORTABLE CHEST 1 VIEW COMPARISON:  December 10, 2014 FINDINGS: Cardiomegaly. Probable atelectasis in the left base. The heart, hila, mediastinum, lungs, and pleura are otherwise unchanged since December 10, 2014. IMPRESSION: No active disease. Electronically Signed   By: Dorise Bullion III M.D   On: 10/08/2019 15:44   DG Abd Portable 1V-Small Bowel Obstruction Protocol-initial, 8 hr delay  Result Date: 10/19/2019 CLINICAL DATA:  Follow up small bowel obstruction EXAM: PORTABLE ABDOMEN - 1 VIEW COMPARISON:   10/18/2019 FINDINGS: Scattered large and small bowel gas is noted. Recently administered contrast material now lies almost entirely within the colon. A minimal amount in the distal small bowel is seen. Postsurgical changes are noted in the descending thoracic aorta and lower lumbar spine. No free air is seen. IMPRESSION: Recently administered contrast material now lies almost entirely within the colon. No obstructive changes are seen. Electronically Signed   By: Inez Catalina M.D.   On: 10/19/2019 19:52   ECHOCARDIOGRAM COMPLETE  Result Date: 10/09/2019    ECHOCARDIOGRAM REPORT   Patient Name:   ZIGMONT FEINGOLD Takacs Date of Exam: 10/09/2019 Medical Rec #:  [id number]      Height:       71.0 in Accession #:    [id number]     Weight:       191.6 lb Date of Birth:  07-25-1946      BSA:          2.07 m Patient Age:    79 years       BP:           106/77 mmHg Patient Gender: M              HR:           56 bpm. Exam Location:  Inpatient Procedure: 2D Echo, Color Doppler and Cardiac Doppler Indications:    I26.02 Pulmonary embolus  History:        Patient has no prior history of Echocardiogram examinations.                 Risk Factors:Hypertension and Dyslipidemia. Known distal                 thoracic to abdominal aortic dissection.  Sonographer:    Raquel Sarna Senior RDCS Referring Phys: [place]  1. Left ventricular ejection fraction, by estimation, is 60 to 65%. The left ventricle has normal function. The left ventricle has no regional wall motion abnormalities. Left ventricular diastolic parameters are consistent with Grade I diastolic dysfunction (impaired relaxation).  2. Right ventricular systolic function is normal. The right ventricular size is normal. Tricuspid regurgitation signal is inadequate for assessing PA pressure.  3. The mitral valve is grossly normal. No evidence of mitral valve regurgitation. No evidence of mitral stenosis.  4. The aortic valve is tricuspid. Aortic valve regurgitation is  mild. No aortic stenosis is present.  5. Dissection flap present in descending aorta. Known type B dissection per medical record. Ascending  aortic aneurysm measured 46 mm on CTA. Aneurysm of the ascending aorta, measuring 45 mm.  6. The inferior vena cava is normal in size with greater than 50% respiratory variability, suggesting right atrial pressure of 3 mmHg. Comparison(s): No prior Echocardiogram. FINDINGS  Left Ventricle: Left ventricular ejection fraction, by estimation, is 60 to 65%. The left ventricle has normal function. The left ventricle has no regional wall motion abnormalities. The left ventricular internal cavity size was normal in size. There is  no left ventricular hypertrophy. Left ventricular diastolic parameters are consistent with Grade I diastolic dysfunction (impaired relaxation). Normal left ventricular filling pressure. Right Ventricle: The right ventricular size is normal. No increase in right ventricular wall thickness. Right ventricular systolic function is normal. Tricuspid regurgitation signal is inadequate for assessing PA pressure. Left Atrium: Left atrial size was normal in size. Right Atrium: Right atrial size was normal in size. Pericardium: Trivial pericardial effusion is present. Presence of pericardial fat pad. Mitral Valve: The mitral valve is grossly normal. No evidence of mitral valve regurgitation. No evidence of mitral valve stenosis. Tricuspid Valve: The tricuspid valve is grossly normal. Tricuspid valve regurgitation is trivial. Aortic Valve: The aortic valve is tricuspid. Aortic valve regurgitation is mild. No aortic stenosis is present. Pulmonic Valve: The pulmonic valve was grossly normal. Pulmonic valve regurgitation is trivial. Aorta: Dissection flap present in descending aorta. Known type B dissection per medical record. Ascending aortic aneurysm measured 46 mm on CTA. The aortic root is normal in size and structure. There is an aneurysm involving the ascending  aorta. The aneurysm measures 45 mm. Venous: The inferior vena cava is normal in size with greater than 50% respiratory variability, suggesting right atrial pressure of 3 mmHg. IAS/Shunts: No atrial level shunt detected by color flow Doppler.  LEFT VENTRICLE PLAX 2D LVIDd:         4.01 cm  Diastology LVIDs:         2.37 cm  LV e' lateral:   5.77 cm/s LV PW:         1.20 cm  LV E/e' lateral: 8.9 LV IVS:        1.20 cm  LV e' medial:    5.87 cm/s LVOT diam:     2.10 cm  LV E/e' medial:  8.8 LV SV:         65.46 ml LV SV Index:   24.24 LVOT Area:     3.46 cm  RIGHT VENTRICLE RV S prime:     13.30 cm/s TAPSE (M-mode): 1.9 cm LEFT ATRIUM             Index LA diam:        3.90 cm 1.88 cm/m LA Vol (A2C):   39.5 ml 19.08 ml/m LA Vol (A4C):   58.0 ml 28.01 ml/m LA Biplane Vol: 50.0 ml 24.15 ml/m  AORTIC VALVE LVOT Vmax:   117.00 cm/s LVOT Vmean:  67.800 cm/s LVOT VTI:    0.189 m  AORTA Ao Root diam:  3.90 cm Ao Sinus diam: 4.01 cm Ao STJ diam:   3.7 cm Ao Asc diam:   4.13 cm Ao Desc diam:  4.50 cm MITRAL VALVE MV Area (PHT): 2.34 cm    SHUNTS MV Decel Time: 324 msec    Systemic VTI:  0.19 m MV E velocity: 51.40 cm/s  Systemic Diam: 2.10 cm MV A velocity: 62.60 cm/s MV E/A ratio:  0.82 Eleonore Chiquito MD Electronically signed by Eleonore Chiquito MD Signature Date/Time: 10/09/2019/2:00:36 PM  Final    CT Angio Chest/Abd/Pel for Dissection W and/or W/WO  Result Date: 10/13/2019 CLINICAL DATA:  Back pain.  History of aortic dissection. EXAM: CT ANGIOGRAPHY CHEST, ABDOMEN AND PELVIS TECHNIQUE: Multidetector CT imaging through the chest, abdomen and pelvis was performed using the standard protocol during bolus administration of intravenous contrast. Multiplanar reconstructed images and MIPs were obtained and reviewed to evaluate the vascular anatomy. CONTRAST:  170mL OMNIPAQUE IOHEXOL 350 MG/ML SOLN COMPARISON:  October 08, 2019. FINDINGS: CTA CHEST FINDINGS Cardiovascular: Stable aneurysmal dilatation of ascending thoracic  aorta is noted at 4.3 cm. Stable aneurysmal dilatation of proximal descending thoracic aorta is noted at 4.4 cm. There is continued presence of dissection involving the distal descending thoracic aorta extending into the abdominal aorta. Aneurysmal dilatation of distal descending thoracic aorta is noted measured at 5.9 cm which is significantly enlarged compared to prior exam concerning for enlarged intramural hematoma. Mild cardiomegaly is noted. No pericardial effusion is noted. Great vessels are widely patent without significant stenosis. Mediastinum/Nodes: No enlarged mediastinal, hilar, or axillary lymph nodes. Thyroid gland, trachea, and esophagus demonstrate no significant findings. Lungs/Pleura: No pneumothorax is noted. Small left pleural effusion is noted. Mild left basilar subsegmental atelectasis is noted. Right lung is unremarkable. Musculoskeletal: No chest wall abnormality. No acute or significant osseous findings. Review of the MIP images confirms the above findings. CTA ABDOMEN AND PELVIS FINDINGS VASCULAR Aorta: Descending thoracic aortic dissection is seen extending into the infrarenal abdominal aorta. 4 cm infrarenal abdominal aortic aneurysm is noted which is slightly enlarged compared to prior exam. Celiac: Patent without evidence of aneurysm, vasculitis or significant stenosis. Arises from true lumen. SMA: Dissection flap extends into the proximal and middle portions of the superior mesenteric artery. More distal portions are patent. Renals: Left renal artery arises from true lumen and is widely patent without significant stenosis. Dissection flap appears to extend into proximal portion of right renal artery. No significant stenosis is noted. IMA: Patent without evidence of aneurysm, dissection, vasculitis or significant stenosis. Arises from false lumen. Inflow: Patent without evidence of aneurysm, dissection, vasculitis or significant stenosis. Veins: No obvious venous abnormality within the  limitations of this arterial phase study. Review of the MIP images confirms the above findings. NON-VASCULAR Hepatobiliary: No focal liver abnormality is seen. No gallstones, gallbladder wall thickening, or biliary dilatation. Pancreas: Unremarkable. No pancreatic ductal dilatation or surrounding inflammatory changes. Spleen: Normal in size without focal abnormality. Adrenals/Urinary Tract: Adrenal glands and kidneys are unremarkable. No hydronephrosis or renal obstruction is noted. Severely enlarged prostate is seen extending into inferior portion of urinary bladder. Stomach/Bowel: Stomach is within normal limits. Appendix appears normal. No evidence of bowel wall thickening, distention, or inflammatory changes. Lymphatic: No significant adenopathy is noted. Reproductive: As noted above, severely enlarged prostate gland is again noted which extends into inferior portion of urinary bladder. Other: Moderate size right inguinal hernia is noted which contains a loop of small bowel, but does not result in incarceration or obstruction. No ascites is noted. Musculoskeletal: No acute or significant osseous findings. Review of the MIP images confirms the above findings. IMPRESSION: There is continued presence of descending thoracic aortic dissection which extends as far distally as the infrarenal abdominal aorta. There is significantly increased aneurysmal dilatation of the distal descending thoracic aorta, now measuring 5.9 cm. This is concerning for an large intramural hematoma. Mild left pleural effusion is noted as well. These results will be called to the ordering clinician or representative by the Radiologist Assistant, and communication documented  in the PACS or zVision Dashboard. 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 Stable aneurysmal dilatation of proximal descending thoracic aorta is noted at 4.4 cm. Stable appearance of dissection flap extending into the proximal and middle portions of the  superior mesenteric artery. Dissection flap is seen extending into proximal portion of left renal artery. 4 cm infrarenal abdominal aortic aneurysm is noted which is slightly enlarged compared to prior exam. Moderate size right inguinal hernia is noted which contains a loop of small bowel, but does not result in obstruction. Stable severe prostatic enlargement with extension into inferior portion of urinary bladder. Electronically Signed   By: Marijo Conception M.D.   On: 10/13/2019 10:23   CT Angio Chest/Abd/Pel for Dissection W and/or Wo Contrast  Result Date: 10/08/2019 CLINICAL DATA:  74 year old male with thoracic aortic aneurysm presenting with chest and abdominal pain. Concern for dissection. EXAM: CT ANGIOGRAPHY CHEST, ABDOMEN AND PELVIS TECHNIQUE: Multidetector CT imaging through the chest, abdomen and pelvis was performed using the standard protocol during bolus administration of intravenous contrast. Multiplanar reconstructed images and MIPs were obtained and reviewed to evaluate the vascular anatomy. CONTRAST:  156mL OMNIPAQUE IOHEXOL 350 MG/ML SOLN COMPARISON:  Chest radiograph dated 10/08/2019 and CT abdomen pelvis dated 02/18/2005. FINDINGS: CTA CHEST FINDINGS Cardiovascular: There is mild cardiomegaly. No pericardial effusion. Coronary vascular calcification primarily involving the LAD and likely to a lesser degree RCA and left circumflex artery. There is aneurysmal dilatation of the thoracic aorta. The ascending aorta measures approximately 4.6 cm in diameter. The aortic isthmus measures approximately 4.6 cm in diameter. The descending thoracic aorta measures approximately 5 cm in diameter in the midportion and 4 cm distally. There is mild to moderate noncalcified plaque of the descending thoracic aorta. There is dissection of the distal descending thoracic aorta with the extension of the dissection flap into the abdominal aorta. There is a small amount of periaortic hematoma adjacent to the  distal descending thoracic aorta. There is no dissection of the ascending aorta or aortic arch. The origins of the great vessels of the aortic arch appear patent. Evaluation of the pulmonary arteries is limited due to suboptimal opacification and timing of the contrast. There is pulmonary artery embolus in the central right upper lobe pulmonary artery (series 6, image 35). A small pulmonary embolus is also likely present in the right lower lobe subsegmental branch (series 6, image 56). Mediastinum/Nodes: There is no hilar or mediastinal adenopathy. The esophagus is grossly unremarkable. No mediastinal fluid collection. Lungs/Pleura: There is subsegmental left lung base atelectasis. There is no focal consolidation, pleural effusion, or pneumothorax. The central airways are patent. Musculoskeletal: No chest wall abnormality. No acute or significant osseous findings. Review of the MIP images confirms the above findings. CTA ABDOMEN AND PELVIS FINDINGS VASCULAR Aorta: There is aneurysmal dilatation of the suprarenal abdominal aorta measuring up to approximately 4 cm (sagittal series 10, image 98). There is a partially thrombosed fusiform aneurysm of the distal infrarenal aorta measuring approximately 3.7 cm in diameter (sagittal series 10 image 105 and coronal series 9, image 72). There is a dissection flap contiguous with the thoracic aortic dissection and extends down into the distal infrarenal abdominal aorta approximately 2.5 cm above the aortic bifurcation. No significant periaortic hematoma identified. Celiac: There is apparent extension of the dissection along the right wall of the the celiac artery with narrowing of the lumen of the vessel by greater than 50%. There is also narrowing of the splenic and hepatic arteries likely  secondary to dissection and less likely due to noncalcified plaque. The major vascular branches of the celiac axis however remain patent. SMA: There is extension of the dissection flap into  the SMA. The SMA remains patent. Renals: There is extension of the dissection into the ostia of the renal arteries bilaterally. The renal arteries remain patent. IMA: The IMA is patent. Inflow: The common, internal and external iliac arteries are patent bilaterally. Veins: The IVC is grossly unremarkable.  No portal venous gas. Review of the MIP images confirms the above findings. NON-VASCULAR There is no intra-abdominal free air or free fluid. Hepatobiliary: There is diffuse fatty infiltration of the liver. No intrahepatic biliary ductal dilatation. No gallstone. Pancreas: Unremarkable. No pancreatic ductal dilatation or surrounding inflammatory changes. Spleen: Normal in size without focal abnormality. Adrenals/Urinary Tract: Left adrenal thickening. The right adrenal gland is unremarkable. There is no hydronephrosis on either side. The visualized ureters appear unremarkable. High attenuating content within the urinary bladder likely represents some excreted contrast. Blood product is less likely. Stomach/Bowel: Several small sigmoid diverticula without active inflammatory changes. There is no bowel obstruction or active inflammation. The appendix is normal. Lymphatic: A 3.1 x 2.6 cm ovoid soft tissue density posterior to the left common iliac vein and anterior to the sacrum (series 6, image 156) is suboptimally characterized but may represent an enlarged lymph node. Other etiologies include a neurogenic tumor. This can be better evaluated with MRI on a nonemergent/outpatient basis. Reproductive: Enlarged prostate gland with median lobe hypertrophy indenting and protruding into the base of the bladder. The prostate measures approximately 6.2 cm in transverse axial diameter. Other: None Musculoskeletal: Degenerative changes of the spine. Lower lumbar laminectomy and posterior fusion. No acute osseous pathology. Review of the MIP images confirms the above findings. IMPRESSION: 1. Aneurysmal dilatation of the  thoracic aorta as well as abdominal aorta as detailed above. 2. 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 There is a small amount of periaortic hematoma along the distal descending thoracic aorta. Vascular surgery consult is advised. 3. Small pulmonary embolus involving the central right upper lobe pulmonary artery as well as probable additional small subsegmental embolus in the right lower lobe. 4. Ovoid soft tissue density posterior to the left common iliac vein, indeterminate, possibly an enlarged lymph node or a neurogenic tumor. This can be better evaluated with MRI on a nonemergent basis. 5. Additional findings as above. These results were called by telephone at the time of interpretation on 10/08/2019 at 7:02 pm to provider MATTHEW TRIFAN , who verbally acknowledged these results. Electronically Signed   By: Anner Crete M.D.   On: 10/08/2019 19:29   VAS US CAROTID  Result Date: 10/18/2019 Carotid Arterial Duplex Study Indications:       Type B aortic dissection, S/P TEVAR. Risk Factors:      Hypertension, hyperlipidemia, past history of smoking. Comparison Study:  No prior study on file for comparison. Performing Technologist: Sharion Dove RVS  Examination Guidelines: A complete evaluation includes B-mode imaging, spectral Doppler, color Doppler, and power Doppler as needed of all accessible portions of each vessel. Bilateral testing is considered an integral part of a complete examination. Limited examinations for reoccurring indications may be performed as noted.  Right Carotid Findings: +----------+--------+--------+--------+------------------+------------------+           PSV cm/sEDV cm/sStenosisPlaque DescriptionComments           +----------+--------+--------+--------+------------------+------------------+ CCA Prox  143  19                                intimal thickening  +----------+--------+--------+--------+------------------+------------------+ CCA Distal88      20                                intimal thickening +----------+--------+--------+--------+------------------+------------------+ ICA Prox  68      20              heterogenous                         +----------+--------+--------+--------+------------------+------------------+ ICA Distal55      17                                                   +----------+--------+--------+--------+------------------+------------------+ ECA       140     20                                                   +----------+--------+--------+--------+------------------+------------------+ +----------+--------+-------+--------+-------------------+           PSV cm/sEDV cmsDescribeArm Pressure (mmHG) +----------+--------+-------+--------+-------------------+ [id number]                                         +----------+--------+-------+--------+-------------------+ +---------+--------+--+--------+-+ VertebralPSV cm/s35EDV cm/s9 +---------+--------+--+--------+-+  Left Carotid Findings: +----------+--------+--------+--------+------------------+------------------+           PSV cm/sEDV cm/sStenosisPlaque DescriptionComments           +----------+--------+--------+--------+------------------+------------------+ CCA Prox  104     22                                intimal thickening +----------+--------+--------+--------+------------------+------------------+ CCA Distal86      21                                intimal thickening +----------+--------+--------+--------+------------------+------------------+ ICA Prox  76      18              heterogenous                         +----------+--------+--------+--------+------------------+------------------+ ICA Distal79      19                                                    +----------+--------+--------+--------+------------------+------------------+ ECA       102     11                                                   +----------+--------+--------+--------+------------------+------------------+ +----------+--------+--------+--------+-------------------+  PSV cm/sEDV cm/sDescribeArm Pressure (mmHG) +----------+--------+--------+--------+-------------------+ [id number]                                          +----------+--------+--------+--------+-------------------+ +---------+--------+--+--------+--+ VertebralPSV cm/s47EDV cm/s12 +---------+--------+--+--------+--+   Summary: Right Carotid: Velocities in the right ICA are consistent with a 1-39% stenosis. Left Carotid: Velocities in the left ICA are consistent with a 1-39% stenosis. Vertebrals:  Bilateral vertebral arteries demonstrate antegrade flow. Subclavians: Normal flow hemodynamics were seen in bilateral subclavian              arteries. *See table(s) above for measurements and observations.  Electronically signed by Servando Snare MD on 10/18/2019 at 10:46:09 AM.    Final    VAS [place] LOWER EXTREMITY ARTERIAL DUPLEX  Result Date: 10/09/2019 LOWER EXTREMITY ARTERIAL DUPLEX STUDY Indications: Prominent popliteal artery pulses. Other Factors: Aortic dissection.  Current ABI: Not performed due to aortic dissection and possible politeal              aneurysm. Comparison Study: No prior exam. Performing Technologist: Baldwin Crown ARDMS, RVT  Examination Guidelines: A complete evaluation includes B-mode imaging, spectral Doppler, color Doppler, and power Doppler as needed of all accessible portions of each vessel. Bilateral testing is considered an integral part of a complete examination. Limited examinations for reoccurring indications may be performed as noted.  +----------+--------+-----+--------+---------+--------+ RIGHT     PSV cm/sRatioStenosisWaveform Comments  +----------+--------+-----+--------+---------+--------+ SFA Distal60                   triphasic         +----------+--------+-----+--------+---------+--------+ POP Prox  57                   triphasic         +----------+--------+-----+--------+---------+--------+ PTA Prox  82                   triphasic         +----------+--------+-----+--------+---------+--------+ PERO Prox 59                   triphasic         +----------+--------+-----+--------+---------+--------+  +----------+--------+-----+--------+---------+--------+ LEFT      PSV cm/sRatioStenosisWaveform Comments +----------+--------+-----+--------+---------+--------+ SFA Distal66                   triphasic         +----------+--------+-----+--------+---------+--------+ POP Prox  46                   triphasic         +----------+--------+-----+--------+---------+--------+ PTA Prox  140                  triphasic         +----------+--------+-----+--------+---------+--------+ PERO Prox 47                   triphasic         +----------+--------+-----+--------+---------+--------+  Summary: Right: Imaged areas are triphasic and no popliteal aneurysm visualized. Left: Imaged areas are triphasic and no popliteal aneurysm visualized.  See table(s) above for measurements and observations. Electronically signed by Deitra Mayo MD on 10/09/2019 at 5:00:29 PM.    Final    VAS [place] LOWER EXTREMITY VENOUS (DVT)  Result Date: 10/09/2019  Lower Venous DVTStudy Indications: Pulmonary embolism. Other Indications: Aortic dissection. Anticoagulation: Heparin. Comparison Study: No prior exam. Performing Technologist: Baldwin Crown  ARDMS, RVT  Examination Guidelines: A complete evaluation includes B-mode imaging, spectral Doppler, color Doppler, and power Doppler as needed of all accessible portions of each vessel. Bilateral testing is considered an integral part of a complete examination. Limited  examinations for reoccurring indications may be performed as noted. The reflux portion of the exam is performed with the patient in reverse Trendelenburg.  +---------+---------------+---------+-----------+----------+--------------+ RIGHT    CompressibilityPhasicitySpontaneityPropertiesThrombus Aging +---------+---------------+---------+-----------+----------+--------------+ CFV      Full           Yes      Yes                                 +---------+---------------+---------+-----------+----------+--------------+ SFJ      Full                                                        +---------+---------------+---------+-----------+----------+--------------+ FV Prox  Full                                                        +---------+---------------+---------+-----------+----------+--------------+ FV Mid   Full                                                        +---------+---------------+---------+-----------+----------+--------------+ FV DistalFull                                                        +---------+---------------+---------+-----------+----------+--------------+ PFV      Full                                                        +---------+---------------+---------+-----------+----------+--------------+ POP      Full           Yes      Yes                                 +---------+---------------+---------+-----------+----------+--------------+ PTV      Full                                                        +---------+---------------+---------+-----------+----------+--------------+ PERO     Full                                                        +---------+---------------+---------+-----------+----------+--------------+   +---------+---------------+---------+-----------+----------+--------------+  LEFT     CompressibilityPhasicitySpontaneityPropertiesThrombus Aging  +---------+---------------+---------+-----------+----------+--------------+ CFV      Full           Yes      Yes                                 +---------+---------------+---------+-----------+----------+--------------+ SFJ      Full                                                        +---------+---------------+---------+-----------+----------+--------------+ FV Prox  Full                                                        +---------+---------------+---------+-----------+----------+--------------+ FV Mid   Full                                                        +---------+---------------+---------+-----------+----------+--------------+ FV DistalFull                                                        +---------+---------------+---------+-----------+----------+--------------+ PFV      Full                                                        +---------+---------------+---------+-----------+----------+--------------+ POP      Full           Yes      Yes                                 +---------+---------------+---------+-----------+----------+--------------+ PTV      Full                                                        +---------+---------------+---------+-----------+----------+--------------+ PERO     Full                                                        +---------+---------------+---------+-----------+----------+--------------+     Summary: BILATERAL: - No evidence of deep vein thrombosis seen in the lower extremities, bilaterally.  RIGHT: - No cystic structure found in the popliteal fossa.  LEFT: - No cystic structure found in the popliteal fossa.  *See table(s) above for measurements  and observations. Electronically signed by Deitra Mayo MD on 10/09/2019 at 5:00:47 PM.    Final    [place] EKG SITE RITE  Result Date: 10/13/2019 If [facility] image not attached, placement could not be confirmed due to current cardiac  rhythm.  HYBRID OR IMAGING ([facility] ONLY)  Result Date: 10/15/2019 There is no interpretation for this exam.  This order is for images obtained during a surgical procedure.  Please See "Surgeries" Tab for more information regarding the procedure.      The results of significant diagnostics from this hospitalization (including imaging, microbiology, ancillary and laboratory) are listed below for reference.     Microbiology: Recent Results (from the past 240 hour(s))  Surgical PCR screen     Status: None   Collection Time: 10/15/19  6:28 AM   Specimen: Nasal Mucosa; Nasal Swab  Result Value Ref Range Status   MRSA, PCR NEGATIVE NEGATIVE Final   Staphylococcus aureus NEGATIVE NEGATIVE Final    Comment: (NOTE) The Xpert SA Assay (FDA approved for NASAL specimens in patients 41 years of age and older), is one component of a comprehensive surveillance program. It is not intended to diagnose infection nor to guide or monitor treatment. Performed at [facility], [place] [address], [place], [place] [address]      Labs: BNP (last 3 results) No results for input(s): BNP in the last 8760 hours. Basic Metabolic Panel: Recent Labs  Lab 10/18/19 0444 10/19/19 0541 10/20/19 0645 10/21/19 0339 10/22/19 0257  NA 136 133* 140 141 143  K 3.7 3.4* 3.3* 3.7 3.7  CL 107 103 112* 110 110  CO2 19* 19* 16* 22 22  GLUCOSE 114* 103* 94 139* 135*  BUN 15 15 15 14 16   CREATININE 1.38* 1.21 1.27* 1.10 1.11  CALCIUM 8.7* 8.4* 7.9* 8.2* 8.5*   Liver Function Tests: No results for input(s): AST, ALT, ALKPHOS, BILITOT, PROT, ALBUMIN in the last 168 hours. No results for input(s): LIPASE, AMYLASE in the last 168 hours. Recent Labs  Lab 10/17/19 1557  AMMONIA 14   CBC: Recent Labs  Lab 10/20/19 0645 10/21/19 0339 10/22/19 0257 10/23/19 0346 10/24/19 0500  WBC 9.2 9.9 9.4 9.2 9.5  HGB 8.8* 9.4* 9.2* 9.3* 9.4*  HCT 27.8* 29.0* 28.8* 29.5* 29.5*  MCV 91.7 90.6 91.1 91.9 91.0  PLT 237 228  268 318 369   Cardiac Enzymes: No results for input(s): CKTOTAL, CKMB, CKMBINDEX, TROPONINI in the last 168 hours. BNP: Invalid input(s): POCBNP CBG: Recent Labs  Lab 10/22/19 2054 10/23/19 0752 10/23/19 0801 10/23/19 1148 10/23/19 1342  GLUCAP 121* 110* 106* 152* 156*   D-Dimer No results for input(s): DDIMER in the last 72 hours. Hgb A1c No results for input(s): HGBA1C in the last 72 hours. Lipid Profile No results for input(s): CHOL, HDL, LDLCALC, TRIG, CHOLHDL, LDLDIRECT in the last 72 hours. Thyroid function studies No results for input(s): TSH, T4TOTAL, T3FREE, THYROIDAB in the last 72 hours.  Invalid input(s): FREET3 Anemia work up No results for input(s): VITAMINB12, FOLATE, FERRITIN, TIBC, IRON, RETICCTPCT in the last 72 hours. Urinalysis    Component Value Date/Time   COLORURINE AMBER (A) 12/10/2014 1103   APPEARANCEUR CLEAR 12/10/2014 1103   LABSPEC 1.022 12/10/2014 1103   PHURINE 5.5 12/10/2014 1103   GLUCOSEU NEGATIVE 12/10/2014 1103   HGBUR NEGATIVE 12/10/2014 1103   BILIRUBINUR NEGATIVE 12/10/2014 1103   KETONESUR NEGATIVE 12/10/2014 1103   PROTEINUR NEGATIVE 12/10/2014 1103   UROBILINOGEN 1.0 12/10/2014 1103  NITRITE NEGATIVE 12/10/2014 1103   LEUKOCYTESUR NEGATIVE 12/10/2014 1103   Sepsis Labs Invalid input(s): PROCALCITONIN,  WBC,  LACTICIDVEN Microbiology Recent Results (from the past 240 hour(s))  Surgical PCR screen     Status: None   Collection Time: 10/15/19  6:28 AM   Specimen: Nasal Mucosa; Nasal Swab  Result Value Ref Range Status   MRSA, PCR NEGATIVE NEGATIVE Final   Staphylococcus aureus NEGATIVE NEGATIVE Final    Comment: (NOTE) The Xpert SA Assay (FDA approved for NASAL specimens in patients 18 years of age and older), is one component of a comprehensive surveillance program. It is not intended to diagnose infection nor to guide or monitor treatment. Performed at [facility], [place] [address], [place], [place] [address]       Time coordinating discharge:  I have spent 35 minutes face to face with the patient and on the ward discussing the patients care, assessment, plan and disposition with other care givers. >50% of the time was devoted counseling the patient about the risks and benefits of treatment/Discharge disposition and coordinating care.   SIGNED:   Damita Lack, MD  [facility] 10/24/2019, 10:12 AM   If 7PM-7AM, please contact night-coverage

## 2019-10-25 ENCOUNTER — Inpatient Hospital Stay (HOSPITAL_COMMUNITY): Payer: Medicare Other

## 2019-10-25 ENCOUNTER — Inpatient Hospital Stay (HOSPITAL_COMMUNITY): Payer: Medicare Other | Admitting: Speech Pathology

## 2019-10-25 ENCOUNTER — Inpatient Hospital Stay (HOSPITAL_COMMUNITY): Payer: Medicare Other | Admitting: Occupational Therapy

## 2019-10-25 DIAGNOSIS — Z8673 Personal history of transient ischemic attack (TIA), and cerebral infarction without residual deficits: Secondary | ICD-10-CM

## 2019-10-25 LAB — CBC WITH DIFFERENTIAL/PLATELET
Abs Immature Granulocytes: 0.09 10*3/uL — ABNORMAL HIGH (ref 0.00–0.07)
Basophils Absolute: 0.1 10*3/uL (ref 0.0–0.1)
Basophils Relative: 1 %
Eosinophils Absolute: 0.1 10*3/uL (ref 0.0–0.5)
Eosinophils Relative: 1 %
HCT: 31.4 % — ABNORMAL LOW (ref 39.0–52.0)
Hemoglobin: 10.2 g/dL — ABNORMAL LOW (ref 13.0–17.0)
Immature Granulocytes: 1 %
Lymphocytes Relative: 9 %
Lymphs Abs: 0.9 10*3/uL (ref 0.7–4.0)
MCH: 29 pg (ref 26.0–34.0)
MCHC: 32.5 g/dL (ref 30.0–36.0)
MCV: 89.2 fL (ref 80.0–100.0)
Monocytes Absolute: 0.9 10*3/uL (ref 0.1–1.0)
Monocytes Relative: 9 %
Neutro Abs: 8.1 10*3/uL — ABNORMAL HIGH (ref 1.7–7.7)
Neutrophils Relative %: 79 %
Platelets: 444 10*3/uL — ABNORMAL HIGH (ref 150–400)
RBC: 3.52 MIL/uL — ABNORMAL LOW (ref 4.22–5.81)
RDW: 14 % (ref 11.5–15.5)
WBC: 10.2 10*3/uL (ref 4.0–10.5)
nRBC: 0 % (ref 0.0–0.2)

## 2019-10-25 LAB — BASIC METABOLIC PANEL
Anion gap: 10 (ref 5–15)
BUN: 12 mg/dL (ref 8–23)
CO2: 22 mmol/L (ref 22–32)
Calcium: 8.7 mg/dL — ABNORMAL LOW (ref 8.9–10.3)
Chloride: 106 mmol/L (ref 98–111)
Creatinine, Ser: 1.04 mg/dL (ref 0.61–1.24)
GFR calc Af Amer: >60 mL/min (ref >60)
GFR calc non Af Amer: >60 mL/min (ref >60)
Glucose, Bld: 177 mg/dL — ABNORMAL HIGH (ref 70–99)
Potassium: 4 mmol/L (ref 3.5–5.1)
Sodium: 138 mmol/L (ref 135–145)

## 2019-10-25 LAB — GLUCOSE, CAPILLARY
Glucose-Capillary: 114 mg/dL — ABNORMAL HIGH (ref 70–99)
Glucose-Capillary: 132 mg/dL — ABNORMAL HIGH (ref 70–99)
Glucose-Capillary: 113 mg/dL — ABNORMAL HIGH (ref 70–99)
Glucose-Capillary: 127 mg/dL — ABNORMAL HIGH (ref 70–99)

## 2019-10-25 MED ORDER — CHLORHEXIDINE GLUCONATE CLOTH 2 % EX PADS: 6.0000 | MEDICATED_PAD | Freq: Every day | CUTANEOUS | Status: DC

## 2019-10-25 MED ORDER — SODIUM CHLORIDE 0.9% FLUSH
10.0000 mL | Freq: Two times a day (BID) | INTRAVENOUS | Status: DC
  Administered 2019-10-25 – 2019-10-30 (×7): 10 mL

## 2019-10-25 MED ORDER — FUROSEMIDE 20 MG PO TABS
20.0000 mg | ORAL_TABLET | Freq: Once | ORAL | Status: AC
  Administered 2019-10-25: 20 mg via ORAL
  Filled 2019-10-25: qty 1

## 2019-10-25 MED ORDER — DOCUSATE SODIUM 283 MG RE ENEM
1.0000 | ENEMA | Freq: Once | RECTAL | Status: DC
  Filled 2019-10-25 (×2): qty 1

## 2019-10-25 MED ORDER — SODIUM CHLORIDE 0.9% FLUSH: 10.0000 mL | INTRAVENOUS | Status: DC | PRN

## 2019-10-25 NOTE — Evaluation (Signed)
Speech Language Pathology Assessment and Plan  Patient Details  Name: [name] [name] MRN: [id number] Date of Birth: [birth year]  SLP Diagnosis: Cognitive Impairments  Rehab Potential: Good ELOS: 10-14 days    Today's Date: 10/25/2019 SLP Individual Time: 1305-1400 SLP Individual Time Calculation (min): 55 min   Problem List:  Patient Active Problem List   Diagnosis Date Noted  . 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 History of repair of dissecting thoracic aneurysm   . Abdominal distension   . Pulmonary embolus ([place]) 10/19/2019  . CVA (cerebral vascular accident) ([place]) 10/19/2019  . AKI (acute kidney injury) ([place]) 10/19/2019  . 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 Ileus ([place]) 10/19/2019  . 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 Radiculopathy 12/15/2014   Past Medical History:  Past Medical History:  Diagnosis Date  . Chronic back pain    stenosis  . Enlarged prostate   . GERD (gastroesophageal reflux disease)    occasionally will take a zantac(maybe once a month)  . History of colon polyps   . History of kidney stones   . History of stress test    done 10 yrs. ago, as a baseline   . Hyperlipidemia    takes Crestor daily  . Hypertension    takes Amlodipine daily and Lotensin as well   Past Surgical History:  Past Surgical History:  Procedure Laterality Date  . ANTERIOR LAT LUMBAR FUSION Left 12/15/2014   Procedure: ANTERIOR LATERAL LUMBAR FUSION 1 LEVEL;  Surgeon: Phylliss Bob, MD;  Location: [place];  Service: Orthopedics;  Laterality: Left;  Left sided lateral lumbar interbody fusion, lumbar 3-4, posterior spinal fusion, lumbar 3-4 with instrumentation.  . COLONOSCOPY    . ESOPHAGOGASTRODUODENOSCOPY    . fatty tissue removed from stomach    . LUMBAR LAMINECTOMY/DECOMPRESSION  MICRODISCECTOMY N/A 07/08/2013   Procedure: LUMBAR LAMINECTOMY/DECOMPRESSION MICRODISCECTOMY;  Surgeon: Sinclair Ship, MD;  Location: [place] [facility];  Service: Orthopedics;  Laterality: N/A;  Lumbar 3-4, lumbar 4-5 decompression  . RADIOLOGY WITH ANESTHESIA N/A 10/22/2019   Procedure: MRI WITH ANESTHESIA OF LUMBAR SPINE WITHOUT CONTRAST;  Surgeon: Radiologist, Medication, MD;  Location: [place];  Service: Radiology;  Laterality: N/A;  . right ankle surgery     as child   . THORACIC AORTIC ENDOVASCULAR STENT GRAFT N/A 10/15/2019   Procedure: THORACIC AORTIC ENDOVASCULAR STENT GRAFT;  Surgeon: Waynetta Sandy, MD;  Location: [facility];  Service: Vascular;  Laterality: N/A;  . TONSILLECTOMY     as a child    Assessment / Plan / Recommendation Clinical Impression   [name]. [name] is a 74 year old right-handed male with history of chronic back pain with lumbar discectomy 2014 as well as anterior lateral lumbar fusion 2016, BPH, hypertension, diabetes mellitus, hyperlipidemia and remote tobacco abuse.  Per chart review lives with spouse independent prior to admission.  Two-level home bed and bath on main level.  Presented 10/08/2019 with lower chest and back pain with epigastric discomfort times several weeks as well as bouts of nausea and vomiting.  In the ED blood pressure 180s/130s.  EKG showed nonspecific ST-T changes not significantly different from prior EKG 2016.  First troponin HS 28, potassium 3.3, creatinine 1.25, WBC 14,100, hemoglobin 14.5, SARS coronavirus negative.  Chest x-ray no active disease.  CT angiogram of chest abdomen pelvis showed long segment aortic dissection starting in the  distal descending thoracic aorta and extending to the distal infrarenal abdominal aorta.  Small pulmonary embolus involving the central right lobe pulmonary artery as well as probable additional small subsegmental embolus in the right lower lobe.  Echocardiogram with ejection fraction 65%.  Underwent repair of  acute type B aortic dissection 10/15/2019 per vascular surgery Dr. Donzetta Matters.  Placed on intravenous heparin therapy.  Neurology consulted 10/17/2019 for altered mental status.  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  Carotid Dopplers with no ICA stenosis.  Neurology follow-up and heparin was transitioned to Eliquis.  Hospital course blood loss anemia 9.3 and monitored with follow-up per GI services there was some fear of possible GI bleed related to intravenous heparin as he was transition to Eliquis no other overt bleeding noted..  Tolerating a regular consistency diet.  Patient was admitted for a comprehensive rehab program.  SLP evaluation was completed on 10/25/2019 with the following results:  Pt presents with moderate cognitive deficits characterized by decreased recall of daily information, decreased functional problem solving, decreased emergent awareness of deficits, and decreased sustained attention to tasks.  Additionally, pt presents with increased response latency and his verbal output is at times vague and circumlocutory although I feel that this is secondary to slowed processing and cognitive deficits versus primary language impairment as he his expressive and receptive language were judged to be [facility] on assessment.  As a result, currently pt requires mod assist to complete mildly complex tasks Given the abovementioned deficits, pt would benefit from skilled ST while inpatient in order to maximize functional independence and reduce burden of care prior to discharge.  Anticipate that pt will need 24/7 supervision at discharge in addition to [place] follow up at next level of care.     Skilled Therapeutic Interventions          Cognitive-linguistic evaluation completed with results and recommendations reviewed with patient and family.     SLP Assessment  Patient will need skilled [facility] during CIR admission     Recommendations  Recommendations for Other Services: Neuropsych consult Patient destination: Home Follow up Recommendations: Home Health SLP;24 hour supervision/assistance Equipment Recommended: None recommended by SLP    SLP Frequency 3 to 5 out of 7 days   SLP Duration  SLP Intensity  SLP Treatment/Interventions 10-14 days  Minumum of 1-2 x/day, 30 to 90 minutes  Cognitive remediation/compensation;Cueing hierarchy;Functional tasks;Internal/external aids;Patient/family education    Pain Pain Assessment Pain Scale: 0-10 Pain Score: 0-No pain  Prior Functioning Cognitive/Linguistic Baseline: Within functional limits Type of Home: House  Lives With: Spouse Available Help at Discharge: Family;Available 24 hours/day Vocation: Retired  Programmer, systems Overall Cognitive Status: Impaired/Different from baseline Arousal/Alertness: Awake/alert Orientation Level: Oriented X4 Attention: Sustained Sustained Attention: Impaired Sustained Attention Impairment: Functional basic;Verbal basic Memory: Impaired Memory Impairment: Retrieval deficit;Decreased recall of new information Awareness: Impaired Awareness Impairment: Emergent impairment Problem Solving: Impaired Problem Solving Impairment: Functional basic Executive Function: Initiating Initiating: Impaired Initiating Impairment: Verbal basic;Functional basic Safety/Judgment: Appears intact Comments: delayed processing  Comprehension Auditory Comprehension Overall Auditory Comprehension: Appears within functional limits for tasks assessed Expression Expression Primary Mode of Expression: Verbal Verbal Expression Overall Verbal Expression: Appears within functional limits for tasks assessed Oral Motor Oral Motor/Sensory Function Overall Oral Motor/Sensory Function: Within functional limits Motor Speech Overall Motor Speech: Appears within functional limits for tasks assessed      Short Term Goals: Week  1: SLP Short Term Goal 1 (Week 1): Pt will  utilize external aids to recall mildly complex daily information with min assist verbal cues. SLP Short Term Goal 2 (Week 1): Pt will complete mildly complex functional tasks with min assist for functional problem solving. SLP Short Term Goal 3 (Week 1): Pt will recognize and correct errors during mildly complex functional tasks with min assist verbal cues. SLP Short Term Goal 4 (Week 1): Pt will sustain his attention to functional tasks for 10 minute intervals with min verbal cues for redirection.  Refer to Care Plan for Long Term Goals  Recommendations for other services: None   Discharge Criteria: Patient will be discharged from SLP if patient refuses treatment 3 consecutive times without medical reason, if treatment goals not met, if there is a change in medical status, if patient makes no progress towards goals or if patient is discharged from hospital.  The above assessment, treatment plan, treatment alternatives and goals were discussed and mutually agreed upon: by patient  Emilio Math 10/25/2019, 4:00 PM

## 2019-10-25 NOTE — Discharge Instructions (Addendum)
Inpatient Rehab Discharge Instructions  Vinn Zaner Knechtel Discharge date and time: No discharge date for patient encounter.   Activities/Precautions/ Functional Status: Activity: activity as tolerated Diet: regular diet Wound Care: none needed Functional status:  ___ No restrictions     ___ Walk up steps independently ___ 24/7 supervision/assistance   ___ Walk up steps with assistance ___ Intermittent supervision/assistance  ___ Bathe/dress independently ___ Walk with walker     _x__ Bathe/dress with assistance ___ Walk Independently    ___ Shower independently ___ Walk with assistance    ___ Shower with assistance ___ No alcohol     ___ Return to work/school ________  COMMUNITY REFERRALS UPON DISCHARGE:  Home Health: PT, OT, ST  Agency:[facility] Phone: [phone]   Medical Equipment/Items Ordered:W/C, TTB, Drop arm commode  Agency/Supplier:[facility]  Special Instructions: No driving smoking or alcohol  Glucophage currently on hold with blood sugars acceptable.  Follow-up with PCP on resuming diabetic medications as needed  STROKE/TIA DISCHARGE INSTRUCTIONS SMOKING Cigarette smoking nearly doubles your risk of having a stroke & is the single most alterable risk factor  If you smoke or have smoked in the last 12 months, you are advised to quit smoking for your health.  Most of the excess cardiovascular risk related to smoking disappears within a year of stopping.  Ask you doctor about anti-smoking medications  [place] Quit Line: 1-800-QUIT NOW  Free Smoking Cessation Classes [phone]  CHOLESTEROL Know your levels; limit fat & cholesterol in your diet  Lipid Panel     Component Value Date/Time   CHOL 136 10/09/2019 1859   TRIG 121 10/09/2019 1859   HDL 40 (L) 10/09/2019 1859   CHOLHDL 3.4 10/09/2019 1859   VLDL 24 10/09/2019 1859   LDLCALC 72 10/09/2019 1859      Many patients benefit from treatment even if their cholesterol is at goal.  Goal: Total  Cholesterol (CHOL) less than 160  Goal:  Triglycerides (TRIG) less than 150  Goal:  HDL greater than 40  Goal:  LDL (LDLCALC) less than 100   BLOOD PRESSURE American Stroke Association blood pressure target is less that 120/80 mm/Hg  Your discharge blood pressure is:  BP: (!) 160/78  Monitor your blood pressure  Limit your salt and alcohol intake  Many individuals will require more than one medication for high blood pressure  DIABETES (A1c is a blood sugar average for last 3 months) Goal HGBA1c is under 7% (HBGA1c is blood sugar average for last 3 months)  Diabetes:     Lab Results  Component Value Date   HGBA1C 6.7 (H) 10/09/2019     Your HGBA1c can be lowered with medications, healthy diet, and exercise.  Check your blood sugar as directed by your physician  Call your physician if you experience unexplained or low blood sugars.  PHYSICAL ACTIVITY/REHABILITATION Goal is 30 minutes at least 4 days per week  Activity: Increase activity slowly, Therapies: Physical Therapy: Home Health Return to work:   Activity decreases your risk of heart attack and stroke and makes your heart stronger.  It helps control your weight and blood pressure; helps you relax and can improve your mood.  Participate in a regular exercise program.  Talk with your doctor about the best form of exercise for you (dancing, walking, swimming, cycling).  DIET/WEIGHT Goal is to maintain a healthy weight  Your discharge diet is:  Diet Order            Diet Heart Room  service appropriate? Yes; Fluid consistency: Thin  Diet effective now              liquids Your height is:  Height: 5\' 10"  (177.8 cm) Your current weight is: Weight: 85.1 kg Your Body Mass Index (BMI) is:  BMI (Calculated): 26.92  Following the type of diet specifically designed for you will help prevent another stroke.  Your goal weight range is:    Your goal Body Mass Index (BMI) is 19-24.  Healthy food habits can help reduce 3  risk factors for stroke:  High cholesterol, hypertension, and excess weight.  RESOURCES Stroke/Support Group:  Call [phone]   STROKE EDUCATION PROVIDED/REVIEWED AND GIVEN TO PATIENT Stroke warning signs and symptoms How to activate emergency medical system (call 911). Medications prescribed at discharge. Need for follow-up after discharge. Personal risk factors for stroke. Pneumonia vaccine given:  Flu vaccine given:  My questions have been answered, the writing is legible, and I understand these instructions.  I will adhere to these goals & educational materials that have been provided to me after my discharge from the hospital.      My questions have been answered and I understand these instructions. I will adhere to these goals and the provided educational materials after my discharge from the hospital.  Patient/Caregiver Signature _______________________________ Date __________  Clinician Signature _______________________________________ Date __________  Please bring this form and your medication list with you to all your follow-up doctor's appointments. Information on my medicine - ELIQUIS (apixaban)  This medication education was reviewed with me or my healthcare representative as part of my discharge preparation.    =================================================== Information on my medicine - ELIQUIS (apixaban)  This medication education was reviewed with me or my healthcare representative as part of my discharge preparation.    Why was Eliquis prescribed for you? Eliquis was prescribed to treat blood clots that may have been found in the veins of your legs (deep vein thrombosis) or in your lungs (pulmonary embolism) and to reduce the risk of them occurring again.  What do You need to know about Eliquis ? The  dose is ONE 5 mg tablet taken TWICE daily.  Eliquis may be taken with or without food.   Try to take the dose about the same time in the morning and in  the evening. If you have difficulty swallowing the tablet whole please discuss with your pharmacist how to take the medication safely.  Take Eliquis exactly as prescribed and DO NOT stop taking Eliquis without talking to the doctor who prescribed the medication.  Stopping may increase your risk of developing a new blood clot.  Refill your prescription before you run out.  After discharge, you should have regular check-up appointments with your healthcare provider that is prescribing your Eliquis.    What do you do if you miss a dose? If a dose of ELIQUIS is not taken at the scheduled time, take it as soon as possible on the same day and twice-daily administration should be resumed. The dose should not be doubled to make up for a missed dose.  Important Safety Information A possible side effect of Eliquis is bleeding. You should call your healthcare provider right away if you experience any of the following: ? Bleeding from an injury or your nose that does not stop. ? Unusual colored urine (red or dark brown) or unusual colored stools (red or black). ? Unusual bruising for unknown reasons. ? A serious fall or if you hit your head (even  if there is no bleeding).  Some medicines may interact with Eliquis and might increase your risk of bleeding or clotting while on Eliquis. To help avoid this, consult your healthcare provider or pharmacist prior to using any new prescription or non-prescription medications, including herbals, vitamins, non-steroidal anti-inflammatory drugs (NSAIDs) and supplements.  This website has more information on Eliquis (apixaban): [email or web address]://[email or web address].[email or web address]

## 2019-10-25 NOTE — Evaluation (Signed)
Occupational Therapy Assessment and Plan  Patient Details  Name: [name] [name] MRN: [id number] Date of Birth: [birth year]  OT Diagnosis: abnormal posture, apraxia, disturbance of vision and muscle weakness (generalized) Rehab Potential: Rehab Potential (ACUTE ONLY): Good ELOS: 10-12 days   Today's Date: 10/25/2019 OT Individual Time: 7867-6720 OT Individual Time Calculation (min): 60 min     Problem List:  Patient Active Problem List   Diagnosis Date Noted  . 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 History of repair of dissecting thoracic aneurysm   . Abdominal distension   . Pulmonary embolus ([place]) 10/19/2019  . CVA (cerebral vascular accident) ([place]) 10/19/2019  . AKI (acute kidney injury) ([place]) 10/19/2019  . 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 Ileus ([place]) 10/19/2019  . 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 Radiculopathy 12/15/2014    Past Medical History:  Past Medical History:  Diagnosis Date  . Chronic back pain    stenosis  . Enlarged prostate   . GERD (gastroesophageal reflux disease)    occasionally will take a zantac(maybe once a month)  . History of colon polyps   . History of kidney stones   . History of stress test    done 10 yrs. ago, as a baseline   . Hyperlipidemia    takes Crestor daily  . Hypertension    takes Amlodipine daily and Lotensin as well   Past Surgical History:  Past Surgical History:  Procedure Laterality Date  . ANTERIOR LAT LUMBAR FUSION Left 12/15/2014   Procedure: ANTERIOR LATERAL LUMBAR FUSION 1 LEVEL;  Surgeon: Phylliss Bob, MD;  Location: [place];  Service: Orthopedics;  Laterality: Left;  Left sided lateral lumbar interbody fusion, lumbar 3-4, posterior spinal fusion, lumbar 3-4 with instrumentation.  . COLONOSCOPY    . ESOPHAGOGASTRODUODENOSCOPY    .  fatty tissue removed from stomach    . LUMBAR LAMINECTOMY/DECOMPRESSION MICRODISCECTOMY N/A 07/08/2013   Procedure: LUMBAR LAMINECTOMY/DECOMPRESSION MICRODISCECTOMY;  Surgeon: Sinclair Ship, MD;  Location: [facility] [place];  Service: Orthopedics;  Laterality: N/A;  Lumbar 3-4, lumbar 4-5 decompression  . RADIOLOGY WITH ANESTHESIA N/A 10/22/2019   Procedure: MRI WITH ANESTHESIA OF LUMBAR SPINE WITHOUT CONTRAST;  Surgeon: Radiologist, Medication, MD;  Location: [place];  Service: Radiology;  Laterality: N/A;  . right ankle surgery     as child   . THORACIC AORTIC ENDOVASCULAR STENT GRAFT N/A 10/15/2019   Procedure: THORACIC AORTIC ENDOVASCULAR STENT GRAFT;  Surgeon: Waynetta Sandy, MD;  Location: [place];  Service: Vascular;  Laterality: N/A;  . TONSILLECTOMY     as a child    Assessment & Plan Clinical Impression: [name]. [name] [name] is a 74 year old right-handed male with history of chronic back pain with lumbar discectomy 2014 as well as anterior lateral lumbar fusion 2016, BPH, hypertension, diabetes mellitus, hyperlipidemia and remote tobacco abuse. Per chart review lives with spouse independent prior to admission. Two-level home bed and bath on main level. Presented 10/08/2019 with lower chest and back pain with epigastric discomfort times several weeks as well as bouts of nausea and vomiting. In the ED blood pressure 180s/130s. EKG showed nonspecific ST-T changes not significantly different from prior EKG 2016. First troponin HS 28, potassium 3.3, creatinine 1.25, WBC 14,100, hemoglobin 14.5, SARS coronavirus negative. Chest x-ray no active disease. CT angiogram of chest abdomen pelvis showed long segment aortic dissection starting in the  distal descending thoracic aorta and extending to the distal infrarenal abdominal aorta. Small pulmonary embolus involving the central right lobe pulmonary artery as well as probable additional small subsegmental embolus in the right lower lobe. Echocardiogram with  ejection fraction 65%. 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 Donzetta Matters. Placed on intravenous heparin therapy. Neurology consulted 10/17/2019 for altered mental status. 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 Carotid Dopplers with no ICA stenosis. Neurology follow-up and heparin was transitioned to Eliquis. Hospital course blood loss anemia 9.3 and monitored with follow-up per GI services there was some fear of possible GI bleed related to intravenous heparin as he was transition to Eliquis no other overt bleeding noted.. Tolerating a regular consistency diet. Patient was admitted for a comprehensive rehab program.    Patient transferred to CIR on 10/24/2019 .    Patient currently requires min A with UB and  max with LB basic self-care skills secondary to muscle weakness, decreased cardiorespiratoy endurance, motor apraxia, decreased visual motor skills, decreased initiation, decreased attention, decreased awareness, decreased problem solving, decreased memory and delayed processing, central origin and decreased sitting balance, decreased standing balance, decreased postural control and decreased balance strategies.  Prior to hospitalization, patient was fully independent and driving.  Patient will benefit from skilled intervention to increase independence with basic self-care skills prior to discharge home with care partner.  Anticipate patient will require 24 hour supervision and follow up home health.  OT - End of Session Activity Tolerance: Tolerates 10 - 20 min activity with multiple rests Endurance Deficit: Yes Endurance Deficit Description: shortness of breath with talking or exerting effort in movement OT Assessment Rehab Potential (ACUTE ONLY): Good OT Patient demonstrates impairments in the following area(s): Balance;Cognition;Endurance;Motor;Perception;Safety;Vision OT Basic ADL's Functional  Problem(s): Eating;Grooming;Bathing;Dressing;Toileting OT Transfers Functional Problem(s): Toilet;Tub/Shower OT Additional Impairment(s): None OT Plan OT Intensity: Minimum of 1-2 x/day, 45 to 90 minutes OT Frequency: 5 out of 7 days OT Duration/Estimated Length of Stay: 10-14 days OT Treatment/Interventions: Balance/vestibular training;Cognitive remediation/compensation;Discharge planning;Neuromuscular re-education;Functional mobility training;DME/adaptive equipment instruction;Patient/family education;Psychosocial support;Self Care/advanced ADL retraining;Therapeutic Activities;Therapeutic Exercise;UE/LE Strength taining/ROM;UE/LE Coordination activities;Visual/perceptual remediation/compensation OT Self Feeding Anticipated Outcome(s): set up OT Basic Self-Care Anticipated Outcome(s): Supervision OT Toileting Anticipated Outcome(s): Supervision OT Bathroom Transfers Anticipated Outcome(s): Supervision OT Recommendation Recommendations for Other Services: Vestibular eval Patient destination: Home Follow Up Recommendations: Home health OT Equipment Recommended: Tub/shower bench   Skilled Therapeutic Intervention Pt seen for initial evaluation and ADL training with a focus on functional mobility.  Pt verbal but had difficulty expressing himself in part to sounding out of breath when he spoke.  O2 sats 100%. Pt needed extra to sit to EOB with min A but then could not hold his balance.  Overall, delayed reaction time, delayed processing, cognition fluctuated throughout the session.  He was able to answer questions but had difficulty "understanding what was happening".   Sit to stand with mod A with posterior lean. Stand pivot to w/c with mod A.  At sink with standing pt has a flexed posture and leans to L.  Frequently c/o dizziness, blood pressure WNL. Discussed his diplopia and oculomotor weakness of L eye.   Overall, good participation and pt is motivated. Reviewed purpose of OT, OT goals and  estimated LOS.  Pt resting in wc with belt alarm on and all needs met.      OT Evaluation Precautions/Restrictions  Precautions Precautions: Fall Restrictions Weight Bearing Restrictions: No  Vital Signs Therapy Vitals Pulse Rate: 72 BP: 133/77 Patient Position (if appropriate): Sitting Oxygen Therapy SpO2: 100 % O2 Device: Room Air Pain Pain Assessment Pain Score: 0-No pain Home Living/Prior Functioning Home Living Family/patient expects to be discharged to:: Private residence Living Arrangements: Spouse/significant other Available Help at Discharge: Family, Available 24 hours/day Type of Home: House Home Layout: Two level, Able to live on main level with bedroom/bathroom Alternate Level Stairs-Number of Steps: 7 x2 separated by landing Alternate Level Stairs-Rails: Right Bathroom Shower/Tub: Tub/shower unit, Multimedia programmer: Standard  Lives With: Spouse Prior Function Level of Independence: Independent with basic ADLs, Independent with gait, Independent with transfers  Able to Take Stairs?: Yes Driving: Yes Vocation: Retired Comments: pt likes to Tax adviser, sing and be with his grandchildren and great grandchildren ADL ADL Eating: Set up Grooming: Setup Where Assessed-Grooming: Sitting at sink Upper Body Bathing: Setup Where Assessed-Upper Body Bathing: Sitting at sink Lower Body Bathing: Maximal assistance Where Assessed-Lower Body Bathing: Sitting at sink, Standing at sink Upper Body Dressing: Minimal assistance Where Assessed-Upper Body Dressing: Sitting at sink Lower Body Dressing: Maximal assistance Where Assessed-Lower Body Dressing: Sitting at sink, Standing at sink Vision Baseline Vision/History: Wears glasses Wears Glasses: At all times Patient Visual Report: Diplopia Vision Assessment?: Yes Eye Alignment: Impaired (comment)(L eye diverted laterally) Ocular Range of Motion: Within Functional Limits Alignment/Gaze  Preference: Within Defined Limits Tracking/Visual Pursuits: Able to track stimulus in all quads without difficulty Saccades: Within functional limits Convergence: Impaired (comment)(L eye does not converge) Visual Fields: No apparent deficits Diplopia Assessment: Disappears with one eye closed(disappears with R eye closed but not L) Depth Perception: (WFL) Perception  Perception: Within Functional Limits Praxis Praxis: Impaired Praxis Impairment Details: Initiation;Motor planning Praxis-Other Comments: mod impaired Cognition Overall Cognitive Status: Impaired/Different from baseline Arousal/Alertness: Awake/alert Orientation Level: [place];Place;Situation [place]: Oriented Place: Oriented Situation: Oriented Year: 2021 Month: February Day of Week: Incorrect("not sure") Memory: Appears intact Immediate Memory Recall: Sock;Blue;Bed Memory Recall Sock: Without Cue Memory Recall Blue: Without Cue Memory Recall Bed: With Cue Attention: Sustained Sustained Attention: Impaired Awareness: Impaired Awareness Impairment: Emergent impairment Problem Solving: Impaired Problem Solving Impairment: Functional basic Executive Function: Initiating Initiating: Impaired Safety/Judgment: Appears intact Comments: delayed processing Sensation Sensation Light Touch: Appears Intact Hot/Cold: Appears Intact Proprioception: Appears Intact Stereognosis: Appears Intact Coordination Gross Motor Movements are Fluid and Coordinated: No Fine Motor Movements are Fluid and Coordinated: No Coordination and Movement Description: slow movement patterns Finger Nose Finger Test: slow and delayed - 5x in 10 sec on B hands, no dysmmetria noted Motor  Motor Motor: Ataxia Motor - Skilled Clinical Observations: generalized weakness Mobility  Transfers Sit to Stand: Moderate Assistance - Patient 50-74% Stand to Sit: Moderate Assistance - Patient 50-74%  Trunk/Postural Assessment  Postural  Control Postural Control: Deficits on evaluation Trunk Control: posterior lean in sitting, L lean in standing Righting Reactions: delayed  Balance Static Sitting Balance Static Sitting - Level of Assistance: 4: Min assist Dynamic Sitting Balance Dynamic Sitting - Level of Assistance: 3: Mod assist Sitting balance - Comments: posterior lean Static Standing Balance Static Standing - Level of Assistance: 3: Mod assist Dynamic Standing Balance Dynamic Standing - Level of Assistance: 2: Max assist Extremity/Trunk Assessment RUE Assessment Active Range of Motion (AROM) Comments: sh flexion 110 General Strength Comments: 4-/5 overall LUE Assessment Active Range of Motion (AROM) Comments: sh flexion 110 General Strength Comments: 4-/5 overall     Refer to Care Plan for Long Term Goals  Recommendations for  other services: Other: vestibular eval   Discharge Criteria: Patient will be discharged from OT if patient refuses treatment 3 consecutive times without medical reason, if treatment goals not met, if there is a change in medical status, if patient makes no progress towards goals or if patient is discharged from hospital.  The above assessment, treatment plan, treatment alternatives and goals were discussed and mutually agreed upon: by patient  [facility] 10/25/2019, 10:56 AM

## 2019-10-25 NOTE — Evaluation (Signed)
Physical Therapy Assessment and Plan  Patient Details  Name: [name] [name] MRN: [id number] Date of Birth: [birth year]  PT Diagnosis: Abnormal posture, Abnormality of gait, Cognitive deficits, Coordination disorder, Difficulty walking, Dizziness and giddiness, Impaired cognition, Muscle weakness and Vertigo of central origin Rehab Potential: Good ELOS: 12-14 days   Today's Date: 10/25/2019 PT Individual Time: 1110-1210 PT Individual Time Calculation (min): 60 min    Problem List:  Patient Active Problem List   Diagnosis Date Noted  . 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 History of repair of dissecting thoracic aneurysm   . Abdominal distension   . Pulmonary embolus ([place]) 10/19/2019  . CVA (cerebral vascular accident) ([place]) 10/19/2019  . AKI (acute kidney injury) ([place]) 10/19/2019  . 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 Ileus ([place]) 10/19/2019  . 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 Radiculopathy 12/15/2014    Past Medical History:  Past Medical History:  Diagnosis Date  . Chronic back pain    stenosis  . Enlarged prostate   . GERD (gastroesophageal reflux disease)    occasionally will take a zantac(maybe once a month)  . History of colon polyps   . History of kidney stones   . History of stress test    done 10 yrs. ago, as a baseline   . Hyperlipidemia    takes Crestor daily  . Hypertension    takes Amlodipine daily and Lotensin as well   Past Surgical History:  Past Surgical History:  Procedure Laterality Date  . ANTERIOR LAT LUMBAR FUSION Left 12/15/2014   Procedure: ANTERIOR LATERAL LUMBAR FUSION 1 LEVEL;  Surgeon: Phylliss Bob, MD;  Location: [facility];  Service: Orthopedics;  Laterality: Left;  Left sided lateral lumbar interbody fusion, lumbar 3-4, posterior spinal fusion, lumbar 3-4  with instrumentation.  . COLONOSCOPY    . ESOPHAGOGASTRODUODENOSCOPY    . fatty tissue removed from stomach    . LUMBAR LAMINECTOMY/DECOMPRESSION MICRODISCECTOMY N/A 07/08/2013   Procedure: LUMBAR LAMINECTOMY/DECOMPRESSION MICRODISCECTOMY;  Surgeon: Sinclair Ship, MD;  Location: [facility];  Service: Orthopedics;  Laterality: N/A;  Lumbar 3-4, lumbar 4-5 decompression  . RADIOLOGY WITH ANESTHESIA N/A 10/22/2019   Procedure: MRI WITH ANESTHESIA OF LUMBAR SPINE WITHOUT CONTRAST;  Surgeon: Radiologist, Medication, MD;  Location: [facility];  Service: Radiology;  Laterality: N/A;  . right ankle surgery     as child   . THORACIC AORTIC ENDOVASCULAR STENT GRAFT N/A 10/15/2019   Procedure: THORACIC AORTIC ENDOVASCULAR STENT GRAFT;  Surgeon: Waynetta Sandy, MD;  Location: [facility] OR;  Service: Vascular;  Laterality: N/A;  . TONSILLECTOMY     as a child    Assessment & Plan Clinical Impression: Patient is a 74 y.o. year old right-handed male with history of chronic back pain with lumbar discectomy 2014 as well as anterior lateral lumbar fusion 2016, BPH, hypertension, diabetes mellitus, hyperlipidemia and remote tobacco abuse.  Per chart review lives with spouse independent prior to admission.  Two-level home bed and bath on main level.  Presented 10/08/2019 with lower chest and back pain with epigastric discomfort times several weeks as well as bouts of nausea and vomiting.  In the ED blood pressure 180s/130s.  EKG showed nonspecific ST-T changes not significantly different from prior EKG 2016.  First troponin HS 28, potassium 3.3, creatinine 1.25, WBC 14,100, hemoglobin 14.5, SARS coronavirus negative.  Chest x-ray no  active disease.  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  Small pulmonary embolus involving the central right lobe pulmonary artery as well as probable  additional small subsegmental embolus in the right lower lobe.  Echocardiogram with ejection fraction 65%.  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 Donzetta Matters.  Placed on intravenous heparin therapy.  Neurology consulted 10/17/2019 for altered mental status.  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  Carotid Dopplers with no ICA stenosis.  Neurology follow-up and heparin was transitioned to Eliquis.  Hospital course blood loss anemia 9.3 and monitored with follow-up per GI services there was some fear of possible GI bleed related to intravenous heparin as he was transition to Eliquis no other overt bleeding noted..  Tolerating a regular consistency diet.  Patient was admitted for a comprehensive rehab program. Patient transferred to [facility] on 10/24/2019 .   Patient currently requires mod with mobility secondary to muscle weakness, decreased cardiorespiratoy endurance, impaired timing and sequencing, motor apraxia, decreased coordination and decreased motor planning, decreased visual acuity and decreased visual motor skills, decreased initiation, decreased problem solving, decreased safety awareness, decreased memory and delayed processing, central origin and decreased sitting balance, decreased standing balance, decreased postural control and decreased balance strategies.  Prior to hospitalization, patient was independent  with mobility and lived with Spouse in a House home.  Home access is  Level entry.  Patient will benefit from skilled PT intervention to maximize safe functional mobility, minimize fall risk and decrease caregiver burden for planned discharge home with 24 hour supervision.  Anticipate patient will benefit from follow up OP at discharge.  PT - End of Session Activity Tolerance: Tolerates 30+ min activity with multiple rests Endurance Deficit: Yes Endurance Deficit Description: requires frequent rest  breaks due to SOB and faitgue with minimal activity PT Assessment Rehab Potential (ACUTE/IP ONLY): Good PT Barriers to Discharge: Home environment access/layout PT Barriers to Discharge Comments: Patient has 2 level home, can stay on first floor, but patient's bedroom and bathroom are on the second floor PT Patient demonstrates impairments in the following area(s): Balance;Behavior;Edema;Endurance;Motor;Nutrition;Perception;Pain;Safety;Sensory;Skin Integrity PT Transfers Functional Problem(s): Bed Mobility;Bed to Chair;Car;Furniture;Floor PT Locomotion Functional Problem(s): Ambulation;Wheelchair Mobility PT Plan PT Intensity: Minimum of 1-2 x/day ,45 to 90 minutes PT Frequency: 5 out of 7 days PT Duration Estimated Length of Stay: 12-14 days PT Treatment/Interventions: Ambulation/gait training;Discharge planning;Functional mobility training;Psychosocial support;Therapeutic Activities;Visual/perceptual remediation/compensation;Balance/vestibular training;Disease management/prevention;Neuromuscular re-education;Skin care/wound management;Therapeutic Exercise;Wheelchair propulsion/positioning;Cognitive remediation/compensation;DME/adaptive equipment instruction;Pain management;Splinting/orthotics;UE/LE Strength taining/ROM;Community reintegration;Functional electrical stimulation;Patient/family education;Stair training;UE/LE Coordination activities PT Transfers Anticipated Outcome(s): supervision using LRAD PT Locomotion Anticipated Outcome(s): supervision 100 ft using LRAD PT Recommendation Recommendations for Other Services: Neuropsych consult;Therapeutic Recreation consult Therapeutic Recreation Interventions: Stress management Follow Up Recommendations: Outpatient PT Patient destination: Home Equipment Recommended: To be determined Equipment Details: Patient has no DME,DME needs to be determined as patient progresses  Skilled Therapeutic Intervention In addition to the PT evaluation below,  the patient performed the following skilled PT interventions: Patient in bed upon PT arrival. Patient alert and agreeable to PT session. Patient denied pain during session.   Therapeutic Activity: Bed Mobility: Patient performed rolling R/L and supine to/from sit with min A for trunk and LE management in a flat bed without use of bed rails. Patient stat EOB with min A for trunk support due to posterior lean. Provided cues  for anterior lean to find midline, patient able to self correct x2 with min A progressing to supervision for sitting balance. Did return to posterior lean with fatigue at end of session.  Transfers: Patient performed sit to/from stand, stand pivot, and squat pivot with mod A without an AD. He then performed sit to stand x2 and stand pivot transfers x3 with min-mod A with a RW. Provided verbal cues for hand placement, forward weight shift, scooting forward in the chair, hip and knee extension in standing, and sequencing. Patient would often sit prior to completing a pivot and required heavy cues and facilitation to complete turn. Patient performed a simulated sedan height car transfer, patient reports he will need to be able to perform a SUV height car transfer at d/c, with mod-max A due to posterior lean without an AD. Patient leaned far backwards when sitting into the car, requiring max A to control descent and protect patients head from hitting the frame on the way in.  Gait Training:  Patient ambulated 4 feet without and AD with mod A with strong posterior and mild L lean in a crouched position. Patient requested to sit due to dizziness and fear of falling. He then ambulated 15 feet using RW with mod A. Limited distance due to fatigue and SOB. Ambulated with narrow BOS, crouched posture, very small step length and height, and strong posterior lean requiring PT to be behind the patient to provided manual facilitation for forward weight shift. Provided verbal cues for erect posture, hip  and knee extension, increased step length, and forward weight shift.  Wheelchair Mobility:  Patient propelled wheelchair 55 feet with min A-supervision due to veering L. Limited distance due to fatigue and SOB.  Patient in bed with his wife and IV nurse in the room at end of session with breaks locked, bed alarm set, and all needs within reach. Encouraged the patient to sit OOB for lunch, however he reported increased fatigue and requested to lie in bed at end of session.   Patient was SOB with minimal activity, O2 sats >95% throughout session. Gregary Signs, OT notified MD this morning after OT eval. He also complained of dizziness or "the room moving" with mobility and w/c transport during session.   Instructed pt in results of PT evaluation as detailed below, PT POC, rehab potential, rehab goals, and discharge recommendations. Additionally discussed CIR's policies regarding fall safety and use of chair alarm and/or quick release belt. Pt verbalized understanding and in agreement. Will update pt's family members as they become available.    PT Evaluation Precautions/Restrictions Precautions Precautions: Fall Restrictions Weight Bearing Restrictions: No General   Vital SignsTherapy Vitals Pulse Rate: 74 BP: 138/88 Patient Position (if appropriate): Sitting Oxygen Therapy SpO2: 100 % O2 Device: Room Air Pain Pain Assessment Pain Score: 0-No pain Home Living/Prior Functioning Home Living Available Help at Discharge: Family;Available 24 hours/day Type of Home: House Home Access: Level entry Home Layout: Two level;Able to live on main level with bedroom/bathroom Alternate Level Stairs-Number of Steps: 7 x2 separated by landing Alternate Level Stairs-Rails: Right Bathroom Shower/Tub: Tub/shower unit;Walk-in shower Bathroom Toilet: Standard Additional Comments: Patient's bedroom is on the second level, can stay in a bedroom on the first floor  Lives With: Spouse Prior Function Level of  Independence: Independent with basic ADLs;Independent with gait;Independent with transfers  Able to Take Stairs?: Yes Driving: Yes Vocation: Retired Comments: pt likes to Tax adviser, sing and be with his grandchildren and great grandchildren Vision/Perception  Vision -  Assessment Eye Alignment: Impaired (comment)(L eye diverted laterally) Ocular Range of Motion: Within Functional Limits Alignment/Gaze Preference: Within Defined Limits Tracking/Visual Pursuits: Able to track stimulus in all quads without difficulty Saccades: Within functional limits Convergence: Impaired (comment)(L eye does not converge) Diplopia Assessment: Disappears with one eye closed;Objects split side to side Perception Perception: Within Functional Limits Praxis Praxis: Impaired Praxis Impairment Details: Initiation;Motor planning Praxis-Other Comments: mod impaired  Cognition Overall Cognitive Status: Impaired/Different from baseline Arousal/Alertness: Awake/alert Orientation Level: Oriented to [place];Disoriented to time;Oriented to place;Oriented to situation Attention: Sustained Sustained Attention: Impaired Sustained Attention Impairment: Verbal basic;Functional basic Memory: Impaired Awareness: Impaired Awareness Impairment: Emergent impairment Problem Solving: Impaired Problem Solving Impairment: Functional basic Executive Function: Initiating Initiating: Impaired Initiating Impairment: Verbal basic;Functional basic Safety/Judgment: Appears intact Comments: delayed processing Sensation Sensation Light Touch: Appears Intact Proprioception: Appears Intact Coordination Gross Motor Movements are Fluid and Coordinated: No Fine Motor Movements are Fluid and Coordinated: No Coordination and Movement Description: slow movement patterns Finger Nose Finger Test: slow and delayed, no dysmmetria noted Heel Shin Test: slow and delayed, no dysmmetria noted Motor  Motor Motor: Motor  apraxia;Abnormal postural alignment and control Motor - Skilled Clinical Observations: decreased activity tolerance, poor mid-line orientation, delayed initiation and sequencing with functional tasks, decreased balance  Mobility Bed Mobility Bed Mobility: Rolling Right;Rolling Left;Sit to Supine;Supine to Sit Rolling Right: Minimal Assistance - Patient > 75% Rolling Left: Minimal Assistance - Patient > 75% Supine to Sit: Minimal Assistance - Patient > 75% Sit to Supine: Minimal Assistance - Patient > 75% Transfers Transfers: Sit to Stand;Stand to Sit;Stand Pivot Transfers;Squat Pivot Transfers Sit to Stand: Moderate Assistance - Patient 50-74% Stand to Sit: Moderate Assistance - Patient 50-74% Stand Pivot Transfers: Moderate Assistance - Patient 50 - 74% Stand Pivot Transfer Details: Manual facilitation for weight shifting Squat Pivot Transfers: Moderate Assistance - Patient 50-74% Transfer (Assistive device): None Locomotion  Gait Ambulation: Yes Gait Assistance: Moderate Assistance - Patient 50-74% Gait Distance (Feet): 4 Feet Assistive device: None Gait Assistance Details: Manual facilitation for weight shifting Gait Gait: Yes Gait Pattern: Narrow base of support;Decreased trunk rotation;Trunk flexed;Right foot flat;Left foot flat;Step-through pattern;Decreased stride length;Decreased weight shift to left;Decreased weight shift to right;Left flexed knee in stance;Right flexed knee in stance Gait velocity: decreased Stairs / Additional Locomotion Stairs: No(Patient with increased SOB with ambulation and decreased balance) Wheelchair Mobility Wheelchair Mobility: Yes Wheelchair Assistance: Minimal assistance - Patient >75% Wheelchair Propulsion: Both upper extremities Wheelchair Parts Management: Needs assistance Distance: 26'  Trunk/Postural Assessment  Cervical Assessment Cervical Assessment: Within Functional Limits Thoracic Assessment Thoracic Assessment: Within  Functional Limits Lumbar Assessment Lumbar Assessment: Within Functional Limits Postural Control Postural Control: Deficits on evaluation Trunk Control: posterior lean in sitting, posterior and mild L lean in standing Righting Reactions: delayed  Balance Balance Balance Assessed: Yes Static Sitting Balance Static Sitting - Level of Assistance: 4: Min assist Dynamic Sitting Balance Dynamic Sitting - Level of Assistance: 3: Mod assist Sitting balance - Comments: posterior lean Static Standing Balance Static Standing - Level of Assistance: 3: Mod assist Dynamic Standing Balance Dynamic Standing - Level of Assistance: 2: Max assist Extremity Assessment  RUE Assessment RUE Assessment: Exceptions to [facility] Active Range of Motion (AROM) Comments: sh flexion 110 General Strength Comments: 4-/5 overall LUE Assessment LUE Assessment: Exceptions to [facility] Active Range of Motion (AROM) Comments: sh flexion 110 General Strength Comments: 4-/5 overall RLE Assessment RLE Assessment: Exceptions to [facility] Active Range of Motion (AROM) Comments: WFL for all functional mobility General Strength  Comments: Grossly in sitting: 4/5 throughout LLE Assessment LLE Assessment: Exceptions to [facility] Active Range of Motion (AROM) Comments: WFL for all functional mobility General Strength Comments: Grossly in sitting: 4/5 throughout    Refer to Care Plan for Long Term Goals  Recommendations for other services: Neuropsych and Therapeutic Recreation  Stress management  Discharge Criteria: Patient will be discharged from PT if patient refuses treatment 3 consecutive times without medical reason, if treatment goals not met, if there is a change in medical status, if patient makes no progress towards goals or if patient is discharged from hospital.  The above assessment, treatment plan, treatment alternatives and goals were discussed and mutually agreed upon: by patient  Doreene Burke PT, DPT  10/25/2019, 12:48  PM

## 2019-10-25 NOTE — Progress Notes (Signed)
[facility] PHYSICAL MEDICINE & REHABILITATION PROGRESS NOTE   Subjective/Complaints: [name] [name] has increased SOB this morning.  CXR ordered and personally reviewed and shows stable pleural effusion; will given 20mg  Lasix x1 Continues to be constipated, agreeable to enema today.    Objective:   DG CHEST PORT 1 VIEW  Result Date: 10/25/2019 CLINICAL DATA:  Shortness of breath. 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 Right PICC tip near the superior cavoatrial junction without significant change. Stable enlarged cardiac silhouette. Clear right lung. Moderate-sized left pleural effusion and left basilar airspace opacity with little change. No acute bony abnormality. IMPRESSION: 1. Stable moderate-sized left pleural effusion and left basilar atelectasis or pneumonia. 2. Stable cardiomegaly. Electronically Signed   By: Claudie Revering M.D.   On: 10/25/2019 10:49   Recent Labs    10/24/19 0500 10/25/19 0938  WBC 9.5 10.2  HGB 9.4* 10.2*  HCT 29.5* 31.4*  PLT 369 444*   Recent Labs    10/25/19 0938  NA 138  K 4.0  CL 106  CO2 22  GLUCOSE 177*  BUN 12  CREATININE 1.04  CALCIUM 8.7*    Intake/Output Summary (Last 24 hours) at 10/25/2019 1250 Last data filed at 10/25/2019 0829 Gross per 24 hour  Intake 238 ml  Output --  Net 238 ml     Physical Exam: Vital Signs Blood pressure 138/88, pulse 74, temperature 99.6 F (37.6 C), temperature source Oral, resp. rate 20, height 5\' 10"  (1.778 m), weight 91.4 kg, SpO2 100 %.  General: Alert and oriented x 2 (states month is February), No apparent distress HEENT: Head is normocephalic, atraumatic, PERRLA, EOMI, sclera anicteric, oral mucosa pink and moist, dentition intact, ext ear canals clear,  Neck: Supple without JVD or lymphadenopathy Heart: Reg rate and rhythm. No murmurs rubs or gallops Chest: CTA bilaterally without rales, or rhonchi; +wheezing,  increased work of breathing Abdomen: Soft, non-tender, distended, bowel sounds positive. Extremities: No clubbing, cyanosis, or edema. Pulses are 2+ Skin: Clean and intact without signs of breakdown Neuro: Pt is cognitively appropriate with normal insight, memory, and awareness. Cranial nerves 2-12 are intact. Delayed processing. Follows commands. ModA bed mobility. 4+/5 strength throughout Psych: Pt's affect is appropriate. Pt is cooperative    Assessment/Plan: 1. 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  Physiatrist is providing close team supervision and 24 hour management of active medical problems listed below.  Physiatrist and rehab team continue to assess barriers to discharge/monitor patient progress toward functional and medical goals  Care Tool:  Bathing    Body parts bathed by patient: Right arm, Left arm, Chest, Abdomen, Front perineal area, Right upper leg, Left upper leg, Face   Body parts bathed by helper: Buttocks, Right lower leg, Left lower leg     Bathing assist Assist Level: Moderate Assistance - Patient 50 - 74%     Upper Body Dressing/Undressing Upper body dressing   What is the patient wearing?: Pull over shirt    Upper body assist Assist Level: Minimal Assistance - Patient > 75%    Lower Body Dressing/Undressing Lower body dressing      What is the patient wearing?: Pants, Incontinence brief     Lower body assist Assist for  lower body dressing: Maximal Assistance - Patient 25 - 49%     Toileting Toileting    Toileting assist Assist for toileting: Maximal Assistance - Patient 25 - 49%(can be incont, doesn't recognize when wet) Assistive Device Comment: urinal   Transfers Chair/bed  transfer  Transfers assist     Chair/bed transfer assist level: Moderate Assistance - Patient 50 - 74%     Locomotion Ambulation   Ambulation assist      Assist level: Moderate Assistance - Patient 50 - 74% Assistive device: No Device Max distance: 4'   Walk 10 feet activity   Assist  Walk 10 feet activity did not occur: Safety/medical concerns(decreased balance/activity tolerance)        Walk 50 feet activity   Assist Walk 50 feet with 2 turns activity did not occur: Safety/medical concerns(decreased balance/activity tolerance)         Walk 150 feet activity   Assist Walk 150 feet activity did not occur: Safety/medical concerns(decreased balance/activity tolerance)         Walk 10 feet on uneven surface  activity   Assist Walk 10 feet on uneven surfaces activity did not occur: Safety/medical concerns(decreased balance/activity tolerance)         Wheelchair     Assist   Type of Wheelchair: Manual    Wheelchair assist level: Minimal Assistance - Patient > 75% Max wheelchair distance: 77'    Wheelchair 50 feet with 2 turns activity    Assist        Assist Level: Minimal Assistance - Patient > 75%   Wheelchair 150 feet activity     Assist      Assist Level: Maximal Assistance - Patient 25 - 49%   Blood pressure 138/88, pulse 74, temperature 99.6 F (37.6 C), temperature source Oral, resp. rate 20, height 5\' 10"  (1.778 m), weight 91.4 kg, SpO2 100 %.  Medical Problem List and Plan: 1.  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 May d/c PICC 2.  Antithrombotics: -DVT/anticoagulation/pulmonary emboli: Eliquis             -antiplatelet therapy: N/A 3. Pain Management: Neurontin 300 mg nightly, Lidoderm patch.  Well controlled.  4. Mood: Provide emotional support             -antipsychotic agents: N/A 5. Neuropsych: This patient is capable of making decisions on his own behalf. 6. Skin/Wound Care: Routine skin checks 7. Fluids/Electrolytes/Nutrition: Routine in and outs with follow-up chemistries 8.  Acute blood loss anemia.  Hgb stable on 3/7 9.  Hypertension.  Norvasc 10 mg daily, Coreg 25 mg twice daily, hydralazine as directed. Well controlled.  10.  BPH.  Flomax 11.  Diabetes mellitus.  Hemoglobin A1c 6.7.  SSI.  Patient on Glucophage 500 mg twice daily prior to admission.  Resume as needed 12.  Hyperlipidemia.  Lipitor 13. Constipation:   3/7: ordered enema for this morning.  14. Pleural effusions: CXR ordered 3/7 for shortness of breath and personally reviewed and shows stable pleural effusion; will given 20mg  Lasix x1. WBC is stable today. Afebrile.  15. Disposition: Lives with wife who is at home and will be able to help him. Has 3 daughters whom he  is very close with who also live nearby.      LOS: 1 days A FACE TO FACE EVALUATION WAS PERFORMED  [name] [name] Marios Gaiser 10/25/2019, 12:50 PM

## 2019-10-25 NOTE — Progress Notes (Signed)
VAS team notified via order set about pt arrival to unit with PICC in [place]. CHG order set initiated via protocol. Double lumen PICC in RUE capped and locked. Will defer to VAS to flush per protocol. No IV meds ordered at this time and pt had PIV in left FA. Question whether PICC can be removed?

## 2019-10-25 NOTE — Progress Notes (Signed)
Pt doesn't want CPAP at this time.  RT will continue to monitor.

## 2019-10-26 ENCOUNTER — Inpatient Hospital Stay (HOSPITAL_COMMUNITY): Payer: Medicare Other | Admitting: Speech Pathology

## 2019-10-26 ENCOUNTER — Inpatient Hospital Stay (HOSPITAL_COMMUNITY): Payer: Medicare Other

## 2019-10-26 ENCOUNTER — Inpatient Hospital Stay (HOSPITAL_COMMUNITY): Payer: Medicare Other | Admitting: Occupational Therapy

## 2019-10-26 LAB — CBC WITH DIFFERENTIAL/PLATELET
Abs Immature Granulocytes: 0.07 10*3/uL (ref 0.00–0.07)
Basophils Absolute: 0 10*3/uL (ref 0.0–0.1)
Basophils Relative: 0 %
Eosinophils Absolute: 0.2 10*3/uL (ref 0.0–0.5)
Eosinophils Relative: 2 %
HCT: 30.8 % — ABNORMAL LOW (ref 39.0–52.0)
Hemoglobin: 10.1 g/dL — ABNORMAL LOW (ref 13.0–17.0)
Immature Granulocytes: 1 %
Lymphocytes Relative: 10 %
Lymphs Abs: 0.9 10*3/uL (ref 0.7–4.0)
MCH: 29 pg (ref 26.0–34.0)
MCHC: 32.8 g/dL (ref 30.0–36.0)
MCV: 88.5 fL (ref 80.0–100.0)
Monocytes Absolute: 0.9 10*3/uL (ref 0.1–1.0)
Monocytes Relative: 10 %
Neutro Abs: 6.9 10*3/uL (ref 1.7–7.7)
Neutrophils Relative %: 77 %
Platelets: 402 10*3/uL — ABNORMAL HIGH (ref 150–400)
RBC: 3.48 MIL/uL — ABNORMAL LOW (ref 4.22–5.81)
RDW: 13.7 % (ref 11.5–15.5)
WBC: 9 10*3/uL (ref 4.0–10.5)
nRBC: 0 % (ref 0.0–0.2)

## 2019-10-26 LAB — COMPREHENSIVE METABOLIC PANEL
ALT: 101 U/L — ABNORMAL HIGH (ref 0–44)
AST: 57 U/L — ABNORMAL HIGH (ref 15–41)
Albumin: 2.2 g/dL — ABNORMAL LOW (ref 3.5–5.0)
Alkaline Phosphatase: 78 U/L (ref 38–126)
Anion gap: 10 (ref 5–15)
BUN: 10 mg/dL (ref 8–23)
CO2: 22 mmol/L (ref 22–32)
Calcium: 8.4 mg/dL — ABNORMAL LOW (ref 8.9–10.3)
Chloride: 105 mmol/L (ref 98–111)
Creatinine, Ser: 0.93 mg/dL (ref 0.61–1.24)
GFR calc Af Amer: >60 mL/min (ref >60)
GFR calc non Af Amer: >60 mL/min (ref >60)
Glucose, Bld: 121 mg/dL — ABNORMAL HIGH (ref 70–99)
Potassium: 3.7 mmol/L (ref 3.5–5.1)
Sodium: 137 mmol/L (ref 135–145)
Total Bilirubin: 1.1 mg/dL (ref 0.3–1.2)
Total Protein: 5.9 g/dL — ABNORMAL LOW (ref 6.5–8.1)

## 2019-10-26 LAB — GLUCOSE, CAPILLARY
Glucose-Capillary: 122 mg/dL — ABNORMAL HIGH (ref 70–99)
Glucose-Capillary: 170 mg/dL — ABNORMAL HIGH (ref 70–99)
Glucose-Capillary: 142 mg/dL — ABNORMAL HIGH (ref 70–99)
Glucose-Capillary: 120 mg/dL — ABNORMAL HIGH (ref 70–99)
Glucose-Capillary: 147 mg/dL — ABNORMAL HIGH (ref 70–99)
Glucose-Capillary: 109 mg/dL — ABNORMAL HIGH (ref 70–99)
Glucose-Capillary: 127 mg/dL — ABNORMAL HIGH (ref 70–99)
Glucose-Capillary: 131 mg/dL — ABNORMAL HIGH (ref 70–99)
Glucose-Capillary: 144 mg/dL — ABNORMAL HIGH (ref 70–99)

## 2019-10-26 NOTE — IPOC Note (Signed)
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     Functional Problem List: Nursing Bowel, Bladder, Medication Management, Endurance, Skin Integrity, Safety  PT Balance, Behavior, Edema, Endurance, Motor, Nutrition, Perception, Pain, Safety, Sensory, Skin Integrity  OT Balance, Cognition, Endurance, Motor, Perception, Safety, Vision  SLP Cognition  TR         Basic ADL's: OT Eating, Grooming, Bathing, Dressing, Toileting     Advanced  ADL's: OT       Transfers: PT Bed Mobility, Bed to Chair, Car, Furniture, Floor  OT Toilet, Metallurgist: PT Ambulation, Wheelchair Mobility     Additional Impairments: OT None  SLP Social Cognition   Problem Solving, Memory, Attention, Awareness  TR      Anticipated Outcomes Item Anticipated Outcome  Self Feeding set up  Swallowing      Basic self-care  Supervision  Toileting  Supervision   Bathroom Transfers Supervision  Bowel/Bladder  cont x 2 with min assist  Transfers  supervision using LRAD  Locomotion  supervision 100 ft using LRAD  Communication     Cognition  Supervision  Pain  less than 3  Safety/Judgment  min assist   Therapy Plan: PT Intensity: Minimum of 1-2 x/day ,45 to 90 minutes PT Frequency: 5 out of 7 days PT Duration Estimated Length of Stay: 12-14 days OT Intensity: Minimum of 1-2 x/day, 45 to 90 minutes OT Frequency: 5 out of 7 days OT Duration/Estimated Length of Stay: 10-14 days SLP Intensity: Minumum of 1-2 x/day, 30 to 90 minutes SLP Frequency: 3 to 5 out of 7 days SLP Duration/Estimated Length of Stay: 10-14 days   Due to the current state of emergency, patients may not be receiving their 3-hours of Medicare-mandated therapy.   Team  Interventions: Nursing Interventions Patient/Family Education, Bladder Management, Bowel Management, Medication Management, Skin Care/Wound Management, Cognitive Remediation/Compensation, Discharge Planning  PT interventions Ambulation/gait training, Discharge planning, Functional mobility training, Psychosocial support, Therapeutic Activities, Visual/perceptual remediation/compensation, Balance/vestibular training, Disease management/prevention, Neuromuscular re-education, Skin care/wound management, Therapeutic Exercise, Wheelchair propulsion/positioning, Cognitive remediation/compensation, DME/adaptive equipment instruction, Pain management, Splinting/orthotics, UE/LE Strength taining/ROM, Community reintegration, Technical sales engineer stimulation, Patient/family education, IT trainer, UE/LE Coordination activities  OT Interventions Training and development officer, Cognitive remediation/compensation, Discharge planning, Neuromuscular re-education, Functional mobility training, DME/adaptive equipment instruction, Patient/family education, Psychosocial support, Self Care/advanced ADL retraining, Therapeutic Activities, Therapeutic Exercise, UE/LE Strength taining/ROM, UE/LE Coordination activities, Visual/perceptual remediation/compensation  SLP Interventions Cognitive remediation/compensation, Cueing hierarchy, Functional tasks, Internal/external aids, Patient/family education  TR Interventions    SW/CM Interventions     Barriers to Discharge MD  Medical stability  Nursing      PT Home environment access/layout Patient has 2 level home, can stay on first floor, but patient's bedroom and bathroom are on the second floor  OT      SLP      SW       Team Discharge Planning: Destination: PT-Home ,OT- Home , SLP-Home Projected Follow-up: PT-Outpatient PT, OT-  Home health OT, SLP-Home Health SLP, 24 hour supervision/assistance Projected Equipment Needs: PT-To be determined, OT- Tub/shower bench,  SLP-None recommended by SLP Equipment Details: PT-Patient has no DME,DME needs to be determined as patient progresses, OT-  Patient/family involved in discharge planning: PT- Patient,  OT-Patient, SLP-Patient, Family member/caregiver  MD ELOS: 10-14d Medical Rehab Prognosis:  Good Assessment:  74 year old right-handed male with history of chronic back pain with lumbar discectomy 2014 as well as anterior lateral lumbar fusion 2016, BPH, hypertension, diabetes mellitus, hyperlipidemia and remote tobacco abuse.  Per chart review lives with spouse independent prior to admission.  Two-level home bed and bath on main level.  Presented 10/08/2019 with lower chest and back pain with epigastric discomfort times several weeks as well as bouts of nausea and vomiting.  In the ED blood pressure 180s/130s.  EKG showed nonspecific ST-T changes not significantly different from prior EKG 2016.  First troponin HS 28, potassium 3.3, creatinine 1.25, WBC 14,100, hemoglobin 14.5, SARS coronavirus negative.  Chest x-ray no active disease.  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  Small pulmonary embolus involving the central right lobe pulmonary artery as well as probable additional small subsegmental embolus in the right lower lobe.  Echocardiogram with ejection fraction 65%.  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 Donzetta Matters.  Placed on intravenous heparin therapy.  Neurology consulted 10/17/2019 for altered mental status.  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  Carotid Dopplers with no ICA stenosis.  Neurology follow-up and heparin was transitioned to Eliquis.  Hospital course blood loss anemia 9.3 and monitored with follow-up per GI services there was some fear of possible GI bleed related to  intravenous heparin as he was transition to Eliquis no other overt bleeding noted..     Now requiring 24/7 Rehab RN,MD, as well as CIR level PT, OT and SLP.  Treatment team will focus on ADLs and mobility with goals set at Sup See Team Conference Notes for weekly updates to the plan of care

## 2019-10-26 NOTE — Progress Notes (Signed)
Occupational Therapy Session Note  Patient Details  Name: [name] [name] MRN: [id number] Date of Birth: February 02, 1946  Today's Date: 10/26/2019 OT Individual Time: 0800-0900 OT Individual Time Calculation (min): 60 min    Short Term Goals: Week 1:  OT Short Term Goal 1 (Week 1): Pt will be able to sit to stand to RW with CGA. OT Short Term Goal 2 (Week 1): Pt will be able to ambulate to bathroom with CGA. OT Short Term Goal 3 (Week 1): Pt will be able to toilet with min A. OT Short Term Goal 4 (Week 1): Pt will be able to don pants with min A. OT Short Term Goal 5 (Week 1): Pt will demonstrate improved postural control to sit EOB with S only.  Skilled Therapeutic Interventions/Progress Updates:    Pt in bed finishing breakfast to start session.  He was able to transfer from supine to sit EOB with min assist.  Static sitting balance overall mod assist secondary to increased LOB posteriorly.  He transferred to the wheelchair stand pivot with mod assist in order to work on bathing, dressing, and grooming tasks at the sink.  He was oriented to the day of the week but not the month or day of the month.  He was able to state reason for hospitalization.  He needed mod instructional cueing for sequencing bathing tasks with mod assist for crossing his LEs over the opposite knee for washing his lower legs and feet.  Mod assist for sit to stand during LB bathing and dressing with slight increased lean to the left and increased trunk and knee flexion the longer he stood.  Dyspnea 2/4 with O2 sats at 95% on room air and HR at 85.  Increased processing time needed for dressing tasks with mod assist for donning a pullover shirt and mod assist for donning pants.  He initially tried to place the LUE through the neck hole of the shirt without awareness he was doing this.  He needed total assist for donning TEDs with min assist for slide on shoes.  Finished session with setup for brushing his teeth.  Pt left in the  wheelchair with call button and phone in reach and safety belt in place.    Therapy Documentation Precautions:  Precautions Precautions: Fall Restrictions Weight Bearing Restrictions: No  Pain: Pain Assessment Pain Scale: Faces Pain Score: 0-No pain ADL: See Care Tool Section for some details of mobility and selfcare  Therapy/Group: Individual Therapy  Zaheer Wageman,Trooper OTR/L 10/26/2019, 9:00 AM

## 2019-10-26 NOTE — Progress Notes (Addendum)
Physical Therapy Session Note  Patient Details  Name: [name] [name] MRN: [id number] Date of Birth: Aug 04, 1946  Today's Date: 10/26/2019 PT Individual Time: 1052-1202 PT Individual Time Calculation (min): 70 min    Short Term Goals: Week 1:  PT Short Term Goal 1 (Week 1): Patient will perform bed mobility with supervision. PT Short Term Goal 2 (Week 1): Patient will perform basic transfer with CGA using LRAD. PT Short Term Goal 3 (Week 1): Patient will ambulate >50 ft using LRAD. PT Short Term Goal 4 (Week 1): Patient will initiate stair training.  Skilled Therapeutic Interventions/Progress Updates:    Patient supine in bed upon PT arrival, agreeable to thearpy tx, denies pain. Supine > sitting EOB with minA for trunk upright and for balance until pt able to lean forward, consistent cueing required to lean forward as pt has significant posterior lean. While sitting EOB, pt reached for therapist's hand with each UE one at a time demonstrating increased posterior trunk lean. Throughout session, pt performed sliding hands down legs to stretch low back and facilitate forward trunk lean. Therapist donned B shoes totalA. Stand pivot transfer at [facility] with minA and mod cueing for sequencing and technique. MD in.out for morning assessment. Pt transported to dayroom in w/c for energy conservation. Therapist assessed vitals throughout session after activity as pt becomes SOB: HR 73-85 bpm SpO2 97-100%. Stand pivot transfer from w/c to mat table with modA as pt unable to bring trunk forward and had difficulty fully extended BLEs for upright posture. Once seated on mat, pt participated in reaching for bean bags x 10 each UE and passing them back to therapist x 10 each UE, at various heights and distances to facilitate anterior trunk leaning, crossing midline and weight shifting; noted that pt has significant posterior pelvic tilt in sitting. Pt participated in high perched sitting balance with unilateral UE reaching  forward, attempted BUE forward reach without success as pt felt very unsteady and would put his hands back on the mat to stabilize. Pt required consistent cueing throughout session when sitting to keep hands on his thighs to facilitate forward trunk lean. Sit > standing in front of mat table with RW and minA x 3 with attempt reduce to no UE support, pt demonstrating significant posterior trunk leaning, negatively responds to guarding at waist level as pt responds to tactile input and feeds into it. Multiple bouts of sit > standing in front of mat table from standard ht and high perched height without AD and with min-modA + B HHA, therapist provided intermittent minA to steady but allowed pt to feel weight displacement to allow him to correct posture, cueing for upright posture. Pt appeared significantly fatigued at end of session with increased posterior pelvic tilt and trunk leaning requiring CGA-minA to stabilize in sitting. Stand pivot transfer to w/c without AD and with minA, cues for sequencing. Pt transported back to room, left seated in w/c with needs in reach and chair alarm set.   Therapy Documentation Precautions:  Precautions Precautions: Fall Restrictions Weight Bearing Restrictions: No   Therapy/Group: Individual Therapy  Juliann Pulse SPT 10/26/2019, 7:59 AM

## 2019-10-26 NOTE — Progress Notes (Signed)
Speech Language Pathology Daily Session Note  Patient Details  Name: [name] [name] MRN: [id number] Date of Birth: 21-May-1946  Today's Date: 10/26/2019 SLP Individual Time: [id number] SLP Individual Time Calculation (min): 57 min  Short Term Goals: Week 1: SLP Short Term Goal 1 (Week 1): Pt will utilize external aids to recall mildly complex daily information with min assist verbal cues. SLP Short Term Goal 2 (Week 1): Pt will complete mildly complex functional tasks with min assist for functional problem solving. SLP Short Term Goal 3 (Week 1): Pt will recognize and correct errors during mildly complex functional tasks with min assist verbal cues. SLP Short Term Goal 4 (Week 1): Pt will sustain his attention to functional tasks for 10 minute intervals with min verbal cues for redirection.  Skilled Therapeutic Interventions: Pt was seen for skilled ST targeting cognitive goals. SLP facilitated session with overall Max A verbal and visual cues during a basic medication management task from the [facility]. Specifically, pt required cueing in order to locate information on medication labels, as well as interpret that information to answer basic problem solving questions and organize a QID pill chart. Pt with good awareness of the level of difficulty this task presented when questioned. Of note, pt also exhibits delayed processing and required extra time to provide verbal responses to questions as well as to complete functional tasks during session. Pt's wife arrived ~1/2 way through session and was receptive to education regarding current cognitive status and strategies for most effective functioning at this time. Recommended that while we will target medication and finance management, given these are tasks pt was responsible for prior to admission, it is anticipated she will need to provide assistance with those tasks at discharge, which she acknowledged. Pt left sitting in wheelchair with alarm set and needs  within reach. Continue per current plan of care.       Pain Pain Assessment Pain Scale: 0-10 Pain Score: 0-No pain  Therapy/Group: Individual Therapy  Arbutus Leas 10/26/2019, 3:22 PM

## 2019-10-26 NOTE — Progress Notes (Signed)
Patient information reviewed and entered into [facility] by Becky Aydin Hink, PPS coordinator. Information including medical coding, function ability, and quality indicators will be reviewed and updated through discharge.   

## 2019-10-26 NOTE — Progress Notes (Signed)
[facility] PHYSICAL MEDICINE & REHABILITATION PROGRESS NOTE   Subjective/Complaints:    No breathing issues, up in WC with PT, OT did ADLs today   ROS- neg CP, SOB, N/V/D   Objective:   DG CHEST PORT 1 VIEW  Result Date: 10/25/2019 CLINICAL DATA:  Shortness of breath. 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 Right PICC tip near the superior cavoatrial junction without significant change. Stable enlarged cardiac silhouette. Clear right lung. Moderate-sized left pleural effusion and left basilar airspace opacity with little change. No acute bony abnormality. IMPRESSION: 1. Stable moderate-sized left pleural effusion and left basilar atelectasis or pneumonia. 2. Stable cardiomegaly. Electronically Signed   By: Claudie Revering M.D.   On: 10/25/2019 10:49   Recent Labs    10/25/19 0938 10/26/19 0528  WBC 10.2 9.0  HGB 10.2* 10.1*  HCT 31.4* 30.8*  PLT 444* 402*   Recent Labs    10/25/19 0938 10/26/19 0528  NA 138 137  K 4.0 3.7  CL 106 105  CO2 22 22  GLUCOSE 177* 121*  BUN 12 10  CREATININE 1.04 0.93  CALCIUM 8.7* 8.4*    Intake/Output Summary (Last 24 hours) at 10/26/2019 1103 Last data filed at 10/26/2019 0818 Gross per 24 hour  Intake 470 ml  Output 625 ml  Net -155 ml     Physical Exam: Vital Signs Blood pressure (!) 160/78, pulse 73, temperature 98.5 F (36.9 C), temperature source Oral, resp. rate 17, height 5\' 10"  (1.778 m), weight 85.1 kg, SpO2 98 %.   General: No acute distress Mood and affect are appropriate Heart: Regular rate and rhythm no rubs murmurs or extra sounds Lungs: Clear to auscultation, breathing unlabored, no rales or wheezes Abdomen: Positive bowel sounds, soft nontender to palpation, nondistended Extremities: No clubbing, cyanosis, or edema Skin: No evidence of breakdown, no evidence of rash Neurologic: Cranial nerves II through XII intact, motor strength is  4/5 bilateral Delt, Bi, tri, grip, 4- R HF, 4/5 Left HF, 4/5 bilateral KE and ADF Sensory exam normal sensation to light touch and proprioception in bilateral upper and lower extremities Cerebellar exam normal finger to nose to finger as well as heel to shin in bilateral upper and lower extremities Musculoskeletal: Full range of motion in all 4 extremities. No joint swelling    Assessment/Plan: 1. 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  Physiatrist is providing close team supervision and 24 hour management of active medical problems listed below.  Physiatrist and rehab team continue to assess barriers to discharge/monitor patient progress toward functional and medical goals  Care Tool:  Bathing    Body parts bathed by patient: Right arm, Left arm, Chest, Abdomen, Front perineal area, Right upper leg, Left upper leg, Face, Right lower leg, Left lower leg   Body parts bathed by helper: Buttocks     Bathing assist Assist Level: Moderate Assistance - Patient 50 - 74%     Upper Body Dressing/Undressing Upper body dressing   What is the patient wearing?: Pull over shirt    Upper body assist Assist Level: Moderate Assistance - Patient 50 - 74%    Lower Body Dressing/Undressing Lower body dressing      What is the patient wearing?: Pants, Incontinence brief  Lower body assist Assist for lower body dressing: Maximal Assistance - Patient 25 - 49%     Toileting Toileting    Toileting assist Assist for toileting: Minimal Assistance - Patient > 75% Assistive Device Comment: Urinal   Transfers Chair/bed transfer  Transfers assist     Chair/bed transfer assist level: Moderate Assistance - Patient 50 - 74%(stand pivot)      Locomotion Ambulation   Ambulation assist      Assist level: Moderate Assistance - Patient 50 - 74% Assistive device: No Device Max distance: 4'   Walk 10 feet activity   Assist  Walk 10 feet activity did not occur: Safety/medical concerns(decreased balance/activity tolerance)        Walk 50 feet activity   Assist Walk 50 feet with 2 turns activity did not occur: Safety/medical concerns(decreased balance/activity tolerance)         Walk 150 feet activity   Assist Walk 150 feet activity did not occur: Safety/medical concerns(decreased balance/activity tolerance)         Walk 10 feet on uneven surface  activity   Assist Walk 10 feet on uneven surfaces activity did not occur: Safety/medical concerns(decreased balance/activity tolerance)         Wheelchair     Assist   Type of Wheelchair: Manual    Wheelchair assist level: Minimal Assistance - Patient > 75% Max wheelchair distance: 107'    Wheelchair 50 feet with 2 turns activity    Assist        Assist Level: Minimal Assistance - Patient > 75%   Wheelchair 150 feet activity     Assist      Assist Level: Maximal Assistance - Patient 25 - 49%   Blood pressure (!) 160/78, pulse 73, temperature 98.5 F (36.9 C), temperature source Oral, resp. rate 17, height 5\' 10"  (1.778 m), weight 85.1 kg, SpO2 98 %.  Medical Problem List and Plan: 1.  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 May d/c PICC 2.  Antithrombotics: -DVT/anticoagulation/pulmonary emboli: Eliquis             -antiplatelet therapy: N/A 3. Pain Management: Neurontin 300 mg nightly, Lidoderm patch. Well controlled.  4. Mood: Provide emotional support             -antipsychotic agents: N/A 5. Neuropsych:  This patient is capable of making decisions on his own behalf. 6. Skin/Wound Care: Routine skin checks 7. Fluids/Electrolytes/Nutrition: Routine in and outs with follow-up chemistries 8.  Acute blood loss anemia.  Hgb stable on 3/7 9.  Hypertension.  Norvasc 10 mg daily, Coreg 25 mg twice daily, hydralazine as directed. Well controlled.  Vitals:   10/25/19 2345 10/26/19 0408  BP: (!) 142/78 (!) 160/78  Pulse:  73  Resp:  17  Temp:  98.5 F (36.9 C)  SpO2:  [id number]  Systolic BP elevated today  Monitor for pattern  10.  BPH.  Flomax 11.  Diabetes mellitus.  Hemoglobin A1c 6.7.  SSI.  Patient on Glucophage 500 mg twice daily prior to admission.  Resume as needed CBG (last 3)  Recent Labs    10/25/19 1643 10/25/19 2101 10/26/19 0617  GLUCAP 113* 127* 109*  Controlled 3/8 12.  Hyperlipidemia.  Lipitor 13. Constipation:   3/7: ordered enema for this morning.  14. Pleural effusions:stable sized 15. Disposition: Lives with wife who is at home and will be able to help him. Has 3 daughters whom he is very close with who also live nearby.      LOS: 2 days A FACE TO FACE EVALUATION WAS PERFORMED  Charlett Blake 10/26/2019, 11:03 AM

## 2019-10-27 ENCOUNTER — Inpatient Hospital Stay (HOSPITAL_COMMUNITY): Payer: Medicare Other | Admitting: Speech Pathology

## 2019-10-27 ENCOUNTER — Inpatient Hospital Stay (HOSPITAL_COMMUNITY): Payer: Medicare Other | Admitting: Occupational Therapy

## 2019-10-27 ENCOUNTER — Inpatient Hospital Stay (HOSPITAL_COMMUNITY): Payer: Medicare Other

## 2019-10-27 LAB — GLUCOSE, CAPILLARY
Glucose-Capillary: 110 mg/dL — ABNORMAL HIGH (ref 70–99)
Glucose-Capillary: 130 mg/dL — ABNORMAL HIGH (ref 70–99)
Glucose-Capillary: 175 mg/dL — ABNORMAL HIGH (ref 70–99)
Glucose-Capillary: 105 mg/dL — ABNORMAL HIGH (ref 70–99)

## 2019-10-27 NOTE — Progress Notes (Signed)
Speech Language Pathology Daily Session Note  Patient Details  Name: [name] [name] MRN: [id number] Date of Birth: Feb 20, 1946  Today's Date: 10/27/2019 SLP Individual Time: [id number] SLP Individual Time Calculation (min): 58 min  Short Term Goals: Week 1: SLP Short Term Goal 1 (Week 1): Pt will utilize external aids to recall mildly complex daily information with min assist verbal cues. SLP Short Term Goal 2 (Week 1): Pt will complete mildly complex functional tasks with min assist for functional problem solving. SLP Short Term Goal 3 (Week 1): Pt will recognize and correct errors during mildly complex functional tasks with min assist verbal cues. SLP Short Term Goal 4 (Week 1): Pt will sustain his attention to functional tasks for 10 minute intervals with min verbal cues for redirection.  Skilled Therapeutic Interventions: Pt was seen for skilled ST targeting cognitive goals. During functional conversation, pt able to verbally recall daily events and also steps and techniques used during transfers (ex: from bed to chair) with overall Mod A verbal cues for recall and verbal sequencing and problem solving. Pt also required Mod A verbal and visual cues to complete basic and mildly complex PEG board designs. Pt sustained attention to tasks for 20 minute intervals with Min A verbal cues for redirection. Pt left sitting in wheelchiar with alarm set and needs within reach. Continue per current plan of care.        Pain Pain Assessment Pain Scale: 0-10 Pain Score: 0-No pain  Therapy/Group: Individual Therapy  Arbutus Leas 10/27/2019, 3:01 PM

## 2019-10-27 NOTE — Progress Notes (Addendum)
Physical Therapy Session Note  Patient Details  Name: [name] [name] MRN: [id number] Date of Birth: 1945-09-30  Today's Date: 10/27/2019 PT Individual Time: 0801-0900 PT Individual Time Calculation (min): 59 min   Short Term Goals: Week 1:  PT Short Term Goal 1 (Week 1): Patient will perform bed mobility with supervision. PT Short Term Goal 2 (Week 1): Patient will perform basic transfer with CGA using LRAD. PT Short Term Goal 3 (Week 1): Patient will ambulate >50 ft using LRAD. PT Short Term Goal 4 (Week 1): Patient will initiate stair training.  Skilled Therapeutic Interventions/Progress Updates:    Patient supine in bed upon PT arrival, agreeable to therapy tx, denies pain. MD in/out for morning assessment. Therapist donned B TEDs, shoes totalA and pants maxA for LE threading, pt utilizing bridging and UE to complete clothing management. Pt performed x 10 bridges with minA to prevent excessive hip abduction and cues for technique and hold times.  Supine > sitting EOB with minA for trunk upright and cues for sequencing and technique with hand placement/push off. Squat pivot transfer > w/c with modA. Pt transported to rehab gym for time management and energy conservation. Squat pivot transfer to mat table with modA and max cueing for sequencing and therapist demonstration. While sitting EOM, pt reached laterally for bean bags placed on either side and then anteriorly reached to place bean bag in bucket to facilitate forward trunk lean as pt tends to LOB posteriorly and sits with posterior pelvic tilt. Mini sit <> stand for hip clearance only to allow therapist to place dynadisc under pt with minA from elevated mat and use of BUE for push off support. Pt repeated bean bag exercise while sitting on dynadisc and in high perched sitting posture. Pt performed lateral scooting along mat R/L x 1 the length of the mat each direction. Sit <> stand x 3  in front of mat without AD with min-mod + B HHA, therapist  providing intermittent minA to steady but allowed pt to feel weight displacement and self-correct posture, cues for upright posture. Squat pivot to w/c with modA and cues for sequencing and hand placement. Pt transported back to room in w/c, left seated in w/c with needs in reach and chair alarm set.   Therapy Documentation Precautions:  Precautions Precautions: Fall Restrictions Weight Bearing Restrictions: No    Therapy/Group: Individual Therapy  Juliann Pulse SPT 10/27/2019, 7:29 AM

## 2019-10-27 NOTE — Progress Notes (Signed)
[place] PHYSICAL MEDICINE & REHABILITATION PROGRESS NOTE   Subjective/Complaints:   No issues overnite , going to PT gym this am , denies pains  ROS- neg CP, SOB, N/V/D   Objective:   DG CHEST PORT 1 VIEW  Result Date: 10/25/2019 CLINICAL DATA:  Shortness of breath. 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 Right PICC tip near the superior cavoatrial junction without significant change. Stable enlarged cardiac silhouette. Clear right lung. Moderate-sized left pleural effusion and left basilar airspace opacity with little change. No acute bony abnormality. IMPRESSION: 1. Stable moderate-sized left pleural effusion and left basilar atelectasis or pneumonia. 2. Stable cardiomegaly. Electronically Signed   By: Claudie Revering M.D.   On: 10/25/2019 10:49   Recent Labs    10/25/19 0938 10/26/19 0528  WBC 10.2 9.0  HGB 10.2* 10.1*  HCT 31.4* 30.8*  PLT 444* 402*   Recent Labs    10/25/19 0938 10/26/19 0528  NA 138 137  K 4.0 3.7  CL 106 105  CO2 22 22  GLUCOSE 177* 121*  BUN 12 10  CREATININE 1.04 0.93  CALCIUM 8.7* 8.4*    Intake/Output Summary (Last 24 hours) at 10/27/2019 0820 Last data filed at 10/26/2019 2055 Gross per 24 hour  Intake 460 ml  Output 200 ml  Net 260 ml     Physical Exam: Vital Signs Blood pressure 131/75, pulse 70, temperature 98 F (36.7 C), resp. rate 18, height 5\' 10"  (1.778 m), weight 82.3 kg, SpO2 95 %.   General: No acute distress Mood and affect are appropriate Heart: Regular rate and rhythm no rubs murmurs or extra sounds Lungs: Clear to auscultation, breathing unlabored, no rales or wheezes Abdomen: Positive bowel sounds, soft nontender to palpation, nondistended Extremities: No clubbing, cyanosis, or edema Skin: No evidence of breakdown, no evidence of rash Neurologic: Cranial nerves II through XII intact, motor strength is 4/5 bilateral Delt, Bi, tri,  grip, 4- R HF, 4/5 Left HF, 4/5 bilateral KE and ADF Sensory exam normal sensation to light touch and proprioception in bilateral upper and lower extremities Cerebellar exam normal finger to nose to finger as well as heel to shin in bilateral upper and lower extremities Musculoskeletal: Full range of motion in all 4 extremities. No joint swelling   Assessment/Plan: 1. 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  Physiatrist is providing close team supervision and 24 hour management of active medical problems listed below.  Physiatrist and rehab team continue to assess barriers to discharge/monitor patient progress toward functional and medical goals  Care Tool:  Bathing    Body parts bathed by patient: Right arm, Left arm, Chest, Abdomen, Front perineal area, Right upper leg, Left upper leg, Face, Right lower leg, Left lower leg   Body parts bathed by helper: Buttocks     Bathing assist Assist Level: Moderate Assistance - Patient 50 - 74%     Upper Body Dressing/Undressing Upper body dressing   What is the patient wearing?: Hospital gown only    Upper body assist Assist Level: Moderate Assistance - Patient 50 - 74%    Lower Body Dressing/Undressing Lower body dressing      What is the patient wearing?: Incontinence brief     Lower body assist Assist for  lower body dressing: Maximal Assistance - Patient 25 - 49%     Toileting Toileting    Toileting assist Assist for toileting: Minimal Assistance - Patient > 75% Assistive Device Comment: Urinal   Transfers Chair/bed transfer  Transfers assist     Chair/bed transfer assist level: Moderate Assistance - Patient 50 - 74%     Locomotion Ambulation   Ambulation assist      Assist level:  Moderate Assistance - Patient 50 - 74% Assistive device: No Device Max distance: 4'   Walk 10 feet activity   Assist  Walk 10 feet activity did not occur: Safety/medical concerns(decreased balance/activity tolerance)        Walk 50 feet activity   Assist Walk 50 feet with 2 turns activity did not occur: Safety/medical concerns(decreased balance/activity tolerance)         Walk 150 feet activity   Assist Walk 150 feet activity did not occur: Safety/medical concerns(decreased balance/activity tolerance)         Walk 10 feet on uneven surface  activity   Assist Walk 10 feet on uneven surfaces activity did not occur: Safety/medical concerns(decreased balance/activity tolerance)         Wheelchair     Assist   Type of Wheelchair: Manual    Wheelchair assist level: Minimal Assistance - Patient > 75% Max wheelchair distance: 14'    Wheelchair 50 feet with 2 turns activity    Assist        Assist Level: Minimal Assistance - Patient > 75%   Wheelchair 150 feet activity     Assist      Assist Level: Maximal Assistance - Patient 25 - 49%   Blood pressure 131/75, pulse 70, temperature 98 F (36.7 C), resp. rate 18, height 5\' 10"  (1.778 m), weight 82.3 kg, SpO2 95 %.  Medical Problem List and Plan: 1.  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  Antithrombotics: -DVT/anticoagulation/pulmonary emboli: Eliquis             -antiplatelet therapy: N/A 3. Pain Management: Neurontin 300 mg nightly, Lidoderm patch. Well controlled.  4. Mood: Provide emotional support             -antipsychotic agents: N/A 5. Neuropsych: This patient is capable of making decisions on his own behalf. 6. Skin/Wound Care: Routine skin checks 7.  Fluids/Electrolytes/Nutrition: Routine in and outs with follow-up chemistries 8.  Acute blood loss anemia.  Hgb stable on 3/7 9.  Hypertension.  Norvasc 10 mg daily, Coreg 25 mg twice daily, hydralazine as directed. Well controlled.  Vitals:   10/26/19 2356 10/27/19 0515  BP: 127/74 131/75  Pulse: 67 70  Resp:  18  Temp:  98 F (36.7 C)  SpO2:  95%  controlled on current regimen  10.  BPH.  Flomax 11.  Diabetes mellitus.  Hemoglobin A1c 6.7.  SSI.  Patient on Glucophage 500 mg twice daily prior to admission.  Resume as needed CBG (last 3)  Recent Labs    10/26/19 1651 10/26/19 2059 10/27/19 0632  GLUCAP 131* 144* 110*  Controlled 3/9 12.  Hyperlipidemia.  Lipitor 13. Constipation:   3/7: ordered enema for this morning.  14. Pleural  effusions:stable sized  15. Disposition: Lives with wife who is at home and will be able to help him. Has 3 daughters whom he is very close with who also live nearby.      LOS: 3 days A FACE TO FACE EVALUATION WAS PERFORMED  Charlett Blake 10/27/2019, 8:20 AM

## 2019-10-27 NOTE — Progress Notes (Signed)
Occupational Therapy Session Note  Patient Details  Name: Dane Mihalick Erxleben MRN: [id number] Date of Birth: 06-24-1946  Today's Date: 10/27/2019 OT Individual Time: 1300-1335 OT Individual Time Calculation (min): 35 min    Short Term Goals: Week 1:  OT Short Term Goal 1 (Week 1): Pt will be able to sit to stand to RW with CGA. OT Short Term Goal 2 (Week 1): Pt will be able to ambulate to bathroom with CGA. OT Short Term Goal 3 (Week 1): Pt will be able to toilet with min A. OT Short Term Goal 4 (Week 1): Pt will be able to don pants with min A. OT Short Term Goal 5 (Week 1): Pt will demonstrate improved postural control to sit EOB with S only.  Skilled Therapeutic Interventions/Progress Updates:    Pt worked on visual scanning exercises during session.  With fixed gait, he demonstrates lateral deviation in the left eye with right eye maintaining midline.  He reports diplopia with scanning in all directions in both near and far vision.  If the right eye is covered, the left eye will deviate to midline to maintain fixed gaze on target and will follow object in all directions.  Convergence is also limited in the left eye as well.  Place patch over the right eye for work on visual scanning in all directions including converging to midline.  Discussed use of patch as needed for watching TV or completing functional tasks if the diplopia is bothering him.  Pt reports wearing glasses being more comfortable than wearing the patch at this time.  Will continue to incorporate patch in treatment sessions for scanning as needed.  Pt's spouse in during session as well.  Discussed current functional level and goal anticipation as well as expected LOS.  She will be able to provide 24 hr assist at initial discharge.    Therapy Documentation Precautions:  Precautions Precautions: Fall Restrictions Weight Bearing Restrictions: No  Pain: Pain Assessment Pain Scale: Faces Pain Score: 0-No pain   Therapy/Group:  Individual Therapy  Rigoberto Repass,Joneric OTR/L 10/27/2019, 4:10 PM

## 2019-10-27 NOTE — Plan of Care (Signed)
  Problem: Consults Goal: RH STROKE PATIENT EDUCATION Description: See Patient Education module for education specifics  Outcome: Progressing Goal: Diabetes Guidelines if Diabetic/Glucose > 140 Description: If diabetic or lab glucose is > 140 mg/dl - Initiate Diabetes/Hyperglycemia Guidelines & Document Interventions  Outcome: Progressing   Problem: RH BLADDER ELIMINATION Goal: RH STG MANAGE BLADDER WITH ASSISTANCE Description: STG Manage Bladder With min Assistance Outcome: Progressing   Problem: RH SKIN INTEGRITY Goal: RH STG SKIN FREE OF INFECTION/BREAKDOWN Outcome: Progressing Goal: RH STG MAINTAIN SKIN INTEGRITY WITH ASSISTANCE Description: STG Maintain Skin Integrity With min Assistance. Outcome: Progressing Goal: RH STG ABLE TO PERFORM INCISION/WOUND CARE W/ASSISTANCE Description: STG Able To Perform Incision/Wound Care With min Assistance. Outcome: Progressing   Problem: RH SAFETY Goal: RH STG ADHERE TO SAFETY PRECAUTIONS W/ASSISTANCE/DEVICE Description: STG Adhere to Safety Precautions With mod I Assistance/Device. Outcome: Progressing   Problem: RH KNOWLEDGE DEFICIT Goal: RH STG INCREASE KNOWLEDGE OF DIABETES Outcome: Progressing Goal: RH STG INCREASE KNOWLEDGE OF HYPERTENSION Outcome: Progressing Goal: RH STG INCREASE KNOWLEGDE OF HYPERLIPIDEMIA Outcome: Progressing Goal: RH STG INCREASE KNOWLEDGE OF STROKE PROPHYLAXIS Outcome: Progressing

## 2019-10-27 NOTE — Progress Notes (Signed)
Occupational Therapy Session Note  Patient Details  Name: [name] [name] MRN: [id number] Date of Birth: Feb 21, 1946  Today's Date: 10/27/2019 OT Individual Time: 1003-1101 OT Individual Time Calculation (min): 58 min    Short Term Goals: Week 1:  OT Short Term Goal 1 (Week 1): Pt will be able to sit to stand to RW with CGA. OT Short Term Goal 2 (Week 1): Pt will be able to ambulate to bathroom with CGA. OT Short Term Goal 3 (Week 1): Pt will be able to toilet with min A. OT Short Term Goal 4 (Week 1): Pt will be able to don pants with min A. OT Short Term Goal 5 (Week 1): Pt will demonstrate improved postural control to sit EOB with S only.  Skilled Therapeutic Interventions/Progress Updates:    Pt completed shower and dressing during session.  He was able to transfer from supine to sit EOB with min assist.  Increased posterior LOB when sitting on the EOB.  He transferred stand pivot throughout session with overall mod assist to the wheelchair as well as to the tub bench and back.  Mod assist for all sit to stand with LB bathing tasks as well as when pulling garments over his hips.  He was able to sequence through bathing with only min instructional cueing in the shower.  Mod instructional cueing was needed for drying off.  He was able to donn a pullover shirt with min assist, placing the correct arms in the arm holes as well.  He needed mod assist for threading the brief and his pants over his feet.  Max assist for donning TEDs with mod assist for slip on shoes.  He was oriented X3 during session as well.  Pt voiced difficulty understanding what had happened to him and why it happened.  He also stated at times he feels like he wants to cry.  I told him this was normal to have feelings like this and that he was progressing well and will definitely get better.  Feel neuropsych eval would be beneficial for coping at this time.  Pt left with call button and phone in reach with safety belt in place.     Therapy Documentation Precautions:  Precautions Precautions: Fall Restrictions Weight Bearing Restrictions: No   Pain: Pain Assessment Pain Scale: Faces Pain Score: 0-No pain ADL: See Care Tool Section for some details of mobility and selfcare  Therapy/Group: Individual Therapy  Zakaree Mcclenahan,Shakil OTR/L 10/27/2019, 11:40 AM

## 2019-10-28 ENCOUNTER — Inpatient Hospital Stay (HOSPITAL_COMMUNITY): Payer: Medicare Other

## 2019-10-28 ENCOUNTER — Inpatient Hospital Stay (HOSPITAL_COMMUNITY): Payer: Medicare Other | Admitting: Occupational Therapy

## 2019-10-28 ENCOUNTER — Inpatient Hospital Stay (HOSPITAL_COMMUNITY): Payer: Medicare Other | Admitting: Speech Pathology

## 2019-10-28 LAB — GLUCOSE, CAPILLARY
Glucose-Capillary: 126 mg/dL — ABNORMAL HIGH (ref 70–99)
Glucose-Capillary: 186 mg/dL — ABNORMAL HIGH (ref 70–99)
Glucose-Capillary: 129 mg/dL — ABNORMAL HIGH (ref 70–99)
Glucose-Capillary: 125 mg/dL — ABNORMAL HIGH (ref 70–99)

## 2019-10-28 NOTE — Progress Notes (Signed)
Occupational Therapy Session Note  Patient Details  Name: Vishnu Jesberger Knarr MRN: [id number] Date of Birth: 12-May-1946  Today's Date: 10/28/2019 OT Individual Time: 1300-1404 OT Individual Time Calculation (min): 64 min    Short Term Goals: Week 1:  OT Short Term Goal 1 (Week 1): Pt will be able to sit to stand to RW with CGA. OT Short Term Goal 2 (Week 1): Pt will be able to ambulate to bathroom with CGA. OT Short Term Goal 3 (Week 1): Pt will be able to toilet with min A. OT Short Term Goal 4 (Week 1): Pt will be able to don pants with min A. OT Short Term Goal 5 (Week 1): Pt will demonstrate improved postural control to sit EOB with S only.  Skilled Therapeutic Interventions/Progress Updates:    Pt completed grooming tasks of shaving and washing his face.  He was able to complete task with overall min assist in sitting for thoroughness, using a regular safety razor.  Increased time needed for processing and sequencing task.  He was able to state the date and day of the week when asked.  After completion of shaving, he was able to complete donning pullover shirt with min assist for pulling it down in the back.  Finished session with work on standing balance with use of the RW for support.  He was able to maintain static standing balance with min assist while dividing his attention to order his dinner and breakfast for the next day.  Mod demonstrational cueing needed for maintaining upright hip extension as well trunk extension for two minutes before he fatigued and needed a rest break.  Finished session with pt in the wheelchair and pt's spouse in room as well.  Call button and phone in reach with safety belt in place.    Therapy Documentation Precautions:  Precautions Precautions: Fall Restrictions Weight Bearing Restrictions: No  Pain: Pain Assessment Pain Scale: Faces Pain Score: 0-No pain ADL: See Care Tool Section for some details of mobility and selfcare  Therapy/Group:  Individual Therapy  Letitia Sabala,Donaciano OTR/L 10/28/2019, 4:02 PM

## 2019-10-28 NOTE — Patient Care Conference (Signed)
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 History of repair of dissecting thoracic aneurysm   . Abdominal distension   . Pulmonary embolus ([place]) 10/19/2019  . CVA (cerebral vascular accident) ([place]) 10/19/2019  . AKI (acute kidney injury) ([place]) 10/19/2019  . 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 Ileus ([place]) 10/19/2019  . 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 Radiculopathy 12/15/2014    Expected Discharge Date: Expected Discharge Date: 11/13/19  Team Members Present: Physician leading conference: Dr. Alysia Penna Care Coodinator Present: Karene Fry, RN, MSN;Deborah Tobin Chad, RN, BSN, CRRN Nurse Present: Judee Clara, LPN PT Present: Michaelene Song, PT OT Present: Clyda Greener, OT SLP Present: Jettie Booze, CF-SLP PPS Coordinator present : Gunnar Fusi, SLP     Current Status/Progress Goal Weekly Team Focus  Bowel/Bladder   Patient is continent of bladder most times but has incontinent episodes. Last BM 3/6, PRN sorbitol to be given.  Continent bowel and bladder, consistent BMs.  Have BM, give medications as needed. Encourage voiding often to promote continence.   Swallow/Nutrition/ Hydration             ADL's   Supervision for UB bathing with min to supervision for UB dressing.   Mod assist for LB bathing and dressing as well as transfers.  Ataxic movement in UEs.  Diplopia also present in all fields  supervision overall  selfcare retraining, balance retraining, transfer training, neuromuscular re-education, pt/family education, visual exercises   Mobility   min A bed mobility, mod A transfers with and without RW, Mod A gait 15 ft limited by SOB/fatigue, unable to attempt stairs on eval due to SOB/fatigue  Independent bed mobility, supervision transfers, gait with LRAD  sitting balance, standing balance, squat pivot transfer without device, gait   Communication             Safety/Cognition/ Behavioral Observations  Mod-Max A basic problem solving, emergent awareness, Min-Mod A sustained attention and recall  Supervision A  basic functional problem solving, error awareness, recall with aids/strategies, sustained attention   Pain   Patient reporting no pain, lidoderm patch to his back.  No pain.  Monitor for pain, PRN medications as needed.   Skin   Foam to L upper extremity, some redness to groin, no other skin issues.  No further skin breakdown  Monitor skin, prevent breakdown by promoting activity.    Rehab Goals Patient on target to meet rehab goals: Yes *See Care Plan and progress notes for long and short-term goals.     Barriers to Discharge  Current Status/Progress Possible Resolutions Date Resolved   Nursing                  PT  Home environment access/layout  Patient has 2 level home, can stay on first floor,  but patient's bedroom and bathroom are on the second floor              Carrier with wife, limited access to B+B            Discharge Planning/Teaching Needs:  Home with family  TBD   Team Discussion: 74 yo with discectomy/fusion 2016, chronic low back pain, small B infarcts, on Eliquis, h/o GI bleed, Hgb trending up, may get neuropsych consult.  RN cont/inc at night, BM  3/6, given sorbitol today, refusing Bipap.  OT S UB B, min A UB D, LB B/D mod A, transfers mod A, min/mod sitting balance, double vision, goals S, downgrade goals to min/minguard A.  PT min bed, min/mod transfers, fatigues, max cuing, mod gait 15;, goals S/CGA.  SLP delayed processing, mod/max basic prob solving, min/mod ST recall, goals S.   Revisions to Treatment Plan: N/A     Medical Summary Current Status: slow cognitive processing , very poor endurance, BPs are well controlled, fairly appropriate , emotional labile Weekly Focus/Goal: improve endurance  Barriers to Discharge: Medical stability   Possible Resolutions to Barriers: cont rehab   Continued Need for Acute Rehabilitation Level of Care: The patient requires daily medical management by a physician with specialized training in physical medicine and rehabilitation for the following reasons: Direction of a multidisciplinary physical rehabilitation program to maximize functional independence : Yes Medical management of patient stability for increased activity during participation in an intensive rehabilitation regime.: Yes Analysis of laboratory values and/or radiology reports with any subsequent need for medication adjustment and/or medical intervention. : Yes   I attest that I was present, lead the team conference, and concur with the assessment and plan of the team.   Jodell Cipro M 10/28/2019, 5:06 PM   Team conference was held via web/ teleconference due to [place] - 19

## 2019-10-28 NOTE — Care Management (Signed)
Inpatient [facility] Statement of Services  Patient Name:  [name] [name]  Date:  10/28/2019  Welcome to the [facility].  Our goal is to provide you with an individualized program based on your diagnosis and situation, designed to meet your specific needs.  With this comprehensive rehabilitation program, you will be expected to participate in at least 3 hours of rehabilitation therapies Monday-Friday, with modified therapy programming on the weekends.  Your rehabilitation program will include the following services:  Physical Therapy (PT), Occupational Therapy (OT), Speech Therapy (ST), 24 hour per day rehabilitation nursing, Therapeutic Recreaction (TR), Neuropsychology, Case Management (Social Worker), Rehabilitation Medicine, Nutrition Services and Pharmacy Services  Weekly team conferences will be held on Wednesdays to discuss your progress.  Your Social Industrial/product designer will talk with you frequently to get your input and to update you on team discussions.  Team conferences with you and your family in attendance may also be held.  Expected length of stay: 14-18 days Overall anticipated outcome: Supervision  Depending on your progress and recovery, your program may change. Your Social Industrial/product designer will coordinate services and will keep you informed of any changes. Your Social Worker's/Care Manager's name and contact numbers are listed  below.  The following services may also be recommended but are not provided by the [facility]:   [place] will be made to provide these services after discharge if needed.  Arrangements include referral to agencies that provide these services.  Your insurance has been verified to be:  [facility] Medicare Your primary doctor is:  Wenda Low, MD  Pertinent information will be shared with your doctor and your  insurance company.  Care Manager: [name], RN [phone] or [phone] Social Worker:  Loralee Pacas, [place] or ([phone]  Information discussed with and copy given to patient by: Margarito Liner, 10/28/2019, 6:06 PM

## 2019-10-28 NOTE — Progress Notes (Signed)
[facility] PHYSICAL MEDICINE & REHABILITATION PROGRESS NOTE   Subjective/Complaints:   Constipated per RN but no c/os, sorbitol this am   ROS- neg CP, SOB, N/V/D   Objective:   No results found. Recent Labs    10/26/19 0528  WBC 9.0  HGB 10.1*  HCT 30.8*  PLT 402*   Recent Labs    10/26/19 0528  NA 137  K 3.7  CL 105  CO2 22  GLUCOSE 121*  BUN 10  CREATININE 0.93  CALCIUM 8.4*    Intake/Output Summary (Last 24 hours) at 10/28/2019 1056 Last data filed at 10/28/2019 0820 Gross per 24 hour  Intake 350 ml  Output --  Net 350 ml     Physical Exam: Vital Signs Blood pressure 135/72, pulse 65, temperature 98.1 F (36.7 C), resp. rate 18, height _0  (1.778 m), weight 84.3 kg, SpO2 100 %.   General: No acute distress Mood and affect are appropriate Heart: Regular rate and rhythm no rubs murmurs or extra sounds Lungs: Clear to auscultation, breathing unlabored, no rales or wheezes Abdomen: Positive bowel sounds, soft nontender to palpation, nondistended Extremities: No clubbing, cyanosis, or edema Skin: No evidence of breakdown, no evidence of rash Neurologic: Cranial nerves II through XII intact, motor strength is 4/5 bilateral Delt, Bi, tri, grip, 4- R HF, 4/5 Left HF, 4/5 bilateral KE and ADF Sensory exam normal sensation to light touch and proprioception in bilateral upper and lower extremities Cerebellar exam normal finger to nose to finger as well as heel to shin in bilateral upper and lower extremities Musculoskeletal: Full range of motion in all 4 extremities. No joint swelling   Assessment/Plan: 1. 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  Physiatrist is providing close team supervision and 24 hour management of active medical  problems listed below.  Physiatrist and rehab team continue to assess barriers to discharge/monitor patient progress toward functional and medical goals  Care Tool:  Bathing    Body parts bathed by patient: Right arm, Left arm, Chest, Abdomen, Front perineal area, Right upper leg, Left upper leg, Face, Right lower leg, Left lower leg   Body parts bathed by helper: Buttocks     Bathing assist Assist Level: Moderate Assistance - Patient 50 - 74%     Upper Body Dressing/Undressing Upper body dressing   What is the patient wearing?: Pull over shirt    Upper body assist Assist Level: Minimal Assistance - Patient > 75%    Lower Body Dressing/Undressing Lower body dressing      What is the patient wearing?: Incontinence brief, Pants     Lower body assist Assist for lower body dressing: Moderate Assistance - Patient 50 - 74%     Toileting Toileting    Toileting assist Assist for toileting: Minimal Assistance - Patient > 75% Assistive Device Comment: Urinal   Transfers Chair/bed transfer  Transfers assist     Chair/bed transfer assist level: Moderate Assistance - Patient 50 - 74%     Locomotion Ambulation   Ambulation assist      Assist level: Moderate Assistance - Patient 50 - 74% Assistive device: No Device Max distance: 4'   Walk 10 feet activity   Assist  Walk 10 feet activity did not occur: Safety/medical concerns(decreased balance/activity tolerance)  Walk 50 feet activity   Assist Walk 50 feet with 2 turns activity did not occur: Safety/medical concerns(decreased balance/activity tolerance)         Walk 150 feet activity   Assist Walk 150 feet activity did not occur: Safety/medical concerns(decreased balance/activity tolerance)         Walk 10 feet on uneven surface  activity   Assist Walk 10 feet on uneven surfaces activity did not occur: Safety/medical concerns(decreased balance/activity tolerance)          Wheelchair     Assist   Type of Wheelchair: Manual    Wheelchair assist level: Minimal Assistance - Patient > 75% Max wheelchair distance: 14'    Wheelchair 50 feet with 2 turns activity    Assist        Assist Level: Minimal Assistance - Patient > 75%   Wheelchair 150 feet activity     Assist      Assist Level: Maximal Assistance - Patient 25 - 49%   Blood pressure 135/72, pulse 65, temperature 98.1 F (36.7 C), resp. rate 18, height _0  (1.778 m), weight 84.3 kg, SpO2 100 %.  Medical Problem List and Plan: 1.  Third mental status secondary to small acute bilateral cerebral and cerebellar infarcts embolic status post thoracic endograft and for acute type B aortic dissection             -patient may shower             -ELOS/Goals: 10-14 days  Team conference today please see physician documentation under team conference tab, met with team  to discuss problems,progress, and goals. Formulized individual treatment plan based on medical history, underlying problem and comorbidities.  2.  Antithrombotics: -DVT/anticoagulation/pulmonary emboli: Eliquis             -antiplatelet therapy: N/A 3. Pain Management: Neurontin 300 mg nightly, Lidoderm patch. Well controlled.  Lumbar post lami syndrome 4. Mood: Provide emotional support             -antipsychotic agents: N/A 5. Neuropsych: This patient is capable of making decisions on his own behalf. 6. Skin/Wound Care: Routine skin checks 7. Fluids/Electrolytes/Nutrition: Routine in and outs with follow-up chemistries 8.  Acute blood loss anemia.  Hgb stable on 3/7 9.  Hypertension.  Norvasc 10 mg daily, Coreg 25 mg twice daily, hydralazine as directed. Well controlled.  Vitals:   10/28/19 0101 10/28/19 0338  BP: 131/83 135/72  Pulse: 67 65  Resp:  18  Temp:  98.1 F (36.7 C)  SpO2:  100%  controlled on current regimen 3/10 10.  BPH.  Flomax 11.  Diabetes mellitus.  Hemoglobin A1c 6.7.  SSI.  Patient on  Glucophage 500 mg twice daily prior to admission.  Resume as needed CBG (last 3)  Recent Labs    10/27/19 1631 10/27/19 2144 10/28/19 0636  GLUCAP 175* 105* 126*  Controlled 3/10 12.  Hyperlipidemia.  Lipitor 13. Constipation:   3/7: ordered enema for this morning.  14. Pleural effusions:stable sized  15. Disposition: Lives with wife who is at home and will be able to help him. Has 3 daughters whom he is very close with who also live nearby.      LOS: 4 days A FACE TO [place] Tonjia Parillo 10/28/2019, 10:56 AM

## 2019-10-28 NOTE — Care Management (Signed)
Patient Details  Name: [name] [name] MRN: [id number] Date of Birth: 02/23/1946  Today's Date: 10/28/2019  Problem List:  Patient Active Problem List   Diagnosis Date Noted  . 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 History of repair of dissecting thoracic aneurysm   . Abdominal distension   . Pulmonary embolus ([place]) 10/19/2019  . CVA (cerebral vascular accident) ([place]) 10/19/2019  . AKI (acute kidney injury) ([place]) 10/19/2019  . 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 Ileus ([place]) 10/19/2019  . 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 Radiculopathy 12/15/2014   Past Medical History:  Past Medical History:  Diagnosis Date  . Chronic back pain    stenosis  . Enlarged prostate   . GERD (gastroesophageal reflux disease)    occasionally will take a zantac(maybe once a month)  . History of colon polyps   . History of kidney stones   . History of stress test    done 10 yrs. ago, as a baseline   . Hyperlipidemia    takes Crestor daily  . Hypertension    takes Amlodipine daily and Lotensin as well   Past Surgical History:  Past Surgical History:  Procedure Laterality Date  . ANTERIOR LAT LUMBAR FUSION Left 12/15/2014   Procedure: ANTERIOR LATERAL LUMBAR FUSION 1 LEVEL;  Surgeon: Phylliss Bob, MD;  Location: [facility];  Service: Orthopedics;  Laterality: Left;  Left sided lateral lumbar interbody fusion, lumbar 3-4, posterior spinal fusion, lumbar 3-4 with instrumentation.  . COLONOSCOPY    . ESOPHAGOGASTRODUODENOSCOPY    . fatty tissue removed from stomach    . LUMBAR LAMINECTOMY/DECOMPRESSION MICRODISCECTOMY N/A 07/08/2013   Procedure: LUMBAR LAMINECTOMY/DECOMPRESSION MICRODISCECTOMY;  Surgeon: Sinclair Ship, MD;  Location: [facility];  Service: Orthopedics;  Laterality: N/A;  Lumbar 3-4, lumbar 4-5  decompression  . RADIOLOGY WITH ANESTHESIA N/A 10/22/2019   Procedure: MRI WITH ANESTHESIA OF LUMBAR SPINE WITHOUT CONTRAST;  Surgeon: Radiologist, Medication, MD;  Location: [facility];  Service: Radiology;  Laterality: N/A;  . right ankle surgery     as child   . THORACIC AORTIC ENDOVASCULAR STENT GRAFT N/A 10/15/2019   Procedure: THORACIC AORTIC ENDOVASCULAR STENT GRAFT;  Surgeon: Waynetta Sandy, MD;  Location: [facility];  Service: Vascular;  Laterality: N/A;  . TONSILLECTOMY     as a child   Social History:  reports that he has quit smoking. His smoking use included cigarettes. He has a 10.00 pack-year smoking history. He has never used smokeless tobacco. He reports that he does not drink alcohol or use drugs.  Family / Support Systems Marital Status: Married Patient Roles: Spouse Spouse/Significant Other: Psychologist, forensic Other Supports: Children Anticipated Caregiver: wife Barbaraann Share Ability/Limitations of Caregiver: Min A Caregiver Availability: 24/7  Social History Preferred language: English Religion: Jewish Read: Yes Write: Yes Employment Status: Retired Date Retired/Disabled/Unemployed: Teacher, music   Abuse/Neglect Abuse/Neglect Assessment Can Be Completed: Yes Physical Abuse: Denies Verbal Abuse: Denies Sexual Abuse: Denies Exploitation of patient/patient's resources: Denies Self-Neglect: Denies  Emotional Status Pt's affect, behavior and adjustment status: Flat affect, slow response, cognitively appropriate and normal mood; staff report emotional earlier in stay Recent Psychosocial Issues: Down, emotional labile  Patient / Family Perceptions, Expectations & Goals Pt/Family understanding of illness & functional limitations: Patient's wife has a fair understanding of current health status and functional limitations. Patient does not feel he is doing  as well as others report Premorbid pt/family roles/activities: Independent and "did his own thing" Anticipated changes in  roles/activities/participation: Will need supervision assistance for a while at discharge Pt/family expectations/goals: Patient would like to be able to do as much for himself as possible. Wife would like for him to be able to do self care activities himself  [employer] available at discharge: Wife wil provide transportation at discharge Resource referrals recommended: Neuropsychology  Discharge Planning Living Arrangements: Spouse/significant other Support Systems: Spouse/significant other Type of Residence: Private residence Insurance Resources: [employer] Money Management: Spouse Does the patient have any problems obtaining your medications?: No Home Management: Wife will manage the home at discharge Patient/Family Preliminary Plans: Discharge home with wife and extended family will assist as needed [place] Barriers to Discharge: Home environment access/layout, Decreased caregiver support [place] Barriers to Discharge Comments: Home with wife, limited access to B+B Social Work Anticipated Follow Up Needs: HH/OP Expected length of stay: 14-18 days  Clinical Impression Pleasant gentleman, slow responding however appropriate conversation. Wife at bedside and able to share information the patient was not able to communicate. Patient noted with blunted affect and acknowledges some difficulty coming to terns with change in independence. Reported he was independent with most things PTA, however now he is not able to do things like playing the piano or guitar. Wife is very supportive and encouraging. Noted patient is doing better today than when he came in and staff expect he will need some supervision at discharge. He reported no questions and is agreeable to a discharge on 11/13/19.  [name] B 10/28/2019, 6:09 PM

## 2019-10-28 NOTE — Progress Notes (Signed)
Speech Language Pathology Daily Session Note  Patient Details  Name: [name] [name] MRN: [id number] Date of Birth: 02/01/1946  Today's Date: 10/28/2019 SLP Individual Time: 1440-1535 SLP Individual Time Calculation (min): 55 min  Short Term Goals: Week 1: SLP Short Term Goal 1 (Week 1): Pt will utilize external aids to recall mildly complex daily information with min assist verbal cues. SLP Short Term Goal 2 (Week 1): Pt will complete mildly complex functional tasks with min assist for functional problem solving. SLP Short Term Goal 3 (Week 1): Pt will recognize and correct errors during mildly complex functional tasks with min assist verbal cues. SLP Short Term Goal 4 (Week 1): Pt will sustain his attention to functional tasks for 10 minute intervals with min verbal cues for redirection.  Skilled Therapeutic Interventions: Pt was seen for skilled ST intervention targeting aforementioned goals. Pt was pleasant and cooperative with unfamiliar therapist, but affect was largely flat. Pattern of speech was halting. Wife present. SLP facilitated session by engaging pt in conversation about current rehab stay, focus of therapies, and goals for improvement to increase awareness of deficits. Pt able to recall general activities from PT (walking), OT (shaving), and ST (recall), but required cues for additional details. Pt struggled with maneuvering a simple maze, requiring mod verbal cues to problem solve. Clock drawing task was significantly impaired - pt wrote only the numbers 1,3, and 9 (incorrect placement), and was unable to set hands at a specific time. Pt maintained attention to task in quiet environment for 20 minute intervals. No cues required to redirect attention to task.  Pt was left in wheelchair with alarm on, all needs within reach. Continue ST per current plan of care.  Pain Pain Assessment Pain Scale: Faces Pain Score: 0-No pain  Therapy/Group: Individual Therapy   Bronnie Vasseur B. Quentin Ore,  [facility], CCC-SLP Speech Language Pathologist  Shonna Chock 10/28/2019, 4:21 PM

## 2019-10-28 NOTE — Progress Notes (Addendum)
Physical Therapy Session Note  Patient Details  Name: [name] [name] MRN: [id number] Date of Birth: June 25, 1946  Today's Date: 10/28/2019 PT Individual Time: 0915-1028 PT Individual Time Calculation (min): 73 min    Short Term Goals: Week 1:  PT Short Term Goal 1 (Week 1): Patient will perform bed mobility with supervision. PT Short Term Goal 2 (Week 1): Patient will perform basic transfer with CGA using LRAD. PT Short Term Goal 3 (Week 1): Patient will ambulate >50 ft using LRAD. PT Short Term Goal 4 (Week 1): Patient will initiate stair training.  Skilled Therapeutic Interventions/Progress Updates:    Patient supine in bed upon PT arrival, agreeable to therapy tx, denies pain. Therapist donned B TEDs and socks while supine totalA. Pt rolled R with supervision cues for using bed features, sidelying > sitting EOB with minA for trunk upright and modA to maintain sitting balance. Therapist began to donn pants with maxA for LE threading while pt seated EOB > standing at RW with min-modA to allow pt to participate in clothing management. Tornado test drill went off during session. At this time, pt performed stand pivot with RW > w/c with minA and max cueing for sequencing. Therapist transported pt out in the hallway to participate in drill. While sitting in the hallway, pt scooted to edge of w/c seat and participated in back unsupported reaching with each UE to target determined by therapist at various heights and distances. Once drill complete, pt performed reciprocal hip scooting back into w/c and was transported to rehab gym for time management and energy conservation. Squat pivot transfer form w/c > mat with minA for moving hips and max cueing for supervision. Pt participated in high perched sitting EOM with CGA-minA to maintain sitting balance, performed reaching laterally with each UE for lego blocks and then reaching anteriorly to build lego blocks into tower on table. MD in/out for morning  assessment. Pt reported having LBP after sitting for a prolonged period of time with back unsupported > squat pivot transfer to w/c with minA and max cueing for sequencing to allow for supported seated rest break. Sit > stand in front of mat without AD minA for coming upright, max cueing for upright posture > tall kneeling on mat with bench in front of pt modA + 2 for safety. Pt unable to maintain tall kneeling posture without max cueing and modA support at chest and posterior pelvis. Attempted to have pt participate in reaching for blocks to facilitate weight shifting and upright posture however pt continued to sit back onto legs d/t significant fatigue and therapist provided modA to transition pt into quadruped. In quadruped, pt performed forward/backward walking with hands 1-step x 2 in both directions. Therapist provided totalA to transition into sitting > sidelying d/t pt's significant fatigue. Sidelying > sitting EOM with modA for trunk upright and LE management. Squat pivot transfer from mat > w/c with modA for moving hips with multiple breaks and max cueing for sequencing. Vitals were stable  assessed at SpO2 99% and HR 78 bpm. Pt transported back to room in w/c, left seated in w/c with needs in reach and chair alarm set.   Therapy Documentation Precautions:  Precautions Precautions: Fall Restrictions Weight Bearing Restrictions: No    Therapy/Group: Individual Therapy  Juliann Pulse SPT 10/28/2019, 7:53 AM

## 2019-10-29 ENCOUNTER — Inpatient Hospital Stay (HOSPITAL_COMMUNITY): Payer: Medicare Other | Admitting: Occupational Therapy

## 2019-10-29 ENCOUNTER — Inpatient Hospital Stay (HOSPITAL_COMMUNITY): Payer: Medicare Other | Admitting: Physical Therapy

## 2019-10-29 ENCOUNTER — Inpatient Hospital Stay (HOSPITAL_COMMUNITY): Payer: Medicare Other | Admitting: Speech Pathology

## 2019-10-29 ENCOUNTER — Inpatient Hospital Stay (HOSPITAL_COMMUNITY): Payer: Medicare Other

## 2019-10-29 LAB — GLUCOSE, CAPILLARY
Glucose-Capillary: 113 mg/dL — ABNORMAL HIGH (ref 70–99)
Glucose-Capillary: 133 mg/dL — ABNORMAL HIGH (ref 70–99)
Glucose-Capillary: 151 mg/dL — ABNORMAL HIGH (ref 70–99)
Glucose-Capillary: 119 mg/dL — ABNORMAL HIGH (ref 70–99)

## 2019-10-29 NOTE — Progress Notes (Signed)
Speech Language Pathology Daily Session Note  Patient Details  Name: [name] [name] MRN: [id number] Date of Birth: [birth year]  Today's Date: 10/29/2019 SLP Individual Time: 0730-0800 SLP Individual Time Calculation (min): 30 min  Short Term Goals: Week 1: SLP Short Term Goal 1 (Week 1): Pt will utilize external aids to recall mildly complex daily information with min assist verbal cues. SLP Short Term Goal 2 (Week 1): Pt will complete mildly complex functional tasks with min assist for functional problem solving. SLP Short Term Goal 3 (Week 1): Pt will recognize and correct errors during mildly complex functional tasks with min assist verbal cues. SLP Short Term Goal 4 (Week 1): Pt will sustain his attention to functional tasks for 10 minute intervals with min verbal cues for redirection.  Skilled Therapeutic Interventions:  Skilled treatment session trageted cognition goals. SLP recieved pt while consuming breakfast tray and attempting to take his medicine. Pt required more than a reasonable amount of time to consume each bolus of food and to set shift between consuming breakfast food and taking medicine. Pt's responses to all questions and tasks were very delayed - often in excess of 10 seconds. SLP presented multiple cues but none were effective in decreasing response times.Pt largely unable to ocmmunicate ideas d/t prolonged processing. Pt's inability to express thoughts doesn't appear related to word finding deficits as pt is able to complete all aspects of naming without evidence of wording difficulty.  When boosting pt in bed, SLP felt that pt's pad was wet. Pt acknowledged that he knew it was wet but didn't say anything to me or to the NT who had just left pt's room. Of note, pt had more timely responses to directions for bed mobility. Pt was oriented x 4 and with Max A cues, pt able to sustain attention to basic task for 3 minute intervals. With Min A cues, pt able to demonstrate  intellectual awareness by identifying 1 physical and 1 cognitive deficit related to acute condition. Pt stated that he had new difficulty "getting his thoughts out." of acute Pt gave various reasons for increased time to consume boluses such as dislike of taste. In order to objectively assess pt's oropharyngeal abilities, a BSE was administered with additional trials of regular textures including peanut butter graham crackers and diced fruit cup.  Pt able to consume with functional mastication and no overt s/s of oropharyngeal dysphagia or aspiration. Recommend continuing current diet with no follow up dysphagia therapy. Pt left upright in bed, bed alarm on and all needs within reach. Continue per current plan of care.       Pain    Therapy/Group: Individual Therapy  Mickie Badders 10/29/2019, 3:03 PM

## 2019-10-29 NOTE — Progress Notes (Signed)
Team Conference Report to Patient/Family  Team Conference discussion was reviewed with the patient and caregiver, including goals for supervision level, any changes in plan of care and target discharge date.  Patient and caregiver express understanding and are in agreement.  The patient has a target discharge date of 11/13/19.  [name] B 10/29/2019, 1:12 PM

## 2019-10-29 NOTE — Progress Notes (Addendum)
[facility] PHYSICAL MEDICINE & REHABILITATION PROGRESS NOTE   Subjective/Complaints:  Feels fine today, some constipation , mild LBP not interfering with therapy  ROS- neg CP, SOB, N/V/D   Objective:   No results found. No results for input(s): WBC, HGB, HCT, PLT in the last 72 hours. No results for input(s): NA, K, CL, CO2, GLUCOSE, BUN, CREATININE, CALCIUM in the last 72 hours.  Intake/Output Summary (Last 24 hours) at 10/29/2019 0838 Last data filed at 10/28/2019 2031 Gross per 24 hour  Intake 340 ml  Output 100 ml  Net 240 ml     Physical Exam: Vital Signs Blood pressure 131/69, pulse 60, temperature 98.2 F (36.8 C), temperature source Oral, resp. rate 18, height 5\' 10"  (1.778 m), weight 84.7 kg, SpO2 98 %.    General: No acute distress Mood and affect are appropriate Heart: Regular rate and rhythm no rubs murmurs or extra sounds Lungs: Clear to auscultation, breathing unlabored, no rales or wheezes Abdomen: Positive bowel sounds, soft nontender to palpation, nondistended Extremities: No clubbing, cyanosis, or edema Skin: No evidence of breakdown, no evidence of rash   Neurologic: Cranial nerves II through XII intact, motor strength is 4/5 bilateral Delt, Bi, tri, grip, 4- R HF, 4/5 Left HF, 4/5 bilateral KE and ADF Sensory exam normal sensation to light touch and proprioception in bilateral upper and lower extremities Cerebellar exam normal finger to nose to finger as well as heel to shin in bilateral upper and lower extremities Musculoskeletal: Full range of motion in all 4 extremities. No joint swelling   Assessment/Plan: 1. 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  Physiatrist is providing close team supervision and 24 hour management of  active medical problems listed below.  Physiatrist and rehab team continue to assess barriers to discharge/monitor patient progress toward functional and medical goals  Care Tool:  Bathing    Body parts bathed by patient: Right arm, Left arm, Chest, Abdomen, Front perineal area, Right upper leg, Left upper leg, Face, Right lower leg, Left lower leg   Body parts bathed by helper: Buttocks     Bathing assist Assist Level: Moderate Assistance - Patient 50 - 74%     Upper Body Dressing/Undressing Upper body dressing   What is the patient wearing?: Pull over shirt    Upper body assist Assist Level: Minimal Assistance - Patient > 75%    Lower Body Dressing/Undressing Lower body dressing      What is the patient wearing?: Incontinence brief, Pants     Lower body assist Assist for lower body dressing: Moderate Assistance - Patient 50 - 74%     Toileting Toileting    Toileting assist Assist for toileting: Minimal Assistance - Patient > 75% Assistive Device Comment: Urinal   Transfers Chair/bed transfer  Transfers assist     Chair/bed transfer assist level: Minimal Assistance - Patient > 75%     Locomotion Ambulation   Ambulation assist      Assist level: Moderate Assistance - Patient 50 - 74% Assistive device: No Device Max distance: 4'   Walk 10 feet activity   Assist  Walk 10 feet activity did not occur: Safety/medical concerns(decreased balance/activity tolerance)        Walk 50 feet activity   Assist Walk 50 feet with 2 turns activity did not occur:  Safety/medical concerns(decreased balance/activity tolerance)         Walk 150 feet activity   Assist Walk 150 feet activity did not occur: Safety/medical concerns(decreased balance/activity tolerance)         Walk 10 feet on uneven surface  activity   Assist Walk 10 feet on uneven surfaces activity did not occur: Safety/medical concerns(decreased balance/activity tolerance)          Wheelchair     Assist   Type of Wheelchair: Manual    Wheelchair assist level: Minimal Assistance - Patient > 75% Max wheelchair distance: 20'    Wheelchair 50 feet with 2 turns activity    Assist        Assist Level: Minimal Assistance - Patient > 75%   Wheelchair 150 feet activity     Assist      Assist Level: Maximal Assistance - Patient 25 - 49%   Blood pressure 131/69, pulse 60, temperature 98.2 F (36.8 C), temperature source Oral, resp. rate 18, height 5\' 10"  (1.778 m), weight 84.7 kg, SpO2 98 %.  Medical Problem List and Plan: 1.  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  Antithrombotics: -DVT/anticoagulation/pulmonary emboli: Eliquis             -antiplatelet therapy: N/A 3. Pain Management: Neurontin 300 mg nightly, Lidoderm patch. Well controlled.  Lumbar post lami syndrome 4. Mood: Provide emotional support             -antipsychotic agents: N/A 5. Neuropsych: This patient is capable of making decisions on his own behalf. 6. Skin/Wound Care: Routine skin checks 7. Fluids/Electrolytes/Nutrition: Routine in and outs with follow-up chemistries 8.  Acute blood loss anemia.  Hgb stable on 3/7 9.  Hypertension.  Norvasc 10 mg daily, Coreg 25 mg twice daily, hydralazine as directed. Well controlled.  Vitals:   10/28/19 2021 10/29/19 0433  BP: 129/74 131/69  Pulse: 63 60  Resp: 18 18  Temp: 99.6 F (37.6 C) 98.2 F (36.8 C)  SpO2: 99% 98%  controlled today 3/11 10.  BPH.  Flomax 11.  Diabetes mellitus.  Hemoglobin A1c 6.7.  SSI.  Patient on Glucophage 500 mg twice daily prior to admission.  Resume as needed CBG (last 3)  Recent Labs    10/28/19 1658 10/28/19 2108 10/29/19 0622  GLUCAP 129* 125* 113*  Controlled 3/11 12.  Hyperlipidemia.  Lipitor 13.  Constipation:   3/7: ordered enema for this morning.  14. Pleural effusions:stable sized  15. Disposition: Lives with wife who is at home and will be able to help him. Has 3 daughters whom he is very close with who also live nearby.      LOS: 5 days A FACE TO FACE EVALUATION WAS PERFORMED  Charlett Blake 10/29/2019, 8:38 AM

## 2019-10-29 NOTE — Progress Notes (Signed)
Occupational Therapy Session Note  Patient Details  Name: [name] [name] MRN: [id number] Date of Birth: 1945/09/08  Today's Date: 10/29/2019 OT Individual Time: 1420-1531 OT Individual Time Calculation (min): 71 min    Short Term Goals: Week 1:  OT Short Term Goal 1 (Week 1): Pt will be able to sit to stand to RW with CGA. OT Short Term Goal 2 (Week 1): Pt will be able to ambulate to bathroom with CGA. OT Short Term Goal 3 (Week 1): Pt will be able to toilet with min A. OT Short Term Goal 4 (Week 1): Pt will be able to don pants with min A. OT Short Term Goal 5 (Week 1): Pt will demonstrate improved postural control to sit EOB with S only.  Skilled Therapeutic Interventions/Progress Updates:    Pt completed showering and dressing during session.  He was able to transfer from supine to sit EOB with supervision and maintain static sitting balance with min guard assist using BUEs for support, while therapist assisted with doffing TEDs for shower.  He then completed mod assist stand pivot transfer to the wheelchair as well as to the tub bench for showering.  Min instructional cueing to sequence bathing with pt needing increased time secondary to decreased sustained attention and reports of back pain.  He needed mod instructional cueing for thoroughness when drying off.  Dressing was completed at the sink sit to stand with transfer from the tub bench to the wheelchair with mod assist.  He needed mod assist for donning brief and pants with min assist for gripper socks using step stool on the right side.  He needed only supervision for donning a pullover shirt.  Mod questioning cueing was needed to sequence through dressing tasks as when pt was asked what did he need to do next, he would think he was finished, but in actuality he had not donned his shirt or socks.  Pt finished session sitting up in the wheelchair with call button and phone in reach with safety belt in place and call button and phone in  reach.  Spouse present throughout session as well.      Therapy Documentation Precautions:  Precautions Precautions: Fall Restrictions Weight Bearing Restrictions: No  Pain: Pain Assessment Pain Scale: Faces Pain Score: 0-No pain ADL: See Care Tool Section for some details of mobility and selfcare  Therapy/Group: Individual Therapy  Shawntell Dixson,Jonavon OTR/L 10/29/2019, 4:05 PM

## 2019-10-29 NOTE — Progress Notes (Signed)
Physical Therapy Session Note  Patient Details  Name: [name] [name] MRN: [id number] Date of Birth: November 02, 1945  Today's Date: 10/29/2019 PT Individual Time: 1009-1104 PT Individual Time Calculation (min): 55 min   Short Term Goals: Week 1:  PT Short Term Goal 1 (Week 1): Patient will perform bed mobility with supervision. PT Short Term Goal 2 (Week 1): Patient will perform basic transfer with CGA using LRAD. PT Short Term Goal 3 (Week 1): Patient will ambulate >50 ft using LRAD. PT Short Term Goal 4 (Week 1): Patient will initiate stair training.  Skilled Therapeutic Interventions/Progress Updates:    Pt received sitting in w/c and agreeable to therapy session. Transported to/from gym in w/c for energy conservation. Pt demonstrates overall slow/delayed processing throughout session requiring increased time to verbally respond to questions and to initiate motor tasks. R squat/stand pivot transfer to mat, no AD, with min/mod assist for balance. Sitting EOM performed trunk control task of lateral, forward, posterior, and cross body reaching to grasp legos and then stack on table - cuing and manual facilitation to limit UE support to promote increased core muscle activation. Pt reports low back discomfort from prolonged unsupported sitting requesting to transfer back to w/c. L squat/stand pivot transfer to w/c with min assist for balance. Sitting in w/c>tall kneeling on mat table with +2 mod assist for balance with max multimodal cuing for sequencing of how to get on mat with pt relying heavily on B UE support on bench. In tall kneeling, pt had significant forward trunk flexion with forearm support on bench requiring max multimodal cuing and mod/max manual facilitation to increase trunk/hip extension and push down through arms to come upright. Pt reporting onset of dizziness and lightheadedness requesting to get out of tall kneeling position. With +2 mod/max assist transitioned into  quadruped>sidelying>sitting EOM. L squat/stand pivot transfer to w/c with min/mod assist and cuing for sequencing. Assessed vitals in sitting: BP 111/68 (MAP 82), HR 64bpm, SpO2 98% on RA. Pt reporting less dizziness but when asked generally if he is feeling OK pt states "I don't know." Therapist spoke with Cherie, PT regarding pt's typical processing and response time as she evaled and saw this pt this morning - Cherie updated this therapist that pt's sister was being transported into ED this AM and that pt is concerned about her as he is his sisters POA. Therapist provided emotional support to pt regarding this situation - ensured Akiva, RN aware. Sit>stand w/c>RW with min/mod assist for balance and therapist reassessed vitals: BP 105/72 (MAP 82), HR 89bpm, SpO2 99%. Pt reports only "a little" dizzy/lightheadedness while standing. While in standing with RW pt demonstrates gradual B knee flexion and forward trunk flexion resulting in squat position requiring max and repeated cuing to extend hips and knees for improved upright posture to maintain standing ~75minute. Transported back to room and left seated in w/c with needs in reach and seat belt alarm on.  Therapy Documentation Precautions:  Precautions Precautions: Fall Restrictions Weight Bearing Restrictions: No  Pain:   Indicates low back discomfort from prolonged unsupported sitting - assisted pt back to w/c for back support.   Therapy/Group: Individual Therapy  Shelby Mattocks, PT, DPT Tawana Scale, PT, DPT 10/29/2019, 7:57 AM

## 2019-10-29 NOTE — Progress Notes (Signed)
Physical Therapy Session Note  Patient Details  Name: [name] [name] MRN: [id number] Date of Birth: [birth year]  Today's Date: 10/29/2019 PT Individual Time: [id number] PT Individual Time Calculation (min): 30 min   Short Term Goals: Week 1:  PT Short Term Goal 1 (Week 1): Patient will perform bed mobility with supervision. PT Short Term Goal 2 (Week 1): Patient will perform basic transfer with CGA using LRAD. PT Short Term Goal 3 (Week 1): Patient will ambulate >50 ft using LRAD. PT Short Term Goal 4 (Week 1): Patient will initiate stair training.  Skilled Therapeutic Interventions/Progress Updates:     Patient in in bed asleep upon PT arrival. Patient easily aroused and agreeable to PT session. Patient denied pain during session. Patient received a phone call at the beginning of the session that his sister was being taken by ambulance to [facility]. Patient became emotional following the phone call. PT comforted patient and discussed stressors of being in the hospital when a loved one is ill and reaching out for additional assistance to be support for his sister at this time. Patient stated that he appreciated this discussion.   Therapeutic Activity: Bed Mobility: Donned TED hose with total A for time management with patient in supine. Patient performed supine to sit with min A. Provided verbal cues for rolling to his side then pushing to sit up. He performed sitting balance with supervision-max A due to posterior lean and decreased trunk strength while donning pants and B tennis shoes with max A. Transfers: Patient performed stand pivot bed>w/c with min A using RW. Provided verbal cues for hand placement, forward weight shift, and sequencing.  Patient in w/c in room at end of session with breaks locked, seat belt alarm set, and all needs within reach.    Therapy Documentation Precautions:  Precautions Precautions: Fall Restrictions Weight Bearing Restrictions: No    Therapy/Group:  Individual Therapy  Ibrahem Volkman L Journi Moffa PT, DPT  10/29/2019, 5:13 PM

## 2019-10-30 ENCOUNTER — Inpatient Hospital Stay (HOSPITAL_COMMUNITY): Payer: Medicare Other | Admitting: Physical Therapy

## 2019-10-30 ENCOUNTER — Encounter (HOSPITAL_COMMUNITY): Payer: Medicare Other | Admitting: Psychology

## 2019-10-30 ENCOUNTER — Inpatient Hospital Stay (HOSPITAL_COMMUNITY): Payer: Medicare Other | Admitting: Occupational Therapy

## 2019-10-30 LAB — GLUCOSE, CAPILLARY
Glucose-Capillary: 118 mg/dL — ABNORMAL HIGH (ref 70–99)
Glucose-Capillary: 125 mg/dL — ABNORMAL HIGH (ref 70–99)
Glucose-Capillary: 147 mg/dL — ABNORMAL HIGH (ref 70–99)
Glucose-Capillary: 137 mg/dL — ABNORMAL HIGH (ref 70–99)

## 2019-10-30 MED ORDER — MAGNESIUM CITRATE PO SOLN
1.0000 | Freq: Once | ORAL | Status: AC
  Administered 2019-10-30: 1 via ORAL
  Filled 2019-10-30: qty 296

## 2019-10-30 NOTE — Plan of Care (Signed)
  Problem: Consults Goal: RH STROKE PATIENT EDUCATION Description: See Patient Education module for education specifics  Outcome: Progressing Goal: Diabetes Guidelines if Diabetic/Glucose > 140 Description: If diabetic or lab glucose is > 140 mg/dl - Initiate Diabetes/Hyperglycemia Guidelines & Document Interventions  Outcome: Progressing   Problem: RH BLADDER ELIMINATION Goal: RH STG MANAGE BLADDER WITH ASSISTANCE Description: STG Manage Bladder With min Assistance Outcome: Progressing   Problem: RH SKIN INTEGRITY Goal: RH STG SKIN FREE OF INFECTION/BREAKDOWN Outcome: Progressing Goal: RH STG MAINTAIN SKIN INTEGRITY WITH ASSISTANCE Description: STG Maintain Skin Integrity With min Assistance. Outcome: Progressing Goal: RH STG ABLE TO PERFORM INCISION/WOUND CARE W/ASSISTANCE Description: STG Able To Perform Incision/Wound Care With min Assistance. Outcome: Progressing   Problem: RH SAFETY Goal: RH STG ADHERE TO SAFETY PRECAUTIONS W/ASSISTANCE/DEVICE Description: STG Adhere to Safety Precautions With mod I Assistance/Device. Outcome: Progressing   Problem: RH KNOWLEDGE DEFICIT Goal: RH STG INCREASE KNOWLEDGE OF DIABETES Outcome: Progressing Goal: RH STG INCREASE KNOWLEDGE OF HYPERTENSION Outcome: Progressing Goal: RH STG INCREASE KNOWLEGDE OF HYPERLIPIDEMIA Outcome: Progressing Goal: RH STG INCREASE KNOWLEDGE OF STROKE PROPHYLAXIS Outcome: Progressing

## 2019-10-30 NOTE — Progress Notes (Signed)
Speech Language Pathology Daily Session Note  Patient Details  Name: Joandri Sory Zacarias MRN: [id number] Date of Birth: Dec 10, 1945  Today's Date: 10/30/2019 SLP Individual Time: 1030-1053 SLP Individual Time Calculation (min): 23 min  Short Term Goals: Week 1: SLP Short Term Goal 1 (Week 1): Pt will utilize external aids to recall mildly complex daily information with min assist verbal cues. SLP Short Term Goal 2 (Week 1): Pt will complete mildly complex functional tasks with min assist for functional problem solving. SLP Short Term Goal 3 (Week 1): Pt will recognize and correct errors during mildly complex functional tasks with min assist verbal cues. SLP Short Term Goal 4 (Week 1): Pt will sustain his attention to functional tasks for 10 minute intervals with min verbal cues for redirection.  Skilled Therapeutic Interventions:  Pt's wife was in the hallway and asked this Probation officer for help. Upon entering pt's room, she explained that pt was informed that his sister had passed away last night at [facility]. With Mod A question cues, pt able to convey that MD from acute come to his room and pt understood MD to say his sister passed away. Upon further investigation, he and his wife question pt's understanding and are concerned that sister may still be living and maybe in [id number]. This was very distressing to pt. This Probation officer investigated and obtained [id number]'s nurse's number.   Upon arriving back to pt's room, he had someone on speaker phone who was talking about pt's sister. SLP interceded and explained pt's current situation. SLP wrote down all information and also requested that rehab nursing be with pt when acute care staff explained matters related to pt's sister. All were very appreciative of services provided.      Pain Pain Assessment Pain Scale: 0-10 Pain Score: 0-No pain Faces Pain Scale: No hurt  Therapy/Group: Individual Therapy  Danayah Smyre 10/30/2019, 1:46 PM

## 2019-10-30 NOTE — Progress Notes (Signed)
Physical Therapy Session Note  Patient Details  Name: [name] [name] MRN: [id number] Date of Birth: December 11, 1945  Today's Date: 10/30/2019 PT Individual Time: 0800-0900 PT Individual Time Calculation (min): 60 min   Short Term Goals: Week 1:  PT Short Term Goal 1 (Week 1): Patient will perform bed mobility with supervision. PT Short Term Goal 2 (Week 1): Patient will perform basic transfer with CGA using LRAD. PT Short Term Goal 3 (Week 1): Patient will ambulate >50 ft using LRAD. PT Short Term Goal 4 (Week 1): Patient will initiate stair training.  Skilled Therapeutic Interventions/Progress Updates:    Patient received in bed, pleasant today but needing increased processing time to respond to therapist and willing to work with therapy. Able to perform bed mobliity with S, and functional transfers throughout session with ModA/RW; gait trained in room to his bathroom initially with [place] however after 70f he began to have more posterior lean increasing to MaxA as well as increased difficulty in moving his feet forwards despite Max cues from PT- at this point MD entered room and assisted in getting patient to a safe position by bringing wheelchair behind [place] Required ModA for safe stand to sit transfer, then once back in chair revealed [place] and that he feels very emotionally distraught/distracted today. Provided emotional support and reflective listening. Then able to transfer into bathroom using WC with modA for toilet transfers, and [place] manipulation to put on new brief and pants. From there worked on BUE endurance by pushing WC approximately 84fwith S and VC for visual scanning/straight line navigation. Rode Nustep with B LEs/UEs for 5 minutes at level one with slight SOB noted at end of activity. He was returned to his room and left up in the chair with all needs met and seat belt alarm active this morning. Discussed having chaplain come to  see him but he refuses at this time.   Therapy Documentation Precautions:  Precautions Precautions: Fall Restrictions Weight Bearing Restrictions: No Pain: Pain Assessment Pain Scale: 0-10 Pain Score: 0-No pain Faces Pain Scale: No hurt    Therapy/Group: Individual Therapy  KrWindell NorfolkDPT, PN1   Supplemental Physical Therapist [place]  Pager [phone] [phone] 10/30/2019, 12:26 PM

## 2019-10-30 NOTE — Progress Notes (Signed)
Physical Therapy Session Note  Patient Details  Name: [name] [name] MRN: [id number] Date of Birth: [birth year]  Today's Date: 10/30/2019 PT Individual Time: [id number] PT Individual Time Calculation (min): 69 min   Short Term Goals: Week 1:  PT Short Term Goal 1 (Week 1): Patient will perform bed mobility with supervision. PT Short Term Goal 2 (Week 1): Patient will perform basic transfer with CGA using LRAD. PT Short Term Goal 3 (Week 1): Patient will ambulate >50 ft using LRAD. PT Short Term Goal 4 (Week 1): Patient will initiate stair training.  Skilled Therapeutic Interventions/Progress Updates:   Pt received sitting in w/c with his wife present reporting that Palliative Care had just come to speak with them about the patient's sister. Despite this, pt agreeable to therapy session. Transported to/from gym in w/c for energy conservation. Sitting in w/c>tall kneeling on mat table with BUE support on bench and +2 min/mod assist for balance and safety with mod cuing for sequencing how to perform task. While in tall kneeling with mirror feedback worked on upright trunk posture without UE support on bench progressed to reaching for cups with min assist for balance and manual facilitation for increased trunk/hip extension. Pt suddenly reported R hamstring cramp therefore returned to sitting in w/c with mod assist and +2 present for safety. Once this dissipated returned to tall kneeling as described above. While in tall kneeling with B UE support on bench, performed repeated hip flexion/extension x5 reps targeting increased glute activation and R/L lateral stepping all with min/mod assist for balance and continued manual facilitation for increased trunk/hip extension and glute activation. Returned to sitting in w/c with mod assist. Gait training ~43ft using RW with mod assist of 1 and +2 for CGA as pt demonstrates L lateral trunk lean and progressively worsening B knee flexion resulting in squat position  requiring repeated cuing for improved upright posture and B knee extension. Attempted repeated sit<>stands elevated EOM<>no UE support with mod assist for balance due to posterior and L lateral trunk lean and despite mirror feedback and max multimodal cuing pt continued to push back of legs on mat and have strong posterior lean. Sit>supine with CGA and increased time for B LE management onto mat - pt reports significant B hip flexor stretch while in supine tolerated for ~49minute. Performed supine bridging x15reps with manual facilitation initially to motor plan task - demonstrates R knee falling outward therefore placed ball between knees to promote increased hip adductor activation x8reps. Supine>sitting EOB with min assist for B LE management and trunk upright and cuing for sequencing. Re-attempted repeated sit<>stands elevated EOM<>no UE support with mod assist; however, pt continues to demonstrate strong L posterolateral lean and increasing fatigue. Returned to sitting EOM and pt unable to maintain upright due to L posterior trunk lean in sitting requiring mod assist for sitting balance. Squat pivot transfer to w/c with min/mod assist and transported back to room in w/c. Squat/stand pivot transfer w/c<>EOB, no AD, with mod assist for balance due to increased fatigue to educate pt's wife on his CLOF. Pt left seated in w/c with needs in reach and seat belt alarm on.  Therapy Documentation Precautions:  Precautions Precautions: Fall Restrictions Weight Bearing Restrictions: No   Pain: Reports some low back discomfort during session but does not limit participation - provided rest breaks as needed.   Therapy/Group: Individual Therapy  [name], PT, DPT Page Spiro, PT, DPT 10/30/2019, 1:03 PM

## 2019-10-30 NOTE — Progress Notes (Signed)
[place] PHYSICAL MEDICINE & REHABILITATION PROGRESS NOTE   Subjective/Complaints:  Constipation , chronic used Mg Citrate at home every 2-3 d Pt did not sleep well last noc, states his sister passed away at this hospital yesterday   ROS- neg CP, SOB, N/V/D   Objective:   No results found. No results for input(s): WBC, HGB, HCT, PLT in the last 72 hours. No results for input(s): NA, K, CL, CO2, GLUCOSE, BUN, CREATININE, CALCIUM in the last 72 hours.  Intake/Output Summary (Last 24 hours) at 10/30/2019 0809 Last data filed at 10/29/2019 2105 Gross per 24 hour  Intake 600 ml  Output 450 ml  Net 150 ml     Physical Exam: Vital Signs Blood pressure 134/73, pulse 62, temperature 98 F (36.7 C), resp. rate 18, height 5\' 10"  (1.778 m), weight 84.7 kg, SpO2 99 %.    General: No acute distress Mood and affect are appropriate Heart: Regular rate and rhythm no rubs murmurs or extra sounds Lungs: Clear to auscultation, breathing unlabored, no rales or wheezes Abdomen: Positive bowel sounds, soft nontender to palpation, nondistended Extremities: No clubbing, cyanosis, or edema Skin: No evidence of breakdown, no evidence of rash   Neurologic: Cranial nerves II through XII intact, motor strength is 4/5 bilateral Delt, Bi, tri, grip, 4- R HF, 4/5 Left HF, 4/5 bilateral KE and ADF Sensory exam normal sensation to light touch and proprioception in bilateral upper and lower extremities Cerebellar exam normal finger to nose to finger as well as heel to shin in bilateral upper and lower extremities Musculoskeletal: Full range of motion in all 4 extremities. No joint swelling   Assessment/Plan: 1. 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  Physiatrist is providing  close team supervision and 24 hour management of active medical problems listed below.  Physiatrist and rehab team continue to assess barriers to discharge/monitor patient progress toward functional and medical goals  Care Tool:  Bathing    Body parts bathed by patient: Right arm, Left arm, Chest, Abdomen, Front perineal area, Right upper leg, Left upper leg, Face, Right lower leg, Left lower leg   Body parts bathed by helper: Buttocks     Bathing assist Assist Level: Moderate Assistance - Patient 50 - 74%     Upper Body Dressing/Undressing Upper body dressing   What is the patient wearing?: Pull over shirt    Upper body assist Assist Level: Supervision/Verbal cueing    Lower Body Dressing/Undressing Lower body dressing      What is the patient wearing?: Incontinence brief, Pants     Lower body assist Assist for lower body dressing: Moderate Assistance - Patient 50 - 74%     Toileting Toileting    Toileting assist Assist for toileting: Minimal Assistance - Patient > 75% Assistive Device Comment: Urinal   Transfers Chair/bed transfer  Transfers assist     Chair/bed transfer assist level: Moderate Assistance - Patient 50 - 74%     Locomotion Ambulation   Ambulation assist      Assist level: Moderate Assistance - Patient 50 - 74% Assistive device: No Device Max distance: 4'   Walk 10 feet activity   Assist  Walk 10 feet activity did not occur: Safety/medical concerns(decreased balance/activity tolerance)        Walk 50 feet activity   Assist  Walk 50 feet with 2 turns activity did not occur: Safety/medical concerns(decreased balance/activity tolerance)         Walk 150 feet activity   Assist Walk 150 feet activity did not occur: Safety/medical concerns(decreased balance/activity tolerance)         Walk 10 feet on uneven surface  activity   Assist Walk 10 feet on uneven surfaces activity did not occur: Safety/medical  concerns(decreased balance/activity tolerance)         Wheelchair     Assist   Type of Wheelchair: Manual    Wheelchair assist level: Minimal Assistance - Patient > 75% Max wheelchair distance: 31'    Wheelchair 50 feet with 2 turns activity    Assist        Assist Level: Minimal Assistance - Patient > 75%   Wheelchair 150 feet activity     Assist      Assist Level: Maximal Assistance - Patient 25 - 49%   Blood pressure 134/73, pulse 62, temperature 98 F (36.7 C), resp. rate 18, height 5\' 10"  (1.778 m), weight 84.7 kg, SpO2 99 %.  Medical Problem List and Plan: 1.  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  Antithrombotics: -DVT/anticoagulation/pulmonary emboli: Eliquis             -antiplatelet therapy: N/A 3. Pain Management: Neurontin 300 mg nightly, Lidoderm patch. Well controlled.  Lumbar post lami syndrome 4. Mood: Provide emotional support             -antipsychotic agents: N/A 5. Neuropsych: This patient is capable of making decisions on his own behalf. 6. Skin/Wound Care: Routine skin checks 7. Fluids/Electrolytes/Nutrition: Routine in and outs with follow-up chemistries 8.  Acute blood loss anemia.  Hgb stable on 3/7 9.  Hypertension.  Norvasc 10 mg daily, Coreg 25 mg twice daily, hydralazine as directed. Well controlled.  Vitals:   10/29/19 1927 10/30/19 0512  BP: 117/70 134/73  Pulse: 67 62  Resp: 16 18  Temp: 98.6 F (37 C) 98 F (36.7 C)  SpO2: 99% 99%  controlled today 3/12 10.  BPH.  Flomax 11.  Diabetes mellitus.  Hemoglobin A1c 6.7.  SSI.  Patient on Glucophage 500 mg twice daily prior to admission.  Resume as needed CBG (last 3)  Recent Labs    10/29/19 1702 10/29/19 2101 10/30/19 0628  GLUCAP 151* 119* 118*  Controlled  3/12 12.  Hyperlipidemia.  Lipitor 13. Constipation:   3/7: ordered enema for this morning.  14. Pleural effusions:stable sized  15. Disposition: Lives with wife who is at home and will be able to help him. Has 3 daughters whom he is very close with who also live nearby.      LOS: 6 days A FACE TO FACE EVALUATION WAS PERFORMED  Charlett Blake 10/30/2019, 8:09 AM

## 2019-10-30 NOTE — Consult Note (Signed)
Neuropsychological Consultation   Patient:   [name] [name]   DOB:   [birth year]  MR Number:  [id number]  Location:  [facility] [place] [place] [id number] [place] [address] Dept: [place]: [phone]           Date of Service:   10/30/19  Start Time:   9 AM End Time:   10 AM  Provider/Observer:  Ilean Skill, Psy.D.       Clinical Neuropsychologist       Billing Code/Service: [id number]  Chief Complaint:    [name] [name] is a 74 year old male with history of chronic back pain with discectomy as well as anterior lateral lumbar fusion, BPH, hypertension, diabetes, hyperlipidemia.  Presented 10/08/19 with lower chest and back pain with epigastric discomfort over prior several weeks along with nausea and vomiting.  Patient with long segment aortic dissection.  Small pulmonary embolus involving right lobe pulmonary artery.  Patient developed altered mental status and MRI showed small acute bilateral cerebral and cerebellar infarcts consistent with emboli.  Patient has now been admited for a comprehension rehab program due to functional decline.    Reason for Service:  Patient was referred for neuropsychological consultation due to coping and adjustment issues.  Below is the HPI for the current admission.    HPI: [name] [name] is a 74 year old right-handed male with history of chronic back pain with lumbar discectomy 2014 as well as anterior lateral lumbar fusion 2016, BPH, hypertension, diabetes mellitus, hyperlipidemia and remote tobacco abuse.  Per chart review lives with spouse independent prior to admission.  Two-level home bed and bath on main level.  Presented 10/08/2019 with lower chest and back pain with epigastric discomfort times several weeks as well as bouts of nausea and vomiting.  In the ED blood pressure 180s/130s.  EKG showed nonspecific ST-T changes not significantly different from prior EKG  2016.  First troponin HS 28, potassium 3.3, creatinine 1.25, WBC 14,100, hemoglobin 14.5, SARS coronavirus negative.  Chest x-ray no active disease.  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  Small pulmonary embolus involving the central right lobe pulmonary artery as well as probable additional small subsegmental embolus in the right lower lobe.  Echocardiogram with ejection fraction 65%.  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 Donzetta Matters.  Placed on intravenous heparin therapy.  Neurology consulted 10/17/2019 for altered mental status.  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  Carotid Dopplers with no ICA stenosis.  Neurology follow-up and heparin was transitioned to Eliquis.  Hospital course blood loss anemia 9.3 and monitored with follow-up per GI services there was some fear of possible GI bleed related to intravenous heparin as he was transition to Eliquis no other overt bleeding noted..  Tolerating a regular consistency diet.  Patient was admitted for a comprehensive rehab program.  Current Status:  The patient was alert  And oriented.  He was not able to explain what had happened to him beyond that he had a stroke.  Patient reported stress with decline in function and extended time in hospital.  Patient reports that he is motivated to improve.  Patient with likely late effects of prior strokes as well.  Patient with SVD.  Mild/moderate cognitive deficits with slowed information processing speed  and some memory issues but is learning new information.  Mood stable, and denies significant depressive symptoms or anxiety symptoms.    Behavioral Observation: [name] [name]  presents as a 74 y.o.-year-old Right African American Male who appeared his stated age. his dress was  Appropriate and he was Well Groomed and his manners were Appropriate to the situation.  his participation was indicative of Appropriate and Redirectable behaviors.  There were physical disabilities noted.  he displayed an appropriate level of cooperation and motivation.     Interactions:    Active Appropriate and Redirectable  Attention:   abnormal and attention span appeared shorter than expected for age  Memory:   abnormal; remote memory intact, recent memory impaired  Visuo-spatial:  not examined  Speech (Volume):  low  Speech:   normal; Slowed response times  Thought Process:  Coherent and Relevant  Though Content:  WNL; not suicidal and not homicidal  Orientation:   Dyck, place and time/date  Judgment:   Fair  Planning:   Poor  Affect:    Flat  Mood:    Dysphoric  Insight:   Fair  Intelligence:   normal  Medical History:   Past Medical History:  Diagnosis Date  . Chronic back pain    stenosis  . Enlarged prostate   . GERD (gastroesophageal reflux disease)    occasionally will take a zantac(maybe once a month)  . History of colon polyps   . History of kidney stones   . History of stress test    done 10 yrs. ago, as a baseline   . Hyperlipidemia    takes Crestor daily  . Hypertension    takes Amlodipine daily and Lotensin as well   Psychiatric History:  Patient with no prior psychiatric history.  Family Med/Psych History: History reviewed. No pertinent family history.  Risk of Suicide/Violence: virtually non-existent   Impression/DX:  [name] [name] is a 74 year old male with history of chronic back pain with discectomy as well as anterior lateral lumbar fusion, BPH, hypertension, diabetes, hyperlipidemia.  Presented 10/08/19 with lower chest and back pain with epigastric discomfort over prior several weeks along with nausea and vomiting.  Patient with long segment aortic dissection.  Small pulmonary embolus involving right lobe pulmonary artery.  Patient  developed altered mental status and MRI showed small acute bilateral cerebral and cerebellar infarcts consistent with emboli.  Patient has now been admited for a comprehension rehab program due to functional decline.    The patient was alert  And oriented.  He was not able to explain what had happened to him beyond that he had a stroke.  Patient reported stress with decline in function and extended time in hospital.  Patient reports that he is motivated to improve.  Patient with likely late effects of prior strokes as well.  Patient with SVD.  Mild/moderate cognitive deficits with slowed information processing speed and some memory issues but is learning new information.  Mood stable, and denies significant depressive symptoms or anxiety symptoms.  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

## 2019-10-30 NOTE — Progress Notes (Signed)
Occupational Therapy Session Note  Patient Details  Name: [name] [name] MRN: [id number] Date of Birth: 08/08/1946  Today's Date: 10/30/2019 OT Individual Time: 1103-1202 OT Individual Time Calculation (min): 59 min    Short Term Goals: Week 1:  OT Short Term Goal 1 (Week 1): Pt will be able to sit to stand to RW with CGA. OT Short Term Goal 2 (Week 1): Pt will be able to ambulate to bathroom with CGA. OT Short Term Goal 3 (Week 1): Pt will be able to toilet with min A. OT Short Term Goal 4 (Week 1): Pt will be able to don pants with min A. OT Short Term Goal 5 (Week 1): Pt will demonstrate improved postural control to sit EOB with S only.  Skilled Therapeutic Interventions/Progress Updates:    Pt worked on bathing and dressing during session.  He was able to transfer from supine to sit EOB with supervision and then ambulated to the shower with mod assist using the RW for support.  He was then able to complete undressing with min assist prior to shower.  Min instructional cueing to sequence bathing sit to stand with use of the grab bar for support.  He then transferred out to the wheelchair with mod assist using the RW for dressing tasks.  Min assist for all LB dressing sit to stand.  Slight increased difficulty donning his right sock by crossing it over the left knee, but therapist assisted with keeping the LE crossed.  Supervision for donning pullover shirt.  Pt still needing overall min instructional cueing to sequence and process through bathing secondary to internal and external distractions and decreased selective attention.  Finished session with pt in the wheelchair with call button and phone in reach and safety belt in place.    Therapy Documentation Precautions:  Precautions Precautions: Fall Restrictions Weight Bearing Restrictions: No   Pain: Pain Assessment Pain Scale: 0-10 Pain Score: 0-No pain Faces Pain Scale: No hurt ADL: See Care Tool Section for some details of  mobility and selfcare  Therapy/Group: Individual Therapy  [name] [name],Hamad OTR/L 10/30/2019, 12:33 PM

## 2019-10-31 ENCOUNTER — Inpatient Hospital Stay (HOSPITAL_COMMUNITY): Payer: Medicare Other | Admitting: Speech Pathology

## 2019-10-31 LAB — GLUCOSE, CAPILLARY
Glucose-Capillary: 121 mg/dL — ABNORMAL HIGH (ref 70–99)
Glucose-Capillary: 116 mg/dL — ABNORMAL HIGH (ref 70–99)
Glucose-Capillary: 158 mg/dL — ABNORMAL HIGH (ref 70–99)
Glucose-Capillary: 144 mg/dL — ABNORMAL HIGH (ref 70–99)

## 2019-10-31 MED ORDER — SORBITOL 70 % SOLN
30.0000 mL | Freq: Every day | Status: DC | PRN
  Administered 2019-11-02: 30 mL via ORAL
  Filled 2019-10-31 (×2): qty 30

## 2019-10-31 MED ORDER — SENNOSIDES-DOCUSATE SODIUM 8.6-50 MG PO TABS
1.0000 | ORAL_TABLET | Freq: Two times a day (BID) | ORAL | Status: DC
  Administered 2019-10-31 – 2019-11-04 (×9): 1 via ORAL
  Filled 2019-10-31 (×9): qty 1

## 2019-10-31 MED ORDER — BISACODYL 10 MG RE SUPP: 10.0000 mg | Freq: Every day | RECTAL | Status: DC | PRN

## 2019-10-31 NOTE — Plan of Care (Signed)
  Problem: Consults Goal: RH STROKE PATIENT EDUCATION Description: See Patient Education module for education specifics  Outcome: Progressing Goal: Diabetes Guidelines if Diabetic/Glucose > 140 Description: If diabetic or lab glucose is > 140 mg/dl - Initiate Diabetes/Hyperglycemia Guidelines & Document Interventions  Outcome: Progressing   Problem: RH BLADDER ELIMINATION Goal: RH STG MANAGE BLADDER WITH ASSISTANCE Description: STG Manage Bladder With min Assistance Outcome: Progressing   Problem: RH SKIN INTEGRITY Goal: RH STG SKIN FREE OF INFECTION/BREAKDOWN Outcome: Progressing Goal: RH STG MAINTAIN SKIN INTEGRITY WITH ASSISTANCE Description: STG Maintain Skin Integrity With min Assistance. Outcome: Progressing Goal: RH STG ABLE TO PERFORM INCISION/WOUND CARE W/ASSISTANCE Description: STG Able To Perform Incision/Wound Care With min Assistance. Outcome: Progressing   Problem: RH SAFETY Goal: RH STG ADHERE TO SAFETY PRECAUTIONS W/ASSISTANCE/DEVICE Description: STG Adhere to Safety Precautions With mod I Assistance/Device. 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 Outcome: Not Progressing Goal: RH STG MANAGE BOWEL W/MEDICATION W/ASSISTANCE Description: STG Manage Bowel with Medication with mod Assistance. Outcome: Not Progressing

## 2019-10-31 NOTE — Progress Notes (Signed)
Speech Language Pathology Daily Session Note  Patient Details  Name: [name] [name] MRN: [id number] Date of Birth: [birth year]  Today's Date: 10/31/2019 SLP Individual Time: 1245-1345 SLP Individual Time Calculation (min): 60 min  Short Term Goals: Week 1: SLP Short Term Goal 1 (Week 1): Pt will utilize external aids to recall mildly complex daily information with min assist verbal cues. SLP Short Term Goal 2 (Week 1): Pt will complete mildly complex functional tasks with min assist for functional problem solving. SLP Short Term Goal 3 (Week 1): Pt will recognize and correct errors during mildly complex functional tasks with min assist verbal cues. SLP Short Term Goal 4 (Week 1): Pt will sustain his attention to functional tasks for 10 minute intervals with min verbal cues for redirection.  Skilled Therapeutic Interventions: Skilled treatment session targeted cognition goals. SLP facilitiated session by providing Min A cues for pt to recall his sister's room number, Mod A cues for pt to recall information related to her treatment/diagnosis. Pt express desire to see his sister. SLP coordinated with pt's nurse as well as nurse on [facility]. SLP accompanied pt to sister's room. Pt's questions of [facility] nurse indicated lack of understanding/retention of information that she had just provided. SLP facilitated visit and reviewed information with pt afterwards. SLP returned pt to his room and left with his wife.      Pain    Therapy/Group: Individual Therapy  [name] [name] 10/31/2019, 4:31 PM

## 2019-10-31 NOTE — Progress Notes (Signed)
Last bowel movement 3/6. Mag citrate given yesterday. Sorbitol given this morning. Positive bowel sounds. Patient denies any abdominal pain, nausea or vomiting. Continue to monitor.

## 2019-10-31 NOTE — Progress Notes (Signed)
[facility] PHYSICAL MEDICINE & REHABILITATION PROGRESS NOTE   Subjective/Complaints:  Pt reports feels like needs ot have BM- was given Mg citrate this AM- waiting to go.   Been >2 days since last BM- slept well.   ROS-  Pt denies SOB, abd pain, CP, N/V/C/D, and vision changes    Objective:   No results found. No results for input(s): WBC, HGB, HCT, PLT in the last 72 hours. No results for input(s): NA, K, CL, CO2, GLUCOSE, BUN, CREATININE, CALCIUM in the last 72 hours.  Intake/Output Summary (Last 24 hours) at 10/31/2019 1749 Last data filed at 10/31/2019 1300 Gross per 24 hour  Intake 720 ml  Output --  Net 720 ml     Physical Exam: Vital Signs Blood pressure 113/68, pulse 60, temperature 97.8 F (36.6 C), resp. rate 18, height 5\' 10"  (1.778 m), weight 82.9 kg, SpO2 100 %.    General: No acute distress; sitting up in bed; appropriate, no stridor, NAD Slightly sad affect; but appropriate Heart: RRR Lungs: CTA B/L Abdomen: soft, NT, ND, (+)BS hyperactive Extremities: No clubbing, cyanosis, or edema Skin: No evidence of breakdown, no evidence of rash   Neurologic: Cranial nerves II through XII intact, motor strength is 4/5 bilateral Delt, Bi, tri, grip, 4- R HF, 4/5 Left HF, 4/5 bilateral KE and ADF Sensory exam normal sensation to light touch and proprioception in bilateral upper and lower extremities Cerebellar exam normal finger to nose to finger as well as heel to shin in bilateral upper and lower extremities Musculoskeletal: Full range of motion in all 4 extremities. No joint swelling   Assessment/Plan: 1. 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  Physiatrist is providing close team supervision and 24 hour management of active medical  problems listed below.  Physiatrist and rehab team continue to assess barriers to discharge/monitor patient progress toward functional and medical goals  Care Tool:  Bathing    Body parts bathed by patient: Right arm, Left arm, Chest, Abdomen, Front perineal area, Right upper leg, Left upper leg, Face, Right lower leg, Left lower leg, Buttocks   Body parts bathed by helper: Buttocks     Bathing assist Assist Level: Minimal Assistance - Patient > 75%     Upper Body Dressing/Undressing Upper body dressing   What is the patient wearing?: Pull over shirt    Upper body assist Assist Level: Supervision/Verbal cueing    Lower Body Dressing/Undressing Lower body dressing      What is the patient wearing?: Incontinence brief, Pants     Lower body assist Assist for lower body dressing: Minimal Assistance - Patient > 75%     Toileting Toileting    Toileting assist Assist for toileting: Minimal Assistance - Patient > 75% Assistive Device Comment: Urinal   Transfers Chair/bed transfer  Transfers assist     Chair/bed transfer assist level: Moderate Assistance - Patient 50 - 74%     Locomotion Ambulation   Ambulation assist      Assist level: Moderate Assistance - Patient 50 - 74% Assistive device: Walker-rolling Max distance: 37ft   Walk 10 feet activity   Assist  Walk 10 feet activity did not occur: Safety/medical concerns(decreased balance/activity tolerance)  Assist level: Moderate Assistance - Patient - 50 - 74% Assistive device: Walker-rolling   Walk 50 feet activity  Assist Walk 50 feet with 2 turns activity did not occur: Safety/medical concerns(decreased balance/activity tolerance)         Walk 150 feet activity   Assist Walk 150 feet activity did not occur: Safety/medical concerns(decreased balance/activity tolerance)         Walk 10 feet on uneven surface  activity   Assist Walk 10 feet on uneven surfaces activity did not occur:  Safety/medical concerns(decreased balance/activity tolerance)         Wheelchair     Assist   Type of Wheelchair: Manual    Wheelchair assist level: Supervision/Verbal cueing Max wheelchair distance: 80ft    Wheelchair 50 feet with 2 turns activity    Assist        Assist Level: Supervision/Verbal cueing   Wheelchair 150 feet activity     Assist      Assist Level: Maximal Assistance - Patient 25 - 49%   Blood pressure 113/68, pulse 60, temperature 97.8 F (36.6 C), resp. rate 18, height 5\' 10"  (1.778 m), weight 82.9 kg, SpO2 100 %.  Medical Problem List and Plan: 1.  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  Antithrombotics: -DVT/anticoagulation/pulmonary emboli: Eliquis             -antiplatelet therapy: N/A 3. Pain Management: Neurontin 300 mg nightly, Lidoderm patch. Well controlled.  Lumbar post lami syndrome 4. Mood: Provide emotional support             -antipsychotic agents: N/A 5. Neuropsych: This patient is capable of making decisions on his own behalf. 6. Skin/Wound Care: Routine skin checks 7. Fluids/Electrolytes/Nutrition: Routine in and outs with follow-up chemistries 8.  Acute blood loss anemia.  Hgb stable on 3/7 9.  Hypertension.  Norvasc 10 mg daily, Coreg 25 mg twice daily, hydralazine as directed. Well controlled.  Vitals:   10/31/19 1211 10/31/19 1500  BP: 127/73 113/68  Pulse:  60  Resp:  18  Temp:  97.8 F (36.6 C)  SpO2:  100%  controlled today 3/12  3/12- BP controlled- con't regimen 10.  BPH.  Flomax 11.  Diabetes mellitus.  Hemoglobin A1c 6.7.  SSI.  Patient on Glucophage 500 mg twice daily prior to admission.  Resume as needed CBG (last 3)  Recent Labs    10/31/19 0620 10/31/19 1145 10/31/19 1628  GLUCAP 121* 116* 158*   Controlled 3/12  3/13- BGs well controlled- con't regimen 12.  Hyperlipidemia.  Lipitor 13. Constipation:   3/7: ordered enema for this morning.   3/13- had large BM this AM after mg citrate 14. Pleural effusions:stable sized  15. Disposition: Lives with wife who is at home and will be able to help him. Has 3 daughters whom he is very close with who also live nearby.      LOS: 7 days A FACE TO FACE EVALUATION WAS PERFORMED  [name] [name] 10/31/2019, 5:49 PM

## 2019-10-31 NOTE — Progress Notes (Signed)
Patient noted with medium bowel movement soft semi formed continent on toilet.

## 2019-11-01 ENCOUNTER — Inpatient Hospital Stay (HOSPITAL_COMMUNITY): Payer: Medicare Other

## 2019-11-01 LAB — GLUCOSE, CAPILLARY
Glucose-Capillary: 114 mg/dL — ABNORMAL HIGH (ref 70–99)
Glucose-Capillary: 124 mg/dL — ABNORMAL HIGH (ref 70–99)
Glucose-Capillary: 129 mg/dL — ABNORMAL HIGH (ref 70–99)
Glucose-Capillary: 141 mg/dL — ABNORMAL HIGH (ref 70–99)

## 2019-11-01 NOTE — Progress Notes (Signed)
[facility] PHYSICAL MEDICINE & REHABILITATION PROGRESS NOTE   Subjective/Complaints:  Pt reports had good BM yesterday and feels cleaned out.   No issues - sleeping and eating well.   ROS-   Pt denies SOB, abd pain, CP, N/V/C/D, and vision changes     Objective:   No results found. No results for input(s): WBC, HGB, HCT, PLT in the last 72 hours. No results for input(s): NA, K, CL, CO2, GLUCOSE, BUN, CREATININE, CALCIUM in the last 72 hours.  Intake/Output Summary (Last 24 hours) at 11/01/2019 1405 Last data filed at 11/01/2019 0800 Gross per 24 hour  Intake 600 ml  Output -  Net 600 ml     Physical Exam: Vital Signs Blood pressure 114/64, pulse (!) 56, temperature 99.2 F (37.3 C), resp. rate 17, height 5\' 10"  (1.778 m), weight 82.9 kg, SpO2 99 %.    General: sitting up in bed; staring at blank TV, NAD Depressed affect; appropriate Heart: bradycardic regular rhythm; no JVD Lungs: CTA b/L- good air movement Abdomen: Soft, NT, ND, (+)BS  Extremities: No clubbing, cyanosis, or edema Skin: No evidence of breakdown, no evidence of rash   Neurologic: Cranial nerves II through XII intact, motor strength is 4/5 bilateral Delt, Bi, tri, grip, 4- R HF, 4/5 Left HF, 4/5 bilateral KE and ADF Sensory exam normal sensation to light touch and proprioception in bilateral upper and lower extremities Cerebellar exam normal finger to nose to finger as well as heel to shin in bilateral upper and lower extremities Musculoskeletal: Full range of motion in all 4 extremities. No joint swelling   Assessment/Plan: 1. 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  Physiatrist is providing close team supervision and 24 hour management of active medical problems listed  below.  Physiatrist and rehab team continue to assess barriers to discharge/monitor patient progress toward functional and medical goals  Care Tool:  Bathing    Body parts bathed by patient: Right arm, Left arm, Chest, Abdomen, Front perineal area, Right upper leg, Left upper leg, Face, Right lower leg, Left lower leg, Buttocks   Body parts bathed by helper: Buttocks     Bathing assist Assist Level: Minimal Assistance - Patient > 75%     Upper Body Dressing/Undressing Upper body dressing   What is the patient wearing?: Pull over shirt    Upper body assist Assist Level: Supervision/Verbal cueing    Lower Body Dressing/Undressing Lower body dressing      What is the patient wearing?: Incontinence brief, Pants     Lower body assist Assist for lower body dressing: Minimal Assistance - Patient > 75%     Toileting Toileting    Toileting assist Assist for toileting: Minimal Assistance - Patient > 75% Assistive Device Comment: Urinal   Transfers Chair/bed transfer  Transfers assist     Chair/bed transfer assist level: Moderate Assistance - Patient 50 - 74%     Locomotion Ambulation   Ambulation assist      Assist level: Moderate Assistance - Patient 50 - 74% Assistive device: Walker-rolling Max distance: 75ft   Walk 10 feet activity   Assist  Walk 10 feet activity did not occur: Safety/medical concerns(decreased balance/activity tolerance)  Assist level: Moderate Assistance - Patient - 50 - 74% Assistive device: Walker-rolling   Walk 50 feet activity  Assist Walk 50 feet with 2 turns activity did not occur: Safety/medical concerns(decreased balance/activity tolerance)         Walk 150 feet activity   Assist Walk 150 feet activity did not occur: Safety/medical concerns(decreased balance/activity tolerance)         Walk 10 feet on uneven surface  activity   Assist Walk 10 feet on uneven surfaces activity did not occur: Safety/medical  concerns(decreased balance/activity tolerance)         Wheelchair     Assist   Type of Wheelchair: Manual    Wheelchair assist level: Supervision/Verbal cueing Max wheelchair distance: 107ft    Wheelchair 50 feet with 2 turns activity    Assist        Assist Level: Supervision/Verbal cueing   Wheelchair 150 feet activity     Assist      Assist Level: Maximal Assistance - Patient 25 - 49%   Blood pressure 114/64, pulse (!) 56, temperature 99.2 F (37.3 C), resp. rate 17, height 5\' 10"  (1.778 m), weight 82.9 kg, SpO2 99 %.  Medical Problem List and Plan: 1.  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  Antithrombotics: -DVT/anticoagulation/pulmonary emboli: Eliquis             -antiplatelet therapy: N/A 3. Pain Management: Neurontin 300 mg nightly, Lidoderm patch. Well controlled.  Lumbar post lami syndrome 4. Mood: Provide emotional support             -antipsychotic agents: N/A 5. Neuropsych: This patient is capable of making decisions on his own behalf. 6. Skin/Wound Care: Routine skin checks 7. Fluids/Electrolytes/Nutrition: Routine in and outs with follow-up chemistries 8.  Acute blood loss anemia.  Hgb stable on 3/7 9.  Hypertension.  Norvasc 10 mg daily, Coreg 25 mg twice daily, hydralazine as directed. Well controlled.  Vitals:   10/31/19 1940 11/01/19 0450  BP: 114/67 114/64  Pulse: 61 (!) 56  Resp: 17 17  Temp: 98.7 F (37.1 C) 99.2 F (37.3 C)  SpO2: 98% 99%  controlled today 3/12  3/14- BP well controlled, however slightly bradycardic sometimes.  10.  BPH.  Flomax 11.  Diabetes mellitus.  Hemoglobin A1c 6.7.  SSI.  Patient on Glucophage 500 mg twice daily prior to admission.  Resume as needed CBG (last 3)  Recent Labs    10/31/19 2118  11/01/19 0608 11/01/19 1133  GLUCAP 144* 114* 124*  Controlled 3/12  3/14- good control- con't meds 12.  Hyperlipidemia.  Lipitor 13. Constipation:   3/7: ordered enema for this morning.   3/13- had large BM this AM after mg citrate  3/14- needs mg citrate every few days since did at home for years 14. Pleural effusions:stable sized  15. Disposition: Lives with wife who is at home and will be able to help him. Has 3 daughters whom he is very close with who also live nearby.      LOS: 8 days A FACE TO FACE EVALUATION WAS PERFORMED  [name] [name] 11/01/2019, 2:05 PM

## 2019-11-01 NOTE — Progress Notes (Signed)
Physical Therapy Session Note  Patient Details  Name: [name] [name] MRN: [id number] Date of Birth: [birth year]  Today's Date: 11/01/2019 PT Individual Time: [id number] PT Individual Time Calculation (min): 30 min   Short Term Goals: Week 1:  PT Short Term Goal 1 (Week 1): Patient will perform bed mobility with supervision. PT Short Term Goal 2 (Week 1): Patient will perform basic transfer with CGA using LRAD. PT Short Term Goal 3 (Week 1): Patient will ambulate >50 ft using LRAD. PT Short Term Goal 4 (Week 1): Patient will initiate stair training.  Skilled Therapeutic Interventions/Progress Updates:    Patient supine in bed upon PT arrival, agreeable to therapy tx, denies pain. Therapist donned B TEDs totalA. Supine > sit with supervision and cues for utilizing bed features and pushing up through elbow, minA for sitting balance initially and cueing for hands on knees and anterior trunk lean. Therapist donned pants and B shoes maxA for LE threading, throughout pt requiring max cueing to lean forward. Therapist donned clean shirt minA and minA for sitting balance as pt threaded UEs through. Sit > stand at [facility] with minA and cues for sequencing, in standing pt completed clothing management. Stand pivot transfer with RW to w/c with modA with cues for positioning in RW and sequencing. Pt transported in w/c to day room for time management and energy conservation. Sit > stand from w/c to RW with minA cues for bringing hips forward/glute squeeze and upright posture, pt ambulated 25' with RW and modA + 2 for w/c follow for safety only. During gait, pt demonstrates L lateral lean, inc L knee flexion, and posterior pelvic rotation requiring max cueing for body mechanics. Stand > sitting in w/c with minA and cues for sequencing. Pt transported back to room at end of session, left in w/c with needs in reach and chair alarm set.   Therapy Documentation Precautions:  Precautions Precautions:  Fall Restrictions Weight Bearing Restrictions: No   Therapy/Group: Individual Therapy  Juliann Pulse SPT 11/01/2019, 7:41 AM

## 2019-11-01 NOTE — Plan of Care (Signed)
  Problem: Consults Goal: RH STROKE PATIENT EDUCATION Description: See Patient Education module for education specifics  Outcome: Progressing Goal: Diabetes Guidelines if Diabetic/Glucose > 140 Description: If diabetic or lab glucose is > 140 mg/dl - Initiate Diabetes/Hyperglycemia Guidelines & Document Interventions  Outcome: Progressing   Problem: RH BLADDER ELIMINATION Goal: RH STG MANAGE BLADDER WITH ASSISTANCE Description: STG Manage Bladder With min Assistance Outcome: Progressing   Problem: RH SKIN INTEGRITY Goal: RH STG SKIN FREE OF INFECTION/BREAKDOWN Outcome: Progressing Goal: RH STG MAINTAIN SKIN INTEGRITY WITH ASSISTANCE Description: STG Maintain Skin Integrity With min Assistance. Outcome: Progressing Goal: RH STG ABLE TO PERFORM INCISION/WOUND CARE W/ASSISTANCE Description: STG Able To Perform Incision/Wound Care With min Assistance. Outcome: Progressing   Problem: RH SAFETY Goal: RH STG ADHERE TO SAFETY PRECAUTIONS W/ASSISTANCE/DEVICE Description: STG Adhere to Safety Precautions With mod I Assistance/Device. 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 Outcome: Progressing Goal: RH STG MANAGE BOWEL W/MEDICATION W/ASSISTANCE Description: STG Manage Bowel with Medication with mod Assistance. Outcome: Progressing

## 2019-11-02 ENCOUNTER — Inpatient Hospital Stay (HOSPITAL_COMMUNITY): Payer: Medicare Other | Admitting: Occupational Therapy

## 2019-11-02 ENCOUNTER — Inpatient Hospital Stay (HOSPITAL_COMMUNITY): Payer: Medicare Other

## 2019-11-02 ENCOUNTER — Inpatient Hospital Stay (HOSPITAL_COMMUNITY): Payer: Medicare Other | Admitting: Speech Pathology

## 2019-11-02 LAB — COMPREHENSIVE METABOLIC PANEL
ALT: 42 U/L (ref 0–44)
AST: 23 U/L (ref 15–41)
Albumin: 2.6 g/dL — ABNORMAL LOW (ref 3.5–5.0)
Alkaline Phosphatase: 77 U/L (ref 38–126)
Anion gap: 9 (ref 5–15)
BUN: 13 mg/dL (ref 8–23)
CO2: 24 mmol/L (ref 22–32)
Calcium: 9.3 mg/dL (ref 8.9–10.3)
Chloride: 107 mmol/L (ref 98–111)
Creatinine, Ser: 1.3 mg/dL — ABNORMAL HIGH (ref 0.61–1.24)
GFR calc Af Amer: >60 mL/min (ref >60)
GFR calc non Af Amer: 54 mL/min — ABNORMAL LOW (ref >60)
Glucose, Bld: 196 mg/dL — ABNORMAL HIGH (ref 70–99)
Potassium: 4.2 mmol/L (ref 3.5–5.1)
Sodium: 140 mmol/L (ref 135–145)
Total Bilirubin: 0.8 mg/dL (ref 0.3–1.2)
Total Protein: 7.1 g/dL (ref 6.5–8.1)

## 2019-11-02 LAB — GLUCOSE, CAPILLARY
Glucose-Capillary: 111 mg/dL — ABNORMAL HIGH (ref 70–99)
Glucose-Capillary: 155 mg/dL — ABNORMAL HIGH (ref 70–99)
Glucose-Capillary: 107 mg/dL — ABNORMAL HIGH (ref 70–99)
Glucose-Capillary: 172 mg/dL — ABNORMAL HIGH (ref 70–99)

## 2019-11-02 MED ORDER — ACETAMINOPHEN 325 MG PO TABS: 650.0000 mg | ORAL_TABLET | Freq: Four times a day (QID) | ORAL | Status: DC | PRN

## 2019-11-02 NOTE — Progress Notes (Signed)
Physical Therapy Session Note  Patient Details  Name: [name] [name] MRN: [id number] Date of Birth: Dec 16, 1945  Today's Date: 11/02/2019 PT Individual Time: [id number] PT Individual Time Calculation (min): 46 min   Short Term Goals: Week 1:  PT Short Term Goal 1 (Week 1): Patient will perform bed mobility with supervision. PT Short Term Goal 2 (Week 1): Patient will perform basic transfer with CGA using LRAD. PT Short Term Goal 3 (Week 1): Patient will ambulate >50 ft using LRAD. PT Short Term Goal 4 (Week 1): Patient will initiate stair training.  Skilled Therapeutic Interventions/Progress Updates:     Pt received sitting in WC. Reports slight pain in low back, chronic in nature. Numerical rating not provided and pt repositioned for comfort.Taken in [facility] to therapy gym. Pt performs sit to stand with minA and ambulates bouts of 80', 190' with seated rest break. Pt maintains slightly forward flexed posture for majority of ambulation. Able to correct with verbal and tactile cuing but returns to previous posture after several steps. Pt has narrow base of support and feet frequently contact each other during swing phase. No overt LOBs but minA provided for safety.   There-ex: Pt transfers to high-low mat with minA with stand-step transfer and no assistive device. Pt performs supine bridges x3 sets of x10 reps. Able to completely clear buttocks with each rep. Pt performs x1 sustained bridge and holds for x2 minutes with frequent cuing to increase hip extension.   Pt attempts to ambulate back to room following ther-ex but demonstrates posterior lean upon standing and has posterior LOB after several steps. PT provides WC transport due to pt's fatigue. Pt left seated in WC with all needs within reach and chair alarm activated.  Therapy Documentation Precautions:  Precautions Precautions: Fall Restrictions Weight Bearing Restrictions: No    Therapy/Group: Individual Therapy  [name], PT,  DPT 11/02/2019, 10:55 AM

## 2019-11-02 NOTE — Progress Notes (Signed)
Occupational Therapy Session Note  Patient Details  Name: [name] [name] MRN: [id number] Date of Birth: [birth year]  Today's Date: 11/02/2019 OT Individual Time: [id number] OT Individual Time Calculation (min): 27 min    Short Term Goals: Week 1:  OT Short Term Goal 1 (Week 1): Pt will be able to sit to stand to RW with CGA. OT Short Term Goal 2 (Week 1): Pt will be able to ambulate to bathroom with CGA. OT Short Term Goal 3 (Week 1): Pt will be able to toilet with min A. OT Short Term Goal 4 (Week 1): Pt will be able to don pants with min A. OT Short Term Goal 5 (Week 1): Pt will demonstrate improved postural control to sit EOB with S only.  Skilled Therapeutic Interventions/Progress Updates:    Pt in bed to start session, agreeable to participation in OT session.  He transferred from supine to sit EOB with supervision and was able to initially maintain static sitting balance with supervision during dressing tasks.  Pt with bladder incontinence in the brief noted so worked on cleaning peri area sit to stand with overall min assist for balance.  He was then able to donn his brief and pants with min assist.  After sitting for a few mins he began to exhibit LOB posteriorly and to the left.  He was unable to then donn his pullover shirt without max assist for balance secondary to this.  Had him complete transfer stand pivot to the wheelchair with min assist using the RW, where therapist assisted with donning TEDs and he was able to donn his slip on shoes with min instructional cueing for completion.  Finished session with pt in the wheelchair and with call button and phone in reach and safety alarm in place.    Therapy Documentation Precautions:  Precautions Precautions: Fall Restrictions Weight Bearing Restrictions: No   Pain: Pain Assessment Pain Scale: Faces Pain Score: 0-No pain ADL: See Care Tool Section for some details of mobility and selfcare  Therapy/Group: Individual  Therapy  Khya Halls,[place] OTR/L 11/02/2019, 9:05 AM

## 2019-11-02 NOTE — Progress Notes (Signed)
Occupational Therapy Weekly Progress Note  Patient Details  Name: [name] [name] MRN: [id number] Date of Birth: [birth year]  Beginning of progress report period: October 25, 2019 End of progress report period: November 02, 2019  Today's Date: 11/02/2019 OT Individual Time: 1335-1443 OT Individual Time Calculation (min): 68 min    Patient has met 3 of 5 short term goals.  [name] [name] is making excellent progress with OT at this time.  He is able to complete all UB selfcare with supervision and LB selfcare sit to stand with min assist.  Mod instructional cueing is needed for sequencing tasks secondary to decreased selective attention and decreased cognitive processing.  He currently needs only min assist for toilet and walk-in shower transfers with use of the RW for support as well.  Endurance is still limited with rest breaks needed 3-4 times during ADL tasks.  In addition, he also reports increased back pain when sitting unsupported.  Static sitting balance is variable as he can transition to sitting from supine at supervision level and then maintain sitting balance with supervision initially, but after a few mins he demonstrate LOB to the left and posteriorly.  He needs max assist for dynamic sitting balance as well.  Feel he is on target to meet supervision level goals with anticipated discharge on 3/27.  Recommend continued CIR level therapy until discharge.    Patient continues to demonstrate the following deficits: muscle weakness, impaired timing and sequencing and decreased coordination, decreased attention, decreased awareness, decreased problem solving, decreased safety awareness, decreased memory and delayed processing and decreased sitting balance, decreased standing balance, decreased postural control and decreased balance strategies and therefore will continue to benefit from skilled OT intervention to enhance overall performance with BADL.  Patient progressing toward long term goals..  Continue  plan of care.  OT Short Term Goals Week 2:  OT Short Term Goal 1 (Week 2): Pt will be able to ambulate to bathroom with CGA and use of the RW for support. OT Short Term Goal 2 (Week 2): Pt will complete all dressing with no more than min assist from the EOB/. OT Short Term Goal 3 (Week 2): Pt will complete LB dressing sit to stand with min guard assist. OT Short Term Goal 4 (Week 2): Pt will complete bathing and dressing tasks with no more than two questioning cues.  Skilled Therapeutic Interventions/Progress Updates:    Pt worked on bathing, dressing, and grooming tasks during session.  He was able to ambulate from the toilet, where NT had helped him prior to therapist entering, to the tub bench with min assist.  He was able to complete all bathing sit to stand with min assist.  Mod questioning cueing to sequence washing all parts and then rinsing the soap off.  He reported increased back pain in sitting, causing internal distraction.  Next, he ambulated out to the wheelchair at the sink with min assist using the RW for support.  He was able to complete grooming tasks of shaving and brushing his teeth with setup and min instructional cueing.  He was then able to donn all clothing with min assist sit to stand and min questioning cueing to sequence.  Finished session with pt in the wheelchair and with call button and phone in reach.  Safety belt alarm in place with pt's spouse in the room as well.    Therapy Documentation Precautions:  Precautions Precautions: Fall Restrictions Weight Bearing Restrictions: No  Pain: Pain Assessment Pain Scale: Faces Pain  Score: 0-No pain ADL: See Care Tool Section for some details of mobility and selfcare  Therapy/Group: Individual Therapy  Yaacov Koziol,Amarii OTR/L 11/02/2019, 4:18 PM

## 2019-11-02 NOTE — Progress Notes (Signed)
Speech Language Pathology Weekly Progress and Session Note  Patient Details  Name: [name] [name] MRN: [id number] Date of Birth: 1945/11/21  Beginning of progress report period: October 25, 2019 End of progress report period: November 02, 2019  Today's Date: 11/02/2019 SLP Individual Time: 1100-1158 SLP Individual Time Calculation (min): 58 min  Short Term Goals: Week 1: SLP Short Term Goal 1 (Week 1): Pt will utilize external aids to recall mildly complex daily information with min assist verbal cues. SLP Short Term Goal 1 - Progress (Week 1): Progressing toward goal SLP Short Term Goal 2 (Week 1): Pt will complete mildly complex functional tasks with min assist for functional problem solving. SLP Short Term Goal 2 - Progress (Week 1): Progressing toward goal SLP Short Term Goal 3 (Week 1): Pt will recognize and correct errors during mildly complex functional tasks with min assist verbal cues. SLP Short Term Goal 3 - Progress (Week 1): Progressing toward goal SLP Short Term Goal 4 (Week 1): Pt will sustain his attention to functional tasks for 10 minute intervals with min verbal cues for redirection. SLP Short Term Goal 4 - Progress (Week 1): Met    New Short Term Goals: Week 2: SLP Short Term Goal 1 (Week 2): Pt will utilize external aids to recall mildly complex daily information with min assist verbal cues. SLP Short Term Goal 2 (Week 2): Pt will complete mildly complex functional tasks with min assist for functional problem solving. SLP Short Term Goal 3 (Week 2): Pt will recognize and correct errors during mildly complex functional tasks with min assist verbal cues. SLP Short Term Goal 4 (Week 2): Pt will sustain his attention to functional tasks for 10 minute intervals with min verbal cues for redirection.  Weekly Progress Updates: Pt has made slow gains this reporting period and met 1 out of 4 short term goals. Pt is currently ~Mod assist for due to cognitive impairments impacting his  speed of processing, basic problem solving, sustained attention, and short term recall. Pt has demonstrated improved sustained attention and recall of daily information with strategies. Pt and family education is ongoing; pt's wife has been present for several ST sessions to date but would benefit from further education closer to d/c date. Pt would continue to benefit from skilled ST while inpatient in order to maximize functional independence and reduce burden of care prior to discharge. Anticipate that pt will need 24/7 supervision at discharge in addition to [place] follow up at next level of care.       Intensity: Minumum of 1-2 x/day, 30 to 90 minutes Frequency: 3 to 5 out of 7 days Duration/Length of Stay: 11/13/19 Treatment/Interventions: Cognitive remediation/compensation;Cueing hierarchy;Functional tasks;Internal/external aids;Patient/family education   Daily Session  Skilled Therapeutic Interventions: Pt was seen for skilled ST targeting cognitive goals. SLP facilitated session with overall Max A verbal and visual cues for problem solving and error awareness during a basic 3-step action card sequencing task. Pt also required Max A in verbal sequencing task when discussing transfers (ex: transferring from bed to chair). Pt also required Mod A verbal and visual cues in order to calculate how many total therapy sessions he had today, when reviewing his schedule. Min A cues were required for redirection to tasks in a mildly distracting environment with background noise. Pt reported mental fatigue near end of session. Pt left sitting in wheelchair with seatbelt alarm set and needs within reach. Continue per current plan of care.        Pain  Pain Assessment Pain Scale: 0-10 Pain Score: 0-No pain  Therapy/Group: Individual Therapy  Arbutus Leas 11/02/2019, 7:33 AM

## 2019-11-02 NOTE — Progress Notes (Signed)
[name] [name] PHYSICAL MEDICINE & REHABILITATION PROGRESS NOTE   Subjective/Complaints:    ROS-   Pt denies SOB, abd pain, CP, N/V/C/D, and vision changes     Objective:   No results found. No results for input(s): WBC, HGB, HCT, PLT in the last 72 hours. No results for input(s): NA, K, CL, CO2, GLUCOSE, BUN, CREATININE, CALCIUM in the last 72 hours.  Intake/Output Summary (Last 24 hours) at 11/02/2019 0928 Last data filed at 11/01/2019 1833 Gross per 24 hour  Intake 600 ml  Output --  Net 600 ml     Physical Exam: Vital Signs Blood pressure (!) 133/59, pulse (!) 52, temperature 99.2 F (37.3 C), temperature source Oral, resp. rate 17, height 5\' 10"  (1.778 m), weight 82.2 kg, SpO2 100 %.    General: sitting up in bed; staring at blank TV, NAD Depressed affect; appropriate Heart: bradycardic regular rhythm; no JVD Lungs: CTA b/L- good air movement Abdomen: Soft, NT, ND, (+)BS  Extremities: No clubbing, cyanosis, or edema Skin: No evidence of breakdown, no evidence of rash   Neurologic: Cranial nerves II through XII intact, motor strength is 4/5 bilateral Delt, Bi, tri, grip, 4- R HF, 4/5 Left HF, 4/5 bilateral KE and ADF Sensory exam normal sensation to light touch and proprioception in bilateral upper and lower extremities Cerebellar exam normal finger to nose to finger as well as heel to shin in bilateral upper and lower extremities Musculoskeletal: Full range of motion in all 4 extremities. No joint swelling   Assessment/Plan: 1. 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  Physiatrist is providing close team supervision and 24 hour management of active medical problems listed below.  Physiatrist and rehab team continue to assess barriers to  discharge/monitor patient progress toward functional and medical goals  Care Tool:  Bathing    Body parts bathed by patient: Right arm, Left arm, Chest, Abdomen, Front perineal area, Right upper leg, Left upper leg, Face, Right lower leg, Left lower leg, Buttocks   Body parts bathed by helper: Buttocks     Bathing assist Assist Level: Minimal Assistance - Patient > 75%     Upper Body Dressing/Undressing Upper body dressing   What is the patient wearing?: Pull over shirt    Upper body assist Assist Level: Maximal Assistance - Patient 25 - 49%(sitting unsupported EOB)    Lower Body Dressing/Undressing Lower body dressing      What is the patient wearing?: Incontinence brief, Pants     Lower body assist Assist for lower body dressing: Minimal Assistance - Patient > 75%     Toileting Toileting    Toileting assist Assist for toileting: Minimal Assistance - Patient > 75% Assistive Device Comment: Urinal   Transfers Chair/bed transfer  Transfers assist     Chair/bed transfer assist level: Minimal Assistance - Patient > 75%     Locomotion Ambulation   Ambulation assist      Assist level: Moderate Assistance - Patient 50 - 74% Assistive device: Walker-rolling Max distance: 66ft   Walk 10 feet activity   Assist  Walk 10 feet activity did not occur: Safety/medical concerns(decreased balance/activity tolerance)  Assist level: Moderate Assistance - Patient - 50 - 74% Assistive device: Walker-rolling   Walk 50 feet activity   Assist Walk 50 feet with 2 turns activity did  not occur: Safety/medical concerns(decreased balance/activity tolerance)         Walk 150 feet activity   Assist Walk 150 feet activity did not occur: Safety/medical concerns(decreased balance/activity tolerance)         Walk 10 feet on uneven surface  activity   Assist Walk 10 feet on uneven surfaces activity did not occur: Safety/medical concerns(decreased balance/activity  tolerance)         Wheelchair     Assist   Type of Wheelchair: Manual    Wheelchair assist level: Supervision/Verbal cueing Max wheelchair distance: 79ft    Wheelchair 50 feet with 2 turns activity    Assist        Assist Level: Supervision/Verbal cueing   Wheelchair 150 feet activity     Assist      Assist Level: Maximal Assistance - Patient 25 - 49%   Blood pressure (!) 133/59, pulse (!) 52, temperature 99.2 F (37.3 C), temperature source Oral, resp. rate 17, height 5\' 10"  (1.778 m), weight 82.2 kg, SpO2 100 %.  Medical Problem List and Plan: 1.  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  Antithrombotics: -DVT/anticoagulation/pulmonary emboli: Eliquis             -antiplatelet therapy: N/A 3. Pain Management: Neurontin 300 mg nightly, Lidoderm patch. Well controlled.  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 Mood: Provide emotional support             -antipsychotic agents: N/A 5. Neuropsych: This patient is capable of making decisions on his own behalf. 6. Skin/Wound Care: Routine skin checks 7. Fluids/Electrolytes/Nutrition: Routine in and outs with follow-up chemistries 8.  Acute blood loss anemia.  Hgb stable on 3/7 9.  Hypertension.  Norvasc 10 mg daily, Coreg 25 mg twice daily, hydralazine as directed. Well controlled.  Vitals:   11/01/19 1950 11/02/19 0507  BP: (!) 113/54 (!) 133/59  Pulse: 65 (!) 52  Resp:  17  Temp: 99.3 F (37.4 C) 99.2 F (37.3 C)  SpO2: 96% 100%  controlled today 3/15  3/14- BP well controlled, however slightly bradycardic sometimes.  10.  BPH.  Flomax 11.  Diabetes mellitus.  Hemoglobin A1c 6.7.  SSI.  Patient on Glucophage 500  mg twice daily prior to admission.  Resume as needed CBG (last 3)  Recent Labs    11/01/19 1641 11/01/19 2121 11/02/19 0601  GLUCAP 129* 141* 111*  Controlled 3/15  3/14- good control- con't meds 12.  Hyperlipidemia.  Lipitor 13. Constipation:   3/7: ordered enema for this morning.   3/13- had large BM this AM after mg citrate  3/14- needs mg citrate every few days since did at home for years 14. Pleural effusions:stable sized  15. Disposition: Lives with wife who is at home and will be able to help him. Has 3 daughters whom he is very close with who also live nearby.      LOS: 9 days A FACE TO FACE EVALUATION WAS PERFORMED  Charlett Blake 11/02/2019, 9:28 AM

## 2019-11-02 NOTE — Consult Note (Signed)
Neuropsychological Consultation   Patient:   [name] [name]   DOB:   [birth year]  MR Number:  [id number]  Location:  [facility] [id number] [place] [address] Dept: [place]: [phone]           Date of Service:   11/02/2019  Start Time:   10 AM End Time:   10:30 AM  Provider/Observer:  Ilean Skill, Psy.D.       Clinical Neuropsychologist       Billing Code/Service: [id number]  Chief Complaint:    [name] [name]. Fencl is a 74 year old male with history of chronic back pain with discectomy as well as anterior lateral lumbar fusion, BPH, hypertension, diabetes, hyperlipidemia.  Presented 10/08/19 with lower chest and back pain with epigastric discomfort over prior several weeks along with nausea and vomiting.  Patient with long segment aortic dissection.  Small pulmonary embolus involving right lobe pulmonary artery.  Patient developed altered mental status and MRI showed small acute bilateral cerebral and cerebellar infarcts consistent with emboli.  Patient has now been admited for a comprehension rehab program due to functional decline.    Reason for Service:  Patient was referred for neuropsychological consultation due to coping and adjustment issues.  Below is the HPI for the current admission.    HPI: [name] [name]. [name] [name] is a 74 year old right-handed male with history of chronic back pain with lumbar discectomy 2014 as well as anterior lateral lumbar fusion 2016, BPH, hypertension, diabetes mellitus, hyperlipidemia and remote tobacco abuse.  Per chart review lives with spouse independent prior to admission.  Two-level home bed and bath on main level.  Presented 10/08/2019 with lower chest and back pain with epigastric discomfort times several weeks as well as bouts of nausea and vomiting.  In the ED blood pressure 180s/130s.  EKG showed nonspecific ST-T changes not significantly different from  prior EKG 2016.  First troponin HS 28, potassium 3.3, creatinine 1.25, WBC 14,100, hemoglobin 14.5, SARS coronavirus negative.  Chest x-ray no active disease.  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  Small pulmonary embolus involving the central right lobe pulmonary artery as well as probable additional small subsegmental embolus in the right lower lobe.  Echocardiogram with ejection fraction 65%.  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 Donzetta Matters.  Placed on intravenous heparin therapy.  Neurology consulted 10/17/2019 for altered mental status.  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  Carotid Dopplers with no ICA stenosis.  Neurology follow-up and heparin was transitioned to Eliquis.  Hospital course blood loss anemia 9.3 and monitored with follow-up per GI services there was some fear of possible GI bleed related to intravenous heparin as he was transition to Eliquis no other overt bleeding noted..  Tolerating a regular consistency diet.  Patient was admitted for a comprehensive rehab program.  Current Status:  T the patient continued to be alert and oriented although he had a lot of questions.  The patient's wife was present today on the phone.  The patient reports that he continues to have a lot of questions and difficulty recalling or remembering everything.  The patient's wife had questions about his hospital course and how he was managing with therapies.  She reports that she is noticed some continued  issues with cognition particularly with being able to complete his thoughts and keep his thoughts organized.  Behavioral Observation: [name] [name]  presents as a 74 y.o.-year-old Right African American Male who appeared his stated age. his dress was Appropriate and he was  Well Groomed and his manners were Appropriate to the situation.  his participation was indicative of Appropriate and Redirectable behaviors.  There were physical disabilities noted.  he displayed an appropriate level of cooperation and motivation.     Interactions:    Active Appropriate and Redirectable  Attention:   abnormal and attention span appeared shorter than expected for age  Memory:   abnormal; remote memory intact, recent memory impaired  Visuo-spatial:  not examined  Speech (Volume):  low  Speech:   normal; Slowed response times  Thought Process:  Coherent and Relevant  Though Content:  WNL; not suicidal and not homicidal  Orientation:   [place], place and time/date  Judgment:   Fair  Planning:   Poor  Affect:    Flat  Mood:    Dysphoric  Insight:   Fair  Intelligence:   normal  Medical History:   Past Medical History:  Diagnosis Date  . Chronic back pain    stenosis  . Enlarged prostate   . GERD (gastroesophageal reflux disease)    occasionally will take a zantac(maybe once a month)  . History of colon polyps   . History of kidney stones   . History of stress test    done 10 yrs. ago, as a baseline   . Hyperlipidemia    takes Crestor daily  . Hypertension    takes Amlodipine daily and Lotensin as well   Psychiatric History:  Patient with no prior psychiatric history.  Family Med/Psych History: History reviewed. No pertinent family history.  Risk of Suicide/Violence: virtually non-existent   Impression/DX:  [name] [name]. [name] [name] is a 74 year old male with history of chronic back pain with discectomy as well as anterior lateral lumbar fusion, BPH, hypertension, diabetes, hyperlipidemia.  Presented 10/08/19 with lower chest and back pain with epigastric discomfort over prior several weeks along with nausea and vomiting.  Patient with long segment aortic dissection.  Small pulmonary embolus involving right lobe pulmonary artery.  Patient developed altered  mental status and MRI showed small acute bilateral cerebral and cerebellar infarcts consistent with emboli.  Patient has now been admited for a comprehension rehab program due to functional decline.    The patient was alert  And oriented.  He was not able to explain what had happened to him beyond that he had a stroke.  Patient reported stress with decline in function and extended time in hospital.  Patient reports that he is motivated to improve.  Patient with likely late effects of prior strokes as well.  Patient with SVD.  Mild/moderate cognitive deficits with slowed information processing speed and some memory issues but is learning new information.  Mood stable, and denies significant depressive symptoms or anxiety symptoms.  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

## 2019-11-03 ENCOUNTER — Inpatient Hospital Stay (HOSPITAL_COMMUNITY): Payer: Medicare Other | Admitting: Speech Pathology

## 2019-11-03 ENCOUNTER — Inpatient Hospital Stay (HOSPITAL_COMMUNITY): Payer: Medicare Other | Admitting: Occupational Therapy

## 2019-11-03 ENCOUNTER — Inpatient Hospital Stay (HOSPITAL_COMMUNITY): Payer: Medicare Other

## 2019-11-03 LAB — GLUCOSE, CAPILLARY
Glucose-Capillary: 110 mg/dL — ABNORMAL HIGH (ref 70–99)
Glucose-Capillary: 155 mg/dL — ABNORMAL HIGH (ref 70–99)
Glucose-Capillary: 121 mg/dL — ABNORMAL HIGH (ref 70–99)
Glucose-Capillary: 122 mg/dL — ABNORMAL HIGH (ref 70–99)

## 2019-11-03 NOTE — Progress Notes (Signed)
Speech Language Pathology Daily Session Note  Patient Details  Name: [name] [name] MRN: [id number] Date of Birth: May 07, 1946  Today's Date: 11/03/2019 SLP Individual Time: 3406-8403 SLP Individual Time Calculation (min): 64 min  Short Term Goals: Week 2: SLP Short Term Goal 1 (Week 2): Pt will utilize external aids to recall mildly complex daily information with min assist verbal cues. SLP Short Term Goal 2 (Week 2): Pt will complete mildly complex functional tasks with min assist for functional problem solving. SLP Short Term Goal 3 (Week 2): Pt will recognize and correct errors during mildly complex functional tasks with min assist verbal cues. SLP Short Term Goal 4 (Week 2): Pt will sustain his attention to functional tasks for 10 minute intervals with min verbal cues for redirection.  Skilled Therapeutic Interventions: Pt was seen for skilled ST targeting cognitive goals. Pt recalled details of OT session including verbal recall of modifications needed for home bathroom with overall Mod A question cues from clinician. He also started to make a grocery list for planned activity with Mod A verbal cues required for recall during task. Palliative care LNP arrived during session to discuss pt's sister's medical status, during which SLP provided pt with emotional support and question cues to aid in comprehension and initiating appropriate questions for LNP. Also provided Min A verbal and visual cues for problem solving using pt's phone to find a family members' phone number to provide to LNP. Pt left laying in bed with alarm set and needs met to his satisfaction, pt's wife present. Continue per current plan of care.        Pain Pain Assessment Pain Scale: 0-10 Pain Score: 0-No pain  Therapy/Group: Individual Therapy  Arbutus Leas 11/03/2019, 7:19 AM

## 2019-11-03 NOTE — Progress Notes (Signed)
Physical Therapy Weekly Progress Note  Patient Details  Name: [name] [name] MRN: [id number] Date of Birth: [birth year]  Beginning of progress report period: October 25, 2019 End of progress report period: November 03, 2019  Today's Date: 11/03/2019 PT Individual Time: 0845-1000 PT Individual Time Calculation (min): 75 min   Patient has met 3 of 4 short term goals. Pt performs bed mobility with supervision, he has ambulated up to 190' with and RW and minA, and initiated stair training with minA x 4 steps. He is progressing towards performing transfers with CGA, currently requiring minA-CGA for stand pivot transfers with a RW.   Patient continues to demonstrate the following deficits muscle weakness, decreased cardiorespiratoy endurance, impaired timing and sequencing and decreased motor planning, decreased problem solving and delayed processing and decreased sitting balance, decreased standing balance, decreased postural control and decreased balance strategies and therefore will continue to benefit from skilled PT intervention to increase functional independence with mobility.  Patient progressing toward long term goals..  Continue plan of care.  PT Short Term Goals Week 1:  PT Short Term Goal 1 (Week 1): Patient will perform bed mobility with supervision. PT Short Term Goal 1 - Progress (Week 1): Met PT Short Term Goal 2 (Week 1): Patient will perform basic transfer with CGA using LRAD. PT Short Term Goal 2 - Progress (Week 1): Progressing toward goal PT Short Term Goal 3 (Week 1): Patient will ambulate >50 ft using LRAD. PT Short Term Goal 3 - Progress (Week 1): Met PT Short Term Goal 4 (Week 1): Patient will initiate stair training. PT Short Term Goal 4 - Progress (Week 1): Met Week 2:  PT Short Term Goal 1 (Week 2): STG = LTG d/t ELOS  Skilled Therapeutic Interventions/Progress Updates:    Patient supine in bed upon PT arrival, agreeable to therapy tx, denies pain. Therapist donned B TEDs  totalA. Pt performed bed mobility and sidelying > sitting EOB with supervision. In sitting, therapist donned shoes and pants maxA for LE threading. Sit > stand at [facility] with CGA, therapist continued clothing management and changed pt's brief. In standing, pt performed standing balance with unilateral to no UE support to donn shirt with minA to steady and cues for knee extension and upright posture as pt began to demonstrate crouched posture and posterior lean. Stand pivot transfer with RW > w/c with CGA-minA, cues for sequencing. Pt transported in w/c to rehab gym for time management and energy conservation. Pt ascended/descended x 4 steps with minA, demonstrating step to pattern and using B rails, cues for hip/knee extension and upright posture required. Stand pivot transfer > w/c with minA, pt transported to day room for energy conservation. Pt participated in treadmill training this session with use of litegait harness system, minA to step on/off treadmill and minA for standing balance while donning harness.   Gait training as follows: Trial 1- 2:40 min, 0.6-0.8 mph, 147 ft  Trial 2- 2:45 min, 0.6-0.8 mph, 167 ft   Pt required minA during gait on the treadmill for hip/knee extension and cues for step length. As UE support was reduced (BUE > no UE support), pt demonstrated inc crouched gait with significant knee flexion and ankle DF bilaterally, requiring verbal and tactile cueing. In between gait trials, pt performed standing balance with focus on multiple bouts of quad/glute sets to facilitate more hip/knee flexion and pt performed multiple reps of B heel raises with BUE support with minimal heel clearance. Sit > stand from w/c > tall  kneeling on mat with BUE on bench in front of pt with minA to get onto mat table. In tall kneeling, pt performed glute squeeze + reaching for bean bags to facilitate upright posture and for proximal strengthening, max verbal and tactile cueing required. Tall kneeling > quadruped  with minA. In quadruped, pt performed x 2 reps glute kickbacks with assist from ROM and minA for stability. Pt transitioned from quadruped > sitting on mat with CGA and cues for sequencing. Sit > stand without AD and minA, pt ambulated 5' to w/c without AD and minA + 2 for B HHA and safety. Pt demonstrated significantly crouched positioning and dec step length without BUE support. Pt transported back to room, left seated in w/c with needs in reach and chair alarm set.     Therapy Documentation Precautions:  Precautions Precautions: Fall Restrictions Weight Bearing Restrictions: No   Therapy/Group: Individual Therapy  Juliann Pulse SPT 11/03/2019, 7:20 AM

## 2019-11-03 NOTE — Plan of Care (Signed)
  Problem: RH BLADDER ELIMINATION Goal: RH STG MANAGE BLADDER WITH ASSISTANCE Description: STG Manage Bladder With min Assistance Outcome: Progressing

## 2019-11-03 NOTE — Progress Notes (Signed)
[facility] PHYSICAL MEDICINE & REHABILITATION PROGRESS NOTE   Subjective/Complaints:  Appreciate neuropsych consult   ROS-   Pt denies SOB, abd pain, CP, N/V/C/D, and vision changes     Objective:   No results found. No results for input(s): WBC, HGB, HCT, PLT in the last 72 hours. Recent Labs    11/02/19 1043  NA 140  K 4.2  CL 107  CO2 24  GLUCOSE 196*  BUN 13  CREATININE 1.30*  CALCIUM 9.3    Intake/Output Summary (Last 24 hours) at 11/03/2019 0824 Last data filed at 11/03/2019 0214 Gross per 24 hour  Intake 480 ml  Output 450 ml  Net 30 ml     Physical Exam: Vital Signs Blood pressure 131/67, pulse (!) 55, temperature 97.6 F (36.4 C), temperature source Oral, resp. rate 16, height 5\' 10"  (1.778 m), weight 81.8 kg, SpO2 100 %.     General: No acute distress Mood and affect are appropriate Heart: Regular rate and rhythm no rubs murmurs or extra sounds Lungs: Clear to auscultation, breathing unlabored, no rales or wheezes Abdomen: Positive bowel sounds, soft nontender to palpation, nondistended Extremities: No clubbing, cyanosis, or edema Skin: No evidence of breakdown, no evidence of rash  Neurologic:  motor strength is 4/5 bilateral Delt, Bi, tri, grip, 4- R HF, 4/5 Left HF, 4/5 bilateral KE and ADF Sensory exam normal sensation to light touch and proprioception in bilateral upper and lower extremities  Musculoskeletal: Full range of motion in all 4 extremities. No joint swelling   Assessment/Plan: 1. 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  Physiatrist is providing close team supervision and 24 hour management of active medical problems listed below.  Physiatrist and rehab team continue to assess barriers to  discharge/monitor patient progress toward functional and medical goals  Care Tool:  Bathing    Body parts bathed by patient: Right arm, Left arm, Chest, Abdomen, Front perineal area, Right upper leg, Left upper leg, Face, Right lower leg, Left lower leg, Buttocks   Body parts bathed by helper: Buttocks     Bathing assist Assist Level: Minimal Assistance - Patient > 75%     Upper Body Dressing/Undressing Upper body dressing   What is the patient wearing?: Pull over shirt    Upper body assist Assist Level: Maximal Assistance - Patient 25 - 49%(sitting unsupported EOB)    Lower Body Dressing/Undressing Lower body dressing      What is the patient wearing?: Incontinence brief, Pants     Lower body assist Assist for lower body dressing: Minimal Assistance - Patient > 75%     Toileting Toileting    Toileting assist Assist for toileting: Minimal Assistance - Patient > 75% Assistive Device Comment: Urinal   Transfers Chair/bed transfer  Transfers assist     Chair/bed transfer assist level: Minimal Assistance - Patient > 75%     Locomotion Ambulation   Ambulation assist      Assist level: Minimal Assistance - Patient > 75% Assistive device: Walker-rolling Max distance: 190   Walk 10 feet activity   Assist  Walk 10 feet activity did not occur: Safety/medical concerns(decreased balance/activity tolerance)  Assist level: Minimal Assistance - Patient > 75% Assistive device: Walker-rolling   Walk 50 feet activity   Assist Walk 50 feet with 2 turns activity did  not occur: Safety/medical concerns(decreased balance/activity tolerance)  Assist level: Minimal Assistance - Patient > 75% Assistive device: Walker-rolling    Walk 150 feet activity   Assist Walk 150 feet activity did not occur: Safety/medical concerns(decreased balance/activity tolerance)  Assist level: Minimal Assistance - Patient > 75% Assistive device: Walker-rolling    Walk 10 feet on  uneven surface  activity   Assist Walk 10 feet on uneven surfaces activity did not occur: Safety/medical concerns(decreased balance/activity tolerance)         Wheelchair     Assist   Type of Wheelchair: Manual    Wheelchair assist level: Supervision/Verbal cueing Max wheelchair distance: 68ft    Wheelchair 50 feet with 2 turns activity    Assist        Assist Level: Supervision/Verbal cueing   Wheelchair 150 feet activity     Assist      Assist Level: Maximal Assistance - Patient 25 - 49%   Blood pressure 131/67, pulse (!) 55, temperature 97.6 F (36.4 C), temperature source Oral, resp. rate 16, height 5\' 10"  (1.778 m), weight 81.8 kg, SpO2 100 %.  Medical Problem List and Plan: 1.  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  Antithrombotics: -DVT/anticoagulation/pulmonary emboli: Eliquis             -antiplatelet therapy: N/A 3. Pain Management: Neurontin 300 mg nightly, Lidoderm patch. Well controlled.  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 Mood: Provide emotional support             -antipsychotic agents: N/A 5. Neuropsych: This patient is capable of making decisions on his own behalf. 6. Skin/Wound Care: Routine skin checks 7. Fluids/Electrolytes/Nutrition: Routine in and outs with follow-up chemistries 8.  Acute blood loss anemia.  Hgb stable on 3/7 9.  Hypertension.  Norvasc 10 mg daily, Coreg 25 mg twice daily, hydralazine as directed. Well controlled.  Vitals:   11/02/19 2001 11/03/19 0443  BP: 115/67 131/67  Pulse: 61 (!) 55  Resp: 16 16  Temp: 99.1 F (37.3 C) 97.6 F (36.4 C)  SpO2: 99% 100%  controlled today 3/16  Asymptomatic brady  10.  BPH.   Flomax 11.  Diabetes mellitus.  Hemoglobin A1c 6.7.  SSI.  Patient on Glucophage 500 mg twice daily prior to admission.  Resume as needed CBG (last 3)  Recent Labs    11/02/19 1640 11/02/19 2105 11/03/19 0609  GLUCAP 107* 172* 110*  Controlled 3/16  3/14- good control- con't meds 12.  Hyperlipidemia.  Lipitor 13. Constipation:   3/13 Type 6 BM  14. Pleural effusions:stable sized  15. Disposition: Lives with wife who is at home and will be able to help him. Has 3 daughters whom he is very close with who also live nearby.      LOS: 10 days A FACE TO FACE EVALUATION WAS PERFORMED  Charlett Blake 11/03/2019, 8:24 AM

## 2019-11-03 NOTE — Progress Notes (Signed)
Occupational Therapy Session Note  Patient Details  Name: [name] [name] MRN: [id number] Date of Birth: October 31, 1945  Today's Date: 11/03/2019 OT Individual Time: 1050-1200 OT Individual Time Calculation (min): 70 min    Short Term Goals: Week 2:  OT Short Term Goal 1 (Week 2): Pt will be able to ambulate to bathroom with CGA and use of the RW for support. OT Short Term Goal 2 (Week 2): Pt will complete all dressing with no more than min assist from the EOB/. OT Short Term Goal 3 (Week 2): Pt will complete LB dressing sit to stand with min guard assist. OT Short Term Goal 4 (Week 2): Pt will complete bathing and dressing tasks with no more than two questioning cues.  Skilled Therapeutic Interventions/Progress Updates:    Pt completed shower, dressing, and grooming tasks during session.  He was able to ambulate with min assist, and one LOB posteriorly, into the bathroom to the 3:1.  He then doffed clothing at min assist level and transferred over to the tub bench.  He completed all bathing sit to stand with min assist and min questioning cueing.  He then transferred to the wheelchair with min assist using the RW for support to work on dressing.  He completed UB dressing with supervision and the completed LB dressing with min assist excluding TEDs.  He stood to complete oral hygiene with min guard assist, but with increased knee flexion noted secondary to motor impersistence.  After completion of dressing, therapist took him down to the tub room where he was educated and practiced tub shower transfers.  He was able to complete the transfer in and out of the tub with use of the tub bench and min assist.  Finished session with return to the room and pt left in the wheelchair with call button and phone in reach and safety belt in place.    Therapy Documentation Precautions:  Precautions Precautions: Fall Restrictions Weight Bearing Restrictions: No  Pain: Pain Assessment Pain Scale: Faces Pain  Score: 0-No pain ADL: See Care Tool Section for some details of selfcare and mobility Therapy/Group: Individual Therapy  Janese Radabaugh,[place] OTR/L 11/03/2019, 12:10 PM

## 2019-11-04 ENCOUNTER — Inpatient Hospital Stay (HOSPITAL_COMMUNITY): Payer: Medicare Other | Admitting: Occupational Therapy

## 2019-11-04 ENCOUNTER — Inpatient Hospital Stay (HOSPITAL_COMMUNITY): Payer: Medicare Other | Admitting: Speech Pathology

## 2019-11-04 ENCOUNTER — Inpatient Hospital Stay (HOSPITAL_COMMUNITY): Payer: Medicare Other | Admitting: Physical Therapy

## 2019-11-04 LAB — GLUCOSE, CAPILLARY
Glucose-Capillary: 106 mg/dL — ABNORMAL HIGH (ref 70–99)
Glucose-Capillary: 137 mg/dL — ABNORMAL HIGH (ref 70–99)
Glucose-Capillary: 126 mg/dL — ABNORMAL HIGH (ref 70–99)
Glucose-Capillary: 124 mg/dL — ABNORMAL HIGH (ref 70–99)

## 2019-11-04 MED ORDER — MAGNESIUM CITRATE PO SOLN
1.0000 | Freq: Once | ORAL | Status: AC
  Administered 2019-11-04: 1 via ORAL
  Filled 2019-11-04: qty 296

## 2019-11-04 MED ORDER — SENNOSIDES-DOCUSATE SODIUM 8.6-50 MG PO TABS
2.0000 | ORAL_TABLET | Freq: Two times a day (BID) | ORAL | Status: DC
  Administered 2019-11-04 – 2019-11-11 (×11): 2 via ORAL
  Filled 2019-11-04 (×13): qty 2

## 2019-11-04 NOTE — Progress Notes (Signed)
Occupational Therapy Session Note  Patient Details  Name: [name] [name] MRN: [id number] Date of Birth: [birth year]  Today's Date: 11/04/2019 OT Individual Time: [id number] Individual Time Calculation (min): 43 min    Short Term Goals: Week 2:  OT Short Term Goal 1 (Week 2): Pt will be able to ambulate to bathroom with CGA and use of the RW for support. OT Short Term Goal 2 (Week 2): Pt will complete all dressing with no more than min assist from the EOB/. OT Short Term Goal 3 (Week 2): Pt will complete LB dressing sit to stand with min guard assist. OT Short Term Goal 4 (Week 2): Pt will complete bathing and dressing tasks with no more than two questioning cues.  Skilled Therapeutic Interventions/Progress Updates:    Pt completed transfer from the bed to the wheelchair with min assist stand pivot to begin session.  Therapist then took him down to the dayroom where he worked in sitting on cognitive task of solving simple PVC pipe puzzle.  He was instructed to reproduce a simple eleven piece design from a diagram given.  He needed max instructional cueing for solving the puzzle with increased time.  Returned to the room at conclusion of task with pt completing min assist transfer to the bathroom for toileting task.  He was not successful, but was able to complete clothing management at min assist.  Finished session with pt in the wheelchair with spouse present and call button and phone in reach.  Safety belt in place as well.   Therapy Documentation Precautions:  Precautions Precautions: Fall Restrictions Weight Bearing Restrictions: No   Pain: Pain Assessment Pain Scale: Faces Pain Score: 0-No pain ADL: See Care Tool Section for some details of mobility and selfcare  Therapy/Group: Individual Therapy  Elgie Landino,[place] OTR/L 11/04/2019, 4:50 PM

## 2019-11-04 NOTE — Progress Notes (Signed)
Speech Language Pathology Daily Session Note  Patient Details  Name: [name] [name] MRN: [id number] Date of Birth: August 16, 1946  Today's Date: 11/04/2019 SLP Individual Time: [id number] SLP Individual Time Calculation (min): 56 min  Short Term Goals: Week 2: SLP Short Term Goal 1 (Week 2): Pt will utilize external aids to recall mildly complex daily information with min assist verbal cues. SLP Short Term Goal 2 (Week 2): Pt will complete mildly complex functional tasks with min assist for functional problem solving. SLP Short Term Goal 3 (Week 2): Pt will recognize and correct errors during mildly complex functional tasks with min assist verbal cues. SLP Short Term Goal 4 (Week 2): Pt will sustain his attention to functional tasks for 10 minute intervals with min verbal cues for redirection.  Skilled Therapeutic Interventions:  Skilled treatment session targeted cognition goals. SLP facilitated session by providing Mod A question cues to recall daily information. Use of visualization was not helpful in recall of therapy activities. SLP reviewed OT and PT notes. Pt able to accurately answer yes/no questions related to activity provided. Pt also attempted to utilize external memory aids to recall information regarding his sister's plan of care. He continued to have vague questions as to what "everything meant."  Palliative Care Nurse to contact pt with information. Pt will prolonged time and inability to complete thoughts. SLP provided category cues, multiple choice cues and semantic cues to aid in retrieval of thought. These strategies were ineffective. Pt left upright in bed, bed alarm on and all needs within reach. Continue per current plan of care.        Pain Pain Assessment Pain Scale: Faces Pain Score: 0-No pain  Therapy/Group: Individual Therapy  [name] [name] 11/04/2019, 1:29 PM

## 2019-11-04 NOTE — Progress Notes (Signed)
[facility] PHYSICAL MEDICINE & REHABILITATION PROGRESS NOTE   Subjective/Complaints:  Pt remembered my name without cueing  No other issues except chronic constipation, thinks he used Mg Citrae every few days at home  Last recorded BM 3/13  ROS-   Pt denies SOB, abd pain, CP, N/V/C/D, and vision changes     Objective:   No results found. No results for input(s): WBC, HGB, HCT, PLT in the last 72 hours. Recent Labs    11/02/19 1043  NA 140  K 4.2  CL 107  CO2 24  GLUCOSE 196*  BUN 13  CREATININE 1.30*  CALCIUM 9.3    Intake/Output Summary (Last 24 hours) at 11/04/2019 0844 Last data filed at 11/03/2019 1915 Gross per 24 hour  Intake 476 ml  Output --  Net 476 ml     Physical Exam: Vital Signs Blood pressure 102/60, pulse 60, temperature 98.4 F (36.9 C), temperature source Oral, resp. rate 18, height _0  (1.778 m), weight 81.8 kg, SpO2 99 %.       General: No acute distress Mood and affect are appropriate Heart: Regular rate and rhythm no rubs murmurs or extra sounds Lungs: Clear to auscultation, breathing unlabored, no rales or wheezes Abdomen: Positive bowel sounds, soft nontender to palpation, nondistended Extremities: No clubbing, cyanosis, or edema Skin: No evidence of breakdown, no evidence of rash Neurologic: Cranial nerves II through XII intact, motor strength is 5/5 in bilateral deltoid, bicep, tricep, grip, hip flexor, knee extensors, ankle dorsiflexor and plantar flexor  Musculoskeletal: Full range of motion in all 4 extremities. No joint swelling    Assessment/Plan: 1. 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  Physiatrist is providing close team supervision and 24 hour management of active medical problems listed  below.  Physiatrist and rehab team continue to assess barriers to discharge/monitor patient progress toward functional and medical goals  Care Tool:  Bathing    Body parts bathed by patient: Right arm, Left arm, Chest, Abdomen, Front perineal area, Right upper leg, Left upper leg, Face, Right lower leg, Left lower leg, Buttocks   Body parts bathed by helper: Buttocks     Bathing assist Assist Level: Minimal Assistance - Patient > 75%     Upper Body Dressing/Undressing Upper body dressing   What is the patient wearing?: Pull over shirt    Upper body assist Assist Level: Supervision/Verbal cueing    Lower Body Dressing/Undressing Lower body dressing      What is the patient wearing?: Incontinence brief, Pants     Lower body assist Assist for lower body dressing: Minimal Assistance - Patient > 75%     Toileting Toileting    Toileting assist Assist for toileting: Minimal Assistance - Patient > 75% Assistive Device Comment: Urinal   Transfers Chair/bed transfer  Transfers assist     Chair/bed transfer assist level: Minimal Assistance - Patient > 75%     Locomotion Ambulation   Ambulation assist      Assist level: Minimal Assistance - Patient > 75% Assistive device: Walker-rolling Max distance: 10'   Walk 10 feet activity   Assist  Walk 10 feet activity did not occur: Safety/medical concerns(decreased balance/activity tolerance)  Assist level: Total Assistance - Patient < 25%(minA from therapist, pt not un-weight/used lite gait on treadmill for safety) Assistive device: Lite  Gait   Walk 50 feet activity   Assist Walk 50 feet with 2 turns activity did not occur: Safety/medical concerns(decreased balance/activity tolerance)  Assist level: Total Assistance - Patient < 25%(minA from therapist, pt not un-weight/used lite gait on treadmill for safety) Assistive device: Lite Gait    Walk 150 feet activity   Assist Walk 150 feet activity did not  occur: Safety/medical concerns(decreased balance/activity tolerance)  Assist level: Total Assistance - Patient < 25%(minA from therapist, pt not un-weight/used lite gait on treadmill for safety) Assistive device: Lite Gait    Walk 10 feet on uneven surface  activity   Assist Walk 10 feet on uneven surfaces activity did not occur: Safety/medical concerns(decreased balance/activity tolerance)         Wheelchair     Assist   Type of Wheelchair: Manual    Wheelchair assist level: Supervision/Verbal cueing Max wheelchair distance: 89f    Wheelchair 50 feet with 2 turns activity    Assist        Assist Level: Supervision/Verbal cueing   Wheelchair 150 feet activity     Assist      Assist Level: Maximal Assistance - Patient 25 - 49%   Blood pressure 102/60, pulse 60, temperature 98.4 F (36.9 C), temperature source Oral, resp. rate 18, height _0  (1.778 m), weight 81.8 kg, SpO2 99 %.  Medical Problem List and Plan: 1.  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 Formulized individual treatment plan based on medical history, underlying problem and comorbidities.      2.  Antithrombotics: -DVT/anticoagulation/pulmonary emboli: Eliquis             -antiplatelet therapy: N/A 3. Pain Management: Neurontin 300 mg nightly, Lidoderm patch. Well controlled.  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 Mood: Provide emotional support             -antipsychotic agents: N/A 5. Neuropsych: This patient is capable of making decisions on his own behalf. 6. Skin/Wound Care:  Routine skin checks 7. Fluids/Electrolytes/Nutrition: Routine in and outs with follow-up chemistries 8.  Acute blood loss anemia.  Hgb stable on 3/7 9.  Hypertension.  Norvasc 10 mg daily, Coreg 25 mg twice daily, hydralazine as directed. Well controlled.  Vitals:   11/04/19 0523 11/04/19 0831  BP: 121/72 102/60  Pulse: 60 60  Resp: 18   Temp: 98.4 F (36.9 C)   SpO2: 99%   controlled  3/17  Asymptomatic brady  10.  BPH.  Flomax 11.  Diabetes mellitus.  Hemoglobin A1c 6.7.  SSI.  Patient on Glucophage 500 mg twice daily prior to admission.  Resume as needed CBG (last 3)  Recent Labs    11/03/19 1650 11/03/19 2059 11/04/19 0635  GLUCAP 121* 122* 106*  controlled 3/17  3/14- good control- con't meds 12.  Hyperlipidemia.  Lipitor 13. Constipation:   3/13 Type 6 BM , increase senna to 2 po BID  14. Pleural effusions:stable sized  15. Disposition: Lives with wife who is at home and will be able to help him. Has 3 daughters whom he  is very close with who also live nearby.      LOS: 11 days A FACE TO Reasnor E Daymian Lill 11/04/2019, 8:44 AM

## 2019-11-04 NOTE — Progress Notes (Signed)
Occupational Therapy Session Note  Patient Details  Name: [name] [name] MRN: [id number] Date of Birth: 1945/12/17  Today's Date: 11/04/2019 OT Individual Time: 0904-1000 OT Individual Time Calculation (min): 56 min    Short Term Goals: Week 2:  OT Short Term Goal 1 (Week 2): Pt will be able to ambulate to bathroom with CGA and use of the RW for support. OT Short Term Goal 2 (Week 2): Pt will complete all dressing with no more than min assist from the EOB/. OT Short Term Goal 3 (Week 2): Pt will complete LB dressing sit to stand with min guard assist. OT Short Term Goal 4 (Week 2): Pt will complete bathing and dressing tasks with no more than two questioning cues.  Skilled Therapeutic Interventions/Progress Updates:    Pt in bed to start session, completed transfer to the EOB with supervision to work on LB dressing.  He was able to donn a pullover shirt with supervision unsupported and then needed min assist for donning pants sit to stand.  Increased LOB posteriorly noted with standing.  He donned his slip on shoes with setup as well.  Next, he transferred over to the sink with min assist using the RW for support and worked on part of oral hygiene with min assist standing.  Increased trunk flexion noted in standing with the need to sit down in the wheelchair to complete this.  Once complete, took pt down to the ortho gym where he worked on standing balance while engaged in use of the [place].  He demonstrated an average time of 3 seconds for 1 minute intervals.  No consistent pattern noted with being slower in one area vs the other as it was variable.  He exhibited LOB posteriorly X2 requiring min assist for balance with 2 intervals and then he was able to maintain standing with close supervision for the last two intervals.  Finished session with functional mobility in the hallway back to the room for approximately 100' with min assist.  Finished session with transport back to the room via  wheelchair with pt left sitting in the wheelchair with call button and phone in reach and safety belt in place.    Therapy Documentation Precautions:  Precautions Precautions: Fall Restrictions Weight Bearing Restrictions: No  Pain: Pain Assessment Pain Scale: Faces Pain Score: 0-No pain Faces Pain Scale: No hurt ADL: See Care Tool Section for some details of selfcare and mobility  Therapy/Group: Individual Therapy  Dasja Brase,[place] OTR/L 11/04/2019, 10:34 AM

## 2019-11-04 NOTE — Patient Care Conference (Signed)
Inpatient RehabilitationTeam Conference and Plan of Care Update Date: 11/04/2019   Time: 10:45 AM    Patient Name: [name] [name]      Medical Record Number: [id number]  Date of Birth: September 22, 1945 Sex: Male         Room/Bed: 4W21C/4W21C-01 Payor Info: Payor: [employer] MEDICARE / Plan: 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 History of repair of dissecting thoracic aneurysm   . Abdominal distension   . Pulmonary embolus ([place]) 10/19/2019  . CVA (cerebral vascular accident) ([place]) 10/19/2019  . AKI (acute kidney injury) ([place]) 10/19/2019  . 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 Ileus ([place]) 10/19/2019  . 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 Radiculopathy 12/15/2014    Expected Discharge Date: Expected Discharge Date: 11/13/19  Team Members Present: Physician leading conference: Dr. Alysia Penna Care Coodinator Present: Nestor Lewandowsky, RN, BSN, CRRN;Genie Shade Rivenbark, RN, MSN Nurse Present: Ellison Carwin, LPN PT Present: Michaelene Song, PT OT Present: Clyda Greener, OT SLP Present: Jettie Booze, CF-SLP PPS Coordinator present : Gunnar Fusi, SLP     Current Status/Progress Goal Weekly Team Focus  Bowel/Bladder   Occassional incontinent episode, LBM 3/16  Continent bowel and bladder, consistent BMs.  PRN/QS toileting assessment   Swallow/Nutrition/ Hydration             ADL's   Supervision for UB bathing and dressing with min assist for LB bathing and dressing as well as for functional transfers with use of the RW for support.  supervision overall  selfcare  retraining, balance retraining, transfer retraining, DME education, neuromuscular re-education   Mobility   supervision bed mobility, minA stand pivot transfer with and without RW, minA gait up to 190' with RW, minA and B rails for 4 steps  independent bed mobility, supervision transfers, supervision-CGA gait and stairs with LRAD  standing balance, posterior chain LE strengthening, transfers, gait and stair training, patient/caregiver edu, initiation and sequencing, coordination   Communication             Safety/Cognition/ Behavioral Observations  Slow processing, Mod-Max A basic problem solving, emergent awareness, Min A susatined attention, Min-Mod recall  Supervision A - likely need to downgrade  basic functional problem solving, error awareness, recall with strategies, sustained attention   Pain   No reports of pain, lidoderm patch on back  No pain.  PRN/QS pain assement   Skin   Skin intact, no evidence of breakdown  No further skin breakdown  QS/PRN assessment of skin    Rehab Goals Patient on target to meet rehab goals: Yes *See Care Plan and progress notes for long and short-term goals.     Barriers to Discharge  Current Status/Progress Possible Resolutions Date Resolved   Nursing                  PT                    OT                  SLP  SW Home environment access/layout;Decreased caregiver support Home with wife, limited access to B+B            Discharge Planning/Teaching Needs:  Home with family  Transfers, toileting, medications, etc   Team Discussion: MD med stable.  RN cont/inc, mild confusion.  OT S UB B/D, transfers min A walker, LB B/D min A, sit to stand min/mod, posterior LOB.  PT min/mod amb 190' walker, treadmill yesterday, goals are on track.  SLP slow processing, mod/max basic prob solving and recall.  Wife has been in.   Revisions to Treatment Plan: N/A     Medical Summary Current Status: double vision resolved but still has  posterior lean Weekly Focus/Goal: D/C planning, work on endurance  Barriers to Discharge: Medical stability   Possible Resolutions to Barriers: Cont rehab, slow processing and problem solving   Continued Need for Acute Rehabilitation Level of Care: The patient requires daily medical management by a physician with specialized training in physical medicine and rehabilitation for the following reasons: Direction of a multidisciplinary physical rehabilitation program to maximize functional independence : Yes Medical management of patient stability for increased activity during participation in an intensive rehabilitation regime.: Yes Analysis of laboratory values and/or radiology reports with any subsequent need for medication adjustment and/or medical intervention. : Yes   I attest that I was present, lead the team conference, and concur with the assessment and plan of the team.   Retta Diones 11/04/2019, 10:23 PM   Team conference was held via web/ teleconference due to [place] - 19

## 2019-11-04 NOTE — Progress Notes (Signed)
Physical Therapy Session Note  Patient Details  Name: [name] [name] MRN: [id number] Date of Birth: June 06, 1946  Today's Date: 11/04/2019 PT Individual Time: 1037-1105 PT Individual Time Calculation (min): 28 min   Short Term Goals: Week 2:  PT Short Term Goal 1 (Week 2): STG = LTG d/t ELOS  Skilled Therapeutic Interventions/Progress Updates: Pt presented in w/c agreeable to therapy. Pt denies pain at start of session states some LBP at end of session but no intervention requested. Pt transported to day room for energy conservation and time management and ambulated 265f to rehab gym with RW and CGA. Performed STS 2 x 5 no AD for BLE strengthening and balance. Performed ball toss with rebounder x 10 for dynamic balance with noted some posterior lean with fatigue. Pt transported back to room in w/c at end of session and left in w/c with belt alarm on, call bell within reach and needs met.      Therapy Documentation Precautions:  Precautions Precautions: Fall Restrictions Weight Bearing Restrictions: No General:   Vital Signs: Therapy Vitals Pulse Rate: 60 BP: 102/60 Patient Position (if appropriate): Sitting Pain: Pain Assessment Pain Scale: Faces Pain Score: 0-No pain Faces Pain Scale: No hurt    Therapy/Group: Individual Therapy  Mysha Peeler  Armel Rabbani, PTA  11/04/2019, 11:11 AM

## 2019-11-05 ENCOUNTER — Inpatient Hospital Stay (HOSPITAL_COMMUNITY): Payer: Medicare Other | Admitting: Speech Pathology

## 2019-11-05 ENCOUNTER — Inpatient Hospital Stay (HOSPITAL_COMMUNITY): Payer: Medicare Other | Admitting: Occupational Therapy

## 2019-11-05 ENCOUNTER — Inpatient Hospital Stay (HOSPITAL_COMMUNITY): Payer: Medicare Other | Admitting: Physical Therapy

## 2019-11-05 LAB — GLUCOSE, CAPILLARY
Glucose-Capillary: 127 mg/dL — ABNORMAL HIGH (ref 70–99)
Glucose-Capillary: 135 mg/dL — ABNORMAL HIGH (ref 70–99)
Glucose-Capillary: 127 mg/dL — ABNORMAL HIGH (ref 70–99)
Glucose-Capillary: 114 mg/dL — ABNORMAL HIGH (ref 70–99)

## 2019-11-05 NOTE — Progress Notes (Signed)
Team Conference Report to Patient/Family  Team Conference discussion was reviewed with the patient and caregiver, including goals for supervision to min assist due to post lean, any changes in plan of care and target discharge date.  Patient and caregiver express understanding and are in agreement.  The patient has a target discharge date of 11/13/19. Family education set up for 11/12/19 at 1p with the wife.  [name] B 11/05/2019, 3:36 PM

## 2019-11-05 NOTE — Progress Notes (Signed)
Occupational Therapy Session Note  Patient Details  Name: [name] [name] MRN: [id number] Date of Birth: [birth year]  Today's Date: 11/05/2019 OT Individual Time: 1301-1330 OT Individual Time Calculation (min): 29 min    Short Term Goals: Week 2:  OT Short Term Goal 1 (Week 2): Pt will be able to ambulate to bathroom with CGA and use of the RW for support. OT Short Term Goal 2 (Week 2): Pt will complete all dressing with no more than min assist from the EOB/. OT Short Term Goal 3 (Week 2): Pt will complete LB dressing sit to stand with min guard assist. OT Short Term Goal 4 (Week 2): Pt will complete bathing and dressing tasks with no more than two questioning cues.  Skilled Therapeutic Interventions/Progress Updates:    Pt worked on shaving during session while sitting at the sink.  He was able to sequence task of washing his face, applying shaving cream, and completing shaving without cueing from therapist.  He did need cueing to completely wipe off the shaving cream from behind his left ear.  He also completed oral hygiene with setup assist as well in sitting.  Finished session with donning of his pullover shirt with setup assistance.  Pt left with nursing at end of session and pt still sitting up in the wheelchair.    Therapy Documentation Precautions:  Precautions Precautions: Fall Restrictions Weight Bearing Restrictions: No  Pain: Pain Assessment Pain Scale: Faces Pain Score: 0-No pain ADL: See Care Tool Section for some details of mobility and selfcare  Therapy/Group: Individual Therapy  Aariyana Manz,[name] [name] 11/05/2019, 3:49 PM

## 2019-11-05 NOTE — Progress Notes (Signed)
Occupational Therapy Session Note  Patient Details  Name: [name] [name] MRN: [id number] Date of Birth: 25-Jul-1946  Today's Date: 11/05/2019 OT Individual Time: 1002-1058 OT Individual Time Calculation (min): 56 min    Short Term Goals: Week 2:  OT Short Term Goal 1 (Week 2): Pt will be able to ambulate to bathroom with CGA and use of the RW for support. OT Short Term Goal 2 (Week 2): Pt will complete all dressing with no more than min assist from the EOB/. OT Short Term Goal 3 (Week 2): Pt will complete LB dressing sit to stand with min guard assist. OT Short Term Goal 4 (Week 2): Pt will complete bathing and dressing tasks with no more than two questioning cues.  Skilled Therapeutic Interventions/Progress Updates:    Pt completed shower and dressing during session.  He needed min assist for static sitting balance secondary to increased LOB posteriorly.  He then completed functional transfer to the walk-in shower with min assist using the RW.  He then worked on bathing sit to stand with min assist and min instructional cueing.  Once complete, he was able to transfer out to the wheelchair but needed mod assist secondary to demonstrating LOB posteriorly.  He was able to sequence through dressing tasks with min questioning cueing as well as min assist.  He was able to complete grooming tasks in sitting as well with supervision.  Finished session with call button in reach and safety belt in place.    Therapy Documentation Precautions:  Precautions Precautions: Fall Restrictions Weight Bearing Restrictions: No   Pain: Pain Assessment Pain Scale: Faces Pain Score: 0-No pain ADL: See Care Tool Section for some details of mobility and selfcare  Therapy/Group: Individual Therapy  Kimla Furth,[place] OTR/L 11/05/2019, 12:10 PM

## 2019-11-05 NOTE — Progress Notes (Signed)
Physical Therapy Session Note  Patient Details  Name: [name] [name] MRN: [id number] Date of Birth: 03/10/1946  Today's Date: 11/05/2019 PT Individual Time: 0805-0906 PT Individual Time Calculation (min): 61 min   Short Term Goals: Week 2:  PT Short Term Goal 1 (Week 2): STG = LTG d/t ELOS  Skilled Therapeutic Interventions/Progress Updates:   Pt received supine in bed with RN present for morning rounds and pt agreeable to therapy session. Pt noted to be wearing TED hose - educated pt on removing at night and only wearing during the day. Supine>sitting R EOB, HOB partially elevated and using bedrails, with supervision and increased time/effort. Pt reporting need to use bathroom. Sit>stand EOB>RW with CGA/min assist for steadying and cuing for anterior weight shift to decrease posterior lean/LOB. Gait ~69ft x2 in/out of bathroom using RW with CGA for steadying - demonstrates slow gait speed with slow movements, decreased B LE step length, and forward trunk flexion. Sit<>stand on/off toilet using grab bar with CGA for steadying on descent and min assist for lifting into standing. Standing with CGA performed LB clothing management with min assist. Pt continent of BM and performed standing peri-care with set-up assist and CGA for steadying. Sitting EOB donned shirt with set-up then pants and shoes with max assist for time management. Gait training ~161ft to main therapy gym using RW with CGA for steadying - continues to demonstrate decreased speed of movement, decreased gait speed, decreased B LE step length, increase hip/knee flexion during stance, and R hip abductor weakness with L hip drop during R stance - cuing throughout for improvement. Sit<>supine on mat table with supervision and increased time/effort. Performed supine bridging x10 reps then bridging with level 2 theraband around knees to promote increased hip abductor activation 2set of 15 reps - cuing and manual facilitation for improved  form/technique with pt demonstrating moderate hip clearance from mat. Sit>stand EOM>RW with CGA/min assist for balance due to posterior lean/LOB upon coming to stand - returned to sitting and performed again with increased cuing for pushing from mat and increased anterior trunk flexion and weight shift with pt demonstrating improved balance. Standing with RW and mirror feedback performed 2sets of 12reps of heel raises with CGA for steadying - pt only minimally able to lift heels. Gait training ~117ft back to room using RW with CGA progressed to min assist for steadying due to increasing pt fatigue requiring 3 standing rest breaks prior to making it back to his room - the above gait impairments became more pronounced with fatigue and required increased cuing for correction. Pt left seat in w/c with needs in reach and chair alarm on.  Therapy Documentation Precautions:  Precautions Precautions: Fall Restrictions Weight Bearing Restrictions: No  Pain:  Denies pain during session.   Therapy/Group: Individual Therapy  Tawana Scale, PT, DPT 11/05/2019, 7:49 AM

## 2019-11-05 NOTE — Progress Notes (Signed)
Speech Language Pathology Daily Session Note  Patient Details  Name: [name] [name] MRN: [id number] Date of Birth: Nov 24, 1945  Today's Date: 11/05/2019 SLP Individual Time: 1350-1450 SLP Individual Time Calculation (min): 60 min  Short Term Goals: Week 2: SLP Short Term Goal 1 (Week 2): Pt will utilize external aids to recall mildly complex daily information with min assist verbal cues. SLP Short Term Goal 2 (Week 2): Pt will complete mildly complex functional tasks with min assist for functional problem solving. SLP Short Term Goal 3 (Week 2): Pt will recognize and correct errors during mildly complex functional tasks with min assist verbal cues. SLP Short Term Goal 4 (Week 2): Pt will sustain his attention to functional tasks for 10 minute intervals with min verbal cues for redirection.  Skilled Therapeutic Interventions: Skilled treatment session focused on cognitive-linguistic goals. SLP facilitated session by providing Min A verbal cues for sustained attention to a verbal description task. Patient demonstrated appropriate word-finding throughout task but required overall Min A verbal cues to complete his thoughts. Patient left upright in bed with alarm on and all needs within reach. Continue with current plan of care.      Pain Pain Assessment Pain Scale: Faces Pain Score: 0-No pain  Therapy/Group: Individual Therapy  [name] [name] 11/05/2019, 2:55 PM

## 2019-11-05 NOTE — Progress Notes (Signed)
Patient has refused/ not worn this BiPAP since hospital admission.

## 2019-11-06 ENCOUNTER — Inpatient Hospital Stay (HOSPITAL_COMMUNITY): Payer: Medicare Other | Admitting: *Deleted

## 2019-11-06 ENCOUNTER — Inpatient Hospital Stay (HOSPITAL_COMMUNITY): Payer: Medicare Other | Admitting: Physical Therapy

## 2019-11-06 ENCOUNTER — Inpatient Hospital Stay (HOSPITAL_COMMUNITY): Payer: Medicare Other | Admitting: Occupational Therapy

## 2019-11-06 LAB — GLUCOSE, CAPILLARY
Glucose-Capillary: 111 mg/dL — ABNORMAL HIGH (ref 70–99)
Glucose-Capillary: 119 mg/dL — ABNORMAL HIGH (ref 70–99)
Glucose-Capillary: 135 mg/dL — ABNORMAL HIGH (ref 70–99)
Glucose-Capillary: 108 mg/dL — ABNORMAL HIGH (ref 70–99)

## 2019-11-06 MED ORDER — MAGNESIUM CITRATE PO SOLN
1.0000 | Freq: Once | ORAL | Status: AC
  Administered 2019-11-06: 1 via ORAL
  Filled 2019-11-06: qty 296

## 2019-11-06 NOTE — Progress Notes (Signed)
Occupational Therapy Session Note  Patient Details  Name: [name] [name] MRN: [id number] Date of Birth: July 12, 1946  Today's Date: 11/06/2019 OT Individual Time: [id number] OT Individual Time Calculation (min): 58 min    Short Term Goals: Week 2:  OT Short Term Goal 1 (Week 2): Pt will be able to ambulate to bathroom with CGA and use of the RW for support. OT Short Term Goal 2 (Week 2): Pt will complete all dressing with no more than min assist from the EOB/. OT Short Term Goal 3 (Week 2): Pt will complete LB dressing sit to stand with min guard assist. OT Short Term Goal 4 (Week 2): Pt will complete bathing and dressing tasks with no more than two questioning cues.  Skilled Therapeutic Interventions/Progress Updates:    Pt in bed to start session with transfer to the EOB with supervision for dressing tasks.  He was able to complete UB dressing with setup for pullover shirt as well as min assist for donning new brief and pants.  He completed peri care as well in standing secondary to the brief he had on was slightly used.  Therapist assisted with donning TEDs and then he was able to donn his slip on shoes with setup as well.  No posterior LOB noted in sitting but he did exhibit frequent LOB posteriorly with standing during LB selfcare as well as standing tasks in the gym.  Mod instructional cueing was needed for hand placement on the bed or chair arms for sit to stand instead of pulling up on the walker.  He was able to complete oral hygiene, washing his face, and brushing his hair with setup assist from seated position.  For next part of session he was taken via wheelchair to the therapy gym with transfer over to the mat with min assist using the RW.  Had pt work on [employer] in standing.  Mod assist for standing balance secondary to posterior lean.  Pt with significant difficulty with anticipatory awareness to stop therapist from getting four in a row.  Therapist and pt completed 3 trials of  game and therapist placed his pieces in the same vertical position and gave mod to max questioning cues and pt could not stop therapist from winning the games.  Pt aware and able to state that he was having a tough time with this.  Returned to the wheelchair at end of session and back to the room.  Pt left sitting up in the wheelchair with call button and phone in reach.   Therapy Documentation Precautions:  Precautions Precautions: Fall Restrictions Weight Bearing Restrictions: No  Pain: Pain Assessment Pain Scale: Faces Pain Score: 0-No pain ADL: See Care Tool Section for some details of mobility and selfcare  Therapy/Group: Individual Therapy  Nivea Wojdyla,[place] OTR/L 11/06/2019, 12:34 PM

## 2019-11-06 NOTE — Progress Notes (Signed)
Speech Language Pathology Daily Session Note  Patient Details  Name: Jayquon Sharrett Badalamenti MRN: [id number] Date of Birth: 1945/12/15  Today's Date: 11/06/2019 SLP Individual Time: 1016-1055 SLP Individual Time Calculation (min): 39 min  Short Term Goals: Week 2: SLP Short Term Goal 1 (Week 2): Pt will utilize external aids to recall mildly complex daily information with min assist verbal cues. SLP Short Term Goal 2 (Week 2): Pt will complete mildly complex functional tasks with min assist for functional problem solving. SLP Short Term Goal 3 (Week 2): Pt will recognize and correct errors during mildly complex functional tasks with min assist verbal cues. SLP Short Term Goal 4 (Week 2): Pt will sustain his attention to functional tasks for 10 minute intervals with min verbal cues for redirection.  Skilled Therapeutic Interventions:  Skilled treatment session targeted cognition goals. SLP requested to him facilitate pt's comprehension of information as it was presented by MD treating pt's sister. SLP provided Min A cues for pt to comprehend and ask questions related to his sister's recent passing this morning. SLP assisted MD and pt with establishing plan. SLP provided Mod A for pt to call his wife who was leaving for the hospital but was unaware of sister's passing. SLP waited with pt until his wife arrived.      Pain    Therapy/Group: Individual Therapy  Jerri Hargadon 11/06/2019, 5:04 PM

## 2019-11-06 NOTE — Progress Notes (Signed)
[place] PHYSICAL MEDICINE & REHABILITATION PROGRESS NOTE   Subjective/Complaints:  No issues overnite remains constipation , no abd pain   ROS-   Pt denies SOB, abd pain, CP, N/V/C/D, and vision changes     Objective:   No results found. No results for input(s): WBC, HGB, HCT, PLT in the last 72 hours. No results for input(s): NA, K, CL, CO2, GLUCOSE, BUN, CREATININE, CALCIUM in the last 72 hours.  Intake/Output Summary (Last 24 hours) at 11/06/2019 0838 Last data filed at 11/06/2019 0834 Gross per 24 hour  Intake 578 ml  Output --  Net 578 ml     Physical Exam: Vital Signs Blood pressure 110/74, pulse (!) 58, temperature 99.4 F (37.4 C), temperature source Oral, resp. rate 18, height 5\' 10"  (1.778 m), weight 81.8 kg, SpO2 97 %.       General: No acute distress Mood and affect are appropriate Heart: Regular rate and rhythm no rubs murmurs or extra sounds Lungs: Clear to auscultation, breathing unlabored, no rales or wheezes Abdomen: Positive bowel sounds, soft nontender to palpation, nondistended Extremities: No clubbing, cyanosis, or edema Skin: No evidence of breakdown, no evidence of rash Neurologic: Cranial nerves II through XII intact, motor strength is 5/5 in bilateral deltoid, bicep, tricep, grip, hip flexor, knee extensors, ankle dorsiflexor and plantar flexor  Musculoskeletal: Full range of motion in all 4 extremities. No joint swelling    Assessment/Plan: 1. 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  Physiatrist is providing close team supervision and 24 hour management of active medical problems listed below.  Physiatrist and rehab team continue to assess barriers to discharge/monitor patient progress toward functional and  medical goals  Care Tool:  Bathing    Body parts bathed by patient: Right arm, Left arm, Chest, Abdomen, Front perineal area, Right upper leg, Left upper leg, Face, Right lower leg, Left lower leg, Buttocks   Body parts bathed by helper: Buttocks     Bathing assist Assist Level: Minimal Assistance - Patient > 75%     Upper Body Dressing/Undressing Upper body dressing   What is the patient wearing?: Pull over shirt    Upper body assist Assist Level: Supervision/Verbal cueing    Lower Body Dressing/Undressing Lower body dressing      What is the patient wearing?: Pants     Lower body assist Assist for lower body dressing: Minimal Assistance - Patient > 75%     Toileting Toileting    Toileting assist Assist for toileting: Minimal Assistance - Patient > 75% Assistive Device Comment: Urinal   Transfers Chair/bed transfer  Transfers assist     Chair/bed transfer assist level: Minimal Assistance - Patient > 75%     Locomotion Ambulation   Ambulation assist      Assist level: Minimal Assistance - Patient > 75% Assistive device: Walker-rolling Max distance: 189ft   Walk 10 feet activity   Assist  Walk 10 feet activity did not occur: Safety/medical concerns(decreased balance/activity tolerance)  Assist level: Minimal Assistance - Patient > 75% Assistive device: Walker-rolling   Walk 50 feet activity   Assist Walk 50 feet with 2 turns activity did not occur: Safety/medical concerns(decreased balance/activity tolerance)  Assist level: Minimal Assistance - Patient > 75% Assistive device: Walker-rolling    Walk 150 feet activity   Assist Walk  150 feet activity did not occur: Safety/medical concerns(decreased balance/activity tolerance)  Assist level: Minimal Assistance - Patient > 75% Assistive device: Walker-rolling    Walk 10 feet on uneven surface  activity   Assist Walk 10 feet on uneven surfaces activity did not occur: Safety/medical  concerns(decreased balance/activity tolerance)         Wheelchair     Assist   Type of Wheelchair: Manual    Wheelchair assist level: Supervision/Verbal cueing Max wheelchair distance: 34ft    Wheelchair 50 feet with 2 turns activity    Assist        Assist Level: Supervision/Verbal cueing   Wheelchair 150 feet activity     Assist      Assist Level: Maximal Assistance - Patient 25 - 49%   Blood pressure 110/74, pulse (!) 58, temperature 99.4 F (37.4 C), temperature source Oral, resp. rate 18, height 5\' 10"  (1.778 m), weight 81.8 kg, SpO2 97 %.  Medical Problem List and Plan: 1.  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  Antithrombotics: -DVT/anticoagulation/pulmonary emboli: Eliquis             -antiplatelet therapy: N/A 3. Pain Management: Neurontin 300 mg nightly, Lidoderm patch. Well controlled.  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 Mood: Provide emotional support             -antipsychotic agents: N/A 5. Neuropsych: This patient is capable of making decisions on his own behalf. 6. Skin/Wound Care: Routine skin checks 7. Fluids/Electrolytes/Nutrition: Routine in and outs with follow-up chemistries 8.  Acute blood loss anemia.  Hgb stable on 3/7 9.  Hypertension.  Norvasc 10 mg daily, Coreg 25 mg twice daily, hydralazine as directed. Well controlled.  Vitals:   11/06/19 0039 11/06/19 0512  BP: 118/71 110/74  Pulse: (!) 56 (!) 58  Resp:    Temp:  99.4 F (37.4 C)  SpO2:  97%  controlled  3/19  Asymptomatic brady  10.  BPH.  Flomax 11.  Diabetes mellitus.  Hemoglobin A1c 6.7.  SSI.  Patient on Glucophage 500 mg twice daily prior to admission.  Resume as needed CBG (last 3)   Recent Labs    11/05/19 1643 11/05/19 2221 11/06/19 0557  GLUCAP 127* 114* 111*  controlled 3/19  3/14- good control- con't meds 12.  Hyperlipidemia.  Lipitor 13. Constipation:   3/13 Type 6 BM , increase senna to 2 po BID  Used Mg Citrate at home no BM x 6 d, order 1 btl  14. Pleural effusions:stable sized  15. Disposition: Lives with wife who is at home and will be able to help him. Has 3 daughters whom he is very close with who also live nearby.      LOS: 13 days A FACE TO [place] E Uel Davidow 11/06/2019, 8:38 AM

## 2019-11-06 NOTE — Progress Notes (Signed)
Physical Therapy Session Note  Patient Details  Name: [name] [name] MRN: [id number] Date of Birth: 1945/11/23  Today's Date: 11/06/2019 PT Missed Time: 4 Minutes Missed Time Reason: Other (Comment)(personal - details below)  Upon arrival to patient's room he was with Happi, SLP; Marzetta Board, RN; and an MD who had just informed the patient that his sister passed away today. Missed 60 minutes of skilled physical therapy.  Tawana Scale, PT, DPT 11/06/2019, 8:04 AM

## 2019-11-07 ENCOUNTER — Inpatient Hospital Stay (HOSPITAL_COMMUNITY): Payer: Medicare Other | Admitting: Physical Therapy

## 2019-11-07 LAB — GLUCOSE, CAPILLARY
Glucose-Capillary: 107 mg/dL — ABNORMAL HIGH (ref 70–99)
Glucose-Capillary: 123 mg/dL — ABNORMAL HIGH (ref 70–99)
Glucose-Capillary: 114 mg/dL — ABNORMAL HIGH (ref 70–99)

## 2019-11-07 NOTE — Progress Notes (Signed)
Physical Therapy Session Note  Patient Details  Name: [name] [name] MRN: [id number] Date of Birth: [birth year]  Today's Date: 11/07/2019 PT Individual Time: [id number] PT Individual Time Calculation (min): 58 min   Short Term Goals: Week 2:  PT Short Term Goal 1 (Week 2): STG = LTG d/t ELOS  Skilled Therapeutic Interventions/Progress Updates:   Pt received supine in bed and agreeable to therapy session. Pt reports he is doing "alright" and is ready to get out of the bed. Therapist donned B  TEDs max assist. Supine>sit R EOB with min assist progressed to mod assist for trunk upright due to posterior lean/LOB in sitting requiring min assist for sitting balance while therapist donned shoes max assist. Sit>stand EOB>RW with min/mod assist for balance initially due to strong posterior lean/LOB - performed repeated sit<>stands EOB<>RW x5 reps targeting improved motor planning and balance to decrease posterior lean/LOB with pt progressing to only CGA for steadying. R stand pivot to w/c with pt demonstrating onset of posterior lean again when turning to step backwards and sit.  Transported to/from gym in w/c for time management and energy conservation. Block practice stand pivot transfers using RW with min/mod assist initially due to posterior lean/LOB progressed to [place] for steadying. Gait training 176ft x2 (seated break between) using RW with min assist progressed to CGA for balance - pt continues to demonstrate progressive worsening of his hip/knee flexion into a squat position with fatigue, decreased B LE step length, and R LE hip adduction during swing causing narrow BOS with weakness noted in R hip abductors during stance causing L hip drop during R stance - cuing throughout to improve above gait impairments. Pt reports need to have BM. Transported back to room in w/c. Stand pivot w/c<>BSC over toilet using grab bars with CGA for steadying. Sit<>stand on/off BSC over toilet with CGA using grab bars.  Continent of small BM and performed seated peri-care without assist. Seated hand hygiene at sink with min cuing for locating soap. Standing heel raises 2x10reps with CGA for steadying targeting increased strength for improved gait mechanics and pt demonstrating improved heel clearance from floor. Pt left seated in w/c with needs in reach and seat belt alarm on.  Therapy Documentation Precautions:  Precautions Precautions: Fall Restrictions Weight Bearing Restrictions: No  Pain:  Reports "maybe a little bit" of pain in his low back - provided rest breaks and repositioning as needed for pain management.    Therapy/Group: Individual Therapy  Tawana Scale, PT, DPT 11/07/2019, 3:33 PM

## 2019-11-08 ENCOUNTER — Inpatient Hospital Stay (HOSPITAL_COMMUNITY): Payer: Medicare Other

## 2019-11-08 ENCOUNTER — Inpatient Hospital Stay (HOSPITAL_COMMUNITY): Payer: Medicare Other | Admitting: Speech Pathology

## 2019-11-08 LAB — GLUCOSE, CAPILLARY
Glucose-Capillary: 105 mg/dL — ABNORMAL HIGH (ref 70–99)
Glucose-Capillary: 132 mg/dL — ABNORMAL HIGH (ref 70–99)
Glucose-Capillary: 144 mg/dL — ABNORMAL HIGH (ref 70–99)

## 2019-11-08 NOTE — Progress Notes (Signed)
Speech Language Pathology Daily Session Note  Patient Details  Name: [name] [name] MRN: [id number] Date of Birth: 05-Feb-1946  Today's Date: 11/08/2019 SLP Individual Time: 1040-1110 SLP Individual Time Calculation (min): 30 min  Short Term Goals: Week 2: SLP Short Term Goal 1 (Week 2): Pt will utilize external aids to recall mildly complex daily information with min assist verbal cues. SLP Short Term Goal 2 (Week 2): Pt will complete mildly complex functional tasks with min assist for functional problem solving. SLP Short Term Goal 3 (Week 2): Pt will recognize and correct errors during mildly complex functional tasks with min assist verbal cues. SLP Short Term Goal 4 (Week 2): Pt will sustain his attention to functional tasks for 10 minute intervals with min verbal cues for redirection.  Skilled Therapeutic Interventions: Patient received skilled SLP services targeting cognitive goals. With min verbal cues patient identified 3 external compensatory memory strategies he can implement in the home environment. SLP facilitated list making as an external compensatory memory strategy with patient creating a grocery list of 10 items he needs at the store. Patient participated in a sustained attention task verbally sequencing the steps to completing ADLs with min verbal cues for redirection. At the end of therapy session patient was left upright in bed, bed alarm activated, and all needs within reach.  Pain Pain Assessment Pain Scale: 0-10 Pain Score: 0-No pain  Therapy/Group: Individual Therapy  Cristy Folks 11/08/2019, 12:03 PM

## 2019-11-08 NOTE — Progress Notes (Signed)
Physical Therapy Session Note  Patient Details  Name: [name] [name] MRN: [id number] Date of Birth: [birth year]  Today's Date: 11/08/2019 PT Individual Time: 1401-1500 PT Individual Time Calculation (min): 59 min   Short Term Goals: Week 2:  PT Short Term Goal 1 (Week 2): STG = LTG d/t ELOS  Skilled Therapeutic Interventions/Progress Updates:   Pt seated in w/c upon PT arrival, agreeable to therapy tx and denies pain. Pt's wife present in the room. Pt propelled w/c x 120 ft using B LE s for hamstring strengthening and reciprocal LE movement. Pt then ambulated x 90 ft with RW and min assist, increased flexed posture with fatigue, R hip abductor weakness noted with trendelenburg pattern. Pt ambulated 2 x 30 ft this session without AD and with min assist, cues for upright posture and step length. Within the parallel bars pt performed sidestepping for dynamic balance and hip abductor weakness, 2 x 10 ft in each direction with UE support on parallel bars, and then 2 x 10 ft sidestepping without UE support - during this pt with intermittent posterior LOB therapist providing cues to try to engage hip/ankle strategies to correct LOB rather than relying on UE support. Pt performed x 12 sit<>stands from elevated mat with hands on knees to minimize UE support, in standing emphasis on full hip extension/glute activation, emphasis on anterior weightshift during transition, CGA-min assist with cues for techniques. Pt worked on static standing balance this session while on airex without UE support, cues for weightshift to limit posterior lean/LOB. Pt worked on dynamic standing balance while on airex performing reaching task to facilitate anterior weightshift, min assist. Pt reports increased back pain with activities, transferred to w/c with back support to take therapeutic seated rest break. Pt ascended/descended 4 steps this session with single rail on R and step to pattern, min assist with cues for techniques. Pt  ambulated 2 x 90 ft this session without AD back towards room (seated break between bouts secondary to fatigue), min-mod assist with cues for postural control, cues for upright posture, step length and speed. Pt left in w/c at end of session with needs in reach and wife present.     Therapy Documentation Precautions:  Precautions Precautions: Fall Restrictions Weight Bearing Restrictions: No    Therapy/Group: Individual Therapy  Netta Corrigan, PT, DPT, CSRS 11/08/2019, 8:00 AM

## 2019-11-09 ENCOUNTER — Inpatient Hospital Stay (HOSPITAL_COMMUNITY): Payer: Medicare Other

## 2019-11-09 ENCOUNTER — Inpatient Hospital Stay (HOSPITAL_COMMUNITY): Payer: Medicare Other | Admitting: Speech Pathology

## 2019-11-09 ENCOUNTER — Inpatient Hospital Stay (HOSPITAL_COMMUNITY): Payer: Medicare Other | Admitting: Occupational Therapy

## 2019-11-09 LAB — GLUCOSE, CAPILLARY
Glucose-Capillary: 107 mg/dL — ABNORMAL HIGH (ref 70–99)
Glucose-Capillary: 105 mg/dL — ABNORMAL HIGH (ref 70–99)
Glucose-Capillary: 124 mg/dL — ABNORMAL HIGH (ref 70–99)
Glucose-Capillary: 96 mg/dL (ref 70–99)
Glucose-Capillary: 126 mg/dL — ABNORMAL HIGH (ref 70–99)

## 2019-11-09 MED ORDER — ASPIRIN 81 MG PO CHEW
81.0000 mg | CHEWABLE_TABLET | Freq: Every day | ORAL | Status: DC
  Administered 2019-11-09 – 2019-11-13 (×5): 81 mg via ORAL
  Filled 2019-11-09 (×5): qty 1

## 2019-11-09 MED ORDER — AMLODIPINE BESYLATE 5 MG PO TABS
5.0000 mg | ORAL_TABLET | Freq: Every day | ORAL | Status: DC
  Administered 2019-11-10 – 2019-11-13 (×4): 5 mg via ORAL
  Filled 2019-11-09 (×4): qty 1

## 2019-11-09 MED ORDER — ROSUVASTATIN CALCIUM 5 MG PO TABS
10.0000 mg | ORAL_TABLET | Freq: Every day | ORAL | Status: DC
  Administered 2019-11-09 – 2019-11-12 (×4): 10 mg via ORAL
  Filled 2019-11-09 (×4): qty 2

## 2019-11-09 NOTE — Progress Notes (Signed)
Speech Language Pathology Weekly Progress and Session Note  Patient Details  Name: [name] [name] MRN: [id number] Date of Birth: [birth year]  Beginning of progress report period: November 02, 2019 End of progress report period: November 09, 2019  Today's Date: 11/09/2019 SLP Individual Time: 1001-1045 SLP Individual Time Calculation (min): 44 min  Short Term Goals: Week 2: SLP Short Term Goal 1 (Week 2): Pt will utilize external aids to recall mildly complex daily information with min assist verbal cues. SLP Short Term Goal 1 - Progress (Week 2): Progressing toward goal SLP Short Term Goal 2 (Week 2): Pt will complete mildly complex functional tasks with min assist for functional problem solving. SLP Short Term Goal 2 - Progress (Week 2): Progressing toward goal SLP Short Term Goal 3 (Week 2): Pt will recognize and correct errors during mildly complex functional tasks with min assist verbal cues. SLP Short Term Goal 3 - Progress (Week 2): Progressing toward goal SLP Short Term Goal 4 (Week 2): Pt will sustain his attention to functional tasks for 10 minute intervals with min verbal cues for redirection. SLP Short Term Goal 4 - Progress (Week 2): Met    New Short Term Goals: Week 3: SLP Short Term Goal 1 (Week 3): STG=LTG due to remaining LOS  Weekly Progress Updates: Pt has made slow and minimal functional gains this reporting period, meeting 1 out of 4 short term goals. Pt is currently Mod-Max assist for basic cognitive tasks due to delayed processing and cognitive impairments impacting his short term recall, basic problem solving, sustained attention, and emergent awareness. Pt has excellent intellectual awareness of deficits and demonstrated improved sustained attention to tasks this week. Severity of deficits and significant emotional distress due to loss of a family member believed to influence pt's minimal progress. Pt and family education is ongoing. Pt would continue to benefit from skilled  ST while inpatient in order to maximize functional independence and reduce burden of care prior to discharge. Anticipate that pt will need 24/7 supervision at discharge in addition to [place] follow up at next level of care.       Intensity: Minumum of 1-2 x/day, 30 to 90 minutes Frequency: 3 to 5 out of 7 days Duration/Length of Stay: 11/13/19 Treatment/Interventions: Cognitive remediation/compensation;Cueing hierarchy;Functional tasks;Internal/external aids;Patient/family education   Daily Session  Skilled Therapeutic Interventions: Pt was seen for skilled ST targeting cognitive goals. Pt accurately recalled number of therapy sessions prior to ST and 2 activities completed with Min A question cues, however Max A required for pt to verbally recall compensatory memory strategies discussed with ST over the weekend. Use of verbal repetition/rehearsal strategy increased pt's recall within a n-2 memory card task with Mod A verbal and visual cues. Pt used schedule to anticipate future therapy appointments with Mod A verbal and visual cues for problem solving, recall, and organization. Pt left sitting in wheelchair with alarm set and needs within reach. Continue per current plan of care.          Pain Pain Assessment Pain Scale: 0-10 Pain Score: 0-No pain  Therapy/Group: Individual Therapy  Arbutus Leas 11/09/2019, 7:18 AM

## 2019-11-09 NOTE — Progress Notes (Signed)
PHYSICAL MEDICINE & REHABILITATION PROGRESS NOTE   Subjective/Complaints: Continues to have some constipation- last BM was 2 days prior.  Denies pain.  Slept well last night.   ROS-  Pt denies SOB, abd pain, CP, N/V/C/D, and vision changes  Objective:   No results found. No results for input(s): WBC, HGB, HCT, PLT in the last 72 hours. No results for input(s): NA, K, CL, CO2, GLUCOSE, BUN, CREATININE, CALCIUM in the last 72 hours.  Intake/Output Summary (Last 24 hours) at 11/09/2019 1322 Last data filed at 11/09/2019 0806 Gross per 24 hour  Intake 360 ml  Output --  Net 360 ml     Physical Exam: Vital Signs Blood pressure 117/60, pulse (!) 50, temperature 97.6 F (36.4 C), temperature source Oral, resp. rate 16, height 5\' 10"  (1.778 m), weight 80.7 kg, SpO2 98 %. General: No acute distress. Sitting up in chair comfortably.  Mood and affect are appropriate Heart: Regular rate and rhythm no rubs murmurs or extra sounds Lungs: Clear to auscultation, breathing unlabored, no rales or wheezes Abdomen: Positive bowel sounds, soft nontender to palpation, nondistended Extremities: No clubbing, cyanosis, or edema Skin: No evidence of breakdown, no evidence of rash Neurologic: Cranial nerves II through XII intact, motor strength is 5/5 in bilateral deltoid, bicep, tricep, grip, hip flexor, knee extensors, ankle dorsiflexor and plantar flexor Musculoskeletal: Full range of motion in all 4 extremities. No joint swelling  Assessment/Plan: 1. 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  Physiatrist is providing close team supervision and 24 hour management of active medical problems listed below.  Physiatrist and rehab team continue to assess barriers to  discharge/monitor patient progress toward functional and medical goals  Care Tool:  Bathing    Body parts bathed by patient: Right arm, Left arm, Chest, Abdomen, Front perineal area, Right upper leg, Left upper leg, Face, Right lower leg, Left lower leg, Buttocks   Body parts bathed by helper: Buttocks     Bathing assist Assist Level: Contact Guard/Touching assist     Upper Body Dressing/Undressing Upper body dressing   What is the patient wearing?: Pull over shirt    Upper body assist Assist Level: Supervision/Verbal cueing    Lower Body Dressing/Undressing Lower body dressing      What is the patient wearing?: Incontinence brief, Pants     Lower body assist Assist for lower body dressing: Contact Guard/Touching assist     Toileting Toileting    Toileting assist Assist for toileting: Minimal Assistance - Patient > 75% Assistive Device Comment: Urinal   Transfers Chair/bed transfer  Transfers assist     Chair/bed transfer assist level: Minimal Assistance - Patient > 75%     Locomotion Ambulation   Ambulation assist      Assist level: Minimal Assistance - Patient > 75% Assistive device: Walker-rolling Max distance: 10'   Walk 10 feet activity   Assist  Walk 10 feet activity did not occur: Safety/medical concerns(decreased balance/activity tolerance)  Assist level: Minimal Assistance - Patient > 75% Assistive device: Walker-rolling   Walk 50 feet activity   Assist Walk 50 feet with 2 turns activity did not occur: Safety/medical concerns(decreased balance/activity tolerance)  Assist level: Minimal Assistance - Patient > 75% Assistive device: No Device    Walk 150 feet activity   Assist Walk 150  feet activity did not occur: Safety/medical concerns(decreased balance/activity tolerance)  Assist level: Minimal Assistance - Patient > 75% Assistive device: Walker-rolling    Walk 10 feet on uneven surface  activity   Assist Walk 10 feet on  uneven surfaces activity did not occur: Safety/medical concerns(decreased balance/activity tolerance)         Wheelchair     Assist   Type of Wheelchair: Manual    Wheelchair assist level: Supervision/Verbal cueing Max wheelchair distance: 65ft    Wheelchair 50 feet with 2 turns activity    Assist        Assist Level: Supervision/Verbal cueing   Wheelchair 150 feet activity     Assist      Assist Level: Maximal Assistance - Patient 25 - 49%   Blood pressure 117/60, pulse (!) 50, temperature 97.6 F (36.4 C), temperature source Oral, resp. rate 16, height 5\' 10"  (1.778 m), weight 80.7 kg, SpO2 98 %.  Medical Problem List and Plan: 1.  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  Antithrombotics: -DVT/anticoagulation/pulmonary emboli: Eliquis             -antiplatelet therapy: N/A 3. Pain Management: Neurontin 300 mg nightly, Lidoderm patch. Well controlled.  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 Mood: Provide emotional support             -antipsychotic agents: N/A 5. Neuropsych: This patient is capable of making decisions on his own behalf. 6. Skin/Wound Care: Routine skin checks 7. Fluids/Electrolytes/Nutrition: Routine in and outs with follow-up chemistries 8.  Acute blood loss anemia.  Hgb stable on 3/7 9.  Hypertension.  Norvasc 10 mg daily, Coreg 25 mg twice daily, hydralazine as directed. Well controlled.  Vitals:   11/08/19 2045 11/09/19 0326  BP: (!) 103/57 117/60  Pulse: (!) 57 (!) 50  Resp: 16 16  Temp: 99.2 F (37.3 C) 97.6 F (36.4 C)  SpO2: 96% 98%  controlled  3/19  3/22: Low BP this morning and last night. Flowsheet reviewed. Has been low for the most part. Will  decrease Amlodipine to 5mg  daily.   Asymptomatic brady- continues to be brady.   10.  BPH.  Flomax 11.  Diabetes mellitus.  Hemoglobin A1c 6.7.  SSI.  Patient on Glucophage 500 mg twice daily prior to admission.  Resume as needed CBG (last 3)  Recent Labs    11/08/19 2043 11/09/19 0618 11/09/19 1150  GLUCAP 144* 105* 124*  controlled 3/19, 3/22  3/14- good control- con't meds 12.  Hyperlipidemia.  Lipitor 13. Constipation:   3/13 Type 6 BM , increase senna to 2 po BID  Used Mg Citrate at home no BM x 6 d, order 1 btl   Added prune juice with meals.  14. Pleural effusions:stable sized  15. Disposition: Lives with wife who is at home and will be able to help him. Has 3 daughters whom he is very close with who also live nearby.      LOS: 16 days A FACE TO FACE EVALUATION WAS PERFORMED  Martha Clan P Darryel Diodato 11/09/2019, 1:22 PM

## 2019-11-09 NOTE — Plan of Care (Signed)
  Problem: RH Ambulation Goal: LTG Patient will ambulate in community environment (PT) Description: LTG: Patient will ambulate in community environment, # of feet with assistance (PT). Outcome: Not Applicable Flowsheets (Taken 11/09/2019 0857) LTG: Pt will ambulate in community environ  assist needed:: (goal d/c'd 3/22 as pt will use w/c for community access) --   Problem: RH Balance Goal: LTG Patient will maintain dynamic sitting balance (PT) Description: LTG:  Patient will maintain dynamic sitting balance with assistance during mobility activities (PT) Flowsheets (Taken 11/09/2019 0817) LTG: Pt will maintain dynamic sitting balance during mobility activities with:: (downgraded 3/22 as pt requires supervision for cognitive deficits) Supervision/Verbal cueing   Problem: RH Bed Mobility Goal: LTG Patient will perform bed mobility with assist (PT) Description: LTG: Patient will perform bed mobility with assistance, with/without cues (PT). Flowsheets (Taken 11/09/2019 0817) LTG: Pt will perform bed mobility with assistance level of: (downgraded 3/22 as pt requires supervision for cognitive deficits) Supervision/Verbal cueing   Problem: RH Ambulation Goal: LTG Patient will ambulate in controlled environment (PT) Description: LTG: Patient will ambulate in a controlled environment, # of feet with assistance (PT). Flowsheets Taken 11/09/2019 [id number] by Enid Skeens, Raquel Sarna, PT LTG: Pt will ambulate in controlled environ  assist needed:: (downgraded on 3/22 secondary to cognitive deficits, decreased motor planning) Contact Guard/Touching assist Taken 10/25/2019 1306 by Grunenberg, Cherie L, PT LTG: Ambulation distance in controlled environment: 100 ft using LRAD Goal: LTG Patient will ambulate in home environment (PT) Description: LTG: Patient will ambulate in home environment, # of feet with assistance (PT). Flowsheets Taken 11/09/2019 [id number] by Enid Skeens, Raquel Sarna, PT LTG: Pt will ambulate in home environ   assist needed:: (downgraded on 3/22 secondary to cognitive deficits, decreased motor planning) Contact Guard/Touching assist Taken 10/25/2019 1306 by Grunenberg, Cherie L, PT LTG: Ambulation distance in home environment: 50 ft using LRAD   Problem: RH Stairs Goal: LTG Patient will ambulate up and down stairs w/assist (PT) Description: LTG: Patient will ambulate up and down # of stairs with assistance (PT) 11/09/2019 0900 by Enid Skeens, Raquel Sarna, PT Flowsheets Taken 11/09/2019 0900 LTG: Pt will ambulate up/down stairs assist needed:: (downgraded on 3/22 secondary to cognitive deficits, decreased motor planning) Minimal Assistance - Patient > 75% Taken 11/09/2019 0859 LTG: Pt will  ambulate up and down number of stairs: 12 steps with B rails 11/09/2019 0859 by Enid Skeens, Raquel Sarna, PT Flowsheets (Taken 11/09/2019 [phone]) LTG: Pt will ambulate up/down stairs assist needed:: Minimal Assistance - Patient > 75% LTG: Pt will  ambulate up and down number of stairs: 12 steps with B rails

## 2019-11-09 NOTE — Plan of Care (Signed)
  Problem: RH Problem Solving Goal: LTG Patient will demonstrate problem solving for (SLP) Description: LTG:  Patient will demonstrate problem solving for basic/complex daily situations with cues  (SLP) Flowsheets (Taken 11/09/2019 1219) LTG Patient will demonstrate problem solving for: Moderate Assistance - Patient 50 - 74%   Problem: RH Memory Goal: LTG Patient will use memory compensatory aids to (SLP) Description: LTG:  Patient will use memory compensatory aids to recall biographical/new, daily complex information with cues (SLP) Flowsheets (Taken 11/09/2019 1219) LTG: Patient will use memory compensatory aids to (SLP): Moderate Assistance - Patient 50 - 74%   Problem: RH Attention Goal: LTG Patient will demonstrate this level of attention during functional activites (SLP) Description: LTG:  Patient will will demonstrate this level of attention during functional activites (SLP) Flowsheets (Taken 11/09/2019 1219) Patient will demonstrate this level of attention during cognitive/linguistic activities in: Controlled LTG: Patient will demonstrate this level of attention during cognitive/linguistic activities with assistance of (SLP): Minimal Assistance - Patient > 75%   Problem: RH Awareness Goal: LTG: Patient will demonstrate awareness during functional activites type of (SLP) Description: LTG: Patient will demonstrate awareness during functional activites type of (SLP) Flowsheets (Taken 11/09/2019 1219) LTG: Patient will demonstrate awareness during cognitive/linguistic activities with assistance of (SLP): Moderate Assistance - Patient 50 - 74%  Goals downgraded due to slower than anticipated progress and severity of current deficits

## 2019-11-09 NOTE — Progress Notes (Signed)
Occupational Therapy Session Note  Patient Details  Name: [name] [name] MRN: [id number] Date of Birth: 02/21/1946  Today's Date: 11/09/2019 OT Individual Time: 1107-1203 OT Individual Time Calculation (min): 56 min    Short Term Goals: Week 2:  OT Short Term Goal 1 (Week 2): Pt will be able to ambulate to bathroom with CGA and use of the RW for support. OT Short Term Goal 2 (Week 2): Pt will complete all dressing with no more than min assist from the EOB/. OT Short Term Goal 3 (Week 2): Pt will complete LB dressing sit to stand with min guard assist. OT Short Term Goal 4 (Week 2): Pt will complete bathing and dressing tasks with no more than two questioning cues.  Skilled Therapeutic Interventions/Progress Updates:    Pt completed shower and dressing during session.  He was able to ambulate from the wheelchair at bedside to the shower with min guard assist using the RW for support.  Once in the shower, he removed all clothing with min guard assist and then completed shower sit to stand at the same level.  He did need increased time and min instructional cueing to recall how to adjust the water and locate the valve on the right side.  Min instructional cueing also needed for thoroughly rinsing off as pt did not rinse his right arm, back, or part of the RLE.  Increased posterior lean noted in standing, but utilized the grab bar to help maintain balance in standing.  He ambulated out to the wheelchair at the sink for dressing.  He completed UB with setup and then LB with min guard except for TEDs which were total assist.  Pt left in the wheelchair at end of session with call button and phone in reach and safety belt in place.    Therapy Documentation Precautions:  Precautions Precautions: Fall Restrictions Weight Bearing Restrictions: No  Pain: Pain Assessment Pain Scale: 0-10 Pain Score: 0-No pain ADL: See Care Tool Section for some details of mobility and selfcare  Therapy/Group:  Individual Therapy  Errin Chewning,Wilbur OTR/L 11/09/2019, 12:44 PM

## 2019-11-09 NOTE — Progress Notes (Addendum)
Physical Therapy Session Note  Patient Details  Name: [name] [name] MRN: [id number] Date of Birth: 1945/09/18  Today's Date: 11/09/2019 PT Individual Time: 0802-0900 PT Individual Time Calculation (min): 58 min    Short Term Goals: Week 2:  PT Short Term Goal 1 (Week 2): STG = LTG d/t ELOS  Skilled Therapeutic Interventions/Progress Updates:    Patient supine in bed with HOB elevated upon PT arrival, agreeable to therapy tx, denies pain. Therapist donned on B TEDs totalA. Supine > sitting EOB with supervision. In sitting, therapist donned pants and B shoes maxA for LE threading, pt able to engaged B HS to push heel down into shoe. Sit > standing at RW with minA and cueing for sequencing and to bring weight towards his toes d/t significant posterior lean. In standing, pt performed clothing management with bouts of unilateral to no UE support on walker and minA to steady. Therapist noticed bed was saturated and pt reported needing a brief change after prompting. Pt ambulated with RW 10' into bathroom with min-modA for upright posture and RW management. While standing in bathroom, pt performed clothing management with minA for standing balance, stand > sit with CGA and cues for sequencing. Therapist provided distance supervision for pt privacy. Pt reported not needing to use bathroom anymore, sit > stand at RW with minA and cues for sequencing. Pt ambulated to sink with RW and minA to perform hand hygiene. Stand pivot transfer to w/c with minA and cues for sequencing. Pt performed w/c mobility with BLE only for LE strengthening 150' to rehab gym with [name] and directional cues and technique. Sit > stand > stand pivot transfer to mat with minA without AD, cues for sequencing and posture. Pt then participated in blocked practice of sit > stands with and without AD with minA d/t significant posterior lean. Therapist used feedback from theraband across pt's chest and then hips for pt to lean  forward while seated for multiple repetitions, pt able to meet resistance and lean forward with max cueing. Attempted sit <> stands with theraband feedback however pt significantly challenged by overcoming min resistance d/t significant posterior lean. Pt performed sit <> stands with minA + 2 with second helper providing B HHA out infront of pt to facilitate anterior trunk lean and forward posture once in standing. Throughout session, pt had numerous LOB posteriorly > sitting on mat d/t inability to correct posterior lean. Pt appeared significantly fatigued at end of session. Stand pivot transfer without AD > w/c with minA. Pt transported in w/c back to room, left in w/c with needs in reach and chair alarm set.   Therapy Documentation Precautions:  Precautions Precautions: Fall Restrictions Weight Bearing Restrictions: No    Therapy/Group: Individual Therapy  Juliann Pulse SPT 11/09/2019, 7:33 AM

## 2019-11-09 NOTE — Plan of Care (Signed)
  Problem: RH Balance Goal: LTG Patient will maintain dynamic sitting balance (PT) Description: LTG:  Patient will maintain dynamic sitting balance with assistance during mobility activities (PT) Flowsheets (Taken 11/09/2019 0817) LTG: Pt will maintain dynamic sitting balance during mobility activities with:: (downgraded 3/22 as pt requires supervision for cognitive deficits) Supervision/Verbal cueing   Problem: RH Bed Mobility Goal: LTG Patient will perform bed mobility with assist (PT) Description: LTG: Patient will perform bed mobility with assistance, with/without cues (PT). Flowsheets (Taken 11/09/2019 0817) LTG: Pt will perform bed mobility with assistance level of: (downgraded 3/22 as pt requires supervision for cognitive deficits) Supervision/Verbal cueing

## 2019-11-09 NOTE — Plan of Care (Signed)
  Problem: Consults Goal: RH STROKE PATIENT EDUCATION Description: See Patient Education module for education specifics  Outcome: Progressing Goal: Diabetes Guidelines if Diabetic/Glucose > 140 Description: If diabetic or lab glucose is > 140 mg/dl - Initiate Diabetes/Hyperglycemia Guidelines & Document Interventions  Outcome: Progressing   Problem: RH BLADDER ELIMINATION Goal: RH STG MANAGE BLADDER WITH ASSISTANCE Description: STG Manage Bladder With min Assistance Outcome: Progressing   Problem: RH SKIN INTEGRITY Goal: RH STG MAINTAIN SKIN INTEGRITY WITH ASSISTANCE Description: STG Maintain Skin Integrity With min Assistance. Outcome: Progressing   Problem: RH SAFETY Goal: RH STG ADHERE TO SAFETY PRECAUTIONS W/ASSISTANCE/DEVICE Description: STG Adhere to Safety Precautions With mod I Assistance/Device. 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 Outcome: Progressing Goal: RH STG MANAGE BOWEL W/MEDICATION W/ASSISTANCE Description: STG Manage Bowel with Medication with mod Assistance. Outcome: Progressing

## 2019-11-10 ENCOUNTER — Inpatient Hospital Stay (HOSPITAL_COMMUNITY): Payer: Medicare Other | Admitting: Occupational Therapy

## 2019-11-10 ENCOUNTER — Inpatient Hospital Stay (HOSPITAL_COMMUNITY): Payer: Medicare Other | Admitting: Physical Therapy

## 2019-11-10 ENCOUNTER — Inpatient Hospital Stay (HOSPITAL_COMMUNITY): Payer: Medicare Other

## 2019-11-10 LAB — GLUCOSE, CAPILLARY
Glucose-Capillary: 105 mg/dL — ABNORMAL HIGH (ref 70–99)
Glucose-Capillary: 120 mg/dL — ABNORMAL HIGH (ref 70–99)
Glucose-Capillary: 111 mg/dL — ABNORMAL HIGH (ref 70–99)
Glucose-Capillary: 114 mg/dL — ABNORMAL HIGH (ref 70–99)

## 2019-11-10 NOTE — Progress Notes (Signed)
Physical Therapy Session Note  Patient Details  Name: [name] [name] MRN: [id number] Date of Birth: [birth year]  Today's Date: 11/10/2019 PT Individual Time: 1555-1708 PT Individual Time Calculation (min): 73 min   Short Term Goals: Week 2:  PT Short Term Goal 1 (Week 2): STG = LTG d/t ELOS  Skilled Therapeutic Interventions/Progress Updates:   Pt received supine in bed with his wife present and DME equipment provider dropping off pt's w/c. Pt agreeable to therapy session. Supine>sit, HOB elevated, with supervision and increased time. Sitting EOB with supervision for sitting balance (no posterior lean noted) donned shoes min assist. R stand pivot to w/c using RW with CGA for steadying. Therapist educated pt's wife on his 34 and his need for 24hr support as well as plan for hands-on training with her this Thursday. Transported to/from gym in w/c for time management and energy conservation. Sit<>stands using RW with CGA for steadying throughout session (no posterior lean during transfers noted this session). Standing without UE support pt able to adjust LB clothing with CGA for steadying and no posterior lean noted. Gait training 167ft using RW with CGA for steadying - demonstrates improving upright trunk posture with decreased trunk flexion and decreased hip flexion as well as improving knee extension. Standing with B UE support on RW performed heel raises 2x15 reps. Gait training ~33ft x2 to/from steps using RW with CGA for steadying - with fatigue on way back from steps pt demonstrates increasing B LE hip/knee flexion with progression towards crouched posture. Ascended/descended 12steps using B UE support progressed to only R UE support for functional strengthening cuing pt for reciprocal stepping pattern with min/mod assist for balance due to posterior lean on descent. Gait training ~73ft to // bars using RW with CGA for steadying - pt becoming fatigued demonstrating decreasing gait speed, decreased  speed of movement, decreased step length, and increased crouched posture. Performed forward ambulation in // bars without UE support with min assist - demonstrates worsening of crouched gait, even further decreased B LE step length, and slight hip drop bilaterally. Performed lateral side stepping in // bars without UE support requiring mod assist for balance due to strong posterior lean with pt demonstrating delayed and inadequate balance strategies despite max cuing. Pt reporting onset of significant fatigue but agreeable to continue session. Performed another bout of down/back forward ambulation in // bars without UE support with min assist and then side stepping without UE support with pt continuing to have strong posterior lean requiring mod/max assist to maintain upright - pt noted to have decreased anterior tibialis activation during posterior LOB therefore performed standing with BUE support toe raises (minimal clearance noted) x15 reps. Transported back to room in w/c. Pt agreeable to remain in w/c to have dinner. Pt left with needs in reach, seat belt alarm on, and his wife present.  Therapy Documentation Precautions:  Precautions Precautions: Fall Restrictions Weight Bearing Restrictions: No  Pain: Reports "a little in my back" with pt reporting slight increase during session but this does not limit participation - provided seated rest breaks for pain management.     Therapy/Group: Individual Therapy  Tawana Scale, PT, DPT 11/10/2019, 3:49 PM

## 2019-11-10 NOTE — Plan of Care (Signed)
  Problem: Consults Goal: RH STROKE PATIENT EDUCATION Description: See Patient Education module for education specifics  Outcome: Progressing Goal: Diabetes Guidelines if Diabetic/Glucose > 140 Description: If diabetic or lab glucose is > 140 mg/dl - Initiate Diabetes/Hyperglycemia Guidelines & Document Interventions  Outcome: Progressing   Problem: RH BLADDER ELIMINATION Goal: RH STG MANAGE BLADDER WITH ASSISTANCE Description: STG Manage Bladder With min Assistance Outcome: Progressing   Problem: RH SKIN INTEGRITY Goal: RH STG MAINTAIN SKIN INTEGRITY WITH ASSISTANCE Description: STG Maintain Skin Integrity With min Assistance. Outcome: Progressing   Problem: RH SAFETY Goal: RH STG ADHERE TO SAFETY PRECAUTIONS W/ASSISTANCE/DEVICE Description: STG Adhere to Safety Precautions With mod I Assistance/Device. 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 Outcome: Progressing Goal: RH STG MANAGE BOWEL W/MEDICATION W/ASSISTANCE Description: STG Manage Bowel with Medication with mod Assistance. Outcome: Progressing

## 2019-11-10 NOTE — Progress Notes (Signed)
Physical Therapy Session Note  Patient Details  Name: [name] [name] MRN: [id number] Date of Birth: 09-07-1945  Today's Date: 11/10/2019 PT Individual Time: 0920-1000 PT Individual Time Calculation (min): 40 min   Short Term Goals: Week 2:  PT Short Term Goal 1 (Week 2): STG = LTG d/t ELOS  Skilled Therapeutic Interventions/Progress Updates:    Patient received up in [facility], very pleasant and ready for therapy today. Tolerated multiple bouts of gait training approximately 181f with min guard and cues for upright posture and safe distance from the RW today, also able to generally perform all transfers consistently through session with min guard and RW as well. Otherwise worked on functional balance activities on blue foam pad, and introduced postural/balance corrections with external perturbations posteriorly with cues for anterior weight shift and occasional stepping strategy. Continues to demonstrate posterior lean and difficulty correcting posterior LOB. He was left up in his WC with all needs met and seatbelt alarm active this morning.   Therapy Documentation Precautions:  Precautions Precautions: Fall Restrictions Weight Bearing Restrictions: No   Pain: Pain Assessment Pain Scale: 0-10 Pain Score: 0-No pain Faces Pain Scale: No hurt    Therapy/Group: Individual Therapy   KWindell Norfolk DPT, PN1   Supplemental Physical Therapist [place]   Pager [phone] Rehab Office [phone]   11/10/2019, 12:28 PM

## 2019-11-10 NOTE — Progress Notes (Addendum)
Occupational Therapy Session Note  Patient Details  Name: [name] [name] MRN: [id number] Date of Birth: 01-06-1946  Today's Date: 11/10/2019 OT Individual Time: [id number] OT Individual Time Calculation (min): 47 min    Short Term Goals: Week 2:  OT Short Term Goal 1 (Week 2): Pt will be able to ambulate to bathroom with CGA and use of the RW for support. OT Short Term Goal 2 (Week 2): Pt will complete all dressing with no more than min assist from the EOB/. OT Short Term Goal 3 (Week 2): Pt will complete LB dressing sit to stand with min guard assist. OT Short Term Goal 4 (Week 2): Pt will complete bathing and dressing tasks with no more than two questioning cues.  Skilled Therapeutic Interventions/Progress Updates:    Pt completed supine to sit EOB with supervision.  Increased LOB to the left and posteriorly when attempting to donn LB clothing.  Overall min assist for balance needed with min assist for pulling items over his hips.  He completed stand pivot transfer from the bed to the wheelchair with min assist.  Therapist then took him down to the day room.  Had pt work on standing balance while engaged in use of the [employer] and bean bags.  He was able to stand with min guard assist to pick up and toss the bean bags.  Had him also work on keeping up with his point total after each toss of four bean bags.  He demonstrates significant difficulty keeping up with his total or adding single to double digit numbers (6 + 8).  Had him ambulate to gather the bean bags using the RW with min assist.  Finished session with return to the room and pt left sitting up in the wheelchair with call button and phone in reach and alarm in place.   Therapy Documentation Precautions:  Precautions Precautions: Fall Restrictions Weight Bearing Restrictions: No  Pain: Pain Assessment Pain Scale: Faces Faces Pain Scale: No hurt ADL: See Care Tool Section for some details of mobility and  selfcare  Therapy/Group: Individual Therapy  Madi Bonfiglio,Fabien OTR/L  11/10/2019, 9:26 AM

## 2019-11-10 NOTE — Progress Notes (Signed)
Physical Therapy Session Note  Patient Details  Name: [name] [name] MRN: [id number] Date of Birth: 1946-03-09  Today's Date: 11/10/2019 PT Individual Time: 1115-1200 PT Individual Time Calculation (min): 45 min    Short Term Goals: Week 2:  PT Short Term Goal 1 (Week 2): STG = LTG d/t ELOS  Skilled Therapeutic Interventions/Progress Updates:    Patient supine in bed upon PT arrival, agreeable to therapy tx, denies pain. Therapist donned B shoes totalA for time management. Supine > sitting EOB with minA for trunk upright. Sit > stand to RW with minA and max cueing for anterior trunk lean and sequencing, intermittent cueing to push chest into therapist's hand to facilitate anterior trunk lean with success ~50-75% of the time. Stand pivot transfer with RW > w/c with modA and cues for sequencing. Pt transported in w/c to rehab gym for time management and energy conservation. Stand pivot transfer without AD > mat table with modA, continued cueing for anterior trunk lean and LE extension however pt continued to demonstrate significant posterior lean. Once seated on mat table, therapist donned [place]. Pt performed the following exercises in the [facility] with therapist facilitating anterior trunk lean by pulling harness forward: - fwd gait x 4 laps  - standing balance on decline wedge to facilitate anterior trunk lean progressed to dynamic reaching to therapist's hand at various heights and distances; posterior lean decreased with use of wedge, in sitting, therapist donned B heel wedges totalA  - repeated dynamic reaching in standing with B heel wedges with noted dec posterior lean  - fwd gait x 4 laps  - sitting on physioball attempting to facilitate A/P pelvis dissociation without success  - sitting on physioball marching x 3 each LE, however pt unable to maintain stability on ball requiring therapist assist  - mini-squats with therapist assist to bring chest forward x 10   Stand >  sitting on EOM for therapist to [place]. Sit > stand at [facility] with modA for anterior trunk lean and max cueing for sequencing. Stand pivot transfer with RW > w/c modA for RW management, forward trunk lean and max cueing for sequencing. Stand > sitting in w/c with minA. Pt transported back to room in w/c, left seated in w/c with needs in reach and chair alarm set.    Therapy Documentation Precautions:  Precautions Precautions: Fall Restrictions Weight Bearing Restrictions: No    Therapy/Group: Individual Therapy  Juliann Pulse SPT 11/10/2019, 7:36 AM

## 2019-11-10 NOTE — Plan of Care (Signed)
  Problem: RH Balance Goal: LTG: Patient will maintain dynamic sitting balance (OT) Description: LTG:  Patient will maintain dynamic sitting balance with assistance during activities of daily living (OT) Flowsheets (Taken 11/10/2019 1642) LTG: Pt will maintain dynamic sitting balance during ADLs with: (Goal downgraded based on progress) Contact Guard/Touching assist Goal: LTG Patient will maintain dynamic standing with ADLs (OT) Description: LTG:  Patient will maintain dynamic standing balance with assist during activities of daily living (OT)  Flowsheets (Taken 11/10/2019 1642) LTG: Pt will maintain dynamic standing balance during ADLs with: (goal downgraded based on progress) Minimal Assistance - Patient > 75%   Problem: Sit to Stand Goal: LTG:  Patient will perform sit to stand in prep for activites of daily living with assistance level (OT) Description: LTG:  Patient will perform sit to stand in prep for activites of daily living with assistance level (OT) Flowsheets (Taken 11/10/2019 1642) LTG: PT will perform sit to stand in prep for activites of daily living with assistance level: (goal downgraded based on progress) Contact Guard/Touching assist   Problem: RH Bathing Goal: LTG Patient will bathe all body parts with assist levels (OT) Description: LTG: Patient will bathe all body parts with assist levels (OT) Flowsheets (Taken 11/10/2019 1642) LTG: Pt will perform bathing with assistance level/cueing: (goal downgraded based on progress) Contact Guard/Touching assist LTG: Position pt will perform bathing:  Sit to Stand  Shower   Problem: RH Dressing Goal: LTG Patient will perform lower body dressing w/assist (OT) Description: LTG: Patient will perform lower body dressing with assist, with/without cues in positioning using equipment (OT) Flowsheets (Taken 11/10/2019 1642) LTG: Pt will perform lower body dressing with assistance level of: (goal downgraded based on progress) Minimal  Assistance - Patient > 75%   Problem: RH Toileting Goal: LTG Patient will perform toileting task (3/3 steps) with assistance level (OT) Description: LTG: Patient will perform toileting task (3/3 steps) with assistance level (OT)  Flowsheets (Taken 11/10/2019 1642) LTG: Pt will perform toileting task (3/3 steps) with assistance level: (goals downgraded based on progress) Contact Guard/Touching assist   Problem: RH Toilet Transfers Goal: LTG Patient will perform toilet transfers w/assist (OT) Description: LTG: Patient will perform toilet transfers with assist, with/without cues using equipment (OT) Flowsheets (Taken 11/10/2019 1642) LTG: Pt will perform toilet transfers with assistance level of: (goal downgraded based on progress) Contact Guard/Touching assist   Problem: RH Tub/Shower Transfers Goal: LTG Patient will perform tub/shower transfers w/assist (OT) Description: LTG: Patient will perform tub/shower transfers with assist, with/without cues using equipment (OT) Flowsheets (Taken 11/10/2019 1642) LTG: Pt will perform tub/shower stall transfers with assistance level of: (goal downgraded based on progress) Contact Guard/Touching assist LTG: Pt will perform tub/shower transfers from: Tub/shower combination

## 2019-11-10 NOTE — Progress Notes (Signed)
[name] [name] PHYSICAL MEDICINE & REHABILITATION PROGRESS NOTE   Subjective/Complaints:  The patient slept well last night.  He has had continued constipation 2 bowel movements 2 days ago after laxative. ROS-  Pt denies SOB, abd pain, CP, N/V/C/D, and vision changes  Objective:   No results found. No results for input(s): WBC, HGB, HCT, PLT in the last 72 hours. No results for input(s): NA, K, CL, CO2, GLUCOSE, BUN, CREATININE, CALCIUM in the last 72 hours.  Intake/Output Summary (Last 24 hours) at 11/10/2019 0912 Last data filed at 11/10/2019 0730 Gross per 24 hour  Intake 342 ml  Output 600 ml  Net -258 ml     Physical Exam: Vital Signs Blood pressure 117/63, pulse (!) 52, temperature 98.9 F (37.2 C), temperature source Oral, resp. rate 19, height 5\' 10"  (1.778 m), weight 81.3 kg, SpO2 98 %. General: No acute distress. Sitting up in chair comfortably.  Mood and affect are appropriate Heart: Regular rate and rhythm no rubs murmurs or extra sounds Lungs: Clear to auscultation, breathing unlabored, no rales or wheezes Abdomen: Positive bowel sounds, soft nontender to palpation, nondistended Extremities: No clubbing, cyanosis, or edema Skin: No evidence of breakdown, no evidence of rash Neurologic: Cranial nerves II through XII intact, motor strength is 5/5 in bilateral deltoid, bicep, tricep, grip, hip flexor, knee extensors, ankle dorsiflexor and plantar flexor Musculoskeletal: Full range of motion in all 4 extremities. No joint swelling  Assessment/Plan: 1. 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  Physiatrist is providing close team supervision and 24 hour management of active medical problems listed below.  Physiatrist and rehab team continue to  assess barriers to discharge/monitor patient progress toward functional and medical goals  Care Tool:  Bathing    Body parts bathed by patient: Right arm, Left arm, Chest, Abdomen, Front perineal area, Right upper leg, Left upper leg, Face, Right lower leg, Left lower leg, Buttocks   Body parts bathed by helper: Buttocks     Bathing assist Assist Level: Contact Guard/Touching assist     Upper Body Dressing/Undressing Upper body dressing   What is the patient wearing?: Pull over shirt    Upper body assist Assist Level: Supervision/Verbal cueing    Lower Body Dressing/Undressing Lower body dressing      What is the patient wearing?: Incontinence brief, Pants     Lower body assist Assist for lower body dressing: Contact Guard/Touching assist     Toileting Toileting    Toileting assist Assist for toileting: Minimal Assistance - Patient > 75% Assistive Device Comment: Urinal   Transfers Chair/bed transfer  Transfers assist     Chair/bed transfer assist level: Minimal Assistance - Patient > 75%     Locomotion Ambulation   Ambulation assist      Assist level: Minimal Assistance - Patient > 75% Assistive device: Walker-rolling Max distance: 10'   Walk 10 feet activity   Assist  Walk 10 feet activity did not occur: Safety/medical concerns(decreased balance/activity tolerance)  Assist level: Minimal Assistance - Patient > 75% Assistive device: Walker-rolling   Walk 50 feet activity   Assist Walk 50 feet with 2 turns activity did not occur: Safety/medical concerns(decreased balance/activity tolerance)  Assist level: Minimal Assistance - Patient > 75% Assistive device: No Device    Walk 150 feet activity   Assist Walk  150 feet activity did not occur: Safety/medical concerns(decreased balance/activity tolerance)  Assist level: Minimal Assistance - Patient > 75% Assistive device: Walker-rolling    Walk 10 feet on uneven surface   activity   Assist Walk 10 feet on uneven surfaces activity did not occur: Safety/medical concerns(decreased balance/activity tolerance)         Wheelchair     Assist   Type of Wheelchair: Manual    Wheelchair assist level: Supervision/Verbal cueing Max wheelchair distance: 13ft    Wheelchair 50 feet with 2 turns activity    Assist        Assist Level: Supervision/Verbal cueing   Wheelchair 150 feet activity     Assist      Assist Level: Maximal Assistance - Patient 25 - 49%   Blood pressure 117/63, pulse (!) 52, temperature 98.9 F (37.2 C), temperature source Oral, resp. rate 19, height 5\' 10"  (1.778 m), weight 81.3 kg, SpO2 98 %.  Medical Problem List and Plan: 1.  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 2.  Antithrombotics: -DVT/anticoagulation/pulmonary emboli: Eliquis             -antiplatelet therapy: N/A 3. Pain Management: Neurontin 300 mg nightly, Lidoderm patch. Well controlled.  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 Mood: Provide emotional support             -antipsychotic agents: N/A 5. Neuropsych: This patient is capable of making decisions on his own behalf. 6. Skin/Wound Care: Routine skin checks 7. Fluids/Electrolytes/Nutrition: Routine in and outs with follow-up chemistries 8.  Acute blood loss anemia.  Hgb stable on 3/7 9.  Hypertension.  Norvasc 10 mg daily, Coreg 25 mg twice daily, hydralazine as directed. Well controlled.  Vitals:   11/10/19 0505 11/10/19 0557  BP: 125/60 117/63  Pulse: (!) 53 (!) 52  Resp: 19 19  Temp: 98.6 F (37 C) 98.9 F (37.2 C)  SpO2: 99% 98%  controlled  3/19  3/23, normotensive  Asymptomatic brady-  continues to be brady.   10.  BPH.  Flomax 11.  Diabetes mellitus.  Hemoglobin A1c 6.7.  SSI.  Patient on Glucophage 500 mg twice daily prior to admission.  Resume as needed CBG (last 3)  Recent Labs    11/09/19 1654 11/09/19 2118 11/10/19 0624  GLUCAP 96 126* 105*  controlled 3/19, 3/22, 3/23  3/14- good control- con't meds 12.  Hyperlipidemia.  Lipitor 13. Constipation:   3/13 Type 6 BM , increase senna to 2 po BID  Used Mg Citrate at home no BM x 6 d, order 1 btl   Added prune juice with meals.  14. Pleural effusions:stable sized  15. Disposition: Lives with wife who is at home and will be able to help him. Has 3 daughters whom he is very close with who also live nearby.      LOS: 17 days A FACE TO FACE EVALUATION WAS PERFORMED  Charlett Blake 11/10/2019, 9:12 AM

## 2019-11-11 ENCOUNTER — Inpatient Hospital Stay (HOSPITAL_COMMUNITY): Payer: Medicare Other | Admitting: Speech Pathology

## 2019-11-11 ENCOUNTER — Inpatient Hospital Stay (HOSPITAL_COMMUNITY): Payer: Medicare Other | Admitting: Physical Therapy

## 2019-11-11 ENCOUNTER — Inpatient Hospital Stay (HOSPITAL_COMMUNITY): Payer: Medicare Other | Admitting: Occupational Therapy

## 2019-11-11 ENCOUNTER — Inpatient Hospital Stay (HOSPITAL_COMMUNITY): Payer: Medicare Other

## 2019-11-11 LAB — GLUCOSE, CAPILLARY
Glucose-Capillary: 102 mg/dL — ABNORMAL HIGH (ref 70–99)
Glucose-Capillary: 115 mg/dL — ABNORMAL HIGH (ref 70–99)
Glucose-Capillary: 138 mg/dL — ABNORMAL HIGH (ref 70–99)
Glucose-Capillary: 105 mg/dL — ABNORMAL HIGH (ref 70–99)

## 2019-11-11 MED ORDER — POLYETHYLENE GLYCOL 3350 17 G PO PACK
17.0000 g | PACK | Freq: Every day | ORAL | Status: DC
  Administered 2019-11-11 – 2019-11-13 (×3): 17 g via ORAL
  Filled 2019-11-11 (×3): qty 1

## 2019-11-11 MED ORDER — SENNOSIDES-DOCUSATE SODIUM 8.6-50 MG PO TABS
3.0000 | ORAL_TABLET | Freq: Two times a day (BID) | ORAL | Status: DC
  Administered 2019-11-11 – 2019-11-13 (×4): 3 via ORAL
  Filled 2019-11-11 (×4): qty 3

## 2019-11-11 NOTE — Progress Notes (Signed)
Speech Language Pathology Daily Session Note  Patient Details  Name: [name] [name] MRN: [id number] Date of Birth: 05/07/1946  Today's Date: 11/11/2019 SLP Individual Time: 1300-1330 SLP Individual Time Calculation (min): 30 min  Short Term Goals: Week 3: SLP Short Term Goal 1 (Week 3): STG=LTG due to remaining LOS  Skilled Therapeutic Interventions:  Skilled treatment session targeted pt's cognition goals. SLP facilitated session by providing Mod A cues to verbally organize and convey information related to discharge as well as activities he will need help with. Pt continues to require Mod A cues to recall information concisely and accurately. Cues are based on SLP's knowledge of discharge process and knowledge of pt. Pt left upright in bed, bed alarm on and all needs within reach.       Pain Pain Assessment Pain Scale: 0-10 Pain Score: 0-No pain Faces Pain Scale: No hurt  Therapy/Group: Individual Therapy  Aniah Pauli 11/11/2019, 1:13 PM

## 2019-11-11 NOTE — Progress Notes (Signed)
Physical Therapy Session Note  Patient Details  Name: [name] [name] MRN: [id number] Date of Birth: 12-Oct-1945  Today's Date: 11/11/2019 PT Individual Time: [phone] PT Individual Time Calculation (min): 40 min   Short Term Goals: Week 2:  PT Short Term Goal 1 (Week 2): STG = LTG d/t ELOS  Skilled Therapeutic Interventions/Progress Updates:     Patient received in bed, very pleasant and motivated to participate in PT today. Able to perform bed mobility with S and HOB elevated, then demonstrated heavy posterior lean sitting at EOB requiring MinA and mod VC to correct. Needed cues for safety throughout session. Needed ModA to stand initially today (faded to min guard during session) and MaxA to maintain upright due to strong posterior lean with max cues to correct; needed MinA with RW to perform transfers to/from [facility] due to same strong posterior lean. Due to difficulty maintaining upright balance while distracted by donning pants, chose to work on pipe puzzle in standing without BUE support to practice maintaining upright balance during functional task- able to do so with MinA and cues for upright but required Max cuing to get puzzle correct. Otherwise continued gait training, able to gait train approximately 113f with RW and min guard and was left up in his room with seatbelt alarm active and all needs otherwise met.   Therapy Documentation Precautions:  Precautions Precautions: Fall Restrictions Weight Bearing Restrictions: No   Pain: Pain Assessment Pain Scale: 0-10 Pain Score: 0-No pain Faces Pain Scale: No hurt    Therapy/Group: Individual Therapy   KWindell Norfolk DPT, PN1   Supplemental Physical Therapist [place]   Pager [phone] Rehab Office [phone]   11/11/2019, 11:12 AM

## 2019-11-11 NOTE — Progress Notes (Signed)
[place] PHYSICAL MEDICINE & REHABILITATION PROGRESS NOTE   Subjective/Complaints:  Patient without new complaints.  Slept well overnight.  No new nursing issues.  Family training going well per therapy staff.  ROS-  Pt denies SOB, abd pain, CP, N/V/C/D, and vision changes  Objective:   No results found. No results for input(s): WBC, HGB, HCT, PLT in the last 72 hours. No results for input(s): NA, K, CL, CO2, GLUCOSE, BUN, CREATININE, CALCIUM in the last 72 hours.  Intake/Output Summary (Last 24 hours) at 11/11/2019 1108 Last data filed at 11/11/2019 1025 Gross per 24 hour  Intake 800 ml  Output 200 ml  Net 600 ml     Physical Exam: Vital Signs Blood pressure 133/85, pulse (!) 55, temperature 98.7 F (37.1 C), resp. rate 16, height 5' 10"  (1.778 m), weight 81.8 kg, SpO2 98 %. General: No acute distress.   General: No acute distress Mood and affect are appropriate Heart: Regular rate and rhythm no rubs murmurs or extra sounds Lungs: Clear to auscultation, breathing unlabored, no rales or wheezes Abdomen: Positive bowel sounds, soft nontender to palpation, nondistended Extremities: No clubbing, cyanosis, or edema Skin: No evidence of breakdown, no evidence of rash Neurologic: Cranial nerves II through XII intact, motor strength is 5/5 in bilateral deltoid, bicep, tricep, grip, hip flexor, knee extensors, ankle dorsiflexor and plantar flexor  Musculoskeletal: Full range of motion in all 4 extremities. No joint swelling  Assessment/Plan: 1. 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  Physiatrist is providing close team supervision and 24 hour management of active medical problems listed below.  Physiatrist and rehab team continue to assess barriers to  discharge/monitor patient progress toward functional and medical goals  Care Tool:  Bathing    Body parts bathed by patient: Right arm, Left arm, Chest, Abdomen, Front perineal area, Right upper leg, Left upper leg, Face, Right lower leg, Left lower leg, Buttocks   Body parts bathed by helper: Buttocks     Bathing assist Assist Level: Contact Guard/Touching assist     Upper Body Dressing/Undressing Upper body dressing   What is the patient wearing?: Pull over shirt    Upper body assist Assist Level: Supervision/Verbal cueing    Lower Body Dressing/Undressing Lower body dressing      What is the patient wearing?: Underwear/pull up, Pants     Lower body assist Assist for lower body dressing: Minimal Assistance - Patient > 75%     Toileting Toileting    Toileting assist Assist for toileting: Minimal Assistance - Patient > 75% Assistive Device Comment: Urinal   Transfers Chair/bed transfer  Transfers assist     Chair/bed transfer assist level: Contact Guard/Touching assist Chair/bed transfer assistive device: Walker, Clinical biochemist   Ambulation assist      Assist level: Contact Guard/Touching assist Assistive device: Walker-rolling Max distance: 150f   Walk 10 feet activity   Assist  Walk 10 feet activity did not occur: Safety/medical concerns(decreased balance/activity tolerance)  Assist level: Contact Guard/Touching assist Assistive device: Walker-rolling   Walk 50 feet activity   Assist Walk 50 feet with 2 turns activity did not occur: Safety/medical concerns(decreased balance/activity tolerance)  Assist level: Contact Guard/Touching assist Assistive device: Walker-rolling    Walk 150 feet activity   Assist Walk 150 feet activity did not occur:  Safety/medical concerns(decreased balance/activity tolerance)  Assist level: Contact Guard/Touching assist Assistive device: Walker-rolling    Walk 10 feet on uneven surface   activity   Assist Walk 10 feet on uneven surfaces activity did not occur: Safety/medical concerns(decreased balance/activity tolerance)         Wheelchair     Assist   Type of Wheelchair: Manual    Wheelchair assist level: Supervision/Verbal cueing Max wheelchair distance: 30f    Wheelchair 50 feet with 2 turns activity    Assist        Assist Level: Supervision/Verbal cueing   Wheelchair 150 feet activity     Assist      Assist Level: Maximal Assistance - Patient 25 - 49%   Blood pressure 133/85, pulse (!) 55, temperature 98.7 F (37.1 C), resp. rate 16, height 5' 10"  (1.778 m), weight 81.8 kg, SpO2 98 %.  Medical Problem List and Plan: 1.  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 Formulized individual treatment plan based on medical history, underlying problem and comorbidities. 2.  Antithrombotics: -DVT/anticoagulation/pulmonary emboli: Eliquis             -antiplatelet therapy: N/A 3. Pain Management: Neurontin 300 mg nightly, Lidoderm patch. Well controlled.  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 Mood: Provide emotional support             -antipsychotic agents: N/A 5. Neuropsych: This patient is capable of making decisions on his own behalf. 6. Skin/Wound Care: Routine skin checks 7. Fluids/Electrolytes/Nutrition: Routine in and outs with follow-up chemistries 8.  Acute blood loss anemia.  Hgb stable on 3/7 9.  Hypertension.  Norvasc 10 mg daily, Coreg 25 mg twice daily, hydralazine as directed. Well controlled.  Vitals:   11/11/19  0547 11/11/19 0550  BP: 133/85 133/85  Pulse:  (!) 55  Resp:  16  Temp:  98.7 F (37.1 C)  SpO2:  98%  controlled  3/24  Asymptomatic brady- continues to be brady.   10.  BPH.  Flomax 11.  Diabetes mellitus.  Hemoglobin A1c 6.7.  SSI.  Patient on Glucophage 500 mg twice daily prior to admission.  Resume as needed CBG (last 3)  Recent Labs    11/10/19 1712 11/10/19 2057 11/11/19 0635  GLUCAP 111* 114* 102*  controlled 3/24  3/14- good control- con't meds 12.  Hyperlipidemia.  Lipitor 13. Constipation:   3/13 Type 6 BM , increase senna to 2 po BID  Used Mg Citrate at home  order 1 btl   Added prune juice with meals.  14. Pleural effusions:stable sized  15. Disposition: Lives with wife who is at home and will be able to help him. Has 3 daughters whom he is very close with who also live nearby.      LOS: 18 days A FACE TO FACE EVALUATION WAS PERFORMED  ACharlett Blake3/24/2021, 11:08 AM

## 2019-11-11 NOTE — Progress Notes (Signed)
Physical Therapy Session Note  Patient Details  Name: [name] [name] MRN: [id number] Date of Birth: December 07, 1945  Today's Date: 11/11/2019 PT Individual Time: 1004-1100 PT Individual Time Calculation (min): 56 min   Short Term Goals: Week 2:  PT Short Term Goal 1 (Week 2): STG = LTG d/t ELOS  Skilled Therapeutic Interventions/Progress Updates:   Pt received seated in WC and agreeable to therapy. No report of pain. Taken in [facility] to therapy gym for time management and energy conservation.   WC to mat transfer with stand-pivot and minA due to posterior lean. Pt then performs repeated sit to stand transfers to facilitate improved body mechanics and sequencing. Pt demonstrates tendency for posterior lean and frequently lacks eccentric control with stand>sit transition. x5 sets of x5 reps of sit to stand. Initially with use of RW and supervision. Progresses to using BUEs to push off thighs and knees to encourage weight shift and requires minA to complete. Progression to sit<>stand with arms folded across chest and requiring modA to complete.  Ambulation x150'. Initially requiring CGA for safety but with increased distance and fatigue gait pattern deteriorate with increased L sided and posterior lean and requiring modA.  Pt performs supine there-ex on mat table to strengthen core. Exercises include bridging x3 sets x10 reps, performing trunk rotations holding onto orange theraball, in horizontal, diagonal, and vertical patterns x10 reps.  Pt left supine in bed with all need within reach and alarm intact.  Precautions:  Precautions Precautions: Fall Restrictions Weight Bearing Restrictions: No    Therapy/Group: Individual Therapy  [name], PT, DPT 11/11/2019, 4:15 PM

## 2019-11-11 NOTE — Discharge Summary (Signed)
Physician Discharge Summary  Patient ID: [name] [name] MRN: [id number] DOB/AGE: [birth year] 74 y.o.  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 EXAM: ABDOMEN - 1 VIEW COMPARISON:  10/19/2019 FINDINGS: The patient has evacuated the majority of the contrast from the colon that was present on the prior study. No dilated bowel. Small amount of contrast remains in the rectum. No acute bone abnormality. IMPRESSION: Benign appearing abdomen.  Resolution of the ileus. Electronically Signed   By: Lorriane Shire M.D.   On: 10/21/2019 09:46   DG Abd 1 View  Result Date: 10/18/2019 CLINICAL DATA:  Abdominal distention. EXAM: ABDOMEN - 1 VIEW COMPARISON:  10/16/2019 FINDINGS: Diffuse gaseous distention of small bowel and colon again. Noted cecum is more decompressed than on the prior study in there is gas visible in the left colon today. Thoracic aortic stent graft incompletely visualized. Patient is status post lower lumbar fusion. IMPRESSION: Mild gaseous distention of small bowel and colon. Imaging features are not frankly suggestive of an obstructive pattern and features may reflect ileus. Electronically Signed   By: Misty Stanley M.D.   On: 10/18/2019 11:01   DG Abd 1 View  Result Date: 10/16/2019 CLINICAL DATA:  Ileus. EXAM: ABDOMEN - 1 VIEW COMPARISON:  CT scan dated 10/13/2019 FINDINGS: There is slight gaseous distention of the ascending and transverse portions of the colon with air scattered throughout the small bowel and nondistended distal colon. There is air in a small bowel loop in the right inguinal hernia. No evidence of free air or free fluid on  the supine radiograph. No acute bone abnormality. IMPRESSION: Slight gaseous distention of the proximal colon consistent with an ileus. Electronically Signed   By: Lorriane Shire M.D.   On: 10/16/2019 11:55   CT HEAD WO CONTRAST  Result Date: 10/17/2019 CLINICAL DATA:  Altered mental status with unclear cause EXAM: CT HEAD WITHOUT CONTRAST TECHNIQUE: Contiguous axial images were obtained from the base of the skull through the vertex without intravenous contrast. COMPARISON:  MRI of the brain from 2011 FINDINGS: Brain: Signs of atrophy and chronic microvascular ischemic change. No signs of hydrocephalus, midline shift, mass effect or intracranial hemorrhage. Subtle area of gray-white differentiation loss in the posterior right occipital lobe extending from the white matter to cortex, not associated with mass effect Subtle hypodensity also in the right cerebellum, inferiorly in the inferior cerebellar peduncle. Vascular: No hyperdense vessel or unexpected calcification. Skull: Normal. Negative for fracture or focal lesion. Sinuses/Orbits: Visualized paranasal sinuses and orbits are unremarkable. Other: None IMPRESSION: Subtle area of gray-white differentiation loss in the posterior right occipital lobe extending from the white matter to cortex, not associated with mass effect. Subtle hypodensity also in the right cerebellum, inferiorly in the inferior cerebellar peduncle. Findings are suspicious for acute to subacute infarct, in particular the area in the right occipital lobe. Signs of generalized atrophy and chronic white matter disease. These results were called by telephone at the time of interpretation on 10/17/2019 at 12:17 pm to provider Dr. Loanne Drilling , who verbally acknowledged these results. Electronically Signed   By: Zetta Bills M.D.   On: 10/17/2019 12:18   MR ANGIO HEAD WO CONTRAST  Result Date: 10/18/2019 CLINICAL DATA:  Stroke  follow-up. Embolic infarcts in the setting of recent thoracic aortic  dissection repair. EXAM: MRA HEAD WITHOUT CONTRAST TECHNIQUE: Angiographic images of the Circle of Willis were obtained using MRA technique without intravenous contrast. COMPARISON:  04/09/2010 FINDINGS: The study is mildly to moderately motion degraded. The visualized distal vertebral arteries are patent to the basilar with the left being strongly dominant. The basilar artery is widely patent. Posterior communicating arteries are diminutive or absent. PCAs are patent without evidence of significant proximal stenosis on the right. A left P1 stenosis is again seen, moderate on the prior MRA and not felt to have progressed within limitations of motion artifact today. The internal carotid arteries are patent from skull base to carotid termini without evidence of significant stenosis allowing for artifact. ACAs and MCAs are patent without evidence of proximal branch occlusion or significant proximal stenosis. No aneurysm is identified. IMPRESSION: 1. Motion degraded examination without medium or large vessel occlusion or significant proximal anterior circulation stenosis. 2. Chronic moderate left P1 PCA stenosis. Electronically Signed   By: Logan Bores M.D.   On: 10/18/2019 17:19   MR BRAIN WO CONTRAST  Result Date: 10/17/2019 CLINICAL DATA:  Confusion. Recent endovascular repair of a thoracic aortic dissection. EXAM: MRI HEAD WITHOUT CONTRAST TECHNIQUE: Multiplanar, multiecho pulse sequences of the brain and surrounding structures were obtained without intravenous contrast. COMPARISON:  Head CT 10/17/2019 and MRI 04/09/2010 FINDINGS: The study is mildly motion degraded. Brain: Acute infarcts measure 2.5 cm in the superior right occipital lobe and 1 cm at the lateral left temporal-occipital junction. Additional subcentimeter acute infarcts are present in the right parietal and occipital lobes, left internal capsule and bilateral cerebellum. No intracranial hemorrhage, mass, midline shift, or extra-axial fluid  collection is identified. T2 hyperintensities in the cerebral white matter bilaterally have mildly progressed from the prior MRI and are nonspecific but compatible with moderate chronic small vessel ischemic disease. Chronic infarcts in the left centrum semiovale and corpus callosum are unchanged. There is moderate cerebral atrophy. Vascular: Major intracranial vascular flow voids are preserved. Skull and upper cervical spine: Unremarkable bone marrow signal. Sinuses/Orbits: Paranasal sinuses and mastoid air cells are clear. Unremarkable orbits. Other: None. IMPRESSION: 1. Small acute bilateral cerebral and cerebellar infarcts consistent with emboli. 2. Moderate chronic small vessel ischemic disease. Electronically Signed   By: Logan Bores M.D.   On: 10/17/2019 14:21   MR LUMBAR SPINE WO CONTRAST  Result Date: 10/22/2019 CLINICAL DATA:  Low back pain. Evaluate neurologic compression. EXAM: MRI LUMBAR SPINE WITHOUT CONTRAST TECHNIQUE: Multiplanar, multisequence MR imaging of the lumbar spine was performed. No intravenous contrast was administered. COMPARISON:  MRI of the lumbar spine March 06, 2018 and January 23, 2013 FINDINGS: Segmentation: A transitional lumbosacral vertebra is assumed to represent the L5 level. Careful correlation with this numbering strategy prior to any procedural intervention would be recommended. Alignment:  Physiologic. Vertebrae: Interbody fusion and posterior left transpedicular fixation is noted at L3-4 with laminectomy at L4-5. Degenerative endplate changes are noted at L3-4. Marrow signal characteristics are otherwise maintained. Conus medullaris and cauda equina: Conus extends to the T12-L1 level. Conus appear normal. Redundancy of nerve roots at the level of L1 is related to L2-3 spinal canal stenosis. Paraspinal and other soft tissues: Well-defined oval lesion located to the left of midline in the prevertebral soft tissue at the level of L5-S1 is seen in the sagittal views. The lesion  as predominantly hypointense signal on T1 and heterogeneous signal on T2. When compared to prior study,  the lesion appears with similar size (3.7 x 2.7 cm) when compared to study performed 2019 but enlarged when compared to MRI performed 2014. T2 signal appear changed when compared to prior study. 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 Right renal cysts. Disc levels: T12-L1: No spinal canal or neural foraminal stenosis. L1-2: No spinal canal or neural foraminal stenosis. L2-3: Disc bulge with superimposed tiny right central disc protrusion, facet degenerative changes and ligamentum flavum redundancy resulting in moderate to severe spinal canal stenosis, moderate right and severe left neural foraminal stenosis. L3-4: Laminectomy with facet fusion. No spinal canal or neural foraminal stenosis. L4-5: Disc bulge with tiny superimposed central disc protrusion and mild facet degenerative changes result in severe bilateral neural foraminal narrowing. Laminectomy is also noted with no spinal canal stenosis. L5-S1: No spinal canal or neural foraminal stenosis. Compared to prior study, there has been significant progression of the degree of spinal canal stenosis at L2-3. IMPRESSION: 1. 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 This has progressed when compared to prior MRI. 2. Well-defined oval lesion the prevertebral soft tissues to the left of midline at the L5-S1 level, only seen on the sagittal images. This appear with similar size although slightly different T2 signal when compared to study performed 2019 but enlarged when compared to study performed in 2014. 3. 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 Recommend followup by ultrasound in 2 years. This recommendation follows ACR consensus guidelines: White  Paper of the ACR Incidental Findings Committee II on Vascular Findings. J Am Coll Radiol 2013; 10: Electronically Signed   By: Pedro Earls M.D.   On: 10/22/2019 14:06   DG CHEST PORT 1 VIEW  Result Date: 10/25/2019 CLINICAL DATA:  Shortness of breath. 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 Right PICC tip near the superior cavoatrial junction without significant change. Stable enlarged cardiac silhouette. Clear right lung. Moderate-sized left pleural effusion and left basilar airspace opacity with little change. No acute bony abnormality. IMPRESSION: 1. Stable moderate-sized left pleural effusion and left basilar atelectasis or pneumonia. 2. Stable cardiomegaly. Electronically Signed   By: Claudie Revering M.D.   On: 10/25/2019 10:49   DG CHEST PORT 1 VIEW  Result Date: 10/20/2019 CLINICAL DATA:  Shortness of breath. Recent aortic stent graft. EXAM: PORTABLE CHEST 1 VIEW COMPARISON:  10/15/2019 FINDINGS: The thoracic aortic stent graft appears stable. The right PICC line is stable. The right IJ central venous catheter has been removed. Persistent left lower lobe process, likely a combination of left pleural effusion and left lower lobe atelectasis. No findings for pulmonary edema. The right lung remains clear. IMPRESSION: Persistent left lower lobe process, likely a combination of effusion and atelectasis. Electronically Signed   By: Marijo Sanes M.D.   On: 10/20/2019 14:36   DG CHEST PORT 1 VIEW  Result Date: 10/15/2019 CLINICAL DATA:  Post central line insertion EXAM: PORTABLE CHEST 1 VIEW COMPARISON:  Portable exam 1235 hours compared to 10/08/2019 FINDINGS: New RIGHT jugular central venous catheter with tip projecting over SVC. RIGHT arm PICC line tip projects over cavoatrial junction. Additional nonspecific tubing traverses the RIGHT hemithorax. Interval endoluminal stenting of a tortuous and mildly  aneurysmal descending thoracic aorta. LEFT pleural effusion and basilar atelectasis. Upper lungs clear. IMPRESSION: No pneumothorax  following RIGHT jugular line placement. Small to moderate LEFT pleural effusion and basilar atelectasis. Electronically Signed   By: Lavonia Dana M.D.   On: 10/15/2019 12:56   DG Abd Portable 1V-Small Bowel Obstruction Protocol-initial, 8 hr delay  Result Date: 10/19/2019 CLINICAL DATA:  Follow up small bowel obstruction EXAM: PORTABLE ABDOMEN - 1 VIEW COMPARISON:  10/18/2019 FINDINGS: Scattered large and small bowel gas is noted. Recently administered contrast material now lies almost entirely within the colon. A minimal amount in the distal small bowel is seen. Postsurgical changes are noted in the descending thoracic aorta and lower lumbar spine. No free air is seen. IMPRESSION: Recently administered contrast material now lies almost entirely within the colon. No obstructive changes are seen. Electronically Signed   By: Inez Catalina M.D.   On: 10/19/2019 19:52   VAS US CAROTID  Result Date: 10/18/2019 Carotid Arterial Duplex Study Indications:       Type B aortic dissection, S/P TEVAR. Risk Factors:      Hypertension, hyperlipidemia, past history of smoking. Comparison Study:  No prior study on file for comparison. Performing Technologist: Sharion Dove RVS  Examination Guidelines: A complete evaluation includes B-mode imaging, spectral Doppler, color Doppler, and power Doppler as needed of all accessible portions of each vessel. Bilateral testing is considered an integral part of a complete examination. Limited examinations for reoccurring indications may be performed as noted.  Right Carotid Findings: +----------+--------+--------+--------+------------------+------------------+           PSV cm/sEDV cm/sStenosisPlaque DescriptionComments           +----------+--------+--------+--------+------------------+------------------+ CCA Prox  143     19                                 intimal thickening +----------+--------+--------+--------+------------------+------------------+ CCA Distal88      20                                intimal thickening +----------+--------+--------+--------+------------------+------------------+ ICA Prox  68      20              heterogenous                         +----------+--------+--------+--------+------------------+------------------+ ICA Distal55      17                                                   +----------+--------+--------+--------+------------------+------------------+ ECA       140     20                                                   +----------+--------+--------+--------+------------------+------------------+ +----------+--------+-------+--------+-------------------+           PSV cm/sEDV cmsDescribeArm Pressure (mmHG) +----------+--------+-------+--------+-------------------+ [id number]                                         +----------+--------+-------+--------+-------------------+ +---------+--------+--+--------+-+ VertebralPSV cm/s35EDV cm/s9 +---------+--------+--+--------+-+  Left Carotid Findings: +----------+--------+--------+--------+------------------+------------------+  PSV cm/sEDV cm/sStenosisPlaque DescriptionComments           +----------+--------+--------+--------+------------------+------------------+ CCA Prox  104     22                                intimal thickening +----------+--------+--------+--------+------------------+------------------+ CCA Distal86      21                                intimal thickening +----------+--------+--------+--------+------------------+------------------+ ICA Prox  76      18              heterogenous                         +----------+--------+--------+--------+------------------+------------------+ ICA Distal79      19                                                    +----------+--------+--------+--------+------------------+------------------+ ECA       102     11                                                   +----------+--------+--------+--------+------------------+------------------+ +----------+--------+--------+--------+-------------------+           PSV cm/sEDV cm/sDescribeArm Pressure (mmHG) +----------+--------+--------+--------+-------------------+ [id number]                                          +----------+--------+--------+--------+-------------------+ +---------+--------+--+--------+--+ VertebralPSV cm/s47EDV cm/s12 +---------+--------+--+--------+--+   Summary: Right Carotid: Velocities in the right ICA are consistent with a 1-39% stenosis. Left Carotid: Velocities in the left ICA are consistent with a 1-39% stenosis. Vertebrals:  Bilateral vertebral arteries demonstrate antegrade flow. Subclavians: Normal flow hemodynamics were seen in bilateral subclavian              arteries. *See table(s) above for measurements and observations.  Electronically signed by Servando Snare MD on 10/18/2019 at 10:46:09 AM.    Final    HYBRID OR IMAGING ([facility])  Result Date: 10/15/2019 There is no interpretation for this exam.  This order is for images obtained during a surgical procedure.  Please See "Surgeries" Tab for more information regarding the procedure.    Labs:  Basic Metabolic Panel: No results for input(s): NA, K, CL, CO2, GLUCOSE, BUN, CREATININE, CALCIUM, MG, PHOS in the last 168 hours.  CBC: No results for input(s): WBC, NEUTROABS, HGB, HCT, MCV, PLT in the last 168 hours.  CBG: Recent Labs  Lab 11/11/19 2104 11/12/19 0548 11/12/19 1150 11/12/19 1634 11/12/19 2039  GLUCAP 105* 98 128* 106* 137*   Family history.  Mother and father with hypertension as well as hyperlipidemia and diabetes mellitus.  Denies colon cancer esophageal cancer or rectal cancer  Brief HPI:   [name] [name] is a 74 y.o. right-handed male  with history of chronic back pain with lumbar discectomy 2014 as well as anterior lateral lumbar fusion 2016, hypertension, diabetes mellitus, hyperlipidemia and remote tobacco abuse.  Per chart review  lives with spouse independent prior to admission.  Presented 10/08/2019 with lower chest and back pain with epigastric discomfort times several weeks as well as bouts of nausea and vomiting.  In the ED blood pressure 180s/130s.  EKG showed nonspecific ST-T changes not significantly different from prior EKG 2016.  Troponin high-sensitivity 28, potassium 3.3, creatinine 1.25, hemoglobin 14.5, SARS coronavirus negative.  Chest x-ray no active disease.  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  Small pulmonary embolus involving the central right lobe pulmonary artery as well as probable additional small subsegmental embolus in the right lower lobe.  Echocardiogram with ejection fraction 65%.  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 Donzetta Matters.  Placed on intravenous heparin therapy.  Neurology consulted 10/17/2019 for altered mental status.  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  Carotid Dopplers with no ICA stenosis.  Neurology follow-up heparin was transitioned to Eliquis.  Hospital course blood loss anemia 9.3 follow-up GI services there was some fear of possible GI bleed related to intravenous heparin as well as transition to Eliquis no overt bleeding noted hemoglobin hematocrit remained stable no further work-up indicated.  Patient was admitted for a comprehensive rehab program.   Hospital Course: [name] [name] was admitted to rehab 10/24/2019 for inpatient therapies to consist of PT, ST and OT at least three hours five days a week. Past admission physiatrist, therapy team and rehab RN  have worked together to provide customized collaborative inpatient rehab.  Pertaining to patient's bilateral cerebral and cerebellar infarcts status post thoracic endograft for acute type B aortic dissection remained stable maintained on Eliquis therapy patient would follow-up with neurology service as well as vascular surgery.  Pain management the use of Neurontin scheduled as well as a Lidoderm patch and good results.  Acute blood loss anemia with latest hemoglobin 10.1 no bleeding episodes.  Blood pressure control with Coreg 25 mg twice daily, hydralazine 10 mg every 6 hours as well as Norvasc with no orthostatic changes.  Blood sugars controlled with hemoglobin A1c of 6.7.  Patient had been on Glucophage prior to admission.  BPH Flomax no difficulty in voiding.  Crestor ongoing for hyperlipidemia   Blood pressures were monitored on TID basis and controlled  Diabetes has been monitored with ac/hs CBG checks and SSI was use prn for tighter BS control.   He/ is continent of bowel and bladder.  He/ has made gains during rehab stay and is attending therapies  He/ will continue to receive follow up therapies   after discharge  Rehab course: During patient's stay in rehab weekly team conferences were held to monitor patient's progress, set goals and discuss barriers to discharge. At admission, patient required moderate assist ambulate 15 feet rolling walker, moderate assist sit to stand, max assist sit to supine.  Moderate assist upper body bathing minimal assist upper body dressing total assist lower body dressing moderate assist toilet transfers  Physical exam.  Blood pressure 142/81 pulse 74 temperature 99 respiration 20 oxygen saturation 95% room air General.  Alert no acute distress HEENT Head.  Normocephalic and atraumatic Eyes.  Pupils round and reactive to light no discharge.nystagmus Neck.  Supple nontender no JVD without thyromegaly Cardiac regular rate rhythm without any extra sounds or  murmur heard Abdomen.  Soft nontender positive bowel sounds without rebound Respiratory effort normal no  respiratory distress without wheeze Neurological.  Alert and oriented insight appropriate.  Cranial nerves II through XII intact 4+ out of 5 strength throughout.  He/  has had improvement in activity tolerance, balance, postural control as well as ability to compensate for deficits. He/ has had improvement in functional use RUE/LUE  and RLE/LLE as well as improvement in awareness.  Supine to sit head of bed elevated supervision.  Sitting edge of bed with supervision for sitting balance.  Donned shoes minimal assist stand pivot to the right using wheelchair rolling walker contact-guard assist.  Sit to stand using rolling walker contact-guard assist.  Ambulates 145 feet using rolling walker contact-guard assist.  Patient completed supine to sit edge of bed with supervision for ADLs.  Overall minimal assist for balance needed with minimal assist for pulling items over his hips.  Full family teaching was completed plan discharge to home       Disposition: Discharged home    Diet: Heart healthy  Special Instructions: No driving smoking or alcohol  Glucophage currently on hold with blood sugars acceptable.  Follow-up with primary care provider  Medications at discharge 1.  Tylenol as needed 2.  Norvasc 5 mg p.o. daily 3.  Eliquis 5 mg p.o. twice daily 4.  Aspirin 81 mg p.o. daily 5.  Coreg 25 mg p.o. twice daily 6.  Neurontin 300 mg p.o. nightly 7.  Hydralazine 10 mg every 6 hours 8.  Lidoderm patch change as directed 9.  Protonix 40 mg p.o. daily 10.  MiraLAX daily hold for loose stools 11.  Senokot-S twice daily hold for loose stools 12.  Crestor 10 mg p.o. daily 13.  Flomax 0.4 mg p.o. daily  Discharge Instructions    Ambulatory referral to Neurology   Complete by: As directed    An appointment is requested in approximately 4 weeks bilateral cerebral infarction   Ambulatory  referral to Physical Medicine Rehab   Complete by: As directed    Moderate complexity follow-up 1 to 2 weeks bilateral cerebral infarction      Follow-up Information    Kirsteins, Luanna Salk, MD Follow up.   Specialty: Physical Medicine and Rehabilitation Why: Office to call for appointment Contact information: [place] [address] [phone]        Waynetta Sandy, MD Follow up.   Specialties: Vascular Surgery, Cardiology Why: Call for appointment Contact information: [place] [address] [phone]           Signed: Cathlyn Parsons 11/13/2019, 5:16 AM

## 2019-11-11 NOTE — Patient Care Conference (Signed)
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 History of repair of dissecting thoracic aneurysm   . Abdominal distension   . Pulmonary embolus ([place]) 10/19/2019  . CVA (cerebral vascular accident) ([place]) 10/19/2019  . AKI (acute kidney injury) ([place]) 10/19/2019  . 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 Ileus ([place]) 10/19/2019  . 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 Radiculopathy 12/15/2014    Expected Discharge Date: Expected Discharge Date: 11/13/19  Team Members Present: Physician leading conference: Dr. Alysia Penna Care Coodinator Present: Karene Fry, RN, MSN;Deborah Tobin Chad, RN, BSN, CRRN Nurse Present: Dorthula Nettles, RN PT Present: Michaelene Song, PT OT Present: Clyda Greener, OT SLP Present: Jettie Booze, CF-SLP PPS Coordinator present : Ileana Ladd, Burna Mortimer, SLP     Current Status/Progress Goal Weekly Team Focus  Bowel/Bladder   Continent of Bowel occasional urinary incontinence sometimes spills urinal  continent of B B LBM 03/20  toilet prn   Swallow/Nutrition/ Hydration             ADL's   Pt completes UB selfcare with supervision, he needs min assist for LB selfcare with supervision as well.  Transfers are min assist to the walk-in shower and toilet.  Still with  significant cognitive deficit regarding simple problem solving.  supervision overall  selfcare retraining, balance retraining, transfer retraining, DME education, neuromuscular re-education   Mobility   supervision bed mobility, min-modA stand pivot transfer with and without RW, minA gait up to 190' with RW, min-mod A gait without AD 50-80 ft, minA and B rails for 4 steps, has done litegait, continued significant posterior lean  CGA-supervision transfers, CGA gait, minA stair negotiation  posterior chain LE strengthening, transfers, gait, stair training, patient/caregiver edu, sequencing and initiation, coordination, standing balance   Communication             Safety/Cognition/ Behavioral Observations  slow processing, Mod-Max A basic problem solving, error awareness, recall with strategies Min sustained attention, educaiton with wife ongoing  Min-Mod  basic problem solving, recall strategies, finish education   Pain      denies  assess q shift   Skin   intact  remain intact free of infection  assess qshift and prn    Rehab Goals Patient on target to meet rehab goals: Yes *See Care Plan and progress notes for long and short-term goals.     Barriers to Discharge  Current Status/Progress Possible Resolutions Date Resolved   Nursing                  PT                    OT                  SLP  SW Home environment access/layout;Decreased caregiver support Home with wife, limited access to B+B OP therapy scheduled after discharge home with wife and family to assist Prn          Discharge Planning/Teaching Needs:  Home with family  Transfers, toileting, medications, etc   Team Discussion: MD Med stable.  RN cont B/B, BS good.  OT s UB B/D, min guard LB B/D, LOB posteriorly, transfers min/minguard toilet and shower.  PT min A overall, limited cognitively, poor carry over, fam ed tom.  SLP mod/max prob solving, goals mod A.  Wife has been here.   Revisions to  Treatment Plan: N/A     Medical Summary Current Status: Wife has been undergoing family training.  Continues with constipation but is continent. Weekly Focus/Goal: Discharge planning this week, complete equipment planning  Barriers to Discharge: Medical stability   Possible Resolutions to Barriers: Continue rehab see above, optimize bowel program   Continued Need for Acute Rehabilitation Level of Care: The patient requires daily medical management by a physician with specialized training in physical medicine and rehabilitation for the following reasons: Direction of a multidisciplinary physical rehabilitation program to maximize functional independence : Yes Medical management of patient stability for increased activity during participation in an intensive rehabilitation regime.: Yes Analysis of laboratory values and/or radiology reports with any subsequent need for medication adjustment and/or medical intervention. : Yes   I attest that I was present, lead the team conference, and concur with the assessment and plan of the team.   Retta Diones 11/11/2019, 4:56 PM   Team conference was held via web/ teleconference due to [place] - 19

## 2019-11-11 NOTE — Progress Notes (Signed)
Occupational Therapy Weekly Progress Note  Patient Details  Name: [name] [name] MRN: [id number] Date of Birth: 1945/08/23  Beginning of progress report period: November 03, 2019 End of progress report period: November 11, 2019  Today's Date: 11/11/2019 OT Individual Time: 8115-7262 OT Individual Time Calculation (min): 54 min    Patient has met 2 of 4 short term goals.  [name] [name] is making steady progress with OT at this time.  He continues to exhibit static and dynamic standing balance deficits with selfcare tasks requiring min to min guard assist for balance.  Toilet transfers and shower transfers are also a min guard to min assist.  At times his balance is worse, especially in the morning when first getting up.  He can sit EOB and complete dressing tasks with close supervision but at other times he may exhibit LOB posteriorly and to the left, requiring min guard assist and use of his UEs for support.  He continues to need overall mod questioning cueing to sequence through bathing and dressing tasks with mod instructional cueing for hand placement with sit to stand for not trying to pull up on the walker.  His diplopia has resolved however he still demonstrates decreased convergence with the left eye moving laterally at times.  He is on target for discharge on 3/26 with spouse.  He will need 24 hr supervision for safety as well as he continues to also demonstrate decreased awareness as well as decrease cognitive processing and memory.  Will provide family education with his wife tomorrow.    Patient continues to demonstrate the following deficits: muscle weakness, impaired timing and sequencing and decreased coordination, convergence deficits, decreased awareness, decreased problem solving, decreased memory and delayed processing and decreased sitting balance, decreased standing balance, decreased postural control and decreased balance strategies and therefore will continue to benefit from skilled OT  intervention to enhance overall performance with BADL and Reduce care partner burden.  Patient progressing toward long term goals..  Continue plan of care.  OT Short Term Goals Week 3:  OT Short Term Goal 1 (Week 3): Continue working on established LTGs set at min to min guard assist.  Skilled Therapeutic Interventions/Progress Updates:    Pt completed shower and dressing during session.  Supervision was needed for supine to sit EOB with min assist for functional mobility to the bathroom with use of the RW for support, secondary to posterior LOB.  He was able to sit on the 3:1 and remove all clothing except for TEDs with min guard assist and then transfer over to the shower bench with at the same level with use of the grab bars.  He completed all bathing and drying off with min instructional cueing for thoroughness.  Next, he transferred out to the wheelchair with min guard assist for dressing and grooming tasks.  Setup was needed for oral care and brushing his hair as well as for donning a pullover shirt.  Min guard was needed for donning his brief, pants, and shoes.  Therapist assisted with donning TEDs at total assist.  Finished session with call button and phone in reach with safety alarm belt in place.    Therapy Documentation Precautions:  Precautions Precautions: Fall Restrictions Weight Bearing Restrictions: No   Pain: Pain Assessment Pain Scale: Faces Pain Score: 0-No pain ADL: See Care Tool Section for some details of mobility and selfcare  Therapy/Group: Individual Therapy  Chellsea Beckers,Kamilo OTR/L 11/11/2019, 4:26 PM

## 2019-11-12 ENCOUNTER — Encounter (HOSPITAL_COMMUNITY): Payer: Medicare Other | Admitting: Occupational Therapy

## 2019-11-12 ENCOUNTER — Ambulatory Visit (HOSPITAL_COMMUNITY): Payer: Medicare Other | Admitting: Physical Therapy

## 2019-11-12 ENCOUNTER — Encounter (HOSPITAL_COMMUNITY): Payer: Medicare Other | Admitting: Speech Pathology

## 2019-11-12 ENCOUNTER — Inpatient Hospital Stay (HOSPITAL_COMMUNITY): Payer: Medicare Other | Admitting: Physical Therapy

## 2019-11-12 LAB — GLUCOSE, CAPILLARY
Glucose-Capillary: 98 mg/dL (ref 70–99)
Glucose-Capillary: 128 mg/dL — ABNORMAL HIGH (ref 70–99)
Glucose-Capillary: 106 mg/dL — ABNORMAL HIGH (ref 70–99)
Glucose-Capillary: 137 mg/dL — ABNORMAL HIGH (ref 70–99)

## 2019-11-12 MED ORDER — SENNOSIDES-DOCUSATE SODIUM 8.6-50 MG PO TABS: 3.0000 | ORAL_TABLET | Freq: Two times a day (BID) | ORAL | Status: DC

## 2019-11-12 MED ORDER — HYDRALAZINE HCL 10 MG PO TABS: 10.0000 mg | ORAL_TABLET | Freq: Four times a day (QID) | ORAL | 0 refills | Status: DC

## 2019-11-12 MED ORDER — LIDOCAINE 5 % EX PTCH: 1.0000 | MEDICATED_PATCH | CUTANEOUS | 0 refills | Status: DC

## 2019-11-12 MED ORDER — GABAPENTIN 600 MG PO TABS: 300.0000 mg | ORAL_TABLET | Freq: Every day | ORAL | 0 refills | Status: DC

## 2019-11-12 MED ORDER — PANTOPRAZOLE SODIUM 40 MG PO TBEC: 40.0000 mg | DELAYED_RELEASE_TABLET | Freq: Every day | ORAL | 0 refills | Status: DC

## 2019-11-12 MED ORDER — CARVEDILOL 25 MG PO TABS: 25.0000 mg | ORAL_TABLET | Freq: Two times a day (BID) | ORAL | 0 refills | Status: DC

## 2019-11-12 MED ORDER — TAMSULOSIN HCL 0.4 MG PO CAPS: 0.4000 mg | ORAL_CAPSULE | Freq: Every day | ORAL | 0 refills | Status: DC

## 2019-11-12 MED ORDER — AMLODIPINE BESYLATE 5 MG PO TABS: 5.0000 mg | ORAL_TABLET | Freq: Every day | ORAL | 0 refills | Status: DC

## 2019-11-12 MED ORDER — POLYETHYLENE GLYCOL 3350 17 G PO PACK: 17.0000 g | PACK | Freq: Every day | ORAL | 0 refills | Status: DC

## 2019-11-12 MED ORDER — ACETAMINOPHEN 325 MG PO TABS: 650.0000 mg | ORAL_TABLET | Freq: Four times a day (QID) | ORAL | Status: DC | PRN

## 2019-11-12 MED ORDER — ROSUVASTATIN CALCIUM 10 MG PO TABS: 10.0000 mg | ORAL_TABLET | Freq: Every day | ORAL | 0 refills | Status: DC

## 2019-11-12 NOTE — Progress Notes (Signed)
Occupational Therapy Discharge Summary  Patient Details  Name: [name] [name] MRN: [id number] Date of Birth: August 28, 1945  Today's Date: 11/12/2019 OT Individual Time: 1440-1531 OT Individual Time Calculation (min): 51 min   Session Note:  Pt's spouse present for session.  Worked on educating spouse on pt's current level of assist for bathing, dressing, toileting, and shower transfers.  Had pt's spouse assist him with toilet transfers as well as tub shower transfers with use of the tub bench and the RW.  He was able to complete both at min guard assist.  She was also educated on pt's cognitive level and the need to use questioning cueing during selfcare tasks to help him problem solve and sequence through tasks.  Finished session with education on visual convergence exercises for home.  Pt left in the wheelchair with call button and phone in reach and spouse in room.  Safety belt left off per spouse as she will continue to supervise him.    Patient has met 10 of 11 long term goals due to improved activity tolerance, improved balance, postural control, ability to compensate for deficits, improved attention, improved awareness and improved coordination.  Patient to discharge at overall Supervision level.  Patient's care partner is independent to provide the necessary physical and cognitive assistance at discharge.    Reasons goals not met: Pt needs assist with donning TEDs during LB dressing  Recommendation:  Patient will benefit from ongoing skilled OT services in outpatient setting to continue to advance functional skills in the area of BADL and Reduce care partner burden.  Pt continues to demonstrate decreased cognitive processing as well as decreased dynamic standing balance for ADL functions.  Feel he will benefit from continued OT to address these deficits.     Equipment: tub bench and 3:1  Reasons for discharge: treatment goals met and discharge from hospital  Patient/family agrees with  progress made and goals achieved: Yes  OT Discharge Precautions/Restrictions  Precautions Precautions: Fall Restrictions Weight Bearing Restrictions: No   Vital Signs Therapy Vitals Temp: 97.7 F (36.5 C) Pulse Rate: (!) 58 Resp: 17 BP: 102/65 Patient Position (if appropriate): Sitting Oxygen Therapy SpO2: 98 % O2 Device: Room Air Pain Pain Assessment Pain Scale: Faces Pain Score: 0-No pain ADL ADL Eating: Independent Grooming: Independent Where Assessed-Grooming: Sitting at sink Upper Body Bathing: Setup Where Assessed-Upper Body Bathing: Shower Lower Body Bathing: Contact guard Where Assessed-Lower Body Bathing: Shower Upper Body Dressing: Setup Where Assessed-Upper Body Dressing: Sitting at sink Lower Body Dressing: Moderate assistance Where Assessed-Lower Body Dressing: Sitting at sink, Standing at sink Toileting: Contact guard Where Assessed-Toileting: Bedside Commode Toilet Transfer: Contact guard Toilet Transfer Method: Counselling psychologist: Bedside commode Tub/Shower Transfer: Metallurgist Method: Optometrist: Facilities manager: Curator Method: Heritage manager: Civil engineer, contracting with back Vision Baseline Vision/History: Wears glasses Wears Glasses: At all times Vision Assessment?: Yes Eye Alignment: Within Functional Limits Ocular Range of Motion: Within Functional Limits Alignment/Gaze Preference: Within Defined Limits Tracking/Visual Pursuits: Able to track stimulus in all quads without difficulty Saccades: Within functional limits Convergence: Impaired (comment)(Decreased convergence strength in the left eye with deviation laterally) Visual Fields: No apparent deficits Perception  Perception: Within Functional Limits Praxis Praxis: Intact Praxis Impairment Details: Motor planning;Initiation Praxis-Other Comments:  moderately impaired, slightly improved Cognition Overall Cognitive Status: Impaired/Different from baseline Arousal/Alertness: Awake/alert Orientation Level: Oriented X4 Attention: Sustained;Selective Sustained Attention: Impaired Sustained Attention Impairment: Functional basic;Verbal basic Selective  Attention: Impaired Memory: Impaired Decreased Short Term Memory: Verbal basic;Functional basic Problem Solving: Impaired Problem Solving Impairment: Functional basic;Verbal basic Executive Function: Initiating Initiating: Impaired Initiating Impairment: Verbal basic;Functional basic Safety/Judgment: Appears intact Comments: Pt with decreased sequencing and cognitive processing noted with selfcare tasks, requiring cueing to correct. Sensation Sensation Light Touch: Appears Intact Hot/Cold: Appears Intact Proprioception: Appears Intact Stereognosis: Appears Intact Coordination Gross Motor Movements are Fluid and Coordinated: No Fine Motor Movements are Fluid and Coordinated: No Coordination and Movement Description: slower movement patterns noted with mobility as well as with UE use. Motor  Motor Motor: Abnormal postural alignment and control Motor - Discharge Observations: Pt with increased lean to the left in standing and with mobility.  Increased LOB posteriorly as well is noted. Mobility  Bed Mobility Bed Mobility: Rolling Right;Rolling Left;Supine to Sit;Sit to Supine Rolling Right: Supervision/verbal cueing Rolling Left: Supervision/Verbal cueing Supine to Sit: Supervision/Verbal cueing Sit to Supine: Supervision/Verbal cueing Transfers Sit to Stand: Supervision/Verbal cueing Stand to Sit: Supervision/Verbal cueing  Trunk/Postural Assessment  Cervical Assessment Cervical Assessment: Exceptions to WFL(slight forward head) Thoracic Assessment Thoracic Assessment: Within Functional Limits Lumbar Assessment Lumbar Assessment: Exceptions to WFL(posterior pelvic tilt in  sitting) Postural Control Trunk Control: posterior lean in sitting, posterior and mild L lean in standing  Balance Balance Balance Assessed: Yes Static Sitting Balance Static Sitting - Balance Support: Feet supported Static Sitting - Level of Assistance: 5: Stand by assistance Dynamic Sitting Balance Dynamic Sitting - Balance Support: During functional activity Dynamic Sitting - Level of Assistance: 5: Stand by assistance Static Standing Balance Static Standing - Balance Support: During functional activity Static Standing - Level of Assistance: 5: Stand by assistance Dynamic Standing Balance Dynamic Standing - Balance Support: During functional activity;Right upper extremity supported Dynamic Standing - Level of Assistance: 4: Min assist Extremity/Trunk Assessment RUE Assessment RUE Assessment: Exceptions to [facility] Active Range of Motion (AROM) Comments: shoulder flexion approximately 0-110 degrees, all other joints AROM WFLs General Strength Comments: Strength 4/5 overall, slower movement patterns noted as well LUE Assessment LUE Assessment: Exceptions to [facility] Active Range of Motion (AROM) Comments: shoulder flexon approximately 110 degrees General Strength Comments: Strength 4/5 overall, slower movement patterns noted as well   Fareedah Mahler,Darin  OTR/L 11/12/2019, 5:22 PM

## 2019-11-12 NOTE — Progress Notes (Signed)
Physical Therapy Discharge Summary  Patient Details  Name: [name] [name] MRN: [id number] Date of Birth: 10/12/1945  Today's Date: 11/12/2019 PT Individual Time:[id number] PT Individual Time Calculation (min): 52mn and 654m  Patient has met 8 of 11 long term goals due to improved activity tolerance, improved balance, improved postural control, increased strength, ability to compensate for deficits and improved awareness.  Patient to discharge at an ambulatory level CGA.   Patient's care partner attended hand's on family education and is independent to provide the necessary physical and cognitive assistance at discharge.  Reasons goals not met: Pt requiring CGA for safety with functional transfers and not completing 12 steps as he will not be required to go upstairs in home.  Recommendation:  Patient will benefit from ongoing skilled PT services in home health setting to continue to advance safe functional mobility, address ongoing impairments in sitting/standing balance, activity tolerance, LE strength, ambulation, and minimize fall risk.  Equipment: manual WC  Reasons for discharge: treatment goals met and discharge from hospital  Patient/family agrees with progress made and goals achieved: Yes  Skilled Therapeutic Interventions/Progress Updates:   Session 1: Pt received seated in WC and agreeable to therapy. No report of pain. WC transport to gym for energy conservation. Pt performs HEP for functional strengthening of BLEs, including x15 reps standing heel and toe raises, holding onto RW for support. Pt requires minA from PT for safe balance. Pt performs sit<>supine on mat with supervision with cues for sequencing. In supine hooklying pt performs x15 bridges with PT providing tactile cues at bilateral ASIS. Sit>stand with supervision and pt ambulates 175' back to room with CGA. Pt left seated in WC with all needs within reach and alarm intact.   Session 2: Family ed. Pt received  supine in bed and agreeable to therapy. No report of pain. Pt's wife present during session. PT educates on manual WC management and wife demonstrates understanding. PT educates on current level of function of pt and safe pt handling strategies for wife upon discharge, including positioning on L side during transfers and ambulation, and utilizing RW and gait belt at all times when OOB. Extensive education on energy conservation and ensuring pt takes frequent rest breaks as he fatigues quickly and mobility deteriorates with fatigue.  Pt ambulates x80' with RW and PT providing CGA, demonstrating hand placement and cueing for pt's wife. Pt's wife then switches with PT and provides CGA as pt ambulate additional 150'. Pt performs car transfer with with PT and then wife, requiring CGA and increased verbal cuing for safety and sequencing. Pt and wife ambulate up/down ramp x10', performs x2 4" steps with RW, and x4 6" steps with BHR. PT provides WC transport back to room and provides pt with HEP handount and addresses remaining family concerns for safe discharge recommendations.   PT Discharge Precautions/Restrictions Precautions Precautions: Fall Restrictions Weight Bearing Restrictions: No Pain Pain Assessment Pain Scale: 0-10 Pain Score: 0-No pain Vision/Perception  Perception Perception: Within Functional Limits Praxis Praxis: Impaired Praxis Impairment Details: Motor planning;Initiation Praxis-Other Comments: moderately impaired, slightly improved  Cognition Overall Cognitive Status: Impaired/Different from baseline Arousal/Alertness: Awake/alert Orientation Level: Oriented X4 Attention: Sustained Sustained Attention: Impaired Sustained Attention Impairment: Functional basic;Verbal basic Memory: Impaired Memory Impairment: Retrieval deficit;Decreased recall of new information;Decreased short term memory Decreased Short Term Memory: Verbal basic;Functional basic Awareness:  Impaired Awareness Impairment: Emergent impairment Problem Solving: Impaired Problem Solving Impairment: Functional basic Executive Function: Initiating Initiating: Impaired Initiating Impairment: Verbal basic;Functional basic Safety/Judgment: Appears intact  Comments: delayed processing Sensation Sensation Light Touch: Appears Intact Hot/Cold: Not tested Proprioception: Appears Intact Stereognosis: Not tested Coordination Gross Motor Movements are Fluid and Coordinated: No Fine Motor Movements are Fluid and Coordinated: No Coordination and Movement Description: Varies depending on fatigue level with posterior and L lateral lean more pronounced with faituge. Motor  Motor Motor: Motor apraxia;Abnormal postural alignment and control Motor - Discharge Observations: improved activity tolerance, fair mid-line orientation, delayed initiation and sequencing with functional tasks, impaired but improved balance  Mobility Bed Mobility Bed Mobility: Rolling Right;Rolling Left;Supine to Sit;Sit to Supine Rolling Right: Supervision/verbal cueing Rolling Left: Supervision/Verbal cueing Supine to Sit: Supervision/Verbal cueing Sit to Supine: Supervision/Verbal cueing Transfers Transfers: Sit to Stand;Stand to Sit;Stand Pivot Transfers Sit to Stand: Supervision/Verbal cueing Stand to Sit: Supervision/Verbal cueing Stand Pivot Transfers: Contact Guard/Touching assist Stand Pivot Transfer Details: Tactile cues for sequencing;Verbal cues for technique;Verbal cues for sequencing;Visual cues for safe use of DME/AE Transfer (Assistive device): Rolling walker Locomotion  Gait Ambulation: Yes Gait Assistance: Contact Guard/Touching assist Gait Distance (Feet): 175 Feet Assistive device: Rolling walker Gait Assistance Details: Verbal cues for gait pattern;Verbal cues for technique Gait Gait: Yes Gait Pattern: Decreased step length - left;Decreased step length - right;Narrow base of support;Left  flexed knee in stance;Right flexed knee in stance Gait velocity: decreased  Trunk/Postural Assessment  Cervical Assessment Cervical Assessment: Within Functional Limits Thoracic Assessment Thoracic Assessment: Within Functional Limits Lumbar Assessment Lumbar Assessment: Within Functional Limits Postural Control Trunk Control: posterior lean in sitting, posterior and mild L lean in standing Righting Reactions: delayed  Balance Balance Balance Assessed: Yes Static Sitting Balance Static Sitting - Level of Assistance: 7: Independent Dynamic Sitting Balance Dynamic Sitting - Level of Assistance: 5: Stand by assistance Static Standing Balance Static Standing - Level of Assistance: 5: Stand by assistance Dynamic Standing Balance Dynamic Standing - Level of Assistance: 4: Min assist Extremity Assessment   RLE Assessment RLE Assessment: Exceptions to [facility] Active Range of Motion (AROM) Comments: WFL for all functional mobility General Strength Comments: Grossly 4/5. Hip strength 4-/5. LLE Assessment LLE Assessment: Exceptions to [facility] Active Range of Motion (AROM) Comments: WFL for all functional mobility General Strength Comments: Grossly 4/5. Hip strength 4-/5    [name], PT, DPT 11/12/2019, 7:57 AM

## 2019-11-12 NOTE — Progress Notes (Addendum)
Team Conference Report to Patient/Family  Team Conference discussion was reviewed with the patient, including goals, any changes in plan of care and target discharge date.  Patient expressed understanding and is in agreement.  The patient has a target discharge date of 11/13/19. OP services set up , given directions to OP facility and recommended equipment delivered to the room. Wife expressed concern about going OP too soon and requested [place] initially and [place] referral if needed later.  [name] B 11/12/2019, 12:12 PM

## 2019-11-12 NOTE — Progress Notes (Signed)
[facility] PHYSICAL MEDICINE & REHABILITATION PROGRESS NOTE   Subjective/Complaints:  Patient without new complaints.  Slept well overnight.  No new nursing issues.  Family training going well per therapy staff.  ROS-  Pt denies SOB, abd pain, CP, N/V/C/D, and vision changes  Objective:   No results found. No results for input(s): WBC, HGB, HCT, PLT in the last 72 hours. No results for input(s): NA, K, CL, CO2, GLUCOSE, BUN, CREATININE, CALCIUM in the last 72 hours.  Intake/Output Summary (Last 24 hours) at 11/12/2019 1228 Last data filed at 11/12/2019 0924 Gross per 24 hour  Intake 370 ml  Output 375 ml  Net -5 ml     Physical Exam: Vital Signs Blood pressure 135/70, pulse (!) 53, temperature 98.1 F (36.7 C), temperature source Oral, resp. rate 18, height 5\' 10"  (1.778 m), weight 81.7 kg, SpO2 99 %. General: No acute distress.   General: No acute distress Mood and affect are appropriate Heart: Regular rate and rhythm no rubs murmurs or extra sounds Lungs: Clear to auscultation, breathing unlabored, no rales or wheezes Abdomen: Positive bowel sounds, soft nontender to palpation, nondistended Extremities: No clubbing, cyanosis, or edema Skin: No evidence of breakdown, no evidence of rash Neurologic: Cranial nerves II through XII intact, motor strength is 5/5 in bilateral deltoid, bicep, tricep, grip, hip flexor, knee extensors, ankle dorsiflexor and plantar flexor  Musculoskeletal: Full range of motion in all 4 extremities. No joint swelling  Assessment/Plan: 1. 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  Physiatrist is providing close team supervision and 24 hour management of active medical problems listed below.  Physiatrist and rehab team continue  to assess barriers to discharge/monitor patient progress toward functional and medical goals  Care Tool:  Bathing    Body parts bathed by patient: Right arm, Left arm, Chest, Abdomen, Front perineal area, Right upper leg, Left upper leg, Face, Right lower leg, Left lower leg, Buttocks   Body parts bathed by helper: Buttocks     Bathing assist Assist Level: Contact Guard/Touching assist     Upper Body Dressing/Undressing Upper body dressing   What is the patient wearing?: Pull over shirt    Upper body assist Assist Level: Supervision/Verbal cueing    Lower Body Dressing/Undressing Lower body dressing      What is the patient wearing?: Pants, Incontinence brief     Lower body assist Assist for lower body dressing: Minimal Assistance - Patient > 75%     Toileting Toileting    Toileting assist Assist for toileting: Minimal Assistance - Patient > 75% Assistive Device Comment: Urinal   Transfers Chair/bed transfer  Transfers assist     Chair/bed transfer assist level: Minimal Assistance - Patient > 75% Chair/bed transfer assistive device: Programmer, multimedia   Ambulation assist      Assist level: Minimal Assistance - Patient > 75% Assistive device: Walker-rolling Max distance: 12'   Walk 10 feet activity   Assist  Walk 10 feet activity did not occur: Safety/medical concerns(decreased balance/activity tolerance)  Assist level: Contact Guard/Touching assist Assistive device: Walker-rolling   Walk 50 feet activity   Assist Walk 50 feet with 2 turns activity did not occur: Safety/medical concerns(decreased balance/activity tolerance)  Assist level: Minimal Assistance - Patient > 75% Assistive device: Walker-rolling    Walk 150 feet  activity   Assist Walk 150 feet activity did not occur: Safety/medical concerns(decreased balance/activity tolerance)  Assist level: Moderate Assistance - Patient - 50 - 74% Assistive device:  Walker-rolling    Walk 10 feet on uneven surface  activity   Assist Walk 10 feet on uneven surfaces activity did not occur: Safety/medical concerns(decreased balance/activity tolerance)         Wheelchair     Assist   Type of Wheelchair: Manual    Wheelchair assist level: Supervision/Verbal cueing Max wheelchair distance: 10ft    Wheelchair 50 feet with 2 turns activity    Assist        Assist Level: Supervision/Verbal cueing   Wheelchair 150 feet activity     Assist      Assist Level: Maximal Assistance - Patient 25 - 49%   Blood pressure 135/70, pulse (!) 53, temperature 98.1 F (36.7 C), temperature source Oral, resp. rate 18, height 5\' 10"  (1.778 m), weight 81.7 kg, SpO2 99 %.  Medical Problem List and Plan: 1.  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  Antithrombotics: -DVT/anticoagulation/pulmonary emboli: Eliquis             -antiplatelet therapy: N/A 3. Pain Management: Neurontin 300 mg nightly, Lidoderm patch. Well controlled.  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 Mood: Provide emotional support             -antipsychotic agents: N/A 5. Neuropsych: This patient is capable of making decisions on his own behalf. 6. Skin/Wound Care: Routine skin checks 7. Fluids/Electrolytes/Nutrition: Routine in and outs with follow-up chemistries 8.  Acute blood loss anemia.  Hgb stable on 3/7 9.  Hypertension.  Norvasc 10 mg daily, Coreg 25 mg twice daily, hydralazine as directed. Well controlled.  Vitals:   11/11/19 1919 11/12/19 0550  BP: 108/66 135/70  Pulse: 65 (!) 53  Resp: 18 18  Temp: 98.1 F (36.7 C) 98.1 F (36.7 C)  SpO2: 99%  99%  controlled  3/25  Asymptomatic brady- continues to be brady.   10.  BPH.  Flomax 11.  Diabetes mellitus.  Hemoglobin A1c 6.7.  SSI.  Patient on Glucophage 500 mg twice daily prior to admission.  Resume as needed CBG (last 3)  Recent Labs    11/11/19 2104 11/12/19 0548 11/12/19 1150  GLUCAP 105* 98 128*  controlled 3/25  3/14- good control- con't meds 12.  Hyperlipidemia.  Lipitor 13. Constipation:   3/13 Type 6 BM , increase senna to 2 po BID  Used Mg Citrate at home  order 1 btl   Added prune juice with meals.  14. Pleural effusions:stable sized  15. Disposition: Lives with wife who is at home and will be able to help him. Has 3 daughters whom he is very close with who also live nearby.      LOS: 19 days A FACE TO FACE EVALUATION WAS PERFORMED  Charlett Blake 11/12/2019, 12:28 PM

## 2019-11-12 NOTE — Progress Notes (Signed)
Speech Language Pathology Discharge Summary  Patient Details  Name: [name] [name] MRN: [id number] Date of Birth: 05/04/1946  Today's Date: 11/12/2019 SLP Individual Time: 1404-1430 SLP Individual Time Calculation (min): 26 min   Skilled Therapeutic Interventions:  Pt was seen for skilled ST targeting education with pt and his wife (primary caregiver). SLP facilitated session with functional conversation regarding goals and progress in ST this admission. Reviewed impact of pt's delayed processing on functional tasks and comprehension of complex messages, as well as strategies to optimize his comprehension of such messages. Handout and verbal review of compensatory memory strategies provided with emphasis on external aids, not taking, lists, routines, no multitasking. SLP reinforced recommendation for 24/7 supervision as well as full assist with complex cognitive tasks such as medication and finance management, as well as cooking and complex cleaning. SLP also helped pt and his wife identify tasks safe to engage in and with appropriate level of cognitive stimulation such as laundry and other sorting tasks, rinsing and/or drying dishes, light dusting, reading, etc. Pt and his wife acknowledge recommendation for 24/7 supervision and wife feels confident she can provide appropriate level of care. Pt left sitting in wheelchair with needs met and wife still present. Continue per current plan of care.         Patient has met 4 of 4 long term goals.  Patient to discharge at overall Mod;Min level.  Reasons goals not met: n/a   Clinical Impression/Discharge Summary:   Pt made slow gains, but met 4 out of 4 long term goals this admission. Pt currently requires Mod assist for basic cognitive tasks due to delayed processing and impairments impacting his basic problem solving, emergent and anticipatory awareness, sustained attention, and short term memory. Pt will required 24/7 supervision. Pt has demonstrated  improved sustained attention, use of compensatory memory strategies, and error awareness. However, given significant cognitive deficits still present and impacting his daily functioning, recommend pt continue to receive skilled ST services upon discharge. Pt and family education is complete at this time.    Care Partner:  Caregiver Able to Provide Assistance: Yes  Type of Caregiver Assistance: Cognitive  Recommendation:  Home Health SLP;24 hour supervision/assistance  Rationale for SLP Follow Up: Maximize cognitive function and independence;Reduce caregiver burden   Equipment: none   Reasons for discharge: Discharged from hospital   Patient/Family Agrees with Progress Made and Goals Achieved: Yes    Arbutus Leas 11/12/2019, 7:17 AM

## 2019-11-13 LAB — GLUCOSE, CAPILLARY
Glucose-Capillary: 102 mg/dL — ABNORMAL HIGH (ref 70–99)
Glucose-Capillary: 146 mg/dL — ABNORMAL HIGH (ref 70–99)

## 2019-11-13 NOTE — Care Management (Signed)
   The overall goal for the admission was met for:   Discharge location: Home with wife  Length of Stay: 20 days with discharge 11/13/19  Discharge activity level: .  Patient to discharge at overall Supervision level.  Home/community participation: Limited participation  Services provided included: MD, RD, PT, OT, SLP, RN, CM, TR, Pharmacy, Neuropsych and SW  Financial Services: Medicare  Follow-up services arranged: Home Health: PT, OT, SLP, DME: W/C, DA B[facility], TTB and Patient/Family has no preference for HH/DME agencies  Comments (or additional information): [facility] [phone]  Patient/Family verbalized understanding of follow-up arrangements: Yes  Individual responsible for coordination of the follow-up plan: Wife [name]:[phone]  Confirmed correct DME delivered: Margarito Liner 11/13/2019    Margarito Liner

## 2019-11-13 NOTE — Plan of Care (Signed)
Pt to discharge to home; equipment received.

## 2019-11-17 ENCOUNTER — Telehealth: Payer: Self-pay

## 2019-11-17 NOTE — Telephone Encounter (Signed)
Transitional Care call--[name]-wife    1. Are you/is patient experiencing any problems since coming home? No Are there any questions regarding any aspect of care? No 2. Are there any questions regarding medications administration/dosing? No Are meds being taken as prescribed? Yes Patient should review meds with caller to confirm 3. Have there been any falls? No 4. Has Home Health been to the house and/or have they contacted you? Yes If not, have you tried to contact them? Can we help you contact them? 5. Are bowels and bladder emptying properly? Yes Are there any unexpected incontinence issues? No If applicable, is patient following bowel/bladder programs? 6. Any fevers, problems with breathing, unexpected pain? No 7. Are there any skin problems or new areas of breakdown? No 8. Has the patient/family member arranged specialty MD follow up (ie cardiology/neurology/renal/surgical/etc)? Yes Can we help arrange? 9. Does the patient need any other services or support that we can help arrange? No 10. Are caregivers following through as expected in assisting the patient? Yes 11. Has the patient quit smoking, drinking alcohol, or using drugs as recommended? Yes  Appointment time 10:40 am, arrive time 10:20 am and with [address]

## 2019-11-24 ENCOUNTER — Encounter: Payer: Self-pay | Admitting: Registered Nurse

## 2019-11-24 ENCOUNTER — Other Ambulatory Visit: Payer: Self-pay

## 2019-11-24 ENCOUNTER — Encounter: Payer: Medicare Other | Attending: Registered Nurse | Admitting: Registered Nurse

## 2019-11-24 VITALS — BP 131/70 | HR 60 | Temp 98.7°F | Ht 71.0 in | Wt 177.0 lb

## 2019-11-24 DIAGNOSIS — Z8673 Personal history of transient ischemic attack (TIA), and cerebral infarction without residual deficits: Secondary | ICD-10-CM

## 2019-11-24 DIAGNOSIS — Z8679 Personal history of other diseases of the circulatory system: Secondary | ICD-10-CM | POA: Diagnosis present

## 2019-11-24 DIAGNOSIS — Z9889 Other specified postprocedural states: Secondary | ICD-10-CM | POA: Diagnosis present

## 2019-11-24 DIAGNOSIS — I7103 Dissection of thoracoabdominal aorta: Secondary | ICD-10-CM | POA: Diagnosis present

## 2019-11-24 DIAGNOSIS — E7849 Other hyperlipidemia: Secondary | ICD-10-CM | POA: Diagnosis present

## 2019-11-24 DIAGNOSIS — I10 Essential (primary) hypertension: Secondary | ICD-10-CM | POA: Diagnosis present

## 2019-11-24 NOTE — Progress Notes (Signed)
Subjective:    Patient ID: [name] [name], male    DOB: [birth year], 74 y.o.   MRN: [id number]  HPI: [name] [name] is a 74 y.o. male who is here for Transitional care visit in follow up of his CVA, essential hypertension, hyperlipidemia, dissection of thoracoabdominal aorta and history of repair of dissecting thoracic aneurysm.  [name] [name] presented to [facility] on 10/08/2019 with lower chest and back pain over several weeks.  CT Angio Chest/ Abd/ Pel IMPRESSION: 1. 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 2. 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 There is a small amount of periaortic hematoma along the distal descending thoracic aorta. Vascular surgery consult is advised. 3. Small pulmonary embolus involving the central right upper lobe pulmonary artery as well as probable additional small subsegmental embolus in the right lower lobe. 4. Ovoid soft tissue density posterior to the left common iliac vein, indeterminate, possibly an enlarged lymph node or a neurogenic tumor. This can be better evaluated with MRI on a nonemergent basis. 5. Additional findings as above.  He underwent THORACIC AORTIC ENDOVASCULAR STENT GRAFT, by Dr Donzetta Matters.  Neurology was consulted for altered mental status.  CT Head WO Contrast IMPRESSION: Subtle area of gray-white differentiation loss in the posterior right occipital lobe extending from the white matter to cortex, not associated with mass effect. Subtle hypodensity also in the right cerebellum, inferiorly in the inferior cerebellar peduncle. Findings are suspicious for acute to subacute infarct, in particular the area in the right occipital lobe.  Signs of generalized atrophy and chronic white matter disease.  MRI Brain:  IMPRESSION: 1. Small acute bilateral cerebral and cerebellar infarcts  consistent with emboli. 2. Moderate chronic small vessel ischemic disease.  [name] [name] was admitted to inpatient rehabilitation on 10/24/2019 and Discharged home on 11/13/2019. He is receiving home Therapy with [facility].   Wife in room, all questions answered.    Pain Inventory Average Pain 0 Pain Right Now 0 My pain is no pain  In the last 24 hours, has pain interfered with the following? General activity 0 Relation with others 0 Enjoyment of life 0 What TIME of day is your pain at its worst? no pain Sleep (in general) NA  Pain is worse with: walking and standing Pain improves with: therapy/exercise Relief from Meds: no pain  Mobility walk with assistance use a cane use a wheelchair needs help with transfers  Function retired I need assistance with the following:  meal prep, household duties and shopping  Neuro/Psych weakness trouble walking confusion depression anxiety  Prior Studies Any changes since last visit?  no  Physicians involved in your care Any changes since last visit?  no Saw diabetic/pcp MD yesterday No family history on file. Social History   Socioeconomic History  . Marital status: Married    Spouse name: Not on file  . Number of children: Not on file  . Years of education: Not on file  . Highest education level: Not on file  Occupational History  . Not on file  Tobacco Use  . Smoking status: Former Smoker    Packs/day: 0.25    Years: 40.00    Pack years: 10.00    Types: Cigarettes  . Smokeless tobacco: Never Used  . Tobacco comment: none since 06/24/13  Substance and Sexual Activity  . Alcohol use: No  . Drug  use: No  . Sexual activity: Not on file  Other Topics Concern  . Not on file  Social History Narrative  . Not on file   Social Determinants of Health   Financial Resource Strain:   . Difficulty of Paying Living Expenses:   Food Insecurity:   . Worried About Charity fundraiser in the Last Year:   .  Arboriculturist in the Last Year:   Transportation Needs:   . Film/video editor (Medical):   Marland Kitchen Lack of Transportation (Non-Medical):   Physical Activity:   . Days of Exercise per Week:   . Minutes of Exercise per Session:   Stress:   . Feeling of Stress :   Social Connections:   . Frequency of Communication with Friends and Family:   . Frequency of Social Gatherings with Friends and Family:   . Attends Religious Services:   . Active Member of Clubs or Organizations:   . Attends Archivist Meetings:   Marland Kitchen Marital Status:    Past Surgical History:  Procedure Laterality Date  . ANTERIOR LAT LUMBAR FUSION Left 12/15/2014   Procedure: ANTERIOR LATERAL LUMBAR FUSION 1 LEVEL;  Surgeon: Phylliss Bob, MD;  Location: [facility];  Service: Orthopedics;  Laterality: Left;  Left sided lateral lumbar interbody fusion, lumbar 3-4, posterior spinal fusion, lumbar 3-4 with instrumentation.  . COLONOSCOPY    . ESOPHAGOGASTRODUODENOSCOPY    . fatty tissue removed from stomach    . LUMBAR LAMINECTOMY/DECOMPRESSION MICRODISCECTOMY N/A 07/08/2013   Procedure: LUMBAR LAMINECTOMY/DECOMPRESSION MICRODISCECTOMY;  Surgeon: Sinclair Ship, MD;  Location: [facility];  Service: Orthopedics;  Laterality: N/A;  Lumbar 3-4, lumbar 4-5 decompression  . RADIOLOGY WITH ANESTHESIA N/A 10/22/2019   Procedure: MRI WITH ANESTHESIA OF LUMBAR SPINE WITHOUT CONTRAST;  Surgeon: Radiologist, Medication, MD;  Location: [facility];  Service: Radiology;  Laterality: N/A;  . right ankle surgery     as child   . THORACIC AORTIC ENDOVASCULAR STENT GRAFT N/A 10/15/2019   Procedure: THORACIC AORTIC ENDOVASCULAR STENT GRAFT;  Surgeon: Waynetta Sandy, MD;  Location: [facility];  Service: Vascular;  Laterality: N/A;  . TONSILLECTOMY     as a child   Past Medical History:  Diagnosis Date  . Chronic back pain    stenosis  . Enlarged prostate   . GERD (gastroesophageal reflux disease)    occasionally will take a zantac(maybe  once a month)  . History of colon polyps   . History of kidney stones   . History of stress test    done 10 yrs. ago, as a baseline   . Hyperlipidemia    takes Crestor daily  . Hypertension    takes Amlodipine daily and Lotensin as well   BP 131/70   Pulse 60   Temp 98.7 F (37.1 C)   Ht 5\' 11"  (1.803 m)   Wt 177 lb (80.3 kg) Comment: reported  SpO2 95%   BMI 24.69 kg/m   Opioid Risk Score:   Fall Risk Score:  `1  Depression screen PHQ 2/9  Depression screen PHQ 2/9 11/24/2019  Decreased Interest 2  Down, Depressed, Hopeless 1  PHQ - 2 Score 3  Altered sleeping 0  Tired, decreased energy 1  Change in appetite 0  Feeling bad or failure about yourself  1  Trouble concentrating 2  Moving slowly or fidgety/restless 1  Suicidal thoughts 0  PHQ-9 Score 8    Review of Systems  Constitutional: Negative.   HENT: Negative.  Eyes: Negative.   Respiratory: Negative.   Cardiovascular: Negative.   Gastrointestinal: Positive for constipation.  Endocrine:       High BS  Genitourinary: Negative.   Musculoskeletal: Positive for gait problem.  Skin: Negative.   Allergic/Immunologic: Negative.   Neurological: Positive for weakness.  Hematological: Negative.   Psychiatric/Behavioral: Positive for confusion and dysphoric mood. The patient is nervous/anxious.   All other systems reviewed and are negative.      Objective:   Physical Exam Vitals and nursing note reviewed.  Constitutional:      Appearance: Normal appearance.  Cardiovascular:     Rate and Rhythm: Normal rate and regular rhythm.     Pulses: Normal pulses.     Heart sounds: Normal heart sounds.  Pulmonary:     Effort: Pulmonary effort is normal.     Breath sounds: Normal breath sounds.  Musculoskeletal:     Cervical back: Normal range of motion and neck supple.     Comments: Normal Muscle Bulk and Muscle Testing Reveals:  Upper Extremities: Full ROM and Muscle Strength 5/5  Lower Extremities: Full ROM and  Muscle Strength 5/5 Arrived in wheelchair   Skin:    General: Skin is warm and dry.  Neurological:     Mental Status: He is alert and oriented to [place], place, and time.  Psychiatric:        Mood and Affect: Mood normal.        Behavior: Behavior normal.           Assessment & Plan:  1. CVA: Continue with Home Therapy with [facility]. He has a HFU scheduled with Neurology. Continue to Monitor.  2. Essential hypertension: Continue current medication regimen. PCP Following.   3.Hyperlipidemia: Continue current medication regimen. PCP Following.  4. Dissection of thoracoabdominal aorta and history of repair of dissecting thoracic aneurysm. S/P THORACIC AORTIC ENDOVASCULAR STENT GRAFT, by Dr Donzetta Matters. Vascular Following.   F/U with Dr Letta Pate in 4- 6 weeks

## 2019-12-04 ENCOUNTER — Other Ambulatory Visit: Payer: Self-pay

## 2019-12-04 ENCOUNTER — Encounter: Payer: Self-pay | Admitting: Vascular Surgery

## 2019-12-04 ENCOUNTER — Ambulatory Visit (INDEPENDENT_AMBULATORY_CARE_PROVIDER_SITE_OTHER): Payer: Self-pay | Admitting: Vascular Surgery

## 2019-12-04 VITALS — BP 108/66 | HR 64 | Temp 98.0°F | Resp 20 | Ht 71.0 in | Wt 181.0 lb

## 2019-12-04 DIAGNOSIS — I7101 Dissection of thoracic aorta: Secondary | ICD-10-CM

## 2019-12-04 DIAGNOSIS — I71019 Dissection of thoracic aorta, unspecified: Secondary | ICD-10-CM

## 2019-12-04 NOTE — Progress Notes (Signed)
    Subjective:     Patient ID: [name] [name], male   DOB: May 28, 1946, 74 y.o.   MRN: [id number]  HPI 74 year old male status post thoracic endograft doing for type B aortic dissection with aneurysmal degeneration.  This was done percutaneously.  He is now doing well he is at a rehab since March 26.  No studies performed prior to today's visit.  He is walking with help of walker.  He denies new back or abdominal pain.   Review of Systems Weakness    Objective:   Physical Exam Vitals:   12/04/19 1454  BP: 108/66  Pulse: 64  Resp: 20  Temp: 98 F (36.7 C)  SpO2: 96%   Awake alert oriented Nonlabored respirations Abdomen is soft Bilateral pedal pulses are palpable    Assessment/plan     74 year old male status post above-noted procedure.  No new studies were performed prior to today's visit.  He will need a CT scan chest abdomen pelvis we will get this and have him follow-up in 4 to 6 weeks.  This could be done as a phone visit to review his CT scan.    Adysson Revelle C. Donzetta Matters, MD [facility] Office: [phone] Pager: [phone]

## 2019-12-09 ENCOUNTER — Other Ambulatory Visit: Payer: Self-pay

## 2019-12-09 DIAGNOSIS — I7101 Dissection of thoracic aorta: Secondary | ICD-10-CM

## 2019-12-09 DIAGNOSIS — I71019 Dissection of thoracic aorta, unspecified: Secondary | ICD-10-CM

## 2020-01-07 ENCOUNTER — Ambulatory Visit: Payer: Medicare Other | Admitting: Neurology

## 2020-01-07 ENCOUNTER — Encounter: Payer: Self-pay | Admitting: Physical Medicine & Rehabilitation

## 2020-01-07 ENCOUNTER — Other Ambulatory Visit: Payer: Self-pay

## 2020-01-07 ENCOUNTER — Encounter: Payer: Medicare Other | Attending: Registered Nurse | Admitting: Physical Medicine & Rehabilitation

## 2020-01-07 ENCOUNTER — Encounter: Payer: Self-pay | Admitting: Neurology

## 2020-01-07 VITALS — BP 159/87 | HR 56 | Temp 97.3°F | Ht 71.0 in | Wt 187.2 lb

## 2020-01-07 VITALS — BP 124/74 | HR 61 | Ht 71.0 in | Wt 188.0 lb

## 2020-01-07 DIAGNOSIS — I10 Essential (primary) hypertension: Secondary | ICD-10-CM | POA: Diagnosis present

## 2020-01-07 DIAGNOSIS — I7103 Dissection of thoracoabdominal aorta: Secondary | ICD-10-CM | POA: Insufficient documentation

## 2020-01-07 DIAGNOSIS — R269 Unspecified abnormalities of gait and mobility: Secondary | ICD-10-CM

## 2020-01-07 DIAGNOSIS — E7849 Other hyperlipidemia: Secondary | ICD-10-CM | POA: Diagnosis present

## 2020-01-07 DIAGNOSIS — I749 Embolism and thrombosis of unspecified artery: Secondary | ICD-10-CM

## 2020-01-07 DIAGNOSIS — Z9889 Other specified postprocedural states: Secondary | ICD-10-CM | POA: Diagnosis present

## 2020-01-07 DIAGNOSIS — Z8679 Personal history of other diseases of the circulatory system: Secondary | ICD-10-CM | POA: Diagnosis present

## 2020-01-07 DIAGNOSIS — Z8673 Personal history of transient ischemic attack (TIA), and cerebral infarction without residual deficits: Secondary | ICD-10-CM | POA: Insufficient documentation

## 2020-01-07 DIAGNOSIS — G3184 Mild cognitive impairment, so stated: Secondary | ICD-10-CM | POA: Diagnosis not present

## 2020-01-07 NOTE — Patient Instructions (Signed)
Please use walker rather than WC at home

## 2020-01-07 NOTE — Progress Notes (Signed)
[facility] [address] [place]. [place] [address] [phone]       OFFICE FOLLOW-UP NOTE  [name] Date of Birth:  June 24, 1946 Medical Record Number:  [id number]   HPI: [name] [name] is a 74 year old pleasant African-American male seen today for initial office follow-up visit following hospital consultation for stroke in February 2021.  Is accompanied by his wife.  History is obtained from them, review of electronic medical records and I personally reviewed imaging films in PACS.  He has past medical history of hyperlipidemia, hypertension, presented to [facility] with back pain and epigastric pain on 10/08/2019 and was found to have distal thoracic aortic aneurysm and small bilateral pulmonary embolisms.  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  He developed altered mental status and weakness in 10/17/2019 and CT scan showed hypodensities in right cerebellum and right occipital lobe suspicious for infarcts.  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  MRA of the head showed chronic moderate left P1 stenosis.  Carotid Dopplers were unremarkable.  2D echo showed normal ejection fraction.  LDL cholesterol was 72 mg percent hemoglobin A1c was 6.7.  Patient was initially started on IV heparin and was planned for being discharged on Eliquis and went to inpatient rehab.  However his medication list today states that he is on aspirin 81 mg only and patient is unable to tell me the reason for this.  He is currently getting home physical and occupational therapy which she has just finished but outpatient therapy has not yet started.  He walks with a wheeled walker but feels his balance is off and tends to fall backwards and has poor truncal balance.  He can walk with a walker short distances well.  He has had no falls or injuries.  He just had follow-up visit with Dr. Letta Pate today.  He does complain of mild memory  difficulties and cognitive impairment since his stroke.  This is not progressive. ROS:   14 system review of systems is positive for gait imbalance, walking difficulty and all other systems negative PMH:  Past Medical History:  Diagnosis Date  . Chronic back pain    stenosis  . Enlarged prostate   . GERD (gastroesophageal reflux disease)    occasionally will take a zantac(maybe once a month)  . History of colon polyps   . History of kidney stones   . History of stress test    done 10 yrs. ago, as a baseline   . Hyperlipidemia    takes Crestor daily  . Hypertension    takes Amlodipine daily and Lotensin as well  . Stroke [facility])     Social History:  Social History   Socioeconomic History  . Marital status: Married    Spouse name: Not on file  . Number of children: Not on file  . Years of education: Not on file  . Highest education level: Not on file  Occupational History  . Not on file  Tobacco Use  . Smoking status: Former Smoker    Packs/day: 0.25    Years: 40.00    Pack years: 10.00    Types: Cigarettes  . Smokeless tobacco: Never Used  . Tobacco comment: none since 06/24/13  Substance and Sexual Activity  . Alcohol use: No  . Drug use: No  . Sexual activity: Not on file  Other Topics Concern  . Not on file  Social History Narrative  . Not on file  Social Determinants of Health   Financial Resource Strain:   . Difficulty of Paying Living Expenses:   Food Insecurity:   . Worried About Charity fundraiser in the Last Year:   . Arboriculturist in the Last Year:   Transportation Needs:   . Film/video editor (Medical):   Marland Kitchen Lack of Transportation (Non-Medical):   Physical Activity:   . Days of Exercise per Week:   . Minutes of Exercise per Session:   Stress:   . Feeling of Stress :   Social Connections:   . Frequency of Communication with Friends and Family:   . Frequency of Social Gatherings with Friends and Family:   . Attends Religious Services:     . Active Member of Clubs or Organizations:   . Attends Archivist Meetings:   Marland Kitchen Marital Status:   Intimate Partner Violence:   . Fear of Current or Ex-Partner:   . Emotionally Abused:   Marland Kitchen Physically Abused:   . Sexually Abused:     Medications:   Current Outpatient Medications on File Prior to Visit  Medication Sig Dispense Refill  . magnesium citrate SOLN 1 Bottle as needed for severe constipation.    Marland Kitchen acetaminophen (TYLENOL) 325 MG tablet Take 2 tablets (650 mg total) by mouth every 6 (six) hours as needed for moderate pain.    Marland Kitchen amLODipine (NORVASC) 5 MG tablet Take 1 tablet (5 mg total) by mouth daily. 30 tablet 0  . Ascorbic Acid (VITAMIN C) 500 MG CAPS Take 500-1,000 mg by mouth daily.    Marland Kitchen aspirin EC 81 MG tablet Take 81 mg by mouth daily.    Marland Kitchen CALCIUM PO Take 1 tablet by mouth daily.    . carvedilol (COREG) 25 MG tablet Take 1 tablet (25 mg total) by mouth 2 (two) times daily with a meal. 60 tablet 0  . Cyanocobalamin (VITAMIN B-12 PO) Take 1 tablet by mouth daily.    Marland Kitchen gabapentin (NEURONTIN) 600 MG tablet Take 0.5 tablets (300 mg total) by mouth at bedtime. 30 tablet 0  . hydrALAZINE (APRESOLINE) 10 MG tablet Take 1 tablet (10 mg total) by mouth every 6 (six) hours. 180 tablet 0  . pantoprazole (PROTONIX) 40 MG tablet Take 1 tablet (40 mg total) by mouth daily. 30 tablet 0  . rosuvastatin (CRESTOR) 10 MG tablet Take 1 tablet (10 mg total) by mouth daily. 30 tablet 0  . tamsulosin (FLOMAX) 0.4 MG CAPS capsule Take 1 capsule (0.4 mg total) by mouth daily. 30 capsule 0   No current facility-administered medications on file prior to visit.    Allergies:   Allergies  Allergen Reactions  . Lyrica [Pregabalin] Nausea Only and Other (See Comments)    Hallucinations and dizziness, also  . Sulfa Antibiotics Other (See Comments)    Reaction not recalled     Physical Exam General: well developed, well nourished elderly African-American male, seated, in no evident  distress Head: head normocephalic and atraumatic.  Neck: supple with no carotid or supraclavicular bruits Cardiovascular: regular rate and rhythm, no murmurs Musculoskeletal: no deformity Skin:  no rash/petichiae Vascular:  Normal pulses all extremities Vitals:   01/07/20 1513  BP: 124/74  Pulse: 61   Neurologic Exam Mental Status: Awake and fully alert. Oriented to place and time. Recent and remote memory diminished attention span, concentration and fund of knowledge diminished. Mood and affect appropriate.  Recall 0/3.  Able to name only 7 animals which can walk on  4 legs.  Clock drawing 4/4. Cranial Nerves: Fundoscopic exam reveals sharp disc margins. Pupils equal, briskly reactive to light. Extraocular movements full without nystagmus. Visual fields full to confrontation. Hearing intact. Facial sensation intact. Face, tongue, palate moves normally and symmetrically.  Motor: Normal bulk and tone. Normal strength in all tested extremity muscles. Sensory.: intact to touch ,pinprick .position and vibratory sensation.  Coordination: Rapid alternating movements normal in all extremities. Finger-to-nose and heel-to-shin performed accurately bilaterally. Gait and Station: Arises from chair without difficulty. Stance is broad-based gait demonstrates mild ataxia.  Unsteady when standing on either foot unsupported.  Uses a walker for walking.  Poor truncal balance and tends to fall back. reflexes: 1+ and symmetric. Toes downgoing.   NIHSS  0 Modified Rankin  3   ASSESSMENT: 74 year old African-American male with multiple by cerebral infarcts in February 2021 s/p aortic graft surgery for aortic dissection.  He is doing reasonably well with mild memory and cognitive impairment and gait imbalance.     PLAN: I had a long discussion the patient and his wife regarding his multiple small brain infarcts following his aortic aneurysm repair surgery and residual mild memory loss and cognitive  impairment and gait imbalance and answered questions.  I recommend he start outpatient physical occupational therapy for improving his gait and balance and also participate in mentally challenging activities like solving crossword puzzles, playing bridge and sudoku to help with his cognitive impairment.  We also discussed memory compensation strategies.  Continue aspirin for stroke prevention and aggressive risk factor modification.  Return for follow-up in the future in 6 months with my nurse practitioner Janett Billow or call earlier if necessary. Greater than 50% of time during this 25 minute visit was spent on counseling,explanation of diagnosis of mild cognitive impairment and gait abnormality, planning of further management, discussion with patient and family and coordination of care Antony Contras, MD  [facility] [address] [place], [place] [address]  Phone [phone] Fax [phone] Note: This document was prepared with digital dictation and possible smart phrase technology. Any transcriptional errors that result from this process are unintentional

## 2020-01-07 NOTE — Progress Notes (Signed)
Subjective:    Patient ID: [name] [name], male    DOB: [birth year], 74 y.o.   MRN: [id number] 73 y.o. right-handed male with history of chronic back pain with lumbar discectomy 2014 as well as anterior lateral lumbar fusion 2016, hypertension, diabetes mellitus, hyperlipidemia and remote tobacco abuse.  Per chart review lives with spouse independent prior to admission.  Presented 10/08/2019 with lower chest and back pain with epigastric discomfort times several weeks as well as bouts of nausea and vomiting.  In the ED blood pressure 180s/130s.  EKG showed nonspecific ST-T changes not significantly different from prior EKG 2016.  Troponin high-sensitivity 28, potassium 3.3, creatinine 1.25, hemoglobin 14.5, SARS coronavirus negative.  Chest x-ray no active disease.  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  Small pulmonary embolus involving the central right lobe pulmonary artery as well as probable additional small subsegmental embolus in the right lower lobe.  Echocardiogram with ejection fraction 65%.  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 Donzetta Matters.  Placed on intravenous heparin therapy.  Neurology consulted 10/17/2019 for altered mental status.  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  Carotid Dopplers with no ICA stenosis.  Neurology follow-up heparin was transitioned to Eliquis.  Hospital course blood loss anemia 9.3 follow-up GI services there was some fear of possible GI bleed related to intravenous heparin as well as transition to Eliquis no overt bleeding noted hemoglobin hematocrit remained stable no further work-up indicated.  Patient was admitted for a comprehensive rehab program.     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  Pain Inventory Average Pain 8 Pain Right Now 8 My pain is .  In the last 24 hours, has pain interfered with the following? General activity 8 Relation with others 8 Enjoyment of life 5 What TIME of day is your pain at its worst? morning Sleep (in general) Fair  Pain is worse with: walking and standing Pain improves with: therapy/exercise Relief from Meds: na  Mobility use a walker how many minutes can you walk? 15 ability to climb steps?  yes do you drive?  no  Function retired  Neuro/Psych bladder control problems trouble walking confusion  Prior Studies Any changes since last visit?  no  Physicians involved in your care Any changes since last visit?  no   No family history on file. Social History   Socioeconomic History  . Marital status: Married    Spouse name: Not on file  . Number of children: Not on file  . Years of education: Not on file  . Highest education level: Not on file  Occupational History  . Not on file  Tobacco Use  . Smoking status: Former Smoker  Packs/day: 0.25    Years: 40.00    Pack years: 10.00    Types: Cigarettes  . Smokeless tobacco: Never Used  . Tobacco comment: none since 06/24/13  Substance and Sexual Activity  . Alcohol use: No  . Drug use: No  . Sexual activity: Not on file  Other Topics Concern  . Not on file  Social History Narrative  . Not on file   Social Determinants of Health   Financial  Resource Strain:   . Difficulty of Paying Living Expenses:   Food Insecurity:   . Worried About Charity fundraiser in the Last Year:   . Arboriculturist in the Last Year:   Transportation Needs:   . Film/video editor (Medical):   Marland Kitchen Lack of Transportation (Non-Medical):   Physical Activity:   . Days of Exercise per Week:   . Minutes of Exercise per Session:   Stress:   . Feeling of Stress :   Social Connections:   . Frequency of Communication with Friends and Family:   . Frequency of Social Gatherings with Friends and Family:   . Attends Religious Services:   . Active Member of Clubs or Organizations:   . Attends Archivist Meetings:   Marland Kitchen Marital Status:    Past Surgical History:  Procedure Laterality Date  . ANTERIOR LAT LUMBAR FUSION Left 12/15/2014   Procedure: ANTERIOR LATERAL LUMBAR FUSION 1 LEVEL;  Surgeon: Phylliss Bob, MD;  Location: [facility];  Service: Orthopedics;  Laterality: Left;  Left sided lateral lumbar interbody fusion, lumbar 3-4, posterior spinal fusion, lumbar 3-4 with instrumentation.  . COLONOSCOPY    . ESOPHAGOGASTRODUODENOSCOPY    . fatty tissue removed from stomach    . LUMBAR LAMINECTOMY/DECOMPRESSION MICRODISCECTOMY N/A 07/08/2013   Procedure: LUMBAR LAMINECTOMY/DECOMPRESSION MICRODISCECTOMY;  Surgeon: Sinclair Ship, MD;  Location: [facility];  Service: Orthopedics;  Laterality: N/A;  Lumbar 3-4, lumbar 4-5 decompression  . RADIOLOGY WITH ANESTHESIA N/A 10/22/2019   Procedure: MRI WITH ANESTHESIA OF LUMBAR SPINE WITHOUT CONTRAST;  Surgeon: Radiologist, Medication, MD;  Location: [facility];  Service: Radiology;  Laterality: N/A;  . right ankle surgery     as child   . THORACIC AORTIC ENDOVASCULAR STENT GRAFT N/A 10/15/2019   Procedure: THORACIC AORTIC ENDOVASCULAR STENT GRAFT;  Surgeon: Waynetta Sandy, MD;  Location: [facility];  Service: Vascular;  Laterality: N/A;  . TONSILLECTOMY     as a child   Past Medical History:  Diagnosis Date   . Chronic back pain    stenosis  . Enlarged prostate   . GERD (gastroesophageal reflux disease)    occasionally will take a zantac(maybe once a month)  . History of colon polyps   . History of kidney stones   . History of stress test    done 10 yrs. ago, as a baseline   . Hyperlipidemia    takes Crestor daily  . Hypertension    takes Amlodipine daily and Lotensin as well   BP (!) 159/87   Pulse (!) 56   Temp (!) 97.3 F (36.3 C)   Ht 5\' 11"  (1.803 m)   Wt 187 lb 3.2 oz (84.9 kg)   SpO2 97%   BMI 26.11 kg/m   Opioid Risk Score:   Fall Risk Score:  `1  Depression screen PHQ 2/9  Depression screen PHQ 2/9 11/24/2019  Decreased Interest 2  Down, Depressed, Hopeless 1  PHQ - 2 Score 3  Altered sleeping 0  Tired,  decreased energy 1  Change in appetite 0  Feeling bad or failure about yourself  1  Trouble concentrating 2  Moving slowly or fidgety/restless 1  Suicidal thoughts 0  PHQ-9 Score 8    Review of Systems  Musculoskeletal: Positive for gait problem.  Psychiatric/Behavioral: Positive for confusion.  All other systems reviewed and are negative.      Objective:   Physical Exam Vitals and nursing note reviewed.  Constitutional:      Appearance: Normal appearance.  Eyes:     Extraocular Movements: Extraocular movements intact.     Conjunctiva/sclera: Conjunctivae normal.     Pupils: Pupils are equal, round, and reactive to light.  Neurological:     General: No focal deficit present.     Mental Status: He is alert and oriented to [place], place, and time.  Psychiatric:        Mood and Affect: Mood normal.        Behavior: Behavior normal.    A and O x 3  Immediate and short term recall 2/3  Serial sevens to 49 WORLD forward but not backward Motor strength is 5/5 bilateral deltoid bicep tricep grip hip flexion knee extension ankle dorsiflexion. Finger thumb opposition is intact There is no evidence of dysarthria or aphasia.     Assessment & Plan:   #1.  Bilateral small cerebral and cerebellar infarcts with worsening of cognitive deficits.  He has decreased attention concentration as well as recall. He has balance problems but no significant weakness in the lower extremities. Will make a referral to outpatient PT OT speech and neuro rehab I would like to see him back in 2 months We will follow-up with Dr. Leonie Man from neurology Repeat CT angio of the chest and follow-up with vascular surgery in regards to the aortic aneurysm repair

## 2020-01-07 NOTE — Patient Instructions (Signed)
I had a long discussion the patient and his wife regarding his multiple small brain infarcts following his aortic aneurysm repair surgery and residual mild memory loss and cognitive impairment and gait imbalance and answered questions.  I recommend he start outpatient physical occupational therapy for improving his gait and balance and also participate in mentally challenging activities like solving crossword puzzles, playing bridge and sudoku to help with his cognitive impairment.  We also discussed memory compensation strategies.  Continue aspirin for stroke prevention and aggressive risk factor modification.  Return for follow-up in the future in 6 months with my nurse practitioner Janett Billow or call earlier if necessary. Memory Compensation Strategies  1. Use "WARM" strategy.  W= write it down  A= associate it  R= repeat it  M= make a mental note  2.   You can keep a Social worker.  Use a 3-ring notebook with sections for the following: calendar, important names and phone numbers,  medications, doctors' names/phone numbers, lists/reminders, and a section to journal what you did  each day.   3.    Use a calendar to write appointments down.  4.    Write yourself a schedule for the day.  This can be placed on the calendar or in a separate section of the Memory Notebook.  Keeping a  regular schedule can help memory.  5.    Use medication organizer with sections for each day or morning/evening pills.  You may need help loading it  6.    Keep a basket, or pegboard by the door.  Place items that you need to take out with you in the basket or on the pegboard.  You may also want to  include a message board for reminders.  7.    Use sticky notes.  Place sticky notes with reminders in a place where the task is performed.  For example: " turn off the  stove" placed by the stove, "lock the door" placed on the door at eye level, " take your medications" on  the bathroom mirror or by the place where you  normally take your medications.  8.    Use alarms/timers.  Use while cooking to remind yourself to check on food or as a reminder to take your medicine, or as a  reminder to make a call, or as a reminder to perform another task, etc.

## 2020-01-08 ENCOUNTER — Ambulatory Visit
Admission: RE | Admit: 2020-01-08 | Discharge: 2020-01-08 | Disposition: A | Discharge status: Home / Self Care | Payer: Medicare Other | Source: Ambulatory Visit | Attending: Vascular Surgery | Admitting: Vascular Surgery

## 2020-01-08 DIAGNOSIS — I71019 Dissection of thoracic aorta, unspecified: Secondary | ICD-10-CM

## 2020-01-08 MED ORDER — IOPAMIDOL (ISOVUE-370) INJECTION 76%
75.0000 mL | Freq: Once | INTRAVENOUS | Status: AC | PRN
  Administered 2020-01-08: 75 mL via INTRAVENOUS

## 2020-01-15 ENCOUNTER — Encounter: Payer: Self-pay | Admitting: Vascular Surgery

## 2020-01-15 ENCOUNTER — Other Ambulatory Visit: Payer: Self-pay

## 2020-01-15 ENCOUNTER — Ambulatory Visit (INDEPENDENT_AMBULATORY_CARE_PROVIDER_SITE_OTHER): Payer: Medicare Other | Admitting: Vascular Surgery

## 2020-01-15 DIAGNOSIS — I7101 Dissection of thoracic aorta: Secondary | ICD-10-CM | POA: Diagnosis not present

## 2020-01-15 DIAGNOSIS — I71019 Dissection of thoracic aorta, unspecified: Secondary | ICD-10-CM

## 2020-01-15 NOTE — Progress Notes (Addendum)
Virtual Visit via Telephone Note   I connected with [name] [name] on 01/15/2020 using the [employer] by telephone and verified that I was speaking with the correct [name] [name] using two identifiers. Patient was located at home with his wife and I in the office.   The limitations of evaluation and management by telemedicine and the availability of in [place] appointments have been previously discussed with the patient and are documented in the patients chart. The patient expressed understanding and consented to proceed.   Chief Complaint: back pain  History of Present Illness: [name] [name] is a 74 y.o. 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  He underwent thoracic endograft doing sometime during the perioperative period likely secondary to his procedure he had showering embolic stroke.  He has now been recommended for outpatient PT and OT and has been followed up with Dr. Letta [name] and Dr. Leonie [name].  He has now had CT scan to follow-up prior to this phone visit.  His chief complaint is lower back pain and he has been evaluated by [name] [name] and is scheduled to have injections.  Past Medical History:  Diagnosis Date  . Chronic back pain    stenosis  . Enlarged prostate   . GERD (gastroesophageal reflux disease)    occasionally will take a zantac(maybe once a month)  . History of colon polyps   . History of kidney stones   . History of stress test    done 10 yrs. ago, as a baseline   . Hyperlipidemia    takes Crestor daily  . Hypertension    takes Amlodipine daily and Lotensin as well  . Stroke [name] [name])     Past Surgical History:  Procedure Laterality Date  . ANTERIOR LAT LUMBAR FUSION Left 12/15/2014   Procedure: ANTERIOR LATERAL LUMBAR FUSION 1 LEVEL;  Surgeon: [name] Bob, [name];  Location: [place];  Service: Orthopedics;  Laterality: Left;  Left sided lateral lumbar interbody fusion, lumbar 3-4, posterior spinal fusion, lumbar 3-4 with  instrumentation.  . COLONOSCOPY    . ESOPHAGOGASTRODUODENOSCOPY    . fatty tissue removed from stomach    . LUMBAR LAMINECTOMY/DECOMPRESSION MICRODISCECTOMY N/A 07/08/2013   Procedure: LUMBAR LAMINECTOMY/DECOMPRESSION MICRODISCECTOMY;  Surgeon: [name] Ship, [name];  Location: [place];  Service: Orthopedics;  Laterality: N/A;  Lumbar 3-4, lumbar 4-5 decompression  . RADIOLOGY WITH ANESTHESIA N/A 10/22/2019   Procedure: MRI WITH ANESTHESIA OF LUMBAR SPINE WITHOUT CONTRAST;  Surgeon: [name] [name];  Location: [place];  Service: Radiology;  Laterality: N/A;  . right ankle surgery     as child   . THORACIC AORTIC ENDOVASCULAR STENT GRAFT N/A 10/15/2019   Procedure: THORACIC AORTIC ENDOVASCULAR STENT GRAFT;  Surgeon: [name] Sandy, [name];  Location: [place];  Service: Vascular;  Laterality: N/A;  . TONSILLECTOMY     as a child    Current Meds  Medication Sig  . acetaminophen (TYLENOL) 325 MG tablet Take 2 tablets (650 mg total) by mouth every 6 (six) hours as needed for moderate pain.  Marland Kitchen amLODipine (NORVASC) 5 MG tablet Take 1 tablet (5 mg total) by mouth daily.  . Ascorbic Acid (VITAMIN C) 500 MG CAPS Take 500-1,000 mg by mouth daily.  Marland Kitchen aspirin EC 81 MG tablet Take 81 mg by mouth daily.  Marland Kitchen CALCIUM PO Take 1 tablet by mouth daily.  . carvedilol (COREG) 25 MG tablet Take 1 tablet (25 mg total) by mouth 2 (two) times daily  with a meal.  . Cyanocobalamin (VITAMIN B-12 PO) Take 1 tablet by mouth daily.  Marland Kitchen gabapentin (NEURONTIN) 600 MG tablet Take 0.5 tablets (300 mg total) by mouth at bedtime.  . hydrALAZINE (APRESOLINE) 10 MG tablet Take 1 tablet (10 mg total) by mouth every 6 (six) hours.  . magnesium citrate SOLN 1 Bottle as needed for severe constipation.  . metFORMIN (GLUCOPHAGE) 500 MG tablet Take 500 mg by mouth 2 (two) times daily.  . pantoprazole (PROTONIX) 40 MG tablet Take 1 tablet (40 mg total) by mouth daily.  . rosuvastatin (CRESTOR) 10 MG tablet Take 1 tablet  (10 mg total) by mouth daily.  . tamsulosin (FLOMAX) 0.4 MG CAPS capsule Take 1 capsule (0.4 mg total) by mouth daily.    12 system ROS was negative unless otherwise noted in HPI   Observations/Objective: There were no vitals filed for this visit.  He is awake and alert comprehends our discussion well    CTA IMPRESSION: 1. 4.6 cm ascending thoracic aortic aneurysm. 2. Interval thoracic aortic stent graft placement with decrease in thrombosed false lumen in the distal descending thoracic segment. 3. Small proximal endoleak , probably with type 1 and 2 components as above. 4. Stable 4.8 cm infrarenal abdominal aortic aneurysm. 5. SMA dissection involvement, with some improvement in compromise of the true lumen. 6. Coronary calcifications. The severity of coronary artery disease and any potential stenosis cannot be assessed on this non-gated CT examination. 7. 2 focal wedge-like areas of decreased enhancement in the right kidney on the delayed study, not conspicuous on the prior examination. Infarcts versus focal pyelonephritis. Correlate clinically and recommend attention on follow-up imaging. 8. Prostate enlargement with diffusely thick-walled urinary bladder suggesting a degree of bladder outlet obstruction. 9. Inguinal hernias containing only mesenteric fat.   Assessment and Plan: 74 year old male status post thoracic endograft doing for thoracic aortic aneurysm secondary to dissection.  He does have persistent dissection in the abdominal aorta with 4.8 cm aneurysm.  There is concern for a type I endoleak but it appears sealed after the first initial centimeters he has good remodeling of his aorta in the mid thoracic area.  Also he is improving from a stroke standpoint in about to begin PT and OT.  Also from his back pain standpoint he is to have injections will likely require surgery in the future according to the patient and his wife.  As long as this does not need to be an  anterior approach should be okay as an anterior approach would be complicated from his aortic aneurysm.  We will get him to follow-up again in 6 months with repeat CT angio dissection protocol.  Follow Up Instructions:   Follow up in 6 month(s)   I discussed the assessment and treatment plan with the patient. The patient was provided an opportunity to ask questions and all were answered. The patient agreed with the plan and demonstrated an understanding of the instructions.   The patient was advised to call back or seek an in-[place] evaluation if the symptoms worsen or if the condition fails to improve as anticipated.  I spent 11 minutes with the patient via telephone encounter and in record review..   Signed, Servando Snare [facility] Office: [phone]  01/15/2020, 2:22 PM

## 2020-01-21 ENCOUNTER — Ambulatory Visit: Payer: Medicare Other | Attending: Internal Medicine | Admitting: Speech Pathology

## 2020-01-21 ENCOUNTER — Other Ambulatory Visit: Payer: Self-pay

## 2020-01-21 ENCOUNTER — Encounter: Payer: Self-pay | Admitting: Speech Pathology

## 2020-01-21 DIAGNOSIS — M6281 Muscle weakness (generalized): Secondary | ICD-10-CM | POA: Diagnosis present

## 2020-01-21 DIAGNOSIS — R41842 Visuospatial deficit: Secondary | ICD-10-CM | POA: Diagnosis present

## 2020-01-21 DIAGNOSIS — R278 Other lack of coordination: Secondary | ICD-10-CM | POA: Insufficient documentation

## 2020-01-21 DIAGNOSIS — M545 Low back pain: Secondary | ICD-10-CM | POA: Insufficient documentation

## 2020-01-21 DIAGNOSIS — R41841 Cognitive communication deficit: Secondary | ICD-10-CM | POA: Diagnosis present

## 2020-01-21 DIAGNOSIS — R41840 Attention and concentration deficit: Secondary | ICD-10-CM | POA: Insufficient documentation

## 2020-01-21 DIAGNOSIS — Z8673 Personal history of transient ischemic attack (TIA), and cerebral infarction without residual deficits: Secondary | ICD-10-CM | POA: Insufficient documentation

## 2020-01-21 DIAGNOSIS — G8929 Other chronic pain: Secondary | ICD-10-CM | POA: Diagnosis present

## 2020-01-21 DIAGNOSIS — R2689 Other abnormalities of gait and mobility: Secondary | ICD-10-CM | POA: Diagnosis present

## 2020-01-21 DIAGNOSIS — R2681 Unsteadiness on feet: Secondary | ICD-10-CM | POA: Diagnosis present

## 2020-01-22 NOTE — Therapy (Signed)
[facility] [address] [place], [place], [address] Phone: [phone]   Fax:  [phone]  Speech Language Pathology Evaluation  Patient Details  Name: Collier Bohnet Chap MRN: [id number] Date of Birth: [birth year] Referring Provider (SLP): Dr. Letta Pate   Encounter Date: 01/21/2020  End of Session - 01/22/20 1804    Visit Number  1    Number of Visits  17    Date for SLP Re-Evaluation  04/20/20   90 days   Authorization Type  [employer] Medicare, $35 copay for all 3 disciplines on one day    SLP Start Time  1445    SLP Stop Time   1530    SLP Time Calculation (min)  45 min    Activity Tolerance  Patient tolerated treatment well       Past Medical History:  Diagnosis Date  . Chronic back pain    stenosis  . Enlarged prostate   . GERD (gastroesophageal reflux disease)    occasionally will take a zantac(maybe once a month)  . History of colon polyps   . History of kidney stones   . History of stress test    done 10 yrs. ago, as a baseline   . Hyperlipidemia    takes Crestor daily  . Hypertension    takes Amlodipine daily and Lotensin as well  . Stroke [facility])     Past Surgical History:  Procedure Laterality Date  . ANTERIOR LAT LUMBAR FUSION Left 12/15/2014   Procedure: ANTERIOR LATERAL LUMBAR FUSION 1 LEVEL;  Surgeon: Phylliss Bob, MD;  Location: [place];  Service: Orthopedics;  Laterality: Left;  Left sided lateral lumbar interbody fusion, lumbar 3-4, posterior spinal fusion, lumbar 3-4 with instrumentation.  . COLONOSCOPY    . ESOPHAGOGASTRODUODENOSCOPY    . fatty tissue removed from stomach    . LUMBAR LAMINECTOMY/DECOMPRESSION MICRODISCECTOMY N/A 07/08/2013   Procedure: LUMBAR LAMINECTOMY/DECOMPRESSION MICRODISCECTOMY;  Surgeon: Sinclair Ship, MD;  Location: [place];  Service: Orthopedics;  Laterality: N/A;  Lumbar 3-4, lumbar 4-5 decompression  . RADIOLOGY WITH ANESTHESIA N/A 10/22/2019   Procedure: MRI WITH  ANESTHESIA OF LUMBAR SPINE WITHOUT CONTRAST;  Surgeon: Radiologist, Medication, MD;  Location: [place];  Service: Radiology;  Laterality: N/A;  . right ankle surgery     as child   . THORACIC AORTIC ENDOVASCULAR STENT GRAFT N/A 10/15/2019   Procedure: THORACIC AORTIC ENDOVASCULAR STENT GRAFT;  Surgeon: Waynetta Sandy, MD;  Location: [place];  Service: Vascular;  Laterality: N/A;  . TONSILLECTOMY     as a child    There were no vitals filed for this visit.      SLP Evaluation [facility] - 01/22/20 0001      SLP Visit Information   SLP Received On  01/21/20    Referring Provider (SLP)  Dr. Letta Pate    Onset Date  10/08/19    Medical Diagnosis  CVA      Subjective   Patient/Family Stated Goal  play the guitar      General Information   HPI  74 y.o. right-handed male presented to [facility] with lower chest and epigastric pain, found to have aortic dissection and underwent vascular repair on 10/15/19, subsequently MRI on 10/17/19 showed acute bilateral cerebral and cerebellar infarcts consistent with emboli. 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   Overall Cognitive Status  Impaired/Different from baseline    Area of Impairment  --    Attention  Sustained    Sustained Attention  Impaired    Sustained Attention Impairment  Verbal complex;Functional complex    Memory  Impaired    Memory Impairment  Decreased short term memory;Prospective memory;Decreased recall of new  information    Awareness  Impaired    Awareness Impairment  Emergent impairment;Anticipatory impairment    Problem Solving  Impaired   attention impairment contributes   Executive Function  --   clock drawing was WNL however difficulties with higher level     Auditory Comprehension   Overall Auditory Comprehension  Appears within functional limits for tasks assessed      Visual Recognition/Discrimination   Discrimination  Not tested      Expression   Primary Mode of Expression  Verbal      Verbal Expression   Overall Verbal Expression  Appears within functional limits for tasks assessed   Pt reports wordfinding difficulties "a few times a week"   Level of Generative/Spontaneous Emergency planning/management officer      Written Expression   Dominant Hand  Right    Written Expression  Not tested      Oral Motor/Sensory Function   Overall Oral Motor/Sensory Function  --   did not assess due to masking; no dysarthria noted     Motor Speech   Overall Motor Speech  Appears within functional limits for tasks assessed      Standardized Assessments   Standardized Assessments   Cognitive Linguistic Quick Test   initiated, to be completed                     SLP Education - 01/22/20 1803    Education Details  proposed therapy goals    Rugg(s) Educated  Patient;Spouse    Methods  Explanation    Comprehension  Verbalized understanding       SLP Short Term Goals - 01/22/20 1814      SLP SHORT TERM GOAL #1   Title  pt will demo sustained attention for 10- minute functional task x 3, in 3 sessions    Time  4    Period  Weeks    Status  New      SLP SHORT TERM GOAL #2   Title  pt will demo the knowledge that he can use a memory system for daily events, ID'ing prospective and past events, medication management, and appointment tracking, etc x 3 sessions    Time  4    Period  Weeks    Status  New      SLP SHORT TERM GOAL #3   Title  pt will complete standardized  cognitive linguistic testing by visit 4    Time  2    Period  Weeks    Status  New       SLP Long Term Goals - 01/22/20 1815  SLP LONG TERM GOAL #1   Title  pt will demo selective attention for 10 minutes to perform simple cognitive linguistic tasks in a min-mod noisy environment x2 sessions    Time  8    Period  Weeks    Status  New      SLP LONG TERM GOAL #2   Title  pt will use memory system to record prospective or recall past events with occasional min A x 3 visits    Time  8    Period  Weeks    Status  New      SLP LONG TERM GOAL #3   Title  pt will use anomia compensations successfully in 10 minutes simple-mod complex conversation over 2 therapy sessions    Time  8    Period  Weeks    Status  New       Plan - 01/22/20 1806    Clinical Impression Statement  Mr. Mejorado is a pleasant 74 year old gentleman who presents with at least moderate cognitive deficits s/p CVA. Standardized testing initiated and to be completed in next 3 visits. Pt had history of cognitive deficits prior to CVA, and was undergoing w/u for dementia prior to hospitalization per MD notes. Prior infarct and white matter changes are noted on an MRI from 2011. 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 He managed finances and medications and helped with household chores prior to CVA, but wife is managing this now. I recommend skilled ST with emphasis on compensations to improve pt's functional recall and participation in simple household tasks.    Speech Therapy Frequency  2x / week    Duration  --   8 weeks or 17 visits   Treatment/Interventions  Compensatory strategies;Functional tasks;Patient/family education;Cognitive reorganization;Multimodal communcation approach;SLP instruction and feedback;Internal/external aids;Language facilitation;Compensatory techniques    Potential to Achieve Goals  Fair    Potential Considerations  Previous level of function     SLP Home Exercise Plan  Get planner or binder for pt for memory notebook    Consulted and Agree with Plan of Care  Patient;Family member/caregiver       Patient will benefit from skilled therapeutic intervention in order to improve the following deficits and impairments:   Cognitive communication deficit  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 History of repair of dissecting thoracic aneurysm   . Abdominal distension   . Pulmonary embolus ([place]) 10/19/2019  . CVA (cerebral vascular accident) ([place]) 10/19/2019  . AKI (acute kidney injury) ([place]) 10/19/2019  . 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 Ileus ([place]) 10/19/2019  . Dissection of thoracoabdominal aorta ([place])   . Aortic aneurysm with dissection ([place]) 10/08/2019  . Radiculopathy 12/15/2014   Deneise Lever, [place], Millard E Magdala Brahmbhatt 01/22/2020, 6:21 PM  [facility] [address] [place], [place], [address] Phone: [phone]   Fax:  [phone]  Name: Damyn Weitzel Hedtke MRN: [id number] Date of Birth: 04-Feb-1946

## 2020-01-26 ENCOUNTER — Encounter: Payer: Self-pay | Admitting: Occupational Therapy

## 2020-01-26 ENCOUNTER — Ambulatory Visit: Payer: Medicare Other | Admitting: Occupational Therapy

## 2020-01-26 ENCOUNTER — Ambulatory Visit: Payer: Medicare Other

## 2020-01-26 ENCOUNTER — Other Ambulatory Visit: Payer: Self-pay

## 2020-01-26 DIAGNOSIS — M6281 Muscle weakness (generalized): Secondary | ICD-10-CM

## 2020-01-26 DIAGNOSIS — R278 Other lack of coordination: Secondary | ICD-10-CM

## 2020-01-26 DIAGNOSIS — M545 Low back pain, unspecified: Secondary | ICD-10-CM

## 2020-01-26 DIAGNOSIS — R41842 Visuospatial deficit: Secondary | ICD-10-CM

## 2020-01-26 DIAGNOSIS — R41841 Cognitive communication deficit: Secondary | ICD-10-CM | POA: Diagnosis not present

## 2020-01-26 DIAGNOSIS — R41840 Attention and concentration deficit: Secondary | ICD-10-CM

## 2020-01-26 DIAGNOSIS — R2681 Unsteadiness on feet: Secondary | ICD-10-CM

## 2020-01-26 DIAGNOSIS — R2689 Other abnormalities of gait and mobility: Secondary | ICD-10-CM

## 2020-01-26 NOTE — Therapy (Signed)
[facility] [address] [place], [place], [address] Phone: [phone]   Fax:  [phone]  Occupational Therapy Evaluation  Patient Details  Name: [name] [name] MRN: [id number] Date of Birth: 08/29/1945 No data recorded  Encounter Date: 01/26/2020  OT End of Session - 01/26/20 1904    Visit Number  1    Number of Visits  9    Date for OT Re-Evaluation  03/11/20    Authorization Type  [employer] Medicare    Progress Note Due on Visit  10    OT Start Time  1615    OT Stop Time  1700    OT Time Calculation (min)  45 min    Activity Tolerance  Patient tolerated treatment well    Behavior During Therapy  [facility] for tasks assessed/performed       Past Medical History:  Diagnosis Date  . Chronic back pain    stenosis  . Enlarged prostate   . GERD (gastroesophageal reflux disease)    occasionally will take a zantac(maybe once a month)  . History of colon polyps   . History of kidney stones   . History of stress test    done 10 yrs. ago, as a baseline   . Hyperlipidemia    takes Crestor daily  . Hypertension    takes Amlodipine daily and Lotensin as well  . Stroke [facility])     Past Surgical History:  Procedure Laterality Date  . ANTERIOR LAT LUMBAR FUSION Left 12/15/2014   Procedure: ANTERIOR LATERAL LUMBAR FUSION 1 LEVEL;  Surgeon: Phylliss Bob, MD;  Location: [place];  Service: Orthopedics;  Laterality: Left;  Left sided lateral lumbar interbody fusion, lumbar 3-4, posterior spinal fusion, lumbar 3-4 with instrumentation.  . COLONOSCOPY    . ESOPHAGOGASTRODUODENOSCOPY    . fatty tissue removed from stomach    . LUMBAR LAMINECTOMY/DECOMPRESSION MICRODISCECTOMY N/A 07/08/2013   Procedure: LUMBAR LAMINECTOMY/DECOMPRESSION MICRODISCECTOMY;  Surgeon: Sinclair Ship, MD;  Location: [place];  Service: Orthopedics;  Laterality: N/A;  Lumbar 3-4, lumbar 4-5 decompression  . RADIOLOGY WITH ANESTHESIA N/A 10/22/2019   Procedure:  MRI WITH ANESTHESIA OF LUMBAR SPINE WITHOUT CONTRAST;  Surgeon: Radiologist, Medication, MD;  Location: [place];  Service: Radiology;  Laterality: N/A;  . right ankle surgery     as child   . THORACIC AORTIC ENDOVASCULAR STENT GRAFT N/A 10/15/2019   Procedure: THORACIC AORTIC ENDOVASCULAR STENT GRAFT;  Surgeon: Waynetta Sandy, MD;  Location: [place];  Service: Vascular;  Laterality: N/A;  . TONSILLECTOMY     as a child    There were no vitals filed for this visit.  Subjective Assessment - 01/26/20 1624    Subjective   I am not as mentally sharp as I was    Patient is accompanied by:  Family member    Pertinent History  Back surgery    Limitations  Blood pressure    Currently in Pain?  Yes    Pain Score  6     Pain Location  Back    Pain Orientation  Lower    Pain Descriptors / Indicators  Sharp    Pain Type  Chronic pain    Pain Onset  More than a month ago    Pain Frequency  Constant    Aggravating Factors   movement, laying down    Pain Relieving Factors  heat, tylenol        [facility] OT Assessment - 01/26/20 1630  Assessment   Medical Diagnosis  cerebellar CVA    Onset Date/Surgical Date  10/08/19    Hand Dominance  Right    Prior Therapy  inpatient rehab and home health      Precautions   Precautions  Fall    Precaution Comments  history of lumbar fusion      Balance Screen   Has the patient fallen in the past 6 months  Yes    How many times?  1    Has the patient had a decrease in activity level because of a fear of falling?   Yes      Prior Function   Level of Independence  Independent with basic ADLs    Vocation  Retired    Leisure  "Cheri Rous my wife around," piano      ADL   Eating/Feeding  Independent    Grooming  Modified independent    Upper Body Bathing  Modified independent    Lower Body Bathing  Modified independent    Upper Body Dressing  Independent    Lower Body Dressing  Modified independent    Toilet Transfer  --   increased time -  uses a urinal at night   Toileting - Pension scheme manager independent    Toileting -  Manufacturing engineer Used  Rolling walker   BSC     IADL   Prior Level of Function Shopping  Independent    Shopping  Needs to be accompanied on any shopping trip    Prior Level of Function Light Housekeeping  Independent    Light Housekeeping  Performs light daily tasks but cannot maintain acceptable level of cleanliness    Prior Level of Function Meal Prep  NA    Prior Level of Function Pharmacologist  Relies on family or friends for transportation    Prior Level of Function Medication Managment  independent    Medication Management  Is not capable of dispensing or managing own medication    Prior Level of Function Financial Management  Independent    Financial Management  Requires assistance      Written Expression   Dominant Hand  Right    Handwriting  100% legible;Increased time      Vision - History   Baseline Vision  Wears glasses all the time      Vision Assessment   Eye Alignment  Impaired (comment)    Ocular Range of Motion  Restricted on looking up    Alignment/Gaze Preference  Other (comment)    Tracking/Visual Pursuits  Decreased smoothing of vertical tracking    Saccades  Decreased speed of saccadic movement    Convergence  Impaired (comment)    Visual Fields  No apparent deficits    Diplopia Assessment  Present in primary gaze    Comment  Diplopia - long standing      Cognition   Overall Cognitive Status  Impaired/Different from baseline    Area of Impairment  Attention;Memory;Following commands;Safety/judgement;Awareness    Current Attention Level  Sustained    Memory  Decreased short-term memory    Memory Comments  Looks to wife for responses    Following Commands  Follows one step commands consistently     Safety/Judgement  Decreased awareness of deficits    Awareness  Emergent  Posture/Postural Control   Posture/Postural Control  Postural limitations    Posture Comments  Limited movement - longstanding history of back pain      Sensation   Light Touch  Appears Intact    Stereognosis  Appears Intact      Hand Function   Right Hand Gross Grasp  Functional    Right Hand Grip (lbs)  60.5    Right Hand Lateral Pinch  17 lbs    Left Hand Gross Grasp  Impaired    Left Hand Grip (lbs)  45.6    Left Hand Lateral Pinch  24 lbs                      OT Education - 01/26/20 1904    Education Details  evaluation findings and potential plan of care    Hearne(s) Educated  Patient;Spouse    Methods  Explanation    Comprehension  Need further instruction;Verbalized understanding          OT Long Term Goals - 01/26/20 1915      OT LONG TERM GOAL #1   Title  Patient will complete an HEP to improve strength and coordination in non dominant LUE due 03/11/20    Time  4    Period  Weeks    Status  New    Target Date  03/11/20      OT LONG TERM GOAL #2   Title  Patient will complete 9 hole peg test with no less than 5 second difference betweeen right (dominant) and left hand to aide with manipulation of small objects.    Time  4    Period  Weeks    Status  New      OT LONG TERM GOAL #3   Title  Patient and his wife will demonstrate understanding of driving recommendations    Time  4    Period  Weeks    Status  New      OT LONG TERM GOAL #4   Title  Patient will return to housekeeping - sweeping, dusting, vaccuuming without loss of balance, and with pain no greater than 6/10 back    Time  4    Period  Weeks    Status  New            Plan - 01/26/20 1909    Clinical Impression Statement  Patient is a 74 year old man with long standing history of back pain, who in February suffered an aortic aneurysm followed by small acute bilateral cerebral and cerebellar  infarcts.  Patient received intensive inpatient rehab and then home health therapy.  Patient presents to OP OT today with back and leg pain, decreased balance, decreased activity tolerance, decreased oculomotor control, decreased attention, memory, and awareness, as well as left hand weakness all of which impede performance of ADL/IADL.  Patient will benefit from skilled OT intervention to increase autonomy with IADl  and to improve functional mobility.    OT Occupational Profile and History  Detailed Assessment- Review of Records and additional review of physical, cognitive, psychosocial history related to current functional performance    Occupational performance deficits (Please refer to evaluation for details):  ADL's;IADL's;Leisure    Body Structure / Function / Physical Skills  ADL;Coordination;Endurance;GMC;Muscle spasms;UE functional use;Vestibular;Decreased knowledge of precautions;Balance;Body mechanics;Decreased knowledge of use of DME;Flexibility;IADL;Pain;Vision;Strength;FMC;Dexterity    Cognitive Skills  Attention;Safety Awareness;Problem Solve;Thought;Sequencing    Rehab Potential  Good    Clinical Decision Making  Several treatment options, min-mod task modification necessary    Comorbidities Affecting Occupational Performance:  Presence of comorbidities impacting occupational performance    Comorbidities impacting occupational performance description:  HTN, BPH, Prior back surgeries    Modification or Assistance to Complete Evaluation   No modification of tasks or assist necessary to complete eval    OT Frequency  2x / week    OT Duration  4 weeks    OT Treatment/Interventions  Self-care/ADL training;Therapeutic exercise;Visual/perceptual remediation/compensation;Patient/family education;Neuromuscular education;Moist Heat;Functional Furniture conservator/restorer;Therapeutic activities;Balance training;Manual Therapy;DME and/or AE instruction;Cryotherapy;Aquatic Therapy    Plan  Begin HEP - hand  strengthening - left, functional mobility in home like environment    Consulted and Agree with Plan of Care  Patient;Family member/caregiver       Patient will benefit from skilled therapeutic intervention in order to improve the following deficits and impairments:   Body Structure / Function / Physical Skills: ADL, Coordination, Endurance, GMC, Muscle spasms, UE functional use, Vestibular, Decreased knowledge of precautions, Balance, Body mechanics, Decreased knowledge of use of DME, Flexibility, IADL, Pain, Vision, Strength, FMC, Dexterity Cognitive Skills: Attention, Safety Awareness, Problem Solve, Thought, Sequencing     Visit Diagnosis: Attention and concentration deficit - Plan: Ot plan of care cert/re-cert  Visuospatial deficit - Plan: Ot plan of care cert/re-cert  Muscle weakness (generalized) - Plan: Ot plan of care cert/re-cert  Other lack of coordination - Plan: Ot plan of care cert/re-cert  Unsteadiness on feet - Plan: Ot plan of care cert/re-cert    Problem List Patient Active Problem List   Diagnosis Date Noted  . 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 History of repair of dissecting thoracic aneurysm   . Abdominal distension   . Pulmonary embolus ([place]) 10/19/2019  . CVA (cerebral vascular accident) ([place]) 10/19/2019  . AKI (acute kidney injury) ([place]) 10/19/2019  . 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 Ileus ([place]) 10/19/2019  . 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 Radiculopathy 12/15/2014    Mariah Milling, OTR/L 01/26/2020, 7:22 PM  [facility] [address] [place], [place], [address] Phone: [phone]   Fax:  [phone]  Name: Rohen Kimes Nelles MRN: [id number] Date of Birth: 03/10/1946

## 2020-01-27 NOTE — Therapy (Signed)
[facility] [address] [place], [place], [address] Phone: [phone]   Fax:  [phone]  Physical Therapy Evaluation  Patient Details  Name: [name] [name] MRN: [id number] Date of Birth: [birth year] Referring Provider (PT): Alysia Penna   Encounter Date: 01/26/2020  PT End of Session - 01/26/20 1619    Visit Number  1    Number of Visits  17    Date for PT Re-Evaluation  61/60/73   90 day cert but 60 day poc   Authorization Type  Medicare payer so 10th visit progress note, FOTO    PT Start Time  1530    PT Stop Time  1615    PT Time Calculation (min)  45 min    Activity Tolerance  Patient limited by pain    Behavior During Therapy  [facility] for tasks assessed/performed       Past Medical History:  Diagnosis Date  . Chronic back pain    stenosis  . Enlarged prostate   . GERD (gastroesophageal reflux disease)    occasionally will take a zantac(maybe once a month)  . History of colon polyps   . History of kidney stones   . History of stress test    done 10 yrs. ago, as a baseline   . Hyperlipidemia    takes Crestor daily  . Hypertension    takes Amlodipine daily and Lotensin as well  . Stroke [facility])     Past Surgical History:  Procedure Laterality Date  . ANTERIOR LAT LUMBAR FUSION Left 12/15/2014   Procedure: ANTERIOR LATERAL LUMBAR FUSION 1 LEVEL;  Surgeon: Phylliss Bob, MD;  Location: [place];  Service: Orthopedics;  Laterality: Left;  Left sided lateral lumbar interbody fusion, lumbar 3-4, posterior spinal fusion, lumbar 3-4 with instrumentation.  . COLONOSCOPY    . ESOPHAGOGASTRODUODENOSCOPY    . fatty tissue removed from stomach    . LUMBAR LAMINECTOMY/DECOMPRESSION MICRODISCECTOMY N/A 07/08/2013   Procedure: LUMBAR LAMINECTOMY/DECOMPRESSION MICRODISCECTOMY;  Surgeon: Sinclair Ship, MD;  Location: [place];  Service: Orthopedics;  Laterality: N/A;  Lumbar 3-4, lumbar 4-5 decompression  .  RADIOLOGY WITH ANESTHESIA N/A 10/22/2019   Procedure: MRI WITH ANESTHESIA OF LUMBAR SPINE WITHOUT CONTRAST;  Surgeon: Radiologist, Medication, MD;  Location: [place];  Service: Radiology;  Laterality: N/A;  . right ankle surgery     as child   . THORACIC AORTIC ENDOVASCULAR STENT GRAFT N/A 10/15/2019   Procedure: THORACIC AORTIC ENDOVASCULAR STENT GRAFT;  Surgeon: Waynetta Sandy, MD;  Location: [place];  Service: Vascular;  Laterality: N/A;  . TONSILLECTOMY     as a child    There were no vitals filed for this visit.   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  Small pulmonary embolus involving the central right lobe pulmonary artery as well as probable additional small subsegmental embolus in the right lower lobe. 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 Donzetta Matters. Neurology consulted 10/17/2019 for altered mental status.  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 Inpatient rehab 3/6-3/26/21. Pt then had home health therapy which finished 12/10/19. Pt is using RW when goes out of house and wheelchair in home due to pain and discomfort in low back. Pt reports that back limited his walking prior to  all of this happening. Pt reports that laying down and movement bothers back. Pt and wife report that a 3rd back surgery will be needed in future when he is cleared. Pt sleeps on side to help. Pt went to try to get epidural yesterday and was told that he would have to wait a month. Spinal stenosis is most limiting issue. Prior to recent issues, pt was walking independently without AD.    Patient is accompained by:  Family member   wife, Barbaraann Share   Pertinent History  74 y.o. right-handed male with history of chronic back pain with lumbar  discectomy 2014 as well as anterior lateral lumbar fusion 2016, hypertension, diabetes mellitus, hyperlipidemia and remote tobacco abuse.    Patient Stated Goals  Pt would like to be able to have sex.    Currently in Pain?  Yes    Pain Score  10-Worst pain ever    Pain Location  Back    Pain Orientation  Lower    Pain Descriptors / Indicators  Sharp    Pain Type  Chronic pain    Pain Radiating Towards  in to hips and thighs    Pain Onset  More than a month ago    Pain Frequency  Constant    Aggravating Factors   laying, sit to stand, longer walking         [facility] PT Assessment - 01/26/20 1545      Assessment   Medical Diagnosis  cerebellar CVA    Referring Provider (PT)  Alysia Penna    Onset Date/Surgical Date  10/08/19    Hand Dominance  Right    Prior Therapy  inpatient rehab, and home health       Precautions   Precautions  Fall    Precaution Comments  history of lumbar fusion      Balance Screen   Has the patient fallen in the past 6 months  Yes    How many times?  1   was going to get up and slid down   Has the patient had a decrease in activity level because of a fear of falling?   Yes    Is the patient reluctant to leave their home because of a fear of falling?   No      Home Environment   Living Environment  Private residence    Living Arrangements  Spouse/significant other    Available Help at Discharge  Family    Type of [place] Access  Level entry    [place]  Two level;Able to live on main level with bedroom/bathroom   his bedroom is upstairs and has returned back there   Alternate Level Stairs-Number of Steps  12    Alternate Level Stairs-Rails  Right    [place] - 2 wheels;Wheelchair - [employer];Tub bench      Prior Function   Level of Independence  Independent    Vocation  Retired    Leisure  have sex, Chiropodist, piano      Cognition   Overall Cognitive Status  Impaired/Different from baseline    Memory   Impaired      Sensation   Additional Comments  pt reports legs feel a little less normal. Pt is able to feel light touch throughout with testing.       Coordination   Gross Motor Movements are Fluid and Coordinated  No   some dysmetria with RAMs  Fine Motor Movements are Fluid and Coordinated  Yes      ROM / Strength   AROM / PROM / Strength  Strength      Strength   Strength Assessment Site  Shoulder;Elbow;Hand;Hip;Knee;Ankle    Right/Left Shoulder  Right;Left    Right Shoulder Flexion  5/5    Left Shoulder Flexion  5/5    Right/Left Elbow  Right;Left    Right Elbow Flexion  5/5    Right Elbow Extension  5/5    Left Elbow Flexion  5/5    Left Elbow Extension  5/5    Right/Left hand  Right;Left    Right Hand Gross Grasp  Functional    Left Hand Gross Grasp  Functional    Right/Left Hip  Right;Left    Right Hip Flexion  4/5    Left Hip Flexion  4/5    Right/Left Knee  Right;Left    Right Knee Flexion  4/5    Right Knee Extension  4+/5    Left Knee Flexion  3+/5    Left Knee Extension  4+/5    Right/Left Ankle  Right;Left    Right Ankle Dorsiflexion  4+/5    Left Ankle Dorsiflexion  4+/5      Bed Mobility   Bed Mobility  Rolling Right;Rolling Left;Supine to Sit;Sit to Supine    Rolling Right  Supervision/verbal cueing    Rolling Left  Supervision/Verbal cueing    Supine to Sit  Supervision/Verbal cueing   needed verbal cues for log roll   Sit to Supine  Supervision/Verbal cueing      Transfers   Transfers  Sit to Stand;Stand to Sit    Sit to Stand  5: Supervision;4: Min guard    Sit to Stand Details  Verbal cues for technique    Sit to Stand Details (indicate cue type and reason)  Needs heavy UE assist to rise with increased time.    Stand to Sit  5: Supervision    Stand to Sit Details (indicate cue type and reason)  Verbal cues for technique    Stand to Sit Details  Pt reports increased pain in thighs with going to sit      Ambulation/Gait   Ambulation/Gait   Yes    Ambulation/Gait Assistance  5: Supervision    Ambulation Distance (Feet)  75 Feet    Assistive device  Rolling walker    Gait Pattern  Step-through pattern;Decreased trunk rotation    Ambulation Surface  Level;Indoor    Gait velocity  16.95 sec=0.56m/s    Stairs  Yes    Stairs Assistance  5: Supervision    Stair Management Technique  One rail Right;Step to pattern    Number of Stairs  4      Standardized Balance Assessment   Standardized Balance Assessment  Timed Up and Go Test      Timed Up and Go Test   TUG  Normal TUG    Normal TUG (seconds)  29                  Objective measurements completed on examination: See above findings.              PT Education - 01/27/20 0717    Education Details  Pt instructed to get up and walk more frequently in home at least every 1-2 hours to prevent increased pain and stiffness in back. Discussed PT plan of care. Also educated on log roll technique with bed  mobility and performed sit to/from supine x 2.    [place](s) Educated  Patient;Spouse    Methods  Explanation    Comprehension  Verbalized understanding       PT Short Term Goals - 01/27/20 0737      PT SHORT TERM GOAL #1   Title  Pt will be independent with initial HEP for strength and flexibility and balance.    Time  4    Period  Weeks    Status  New    Target Date  02/26/20      PT SHORT TERM GOAL #2   Title  Pt will be instructed in back safety including proper body mechanics and positioning to better manage back pain and verbalize understanding.    Time  4    Period  Weeks    Status  New    Target Date  02/26/20      PT SHORT TERM GOAL #3   Title  Pt will be able to perform transfers sit to stand from varied surfaces mod I for improved mobility and functional strength.    Time  4    Period  Weeks    Status  New    Target Date  02/26/20      PT SHORT TERM GOAL #4   Title  Pt will ambulate with RW >300' on varied surfaces mod I for improved  mobility.    Time  4    Period  Weeks    Status  New    Target Date  02/26/20        PT Long Term Goals - 01/27/20 0741      PT LONG TERM GOAL #1   Title  Pt will be independent in progressive HEP for strength, balance and functional mobility to continue gains on own.    Time  8    Period  Weeks    Status  New    Target Date  03/27/20      PT LONG TERM GOAL #2   Title  Pt will increase gait speed from 0.57m/s to >0.90m/s for improved community ambulation.    Baseline  0.70m/s on 01/26/20    Time  8    Period  Weeks    Status  New    Target Date  03/27/20      PT LONG TERM GOAL #3   Title  Pt will decrease TUG from 29 sec to <24 sec for improved balance and functional strength to decrease fall risk.    Baseline  29 sec on 01/26/20    Time  8    Period  Weeks    Status  New    Target Date  03/27/20      PT LONG TERM GOAL #4   Title  Pt will ambulate >400' on varied surfaces with LRAD versus no AD for short community distances mod I.    Time  8    Period  Weeks    Status  New    Target Date  03/27/20      PT LONG TERM GOAL #5   Title  Pt will ambulate up/down a flight of stairs with 1 rail right mod I for improved safety in home.    Time  8    Period  Weeks    Status  New    Target Date  03/27/20      Additional Long Term Goals   Additional Long Term Goals  Yes  PT LONG TERM GOAL #6   Title  Berg goal TBD    Time  8    Period  Weeks    Status  New    Target Date  03/27/20             Plan - 01/27/20 0719    Clinical Impression Statement  Pt is 74 y/o male who was hospitalized for aortic dissection with repair who also experienced acute bilateral cerebral and cerebellar infarcts on 10/17/19. Pt has long history of chronic back pain with 2 surgeries in the past and reports he is awaiting 3rd surgery once he gets clearance. Back pain limits him with all activities. Reports pain 10/10. Pt does present with weakness in BLE most prevalent around hips and in  hamstrings. Pt is supervision/CGA with transfer relying heavily on UE support to rise. Pt ambulates with RW with slow, reciprocal gait. Gait speed of 0.21m/s indicates household ambulator but not safe for community. Pt is fall risk based on TUG score of 29 sec. Pt will benefit from skilled PT to address strength, balance and functional mobility deficits as well as educate on ways to decrease back pain and protect his back.    Personal Factors and Comorbidities  Comorbidity 3+    Comorbidities  74 y.o. right-handed male with history of chronic back pain with lumbar discectomy 2014 as well as anterior lateral lumbar fusion 2016, hypertension, diabetes mellitus, hyperlipidemia and remote tobacco abuse.    Examination-Activity Limitations  Bed Mobility;Transfers;Locomotion Level;Stairs    Examination-Participation Restrictions  Community Activity;Driving;Cleaning;Interpersonal Relationship    Stability/Clinical Decision Making  Evolving/Moderate complexity    Clinical Decision Making  Moderate    Rehab Potential  Good    PT Frequency  2x / week    PT Duration  8 weeks    PT Treatment/Interventions  ADLs/Self Care Home Management;DME Instruction;Moist Heat;Cryotherapy;Gait training;Stair training;Functional mobility training;Therapeutic activities;Neuromuscular re-education;Balance training;Therapeutic exercise;Patient/family education;Manual techniques;Passive range of motion;Vestibular    PT Next Visit Plan  Check vitals to get baseline, Assess Berg Balance test and update goal. Review log roll with bed mobility, Begin strengthening bilateral LE with emphasis on hip and hamstring as tolerated due to back pain    Consulted and Agree with Plan of Care  Patient;Family member/caregiver    Family Member Consulted  wife, Barbaraann Share       Patient will benefit from skilled therapeutic intervention in order to improve the following deficits and impairments:  Abnormal gait, Decreased activity tolerance, Decreased  knowledge of use of DME, Decreased balance, Decreased endurance, Decreased range of motion, Decreased strength, Decreased mobility, Pain, Postural dysfunction, Impaired flexibility  Visit Diagnosis: Other abnormalities of gait and mobility  Muscle weakness (generalized)  Chronic bilateral low back pain, unspecified whether sciatica present     Problem List Patient Active Problem List   Diagnosis Date Noted  . 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 History of repair of dissecting thoracic aneurysm   . Abdominal distension   . Pulmonary embolus ([place]) 10/19/2019  . CVA (cerebral vascular accident) ([place]) 10/19/2019  . AKI (acute kidney injury) ([place]) 10/19/2019  . 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 Ileus ([place]) 10/19/2019  . Dissection of thoracoabdominal aorta ([place])   . Aortic aneurysm with dissection ([place]) 10/08/2019  . Radiculopathy 12/15/2014    Electa Sniff, PT, DPT, NCS 01/27/2020, 7:45 AM  [facility] [address]  [place], [place], [address] Phone: [phone]   Fax:  [phone]  Name: [name] [name] MRN: [id number] Date of Birth: Jul 06, 1946

## 2020-02-01 ENCOUNTER — Ambulatory Visit: Payer: Medicare Other | Admitting: Occupational Therapy

## 2020-02-01 ENCOUNTER — Ambulatory Visit: Payer: Medicare Other

## 2020-02-01 ENCOUNTER — Other Ambulatory Visit: Payer: Self-pay

## 2020-02-01 DIAGNOSIS — Z8673 Personal history of transient ischemic attack (TIA), and cerebral infarction without residual deficits: Secondary | ICD-10-CM

## 2020-02-01 DIAGNOSIS — R2681 Unsteadiness on feet: Secondary | ICD-10-CM

## 2020-02-01 DIAGNOSIS — M6281 Muscle weakness (generalized): Secondary | ICD-10-CM

## 2020-02-01 DIAGNOSIS — M545 Low back pain, unspecified: Secondary | ICD-10-CM

## 2020-02-01 DIAGNOSIS — R41841 Cognitive communication deficit: Secondary | ICD-10-CM | POA: Diagnosis not present

## 2020-02-01 DIAGNOSIS — R2689 Other abnormalities of gait and mobility: Secondary | ICD-10-CM

## 2020-02-01 NOTE — Therapy (Signed)
[facility] [address] [place], [place], [address] Phone: [phone]   Fax:  [phone]  Physical Therapy Treatment  Patient Details  Name: [name] [name] MRN: [id number] Date of Birth: 05/21/1946 Referring Provider (PT): Alysia Penna   Encounter Date: 02/01/2020   PT End of Session - 02/01/20 1830    Visit Number 2    Number of Visits 17    Date for PT Re-Evaluation 15/17/75   90 day cert but 60 day poc   Authorization Type Medicare payer so 10th visit progress note, FOTO    PT Start Time 1245    PT Stop Time 1330    PT Time Calculation (min) 45 min    Equipment Utilized During Treatment Gait belt    Activity Tolerance Patient limited by pain    Behavior During Therapy [facility] for tasks assessed/performed           Past Medical History:  Diagnosis Date  . Chronic back pain    stenosis  . Enlarged prostate   . GERD (gastroesophageal reflux disease)    occasionally will take a zantac(maybe once a month)  . History of colon polyps   . History of kidney stones   . History of stress test    done 10 yrs. ago, as a baseline   . Hyperlipidemia    takes Crestor daily  . Hypertension    takes Amlodipine daily and Lotensin as well  . Stroke [facility])     Past Surgical History:  Procedure Laterality Date  . ANTERIOR LAT LUMBAR FUSION Left 12/15/2014   Procedure: ANTERIOR LATERAL LUMBAR FUSION 1 LEVEL;  Surgeon: Phylliss Bob, MD;  Location: [place];  Service: Orthopedics;  Laterality: Left;  Left sided lateral lumbar interbody fusion, lumbar 3-4, posterior spinal fusion, lumbar 3-4 with instrumentation.  . COLONOSCOPY    . ESOPHAGOGASTRODUODENOSCOPY    . fatty tissue removed from stomach    . LUMBAR LAMINECTOMY/DECOMPRESSION MICRODISCECTOMY N/A 07/08/2013   Procedure: LUMBAR LAMINECTOMY/DECOMPRESSION MICRODISCECTOMY;  Surgeon: Sinclair Ship, MD;  Location: [place];  Service: Orthopedics;  Laterality: N/A;   Lumbar 3-4, lumbar 4-5 decompression  . RADIOLOGY WITH ANESTHESIA N/A 10/22/2019   Procedure: MRI WITH ANESTHESIA OF LUMBAR SPINE WITHOUT CONTRAST;  Surgeon: Radiologist, Medication, MD;  Location: [place];  Service: Radiology;  Laterality: N/A;  . right ankle surgery     as child   . THORACIC AORTIC ENDOVASCULAR STENT GRAFT N/A 10/15/2019   Procedure: THORACIC AORTIC ENDOVASCULAR STENT GRAFT;  Surgeon: Waynetta Sandy, MD;  Location: [place];  Service: Vascular;  Laterality: N/A;  . TONSILLECTOMY     as a child    There were no vitals filed for this visit.   Subjective Assessment - 02/01/20 1454    Subjective Back pain limits him a lot. Scheduled epidural on 02/23/20. Had laminectomy L3-5 and L3-4 lumbar fusion.    Patient is accompained by: Family member    Pertinent History 74 y.o. right-handed male with history of chronic back pain with lumbar discectomy 2014 as well as anterior lateral lumbar fusion 2016, hypertension, diabetes mellitus, hyperlipidemia and remote tobacco abuse.    Patient Stated Goals Pt would like to be able to have sex.              [facility] PT Assessment - 02/01/20 1459      Standardized Balance Assessment   Standardized Balance Assessment Berg Balance Test;Five Times Sit to Stand;10 meter walk test  10 Meter Walk --      Berg Balance Test   Sit to Stand Able to stand without using hands and stabilize independently    Standing Unsupported Able to stand safely 2 minutes    Sitting with Back Unsupported but Feet Supported on Floor or Stool Able to sit safely and securely 2 minutes    Stand to Sit Sits safely with minimal use of hands    Transfers Able to transfer safely, minor use of hands    Standing Unsupported with Eyes Closed Able to stand 10 seconds safely    Standing Unsupported with Feet Together Able to place feet together independently and stand 1 minute safely    From Standing, Reach Forward with Outstretched Arm Can reach confidently >25 cm  (10")    From Standing Position, Pick up Object from Floor Able to pick up shoe, needs supervision    From Standing Position, Turn to Look Behind Over each Shoulder Looks behind one side only/other side shows less weight shift    Turn 360 Degrees Able to turn 360 degrees safely but slowly   able to turn in 6 sec   Standing Unsupported, Alternately Place Feet on Step/Stool Needs assistance to keep from falling or unable to try    Standing Unsupported, One Foot in Front Needs help to step but can hold 15 seconds    Standing on One Leg Tries to lift leg/unable to hold 3 seconds but remains standing independently    Total Score 42                Performed BBS, 10 meter walk test, 5x sit to stand test Sit to stand: 10x, bil UE support, nose over toes cues with tactile cues for proper anterior weight shift Bed mobility: Sit to SL to supine: R side: 5x cues for log roll, keeping hips and knees together, needed min A 2/5 times from R SL to sitting Therapeutic exercises: Knee to chest: 10 x 10" holds Lower trunk rotations: 15x SLR: 2 x 10 R and L                     PT Short Term Goals - 02/01/20 1827      PT SHORT TERM GOAL #1   Title Pt will be independent with initial HEP for strength and flexibility and balance.    Time 4    Period Weeks    Status New    Target Date 02/26/20      PT SHORT TERM GOAL #2   Title Pt will be instructed in back safety including proper body mechanics and positioning to better manage back pain and verbalize understanding.    Time 4    Period Weeks    Status New    Target Date 02/26/20      PT SHORT TERM GOAL #3   Title Pt will be able to perform transfers sit to stand from varied surfaces mod I for improved mobility and functional strength.    Time 4    Period Weeks    Status New    Target Date 02/26/20      PT SHORT TERM GOAL #4   Title Pt will ambulate with RW >300' on varied surfaces mod I for improved mobility.    Time 4      Period Weeks    Status New    Target Date 02/26/20      PT SHORT TERM GOAL #5   Title  Patient will be able to perform 5x sit to stand without UE support to improve functional strength    Baseline 0 reps (02/01/20)    Time 4    Period Weeks    Status New    Target Date 02/29/20             PT Long Term Goals - 02/01/20 1825      PT LONG TERM GOAL #1   Title Pt will be independent in progressive HEP for strength, balance and functional mobility to continue gains on own.    Baseline HEP issued 02/01/20    Time 8    Period Weeks    Status New      PT LONG TERM GOAL #2   Title Pt will increase gait speed from 0.64m/s to >0.31m/s for improved community ambulation.    Baseline 0.79m/s on 01/26/20    Time 8    Period Weeks    Status New      PT LONG TERM GOAL #3   Title Pt will decrease TUG from 29 sec to <24 sec for improved balance and functional strength to decrease fall risk.    Baseline 29 sec on 01/26/20    Time 8    Period Weeks    Status New      PT LONG TERM GOAL #4   Title Pt will ambulate >400' on varied surfaces with LRAD versus no AD for short community distances mod I.    Time 8    Period Weeks    Status New      PT LONG TERM GOAL #5   Title Pt will ambulate up/down a flight of stairs with 1 rail right mod I for improved safety in home.    Time 8    Period Weeks    Status New      PT LONG TERM GOAL #6   Title Pt will demo >50/56 on BBS to improve balance and reduce fall risk    Baseline 42/56 (02/01/20)    Time 8    Period Weeks    Status New                 Plan - 02/01/20 1821    Clinical Impression Statement Patient demonstrated 42/56 on BBS which demonstrates increased risk for fall. Patient was not able to perform sit to stand without UE support surface which demonstrates significant functional wweakness with transfers. patient demo walking speed of 0.78m/s which indicates limited community ambulation. Patient demo no significant improvement  in his pain with exercises. Patient verbalized understanding of compliance with HEP     Personal Factors and Comorbidities Comorbidity 3+    Comorbidities 74 y.o. right-handed male with history of chronic back pain with lumbar discectomy 2014 as well as anterior lateral lumbar fusion 2016, hypertension, diabetes mellitus, hyperlipidemia and remote tobacco abuse.    Examination-Activity Limitations Bed Mobility;Transfers;Locomotion Level;Stairs    Examination-Participation Restrictions Community Activity;Driving;Cleaning;Interpersonal Relationship    Stability/Clinical Decision Making Evolving/Moderate complexity    Rehab Potential Good    PT Frequency 2x / week    PT Duration 8 weeks    PT Treatment/Interventions ADLs/Self Care Home Management;DME Instruction;Moist Heat;Cryotherapy;Gait training;Stair training;Functional mobility training;Therapeutic activities;Neuromuscular re-education;Balance training;Therapeutic exercise;Patient/family education;Manual techniques;Passive range of motion;Vestibular    PT Next Visit Plan review HEP, practice sit to stand, continue with balance/strengthening as tolerated    PT Home Exercise Plan 02/01/20 (sit to stand bil UE support, SLR, lower trunk rotations, knee to chest (10  x 10" holds)    Consulted and Agree with Plan of Care Patient;Family member/caregiver    Family Member Consulted wife, Barbaraann Share           Patient will benefit from skilled therapeutic intervention in order to improve the following deficits and impairments:  Abnormal gait, Decreased activity tolerance, Decreased knowledge of use of DME, Decreased balance, Decreased endurance, Decreased range of motion, Decreased strength, Decreased mobility, Pain, Postural dysfunction, Impaired flexibility  Visit Diagnosis: Muscle weakness (generalized)  Unsteadiness on feet  Other abnormalities of gait and mobility  Chronic bilateral low back pain, unspecified whether sciatica present  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 History of repair of dissecting thoracic aneurysm   . Abdominal distension   . Pulmonary embolus ([place]) 10/19/2019  . CVA (cerebral vascular accident) ([place]) 10/19/2019  . AKI (acute kidney injury) ([place]) 10/19/2019  . 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 Ileus ([place]) 10/19/2019  . 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 Radiculopathy 12/15/2014    Kerrie Pleasure 02/01/2020, 6:35 PM  [facility] [address] [place], [place], [address] Phone: [phone]   Fax:  [phone]  Name: [name] [name] MRN: [id number] Date of Birth: 1946-04-25

## 2020-02-03 ENCOUNTER — Other Ambulatory Visit: Payer: Self-pay

## 2020-02-03 ENCOUNTER — Ambulatory Visit: Payer: Medicare Other

## 2020-02-03 VITALS — BP 152/88 | HR 58

## 2020-02-03 DIAGNOSIS — R2689 Other abnormalities of gait and mobility: Secondary | ICD-10-CM

## 2020-02-03 DIAGNOSIS — R41841 Cognitive communication deficit: Secondary | ICD-10-CM

## 2020-02-03 DIAGNOSIS — M6281 Muscle weakness (generalized): Secondary | ICD-10-CM

## 2020-02-03 NOTE — Patient Instructions (Signed)
° °  Use an alarm in your phone, or another system (maybe a reminder card) to alert you to ask Mrs. Freeney for your medication - if you do the reminder card, place one at the kitchen table, and one maybe in the bathroom, or next to your bed  Bring in a 3-ring binder for your memory notebook - include a calendar, exercises, homework, etc.

## 2020-02-03 NOTE — Patient Instructions (Signed)
Access Code: [email or web address] URL: [email or web address] Date: 02/03/2020 Prepared by: Cherly Anderson  Exercises Sit to Stand - 1 x daily - 7 x weekly - 2 sets - 5 reps Sitting to Supine Roll - 1 x daily - 7 x weekly - 10 reps - 3 sets Supine Single Knee to Chest Stretch - 1 x daily - 7 x weekly - 1 sets - 3 reps - 20-30 sec hold Standing Marching - 2 x daily - 5 x weekly - 1 sets - 10 reps Side Stepping with Counter Support - 2 x daily - 5 x weekly - 1 sets - 4 reps

## 2020-02-04 NOTE — Therapy (Signed)
[facility] [address] [place], [place], [address] Phone: [phone]   Fax:  [phone]  Speech Language Pathology Treatment  Patient Details  Name: [name] [name] MRN: [id number] Date of Birth: [birth year] Referring Provider (SLP): Dr. Letta Pate   Encounter Date: 02/03/2020   End of Session - 02/03/20 2346    Visit Number 2    Number of Visits 17    Date for SLP Re-Evaluation 04/20/20    SLP Start Time 1450    SLP Stop Time  1530    SLP Time Calculation (min) 40 min    Activity Tolerance Patient tolerated treatment well           Past Medical History:  Diagnosis Date  . Chronic back pain    stenosis  . Enlarged prostate   . GERD (gastroesophageal reflux disease)    occasionally will take a zantac(maybe once a month)  . History of colon polyps   . History of kidney stones   . History of stress test    done 10 yrs. ago, as a baseline   . Hyperlipidemia    takes Crestor daily  . Hypertension    takes Amlodipine daily and Lotensin as well  . Stroke [facility])     Past Surgical History:  Procedure Laterality Date  . ANTERIOR LAT LUMBAR FUSION Left 12/15/2014   Procedure: ANTERIOR LATERAL LUMBAR FUSION 1 LEVEL;  Surgeon: Phylliss Bob, MD;  Location: [place];  Service: Orthopedics;  Laterality: Left;  Left sided lateral lumbar interbody fusion, lumbar 3-4, posterior spinal fusion, lumbar 3-4 with instrumentation.  . COLONOSCOPY    . ESOPHAGOGASTRODUODENOSCOPY    . fatty tissue removed from stomach    . LUMBAR LAMINECTOMY/DECOMPRESSION MICRODISCECTOMY N/A 07/08/2013   Procedure: LUMBAR LAMINECTOMY/DECOMPRESSION MICRODISCECTOMY;  Surgeon: Sinclair Ship, MD;  Location: [place];  Service: Orthopedics;  Laterality: N/A;  Lumbar 3-4, lumbar 4-5 decompression  . RADIOLOGY WITH ANESTHESIA N/A 10/22/2019   Procedure: MRI WITH ANESTHESIA OF LUMBAR SPINE WITHOUT CONTRAST;  Surgeon: Radiologist, Medication, MD;   Location: [place];  Service: Radiology;  Laterality: N/A;  . right ankle surgery     as child   . THORACIC AORTIC ENDOVASCULAR STENT GRAFT N/A 10/15/2019   Procedure: THORACIC AORTIC ENDOVASCULAR STENT GRAFT;  Surgeon: Waynetta Sandy, MD;  Location: [place];  Service: Vascular;  Laterality: N/A;  . TONSILLECTOMY     as a child    There were no vitals filed for this visit.   Subjective Assessment - 02/03/20 1452    Subjective "Good afternoon."    Patient is accompained by: Family member   wife                ADULT SLP TREATMENT - 02/04/20 0001      General Information   Behavior/Cognition Alert;Cooperative;Pleasant mood      Cognitive-Linquistic Treatment   Treatment focused on Cognition    Skilled Treatment SLP completed the Cognitive Linguistic Qiuick Test and noted pt with splinter skills in emergent awareness. However SLP noted pt with decr'd planning.organization and decr'd attention to detail in more detailed tasks Radiographer, therapeutic). SLP told pt that he might see this type of difficulty in everyday tasks such as schedule management, missing the current week and thinking he was on the previous week (as an example). Goal added for awareness.  SLP explained need and practical use of a memory planner/binder for pt and SLP suggested pt place a calendar in the binder as  well. SLP also strongly suggested pt begin to tell wife the time for his meds via an alarm or visual reminder.       Assessment / Recommendations / Plan   Plan Continue with current plan of care;Goals updated            SLP Education - 02/03/20 2344    Education Details awareness as deficit, memory/planner binder and need for calendar, tell wife when time for meds    Suder(s) Educated Patient;Spouse    Methods Explanation    Comprehension Verbalized understanding            SLP Short Term Goals - 02/03/20 1505      SLP SHORT TERM GOAL #1   Title pt will demo sustained attention for 10-  minute functional task x 3, in 3 sessions    Time 4    Period Weeks    Status On-going      SLP SHORT TERM GOAL #2   Title pt will demo the knowledge that he can use a memory system for daily events, ID'ing prospective and past events, medication management, and appointment tracking, etc x 3 sessions    Time 4    Period Weeks    Status On-going      SLP SHORT TERM GOAL #3   Title pt will complete standardized cognitive linguistic testing by visit 4    Status Achieved      SLP SHORT TERM GOAL #4   Title pt will exhibit emergent awareness in simple cognitive linguistic tasks    Time 4    Period Weeks    Status New            SLP Long Term Goals - 02/03/20 1504      SLP LONG TERM GOAL #1   Title pt will demo selective attention for 10 minutes to perform simple cognitive linguistic tasks in a min-mod noisy environment x2 sessions    Time 8    Period Weeks    Status On-going      SLP LONG TERM GOAL #2   Title pt will use memory system to record prospective or recall past events with occasional min A x 3 visits    Time 8    Period Weeks    Status On-going      SLP LONG TERM GOAL #3   Title pt will use anomia compensations successfully in 10 minutes simple-mod complex conversation over 2 therapy sessions    Time 8    Period Weeks    Status On-going      SLP LONG TERM GOAL #4   Title pt will exhibit emergent awareness in common min-mod complex household tasks with modified inedpendence in 3 sessions    Time 8    Period Weeks    Status New            Plan - 02/03/20 2346    Clinical Impression Statement [name] [name] is a pleasant 74 year old gentleman who presents with at least moderate cognitive deficits s/p CVA. Standardized testing initiated and to be completed in next 3 visits. Pt had history of cognitive deficits prior to CVA, and was undergoing w/u for dementia prior to hospitalization per MD notes. Prior infarct and white matter changes are noted on an MRI from  2011. 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 He managed finances and medications and helped with household chores prior to CVA, but wife is  managing this now. I recommend skilled ST with emphasis on compensations to improve pt's functional recall and participation in simple household tasks.    Speech Therapy Frequency 2x / week    Duration --   8 weeks or 17 visits   Treatment/Interventions Compensatory strategies;Functional tasks;Patient/family education;Cognitive reorganization;Multimodal communcation approach;SLP instruction and feedback;Internal/external aids;Language facilitation;Compensatory techniques    Potential to Achieve Goals Fair    Potential Considerations Previous level of function    SLP Home Exercise Plan Get planner or binder for pt for memory notebook    Consulted and Agree with Plan of Care Patient;Family member/caregiver           Patient will benefit from skilled therapeutic intervention in order to improve the following deficits and impairments:   Cognitive communication deficit    Problem List Patient Active Problem List   Diagnosis Date Noted  . 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 History of repair of dissecting thoracic aneurysm   . Abdominal distension   . Pulmonary embolus ([place]) 10/19/2019  . CVA (cerebral vascular accident) ([place]) 10/19/2019  . AKI (acute kidney injury) ([place]) 10/19/2019  . 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 Ileus ([place]) 10/19/2019  . 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 Radiculopathy 12/15/2014    [name] [name] ,MS, CCC-SLP  02/04/2020, 12:06 AM  [facility] [address] [place], [place], [address] Phone: [phone]   Fax:   [phone]   Name: [name] [name] MRN: [id number] Date of Birth: 03/27/1946

## 2020-02-04 NOTE — Therapy (Signed)
[facility] [address] [place], [place], [address] Phone: [phone]   Fax:  [phone]  Physical Therapy Treatment  Patient Details  Name: [name] [name] MRN: [id number] Date of Birth: 01-10-1946 Referring Provider (PT): Alysia Penna   Encounter Date: 02/03/2020   PT End of Session - 02/03/20 1541    Visit Number 3    Number of Visits 17    Date for PT Re-Evaluation 10/93/23   90 day cert but 60 day poc   Authorization Type Medicare payer so 10th visit progress note, FOTO    PT Start Time 1540   pt running behind in ST   PT Stop Time 1620    PT Time Calculation (min) 40 min    Equipment Utilized During Treatment Gait belt    Activity Tolerance Patient tolerated treatment well    Behavior During Therapy WFL for tasks assessed/performed           Past Medical History:  Diagnosis Date   Chronic back pain    stenosis   Enlarged prostate    GERD (gastroesophageal reflux disease)    occasionally will take a zantac(maybe once a month)   History of colon polyps    History of kidney stones    History of stress test    done 10 yrs. ago, as a baseline    Hyperlipidemia    takes Crestor daily   Hypertension    takes Amlodipine daily and Lotensin as well   Stroke [facility])     Past Surgical History:  Procedure Laterality Date   ANTERIOR LAT LUMBAR FUSION Left 12/15/2014   Procedure: ANTERIOR LATERAL LUMBAR FUSION 1 LEVEL;  Surgeon: Phylliss Bob, MD;  Location: [facility];  Service: Orthopedics;  Laterality: Left;  Left sided lateral lumbar interbody fusion, lumbar 3-4, posterior spinal fusion, lumbar 3-4 with instrumentation.   COLONOSCOPY     ESOPHAGOGASTRODUODENOSCOPY     fatty tissue removed from stomach     LUMBAR LAMINECTOMY/DECOMPRESSION MICRODISCECTOMY N/A 07/08/2013   Procedure: LUMBAR LAMINECTOMY/DECOMPRESSION MICRODISCECTOMY;  Surgeon: Sinclair Ship, MD;  Location: [facility];  Service:  Orthopedics;  Laterality: N/A;  Lumbar 3-4, lumbar 4-5 decompression   RADIOLOGY WITH ANESTHESIA N/A 10/22/2019   Procedure: MRI WITH ANESTHESIA OF LUMBAR SPINE WITHOUT CONTRAST;  Surgeon: Radiologist, Medication, MD;  Location: [facility];  Service: Radiology;  Laterality: N/A;   right ankle surgery     as child    THORACIC AORTIC ENDOVASCULAR STENT GRAFT N/A 10/15/2019   Procedure: THORACIC AORTIC ENDOVASCULAR STENT GRAFT;  Surgeon: Waynetta Sandy, MD;  Location: [facility];  Service: Vascular;  Laterality: N/A;   TONSILLECTOMY     as a child    Vitals:   02/03/20 1544  BP: (!) 152/88  Pulse: (!) 58         [facility] PT Assessment - 02/03/20 1553      6 Minute Walk- Baseline   6 Minute Walk- Baseline yes    BP (mmHg) 152/88    HR (bpm) 58      6 Minute walk- Post Test   6 Minute Walk Post Test yes    BP (mmHg) 162/82    HR (bpm) 60    Modified Borg Scale for Dyspnea 7- Severe shortness of breath or very hard breathing      6 minute walk test results    Aerobic Endurance Distance Walked 745  [facility] Adult PT Treatment/Exercise - 02/03/20 1553      Transfers   Transfers Sit to Stand;Stand to Sit    Sit to Stand 5: Supervision    Five time sit to stand comments  Sit to stand x 5 from mat with light UE support and cues to lean forward.     Stand to Sit 5: Supervision      Ambulation/Gait   Ambulation/Gait Yes    Ambulation/Gait Assistance 5: Supervision    Ambulation/Gait Assistance Details Pt was cued to try to keep more upright posture. Has slight trunk flexion. Able to keep good reciprocal pace throughout gait. Reported slight pain in low back around 2 min but no change after that.     Ambulation Distance (Feet) 805 Feet    Assistive device Rolling walker    Gait Pattern Step-through pattern    Ambulation Surface Level;Indoor      Self-Care   Self-Care Other Self-Care Comments    Other Self-Care Comments  PT educated patient  on how to protect back if doing dishes by keeping slight bend in knees to prevent overusing back. Also to keep a chair nearby to allow to sit if back starts bothering him.      Neuro Re-ed    Neuro Re-ed Details  Pt ambulated along counter with 1 UE support 10' x 2 then with hand held assist 20' x 2. Pt reported a little pain in low back with less UE support and did note more right lateral trunk lean. Standing march with verbal cues to tighten tummy first x 10 with 1 UE support on counter. Pt reported slight increase in back pain, side stepping along counter 10' x 4 with cues to tighten tummy as well.                   PT Education - 02/04/20 0649    Education Details Issued HEP adding to prior.    [place]omm(s) Educated Patient;Spouse    Methods Explanation;Demonstration;Handout    Comprehension Verbalized understanding            PT Short Term Goals - 02/01/20 1827      PT SHORT TERM GOAL #1   Title Pt will be independent with initial HEP for strength and flexibility and balance.    Time 4    Period Weeks    Status New    Target Date 02/26/20      PT SHORT TERM GOAL #2   Title Pt will be instructed in back safety including proper body mechanics and positioning to better manage back pain and verbalize understanding.    Time 4    Period Weeks    Status New    Target Date 02/26/20      PT SHORT TERM GOAL #3   Title Pt will be able to perform transfers sit to stand from varied surfaces mod I for improved mobility and functional strength.    Time 4    Period Weeks    Status New    Target Date 02/26/20      PT SHORT TERM GOAL #4   Title Pt will ambulate with RW >300' on varied surfaces mod I for improved mobility.    Time 4    Period Weeks    Status New    Target Date 02/26/20      PT SHORT TERM GOAL #5   Title Patient will be able to perform 5x sit to stand without UE support to improve  functional strength    Baseline 0 reps (02/01/20)    Time 4    Period Weeks     Status New    Target Date 02/29/20             PT Long Term Goals - 02/01/20 1825      PT LONG TERM GOAL #1   Title Pt will be independent in progressive HEP for strength, balance and functional mobility to continue gains on own.    Baseline HEP issued 02/01/20    Time 8    Period Weeks    Status New      PT LONG TERM GOAL #2   Title Pt will increase gait speed from 0.91m/s to >0.50m/s for improved community ambulation.    Baseline 0.47m/s on 01/26/20    Time 8    Period Weeks    Status New      PT LONG TERM GOAL #3   Title Pt will decrease TUG from 29 sec to <24 sec for improved balance and functional strength to decrease fall risk.    Baseline 29 sec on 01/26/20    Time 8    Period Weeks    Status New      PT LONG TERM GOAL #4   Title Pt will ambulate >400' on varied surfaces with LRAD versus no AD for short community distances mod I.    Time 8    Period Weeks    Status New      PT LONG TERM GOAL #5   Title Pt will ambulate up/down a flight of stairs with 1 rail right mod I for improved safety in home.    Time 8    Period Weeks    Status New      PT LONG TERM GOAL #6   Title Pt will demo >50/56 on BBS to improve balance and reduce fall risk    Baseline 42/56 (02/01/20)    Time 8    Period Weeks    Status New                 Plan - 02/04/20 0650    Clinical Impression Statement Pt was able to progress gait well with RW. He has smooth, reciprocal steps. Little increase in back pain with completing 6 min walk. Good vital sign response but does have blunted HR response as is on beta blocker. PT started trials at counter with gait with less UE support. Did well from a balance stand point but had a little increase in back pain with less support. Initiated more LE strengthening in standing with focus on TA contraction to support back in more flexion based activities.    Personal Factors and Comorbidities Comorbidity 3+    Comorbidities 74 y.o. right-handed male with  history of chronic back pain with lumbar discectomy 2014 as well as anterior lateral lumbar fusion 2016, hypertension, diabetes mellitus, hyperlipidemia and remote tobacco abuse.    Examination-Activity Limitations Bed Mobility;Transfers;Locomotion Level;Stairs    Examination-Participation Restrictions Community Activity;Driving;Cleaning;Interpersonal Relationship    Stability/Clinical Decision Making Evolving/Moderate complexity    Rehab Potential Good    PT Frequency 2x / week    PT Duration 8 weeks    PT Treatment/Interventions ADLs/Self Care Home Management;DME Instruction;Moist Heat;Cryotherapy;Gait training;Stair training;Functional mobility training;Therapeutic activities;Neuromuscular re-education;Balance training;Therapeutic exercise;Patient/family education;Manual techniques;Passive range of motion;Vestibular    PT Next Visit Plan Monitor vitals with activity. Continue with gait training. Try gait with SPC/no device and see how back does. Pt wanting  to walk outside with wife without walker. Continue with more balance training and LE strengthening with focus on hips and hamstrings in more flexion based activities due to spinal stenosis. No supine SLR due to back issues and aortic dissection.    Consulted and Agree with Plan of Care Patient;Family member/caregiver    Family Member Consulted wife, Barbaraann Share           Patient will benefit from skilled therapeutic intervention in order to improve the following deficits and impairments:  Abnormal gait, Decreased activity tolerance, Decreased knowledge of use of DME, Decreased balance, Decreased endurance, Decreased range of motion, Decreased strength, Decreased mobility, Pain, Postural dysfunction, Impaired flexibility  Visit Diagnosis: Other abnormalities of gait and mobility  Muscle weakness (generalized)     Problem List Patient Active Problem List   Diagnosis Date Noted   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 10/24/2019   History of repair of dissecting thoracic aneurysm    Abdominal distension    Pulmonary embolus ([place]) 10/19/2019   CVA (cerebral vascular accident) ([place]) 10/19/2019   AKI (acute kidney injury) ([place]) 10/19/2019   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 [place], [place], [address] Phone: [phone]   Fax:  [phone]  Name: [name] [name] MRN: [id number] Date of Birth: 11/21/1945

## 2020-02-11 ENCOUNTER — Ambulatory Visit: Payer: Medicare Other

## 2020-02-11 ENCOUNTER — Ambulatory Visit: Payer: Medicare Other | Admitting: Speech Pathology

## 2020-02-11 ENCOUNTER — Other Ambulatory Visit: Payer: Self-pay

## 2020-02-11 VITALS — BP 151/88 | HR 53

## 2020-02-11 DIAGNOSIS — M6281 Muscle weakness (generalized): Secondary | ICD-10-CM

## 2020-02-11 DIAGNOSIS — R2689 Other abnormalities of gait and mobility: Secondary | ICD-10-CM

## 2020-02-11 DIAGNOSIS — R41841 Cognitive communication deficit: Secondary | ICD-10-CM

## 2020-02-11 DIAGNOSIS — R2681 Unsteadiness on feet: Secondary | ICD-10-CM

## 2020-02-11 NOTE — Therapy (Signed)
[facility] [address] [place], [place], [address] Phone: [phone]   Fax:  [phone]  Physical Therapy Treatment  Patient Details  Name: [name] [name] MRN: [id number] Date of Birth: 1945-09-24 Referring Provider (PT): Alysia Penna   Encounter Date: 02/11/2020   PT End of Session - 02/11/20 1242    Visit Number 4    Number of Visits 17    Date for PT Re-Evaluation 64/40/34   90 day cert but 60 day poc   Authorization Type Medicare payer so 10th visit progress note, FOTO    PT Start Time 1231    PT Stop Time 1315    PT Time Calculation (min) 44 min    Equipment Utilized During Treatment Gait belt    Activity Tolerance Patient tolerated treatment well    Behavior During Therapy [facility] for tasks assessed/performed           Past Medical History:  Diagnosis Date  . Chronic back pain    stenosis  . Enlarged prostate   . GERD (gastroesophageal reflux disease)    occasionally will take a zantac(maybe once a month)  . History of colon polyps   . History of kidney stones   . History of stress test    done 10 yrs. ago, as a baseline   . Hyperlipidemia    takes Crestor daily  . Hypertension    takes Amlodipine daily and Lotensin as well  . Stroke [facility])     Past Surgical History:  Procedure Laterality Date  . ANTERIOR LAT LUMBAR FUSION Left 12/15/2014   Procedure: ANTERIOR LATERAL LUMBAR FUSION 1 LEVEL;  Surgeon: Phylliss Bob, MD;  Location: [facility];  Service: Orthopedics;  Laterality: Left;  Left sided lateral lumbar interbody fusion, lumbar 3-4, posterior spinal fusion, lumbar 3-4 with instrumentation.  . COLONOSCOPY    . ESOPHAGOGASTRODUODENOSCOPY    . fatty tissue removed from stomach    . LUMBAR LAMINECTOMY/DECOMPRESSION MICRODISCECTOMY N/A 07/08/2013   Procedure: LUMBAR LAMINECTOMY/DECOMPRESSION MICRODISCECTOMY;  Surgeon: Sinclair Ship, MD;  Location: [facility];  Service: Orthopedics;  Laterality:  N/A;  Lumbar 3-4, lumbar 4-5 decompression  . RADIOLOGY WITH ANESTHESIA N/A 10/22/2019   Procedure: MRI WITH ANESTHESIA OF LUMBAR SPINE WITHOUT CONTRAST;  Surgeon: Radiologist, Medication, MD;  Location: [facility];  Service: Radiology;  Laterality: N/A;  . right ankle surgery     as child   . THORACIC AORTIC ENDOVASCULAR STENT GRAFT N/A 10/15/2019   Procedure: THORACIC AORTIC ENDOVASCULAR STENT GRAFT;  Surgeon: Waynetta Sandy, MD;  Location: [facility];  Service: Vascular;  Laterality: N/A;  . TONSILLECTOMY     as a child    Vitals:   02/11/20 1243  BP: (!) 151/88  Pulse: (!) 53     Subjective Assessment - 02/11/20 1236    Subjective Patient reports that he feels that his back pain is limiting him the most. No falls.    Patient is accompained by: Family member    Pertinent History 74 y.o. right-handed male with history of chronic back pain with lumbar discectomy 2014 as well as anterior lateral lumbar fusion 2016, hypertension, diabetes mellitus, hyperlipidemia and remote tobacco abuse.    Patient Stated Goals Pt would like to be able to have sex.    Currently in Pain? Yes    Pain Score 5     Pain Location Back    Pain Orientation Lower    Pain Descriptors / Indicators Sharp    Pain Type  Chronic pain    Pain Onset More than a month ago                             [facility] Adult PT Treatment/Exercise - 02/11/20 0001      Transfers   Transfers Sit to Stand;Stand to Sit    Sit to Stand 5: Supervision    Stand to Sit 5: Supervision    Comments x 5 reps throughout therapy session with activities      Ambulation/Gait   Ambulation/Gait Yes    Ambulation/Gait Assistance 5: Supervision    Ambulation/Gait Assistance Details Completed extensive gait training with various AD. Initially completed gait training with [place] with quad tip attachment. Patient demo dififculty with proper sequencing therefore slowed gait speed. PT providing verbal cues for proper sequencing for  improved gait pattern. Patient reports increased back pain and decreased stability with SPC at this time. Completed gait training with Rollator. With the patient ambulating with rollator, demo improved gait speed, and overall improved stability. Patient reports that he feels more comfortable with Rollator, and believes he could improve his endurance.     Ambulation Distance (Feet) 920 Feet   250 ft w/ SPC, 670 w/ Rollator   Assistive device Rollator;Straight cane   w/ quad tip attachment   Gait Pattern Step-through pattern    Ambulation Surface Level;Indoor    Gait Comments PT provided education on proper use and brake management of rollator. patient did require verbal cueing for proper use when negotiating to sitting area, requiring verbal cues to ensure bring rollator with him and locks brakes prior to sitting for improved safety.       Self-Care   Self-Care Other Self-Care Comments    Other Self-Care Comments  PT educated on obtaining rollator through insurance. Patient and wife reporting that they ahve a rolllator at home and could potentially have brakes fixed. PT educating on continuing gait training at next session with rollator to improve familiarity with AD prior to purchasing or beginnning to use AD at home.                   PT Education - 02/11/20 1420    Education Details Educated on rollator device, and how to purchase one.    [place](s) Educated Patient;Spouse    Methods Explanation    Comprehension Verbalized understanding            PT Short Term Goals - 02/01/20 1827      PT SHORT TERM GOAL #1   Title Pt will be independent with initial HEP for strength and flexibility and balance.    Time 4    Period Weeks    Status New    Target Date 02/26/20      PT SHORT TERM GOAL #2   Title Pt will be instructed in back safety including proper body mechanics and positioning to better manage back pain and verbalize understanding.    Time 4    Period Weeks    Status New     Target Date 02/26/20      PT SHORT TERM GOAL #3   Title Pt will be able to perform transfers sit to stand from varied surfaces mod I for improved mobility and functional strength.    Time 4    Period Weeks    Status New    Target Date 02/26/20      PT SHORT TERM GOAL #4  Title Pt will ambulate with RW >300' on varied surfaces mod I for improved mobility.    Time 4    Period Weeks    Status New    Target Date 02/26/20      PT SHORT TERM GOAL #5   Title Patient will be able to perform 5x sit to stand without UE support to improve functional strength    Baseline 0 reps (02/01/20)    Time 4    Period Weeks    Status New    Target Date 02/29/20             PT Long Term Goals - 02/01/20 1825      PT LONG TERM GOAL #1   Title Pt will be independent in progressive HEP for strength, balance and functional mobility to continue gains on own.    Baseline HEP issued 02/01/20    Time 8    Period Weeks    Status New      PT LONG TERM GOAL #2   Title Pt will increase gait speed from 0.75m/s to >0.56m/s for improved community ambulation.    Baseline 0.14m/s on 01/26/20    Time 8    Period Weeks    Status New      PT LONG TERM GOAL #3   Title Pt will decrease TUG from 29 sec to <24 sec for improved balance and functional strength to decrease fall risk.    Baseline 29 sec on 01/26/20    Time 8    Period Weeks    Status New      PT LONG TERM GOAL #4   Title Pt will ambulate >400' on varied surfaces with LRAD versus no AD for short community distances mod I.    Time 8    Period Weeks    Status New      PT LONG TERM GOAL #5   Title Pt will ambulate up/down a flight of stairs with 1 rail right mod I for improved safety in home.    Time 8    Period Weeks    Status New      PT LONG TERM GOAL #6   Title Pt will demo >50/56 on BBS to improve balance and reduce fall risk    Baseline 42/56 (02/01/20)    Time 8    Period Weeks    Status New                 Plan -  02/11/20 1431    Clinical Impression Statement Today's skilled PT session focused on extensive gait training and education with various AD. Initially completed with SPC with quad tip attachemnt, but overall slow gait speed due to sequencing difficulty and increased back pain noted. Followed with gait training with rollator, patient overall improved gait speed, improved mobility, and no increase in back pain with AD. Pt reports that he prefers this AD over standard RW. PT provided education on device and obtaining a rollator.    Personal Factors and Comorbidities Comorbidity 3+    Comorbidities 74 y.o. right-handed male with history of chronic back pain with lumbar discectomy 2014 as well as anterior lateral lumbar fusion 2016, hypertension, diabetes mellitus, hyperlipidemia and remote tobacco abuse.    Examination-Activity Limitations Bed Mobility;Transfers;Locomotion Level;Stairs    Examination-Participation Restrictions Community Activity;Driving;Cleaning;Interpersonal Relationship    Stability/Clinical Decision Making Evolving/Moderate complexity    Rehab Potential Good    PT Frequency 2x / week    PT Duration 8  weeks    PT Treatment/Interventions ADLs/Self Care Home Management;DME Instruction;Moist Heat;Cryotherapy;Gait training;Stair training;Functional mobility training;Therapeutic activities;Neuromuscular re-education;Balance training;Therapeutic exercise;Patient/family education;Manual techniques;Passive range of motion;Vestibular    PT Next Visit Plan Monitor vitals with activity. Continue with gait training with rollator, especially outdoor surfaces to see how patient does. Pt wanting to walk outside with wife without walker. Continue with more balance training and LE strengthening with focus on hips and hamstrings in more flexion based activities due to spinal stenosis. No supine SLR due to back issues and aortic dissection.    Consulted and Agree with Plan of Care Patient;Family  member/caregiver    Family Member Consulted wife, Barbaraann Share           Patient will benefit from skilled therapeutic intervention in order to improve the following deficits and impairments:  Abnormal gait, Decreased activity tolerance, Decreased knowledge of use of DME, Decreased balance, Decreased endurance, Decreased range of motion, Decreased strength, Decreased mobility, Pain, Postural dysfunction, Impaired flexibility  Visit Diagnosis: Other abnormalities of gait and mobility  Muscle weakness (generalized)  Unsteadiness on feet     Problem List Patient Active Problem List   Diagnosis Date Noted  . 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 History of repair of dissecting thoracic aneurysm   . Abdominal distension   . Pulmonary embolus ([place]) 10/19/2019  . CVA (cerebral vascular accident) ([place]) 10/19/2019  . AKI (acute kidney injury) ([place]) 10/19/2019  . 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 Ileus ([place]) 10/19/2019  . 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 Radiculopathy 12/15/2014    Jones Bales, PT, DPT 02/11/2020, 2:35 PM  [facility] [address] [place], [place], [address] Phone: [phone]   Fax:  [phone]  Name: [name] [name] MRN: [id number] Date of Birth: [birth year]

## 2020-02-11 NOTE — Therapy (Signed)
[name] [address] [place], [place], [name] Phone: [phone]   Fax:  [phone]  Speech Language Pathology Treatment  Patient Details  Name: [name] [name] MRN: [id number] Date of Birth: 01/30/1946 Referring Provider (SLP): Dr. Letta [name]   Encounter Date: 02/11/2020   End of Session - 02/11/20 1324    Visit Number 3    Number of Visits 17    Date for SLP Re-Evaluation 04/20/20    Authorization Type [employer] Medicare, $35 copay for all 3 disciplines on one day    SLP Start Time 1150    SLP Stop Time  1232    SLP Time Calculation (min) 42 min    Activity Tolerance Patient tolerated treatment well           Past Medical History:  Diagnosis Date   Chronic back pain    stenosis   Enlarged prostate    GERD (gastroesophageal reflux disease)    occasionally will take a zantac(maybe once a month)   History of colon polyps    History of kidney stones    History of stress test    done 10 yrs. ago, as a baseline    Hyperlipidemia    takes Crestor daily   Hypertension    takes Amlodipine daily and Lotensin as well   Stroke [facility])     Past Surgical History:  Procedure Laterality Date   ANTERIOR LAT LUMBAR FUSION Left 12/15/2014   Procedure: ANTERIOR LATERAL LUMBAR FUSION 1 LEVEL;  Surgeon: [name] Bob, MD;  Location: [place];  Service: Orthopedics;  Laterality: Left;  Left sided lateral lumbar interbody fusion, lumbar 3-4, posterior spinal fusion, lumbar 3-4 with instrumentation.   COLONOSCOPY     ESOPHAGOGASTRODUODENOSCOPY     fatty tissue removed from stomach     LUMBAR LAMINECTOMY/DECOMPRESSION MICRODISCECTOMY N/A 07/08/2013   Procedure: LUMBAR LAMINECTOMY/DECOMPRESSION MICRODISCECTOMY;  Surgeon: [name] Ship, MD;  Location: [place];  Service: Orthopedics;  Laterality: N/A;  Lumbar 3-4, lumbar 4-5 decompression   RADIOLOGY WITH ANESTHESIA N/A 10/22/2019   Procedure: MRI WITH ANESTHESIA  OF LUMBAR SPINE WITHOUT CONTRAST;  Surgeon: Radiologist, Medication, MD;  Location: [place];  Service: Radiology;  Laterality: N/A;   right ankle surgery     as child    THORACIC AORTIC ENDOVASCULAR STENT GRAFT N/A 10/15/2019   Procedure: THORACIC AORTIC ENDOVASCULAR STENT GRAFT;  Surgeon: [name] Sandy, MD;  Location: [place];  Service: Vascular;  Laterality: N/A;   TONSILLECTOMY     as a child    There were no vitals filed for this visit.   Subjective Assessment - 02/11/20 1158    Subjective "Which I haven't gotten yet." (when SLP inquired about binder)    Patient is accompained by: --   wife [name] [name]   Currently in Pain? Yes    Pain Score 7     Pain Location Back    Pain Orientation Lower                 ADULT SLP TREATMENT - 02/11/20 1159      General Information   Behavior/Cognition Alert;Cooperative;Pleasant mood      Treatment Provided   Treatment provided Cognitive-Linquistic      Cognitive-Linquistic Treatment   Treatment focused on Cognition    Skilled Treatment Wife reports patient has binder for therapies but they did not bring this today. No calendar/planner yet but they were planning to look for one today. SLP provided printout of pt's therapy calendar  as well as weekly planner worksheets, as wife wanted to try using these with patient. He labeled dates appropriately using his therapy calendar with occasional mod A (cues for error awareness when writing 2/7 dates incorrectly). Pt also recorded today's appointments, upcoming church service, and to-do list item with occasional mod cues. SLP provided handout with tips on calendar use, and advised pt and wife to review schedule together each morning. Encouraged pt to place items in his therapy binder including calendar and bring to next session. Educated on completing cognitive activities daily for 30 minutes. Homework provided.      Assessment / Recommendations / Plan   Plan Continue with current plan of  care;Goals updated      Progression Toward Goals   Progression toward goals Progressing toward goals            SLP Education - 02/11/20 1324    Education Details calendar use, cognitive activities 30 min daily    Foye(s) Educated Patient;Spouse    Methods Explanation;Handout    Comprehension Verbalized understanding;Need further instruction            SLP Short Term Goals - 02/11/20 1325      SLP [place] #1   Title pt will demo sustained attention for 10- minute functional task x 3, in 3 sessions    Time 3    Period Weeks    Status On-going      SLP SHORT TERM GOAL #2   Title pt will demo the knowledge that he can use a memory system for daily events, ID'ing prospective and past events, medication management, and appointment tracking, etc x 3 sessions    Time 3    Period Weeks    Status On-going      SLP SHORT TERM GOAL #3   Title pt will complete standardized cognitive linguistic testing by visit 4    Status Achieved      SLP SHORT TERM GOAL #4   Title pt will exhibit emergent awareness in simple cognitive linguistic tasks    Time 3    Period Weeks    Status New            SLP Long Term Goals - 02/11/20 1325      SLP LONG TERM GOAL #1   Title pt will demo selective attention for 10 minutes to perform simple cognitive linguistic tasks in a min-mod noisy environment x2 sessions    Time 7    Period Weeks    Status On-going      SLP LONG TERM GOAL #2   Title pt will use memory system to record prospective or recall past events with occasional min A x 3 visits    Time 7    Period Weeks    Status On-going      SLP LONG TERM GOAL #3   Title pt will use anomia compensations successfully in 10 minutes simple-mod complex conversation over 2 therapy sessions    Time 7    Period Weeks    Status On-going      SLP LONG TERM GOAL #4   Title pt will exhibit emergent awareness in common min-mod complex household tasks with modified inedpendence in 3  sessions    Time 7    Period Weeks    Status New            Plan - 02/11/20 1324    Clinical Impression Statement [name] [name] is a pleasant 74 year old gentleman  who presents with at least moderate cognitive deficits s/p CVA. Standardized testing initiated and to be completed in next 3 visits. Pt had history of cognitive deficits prior to CVA, and was undergoing w/u for dementia prior to hospitalization per MD notes. Prior infarct and white matter changes are noted on an MRI from 2011. 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 He managed finances and medications and helped with household chores prior to CVA, but wife is managing this now. I recommend skilled ST with emphasis on compensations to improve pt's functional recall and participation in simple household tasks.    Speech Therapy Frequency 2x / week    Duration --   8 weeks or 17 visits   Treatment/Interventions Compensatory strategies;Functional tasks;Patient/family education;Cognitive reorganization;Multimodal communcation approach;SLP instruction and feedback;Internal/external aids;Language facilitation;Compensatory techniques    Potential to Achieve Goals Fair    Potential Considerations Previous level of function    SLP Home Exercise Plan Get planner or binder for pt for memory notebook    Consulted and Agree with Plan of Care Patient;Family member/caregiver           Patient will benefit from skilled therapeutic intervention in order to improve the following deficits and impairments:   Cognitive communication deficit    Problem List Patient Active Problem List   Diagnosis Date Noted   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   History of repair of dissecting thoracic aneurysm    Abdominal distension    Pulmonary embolus ([place]) 10/19/2019   CVA (cerebral vascular accident) ([place]) 10/19/2019   AKI (acute kidney injury) ([place]) 10/19/2019    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

## 2020-02-16 ENCOUNTER — Ambulatory Visit: Payer: Medicare Other

## 2020-02-16 ENCOUNTER — Other Ambulatory Visit: Payer: Self-pay

## 2020-02-16 ENCOUNTER — Ambulatory Visit: Payer: Medicare Other | Admitting: Occupational Therapy

## 2020-02-16 ENCOUNTER — Encounter: Payer: Self-pay | Admitting: Occupational Therapy

## 2020-02-16 VITALS — BP 157/83 | HR 55

## 2020-02-16 DIAGNOSIS — Z8673 Personal history of transient ischemic attack (TIA), and cerebral infarction without residual deficits: Secondary | ICD-10-CM

## 2020-02-16 DIAGNOSIS — R41841 Cognitive communication deficit: Secondary | ICD-10-CM | POA: Diagnosis not present

## 2020-02-16 DIAGNOSIS — G8929 Other chronic pain: Secondary | ICD-10-CM

## 2020-02-16 DIAGNOSIS — M6281 Muscle weakness (generalized): Secondary | ICD-10-CM

## 2020-02-16 DIAGNOSIS — R41842 Visuospatial deficit: Secondary | ICD-10-CM

## 2020-02-16 DIAGNOSIS — R2681 Unsteadiness on feet: Secondary | ICD-10-CM

## 2020-02-16 DIAGNOSIS — R2689 Other abnormalities of gait and mobility: Secondary | ICD-10-CM

## 2020-02-16 DIAGNOSIS — R278 Other lack of coordination: Secondary | ICD-10-CM

## 2020-02-16 DIAGNOSIS — R41840 Attention and concentration deficit: Secondary | ICD-10-CM

## 2020-02-16 NOTE — Therapy (Signed)
[place] [address] [place], [place], [address] Phone: [phone]   Fax:  [phone]  Physical Therapy Treatment  Patient Details  Name: [name] [name] MRN: [id number] Date of Birth: [birth year] Referring Provider (PT): Alysia Penna   Encounter Date: 02/16/2020   PT End of Session - 02/16/20 1449    Visit Number 5    Number of Visits 17    Date for PT Re-Evaluation 08/28/30   90 day cert but 60 day poc   Authorization Type Medicare payer so 10th visit progress note, FOTO    PT Start Time 3557    PT Stop Time 1530    PT Time Calculation (min) 45 min    Equipment Utilized During Treatment Gait belt    Activity Tolerance Patient tolerated treatment well    Behavior During Therapy [facility] for tasks assessed/performed           Past Medical History:  Diagnosis Date  . Chronic back pain    stenosis  . Enlarged prostate   . GERD (gastroesophageal reflux disease)    occasionally will take a zantac(maybe once a month)  . History of colon polyps   . History of kidney stones   . History of stress test    done 10 yrs. ago, as a baseline   . Hyperlipidemia    takes Crestor daily  . Hypertension    takes Amlodipine daily and Lotensin as well  . Stroke [facility])     Past Surgical History:  Procedure Laterality Date  . ANTERIOR LAT LUMBAR FUSION Left 12/15/2014   Procedure: ANTERIOR LATERAL LUMBAR FUSION 1 LEVEL;  Surgeon: Phylliss Bob, MD;  Location: [place];  Service: Orthopedics;  Laterality: Left;  Left sided lateral lumbar interbody fusion, lumbar 3-4, posterior spinal fusion, lumbar 3-4 with instrumentation.  . COLONOSCOPY    . ESOPHAGOGASTRODUODENOSCOPY    . fatty tissue removed from stomach    . LUMBAR LAMINECTOMY/DECOMPRESSION MICRODISCECTOMY N/A 07/08/2013   Procedure: LUMBAR LAMINECTOMY/DECOMPRESSION MICRODISCECTOMY;  Surgeon: Sinclair Ship, MD;  Location: [place];  Service: Orthopedics;  Laterality:  N/A;  Lumbar 3-4, lumbar 4-5 decompression  . RADIOLOGY WITH ANESTHESIA N/A 10/22/2019   Procedure: MRI WITH ANESTHESIA OF LUMBAR SPINE WITHOUT CONTRAST;  Surgeon: Radiologist, Medication, MD;  Location: [place];  Service: Radiology;  Laterality: N/A;  . right ankle surgery     as child   . THORACIC AORTIC ENDOVASCULAR STENT GRAFT N/A 10/15/2019   Procedure: THORACIC AORTIC ENDOVASCULAR STENT GRAFT;  Surgeon: Waynetta Sandy, MD;  Location: [place];  Service: Vascular;  Laterality: N/A;  . TONSILLECTOMY     as a child    Vitals:   02/16/20 1446  BP: (!) 157/83  Pulse: (!) 55     Subjective Assessment - 02/16/20 1446    Subjective Pt reports he is looking forward for the scheduled epidural on 02/23/20. Currently back pain is the same. Pt reports pain is going down bil LE to back of knees, L>R.    Patient is accompained by: Family member    Pertinent History 74 y.o. right-handed male with history of chronic back pain with lumbar discectomy 2014 as well as anterior lateral lumbar fusion 2016, hypertension, diabetes mellitus, hyperlipidemia and remote tobacco abuse.    Patient Stated Goals Pt would like to be able to have sex.    Currently in Pain? Yes    Pain Score 7     Pain Location Back    Pain  Onset More than a month ago               Vitals were measured: BP: 157/83, HR: 55bpm Gait training: no AD, CGA, 210' , pain increased to 8/10, BP 166/84, HR 54 bpm Gait training: 2 walking sticks, CGA, 1 x 310' cues for looking ahead and keeping walking sticks wider to prevent it from kicking.- BP after 165/85, 55 bpm, pain is 7-8/10. Seated swiss ball trunk flexion: fwd and laterally: 5 x 5" holds Gait training: RW, CGA, 1 x 210' cues to stand up tall, pain increased to 8-9/10, BP 170/86, 54bpm                     PT Education - 02/16/20 1504    Education Details Pt and wife edcuatd on keeping a diary of BP for next 2 weeks (1 week before epidural and 1 week  after epidural). Pt to take BP 2x/day. 1 hour after waking up around 10 am and 1x in the evening at 6 pm. Pt to also track his pain levels at both times.            PT Short Term Goals - 02/01/20 1827      PT SHORT TERM GOAL #1   Title Pt will be independent with initial HEP for strength and flexibility and balance.    Time 4    Period Weeks    Status New    Target Date 02/26/20      PT SHORT TERM GOAL #2   Title Pt will be instructed in back safety including proper body mechanics and positioning to better manage back pain and verbalize understanding.    Time 4    Period Weeks    Status New    Target Date 02/26/20      PT SHORT TERM GOAL #3   Title Pt will be able to perform transfers sit to stand from varied surfaces mod I for improved mobility and functional strength.    Time 4    Period Weeks    Status New    Target Date 02/26/20      PT SHORT TERM GOAL #4   Title Pt will ambulate with RW >300' on varied surfaces mod I for improved mobility.    Time 4    Period Weeks    Status New    Target Date 02/26/20      PT SHORT TERM GOAL #5   Title Patient will be able to perform 5x sit to stand without UE support to improve functional strength    Baseline 0 reps (02/01/20)    Time 4    Period Weeks    Status New    Target Date 02/29/20             PT Long Term Goals - 02/01/20 1825      PT LONG TERM GOAL #1   Title Pt will be independent in progressive HEP for strength, balance and functional mobility to continue gains on own.    Baseline HEP issued 02/01/20    Time 8    Period Weeks    Status New      PT LONG TERM GOAL #2   Title Pt will increase gait speed from 0.37m/s to >0.76m/s for improved community ambulation.    Baseline 0.58m/s on 01/26/20    Time 8    Period Weeks    Status New      PT LONG TERM GOAL #  3   Title Pt will decrease TUG from 29 sec to <24 sec for improved balance and functional strength to decrease fall risk.    Baseline 29 sec on 01/26/20      Time 8    Period Weeks    Status New      PT LONG TERM GOAL #4   Title Pt will ambulate >400' on varied surfaces with LRAD versus no AD for short community distances mod I.    Time 8    Period Weeks    Status New      PT LONG TERM GOAL #5   Title Pt will ambulate up/down a flight of stairs with 1 rail right mod I for improved safety in home.    Time 8    Period Weeks    Status New      PT LONG TERM GOAL #6   Title Pt will demo >50/56 on BBS to improve balance and reduce fall risk    Baseline 42/56 (02/01/20)    Time 8    Period Weeks    Status New                 Plan - 02/16/20 1528    Clinical Impression Statement Today's skilled session was focused on gait traning and pain management while vitals were monitored. Patient has higher BP at resting. Flexion biased exercise didn't change patient's pain with walking. Patient's pain incresed from 7/10 to 9/10 at end of the session. Patient and wife were both educated on keeping a log of vitals over next 2 weeks and discuss it with their PCP to see if they can manage his BP better.    Personal Factors and Comorbidities Comorbidity 3+    Comorbidities 74 y.o. right-handed male with history of chronic back pain with lumbar discectomy 2014 as well as anterior lateral lumbar fusion 2016, hypertension, diabetes mellitus, hyperlipidemia and remote tobacco abuse.    Examination-Activity Limitations Bed Mobility;Transfers;Locomotion Level;Stairs    Examination-Participation Restrictions Community Activity;Driving;Cleaning;Interpersonal Relationship    Stability/Clinical Decision Making Evolving/Moderate complexity    Rehab Potential Good    PT Frequency 2x / week    PT Duration 8 weeks    PT Treatment/Interventions ADLs/Self Care Home Management;DME Instruction;Moist Heat;Cryotherapy;Gait training;Stair training;Functional mobility training;Therapeutic activities;Neuromuscular re-education;Balance training;Therapeutic  exercise;Patient/family education;Manual techniques;Passive range of motion;Vestibular    PT Next Visit Plan Monitor vitals with activity. Continue with gait training with rollator, especially outdoor surfaces to see how patient does. Pt wanting to walk outside with wife without walker. Continue with more balance training and LE strengthening with focus on hips and hamstrings in more flexion based activities due to spinal stenosis. No supine SLR due to back issues and aortic dissection.    Consulted and Agree with Plan of Care Patient;Family member/caregiver    Family Member Consulted wife, Barbaraann Share           Patient will benefit from skilled therapeutic intervention in order to improve the following deficits and impairments:  Abnormal gait, Decreased activity tolerance, Decreased knowledge of use of DME, Decreased balance, Decreased endurance, Decreased range of motion, Decreased strength, Decreased mobility, Pain, Postural dysfunction, Impaired flexibility  Visit Diagnosis: Other abnormalities of gait and mobility  Muscle weakness (generalized)  Unsteadiness on feet  Chronic bilateral low back pain, unspecified whether sciatica present  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 Cerebellar cerebrovascular accident (CVA) without  late effect 10/24/2019  . History of repair of dissecting thoracic aneurysm   . Abdominal distension   . Pulmonary embolus ([place]) 10/19/2019  . CVA (cerebral vascular accident) ([place]) 10/19/2019  . AKI (acute kidney injury) ([place]) 10/19/2019  . 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 Ileus ([place]) 10/19/2019  . 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 Radiculopathy 12/15/2014    Kerrie Pleasure 02/16/2020, 3:31 PM  [place] [place] [address] [place], [place], [address] Phone: [phone]   Fax:  [phone]  Name: [name] [name] MRN: [id number] Date of Birth: Aug 04, 1946

## 2020-02-16 NOTE — Therapy (Signed)
[facility] [address] [place], [place], [address] Phone: [phone]   Fax:  [phone]  Speech Language Pathology Treatment  Patient Details  Name: [name] [name] MRN: [id number] Date of Birth: 1946-04-26 Referring Provider (SLP): Dr. Letta [name]   Encounter Date: 02/16/2020   End of Session - 02/16/20 1636    Visit Number 4    Number of Visits 17    Date for SLP Re-Evaluation 04/20/20    SLP Start Time 7654    SLP Stop Time  6503    SLP Time Calculation (min) 42 min    Activity Tolerance Patient tolerated treatment well           Past Medical History:  Diagnosis Date  . Chronic back pain    stenosis  . Enlarged prostate   . GERD (gastroesophageal reflux disease)    occasionally will take a zantac(maybe once a month)  . History of colon polyps   . History of kidney stones   . History of stress test    done 10 yrs. ago, as a baseline   . Hyperlipidemia    takes Crestor daily  . Hypertension    takes Amlodipine daily and Lotensin as well  . Stroke [facility])     Past Surgical History:  Procedure Laterality Date  . ANTERIOR LAT LUMBAR FUSION Left 12/15/2014   Procedure: ANTERIOR LATERAL LUMBAR FUSION 1 LEVEL;  Surgeon: [name] Bob, MD;  Location: [place];  Service: Orthopedics;  Laterality: Left;  Left sided lateral lumbar interbody fusion, lumbar 3-4, posterior spinal fusion, lumbar 3-4 with instrumentation.  . COLONOSCOPY    . ESOPHAGOGASTRODUODENOSCOPY    . fatty tissue removed from stomach    . LUMBAR LAMINECTOMY/DECOMPRESSION MICRODISCECTOMY N/A 07/08/2013   Procedure: LUMBAR LAMINECTOMY/DECOMPRESSION MICRODISCECTOMY;  Surgeon: [name] Ship, MD;  Location: [place];  Service: Orthopedics;  Laterality: N/A;  Lumbar 3-4, lumbar 4-5 decompression  . RADIOLOGY WITH ANESTHESIA N/A 10/22/2019   Procedure: MRI WITH ANESTHESIA OF LUMBAR SPINE WITHOUT CONTRAST;  Surgeon: Radiologist, Medication, MD;   Location: [place];  Service: Radiology;  Laterality: N/A;  . right ankle surgery     as child   . THORACIC AORTIC ENDOVASCULAR STENT GRAFT N/A 10/15/2019   Procedure: THORACIC AORTIC ENDOVASCULAR STENT GRAFT;  Surgeon: [name] Sandy, MD;  Location: [place];  Service: Vascular;  Laterality: N/A;  . TONSILLECTOMY     as a child    There were no vitals filed for this visit.   Subjective Assessment - 02/16/20 1531    Subjective Re: His binder: "Just to keep track of how I'm progressing and assignments and a calendar."    Currently in Pain? Yes    Pain Score 7     Pain Location Back    Pain Orientation Lower    Pain Descriptors / Indicators Aching    Pain Type Chronic pain    Pain Onset More than a month ago    Pain Frequency Constant                 ADULT SLP TREATMENT - 02/16/20 1535      General Information   Behavior/Cognition Alert;Cooperative;Pleasant mood      Treatment Provided   Treatment provided Cognitive-Linquistic      Cognitive-Linquistic Treatment   Treatment focused on Cognition    Skilled Treatment Pt was asked when he began his therapies today adn he answered correctly. SLP asked pt when he returned to clinic and pt  did not know - SLP offered to print pt off a tx schedule and did so - pt had a therapy schedule in the binder but did not remember to look at it. With SLP min-mod questioning cues he correctly told SLP when his first appointment was on 02-18-20 (decr'd attention to detail). Pt put OT exercises behind "OT" tab in binder. SLP asked pt details of previous ST and pt told SLP 3 details. Pt ST homework was blank and weekly planning sheet was blank except for what was filled out last ST session. SLP told pt and husband that SLP wanted pt to write in time to do OT exercises each day (pt's idea), and when he would complete ST homework to be done by 02-18-20. Pt req'd max cues at end of session to recall where OT exercises were placed in binder.        Assessment / Recommendations / Plan   Plan Continue with current plan of care;Goals updated      Progression Toward Goals   Progression toward goals Progressing toward goals            SLP Education - 02/16/20 1634    Education Details use of his binder    [place](s) Educated Patient;Spouse    Methods Explanation    Comprehension Verbalized understanding            SLP Short Term Goals - 02/16/20 1636      SLP SHORT TERM GOAL #1   Title pt will demo sustained attention for 10- minute functional task x 3, in 3 sessions    Baseline 02-16-20    Time 2    Period Weeks    Status On-going      SLP SHORT TERM GOAL #2   Title pt will demo the knowledge that he can use a memory system for daily events, ID'ing prospective and past events, medication management, and appointment tracking, etc x 3 sessions    Time 2    Period Weeks    Status On-going      SLP SHORT TERM GOAL #3   Title pt will complete standardized cognitive linguistic testing by visit 4    Status Achieved      SLP SHORT TERM GOAL #4   Title pt will exhibit emergent awareness in simple cognitive linguistic tasks    Time 2    Period Weeks    Status On-going            SLP Long Term Goals - 02/16/20 1637      SLP LONG TERM GOAL #1   Title pt will demo selective attention for 10 minutes to perform simple cognitive linguistic tasks in a min-mod noisy environment x2 sessions    Time 6    Period Weeks    Status On-going      SLP LONG TERM GOAL #2   Title pt will use memory system to record prospective or recall past events with occasional min A x 3 visits    Time 6    Period Weeks    Status On-going      SLP LONG TERM GOAL #3   Title pt will use anomia compensations successfully in 10 minutes simple-mod complex conversation over 2 therapy sessions    Time 6    Period Weeks    Status On-going      SLP LONG TERM GOAL #4   Title pt will exhibit emergent awareness in common min-mod complex household tasks  with modified inedpendence  in 3 sessions    Time 6    Period Weeks    Status On-going            Plan - 02/16/20 1636    Clinical Impression Statement [name] is a pleasant 74 year old gentleman who presents with at least moderate cognitive deficits s/p CVA. Pt had history of cognitive deficits prior to CVA, and was undergoing w/u for dementia prior to hospitalization per MD notes. Prior infarct and white matter changes are noted on an MRI from 2011. 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 He managed finances and medications and helped with household chores prior to CVA, but wife is managing this now. I recommend skilled ST with emphasis on compensations to improve pt's functional recall and participation in simple household tasks.    Speech Therapy Frequency 2x / week    Duration --   8 weeks or 17 visits   Treatment/Interventions Compensatory strategies;Functional tasks;Patient/family education;Cognitive reorganization;Multimodal communcation approach;SLP instruction and feedback;Internal/external aids;Language facilitation;Compensatory techniques    Potential to Achieve Goals Fair    Potential Considerations Previous level of function    SLP Home Exercise Plan Get planner or binder for pt for memory notebook    Consulted and Agree with Plan of Care Patient;Family member/caregiver           Patient will benefit from skilled therapeutic intervention in order to improve the following deficits and impairments:   Cognitive communication deficit    Problem List Patient Active Problem List   Diagnosis Date Noted  . 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 History of repair of dissecting thoracic aneurysm   . Abdominal distension   . Pulmonary embolus ([place]) 10/19/2019  . CVA (cerebral vascular accident) ([place]) 10/19/2019  . AKI (acute kidney injury) ([place]) 10/19/2019  . 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 Ileus ([place]) 10/19/2019  . 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 Radiculopathy 12/15/2014    [facility] ,[place], CCC-SLP  02/16/2020, 4:38 PM  [facility] [address] [place], [place], [address] Phone: [phone]   Fax:  [phone]   Name: [name] [name] MRN: [id number] Date of Birth: 05/27/1946

## 2020-02-16 NOTE — Therapy (Signed)
[facility] [address] [place], [place], [address] Phone: [phone]   Fax:  [phone]  Occupational Therapy Treatment  Patient Details  Name: [name] [name] MRN: [id number] Date of Birth: 04/07/1946 No data recorded  Encounter Date: 02/16/2020   OT End of Session - 02/16/20 1900    Visit Number 2    Number of Visits 9    Date for OT Re-Evaluation 03/11/20    Authorization Type UHC Medicare    OT Start Time 1400    OT Stop Time 1445    OT Time Calculation (min) 45 min    Activity Tolerance Patient tolerated treatment well    Behavior During Therapy [facility] for tasks assessed/performed           Past Medical History:  Diagnosis Date  . Chronic back pain    stenosis  . Enlarged prostate   . GERD (gastroesophageal reflux disease)    occasionally will take a zantac(maybe once a month)  . History of colon polyps   . History of kidney stones   . History of stress test    done 10 yrs. ago, as a baseline   . Hyperlipidemia    takes Crestor daily  . Hypertension    takes Amlodipine daily and Lotensin as well  . Stroke [facility])     Past Surgical History:  Procedure Laterality Date  . ANTERIOR LAT LUMBAR FUSION Left 12/15/2014   Procedure: ANTERIOR LATERAL LUMBAR FUSION 1 LEVEL;  Surgeon: Phylliss Bob, MD;  Location: [place];  Service: Orthopedics;  Laterality: Left;  Left sided lateral lumbar interbody fusion, lumbar 3-4, posterior spinal fusion, lumbar 3-4 with instrumentation.  . COLONOSCOPY    . ESOPHAGOGASTRODUODENOSCOPY    . fatty tissue removed from stomach    . LUMBAR LAMINECTOMY/DECOMPRESSION MICRODISCECTOMY N/A 07/08/2013   Procedure: LUMBAR LAMINECTOMY/DECOMPRESSION MICRODISCECTOMY;  Surgeon: Sinclair Ship, MD;  Location: [place];  Service: Orthopedics;  Laterality: N/A;  Lumbar 3-4, lumbar 4-5 decompression  . RADIOLOGY WITH ANESTHESIA N/A 10/22/2019   Procedure: MRI WITH ANESTHESIA OF LUMBAR SPINE  WITHOUT CONTRAST;  Surgeon: Radiologist, Medication, MD;  Location: [place];  Service: Radiology;  Laterality: N/A;  . right ankle surgery     as child   . THORACIC AORTIC ENDOVASCULAR STENT GRAFT N/A 10/15/2019   Procedure: THORACIC AORTIC ENDOVASCULAR STENT GRAFT;  Surgeon: Waynetta Sandy, MD;  Location: [place];  Service: Vascular;  Laterality: N/A;  . TONSILLECTOMY     as a child    There were no vitals filed for this visit.   Subjective Assessment - 02/16/20 1407    Subjective  I am working on laterality (balance)    Currently in Pain? Yes    Pain Score 7     Pain Location Back    Pain Orientation Lower    Pain Descriptors / Indicators Aching    Pain Type Chronic pain                        OT Treatments/Exercises (OP) - 02/16/20 0001      ADLs   Driving Discussed that we would need further assessment as related to driving, and that time may be helpful to allow for brain healing.  Patient does not show significant physical deficits that would preclude him from driving, but will consult with SLP and do further assessment of insight, awarenss, attention, reaction time as related to operating a motor vehicle.  ADL Comments Reviewed OT goals with patient and wife. Patient very eager to discuss returning to driving      Neurological Re-education Exercises   Other Exercises 1 Established a home exercise program designed to improve left hand coordination.  See patient instructions.                    OT Education - 02/16/20 1900    Education Details OT goals, coordination HEP, potential driving range of recommendations    [place](s) Educated Patient;Spouse    Methods Explanation;Demonstration;Handout    Comprehension Need further instruction;Verbalized understanding               OT Long Term Goals - 02/16/20 1902      OT LONG TERM GOAL #1   Title Patient will complete an HEP to improve strength and coordination in non dominant LUE due  03/11/20    Status On-going      OT LONG TERM GOAL #2   Title Patient will complete 9 hole peg test with no less than 5 second difference betweeen right (dominant) and left hand to aide with manipulation of small objects.    Status On-going      OT LONG TERM GOAL #3   Title Patient and his wife will demonstrate understanding of driving recommendations    Status On-going      OT LONG TERM GOAL #4   Title Patient will return to housekeeping - sweeping, dusting, vaccuuming without loss of balance, and with pain no greater than 6/10 back    Status On-going                 Plan - 02/16/20 1901    Clinical Impression Statement Patient and wife are agreeable to OT program and goals.  Patient continues to walk with walker in clinic, but wife reports he rarely uses at home.  Patient has long standing back pain issues, and is scheduled for epidural injection next week.    OT Frequency 2x / week    OT Duration 4 weeks    OT Treatment/Interventions Self-care/ADL training;Therapeutic exercise;Visual/perceptual remediation/compensation;Patient/family education;Neuromuscular education;Moist Heat;Functional Furniture conservator/restorer;Therapeutic activities;Balance training;Manual Therapy;DME and/or AE instruction;Cryotherapy;Aquatic Therapy    Plan Begin HEP - hand strengthening - left, functional mobility in home like environment    Consulted and Agree with Plan of Care Patient;Family member/caregiver    Family Member Consulted wife - "weezy"           Patient will benefit from skilled therapeutic intervention in order to improve the following deficits and impairments:           Visit Diagnosis: Muscle weakness (generalized)  Unsteadiness on feet  Attention and concentration deficit  Visuospatial deficit  Other lack of coordination    Problem List Patient Active Problem List   Diagnosis Date Noted  . 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  History of repair of dissecting thoracic aneurysm   . Abdominal distension   . Pulmonary embolus ([place]) 10/19/2019  . CVA (cerebral vascular accident) ([place]) 10/19/2019  . AKI (acute kidney injury) ([place]) 10/19/2019  . 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 Ileus ([place]) 10/19/2019  . 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 Radiculopathy 12/15/2014    Mariah Milling, OTR/L 02/16/2020, 7:03 PM  [facility] [address] [place], [place],  [address] Phone: [phone]   Fax:  [phone]  Name: [name] [name] MRN: [id number] Date of Birth: 01/18/1946

## 2020-02-16 NOTE — Patient Instructions (Signed)
  Write in a time to complete your OT exercises each day, and when to complete your speech homework due next time (02-18-20).

## 2020-02-16 NOTE — Patient Instructions (Signed)
  Coordination Activities  Perform the following activities for 5-10  minutes 1-2  times per day with both hand(s).   Rotate ball in fingertips (clockwise and counter-clockwise).  Flip cards 1 at a time as fast as you can.  Deal cards with your thumb (Hold deck in hand and push card off top with thumb).  Rotate card in hand (clockwise and counter-clockwise).  Shuffle cards.  Pick up coins and place in container or coin bank.  Pick up coins and stack.  Pick up coins one at a time until you get 5-10 in your hand, then move coins from palm to fingertips to stack one at a time.  play piano

## 2020-02-18 ENCOUNTER — Ambulatory Visit: Payer: Medicare Other | Admitting: Occupational Therapy

## 2020-02-18 ENCOUNTER — Ambulatory Visit: Payer: Medicare Other | Attending: Internal Medicine | Admitting: Speech Pathology

## 2020-02-18 ENCOUNTER — Ambulatory Visit: Payer: Medicare Other

## 2020-02-18 ENCOUNTER — Other Ambulatory Visit: Payer: Self-pay

## 2020-02-18 DIAGNOSIS — R41842 Visuospatial deficit: Secondary | ICD-10-CM | POA: Insufficient documentation

## 2020-02-18 DIAGNOSIS — G8929 Other chronic pain: Secondary | ICD-10-CM

## 2020-02-18 DIAGNOSIS — R2681 Unsteadiness on feet: Secondary | ICD-10-CM | POA: Diagnosis present

## 2020-02-18 DIAGNOSIS — M545 Low back pain: Secondary | ICD-10-CM | POA: Diagnosis present

## 2020-02-18 DIAGNOSIS — M6281 Muscle weakness (generalized): Secondary | ICD-10-CM | POA: Diagnosis present

## 2020-02-18 DIAGNOSIS — R41840 Attention and concentration deficit: Secondary | ICD-10-CM | POA: Insufficient documentation

## 2020-02-18 DIAGNOSIS — R278 Other lack of coordination: Secondary | ICD-10-CM | POA: Diagnosis present

## 2020-02-18 DIAGNOSIS — R2689 Other abnormalities of gait and mobility: Secondary | ICD-10-CM | POA: Diagnosis present

## 2020-02-18 DIAGNOSIS — R41841 Cognitive communication deficit: Secondary | ICD-10-CM | POA: Diagnosis present

## 2020-02-18 DIAGNOSIS — Z8673 Personal history of transient ischemic attack (TIA), and cerebral infarction without residual deficits: Secondary | ICD-10-CM | POA: Insufficient documentation

## 2020-02-18 NOTE — Therapy (Signed)
[facility] [address] [place], [place], [address] Phone: [phone]   Fax:  [phone]  Occupational Therapy Treatment  Patient Details  Name: [name] [name] MRN: [id number] Date of Birth: 1946-06-11 No data recorded  Encounter Date: 02/18/2020   OT End of Session - 02/18/20 1743    Visit Number 3    Number of Visits 9    Date for OT Re-Evaluation 03/11/20    Authorization Type UHC Medicare    Progress Note Due on Visit 10    OT Start Time 1530    OT Stop Time 1612    OT Time Calculation (min) 42 min    Activity Tolerance Patient tolerated treatment well    Behavior During Therapy [facility] for tasks assessed/performed           Past Medical History:  Diagnosis Date  . Chronic back pain    stenosis  . Enlarged prostate   . GERD (gastroesophageal reflux disease)    occasionally will take a zantac(maybe once a month)  . History of colon polyps   . History of kidney stones   . History of stress test    done 10 yrs. ago, as a baseline   . Hyperlipidemia    takes Crestor daily  . Hypertension    takes Amlodipine daily and Lotensin as well  . Stroke [facility])     Past Surgical History:  Procedure Laterality Date  . ANTERIOR LAT LUMBAR FUSION Left 12/15/2014   Procedure: ANTERIOR LATERAL LUMBAR FUSION 1 LEVEL;  Surgeon: Phylliss Bob, MD;  Location: [place];  Service: Orthopedics;  Laterality: Left;  Left sided lateral lumbar interbody fusion, lumbar 3-4, posterior spinal fusion, lumbar 3-4 with instrumentation.  . COLONOSCOPY    . ESOPHAGOGASTRODUODENOSCOPY    . fatty tissue removed from stomach    . LUMBAR LAMINECTOMY/DECOMPRESSION MICRODISCECTOMY N/A 07/08/2013   Procedure: LUMBAR LAMINECTOMY/DECOMPRESSION MICRODISCECTOMY;  Surgeon: Sinclair Ship, MD;  Location: [place];  Service: Orthopedics;  Laterality: N/A;  Lumbar 3-4, lumbar 4-5 decompression  . RADIOLOGY WITH ANESTHESIA N/A 10/22/2019   Procedure: MRI  WITH ANESTHESIA OF LUMBAR SPINE WITHOUT CONTRAST;  Surgeon: Radiologist, Medication, MD;  Location: [place];  Service: Radiology;  Laterality: N/A;  . right ankle surgery     as child   . THORACIC AORTIC ENDOVASCULAR STENT GRAFT N/A 10/15/2019   Procedure: THORACIC AORTIC ENDOVASCULAR STENT GRAFT;  Surgeon: Waynetta Sandy, MD;  Location: [place];  Service: Vascular;  Laterality: N/A;  . TONSILLECTOMY     as a child    There were no vitals filed for this visit.   Subjective Assessment - 02/18/20 1536    Subjective  I don't remember if I worked on these are not (re: coordination HEP)    Patient is accompanied by: Family member   wife   Pertinent History 10/08/19 aortic dissection w/ cerebellar stroke following sx. Back surgery 2014    Limitations Blood pressure    Currently in Pain? Yes    Pain Score 7     Pain Location Back    Pain Orientation Lower    Pain Descriptors / Indicators Aching    Pain Type Chronic pain    Pain Onset More than a month ago    Pain Frequency Constant    Aggravating Factors  movement, laying down    Pain Relieving Factors heat, tylenol              [facility] OT Assessment -  02/18/20 0001      Coordination   9 Hole Peg Test Right;Left    Right 9 Hole Peg Test 29.91 sec    Left 9 Hole Peg Test 33.82 sec           Assessed coordination - see above. Reviewed coordination HEP d/t memory deficits and not doing since last session. Issued putty HEP for Lt hand and reviewed. Issued green resistance putty.  Discussed memory strategies w/ medication management including routine, use of pill box, keeping pills where he will take them (in line of sight), associating with breakfast time and bedtime routine, etc.                  OT Education - 02/18/20 1608    Education Details review of coordination HEP, Putty HEP for Lt hand    [place](s) Educated Patient;Spouse    Methods Explanation;Demonstration;Handout;Verbal cues    Comprehension Need  further instruction;Verbalized understanding;Verbal cues required;Returned demonstration               OT Long Term Goals - 02/18/20 1610      OT LONG TERM GOAL #1   Title Patient will complete an HEP to improve strength and coordination in non dominant LUE due 03/11/20    Status On-going      OT LONG TERM GOAL #2   Title Patient will complete 9 hole peg test with no less than 5 second difference betweeen right (dominant) and left hand to aide with manipulation of small objects.    Status Achieved   Rt = 29.91 sec,  Lt = 33.82 sec     OT LONG TERM GOAL #3   Title Patient and his wife will demonstrate understanding of driving recommendations    Status On-going      OT LONG TERM GOAL #4   Title Patient will return to housekeeping - sweeping, dusting, vaccuuming without loss of balance, and with pain no greater than 6/10 back    Status On-going                 Plan - 02/18/20 1740    Clinical Impression Statement Pt with decreased memory which is inhibiting carryover of HEP at home - pt will need review/reinforcement and wife will need to help with carryover    Occupational performance deficits (Please refer to evaluation for details): ADL's;IADL's;Leisure    Body Structure / Function / Physical Skills ADL;Coordination;Endurance;GMC;Muscle spasms;UE functional use;Vestibular;Decreased knowledge of precautions;Balance;Body mechanics;Decreased knowledge of use of DME;Flexibility;IADL;Pain;Vision;Strength;FMC;Dexterity    Cognitive Skills Attention;Safety Awareness;Problem Solve;Thought;Sequencing    Rehab Potential Good    Comorbidities impacting occupational performance description: HTN, BPH, Prior back surgeries    OT Frequency 2x / week    OT Duration 4 weeks    OT Treatment/Interventions Self-care/ADL training;Therapeutic exercise;Visual/perceptual remediation/compensation;Patient/family education;Neuromuscular education;Moist Heat;Functional Furniture conservator/restorer;Therapeutic  activities;Balance training;Manual Therapy;DME and/or AE instruction;Cryotherapy;Aquatic Therapy    Plan functional mobility, memory strategies    Consulted and Agree with Plan of Care Patient;Family member/caregiver    Family Member Consulted wife - "weezy"           Patient will benefit from skilled therapeutic intervention in order to improve the following deficits and impairments:   Body Structure / Function / Physical Skills: ADL, Coordination, Endurance, GMC, Muscle spasms, UE functional use, Vestibular, Decreased knowledge of precautions, Balance, Body mechanics, Decreased knowledge of use of DME, Flexibility, IADL, Pain, Vision, Strength, FMC, Dexterity Cognitive Skills: Attention, Safety Awareness, Problem Solve, Thought, Sequencing  Visit Diagnosis: Other lack of coordination  Muscle weakness (generalized)  Attention and concentration deficit    Problem List Patient Active Problem List   Diagnosis Date Noted  . 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 History of repair of dissecting thoracic aneurysm   . Abdominal distension   . Pulmonary embolus ([place]) 10/19/2019  . CVA (cerebral vascular accident) ([place]) 10/19/2019  . AKI (acute kidney injury) ([place]) 10/19/2019  . 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 Ileus ([place]) 10/19/2019  . 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 Radiculopathy 12/15/2014    Carey Bullocks, OTR/L 02/18/2020, 5:44 PM  [facility] [address] [place], [place], [address] Phone: [phone]   Fax:  [phone]  Name: [name] [name] MRN: [id number] Date of Birth: 11-07-1945

## 2020-02-18 NOTE — Therapy (Signed)
[facility] [address] [place], [place], [address] Phone: [phone]   Fax:  [phone]  Physical Therapy Treatment  Patient Details  Name: [name] [name] MRN: [id number] Date of Birth: [birth year] Referring Provider (PT): Alysia Penna   Encounter Date: 02/18/2020   PT End of Session - 02/18/20 1839    Visit Number 6    Number of Visits 17    Date for PT Re-Evaluation 04/26/20    Authorization Type Medicare payer so 10th visit progress note, FOTO    PT Start Time 1615    PT Stop Time 1700    PT Time Calculation (min) 45 min    Equipment Utilized During Treatment Gait belt    Activity Tolerance Patient tolerated treatment well    Behavior During Therapy [facility] for tasks assessed/performed           Past Medical History:  Diagnosis Date  . Chronic back pain    stenosis  . Enlarged prostate   . GERD (gastroesophageal reflux disease)    occasionally will take a zantac(maybe once a month)  . History of colon polyps   . History of kidney stones   . History of stress test    done 10 yrs. ago, as a baseline   . Hyperlipidemia    takes Crestor daily  . Hypertension    takes Amlodipine daily and Lotensin as well  . Stroke [facility])     Past Surgical History:  Procedure Laterality Date  . ANTERIOR LAT LUMBAR FUSION Left 12/15/2014   Procedure: ANTERIOR LATERAL LUMBAR FUSION 1 LEVEL;  Surgeon: Phylliss Bob, MD;  Location: [facility];  Service: Orthopedics;  Laterality: Left;  Left sided lateral lumbar interbody fusion, lumbar 3-4, posterior spinal fusion, lumbar 3-4 with instrumentation.  . COLONOSCOPY    . ESOPHAGOGASTRODUODENOSCOPY    . fatty tissue removed from stomach    . LUMBAR LAMINECTOMY/DECOMPRESSION MICRODISCECTOMY N/A 07/08/2013   Procedure: LUMBAR LAMINECTOMY/DECOMPRESSION MICRODISCECTOMY;  Surgeon: Sinclair Ship, MD;  Location: [place];  Service: Orthopedics;  Laterality: N/A;  Lumbar 3-4, lumbar 4-5  decompression  . RADIOLOGY WITH ANESTHESIA N/A 10/22/2019   Procedure: MRI WITH ANESTHESIA OF LUMBAR SPINE WITHOUT CONTRAST;  Surgeon: Radiologist, Medication, MD;  Location: [place];  Service: Radiology;  Laterality: N/A;  . right ankle surgery     as child   . THORACIC AORTIC ENDOVASCULAR STENT GRAFT N/A 10/15/2019   Procedure: THORACIC AORTIC ENDOVASCULAR STENT GRAFT;  Surgeon: Waynetta Sandy, MD;  Location: [place];  Service: Vascular;  Laterality: N/A;  . TONSILLECTOMY     as a child    There were no vitals filed for this visit.   Subjective Assessment - 02/18/20 1622    Subjective Pt reports low back pain is 7/10.                     Therapeutic activity: Sleep posture education and demonstration Pt educated on sleeping on side with cues to not directly lay on his shoulder but have slight posterior lean to keep WB through lateral rib cage, cued to keep hips flexed and knees and feet together to improve slight lumbar flexion and avoid twist in lumbar spine. Pt cued to not hug a pillow in front as he will be laying on his shoulder directly and twisting his spine. When lying on his back, pt educated to have 2-3 pillows under his knees and adequate pillows under his head to avoid strain on his  spine. Pain decreased from 7/10 to 6/10 with proper positioning  Pt educated to lay down in middle of the day for 30 min to help decompress spine  Gait training: RW, SBA, 1050', pain increased from 6/10 to 8/10 after 420'                  PT Short Term Goals - 02/01/20 1827      PT SHORT TERM GOAL #1   Title Pt will be independent with initial HEP for strength and flexibility and balance.    Time 4    Period Weeks    Status New    Target Date 02/26/20      PT SHORT TERM GOAL #2   Title Pt will be instructed in back safety including proper body mechanics and positioning to better manage back pain and verbalize understanding.    Time 4    Period Weeks     Status New    Target Date 02/26/20      PT SHORT TERM GOAL #3   Title Pt will be able to perform transfers sit to stand from varied surfaces mod I for improved mobility and functional strength.    Time 4    Period Weeks    Status New    Target Date 02/26/20      PT SHORT TERM GOAL #4   Title Pt will ambulate with RW >300' on varied surfaces mod I for improved mobility.    Time 4    Period Weeks    Status New    Target Date 02/26/20      PT SHORT TERM GOAL #5   Title Patient will be able to perform 5x sit to stand without UE support to improve functional strength    Baseline 0 reps (02/01/20)    Time 4    Period Weeks    Status New    Target Date 02/29/20             PT Long Term Goals - 02/01/20 1825      PT LONG TERM GOAL #1   Title Pt will be independent in progressive HEP for strength, balance and functional mobility to continue gains on own.    Baseline HEP issued 02/01/20    Time 8    Period Weeks    Status New      PT LONG TERM GOAL #2   Title Pt will increase gait speed from 0.70m/s to >0.49m/s for improved community ambulation.    Baseline 0.16m/s on 01/26/20    Time 8    Period Weeks    Status New      PT LONG TERM GOAL #3   Title Pt will decrease TUG from 29 sec to <24 sec for improved balance and functional strength to decrease fall risk.    Baseline 29 sec on 01/26/20    Time 8    Period Weeks    Status New      PT LONG TERM GOAL #4   Title Pt will ambulate >400' on varied surfaces with LRAD versus no AD for short community distances mod I.    Time 8    Period Weeks    Status New      PT LONG TERM GOAL #5   Title Pt will ambulate up/down a flight of stairs with 1 rail right mod I for improved safety in home.    Time 8    Period Weeks    Status New  PT LONG TERM GOAL #6   Title Pt will demo >50/56 on BBS to improve balance and reduce fall risk    Baseline 42/56 (02/01/20)    Time 8    Period Weeks    Status New                  Plan - 02/18/20 1837    Clinical Impression Statement Today's session was focused on teaching patient on proper positioning to improve pain with sleeping. Teching postural corrections and decompression strategies by lying down in middle of the day to decompress spine to help manage pain better. Patient reported pain increase from 6/10 to 8/10 after 420' of walking with RW.    Personal Factors and Comorbidities Comorbidity 3+    Comorbidities 74 y.o. right-handed male with history of chronic back pain with lumbar discectomy 2014 as well as anterior lateral lumbar fusion 2016, hypertension, diabetes mellitus, hyperlipidemia and remote tobacco abuse.    Examination-Activity Limitations Bed Mobility;Transfers;Locomotion Level;Stairs    Examination-Participation Restrictions Community Activity;Driving;Cleaning;Interpersonal Relationship    Stability/Clinical Decision Making Evolving/Moderate complexity    Rehab Potential Good    PT Frequency 2x / week    PT Duration 8 weeks    PT Treatment/Interventions ADLs/Self Care Home Management;DME Instruction;Moist Heat;Cryotherapy;Gait training;Stair training;Functional mobility training;Therapeutic activities;Neuromuscular re-education;Balance training;Therapeutic exercise;Patient/family education;Manual techniques;Passive range of motion;Vestibular    PT Next Visit Plan Monitor vitals with activity. Continue with gait training with rollator, especially outdoor surfaces to see how patient does. Pt wanting to walk outside with wife without walker. Continue with more balance training and LE strengthening with focus on hips and hamstrings in more flexion based activities due to spinal stenosis. No supine SLR due to back issues and aortic dissection.    Consulted and Agree with Plan of Care Patient;Family member/caregiver    Family Member Consulted wife, Barbaraann Share           Patient will benefit from skilled therapeutic intervention in order to improve the following  deficits and impairments:  Abnormal gait, Decreased activity tolerance, Decreased knowledge of use of DME, Decreased balance, Decreased endurance, Decreased range of motion, Decreased strength, Decreased mobility, Pain, Postural dysfunction, Impaired flexibility  Visit Diagnosis: Muscle weakness (generalized)  Other abnormalities of gait and mobility  Unsteadiness on feet  Chronic bilateral low back pain, unspecified whether sciatica present     Problem List Patient Active Problem List   Diagnosis Date Noted  . 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 History of repair of dissecting thoracic aneurysm   . Abdominal distension   . Pulmonary embolus ([place]) 10/19/2019  . CVA (cerebral vascular accident) ([place]) 10/19/2019  . AKI (acute kidney injury) ([place]) 10/19/2019  . 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 Ileus ([place]) 10/19/2019  . 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 Radiculopathy 12/15/2014    [name] [name] 02/18/2020, 6:40 PM  [facility] [address] [place], [place], [address] Phone: [phone]   Fax:  [phone]  Name: [name] [name] MRN: [id number] Date of Birth: [birth year]

## 2020-02-18 NOTE — Patient Instructions (Signed)
  1. Grip Strengthening (Resistive Putty)   Squeeze putty using thumb and all fingers with Lt hand. Repeat _20___ times. Do __2__ sessions per day.   2. Roll putty into tube on table and pinch between first two fingers and thumb of Lt hand x 10 reps each. Do 2 sessions per day

## 2020-02-19 NOTE — Therapy (Signed)
[facility] [address] [place], [place], [address] Phone: [phone]   Fax:  [phone]  Speech Language Pathology Treatment  Patient Details  Name: [name] [name] MRN: [id number] Date of Birth: 12/13/1945 Referring Provider (SLP): Dr. Letta Pate   Encounter Date: 02/18/2020   End of Session - 02/19/20 1650    Visit Number 5    Number of Visits 17    Date for SLP Re-Evaluation 04/20/20    Authorization Type [employer] Medicare, $35 copay for all 3 disciplines on one day    SLP Start Time 1703    SLP Stop Time  1745    SLP Time Calculation (min) 42 min    Activity Tolerance Patient tolerated treatment well           Past Medical History:  Diagnosis Date   Chronic back pain    stenosis   Enlarged prostate    GERD (gastroesophageal reflux disease)    occasionally will take a zantac(maybe once a month)   History of colon polyps    History of kidney stones    History of stress test    done 10 yrs. ago, as a baseline    Hyperlipidemia    takes Crestor daily   Hypertension    takes Amlodipine daily and Lotensin as well   Stroke [facility])     Past Surgical History:  Procedure Laterality Date   ANTERIOR LAT LUMBAR FUSION Left 12/15/2014   Procedure: ANTERIOR LATERAL LUMBAR FUSION 1 LEVEL;  Surgeon: Phylliss Bob, MD;  Location: [facility];  Service: Orthopedics;  Laterality: Left;  Left sided lateral lumbar interbody fusion, lumbar 3-4, posterior spinal fusion, lumbar 3-4 with instrumentation.   COLONOSCOPY     ESOPHAGOGASTRODUODENOSCOPY     fatty tissue removed from stomach     LUMBAR LAMINECTOMY/DECOMPRESSION MICRODISCECTOMY N/A 07/08/2013   Procedure: LUMBAR LAMINECTOMY/DECOMPRESSION MICRODISCECTOMY;  Surgeon: Sinclair Ship, MD;  Location: [facility];  Service: Orthopedics;  Laterality: N/A;  Lumbar 3-4, lumbar 4-5 decompression   RADIOLOGY WITH ANESTHESIA N/A 10/22/2019   Procedure: MRI WITH ANESTHESIA OF  LUMBAR SPINE WITHOUT CONTRAST;  Surgeon: Radiologist, Medication, MD;  Location: [facility];  Service: Radiology;  Laterality: N/A;   right ankle surgery     as child    THORACIC AORTIC ENDOVASCULAR STENT GRAFT N/A 10/15/2019   Procedure: THORACIC AORTIC ENDOVASCULAR STENT GRAFT;  Surgeon: Waynetta Sandy, MD;  Location: [facility];  Service: Vascular;  Laterality: N/A;   TONSILLECTOMY     as a child    There were no vitals filed for this visit.   Subjective Assessment - 02/19/20 1018    Subjective "This one is your section," wife directs pt to ST section of binder when he looked at weekly planner.    Patient is accompained by: Family member   wife   Currently in Pain? Yes    Pain Score 7     Pain Location Back    Pain Orientation Lower                 ADULT SLP TREATMENT - 02/19/20 1642      General Information   Behavior/Cognition Alert;Cooperative;Pleasant mood;Requires cueing      Treatment Provided   Treatment provided Cognitive-Linquistic      Pain Assessment   Pain Assessment No/denies pain      Cognitive-Linquistic Treatment   Treatment focused on Cognition    Skilled Treatment Wife reports patient was "confused about why he had to  do the homework," re: simple reasoning task provided last session. SLP explained purpose of homework was to target pt's sustained attention to a simple task. SLP went over these verbally with patient, who initially required usual verbal cues to proceed to next item; as task progressed pt continued to next item without cues. He did not complete the homework in the front of his binder, given 2 sessions ago. Based on "S" statement, SLP again reviewed calendar procedure with patient and wife. Wife had written in the schedule for this week. Wife stated "I told him he was supposed to do this." SLP explained that wife will need to be present while patient completes this task and demo'd level of assistance necessary by having patient record  upcoming dates for next week as well as 6 appointments from his therapy calendar, which took approximately 30 minutes (frequent mod cues). With prompting pt also wrote reminder to call his barber on Friday. Pt, wife to review their master calendar together at home and patient is to transfer remaining activities for next week.       Assessment / Recommendations / Plan   Plan Continue with current plan of care      Progression Toward Goals   Progression toward goals Progressing toward goals   ? consistency with assistance at home           SLP Education - 02/19/20 1649    Education Details patient will need assistance in recording calendar activities    Ploeger(s) Educated Patient;Spouse    Methods Explanation;Demonstration;Verbal cues    Comprehension Verbalized understanding;Need further instruction            SLP Short Term Goals - 02/19/20 1650      SLP SHORT TERM GOAL #1   Title pt will demo sustained attention for 10- minute functional task x 3, in 3 sessions    Baseline 02-16-20 02/18/20    Time 2    Period Weeks    Status On-going      SLP SHORT TERM GOAL #2   Title pt will demo the knowledge that he can use a memory system for daily events, ID'ing prospective and past events, medication management, and appointment tracking, etc x 3 sessions    Time 2    Period Weeks    Status On-going      SLP SHORT TERM GOAL #3   Title pt will complete standardized cognitive linguistic testing by visit 4    Status Achieved      SLP SHORT TERM GOAL #4   Title pt will exhibit emergent awareness in simple cognitive linguistic tasks    Time 2    Period Weeks    Status On-going            SLP Long Term Goals - 02/19/20 1651      SLP LONG TERM GOAL #1   Title pt will demo selective attention for 10 minutes to perform simple cognitive linguistic tasks in a min-mod noisy environment x2 sessions    Time 6    Period Weeks    Status On-going      SLP LONG TERM GOAL #2   Title pt  will use memory system to record prospective or recall past events with occasional min A x 3 visits    Time 6    Period Weeks    Status On-going      SLP LONG TERM GOAL #3   Title pt will use anomia compensations successfully in 10 minutes  simple-mod complex conversation over 2 therapy sessions    Time 6    Period Weeks    Status On-going      SLP LONG TERM GOAL #4   Title pt will exhibit emergent awareness in common min-mod complex household tasks with modified inedpendence in 3 sessions    Time 6    Period Weeks    Status On-going            Plan - 02/19/20 1650    Clinical Impression Statement [name] [name] is a pleasant 74 year old gentleman who presents with at least moderate cognitive deficits s/p CVA. Pt had history of cognitive deficits prior to CVA, and was undergoing w/u for dementia prior to hospitalization per MD notes. Prior infarct and white matter changes are noted on an MRI from 2011. 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 He managed finances and medications and helped with household chores prior to CVA, but wife is managing this now. I recommend skilled ST with emphasis on compensations to improve pt's functional recall and participation in simple household tasks.    Speech Therapy Frequency 2x / week    Duration --   8 weeks or 17 visits   Treatment/Interventions Compensatory strategies;Functional tasks;Patient/family education;Cognitive reorganization;Multimodal communcation approach;SLP instruction and feedback;Internal/external aids;Language facilitation;Compensatory techniques    Potential to Achieve Goals Fair    Potential Considerations Previous level of function    SLP Home Exercise Plan Get planner or binder for pt for memory notebook    Consulted and Agree with Plan of Care Patient;Family member/caregiver           Patient will benefit from skilled therapeutic intervention in order to improve the following  deficits and impairments:   Cognitive communication deficit    Problem List Patient Active Problem List   Diagnosis Date Noted   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   History of repair of dissecting thoracic aneurysm    Abdominal distension    Pulmonary embolus ([place]) 10/19/2019   CVA (cerebral vascular accident) ([place]) 10/19/2019   AKI (acute kidney injury) ([place]) 10/19/2019   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 [place], [place], [address] Phone: [phone]   Fax:  [phone]   Name: [name] [name] MRN: [id number] Date of Birth: 1946-04-26

## 2020-02-23 ENCOUNTER — Ambulatory Visit: Payer: Medicare Other

## 2020-02-23 ENCOUNTER — Other Ambulatory Visit: Payer: Self-pay

## 2020-02-23 ENCOUNTER — Ambulatory Visit: Payer: Medicare Other | Admitting: Speech Pathology

## 2020-02-23 DIAGNOSIS — R41841 Cognitive communication deficit: Secondary | ICD-10-CM | POA: Diagnosis not present

## 2020-02-23 DIAGNOSIS — R2689 Other abnormalities of gait and mobility: Secondary | ICD-10-CM

## 2020-02-23 DIAGNOSIS — M6281 Muscle weakness (generalized): Secondary | ICD-10-CM

## 2020-02-23 DIAGNOSIS — R278 Other lack of coordination: Secondary | ICD-10-CM

## 2020-02-23 DIAGNOSIS — R2681 Unsteadiness on feet: Secondary | ICD-10-CM

## 2020-02-23 NOTE — Therapy (Signed)
[facility] [address] [place], [place], [address] Phone: [phone]   Fax:  [phone]  Physical Therapy Treatment  Patient Details  Name: [name] [name] MRN: [id number] Date of Birth: [birth year] Referring Provider (PT): Alysia Penna   Encounter Date: 02/23/2020   PT End of Session - 02/23/20 1538    Visit Number 7    Number of Visits 17    Date for PT Re-Evaluation 04/26/20    Authorization Type Medicare payer so 10th visit progress note, FOTO    PT Start Time 1534    PT Stop Time 1615    PT Time Calculation (min) 41 min    Equipment Utilized During Treatment Gait belt    Activity Tolerance Patient tolerated treatment well    Behavior During Therapy [facility] for tasks assessed/performed           Past Medical History:  Diagnosis Date  . Chronic back pain    stenosis  . Enlarged prostate   . GERD (gastroesophageal reflux disease)    occasionally will take a zantac(maybe once a month)  . History of colon polyps   . History of kidney stones   . History of stress test    done 10 yrs. ago, as a baseline   . Hyperlipidemia    takes Crestor daily  . Hypertension    takes Amlodipine daily and Lotensin as well  . Stroke [facility])     Past Surgical History:  Procedure Laterality Date  . ANTERIOR LAT LUMBAR FUSION Left 12/15/2014   Procedure: ANTERIOR LATERAL LUMBAR FUSION 1 LEVEL;  Surgeon: Phylliss Bob, MD;  Location: [place];  Service: Orthopedics;  Laterality: Left;  Left sided lateral lumbar interbody fusion, lumbar 3-4, posterior spinal fusion, lumbar 3-4 with instrumentation.  . COLONOSCOPY    . ESOPHAGOGASTRODUODENOSCOPY    . fatty tissue removed from stomach    . LUMBAR LAMINECTOMY/DECOMPRESSION MICRODISCECTOMY N/A 07/08/2013   Procedure: LUMBAR LAMINECTOMY/DECOMPRESSION MICRODISCECTOMY;  Surgeon: Sinclair Ship, MD;  Location: [place];  Service: Orthopedics;  Laterality: N/A;  Lumbar 3-4, lumbar 4-5  decompression  . RADIOLOGY WITH ANESTHESIA N/A 10/22/2019   Procedure: MRI WITH ANESTHESIA OF LUMBAR SPINE WITHOUT CONTRAST;  Surgeon: Radiologist, Medication, MD;  Location: [place];  Service: Radiology;  Laterality: N/A;  . right ankle surgery     as child   . THORACIC AORTIC ENDOVASCULAR STENT GRAFT N/A 10/15/2019   Procedure: THORACIC AORTIC ENDOVASCULAR STENT GRAFT;  Surgeon: Waynetta Sandy, MD;  Location: [place];  Service: Vascular;  Laterality: N/A;  . TONSILLECTOMY     as a child    There were no vitals filed for this visit.   Subjective Assessment - 02/23/20 1536    Subjective Patient reports doing well since last visit. Minor low back pain today.    Currently in Pain? Yes    Pain Score 5     Pain Location Back    Pain Orientation Lower    Pain Descriptors / Indicators Aching    Pain Type Chronic pain    Pain Onset More than a month ago    Pain Frequency Constant                             [facility] Adult PT Treatment/Exercise - 02/23/20 0001      Transfers   Transfers Sit to Stand;Stand to Sit    Sit to Stand 5: Supervision  Stand to Sit 5: Supervision    Comments completed sit <> stand training with blue airex under BLE to further promote BLE strengthening and balance. patient demo increased difficulty maintaining balance at times. verbal cues for improved forward lean with completion.       Ambulation/Gait   Ambulation/Gait Yes    Ambulation/Gait Assistance 5: Supervision;4: Min guard    Ambulation/Gait Assistance Details completed gait training with rollator on outdoor/indoor surfaces, 230 ft x 1 on indoor surfaces, and 600 ft x 1 on outdoor surfaces. Overall patient demos improved posture, improved endurance, and improved gait speed when ambulating with rollator at this time. Patient also verbalizing that he notices improvements with this AD. followed with gait training w/o AD to further challenge endurance and balance, pt require intermittent  CGA with ambulation and reports increased fatigue and pain in low back after ambulating without AD.     Ambulation Distance (Feet) 600 Feet   230 x 1, 230 x 1    Assistive device Rollator    Gait Pattern Step-through pattern    Ambulation Surface Level;Indoor;Outdoor;Paved    Gait velocity 10.07 secs = 0.99 m/s    Gait Comments PT provided education on proper use and brake management of rollator. patient did require verbal cueing for proper use when negotiating to sitting area, requiring verbal cues to ensure bring rollator with him and locks brakes prior to sitting for improved safety. Patient and wife reporting that they plan to purchase one tomorrow. PT providing education on ensuring proper height and demo how to change height. Encouraged if they do purchase device tomorrow to bring it in to next session.                     PT Short Term Goals - 02/01/20 1827      PT SHORT TERM GOAL #1   Title Pt will be independent with initial HEP for strength and flexibility and balance.    Time 4    Period Weeks    Status New    Target Date 02/26/20      PT SHORT TERM GOAL #2   Title Pt will be instructed in back safety including proper body mechanics and positioning to better manage back pain and verbalize understanding.    Time 4    Period Weeks    Status New    Target Date 02/26/20      PT SHORT TERM GOAL #3   Title Pt will be able to perform transfers sit to stand from varied surfaces mod I for improved mobility and functional strength.    Time 4    Period Weeks    Status New    Target Date 02/26/20      PT SHORT TERM GOAL #4   Title Pt will ambulate with RW >300' on varied surfaces mod I for improved mobility.    Time 4    Period Weeks    Status New    Target Date 02/26/20      PT SHORT TERM GOAL #5   Title Patient will be able to perform 5x sit to stand without UE support to improve functional strength    Baseline 0 reps (02/01/20)    Time 4    Period Weeks     Status New    Target Date 02/29/20             PT Long Term Goals - 02/01/20 1825      PT LONG TERM  GOAL #1   Title Pt will be independent in progressive HEP for strength, balance and functional mobility to continue gains on own.    Baseline HEP issued 02/01/20    Time 8    Period Weeks    Status New      PT LONG TERM GOAL #2   Title Pt will increase gait speed from 0.86m/s to >0.53m/s for improved community ambulation.    Baseline 0.85m/s on 01/26/20    Time 8    Period Weeks    Status New      PT LONG TERM GOAL #3   Title Pt will decrease TUG from 29 sec to <24 sec for improved balance and functional strength to decrease fall risk.    Baseline 29 sec on 01/26/20    Time 8    Period Weeks    Status New      PT LONG TERM GOAL #4   Title Pt will ambulate >400' on varied surfaces with LRAD versus no AD for short community distances mod I.    Time 8    Period Weeks    Status New      PT LONG TERM GOAL #5   Title Pt will ambulate up/down a flight of stairs with 1 rail right mod I for improved safety in home.    Time 8    Period Weeks    Status New      PT LONG TERM GOAL #6   Title Pt will demo >50/56 on BBS to improve balance and reduce fall risk    Baseline 42/56 (02/01/20)    Time 8    Period Weeks    Status New                 Plan - 02/23/20 1920    Clinical Impression Statement Today's session focused on extensive gait training. Completed gait training on indoor/outdoor surfaces with rollator, with patient reporting improved endurance and comfort level with this AD. Patient and wife plan to purchase rollator tomorrow, with PT encouraging to bring to next session if obtain a rollator. Pt will continue to benefit from skilled PT services to progress toward goals.    Personal Factors and Comorbidities Comorbidity 3+    Comorbidities 74 y.o. right-handed male with history of chronic back pain with lumbar discectomy 2014 as well as anterior lateral lumbar fusion  2016, hypertension, diabetes mellitus, hyperlipidemia and remote tobacco abuse.    Examination-Activity Limitations Bed Mobility;Transfers;Locomotion Level;Stairs    Examination-Participation Restrictions Community Activity;Driving;Cleaning;Interpersonal Relationship    Stability/Clinical Decision Making Evolving/Moderate complexity    Rehab Potential Good    PT Frequency 2x / week    PT Duration 8 weeks    PT Treatment/Interventions ADLs/Self Care Home Management;DME Instruction;Moist Heat;Cryotherapy;Gait training;Stair training;Functional mobility training;Therapeutic activities;Neuromuscular re-education;Balance training;Therapeutic exercise;Patient/family education;Manual techniques;Passive range of motion;Vestibular    PT Next Visit Plan Did they get rollator? Check STGs. Monitor vitals with activity. Continue with gait training. Continue with more balance training and LE strengthening with focus on hips and hamstrings in more flexion based activities due to spinal stenosis. No supine SLR due to back issues and aortic dissection.    Consulted and Agree with Plan of Care Patient;Family member/caregiver    Family Member Consulted wife, Barbaraann Share           Patient will benefit from skilled therapeutic intervention in order to improve the following deficits and impairments:  Abnormal gait, Decreased activity tolerance, Decreased knowledge of use of DME, Decreased  balance, Decreased endurance, Decreased range of motion, Decreased strength, Decreased mobility, Pain, Postural dysfunction, Impaired flexibility  Visit Diagnosis: Muscle weakness (generalized)  Other abnormalities of gait and mobility  Unsteadiness on feet  Other lack of coordination     Problem List Patient Active Problem List   Diagnosis Date Noted  . 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 History of repair of dissecting thoracic aneurysm   . Abdominal distension   . Pulmonary  embolus ([place]) 10/19/2019  . CVA (cerebral vascular accident) ([place]) 10/19/2019  . AKI (acute kidney injury) ([place]) 10/19/2019  . 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 Ileus ([place]) 10/19/2019  . 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 Radiculopathy 12/15/2014    Jones Bales, PT, DPT 02/23/2020, 7:23 PM  [facility] [address] [place], [place], [address] Phone: [phone]   Fax:  [phone]  Name: [name] [name] MRN: [id number] Date of Birth: [birth year]

## 2020-02-23 NOTE — Therapy (Signed)
[facility] [address] [place], [place], [address] Phone: [phone]   Fax:  [phone]  Speech Language Pathology Treatment  Patient Details  Name: [name] [name] MRN: [id number] Date of Birth: Dec 08, 1945 Referring Provider (SLP): Dr. Letta Pate   Encounter Date: 02/23/2020   End of Session - 02/23/20 2145    Visit Number 6    Number of Visits 17    Date for SLP Re-Evaluation 04/20/20    Authorization Type [employer] Medicare, $35 copay for all 3 disciplines on one day    SLP Start Time 1455   pt 10 min late   SLP Stop Time  1530    SLP Time Calculation (min) 35 min    Activity Tolerance Patient tolerated treatment well           Past Medical History:  Diagnosis Date  . Chronic back pain    stenosis  . Enlarged prostate   . GERD (gastroesophageal reflux disease)    occasionally will take a zantac(maybe once a month)  . History of colon polyps   . History of kidney stones   . History of stress test    done 10 yrs. ago, as a baseline   . Hyperlipidemia    takes Crestor daily  . Hypertension    takes Amlodipine daily and Lotensin as well  . Stroke [facility])     Past Surgical History:  Procedure Laterality Date  . ANTERIOR LAT LUMBAR FUSION Left 12/15/2014   Procedure: ANTERIOR LATERAL LUMBAR FUSION 1 LEVEL;  Surgeon: Phylliss Bob, MD;  Location: [place];  Service: Orthopedics;  Laterality: Left;  Left sided lateral lumbar interbody fusion, lumbar 3-4, posterior spinal fusion, lumbar 3-4 with instrumentation.  . COLONOSCOPY    . ESOPHAGOGASTRODUODENOSCOPY    . fatty tissue removed from stomach    . LUMBAR LAMINECTOMY/DECOMPRESSION MICRODISCECTOMY N/A 07/08/2013   Procedure: LUMBAR LAMINECTOMY/DECOMPRESSION MICRODISCECTOMY;  Surgeon: Sinclair Ship, MD;  Location: [place];  Service: Orthopedics;  Laterality: N/A;  Lumbar 3-4, lumbar 4-5 decompression  . RADIOLOGY WITH ANESTHESIA N/A 10/22/2019   Procedure: MRI  WITH ANESTHESIA OF LUMBAR SPINE WITHOUT CONTRAST;  Surgeon: Radiologist, Medication, MD;  Location: [place];  Service: Radiology;  Laterality: N/A;  . right ankle surgery     as child   . THORACIC AORTIC ENDOVASCULAR STENT GRAFT N/A 10/15/2019   Procedure: THORACIC AORTIC ENDOVASCULAR STENT GRAFT;  Surgeon: Waynetta Sandy, MD;  Location: [place];  Service: Vascular;  Laterality: N/A;  . TONSILLECTOMY     as a child    There were no vitals filed for this visit.   Subjective Assessment - 02/23/20 2124    Subjective Requires cues to open binder when SLP asked about recent activities    Patient is accompained by: Family member   wife [name] [name]   Currently in Pain? Yes    Pain Score 5                  ADULT SLP TREATMENT - 02/23/20 2131      General Information   Behavior/Cognition Alert;Cooperative;Pleasant mood;Requires cueing      Treatment Provided   Treatment provided Cognitive-Linquistic      Cognitive-Linquistic Treatment   Treatment focused on Cognition    Skilled Treatment Weekly planner filled in with events from home calendar as instructed. Patient did not call his barber, "I realized my hair is growing slower." Patient added chore "wash dishes" to planner for each day. When SLP suggested  adding his HEP routines for PT, OT, patient stated x 2 he did not feel this was necessary. Currently reports completing these every other day, when not in clinic for therapy. When SLP asked questions during session (15 minutes apart) re: upcoming appointments/schedule, patient required verbal cues to attempt/locate weekly planner sheet. For simple cognitive-linguistic tasks, patient required mod cues for attention, awareness of errors or repeating instructions/questions already completed. Pt placed homework (dated for 7/6 and 7/7) in ST section of binder with mod cues.       Assessment / Recommendations / Plan   Plan Continue with current plan of care      Progression Toward Goals     Progression toward goals --   slow progress; severity of awareness and attn deficits             SLP Short Term Goals - 02/23/20 2147      SLP SHORT TERM GOAL #1   Title pt will demo sustained attention for 10- minute functional task x 3, in 3 sessions    Baseline 02-16-20 02/18/20    Time 1    Period Weeks    Status On-going      SLP SHORT TERM GOAL #2   Title pt will demo the knowledge that he can use a memory system for daily events, ID'ing prospective and past events, medication management, and appointment tracking, etc x 3 sessions    Time 1    Period Weeks    Status On-going      SLP SHORT TERM GOAL #3   Title pt will complete standardized cognitive linguistic testing by visit 4    Status Achieved      SLP SHORT TERM GOAL #4   Title pt will exhibit emergent awareness in simple cognitive linguistic tasks    Time 1    Period Weeks    Status On-going            SLP Long Term Goals - 02/23/20 2148      SLP LONG TERM GOAL #1   Title pt will demo selective attention for 10 minutes to perform simple cognitive linguistic tasks in a min-mod noisy environment x2 sessions    Time 5    Period Weeks    Status On-going      SLP LONG TERM GOAL #2   Title pt will use memory system to record prospective or recall past events with occasional min A x 3 visits    Time 5    Period Weeks    Status On-going      SLP LONG TERM GOAL #3   Title pt will use anomia compensations successfully in 10 minutes simple-mod complex conversation over 2 therapy sessions    Time 5    Period Weeks    Status On-going      SLP LONG TERM GOAL #4   Title pt will exhibit emergent awareness in common min-mod complex household tasks with modified inedpendence in 3 sessions    Time 5    Period Weeks    Status On-going            Plan - 02/23/20 2146    Clinical Impression Statement [name] [name] is a pleasant 74 year old gentleman who presents with at least moderate cognitive deficits s/p CVA.  Pt had history of cognitive deficits prior to CVA, and was undergoing w/u for dementia prior to hospitalization per MD notes. Prior infarct and white matter changes are noted on an MRI from  2011. 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 He managed finances and medications and helped with household chores prior to CVA, but wife is managing this now. I recommend skilled ST with emphasis on compensations to improve pt's functional recall and participation in simple household tasks.    Speech Therapy Frequency 2x / week    Duration --   8 weeks or 17 visits   Treatment/Interventions Compensatory strategies;Functional tasks;Patient/family education;Cognitive reorganization;Multimodal communcation approach;SLP instruction and feedback;Internal/external aids;Language facilitation;Compensatory techniques    Potential to Achieve Goals Fair    Potential Considerations Previous level of function    SLP Home Exercise Plan Get planner or binder for pt for memory notebook    Consulted and Agree with Plan of Care Patient;Family member/caregiver           Patient will benefit from skilled therapeutic intervention in order to improve the following deficits and impairments:   Cognitive communication deficit    Problem List Patient Active Problem List   Diagnosis Date Noted  . 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 History of repair of dissecting thoracic aneurysm   . Abdominal distension   . Pulmonary embolus ([place]) 10/19/2019  . CVA (cerebral vascular accident) ([place]) 10/19/2019  . AKI (acute kidney injury) ([place]) 10/19/2019  . 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 Ileus ([place]) 10/19/2019  . 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 Radiculopathy 12/15/2014   Deneise Lever, [place],  [place] 02/23/2020, 9:49 PM  [facility] [address] [place], [place], [address] Phone: [phone]   Fax:  [phone]   Name: [name] [name] MRN: [id number] Date of Birth: 1946-05-26

## 2020-02-25 ENCOUNTER — Ambulatory Visit: Payer: Medicare Other

## 2020-02-25 ENCOUNTER — Ambulatory Visit: Payer: Medicare Other | Admitting: Occupational Therapy

## 2020-02-25 ENCOUNTER — Other Ambulatory Visit: Payer: Self-pay

## 2020-02-25 DIAGNOSIS — R41840 Attention and concentration deficit: Secondary | ICD-10-CM

## 2020-02-25 DIAGNOSIS — R41841 Cognitive communication deficit: Secondary | ICD-10-CM | POA: Diagnosis not present

## 2020-02-25 DIAGNOSIS — R278 Other lack of coordination: Secondary | ICD-10-CM

## 2020-02-25 DIAGNOSIS — R2681 Unsteadiness on feet: Secondary | ICD-10-CM

## 2020-02-25 DIAGNOSIS — R2689 Other abnormalities of gait and mobility: Secondary | ICD-10-CM

## 2020-02-25 DIAGNOSIS — M6281 Muscle weakness (generalized): Secondary | ICD-10-CM

## 2020-02-25 NOTE — Patient Instructions (Signed)
  Please complete the assigned speech therapy homework prior to your next session and return it to the speech therapist at your next visit.  

## 2020-02-25 NOTE — Patient Instructions (Signed)

## 2020-02-25 NOTE — Therapy (Signed)
[facility] [address] [place], [place], [address] Phone: [phone]   Fax:  [phone]  Physical Therapy Treatment  Patient Details  Name: [name] [name] MRN: [id number] Date of Birth: 1945/12/04 Referring Provider (PT): Alysia Penna   Encounter Date: 02/25/2020   PT End of Session - 02/25/20 1404    Visit Number 8    Number of Visits 17    Date for PT Re-Evaluation 04/26/20    Authorization Type Medicare payer so 10th visit progress note, FOTO    PT Start Time 1402    PT Stop Time 1443    PT Time Calculation (min) 41 min    Equipment Utilized During Treatment Gait belt    Activity Tolerance Patient tolerated treatment well    Behavior During Therapy [facility] for tasks assessed/performed           Past Medical History:  Diagnosis Date  . Chronic back pain    stenosis  . Enlarged prostate   . GERD (gastroesophageal reflux disease)    occasionally will take a zantac(maybe once a month)  . History of colon polyps   . History of kidney stones   . History of stress test    done 10 yrs. ago, as a baseline   . Hyperlipidemia    takes Crestor daily  . Hypertension    takes Amlodipine daily and Lotensin as well  . Stroke [facility])     Past Surgical History:  Procedure Laterality Date  . ANTERIOR LAT LUMBAR FUSION Left 12/15/2014   Procedure: ANTERIOR LATERAL LUMBAR FUSION 1 LEVEL;  Surgeon: Phylliss Bob, MD;  Location: [facility];  Service: Orthopedics;  Laterality: Left;  Left sided lateral lumbar interbody fusion, lumbar 3-4, posterior spinal fusion, lumbar 3-4 with instrumentation.  . COLONOSCOPY    . ESOPHAGOGASTRODUODENOSCOPY    . fatty tissue removed from stomach    . LUMBAR LAMINECTOMY/DECOMPRESSION MICRODISCECTOMY N/A 07/08/2013   Procedure: LUMBAR LAMINECTOMY/DECOMPRESSION MICRODISCECTOMY;  Surgeon: Sinclair Ship, MD;  Location: [facility];  Service: Orthopedics;  Laterality: N/A;  Lumbar 3-4, lumbar 4-5  decompression  . RADIOLOGY WITH ANESTHESIA N/A 10/22/2019   Procedure: MRI WITH ANESTHESIA OF LUMBAR SPINE WITHOUT CONTRAST;  Surgeon: Radiologist, Medication, MD;  Location: [facility];  Service: Radiology;  Laterality: N/A;  . right ankle surgery     as child   . THORACIC AORTIC ENDOVASCULAR STENT GRAFT N/A 10/15/2019   Procedure: THORACIC AORTIC ENDOVASCULAR STENT GRAFT;  Surgeon: Waynetta Sandy, MD;  Location: [facility];  Service: Vascular;  Laterality: N/A;  . TONSILLECTOMY     as a child    There were no vitals filed for this visit.   Subjective Assessment - 02/25/20 1403    Subjective Pt got his rollator and has been using and really enjoying it. He also got the epidural injection yesterday which has helped some.    Currently in Pain? Yes    Pain Score 8     Pain Location Back    Pain Orientation Lower    Pain Descriptors / Indicators Aching    Pain Type Chronic pain    Pain Onset More than a month ago                             [facility] Adult PT Treatment/Exercise - 02/25/20 1404      Transfers   Transfers Sit to Stand;Stand to Sit    Sit to Stand  6: Modified independent (Device/Increase time)    Five time sit to stand comments  23 sec from lowered mat with light hand support    Stand to Sit 6: Modified independent (Device/Increase time)    Comments PT worked on transfers with rollator with sit to stand including locking brakes. Also cued to be sure not to push back with legs and lightly push staight down. Able to perform supervision. PT also suggested placing walker against wall if available to further increase safety with rising.      Ambulation/Gait   Ambulation/Gait Yes    Ambulation/Gait Assistance 6: Modified independent (Device/Increase time)    Ambulation/Gait Assistance Details Pt denied any increase in back pain with gait with rollator. PT made sure walker adjusted to proper height to start.    Ambulation Distance (Feet) 460 Feet    Assistive  device Rollator    Gait Pattern Step-through pattern    Ambulation Surface Level;Indoor    Ramp 5: Supervision    Ramp Details (indicate cue type and reason) Verbal cues to tip back slightly to clear lip on ramp when getting on with rollator. Instructed to keep hands on brakes going down for safety.    Curb 5: Supervision    Curb Details (indicate cue type and reason) pt was instructed in how to lift up using bar on rollator to get front wheels on curb and then lift back wheels up after.      Therapeutic Activites    Therapeutic Activities Other Therapeutic Activities    Other Therapeutic Activities PT reviewed bed mobility with utilizing log roll. Pt able to demonstrate well. Then went over squatting to pick up object from floor. Pt able to demonstrate squatting to pick up 2.2# ball on floor. Discussed avoiding excessive bending/lifting/twisting to protect back. Pt does have grabber at home to use for lighter things.      Neuro Re-ed    Neuro Re-ed Details  In // bars: tandem gait with RUE support 6' x 4, marching gait without UE support 6' x 4 CGA, side stepping without UE support 6' x 4. Pt had increased sway with marching without UE support reporting some pressure in back.                  PT Education - 02/25/20 1942    Education Details Back safety.    [place](s) Educated Patient    Methods Explanation    Comprehension Verbalized understanding            PT Short Term Goals - 02/25/20 1407      PT SHORT TERM GOAL #1   Title Pt will be independent with initial HEP for strength and flexibility and balance.    Baseline Pt denies any problems with initial exercises. Knows he needs to do more.    Time 4    Period Weeks    Status Achieved    Target Date 02/26/20      PT SHORT TERM GOAL #2   Title Pt will be instructed in back safety including proper body mechanics and positioning to better manage back pain and verbalize understanding.    Baseline Pt was able to  demonstrate and verbalize proper body mechanics with bed mobility and lifting    Time 4    Period Weeks    Status Achieved    Target Date 02/26/20      PT SHORT TERM GOAL #3   Title Pt will be able to perform transfers sit to  stand from varied surfaces mod I for improved mobility and functional strength.    Baseline mod I with sit to stand transfers with UE support.    Time 4    Period Weeks    Status Achieved    Target Date 02/26/20      PT SHORT TERM GOAL #4   Title Pt will ambulate with RW >300' on varied surfaces mod I for improved mobility.    Baseline 345' on level surfaces with rollator, supervision on nonlevel    Time 4    Period Weeks    Status Partially Met    Target Date 02/26/20      PT SHORT TERM GOAL #5   Title Patient will be able to perform 5x sit to stand without UE support to improve functional strength    Baseline 0 reps (02/01/20), 02/25/20 pt needs UE support to rise    Time 4    Period Weeks    Status Not Met    Target Date 02/29/20             PT Long Term Goals - 02/01/20 1825      PT LONG TERM GOAL #1   Title Pt will be independent in progressive HEP for strength, balance and functional mobility to continue gains on own.    Baseline HEP issued 02/01/20    Time 8    Period Weeks    Status New      PT LONG TERM GOAL #2   Title Pt will increase gait speed from 0.25ms to >0.767m for improved community ambulation.    Baseline 0.5829mon 01/26/20    Time 8    Period Weeks    Status New      PT LONG TERM GOAL #3   Title Pt will decrease TUG from 29 sec to <24 sec for improved balance and functional strength to decrease fall risk.    Baseline 29 sec on 01/26/20    Time 8    Period Weeks    Status New      PT LONG TERM GOAL #4   Title Pt will ambulate >400' on varied surfaces with LRAD versus no AD for short community distances mod I.    Time 8    Period Weeks    Status New      PT LONG TERM GOAL #5   Title Pt will ambulate up/down a flight of  stairs with 1 rail right mod I for improved safety in home.    Time 8    Period Weeks    Status New      PT LONG TERM GOAL #6   Title Pt will demo >50/56 on BBS to improve balance and reduce fall risk    Baseline 42/56 (02/01/20)    Time 8    Period Weeks    Status New                 Plan - 02/25/20 1945    Clinical Impression Statement Pt met 3/5 STGs today. He did purchase rollator and is showing good stability with gait on level surfaces mod I and supervision on nonlevel surfaces. Able to walk further with less pain in back with rollator. Pt able to perform sit to stand mod I with less UE support and with more ease but does need light UE support from standard height chair. Pt continues to benefit from skilled PT to address strength, balance and functional mobility deficits.  Personal Factors and Comorbidities Comorbidity 3+    Comorbidities 74 y.o. right-handed male with history of chronic back pain with lumbar discectomy 2014 as well as anterior lateral lumbar fusion 2016, hypertension, diabetes mellitus, hyperlipidemia and remote tobacco abuse.    Examination-Activity Limitations Bed Mobility;Transfers;Locomotion Level;Stairs    Examination-Participation Restrictions Community Activity;Driving;Cleaning;Interpersonal Relationship    Stability/Clinical Decision Making Evolving/Moderate complexity    Rehab Potential Good    PT Frequency 2x / week    PT Duration 8 weeks    PT Treatment/Interventions ADLs/Self Care Home Management;DME Instruction;Moist Heat;Cryotherapy;Gait training;Stair training;Functional mobility training;Therapeutic activities;Neuromuscular re-education;Balance training;Therapeutic exercise;Patient/family education;Manual techniques;Passive range of motion;Vestibular    PT Next Visit Plan Monitor vitals with activity. Continue with gait training with rollator on varied surfaces. Continue with more balance training and LE strengthening with focus on hips and  hamstrings in more flexion based activities due to spinal stenosis. No supine SLR due to back issues and aortic dissection.    Consulted and Agree with Plan of Care Patient;Family member/caregiver    Family Member Consulted wife, Barbaraann Share           Patient will benefit from skilled therapeutic intervention in order to improve the following deficits and impairments:  Abnormal gait, Decreased activity tolerance, Decreased knowledge of use of DME, Decreased balance, Decreased endurance, Decreased range of motion, Decreased strength, Decreased mobility, Pain, Postural dysfunction, Impaired flexibility  Visit Diagnosis: Other abnormalities of gait and mobility  Muscle weakness (generalized)     Problem List Patient Active Problem List   Diagnosis Date Noted  . 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 History of repair of dissecting thoracic aneurysm   . Abdominal distension   . Pulmonary embolus ([facility]) 10/19/2019  . CVA (cerebral vascular accident) ([place]) 10/19/2019  . AKI (acute kidney injury) ([place]) 10/19/2019  . 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 Ileus ([place]) 10/19/2019  . 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 Radiculopathy 12/15/2014    Electa Sniff, PT, DPT, NCS 02/25/2020, 7:49 PM  [facility] [address] [place], [place], [address] Phone: [phone]   Fax:  [phone]  Name: [name] [name] MRN: [id number] Date of Birth: 09/22/1945

## 2020-02-25 NOTE — Therapy (Signed)
[facility] [address] [place], [place], [address] Phone: [phone]   Fax:  [phone]  Speech Language Pathology Treatment  Patient Details  Name: [name] [name] MRN: [id number] Date of Birth: 12-15-1945 Referring Provider (SLP): Dr. Letta Pate   Encounter Date: 02/25/2020   End of Session - 02/25/20 1703    Visit Number 7    Number of Visits 17    Date for SLP Re-Evaluation 04/20/20    SLP Start Time 1448    SLP Stop Time  1530    SLP Time Calculation (min) 42 min    Activity Tolerance Patient tolerated treatment well           Past Medical History:  Diagnosis Date  . Chronic back pain    stenosis  . Enlarged prostate   . GERD (gastroesophageal reflux disease)    occasionally will take a zantac(maybe once a month)  . History of colon polyps   . History of kidney stones   . History of stress test    done 10 yrs. ago, as a baseline   . Hyperlipidemia    takes Crestor daily  . Hypertension    takes Amlodipine daily and Lotensin as well  . Stroke [facility])     Past Surgical History:  Procedure Laterality Date  . ANTERIOR LAT LUMBAR FUSION Left 12/15/2014   Procedure: ANTERIOR LATERAL LUMBAR FUSION 1 LEVEL;  Surgeon: Phylliss Bob, MD;  Location: [facility];  Service: Orthopedics;  Laterality: Left;  Left sided lateral lumbar interbody fusion, lumbar 3-4, posterior spinal fusion, lumbar 3-4 with instrumentation.  . COLONOSCOPY    . ESOPHAGOGASTRODUODENOSCOPY    . fatty tissue removed from stomach    . LUMBAR LAMINECTOMY/DECOMPRESSION MICRODISCECTOMY N/A 07/08/2013   Procedure: LUMBAR LAMINECTOMY/DECOMPRESSION MICRODISCECTOMY;  Surgeon: Sinclair Ship, MD;  Location: [facility];  Service: Orthopedics;  Laterality: N/A;  Lumbar 3-4, lumbar 4-5 decompression  . RADIOLOGY WITH ANESTHESIA N/A 10/22/2019   Procedure: MRI WITH ANESTHESIA OF LUMBAR SPINE WITHOUT CONTRAST;  Surgeon: Radiologist, Medication, MD;   Location: [facility];  Service: Radiology;  Laterality: N/A;  . right ankle surgery     as child   . THORACIC AORTIC ENDOVASCULAR STENT GRAFT N/A 10/15/2019   Procedure: THORACIC AORTIC ENDOVASCULAR STENT GRAFT;  Surgeon: Waynetta Sandy, MD;  Location: [facility];  Service: Vascular;  Laterality: N/A;  . TONSILLECTOMY     as a child    There were no vitals filed for this visit.          ADULT SLP TREATMENT - 02/25/20 1658      General Information   Behavior/Cognition Alert;Cooperative;Pleasant mood;Confused;Requires cueing;Decreased sustained attention      Treatment Provided   Treatment provided Cognitive-Linquistic      Cognitive-Linquistic Treatment   Treatment focused on Cognition    Skilled Treatment Weekly planner for week of 02-22-20 was filled in and homework was completed (unsure if wife assisted with reminding pt to do this or not). SLP targeted pt's attention skills in a functional sustained and alternating attention task (trnasferring therpay appointment info to a July 2021 calendar). Pt req'd extra time and initial usual mod cues faded to rare min-mod cues back to task when pt was distracted. Limited/no emergent wareness was demonstrated as SLP had to consistently cue pt when errors were made. SLP directly educated pt wife that she will need to sit with pt to complete homework and possibly to guide him back on task. SLP specificaly told  wife to "hint" to patient and not give him the answer -"we want his brain to do the work."      Assessment / Recommendations / Plan   Plan Continue with current plan of care      Progression Toward Goals   Progression toward goals --   severity of deficit concerning for max progress           SLP Education - 02/25/20 1703    Education Details how to cue pt at home (wife)    [place](s) Educated Patient;Spouse    Methods Explanation;Demonstration    Comprehension Verbalized understanding;Need further instruction            SLP  Short Term Goals - 02/25/20 1455      SLP SHORT TERM GOAL #1   Title pt will demo sustained attention for 10- minute functional task x 3, in 3 sessions    Baseline 02-16-20 02/18/20    Time --    Period --    Status Partially Met      SLP SHORT TERM GOAL #2   Title pt will demo the knowledge that he can use a memory system for daily events, ID'ing prospective and past events, medication management, and appointment tracking, etc x 3 sessions    Time --    Period --    Status Not Met      SLP SHORT TERM GOAL #3   Title pt will complete standardized cognitive linguistic testing by visit 4    Status Achieved      SLP SHORT TERM GOAL #4   Title pt will exhibit emergent awareness in simple cognitive linguistic tasks    Time --    Period --    Status Not Met            SLP Long Term Goals - 02/25/20 1704      SLP LONG TERM GOAL #1   Title pt will demo selective attention for 10 minutes to perform simple cognitive linguistic tasks in a min-mod noisy environment x2 sessions    Time 5    Period Weeks    Status On-going      SLP LONG TERM GOAL #2   Title pt will use memory system to record prospective or recall past events with occasional min A x 3 visits    Time 5    Period Weeks    Status On-going      SLP LONG TERM GOAL #3   Title pt will use anomia compensations successfully in 10 minutes simple-mod complex conversation over 2 therapy sessions    Time 5    Period Weeks    Status On-going      SLP LONG TERM GOAL #4   Title pt will exhibit emergent awareness in common min-mod complex household tasks with modified inedpendence in 3 sessions    Time 5    Period Weeks    Status On-going            Plan - 02/25/20 1703    Clinical Impression Statement [name] [name] is a pleasant 74 year old gentleman who presents with at least moderate cognitive deficits s/p CVA. Pt had history of cognitive deficits prior to CVA, and was undergoing w/u for dementia prior to hospitalization  per MD notes. Prior infarct and white matter changes are noted on an MRI from 2011. 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 He managed finances and medications and  helped with household chores prior to CVA, but wife is managing this now. I recommend skilled ST with emphasis on compensations to improve pt's functional recall and participation in simple household tasks.    Speech Therapy Frequency 2x / week    Duration --   8 weeks or 17 visits   Treatment/Interventions Compensatory strategies;Functional tasks;Patient/family education;Cognitive reorganization;Multimodal communcation approach;SLP instruction and feedback;Internal/external aids;Language facilitation;Compensatory techniques    Potential to Achieve Goals Fair    Potential Considerations Previous level of function    SLP Home Exercise Plan Get planner or binder for pt for memory notebook    Consulted and Agree with Plan of Care Patient;Family member/caregiver           Patient will benefit from skilled therapeutic intervention in order to improve the following deficits and impairments:   Cognitive communication deficit    Problem List Patient Active Problem List   Diagnosis Date Noted  . 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 History of repair of dissecting thoracic aneurysm   . Abdominal distension   . Pulmonary embolus ([place]) 10/19/2019  . CVA (cerebral vascular accident) ([place]) 10/19/2019  . AKI (acute kidney injury) ([place]) 10/19/2019  . 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 Ileus ([place]) 10/19/2019  . 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 Radiculopathy 12/15/2014    [name] ,MS, CCC-SLP  02/25/2020, 5:05 PM  [facility] [address]  [place], [place], [address] Phone: [phone]   Fax:  [phone]   Name: Maxime Beckner Etheridge MRN: [id number] Date of Birth: [birth year]

## 2020-02-25 NOTE — Therapy (Signed)
[facility] [address] [place], [place], [address] Phone: [phone]   Fax:  [phone]  Occupational Therapy Treatment  Patient Details  Name: Lenard Kampf Marich MRN: [id number] Date of Birth: [birth year] No data recorded  Encounter Date: 02/25/2020   OT End of Session - 02/25/20 1611    Visit Number 4    Number of Visits 9    Date for OT Re-Evaluation 03/11/20    Authorization Type [employer] Medicare    Progress Note Due on Visit 10    OT Start Time 1530    OT Stop Time 1615    OT Time Calculation (min) 45 min    Activity Tolerance Patient tolerated treatment well    Behavior During Therapy [facility] for tasks assessed/performed           Past Medical History:  Diagnosis Date  . Chronic back pain    stenosis  . Enlarged prostate   . GERD (gastroesophageal reflux disease)    occasionally will take a zantac(maybe once a month)  . History of colon polyps   . History of kidney stones   . History of stress test    done 10 yrs. ago, as a baseline   . Hyperlipidemia    takes Crestor daily  . Hypertension    takes Amlodipine daily and Lotensin as well  . Stroke [facility])     Past Surgical History:  Procedure Laterality Date  . ANTERIOR LAT LUMBAR FUSION Left 12/15/2014   Procedure: ANTERIOR LATERAL LUMBAR FUSION 1 LEVEL;  Surgeon: Phylliss Bob, MD;  Location: [place];  Service: Orthopedics;  Laterality: Left;  Left sided lateral lumbar interbody fusion, lumbar 3-4, posterior spinal fusion, lumbar 3-4 with instrumentation.  . COLONOSCOPY    . ESOPHAGOGASTRODUODENOSCOPY    . fatty tissue removed from stomach    . LUMBAR LAMINECTOMY/DECOMPRESSION MICRODISCECTOMY N/A 07/08/2013   Procedure: LUMBAR LAMINECTOMY/DECOMPRESSION MICRODISCECTOMY;  Surgeon: Sinclair Ship, MD;  Location: [place];  Service: Orthopedics;  Laterality: N/A;  Lumbar 3-4, lumbar 4-5 decompression  . RADIOLOGY WITH ANESTHESIA N/A 10/22/2019   Procedure: MRI  WITH ANESTHESIA OF LUMBAR SPINE WITHOUT CONTRAST;  Surgeon: Radiologist, Medication, MD;  Location: [place];  Service: Radiology;  Laterality: N/A;  . right ankle surgery     as child   . THORACIC AORTIC ENDOVASCULAR STENT GRAFT N/A 10/15/2019   Procedure: THORACIC AORTIC ENDOVASCULAR STENT GRAFT;  Surgeon: Waynetta Sandy, MD;  Location: [place];  Service: Vascular;  Laterality: N/A;  . TONSILLECTOMY     as a child    There were no vitals filed for this visit.   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 Back surgery 2014    Limitations Blood pressure            Issued memory compensatory strategies and reviewed with patient/wife.  Discussed safety with rollator and fall prevention with functional tasks and practiced getting things out of refrigerator and lower cabinets with cues for fall prevention and positioning rollator correctly. Also practiced walking through tight spaces for walker negotiation.  UBE x 8 min for UE strengthening and endurance                    OT Education - 02/25/20 1542    Education Details memory strategies, walker negotiation  Eichenberger(s) Educated Patient;Spouse    Methods Explanation;Handout;Verbal cues    Comprehension Verbalized understanding;Returned demonstration;Need further instruction               OT Long Term Goals - 02/25/20 1612      OT LONG TERM GOAL #1   Title Patient will complete an HEP to improve strength and coordination in non dominant LUE due 03/11/20    Status Achieved      OT LONG TERM GOAL #2   Title Patient will complete 9 hole peg test with no less than 5 second difference betweeen right (dominant) and left hand to aide with manipulation of small objects.    Status Achieved   Rt = 29.91 sec,  Lt = 33.82 sec     OT LONG TERM GOAL #3   Title  Patient and his wife will demonstrate understanding of driving recommendations    Status On-going      OT LONG TERM GOAL #4   Title Patient will return to housekeeping - sweeping, dusting, vaccuuming without loss of balance, and with pain no greater than 6/10 back    Status On-going                 Plan - 02/25/20 1612    Occupational performance deficits (Please refer to evaluation for details): ADL's;IADL's;Leisure    Body Structure / Function / Physical Skills ADL;Coordination;Endurance;GMC;Muscle spasms;UE functional use;Vestibular;Decreased knowledge of precautions;Balance;Body mechanics;Decreased knowledge of use of DME;Flexibility;IADL;Pain;Vision;Strength;FMC;Dexterity    Cognitive Skills Attention;Safety Awareness;Problem Solve;Thought;Sequencing    Rehab Potential Good    Comorbidities impacting occupational performance description: HTN, BPH, Prior back surgeries    OT Frequency 2x / week    OT Duration 4 weeks    OT Treatment/Interventions Self-care/ADL training;Therapeutic exercise;Visual/perceptual remediation/compensation;Patient/family education;Neuromuscular education;Moist Heat;Functional Furniture conservator/restorer;Therapeutic activities;Balance training;Manual Therapy;DME and/or AE instruction;Cryotherapy;Aquatic Therapy    Plan simulate sweeping and give options for balance    Consulted and Agree with Plan of Care Patient;Family member/caregiver    Family Member Consulted wife - "weezy"           Patient will benefit from skilled therapeutic intervention in order to improve the following deficits and impairments:   Body Structure / Function / Physical Skills: ADL, Coordination, Endurance, GMC, Muscle spasms, UE functional use, Vestibular, Decreased knowledge of precautions, Balance, Body mechanics, Decreased knowledge of use of DME, Flexibility, IADL, Pain, Vision, Strength, FMC, Dexterity Cognitive Skills: Attention, Safety Awareness, Problem Solve, Thought, Sequencing      Visit Diagnosis: Attention and concentration deficit  Other lack of coordination  Unsteadiness on feet    Problem List Patient Active Problem List   Diagnosis Date Noted  . 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 History of repair of dissecting thoracic aneurysm   . Abdominal distension   . Pulmonary embolus ([place]) 10/19/2019  . CVA (cerebral vascular accident) ([place]) 10/19/2019  . AKI (acute kidney injury) ([place]) 10/19/2019  . 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 Ileus ([place]) 10/19/2019  . 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 Radiculopathy 12/15/2014    Carey Bullocks, OTR/L 02/25/2020, 4:35 PM  [facility] [address] [place], [place], [address] Phone: [phone]   Fax:  [phone]  Name: Malakie Balis Tremain MRN: [id number] Date of Birth: 1946/01/05

## 2020-02-29 ENCOUNTER — Ambulatory Visit: Payer: Medicare Other

## 2020-02-29 ENCOUNTER — Other Ambulatory Visit: Payer: Self-pay

## 2020-02-29 ENCOUNTER — Encounter: Payer: Self-pay | Admitting: Occupational Therapy

## 2020-02-29 ENCOUNTER — Ambulatory Visit: Payer: Medicare Other | Admitting: Occupational Therapy

## 2020-02-29 VITALS — BP 147/84 | HR 60

## 2020-02-29 DIAGNOSIS — M6281 Muscle weakness (generalized): Secondary | ICD-10-CM

## 2020-02-29 DIAGNOSIS — R41840 Attention and concentration deficit: Secondary | ICD-10-CM

## 2020-02-29 DIAGNOSIS — R2681 Unsteadiness on feet: Secondary | ICD-10-CM

## 2020-02-29 DIAGNOSIS — R278 Other lack of coordination: Secondary | ICD-10-CM

## 2020-02-29 DIAGNOSIS — R41842 Visuospatial deficit: Secondary | ICD-10-CM

## 2020-02-29 DIAGNOSIS — R41841 Cognitive communication deficit: Secondary | ICD-10-CM

## 2020-02-29 DIAGNOSIS — R2689 Other abnormalities of gait and mobility: Secondary | ICD-10-CM

## 2020-02-29 NOTE — Therapy (Signed)
[facility] [address] [place], [place], [address] Phone: [phone]   Fax:  [phone]  Physical Therapy Treatment  Patient Details  Name: [name] [name] MRN: [id number] Date of Birth: [birth year] Referring Provider (PT): Alysia Penna   Encounter Date: 02/29/2020   PT End of Session - 02/29/20 1456    Visit Number 9    Number of Visits 17    Date for PT Re-Evaluation 04/26/20    Authorization Type Medicare payer so 10th visit progress note, FOTO    PT Start Time 1450    PT Stop Time 1529    PT Time Calculation (min) 39 min    Equipment Utilized During Treatment Gait belt    Activity Tolerance Patient tolerated treatment well    Behavior During Therapy [facility] for tasks assessed/performed           Past Medical History:  Diagnosis Date  . Chronic back pain    stenosis  . Enlarged prostate   . GERD (gastroesophageal reflux disease)    occasionally will take a zantac(maybe once a month)  . History of colon polyps   . History of kidney stones   . History of stress test    done 10 yrs. ago, as a baseline   . Hyperlipidemia    takes Crestor daily  . Hypertension    takes Amlodipine daily and Lotensin as well  . Stroke [facility])     Past Surgical History:  Procedure Laterality Date  . ANTERIOR LAT LUMBAR FUSION Left 12/15/2014   Procedure: ANTERIOR LATERAL LUMBAR FUSION 1 LEVEL;  Surgeon: Phylliss Bob, MD;  Location: [place];  Service: Orthopedics;  Laterality: Left;  Left sided lateral lumbar interbody fusion, lumbar 3-4, posterior spinal fusion, lumbar 3-4 with instrumentation.  . COLONOSCOPY    . ESOPHAGOGASTRODUODENOSCOPY    . fatty tissue removed from stomach    . LUMBAR LAMINECTOMY/DECOMPRESSION MICRODISCECTOMY N/A 07/08/2013   Procedure: LUMBAR LAMINECTOMY/DECOMPRESSION MICRODISCECTOMY;  Surgeon: Sinclair Ship, MD;  Location: [place];  Service: Orthopedics;  Laterality: N/A;  Lumbar 3-4, lumbar  4-5 decompression  . RADIOLOGY WITH ANESTHESIA N/A 10/22/2019   Procedure: MRI WITH ANESTHESIA OF LUMBAR SPINE WITHOUT CONTRAST;  Surgeon: Radiologist, Medication, MD;  Location: [place];  Service: Radiology;  Laterality: N/A;  . right ankle surgery     as child   . THORACIC AORTIC ENDOVASCULAR STENT GRAFT N/A 10/15/2019   Procedure: THORACIC AORTIC ENDOVASCULAR STENT GRAFT;  Surgeon: Waynetta Sandy, MD;  Location: [place];  Service: Vascular;  Laterality: N/A;  . TONSILLECTOMY     as a child    Vitals:   02/29/20 1456  BP: (!) 147/84  Pulse: 60     Subjective Assessment - 02/29/20 1456    Subjective Pt reports that he is doing well. Back not really bothering him.    Currently in Pain? Yes    Pain Score 3     Pain Location Back    Pain Orientation Lower    Pain Descriptors / Indicators Aching    Pain Onset More than a month ago                             [facility] Adult PT Treatment/Exercise - 02/29/20 1509      Transfers   Transfers Sit to Stand;Stand to Sit    Sit to Stand 6: Modified independent (Device/Increase time)    Stand to Sit 6:  Modified independent (Device/Increase time)      Ambulation/Gait   Ambulation/Gait Yes    Ambulation/Gait Assistance 6: Modified independent (Device/Increase time)    Ambulation Distance (Feet) 400 Feet    Assistive device Rollator    Gait Pattern Step-through pattern    Ambulation Surface Level;Indoor      Neuro Re-ed    Neuro Re-ed Details  Step-ups on soft blue foam beam x 10 with 1 light UE support getting balance each time then side stepping along foam beam with 1 light UE support. Pt leaning posterior at times. Pt had less stability with stepping up with right foot. Standing on blue mat with 1 HHA alternating taps on cone x 10 each leg CGA/min assist. Sit to stand from low mat with blue mat under feet x 5. Verbal cues to lean forward and not pull back on legs.      Exercises   Exercises Other Exercises     Other Exercises  Side stepping  along front of // bar: side stepping without UE support 6' x 6, marching gait without UE support 6' x 6, mini-squats 10 x 2 with verbal cues for form.                     PT Short Term Goals - 02/25/20 1407      PT SHORT TERM GOAL #1   Title Pt will be independent with initial HEP for strength and flexibility and balance.    Baseline Pt denies any problems with initial exercises. Knows he needs to do more.    Time 4    Period Weeks    Status Achieved    Target Date 02/26/20      PT SHORT TERM GOAL #2   Title Pt will be instructed in back safety including proper body mechanics and positioning to better manage back pain and verbalize understanding.    Baseline Pt was able to demonstrate and verbalize proper body mechanics with bed mobility and lifting    Time 4    Period Weeks    Status Achieved    Target Date 02/26/20      PT SHORT TERM GOAL #3   Title Pt will be able to perform transfers sit to stand from varied surfaces mod I for improved mobility and functional strength.    Baseline mod I with sit to stand transfers with UE support.    Time 4    Period Weeks    Status Achieved    Target Date 02/26/20      PT SHORT TERM GOAL #4   Title Pt will ambulate with RW >300' on varied surfaces mod I for improved mobility.    Baseline 345' on level surfaces with rollator, supervision on nonlevel    Time 4    Period Weeks    Status Partially Met    Target Date 02/26/20      PT SHORT TERM GOAL #5   Title Patient will be able to perform 5x sit to stand without UE support to improve functional strength    Baseline 0 reps (02/01/20), 02/25/20 pt needs UE support to rise    Time 4    Period Weeks    Status Not Met    Target Date 02/29/20             PT Long Term Goals - 02/01/20 1825      PT LONG TERM GOAL #1   Title Pt will be independent in progressive  HEP for strength, balance and functional mobility to continue gains on own.     Baseline HEP issued 02/01/20    Time 8    Period Weeks    Status New      PT LONG TERM GOAL #2   Title Pt will increase gait speed from 0.8ms to >0.794m for improved community ambulation.    Baseline 0.5874mon 01/26/20    Time 8    Period Weeks    Status New      PT LONG TERM GOAL #3   Title Pt will decrease TUG from 29 sec to <24 sec for improved balance and functional strength to decrease fall risk.    Baseline 29 sec on 01/26/20    Time 8    Period Weeks    Status New      PT LONG TERM GOAL #4   Title Pt will ambulate >400' on varied surfaces with LRAD versus no AD for short community distances mod I.    Time 8    Period Weeks    Status New      PT LONG TERM GOAL #5   Title Pt will ambulate up/down a flight of stairs with 1 rail right mod I for improved safety in home.    Time 8    Period Weeks    Status New      PT LONG TERM GOAL #6   Title Pt will demo >50/56 on BBS to improve balance and reduce fall risk    Baseline 42/56 (02/01/20)    Time 8    Period Weeks    Status New                 Plan - 02/29/20 1807    Clinical Impression Statement PT continued to work on strengthening and balance. Pt showing improving strength with increased ease with sit to stand. He was challenged on compliant surfaces with increasing SLS time especially on right. Back did well with session.    Personal Factors and Comorbidities Comorbidity 3+    Comorbidities [age]. right-handed male with history of chronic back pain with lumbar discectomy 2014 as well as anterior lateral lumbar fusion 2016, hypertension, diabetes mellitus, hyperlipidemia and remote tobacco abuse.    Examination-Activity Limitations Bed Mobility;Transfers;Locomotion Level;Stairs    Examination-Participation Restrictions Community Activity;Driving;Cleaning;Interpersonal Relationship    Stability/Clinical Decision Making Evolving/Moderate complexity    Rehab Potential Good    PT Frequency 2x / week    PT Duration  8 weeks    PT Treatment/Interventions ADLs/Self Care Home Management;DME Instruction;Moist Heat;Cryotherapy;Gait training;Stair training;Functional mobility training;Therapeutic activities;Neuromuscular re-education;Balance training;Therapeutic exercise;Patient/family education;Manual techniques;Passive range of motion;Vestibular    PT Next Visit Plan 10th visit progress note next visit. Monitor vitals with activity. Continue with gait training with rollator on varied surfaces. Continue with more balance training and LE strengthening with focus on hips and hamstrings in more flexion based activities due to spinal stenosis. No supine SLR due to back issues and aortic dissection.    Consulted and Agree with Plan of Care Patient;Family member/caregiver    Family Member Consulted wife, LouBarbaraann Share        Patient will benefit from skilled therapeutic intervention in order to improve the following deficits and impairments:  Abnormal gait, Decreased activity tolerance, Decreased knowledge of use of DME, Decreased balance, Decreased endurance, Decreased range of motion, Decreased strength, Decreased mobility, Pain, Postural dysfunction, Impaired flexibility  Visit Diagnosis: Other abnormalities of gait and mobility  Muscle  weakness (generalized)     Problem List Patient Active Problem List   Diagnosis Date Noted  . 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 History of repair of dissecting thoracic aneurysm   . Abdominal distension   . Pulmonary embolus ([place]) 10/19/2019  . CVA (cerebral vascular accident) ([place]) 10/19/2019  . AKI (acute kidney injury) ([place]) 10/19/2019  . 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 Ileus ([place]) 10/19/2019  . 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 Aortic aneurysm with dissection ([place]) 10/08/2019  . Radiculopathy 12/15/2014    Electa Sniff,  PT, DPT, NCS 02/29/2020, 6:10 PM  [facility] [address] [place], [place], [address] Phone: [phone]   Fax:  [phone]  Name: [name] [name] MRN: [id number] Date of Birth: 10/13/1945

## 2020-02-29 NOTE — Therapy (Signed)
[facility] [address] [place], [place], [address] Phone: [phone]   Fax:  [phone]  Speech Language Pathology Treatment  Patient Details  Name: [name] [name] MRN: [id number] Date of Birth: Nov 18, 1945 Referring Provider (SLP): Dr. Letta Pate   Encounter Date: 02/29/2020   End of Session - 02/29/20 1658    Visit Number 8    Number of Visits 17    Date for SLP Re-Evaluation 04/20/20    Authorization Type UHC Medicare, $35 copay for all 3 disciplines on one day    SLP Start Time 1536    SLP Stop Time  1618    SLP Time Calculation (min) 42 min    Activity Tolerance Patient tolerated treatment well           Past Medical History:  Diagnosis Date  . Chronic back pain    stenosis  . Enlarged prostate   . GERD (gastroesophageal reflux disease)    occasionally will take a zantac(maybe once a month)  . History of colon polyps   . History of kidney stones   . History of stress test    done 10 yrs. ago, as a baseline   . Hyperlipidemia    takes Crestor daily  . Hypertension    takes Amlodipine daily and Lotensin as well  . Stroke [facility])     Past Surgical History:  Procedure Laterality Date  . ANTERIOR LAT LUMBAR FUSION Left 12/15/2014   Procedure: ANTERIOR LATERAL LUMBAR FUSION 1 LEVEL;  Surgeon: Phylliss Bob, MD;  Location: [place];  Service: Orthopedics;  Laterality: Left;  Left sided lateral lumbar interbody fusion, lumbar 3-4, posterior spinal fusion, lumbar 3-4 with instrumentation.  . COLONOSCOPY    . ESOPHAGOGASTRODUODENOSCOPY    . fatty tissue removed from stomach    . LUMBAR LAMINECTOMY/DECOMPRESSION MICRODISCECTOMY N/A 07/08/2013   Procedure: LUMBAR LAMINECTOMY/DECOMPRESSION MICRODISCECTOMY;  Surgeon: Sinclair Ship, MD;  Location: [facility];  Service: Orthopedics;  Laterality: N/A;  Lumbar 3-4, lumbar 4-5 decompression  . RADIOLOGY WITH ANESTHESIA N/A 10/22/2019   Procedure: MRI WITH ANESTHESIA  OF LUMBAR SPINE WITHOUT CONTRAST;  Surgeon: Radiologist, Medication, MD;  Location: [facility];  Service: Radiology;  Laterality: N/A;  . right ankle surgery     as child   . THORACIC AORTIC ENDOVASCULAR STENT GRAFT N/A 10/15/2019   Procedure: THORACIC AORTIC ENDOVASCULAR STENT GRAFT;  Surgeon: Waynetta Sandy, MD;  Location: [facility];  Service: Vascular;  Laterality: N/A;  . TONSILLECTOMY     as a child    There were no vitals filed for this visit.   Subjective Assessment - 02/29/20 1544    Subjective Pt asked SLP if his haircut wsa this week and pt opened his notebook and looked    Patient is accompained by: Family member   wife [name] [name]   Currently in Pain? Yes    Pain Score 3     Pain Location Back    Pain Orientation Lower    Pain Descriptors / Indicators Aching    Pain Type Chronic pain                 ADULT SLP TREATMENT - 02/29/20 1551      General Information   Behavior/Cognition Alert;Cooperative;Pleasant mood;Confused;Requires cueing;Decreased sustained attention      Treatment Provided   Treatment provided Cognitive-Linquistic      Cognitive-Linquistic Treatment   Treatment focused on Cognition    Skilled Treatment Homework completed for last week Thursday - nothing  Friday, Saturday or yesterday. Pt stated "I was busy doing other things." SLP provided rationale for homework "That was very helpful," pt stated. Pt had two appointments in weekly calendar for today and a haircut for Thursday, with therapy appointmetns today and Wednesday. To focus on attention and attentino to detail, SLP assisted pt with his homework for today - pt took extra time and req'd cues for emergent awareness 2/4 errors made. "I will do better (on my homeowk)" pt stated. Pt req'd min A rarely back to task, and extra time, to complete written directions task today, lasting 14 minutes. Pt refused to use phone alarm for reminder to do homework, but did put "speech homework" on his to-do list at  suggestion of SLP after pt stated, "I guess I'll just remember to do it" as his compensation.       Assessment / Recommendations / Plan   Plan Continue with current plan of care      Progression Toward Goals   Progression toward goals Not progressing toward goals (comment)   participation            SLP Education - 02/29/20 1657    Education Details rationale for homework    Darius(s) Educated Patient;Spouse    Methods Explanation    Comprehension Verbalized understanding;Need further instruction            SLP Short Term Goals - 02/25/20 1455      SLP SHORT TERM GOAL #1   Title pt will demo sustained attention for 10- minute functional task x 3, in 3 sessions    Baseline 02-16-20 02/18/20    Time --    Period --    Status Partially Met      SLP SHORT TERM GOAL #2   Title pt will demo the knowledge that he can use a memory system for daily events, ID'ing prospective and past events, medication management, and appointment tracking, etc x 3 sessions    Time --    Period --    Status Not Met      SLP SHORT TERM GOAL #3   Title pt will complete standardized cognitive linguistic testing by visit 4    Status Achieved      SLP SHORT TERM GOAL #4   Title pt will exhibit emergent awareness in simple cognitive linguistic tasks    Time --    Period --    Status Not Met            SLP Long Term Goals - 02/29/20 1659      SLP LONG TERM GOAL #1   Title pt will demo selective attention for 10 minutes to perform simple cognitive linguistic tasks in a min-mod noisy environment x2 sessions    Time 4    Period Weeks    Status On-going      SLP LONG TERM GOAL #2   Title pt will use memory system to record prospective or recall past events with occasional min A x 3 visits    Baseline 02-29-20    Time 4    Period Weeks    Status On-going      SLP LONG TERM GOAL #3   Title pt will use anomia compensations successfully in 10 minutes simple-mod complex conversation over 2  therapy sessions    Time 4    Period Weeks    Status On-going      SLP LONG TERM GOAL #4   Title pt will exhibit emergent awareness  in common min-mod complex household tasks with modified inedpendence in 3 sessions    Time 4    Period Weeks    Status On-going            Plan - 02/29/20 1658    Clinical Impression Statement [name] [name] presents with at least cont'd moderate cognitive deficits s/p CVA. Pt had history of cognitive deficits prior to CVA, and was undergoing w/u for dementia prior to hospitalization per MD notes. Prior infarct and white matter changes are noted on an MRI from 2011. 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 He managed finances and medications and helped with household chores prior to CVA, but wife is managing this now. I recommend skilled ST with emphasis on compensations to improve pt's functional recall and participation in simple household tasks.    Speech Therapy Frequency 2x / week    Duration --   8 weeks or 17 visits   Treatment/Interventions Compensatory strategies;Functional tasks;Patient/family education;Cognitive reorganization;Multimodal communcation approach;SLP instruction and feedback;Internal/external aids;Language facilitation;Compensatory techniques    Potential to Achieve Goals Fair    Potential Considerations Previous level of function    SLP Home Exercise Plan Get planner or binder for pt for memory notebook    Consulted and Agree with Plan of Care Patient;Family member/caregiver           Patient will benefit from skilled therapeutic intervention in order to improve the following deficits and impairments:   Cognitive communication deficit    Problem List Patient Active Problem List   Diagnosis Date Noted  . 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 History of repair of dissecting thoracic aneurysm   . Abdominal distension   . Pulmonary embolus ([facility])  10/19/2019  . CVA (cerebral vascular accident) ([facility]) 10/19/2019  . AKI (acute kidney injury) ([facility]) 10/19/2019  . 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 Ileus ([place]) 10/19/2019  . 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 Radiculopathy 12/15/2014    Jayln Madeira ,MS, CCC-SLP  02/29/2020, 5:00 PM  [facility] [address] [place], [place], [address] Phone: [phone]   Fax:  [phone]   Name: [name] [name] MRN: [id number] Date of Birth: [birth year]

## 2020-02-29 NOTE — Therapy (Signed)
[facility] [address] [place], [place], [address] Phone: [phone]   Fax:  [phone]  Occupational Therapy Treatment  Patient Details  Name: [name] [name] MRN: [id number] Date of Birth: Dec 31, 1945 No data recorded  Encounter Date: 02/29/2020   OT End of Session - 02/29/20 1607    Visit Number 5    Number of Visits 9    Date for OT Re-Evaluation 03/11/20    Authorization Type UHC Medicare    Progress Note Due on Visit 10    OT Start Time 1400    OT Stop Time 1445    OT Time Calculation (min) 45 min    Activity Tolerance Patient tolerated treatment well    Behavior During Therapy [facility] for tasks assessed/performed           Past Medical History:  Diagnosis Date  . Chronic back pain    stenosis  . Enlarged prostate   . GERD (gastroesophageal reflux disease)    occasionally will take a zantac(maybe once a month)  . History of colon polyps   . History of kidney stones   . History of stress test    done 10 yrs. ago, as a baseline   . Hyperlipidemia    takes Crestor daily  . Hypertension    takes Amlodipine daily and Lotensin as well  . Stroke [facility])     Past Surgical History:  Procedure Laterality Date  . ANTERIOR LAT LUMBAR FUSION Left 12/15/2014   Procedure: ANTERIOR LATERAL LUMBAR FUSION 1 LEVEL;  Surgeon: Phylliss Bob, MD;  Location: [facility];  Service: Orthopedics;  Laterality: Left;  Left sided lateral lumbar interbody fusion, lumbar 3-4, posterior spinal fusion, lumbar 3-4 with instrumentation.  . COLONOSCOPY    . ESOPHAGOGASTRODUODENOSCOPY    . fatty tissue removed from stomach    . LUMBAR LAMINECTOMY/DECOMPRESSION MICRODISCECTOMY N/A 07/08/2013   Procedure: LUMBAR LAMINECTOMY/DECOMPRESSION MICRODISCECTOMY;  Surgeon: Sinclair Ship, MD;  Location: [facility];  Service: Orthopedics;  Laterality: N/A;  Lumbar 3-4, lumbar 4-5 decompression  . RADIOLOGY WITH ANESTHESIA N/A 10/22/2019   Procedure: MRI  WITH ANESTHESIA OF LUMBAR SPINE WITHOUT CONTRAST;  Surgeon: Radiologist, Medication, MD;  Location: [facility];  Service: Radiology;  Laterality: N/A;  . right ankle surgery     as child   . THORACIC AORTIC ENDOVASCULAR STENT GRAFT N/A 10/15/2019   Procedure: THORACIC AORTIC ENDOVASCULAR STENT GRAFT;  Surgeon: Waynetta Sandy, MD;  Location: [facility];  Service: Vascular;  Laterality: N/A;  . TONSILLECTOMY     as a child    There were no vitals filed for this visit.   Subjective Assessment - 02/29/20 1411    Subjective  My back is a little better.  I still get a little twinge here and there - but its better since my epidural shot.    Patient is accompanied by: Family member    Currently in Pain? Yes    Pain Score 0-No pain                        OT Treatments/Exercises (OP) - 02/29/20 0001      ADLs   Functional Mobility Worked on sweeping task.  Patient able to walk short distances without his walker.  Able to stand and sweep and use standing dust pan.  Patient given the option to keep rollator close so if he needed seated rest break it was available.  Patient did not feel it would  be necessary to sit for sweeping task - and showed sufficient stand balance to complete in standing.        Visual/Perceptual Exercises   Other Exercises Patient reports some double vision since stroke.  He reports this seems to be resolving.  Completed MVPT as patient hopeful to retunr to driving.  Patient with significant delay to responses.  His responses were mostly accurate, but in some cases were more than 30 sec delayed.  Discussed this as it relates to driving, and indicated we would revisit closer to discharge, but at this time I would recommend a formal driving evaluation with a driving instructor..  Patient indicated he understood these recommnedations.  Wife also present this session.                    OT Education - 02/29/20 1606    Education Details discussed  recommendation to wait before considering driving, and at that point may recommend formal driving assessment.    Ruan(s) Educated Patient;Spouse    Methods Explanation    Comprehension Need further instruction               OT Long Term Goals - 02/25/20 1612      OT LONG TERM GOAL #1   Title Patient will complete an HEP to improve strength and coordination in non dominant LUE due 03/11/20    Status Achieved      OT LONG TERM GOAL #2   Title Patient will complete 9 hole peg test with no less than 5 second difference betweeen right (dominant) and left hand to aide with manipulation of small objects.    Status Achieved   Rt = 29.91 sec,  Lt = 33.82 sec     OT LONG TERM GOAL #3   Title Patient and his wife will demonstrate understanding of driving recommendations    Status On-going      OT LONG TERM GOAL #4   Title Patient will return to housekeeping - sweeping, dusting, vaccuuming without loss of balance, and with pain no greater than 6/10 back    Status On-going                 Plan - 02/29/20 1607    Clinical Impression Statement Patient with decreased memory, and unable to recall memory strategies from previous session.  Patient with significant delay to most verbal responses    OT Frequency 2x / week    OT Duration 4 weeks    OT Treatment/Interventions Self-care/ADL training;Therapeutic exercise;Visual/perceptual remediation/compensation;Patient/family education;Neuromuscular education;Moist Heat;Functional Furniture conservator/restorer;Therapeutic activities;Balance training;Manual Therapy;DME and/or AE instruction;Cryotherapy;Aquatic Therapy    Plan Work toward remaining LTG's    OT Home Exercise Plan putty exercises    Consulted and Agree with Plan of Care Patient;Family member/caregiver    Family Member Consulted wife - "weezy"           Patient will benefit from skilled therapeutic intervention in order to improve the following deficits and impairments:            Visit Diagnosis: Muscle weakness (generalized)  Attention and concentration deficit  Other lack of coordination  Unsteadiness on feet  Visuospatial deficit    Problem List Patient Active Problem List   Diagnosis Date Noted  . 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 History of repair of dissecting thoracic aneurysm   . Abdominal distension   . Pulmonary embolus ([place]) 10/19/2019  . CVA (cerebral vascular accident) ([place]) 10/19/2019  . AKI (  acute kidney injury) ([place]) 10/19/2019  . 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 Ileus ([place]) 10/19/2019  . 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 Radiculopathy 12/15/2014    Mariah Milling, OTR/L 02/29/2020, 4:09 PM  [facility] [address] [place], [place], [address] Phone: [phone]   Fax:  [phone]  Name: Chozen Latulippe Gosser MRN: [id number] Date of Birth: 1945-11-29

## 2020-02-29 NOTE — Patient Instructions (Signed)
  Please complete the assigned speech therapy homework prior to your next session and return it to the speech therapist at your next visit.  

## 2020-03-02 ENCOUNTER — Ambulatory Visit: Payer: Medicare Other | Admitting: Occupational Therapy

## 2020-03-02 ENCOUNTER — Ambulatory Visit: Payer: Medicare Other

## 2020-03-02 ENCOUNTER — Encounter: Payer: Self-pay | Admitting: Occupational Therapy

## 2020-03-02 ENCOUNTER — Other Ambulatory Visit: Payer: Self-pay

## 2020-03-02 DIAGNOSIS — M6281 Muscle weakness (generalized): Secondary | ICD-10-CM

## 2020-03-02 DIAGNOSIS — R41841 Cognitive communication deficit: Secondary | ICD-10-CM | POA: Diagnosis not present

## 2020-03-02 DIAGNOSIS — R2681 Unsteadiness on feet: Secondary | ICD-10-CM

## 2020-03-02 DIAGNOSIS — R41840 Attention and concentration deficit: Secondary | ICD-10-CM

## 2020-03-02 DIAGNOSIS — R2689 Other abnormalities of gait and mobility: Secondary | ICD-10-CM

## 2020-03-02 DIAGNOSIS — R278 Other lack of coordination: Secondary | ICD-10-CM

## 2020-03-02 DIAGNOSIS — R41842 Visuospatial deficit: Secondary | ICD-10-CM

## 2020-03-02 NOTE — Therapy (Signed)
[facility] [address] [place], [place], [address] Phone: [phone]   Fax:  [phone]  Physical Therapy Treatment  Patient Details  Name: [name] [name] MRN: [id number] Date of Birth: Jan 24, 1946 Referring Provider (PT): Alysia Penna  Physical Therapy Progress Note   Dates of Reporting Period: 01/26/2020 - 03/02/2020  See Note below for Objective Data and Assessment of Progress/Goals.  Thank you for the referral of this patient. Guillermina [place], PT, DPT   Encounter Date: 03/02/2020   PT End of Session - 03/02/20 1542    Visit Number 10    Number of Visits 17    Date for PT Re-Evaluation 04/26/20    Authorization Type Medicare payer so 10th visit progress note, FOTO    PT Start Time 1536   pt finishing up with speech   PT Stop Time 1616    PT Time Calculation (min) 40 min    Equipment Utilized During Treatment Gait belt    Activity Tolerance Patient tolerated treatment well    Behavior During Therapy WFL for tasks assessed/performed           Past Medical History:  Diagnosis Date  . Chronic back pain    stenosis  . Enlarged prostate   . GERD (gastroesophageal reflux disease)    occasionally will take a zantac(maybe once a month)  . History of colon polyps   . History of kidney stones   . History of stress test    done 10 yrs. ago, as a baseline   . Hyperlipidemia    takes Crestor daily  . Hypertension    takes Amlodipine daily and Lotensin as well  . Stroke [facility])     Past Surgical History:  Procedure Laterality Date  . ANTERIOR LAT LUMBAR FUSION Left 12/15/2014   Procedure: ANTERIOR LATERAL LUMBAR FUSION 1 LEVEL;  Surgeon: Phylliss Bob, MD;  Location: [facility];  Service: Orthopedics;  Laterality: Left;  Left sided lateral lumbar interbody fusion, lumbar 3-4, posterior spinal fusion, lumbar 3-4 with instrumentation.  . COLONOSCOPY    . ESOPHAGOGASTRODUODENOSCOPY    . fatty tissue removed from  stomach    . LUMBAR LAMINECTOMY/DECOMPRESSION MICRODISCECTOMY N/A 07/08/2013   Procedure: LUMBAR LAMINECTOMY/DECOMPRESSION MICRODISCECTOMY;  Surgeon: Sinclair Ship, MD;  Location: [facility];  Service: Orthopedics;  Laterality: N/A;  Lumbar 3-4, lumbar 4-5 decompression  . RADIOLOGY WITH ANESTHESIA N/A 10/22/2019   Procedure: MRI WITH ANESTHESIA OF LUMBAR SPINE WITHOUT CONTRAST;  Surgeon: Radiologist, Medication, MD;  Location: [facility];  Service: Radiology;  Laterality: N/A;  . right ankle surgery     as child   . THORACIC AORTIC ENDOVASCULAR STENT GRAFT N/A 10/15/2019   Procedure: THORACIC AORTIC ENDOVASCULAR STENT GRAFT;  Surgeon: Waynetta Sandy, MD;  Location: [facility];  Service: Vascular;  Laterality: N/A;  . TONSILLECTOMY     as a child    There were no vitals filed for this visit.   Subjective Assessment - 03/02/20 1541    Subjective Patient reporting no new complaints since last visit.    Patient is accompained by: Family member    Pertinent History 74 y.o. right-handed male with history of chronic back pain with lumbar discectomy 2014 as well as anterior lateral lumbar fusion 2016, hypertension, diabetes mellitus, hyperlipidemia and remote tobacco abuse.    Patient Stated Goals Pt would like to be able to have sex.    Currently in Pain? Yes    Pain Score 4     Pain  Location Back    Pain Orientation Lower    Pain Descriptors / Indicators Aching    Pain Type Chronic pain    Pain Onset More than a month ago                             [facility] Adult PT Treatment/Exercise - 03/02/20 0001      Transfers   Transfers Sit to Stand;Stand to Sit    Sit to Stand 6: Modified independent (Device/Increase time)    Stand to Sit 6: Modified independent (Device/Increase time)    Comments completed x 10 reps of sit <> stands with BLE standing on airex pad. increased difficulty maintaining balance on first repetition but able to demo improved completion with increasing  reps.      Ambulation/Gait   Ambulation/Gait Yes    Ambulation/Gait Assistance 6: Modified independent (Device/Increase time)    Ambulation/Gait Assistance Details throughout therapy session with activities    Assistive device Rollator    Gait Pattern Step-through pattern    Ambulation Surface Level;Indoor      High Level Balance   High Level Balance Activities Side stepping    High Level Balance Comments Completed side stepping in // bars with light UE support bars. Progressed to completing with no UE support x 1 lap in // bars, patient demo decreased step length, requiring verbal cues for improvement.       Neuro Re-ed    Neuro Re-ed Details  Standing on blue airex, completing alternating toe taps to cones with single light UE support, 1 x 10 reps. Progressed to completing cross over toe taps 1 x 10 reps. Intermittent CGA needed for steadying.                Balance Exercises - 03/02/20 0001      Balance Exercises: Standing   Standing Eyes Opened Narrow base of support (BOS);Head turns;Foam/compliant surface;Limitations    Standing Eyes Opened Limitations completed 1 x 10 reps of horizontal/vertical head turns. patient demo increased difficulty with horizontal > vertical head turns.      Standing Eyes Closed Narrow base of support (BOS);Foam/compliant surface;3 reps;Limitations;Time    Standing Eyes Closed Time <20 seconds    Standing Eyes Closed Limitations pt demo increased difficulty with eyes closed, increased ant/post sway noted.                PT Short Term Goals - 02/25/20 1407      PT SHORT TERM GOAL #1   Title Pt will be independent with initial HEP for strength and flexibility and balance.    Baseline Pt denies any problems with initial exercises. Knows he needs to do more.    Time 4    Period Weeks    Status Achieved    Target Date 02/26/20      PT SHORT TERM GOAL #2   Title Pt will be instructed in back safety including proper body mechanics and  positioning to better manage back pain and verbalize understanding.    Baseline Pt was able to demonstrate and verbalize proper body mechanics with bed mobility and lifting    Time 4    Period Weeks    Status Achieved    Target Date 02/26/20      PT SHORT TERM GOAL #3   Title Pt will be able to perform transfers sit to stand from varied surfaces mod I for improved mobility and functional strength.  Baseline mod I with sit to stand transfers with UE support.    Time 4    Period Weeks    Status Achieved    Target Date 02/26/20      PT SHORT TERM GOAL #4   Title Pt will ambulate with RW >300' on varied surfaces mod I for improved mobility.    Baseline 345' on level surfaces with rollator, supervision on nonlevel    Time 4    Period Weeks    Status Partially Met    Target Date 02/26/20      PT SHORT TERM GOAL #5   Title Patient will be able to perform 5x sit to stand without UE support to improve functional strength    Baseline 0 reps (02/01/20), 02/25/20 pt needs UE support to rise    Time 4    Period Weeks    Status Not Met    Target Date 02/29/20             PT Long Term Goals - 02/01/20 1825      PT LONG TERM GOAL #1   Title Pt will be independent in progressive HEP for strength, balance and functional mobility to continue gains on own.    Baseline HEP issued 02/01/20    Time 8    Period Weeks    Status New      PT LONG TERM GOAL #2   Title Pt will increase gait speed from 0.58ms to >0.710m for improved community ambulation.    Baseline 0.5830mon 01/26/20    Time 8    Period Weeks    Status New      PT LONG TERM GOAL #3   Title Pt will decrease TUG from 29 sec to <24 sec for improved balance and functional strength to decrease fall risk.    Baseline 29 sec on 01/26/20    Time 8    Period Weeks    Status New      PT LONG TERM GOAL #4   Title Pt will ambulate >400' on varied surfaces with LRAD versus no AD for short community distances mod I.    Time 8     Period Weeks    Status New      PT LONG TERM GOAL #5   Title Pt will ambulate up/down a flight of stairs with 1 rail right mod I for improved safety in home.    Time 8    Period Weeks    Status New      PT LONG TERM GOAL #6   Title Pt will demo >50/56 on BBS to improve balance and reduce fall risk    Baseline 42/56 (02/01/20)    Time 8    Period Weeks    Status New                 Plan - 03/02/20 1624    Clinical Impression Statement Continued today's skilled session working on balance with complaint surfaces today, with patient tolerating well. Pt conitnue to demo challenge with vision removed, complaint surfaces, and horizontal head turns. No increased in back pain with session. RLE (specifically knee) did fatigue with extended standing of balance exercises requiring extended rest break. Patient continues to demo good progress with PT services, and will continue to benefit from skilled PT services to progress toward goals.    Personal Factors and Comorbidities Comorbidity 3+    Comorbidities [age]o. right-handed male with history of chronic back pain with  lumbar discectomy 2014 as well as anterior lateral lumbar fusion 2016, hypertension, diabetes mellitus, hyperlipidemia and remote tobacco abuse.    Examination-Activity Limitations Bed Mobility;Transfers;Locomotion Level;Stairs    Examination-Participation Restrictions Community Activity;Driving;Cleaning;Interpersonal Relationship    Stability/Clinical Decision Making Evolving/Moderate complexity    Rehab Potential Good    PT Frequency 2x / week    PT Duration 8 weeks    PT Treatment/Interventions ADLs/Self Care Home Management;DME Instruction;Moist Heat;Cryotherapy;Gait training;Stair training;Functional mobility training;Therapeutic activities;Neuromuscular re-education;Balance training;Therapeutic exercise;Patient/family education;Manual techniques;Passive range of motion;Vestibular    PT Next Visit Plan Monitor vitals with  activity. Continue with gait training with rollator on varied surfaces. Continue with more balance training and LE strengthening with focus on hips and hamstrings in more flexion based activities due to spinal stenosis. No supine SLR due to back issues and aortic dissection.    Consulted and Agree with Plan of Care Patient;Family member/caregiver    Family Member Consulted wife, Barbaraann Share           Patient will benefit from skilled therapeutic intervention in order to improve the following deficits and impairments:  Abnormal gait, Decreased activity tolerance, Decreased knowledge of use of DME, Decreased balance, Decreased endurance, Decreased range of motion, Decreased strength, Decreased mobility, Pain, Postural dysfunction, Impaired flexibility  Visit Diagnosis: Other abnormalities of gait and mobility  Muscle weakness (generalized)  Unsteadiness on feet  Other lack of coordination     Problem List Patient Active Problem List   Diagnosis Date Noted  . 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 History of repair of dissecting thoracic aneurysm   . Abdominal distension   . Pulmonary embolus ([facility]) 10/19/2019  . CVA (cerebral vascular accident) ([facility]) 10/19/2019  . AKI (acute kidney injury) ([facility]) 10/19/2019  . 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 Ileus ([place]) 10/19/2019  . 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 Radiculopathy 12/15/2014    Jones Bales, PT, DPT 03/02/2020, 4:27 PM  [facility] [address] [place], [place], [address] Phone: [phone]   Fax:  [phone]  Name: Baran Kuhrt Mitzel MRN: [id number] Date of Birth: 1945-09-26

## 2020-03-02 NOTE — Therapy (Signed)
[facility] [address] [place], [place], [address] Phone: [phone]   Fax:  [phone]  Occupational Therapy Treatment  Patient Details  Name: [name] [name] MRN: [id number] Date of Birth: [birth year] No data recorded  Encounter Date: 03/02/2020   OT End of Session - 03/02/20 1718    Visit Number 6    Number of Visits 9    Date for OT Re-Evaluation 03/11/20    Authorization Type [employer] Medicare    Progress Note Due on Visit 10    OT Start Time 1405    OT Stop Time 1445    OT Time Calculation (min) 40 min    Activity Tolerance Patient tolerated treatment well    Behavior During Therapy [facility] for tasks assessed/performed           Past Medical History:  Diagnosis Date  . Chronic back pain    stenosis  . Enlarged prostate   . GERD (gastroesophageal reflux disease)    occasionally will take a zantac(maybe once a month)  . History of colon polyps   . History of kidney stones   . History of stress test    done 10 yrs. ago, as a baseline   . Hyperlipidemia    takes Crestor daily  . Hypertension    takes Amlodipine daily and Lotensin as well  . Stroke [facility])     Past Surgical History:  Procedure Laterality Date  . ANTERIOR LAT LUMBAR FUSION Left 12/15/2014   Procedure: ANTERIOR LATERAL LUMBAR FUSION 1 LEVEL;  Surgeon: Phylliss Bob, MD;  Location: [facility];  Service: Orthopedics;  Laterality: Left;  Left sided lateral lumbar interbody fusion, lumbar 3-4, posterior spinal fusion, lumbar 3-4 with instrumentation.  . COLONOSCOPY    . ESOPHAGOGASTRODUODENOSCOPY    . fatty tissue removed from stomach    . LUMBAR LAMINECTOMY/DECOMPRESSION MICRODISCECTOMY N/A 07/08/2013   Procedure: LUMBAR LAMINECTOMY/DECOMPRESSION MICRODISCECTOMY;  Surgeon: Sinclair Ship, MD;  Location: [facility];  Service: Orthopedics;  Laterality: N/A;  Lumbar 3-4, lumbar 4-5 decompression  . RADIOLOGY WITH ANESTHESIA N/A 10/22/2019   Procedure: MRI  WITH ANESTHESIA OF LUMBAR SPINE WITHOUT CONTRAST;  Surgeon: Radiologist, Medication, MD;  Location: [facility];  Service: Radiology;  Laterality: N/A;  . right ankle surgery     as child   . THORACIC AORTIC ENDOVASCULAR STENT GRAFT N/A 10/15/2019   Procedure: THORACIC AORTIC ENDOVASCULAR STENT GRAFT;  Surgeon: Waynetta Sandy, MD;  Location: [facility];  Service: Vascular;  Laterality: N/A;  . TONSILLECTOMY     as a child    There were no vitals filed for this visit.   Subjective Assessment - 03/02/20 1412    Subjective  My car is in the shop and needed a whole engine.    Patient is accompanied by: Family member    Currently in Pain? Yes                        OT Treatments/Exercises (OP) - 03/02/20 0001      ADLs   Driving Patient asking about driving - when am I ready to drive?  Stated very clearly that at this time I would not recommend him returning to driving based on very slow reaction time, and decreased memory.      Visual/Perceptual Exercises   Other Exercises Completed visual scanning at tabletop - to address both visual attention and scanning ability as alos to look at sustained and selective attention skills, and  abiity to follow written and verbal instructions - basic.  Patient challenged to attend to task with overt distractions.  Difficulty answering biographical questions while completing a scanning task.  When patient had sequential task , he was not successful as he did not scan fully to right, missing key componenets of task.  Even when cued, patient unable to scan to right .  Needed to physically point out errors on right side of page for patient to realize error.  When asked ifhe thought this had implications for driving - he was unable to draw any conclusion.  When told potential implications for driving - he could agree that this could be detrimental.                    OT Education - 03/02/20 1717    Education Details DIscussed recommendation  with patient and wife that he would need a doctor's clearance before considering driving, and at this time I would not recommend him returning to driving based on cognitive and visual deficits.  Wife in agreement    [name] Educated Patient;Spouse    Methods Explanation    Comprehension Need further instruction;Verbalized understanding               OT Long Term Goals - 02/25/20 1612      OT LONG TERM GOAL #1   Title Patient will complete an HEP to improve strength and coordination in non dominant LUE due 03/11/20    Status Achieved      OT LONG TERM GOAL #2   Title Patient will complete 9 hole peg test with no less than 5 second difference betweeen right (dominant) and left hand to aide with manipulation of small objects.    Status Achieved   Rt = 29.91 sec,  Lt = 33.82 sec     OT LONG TERM GOAL #3   Title Patient and his wife will demonstrate understanding of driving recommendations    Status On-going      OT LONG TERM GOAL #4   Title Patient will return to housekeeping - sweeping, dusting, vaccuuming without loss of balance, and with pain no greater than 6/10 back    Status On-going                 Plan - 03/02/20 1719    Clinical Impression Statement Patient has shown improvement in his functional mobility and use of UE's.  Patient is still limited by delayed processing time, and memory/awareness issues.    OT Frequency 2x / week    OT Duration 4 weeks    OT Treatment/Interventions Self-care/ADL training;Therapeutic exercise;Visual/perceptual remediation/compensation;Patient/family education;Neuromuscular education;Moist Heat;Functional Furniture conservator/restorer;Therapeutic activities;Balance training;Manual Therapy;DME and/or AE instruction;Cryotherapy;Aquatic Therapy    Plan Work toward remaining LTG's    OT Home Exercise Plan putty exercises    Consulted and Agree with Plan of Care Patient;Family member/caregiver    Family Member Consulted wife - "weezy"            Patient will benefit from skilled therapeutic intervention in order to improve the following deficits and impairments:           Visit Diagnosis: Unsteadiness on feet  Visuospatial deficit  Attention and concentration deficit  Other lack of coordination  Muscle weakness (generalized)    Problem List Patient Active Problem List   Diagnosis Date Noted  . 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 History of repair of dissecting thoracic aneurysm   . Abdominal distension   .  Pulmonary embolus ([place]) 10/19/2019  . CVA (cerebral vascular accident) ([place]) 10/19/2019  . AKI (acute kidney injury) ([place]) 10/19/2019  . 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 Ileus ([place]) 10/19/2019  . 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 Radiculopathy 12/15/2014    Mariah Milling , OTR/L 03/02/2020, 5:23 PM  [facility] [address] [place], [place], [address] Phone: [phone]   Fax:  [phone]  Name: [name] [name] MRN: [id number] Date of Birth: [birth year]

## 2020-03-02 NOTE — Therapy (Signed)
[facility] [address] [place], [place], [address] Phone: [phone]   Fax:  [phone]  Speech Language Pathology Treatment  Patient Details  Name: [name] [name] MRN: [id number] Date of Birth: 29-Nov-1945 Referring Provider (SLP): Dr. Letta Pate   Encounter Date: 03/02/2020   End of Session - 03/02/20 1703    Visit Number 9    Number of Visits 17    Date for SLP Re-Evaluation 04/20/20    Authorization Type UHC Medicare, $35 copay for all 3 disciplines on one day    SLP Start Time 1533    SLP Stop Time  1616    SLP Time Calculation (min) 43 min    Activity Tolerance Patient tolerated treatment well           Past Medical History:  Diagnosis Date  . Chronic back pain    stenosis  . Enlarged prostate   . GERD (gastroesophageal reflux disease)    occasionally will take a zantac(maybe once a month)  . History of colon polyps   . History of kidney stones   . History of stress test    done 10 yrs. ago, as a baseline   . Hyperlipidemia    takes Crestor daily  . Hypertension    takes Amlodipine daily and Lotensin as well  . Stroke [facility])     Past Surgical History:  Procedure Laterality Date  . ANTERIOR LAT LUMBAR FUSION Left 12/15/2014   Procedure: ANTERIOR LATERAL LUMBAR FUSION 1 LEVEL;  Surgeon: Phylliss Bob, MD;  Location: [facility];  Service: Orthopedics;  Laterality: Left;  Left sided lateral lumbar interbody fusion, lumbar 3-4, posterior spinal fusion, lumbar 3-4 with instrumentation.  . COLONOSCOPY    . ESOPHAGOGASTRODUODENOSCOPY    . fatty tissue removed from stomach    . LUMBAR LAMINECTOMY/DECOMPRESSION MICRODISCECTOMY N/A 07/08/2013   Procedure: LUMBAR LAMINECTOMY/DECOMPRESSION MICRODISCECTOMY;  Surgeon: Sinclair Ship, MD;  Location: [facility];  Service: Orthopedics;  Laterality: N/A;  Lumbar 3-4, lumbar 4-5 decompression  . RADIOLOGY WITH ANESTHESIA N/A 10/22/2019   Procedure: MRI WITH ANESTHESIA  OF LUMBAR SPINE WITHOUT CONTRAST;  Surgeon: Radiologist, Medication, MD;  Location: [facility];  Service: Radiology;  Laterality: N/A;  . right ankle surgery     as child   . THORACIC AORTIC ENDOVASCULAR STENT GRAFT N/A 10/15/2019   Procedure: THORACIC AORTIC ENDOVASCULAR STENT GRAFT;  Surgeon: Waynetta Sandy, MD;  Location: [facility];  Service: Vascular;  Laterality: N/A;  . TONSILLECTOMY     as a child    There were no vitals filed for this visit.   Subjective Assessment - 03/02/20 1456    Patient is accompained by: Family member   wife   Currently in Pain? Yes    Pain Score 3     Pain Location Back    Pain Orientation Lower    Pain Descriptors / Indicators Aching    Pain Type Chronic pain                 ADULT SLP TREATMENT - 03/02/20 1501      General Information   Behavior/Cognition Alert;Cooperative;Pleasant mood;Confused;Requires cueing;Decreased sustained attention      Treatment Provided   Treatment provided Cognitive-Linquistic      Cognitive-Linquistic Treatment   Treatment focused on Cognition    Skilled Treatment Pt took much extra time to answer what he did with OT today, sometimes up to 25-30 seconds to think of next task. Pt recalled the details of the  pickup of his car being repaired. SLP hinted at the homework that SLP provided however pt req'd direct questioning from SLP if he had homework. Pt req'd cues to look in his spot for [facility] in his binder (inside pocket) to see if he had any homework. In order to work with processing and attention SLP had pt list his grandchildren. Pt did so with extra time. Pt got 7/9 written in approx 1 minut 15 seconds and then req'd mod SLP cues to continue - when cued, pt spontaneously thought of each child and who their children were. In writing of great-grandchildren pt could not recall two/three names and after sitting for 2 1/2 minutes SLP cued pt to do the same strategy he did prior. He thought of one more but req'd SLP  cues to look at a picture to generate the third great-grandchild's name.Pt req'd cues for orientation of homework to do and completed. (front pocket, and behind "ST" tab, respectively).       Assessment / Recommendations / Plan   Plan Continue with current plan of care      Progression Toward Goals   Progression toward goals Progressing toward goals            SLP Education - 03/02/20 1703    Education Details pt should not drive    [place](s) Educated Patient;Spouse    Methods Explanation    Comprehension Verbalized understanding;Need further instruction            SLP Short Term Goals - 02/25/20 1455      SLP SHORT TERM GOAL #1   Title pt will demo sustained attention for 10- minute functional task x 3, in 3 sessions    Baseline 02-16-20 02/18/20    Time --    Period --    Status Partially Met      SLP SHORT TERM GOAL #2   Title pt will demo the knowledge that he can use a memory system for daily events, ID'ing prospective and past events, medication management, and appointment tracking, etc x 3 sessions    Time --    Period --    Status Not Met      SLP SHORT TERM GOAL #3   Title pt will complete standardized cognitive linguistic testing by visit 4    Status Achieved      SLP SHORT TERM GOAL #4   Title pt will exhibit emergent awareness in simple cognitive linguistic tasks    Time --    Period --    Status Not Met            SLP Long Term Goals - 03/02/20 1704      SLP LONG TERM GOAL #1   Title pt will demo selective attention for 10 minutes to perform simple cognitive linguistic tasks in a min-mod noisy environment x2 sessions    Time 4    Period Weeks    Status On-going      SLP LONG TERM GOAL #2   Title pt will use memory system to record prospective or recall past events with occasional min A x 3 visits    Baseline 02-29-20    Time 4    Period Weeks    Status On-going      SLP LONG TERM GOAL #3   Title pt will use anomia compensations successfully  in 10 minutes simple-mod complex conversation over 2 therapy sessions    Time 4    Period Weeks  Status On-going      SLP LONG TERM GOAL #4   Title pt will exhibit emergent awareness in common min-mod complex household tasks with modified inedpendence in 3 sessions    Time 4    Period Weeks    Status On-going            Plan - 03/02/20 1704    Clinical Impression Statement Mr. Muellner presents with at least cont'd moderate cognitive deficits s/p CVA. Pt had history of cognitive deficits prior to CVA, and was undergoing w/u for dementia prior to hospitalization per MD notes. Prior infarct and white matter changes are noted on an MRI from 2011. 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 He managed finances and medications and helped with household chores prior to CVA, but wife is managing this now. I recommend skilled ST with emphasis on compensations to improve pt's functional recall and participation in simple household tasks.    Speech Therapy Frequency 2x / week    Duration --   8 weeks or 17 visits   Treatment/Interventions Compensatory strategies;Functional tasks;Patient/family education;Cognitive reorganization;Multimodal communcation approach;SLP instruction and feedback;Internal/external aids;Language facilitation;Compensatory techniques    Potential to Achieve Goals Fair    Potential Considerations Previous level of function    SLP Home Exercise Plan Get planner or binder for pt for memory notebook    Consulted and Agree with Plan of Care Patient;Family member/caregiver           Patient will benefit from skilled therapeutic intervention in order to improve the following deficits and impairments:   Cognitive communication deficit    Problem List Patient Active Problem List   Diagnosis Date Noted  . 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 History of repair of dissecting thoracic aneurysm     . Abdominal distension   . Pulmonary embolus ([facility]) 10/19/2019  . CVA (cerebral vascular accident) ([place]) 10/19/2019  . AKI (acute kidney injury) ([facility]) 10/19/2019  . 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 Ileus ([place]) 10/19/2019  . 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 Radiculopathy 12/15/2014    Gionni Vaca ,MS, CCC-SLP  03/02/2020, 5:05 PM  [facility] [address] [place], [place], [address] Phone: [phone]   Fax:  [phone]   Name: [name] [name] MRN: [id number] Date of Birth: 01-03-1946

## 2020-03-02 NOTE — Patient Instructions (Signed)
  Please complete the assigned speech therapy homework prior to your next session and return it to the speech therapist at your next visit.  

## 2020-03-07 ENCOUNTER — Encounter: Payer: Self-pay | Admitting: Occupational Therapy

## 2020-03-07 ENCOUNTER — Other Ambulatory Visit: Payer: Self-pay

## 2020-03-07 ENCOUNTER — Ambulatory Visit: Payer: Medicare Other | Admitting: Occupational Therapy

## 2020-03-07 ENCOUNTER — Ambulatory Visit: Payer: Medicare Other

## 2020-03-07 DIAGNOSIS — R278 Other lack of coordination: Secondary | ICD-10-CM

## 2020-03-07 DIAGNOSIS — R2681 Unsteadiness on feet: Secondary | ICD-10-CM

## 2020-03-07 DIAGNOSIS — R41841 Cognitive communication deficit: Secondary | ICD-10-CM

## 2020-03-07 DIAGNOSIS — M6281 Muscle weakness (generalized): Secondary | ICD-10-CM

## 2020-03-07 DIAGNOSIS — R41840 Attention and concentration deficit: Secondary | ICD-10-CM

## 2020-03-07 DIAGNOSIS — R2689 Other abnormalities of gait and mobility: Secondary | ICD-10-CM

## 2020-03-07 DIAGNOSIS — R41842 Visuospatial deficit: Secondary | ICD-10-CM

## 2020-03-07 NOTE — Patient Instructions (Signed)
Local Driver Evaluation Programs: ° °Comprehensive Evaluation: includes clinical and in vehicle behind the wheel testing by OCCUPATIONAL THERAPIST. Programs have varying levels of adaptive controls available for trial.  ° °[facility] °[address] °[place], [place]  [address] °[phone] or [phone] °http://www.driver-rehab.com °Evaluator:  Cyndee Crompton, OT/CDRS/CDI/SCDCM/Low Vision Certification ° °[facility] °[address] °[place], [place] [address] °[phone] °https://www.[facility].org/home/services/rehabilitation.aspx °Evaluators:  Shannon Sheek, OT and Jill Tucker, OT ° °[facility] [place] [facility] (ONLY SERVES VETERANS!!) °[facility] °[address] °[place], [place]  [address] °[phone] [phone] °http://www.[place].va.gov/services/Physical_Medicine_Rehabilitation_Services.asp °Evaluators:  Eric Andrews, KT; Heidi Harris, KT;  Gary Whitaker, KT (KT=kiniesotherapist) ° ° °Clinical evaluations only:  Includes clinical testing, refers to other programs or local certified driving instructor for behind the wheel testing. ° °[facility] (outpatient Rehab) °[facility] °[address] °[place], [place] [address] °[phone] for scheduling °http://www.[facility].edu/Outpatient-Rehabilitation/Neurorehabilitation-Therapy.htm °Evaluators:  Kelly Lambeth, OT; Kate Phillips, OT ° °Other area clinical evaluators available upon request including [facility], [facility] and [facility]. ° ° °    Resource List °What is a Driver Evaluation: °Your Road Ahead - A Guide to Comprehensive Driving Evaluations °http://www.thehartford.com/resources/mature-market-excellence/publications-on-aging ° °Association for Driver Rehabilitation Services - Disability and Driving Fact Sheets °http://www.aded.net/?page=510 ° °Driving after a Brain  Injury: °[employer] °http://www.biausa.org/tbims-abstracts/if-there-is-an-effective-way-to-determine-if-someone-is-ready-to-drive-after-tbi?A=SearchResult&SearchID=9495675&ObjectID=2758842&ObjectType=35 ° °Driving with Adaptive Equipment: °[facility] Process °http://www.driver-rehab.com/adaptive-equipment ° °[employer] Mobility Equipment [employer] °http://www.nmeda.com/ ° ° ° ° ° ° °  °

## 2020-03-07 NOTE — Patient Instructions (Signed)
  Please complete the assigned speech therapy homework prior to your next session and return it to the speech therapist at your next visit.  

## 2020-03-07 NOTE — Therapy (Signed)
[facility] [address] [place], [place], [address] Phone: [phone]   Fax:  [phone]  Occupational Therapy Treatment and Discharge Summary  Patient Details  Name: [name] [name] MRN: [id number] Date of Birth: 1946-02-21 No data recorded  Encounter Date: 03/07/2020   OT End of Session - 03/07/20 1610    Visit Number 7    Number of Visits 9    Date for OT Re-Evaluation 03/11/20    Authorization Type UHC Medicare    Progress Note Due on Visit 10    OT Start Time 1325    OT Stop Time 1359    OT Time Calculation (min) 34 min    Activity Tolerance Patient tolerated treatment well    Behavior During Therapy [facility] for tasks assessed/performed           Past Medical History:  Diagnosis Date  . Chronic back pain    stenosis  . Enlarged prostate   . GERD (gastroesophageal reflux disease)    occasionally will take a zantac(maybe once a month)  . History of colon polyps   . History of kidney stones   . History of stress test    done 10 yrs. ago, as a baseline   . Hyperlipidemia    takes Crestor daily  . Hypertension    takes Amlodipine daily and Lotensin as well  . Stroke [facility])     Past Surgical History:  Procedure Laterality Date  . ANTERIOR LAT LUMBAR FUSION Left 12/15/2014   Procedure: ANTERIOR LATERAL LUMBAR FUSION 1 LEVEL;  Surgeon: Phylliss Bob, MD;  Location: [facility];  Service: Orthopedics;  Laterality: Left;  Left sided lateral lumbar interbody fusion, lumbar 3-4, posterior spinal fusion, lumbar 3-4 with instrumentation.  . COLONOSCOPY    . ESOPHAGOGASTRODUODENOSCOPY    . fatty tissue removed from stomach    . LUMBAR LAMINECTOMY/DECOMPRESSION MICRODISCECTOMY N/A 07/08/2013   Procedure: LUMBAR LAMINECTOMY/DECOMPRESSION MICRODISCECTOMY;  Surgeon: Sinclair Ship, MD;  Location: [facility];  Service: Orthopedics;  Laterality: N/A;  Lumbar 3-4, lumbar 4-5 decompression  . RADIOLOGY WITH ANESTHESIA N/A  10/22/2019   Procedure: MRI WITH ANESTHESIA OF LUMBAR SPINE WITHOUT CONTRAST;  Surgeon: Radiologist, Medication, MD;  Location: [facility];  Service: Radiology;  Laterality: N/A;  . right ankle surgery     as child   . THORACIC AORTIC ENDOVASCULAR STENT GRAFT N/A 10/15/2019   Procedure: THORACIC AORTIC ENDOVASCULAR STENT GRAFT;  Surgeon: Waynetta Sandy, MD;  Location: [facility];  Service: Vascular;  Laterality: N/A;  . TONSILLECTOMY     as a child    There were no vitals filed for this visit.   Subjective Assessment - 03/07/20 1334    Subjective  My back is a little problem.  The next epidural may help.    Patient is accompanied by: Family member    Pertinent History 10/08/19 aortic dissection w/ cerebellar stroke following sx. Back surgery 2014    Pain Score 4     Pain Location Back    Pain Orientation Lower    Pain Descriptors / Indicators Aching    Pain Type Chronic pain                        OT Treatments/Exercises (OP) - 03/07/20 0001      ADLs   Home Maintenance Patient has swept foyer in his hoime without difficulty.  Encouraged him to try vacuuming - he has a self propelled upright machine.  Instructed him to use back pain as a guide, and to really use self propel function.      Driving Again reviewed recommendations related to driving.  It is not recommended at this time that patient return to driving.  Gave resource sheet on driver evaluation, and encouraged patient to wait 3-6 months, and then consider formal driving evaluation with Scientist, research (physical sciences).  Reinforced reduced right visual scanning, and more importantly - slow response time.  Wife in agreement.  While discussing, aptient able to show understanding of recommendations.  He was able to state that he wanted to be safe when and if he did return to driving.  Unsure if patient will truly be able to integrate this information, so thankful to have his wife here as she is in agreement.        Visual/Perceptual  Exercises   Other Exercises Lrge print word search puzzle.  Patient familiar with this activity.                    OT Education - 03/07/20 1609    Education Details recommended waiting 3-6 months for healing, before considering any formal driving evaluation.  Do not recommend driving at this time    [place](s) Educated Patient;Spouse    Methods Explanation    Comprehension Verbalized understanding;Need further instruction               OT Long Term Goals - 03/07/20 1343      OT [place] #1   Title Patient will complete an HEP to improve strength and coordination in non dominant LUE due 03/11/20    Status Achieved      OT LONG TERM GOAL #2   Title Patient will complete 9 hole peg test with no less than 5 second difference betweeen right (dominant) and left hand to aide with manipulation of small objects.    Status Achieved      OT LONG TERM GOAL #3   Title Patient and his wife will demonstrate understanding of driving recommendations    Status Achieved      OT LONG TERM GOAL #4   Status Achieved                 Plan - 03/07/20 1610    Clinical Impression Statement Patient has met all goals and is agreeable to OT discharge.    OT Frequency 2x / week    OT Duration 4 weeks    OT Treatment/Interventions Self-care/ADL training;Therapeutic exercise;Visual/perceptual remediation/compensation;Patient/family education;Neuromuscular education;Moist Heat;Functional Furniture conservator/restorer;Therapeutic activities;Balance training;Manual Therapy;DME and/or AE instruction;Cryotherapy;Aquatic Therapy    Plan discharge    Recommended Other Services formal driver eval before attempting to drive    Consulted and Agree with Plan of Care Patient;Family member/caregiver    Family Member Consulted wife - "weezy"           Patient will benefit from skilled therapeutic intervention in order to improve the following deficits and impairments:           Visit  Diagnosis: Visuospatial deficit  Attention and concentration deficit  Muscle weakness (generalized)  Unsteadiness on feet  Other lack of coordination    Problem List Patient Active Problem List   Diagnosis Date Noted  . 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 History of repair of dissecting thoracic aneurysm   . Abdominal distension   . Pulmonary embolus ([place]) 10/19/2019  . CVA (cerebral vascular accident) ([place]) 10/19/2019  . AKI (acute kidney injury) (  [place]) 10/19/2019  . 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 Ileus ([place]) 10/19/2019  . Dissection of thoracoabdominal aorta ([place])   . 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 Radiculopathy 12/15/2014  OCCUPATIONAL THERAPY DISCHARGE SUMMARY  Visits from Start of Care: 7 Current functional level related to goals / functional outcomes: Independent/modified independent with ADL/IADL   Remaining deficits: Back pain, decreased memory, delayed reaction time, decreased right visual scanning  Education / Equipment: HEP Plan: Patient agrees to discharge.  Patient goals were partially met. Patient is being discharged due to meeting the stated rehab goals.  ?????        Mariah Milling , OTR/L 03/07/2020, 4:12 PM  [facility] [address] [place], [place], [address] Phone: [phone]   Fax:  [phone]  Name: Shamel Germond Alcaraz MRN: [id number] Date of Birth: 10-02-1945

## 2020-03-07 NOTE — Therapy (Signed)
[facility] [address] [place], [place], [address] Phone: [phone]   Fax:  [phone]  Speech Language Pathology Treatment/Progress note  Patient Details  Name: [name] [name] MRN: [id number] Date of Birth: [birth year] Referring Provider (SLP): Dr. Letta Pate   Encounter Date: 03/07/2020   End of Session - 03/07/20 1533    Visit Number 10    Number of Visits 17    Date for SLP Re-Evaluation 04/20/20    Authorization Type [employer] Medicare, $35 copay for all 3 disciplines on one day    SLP Start Time 1404    SLP Stop Time  1445    SLP Time Calculation (min) 41 min    Activity Tolerance Patient tolerated treatment well           Past Medical History:  Diagnosis Date  . Chronic back pain    stenosis  . Enlarged prostate   . GERD (gastroesophageal reflux disease)    occasionally will take a zantac(maybe once a month)  . History of colon polyps   . History of kidney stones   . History of stress test    done 10 yrs. ago, as a baseline   . Hyperlipidemia    takes Crestor daily  . Hypertension    takes Amlodipine daily and Lotensin as well  . Stroke [facility])     Past Surgical History:  Procedure Laterality Date  . ANTERIOR LAT LUMBAR FUSION Left 12/15/2014   Procedure: ANTERIOR LATERAL LUMBAR FUSION 1 LEVEL;  Surgeon: Phylliss Bob, MD;  Location: [facility];  Service: Orthopedics;  Laterality: Left;  Left sided lateral lumbar interbody fusion, lumbar 3-4, posterior spinal fusion, lumbar 3-4 with instrumentation.  . COLONOSCOPY    . ESOPHAGOGASTRODUODENOSCOPY    . fatty tissue removed from stomach    . LUMBAR LAMINECTOMY/DECOMPRESSION MICRODISCECTOMY N/A 07/08/2013   Procedure: LUMBAR LAMINECTOMY/DECOMPRESSION MICRODISCECTOMY;  Surgeon: Sinclair Ship, MD;  Location: [facility];  Service: Orthopedics;  Laterality: N/A;  Lumbar 3-4, lumbar 4-5 decompression  . RADIOLOGY WITH ANESTHESIA N/A 10/22/2019   Procedure: MRI  WITH ANESTHESIA OF LUMBAR SPINE WITHOUT CONTRAST;  Surgeon: Radiologist, Medication, MD;  Location: [facility];  Service: Radiology;  Laterality: N/A;  . right ankle surgery     as child   . THORACIC AORTIC ENDOVASCULAR STENT GRAFT N/A 10/15/2019   Procedure: THORACIC AORTIC ENDOVASCULAR STENT GRAFT;  Surgeon: Waynetta Sandy, MD;  Location: [facility];  Service: Vascular;  Laterality: N/A;  . TONSILLECTOMY     as a child    There were no vitals filed for this visit.   Subjective Assessment - 03/07/20 1406    Subjective "I was just released from - - (3 seconds) occupational therapy."    Patient is accompained by: Family member   wife-Weezie   Currently in Pain? Yes    Pain Score 4     Pain Location Back    Pain Orientation Lower    Pain Descriptors / Indicators Aching    Pain Type Chronic pain    Pain Onset More than a month ago    Pain Frequency Constant                 ADULT SLP TREATMENT - 03/07/20 1407      General Information   Behavior/Cognition Alert;Pleasant mood;Cooperative      Treatment Provided   Treatment provided Cognitive-Linquistic      Cognitive-Linquistic Treatment   Treatment focused on Cognition    Skilled Treatment "  Let's see this is Monday - (10 seconds as pt looking at weekly calendar) it was before my haircut - was it Wednesday?" (pt, re when he was here last). SLP asked pt about his schedule other than therapy and pt provided two other appointments and then shared therapy appointmetns - SLP reminded pt about "other than therapy", and pt back to task without further cues necessary for remaining 40 seconds of the task. SLP req'd to provide cues for pt for homework. Pt req'd cues to recall he thought it would be more organized to put completed homework in the "ST" section. In attempts to have pt use his calendar for "to-do's" pt had difficulty figuring how to compensate for alternating attention to return to [place] homework when he takes a break due to  mental fatigue. Pt to write "ST homework" on his to-do listin his spiral notebook. SLP asked pt simple calendar/scheduling questions re: gaps in therapy schedule - pt req'd mod cues from SLP to explain this concept; SLP then provied max cues necessary to problem solve/reason how to solve the probelm of gaps in the schedule.       Assessment / Recommendations / Plan   Plan Continue with current plan of care      Progression Toward Goals   Progression toward goals Progressing toward goals              SLP Short Term Goals - 02/25/20 1455      SLP SHORT TERM GOAL #1   Title pt will demo sustained attention for 10- minute functional task x 3, in 3 sessions    Baseline 02-16-20 02/18/20    Time --    Period --    Status Partially Met      SLP SHORT TERM GOAL #2   Title pt will demo the knowledge that he can use a memory system for daily events, ID'ing prospective and past events, medication management, and appointment tracking, etc x 3 sessions    Time --    Period --    Status Not Met      SLP SHORT TERM GOAL #3   Title pt will complete standardized cognitive linguistic testing by visit 4    Status Achieved      SLP SHORT TERM GOAL #4   Title pt will exhibit emergent awareness in simple cognitive linguistic tasks    Time --    Period --    Status Not Met            SLP Long Term Goals - 03/07/20 1534      SLP LONG TERM GOAL #1   Title pt will demo selective attention for 10 minutes to perform simple cognitive linguistic tasks in a min-mod noisy environment x2 sessions    Time 3    Period Weeks    Status On-going      SLP LONG TERM GOAL #2   Title pt will use memory system to record prospective or recall past events with occasional min A x 3 visits    Baseline 02-29-20    Time 3    Period Weeks    Status On-going      SLP LONG TERM GOAL #3   Title pt will use anomia compensations successfully in 10 minutes simple-mod complex conversation over 2 therapy sessions     Time 3    Period Weeks    Status On-going      SLP LONG TERM GOAL #4   Title pt  will exhibit emergent awareness in common min-mod complex household tasks with modified inedpendence in 3 sessions    Time 3    Period Weeks    Status On-going            Plan - 03/07/20 1534    Clinical Impression Statement [name] presents with at least cont'd moderate cognitive deficits s/p CVA. Pt had history of cognitive deficits prior to CVA, and was undergoing w/u for dementia prior to hospitalization per MD notes. Prior infarct and white matter changes are noted on an MRI from 2011. 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 He managed finances and medications and helped with household chores prior to CVA, but wife is managing this now. I recommend skilled ST with emphasis on compensations to improve pt's functional recall and participation in simple household tasks.    Speech Therapy Frequency 2x / week    Duration --   8 weeks or 17 visits   Treatment/Interventions Compensatory strategies;Functional tasks;Patient/family education;Cognitive reorganization;Multimodal communcation approach;SLP instruction and feedback;Internal/external aids;Language facilitation;Compensatory techniques    Potential to Achieve Goals Fair    Potential Considerations Previous level of function    SLP Home Exercise Plan Get planner or binder for pt for memory notebook    Consulted and Agree with Plan of Care Patient;Family member/caregiver           Patient will benefit from skilled therapeutic intervention in order to improve the following deficits and impairments:   Cognitive communication deficit   Speech Therapy Progress Note  Dates of Reporting Period: 01-21-20 to present  Objective Reports of Subjective Statement: Pt has been seen for 10 sessions cogntive linguistic therapy focusing on cognition.  Objective Measurements: Please see above note.  Goal Update:  Please see goals above.  Plan: Cont to see pt at least 7 more sessions  Reason Skilled Services are Required: Pt has not yet reached max rehab potential.   Problem List Patient Active Problem List   Diagnosis Date Noted  . 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 History of repair of dissecting thoracic aneurysm   . Abdominal distension   . Pulmonary embolus ([place] [facility]) 10/19/2019  . CVA (cerebral vascular accident) ([facility] [place]) 10/19/2019  . AKI (acute kidney injury) ([name]) 10/19/2019  . 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 Ileus ([place]) 10/19/2019  . 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 Radiculopathy 12/15/2014    [facility] ,MS, CCC-SLP  03/07/2020, 3:35 PM  [facility] [address] [place], [place], [address] Phone: [phone]   Fax:  [phone]   Name: [name] [name] MRN: [id number] Date of Birth: Nov 22, 1945

## 2020-03-07 NOTE — Therapy (Signed)
[facility] [address] [place], [place], [address] Phone: [phone]   Fax:  [phone]  Physical Therapy Treatment  Patient Details  Name: [name] [name] MRN: [id number] Date of Birth: 10/08/1945 Referring Provider (PT): Alysia Penna   Encounter Date: 03/07/2020   PT End of Session - 03/07/20 1455    Visit Number 11    Number of Visits 17    Date for PT Re-Evaluation 04/26/20    Authorization Type Medicare payer so 10th visit progress note, FOTO    PT Start Time 1452   running behind from ST   PT Stop Time 1530    PT Time Calculation (min) 38 min    Equipment Utilized During Treatment Gait belt    Activity Tolerance Patient tolerated treatment well    Behavior During Therapy WFL for tasks assessed/performed           Past Medical History:  Diagnosis Date   Chronic back pain    stenosis   Enlarged prostate    GERD (gastroesophageal reflux disease)    occasionally will take a zantac(maybe once a month)   History of colon polyps    History of kidney stones    History of stress test    done 10 yrs. ago, as a baseline    Hyperlipidemia    takes Crestor daily   Hypertension    takes Amlodipine daily and Lotensin as well   Stroke [facility])     Past Surgical History:  Procedure Laterality Date   ANTERIOR LAT LUMBAR FUSION Left 12/15/2014   Procedure: ANTERIOR LATERAL LUMBAR FUSION 1 LEVEL;  Surgeon: Phylliss Bob, MD;  Location: [facility];  Service: Orthopedics;  Laterality: Left;  Left sided lateral lumbar interbody fusion, lumbar 3-4, posterior spinal fusion, lumbar 3-4 with instrumentation.   COLONOSCOPY     ESOPHAGOGASTRODUODENOSCOPY     fatty tissue removed from stomach     LUMBAR LAMINECTOMY/DECOMPRESSION MICRODISCECTOMY N/A 07/08/2013   Procedure: LUMBAR LAMINECTOMY/DECOMPRESSION MICRODISCECTOMY;  Surgeon: Sinclair Ship, MD;  Location: [facility];  Service: Orthopedics;  Laterality:  N/A;  Lumbar 3-4, lumbar 4-5 decompression   RADIOLOGY WITH ANESTHESIA N/A 10/22/2019   Procedure: MRI WITH ANESTHESIA OF LUMBAR SPINE WITHOUT CONTRAST;  Surgeon: Radiologist, Medication, MD;  Location: [facility];  Service: Radiology;  Laterality: N/A;   right ankle surgery     as child    THORACIC AORTIC ENDOVASCULAR STENT GRAFT N/A 10/15/2019   Procedure: THORACIC AORTIC ENDOVASCULAR STENT GRAFT;  Surgeon: Waynetta Sandy, MD;  Location: [facility];  Service: Vascular;  Laterality: N/A;   TONSILLECTOMY     as a child    There were no vitals filed for this visit.   Subjective Assessment - 03/07/20 1455    Subjective Pt reports that he is doing ok. His back has been bothering him some.    Patient is accompained by: Family member    Pertinent History 74 y.o. right-handed male with history of chronic back pain with lumbar discectomy 2014 as well as anterior lateral lumbar fusion 2016, hypertension, diabetes mellitus, hyperlipidemia and remote tobacco abuse.    Patient Stated Goals Pt would like to be able to have sex.    Currently in Pain? Yes    Pain Score 5     Pain Location Back    Pain Orientation Lower    Pain Descriptors / Indicators Aching    Pain Type Chronic pain    Pain Onset More than a month ago  Pain Frequency Constant                             [facility] Adult PT Treatment/Exercise - 03/07/20 1459      Ambulation/Gait   Ambulation/Gait Yes    Ambulation/Gait Assistance 5: Supervision;4: Min guard    Ambulation/Gait Assistance Details Pt was cued to work on increasing step length. Pt denied any increased back pain.    Ambulation Distance (Feet) 115 Feet    Assistive device None    Gait Pattern Step-through pattern;Trendelenburg;Decreased step length - right;Decreased step length - left    Ambulation Surface Level;Indoor    Stairs Yes    Stairs Assistance 6: Modified independent (Device/Increase time);5: Supervision    Stairs Assistance Details  (indicate cue type and reason) Pt reported some depth perception issues with descent. Needed to use step-to pattern on descent.    Stair Management Technique One rail Right;Alternating pattern;Step to pattern    Number of Stairs 12      Standardized Balance Assessment   Standardized Balance Assessment Berg Balance Test      Berg Balance Test   Sit to Stand Able to stand without using hands and stabilize independently    Standing Unsupported Able to stand safely 2 minutes    Sitting with Back Unsupported but Feet Supported on Floor or Stool Able to sit safely and securely 2 minutes    Stand to Sit Sits safely with minimal use of hands    Transfers Able to transfer safely, minor use of hands    Standing Unsupported with Eyes Closed Able to stand 10 seconds with supervision    Standing Ubsupported with Feet Together Able to place feet together independently and stand 1 minute safely    From Standing, Reach Forward with Outstretched Arm Can reach forward >12 cm safely (5")    From Standing Position, Pick up Object from Floor Able to pick up shoe, needs supervision    From Standing Position, Turn to Look Behind Over each Shoulder Turn sideways only but maintains balance    Turn 360 Degrees Able to turn 360 degrees safely but slowly    Standing Unsupported, Alternately Place Feet on Step/Stool Able to complete 4 steps without aid or supervision    Standing Unsupported, One Foot in Front Able to take small step independently and hold 30 seconds    Standing on One Leg Tries to lift leg/unable to hold 3 seconds but remains standing independently    Total Score 42      Neuro Re-ed    Neuro Re-ed Details  Standing on airex without UE support x 30 sec then marching in place with light UE support x 10. Pt reporting increased back pain so sat to help calm down.                    PT Short Term Goals - 02/25/20 1407      PT SHORT TERM GOAL #1   Title Pt will be independent with initial HEP  for strength and flexibility and balance.    Baseline Pt denies any problems with initial exercises. Knows he needs to do more.    Time 4    Period Weeks    Status Achieved    Target Date 02/26/20      PT SHORT TERM GOAL #2   Title Pt will be instructed in back safety including proper body mechanics and positioning to better manage  back pain and verbalize understanding.    Baseline Pt was able to demonstrate and verbalize proper body mechanics with bed mobility and lifting    Time 4    Period Weeks    Status Achieved    Target Date 02/26/20      PT SHORT TERM GOAL #3   Title Pt will be able to perform transfers sit to stand from varied surfaces mod I for improved mobility and functional strength.    Baseline mod I with sit to stand transfers with UE support.    Time 4    Period Weeks    Status Achieved    Target Date 02/26/20      PT SHORT TERM GOAL #4   Title Pt will ambulate with RW >300' on varied surfaces mod I for improved mobility.    Baseline 345' on level surfaces with rollator, supervision on nonlevel    Time 4    Period Weeks    Status Partially Met    Target Date 02/26/20      PT SHORT TERM GOAL #5   Title Patient will be able to perform 5x sit to stand without UE support to improve functional strength    Baseline 0 reps (02/01/20), 02/25/20 pt needs UE support to rise    Time 4    Period Weeks    Status Not Met    Target Date 02/29/20             PT Long Term Goals - 03/07/20 1511      PT LONG TERM GOAL #1   Title Pt will be independent in progressive HEP for strength, balance and functional mobility to continue gains on own.    Baseline HEP issued 02/01/20    Time 8    Period Weeks    Status New      PT LONG TERM GOAL #2   Title Pt will increase gait speed from 0.50ms to >0.774m for improved community ambulation.    Baseline 0.5849mon 01/26/20    Time 8    Period Weeks    Status New      PT LONG TERM GOAL #3   Title Pt will decrease TUG from 29  sec to <24 sec for improved balance and functional strength to decrease fall risk.    Baseline 29 sec on 01/26/20    Time 8    Period Weeks    Status New      PT LONG TERM GOAL #4   Title Pt will ambulate >400' on varied surfaces with LRAD versus no AD for short community distances mod I.    Time 8    Period Weeks    Status New      PT LONG TERM GOAL #5   Title Pt will ambulate up/down a flight of stairs with 1 rail right mod I for improved safety in home.    Time 8    Period Weeks    Status New      PT LONG TERM GOAL #6   Title Pt will demo >50/56 on BBS to improve balance and reduce fall risk    Baseline 42/56 (02/01/20), 03/07/20 Berg=42/56    Time 8    Period Weeks    Status New                 Plan - 03/07/20 1749    Clinical Impression Statement Pt continues to show improving functional strength as able to rise from mat  without UE support today. No change in [place] since last assessed still indicating fall risk. PT broke up activities to allow back to rest.    Personal Factors and Comorbidities Comorbidity 3+    Comorbidities 74 y.o. right-handed male with history of chronic back pain with lumbar discectomy 2014 as well as anterior lateral lumbar fusion 2016, hypertension, diabetes mellitus, hyperlipidemia and remote tobacco abuse.    Examination-Activity Limitations Bed Mobility;Transfers;Locomotion Level;Stairs    Examination-Participation Restrictions Community Activity;Driving;Cleaning;Interpersonal Relationship    Stability/Clinical Decision Making Evolving/Moderate complexity    Rehab Potential Good    PT Frequency 2x / week    PT Duration 8 weeks    PT Treatment/Interventions ADLs/Self Care Home Management;DME Instruction;Moist Heat;Cryotherapy;Gait training;Stair training;Functional mobility training;Therapeutic activities;Neuromuscular re-education;Balance training;Therapeutic exercise;Patient/family education;Manual techniques;Passive range of motion;Vestibular      PT Next Visit Plan I had pt schedule 1 more week to end of cert. Will need to decide if more needed end of next week to try to keep on schedule further if so. Monitor vitals with activity. Continue with gait training with rollator on varied surfaces. Continue with more balance training and LE strengthening with focus on hips and hamstrings in more flexion based activities due to spinal stenosis. No supine SLR due to back issues and aortic dissection.    Consulted and Agree with Plan of Care Patient;Family member/caregiver    Family Member Consulted wife, Barbaraann Share           Patient will benefit from skilled therapeutic intervention in order to improve the following deficits and impairments:  Abnormal gait, Decreased activity tolerance, Decreased knowledge of use of DME, Decreased balance, Decreased endurance, Decreased range of motion, Decreased strength, Decreased mobility, Pain, Postural dysfunction, Impaired flexibility  Visit Diagnosis: Other abnormalities of gait and mobility  Muscle weakness (generalized)     Problem List Patient Active Problem List   Diagnosis Date Noted   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   History of repair of dissecting thoracic aneurysm    Abdominal distension    Pulmonary embolus ([place]) 10/19/2019   CVA (cerebral vascular accident) ([place]) 10/19/2019   AKI (acute kidney injury) ([place]) 10/19/2019   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 [place], [place], [address] Phone: [phone]   Fax:  [phone]  Name: [name] [name] MRN:  [id number] Date of Birth: 30-Apr-1946

## 2020-03-08 ENCOUNTER — Encounter: Payer: Self-pay | Admitting: Physical Medicine & Rehabilitation

## 2020-03-08 ENCOUNTER — Encounter: Payer: Medicare Other | Attending: Registered Nurse | Admitting: Physical Medicine & Rehabilitation

## 2020-03-08 ENCOUNTER — Other Ambulatory Visit: Payer: Self-pay

## 2020-03-08 VITALS — BP 175/85 | HR 56 | Temp 98.6°F | Ht 71.0 in | Wt 191.0 lb

## 2020-03-08 DIAGNOSIS — Z8673 Personal history of transient ischemic attack (TIA), and cerebral infarction without residual deficits: Secondary | ICD-10-CM | POA: Diagnosis present

## 2020-03-08 DIAGNOSIS — I69398 Other sequelae of cerebral infarction: Secondary | ICD-10-CM

## 2020-03-08 DIAGNOSIS — Z8679 Personal history of other diseases of the circulatory system: Secondary | ICD-10-CM | POA: Insufficient documentation

## 2020-03-08 DIAGNOSIS — Z9889 Other specified postprocedural states: Secondary | ICD-10-CM | POA: Insufficient documentation

## 2020-03-08 DIAGNOSIS — I69319 Unspecified symptoms and signs involving cognitive functions following cerebral infarction: Secondary | ICD-10-CM | POA: Diagnosis not present

## 2020-03-08 DIAGNOSIS — I7103 Dissection of thoracoabdominal aorta: Secondary | ICD-10-CM | POA: Diagnosis present

## 2020-03-08 DIAGNOSIS — I10 Essential (primary) hypertension: Secondary | ICD-10-CM | POA: Diagnosis present

## 2020-03-08 DIAGNOSIS — H539 Unspecified visual disturbance: Secondary | ICD-10-CM

## 2020-03-08 DIAGNOSIS — E7849 Other hyperlipidemia: Secondary | ICD-10-CM | POA: Insufficient documentation

## 2020-03-08 DIAGNOSIS — R269 Unspecified abnormalities of gait and mobility: Secondary | ICD-10-CM | POA: Diagnosis not present

## 2020-03-08 NOTE — Patient Instructions (Signed)
[employer] games website  No driving

## 2020-03-08 NOTE — Progress Notes (Signed)
Subjective:   right-handed male with history of chronic back pain with lumbar discectomy 2014 as well as anterior lateral lumbar fusion 2016, hypertension, diabetes mellitus, hyperlipidemia and remote tobacco abuse.  Per chart review lives with spouse independent prior to admission.  Presented 10/08/2019 with lower chest and back pain with epigastric discomfort times several weeks as well as bouts of nausea and vomiting.  In the ED blood pressure 180s/130s.  EKG showed nonspecific ST-T changes not significantly different from prior EKG 2016.  Troponin high-sensitivity 28, potassium 3.3, creatinine 1.25, hemoglobin 14.5, SARS coronavirus negative.  Chest x-ray no active disease.  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  Small pulmonary embolus involving the central right lobe pulmonary artery as well as probable additional small subsegmental embolus in the right lower lobe.  Echocardiogram with ejection fraction 65%.  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 Donzetta Matters.  Placed on intravenous heparin therapy.  Neurology consulted 10/17/2019 for altered mental status.  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  Carotid Dopplers with no ICA stenosis.  Neurology follow-up heparin was transitioned to Eliquis.  Hospital course blood loss anemia 9.3 follow-up GI services there was some fear of possible GI bleed related to intravenous heparin as well as transition to Eliquis no overt bleeding noted hemoglobin hematocrit remained stable no further work-up indicated.   Patient ID: [name] [name], male    DOB: 07/17/1946, 74 y.o.   MRN: [id number]  HPI 74 year old male who returns for 38-month follow-up visit.  He has continued to receive outpatient stroke rehabilitation with PT OT and speech.   His OT has been discontinued.  He is independent with his ADLs.  His OT continues to be concerned about reaction times as well as some visual problems and does not feel like he is ready for driving .  The patient is scheduled for outpatient PT and speech therapy.  The patient acknowledges that cognitive issues are his main concern after the stroke.  He feels that physically he is improving.  We discussed how his cognitive issues may impact on driving abilities he agrees that he is ready for driving yet.  Uses [employer] at home and RW when in community  The patient denies falling since the last visit 2 months ago.  He is accompanied by his wife who also states he has not fallen. Pain Inventory Average Pain 6 Pain Right Now 4 My pain is dull  In the last 24 hours, has pain interfered with the following? General activity 5 Relation with others 5 Enjoyment of life 5 What TIME of day is your pain at its worst? morning Sleep (in general) Good  Pain is worse with: walking and some activites Pain improves with: ? Relief from Meds: no pain meds  Mobility walk with assistance use a walker ability to climb steps?  yes do you drive?  no  Function retired  Neuro/Psych weakness trouble walking  Prior Studies Any changes since last visit?  no  Physicians involved in your care Any changes since last visit?  no   History reviewed. No pertinent family history. Social History   Socioeconomic History  . Marital status: Married    Spouse name: Not on file  . Number of children: Not on file  . Years of education: Not on file  . Highest education  level: Not on file  Occupational History  . Not on file  Tobacco Use  . Smoking status: Former Smoker    Packs/day: 0.25    Years: 40.00    Pack years: 10.00    Types: Cigarettes  . Smokeless tobacco: Never Used  . Tobacco comment: none since 06/24/13  Vaping Use  . Vaping Use: Never used  Substance and Sexual Activity  . Alcohol use: No    . Drug use: No  . Sexual activity: Not on file  Other Topics Concern  . Not on file  Social History Narrative  . Not on file   Social Determinants of Health   Financial Resource Strain:   . Difficulty of Paying Living Expenses:   Food Insecurity:   . Worried About Charity fundraiser in the Last Year:   . Arboriculturist in the Last Year:   Transportation Needs:   . Film/video editor (Medical):   Marland Kitchen Lack of Transportation (Non-Medical):   Physical Activity:   . Days of Exercise per Week:   . Minutes of Exercise per Session:   Stress:   . Feeling of Stress :   Social Connections:   . Frequency of Communication with Friends and Family:   . Frequency of Social Gatherings with Friends and Family:   . Attends Religious Services:   . Active Member of Clubs or Organizations:   . Attends Archivist Meetings:   Marland Kitchen Marital Status:    Past Surgical History:  Procedure Laterality Date  . ANTERIOR LAT LUMBAR FUSION Left 12/15/2014   Procedure: ANTERIOR LATERAL LUMBAR FUSION 1 LEVEL;  Surgeon: Phylliss Bob, MD;  Location: [facility];  Service: Orthopedics;  Laterality: Left;  Left sided lateral lumbar interbody fusion, lumbar 3-4, posterior spinal fusion, lumbar 3-4 with instrumentation.  . COLONOSCOPY    . ESOPHAGOGASTRODUODENOSCOPY    . fatty tissue removed from stomach    . LUMBAR LAMINECTOMY/DECOMPRESSION MICRODISCECTOMY N/A 07/08/2013   Procedure: LUMBAR LAMINECTOMY/DECOMPRESSION MICRODISCECTOMY;  Surgeon: Sinclair Ship, MD;  Location: [facility] [facility];  Service: Orthopedics;  Laterality: N/A;  Lumbar 3-4, lumbar 4-5 decompression  . RADIOLOGY WITH ANESTHESIA N/A 10/22/2019   Procedure: MRI WITH ANESTHESIA OF LUMBAR SPINE WITHOUT CONTRAST;  Surgeon: Radiologist, Medication, MD;  Location: [facility];  Service: Radiology;  Laterality: N/A;  . right ankle surgery     as child   . THORACIC AORTIC ENDOVASCULAR STENT GRAFT N/A 10/15/2019   Procedure: THORACIC AORTIC ENDOVASCULAR STENT  GRAFT;  Surgeon: Waynetta Sandy, MD;  Location: [facility];  Service: Vascular;  Laterality: N/A;  . TONSILLECTOMY     as a child   Past Medical History:  Diagnosis Date  . Chronic back pain    stenosis  . Enlarged prostate   . GERD (gastroesophageal reflux disease)    occasionally will take a zantac(maybe once a month)  . History of colon polyps   . History of kidney stones   . History of stress test    done 10 yrs. ago, as a baseline   . Hyperlipidemia    takes Crestor daily  . Hypertension    takes Amlodipine daily and Lotensin as well  . Stroke ([place])    BP (!) 175/85   Pulse (!) 56   Temp 98.6 F (37 C)   Ht 5\' 11"  (1.803 m)   Wt 191 lb (86.6 kg)   SpO2 97%   BMI 26.64 kg/m   Opioid Risk Score:   Fall  Risk Score:  `1  Depression screen PHQ 2/9  Depression screen PHQ 2/9 11/24/2019  Decreased Interest 2  Down, Depressed, Hopeless 1  PHQ - 2 Score 3  Altered sleeping 0  Tired, decreased energy 1  Change in appetite 0  Feeling bad or failure about yourself  1  Trouble concentrating 2  Moving slowly or fidgety/restless 1  Suicidal thoughts 0  PHQ-9 Score 8    Review of Systems  Constitutional: Negative.   HENT: Negative.   Eyes: Negative.   Respiratory: Negative.   Cardiovascular: Negative.   Gastrointestinal: Negative.   Endocrine:       High blood sugar  Genitourinary: Negative.   Musculoskeletal: Positive for gait problem.  Skin: Negative.   Allergic/Immunologic: Negative.   Neurological: Positive for weakness. Dizziness: , daytime.  Hematological: Negative.   Psychiatric/Behavioral: Negative.   All other systems reviewed and are negative.      Objective:   Physical Exam Vitals and nursing note reviewed.  Constitutional:      Appearance: Normal appearance.  Eyes:     Extraocular Movements: Extraocular movements intact.     Conjunctiva/sclera: Conjunctivae normal.     Pupils: Pupils are equal, round, and reactive to light.  Skin:     General: Skin is warm and dry.  Neurological:     General: No focal deficit present.     Mental Status: He is alert and oriented to [place], place, and time.     Comments: Visual fields are intact to confrontation testing. There is diplopia with right lateral gaze.  No evidence of nystagmus Motor strength is 5/5 bilateral deltoid, bicep, tricep, grip, hip, knee extensor, ankle dorsiflexor There is no evidence of dysmetria on finger-nose-finger testing or heel shin testing The patient is unable to stand with his feet together and eyes closed. He is able ambulate without his walker very slow step length with widened base of support. Speech is without dysarthria Cognition Oriented x3 Remembers 2 out of 3 objects after 2 minutes  Serial sevens to 93 only Unable to spell world backwards but  Psychiatric:        Mood and Affect: Mood normal.        Behavior: Behavior normal.        Thought Content: Thought content normal.     Lateral gaze right with horz diplopia       Assessment & Plan:  1.  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 Continue outpatient PT and therapy No driving will discuss at next visit Discussed importance of performing home exercise program as well as "homework" for speech therapy.  We discussed websites such as luminosity I will see him back in 2 months

## 2020-03-09 ENCOUNTER — Ambulatory Visit: Payer: Medicare Other | Admitting: Occupational Therapy

## 2020-03-09 ENCOUNTER — Ambulatory Visit: Payer: Medicare Other

## 2020-03-09 DIAGNOSIS — R2689 Other abnormalities of gait and mobility: Secondary | ICD-10-CM

## 2020-03-09 DIAGNOSIS — R41841 Cognitive communication deficit: Secondary | ICD-10-CM

## 2020-03-09 DIAGNOSIS — M6281 Muscle weakness (generalized): Secondary | ICD-10-CM

## 2020-03-09 NOTE — Therapy (Signed)
[facility] [address] [place], [place], [address] Phone: [phone]   Fax:  [phone]  Speech Language Pathology Treatment  Patient Details  Name: [name] [name] MRN: [id number] Date of Birth: [birth year] Referring Provider (SLP): Dr. Letta [name]   Encounter Date: 03/09/2020   End of Session - 03/09/20 1703    Visit Number 11    Number of Visits 17    Date for SLP Re-Evaluation 04/20/20    SLP Start Time 1406    SLP Stop Time  1446    SLP Time Calculation (min) 40 min    Activity Tolerance Patient tolerated treatment well           Past Medical History:  Diagnosis Date  . Chronic back pain    stenosis  . Enlarged prostate   . GERD (gastroesophageal reflux disease)    occasionally will take a zantac(maybe once a month)  . History of colon polyps   . History of kidney stones   . History of stress test    done 10 yrs. ago, as a baseline   . Hyperlipidemia    takes Crestor daily  . Hypertension    takes Amlodipine daily and Lotensin as well  . Stroke [facility])     Past Surgical History:  Procedure Laterality Date  . ANTERIOR LAT LUMBAR FUSION Left 12/15/2014   Procedure: ANTERIOR LATERAL LUMBAR FUSION 1 LEVEL;  Surgeon: [name] Bob, MD;  Location: [facility];  Service: Orthopedics;  Laterality: Left;  Left sided lateral lumbar interbody fusion, lumbar 3-4, posterior spinal fusion, lumbar 3-4 with instrumentation.  . COLONOSCOPY    . ESOPHAGOGASTRODUODENOSCOPY    . fatty tissue removed from stomach    . LUMBAR LAMINECTOMY/DECOMPRESSION MICRODISCECTOMY N/A 07/08/2013   Procedure: LUMBAR LAMINECTOMY/DECOMPRESSION MICRODISCECTOMY;  Surgeon: [name] Ship, MD;  Location: [facility];  Service: Orthopedics;  Laterality: N/A;  Lumbar 3-4, lumbar 4-5 decompression  . RADIOLOGY WITH ANESTHESIA N/A 10/22/2019   Procedure: MRI WITH ANESTHESIA OF LUMBAR SPINE WITHOUT CONTRAST;  Surgeon: [name] [name], Medication, MD;   Location: [facility];  Service: Radiology;  Laterality: N/A;  . right ankle surgery     as child   . THORACIC AORTIC ENDOVASCULAR STENT GRAFT N/A 10/15/2019   Procedure: THORACIC AORTIC ENDOVASCULAR STENT GRAFT;  Surgeon: [name] Sandy, MD;  Location: [facility];  Service: Vascular;  Laterality: N/A;  . TONSILLECTOMY     as a child    There were no vitals filed for this visit.   Subjective Assessment - 03/09/20 1413    Subjective "Saw Dr. Edison [name] ([name] [name]) yesterday." Pt recalled a call from Dr. Deforest [name] office that his blood pressure seemed a little high so pt stated NP said he needed to watch it.    Currently in Pain? Yes    Pain Score 4     Pain Location Back    Pain Orientation Lower    Pain Descriptors / Indicators Aching   "I look forward to the epidural shot sometime in the near future."   Pain Type Chronic pain                 ADULT SLP TREATMENT - 03/09/20 1415      General Information   Behavior/Cognition Alert;Pleasant mood;Cooperative      Treatment Provided   Treatment provided Cognitive-Linquistic      Cognitive-Linquistic Treatment   Treatment focused on Cognition    Skilled Treatment SLP needed to cue pt to retrieve his homework after SLP  sitting quietly for 25 seconds (nonverbal cues). Pt took extra time to locate his homework and req'd total A to reason through how to figure out what the homework was since last session (look at weekly schedule sheet), and pt req'd total A to find last ST session on his weekly schedule. SLP placed note for where homework to do is located and that he should place finished homework behind the tabs. Pt placed homework in the corect place at end of session, but demo'd decr'd initiation when leaving ST room was apparent (after 15 seconds of SLP and wife standing up)- pt req'd cues to leave.      Assessment / Recommendations / Plan   Plan Continue with current plan of care      Progression Toward Goals   Progression toward goals  Progressing toward goals              SLP Short Term Goals - 02/25/20 1455      SLP SHORT TERM GOAL #1   Title pt will demo sustained attention for 10- minute functional task x 3, in 3 sessions    Baseline 02-16-20 02/18/20    Time --    Period --    Status Partially Met      SLP SHORT TERM GOAL #2   Title pt will demo the knowledge that he can use a memory system for daily events, ID'ing prospective and past events, [name] [name] management, and appointment tracking, etc x 3 sessions    Time --    Period --    Status Not Met      SLP SHORT TERM GOAL #3   Title pt will complete standardized cognitive linguistic testing by visit 4    Status Achieved      SLP SHORT TERM GOAL #4   Title pt will exhibit emergent awareness in simple cognitive linguistic tasks    Time --    Period --    Status Not Met            SLP Long Term Goals - 03/09/20 1703      SLP LONG TERM GOAL #1   Title pt will demo selective attention for 10 minutes to perform simple cognitive linguistic tasks in a min-mod noisy environment x2 sessions    Time 3    Period Weeks    Status On-going      SLP LONG TERM GOAL #2   Title pt will use memory system to record prospective or recall past events with occasional min A x 3 visits    Baseline 02-29-20    Time 3    Period Weeks    Status On-going      SLP LONG TERM GOAL #3   Title pt will use anomia compensations successfully in 10 minutes simple-mod complex conversation over 2 therapy sessions    Time 3    Period Weeks    Status On-going      SLP LONG TERM GOAL #4   Title pt will exhibit emergent awareness in common min-mod complex household tasks with modified inedpendence in 3 sessions    Time 3    Period Weeks    Status On-going            Plan - 03/09/20 1703    Clinical Impression Statement [name] [name] presents with at least cont'd moderate cognitive deficits s/p CVA. Pt had history of cognitive deficits prior to CVA, and was undergoing w/u  for dementia prior to hospitalization per MD  notes. Prior infarct and white matter changes are noted on an MRI from 2011. 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 He managed finances and medications and helped with household chores prior to CVA, but wife is managing this now. I recommend skilled ST with emphasis on compensations to improve pt's functional recall and participation in simple household tasks.    Speech Therapy Frequency 2x / week    Duration --   8 weeks or 17 visits   Treatment/Interventions Compensatory strategies;Functional tasks;Patient/family education;Cognitive reorganization;Multimodal communcation approach;SLP instruction and feedback;Internal/external aids;Language facilitation;Compensatory techniques    Potential to Achieve Goals Fair    Potential Considerations Previous level of function    SLP Home Exercise Plan Get planner or binder for pt for memory notebook    Consulted and Agree with Plan of Care Patient;Family member/caregiver           Patient will benefit from skilled therapeutic intervention in order to improve the following deficits and impairments:   Cognitive communication deficit    Problem List Patient Active Problem List   Diagnosis Date Noted  . 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 History of repair of dissecting thoracic aneurysm   . Abdominal distension   . Pulmonary embolus ([facility]) 10/19/2019  . CVA (cerebral vascular accident) ([facility]) 10/19/2019  . AKI (acute kidney injury) ([place] [facility]) 10/19/2019  . 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 Ileus ([facility]) 10/19/2019  . Dissection of thoracoabdominal aorta ([facility])   . Aortic aneurysm with dissection ([place]) 10/08/2019  . Radiculopathy 12/15/2014    [facility] ,MS, CCC-SLP  03/09/2020, 5:04 PM  [facility] [address] [place], [place], [address] Phone: [phone]   Fax:  [phone]   Name: [name] [name] MRN: [id number] Date of Birth: 05-16-1946

## 2020-03-09 NOTE — Therapy (Signed)
[facility] [address] [place], [place], [address] Phone: [phone]   Fax:  [phone]  Physical Therapy Treatment  Patient Details  Name: [name] [name] MRN: [id number] Date of Birth: 1946-01-15 Referring Provider (PT): Alysia Penna   Encounter Date: 03/09/2020   PT End of Session - 03/09/20 1539    Visit Number 12    Number of Visits 17    Date for PT Re-Evaluation 04/26/20    Authorization Type Medicare payer so 10th visit progress note, FOTO    PT Start Time 1534    PT Stop Time 1615    PT Time Calculation (min) 41 min    Equipment Utilized During Treatment Gait belt    Activity Tolerance Patient tolerated treatment well    Behavior During Therapy [facility] for tasks assessed/performed           Past Medical History:  Diagnosis Date  . Chronic back pain    stenosis  . Enlarged prostate   . GERD (gastroesophageal reflux disease)    occasionally will take a zantac(maybe once a month)  . History of colon polyps   . History of kidney stones   . History of stress test    done 10 yrs. ago, as a baseline   . Hyperlipidemia    takes Crestor daily  . Hypertension    takes Amlodipine daily and Lotensin as well  . Stroke [facility])     Past Surgical History:  Procedure Laterality Date  . ANTERIOR LAT LUMBAR FUSION Left 12/15/2014   Procedure: ANTERIOR LATERAL LUMBAR FUSION 1 LEVEL;  Surgeon: Phylliss Bob, MD;  Location: [place];  Service: Orthopedics;  Laterality: Left;  Left sided lateral lumbar interbody fusion, lumbar 3-4, posterior spinal fusion, lumbar 3-4 with instrumentation.  . COLONOSCOPY    . ESOPHAGOGASTRODUODENOSCOPY    . fatty tissue removed from stomach    . LUMBAR LAMINECTOMY/DECOMPRESSION MICRODISCECTOMY N/A 07/08/2013   Procedure: LUMBAR LAMINECTOMY/DECOMPRESSION MICRODISCECTOMY;  Surgeon: Sinclair Ship, MD;  Location: [place];  Service: Orthopedics;  Laterality: N/A;  Lumbar 3-4, lumbar  4-5 decompression  . RADIOLOGY WITH ANESTHESIA N/A 10/22/2019   Procedure: MRI WITH ANESTHESIA OF LUMBAR SPINE WITHOUT CONTRAST;  Surgeon: Radiologist, Medication, MD;  Location: [place];  Service: Radiology;  Laterality: N/A;  . right ankle surgery     as child   . THORACIC AORTIC ENDOVASCULAR STENT GRAFT N/A 10/15/2019   Procedure: THORACIC AORTIC ENDOVASCULAR STENT GRAFT;  Surgeon: Waynetta Sandy, MD;  Location: [place];  Service: Vascular;  Laterality: N/A;  . TONSILLECTOMY     as a child    There were no vitals filed for this visit.   Subjective Assessment - 03/09/20 1539    Subjective Pt reports that back is about the same. Is able to reposition in the bed easier.    Patient is accompained by: Family member    Pertinent History 74 y.o. right-handed male with history of chronic back pain with lumbar discectomy 2014 as well as anterior lateral lumbar fusion 2016, hypertension, diabetes mellitus, hyperlipidemia and remote tobacco abuse.    Patient Stated Goals Pt would like to be able to have sex.    Currently in Pain? Yes    Pain Score 4     Pain Location Back    Pain Orientation Lower    Pain Descriptors / Indicators Aching    Pain Type Chronic pain    Pain Onset More than a month ago  Pain Frequency Constant                             [facility] Adult PT Treatment/Exercise - 03/09/20 1540      Ambulation/Gait   Ambulation/Gait Yes    Ambulation/Gait Assistance 6: Modified independent (Device/Increase time)    Ambulation/Gait Assistance Details Pt was cued to stay close to walker for upright posture.    Ambulation Distance (Feet) 600 Feet    Assistive device Rollator    Gait Pattern Step-through pattern    Ambulation Surface Level;Unlevel;Outdoor;Indoor;Paved    Curb 6: Modified independent (Device/increase time)    Curb Details (indicate cue type and reason) with rollator      Neuro Re-ed    Neuro Re-ed Details  In // bars on blue mat: side  stepping with only fingertip support with verbal cues for upright posture and to tighten tummy to support back 6' x 6, marching forwards and then backwards steps with light 1 UE support 6' x 6. Mini-squats on blue mat 10 x 2 with verbal cues for form.      Exercises   Exercises Other Exercises;Knee/Hip    Other Exercises  Standing hamstring curl x 10 bilateral      Knee/Hip Exercises: Aerobic   Other Aerobic SciFit level 4.0 x 5 min. Prior to performance HR=68. After performance HR=64, BP=144/78                    PT Short Term Goals - 02/25/20 1407      PT SHORT TERM GOAL #1   Title Pt will be independent with initial HEP for strength and flexibility and balance.    Baseline Pt denies any problems with initial exercises. Knows he needs to do more.    Time 4    Period Weeks    Status Achieved    Target Date 02/26/20      PT SHORT TERM GOAL #2   Title Pt will be instructed in back safety including proper body mechanics and positioning to better manage back pain and verbalize understanding.    Baseline Pt was able to demonstrate and verbalize proper body mechanics with bed mobility and lifting    Time 4    Period Weeks    Status Achieved    Target Date 02/26/20      PT SHORT TERM GOAL #3   Title Pt will be able to perform transfers sit to stand from varied surfaces mod I for improved mobility and functional strength.    Baseline mod I with sit to stand transfers with UE support.    Time 4    Period Weeks    Status Achieved    Target Date 02/26/20      PT SHORT TERM GOAL #4   Title Pt will ambulate with RW >300' on varied surfaces mod I for improved mobility.    Baseline 345' on level surfaces with rollator, supervision on nonlevel    Time 4    Period Weeks    Status Partially Met    Target Date 02/26/20      PT SHORT TERM GOAL #5   Title Patient will be able to perform 5x sit to stand without UE support to improve functional strength    Baseline 0 reps  (02/01/20), 02/25/20 pt needs UE support to rise    Time 4    Period Weeks    Status Not Met  Target Date 02/29/20             PT Long Term Goals - 03/07/20 1511      PT LONG TERM GOAL #1   Title Pt will be independent in progressive HEP for strength, balance and functional mobility to continue gains on own.    Baseline HEP issued 02/01/20    Time 8    Period Weeks    Status New      PT LONG TERM GOAL #2   Title Pt will increase gait speed from 0.62ms to >0.754m for improved community ambulation.    Baseline 0.5843mon 01/26/20    Time 8    Period Weeks    Status New      PT LONG TERM GOAL #3   Title Pt will decrease TUG from 29 sec to <24 sec for improved balance and functional strength to decrease fall risk.    Baseline 29 sec on 01/26/20    Time 8    Period Weeks    Status New      PT LONG TERM GOAL #4   Title Pt will ambulate >400' on varied surfaces with LRAD versus no AD for short community distances mod I.    Time 8    Period Weeks    Status New      PT LONG TERM GOAL #5   Title Pt will ambulate up/down a flight of stairs with 1 rail right mod I for improved safety in home.    Time 8    Period Weeks    Status New      PT LONG TERM GOAL #6   Title Pt will demo >50/56 on BBS to improve balance and reduce fall risk    Baseline 42/56 (02/01/20), 03/07/20 [place]=42/56    Time 8    Period Weeks    Status New                 Plan - 03/09/20 2024    Clinical Impression Statement Pt able to increase gait distance outside with rollator. Did well with no LOB. PT continued to work on balance and strengthening. No increase in back pain with activities.    Personal Factors and Comorbidities Comorbidity 3+    Comorbidities [age]o. right-handed male with history of chronic back pain with lumbar discectomy 2014 as well as anterior lateral lumbar fusion 2016, hypertension, diabetes mellitus, hyperlipidemia and remote tobacco abuse.    Examination-Activity Limitations Bed  Mobility;Transfers;Locomotion Level;Stairs    Examination-Participation Restrictions Community Activity;Driving;Cleaning;Interpersonal Relationship    Stability/Clinical Decision Making Evolving/Moderate complexity    Rehab Potential Good    PT Frequency 2x / week    PT Duration 8 weeks    PT Treatment/Interventions ADLs/Self Care Home Management;DME Instruction;Moist Heat;Cryotherapy;Gait training;Stair training;Functional mobility training;Therapeutic activities;Neuromuscular re-education;Balance training;Therapeutic exercise;Patient/family education;Manual techniques;Passive range of motion;Vestibular    PT Next Visit Plan Will need to decide if more needed end of next week to try to keep on schedule further if so. Monitor vitals with activity. Continue with gait training with rollator on varied surfaces. Continue with more balance training and LE strengthening with focus on hips and hamstrings in more flexion based activities due to spinal stenosis. No supine SLR due to back issues and aortic dissection.    Consulted and Agree with Plan of Care Patient;Family member/caregiver    Family Member Consulted wife, LouBarbaraann Share        Patient will benefit from skilled therapeutic intervention in  order to improve the following deficits and impairments:  Abnormal gait, Decreased activity tolerance, Decreased knowledge of use of DME, Decreased balance, Decreased endurance, Decreased range of motion, Decreased strength, Decreased mobility, Pain, Postural dysfunction, Impaired flexibility  Visit Diagnosis: Other abnormalities of gait and mobility  Muscle weakness (generalized)     Problem List Patient Active Problem List   Diagnosis Date Noted  . 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 History of repair of dissecting thoracic aneurysm   . Abdominal distension   . Pulmonary embolus ([place]) 10/19/2019  . CVA (cerebral vascular accident) ([place]) 10/19/2019  . AKI  (acute kidney injury) ([place]) 10/19/2019  . 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 Ileus ([place]) 10/19/2019  . 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 Radiculopathy 12/15/2014    Electa Sniff, PT, DPT, NCS 03/09/2020, 8:27 PM  [facility] [address] [place], [place], [address] Phone: [phone]   Fax:  [phone]  Name: [name] [name] MRN: [id number] Date of Birth: [birth year]

## 2020-03-09 NOTE — Patient Instructions (Signed)
  Please complete the assigned speech therapy homework prior to your next session and return it to the speech therapist at your next visit.  

## 2020-03-10 ENCOUNTER — Ambulatory Visit: Payer: Medicare Other | Admitting: Podiatry

## 2020-03-10 ENCOUNTER — Encounter: Payer: Self-pay | Admitting: Podiatry

## 2020-03-10 ENCOUNTER — Other Ambulatory Visit: Payer: Self-pay

## 2020-03-10 DIAGNOSIS — M2042 Other hammer toe(s) (acquired), left foot: Secondary | ICD-10-CM

## 2020-03-10 DIAGNOSIS — E1140 Type 2 diabetes mellitus with diabetic neuropathy, unspecified: Secondary | ICD-10-CM | POA: Diagnosis not present

## 2020-03-10 DIAGNOSIS — M2041 Other hammer toe(s) (acquired), right foot: Secondary | ICD-10-CM

## 2020-03-10 DIAGNOSIS — M2012 Hallux valgus (acquired), left foot: Secondary | ICD-10-CM

## 2020-03-10 DIAGNOSIS — M21612 Bunion of left foot: Secondary | ICD-10-CM

## 2020-03-10 DIAGNOSIS — M79674 Pain in right toe(s): Secondary | ICD-10-CM

## 2020-03-10 DIAGNOSIS — B351 Tinea unguium: Secondary | ICD-10-CM

## 2020-03-10 DIAGNOSIS — M21611 Bunion of right foot: Secondary | ICD-10-CM

## 2020-03-10 DIAGNOSIS — M79675 Pain in left toe(s): Secondary | ICD-10-CM

## 2020-03-10 DIAGNOSIS — M2011 Hallux valgus (acquired), right foot: Secondary | ICD-10-CM

## 2020-03-10 NOTE — Progress Notes (Signed)
  Subjective:  Patient ID: [name] [name], male    DOB: 1946-04-08,  MRN: [id number]  Chief Complaint  Patient presents with  . Diabetes    A1C  6.7, diabetic foot care  . Nail Problem    thick painful toenails    74 y.o. male presents with the above complaint. History confirmed with patient. Recently had an aneurysm and stroke, progressing with rehab for this. Strength improving on LLE.  Objective:  Physical Exam: warm, good capillary refill, no trophic changes or ulcerative lesions, normal DP and PT pulses and reduced sensation, inconsistent in plantar toes and soles with SW monofilament. Left Foot: hammertoes 2-5 and moderate HAV, onychomycosis x5  Right Foot: hammertoes 2-5 and moderate HAV, onychomycosis x5     Assessment:   1. Onychomycosis   2. Controlled type 2 diabetes with neuropathy (HCC)   3. Pain due to onychomycosis of toenails of both feet   4. Hammertoe of left foot   5. Hammertoe of right foot   6. Hallux valgus with bunions, right   7. Hallux valgus with bunions, left      Plan:  Patient was evaluated and treated and all questions answered.   -Patient is diabetic with a qualifying condition for at risk foot care. -Nails palliatively debrided secondary to pain -Would benefit from DM shoes. Offered to refer for fabrication and they would like to hold off for now which is fine. Will revisit in future -Educated on DM Footcare.  Procedure: Nail Debridement Rationale: Patient meets criteria for routine foot care due to diabetes, neuropathy, and deformity Type of Debridement: manual, sharp debridement. Instrumentation: Nail nipper, rotary burr. Number of Nails: 10    Return in about 3 months (around 06/10/2020) for diabetic nail trim.

## 2020-03-10 NOTE — Patient Instructions (Signed)
Diabetes Mellitus and Foot Care Foot care is an important part of your health, especially when you have diabetes. Diabetes may cause you to have problems because of poor blood flow (circulation) to your feet and legs, which can cause your skin to:  Become thinner and drier.  Break more easily.  Heal more slowly.  Peel and crack. You may also have nerve damage (neuropathy) in your legs and feet, causing decreased feeling in them. This means that you may not notice minor injuries to your feet that could lead to more serious problems. Noticing and addressing any potential problems early is the best way to prevent future foot problems. How to care for your feet Foot hygiene  Wash your feet daily with warm water and mild soap. Do not use hot water. Then, pat your feet and the areas between your toes until they are completely dry. Do not soak your feet as this can dry your skin.  Trim your toenails straight across. Do not dig under them or around the cuticle. File the edges of your nails with an emery board or nail file.  Apply a moisturizing lotion or petroleum jelly to the skin on your feet and to dry, brittle toenails. Use lotion that does not contain alcohol and is unscented. Do not apply lotion between your toes. Shoes and socks  Wear clean socks or stockings every day. Make sure they are not too tight. Do not wear knee-high stockings since they may decrease blood flow to your legs.  Wear shoes that fit properly and have enough cushioning. Always look in your shoes before you put them on to be sure there are no objects inside.  To break in new shoes, wear them for just a few hours a day. This prevents injuries on your feet. Wounds, scrapes, corns, and calluses  Check your feet daily for blisters, cuts, bruises, sores, and redness. If you cannot see the bottom of your feet, use a mirror or ask someone for help.  Do not cut corns or calluses or try to remove them with medicine.  If you  find a minor scrape, cut, or break in the skin on your feet, keep it and the skin around it clean and dry. You may clean these areas with mild soap and water. Do not clean the area with peroxide, alcohol, or iodine.  If you have a wound, scrape, corn, or callus on your foot, look at it several times a day to make sure it is healing and not infected. Check for: ? Redness, swelling, or pain. ? Fluid or blood. ? Warmth. ? Pus or a bad smell. General instructions  Do not cross your legs. This may decrease blood flow to your feet.  Do not use heating pads or hot water bottles on your feet. They may burn your skin. If you have lost feeling in your feet or legs, you may not know this is happening until it is too late.  Protect your feet from hot and cold by wearing shoes, such as at the beach or on hot pavement.  Schedule a complete foot exam at least once a year (annually) or more often if you have foot problems. If you have foot problems, report any cuts, sores, or bruises to your health care provider immediately. Contact a health care provider if:  You have a medical condition that increases your risk of infection and you have any cuts, sores, or bruises on your feet.  You have an injury that is not   healing.  You have redness on your legs or feet.  You feel burning or tingling in your legs or feet.  You have pain or cramps in your legs and feet.  Your legs or feet are numb.  Your feet always feel cold.  You have pain around a toenail. Get help right away if:  You have a wound, scrape, corn, or callus on your foot and: ? You have pain, swelling, or redness that gets worse. ? You have fluid or blood coming from the wound, scrape, corn, or callus. ? Your wound, scrape, corn, or callus feels warm to the touch. ? You have pus or a bad smell coming from the wound, scrape, corn, or callus. ? You have a fever. ? You have a red line going up your leg. Summary  Check your feet every day  for cuts, sores, red spots, swelling, and blisters.  Moisturize feet and legs daily.  Wear shoes that fit properly and have enough cushioning.  If you have foot problems, report any cuts, sores, or bruises to your health care provider immediately.  Schedule a complete foot exam at least once a year (annually) or more often if you have foot problems. This information is not intended to replace advice given to you by your health care provider. Make sure you discuss any questions you have with your health care provider. Document Revised: 04/29/2019 Document Reviewed: 09/07/2016 [employer] Patient Education  2020 [employer]  

## 2020-03-14 ENCOUNTER — Ambulatory Visit: Payer: Medicare Other

## 2020-03-14 ENCOUNTER — Other Ambulatory Visit: Payer: Self-pay

## 2020-03-14 ENCOUNTER — Encounter: Payer: Medicare Other | Admitting: Occupational Therapy

## 2020-03-14 DIAGNOSIS — R41841 Cognitive communication deficit: Secondary | ICD-10-CM

## 2020-03-14 DIAGNOSIS — R2681 Unsteadiness on feet: Secondary | ICD-10-CM

## 2020-03-14 DIAGNOSIS — M545 Low back pain, unspecified: Secondary | ICD-10-CM

## 2020-03-14 DIAGNOSIS — Z8673 Personal history of transient ischemic attack (TIA), and cerebral infarction without residual deficits: Secondary | ICD-10-CM

## 2020-03-14 DIAGNOSIS — R2689 Other abnormalities of gait and mobility: Secondary | ICD-10-CM

## 2020-03-14 DIAGNOSIS — M6281 Muscle weakness (generalized): Secondary | ICD-10-CM

## 2020-03-14 NOTE — Therapy (Signed)
[facility] [address] [place], [place], [address] Phone: [phone]   Fax:  [phone]  Physical Therapy Treatment  Patient Details  Name: [name] [name] MRN: [id number] Date of Birth: 01-Apr-1946 Referring Provider (PT): Alysia Penna   Encounter Date: 03/14/2020   PT End of Session - 03/14/20 2116    Visit Number 13    Number of Visits 17    Date for PT Re-Evaluation 04/26/20    Authorization Type Medicare payer so 10th visit progress note, FOTO    PT Start Time 1400    PT Stop Time 1445    PT Time Calculation (min) 45 min    Equipment Utilized During Treatment Gait belt    Activity Tolerance Patient tolerated treatment well    Behavior During Therapy [facility] for tasks assessed/performed           Past Medical History:  Diagnosis Date  . Chronic back pain    stenosis  . Enlarged prostate   . GERD (gastroesophageal reflux disease)    occasionally will take a zantac(maybe once a month)  . History of colon polyps   . History of kidney stones   . History of stress test    done 10 yrs. ago, as a baseline   . Hyperlipidemia    takes Crestor daily  . Hypertension    takes Amlodipine daily and Lotensin as well  . Stroke [facility])     Past Surgical History:  Procedure Laterality Date  . ANTERIOR LAT LUMBAR FUSION Left 12/15/2014   Procedure: ANTERIOR LATERAL LUMBAR FUSION 1 LEVEL;  Surgeon: Phylliss Bob, MD;  Location: [place];  Service: Orthopedics;  Laterality: Left;  Left sided lateral lumbar interbody fusion, lumbar 3-4, posterior spinal fusion, lumbar 3-4 with instrumentation.  . COLONOSCOPY    . ESOPHAGOGASTRODUODENOSCOPY    . fatty tissue removed from stomach    . LUMBAR LAMINECTOMY/DECOMPRESSION MICRODISCECTOMY N/A 07/08/2013   Procedure: LUMBAR LAMINECTOMY/DECOMPRESSION MICRODISCECTOMY;  Surgeon: Sinclair Ship, MD;  Location: [place];  Service: Orthopedics;  Laterality: N/A;  Lumbar 3-4, lumbar  4-5 decompression  . RADIOLOGY WITH ANESTHESIA N/A 10/22/2019   Procedure: MRI WITH ANESTHESIA OF LUMBAR SPINE WITHOUT CONTRAST;  Surgeon: Radiologist, Medication, MD;  Location: [place];  Service: Radiology;  Laterality: N/A;  . right ankle surgery     as child   . THORACIC AORTIC ENDOVASCULAR STENT GRAFT N/A 10/15/2019   Procedure: THORACIC AORTIC ENDOVASCULAR STENT GRAFT;  Surgeon: Waynetta Sandy, MD;  Location: [place];  Service: Vascular;  Laterality: N/A;  . TONSILLECTOMY     as a child    There were no vitals filed for this visit.   Subjective Assessment - 03/14/20 1407    Subjective Just had an orthopedic consult today. He had an epidural injection 1 week ago which lasted for 3 days. MD wants him to try 2 more shots. Sitting in cofortable position doesn't bother him. Standing and walking is limited due to pain. He is able to walk 15-20 min in his driveway.    Patient is accompained by: Family member    Pertinent History 74 y.o. right-handed male with history of chronic back pain with lumbar discectomy 2014 as well as anterior lateral lumbar fusion 2016, hypertension, diabetes mellitus, hyperlipidemia and remote tobacco abuse.    Patient Stated Goals Pt would like to be able to have sex.    Pain Onset More than a month ago  STG and LTG addressed today Stair training: cues for looking down. SBA only.  Practiced alternating and step to pattern going up and down steps. Pt is more comfortable with reciprocal gait pattern going up and step to pattern when coming down steps. Total 24 steps with use of rail on R going up and rail on L coming down.  Practiced walking >200 feet of grass, 10 feet of gravel, 10 feet of mulch with cues of looking down and picking up feet more in taller grass surface. Practiced going up and down one curb step, cues to look down and getting closer to the edge of the curb before coming down or stepping up. CGA for safety   Educated  on gradual walking program and use of rollator to improve walking endurance. Patient educated on walking until 5/10 exertionand then sitting down to rest and then walking back towards his house again.  Discharge planning discussed with patient and wife at next session. Both verbalized agreement.  Session findings and patient progress discussed with patient and wife.                 PT Short Term Goals - 02/25/20 1407      PT SHORT TERM GOAL #1   Title Pt will be independent with initial HEP for strength and flexibility and balance.    Baseline Pt denies any problems with initial exercises. Knows he needs to do more.    Time 4    Period Weeks    Status Achieved    Target Date 02/26/20      PT SHORT TERM GOAL #2   Title Pt will be instructed in back safety including proper body mechanics and positioning to better manage back pain and verbalize understanding.    Baseline Pt was able to demonstrate and verbalize proper body mechanics with bed mobility and lifting    Time 4    Period Weeks    Status Achieved    Target Date 02/26/20      PT SHORT TERM GOAL #3   Title Pt will be able to perform transfers sit to stand from varied surfaces mod I for improved mobility and functional strength.    Baseline mod I with sit to stand transfers with UE support.    Time 4    Period Weeks    Status Achieved    Target Date 02/26/20      PT SHORT TERM GOAL #4   Title Pt will ambulate with RW >300' on varied surfaces mod I for improved mobility.    Baseline 345' on level surfaces with rollator, supervision on nonlevel    Time 4    Period Weeks    Status Partially Met    Target Date 02/26/20      PT SHORT TERM GOAL #5   Title Patient will be able to perform 5x sit to stand without UE support to improve functional strength    Baseline 0 reps (02/01/20), 02/25/20 pt needs UE support to rise    Time 4    Period Weeks    Status Not Met    Target Date 02/29/20             PT Long  Term Goals - 03/14/20 1413      PT LONG TERM GOAL #1   Title Pt will be independent in progressive HEP for strength, balance and functional mobility to continue gains on own.    Baseline HEP issued 02/01/20    Time  8    Period Weeks    Status Achieved    Target Date 03/27/20      PT LONG TERM GOAL #2   Title Pt will increase gait speed from 0.12ms to >0.737m for improved community ambulation.    Baseline 0.5873mon 01/26/20; 0.58m70mithout AD (03/14/20); 1.45m/95mth rollator (03/14/20)    Time 8    Period Weeks    Status Achieved    Target Date 03/27/20      PT LONG TERM GOAL #3   Title Pt will decrease TUG from 29 sec to <24 sec for improved balance and functional strength to decrease fall risk.    Baseline 29 sec on 01/26/20; 13.90 seconds without AD (726/21)    Time 8    Period Weeks    Status Achieved      PT LONG TERM GOAL #4   Title Pt will ambulate >400' on varied surfaces with LRAD versus no AD for short community distances mod I.    Baseline able to walk thorugh 300' of grass, 10' of gravel, 10' of mulch with SBA/CGA    Time 8    Period Weeks    Status Achieved    Target Date 03/27/20      PT LONG TERM GOAL #5   Title Pt will ambulate up/down a flight of stairs with 1 rail right mod I for improved safety in home.    Time 8    Period Weeks    Status Achieved      PT LONG TERM GOAL #6   Title Pt will demo >50/56 on BBS to improve balance and reduce fall risk    Baseline 42/56 (02/01/20), 03/07/20 [place]=42/56    Time 8    Period Weeks    Status New                 Plan - 03/14/20 2110    Clinical Impression Statement Patient has met 3/5 short term and 5/6 long term goals. Patient has made significant progress in his gait speed, Timed up and go test score. Patient was able to walk on grass with SBA and without AD. Patient demonstrated decreased confidence with walking on grass surface. Patient was educated to work with his wife to walk on small patch of grass  to  practice at home. Patient is compliant with using rollator for community ambulation and to work on his walking program. Patient is able to go up and down stairs with reciprocal gait pattern with use of one rail only but he is less confident with reciprocal gait pattern when coming down steps. Patient was educated to use reciprocal gait pattern when going up but using step to pattern when coming down steps. Patient will benefit from one more session to address LTG#6 and STG # 4 and 5 and finalize HEP.    Personal Factors and Comorbidities Comorbidity 3+    Comorbidities 73 y.33 right-handed male with history of chronic back pain with lumbar discectomy 2014 as well as anterior lateral lumbar fusion 2016, hypertension, diabetes mellitus, hyperlipidemia and remote tobacco abuse.    Examination-Activity Limitations Bed Mobility;Transfers;Locomotion Level;Stairs    Examination-Participation Restrictions Community Activity;Driving;Cleaning;Interpersonal Relationship    Stability/Clinical Decision Making Evolving/Moderate complexity    Rehab Potential Good    PT Frequency 2x / week    PT Duration 8 weeks    PT Treatment/Interventions ADLs/Self Care Home Management;DME Instruction;Moist Heat;Cryotherapy;Gait training;Stair training;Functional mobility training;Therapeutic activities;Neuromuscular re-education;Balance training;Therapeutic exercise;Patient/family education;Manual techniques;Passive range of motion;Vestibular  PT Next Visit Plan address LTG #6 and STG #4 and 5. Plan D/C next session.  Pt and wife were comfortable to continue with HEP, walking program to work on improving his strength and endurance.    Consulted and Agree with Plan of Care Patient;Family member/caregiver    Family Member Consulted wife, Barbaraann Share           Patient will benefit from skilled therapeutic intervention in order to improve the following deficits and impairments:  Abnormal gait, Decreased activity tolerance, Decreased  knowledge of use of DME, Decreased balance, Decreased endurance, Decreased range of motion, Decreased strength, Decreased mobility, Pain, Postural dysfunction, Impaired flexibility  Visit Diagnosis: Other abnormalities of gait and mobility  Muscle weakness (generalized)  Unsteadiness on feet  Chronic bilateral low back pain, unspecified whether sciatica present  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 History of repair of dissecting thoracic aneurysm   . Abdominal distension   . Pulmonary embolus ([place]) 10/19/2019  . CVA (cerebral vascular accident) ([place]) 10/19/2019  . AKI (acute kidney injury) ([place]) 10/19/2019  . 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 Ileus ([place]) 10/19/2019  . 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 Radiculopathy 12/15/2014    Kerrie Pleasure 03/14/2020, 9:21 PM  [facility] [address] [place], [place], [address] Phone: [phone]   Fax:  [phone]  Name: Calin Fantroy [place]way MRN: [id number] Date of Birth: 1946/03/17

## 2020-03-14 NOTE — Therapy (Signed)
[facility] [address] [place], [place], [address] Phone: [phone]   Fax:  [phone]  Speech Language Pathology Treatment  Patient Details  Name: [name] [name] MRN: [id number] Date of Birth: 1946-07-01 Referring Provider (SLP): Dr. Letta Pate   Encounter Date: 03/14/2020   End of Session - 03/14/20 1650    Visit Number 12    Number of Visits 17    Date for SLP Re-Evaluation 04/20/20    SLP Start Time 11    SLP Stop Time  1615    SLP Time Calculation (min) 41 min    Activity Tolerance Patient tolerated treatment well           Past Medical History:  Diagnosis Date   Chronic back pain    stenosis   Enlarged prostate    GERD (gastroesophageal reflux disease)    occasionally will take a zantac(maybe once a month)   History of colon polyps    History of kidney stones    History of stress test    done 10 yrs. ago, as a baseline    Hyperlipidemia    takes Crestor daily   Hypertension    takes Amlodipine daily and Lotensin as well   Stroke [facility])     Past Surgical History:  Procedure Laterality Date   ANTERIOR LAT LUMBAR FUSION Left 12/15/2014   Procedure: ANTERIOR LATERAL LUMBAR FUSION 1 LEVEL;  Surgeon: Phylliss Bob, MD;  Location: [place];  Service: Orthopedics;  Laterality: Left;  Left sided lateral lumbar interbody fusion, lumbar 3-4, posterior spinal fusion, lumbar 3-4 with instrumentation.   COLONOSCOPY     ESOPHAGOGASTRODUODENOSCOPY     fatty tissue removed from stomach     LUMBAR LAMINECTOMY/DECOMPRESSION MICRODISCECTOMY N/A 07/08/2013   Procedure: LUMBAR LAMINECTOMY/DECOMPRESSION MICRODISCECTOMY;  Surgeon: Sinclair Ship, MD;  Location: [place];  Service: Orthopedics;  Laterality: N/A;  Lumbar 3-4, lumbar 4-5 decompression   RADIOLOGY WITH ANESTHESIA N/A 10/22/2019   Procedure: MRI WITH ANESTHESIA OF LUMBAR SPINE WITHOUT CONTRAST;  Surgeon: Radiologist, Medication, MD;   Location: [place];  Service: Radiology;  Laterality: N/A;   right ankle surgery     as child    THORACIC AORTIC ENDOVASCULAR STENT GRAFT N/A 10/15/2019   Procedure: THORACIC AORTIC ENDOVASCULAR STENT GRAFT;  Surgeon: Waynetta Sandy, MD;  Location: [place];  Service: Vascular;  Laterality: N/A;   TONSILLECTOMY     as a child    There were no vitals filed for this visit.   Subjective Assessment - 03/14/20 1541    Currently in Pain? Yes    Pain Score 3     Pain Location Back    Pain Orientation Lower    Pain Descriptors / Indicators Aching    Pain Type Chronic pain                 ADULT SLP TREATMENT - 03/14/20 1541      General Information   Behavior/Cognition Alert;Pleasant mood;Cooperative   slow processing     Treatment Provided   Treatment provided Cognitive-Linquistic      Cognitive-Linquistic Treatment   Treatment focused on Cognition    Skilled Treatment Pt req'd mod-max cues to get out homework from previous session as first thing he needs to do each session (SLP has told pt this for last 4 sessinos including today), and req'd max cues to find appropriate place for his completed homework since last session. Wife commented that pt stated he needed to get his  homework behind the ST divider, when he was out in the lobby. Pt recalled some details of visit with back surgeon, remembered he attended virtual service yesterday. Process of getting homework out to present it to SLP took pt 15 minutes. Pt endorsed it took a lot longer than it would have prior to hospitalization. Wife commented family had noticed that pt was "forgetting things he shouldn't ahve been" and was increasingly "confused for no reason" prior to CVAs. SLP commented that 6 months after CVA we would expect someone to be able to complete tasks like this regularly, and that prior neurological condition was complicating pt's success in ST. SLP gave example of pt putting his homework in the appropriate  place pror to ST but not remembering where to retrieve it from, or even that he needed to retrieve it, when he was in the room.       Assessment / Recommendations / Plan   Plan Continue with current plan of care      Progression Toward Goals   Progression toward goals Progressing toward goals            SLP Education - 03/14/20 1650    Education Details prior/premorbid cognitive status is making progress challenging post-CVAs    Yin(s) Educated Patient;Spouse    Methods Explanation    Comprehension Verbalized understanding;Need further instruction            SLP Short Term Goals - 02/25/20 1455      SLP SHORT TERM GOAL #1   Title pt will demo sustained attention for 10- minute functional task x 3, in 3 sessions    Baseline 02-16-20 02/18/20    Time --    Period --    Status Partially Met      SLP SHORT TERM GOAL #2   Title pt will demo the knowledge that he can use a memory system for daily events, ID'ing prospective and past events, medication management, and appointment tracking, etc x 3 sessions    Time --    Period --    Status Not Met      SLP SHORT TERM GOAL #3   Title pt will complete standardized cognitive linguistic testing by visit 4    Status Achieved      SLP SHORT TERM GOAL #4   Title pt will exhibit emergent awareness in simple cognitive linguistic tasks    Time --    Period --    Status Not Met            SLP Long Term Goals - 03/14/20 1714      SLP LONG TERM GOAL #1   Title pt will demo selective attention for 10 minutes to perform simple cognitive linguistic tasks in a min-mod noisy environment x2 sessions    Time 2    Period Weeks    Status On-going      SLP LONG TERM GOAL #2   Title pt will use memory system to record prospective or recall past events with occasional min A x 3 visits    Baseline 02-29-20    Time 2    Period Weeks    Status On-going      SLP LONG TERM GOAL #3   Title pt will use anomia compensations successfully in  10 minutes simple-mod complex conversation over 2 therapy sessions    Time 2    Period Weeks    Status On-going      SLP LONG TERM GOAL #4  Title pt will exhibit emergent awareness in common min-mod complex household tasks with modified inedpendence in 3 sessions    Time 2    Period Weeks    Status On-going            Plan - 03/14/20 1713    Clinical Impression Statement Mr. Casasola presents with at least cont'd moderate cognitive deficits s/p CVA. Pt had history of cognitive deficits prior to CVA, and was undergoing w/u for dementia prior to hospitalization per MD notes. These prior deficits make achieving progress in ST difficult. Prior infarct and white matter changes are noted on an MRI from 2011. 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 He managed finances and medications and helped with household chores prior to CVA, but wife is managing this now. I recommend skilled ST with emphasis on compensations to improve pt's functional recall and participation in simple household tasks.    Speech Therapy Frequency 2x / week    Duration --   8 weeks or 17 visits   Treatment/Interventions Compensatory strategies;Functional tasks;Patient/family education;Cognitive reorganization;Multimodal communcation approach;SLP instruction and feedback;Internal/external aids;Language facilitation;Compensatory techniques    Potential to Achieve Goals Fair    Potential Considerations Previous level of function    SLP Home Exercise Plan Get planner or binder for pt for memory notebook    Consulted and Agree with Plan of Care Patient;Family member/caregiver           Patient will benefit from skilled therapeutic intervention in order to improve the following deficits and impairments:   Cognitive communication deficit    Problem List Patient Active Problem List   Diagnosis Date Noted   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 [address] [place], [place], [address] Phone: [phone]   Fax:  [phone]   Name: [name] [name] MRN: [id number] Date of Birth: 01-17-1946

## 2020-03-14 NOTE — Patient Instructions (Signed)
  Please complete the assigned speech therapy homework prior to your next session and return it to the speech therapist at your next visit.  

## 2020-03-16 ENCOUNTER — Ambulatory Visit: Payer: Medicare Other

## 2020-03-16 ENCOUNTER — Other Ambulatory Visit: Payer: Self-pay

## 2020-03-16 ENCOUNTER — Encounter: Payer: Medicare Other | Admitting: Occupational Therapy

## 2020-03-16 DIAGNOSIS — R2681 Unsteadiness on feet: Secondary | ICD-10-CM

## 2020-03-16 DIAGNOSIS — R41841 Cognitive communication deficit: Secondary | ICD-10-CM

## 2020-03-16 DIAGNOSIS — M6281 Muscle weakness (generalized): Secondary | ICD-10-CM

## 2020-03-16 DIAGNOSIS — G8929 Other chronic pain: Secondary | ICD-10-CM

## 2020-03-16 DIAGNOSIS — R2689 Other abnormalities of gait and mobility: Secondary | ICD-10-CM

## 2020-03-16 NOTE — Patient Instructions (Addendum)
  Please complete the assigned speech therapy homework prior to your next session and return it to the speech therapist at your next visit.    Talk [employer] News [email or web address]  You will need to register for a [employer] account ([employer] is the company that developed [employer]). To do this you will need to provide an email account to register you. Once you do that it will allow you access to [employer] News.

## 2020-03-16 NOTE — Therapy (Signed)
[facility] [address] [place], [place], [address] Phone: [phone]   Fax:  [phone]  Physical Therapy Treatment/Discharge Summary   Patient Details  Name: [name] [name] MRN: [id number] Date of Birth: November 30, 1945 Referring Provider (PT): Alysia Penna  PHYSICAL THERAPY DISCHARGE SUMMARY  Visits from Start of Care: 14  Current functional level related to goals / functional outcomes: See Clinical Impression Statement and Goals for Details.   Remaining deficits: Mild Fall Risk. Decreased Strength. Chronic Back Pain   Education / Equipment: Educated on HEP and Continuing walking program.   Plan: Patient agrees to discharge.  Patient goals were partially met. Patient is being discharged due to meeting the stated rehab goals.  ?????       Encounter Date: 03/16/2020   PT End of Session - 03/16/20 1615    Visit Number 14    Number of Visits 17    Date for PT Re-Evaluation 04/26/20    Authorization Type Medicare payer so 10th visit progress note, FOTO    PT Start Time 1530    PT Stop Time 1612    PT Time Calculation (min) 42 min    Equipment Utilized During Treatment Gait belt    Activity Tolerance Patient tolerated treatment well    Behavior During Therapy WFL for tasks assessed/performed           Past Medical History:  Diagnosis Date  . Chronic back pain    stenosis  . Enlarged prostate   . GERD (gastroesophageal reflux disease)    occasionally will take a zantac(maybe once a month)  . History of colon polyps   . History of kidney stones   . History of stress test    done 10 yrs. ago, as a baseline   . Hyperlipidemia    takes Crestor daily  . Hypertension    takes Amlodipine daily and Lotensin as well  . Stroke [facility]     Past Surgical History:  Procedure Laterality Date  . ANTERIOR LAT LUMBAR FUSION Left 12/15/2014   Procedure: ANTERIOR LATERAL LUMBAR FUSION 1 LEVEL;  Surgeon: Phylliss Bob, MD;  Location: [facility];  Service: Orthopedics;  Laterality: Left;  Left sided lateral lumbar interbody fusion, lumbar 3-4, posterior spinal fusion, lumbar 3-4 with instrumentation.  . COLONOSCOPY    . ESOPHAGOGASTRODUODENOSCOPY    . fatty tissue removed from stomach    . LUMBAR LAMINECTOMY/DECOMPRESSION MICRODISCECTOMY N/A 07/08/2013   Procedure: LUMBAR LAMINECTOMY/DECOMPRESSION MICRODISCECTOMY;  Surgeon: Sinclair Ship, MD;  Location: [facility];  Service: Orthopedics;  Laterality: N/A;  Lumbar 3-4, lumbar 4-5 decompression  . RADIOLOGY WITH ANESTHESIA N/A 10/22/2019   Procedure: MRI WITH ANESTHESIA OF LUMBAR SPINE WITHOUT CONTRAST;  Surgeon: Radiologist, Medication, MD;  Location: [facility];  Service: Radiology;  Laterality: N/A;  . right ankle surgery     as child   . THORACIC AORTIC ENDOVASCULAR STENT GRAFT N/A 10/15/2019   Procedure: THORACIC AORTIC ENDOVASCULAR STENT GRAFT;  Surgeon: Waynetta Sandy, MD;  Location: [facility];  Service: Vascular;  Laterality: N/A;  . TONSILLECTOMY     as a child    There were no vitals filed for this visit.   Subjective Assessment - 03/16/20 1534    Subjective Patient reports doing well. No falls.    Patient is accompained by: Family member    Pertinent History 74 y.o. right-handed male with history of chronic back pain with lumbar discectomy 2014 as well as anterior lateral lumbar fusion 2016, hypertension,  diabetes mellitus, hyperlipidemia and remote tobacco abuse.    Patient Stated Goals Pt would like to be able to have sex.    Currently in Pain? Yes    Pain Score 2     Pain Location Back    Pain Orientation Lower    Pain Descriptors / Indicators Aching    Pain Type Chronic pain    Pain Onset More than a month ago                             [facility] Adult PT Treatment/Exercise - 03/16/20 0001      Transfers   Transfers Sit to Stand;Stand to Sit    Sit to Stand 6: Modified independent (Device/Increase time)     Stand to Sit 6: Modified independent (Device/Increase time)      Ambulation/Gait   Ambulation/Gait Yes    Ambulation/Gait Assistance 6: Modified independent (Device/Increase time)    Ambulation/Gait Assistance Details completed gait on indoor/outdoor levels surfaces with rollator with patient Mod I.     Ambulation Distance (Feet) 500 Feet    Assistive device Rollator    Gait Pattern Step-through pattern    Ambulation Surface Level;Indoor      Standardized Balance Assessment   Standardized Balance Assessment Berg Balance Test      Berg Balance Test   Sit to Stand Able to stand without using hands and stabilize independently    Standing Unsupported Able to stand safely 2 minutes    Sitting with Back Unsupported but Feet Supported on Floor or Stool Able to sit safely and securely 2 minutes    Stand to Sit Sits safely with minimal use of hands    Transfers Able to transfer safely, minor use of hands    Standing Unsupported with Eyes Closed Able to stand 10 seconds safely    Standing Ubsupported with Feet Together Able to place feet together independently and stand 1 minute safely    From Standing, Reach Forward with Outstretched Arm Can reach confidently >25 cm (10")    From Standing Position, Pick up Object from Floor Able to pick up shoe safely and easily    From Standing Position, Turn to Look Behind Over each Shoulder Looks behind one side only/other side shows less weight shift    Turn 360 Degrees Able to turn 360 degrees safely one side only in 4 seconds or less    Standing Unsupported, Alternately Place Feet on Step/Stool Able to stand independently and complete 8 steps >20 seconds    Standing Unsupported, One Foot in Front Able to take small step independently and hold 30 seconds    Standing on One Leg Able to lift leg independently and hold equal to or more than 3 seconds    Total Score 49      Exercises   Exercises Other Exercises    Other Exercises  Reviewed current HEP and  updated as tolerated by patient.             Access Code: [email or web address] URL: [email or web address] Date: 03/16/2020 Prepared by: Baldomero Lamy  Exercises Sit to Stand - 1 x daily - 7 x weekly - 2 sets - 5 reps Sitting to Supine Roll - 1 x daily - 7 x weekly - 10 reps - 3 sets Supine Single Knee to Chest Stretch - 1 x daily - 5 x weekly - 1 sets - 3 reps - 20-30 sec hold Standing Marching - 2 x  daily - 5 x weekly - 1 sets - 10 reps Side Stepping with Resistance at Thighs and Counter Support - 1 x daily - 7 x weekly - 2 sets - 10 reps Standing Hip Extension with Counter Support - 1 x daily - 7 x weekly - 2 sets - 10 reps Mini Squat with Counter Support - 1 x daily - 7 x weekly - 2 sets - 10 reps        PT Education - 03/16/20 1614    Education Details Updated on Progress Toward Goals; HEP Update.    Fahringer(s) Educated Patient;Spouse    Methods Explanation;Demonstration;Handout    Comprehension Verbalized understanding            PT Short Term Goals - 03/16/20 1536      PT SHORT TERM GOAL #1   Title Pt will be independent with initial HEP for strength and flexibility and balance.    Baseline Pt denies any problems with initial exercises. Knows he needs to do more.    Time 4    Period Weeks    Status Achieved    Target Date 02/26/20      PT SHORT TERM GOAL #2   Title Pt will be instructed in back safety including proper body mechanics and positioning to better manage back pain and verbalize understanding.    Baseline Pt was able to demonstrate and verbalize proper body mechanics with bed mobility and lifting    Time 4    Period Weeks    Status Achieved    Target Date 02/26/20      PT SHORT TERM GOAL #3   Title Pt will be able to perform transfers sit to stand from varied surfaces mod I for improved mobility and functional strength.    Baseline mod I with sit to stand transfers with UE support.    Time 4    Period Weeks    Status Achieved    Target Date  02/26/20      PT SHORT TERM GOAL #4   Title Pt will ambulate with RW >300' on varied surfaces mod I for improved mobility.    Baseline 500 ft on unlevel/level surfaces, Mod I    Time 4    Period Weeks    Status Achieved    Target Date 02/26/20      PT SHORT TERM GOAL #5   Title Patient will be able to perform 5x sit to stand without UE support to improve functional strength    Baseline 0 reps (02/01/20), 02/25/20 pt needs UE support to rise, unable to perform 2 reps w/o UE support    Time 4    Period Weeks    Status Not Met    Target Date 02/29/20             PT Long Term Goals - 03/16/20 1557      PT LONG TERM GOAL #1   Title Pt will be independent in progressive HEP for strength, balance and functional mobility to continue gains on own.    Baseline HEP issued 02/01/20    Time 8    Period Weeks    Status Achieved      PT LONG TERM GOAL #2   Title Pt will increase gait speed from 0.8ms to >0.771m for improved community ambulation.    Baseline 0.5828mon 01/26/20; 0.57m38mithout AD (03/14/20); 1.6m/53mth rollator (03/14/20)    Time 8    Period Weeks    Status  Achieved      PT LONG TERM GOAL #3   Title Pt will decrease TUG from 29 sec to <24 sec for improved balance and functional strength to decrease fall risk.    Baseline 29 sec on 01/26/20; 13.90 seconds without AD (726/21)    Time 8    Period Weeks    Status Achieved      PT LONG TERM GOAL #4   Title Pt will ambulate >400' on varied surfaces with LRAD versus no AD for short community distances mod I.    Baseline able to walk thorugh 300' of grass, 10' of gravel, 10' of mulch with SBA/CGA    Time 8    Period Weeks    Status Achieved      PT LONG TERM GOAL #5   Title Pt will ambulate up/down a flight of stairs with 1 rail right mod I for improved safety in home.    Time 8    Period Weeks    Status Achieved      PT LONG TERM GOAL #6   Title Pt will demo >50/56 on BBS to improve balance and reduce fall risk     Baseline 42/56 (02/01/20), 03/07/20 Berg=42/56, 49/56  on 03/16/20    Time 8    Period Weeks    Status Not Met                 Plan - 03/16/20 1619    Clinical Impression Statement Patient has demonstrated significant progress with PT services, patient has met 5/6 LTG at this time. Patient scored a 49/56 on the [facility], still demonstrating difficulty with tandem stance and SLS. Overall balance and functional mobility has improved significantly. PT updated current HEP and progressed as tolerated by patient. PT stating readiness for discharge, with Patient and wife agreeable to discharge today.    Personal Factors and Comorbidities Comorbidity 3+    Comorbidities 74 y.o. right-handed male with history of chronic back pain with lumbar discectomy 2014 as well as anterior lateral lumbar fusion 2016, hypertension, diabetes mellitus, hyperlipidemia and remote tobacco abuse.    Examination-Activity Limitations Bed Mobility;Transfers;Locomotion Level;Stairs    Examination-Participation Restrictions Community Activity;Driving;Cleaning;Interpersonal Relationship    Stability/Clinical Decision Making Evolving/Moderate complexity    Rehab Potential Good    PT Frequency 2x / week    PT Duration 8 weeks    PT Treatment/Interventions ADLs/Self Care Home Management;DME Instruction;Moist Heat;Cryotherapy;Gait training;Stair training;Functional mobility training;Therapeutic activities;Neuromuscular re-education;Balance training;Therapeutic exercise;Patient/family education;Manual techniques;Passive range of motion;Vestibular    Consulted and Agree with Plan of Care Patient;Family member/caregiver    Family Member Consulted wife, Barbaraann Share           Patient will benefit from skilled therapeutic intervention in order to improve the following deficits and impairments:  Abnormal gait, Decreased activity tolerance, Decreased knowledge of use of DME, Decreased balance, Decreased endurance, Decreased range of  motion, Decreased strength, Decreased mobility, Pain, Postural dysfunction, Impaired flexibility  Visit Diagnosis: Other abnormalities of gait and mobility  Muscle weakness (generalized)  Unsteadiness on feet  Chronic bilateral low back pain, unspecified whether sciatica present     Problem List Patient Active Problem List   Diagnosis Date Noted  . 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 History of repair of dissecting thoracic aneurysm   . Abdominal distension   . Pulmonary embolus ([place]) 10/19/2019  . CVA (cerebral vascular accident) ([place]) 10/19/2019  . AKI (acute kidney injury) ([facility]) 10/19/2019  . 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 Ileus ([place]) 10/19/2019  . 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 Radiculopathy 12/15/2014    Jones Bales, PT, DPT 03/16/2020, 4:21 PM  [facility] [address] [place], [place], [address] Phone: [phone]   Fax:  [phone]  Name: Brycen Bean Spindle MRN: [id number] Date of Birth: 1946-08-17

## 2020-03-16 NOTE — Therapy (Signed)
[facility] [address] [place], [place], [address] Phone: [phone]   Fax:  [phone]  Speech Language Pathology Treatment  Patient Details  Name: [name] [name] MRN: [id number] Date of Birth: 1946-04-22 Referring Provider (SLP): Dr. Letta Pate   Encounter Date: 03/16/2020   End of Session - 03/16/20 1642    Visit Number 13    Number of Visits 17    Date for SLP Re-Evaluation 04/20/20    SLP Start Time 5638    SLP Stop Time  1446    SLP Time Calculation (min) 41 min    Activity Tolerance Patient tolerated treatment well           Past Medical History:  Diagnosis Date  . Chronic back pain    stenosis  . Enlarged prostate   . GERD (gastroesophageal reflux disease)    occasionally will take a zantac(maybe once a month)  . History of colon polyps   . History of kidney stones   . History of stress test    done 10 yrs. ago, as a baseline   . Hyperlipidemia    takes Crestor daily  . Hypertension    takes Amlodipine daily and Lotensin as well  . Stroke [facility])     Past Surgical History:  Procedure Laterality Date  . ANTERIOR LAT LUMBAR FUSION Left 12/15/2014   Procedure: ANTERIOR LATERAL LUMBAR FUSION 1 LEVEL;  Surgeon: Phylliss Bob, MD;  Location: [facility];  Service: Orthopedics;  Laterality: Left;  Left sided lateral lumbar interbody fusion, lumbar 3-4, posterior spinal fusion, lumbar 3-4 with instrumentation.  . COLONOSCOPY    . ESOPHAGOGASTRODUODENOSCOPY    . fatty tissue removed from stomach    . LUMBAR LAMINECTOMY/DECOMPRESSION MICRODISCECTOMY N/A 07/08/2013   Procedure: LUMBAR LAMINECTOMY/DECOMPRESSION MICRODISCECTOMY;  Surgeon: Sinclair Ship, MD;  Location: [facility];  Service: Orthopedics;  Laterality: N/A;  Lumbar 3-4, lumbar 4-5 decompression  . RADIOLOGY WITH ANESTHESIA N/A 10/22/2019   Procedure: MRI WITH ANESTHESIA OF LUMBAR SPINE WITHOUT CONTRAST;  Surgeon: Radiologist, Medication, MD;   Location: [facility];  Service: Radiology;  Laterality: N/A;  . right ankle surgery     as child   . THORACIC AORTIC ENDOVASCULAR STENT GRAFT N/A 10/15/2019   Procedure: THORACIC AORTIC ENDOVASCULAR STENT GRAFT;  Surgeon: Waynetta Sandy, MD;  Location: [facility];  Service: Vascular;  Laterality: N/A;  . TONSILLECTOMY     as a child    There were no vitals filed for this visit.   Subjective Assessment - 03/16/20 1412    Subjective Pt got out homework and provided for SLP without any assistance.    Patient is accompained by: Family member   wife   Currently in Pain? No/denies    Pain Score 2     Pain Location Back    Pain Orientation Lower    Pain Descriptors / Indicators Aching    Pain Type Chronic pain                 ADULT SLP TREATMENT - 03/16/20 1412      General Information   Behavior/Cognition Alert;Pleasant mood;Cooperative      Treatment Provided   Treatment provided Cognitive-Linquistic      Cognitive-Linquistic Treatment   Treatment focused on Cognition    Skilled Treatment "I was determined to do better." (pt, re: why he got out homework today). SLP worked through one problem on his homework which he got wrong - took pt 8 minutes with max  cues from SLP- he was unable to state why it was so challengning for him. SLP suggested that pt had difficulty keeping "over 90 minutes" in his mind while also figuring coin combination for how much money was necessary for over 90 mintues (parkingmeter task). Pt completed talk path news articles with 5/6 questions correct.       Assessment / Recommendations / Plan   Plan Continue with current plan of care      Progression Toward Goals   Progression toward goals Progressing toward goals              SLP Short Term Goals - 02/25/20 1455      SLP SHORT TERM GOAL #1   Title pt will demo sustained attention for 10- minute functional task x 3, in 3 sessions    Baseline 02-16-20 02/18/20    Time --    Period --    Status  Partially Met      SLP SHORT TERM GOAL #2   Title pt will demo the knowledge that he can use a memory system for daily events, ID'ing prospective and past events, medication management, and appointment tracking, etc x 3 sessions    Time --    Period --    Status Not Met      SLP SHORT TERM GOAL #3   Title pt will complete standardized cognitive linguistic testing by visit 4    Status Achieved      SLP SHORT TERM GOAL #4   Title pt will exhibit emergent awareness in simple cognitive linguistic tasks    Time --    Period --    Status Not Met            SLP Long Term Goals - 03/16/20 1643      SLP LONG TERM GOAL #1   Title pt will demo selective attention for 10 minutes to perform simple cognitive linguistic tasks in a min-mod noisy environment x2 sessions    Time 2    Period Weeks    Status On-going      SLP LONG TERM GOAL #2   Title pt will use memory system to record prospective or recall past events with occasional min A x 3 visits    Baseline 02-29-20    Time 2    Period Weeks    Status On-going      SLP LONG TERM GOAL #3   Title pt will use anomia compensations successfully in 10 minutes simple-mod complex conversation over 2 therapy sessions    Time 2    Period Weeks    Status On-going      SLP LONG TERM GOAL #4   Title pt will exhibit emergent awareness in common min-mod complex household tasks with modified inedpendence in 3 sessions    Time 2    Period Weeks    Status On-going            Plan - 03/16/20 1643    Clinical Impression Statement [name] [name] presents with at least cont'd moderate cognitive deficits s/p CVA. Pt had history of cognitive deficits prior to CVA, and was undergoing w/u for dementia prior to hospitalization per MD notes. These prior deficits make achieving progress in ST difficult. Prior infarct and white matter changes are noted on an MRI from 2011. 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  He managed finances and medications and helped with household chores prior to CVA,  but wife is managing this now. I recommend skilled ST with emphasis on compensations to improve pt's functional recall and participation in simple household tasks.    Speech Therapy Frequency 2x / week    Duration --   8 weeks or 17 visits   Treatment/Interventions Compensatory strategies;Functional tasks;Patient/family education;Cognitive reorganization;Multimodal communcation approach;SLP instruction and feedback;Internal/external aids;Language facilitation;Compensatory techniques    Potential to Achieve Goals Fair    Potential Considerations Previous level of function    SLP Home Exercise Plan Get planner or binder for pt for memory notebook    Consulted and Agree with Plan of Care Patient;Family member/caregiver           Patient will benefit from skilled therapeutic intervention in order to improve the following deficits and impairments:   Cognitive communication deficit    Problem List Patient Active Problem List   Diagnosis Date Noted  . 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 History of repair of dissecting thoracic aneurysm   . Abdominal distension   . Pulmonary embolus ([place]) 10/19/2019  . CVA (cerebral vascular accident) ([place]) 10/19/2019  . AKI (acute kidney injury) ([place]) 10/19/2019  . 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 Ileus ([place]) 10/19/2019  . 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 Radiculopathy 12/15/2014    [facility] ,MS, CCC-SLP  03/16/2020, 4:44 PM  [facility] [address] [place], [place], [address] Phone: [phone]   Fax:  [phone]   Name: [name] [name] MRN: [id number] Date of Birth: [birth year]

## 2020-03-22 ENCOUNTER — Ambulatory Visit: Payer: Medicare Other | Attending: Internal Medicine | Admitting: Speech Pathology

## 2020-03-22 ENCOUNTER — Other Ambulatory Visit: Payer: Self-pay

## 2020-03-22 DIAGNOSIS — R41841 Cognitive communication deficit: Secondary | ICD-10-CM | POA: Insufficient documentation

## 2020-03-22 NOTE — Therapy (Signed)
[facility] [address] [place], [place], [address] Phone: [phone]   Fax:  [phone]  Speech Language Pathology Treatment  Patient Details  Name: [name] [name] MRN: [id number] Date of Birth: [birth year] Referring Provider (SLP): Dr. Letta Pate   Encounter Date: 03/22/2020   End of Session - 03/22/20 1850    Visit Number 14    Number of Visits 17    Date for SLP Re-Evaluation 04/20/20    Authorization Type UHC Medicare, $35 copay for all 3 disciplines on one day    SLP Start Time 1624    SLP Stop Time  1706    SLP Time Calculation (min) 42 min    Activity Tolerance Patient tolerated treatment well           Past Medical History:  Diagnosis Date  . Chronic back pain    stenosis  . Enlarged prostate   . GERD (gastroesophageal reflux disease)    occasionally will take a zantac(maybe once a month)  . History of colon polyps   . History of kidney stones   . History of stress test    done 10 yrs. ago, as a baseline   . Hyperlipidemia    takes Crestor daily  . Hypertension    takes Amlodipine daily and Lotensin as well  . Stroke [facility])     Past Surgical History:  Procedure Laterality Date  . ANTERIOR LAT LUMBAR FUSION Left 12/15/2014   Procedure: ANTERIOR LATERAL LUMBAR FUSION 1 LEVEL;  Surgeon: Phylliss Bob, MD;  Location: [place];  Service: Orthopedics;  Laterality: Left;  Left sided lateral lumbar interbody fusion, lumbar 3-4, posterior spinal fusion, lumbar 3-4 with instrumentation.  . COLONOSCOPY    . ESOPHAGOGASTRODUODENOSCOPY    . fatty tissue removed from stomach    . LUMBAR LAMINECTOMY/DECOMPRESSION MICRODISCECTOMY N/A 07/08/2013   Procedure: LUMBAR LAMINECTOMY/DECOMPRESSION MICRODISCECTOMY;  Surgeon: Sinclair Ship, MD;  Location: [place];  Service: Orthopedics;  Laterality: N/A;  Lumbar 3-4, lumbar 4-5 decompression  . RADIOLOGY WITH ANESTHESIA N/A 10/22/2019   Procedure: MRI WITH ANESTHESIA  OF LUMBAR SPINE WITHOUT CONTRAST;  Surgeon: Radiologist, Medication, MD;  Location: [place];  Service: Radiology;  Laterality: N/A;  . right ankle surgery     as child   . THORACIC AORTIC ENDOVASCULAR STENT GRAFT N/A 10/15/2019   Procedure: THORACIC AORTIC ENDOVASCULAR STENT GRAFT;  Surgeon: Waynetta Sandy, MD;  Location: [place];  Service: Vascular;  Laterality: N/A;  . TONSILLECTOMY     as a child    There were no vitals filed for this visit.   Subjective Assessment - 03/22/20 1628    Subjective "That would be under PT... No it wouldn't... ST"    Currently in Pain? No/denies                 ADULT SLP TREATMENT - 03/22/20 1844      General Information   Behavior/Cognition Alert;Cooperative      Treatment Provided   Treatment provided Cognitive-Linquistic      Pain Assessment   Pain Assessment No/denies pain      Cognitive-Linquistic Treatment   Treatment focused on Cognition    Skilled Treatment Patient got out his notebook independently. When SLP asked what he worked on with Gray, pt self-corrected when he initially turned to wrong section of binder. Homework was 95% complete. Cues required for double checking; mod cues for reasoning in tasks pt had omitted/left blank. SLP talked with pt and wife  about focus of remaining ST sessions on functional activities that impact pt in his daily life. Pt mentioned participating in conversations (keeping up with the group, paying attention, contributing) is something he would like to address. SLP worked with pt and spouse to ID strategies to use in group settings. Pt proposed asking questions more frequently. SLP educated wife on how she could assist pt by recapping and asking him open ended questions to encourage participation. Also suggested breaking up into smaller group in a quieter environment if group conversations are too distracting. Patient also mentioned driving as something that was important to him. SLP shared that  pt's deficit areas leave SLP with concerns about pt's ability to drive safely, and reinforced OT's recommendation for driving evaluation.       Assessment / Recommendations / Plan   Plan Continue with current plan of care      Progression Toward Goals   Progression toward goals Progressing toward goals            SLP Education - 03/22/20 1850    Education Details compensations for conversations, advise OT driving evaluation    [place](s) Educated Patient;Spouse    Methods Explanation    Comprehension Verbalized understanding;Need further instruction            SLP Short Term Goals - 03/22/20 1852      SLP SHORT TERM GOAL #1   Title pt will demo sustained attention for 10- minute functional task x 3, in 3 sessions    Baseline 02-16-20 02/18/20    Status Partially Met      SLP SHORT TERM GOAL #2   Title pt will demo the knowledge that he can use a memory system for daily events, ID'ing prospective and past events, medication management, and appointment tracking, etc x 3 sessions    Status Not Met      SLP SHORT TERM GOAL #3   Title pt will complete standardized cognitive linguistic testing by visit 4    Status Achieved      SLP SHORT TERM GOAL #4   Title pt will exhibit emergent awareness in simple cognitive linguistic tasks    Status Not Met            SLP Long Term Goals - 03/22/20 1852      SLP LONG TERM GOAL #1   Title pt will demo selective attention for 10 minutes to perform simple cognitive linguistic tasks in a min-mod noisy environment x2 sessions    Time 2    Period Weeks    Status On-going      SLP LONG TERM GOAL #2   Title pt will use memory system to record prospective or recall past events with occasional min A x 3 visits    Baseline 02-29-20    Time 2    Period Weeks    Status On-going      SLP LONG TERM GOAL #3   Title pt will use anomia compensations successfully in 10 minutes simple-mod complex conversation over 2 therapy sessions    Time 2     Period Weeks    Status On-going      SLP LONG TERM GOAL #4   Title pt will exhibit emergent awareness in common min-mod complex household tasks with modified inedpendence in 3 sessions    Time 2    Period Weeks    Status On-going            Plan - 03/22/20 1851  Clinical Impression Statement [name] [name] presents with at least cont'd moderate cognitive deficits s/p CVA. Pt had history of cognitive deficits prior to CVA, and was undergoing w/u for dementia prior to hospitalization per MD notes. These prior deficits make achieving progress in ST difficult. Prior infarct and white matter changes are noted on an MRI from 2011. 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 He managed finances and medications and helped with household chores prior to CVA, but wife is managing this now. I recommend skilled ST 1-2 more sessions with emphasis on compensations to improve pt's functional recall and participation in simple household tasks.    Speech Therapy Frequency 2x / week    Duration --   8 weeks or 17 visits   Treatment/Interventions Compensatory strategies;Functional tasks;Patient/family education;Cognitive reorganization;Multimodal communcation approach;SLP instruction and feedback;Internal/external aids;Language facilitation;Compensatory techniques    Potential to Achieve Goals Fair    Potential Considerations Previous level of function    SLP Home Exercise Plan Get planner or binder for pt for memory notebook    Consulted and Agree with Plan of Care Patient;Family member/caregiver           Patient will benefit from skilled therapeutic intervention in order to improve the following deficits and impairments:   Cognitive communication deficit    Problem List Patient Active Problem List   Diagnosis Date Noted  . 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 History of repair of dissecting thoracic aneurysm   .  Abdominal distension   . Pulmonary embolus ([place]) 10/19/2019  . CVA (cerebral vascular accident) ([place]) 10/19/2019  . AKI (acute kidney injury) ([place]) 10/19/2019  . 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 Ileus ([place]) 10/19/2019  . 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 Radiculopathy 12/15/2014   [name] [name], [place], [place] E Kahleah Crass 03/22/2020, 6:53 PM  [facility] [address] [place], [place], [address] Phone: [phone]   Fax:  [phone]   Name: [name] [name] MRN: [id number] Date of Birth: 1945/11/20

## 2020-03-24 ENCOUNTER — Ambulatory Visit: Payer: Medicare Other | Admitting: Speech Pathology

## 2020-03-24 ENCOUNTER — Ambulatory Visit: Payer: Medicare Other

## 2020-03-24 ENCOUNTER — Other Ambulatory Visit: Payer: Self-pay

## 2020-03-24 DIAGNOSIS — R41841 Cognitive communication deficit: Secondary | ICD-10-CM

## 2020-03-24 NOTE — Patient Instructions (Signed)
Online therapy activities  [email or web address].[email or web address].[email or web address]  Log in: [place]: therapy  Keep using your calendar every day. Keep your to-do list. Do brain activities 30 minutes every day. That can be online activities above, reading, playing a game or cards, or see below for additional ideas:   Cognitive Activities you can do at home:   - [place] (easy level)  - [place]  On your computer, tablet or phone: BrainHQ Brainbashers.com Neuronation App [employer] Game App [employer] IQ Logic [place] Sort it out (easy) Photo Quiz  - what's the word Mix 2 Words Spot the difference games  Conversation tips:  If you are in a large group, try asking one or two people to step aside with you to a quieter place so that you can focus better on the conversation without distractions. Use active listening: ask more questions! [name] can help "loop you in" in bigger conversations. She can notice opportunities for you to express yourself, "recap" to you and then ask you an open-ended question. Ex. "Lexine Baton was saying that the mask mandate is going to be extended. What do you think about that?"

## 2020-03-24 NOTE — Therapy (Signed)
[facility] [address] [place], [place], [address] Phone: [phone]   Fax:  [phone]  Speech Language Pathology Treatment and Discharge Summary  Patient Details  Name: [name] [name] MRN: [id number] Date of Birth: Jun 25, 1946 Referring Provider (SLP): Dr. Letta Pate   Encounter Date: 03/24/2020   End of Session - 03/24/20 1825    Visit Number 15    Number of Visits 17    Date for SLP Re-Evaluation 04/20/20    Authorization Type UHC Medicare, $35 copay for all 3 disciplines on one day    SLP Start Time 1618    SLP Stop Time  1700    SLP Time Calculation (min) 42 min    Activity Tolerance Patient tolerated treatment well           Past Medical History:  Diagnosis Date  . Chronic back pain    stenosis  . Enlarged prostate   . GERD (gastroesophageal reflux disease)    occasionally will take a zantac(maybe once a month)  . History of colon polyps   . History of kidney stones   . History of stress test    done 10 yrs. ago, as a baseline   . Hyperlipidemia    takes Crestor daily  . Hypertension    takes Amlodipine daily and Lotensin as well  . Stroke [facility])     Past Surgical History:  Procedure Laterality Date  . ANTERIOR LAT LUMBAR FUSION Left 12/15/2014   Procedure: ANTERIOR LATERAL LUMBAR FUSION 1 LEVEL;  Surgeon: Phylliss Bob, MD;  Location: [place];  Service: Orthopedics;  Laterality: Left;  Left sided lateral lumbar interbody fusion, lumbar 3-4, posterior spinal fusion, lumbar 3-4 with instrumentation.  . COLONOSCOPY    . ESOPHAGOGASTRODUODENOSCOPY    . fatty tissue removed from stomach    . LUMBAR LAMINECTOMY/DECOMPRESSION MICRODISCECTOMY N/A 07/08/2013   Procedure: LUMBAR LAMINECTOMY/DECOMPRESSION MICRODISCECTOMY;  Surgeon: Sinclair Ship, MD;  Location: [place];  Service: Orthopedics;  Laterality: N/A;  Lumbar 3-4, lumbar 4-5 decompression  . RADIOLOGY WITH ANESTHESIA N/A 10/22/2019   Procedure:  MRI WITH ANESTHESIA OF LUMBAR SPINE WITHOUT CONTRAST;  Surgeon: Radiologist, Medication, MD;  Location: [facility];  Service: Radiology;  Laterality: N/A;  . right ankle surgery     as child   . THORACIC AORTIC ENDOVASCULAR STENT GRAFT N/A 10/15/2019   Procedure: THORACIC AORTIC ENDOVASCULAR STENT GRAFT;  Surgeon: Waynetta Sandy, MD;  Location: [facility];  Service: Vascular;  Laterality: N/A;  . TONSILLECTOMY     as a child    There were no vitals filed for this visit.   Subjective Assessment - 03/24/20 1819    Subjective Pt got out notebook and ST homework independently    Patient is accompained by: Family member   [name] [name], wife   Currently in Pain? No/denies                 ADULT SLP TREATMENT - 03/24/20 1820      General Information   Behavior/Cognition Alert;Cooperative      Treatment Provided   Treatment provided Cognitive-Linquistic      Pain Assessment   Pain Assessment No/denies pain      Cognitive-Linquistic Treatment   Treatment focused on Cognition;Patient/family/caregiver education    Skilled Treatment Homework was completed; pt continues to require mod cues for awareness of errors (in written directions, perseverating some component of the previous instruction). Discussed plan of care as wife reports she is returning to school and had  moved pt's final appointment to ~1 month from today. Patient, wife and SLP decided today would be pt's last day for ST. Focused session on education re: conversation strategies, as well as ongoing use of calendar and daily cognitive activities. Set up online account for pt with [facility] and added appropriate activities; pt required occasional min-mod cues with moderate difficulty exercises (level 2/3) for attention to detail.       Assessment / Recommendations / Plan   Plan Discharge SLP treatment due to (comment)   pt pleased with current functional level     Progression Toward Goals   Progression toward goals --   Goals  partially met, education completed, pt d/c           SLP Education - 03/24/20 1825    Education Details cognitive home program, compensations for conversations    [place](s) Educated Patient;Spouse    Methods Explanation;Handout    Comprehension Verbalized understanding;Returned demonstration            SLP Short Term Goals - 03/24/20 1826      SLP SHORT TERM GOAL #1   Title pt will demo sustained attention for 10- minute functional task x 3, in 3 sessions    Baseline 02-16-20 02/18/20    Status Partially Met      SLP SHORT TERM GOAL #2   Title pt will demo the knowledge that he can use a memory system for daily events, ID'ing prospective and past events, medication management, and appointment tracking, etc x 3 sessions    Status Not Met      SLP SHORT TERM GOAL #3   Title pt will complete standardized cognitive linguistic testing by visit 4    Status Achieved      SLP SHORT TERM GOAL #4   Title pt will exhibit emergent awareness in simple cognitive linguistic tasks    Status Not Met            SLP Long Term Goals - 03/24/20 1826      SLP LONG TERM GOAL #1   Title pt will demo selective attention for 10 minutes to perform simple cognitive linguistic tasks in a min-mod noisy environment x2 sessions    Time 2    Period Weeks    Status Not Met      SLP LONG TERM GOAL #2   Title pt will use memory system to record prospective or recall past events with occasional min A x 3 visits    Baseline 02-29-20 03/24/20    Time 2    Period Weeks    Status Partially Met      SLP LONG TERM GOAL #3   Title pt will use anomia compensations successfully in 10 minutes simple-mod complex conversation over 2 therapy sessions    Time 2    Period Weeks    Status Deferred   no anomia noted in recent visits     SLP LONG TERM GOAL #4   Title pt will exhibit emergent awareness in common min-mod complex household tasks with modified inedpendence in 3 sessions    Time 2    Period Weeks     Status Not Met            Plan - 03/24/20 1826    Clinical Impression Statement Mr. Radin presents with at least cont'd moderate cognitive deficits s/p CVA. Pt had history of cognitive deficits prior to CVA, and was undergoing w/u for dementia prior to hospitalization per MD notes.  These prior deficits make achieving progress in ST difficult. Prior infarct and white matter changes are noted on an MRI from 2011. Patient is using his calendar and memory book to record prospective and recall past events with occasional min cues. Cues remain necessary for awareness and higher level attention. Pt and wife agree they would like to continue working on tasks at home, and for today to be last day of ST. SLP set up home cognitive program for pt and provided verbal and written instructions on recommendations going forward. Have educated that due to cognitive deficits if pt is hopeful to drive SLP agrees with OT that OT driving evaluation is advised.    Speech Therapy Frequency --   d/c   Duration --   d/c   Treatment/Interventions Compensatory strategies;Functional tasks;Patient/family education;Cognitive reorganization;Multimodal communcation approach;SLP instruction and feedback;Internal/external aids;Language facilitation;Compensatory techniques    Potential to Achieve Goals Fair    Potential Considerations Previous level of function    SLP Home Exercise Plan Get planner or binder for pt for memory notebook    Consulted and Agree with Plan of Care Patient;Family member/caregiver           Patient will benefit from skilled therapeutic intervention in order to improve the following deficits and impairments:   Cognitive communication deficit    Problem List Patient Active Problem List   Diagnosis Date Noted  . 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 History of repair of dissecting thoracic aneurysm   . Abdominal distension   . Pulmonary embolus ([place])  10/19/2019  . CVA (cerebral vascular accident) ([place]) 10/19/2019  . AKI (acute kidney injury) ([place]) 10/19/2019  . 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 Ileus ([place]) 10/19/2019  . 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 Radiculopathy 12/15/2014   SPEECH THERAPY DISCHARGE SUMMARY  Visits from Start of Care: 15  Current functional level related to goals / functional outcomes: See goals above. Prior deficits limited progress toward goals. Pt is functionally using memory compensation system with occasional min cues.   Remaining deficits: Moderate cognitive deficits persist   Education / Equipment: OT driving evaluation, cognitive activities for home, compensations for conversations Plan: Patient agrees to discharge.  Patient goals were partially met. Patient is being discharged due to being pleased with the current functional level.  ?????   Deneise Lever, [place], CCC-SLP Speech-Language Pathologist            Aliene Altes 03/24/2020, 6:32 PM  [facility] [address] [place], [place], [address] Phone: [phone]   Fax:  [phone]   Name: [name] [name] MRN: [id number] Date of Birth: 22-Sep-1945

## 2020-03-31 ENCOUNTER — Encounter: Payer: Medicare Other | Admitting: Speech Pathology

## 2020-04-06 ENCOUNTER — Telehealth: Payer: Self-pay | Admitting: *Deleted

## 2020-04-06 NOTE — Telephone Encounter (Signed)
Pt called triage line to discuss his driving status. Notified pt that from a vascular standpoint he is clear to drive. However, occupational therapy recommended a formal driving evaluation after his CVA. Advised pt to contact OT with questions regarding the driving evaluation.

## 2020-04-28 ENCOUNTER — Ambulatory Visit: Payer: Medicare Other | Admitting: Speech Pathology

## 2020-05-07 ENCOUNTER — Emergency Department (HOSPITAL_COMMUNITY): Payer: Medicare Other

## 2020-05-07 ENCOUNTER — Inpatient Hospital Stay (HOSPITAL_COMMUNITY)
Admission: EM | Admit: 2020-05-07 | Discharge: 2020-05-09 | DRG: 689 | Disposition: A | Discharge status: Home Health | Payer: Medicare Other | Attending: Family Medicine | Admitting: Family Medicine

## 2020-05-07 ENCOUNTER — Other Ambulatory Visit: Payer: Self-pay

## 2020-05-07 DIAGNOSIS — Z20822 Contact with and (suspected) exposure to covid-19: Secondary | ICD-10-CM | POA: Diagnosis present

## 2020-05-07 DIAGNOSIS — M549 Dorsalgia, unspecified: Secondary | ICD-10-CM | POA: Diagnosis present

## 2020-05-07 DIAGNOSIS — R4182 Altered mental status, unspecified: Secondary | ICD-10-CM | POA: Diagnosis present

## 2020-05-07 DIAGNOSIS — E119 Type 2 diabetes mellitus without complications: Secondary | ICD-10-CM

## 2020-05-07 DIAGNOSIS — Z7984 Long term (current) use of oral hypoglycemic drugs: Secondary | ICD-10-CM

## 2020-05-07 DIAGNOSIS — K219 Gastro-esophageal reflux disease without esophagitis: Secondary | ICD-10-CM | POA: Diagnosis present

## 2020-05-07 DIAGNOSIS — G8929 Other chronic pain: Secondary | ICD-10-CM | POA: Diagnosis present

## 2020-05-07 DIAGNOSIS — I10 Essential (primary) hypertension: Secondary | ICD-10-CM | POA: Diagnosis present

## 2020-05-07 DIAGNOSIS — B9620 Unspecified Escherichia coli [E. coli] as the cause of diseases classified elsewhere: Secondary | ICD-10-CM | POA: Diagnosis present

## 2020-05-07 DIAGNOSIS — Z981 Arthrodesis status: Secondary | ICD-10-CM

## 2020-05-07 DIAGNOSIS — R404 Transient alteration of awareness: Secondary | ICD-10-CM | POA: Diagnosis not present

## 2020-05-07 DIAGNOSIS — E1140 Type 2 diabetes mellitus with diabetic neuropathy, unspecified: Secondary | ICD-10-CM | POA: Diagnosis present

## 2020-05-07 DIAGNOSIS — E1122 Type 2 diabetes mellitus with diabetic chronic kidney disease: Secondary | ICD-10-CM | POA: Diagnosis present

## 2020-05-07 DIAGNOSIS — N390 Urinary tract infection, site not specified: Secondary | ICD-10-CM | POA: Diagnosis present

## 2020-05-07 DIAGNOSIS — I129 Hypertensive chronic kidney disease with stage 1 through stage 4 chronic kidney disease, or unspecified chronic kidney disease: Secondary | ICD-10-CM | POA: Diagnosis present

## 2020-05-07 DIAGNOSIS — Z8673 Personal history of transient ischemic attack (TIA), and cerebral infarction without residual deficits: Secondary | ICD-10-CM

## 2020-05-07 DIAGNOSIS — G9341 Metabolic encephalopathy: Secondary | ICD-10-CM | POA: Diagnosis present

## 2020-05-07 DIAGNOSIS — I639 Cerebral infarction, unspecified: Secondary | ICD-10-CM | POA: Diagnosis present

## 2020-05-07 DIAGNOSIS — Z79899 Other long term (current) drug therapy: Secondary | ICD-10-CM

## 2020-05-07 DIAGNOSIS — D696 Thrombocytopenia, unspecified: Secondary | ICD-10-CM | POA: Diagnosis present

## 2020-05-07 DIAGNOSIS — N400 Enlarged prostate without lower urinary tract symptoms: Secondary | ICD-10-CM | POA: Diagnosis present

## 2020-05-07 DIAGNOSIS — N1831 Chronic kidney disease, stage 3a: Secondary | ICD-10-CM | POA: Diagnosis present

## 2020-05-07 DIAGNOSIS — G9340 Encephalopathy, unspecified: Secondary | ICD-10-CM

## 2020-05-07 DIAGNOSIS — Z87442 Personal history of urinary calculi: Secondary | ICD-10-CM

## 2020-05-07 DIAGNOSIS — E876 Hypokalemia: Secondary | ICD-10-CM | POA: Diagnosis present

## 2020-05-07 DIAGNOSIS — E785 Hyperlipidemia, unspecified: Secondary | ICD-10-CM | POA: Diagnosis present

## 2020-05-07 DIAGNOSIS — D649 Anemia, unspecified: Secondary | ICD-10-CM | POA: Diagnosis present

## 2020-05-07 DIAGNOSIS — Z7982 Long term (current) use of aspirin: Secondary | ICD-10-CM

## 2020-05-07 DIAGNOSIS — Z87891 Personal history of nicotine dependence: Secondary | ICD-10-CM

## 2020-05-07 DIAGNOSIS — N183 Chronic kidney disease, stage 3 unspecified: Secondary | ICD-10-CM | POA: Diagnosis present

## 2020-05-07 LAB — DIFFERENTIAL
Abs Immature Granulocytes: 0.05 10*3/uL (ref 0.00–0.07)
Basophils Absolute: 0 10*3/uL (ref 0.0–0.1)
Basophils Relative: 0 %
Eosinophils Absolute: 0.1 10*3/uL (ref 0.0–0.5)
Eosinophils Relative: 1 %
Immature Granulocytes: 0 %
Lymphocytes Relative: 7 %
Lymphs Abs: 0.9 10*3/uL (ref 0.7–4.0)
Monocytes Absolute: 1.2 10*3/uL — ABNORMAL HIGH (ref 0.1–1.0)
Monocytes Relative: 9 %
Neutro Abs: 10.9 10*3/uL — ABNORMAL HIGH (ref 1.7–7.7)
Neutrophils Relative %: 83 %

## 2020-05-07 LAB — COMPREHENSIVE METABOLIC PANEL
ALT: 15 U/L (ref 0–44)
AST: 17 U/L (ref 15–41)
Albumin: 3.8 g/dL (ref 3.5–5.0)
Alkaline Phosphatase: 63 U/L (ref 38–126)
Anion gap: 11 (ref 5–15)
BUN: 12 mg/dL (ref 8–23)
CO2: 24 mmol/L (ref 22–32)
Calcium: 9.3 mg/dL (ref 8.9–10.3)
Chloride: 106 mmol/L (ref 98–111)
Creatinine, Ser: 1.36 mg/dL — ABNORMAL HIGH (ref 0.61–1.24)
GFR calc Af Amer: 59 mL/min — ABNORMAL LOW (ref >60)
GFR calc non Af Amer: 51 mL/min — ABNORMAL LOW (ref >60)
Glucose, Bld: 128 mg/dL — ABNORMAL HIGH (ref 70–99)
Potassium: 3.8 mmol/L (ref 3.5–5.1)
Sodium: 141 mmol/L (ref 135–145)
Total Bilirubin: 1.3 mg/dL — ABNORMAL HIGH (ref 0.3–1.2)
Total Protein: 6.7 g/dL (ref 6.5–8.1)

## 2020-05-07 LAB — URINALYSIS, ROUTINE W REFLEX MICROSCOPIC
Bilirubin Urine: NEGATIVE
Glucose, UA: NEGATIVE mg/dL
Hgb urine dipstick: NEGATIVE
Ketones, ur: NEGATIVE mg/dL
Nitrite: POSITIVE — AB
Protein, ur: 30 mg/dL — AB
Specific Gravity, Urine: 1.026 (ref 1.005–1.030)
WBC, UA: >50 WBC/hpf — ABNORMAL HIGH (ref 0–5)
pH: 5 (ref 5.0–8.0)

## 2020-05-07 LAB — CBC
HCT: 36.8 % — ABNORMAL LOW (ref 39.0–52.0)
Hemoglobin: 11.2 g/dL — ABNORMAL LOW (ref 13.0–17.0)
MCH: 27.6 pg (ref 26.0–34.0)
MCHC: 30.4 g/dL (ref 30.0–36.0)
MCV: 90.6 fL (ref 80.0–100.0)
Platelets: 120 10*3/uL — ABNORMAL LOW (ref 150–400)
RBC: 4.06 MIL/uL — ABNORMAL LOW (ref 4.22–5.81)
RDW: 15.3 % (ref 11.5–15.5)
WBC: 13.2 10*3/uL — ABNORMAL HIGH (ref 4.0–10.5)
nRBC: 0 % (ref 0.0–0.2)

## 2020-05-07 LAB — LACTIC ACID, PLASMA
Lactic Acid, Venous: 1.8 mmol/L (ref 0.5–1.9)
Lactic Acid, Venous: 1.8 mmol/L (ref 0.5–1.9)

## 2020-05-07 LAB — I-STAT CHEM 8, ED
BUN: 14 mg/dL (ref 8–23)
Calcium, Ion: 1.18 mmol/L (ref 1.15–1.40)
Chloride: 105 mmol/L (ref 98–111)
Creatinine, Ser: 1.3 mg/dL — ABNORMAL HIGH (ref 0.61–1.24)
Glucose, Bld: 119 mg/dL — ABNORMAL HIGH (ref 70–99)
HCT: 35 % — ABNORMAL LOW (ref 39.0–52.0)
Hemoglobin: 11.9 g/dL — ABNORMAL LOW (ref 13.0–17.0)
Potassium: 3.7 mmol/L (ref 3.5–5.1)
Sodium: 142 mmol/L (ref 135–145)
TCO2: 26 mmol/L (ref 22–32)

## 2020-05-07 LAB — APTT: aPTT: 34 seconds (ref 24–36)

## 2020-05-07 LAB — SARS CORONAVIRUS 2 BY RT PCR (HOSPITAL ORDER, PERFORMED IN ~~LOC~~ HOSPITAL LAB): SARS Coronavirus 2: NEGATIVE

## 2020-05-07 LAB — CBG MONITORING, ED
Glucose-Capillary: 106 mg/dL — ABNORMAL HIGH (ref 70–99)
Glucose-Capillary: 101 mg/dL — ABNORMAL HIGH (ref 70–99)
Glucose-Capillary: 107 mg/dL — ABNORMAL HIGH (ref 70–99)

## 2020-05-07 LAB — PROTIME-INR
INR: 1.2 (ref 0.8–1.2)
Prothrombin Time: 14.5 seconds (ref 11.4–15.2)

## 2020-05-07 MED ORDER — TAMSULOSIN HCL 0.4 MG PO CAPS
0.4000 mg | ORAL_CAPSULE | Freq: Every day | ORAL | Status: DC
  Administered 2020-05-08 – 2020-05-09 (×2): 0.4 mg via ORAL
  Filled 2020-05-07 (×2): qty 1

## 2020-05-07 MED ORDER — SODIUM CHLORIDE 0.9% FLUSH: 3.0000 mL | Freq: Once | INTRAVENOUS | Status: DC

## 2020-05-07 MED ORDER — ACETAMINOPHEN 650 MG RE SUPP: 650.0000 mg | Freq: Four times a day (QID) | RECTAL | Status: DC | PRN

## 2020-05-07 MED ORDER — ACETAMINOPHEN 325 MG PO TABS: 650.0000 mg | ORAL_TABLET | Freq: Four times a day (QID) | ORAL | Status: DC | PRN

## 2020-05-07 MED ORDER — ENOXAPARIN SODIUM 40 MG/0.4ML ~~LOC~~ SOLN
40.0000 mg | SUBCUTANEOUS | Status: DC
  Administered 2020-05-07 – 2020-05-08 (×2): 40 mg via SUBCUTANEOUS
  Filled 2020-05-07 (×2): qty 0.4

## 2020-05-07 MED ORDER — ASPIRIN EC 81 MG PO TBEC
81.0000 mg | DELAYED_RELEASE_TABLET | Freq: Every day | ORAL | Status: DC
  Administered 2020-05-08 – 2020-05-09 (×2): 81 mg via ORAL
  Filled 2020-05-07 (×2): qty 1

## 2020-05-07 MED ORDER — PANTOPRAZOLE SODIUM 40 MG PO TBEC
40.0000 mg | DELAYED_RELEASE_TABLET | Freq: Every day | ORAL | Status: DC
  Administered 2020-05-08 – 2020-05-09 (×2): 40 mg via ORAL
  Filled 2020-05-07 (×2): qty 1

## 2020-05-07 MED ORDER — ROSUVASTATIN CALCIUM 5 MG PO TABS
10.0000 mg | ORAL_TABLET | Freq: Every day | ORAL | Status: DC
  Administered 2020-05-08 – 2020-05-09 (×2): 10 mg via ORAL
  Filled 2020-05-07 (×2): qty 2

## 2020-05-07 MED ORDER — SODIUM CHLORIDE 0.9 % IV BOLUS
1000.0000 mL | Freq: Once | INTRAVENOUS | Status: AC
  Administered 2020-05-07: 1000 mL via INTRAVENOUS

## 2020-05-07 MED ORDER — SODIUM CHLORIDE 0.9 % IV SOLN
1.0000 g | INTRAVENOUS | Status: DC
  Administered 2020-05-08: 1 g via INTRAVENOUS
  Filled 2020-05-07 (×2): qty 10

## 2020-05-07 MED ORDER — AMLODIPINE BESYLATE 2.5 MG PO TABS
2.5000 mg | ORAL_TABLET | Freq: Every day | ORAL | Status: DC
  Administered 2020-05-08 – 2020-05-09 (×2): 2.5 mg via ORAL
  Filled 2020-05-07 (×2): qty 1

## 2020-05-07 MED ORDER — GABAPENTIN 300 MG PO CAPS
300.0000 mg | ORAL_CAPSULE | Freq: Two times a day (BID) | ORAL | Status: DC
  Administered 2020-05-07 – 2020-05-09 (×4): 300 mg via ORAL
  Filled 2020-05-07 (×4): qty 1

## 2020-05-07 MED ORDER — CARVEDILOL 25 MG PO TABS
25.0000 mg | ORAL_TABLET | Freq: Two times a day (BID) | ORAL | Status: DC
  Administered 2020-05-08 – 2020-05-09 (×3): 25 mg via ORAL
  Filled 2020-05-07 (×2): qty 1
  Filled 2020-05-07: qty 2

## 2020-05-07 MED ORDER — SODIUM CHLORIDE 0.9 % IV SOLN
1.0000 g | Freq: Once | INTRAVENOUS | Status: AC
  Administered 2020-05-07: 1 g via INTRAVENOUS
  Filled 2020-05-07: qty 10

## 2020-05-07 MED ORDER — INSULIN ASPART 100 UNIT/ML ~~LOC~~ SOLN
0.0000 [IU] | Freq: Three times a day (TID) | SUBCUTANEOUS | Status: DC
  Administered 2020-05-08: 2 [IU] via SUBCUTANEOUS
  Administered 2020-05-08 – 2020-05-09 (×2): 3 [IU] via SUBCUTANEOUS

## 2020-05-07 MED ORDER — INSULIN ASPART 100 UNIT/ML ~~LOC~~ SOLN: 0.0000 [IU] | Freq: Every day | SUBCUTANEOUS | Status: DC

## 2020-05-07 MED ORDER — ONDANSETRON HCL 4 MG PO TABS: 4.0000 mg | ORAL_TABLET | Freq: Four times a day (QID) | ORAL | Status: DC | PRN

## 2020-05-07 MED ORDER — ONDANSETRON HCL 4 MG/2ML IJ SOLN: 4.0000 mg | Freq: Four times a day (QID) | INTRAMUSCULAR | Status: DC | PRN

## 2020-05-07 MED ORDER — HYDRALAZINE HCL 10 MG PO TABS
10.0000 mg | ORAL_TABLET | Freq: Four times a day (QID) | ORAL | Status: DC
  Administered 2020-05-07 – 2020-05-09 (×8): 10 mg via ORAL
  Filled 2020-05-07 (×8): qty 1

## 2020-05-07 MED ORDER — HYDRALAZINE HCL 20 MG/ML IJ SOLN
10.0000 mg | Freq: Four times a day (QID) | INTRAMUSCULAR | Status: DC | PRN
  Administered 2020-05-09: 10 mg via INTRAVENOUS
  Filled 2020-05-07: qty 1

## 2020-05-07 NOTE — ED Provider Notes (Signed)
[name] Provider Note   CSN: [id number] Arrival date & time: 05/07/20  1516     History Chief Complaint  Patient presents with  . Altered Mental Status  . Weakness    [name] [name] is a 74 y.o. male.  HPI 74 year old male presents with altered mental status.  History is taken from the wife at the bedside.  Last seen normal at 1 AM.  Since waking up, around 7 AM, patient has been confused.  He cannot seem to do typical tasks and it took him a very long time for the wife to help him take a shower.  He seemed to have a shaking arm when he was trying to eat this morning.  Has been confused and some slurred speech.  To the wife this seems very similar to when he had a stroke earlier this year.  He has not complained of anything including pain.  He seems generally weak bilaterally but no focal weakness.  Has not had a fever, cough, vomiting.   Past Medical History:  Diagnosis Date  . Chronic back pain    stenosis  . Enlarged prostate   . GERD (gastroesophageal reflux disease)    occasionally will take a zantac(maybe once a month)  . History of colon polyps   . History of kidney stones   . History of stress test    done 10 yrs. ago, as a baseline   . Hyperlipidemia    takes Crestor daily  . Hypertension    takes Amlodipine daily and Lotensin as well  . Stroke [facility])     Patient Active Problem List   Diagnosis Date Noted  . Hypertension   . Hyperlipidemia   . GERD (gastroesophageal reflux disease)   . DM (diabetes mellitus) ([place])   . CKD (chronic kidney disease), stage III   . Normocytic anemia   . Thrombocytopenia ([place])   . UTI (urinary tract infection)   . AMS (altered mental status)   . 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 History of repair of dissecting thoracic aneurysm   . Abdominal distension   . Pulmonary embolus ([place]) 10/19/2019  . CVA (cerebral vascular accident) ([place]) 10/19/2019   . AKI (acute kidney injury) ([place]) 10/19/2019  . 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 Ileus ([place]) 10/19/2019  . 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 Radiculopathy 12/15/2014    Past Surgical History:  Procedure Laterality Date  . ANTERIOR LAT LUMBAR FUSION Left 12/15/2014   Procedure: ANTERIOR LATERAL LUMBAR FUSION 1 LEVEL;  Surgeon: Phylliss Bob, MD;  Location: [facility];  Service: Orthopedics;  Laterality: Left;  Left sided lateral lumbar interbody fusion, lumbar 3-4, posterior spinal fusion, lumbar 3-4 with instrumentation.  . COLONOSCOPY    . ESOPHAGOGASTRODUODENOSCOPY    . fatty tissue removed from stomach    . LUMBAR LAMINECTOMY/DECOMPRESSION MICRODISCECTOMY N/A 07/08/2013   Procedure: LUMBAR LAMINECTOMY/DECOMPRESSION MICRODISCECTOMY;  Surgeon: Sinclair Ship, MD;  Location: [facility];  Service: Orthopedics;  Laterality: N/A;  Lumbar 3-4, lumbar 4-5 decompression  . RADIOLOGY WITH ANESTHESIA N/A 10/22/2019   Procedure: MRI WITH ANESTHESIA OF LUMBAR SPINE WITHOUT CONTRAST;  Surgeon: Radiologist, Medication, MD;  Location: [facility];  Service: Radiology;  Laterality: N/A;  . right ankle surgery     as child   . THORACIC AORTIC ENDOVASCULAR STENT GRAFT N/A 10/15/2019  Procedure: THORACIC AORTIC ENDOVASCULAR STENT GRAFT;  Surgeon: Waynetta Sandy, MD;  Location: [facility];  Service: Vascular;  Laterality: N/A;  . TONSILLECTOMY     as a child       No family history on file.  Social History   Tobacco Use  . Smoking status: Former Smoker    Packs/day: 0.25    Years: 40.00    Pack years: 10.00    Types: Cigarettes  . Smokeless tobacco: Never Used  . Tobacco comment: none since 06/24/13  Vaping Use  . Vaping Use: Never used  Substance Use Topics  . Alcohol use: No  . Drug use: No    Home Medications Prior to Admission  medications   Medication Sig Start Date End Date Taking? Authorizing Provider  acetaminophen (TYLENOL) 500 MG tablet Take 1,000 mg by mouth every 6 (six) hours as needed for headache (pain).   Yes [provider]  amLODipine (NORVASC) 2.5 MG tablet Take 2.5 mg by mouth daily.  02/15/20  Yes [provider]  Ascorbic Acid (VITAMIN C) 500 MG CAPS Take 500 mg by mouth daily.    Yes [provider]  aspirin EC 81 MG tablet Take 81 mg by mouth daily.   Yes [provider]  Calcium Carbonate-Vitamin D (CALCIUM-D PO) Take 1 tablet by mouth daily.   Yes [provider]  carvedilol (COREG) 25 MG tablet Take 1 tablet (25 mg total) by mouth 2 (two) times daily with a meal. 11/12/19  Yes Angiulli, Lavon Paganini, PA-C  Cyanocobalamin (VITAMIN B-12 PO) Take 1 tablet by mouth daily.   Yes [provider]  gabapentin (NEURONTIN) 300 MG capsule Take 300 mg by mouth 2 (two) times daily. 04/25/20  Yes [provider]  hydrALAZINE (APRESOLINE) 10 MG tablet Take 1 tablet (10 mg total) by mouth every 6 (six) hours. 11/12/19  Yes Angiulli, Lavon Paganini, PA-C  magnesium citrate SOLN Take 1 Bottle by mouth daily as needed for severe constipation.    Yes [provider]  metFORMIN (GLUCOPHAGE) 500 MG tablet Take 500 mg by mouth 2 (two) times daily. 12/29/19  Yes [provider]  Multiple Vitamin (MULTIVITAMIN WITH MINERALS) TABS tablet Take 1 tablet by mouth daily.   Yes [provider]  pantoprazole (PROTONIX) 40 MG tablet Take 1 tablet (40 mg total) by mouth daily. 11/12/19  Yes Angiulli, Lavon Paganini, PA-C  rosuvastatin (CRESTOR) 10 MG tablet Take 1 tablet (10 mg total) by mouth daily. 11/12/19  Yes Angiulli, Lavon Paganini, PA-C  tamsulosin (FLOMAX) 0.4 MG CAPS capsule Take 1 capsule (0.4 mg total) by mouth daily. 11/12/19  Yes Angiulli, Lavon Paganini, PA-C    Allergies    Lyrica [pregabalin] and Sulfa antibiotics  Review of Systems   Review of Systems   Constitutional: Negative for fever.  Respiratory: Negative for cough.   Cardiovascular: Negative for chest pain.  Gastrointestinal: Negative for abdominal pain.  Neurological: Positive for speech difficulty and weakness.  All other systems reviewed and are negative.   Physical Exam Updated Vital Signs BP (!) 181/80   Pulse 63   Temp 98.6 F (37 C) (Oral)   Resp (!) 21   Ht 5\' 11"  (1.803 m)   Wt 90 kg   SpO2 99%   BMI 27.67 kg/m   Physical Exam Vitals and nursing note reviewed.  Constitutional:      Appearance: He is well-developed. He is obese.  HENT:     Head: Normocephalic and atraumatic.  Right Ear: External ear normal.     Left Ear: External ear normal.     Nose: Nose normal.  Eyes:     General:        Right eye: No discharge.        Left eye: No discharge.     Extraocular Movements: Extraocular movements intact.     Pupils: Pupils are equal, round, and reactive to light.  Cardiovascular:     Rate and Rhythm: Normal rate and regular rhythm.     Heart sounds: Murmur heard.   Pulmonary:     Effort: Pulmonary effort is normal.     Breath sounds: Normal breath sounds.  Abdominal:     Palpations: Abdomen is soft.     Tenderness: There is no abdominal tenderness.  Musculoskeletal:     Cervical back: Neck supple.  Skin:    General: Skin is warm and dry.  Neurological:     Mental Status: He is alert.     Comments: Awake, alert, oriented to [place], place, month and year (disoriented to day of week). CN 3-12 grossly intact. 5/5 strength in bilateral upper extremities. 4/5 strength in BLE. Grossly normal sensation. Normal finger to nose.   Psychiatric:        Mood and Affect: Mood is not anxious.     ED Results / Procedures / Treatments   Labs (all labs ordered are listed, but only abnormal results are displayed) Labs Reviewed  CBC - Abnormal; Notable for the following components:      Result Value   WBC 13.2 (*)    RBC 4.06 (*)    Hemoglobin 11.2 (*)     HCT 36.8 (*)    Platelets 120 (*)    All other components within normal limits  DIFFERENTIAL - Abnormal; Notable for the following components:   Neutro Abs 10.9 (*)    Monocytes Absolute 1.2 (*)    All other components within normal limits  COMPREHENSIVE METABOLIC PANEL - Abnormal; Notable for the following components:   Glucose, Bld 128 (*)    Creatinine, Ser 1.36 (*)    Total Bilirubin 1.3 (*)    GFR calc non Af Amer 51 (*)    GFR calc Af Amer 59 (*)    All other components within normal limits  URINALYSIS, ROUTINE W REFLEX MICROSCOPIC - Abnormal; Notable for the following components:   Color, Urine AMBER (*)    APPearance HAZY (*)    Protein, ur 30 (*)    Nitrite POSITIVE (*)    Leukocytes,Ua SMALL (*)    WBC, UA >50 (*)    Bacteria, UA MANY (*)    All other components within normal limits  CBG MONITORING, ED - Abnormal; Notable for the following components:   Glucose-Capillary 106 (*)    All other components within normal limits  I-STAT CHEM 8, ED - Abnormal; Notable for the following components:   Creatinine, Ser 1.30 (*)    Glucose, Bld 119 (*)    Hemoglobin 11.9 (*)    HCT 35.0 (*)    All other components within normal limits  CBG MONITORING, ED - Abnormal; Notable for the following components:   Glucose-Capillary 101 (*)    All other components within normal limits  CBG MONITORING, ED - Abnormal; Notable for the following components:   Glucose-Capillary 107 (*)    All other components within normal limits  SARS CORONAVIRUS 2 BY RT PCR (HOSPITAL ORDER, [facility])  URINE  CULTURE  PROTIME-INR  APTT  LACTIC ACID, PLASMA  LACTIC ACID, PLASMA  CBC  CREATININE, SERUM  MAGNESIUM  PHOSPHORUS  TSH  COMPREHENSIVE METABOLIC PANEL  CBC  HEMOGLOBIN A1C    EKG EKG Interpretation  Date/Time:  Saturday May 07 2020 15:22:22 EDT Ventricular Rate:  68 PR Interval:    QRS Duration: 81 QT Interval:  407 QTC Calculation: 433 R  Axis:   27 Text Interpretation: Sinus rhythm Borderline T wave abnormalities no significant change since Feb 2021 Confirmed by Sherwood Gambler [phone]) on 05/07/2020 3:58:31 PM   Radiology DG Chest 2 View  Result Date: 05/07/2020 CLINICAL DATA:  Acute mental status change. EXAM: CHEST - 2 VIEW COMPARISON:  October 25, 2019 FINDINGS: The aortic stent is stable. The cardiomediastinal silhouette is unchanged. Left retrocardiac opacity is improved but persists. No pneumothorax. The remainder of the lungs are clear. No other acute abnormalities. IMPRESSION: No acute interval changes. Persistent but improved left retrocardiac opacity, possibly atelectasis. Electronically Signed   By: Dorise Bullion III M.D   On: 05/07/2020 16:22   CT HEAD WO CONTRAST  Result Date: 05/07/2020 CLINICAL DATA:  Altered mental status generalized weakness EXAM: CT HEAD WITHOUT CONTRAST TECHNIQUE: Contiguous axial images were obtained from the base of the skull through the vertex without intravenous contrast. COMPARISON:  None. FINDINGS: Brain: No evidence of acute territorial infarction, hemorrhage, hydrocephalus,extra-axial collection or mass lesion/mass effect. There is dilatation the ventricles and sulci consistent with age-related atrophy. Low-attenuation changes in the deep white matter consistent with small vessel ischemia. Vascular: No hyperdense vessel or unexpected calcification. Skull: The skull is intact. No fracture or focal lesion identified. Sinuses/Orbits: The visualized paranasal sinuses and mastoid air cells are clear. The orbits and globes intact. Other: None IMPRESSION: No acute intracranial abnormality. Findings consistent with age related atrophy and chronic small vessel ischemia Electronically Signed   By: Prudencio Pair M.D.   On: 05/07/2020 16:58    Procedures Procedures (including critical care time)  Medications Ordered in ED Medications  sodium chloride flush (NS) 0.9 % injection 3 mL (3 mLs Intravenous  Not Given 05/07/20 1726)  enoxaparin (LOVENOX) injection 40 mg (40 mg Subcutaneous Given 05/07/20 2035)  acetaminophen (TYLENOL) tablet 650 mg (has no administration in time range)    Or  acetaminophen (TYLENOL) suppository 650 mg (has no administration in time range)  ondansetron (ZOFRAN) tablet 4 mg (has no administration in time range)    Or  ondansetron (ZOFRAN) injection 4 mg (has no administration in time range)  hydrALAZINE (APRESOLINE) injection 10 mg (has no administration in time range)  cefTRIAXone (ROCEPHIN) 1 g in sodium chloride 0.9 % 100 mL IVPB (has no administration in time range)  insulin aspart (novoLOG) injection 0-15 Units (has no administration in time range)  insulin aspart (novoLOG) injection 0-5 Units (0 Units Subcutaneous Not Given 05/07/20 2134)  aspirin EC tablet 81 mg (has no administration in time range)  amLODipine (NORVASC) tablet 2.5 mg (has no administration in time range)  carvedilol (COREG) tablet 25 mg (has no administration in time range)  hydrALAZINE (APRESOLINE) tablet 10 mg (10 mg Oral Given 05/07/20 1940)  rosuvastatin (CRESTOR) tablet 10 mg (has no administration in time range)  pantoprazole (PROTONIX) EC tablet 40 mg (has no administration in time range)  tamsulosin (FLOMAX) capsule 0.4 mg (has no administration in time range)  gabapentin (NEURONTIN) capsule 300 mg (300 mg Oral Given 05/07/20 2135)  cefTRIAXone (ROCEPHIN) 1 g in sodium chloride 0.9 % 100 mL IVPB (  Intravenous Stopped 05/07/20 1832)  sodium chloride 0.9 % bolus 1,000 mL (0 mLs Intravenous Stopped 05/07/20 2135)    ED Course  I have reviewed the triage vital signs and the nursing notes.  Pertinent labs & imaging results that were available during my care of the patient were reviewed by me and considered in my medical decision making (see chart for details).    MDM Rules/Calculators/A&P                          Patient's imaging has been reviewed, shows chronic findings only. Labs  show leukocytosis and UTI. No signs of sepsis. However, he is altered, likely from the UTI, and thus will need admission and IV antibiotics. Hospitalist to admit.  Final Clinical Impression(s) / ED Diagnoses Final diagnoses:  Acute urinary tract infection  Encephalopathy    Rx / DC Orders ED Discharge Orders    None       Sherwood Gambler, MD 05/07/20 2316

## 2020-05-07 NOTE — ED Triage Notes (Signed)
Reported last seen normal at 0130 in the morning; woke up at 1000 am and has weakness on bilateral legs and confusion. 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

## 2020-05-07 NOTE — H&P (Signed)
History and Physical    [name] [name] QJF:[id number] DOB: 1946-04-23 DOA: 05/07/2020  PCP: Wenda Low, MD  Patient coming from: home  I have personally briefly reviewed patient's old medical records in [place]  Chief Complaint: AMS started this am  HPI: [name] [name] is a 74 y.o. male with medical history significant of hypertension, hyperlipidemia, diabetes mellitus, GERD, chronic back pain, CVA presents to emergency department for evaluation of AMS.  Patient wife at the bedside is the historian.  She tells me that patient was confused and was not acting like himself this morning such as he was incoherent, could not remember anything, could not take shower by himself, had tremors in his hands while he was trying to eat breakfast and had some slurred speech and fell asleep on the chair.  Wife was concerned about his stroke therefore she brought him to the emergency department for further evaluation and management.  Upon my evaluation: Patient lying comfortably on the bed.  Alert and oriented x3, communicating well, reports increase urinary frequency since 2 to 3 weeks especially at nighttime, denies headache, blurry vision, loss of consciousness, head trauma, seizures, lightheadedness, dizziness, fall, chest pain, shortness of breath, palpitation, leg swelling, fever, chills, nausea, vomiting, abdominal pain, hematuria, foul-smelling urine, dysuria.  He lives with his wife at home.  Uses Rollator/walker for ambulation at home.  No history of smoking, alcohol, illicit drug use.  ED Course: On arrival to ED: Patient heart rate: 58, blood pressure: 150/86, afebrile with leukocytosis of 13.2, CBC shows normocytic anemia at baseline, platelet: 120, CMP shows CKD stage III at baseline, total bilirubin: 1.3, UA positive for protein, nitrites, leukocyte, WBC, bacteria, lactic acid, COVID-19, urine culture: Pending.  Chest x-ray and CT head was obtained which came back negative for acute  findings.  Patient was given IV fluid and Rocephin in ED.  [facility] hospitalist consulted for admission for AMS likely secondary to UTI.  Review of Systems: As per HPI otherwise negative.    Past Medical History:  Diagnosis Date  . Chronic back pain    stenosis  . Enlarged prostate   . GERD (gastroesophageal reflux disease)    occasionally will take a zantac(maybe once a month)  . History of colon polyps   . History of kidney stones   . History of stress test    done 10 yrs. ago, as a baseline   . Hyperlipidemia    takes Crestor daily  . Hypertension    takes Amlodipine daily and Lotensin as well  . Stroke [name])     Past Surgical History:  Procedure Laterality Date  . ANTERIOR LAT LUMBAR FUSION Left 12/15/2014   Procedure: ANTERIOR LATERAL LUMBAR FUSION 1 LEVEL;  Surgeon: Phylliss Bob, MD;  Location: [place];  Service: Orthopedics;  Laterality: Left;  Left sided lateral lumbar interbody fusion, lumbar 3-4, posterior spinal fusion, lumbar 3-4 with instrumentation.  . COLONOSCOPY    . ESOPHAGOGASTRODUODENOSCOPY    . fatty tissue removed from stomach    . LUMBAR LAMINECTOMY/DECOMPRESSION MICRODISCECTOMY N/A 07/08/2013   Procedure: LUMBAR LAMINECTOMY/DECOMPRESSION MICRODISCECTOMY;  Surgeon: Sinclair Ship, MD;  Location: [place];  Service: Orthopedics;  Laterality: N/A;  Lumbar 3-4, lumbar 4-5 decompression  . RADIOLOGY WITH ANESTHESIA N/A 10/22/2019   Procedure: MRI WITH ANESTHESIA OF LUMBAR SPINE WITHOUT CONTRAST;  Surgeon: Radiologist, Medication, MD;  Location: [place];  Service: Radiology;  Laterality: N/A;  . right ankle surgery     as child   . THORACIC  AORTIC ENDOVASCULAR STENT GRAFT N/A 10/15/2019   Procedure: THORACIC AORTIC ENDOVASCULAR STENT GRAFT;  Surgeon: Waynetta Sandy, MD;  Location: [place];  Service: Vascular;  Laterality: N/A;  . TONSILLECTOMY     as a child     reports that he has quit smoking. His smoking use included cigarettes. He has a 10.00  pack-year smoking history. He has never used smokeless tobacco. He reports that he does not drink alcohol and does not use drugs.  Allergies  Allergen Reactions  . Lyrica [Pregabalin] Nausea Only and Other (See Comments)    Hallucinations and dizziness, also  . Sulfa Antibiotics Other (See Comments)    Reaction not recalled     No family history on file.  Prior to Admission medications   Medication Sig Start Date End Date Taking? Authorizing Provider  acetaminophen (TYLENOL) 325 MG tablet Take 2 tablets (650 mg total) by mouth every 6 (six) hours as needed for moderate pain. 11/12/19   Angiulli, Lavon Paganini, PA-C  amLODipine (NORVASC) 2.5 MG tablet Take 2.5 mg by mouth every morning. 02/15/20   [provider]  amLODipine (NORVASC) 5 MG tablet Take 1 tablet (5 mg total) by mouth daily. 11/13/19   Angiulli, Lavon Paganini, PA-C  Ascorbic Acid (VITAMIN C) 500 MG CAPS Take 500-1,000 mg by mouth daily.    [provider]  aspirin EC 81 MG tablet Take 81 mg by mouth daily.    [provider]  CALCIUM PO Take 1 tablet by mouth daily.    [provider]  carvedilol (COREG) 25 MG tablet Take 1 tablet (25 mg total) by mouth 2 (two) times daily with a meal. 11/12/19   Angiulli, Lavon Paganini, PA-C  Cyanocobalamin (VITAMIN B-12 PO) Take 1 tablet by mouth daily.    [provider]  gabapentin (NEURONTIN) 600 MG tablet Take 0.5 tablets (300 mg total) by mouth at bedtime. 11/12/19   Angiulli, Lavon Paganini, PA-C  hydrALAZINE (APRESOLINE) 10 MG tablet Take 1 tablet (10 mg total) by mouth every 6 (six) hours. 11/12/19   Angiulli, Lavon Paganini, PA-C  magnesium citrate SOLN 1 Bottle as needed for severe constipation.    [provider]  metFORMIN (GLUCOPHAGE) 500 MG tablet Take 500 mg by mouth 2 (two) times daily. 12/29/19   [provider]  pantoprazole (PROTONIX) 40 MG tablet Take 1 tablet (40 mg total) by mouth daily. 11/12/19   Angiulli, Lavon Paganini, PA-C  rosuvastatin  (CRESTOR) 10 MG tablet Take 1 tablet (10 mg total) by mouth daily. 11/12/19   Angiulli, Lavon Paganini, PA-C  tamsulosin (FLOMAX) 0.4 MG CAPS capsule Take 1 capsule (0.4 mg total) by mouth daily. 11/12/19   Cathlyn Parsons, PA-C    Physical Exam: Vitals:   05/07/20 1529 05/07/20 1535 05/07/20 1600 05/07/20 1715  BP: (!) 153/86  (!) 157/81 (!) 150/108  Pulse: 85  85 (!) 59  Resp: 18  (!) 26 12  Temp: 98.6 F (37 C)     TempSrc: Oral     SpO2: 99%  98% 99%  Weight:  90 kg    Height:  5\' 11"  (1.803 m)      Constitutional: NAD, calm, comfortable, on room air, communicating well Eyes: PERRL, lids and conjunctivae normal ENMT: Mucous membranes are moist. Posterior pharynx clear of any exudate or lesions.Normal dentition.  Neck: normal, supple, no masses, no thyromegaly Respiratory: clear to auscultation bilaterally, no wheezing, no crackles. Normal respiratory effort. No accessory muscle use.  Cardiovascular: Regular  rate and rhythm, no murmurs / rubs / gallops. No extremity edema. 2+ pedal pulses. No carotid bruits.  Abdomen: no tenderness, no masses palpated. No hepatosplenomegaly. Bowel sounds positive.  Musculoskeletal: no clubbing / cyanosis. No joint deformity upper and lower extremities. Good ROM, no contractures. Normal muscle tone.  Skin: no rashes, lesions, ulcers. No induration Neurologic: CN 2-12 grossly intact. Sensation intact, DTR normal. Strength 5/5 in all 4.  Psychiatric: Normal judgment and insight. Alert and oriented x 3. Normal mood.    Labs on Admission: I have personally reviewed following labs and imaging studies  CBC: Recent Labs  Lab 05/07/20 1531 05/07/20 1726  WBC 13.2*  --   NEUTROABS 10.9*  --   HGB 11.2* 11.9*  HCT 36.8* 35.0*  MCV 90.6  --   PLT 120*  --    Basic Metabolic Panel: Recent Labs  Lab 05/07/20 1531 05/07/20 1726  NA 141 142  K 3.8 3.7  CL 106 105  CO2 24  --   GLUCOSE 128* 119*  BUN 12 14  CREATININE 1.36* 1.30*  CALCIUM 9.3   --    GFR: Estimated Creatinine Clearance: 52.3 mL/min (A) (by C-G formula based on SCr of 1.3 mg/dL (H)). Liver Function Tests: Recent Labs  Lab 05/07/20 1531  AST 17  ALT 15  ALKPHOS 63  BILITOT 1.3*  PROT 6.7  ALBUMIN 3.8   No results for input(s): LIPASE, AMYLASE in the last 168 hours. No results for input(s): AMMONIA in the last 168 hours. Coagulation Profile: Recent Labs  Lab 05/07/20 1531  INR 1.2   Cardiac Enzymes: No results for input(s): CKTOTAL, CKMB, CKMBINDEX, TROPONINI in the last 168 hours. BNP (last 3 results) No results for input(s): PROBNP in the last 8760 hours. HbA1C: No results for input(s): HGBA1C in the last 72 hours. CBG: Recent Labs  Lab 05/07/20 1529  GLUCAP 106*   Lipid Profile: No results for input(s): CHOL, HDL, LDLCALC, TRIG, CHOLHDL, LDLDIRECT in the last 72 hours. Thyroid Function Tests: No results for input(s): TSH, T4TOTAL, FREET4, T3FREE, THYROIDAB in the last 72 hours. Anemia Panel: No results for input(s): VITAMINB12, FOLATE, FERRITIN, TIBC, IRON, RETICCTPCT in the last 72 hours. Urine analysis:    Component Value Date/Time   COLORURINE AMBER (A) 05/07/2020 1706   APPEARANCEUR HAZY (A) 05/07/2020 1706   LABSPEC 1.026 05/07/2020 1706   PHURINE 5.0 05/07/2020 1706   GLUCOSEU NEGATIVE 05/07/2020 1706   HGBUR NEGATIVE 05/07/2020 1706   BILIRUBINUR NEGATIVE 05/07/2020 1706   KETONESUR NEGATIVE 05/07/2020 1706   PROTEINUR 30 (A) 05/07/2020 1706   UROBILINOGEN 1.0 12/10/2014 1103   NITRITE POSITIVE (A) 05/07/2020 1706   LEUKOCYTESUR SMALL (A) 05/07/2020 1706    Radiological Exams on Admission: DG Chest 2 View  Result Date: 05/07/2020 CLINICAL DATA:  Acute mental status change. EXAM: CHEST - 2 VIEW COMPARISON:  October 25, 2019 FINDINGS: The aortic stent is stable. The cardiomediastinal silhouette is unchanged. Left retrocardiac opacity is improved but persists. No pneumothorax. The remainder of the lungs are clear. No other acute  abnormalities. IMPRESSION: No acute interval changes. Persistent but improved left retrocardiac opacity, possibly atelectasis. Electronically Signed   By: Dorise Bullion III M.D   On: 05/07/2020 16:22   CT HEAD WO CONTRAST  Result Date: 05/07/2020 CLINICAL DATA:  Altered mental status generalized weakness EXAM: CT HEAD WITHOUT CONTRAST TECHNIQUE: Contiguous axial images were obtained from the base of the skull through the vertex without intravenous contrast. COMPARISON:  None. FINDINGS: Brain:  No evidence of acute territorial infarction, hemorrhage, hydrocephalus,extra-axial collection or mass lesion/mass effect. There is dilatation the ventricles and sulci consistent with age-related atrophy. Low-attenuation changes in the deep white matter consistent with small vessel ischemia. Vascular: No hyperdense vessel or unexpected calcification. Skull: The skull is intact. No fracture or focal lesion identified. Sinuses/Orbits: The visualized paranasal sinuses and mastoid air cells are clear. The orbits and globes intact. Other: None IMPRESSION: No acute intracranial abnormality. Findings consistent with age related atrophy and chronic small vessel ischemia Electronically Signed   By: Prudencio Pair M.D.   On: 05/07/2020 16:58    Assessment/Plan Principal Problem:   AMS (altered mental status) Active Problems:   CVA (cerebral vascular accident) ([place])   Hypertension   Hyperlipidemia   GERD (gastroesophageal reflux disease)   DM (diabetes mellitus) ([place])   CKD (chronic kidney disease), stage III   Normocytic anemia   Thrombocytopenia ([facility])   UTI (urinary tract infection)   AMS: Likely secondary to underlying urinary tract infection.  Patient reports increased urinary frequency since 2 to 3 weeks.  UA is positive for infection.  He is afebrile with leukocytosis of 13.2, lactic acid, urine culture: Pending.  Chest x-ray and CT head negative for acute findings.  COVID-19 is pending. -Received IV fluid and  Rocephin in ED. -Admit patient on the floor.  On telemetry.  Admit under observation.  Continue IV Rocephin. -Follow urine culture result. -Consult PT/OT/SLP.  We will keep him n.p.o. until he passes bedside swallow evaluation. -Frequent neuro checks. -On fall precautions.  Hypertension: Blood pressure stable -Continue home meds amlodipine, Coreg and hydralazine.  Monitor blood pressure closely.  Hyperlipidemia: Continue statin  GERD: Continue PPI  BPH: Continue Flomax -We will check bladder scan for urinary retention  Diabetes mellitus: Hold Metformin, started patient on sliding scale insulin.  Monitor blood sugar closely. -Last A1c checked 7 months ago was 6.7%.  Repeat A1c today.  CVA: Continue aspirin, statin.  Diabetic neuropathy: Continue gabapentin  Normocytic anemia: H&H is stable.  Continue to monitor  Thrombocytopenia: Platelet 120 -No signs of active bleeding.  Repeat CBC tomorrow a.m.  CKD stage IIIa: At baseline -Continue to monitor.   DVT prophylaxis: Lovenox/SCD Code Status: Full code Family Communication: Patient's wife present at bedside.  Plan of care discussed with patient in length and he verbalized understanding and agreed with it. Disposition Plan: Likely home tomorrow Consults called: None Admission status: Observation   Mckinley Jewel MD [facility]  If 7PM-7AM, please contact night-coverage [email or web address] Password [facility]  05/07/2020, 6:15 PM

## 2020-05-08 ENCOUNTER — Encounter (HOSPITAL_COMMUNITY): Payer: Self-pay | Admitting: Family Medicine

## 2020-05-08 DIAGNOSIS — Z87891 Personal history of nicotine dependence: Secondary | ICD-10-CM | POA: Diagnosis not present

## 2020-05-08 DIAGNOSIS — D696 Thrombocytopenia, unspecified: Secondary | ICD-10-CM | POA: Diagnosis present

## 2020-05-08 DIAGNOSIS — G8929 Other chronic pain: Secondary | ICD-10-CM | POA: Diagnosis present

## 2020-05-08 DIAGNOSIS — E876 Hypokalemia: Secondary | ICD-10-CM | POA: Diagnosis present

## 2020-05-08 DIAGNOSIS — N1831 Chronic kidney disease, stage 3a: Secondary | ICD-10-CM | POA: Diagnosis present

## 2020-05-08 DIAGNOSIS — K219 Gastro-esophageal reflux disease without esophagitis: Secondary | ICD-10-CM | POA: Diagnosis present

## 2020-05-08 DIAGNOSIS — E1140 Type 2 diabetes mellitus with diabetic neuropathy, unspecified: Secondary | ICD-10-CM | POA: Diagnosis present

## 2020-05-08 DIAGNOSIS — Z7984 Long term (current) use of oral hypoglycemic drugs: Secondary | ICD-10-CM | POA: Diagnosis not present

## 2020-05-08 DIAGNOSIS — Z20822 Contact with and (suspected) exposure to covid-19: Secondary | ICD-10-CM | POA: Diagnosis present

## 2020-05-08 DIAGNOSIS — E785 Hyperlipidemia, unspecified: Secondary | ICD-10-CM | POA: Diagnosis present

## 2020-05-08 DIAGNOSIS — B9620 Unspecified Escherichia coli [E. coli] as the cause of diseases classified elsewhere: Secondary | ICD-10-CM | POA: Diagnosis present

## 2020-05-08 DIAGNOSIS — I129 Hypertensive chronic kidney disease with stage 1 through stage 4 chronic kidney disease, or unspecified chronic kidney disease: Secondary | ICD-10-CM | POA: Diagnosis present

## 2020-05-08 DIAGNOSIS — Z7982 Long term (current) use of aspirin: Secondary | ICD-10-CM | POA: Diagnosis not present

## 2020-05-08 DIAGNOSIS — E119 Type 2 diabetes mellitus without complications: Secondary | ICD-10-CM

## 2020-05-08 DIAGNOSIS — M549 Dorsalgia, unspecified: Secondary | ICD-10-CM | POA: Diagnosis present

## 2020-05-08 DIAGNOSIS — G9340 Encephalopathy, unspecified: Secondary | ICD-10-CM | POA: Diagnosis not present

## 2020-05-08 DIAGNOSIS — Z87442 Personal history of urinary calculi: Secondary | ICD-10-CM | POA: Diagnosis not present

## 2020-05-08 DIAGNOSIS — E1122 Type 2 diabetes mellitus with diabetic chronic kidney disease: Secondary | ICD-10-CM | POA: Diagnosis present

## 2020-05-08 DIAGNOSIS — Z981 Arthrodesis status: Secondary | ICD-10-CM | POA: Diagnosis not present

## 2020-05-08 DIAGNOSIS — G9341 Metabolic encephalopathy: Secondary | ICD-10-CM | POA: Diagnosis present

## 2020-05-08 DIAGNOSIS — I10 Essential (primary) hypertension: Secondary | ICD-10-CM

## 2020-05-08 DIAGNOSIS — N390 Urinary tract infection, site not specified: Principal | ICD-10-CM

## 2020-05-08 DIAGNOSIS — D649 Anemia, unspecified: Secondary | ICD-10-CM | POA: Diagnosis present

## 2020-05-08 DIAGNOSIS — Z8673 Personal history of transient ischemic attack (TIA), and cerebral infarction without residual deficits: Secondary | ICD-10-CM | POA: Diagnosis not present

## 2020-05-08 DIAGNOSIS — Z79899 Other long term (current) drug therapy: Secondary | ICD-10-CM | POA: Diagnosis not present

## 2020-05-08 DIAGNOSIS — N400 Enlarged prostate without lower urinary tract symptoms: Secondary | ICD-10-CM | POA: Diagnosis present

## 2020-05-08 LAB — CBC
HCT: 37.7 % — ABNORMAL LOW (ref 39.0–52.0)
Hemoglobin: 11.8 g/dL — ABNORMAL LOW (ref 13.0–17.0)
MCH: 27.5 pg (ref 26.0–34.0)
MCHC: 31.3 g/dL (ref 30.0–36.0)
MCV: 87.9 fL (ref 80.0–100.0)
Platelets: 124 10*3/uL — ABNORMAL LOW (ref 150–400)
RBC: 4.29 MIL/uL (ref 4.22–5.81)
RDW: 15.3 % (ref 11.5–15.5)
WBC: 10.3 10*3/uL (ref 4.0–10.5)
nRBC: 0 % (ref 0.0–0.2)

## 2020-05-08 LAB — COMPREHENSIVE METABOLIC PANEL
ALT: 12 U/L (ref 0–44)
AST: 15 U/L (ref 15–41)
Albumin: 3.4 g/dL — ABNORMAL LOW (ref 3.5–5.0)
Alkaline Phosphatase: 68 U/L (ref 38–126)
Anion gap: 11 (ref 5–15)
BUN: 12 mg/dL (ref 8–23)
CO2: 23 mmol/L (ref 22–32)
Calcium: 9.3 mg/dL (ref 8.9–10.3)
Chloride: 105 mmol/L (ref 98–111)
Creatinine, Ser: 1.27 mg/dL — ABNORMAL HIGH (ref 0.61–1.24)
GFR calc Af Amer: >60 mL/min (ref >60)
GFR calc non Af Amer: 55 mL/min — ABNORMAL LOW (ref >60)
Glucose, Bld: 139 mg/dL — ABNORMAL HIGH (ref 70–99)
Potassium: 3.3 mmol/L — ABNORMAL LOW (ref 3.5–5.1)
Sodium: 139 mmol/L (ref 135–145)
Total Bilirubin: 1.1 mg/dL (ref 0.3–1.2)
Total Protein: 6.5 g/dL (ref 6.5–8.1)

## 2020-05-08 LAB — CBG MONITORING, ED
Glucose-Capillary: 106 mg/dL — ABNORMAL HIGH (ref 70–99)
Glucose-Capillary: 124 mg/dL — ABNORMAL HIGH (ref 70–99)

## 2020-05-08 LAB — GLUCOSE, CAPILLARY: Glucose-Capillary: 152 mg/dL — ABNORMAL HIGH (ref 70–99)

## 2020-05-08 MED ORDER — POTASSIUM CHLORIDE CRYS ER 20 MEQ PO TBCR
40.0000 meq | EXTENDED_RELEASE_TABLET | Freq: Once | ORAL | Status: AC
  Administered 2020-05-08: 40 meq via ORAL
  Filled 2020-05-08: qty 2

## 2020-05-08 NOTE — ED Notes (Signed)
Condom cath put on pt

## 2020-05-08 NOTE — ED Notes (Signed)
Report report received from Sandria Senter, RN.

## 2020-05-08 NOTE — Evaluation (Signed)
Clinical/Bedside Swallow Evaluation Patient Details  Name: [name] [name] MRN: [id number] Date of Birth: 21-Oct-1945  Today's Date: 05/08/2020 Time: SLP Start Time (ACUTE ONLY): 1027 SLP Stop Time (ACUTE ONLY): 1039 SLP Time Calculation (min) (ACUTE ONLY): 12 min  Past Medical History:  Past Medical History:  Diagnosis Date  . Chronic back pain    stenosis  . Enlarged prostate   . GERD (gastroesophageal reflux disease)    occasionally will take a zantac(maybe once a month)  . History of colon polyps   . History of kidney stones   . History of stress test    done 10 yrs. ago, as a baseline   . Hyperlipidemia    takes Crestor daily  . Hypertension    takes Amlodipine daily and Lotensin as well  . Stroke [facility])    Past Surgical History:  Past Surgical History:  Procedure Laterality Date  . ANTERIOR LAT LUMBAR FUSION Left 12/15/2014   Procedure: ANTERIOR LATERAL LUMBAR FUSION 1 LEVEL;  Surgeon: Phylliss Bob, MD;  Location: [place];  Service: Orthopedics;  Laterality: Left;  Left sided lateral lumbar interbody fusion, lumbar 3-4, posterior spinal fusion, lumbar 3-4 with instrumentation.  . COLONOSCOPY    . ESOPHAGOGASTRODUODENOSCOPY    . fatty tissue removed from stomach    . LUMBAR LAMINECTOMY/DECOMPRESSION MICRODISCECTOMY N/A 07/08/2013   Procedure: LUMBAR LAMINECTOMY/DECOMPRESSION MICRODISCECTOMY;  Surgeon: Sinclair Ship, MD;  Location: [place];  Service: Orthopedics;  Laterality: N/A;  Lumbar 3-4, lumbar 4-5 decompression  . RADIOLOGY WITH ANESTHESIA N/A 10/22/2019   Procedure: MRI WITH ANESTHESIA OF LUMBAR SPINE WITHOUT CONTRAST;  Surgeon: Radiologist, Medication, MD;  Location: [place];  Service: Radiology;  Laterality: N/A;  . right ankle surgery     as child   . THORACIC AORTIC ENDOVASCULAR STENT GRAFT N/A 10/15/2019   Procedure: THORACIC AORTIC ENDOVASCULAR STENT GRAFT;  Surgeon: Waynetta Sandy, MD;  Location: [place];  Service: Vascular;  Laterality: N/A;  .  TONSILLECTOMY     as a child   HPI:  74 year old male with history of hypertension, hyperlipidemia, diabetes mellitus type 2, GERD, chronic back pain, CVA was brought to the ED with altered mental status.  Patient found to have abnormal UA started on IV ceftriaxone for UTI.   Assessment / Plan / Recommendation Clinical Impression  Patient presents with normal oropharyngeal swallowing function. No evidence of dysphagia or aspiration. No SLP f/u indicated.  SLP Visit Diagnosis: Dysphagia, unspecified (R13.10)    Aspiration Risk       Diet Recommendation Regular;Thin liquid   Liquid Administration via: Cup;Straw Medication Administration: Whole meds with liquid Supervision: Patient able to self feed Compensations: Small sips/bites Postural Changes: Seated upright at 90 degrees    Other  Recommendations Oral Care Recommendations: Oral care BID   Follow up Recommendations None        Swallow Study   General HPI: 74 year old male with history of hypertension, hyperlipidemia, diabetes mellitus type 2, GERD, chronic back pain, CVA was brought to the ED with altered mental status.  Patient found to have abnormal UA started on IV ceftriaxone for UTI. Type of Study: Bedside Swallow Evaluation Previous Swallow Assessment: none Diet Prior to this Study: NPO Temperature Spikes Noted: No Respiratory Status: Room air History of Recent Intubation: No Behavior/Cognition: Alert;Cooperative;Pleasant mood Oral Cavity Assessment: Within Functional Limits Oral Care Completed by SLP: No Oral Cavity - Dentition: Adequate natural dentition Vision: Functional for self-feeding Self-Feeding Abilities: Able to feed self Patient Positioning: Upright in bed Baseline  Vocal Quality: Normal Volitional Cough: Strong Volitional Swallow: Able to elicit    Oral/Motor/Sensory Function Overall Oral Motor/Sensory Function: Within functional limits   Ice Chips Ice chips: Not tested   Thin Liquid Thin Liquid:  Within functional limits Presentation: Straw;Self Fed    Nectar Thick Nectar Thick Liquid: Not tested   Honey Thick Honey Thick Liquid: Not tested   Puree Puree: Within functional limits Presentation: Self Fed;Spoon   Solid     Solid: Within functional limits Presentation: [place] MA, CCC-SLP   [name] [name] 05/08/2020,10:46 AM

## 2020-05-08 NOTE — Evaluation (Signed)
Physical Therapy Evaluation Patient Details Name: [name] [name] MRN: [id number] DOB: [birth year] Today's Date: 05/08/2020   History of Present Illness  74 year old male with history of hypertension, hyperlipidemia, diabetes mellitus type 2, diabetic neuropathy, GERD, chronic back pain, CVA was brought to the ED with altered mental status. +UTI, CT head negative  Clinical Impression   Pt admitted with above diagnosis. Patient currently with decr cognition/memory (?baseline) and requiring min assist for balance (even with use of RW). Patient reports that wife works as Educational psychologist in [employer] and he usually gets himself up, makes his breakfast, and is home alone until afternoon when she returns (?accuracy).  Pt currently with functional limitations due to the deficits listed below (see PT Problem List). Pt will benefit from skilled PT to increase their independence and safety with mobility to allow discharge to the venue listed below.       Follow Up Recommendations SNF;Supervision/Assistance - 24 hour (if family can provide 24/7, could go home with HHPT)    Equipment Recommendations  None recommended by PT    Recommendations for Other Services OT consult     Precautions / Restrictions Precautions Precautions: Fall      Mobility  Bed Mobility Overal bed mobility: Needs Assistance Bed Mobility: Supine to Sit;Sit to Supine     Supine to sit: Min assist Sit to supine: Min assist   General bed mobility comments: incr time/effort to raise torso with need for assist  Transfers Overall transfer level: Needs assistance Equipment used: Rolling walker (2 wheeled) Transfers: Sit to/from Stand Sit to Stand: Min assist         General transfer comment: vc each time for proper hand placement (x 3); assist due to posterior lean   Ambulation/Gait Ambulation/Gait assistance: Min assist Gait Distance (Feet): 20 Feet Assistive device: Rolling walker (2 wheeled) Gait Pattern/deviations:  Step-to pattern;Decreased stride length;Shuffle     General Gait Details: limited distance due to dizziness (BP WNL pre-and post- ambulation)  Stairs            Wheelchair Mobility    Modified Rankin (Stroke Patients Only)       Balance Overall balance assessment: Needs assistance Sitting-balance support: No upper extremity supported;Feet supported Sitting balance-Leahy Scale: Fair   Postural control: Posterior lean Standing balance support: Bilateral upper extremity supported Standing balance-Leahy Scale: Poor Standing balance comment: posterior lean each transition to standing and incr time to achieve upright                             Pertinent Vitals/Pain Pain Assessment: 0-10 Pain Score: 5  Pain Location: back Pain Descriptors / Indicators: Aching Pain Intervention(s): Limited activity within patient's tolerance;Monitored during session;Repositioned    Home Living Family/patient expects to be discharged to:: Private residence Living Arrangements: Spouse/significant other Available Help at Discharge: Family;Available PRN/intermittently (wife works Theatre manager then) Type of Home: [employer] Access: Stairs to enter Entrance Stairs-Rails: None Technical brewer of Steps: 1 Home Layout: Multi-level Home Equipment: Environmental consultant - 2 wheels;Transport chair;Walker - 4 wheels;Shower seat;Hand held shower head Additional Comments: Patient's bedroom is on the second level, can stay in a bedroom on the first floor    Prior Function Level of Independence: Independent with assistive device(s)         Comments: pt likes to play electric guitar, sing and be with his grandchildren and great grandchildren     Hand Dominance   Dominant Hand: Right  Extremity/Trunk Assessment   Upper Extremity Assessment Upper Extremity Assessment: Defer to OT evaluation    Lower Extremity Assessment Lower Extremity Assessment: Overall WFL for tasks  assessed    Cervical / Trunk Assessment Cervical / Trunk Assessment: Normal  Communication   Communication: No difficulties  Cognition Arousal/Alertness: Awake/alert Behavior During Therapy: Flat affect Overall Cognitive Status: Impaired/Different from baseline Area of Impairment: Orientation;Memory;Problem solving                 Orientation Level: Situation (despite discussed x 4, could not recall)   Memory: Decreased short-term memory       Problem Solving: Slow processing;Decreased initiation;Requires tactile cues;Requires verbal cues General Comments: patient repeating information re: wife's job and home alone      General Comments General comments (skin integrity, edema, etc.): supine BP 160/86, HR 58, sats 100%; after ambulation 168/94    Exercises     Assessment/Plan    PT Assessment Patient needs continued PT services  PT Problem List Decreased activity tolerance;Decreased balance;Decreased mobility;Decreased cognition;Decreased knowledge of use of DME;Decreased safety awareness       PT Treatment Interventions      PT Goals (Current goals can be found in the Care Plan section)  Acute Rehab PT Goals Patient Stated Goal: get better PT Goal Formulation: With patient Time For Goal Achievement: 05/22/20 Potential to Achieve Goals: Good    Frequency Min 3X/week   Barriers to discharge Decreased caregiver support      Co-evaluation               AM-PAC PT "6 Clicks" Mobility  Outcome Measure Help needed turning from your back to your side while in a flat bed without using bedrails?: A Little Help needed moving from lying on your back to sitting on the side of a flat bed without using bedrails?: A Little Help needed moving to and from a bed to a chair (including a wheelchair)?: A Little Help needed standing up from a chair using your arms (e.g., wheelchair or bedside chair)?: A Little Help needed to walk in hospital room?: A Little Help needed  climbing 3-5 steps with a railing? : A Little 6 Click Score: 18    End of Session Equipment Utilized During Treatment: Gait belt Activity Tolerance: Treatment limited secondary to medical complications (Comment) (dizziness) Patient left: in bed;with call bell/phone within reach (on ED stretcher) Nurse Communication: Mobility status;Other (comment) (condom catheter fell off) PT Visit Diagnosis: Difficulty in walking, not elsewhere classified (R26.2);Dizziness and giddiness (R42)    Time: 7616-0737 PT Time Calculation (min) (ACUTE ONLY): 39 min   Charges:   PT Evaluation $PT Eval Low Complexity: 1 Low           Arby Barrette, PT Pager [phone]   Rexanne Mano 05/08/2020, 12:26 PM

## 2020-05-08 NOTE — ED Notes (Signed)
Speech therapist at pt bedside.

## 2020-05-08 NOTE — Progress Notes (Signed)
[facility]  PROGRESS NOTE  [name] [name] BDZ:[id number] DOB: 1946/01/08 DOA: 05/07/2020 PCP: Wenda Low, MD   Brief HPI:   74 year old male with history of hypertension, hyperlipidemia, diabetes mellitus type 2, GERD, chronic back pain, CVA was brought to the ED with altered mental status.  Patient found to have abnormal UA started on IV ceftriaxone for UTI.  Urine culture obtained.    Subjective   Patient seen and examined, he is alert, oriented to self, place and time.  Does not know why he is here.   Assessment/Plan:     1. Altered mental status-likely from underlying UTI, UA shows positive nitrate.  Patient started on IV Rocephin.  WBC was 13.2.  Follow urine culture results.  CT head is unremarkable.  PT OT consulted.  Fall precautions. 2. Hypertension-blood pressure stable, continue home medication including Coreg, amlodipine, hydralazine. 3. Hyperlipidemia-continue statin 4. GERD-continue Protonix 5. BPH-continue Flomax 6. Diabetes mellitus type 2-Metformin on hold, patient started on sliding scale insulin with NovoLog.  CBG well controlled. 7. History of CVA-continue aspirin, statin 8. Diabetic neuropathy-continue gabapentin      COVID-19 Labs  No results for input(s): DDIMER, FERRITIN, LDH, CRP in the last 72 hours.  Lab Results  Component Value Date   SARSCOV2NAA NEGATIVE 05/07/2020   [place] NEGATIVE 10/08/2019   [place] NEGATIVE 10/08/2019     Scheduled medications:   . amLODipine  2.5 mg Oral Daily  . aspirin EC  81 mg Oral Daily  . carvedilol  25 mg Oral BID WC  . enoxaparin (LOVENOX) injection  40 mg Subcutaneous Q24H  . gabapentin  300 mg Oral BID  . hydrALAZINE  10 mg Oral Q6H  . insulin aspart  0-15 Units Subcutaneous TID WC  . insulin aspart  0-5 Units Subcutaneous QHS  . pantoprazole  40 mg Oral Daily  . rosuvastatin  10 mg Oral Daily  . sodium chloride flush  3 mL Intravenous Once  . tamsulosin  0.4 mg Oral Daily          CBG: Recent Labs  Lab 05/07/20 1529 05/07/20 2132 05/07/20 2225  GLUCAP 106* 101* 107*    SpO2: 100 %    CBC: Recent Labs  Lab 05/07/20 1531 05/07/20 1726  WBC 13.2*  --   NEUTROABS 10.9*  --   HGB 11.2* 11.9*  HCT 36.8* 35.0*  MCV 90.6  --   PLT 120*  --     Basic Metabolic Panel: Recent Labs  Lab 05/07/20 1531 05/07/20 1726  NA 141 142  K 3.8 3.7  CL 106 105  CO2 24  --   GLUCOSE 128* 119*  BUN 12 14  CREATININE 1.36* 1.30*  CALCIUM 9.3  --      Liver Function Tests: Recent Labs  Lab 05/07/20 1531  AST 17  ALT 15  ALKPHOS 63  BILITOT 1.3*  PROT 6.7  ALBUMIN 3.8     Antibiotics: Anti-infectives (From admission, onward)   Start     Dose/Rate Route Frequency Ordered Stop   05/08/20 1800  cefTRIAXone (ROCEPHIN) 1 g in sodium chloride 0.9 % 100 mL IVPB        1 g 200 mL/hr over 30 Minutes Intravenous Every 24 hours 05/07/20 1815     05/07/20 1745  cefTRIAXone (ROCEPHIN) 1 g in sodium chloride 0.9 % 100 mL IVPB        1 g 200 mL/hr over 30 Minutes Intravenous  Once 05/07/20 1744 05/07/20 1832  DVT prophylaxis: Lovenox  Code Status: Full code  Family Communication: No family at bedside    Status is: Inpatient  Dispo: The patient is from: Home              Anticipated d/c is to: Home              Anticipated d/c date is: 05/10/2020              Patient currently not stable for discharge  Barrier to discharge-treating for UTI      Consultants:    Procedures:     Objective   Vitals:   05/08/20 0400 05/08/20 0500 05/08/20 0600 05/08/20 0730  BP: (!) 172/82 (!) 182/86 (!) 167/77 (!) 142/93  Pulse: (!) 53 (!) 52 (!) 52 (!) 55  Resp: (!) 21 19 (!) 21 (!) 24  Temp:    98.2 F (36.8 C)  TempSrc:    Oral  SpO2: 99% 97% 96% 100%  Weight:      Height:        Intake/Output Summary (Last 24 hours) at 05/08/2020 0816 Last data filed at 05/07/2020 2135 Gross per 24 hour  Intake 1100 ml  Output --  Net 1100  ml    09/17 1901 - 09/19 0700 In: 1100  Out: -   Filed Weights   05/07/20 1535  Weight: 90 kg    Physical Examination:    General: Appears in no acute distress  Cardiovascular: S1-S2, regular, no murmur auscultated  Respiratory: Clear to auscultation bilaterally  Abdomen: Abdomen is soft, nontender, no organomegaly  Extremities: No edema in the lower extremities  Neurologic: Alert, oriented x3, intact insight and judgment    Data Reviewed:   Recent Results (from the past 240 hour(s))  SARS Coronavirus 2 by RT PCR (hospital order, performed in [place] hospital lab) Nasopharyngeal Nasopharyngeal Swab     Status: None   Collection Time: 05/07/20  4:11 PM   Specimen: Nasopharyngeal Swab  Result Value Ref Range Status   SARS Coronavirus 2 NEGATIVE NEGATIVE Final    Comment: (NOTE) SARS-CoV-2 target nucleic acids are NOT DETECTED.  The SARS-CoV-2 RNA is generally detectable in upper and lower respiratory specimens during the acute phase of infection. The lowest concentration of SARS-CoV-2 viral copies this assay can detect is 250 copies / mL. A negative result does not preclude SARS-CoV-2 infection and should not be used as the sole basis for treatment or other patient management decisions.  A negative result may occur with improper specimen collection / handling, submission of specimen other than nasopharyngeal swab, presence of viral mutation(s) within the areas targeted by this assay, and inadequate number of viral copies (<250 copies / mL). A negative result must be combined with clinical observations, patient history, and epidemiological information.  Fact Sheet for Patients:   StrictlyIdeas.no  Fact Sheet for Healthcare Providers: BankingDealers.co.za  This test is not yet approved or  cleared by the [place] FDA and has been authorized for detection and/or diagnosis of SARS-CoV-2 by FDA under an  Emergency Use Authorization (EUA).  This EUA will remain in effect (meaning this test can be used) for the duration of the COVID-19 declaration under Section 564(b)(1) of the Act, 21 U.S.C. section 360bbb-3(b)(1), unless the authorization is terminated or revoked sooner.  Performed at [facility], [place] [address] [place], [place] [address]      Studies:  DG Chest 2 View  Result Date: 05/07/2020 CLINICAL DATA:  Acute mental  status change. EXAM: CHEST - 2 VIEW COMPARISON:  October 25, 2019 FINDINGS: The aortic stent is stable. The cardiomediastinal silhouette is unchanged. Left retrocardiac opacity is improved but persists. No pneumothorax. The remainder of the lungs are clear. No other acute abnormalities. IMPRESSION: No acute interval changes. Persistent but improved left retrocardiac opacity, possibly atelectasis. Electronically Signed   By: Dorise Bullion III M.D   On: 05/07/2020 16:22   CT HEAD WO CONTRAST  Result Date: 05/07/2020 CLINICAL DATA:  Altered mental status generalized weakness EXAM: CT HEAD WITHOUT CONTRAST TECHNIQUE: Contiguous axial images were obtained from the base of the skull through the vertex without intravenous contrast. COMPARISON:  None. FINDINGS: Brain: No evidence of acute territorial infarction, hemorrhage, hydrocephalus,extra-axial collection or mass lesion/mass effect. There is dilatation the ventricles and sulci consistent with age-related atrophy. Low-attenuation changes in the deep white matter consistent with small vessel ischemia. Vascular: No hyperdense vessel or unexpected calcification. Skull: The skull is intact. No fracture or focal lesion identified. Sinuses/Orbits: The visualized paranasal sinuses and mastoid air cells are clear. The orbits and globes intact. Other: None IMPRESSION: No acute intracranial abnormality. Findings consistent with age related atrophy and chronic small vessel ischemia Electronically Signed   By: Prudencio Pair M.D.   On:  05/07/2020 16:58       [place]   [facility] If 7PM-7AM, please contact night-coverage at [email or web address], Office  [phone]   05/08/2020, 8:16 AM  LOS: 0 days

## 2020-05-08 NOTE — ED Notes (Signed)
ED TO INPATIENT HANDOFF REPORT  ED Nurse Name and Phone #: Percell Locus, RN  S Name/Age/Gender [name] Marty 74 y.o. male Room/Bed: 035C/035C  Code Status   Code Status: Full Code  Home/SNF/Other Home Patient oriented to: self, place and situation Is this baseline? Yes   Triage Complete: Triage complete  Chief Complaint AMS (altered mental status) [R41.82] UTI (urinary tract infection) [N39.0]  Triage Note Reported last seen normal at 0130 in the morning; woke up at 1000 am and has weakness on bilateral legs and confusion. 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     Allergies Allergies  Allergen Reactions  . Lyrica [Pregabalin] Nausea Only and Other (See Comments)    Hallucinations and dizziness, also  . Sulfa Antibiotics Other (See Comments)    Unknown reaction    Level of Care/Admitting Diagnosis ED Disposition    ED Disposition Condition Comment   Admit  Hospital Area: [facility] [[id number]]  Level of Care: Med-Surg [16]  May admit patient to [facility] or [facility] if equivalent level of care is available:: Yes  Covid Evaluation: Confirmed COVID Negative  Diagnosis: UTI (urinary tract infection) [024097]  Admitting Physician: Loree Fee  Attending Physician: Oswald Hillock [[id number]]  Estimated length of stay: past midnight tomorrow  Certification:: I certify this patient will need inpatient services for at least 2 midnights       B Medical/Surgery History Past Medical History:  Diagnosis Date  . Chronic back pain    stenosis  . Enlarged prostate   . GERD (gastroesophageal reflux disease)    occasionally will take a zantac(maybe once a month)  . History of colon polyps   . History of kidney stones   . History of stress test    done 10 yrs. ago, as a baseline   . Hyperlipidemia    takes Crestor daily  . Hypertension    takes  Amlodipine daily and Lotensin as well  . Stroke [facility])    Past Surgical History:  Procedure Laterality Date  . ANTERIOR LAT LUMBAR FUSION Left 12/15/2014   Procedure: ANTERIOR LATERAL LUMBAR FUSION 1 LEVEL;  Surgeon: Phylliss Bob, MD;  Location: [place];  Service: Orthopedics;  Laterality: Left;  Left sided lateral lumbar interbody fusion, lumbar 3-4, posterior spinal fusion, lumbar 3-4 with instrumentation.  . COLONOSCOPY    . ESOPHAGOGASTRODUODENOSCOPY    . fatty tissue removed from stomach    . LUMBAR LAMINECTOMY/DECOMPRESSION MICRODISCECTOMY N/A 07/08/2013   Procedure: LUMBAR LAMINECTOMY/DECOMPRESSION MICRODISCECTOMY;  Surgeon: Sinclair Ship, MD;  Location: [place];  Service: Orthopedics;  Laterality: N/A;  Lumbar 3-4, lumbar 4-5 decompression  . RADIOLOGY WITH ANESTHESIA N/A 10/22/2019   Procedure: MRI WITH ANESTHESIA OF LUMBAR SPINE WITHOUT CONTRAST;  Surgeon: Radiologist, Medication, MD;  Location: [place];  Service: Radiology;  Laterality: N/A;  . right ankle surgery     as child   . THORACIC AORTIC ENDOVASCULAR STENT GRAFT N/A 10/15/2019   Procedure: THORACIC AORTIC ENDOVASCULAR STENT GRAFT;  Surgeon: Waynetta Sandy, MD;  Location: [place];  Service: Vascular;  Laterality: N/A;  . TONSILLECTOMY     as a child     A IV Location/Drains/Wounds Patient Lines/Drains/Airways Status    Active Line/Drains/Airways    Name Placement date Placement time Site Days   Peripheral IV 05/07/20 Right Antecubital 05/07/20  1530  Antecubital  1  External Urinary Catheter 05/07/20  2249  --  1   Incision (Closed) 10/15/19 Groin Right 10/15/19  1125   206   Incision (Closed) 10/15/19 Arm Left 10/15/19  1125   206          Intake/Output Last 24 hours  Intake/Output Summary (Last 24 hours) at 05/08/2020 1647 Last data filed at 05/07/2020 2135 Gross per 24 hour  Intake 1100 ml  Output --  Net 1100 ml    Labs/Imaging Results for orders placed or performed during the hospital  encounter of 05/07/20 (from the past 48 hour(s))  CBG monitoring, ED     Status: Abnormal   Collection Time: 05/07/20  3:29 PM  Result Value Ref Range   Glucose-Capillary 106 (H) 70 - 99 mg/dL    Comment: Glucose reference range applies only to samples taken after fasting for at least 8 hours.  Protime-INR     Status: None   Collection Time: 05/07/20  3:31 PM  Result Value Ref Range   Prothrombin Time 14.5 11.4 - 15.2 seconds   INR 1.2 0.8 - 1.2    Comment: (NOTE) INR goal varies based on device and disease states. Performed at [facility], [place] [address] [place], [place] [address]   APTT     Status: None   Collection Time: 05/07/20  3:31 PM  Result Value Ref Range   aPTT 34 24 - 36 seconds    Comment: Performed at [facility] [address] [place], [place] [address]  CBC     Status: Abnormal   Collection Time: 05/07/20  3:31 PM  Result Value Ref Range   WBC 13.2 (H) 4.0 - 10.5 K/uL   RBC 4.06 (L) 4.22 - 5.81 MIL/uL   Hemoglobin 11.2 (L) 13.0 - 17.0 g/dL   HCT 36.8 (L) 39 - 52 %   MCV 90.6 80.0 - 100.0 fL   MCH 27.6 26.0 - 34.0 pg   MCHC 30.4 30.0 - 36.0 g/dL   RDW 15.3 11.5 - 15.5 %   Platelets 120 (L) 150 - 400 K/uL    Comment: REPEATED TO VERIFY Immature Platelet Fraction may be clinically indicated, consider ordering this additional test [id number]    nRBC 0.0 0.0 - 0.2 %    Comment: Performed at [facility], [place] [address], [place], [place] [address]  Differential     Status: Abnormal   Collection Time: 05/07/20  3:31 PM  Result Value Ref Range   Neutrophils Relative % 83 %   Neutro Abs 10.9 (H) 1.7 - 7.7 K/uL   Lymphocytes Relative 7 %   Lymphs Abs 0.9 0.7 - 4.0 K/uL   Monocytes Relative 9 %   Monocytes Absolute 1.2 (H) 0 - 1 K/uL   Eosinophils Relative 1 %   Eosinophils Absolute 0.1 0 - 0 K/uL   Basophils Relative 0 %   Basophils Absolute 0.0 0 - 0 K/uL   Immature Granulocytes 0 %   Abs Immature Granulocytes 0.05 0.00 - 0.07 K/uL     Comment: Performed at [facility] [address], [place], [place] [address]  Comprehensive metabolic panel     Status: Abnormal   Collection Time: 05/07/20  3:31 PM  Result Value Ref Range   Sodium 141 135 - 145 mmol/L   Potassium 3.8 3.5 - 5.1 mmol/L   Chloride 106 98 - 111 mmol/L   CO2 24 22 - 32 mmol/L   Glucose, Bld 128 (  H) 70 - 99 mg/dL    Comment: Glucose reference range applies only to samples taken after fasting for at least 8 hours.   BUN 12 8 - 23 mg/dL   Creatinine, Ser 1.36 (H) 0.61 - 1.24 mg/dL   Calcium 9.3 8.9 - 10.3 mg/dL   Total Protein 6.7 6.5 - 8.1 g/dL   Albumin 3.8 3.5 - 5.0 g/dL   AST 17 15 - 41 U/L   ALT 15 0 - 44 U/L   Alkaline Phosphatase 63 38 - 126 U/L   Total Bilirubin 1.3 (H) 0.3 - 1.2 mg/dL   GFR calc non Af Amer 51 (L) >60 mL/min   GFR calc Af Amer 59 (L) >60 mL/min   Anion gap 11 5 - 15    Comment: Performed at [facility] [address] [place], [place] [address]  SARS Coronavirus 2 by RT PCR (hospital order, performed in [facility] hospital lab) Nasopharyngeal Nasopharyngeal Swab     Status: None   Collection Time: 05/07/20  4:11 PM   Specimen: Nasopharyngeal Swab  Result Value Ref Range   SARS Coronavirus 2 NEGATIVE NEGATIVE    Comment: (NOTE) SARS-CoV-2 target nucleic acids are NOT DETECTED.  The SARS-CoV-2 RNA is generally detectable in upper and lower respiratory specimens during the acute phase of infection. The lowest concentration of SARS-CoV-2 viral copies this assay can detect is 250 copies / mL. A negative result does not preclude SARS-CoV-2 infection and should not be used as the sole basis for treatment or other patient management decisions.  A negative result may occur with improper specimen collection / handling, submission of specimen other than nasopharyngeal swab, presence of viral mutation(s) within the areas targeted by this assay, and inadequate number of viral copies (<250 copies / mL). A negative  result must be combined with clinical observations, patient history, and epidemiological information.  Fact Sheet for Patients:   StrictlyIdeas.no  Fact Sheet for Healthcare Providers: BankingDealers.co.za  This test is not yet approved or  cleared by the [place] FDA and has been authorized for detection and/or diagnosis of SARS-CoV-2 by FDA under an Emergency Use Authorization (EUA).  This EUA will remain in effect (meaning this test can be used) for the duration of the COVID-19 declaration under Section 564(b)(1) of the Act, 21 U.S.C. section 360bbb-3(b)(1), unless the authorization is terminated or revoked sooner.  Performed at [facility], [place] [address] [place], [place] [address]   Urinalysis, Routine w reflex microscopic Nasopharyngeal Swab     Status: Abnormal   Collection Time: 05/07/20  5:06 PM  Result Value Ref Range   Color, Urine AMBER (A) YELLOW    Comment: BIOCHEMICALS MAY BE AFFECTED BY COLOR   APPearance HAZY (A) CLEAR   Specific Gravity, Urine 1.026 1.005 - 1.030   pH 5.0 5.0 - 8.0   Glucose, UA NEGATIVE NEGATIVE mg/dL   Hgb urine dipstick NEGATIVE NEGATIVE   Bilirubin Urine NEGATIVE NEGATIVE   Ketones, ur NEGATIVE NEGATIVE mg/dL   Protein, ur 30 (A) NEGATIVE mg/dL   Nitrite POSITIVE (A) NEGATIVE   Leukocytes,Ua SMALL (A) NEGATIVE   RBC / HPF 0-5 0 - 5 RBC/hpf   WBC, UA >50 (H) 0 - 5 WBC/hpf   Bacteria, UA MANY (A) NONE SEEN   Mucus PRESENT     Comment: Performed at [facility], [address], [place], [place] [address]  Urine culture     Status: Abnormal (Preliminary result)  Collection Time: 05/07/20  5:06 PM   Specimen: Urine, Random  Result Value Ref Range   Specimen Description URINE, RANDOM    Special Requests NONE    Culture (A)     >=100,000 COLONIES/mL ESCHERICHIA COLI SUSCEPTIBILITIES TO FOLLOW Performed at [facility], [place] [address] [place], [place] [address]     Report Status PENDING   I-stat chem 8, ED     Status: Abnormal   Collection Time: 05/07/20  5:26 PM  Result Value Ref Range   Sodium 142 135 - 145 mmol/L   Potassium 3.7 3.5 - 5.1 mmol/L   Chloride 105 98 - 111 mmol/L   BUN 14 8 - 23 mg/dL   Creatinine, Ser 1.30 (H) 0.61 - 1.24 mg/dL   Glucose, Bld 119 (H) 70 - 99 mg/dL    Comment: Glucose reference range applies only to samples taken after fasting for at least 8 hours.   Calcium, Ion 1.18 1.15 - 1.40 mmol/L   TCO2 26 22 - 32 mmol/L   Hemoglobin 11.9 (L) 13.0 - 17.0 g/dL   HCT 35.0 (L) 39 - 52 %  Lactic acid, plasma     Status: None   Collection Time: 05/07/20  5:54 PM  Result Value Ref Range   Lactic Acid, Venous 1.8 0.5 - 1.9 mmol/L    Comment: Performed at [facility] [address], [place], [place] [address]  Lactic acid, plasma     Status: None   Collection Time: 05/07/20  5:54 PM  Result Value Ref Range   Lactic Acid, Venous 1.8 0.5 - 1.9 mmol/L    Comment: Performed at [facility] [address] [place], [place] [address]  CBG monitoring, ED     Status: Abnormal   Collection Time: 05/07/20  9:32 PM  Result Value Ref Range   Glucose-Capillary 101 (H) 70 - 99 mg/dL    Comment: Glucose reference range applies only to samples taken after fasting for at least 8 hours.  CBG monitoring, ED     Status: Abnormal   Collection Time: 05/07/20 10:25 PM  Result Value Ref Range   Glucose-Capillary 107 (H) 70 - 99 mg/dL    Comment: Glucose reference range applies only to samples taken after fasting for at least 8 hours.  CBG monitoring, ED     Status: Abnormal   Collection Time: 05/08/20  9:19 AM  Result Value Ref Range   Glucose-Capillary 106 (H) 70 - 99 mg/dL    Comment: Glucose reference range applies only to samples taken after fasting for at least 8 hours.   Comment 1 Notify RN    Comment 2 Document in Chart   CBG monitoring, ED     Status: Abnormal   Collection Time: 05/08/20 11:53 AM  Result Value Ref Range    Glucose-Capillary 124 (H) 70 - 99 mg/dL    Comment: Glucose reference range applies only to samples taken after fasting for at least 8 hours.   DG Chest 2 View  Result Date: 05/07/2020 CLINICAL DATA:  Acute mental status change. EXAM: CHEST - 2 VIEW COMPARISON:  October 25, 2019 FINDINGS: The aortic stent is stable. The cardiomediastinal silhouette is unchanged. Left retrocardiac opacity is improved but persists. No pneumothorax. The remainder of the lungs are clear. No other acute abnormalities. IMPRESSION: No acute interval changes. Persistent but improved left retrocardiac opacity, possibly atelectasis. Electronically Signed   By: Dorise Bullion III M.D   On: 05/07/2020 16:22  CT HEAD WO CONTRAST  Result Date: 05/07/2020 CLINICAL DATA:  Altered mental status generalized weakness EXAM: CT HEAD WITHOUT CONTRAST TECHNIQUE: Contiguous axial images were obtained from the base of the skull through the vertex without intravenous contrast. COMPARISON:  None. FINDINGS: Brain: No evidence of acute territorial infarction, hemorrhage, hydrocephalus,extra-axial collection or mass lesion/mass effect. There is dilatation the ventricles and sulci consistent with age-related atrophy. Low-attenuation changes in the deep white matter consistent with small vessel ischemia. Vascular: No hyperdense vessel or unexpected calcification. Skull: The skull is intact. No fracture or focal lesion identified. Sinuses/Orbits: The visualized paranasal sinuses and mastoid air cells are clear. The orbits and globes intact. Other: None IMPRESSION: No acute intracranial abnormality. Findings consistent with age related atrophy and chronic small vessel ischemia Electronically Signed   By: Prudencio Pair M.D.   On: 05/07/2020 16:58    Pending Labs Unresulted Labs (From admission, onward)          Start     Ordered   05/14/20 0500  Creatinine, serum  (enoxaparin (LOVENOX)    CrCl >/= 30 ml/min)  Weekly,   R     Comments: while on  enoxaparin therapy    05/07/20 1812   05/08/20 0500  Comprehensive metabolic panel  Tomorrow morning,   R        05/07/20 1812   05/08/20 0500  CBC  Tomorrow morning,   R        05/07/20 1812   05/07/20 1817  Hemoglobin A1c  Once,   STAT       Comments: To assess prior glycemic control    05/07/20 1816   05/07/20 1812  Magnesium  Once,   STAT        05/07/20 1812   05/07/20 1812  Phosphorus  Once,   STAT        05/07/20 1812   05/07/20 1812  TSH  Once,   STAT        05/07/20 1812   05/07/20 1801  CBC  (enoxaparin (LOVENOX)    CrCl >/= 30 ml/min)  Once,   STAT       Comments: Baseline for enoxaparin therapy IF NOT ALREADY DRAWN.  Notify MD if PLT < 100 K.    05/07/20 1812   05/07/20 1801  Creatinine, serum  (enoxaparin (LOVENOX)    CrCl >/= 30 ml/min)  Once,   STAT       Comments: Baseline for enoxaparin therapy IF NOT ALREADY DRAWN.    05/07/20 1812          Vitals/Pain Today's Vitals   05/08/20 1335 05/08/20 1400 05/08/20 1500 05/08/20 1600  BP: (!) 149/104 134/76 (!) 143/80 139/85  Pulse: 72  (!) 59 62  Resp: 15 (!) 22 20 16   Temp:      TempSrc:      SpO2: 100%  98% 97%  Weight:      Height:      PainSc:        Isolation Precautions No active isolations  Medications Medications  sodium chloride flush (NS) 0.9 % injection 3 mL (3 mLs Intravenous Not Given 05/07/20 1726)  enoxaparin (LOVENOX) injection 40 mg (40 mg Subcutaneous Given 05/07/20 2035)  acetaminophen (TYLENOL) tablet 650 mg (has no administration in time range)    Or  acetaminophen (TYLENOL) suppository 650 mg (has no administration in time range)  ondansetron (ZOFRAN) tablet 4 mg (has no administration in time range)    Or  ondansetron (ZOFRAN) injection  4 mg (has no administration in time range)  hydrALAZINE (APRESOLINE) injection 10 mg (has no administration in time range)  cefTRIAXone (ROCEPHIN) 1 g in sodium chloride 0.9 % 100 mL IVPB (has no administration in time range)  insulin aspart  (novoLOG) injection 0-15 Units (2 Units Subcutaneous Given 05/08/20 1356)  insulin aspart (novoLOG) injection 0-5 Units (0 Units Subcutaneous Not Given 05/07/20 2134)  aspirin EC tablet 81 mg (81 mg Oral Given 05/08/20 1148)  amLODipine (NORVASC) tablet 2.5 mg (2.5 mg Oral Given 05/08/20 1147)  carvedilol (COREG) tablet 25 mg (25 mg Oral Given 05/08/20 1147)  hydrALAZINE (APRESOLINE) tablet 10 mg (10 mg Oral Given 05/08/20 1411)  rosuvastatin (CRESTOR) tablet 10 mg (10 mg Oral Given 05/08/20 1146)  pantoprazole (PROTONIX) EC tablet 40 mg (40 mg Oral Given 05/08/20 1148)  tamsulosin (FLOMAX) capsule 0.4 mg (0.4 mg Oral Given 05/08/20 1147)  gabapentin (NEURONTIN) capsule 300 mg (300 mg Oral Given 05/08/20 1148)  cefTRIAXone (ROCEPHIN) 1 g in sodium chloride 0.9 % 100 mL IVPB ( Intravenous Stopped 05/07/20 1832)  sodium chloride 0.9 % bolus 1,000 mL (0 mLs Intravenous Stopped 05/07/20 2135)    Mobility walks with device High fall risk   Focused Assessments Neuro Assessment Handoff:  Swallow screen pass? Yes  Cardiac Rhythm: Normal sinus rhythm NIH Stroke Scale ( + Modified Stroke Scale Criteria)  Interval: Initial Level of Consciousness (1a.)   : Alert, keenly responsive LOC Questions (1b. )   +: Answers both questions correctly LOC Commands (1c. )   + : Performs both tasks correctly Best Gaze (2. )  +: Normal Visual (3. )  +: No visual loss Facial Palsy (4. )    : Normal symmetrical movements Motor Arm, Left (5a. )   +: No drift Motor Arm, Right (5b. )   +: No drift Motor Leg, Left (6a. )   +: Drift Motor Leg, Right (6b. )   +: Drift Limb Ataxia (7. ): Absent Sensory (8. )   +: Normal, no sensory loss Best Language (9. )   +: No aphasia Dysarthria (10. ): Normal Extinction/Inattention (11.)   +: No Abnormality Modified SS Total  +: 2 Complete NIHSS TOTAL: 1     Neuro Assessment: Exceptions to WDL Neuro Checks:   Initial (05/07/20 1520)  Last Documented NIHSS Modified Score: 2  (05/08/20 0800)     R Recommendations: See Admitting Provider Note  Report given to:   Additional Notes:

## 2020-05-09 DIAGNOSIS — G9340 Encephalopathy, unspecified: Secondary | ICD-10-CM

## 2020-05-09 LAB — BASIC METABOLIC PANEL
Anion gap: 11 (ref 5–15)
BUN: 11 mg/dL (ref 8–23)
CO2: 22 mmol/L (ref 22–32)
Calcium: 9 mg/dL (ref 8.9–10.3)
Chloride: 106 mmol/L (ref 98–111)
Creatinine, Ser: 1.32 mg/dL — ABNORMAL HIGH (ref 0.61–1.24)
GFR calc Af Amer: >60 mL/min (ref >60)
GFR calc non Af Amer: 52 mL/min — ABNORMAL LOW (ref >60)
Glucose, Bld: 227 mg/dL — ABNORMAL HIGH (ref 70–99)
Potassium: 3.7 mmol/L (ref 3.5–5.1)
Sodium: 139 mmol/L (ref 135–145)

## 2020-05-09 LAB — URINE CULTURE: Culture: >=100000 — AB

## 2020-05-09 LAB — GLUCOSE, CAPILLARY
Glucose-Capillary: 141 mg/dL — ABNORMAL HIGH (ref 70–99)
Glucose-Capillary: 110 mg/dL — ABNORMAL HIGH (ref 70–99)
Glucose-Capillary: 176 mg/dL — ABNORMAL HIGH (ref 70–99)
Glucose-Capillary: 92 mg/dL (ref 70–99)

## 2020-05-09 MED ORDER — CEPHALEXIN 500 MG PO CAPS: 500.0000 mg | ORAL_CAPSULE | Freq: Two times a day (BID) | ORAL | 0 refills | Status: AC

## 2020-05-09 NOTE — TOC Transition Note (Signed)
Transition of Care [facility]) - CM/SW Discharge Note   Patient Details  Name: [name] [name] MRN: [id number] Date of Birth: 24-Nov-1945  Transition of Care [facility]) CM/SW Contact:  Alexander Mt, LCSW Phone Number: 05/09/2020, 3:54 PM   Clinical Narrative:    Spoke with pt wife [name] [name] via telephone as I am on another unit and pt discharge orders have been placed. Pt rec for 3n1 (ordered through [employer]). Per previous notes pt had gotten home health services through [facility] and pt wife states they were amenable to referral being sent to them.   Referral given to [place] at [facility], pending review from their office. RN Prentiss Bells aware and will d.c pt once response received.    Final next level of care: [place] Barriers to Discharge: Barriers Resolved   Patient Goals and CMS Choice Patient states their goals for this hospitalization and ongoing recovery are:: to get stronger- be able to get up and down the stairs CMS Medicare.gov Compare Post Acute Care list provided to:: Patient Represenative (must comment) (pt spouse) Choice offered to / list presented to : Patient, Spouse  Discharge Placement Name of family member notified: pt wife via telephone Patient and family notified of of transfer: 05/09/20  Discharge Plan and Services In-house Referral: Clinical Social Work Discharge Planning Services: [employer] Consult Post Acute Care Choice: Durable Medical Equipment, Home Health          DME Arranged: 3-N-1 DME Agency: [employer] Date DME Agency Contacted: 05/09/20 Time DME Agency Contacted: [phone] Representative spoke with at DME Agency: Freda Munro HH Arranged: PT, OT [place] Agency: [facility] Date [place]: 05/09/20 Time [place]: [phone] Representative spoke with at [place]: [place]   Readmission Risk Interventions Readmission Risk Prevention Plan 05/09/2020  Transportation Screening Complete  PCP or Specialist Appt within 5-7 Days Complete  Home  Care Screening Complete  Medication Review (RN CM) Referral to Pharmacy  Some recent data might be hidden

## 2020-05-09 NOTE — TOC Initial Note (Signed)
Transition of Care [facility]) - Initial/Assessment Note    Patient Details  Name: [name] [name] MRN: [id number] Date of Birth: 04-03-1946  [facility]) CM/SW Contact:    Alexander Mt, LCSW Phone Number:  05/09/2020, 1:04 PM  Clinical Narrative:                 CSW spoke with pt and pt wife at bedside. Confirmed home address and PCP. Pt usually uses a rollator as needed to get around. Pt and pt wife aware of recommendations- pt wife able to be home 24/7 as needed. CSW explained different levels of care- pt and wife familiar with HH/OPPT/SNF. This Probation officer recommended pt work with OT and then we would f/u with her to discuss recommendations and preferences.   Expected Discharge Plan: [place] Barriers to Discharge: Continued Medical Work up   Patient Goals and CMS Choice Patient states their goals for this hospitalization and ongoing recovery are:: to get stronger- be able to get up and down the stairs CMS Medicare.gov Compare Post Acute Care list provided to:: Patient Choice offered to / list presented to : Patient, Spouse  Expected Discharge Plan and Services Expected Discharge Plan: [place] In-house Referral: Clinical Social Work Discharge Planning Services: CM Consult Post Acute Care Choice: Durable Medical Equipment, Home Health Living arrangements for the past 2 months: Single Family Home   Prior Living Arrangements/Services Living arrangements for the past 2 months: Single Family Home Lives with:: Self Patient language and need for interpreter reviewed:: Yes (no needs) Do you feel safe going back to the place where you live?: Yes      Need for Family Participation in Patient Care: Yes (Comment) Care giver support system in place?: Yes (comment) Current home services: DME Criminal Activity/Legal Involvement Pertinent to Current Situation/Hospitalization: No - Comment as needed  Activities of Daily Living Home Assistive  Devices/Equipment: Wheelchair, Environmental consultant (specify type), CBG Meter ADL Screening (condition at time of admission) Patient's cognitive ability adequate to safely complete daily activities?: Yes Is the patient deaf or have difficulty hearing?: No Does the patient have difficulty seeing, even when wearing glasses/contacts?: No Does the patient have difficulty concentrating, remembering, or making decisions?: No Patient able to express need for assistance with ADLs?: Yes Does the patient have difficulty dressing or bathing?: No Independently performs ADLs?: Yes (appropriate for developmental age) Does the patient have difficulty walking or climbing stairs?: No Weakness of Legs: None Weakness of Arms/Hands: None  Permission Sought/Granted Permission sought to share information with : Family Supports Permission granted to share information with : Yes, Verbal Permission Granted  Share Information with NAME: [place] granted to share info w AGENCY: SNF vs HH  Permission granted to share info w Relationship: wife     Emotional Assessment Appearance:: Appears stated age Attitude/Demeanor/Rapport: Gracious, Engaged Affect (typically observed): Accepting, Adaptable Orientation: : Oriented to Self, Oriented to Place, Oriented to  Time Alcohol / Substance Use: Not Applicable Psych Involvement: No (comment)  Admission diagnosis:  UTI (urinary tract infection) [N39.0] Encephalopathy [G93.40] Acute urinary tract infection [N39.0] AMS (altered mental status) [R41.82] Patient Active Problem List   Diagnosis Date Noted  . Hypertension   . Hyperlipidemia   . GERD (gastroesophageal reflux disease)   . DM (diabetes mellitus) ([place])   . CKD (chronic kidney disease), stage III   . Normocytic anemia   . Thrombocytopenia ([place])   . UTI (urinary tract infection)   .  AMS (altered mental status)   . 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 History of  repair of dissecting thoracic aneurysm   . Abdominal distension   . Pulmonary embolus ([place]) 10/19/2019  . CVA (cerebral vascular accident) ([place]) 10/19/2019  . AKI (acute kidney injury) ([place]) 10/19/2019  . 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 Ileus ([place]) 10/19/2019  . 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 Radiculopathy 12/15/2014   PCP:  Wenda Low, MD Pharmacy:   [id number] [place], [address] [place] [place] [address] Phone: [phone] Fax: [phone]   Readmission Risk Interventions Readmission Risk Prevention Plan 05/09/2020  Transportation Screening Complete  PCP or Specialist Appt within 5-7 Days Complete  Home Care Screening Complete  Medication Review (RN CM) Referral to Pharmacy  Some recent data might be hidden

## 2020-05-09 NOTE — Progress Notes (Signed)
Physical Therapy Treatment Patient Details Name: [name] [name] MRN: [id number] DOB: Sep 05, 1945 Today's Date: 05/09/2020    History of Present Illness 74 year old male with history of hypertension, hyperlipidemia, diabetes mellitus type 2, diabetic neuropathy, GERD, chronic back pain, CVA was brought to the ED with altered mental status. +UTI, CT head negative    PT Comments    The pt is making good progress with therapy goals, but continues to present with deficits in power, strength, stability, and safety awareness that limit his ability to safely d/c home as his wife works during the day leaving the pt home alone. He was able to ambulate 200 ft in the hall with use of a RW and supervision, but intermittently requires physical assist for transfers and stability making him at significant risk of falls without continued therapy and supervision following d/c. The pt states he does not think his family could provide the needed supervision, and is open to short stint rehab.   5X Sit-to-Stand:  48 sec (> 12.6 sec indicates increased risk of falls for individuals aged 39-79, > 15 sec indicates increased risk of recurrent falls)    Follow Up Recommendations  SNF;Supervision/Assistance - 24 hour (if family could provide 24/7, he could go home with HHPT)     Equipment Recommendations  None recommended by PT    Recommendations for Other Services       Precautions / Restrictions Precautions Precautions: Fall Restrictions Weight Bearing Restrictions: No    Mobility  Bed Mobility Overal bed mobility: Needs Assistance Bed Mobility: Supine to Sit;Sit to Supine     Supine to sit: Supervision;HOB elevated Sit to supine: Supervision   General bed mobility comments: increased time but no assist given  Transfers Overall transfer level: Needs assistance Equipment used: Rolling walker (2 wheeled) Transfers: Sit to/from Stand Sit to Stand: Min assist         General transfer comment:  attempted without RW initially and pt unable to rise to standing, minA with use of RW for complete stand. cues for hip ext. x5STS with intermittent use of UE, 48 sec  Ambulation/Gait Ambulation/Gait assistance: Min guard Gait Distance (Feet): 200 Feet Assistive device: Rolling walker (2 wheeled) Gait Pattern/deviations: Decreased stride length;Shuffle;Step-through pattern;Trunk flexed Gait velocity: 0.4 m/s Gait velocity interpretation: <1.8 ft/sec, indicate of risk for recurrent falls General Gait Details: slow but steady gait, no reports of dizziness, able to maintain slightly improved posture with cues.     Balance Overall balance assessment: Needs assistance Sitting-balance support: No upper extremity supported;Feet supported Sitting balance-Leahy Scale: Fair     Standing balance support: Bilateral upper extremity supported Standing balance-Leahy Scale: Poor Standing balance comment: reliant on BUE support                            Cognition Arousal/Alertness: Awake/alert Behavior During Therapy: Flat affect Overall Cognitive Status: Impaired/Different from baseline Area of Impairment: Memory;Problem solving                 Orientation Level: Disoriented to;Situation   Memory: Decreased short-term memory       Problem Solving: Slow processing;Decreased initiation;Requires tactile cues;Requires verbal cues General Comments: pt able to answer many home situation questions as well as career history during session, but was unable to verbalize current hospital situation untill told to him by PT. stating "I wish my wife was here, she helps me remember things" multiple times through session      Exercises  General Comments General comments (skin integrity, edema, etc.): VSS through session on RA      Pertinent Vitals/Pain Pain Assessment: No/denies pain Pain Intervention(s): Monitored during session           PT Goals (current goals can now be  found in the care plan section) Acute Rehab PT Goals Patient Stated Goal: get better PT Goal Formulation: With patient Time For Goal Achievement: 05/22/20 Potential to Achieve Goals: Good Progress towards PT goals: Progressing toward goals    Frequency    Min 3X/week      PT Plan Current plan remains appropriate       AM-PAC PT "6 Clicks" Mobility   Outcome Measure  Help needed turning from your back to your side while in a flat bed without using bedrails?: None Help needed moving from lying on your back to sitting on the side of a flat bed without using bedrails?: A Little Help needed moving to and from a bed to a chair (including a wheelchair)?: A Little Help needed standing up from a chair using your arms (e.g., wheelchair or bedside chair)?: A Little Help needed to walk in hospital room?: A Little Help needed climbing 3-5 steps with a railing? : A Little 6 Click Score: 19    End of Session Equipment Utilized During Treatment: Gait belt Activity Tolerance: Patient tolerated treatment well Patient left: in bed;with call bell/phone within reach;with bed alarm set Nurse Communication: Mobility status PT Visit Diagnosis: Difficulty in walking, not elsewhere classified (R26.2)     Time: 7680-8811 (minus 10 min while pt using bathroom) PT Time Calculation (min) (ACUTE ONLY): 44 min  Charges:  $Gait Training: 23-37 mins                     Karma Ganja, PT, DPT   Acute Rehabilitation Department Pager #: [phone]   Otho Bellows 05/09/2020, 9:54 AM

## 2020-05-09 NOTE — TOC Progression Note (Signed)
Transition of Care [facility]) - Progression Note    Patient Details  Name: [name] [name] MRN: [id number] Date of Birth: Jul 16, 1946  [facility]) CM/SW Contact  Jacalyn Lefevre Edson Snowball, RN Phone Number: 05/09/2020, 4:13 PM  Clinical Narrative:     [facility] to [facility] referral for HHPT/OT. Patient and wife aware. No preference , called Tommi Rumps with [facility], he accepted. Patient and wife aware.   Patient already has 3 in1  At home.   Expected Discharge Plan: [place] Barriers to Discharge: Barriers Resolved  Expected Discharge Plan and Services Expected Discharge Plan: [place] In-house Referral: Clinical Social Work Discharge Planning Services: CM Consult Post Acute Care Choice: Durable Medical Equipment, Home Health Living arrangements for the past 2 months: Single Family Home Expected Discharge Date: 05/09/20               DME Arranged: 3-N-1 DME Agency: [employer] Date DME Agency Contacted: 05/09/20 Time DME Agency Contacted: [phone] Representative spoke with at DME Agency: Freda Munro HH Arranged: PT, OT [place] Agency: [facility] Date [place]: 05/09/20 Time [place]: 0017 Representative spoke with at [place]: [place] ([place]) Interventions    Readmission Risk Interventions Readmission Risk Prevention Plan 05/09/2020  Transportation Screening Complete  PCP or Specialist Appt within 5-7 Days Complete  Home Care Screening Complete  Medication Review (RN CM) Referral to Pharmacy  Some recent data might be hidden

## 2020-05-09 NOTE — Discharge Summary (Addendum)
Physician Discharge Summary  [name] [name] XNA:[id number] DOB: [birth year] DOA: 05/07/2020  PCP: Wenda Low, MD  Admit date: 05/07/2020 Discharge date: 05/09/2020  Time spent: 50 minutes  Recommendations for Outpatient Follow-up:  1. Follow-up PCP in 2 weeks 2. Patient to go home with home health PT OT. 3. Will discharge on Keflex 500 mg p.o. twice daily for 5 more days.   Discharge Diagnoses:  Principal Problem:   AMS (altered mental status) Active Problems:   CVA (cerebral vascular accident) ([place])   Hypertension   Hyperlipidemia   GERD (gastroesophageal reflux disease)   DM (diabetes mellitus) ([place])   CKD (chronic kidney disease), stage III   Normocytic anemia   Thrombocytopenia (H[facility])   UTI (urinary tract infection)   Discharge Condition: Stable  Diet recommendation: Heart healthy diet  Filed Weights   05/07/20 1535 05/09/20 0441  Weight: 90 kg 85.5 kg    History of present illness:  74 year old male with history of hypertension, hyperlipidemia, diabetes mellitus type 2, GERD, chronic back pain, CVA was brought to the ED with altered mental status.  Patient found to have abnormal UA started on IV ceftriaxone for UTI.  Urine culture obtained.   Hospital Course:  1. Altered mental status-resolved, likely from underlying UTI, UA shows positive nitrate.  Patient started on IV Rocephin.  WBC was 13.2.  Urine culture growing E. coli, sensitive to cefazolin.  Will discharge on Keflex 500 mg p.o. twice daily for 5 more days. PT OT consulted, recommended skilled nursing facility versus home health OT/PT.  Discussed with patient's wife she wants to take patient home.  Will order home health PT OT. 2. Hypertension-blood pressure stable, continue home medication including Coreg, amlodipine, hydralazine. 3. Hyperlipidemia-continue statin 4. GERD-continue Protonix 5. BPH-continue Flomax 6. Diabetes mellitus type 2-continue home regimen . 7. History of CVA-continue aspirin,  statin 8. Diabetic neuropathy-continue gabapentin  9. Hypokalemia-replete.  Potassium is 3.7 today.  Procedures:    Consultations:    Discharge Exam: Vitals:   05/09/20 0832 05/09/20 1204  BP: (!) 165/98 124/73  Pulse:  60  Resp:  16  Temp:  97.6 F (36.4 C)  SpO2:  97%    General: Appears in no acute distress Cardiovascular: S1-S2, regular Respiratory: Clear to auscultation bilaterally  Discharge Instructions   Discharge Instructions    Diet - low sodium heart healthy   Complete by: As directed    Increase activity slowly   Complete by: As directed      Allergies as of 05/09/2020      Reactions   Lyrica [pregabalin] Nausea Only, Other (See Comments)   Hallucinations and dizziness, also   Sulfa Antibiotics Other (See Comments)   Unknown reaction      Medication List    TAKE these medications   acetaminophen 500 MG tablet Commonly known as: TYLENOL Take 1,000 mg by mouth every 6 (six) hours as needed for headache (pain).   amLODipine 2.5 MG tablet Commonly known as: NORVASC Take 2.5 mg by mouth daily.   aspirin EC 81 MG tablet Take 81 mg by mouth daily.   CALCIUM-D PO Take 1 tablet by mouth daily.   carvedilol 25 MG tablet Commonly known as: COREG Take 1 tablet (25 mg total) by mouth 2 (two) times daily with a meal.   cephALEXin 500 MG capsule Commonly known as: KEFLEX Take 1 capsule (500 mg total) by mouth 2 (two) times daily for 5 days.   gabapentin 300 MG capsule Commonly known as: NEURONTIN Take  300 mg by mouth 2 (two) times daily.   hydrALAZINE 10 MG tablet Commonly known as: APRESOLINE Take 1 tablet (10 mg total) by mouth every 6 (six) hours.   magnesium citrate Soln Take 1 Bottle by mouth daily as needed for severe constipation.   metFORMIN 500 MG tablet Commonly known as: GLUCOPHAGE Take 500 mg by mouth 2 (two) times daily.   multivitamin with minerals Tabs tablet Take 1 tablet by mouth daily.   pantoprazole 40 MG  tablet Commonly known as: PROTONIX Take 1 tablet (40 mg total) by mouth daily.   rosuvastatin 10 MG tablet Commonly known as: CRESTOR Take 1 tablet (10 mg total) by mouth daily.   tamsulosin 0.4 MG Caps capsule Commonly known as: FLOMAX Take 1 capsule (0.4 mg total) by mouth daily.   VITAMIN B-12 PO Take 1 tablet by mouth daily.   Vitamin C 500 MG Caps Take 500 mg by mouth daily.            Durable Medical Equipment  (From admission, onward)         Start     Ordered   05/09/20 1347  For home use only DME 3 n 1  Once        05/09/20 1346         Allergies  Allergen Reactions  . Lyrica [Pregabalin] Nausea Only and Other (See Comments)    Hallucinations and dizziness, also  . Sulfa Antibiotics Other (See Comments)    Unknown reaction      The results of significant diagnostics from this hospitalization (including imaging, microbiology, ancillary and laboratory) are listed below for reference.    Significant Diagnostic Studies: DG Chest 2 View  Result Date: 05/07/2020 CLINICAL DATA:  Acute mental status change. EXAM: CHEST - 2 VIEW COMPARISON:  October 25, 2019 FINDINGS: The aortic stent is stable. The cardiomediastinal silhouette is unchanged. Left retrocardiac opacity is improved but persists. No pneumothorax. The remainder of the lungs are clear. No other acute abnormalities. IMPRESSION: No acute interval changes. Persistent but improved left retrocardiac opacity, possibly atelectasis. Electronically Signed   By: Dorise Bullion III M.D   On: 05/07/2020 16:22   CT HEAD WO CONTRAST  Result Date: 05/07/2020 CLINICAL DATA:  Altered mental status generalized weakness EXAM: CT HEAD WITHOUT CONTRAST TECHNIQUE: Contiguous axial images were obtained from the base of the skull through the vertex without intravenous contrast. COMPARISON:  None. FINDINGS: Brain: No evidence of acute territorial infarction, hemorrhage, hydrocephalus,extra-axial collection or mass lesion/mass  effect. There is dilatation the ventricles and sulci consistent with age-related atrophy. Low-attenuation changes in the deep white matter consistent with small vessel ischemia. Vascular: No hyperdense vessel or unexpected calcification. Skull: The skull is intact. No fracture or focal lesion identified. Sinuses/Orbits: The visualized paranasal sinuses and mastoid air cells are clear. The orbits and globes intact. Other: None IMPRESSION: No acute intracranial abnormality. Findings consistent with age related atrophy and chronic small vessel ischemia Electronically Signed   By: Prudencio Pair M.D.   On: 05/07/2020 16:58    Microbiology: Recent Results (from the past 240 hour(s))  SARS Coronavirus 2 by RT PCR (hospital order, performed in [facility] hospital lab) Nasopharyngeal Nasopharyngeal Swab     Status: None   Collection Time: 05/07/20  4:11 PM   Specimen: Nasopharyngeal Swab  Result Value Ref Range Status   SARS Coronavirus 2 NEGATIVE NEGATIVE Final    Comment: (NOTE) SARS-CoV-2 target nucleic acids are NOT DETECTED.  The SARS-CoV-2 RNA is  generally detectable in upper and lower respiratory specimens during the acute phase of infection. The lowest concentration of SARS-CoV-2 viral copies this assay can detect is 250 copies / mL. A negative result does not preclude SARS-CoV-2 infection and should not be used as the sole basis for treatment or other patient management decisions.  A negative result may occur with improper specimen collection / handling, submission of specimen other than nasopharyngeal swab, presence of viral mutation(s) within the areas targeted by this assay, and inadequate number of viral copies (<250 copies / mL). A negative result must be combined with clinical observations, patient history, and epidemiological information.  Fact Sheet for Patients:   StrictlyIdeas.no  Fact Sheet for Healthcare  Providers: BankingDealers.co.za  This test is not yet approved or  cleared by the [place] FDA and has been authorized for detection and/or diagnosis of SARS-CoV-2 by FDA under an Emergency Use Authorization (EUA).  This EUA will remain in effect (meaning this test can be used) for the duration of the COVID-19 declaration under Section 564(b)(1) of the Act, 21 U.S.C. section 360bbb-3(b)(1), unless the authorization is terminated or revoked sooner.  Performed at [facility], [place] [address], [place], [place] [address]   Urine culture     Status: Abnormal   Collection Time: 05/07/20  5:06 PM   Specimen: Urine, Random  Result Value Ref Range Status   Specimen Description URINE, RANDOM  Final   Special Requests   Final    NONE Performed at [facility], [place] [address] [place], [place] [address]    Culture >=100,000 COLONIES/mL ESCHERICHIA COLI (A)  Final   Report Status 05/09/2020 FINAL  Final   Organism ID, Bacteria ESCHERICHIA COLI (A)  Final      Susceptibility   Escherichia coli - MIC*    AMPICILLIN <=2 SENSITIVE Sensitive     CEFAZOLIN <=4 SENSITIVE Sensitive     CEFTRIAXONE <=0.25 SENSITIVE Sensitive     CIPROFLOXACIN <=0.25 SENSITIVE Sensitive     GENTAMICIN <=1 SENSITIVE Sensitive     IMIPENEM <=0.25 SENSITIVE Sensitive     NITROFURANTOIN <=16 SENSITIVE Sensitive     TRIMETH/SULFA <=20 SENSITIVE Sensitive     AMPICILLIN/SULBACTAM <=2 SENSITIVE Sensitive     PIP/TAZO <=4 SENSITIVE Sensitive     * >=100,000 COLONIES/mL ESCHERICHIA COLI     Labs: Basic Metabolic Panel: Recent Labs  Lab 05/07/20 1531 05/07/20 1726 05/08/20 1815  NA 141 142 139  K 3.8 3.7 3.3*  CL 106 105 105  CO2 24  --  23  GLUCOSE 128* 119* 139*  BUN 12 14 12   CREATININE 1.36* 1.30* 1.27*  CALCIUM 9.3  --  9.3   Liver Function Tests: Recent Labs  Lab 05/07/20 1531 05/08/20 1815  AST 17 15  ALT 15 12  ALKPHOS 63 68  BILITOT 1.3* 1.1  PROT 6.7  6.5  ALBUMIN 3.8 3.4*   No results for input(s): LIPASE, AMYLASE in the last 168 hours. No results for input(s): AMMONIA in the last 168 hours. CBC: Recent Labs  Lab 05/07/20 1531 05/07/20 1726 05/08/20 1815  WBC 13.2*  --  10.3  NEUTROABS 10.9*  --   --   HGB 11.2* 11.9* 11.8*  HCT 36.8* 35.0* 37.7*  MCV 90.6  --  87.9  PLT 120*  --  124*    CBG: Recent Labs  Lab 05/08/20 1153 05/08/20 1724 05/08/20 2123 05/09/20 0630 05/09/20 1110  GLUCAP 124* 152* 141* 110* 176*  Signed:  Oswald Hillock MD.  [facility] 05/09/2020, 1:52 PM

## 2020-05-09 NOTE — Evaluation (Signed)
Occupational Therapy Evaluation Patient Details Name: [name] [name] MRN: [id number] DOB: 12-10-1945 Today's Date: 05/09/2020    History of Present Illness 74 year old male with history of hypertension, hyperlipidemia, diabetes mellitus type 2, diabetic neuropathy, GERD, chronic back pain, CVA was brought to the ED with altered mental status. +UTI, CT head negative   Clinical Impression   PTA patient reports independent with ADLs, mobility, driving (short distances).  Admitted for above and limited by problem list below, including generalized weakness, impaired balance, decreased activity tolerance and impaired cognition. Patient alert and following commands, noted deficits in processing, recall and problem solving, but improved awareness to situation.  Requires minA-min guard for transfers/mobility using RW, up to min assist for ADLS.  Believe patient will benefit from continued OT services while admitted and after dc at [facility] level, given 24/7 support at dc.  IF family cannot provide 24/7 support, recommend SNF for short term rehab in order to optimize independence and safety with ADLs/ mobility.    Follow Up Recommendations  Home health OT;Supervision/Assistance - 24 hour (SNF if doesn't have 24/7 support)    Equipment Recommendations  3 in 1 bedside commode    Recommendations for Other Services       Precautions / Restrictions Precautions Precautions: Fall Restrictions Weight Bearing Restrictions: No      Mobility Bed Mobility Overal bed mobility: Needs Assistance Bed Mobility: Supine to Sit;Sit to Supine     Supine to sit: Supervision;HOB elevated Sit to supine: Supervision   General bed mobility comments: increased time but no physical assist required  Transfers Overall transfer level: Needs assistance Equipment used: Rolling walker (2 wheeled) Transfers: Sit to/from Stand Sit to Stand: Min assist         General transfer comment: min assist to power up and  steady, cueing for hand placement     Balance Overall balance assessment: Needs assistance Sitting-balance support: No upper extremity supported;Feet supported Sitting balance-Leahy Scale: Fair     Standing balance support: No upper extremity supported;Bilateral upper extremity supported;During functional activity Standing balance-Leahy Scale: Poor Standing balance comment: relies on UE support, min guard to min assist grooming at sink with 0 hand support with mild posterior lean                            ADL either performed or assessed with clinical judgement   ADL Overall ADL's : Needs assistance/impaired     Grooming: Minimal assistance;Wash/dry hands;Oral care;Standing   Upper Body Bathing: Sitting;Supervision/ safety   Lower Body Bathing: Minimal assistance;Sit to/from stand   Upper Body Dressing : Supervision/safety;Sitting   Lower Body Dressing: Minimal assistance;Sit to/from stand   Toilet Transfer: Minimal assistance;Ambulation;RW;Grab bars   Toileting- Clothing Manipulation and Hygiene: Minimal assistance;Sit to/from stand       Functional mobility during ADLs: Minimal assistance;Rolling walker General ADL Comments: pt limited by decreased activity tolerance, generalized weakness, impaired balance and cognition      Vision   Vision Assessment?: No apparent visual deficits     Perception     Praxis      Pertinent Vitals/Pain Pain Assessment: No/denies pain Pain Intervention(s): Monitored during session     Hand Dominance Right   Extremity/Trunk Assessment Upper Extremity Assessment Upper Extremity Assessment: Generalized weakness   Lower Extremity Assessment Lower Extremity Assessment: Defer to PT evaluation   Cervical / Trunk Assessment Cervical / Trunk Assessment: Normal   Communication Communication Communication: No difficulties   Cognition Arousal/Alertness:  Awake/alert Behavior During Therapy: Flat affect Overall  Cognitive Status: Impaired/Different from baseline Area of Impairment: Memory;Problem solving                 Orientation Level: Disoriented to;Situation   Memory: Decreased short-term memory       Problem Solving: Slow processing;Decreased initiation;Difficulty sequencing;Requires verbal cues General Comments: patient requires increased time for processing and problem sovling, decreased STM but functional    General Comments  spouse present and supportive    Exercises     Shoulder Instructions      Home Living Family/patient expects to be discharged to:: Private residence Living Arrangements: Spouse/significant other Available Help at Discharge: Family;Available PRN/intermittently Type of Home: House Home Access: Stairs to enter [employer] of Steps: 1 Entrance Stairs-Rails: None Home Layout: Multi-level Alternate Level Stairs-Number of Steps: 7 x2 separated by landing Alternate Level Stairs-Rails: Right Bathroom Shower/Tub: Occupational psychologist: Standard     Home Equipment: Environmental consultant - 2 wheels;Transport chair;Walker - 4 wheels;Shower seat;Hand held shower head;Bedside commode   Additional Comments: can stay on the 1st floor, spouse present and reports can provide 24/7 as needed      Prior Functioning/Environment Level of Independence: Independent  Gait / Transfers Assistance Needed: reports independent mobility recently  ADL's / Homemaking Assistance Needed: reports completing all ADLs, IADLs without assist; driving short distances            OT Problem List: Decreased strength;Decreased activity tolerance;Impaired balance (sitting and/or standing);Decreased cognition;Decreased safety awareness;Decreased knowledge of use of DME or AE;Decreased knowledge of precautions      OT Treatment/Interventions: Self-care/ADL training;Energy conservation;DME and/or AE instruction;Therapeutic activities;Patient/family education;Balance  training;Cognitive remediation/compensation    OT Goals(Current goals can be found in the care plan section) Acute Rehab OT Goals Patient Stated Goal: get better and get home  OT Goal Formulation: With patient Time For Goal Achievement: 05/23/20 Potential to Achieve Goals: Good  OT Frequency: Min 2X/week   Barriers to D/C:            Co-evaluation              AM-PAC OT "6 Clicks" Daily Activity     Outcome Measure Help from another [place] eating meals?: A Little Help from another [place] taking care of personal grooming?: A Little Help from another [place] toileting, which includes using toliet, bedpan, or urinal?: A Little Help from another Gandolfi bathing (including washing, rinsing, drying)?: A Little Help from another Jent to put on and taking off regular upper body clothing?: A Little Help from another [place] to put on and taking off regular lower body clothing?: A Little 6 Click Score: 18   End of Session Equipment Utilized During Treatment: Gait belt;Rolling walker Nurse Communication: Mobility status  Activity Tolerance: Patient tolerated treatment well Patient left: in bed;with call bell/phone within reach;with bed alarm set;with family/visitor present  OT Visit Diagnosis: Other abnormalities of gait and mobility (R26.89);Muscle weakness (generalized) (M62.81);Other symptoms and signs involving cognitive function                Time: 0160-1093 OT Time Calculation (min): 26 min Charges:  OT General Charges $OT Visit: 1 Visit OT Evaluation $OT Eval Moderate Complexity: 1 Mod OT Treatments $Self Care/Home Management : 8-22 mins  [name] [name], OT Acute Rehabilitation Services Pager [phone] Office [phone]   [name] [name] 05/09/2020, 12:52 PM

## 2020-05-12 ENCOUNTER — Encounter: Payer: Medicare Other | Attending: Registered Nurse | Admitting: Physical Medicine & Rehabilitation

## 2020-05-12 ENCOUNTER — Encounter: Payer: Self-pay | Admitting: Physical Medicine & Rehabilitation

## 2020-05-12 ENCOUNTER — Other Ambulatory Visit: Payer: Self-pay

## 2020-05-12 VITALS — BP 152/81 | HR 54 | Temp 98.2°F | Ht 71.0 in | Wt 198.4 lb

## 2020-05-12 DIAGNOSIS — Z9889 Other specified postprocedural states: Secondary | ICD-10-CM | POA: Diagnosis present

## 2020-05-12 DIAGNOSIS — I10 Essential (primary) hypertension: Secondary | ICD-10-CM | POA: Diagnosis present

## 2020-05-12 DIAGNOSIS — I69319 Unspecified symptoms and signs involving cognitive functions following cerebral infarction: Secondary | ICD-10-CM | POA: Diagnosis not present

## 2020-05-12 DIAGNOSIS — R269 Unspecified abnormalities of gait and mobility: Secondary | ICD-10-CM

## 2020-05-12 DIAGNOSIS — I7103 Dissection of thoracoabdominal aorta: Secondary | ICD-10-CM | POA: Diagnosis present

## 2020-05-12 DIAGNOSIS — Z8673 Personal history of transient ischemic attack (TIA), and cerebral infarction without residual deficits: Secondary | ICD-10-CM | POA: Diagnosis not present

## 2020-05-12 DIAGNOSIS — E7849 Other hyperlipidemia: Secondary | ICD-10-CM | POA: Insufficient documentation

## 2020-05-12 DIAGNOSIS — I69398 Other sequelae of cerebral infarction: Secondary | ICD-10-CM | POA: Diagnosis not present

## 2020-05-12 DIAGNOSIS — Z8679 Personal history of other diseases of the circulatory system: Secondary | ICD-10-CM | POA: Diagnosis present

## 2020-05-12 NOTE — Progress Notes (Signed)
Subjective:    Patient ID: [name] [name], male    DOB: 1946/04/28, 73 y.o.   MRN: [id number] 73 y.o. right-handed male with history of chronic back pain with lumbar discectomy 2014 as well as anterior lateral lumbar fusion 2016, hypertension, diabetes mellitus, hyperlipidemia and remote tobacco abuse.  Per chart review lives with spouse independent prior to admission.  Presented 10/08/2019 with lower chest and back pain with epigastric discomfort times several weeks as well as bouts of nausea and vomiting.  In the ED blood pressure 180s/130s.  EKG showed nonspecific ST-T changes not significantly different from prior EKG 2016.  Troponin high-sensitivity 28, potassium 3.3, creatinine 1.25, hemoglobin 14.5, SARS coronavirus negative.  Chest x-ray no active disease.  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  Small pulmonary embolus involving the central right lobe pulmonary artery as well as probable additional small subsegmental embolus in the right lower lobe.  Echocardiogram with ejection fraction 65%.  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 Donzetta Matters.  Placed on intravenous heparin therapy.  Neurology consulted 10/17/2019 for altered mental status.  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  Carotid Dopplers with no ICA stenosis.  Neurology follow-up heparin was transitioned to Eliquis.  Hospital course blood loss anemia 9.3 follow-up GI services there was some fear of possible GI bleed related to intravenous heparin as well as transition to Eliquis no overt bleeding noted hemoglobin hematocrit remained stable no further work-up indicated Discharge from inpt stroke rehab program 11/13/2019 HPI Pt has completed outpt rehab program but had decline in cognition and function, his wife  took him to ED where a CT head showed no new infarct, UA demonstrated UTI  Pt is slower with mobility , balance a little off but no falls  Pt asking about driving but as discussed with pt and wife , this setback delays any discussion about return to driving  Pain Inventory Average Pain 4 Pain Right Now 4 My pain is sharp  In the last 24 hours, has pain interfered with the following? General activity 4 Relation with others 4 Enjoyment of life 4 What TIME of day is your pain at its worst? daytime Sleep (in general) Good  Pain is worse with: bending Pain improves with: medication Relief from Meds: 8  No family history on file. Social History   Socioeconomic History  . Marital status: Married    Spouse name: Not on file  . Number of children: Not on file  . Years of education: Not on file  . Highest education level: Not on file  Occupational History  . Not on file  Tobacco Use  . Smoking status: Former Smoker    Packs/day: 0.25    Years: 40.00    Pack years: 10.00    Types: Cigarettes  . Smokeless tobacco: Never Used  . Tobacco comment: none since 06/24/13  Vaping Use  . Vaping Use: Never used  Substance and Sexual Activity  . Alcohol use: No  . Drug use: No  . Sexual activity: Not on file  Other Topics Concern  . Not on file  Social History Narrative  . Not on file   Social Determinants of Health   Financial Resource Strain:   . Difficulty of Paying Living Expenses: Not on file  Food Insecurity:   . Worried About Running  Out of Food in the Last Year: Not on file  . Ran Out of Food in the Last Year: Not on file  Transportation Needs:   . Lack of Transportation (Medical): Not on file  . Lack of Transportation (Non-Medical): Not on file  Physical Activity:   . Days of Exercise per Week: Not on file  . Minutes of Exercise per Session: Not on file  Stress:   . Feeling of Stress : Not on file  Social Connections:   . Frequency of Communication with Friends and  Family: Not on file  . Frequency of Social Gatherings with Friends and Family: Not on file  . Attends Religious Services: Not on file  . Active Member of Clubs or Organizations: Not on file  . Attends Archivist Meetings: Not on file  . Marital Status: Not on file   Past Surgical History:  Procedure Laterality Date  . ANTERIOR LAT LUMBAR FUSION Left 12/15/2014   Procedure: ANTERIOR LATERAL LUMBAR FUSION 1 LEVEL;  Surgeon: Phylliss Bob, MD;  Location: [place];  Service: Orthopedics;  Laterality: Left;  Left sided lateral lumbar interbody fusion, lumbar 3-4, posterior spinal fusion, lumbar 3-4 with instrumentation.  . COLONOSCOPY    . ESOPHAGOGASTRODUODENOSCOPY    . fatty tissue removed from stomach    . LUMBAR LAMINECTOMY/DECOMPRESSION MICRODISCECTOMY N/A 07/08/2013   Procedure: LUMBAR LAMINECTOMY/DECOMPRESSION MICRODISCECTOMY;  Surgeon: Sinclair Ship, MD;  Location: [place];  Service: Orthopedics;  Laterality: N/A;  Lumbar 3-4, lumbar 4-5 decompression  . RADIOLOGY WITH ANESTHESIA N/A 10/22/2019   Procedure: MRI WITH ANESTHESIA OF LUMBAR SPINE WITHOUT CONTRAST;  Surgeon: Radiologist, Medication, MD;  Location: [place];  Service: Radiology;  Laterality: N/A;  . right ankle surgery     as child   . THORACIC AORTIC ENDOVASCULAR STENT GRAFT N/A 10/15/2019   Procedure: THORACIC AORTIC ENDOVASCULAR STENT GRAFT;  Surgeon: Waynetta Sandy, MD;  Location: [place];  Service: Vascular;  Laterality: N/A;  . TONSILLECTOMY     as a child   Past Surgical History:  Procedure Laterality Date  . ANTERIOR LAT LUMBAR FUSION Left 12/15/2014   Procedure: ANTERIOR LATERAL LUMBAR FUSION 1 LEVEL;  Surgeon: Phylliss Bob, MD;  Location: [place];  Service: Orthopedics;  Laterality: Left;  Left sided lateral lumbar interbody fusion, lumbar 3-4, posterior spinal fusion, lumbar 3-4 with instrumentation.  . COLONOSCOPY    . ESOPHAGOGASTRODUODENOSCOPY    . fatty tissue removed from stomach    . LUMBAR  LAMINECTOMY/DECOMPRESSION MICRODISCECTOMY N/A 07/08/2013   Procedure: LUMBAR LAMINECTOMY/DECOMPRESSION MICRODISCECTOMY;  Surgeon: Sinclair Ship, MD;  Location: [place];  Service: Orthopedics;  Laterality: N/A;  Lumbar 3-4, lumbar 4-5 decompression  . RADIOLOGY WITH ANESTHESIA N/A 10/22/2019   Procedure: MRI WITH ANESTHESIA OF LUMBAR SPINE WITHOUT CONTRAST;  Surgeon: Radiologist, Medication, MD;  Location: [place];  Service: Radiology;  Laterality: N/A;  . right ankle surgery     as child   . THORACIC AORTIC ENDOVASCULAR STENT GRAFT N/A 10/15/2019   Procedure: THORACIC AORTIC ENDOVASCULAR STENT GRAFT;  Surgeon: Waynetta Sandy, MD;  Location: [place];  Service: Vascular;  Laterality: N/A;  . TONSILLECTOMY     as a child   Past Medical History:  Diagnosis Date  . Chronic back pain    stenosis  . Enlarged prostate   . GERD (gastroesophageal reflux disease)    occasionally will take a zantac(maybe once a month)  . History of colon polyps   . History of kidney stones   .  History of stress test    done 10 yrs. ago, as a baseline   . Hyperlipidemia    takes Crestor daily  . Hypertension    takes Amlodipine daily and Lotensin as well  . Stroke ([place])    BP (!) 152/81   Pulse (!) 54   Temp 98.2 F (36.8 C)   Ht 5\' 11"  (1.803 m)   Wt 198 lb 6.4 oz (90 kg)   SpO2 96%   BMI 27.67 kg/m   Opioid Risk Score:   Fall Risk Score:  `1  Depression screen PHQ 2/9  Depression screen PHQ 2/9 11/24/2019  Decreased Interest 2  Down, Depressed, Hopeless 1  PHQ - 2 Score 3  Altered sleeping 0  Tired, decreased energy 1  Change in appetite 0  Feeling bad or failure about yourself  1  Trouble concentrating 2  Moving slowly or fidgety/restless 1  Suicidal thoughts 0  PHQ-9 Score 8    Review of Systems  Musculoskeletal: Positive for back pain and gait problem.  Neurological: Positive for weakness.  All other systems reviewed and are negative.      Objective:   Physical  Exam HENT:     Head: Normocephalic and atraumatic.  Eyes:     General: No visual field deficit.    Extraocular Movements: Extraocular movements intact.     Conjunctiva/sclera: Conjunctivae normal.     Pupils: Pupils are equal, round, and reactive to light.  Neurological:     Mental Status: He is alert and oriented to [place], place, and time.     Cranial Nerves: No dysarthria or facial asymmetry.     Motor: No abnormal muscle tone.     Coordination: Coordination is intact. Finger-Nose-Finger Test and Heel to [facility] Test normal. Rapid alternating movements normal.     Gait: Gait abnormal.     Comments: Serial 7 to 93, unchange Remembers 1/3 unrelated objects after 2 min delay (2/3 last visit)  Difficulty standing with feet together and eyes open, did not attempt romberg  Motor 5/5 in B Delt Bi tri grip, HF, KE, ADF  Amb with walker , no toe drag or knee instability    Psychiatric:        Mood and Affect: Mood normal.        Behavior: Behavior normal.           Assessment & Plan:  1.  Bilateral cerebral and cerebellar infarct , setback in recovery due to recent UTI, back in Home health, anticipate return to pre UTI baseline.   Would not recommend driving at this time  Pt to f/u in 6-8 wks see if OP therapies needed again  Has neuro f/u in Nov, asked for Neuro input regarding driving

## 2020-06-15 ENCOUNTER — Encounter: Payer: Self-pay | Admitting: Podiatry

## 2020-06-15 ENCOUNTER — Ambulatory Visit: Payer: Medicare Other | Admitting: Podiatry

## 2020-06-15 ENCOUNTER — Other Ambulatory Visit: Payer: Self-pay

## 2020-06-15 DIAGNOSIS — N183 Chronic kidney disease, stage 3 unspecified: Secondary | ICD-10-CM

## 2020-06-15 DIAGNOSIS — M79674 Pain in right toe(s): Secondary | ICD-10-CM

## 2020-06-15 DIAGNOSIS — E1140 Type 2 diabetes mellitus with diabetic neuropathy, unspecified: Secondary | ICD-10-CM | POA: Diagnosis not present

## 2020-06-15 DIAGNOSIS — M79675 Pain in left toe(s): Secondary | ICD-10-CM | POA: Diagnosis not present

## 2020-06-15 DIAGNOSIS — B351 Tinea unguium: Secondary | ICD-10-CM | POA: Diagnosis not present

## 2020-06-15 NOTE — Progress Notes (Signed)
This patient returns to my office for at risk foot care.  This patient requires this care by a professional since this patient will be at risk due to having acute kidney disease ,DM and CKD. He presents to the office with his wife.  This patient is unable to cut nails himself since the patient cannot reach his nails.These nails are painful walking and wearing shoes.  This patient presents for at risk foot care today.  General Appearance  Alert, conversant and in no acute stress.  Vascular  Dorsalis pedis and posterior tibial  pulses are palpable  bilaterally.  Capillary return is within normal limits  bilaterally. Temperature is within normal limits  bilaterally.  Neurologic  Senn-Weinstein monofilament wire test diminished   bilaterally. Muscle power within normal limits bilaterally.  Nails Thick disfigured discolored nails with subungual debris  from hallux to fifth toes bilaterally. No evidence of bacterial infection or drainage bilaterally.  Orthopedic  No limitations of motion  feet .  No crepitus or effusions noted.  HAV  B/L.  Hammer toes 2-5  B/L.  Skin  normotropic skin with no porokeratosis noted bilaterally.  No signs of infections or ulcers noted.     Onychomycosis  Pain in right toes  Pain in left toes  Consent was obtained for treatment procedures.   Mechanical debridement of nails 1-5  bilaterally performed with a nail nipper.  Filed with dremel without incident.    Return office visit   3 months                   Told patient to return for periodic foot care and evaluation due to potential at risk complications.   Yaniris Braddock DPM  

## 2020-06-22 ENCOUNTER — Ambulatory Visit: Payer: Medicare Other | Attending: Critical Care Medicine

## 2020-06-22 ENCOUNTER — Ambulatory Visit: Payer: Medicare Other | Admitting: *Deleted

## 2020-06-22 DIAGNOSIS — Z23 Encounter for immunization: Secondary | ICD-10-CM

## 2020-06-22 NOTE — Progress Notes (Signed)
Patient presents for BOOSTER and reports no concerns with previous doses. Patient observed with no concerns.

## 2020-06-22 NOTE — Progress Notes (Signed)
° °  Covid-19 Vaccination Clinic  Name:  [name] [name]    MRN: [id number] DOB: [birth year]  06/22/2020  [name] [name] was observed post Covid-19 immunization for 15 minutes without incident. He was provided with Vaccine Information Sheet and instruction to access the V-Safe system.   [name] [name] was instructed to call 911 with any severe reactions post vaccine:  Difficulty breathing   Swelling of face and throat   A fast heartbeat   A bad rash all over body   Dizziness and weakness

## 2020-06-28 ENCOUNTER — Encounter: Payer: Medicare Other | Attending: Registered Nurse | Admitting: Physical Medicine & Rehabilitation

## 2020-06-28 DIAGNOSIS — Z8673 Personal history of transient ischemic attack (TIA), and cerebral infarction without residual deficits: Secondary | ICD-10-CM | POA: Insufficient documentation

## 2020-06-28 DIAGNOSIS — E7849 Other hyperlipidemia: Secondary | ICD-10-CM | POA: Insufficient documentation

## 2020-06-28 DIAGNOSIS — Z9889 Other specified postprocedural states: Secondary | ICD-10-CM | POA: Insufficient documentation

## 2020-06-28 DIAGNOSIS — Z8679 Personal history of other diseases of the circulatory system: Secondary | ICD-10-CM | POA: Insufficient documentation

## 2020-06-28 DIAGNOSIS — I7103 Dissection of thoracoabdominal aorta: Secondary | ICD-10-CM | POA: Insufficient documentation

## 2020-06-28 DIAGNOSIS — I10 Essential (primary) hypertension: Secondary | ICD-10-CM | POA: Insufficient documentation

## 2020-07-01 ENCOUNTER — Telehealth: Payer: Self-pay | Admitting: *Deleted

## 2020-07-06 ENCOUNTER — Other Ambulatory Visit: Payer: Self-pay | Admitting: *Deleted

## 2020-07-06 DIAGNOSIS — I71019 Dissection of thoracic aorta, unspecified: Secondary | ICD-10-CM

## 2020-07-06 DIAGNOSIS — I7101 Dissection of thoracic aorta: Secondary | ICD-10-CM

## 2020-07-11 ENCOUNTER — Ambulatory Visit: Payer: Medicare Other | Admitting: Adult Health

## 2020-07-11 ENCOUNTER — Encounter: Payer: Self-pay | Admitting: Adult Health

## 2020-07-11 VITALS — BP 176/98 | HR 88 | Ht 72.0 in | Wt 204.0 lb

## 2020-07-11 DIAGNOSIS — I749 Embolism and thrombosis of unspecified artery: Secondary | ICD-10-CM

## 2020-07-11 DIAGNOSIS — G3184 Mild cognitive impairment, so stated: Secondary | ICD-10-CM

## 2020-07-11 NOTE — Progress Notes (Signed)
[facility] [address] [place]. [place] [address] [phone]       OFFICE FOLLOW-UP NOTE  Mr. Ellery Meroney Vandevoort Date of Birth:  1946/07/18 Medical Record Number:  [id number]    Chief Complaint  Patient presents with  . Follow-up    tx rm, stroke fu, imbalance and cognitive impairment -stable with improvement since prior visit      HPI:   Today, 07/11/2020, [name] [name] is being seen for 50-month stroke follow-up accompanied by his wife.  Reports residual occasional imbalance and cognitive impairment with improvement since prior visit.  He has since completed therapies but continues to do exercises at home as recommended.  He questions potential return to driving.  Denies new stroke/TIA symptoms.  Remains on aspirin and Crestor for secondary stroke prevention of side effects.  Blood pressure today 176/98 and similar on recheck.   Occasionally monitors at home typically SBP 150s per wife.  No further concerns at this time.    History provided for reference purposes only Initial visit 01/07/2020 Dr. Leonie Man: Mr. Aldaco is a 74 year old pleasant African-American male seen today for initial office follow-up visit following hospital consultation for stroke in February 2021.  Is accompanied by his wife.  History is obtained from them, review of electronic medical records and I personally reviewed imaging films in PACS.  He has past medical history of hyperlipidemia, hypertension, presented to [facility] with back pain and epigastric pain on 10/08/2019 and was found to have distal thoracic aortic aneurysm and small bilateral pulmonary embolisms.  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  He developed altered mental status and weakness in 10/17/2019 and CT scan showed hypodensities in right cerebellum and right occipital lobe suspicious for infarcts.  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  MRA of the head showed chronic  moderate left P1 stenosis.  Carotid Dopplers were unremarkable.  2D echo showed normal ejection fraction.  LDL cholesterol was 72 mg percent hemoglobin A1c was 6.7.  Patient was initially started on IV heparin and was planned for being discharged on Eliquis and went to inpatient rehab.  However his medication list today states that he is on aspirin 81 mg only and patient is unable to tell me the reason for this.  He is currently getting home physical and occupational therapy which she has just finished but outpatient therapy has not yet started.  He walks with a wheeled walker but feels his balance is off and tends to fall backwards and has poor truncal balance.  He can walk with a walker short distances well.  He has had no falls or injuries.  He just had follow-up visit with Dr. Letta Pate today.  He does complain of mild memory difficulties and cognitive impairment since his stroke.  This is not progressive.   ROS:   14 system review of systems is positive for those listed in HPI and all other systems negative PMH:  Past Medical History:  Diagnosis Date  . Chronic back pain    stenosis  . Enlarged prostate   . GERD (gastroesophageal reflux disease)    occasionally will take a zantac(maybe once a month)  . History of colon polyps   . History of kidney stones   . History of stress test    done 10 yrs. ago, as a baseline   . Hyperlipidemia    takes Crestor daily  . Hypertension    takes Amlodipine daily and Lotensin as well  .  Stroke [facility])     Social History:  Social History   Socioeconomic History  . Marital status: Married    Spouse name: Not on file  . Number of children: Not on file  . Years of education: Not on file  . Highest education level: Not on file  Occupational History  . Not on file  Tobacco Use  . Smoking status: Former Smoker    Packs/day: 0.25    Years: 40.00    Pack years: 10.00    Types: Cigarettes  . Smokeless tobacco: Never Used  . Tobacco comment: none  since 06/24/13  Vaping Use  . Vaping Use: Never used  Substance and Sexual Activity  . Alcohol use: No  . Drug use: No  . Sexual activity: Not on file  Other Topics Concern  . Not on file  Social History Narrative  . Not on file   Social Determinants of Health   Financial Resource Strain:   . Difficulty of Paying Living Expenses: Not on file  Food Insecurity:   . Worried About Charity fundraiser in the Last Year: Not on file  . Ran Out of Food in the Last Year: Not on file  Transportation Needs:   . Lack of Transportation (Medical): Not on file  . Lack of Transportation (Non-Medical): Not on file  Physical Activity:   . Days of Exercise per Week: Not on file  . Minutes of Exercise per Session: Not on file  Stress:   . Feeling of Stress : Not on file  Social Connections:   . Frequency of Communication with Friends and Family: Not on file  . Frequency of Social Gatherings with Friends and Family: Not on file  . Attends Religious Services: Not on file  . Active Member of Clubs or Organizations: Not on file  . Attends Archivist Meetings: Not on file  . Marital Status: Not on file  Intimate Partner Violence:   . Fear of Current or Ex-Partner: Not on file  . Emotionally Abused: Not on file  . Physically Abused: Not on file  . Sexually Abused: Not on file    Medications:   Current Outpatient Medications on File Prior to Visit  Medication Sig Dispense Refill  . acetaminophen (TYLENOL) 500 MG tablet Take 1,000 mg by mouth every 6 (six) hours as needed for headache (pain).    Marland Kitchen amLODipine (NORVASC) 2.5 MG tablet Take 2.5 mg by mouth daily.     . Ascorbic Acid (VITAMIN C) 500 MG CAPS Take 500 mg by mouth daily.     Marland Kitchen aspirin EC 81 MG tablet Take 81 mg by mouth daily.    . Calcium Carbonate-Vitamin D (CALCIUM-D PO) Take 1 tablet by mouth daily.    . carvedilol (COREG) 25 MG tablet Take 1 tablet (25 mg total) by mouth 2 (two) times daily with a meal. 60 tablet 0  .  Cyanocobalamin (VITAMIN B-12 PO) Take 1 tablet by mouth daily.    Marland Kitchen gabapentin (NEURONTIN) 300 MG capsule Take 300 mg by mouth 2 (two) times daily.    . hydrALAZINE (APRESOLINE) 10 MG tablet Take 1 tablet (10 mg total) by mouth every 6 (six) hours. 180 tablet 0  . magnesium citrate SOLN Take 1 Bottle by mouth daily as needed for severe constipation.     . metFORMIN (GLUCOPHAGE) 500 MG tablet Take 500 mg by mouth 2 (two) times daily.    . Multiple Vitamin (MULTIVITAMIN WITH MINERALS) TABS tablet Take 1  tablet by mouth daily.    . pantoprazole (PROTONIX) 40 MG tablet Take 1 tablet (40 mg total) by mouth daily. 30 tablet 0  . rosuvastatin (CRESTOR) 10 MG tablet Take 1 tablet (10 mg total) by mouth daily. 30 tablet 0  . tamsulosin (FLOMAX) 0.4 MG CAPS capsule Take 1 capsule (0.4 mg total) by mouth daily. 30 capsule 0   No current facility-administered medications on file prior to visit.    Allergies:   Allergies  Allergen Reactions  . Lyrica [Pregabalin] Nausea Only and Other (See Comments)    Hallucinations and dizziness, also  . Sulfa Antibiotics Other (See Comments)    Unknown reaction    Physical Exam Today's Vitals   07/11/20 1010  BP: (!) 176/98  Pulse: 88  Weight: 204 lb (92.5 kg)  Height: 6' (1.829 m)   Body mass index is 27.67 kg/m.   General: well developed, well nourished elderly African-American male, seated, in no evident distress Head: head normocephalic and atraumatic.  Neck: supple with no carotid or supraclavicular bruits Cardiovascular: regular rate and rhythm, no murmurs Musculoskeletal: no deformity Skin:  no rash/petichiae Vascular:  Normal pulses all extremities  Neurologic Exam Mental Status: Awake and fully alert. Oriented to place and time. Recent and remote memory diminished. attention span, concentration and fund of knowledge diminished. Mood and affect appropriate.  Recall 3/3.  Able to name only 8 animals which can walk on 4 legs in 60 seconds.  Difficulty with serial addition. Clock drawing 4/4. Cranial Nerves: Pupils equal, briskly reactive to light. Extraocular movements full without nystagmus. Visual fields full to confrontation. Hearing intact. Facial sensation intact. Face, tongue, palate moves normally and symmetrically.  Motor: Normal bulk and tone. Normal strength in all tested extremity muscles. Sensory.: intact to touch ,pinprick .position and vibratory sensation.  Coordination: Rapid alternating movements normal in all extremities. Finger-to-nose and heel-to-shin performed accurately bilaterally. Gait and Station: Arises from chair without difficulty. Stance is broad-based gait demonstrates mild ataxia and mild imbalance but able to ambulate without assistive device. Unable to perform tandem walk, and heel toe  reflexes: 1+ and symmetric. Toes downgoing.       ASSESSMENT/PLAN: 74 year old African-American male with multiple by cerebral infarcts in February 2021 s/p aortic graft surgery for aortic dissection.  He is doing reasonably well with mild memory and cognitive impairment and gait imbalance.    1. Multiple small brain infarcts:  a. Residual deficit: Mild cognitive and gait impairment. Encouraged continued exercises at home as recommended by therapy. Advised to refrain from return to driving for potential further recovery of cognition. May need OT driving evaluation or neurocognitive evaluation prior to return to driving.  b. Continue aspirin 81 mg daily  and Crestor for secondary stroke prevention.   c. Discussed secondary stroke prevention measures and importance of close PCP f/u for aggressive stroke risk factor management  2. HTN: BP goal <130/90. Stable today on hydralazine, carvedilol and amlodipine per PCP 3. HLD: LDL goal <70. On Crestor 10 mg daily per PCP 4. 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  On Metformin per PCP    Follow-up in 6 months or call earlier if needed  CC:  [place] provider: Dr. Cleatrice Burke, [place] Ar, MD     I spent 30 minutes of face-to-face and non-face-to-face time with patient and wife.  This included previsit chart review, lab review, study review, order entry, electronic health record documentation, patient education and discussion regarding history of prior stroke, residual deficits, importance of managing stroke risk factors  and answered all other questions to patients and wifes satisfaction    Frann Rider, [facility]  [facility] [address] [place], [place] [address]  Phone [phone] Fax [phone] Note: This document was prepared with digital dictation and possible smart phrase technology. Any transcriptional errors that result from this process are unintentional.

## 2020-07-11 NOTE — Patient Instructions (Addendum)
Continue to do exercises at home as recommended during therapy sessions  You can go to websites for memory exercises such as [facility] or [facility]    Continue aspirin 81 mg daily  and Crestor  for secondary stroke prevention  Continue to follow up with PCP regarding cholesterol, blood pressure and diabetes management  Maintain strict control of hypertension with blood pressure goal below 130/90, diabetes with hemoglobin A1c goal below 7% and cholesterol with LDL cholesterol (bad cholesterol) goal below 70 mg/dL.       Followup in the future with me in 6 months or call earlier if needed     Thank you for coming to see [place] at [facility]. I hope we have been able to provide you high quality care today.  You may receive a patient satisfaction survey over the next few weeks. We would appreciate your feedback and comments so that we may continue to improve ourselves and the health of our patients.

## 2020-07-12 NOTE — Progress Notes (Signed)
I agree with the above plan 

## 2020-07-19 ENCOUNTER — Ambulatory Visit (HOSPITAL_COMMUNITY)
Admission: RE | Admit: 2020-07-19 | Discharge: 2020-07-19 | Disposition: A | Discharge status: Home / Self Care | Payer: Medicare Other | Source: Ambulatory Visit | Attending: Vascular Surgery | Admitting: Vascular Surgery

## 2020-07-19 ENCOUNTER — Encounter (HOSPITAL_COMMUNITY): Payer: Self-pay

## 2020-07-19 ENCOUNTER — Other Ambulatory Visit: Payer: Self-pay

## 2020-07-19 DIAGNOSIS — I7101 Dissection of thoracic aorta: Secondary | ICD-10-CM

## 2020-07-19 DIAGNOSIS — I71019 Dissection of thoracic aorta, unspecified: Secondary | ICD-10-CM

## 2020-07-19 LAB — POCT I-STAT CREATININE: Creatinine, Ser: 1.3 mg/dL — ABNORMAL HIGH (ref 0.61–1.24)

## 2020-07-19 MED ORDER — IOHEXOL 350 MG/ML SOLN
100.0000 mL | Freq: Once | INTRAVENOUS | Status: AC | PRN
  Administered 2020-07-19: 100 mL via INTRAVENOUS

## 2020-07-22 ENCOUNTER — Other Ambulatory Visit: Payer: Self-pay

## 2020-07-22 ENCOUNTER — Encounter: Payer: Self-pay | Admitting: Vascular Surgery

## 2020-07-22 ENCOUNTER — Ambulatory Visit (INDEPENDENT_AMBULATORY_CARE_PROVIDER_SITE_OTHER): Payer: Medicare Other | Admitting: Vascular Surgery

## 2020-07-22 VITALS — BP 134/84 | HR 56 | Temp 98.2°F | Resp 20 | Ht 72.0 in | Wt 201.0 lb

## 2020-07-22 DIAGNOSIS — I71019 Dissection of thoracic aorta, unspecified: Secondary | ICD-10-CM

## 2020-07-22 DIAGNOSIS — I7101 Dissection of thoracic aorta: Secondary | ICD-10-CM | POA: Diagnosis not present

## 2020-07-22 NOTE — Progress Notes (Signed)
Patient ID: [name] [name] Basic, male   DOB: August 25, 1945, 74 y.o.   MRN: [id number]  Reason for Consult: Follow-up   Referred by Wenda Low, MD  Subjective:     HPI:  [name] [name] is a 74 y.o. 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  He underwent thoracic endograft doing and had an embolic stroke in the perioperative period.  He has been followed by neurology for this.  He does have persistent back pain has also had some issues with urination of late he states that he has been evaluated by urology in the past.  He does walk with the help of a walker.  Patient's chief complaint today is that he wants to drive.  Past Medical History:  Diagnosis Date  . Chronic back pain    stenosis  . Enlarged prostate   . GERD (gastroesophageal reflux disease)    occasionally will take a zantac(maybe once a month)  . History of colon polyps   . History of kidney stones   . History of stress test    done 10 yrs. ago, as a baseline   . Hyperlipidemia    takes Crestor daily  . Hypertension    takes Amlodipine daily and Lotensin as well  . Stroke [facility])    No family history on file. Past Surgical History:  Procedure Laterality Date  . ANTERIOR LAT LUMBAR FUSION Left 12/15/2014   Procedure: ANTERIOR LATERAL LUMBAR FUSION 1 LEVEL;  Surgeon: Phylliss Bob, MD;  Location: [place];  Service: Orthopedics;  Laterality: Left;  Left sided lateral lumbar interbody fusion, lumbar 3-4, posterior spinal fusion, lumbar 3-4 with instrumentation.  . COLONOSCOPY    . ESOPHAGOGASTRODUODENOSCOPY    . fatty tissue removed from stomach    . LUMBAR LAMINECTOMY/DECOMPRESSION MICRODISCECTOMY N/A 07/08/2013   Procedure: LUMBAR LAMINECTOMY/DECOMPRESSION MICRODISCECTOMY;  Surgeon: Sinclair Ship, MD;  Location: [place];  Service: Orthopedics;  Laterality: N/A;  Lumbar 3-4, lumbar 4-5 decompression  . RADIOLOGY WITH ANESTHESIA N/A 10/22/2019   Procedure: MRI WITH ANESTHESIA OF LUMBAR  SPINE WITHOUT CONTRAST;  Surgeon: Radiologist, Medication, MD;  Location: [place];  Service: Radiology;  Laterality: N/A;  . right ankle surgery     as child   . THORACIC AORTIC ENDOVASCULAR STENT GRAFT N/A 10/15/2019   Procedure: THORACIC AORTIC ENDOVASCULAR STENT GRAFT;  Surgeon: Waynetta Sandy, MD;  Location: [place];  Service: Vascular;  Laterality: N/A;  . TONSILLECTOMY     as a child    Short Social History:  Social History   Tobacco Use  . Smoking status: Former Smoker    Packs/day: 0.25    Years: 40.00    Pack years: 10.00    Types: Cigarettes  . Smokeless tobacco: Never Used  . Tobacco comment: none since 06/24/13  Substance Use Topics  . Alcohol use: No    Allergies  Allergen Reactions  . Lyrica [Pregabalin] Nausea Only and Other (See Comments)    Hallucinations and dizziness, also  . Sulfa Antibiotics Other (See Comments)    Unknown reaction    Current Outpatient Medications  Medication Sig Dispense Refill  . acetaminophen (TYLENOL) 500 MG tablet Take 1,000 mg by mouth every 6 (six) hours as needed for headache (pain).    Marland Kitchen amLODipine (NORVASC) 2.5 MG tablet Take 2.5 mg by mouth daily.     . Ascorbic Acid (VITAMIN C) 500 MG CAPS Take 500 mg by mouth daily.     Marland Kitchen aspirin EC 81  MG tablet Take 81 mg by mouth daily.    . Calcium Carbonate-Vitamin D (CALCIUM-D PO) Take 1 tablet by mouth daily.    . carvedilol (COREG) 25 MG tablet Take 1 tablet (25 mg total) by mouth 2 (two) times daily with a meal. 60 tablet 0  . Cyanocobalamin (VITAMIN B-12 PO) Take 1 tablet by mouth daily.    Marland Kitchen gabapentin (NEURONTIN) 300 MG capsule Take 300 mg by mouth 2 (two) times daily.    . hydrALAZINE (APRESOLINE) 10 MG tablet Take 1 tablet (10 mg total) by mouth every 6 (six) hours. 180 tablet 0  . magnesium citrate SOLN Take 1 Bottle by mouth daily as needed for severe constipation.     . metFORMIN (GLUCOPHAGE) 500 MG tablet Take 500 mg by mouth 2 (two) times daily.    . Multiple  Vitamin (MULTIVITAMIN WITH MINERALS) TABS tablet Take 1 tablet by mouth daily.    . pantoprazole (PROTONIX) 40 MG tablet Take 1 tablet (40 mg total) by mouth daily. 30 tablet 0  . rosuvastatin (CRESTOR) 10 MG tablet Take 1 tablet (10 mg total) by mouth daily. 30 tablet 0  . tamsulosin (FLOMAX) 0.4 MG CAPS capsule Take 1 capsule (0.4 mg total) by mouth daily. 30 capsule 0   No current facility-administered medications for this visit.    Review of Systems  Constitutional:  Constitutional negative. HENT: HENT negative.  Eyes: Eyes negative.  Cardiovascular: Cardiovascular negative.  GI: Gastrointestinal negative.  Musculoskeletal: Positive for back pain.  Skin: Skin negative.  Neurological: Neurological negative. Hematologic: Hematologic/lymphatic negative.  Psychiatric: Psychiatric negative.        Objective:  Objective  Vitals:   07/22/20 1225  BP: 134/84  Pulse: (!) 56  Resp: 20  Temp: 98.2 F (36.8 C)  SpO2: 98%    Physical Exam Constitutional:      Appearance: Normal appearance.  HENT:     Head: Normocephalic.     Nose:     Comments: Wearing a mask Eyes:     Pupils: Pupils are equal, round, and reactive to light.  Cardiovascular:     Rate and Rhythm: Normal rate and regular rhythm.     Pulses: Normal pulses.  Pulmonary:     Effort: Pulmonary effort is normal.  Abdominal:     General: Abdomen is flat.     Palpations: Abdomen is soft.  Musculoskeletal:        General: No swelling. Normal range of motion.  Skin:    General: Skin is warm and dry.     Capillary Refill: Capillary refill takes less than 2 seconds.  Neurological:     General: No focal deficit present.     Mental Status: He is alert and oriented to [place], place, and time.  Psychiatric:        Mood and Affect: Mood normal.        Behavior: Behavior normal.        Thought Content: Thought content normal.        Judgment: Judgment normal.     Data: Abdomen and pelvic CTA impression  1.  Interval increase in size of aneurysmal dilatation of the infrarenal abdominal aortic aneurysm, currently measuring 4.5 x 4.5 x 3.9 cm, previously, 4.1 x 4.1 x 3.9 cm. No perivascular stranding. 2. Redemonstrated extension of the descending thoracic aortic dissection to the level of the distal abdominal aorta, again with extension to through the main trunk of the SMA though not resulting in a hemodynamically significant stenosis.  Nonvascular Impression:  1. Findings worrisome for metastatic prostate cancer with marked prostatomegaly (measuring at least 7.7 cm) with suspected 3.0 cm peripherally enhancing nodule about the dome of the prostate, apparent pathologic enlargement of the left common iliac chain lymph node as well as an approximately 1.3 cm sclerotic lesion within the T8 vertebral body. If not recently performed, further evaluation with DRE and the acquisition of PSA level is advised. Additionally, further evaluation with pelvic and/or thoracic spine contrast-enhanced MRI could be performed as indicated.     Assessment/Plan:     74 year old male follows up with CT scan after thoracic endograft doing for dissection with aneurysmal degeneration.  Scan demonstrates appropriate fixation of the dissection with a type II endoleak mildly enlarged aneurysm in the abdominal aorta does not need intervention at this time.  Have discussed with he and his wife the concerning features of the CT scan that are concerning for metastatic prostate cancer with a large prostate measuring 7.7 cm and a suspected nodule and also possible sclerotic lesion within the T8 vertebral body.  We will refer to oncology for evaluation.  I will see him back in 1 year with repeat CT angio     Waynetta Sandy MD [facility]

## 2020-07-26 ENCOUNTER — Encounter: Payer: Medicare Other | Attending: Registered Nurse | Admitting: Physical Medicine & Rehabilitation

## 2020-07-26 ENCOUNTER — Encounter: Payer: Self-pay | Admitting: Physical Medicine & Rehabilitation

## 2020-07-26 ENCOUNTER — Other Ambulatory Visit: Payer: Self-pay

## 2020-07-26 VITALS — BP 172/102 | HR 57 | Temp 98.3°F | Ht 72.0 in | Wt 200.0 lb

## 2020-07-26 DIAGNOSIS — I69398 Other sequelae of cerebral infarction: Secondary | ICD-10-CM | POA: Diagnosis present

## 2020-07-26 DIAGNOSIS — R269 Unspecified abnormalities of gait and mobility: Secondary | ICD-10-CM | POA: Insufficient documentation

## 2020-07-26 DIAGNOSIS — I69319 Unspecified symptoms and signs involving cognitive functions following cerebral infarction: Secondary | ICD-10-CM | POA: Diagnosis not present

## 2020-07-26 NOTE — Patient Instructions (Signed)
Please do your therapy exercises  Going to [facility] is a good idea  Recumbent bicycle no treadmill or elliptical   Use machines not free weight  Senior aquatic exercise program

## 2020-07-26 NOTE — Progress Notes (Signed)
Subjective:    Patient ID: [name] [name], male    DOB: April 06, 1946, 74 y.o.   MRN: [id number] 73 y.o. right-handed male with history of chronic back pain with lumbar discectomy 2014 as well as anterior lateral lumbar fusion 2016, hypertension, diabetes mellitus, hyperlipidemia and remote tobacco abuse.  Per chart review lives with spouse independent prior to admission.  Presented 10/08/2019 with lower chest and back pain with epigastric discomfort times several weeks as well as bouts of nausea and vomiting.  In the ED blood pressure 180s/130s.  EKG showed nonspecific ST-T changes not significantly different from prior EKG 2016.  Troponin high-sensitivity 28, potassium 3.3, creatinine 1.25, hemoglobin 14.5, SARS coronavirus negative.  Chest x-ray no active disease.  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  Small pulmonary embolus involving the central right lobe pulmonary artery as well as probable additional small subsegmental embolus in the right lower lobe.  Echocardiogram with ejection fraction 65%.  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 Donzetta Matters.  Placed on intravenous heparin therapy.  Neurology consulted 10/17/2019 for altered mental status.  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  Carotid Dopplers with no ICA stenosis.  Neurology follow-up heparin was transitioned to Eliquis.  Hospital course blood loss anemia 9.3 follow-up GI services there was some fear of possible GI bleed related to intravenous heparin as well as transition to Eliquis no overt bleeding noted hemoglobin hematocrit remained stable no further work-up indicated.  Patient was admitted for a comprehensive rehab program.     Admit date: 10/24/2019 Discharge date: 11/13/2019  HPI Pt doing well mod I with  walker, wife still concerned about balance and cognition.  Pt has goals of return to driving Not doing any regular exercise but he and wife plan to re join [employer] No falls , Mod I ADLs Diagnosed with prostate CA and will see oncology Has been seen by vascular surgery, 1 year f/u  Pain Inventory Average Pain 4 Pain Right Now 4 My pain is dull  LOCATION OF PAIN  back  BOWEL Number of stools per week: 3 Oral laxative use No  Type of laxative  Enema or suppository use No  History of colostomy No  Incontinent No   BLADDER Normal In and out cath, frequency Able to self cath na Bladder incontinence Yes  Frequent urination No  Leakage with coughing No  Difficulty starting stream No  Incomplete bladder emptying No    Mobility walk with assistance use a walker ability to climb steps?  yes do you drive?  no  Function I need assistance with the following:  meal prep, household duties and shopping  Neuro/Psych weakness trouble walking  Prior Studies Any changes since last visit?  no  Physicians involved in your care Any changes since last visit?  no   No family history on file. Social History   Socioeconomic History  . Marital status: Married    Spouse name: Not on file  . Number of children: Not on file  . Years of education: Not on file  . Highest education level: Not on file  Occupational History  . Not on file  Tobacco Use  . Smoking status: Former Smoker    Packs/day: 0.25    Years: 40.00    Pack years: 10.00    Types: Cigarettes  .  Smokeless tobacco: Never Used  . Tobacco comment: none since 06/24/13  Vaping Use  . Vaping Use: Never used  Substance and Sexual Activity  . Alcohol use: No  . Drug use: No  . Sexual activity: Not on file  Other Topics Concern  . Not on file  Social History Narrative  . Not on file   Social Determinants of Health   Financial Resource Strain:   . Difficulty of Paying Living Expenses: Not on file  Food Insecurity:     . Worried About Charity fundraiser in the Last Year: Not on file  . Ran Out of Food in the Last Year: Not on file  Transportation Needs:   . Lack of Transportation (Medical): Not on file  . Lack of Transportation (Non-Medical): Not on file  Physical Activity:   . Days of Exercise per Week: Not on file  . Minutes of Exercise per Session: Not on file  Stress:   . Feeling of Stress : Not on file  Social Connections:   . Frequency of Communication with Friends and Family: Not on file  . Frequency of Social Gatherings with Friends and Family: Not on file  . Attends Religious Services: Not on file  . Active Member of Clubs or Organizations: Not on file  . Attends Archivist Meetings: Not on file  . Marital Status: Not on file   Past Surgical History:  Procedure Laterality Date  . ANTERIOR LAT LUMBAR FUSION Left 12/15/2014   Procedure: ANTERIOR LATERAL LUMBAR FUSION 1 LEVEL;  Surgeon: Phylliss Bob, MD;  Location: [facility];  Service: Orthopedics;  Laterality: Left;  Left sided lateral lumbar interbody fusion, lumbar 3-4, posterior spinal fusion, lumbar 3-4 with instrumentation.  . COLONOSCOPY    . ESOPHAGOGASTRODUODENOSCOPY    . fatty tissue removed from stomach    . LUMBAR LAMINECTOMY/DECOMPRESSION MICRODISCECTOMY N/A 07/08/2013   Procedure: LUMBAR LAMINECTOMY/DECOMPRESSION MICRODISCECTOMY;  Surgeon: Sinclair Ship, MD;  Location: [facility];  Service: Orthopedics;  Laterality: N/A;  Lumbar 3-4, lumbar 4-5 decompression  . RADIOLOGY WITH ANESTHESIA N/A 10/22/2019   Procedure: MRI WITH ANESTHESIA OF LUMBAR SPINE WITHOUT CONTRAST;  Surgeon: Radiologist, Medication, MD;  Location: [facility];  Service: Radiology;  Laterality: N/A;  . right ankle surgery     as child   . THORACIC AORTIC ENDOVASCULAR STENT GRAFT N/A 10/15/2019   Procedure: THORACIC AORTIC ENDOVASCULAR STENT GRAFT;  Surgeon: Waynetta Sandy, MD;  Location: [facility];  Service: Vascular;  Laterality: N/A;  .  TONSILLECTOMY     as a child   Past Medical History:  Diagnosis Date  . Chronic back pain    stenosis  . Enlarged prostate   . GERD (gastroesophageal reflux disease)    occasionally will take a zantac(maybe once a month)  . History of colon polyps   . History of kidney stones   . History of stress test    done 10 yrs. ago, as a baseline   . Hyperlipidemia    takes Crestor daily  . Hypertension    takes Amlodipine daily and Lotensin as well  . Stroke ([place])    BP (!) 172/102   Pulse (!) 57   Temp 98.3 F (36.8 C)   Ht 6' (1.829 m)   Wt 200 lb (90.7 kg)   SpO2 95%   BMI 27.12 kg/m   Opioid Risk Score:   Fall Risk Score:  `1  Depression screen PHQ 2/9  Depression screen [facility] 2/9 11/24/2019  Decreased  Interest 2  Down, Depressed, Hopeless 1  PHQ - 2 Score 3  Altered sleeping 0  Tired, decreased energy 1  Change in appetite 0  Feeling bad or failure about yourself  1  Trouble concentrating 2  Moving slowly or fidgety/restless 1  Suicidal thoughts 0  PHQ-9 Score 8    Review of Systems     Objective:   Physical Exam Vitals and nursing note reviewed.  Constitutional:      Appearance: He is normal weight.  HENT:     Head: Normocephalic and atraumatic.  Eyes:     Extraocular Movements: Extraocular movements intact.     Conjunctiva/sclera: Conjunctivae normal.     Pupils: Pupils are equal, round, and reactive to light.  Neurological:     Mental Status: He is alert and oriented to [place], place, and time.     Comments: Amb with walker Mod I or sup without device  Motor 5/5 in bilaterak delt , bi , tri , grip, HF, KE, ADF  Psychiatric:        Mood and Affect: Mood normal.        Speech: Speech is delayed.        Behavior: Behavior is slowed.           Assessment & Plan:  #1.  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  Overall he has made a good recovery.  He still has slowed processing.  We discussed  that issue as it relates to his goal of getting back to driving.  Certainly I would think his reaction time would be slow to the point that driving would not be advisable.  He is willing to wait.  Also encouraged him to talk to his neurologist. Advised that he continues his walking as well as home exercise program. He will continue follow-up with vascular surgery for his aortic aneurysm. Physical medicine rehab follow-up on as-needed basis

## 2020-08-18 ENCOUNTER — Other Ambulatory Visit: Payer: Self-pay | Admitting: Urology

## 2020-08-18 DIAGNOSIS — D400 Neoplasm of uncertain behavior of prostate: Secondary | ICD-10-CM

## 2020-09-14 DIAGNOSIS — M5416 Radiculopathy, lumbar region: Secondary | ICD-10-CM | POA: Diagnosis not present

## 2020-09-15 ENCOUNTER — Other Ambulatory Visit: Payer: Self-pay

## 2020-09-15 ENCOUNTER — Ambulatory Visit
Admission: RE | Admit: 2020-09-15 | Discharge: 2020-09-15 | Disposition: A | Discharge status: Home / Self Care | Payer: Medicare Other | Source: Ambulatory Visit | Attending: Urology | Admitting: Urology

## 2020-09-15 DIAGNOSIS — D400 Neoplasm of uncertain behavior of prostate: Secondary | ICD-10-CM

## 2020-09-15 MED ORDER — GADOBENATE DIMEGLUMINE 529 MG/ML IV SOLN
19.0000 mL | Freq: Once | INTRAVENOUS | Status: AC | PRN
  Administered 2020-09-15: 19 mL via INTRAVENOUS

## 2020-09-16 DIAGNOSIS — N183 Chronic kidney disease, stage 3 unspecified: Secondary | ICD-10-CM | POA: Diagnosis not present

## 2020-09-16 DIAGNOSIS — K219 Gastro-esophageal reflux disease without esophagitis: Secondary | ICD-10-CM | POA: Diagnosis not present

## 2020-09-16 DIAGNOSIS — I10 Essential (primary) hypertension: Secondary | ICD-10-CM | POA: Diagnosis not present

## 2020-09-16 DIAGNOSIS — E1122 Type 2 diabetes mellitus with diabetic chronic kidney disease: Secondary | ICD-10-CM | POA: Diagnosis not present

## 2020-09-16 DIAGNOSIS — E118 Type 2 diabetes mellitus with unspecified complications: Secondary | ICD-10-CM | POA: Diagnosis not present

## 2020-09-16 DIAGNOSIS — I639 Cerebral infarction, unspecified: Secondary | ICD-10-CM | POA: Diagnosis not present

## 2020-09-16 DIAGNOSIS — E782 Mixed hyperlipidemia: Secondary | ICD-10-CM | POA: Diagnosis not present

## 2020-09-21 ENCOUNTER — Ambulatory Visit: Payer: Medicare Other | Admitting: Podiatry

## 2020-09-21 ENCOUNTER — Encounter: Payer: Self-pay | Admitting: Podiatry

## 2020-09-21 ENCOUNTER — Other Ambulatory Visit: Payer: Self-pay

## 2020-09-21 DIAGNOSIS — Z6831 Body mass index (BMI) 31.0-31.9, adult: Secondary | ICD-10-CM | POA: Insufficient documentation

## 2020-09-21 DIAGNOSIS — B351 Tinea unguium: Secondary | ICD-10-CM | POA: Diagnosis not present

## 2020-09-21 DIAGNOSIS — E1121 Type 2 diabetes mellitus with diabetic nephropathy: Secondary | ICD-10-CM | POA: Insufficient documentation

## 2020-09-21 DIAGNOSIS — N183 Chronic kidney disease, stage 3 unspecified: Secondary | ICD-10-CM | POA: Diagnosis not present

## 2020-09-21 DIAGNOSIS — M79675 Pain in left toe(s): Secondary | ICD-10-CM | POA: Diagnosis not present

## 2020-09-21 DIAGNOSIS — R269 Unspecified abnormalities of gait and mobility: Secondary | ICD-10-CM | POA: Insufficient documentation

## 2020-09-21 DIAGNOSIS — E1140 Type 2 diabetes mellitus with diabetic neuropathy, unspecified: Secondary | ICD-10-CM | POA: Diagnosis not present

## 2020-09-21 DIAGNOSIS — E118 Type 2 diabetes mellitus with unspecified complications: Secondary | ICD-10-CM | POA: Insufficient documentation

## 2020-09-21 DIAGNOSIS — Z8659 Personal history of other mental and behavioral disorders: Secondary | ICD-10-CM | POA: Insufficient documentation

## 2020-09-21 DIAGNOSIS — M519 Unspecified thoracic, thoracolumbar and lumbosacral intervertebral disc disorder: Secondary | ICD-10-CM | POA: Insufficient documentation

## 2020-09-21 DIAGNOSIS — Z860100 Personal history of colon polyps, unspecified: Secondary | ICD-10-CM | POA: Insufficient documentation

## 2020-09-21 DIAGNOSIS — E7800 Pure hypercholesterolemia, unspecified: Secondary | ICD-10-CM | POA: Insufficient documentation

## 2020-09-21 DIAGNOSIS — M79674 Pain in right toe(s): Secondary | ICD-10-CM | POA: Diagnosis not present

## 2020-09-21 DIAGNOSIS — R471 Dysarthria and anarthria: Secondary | ICD-10-CM | POA: Insufficient documentation

## 2020-09-21 DIAGNOSIS — N400 Enlarged prostate without lower urinary tract symptoms: Secondary | ICD-10-CM | POA: Insufficient documentation

## 2020-09-21 DIAGNOSIS — N529 Male erectile dysfunction, unspecified: Secondary | ICD-10-CM | POA: Insufficient documentation

## 2020-09-21 DIAGNOSIS — J309 Allergic rhinitis, unspecified: Secondary | ICD-10-CM | POA: Insufficient documentation

## 2020-09-21 DIAGNOSIS — I639 Cerebral infarction, unspecified: Secondary | ICD-10-CM | POA: Insufficient documentation

## 2020-09-21 NOTE — Progress Notes (Signed)
This patient returns to my office for at risk foot care.  This patient requires this care by a professional since this patient will be at risk due to having acute kidney disease ,DM and CKD. He presents to the office with his wife.  This patient is unable to cut nails himself since the patient cannot reach his nails.These nails are painful walking and wearing shoes.  This patient presents for at risk foot care today.  General Appearance  Alert, conversant and in no acute stress.  Vascular  Dorsalis pedis and posterior tibial  pulses are palpable  bilaterally.  Capillary return is within normal limits  bilaterally. Temperature is within normal limits  bilaterally.  Neurologic  Senn-Weinstein monofilament wire test diminished   bilaterally. Muscle power within normal limits bilaterally.  Nails Thick disfigured discolored nails with subungual debris  from hallux to fifth toes bilaterally. No evidence of bacterial infection or drainage bilaterally.  Orthopedic  No limitations of motion  feet .  No crepitus or effusions noted.  HAV  B/L.  Hammer toes 2-5  B/L.  Skin  normotropic skin with no porokeratosis noted bilaterally.  No signs of infections or ulcers noted.     Onychomycosis  Pain in right toes  Pain in left toes  Consent was obtained for treatment procedures.   Mechanical debridement of nails 1-5  bilaterally performed with a nail nipper.  Filed with dremel without incident.    Return office visit   3 months                   Told patient to return for periodic foot care and evaluation due to potential at risk complications.   Teryl Gubler DPM  

## 2020-09-27 DIAGNOSIS — E119 Type 2 diabetes mellitus without complications: Secondary | ICD-10-CM | POA: Diagnosis not present

## 2020-09-29 DIAGNOSIS — E118 Type 2 diabetes mellitus with unspecified complications: Secondary | ICD-10-CM | POA: Diagnosis not present

## 2020-09-29 DIAGNOSIS — E1122 Type 2 diabetes mellitus with diabetic chronic kidney disease: Secondary | ICD-10-CM | POA: Diagnosis not present

## 2020-09-29 DIAGNOSIS — E782 Mixed hyperlipidemia: Secondary | ICD-10-CM | POA: Diagnosis not present

## 2020-09-29 DIAGNOSIS — K219 Gastro-esophageal reflux disease without esophagitis: Secondary | ICD-10-CM | POA: Diagnosis not present

## 2020-09-29 DIAGNOSIS — N183 Chronic kidney disease, stage 3 unspecified: Secondary | ICD-10-CM | POA: Diagnosis not present

## 2020-09-29 DIAGNOSIS — I10 Essential (primary) hypertension: Secondary | ICD-10-CM | POA: Diagnosis not present

## 2020-09-29 DIAGNOSIS — I639 Cerebral infarction, unspecified: Secondary | ICD-10-CM | POA: Diagnosis not present

## 2020-11-03 DIAGNOSIS — K219 Gastro-esophageal reflux disease without esophagitis: Secondary | ICD-10-CM | POA: Diagnosis not present

## 2020-11-03 DIAGNOSIS — I639 Cerebral infarction, unspecified: Secondary | ICD-10-CM | POA: Diagnosis not present

## 2020-11-03 DIAGNOSIS — N183 Chronic kidney disease, stage 3 unspecified: Secondary | ICD-10-CM | POA: Diagnosis not present

## 2020-11-03 DIAGNOSIS — E118 Type 2 diabetes mellitus with unspecified complications: Secondary | ICD-10-CM | POA: Diagnosis not present

## 2020-11-03 DIAGNOSIS — E782 Mixed hyperlipidemia: Secondary | ICD-10-CM | POA: Diagnosis not present

## 2020-11-03 DIAGNOSIS — I10 Essential (primary) hypertension: Secondary | ICD-10-CM | POA: Diagnosis not present

## 2020-11-03 DIAGNOSIS — E1122 Type 2 diabetes mellitus with diabetic chronic kidney disease: Secondary | ICD-10-CM | POA: Diagnosis not present

## 2020-11-10 ENCOUNTER — Telehealth: Payer: Self-pay | Admitting: Adult Health

## 2020-11-10 NOTE — Telephone Encounter (Signed)
Wife(on DPR) has called to report that Pt's cognitive state is worsening and pt becoming very agitated easily, wife asking for a call to discuss

## 2020-11-10 NOTE — Telephone Encounter (Signed)
Called wife who stated his cognitive condition is declining; he is getting paranoid, angry. We reschedule his FU for next Mon. She understands to arrive 30 minutes early, bring new insurance cards, verbalized understanding, appreciation.

## 2020-11-14 ENCOUNTER — Ambulatory Visit: Payer: Medicare Other | Admitting: Adult Health

## 2020-11-14 ENCOUNTER — Encounter: Payer: Self-pay | Admitting: Adult Health

## 2020-11-14 ENCOUNTER — Other Ambulatory Visit: Payer: Self-pay

## 2020-11-14 VITALS — BP 163/83 | HR 54 | Ht 72.0 in | Wt 196.0 lb

## 2020-11-14 DIAGNOSIS — R4189 Other symptoms and signs involving cognitive functions and awareness: Secondary | ICD-10-CM

## 2020-11-14 DIAGNOSIS — E785 Hyperlipidemia, unspecified: Secondary | ICD-10-CM | POA: Diagnosis not present

## 2020-11-14 DIAGNOSIS — Z8673 Personal history of transient ischemic attack (TIA), and cerebral infarction without residual deficits: Secondary | ICD-10-CM | POA: Diagnosis not present

## 2020-11-14 DIAGNOSIS — E119 Type 2 diabetes mellitus without complications: Secondary | ICD-10-CM | POA: Diagnosis not present

## 2020-11-14 NOTE — Progress Notes (Signed)
I agree with the above plan 

## 2020-11-14 NOTE — Progress Notes (Addendum)
[facility] [address] [place]. [place] [address] [phone]       OFFICE FOLLOW-UP NOTE  [name] [name] Date of Birth:  1945-11-07 Medical Record Number:  [id number]    Chief Complaint  Patient presents with  . Follow-up    TR with wife (louise) Pt states things could be better, wife states he some days are worse than others, seems as if he has dementia. Easily agitated, personality changes, forgetful and sundowners       HPI:   Today, 11/14/2020, [name] [name] is being seen for acute visit by request of wife due to worsening cognition and behavioral changes.  Majority of history and information obtained by wife.  Cognition has been slowly declining since prior visit approximately 4 months ago.  Denies any recent or acute changes He has been having difficulty with short-term memory, processing of information and confusion which can worsen in the evening time.  Becomes agitated quickly and will curse which is not like him per wife.  Denies aggressiveness, hallucinations, paranoia or delusions Sleeps well at night although frequent nocturia.  Denies snoring or daytime fatigue Admits to sedentary lifestyle with no routine physical activity but will accompany wife to visits with family, grocery store, going out for breakfast and doctor's appointments Previously working with SLP but since completed and does not do exercises as recommended or any type of memory exercises Able to maintain ADLs with only some assistance by wife due to balanice difficulties. He does not do any type of cleaning, cooking or bill paying.  He does not drive.  Wife is his primary caregiver and provides 24/7 supervision  Compliant on aspirin and Crestor -denies associated side effects.  No recent lipid panel Blood pressure today 163/83 on carvedilol, amlodipine, and hydralazineper PCP.  Similar reading on recheck  No further concerns at this time     MMSE - Mini Mental State Exam  11/14/2020  Orientation to time 2  Orientation to Place 5  Registration 3  Attention/ Calculation 0  Recall 0  Language- name 2 objects 2  Language- repeat 1  Language- follow 3 step command 3  Language- read & follow direction 1  Write a sentence 1  Copy design 1  Total score 19   Depression screen [facility] 2/9 11/14/2020 11/24/2019  Decreased Interest 1 2  Down, Depressed, Hopeless 3 1  PHQ - 2 Score 4 3  Altered sleeping 2 0  Tired, decreased energy 3 1  Change in appetite 2 0  Feeling bad or failure about yourself  2 1  Trouble concentrating 2 2  Moving slowly or fidgety/restless 3 1  Suicidal thoughts 0 0  PHQ-9 Score 18 8   GAD 7 : Generalized Anxiety Score 11/14/2020  Nervous, Anxious, on Edge 0  Control/stop worrying 0  Worry too much - different things 0  Trouble relaxing 0  Restless 0  Easily annoyed or irritable 2  Afraid - awful might happen 0  Total GAD 7 Score 2        History provided for reference purposes only Update 07/11/2020 JM: [name] [name] is being seen for 39-month stroke follow-up accompanied by his wife.  Reports residual occasional imbalance and cognitive impairment with improvement since prior visit.  He has since completed therapies but continues to do exercises at home as recommended.  He questions potential return to driving.  Denies new stroke/TIA symptoms.  Remains on aspirin and Crestor for secondary stroke prevention of side effects.  Blood  pressure today 176/98 and similar on recheck.   Occasionally monitors at home typically SBP 150s per wife.  No further concerns at this time.  Initial visit 01/07/2020 Dr. Leonie Man: [name] [name] is a 75 year old pleasant African-American male seen today for initial office follow-up visit following hospital consultation for stroke in February 2021.  Is accompanied by his wife.  History is obtained from them, review of electronic medical records and I personally reviewed imaging films in PACS.  He has past medical history  of hyperlipidemia, hypertension, presented to [facility] with back pain and epigastric pain on 10/08/2019 and was found to have distal thoracic aortic aneurysm and small bilateral pulmonary embolisms.  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  He developed altered mental status and weakness in 10/17/2019 and CT scan showed hypodensities in right cerebellum and right occipital lobe suspicious for infarcts.  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  MRA of the head showed chronic moderate left P1 stenosis.  Carotid Dopplers were unremarkable.  2D echo showed normal ejection fraction.  LDL cholesterol was 72 mg percent hemoglobin A1c was 6.7.  Patient was initially started on IV heparin and was planned for being discharged on Eliquis and went to inpatient rehab.  However his medication list today states that he is on aspirin 81 mg only and patient is unable to tell me the reason for this.  He is currently getting home physical and occupational therapy which she has just finished but outpatient therapy has not yet started.  He walks with a wheeled walker but feels his balance is off and tends to fall backwards and has poor truncal balance.  He can walk with a walker short distances well.  He has had no falls or injuries.  He just had follow-up visit with Dr. Letta Pate today.  He does complain of mild memory difficulties and cognitive impairment since his stroke.  This is not progressive.   ROS:   14 system review of systems is positive for those listed in HPI and all other systems negative PMH:  Past Medical History:  Diagnosis Date  . Chronic back pain    stenosis  . Enlarged prostate   . GERD (gastroesophageal reflux disease)    occasionally will take a zantac(maybe once a month)  . History of colon polyps   . History of kidney stones   . History of stress test    done 10 yrs. ago, as a baseline   . Hyperlipidemia    takes Crestor daily   . Hypertension    takes Amlodipine daily and Lotensin as well  . Stroke [facility])     Social History:  Social History   Socioeconomic History  . Marital status: Married    Spouse name: Not on file  . Number of children: Not on file  . Years of education: Not on file  . Highest education level: Not on file  Occupational History  . Not on file  Tobacco Use  . Smoking status: Former Smoker    Packs/day: 0.25    Years: 40.00    Pack years: 10.00    Types: Cigarettes  . Smokeless tobacco: Never Used  . Tobacco comment: none since 06/24/13  Vaping Use  . Vaping Use: Never used  Substance and Sexual Activity  . Alcohol use: No  . Drug use: No  . Sexual activity: Not on file  Other Topics Concern  . Not on file  Social History Narrative  .  Not on file   Social Determinants of Health   Financial Resource Strain: Not on file  Food Insecurity: Not on file  Transportation Needs: Not on file  Physical Activity: Not on file  Stress: Not on file  Social Connections: Not on file  Intimate Partner Violence: Not on file    Medications:   Current Outpatient Medications on File Prior to Visit  Medication Sig Dispense Refill  . acetaminophen (TYLENOL) 500 MG tablet Take 1,000 mg by mouth every 6 (six) hours as needed for headache (pain).    Marland Kitchen acyclovir (ZOVIRAX) 400 MG tablet 1 tablet    . amLODipine (NORVASC) 2.5 MG tablet Take 2.5 mg by mouth daily.     . Ascorbic Acid (VITAMIN C) 500 MG CAPS Take 500 mg by mouth daily.     Marland Kitchen aspirin EC 81 MG tablet Take 81 mg by mouth daily.    . Calcium Carbonate-Vitamin D (CALCIUM-D PO) Take 1 tablet by mouth daily.    . carvedilol (COREG) 25 MG tablet Take 1 tablet (25 mg total) by mouth 2 (two) times daily with a meal. 60 tablet 0  . Cyanocobalamin (VITAMIN B-12 PO) Take 1 tablet by mouth daily.    . hydrALAZINE (APRESOLINE) 10 MG tablet Take 1 tablet (10 mg total) by mouth every 6 (six) hours. 180 tablet 0  . magnesium citrate SOLN Take 1  Bottle by mouth daily as needed for severe constipation.     . metFORMIN (GLUCOPHAGE) 500 MG tablet Take 500 mg by mouth 2 (two) times daily.    . Multiple Vitamin (MULTIVITAMIN WITH MINERALS) TABS tablet Take 1 tablet by mouth daily.    . Multiple Vitamins-Minerals (MULTI VITAMIN/MINERALS) TABS See admin instructions.    . pantoprazole (PROTONIX) 40 MG tablet Take 1 tablet (40 mg total) by mouth daily. 30 tablet 0  . rosuvastatin (CRESTOR) 10 MG tablet Take 1 tablet (10 mg total) by mouth daily. 30 tablet 0  . sildenafil (VIAGRA) 100 MG tablet 1 tablet as needed    . tamsulosin (FLOMAX) 0.4 MG CAPS capsule Take 1 capsule (0.4 mg total) by mouth daily. 30 capsule 0  . Vitamin D, Cholecalciferol, 10 MCG (400 UNIT) CHEW 1 tablet     No current facility-administered medications on file prior to visit.    Allergies:   Allergies  Allergen Reactions  . Lyrica [Pregabalin] Nausea Only and Other (See Comments)    Hallucinations and dizziness, also  . Sulfa Antibiotics Other (See Comments)    Unknown reaction    Physical Exam Today's Vitals   11/14/20 0933  BP: (!) 163/83  Pulse: (!) 54  Weight: 196 lb (88.9 kg)  Height: 6' (1.829 m)   Body mass index is 26.58 kg/m.   General: well developed, well nourished pleasant elderly African-American male, seated, in no evident distress Head: head normocephalic and atraumatic.  Neck: supple with no carotid or supraclavicular bruits Cardiovascular: regular rate and rhythm, no murmurs Musculoskeletal: no deformity Skin:  no rash/petichiae Vascular:  Normal pulses all extremities  Neurologic Exam Mental Status: Awake and fully alert.  Slowed speech but no evidence of aphasia or dysarthria.  Oriented to place but disoriented to time. Recent and remote memory diminished. attention span, concentration and fund of knowledge diminished. Mood and affect appropriate. MMSE today 19/30 with greater deficit attention and calculation, recall and drawing.   Cranial Nerves: Pupils equal, briskly reactive to light. Extraocular movements full without nystagmus. Visual fields full to confrontation. Hearing intact. Facial  sensation intact. Face, tongue, palate moves normally and symmetrically.  Motor: Normal bulk and tone. Normal strength in all tested extremity muscles. Sensory.: intact to touch ,pinprick .position and vibratory sensation.  Coordination: Rapid alternating movements normal in all extremities. Finger-to-nose and heel-to-shin performed accurately bilaterally. Gait and Station: Arises from chair without difficulty. Stance is broad-based gait demonstrates mild ataxia and mild imbalance with use of rolling walker.  Unable to perform tandem walk, and heel toe  reflexes: 1+ and symmetric. Toes downgoing.       ASSESSMENT/PLAN: 75 year old African-American male with multiple by cerebral infarcts in February 2021 s/p aortic graft surgery for aortic dissection with residual mild cognitive and gait impairment.    1. CC: Worsening cognition and behavioral changes a. 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  Also concern of underlying depression potentially contributing which was deferred to PCP to further discuss initiating medications or benefit with psychology b. Obtain MR brain and EEG - order for MRI will be placed after results of renal function obtained - if WNL, will recommend  MRI w/wo contrast but if abnormal, will proceed imaging wo contrast i. ADDENDUM: Renal function WNL -order placed for MRI brain w/wo contrast c. Obtain dementia panel, CBC and CMP to rule out reversible causes d. Will also obtain lipid panel and A1c  e. discussed importance of managing stroke risk factors and increase physical activity as well as memory exercises to help prevent continued decline. If all work up unremarkable, may consider use  of Namenda or Aricept.  Additional information provided 2. Multiple small brain infarcts:  a. Continue aspirin 81 mg daily  and Crestor for secondary stroke prevention.   b. Discussed secondary stroke prevention measures and importance of close PCP f/u for aggressive stroke risk factor management  3. HTN: BP goal <130/90.  Uncontrolled today on hydralazine, carvedilol and amlodipine per PCP -advised to follow-up with PCP for further discussion 4. HLD: LDL goal <70. On Crestor 10 mg daily per PCP -repeat lipid panel 5. 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  On Metformin per PCP -repeat A1c    Follow-up in 3 months or call earlier if needed  CC:  [place] provider: Dr. Cleatrice Burke, [place] Ar, MD    I spent 45 minutes of face-to-face and non-face-to-face time with patient and wife.  This included previsit chart review, lab review, study review, order entry, electronic health record documentation, patient education and discussion regarding cognitive decline and further evaluation with possible etiologies, completion and review of MMSE, PHQ 2/9 and GAD 7, history of prior stroke, residual deficits, importance of managing stroke risk factors and answered all other questions to patients and wifes satisfaction   Frann Rider, AGNP-BC  [facility] [address] [place], [place] [address]  Phone [phone] Fax [phone] Note: This document was prepared with digital dictation and possible smart phrase technology. Any transcriptional errors that result from this process are unintentional.

## 2020-11-14 NOTE — Telephone Encounter (Signed)
Can you please contact patient's wife to see if he has been seen by PCP and if so, can you please have their office send any recent lab work or office notes prior to today's visit. Thank you

## 2020-11-14 NOTE — Patient Instructions (Signed)
Your Plan:  We will obtain lab work today to assess for reversible causes of cognitive decline  Obtain EEG to rule out potential seizures  Obtain brain imaging to rule out new stroke or underlying causes  Please follow-up with your PCP in regards to underlying depression which could be contributing to symptoms     Follow-up in 4 months or call earlier if needed     Thank you for coming to see [place] at [facility]. I hope we have been able to provide you high quality care today.  You may receive a patient satisfaction survey over the next few weeks. We would appreciate your feedback and comments so that we may continue to improve ourselves and the health of our patients.    Vascular Dementia Dementia is a condition in which a [place] has problems with thinking, memory, and behavior that are severe enough to interfere with daily life. Vascular dementia is a type of dementia. It results from brain damage that is caused by the brain not getting enough blood. This condition may also be called vascular cognitive impairment. What are the causes? Vascular dementia is caused by conditions that reduce blood flow to the brain. Common causes of this condition include:  Multiple small strokes. These may happen without symptoms (silent stroke).  Major stroke.  Damage to small blood vessels in the brain (cerebral small vessel disease). What increases the risk? The following factors may make you more likely to develop this condition:  Having had a stroke.  Having high blood pressure (hypertension) or high cholesterol.  Having a disease that affects the heart or blood vessels.  Smoking.  Not being active.  Being over age 30.  Having any of these conditions: ? Diabetes. ? Metabolic syndrome. ? Obesity. ? Depression. ? A genetic condition that leads to stroke, such as CADASIL (cerebral autosomal dominant arteriopathy with subcortical infarcts and  leukoencephalopathy). What are the signs or symptoms? Symptoms of vascular dementia can vary from one Hohn to another. Symptoms may be mild or severe depending on the amount of damage and which parts of the brain have been affected. Symptoms may begin suddenly or may develop slowly. Mental symptoms may include:  Confusion and memory problems.  Poor attention and concentration.  Trouble understanding speech.  Depression.  Personality changes.  Trouble recognizing familiar people.  Agitation or aggression.  Paranoia or false beliefs (delusions).  Hallucinations. These involve seeing, hearing, tasting, smelling, or feeling things that are not real. Physical symptoms may include:  Weakness.  Poor balance.  Loss of bladder or bowel control (incontinence).  Unsteady walking (gait).  Speaking problems. Behavioral symptoms may include:  Getting lost in familiar places.  Problems with planning and judgment.  Trouble following instructions.  Social problems.  Emotional outbursts.  Trouble with daily activities and self-care.  Problems handling money. Symptoms may remain stable, or they may get worse over time. Symptoms of vascular dementia may be similar to those of Alzheimer's disease. The two conditions can occur together (mixed dementia). How is this diagnosed? This condition may be diagnosed based on:  Your medical history and a physical exam.  Symptoms or changes that are reported by friends and family.  Lab tests or other tests that check brain and nervous system function. Tests may include: ? Blood tests. ? Brain imaging tests. ? Tests of movement, speech, and other daily activities (neurological exam). ? Tests of memory, thinking, and problem-solving (neuropsychological or neurocognitive testing). There is not a specific test to  diagnose vascular dementia. Diagnosis may involve several specialists. These may include:  A health care provider who  specializes in the brain and nervous system (neurologist).  A health care provider who specializes in understanding how problems in the brain can alter behavior and cognitive function (neuropsychologist). How is this treated? There is no cure for vascular dementia. Brain damage that has already occurred cannot be reversed. Treatment depends on:  How severe the condition is.  Which parts of your brain have been affected.  Your overall health. Treatment measures aim to:  Treat the underlying cause of vascular dementia and manage risk factors. This may include: ? Controlling blood pressure or lowering cholesterol. ? Treating diabetes. ? Making lifestyle changes, such as quitting smoking or losing weight.  Manage symptoms.  Prevent further brain damage.  Improve the Muse's health and quality of life. Treatment for dementia may involve a team of health care providers, including:  A neurologist.  A provider who specializes in disorders of the mind (psychiatrist).  A provider who specializes in helping people learn daily living skills (occupational therapist).  A provider who focuses on speech and language changes (Electrical engineer).  A heart specialist (cardiologist).  A provider who helps people learn how to manage physical changes, such as movement and walking (exercise physiologist or physical therapist). Follow these instructions at home: Medicines  Take over-the-counter and prescription medicines only as told by your health care provider.  Use a pill organizer or pill reminder to help you manage your medicines. Lifestyle  Do not use any products that contain nicotine or tobacco, such as cigarettes, e-cigarettes, and chewing tobacco. If you need help quitting, ask your health care provider.  Eat a healthy, balanced diet.  Maintain a healthy weight, or lose weight if needed.  Be physically active as told by your health care provider. General instructions  Work  with your health care provider to determine what you need help with and what your safety needs are.  Follow the health care provider's instructions for treating the condition that caused the dementia.  Keep all follow-up visits. This is important. If you are the caregiver: People with vascular dementia may need regular help at home or daily care from a family member or home health care worker. Home care for a [place] with vascular dementia depends on what caused the condition and how severe the symptoms are. General guidelines for caregivers include:  Help the [place] with dementia remember people, appointments, and daily activities.  Help the Bellard with dementia manage his or her medicines.  Help family and friends learn about ways to communicate with the [place] with dementia.  Create a safe living space to reduce the risk of injury or falls.  Find a support group to help caregivers and family cope with the effects of dementia.   Where to find more information  [employer] of Neurological Disorders and Stroke: MasterBoxes.it Contact a health care provider if:  New behavioral problems develop.  Problems with swallowing develop.  Confusion gets worse.  Sleepiness gets worse. Get help right away if:  Loss of consciousness occurs.  There is a sudden loss of speech, balance, or thinking ability.  New numbness or paralysis occurs.  Sudden, severe headache occurs.  Vision is lost or suddenly gets worse in one or both eyes. If you ever feel like the Kov[place] with dementia may hurt himself or herself or others, or if he or she shares thoughts about taking his or her own life, get help right  away. You can go to your nearest emergency department or:  Call your local emergency services (911 in the [place]  Call a suicide crisis helpline, such as the [place] at 725-779-4262. This is open 24 hours a day in the [place]  Text the Crisis Text Line at  (873) 866-9977 (in the [place].). Summary  Vascular dementia is a type of dementia. It results from brain damage that is caused by the brain not getting enough blood.  Vascular dementia is caused by conditions that reduce blood flow to the brain. Common causes of this condition include stroke and damage to small blood vessels in the brain.  Treatment focuses on treating the underlying cause of vascular dementia and managing any risk factors.  People with vascular dementia may need regular help at home or daily care from a family member or home health care worker.  Contact a health care provider if you or your caregiver notices any new symptoms. This information is not intended to replace advice given to you by your health care provider. Make sure you discuss any questions you have with your health care provider. Document Revised: 12/21/2019 Document Reviewed: 12/21/2019 Elsevier Patient Education  [employer].

## 2020-11-14 NOTE — Telephone Encounter (Signed)
Has he been seen by his PCP to rule out any underlying infection contributing to symptoms?

## 2020-11-14 NOTE — Telephone Encounter (Signed)
I called wife and she is here with pt.

## 2020-11-15 LAB — CBC WITH DIFFERENTIAL/PLATELET
Basophils Absolute: 0.1 10*3/uL (ref 0.0–0.2)
Basos: 1 %
EOS (ABSOLUTE): 0.3 10*3/uL (ref 0.0–0.4)
Eos: 5 %
Hematocrit: 39.1 % (ref 37.5–51.0)
Hemoglobin: 12.7 g/dL — ABNORMAL LOW (ref 13.0–17.7)
Immature Grans (Abs): 0 10*3/uL (ref 0.0–0.1)
Immature Granulocytes: 0 %
Lymphocytes Absolute: 1 10*3/uL (ref 0.7–3.1)
Lymphs: 15 %
MCH: 29.1 pg (ref 26.6–33.0)
MCHC: 32.5 g/dL (ref 31.5–35.7)
MCV: 90 fL (ref 79–97)
Monocytes Absolute: 0.5 10*3/uL (ref 0.1–0.9)
Monocytes: 8 %
Neutrophils Absolute: 4.9 10*3/uL (ref 1.4–7.0)
Neutrophils: 71 %
Platelets: 157 10*3/uL (ref 150–450)
RBC: 4.36 x10E6/uL (ref 4.14–5.80)
RDW: 15.6 % — ABNORMAL HIGH (ref 11.6–15.4)
WBC: 6.9 10*3/uL (ref 3.4–10.8)

## 2020-11-15 LAB — DEMENTIA PANEL
Homocysteine: 6.5 umol/L (ref 0.0–19.2)
RPR Ser Ql: NONREACTIVE
TSH: 1.57 u[IU]/mL (ref 0.450–4.500)
Vitamin B-12: >2000 pg/mL — ABNORMAL HIGH (ref 232–1245)

## 2020-11-15 LAB — COMPREHENSIVE METABOLIC PANEL
ALT: 19 IU/L (ref 0–44)
AST: 22 IU/L (ref 0–40)
Albumin/Globulin Ratio: 1.7 (ref 1.2–2.2)
Albumin: 4.5 g/dL (ref 3.7–4.7)
Alkaline Phosphatase: 80 IU/L (ref 44–121)
BUN/Creatinine Ratio: 10 (ref 10–24)
BUN: 12 mg/dL (ref 8–27)
Bilirubin Total: 0.7 mg/dL (ref 0.0–1.2)
CO2: 24 mmol/L (ref 20–29)
Calcium: 9.8 mg/dL (ref 8.6–10.2)
Chloride: 105 mmol/L (ref 96–106)
Creatinine, Ser: 1.17 mg/dL (ref 0.76–1.27)
Globulin, Total: 2.6 g/dL (ref 1.5–4.5)
Glucose: 107 mg/dL — ABNORMAL HIGH (ref 65–99)
Potassium: 4.3 mmol/L (ref 3.5–5.2)
Sodium: 145 mmol/L — ABNORMAL HIGH (ref 134–144)
Total Protein: 7.1 g/dL (ref 6.0–8.5)
eGFR: 65 mL/min/{1.73_m2} (ref >59)

## 2020-11-15 LAB — HEMOGLOBIN A1C
Est. average glucose Bld gHb Est-mCnc: 123 mg/dL
Hgb A1c MFr Bld: 5.9 % — ABNORMAL HIGH (ref 4.8–5.6)

## 2020-11-15 LAB — LIPID PANEL
Chol/HDL Ratio: 2.7 ratio (ref 0.0–5.0)
Cholesterol, Total: 119 mg/dL (ref 100–199)
HDL: 44 mg/dL (ref >39)
LDL Chol Calc (NIH): 56 mg/dL (ref 0–99)
Triglycerides: 105 mg/dL (ref 0–149)
VLDL Cholesterol Cal: 19 mg/dL (ref 5–40)

## 2020-11-15 NOTE — Addendum Note (Signed)
Addended by: Mal Misty on: 11/15/2020 04:04 PM   Modules accepted: Orders

## 2020-11-16 ENCOUNTER — Telehealth: Payer: Self-pay | Admitting: Adult Health

## 2020-11-16 NOTE — Telephone Encounter (Signed)
[employer] medicare order sent to [facility]. No auth they will reach out to the patient to schedule.  

## 2020-11-17 ENCOUNTER — Ambulatory Visit: Payer: Self-pay | Admitting: Adult Health

## 2020-11-21 DIAGNOSIS — H50132 Monocular exotropia with V pattern, left eye: Secondary | ICD-10-CM | POA: Diagnosis not present

## 2020-11-25 DIAGNOSIS — E1122 Type 2 diabetes mellitus with diabetic chronic kidney disease: Secondary | ICD-10-CM | POA: Diagnosis not present

## 2020-11-25 DIAGNOSIS — K219 Gastro-esophageal reflux disease without esophagitis: Secondary | ICD-10-CM | POA: Diagnosis not present

## 2020-11-25 DIAGNOSIS — E118 Type 2 diabetes mellitus with unspecified complications: Secondary | ICD-10-CM | POA: Diagnosis not present

## 2020-11-25 DIAGNOSIS — N183 Chronic kidney disease, stage 3 unspecified: Secondary | ICD-10-CM | POA: Diagnosis not present

## 2020-11-25 DIAGNOSIS — I10 Essential (primary) hypertension: Secondary | ICD-10-CM | POA: Diagnosis not present

## 2020-11-25 DIAGNOSIS — E782 Mixed hyperlipidemia: Secondary | ICD-10-CM | POA: Diagnosis not present

## 2020-11-30 ENCOUNTER — Ambulatory Visit
Admission: RE | Admit: 2020-11-30 | Discharge: 2020-11-30 | Disposition: A | Discharge status: Home / Self Care | Payer: Medicare Other | Source: Ambulatory Visit | Attending: Adult Health | Admitting: Adult Health

## 2020-11-30 ENCOUNTER — Other Ambulatory Visit: Payer: Self-pay

## 2020-11-30 DIAGNOSIS — R4189 Other symptoms and signs involving cognitive functions and awareness: Secondary | ICD-10-CM | POA: Diagnosis not present

## 2020-11-30 DIAGNOSIS — R413 Other amnesia: Secondary | ICD-10-CM | POA: Diagnosis not present

## 2020-11-30 DIAGNOSIS — Z8673 Personal history of transient ischemic attack (TIA), and cerebral infarction without residual deficits: Secondary | ICD-10-CM | POA: Diagnosis not present

## 2020-11-30 MED ORDER — GADOBENATE DIMEGLUMINE 529 MG/ML IV SOLN
15.0000 mL | Freq: Once | INTRAVENOUS | Status: AC | PRN
  Administered 2020-11-30: 15 mL via INTRAVENOUS

## 2020-12-05 ENCOUNTER — Telehealth: Payer: Self-pay

## 2020-12-05 NOTE — Telephone Encounter (Signed)
Contacted pt and spoke to him and wife, per DPR, to inform him that recent imaging did not show any new or concerning findings in regards to his cognitive decline. Advised them to call the office with further questions, they understood.

## 2020-12-21 ENCOUNTER — Encounter: Payer: Self-pay | Admitting: Podiatry

## 2020-12-21 ENCOUNTER — Ambulatory Visit: Payer: Medicare Other | Admitting: Podiatry

## 2020-12-21 ENCOUNTER — Other Ambulatory Visit: Payer: Self-pay

## 2020-12-21 DIAGNOSIS — M21611 Bunion of right foot: Secondary | ICD-10-CM

## 2020-12-21 DIAGNOSIS — E1140 Type 2 diabetes mellitus with diabetic neuropathy, unspecified: Secondary | ICD-10-CM | POA: Diagnosis not present

## 2020-12-21 DIAGNOSIS — N183 Chronic kidney disease, stage 3 unspecified: Secondary | ICD-10-CM | POA: Diagnosis not present

## 2020-12-21 DIAGNOSIS — M79674 Pain in right toe(s): Secondary | ICD-10-CM

## 2020-12-21 DIAGNOSIS — M2011 Hallux valgus (acquired), right foot: Secondary | ICD-10-CM | POA: Diagnosis not present

## 2020-12-21 DIAGNOSIS — M21612 Bunion of left foot: Secondary | ICD-10-CM

## 2020-12-21 DIAGNOSIS — B351 Tinea unguium: Secondary | ICD-10-CM | POA: Diagnosis not present

## 2020-12-21 DIAGNOSIS — M79675 Pain in left toe(s): Secondary | ICD-10-CM

## 2020-12-21 DIAGNOSIS — M2012 Hallux valgus (acquired), left foot: Secondary | ICD-10-CM | POA: Diagnosis not present

## 2020-12-21 NOTE — Progress Notes (Signed)
This patient returns to my office for at risk foot care.  This patient requires this care by a professional since this patient will be at risk due to having acute kidney disease ,DM and CKD. He presents to the office with his wife.  This patient is unable to cut nails himself since the patient cannot reach his nails.These nails are painful walking and wearing shoes.  This patient presents for at risk foot care today.  General Appearance  Alert, conversant and in no acute stress.  Vascular  Dorsalis pedis and posterior tibial  pulses are palpable  bilaterally.  Capillary return is within normal limits  bilaterally. Temperature is within normal limits  bilaterally.  Neurologic  Senn-Weinstein monofilament wire test diminished   bilaterally. Muscle power within normal limits bilaterally.  Nails Thick disfigured discolored nails with subungual debris  from hallux to fifth toes bilaterally. No evidence of bacterial infection or drainage bilaterally.  Orthopedic  No limitations of motion  feet .  No crepitus or effusions noted.  HAV  B/L.  Hammer toes 2-5  B/L.  Skin  normotropic skin with no porokeratosis noted bilaterally.  No signs of infections or ulcers noted.     Onychomycosis  Pain in right toes  Pain in left toes  Consent was obtained for treatment procedures.   Mechanical debridement of nails 1-5  bilaterally performed with a nail nipper.  Filed with dremel without incident.    Return office visit   3 months                   Told patient to return for periodic foot care and evaluation due to potential at risk complications.   Aristotle Lieb DPM  

## 2020-12-28 DIAGNOSIS — I10 Essential (primary) hypertension: Secondary | ICD-10-CM | POA: Diagnosis not present

## 2020-12-28 DIAGNOSIS — E782 Mixed hyperlipidemia: Secondary | ICD-10-CM | POA: Diagnosis not present

## 2020-12-28 DIAGNOSIS — K219 Gastro-esophageal reflux disease without esophagitis: Secondary | ICD-10-CM | POA: Diagnosis not present

## 2020-12-28 DIAGNOSIS — N183 Chronic kidney disease, stage 3 unspecified: Secondary | ICD-10-CM | POA: Diagnosis not present

## 2020-12-28 DIAGNOSIS — I639 Cerebral infarction, unspecified: Secondary | ICD-10-CM | POA: Diagnosis not present

## 2020-12-28 DIAGNOSIS — E1122 Type 2 diabetes mellitus with diabetic chronic kidney disease: Secondary | ICD-10-CM | POA: Diagnosis not present

## 2020-12-28 DIAGNOSIS — E118 Type 2 diabetes mellitus with unspecified complications: Secondary | ICD-10-CM | POA: Diagnosis not present

## 2021-01-09 ENCOUNTER — Ambulatory Visit: Payer: Medicare Other | Admitting: Adult Health

## 2021-01-11 ENCOUNTER — Encounter: Payer: Self-pay | Admitting: Adult Health

## 2021-01-11 ENCOUNTER — Ambulatory Visit: Payer: Medicare Other | Admitting: Adult Health

## 2021-01-11 VITALS — BP 142/93 | HR 57 | Ht 72.0 in | Wt 194.0 lb

## 2021-01-11 DIAGNOSIS — E785 Hyperlipidemia, unspecified: Secondary | ICD-10-CM | POA: Diagnosis not present

## 2021-01-11 DIAGNOSIS — E119 Type 2 diabetes mellitus without complications: Secondary | ICD-10-CM | POA: Diagnosis not present

## 2021-01-11 DIAGNOSIS — Z8673 Personal history of transient ischemic attack (TIA), and cerebral infarction without residual deficits: Secondary | ICD-10-CM

## 2021-01-11 DIAGNOSIS — F0150 Vascular dementia without behavioral disturbance: Secondary | ICD-10-CM | POA: Diagnosis not present

## 2021-01-11 DIAGNOSIS — I10 Essential (primary) hypertension: Secondary | ICD-10-CM

## 2021-01-11 MED ORDER — MEMANTINE HCL 10 MG PO TABS: 10.0000 mg | ORAL_TABLET | Freq: Two times a day (BID) | ORAL | 5 refills | Status: DC

## 2021-01-11 MED ORDER — MEMANTINE HCL 28 X 5 MG & 21 X 10 MG PO TABS: ORAL_TABLET | ORAL | 0 refills | Status: DC

## 2021-01-11 NOTE — Patient Instructions (Addendum)
Your Plan:  Recommend starting Namenda for cognitive impairment - this will be started by a titration pack for the first 30 days and then will be at goal of taking 10mg  twice daily -please monitor for any potential side effects  Very important to do memory exercises (as provided), routine physical activity and exercise, healthy diet, adequate sleep and managing of stroke risk factors to help prevent or slow further decline   Continue current treatment regimen for stroke prevention with routine follow up with PCP     Follow-up in 6 months or call earlier if needed     Thank you for coming to see [name] at [facility]. I hope we have been able to provide you high quality care today.  You may receive a patient satisfaction survey over the next few weeks. We would appreciate your feedback and comments so that we may continue to improve ourselves and the health of our patients.   Memantine (Namenda) Tablets What is this medicine? MEMANTINE (MEM an teen) is used to treat dementia caused by Alzheimer's disease. This medicine may be used for other purposes; ask your health care provider or pharmacist if you have questions. COMMON BRAND NAME(S): Namenda What should I tell my health care provider before I take this medicine? They need to know if you have any of these conditions:  difficulty passing urine  kidney disease  liver disease  seizures  an unusual or allergic reaction to memantine, other medicines, foods, dyes, or preservatives  pregnant or trying to get pregnant  breast-feeding How should I use this medicine? Take this medicine by mouth with a glass of water. Follow the directions on the prescription label. You may take this medicine with or without food. Take your doses at regular intervals. Do not take your medicine more often than directed. Continue to take your medicine even if you feel better. Do not stop taking except on the advice of your doctor or  health care professional. Talk to your pediatrician regarding the use of this medicine in children. Special care may be needed. Overdosage: If you think you have taken too much of this medicine contact a poison control center or emergency room at once. NOTE: This medicine is only for you. Do not share this medicine with others. What if I miss a dose? If you miss a dose, take it as soon as you can. If it is almost time for your next dose, take only that dose. Do not take double or extra doses. If you do not take your medicine for several days, contact your health care provider. Your dose may need to be changed. What may interact with this medicine?  acetazolamide  amantadine  cimetidine  dextromethorphan  dofetilide  hydrochlorothiazide  ketamine  metformin  methazolamide  quinidine  ranitidine  sodium bicarbonate  triamterene This list may not describe all possible interactions. Give your health care provider a list of all the medicines, herbs, non-prescription drugs, or dietary supplements you use. Also tell them if you smoke, drink alcohol, or use illegal drugs. Some items may interact with your medicine. What should I watch for while using this medicine? Visit your doctor or health care professional for regular checks on your progress. Check with your doctor or health care professional if there is no improvement in your symptoms or if they get worse. You may get drowsy or dizzy. Do not drive, use machinery, or do anything that needs mental alertness until you know how this drug affects you.  Do not stand or sit up quickly, especially if you are an older patient. This reduces the risk of dizzy or fainting spells. Alcohol can make you more drowsy and dizzy. Avoid alcoholic drinks. What side effects may I notice from receiving this medicine? Side effects that you should report to your doctor or health care professional as soon as possible:  allergic reactions like skin rash,  itching or hives, swelling of the face, lips, or tongue  agitation or a feeling of restlessness  depressed mood  dizziness  hallucinations  redness, blistering, peeling or loosening of the skin, including inside the mouth  seizures  vomiting Side effects that usually do not require medical attention (report to your doctor or health care professional if they continue or are bothersome):  constipation  diarrhea  headache  nausea  trouble sleeping This list may not describe all possible side effects. Call your doctor for medical advice about side effects. You may report side effects to FDA at 1-800-FDA-1088. Where should I keep my medicine? Keep out of the reach of children. Store at room temperature between 15 degrees and 30 degrees C (59 degrees and 86 degrees F). Throw away any unused medicine after the expiration date. NOTE: This sheet is a summary. It may not cover all possible information. If you have questions about this medicine, talk to your doctor, pharmacist, or health care provider.  2021 Elsevier/Gold Standard (2013-05-25 14:10:42)

## 2021-01-11 NOTE — Progress Notes (Signed)
[facility] [address] [place]. [place] [address] [phone]       OFFICE FOLLOW-UP NOTE  [name] Date of Birth:  November 13, 1945 Medical Record Number:  [id number]    Chief Complaint  Patient presents with  . Follow-up    Rm 14 with spouse ([name] [name]) Pt is well, no new stroke symptoms. Wife states he has gotten better as far as memory, mood, cognition       HPI:   Today, 01/11/2021, [name] [name] returns for follow-up after being seen 3 months ago with concerns of cognitive decline and behavioral changes as well as prior stroke history.  Work-up for underlying causes unremarkable including MRI and lab work.  EEG not yet completed. Per wife, improvement of mood and currently at baseline.  Cognition has been stable without worsening although MMSE today 16/30 (prior 19/30).  He questions possibility of returning to driving.  He admits to remaining sedentary with limited to no physical activity and no mental stimulation.  No further behavioral concerns.  History of stroke without new stroke/TIA symptoms.  Compliant on aspirin and Crestor without associated side effects.  Recent lipid panel and A1c satisfactory.  Blood pressure today 142/93 on multiple antihypertensives.   MMSE - Mini Mental State Exam 01/11/2021 11/14/2020  Orientation to time 1 2  Orientation to Place 4 5  Registration 3 3  Attention/ Calculation 0 0  Recall 0 0  Language- name 2 objects 2 2  Language- repeat 1 1  Language- follow 3 step command 3 3  Language- read & follow direction 1 1  Write a sentence 1 1  Copy design 0 1  Total score 16 19      History provided for reference purposes only Update 11/14/2020 JM: [name] [name] is being seen for acute visit by request of wife due to worsening cognition and behavioral changes.  Majority of history and information obtained by wife.  Cognition has been slowly declining since prior visit approximately 4 months ago.  Denies any recent or  acute changes He has been having difficulty with short-term memory, processing of information and confusion which can worsen in the evening time.  Becomes agitated quickly and will curse which is not like him per wife.  Denies aggressiveness, hallucinations, paranoia or delusions Sleeps well at night although frequent nocturia.  Denies snoring or daytime fatigue Admits to sedentary lifestyle with no routine physical activity but will accompany wife to visits with family, grocery store, going out for breakfast and doctor's appointments Previously working with SLP but since completed and does not do exercises as recommended or any type of memory exercises Able to maintain ADLs with only some assistance by wife due to balanice difficulties. He does not do any type of cleaning, cooking or bill paying.  He does not drive.  Wife is his primary caregiver and provides 24/7 supervision  Compliant on aspirin and Crestor -denies associated side effects.  No recent lipid panel Blood pressure today 163/83 on carvedilol, amlodipine, and hydralazineper PCP.  Similar reading on recheck  No further concerns at this time  Update 07/11/2020 JM: [name] [name] is being seen for 62-month stroke follow-up accompanied by his wife.  Reports residual occasional imbalance and cognitive impairment with improvement since prior visit.  He has since completed therapies but continues to do exercises at home as recommended.  He questions potential return to driving.  Denies new stroke/TIA symptoms.  Remains on aspirin and Crestor for secondary stroke prevention of side effects.  Blood pressure today 176/98 and similar on recheck.   Occasionally monitors at home typically SBP 150s per wife.  No further concerns at this time.  Initial visit 01/07/2020 Dr. Leonie [name]: [name] [name] is a 75 year old pleasant African-American male seen today for initial office follow-up visit following hospital consultation for stroke in February 2021.  Is accompanied  by his wife.  History is obtained from them, review of electronic medical records and I personally reviewed imaging films in PACS.  He has past medical history of hyperlipidemia, hypertension, presented to [facility] with back pain and epigastric pain on 10/08/2019 and was found to have distal thoracic aortic aneurysm and small bilateral pulmonary embolisms.  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  He developed altered mental status and weakness in 10/17/2019 and CT scan showed hypodensities in right cerebellum and right occipital lobe suspicious for infarcts.  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  MRA of the head showed chronic moderate left P1 stenosis.  Carotid Dopplers were unremarkable.  2D echo showed normal ejection fraction.  LDL cholesterol was 72 mg percent hemoglobin A1c was 6.7.  Patient was initially started on IV heparin and was planned for being discharged on Eliquis and went to inpatient rehab.  However his medication list today states that he is on aspirin 81 mg only and patient is unable to tell me the reason for this.  He is currently getting home physical and occupational therapy which she has just finished but outpatient therapy has not yet started.  He walks with a wheeled walker but feels his balance is off and tends to fall backwards and has poor truncal balance.  He can walk with a walker short distances well.  He has had no falls or injuries.  He just had follow-up visit with Dr. Letta [name] today.  He does complain of mild memory difficulties and cognitive impairment since his stroke.  This is not progressive.   ROS:   14 system review of systems is positive for those listed in HPI and all other systems negative PMH:  Past Medical History:  Diagnosis Date  . Chronic back pain    stenosis  . Enlarged prostate   . GERD (gastroesophageal reflux disease)    occasionally will take a zantac(maybe once a month)  .  History of colon polyps   . History of kidney stones   . History of stress test    done 10 yrs. ago, as a baseline   . Hyperlipidemia    takes Crestor daily  . Hypertension    takes Amlodipine daily and Lotensin as well  . Stroke [facility])     Social History:  Social History   Socioeconomic History  . Marital status: Married    Spouse name: Not on file  . Number of children: Not on file  . Years of education: Not on file  . Highest education level: Not on file  Occupational History  . Not on file  Tobacco Use  . Smoking status: Former Smoker    Packs/day: 0.25    Years: 40.00    Pack years: 10.00    Types: Cigarettes  . Smokeless tobacco: Never Used  . Tobacco comment: none since 06/24/13  Vaping Use  . Vaping Use: Never used  Substance and Sexual Activity  . Alcohol use: No  . Drug use: No  . Sexual activity: Not on file  Other Topics Concern  . Not on file  Social History Narrative  .  Not on file   Social Determinants of Health   Financial Resource Strain: Not on file  Food Insecurity: Not on file  Transportation Needs: Not on file  Physical Activity: Not on file  Stress: Not on file  Social Connections: Not on file  Intimate Partner Violence: Not on file    Medications:   Current Outpatient Medications on File Prior to Visit  Medication Sig Dispense Refill  . acetaminophen (TYLENOL) 500 MG tablet Take 1,000 mg by mouth every 6 (six) hours as needed for headache (pain).    Marland Kitchen acyclovir (ZOVIRAX) 400 MG tablet 1 tablet    . amLODipine (NORVASC) 2.5 MG tablet Take 2.5 mg by mouth daily.     . Ascorbic Acid (VITAMIN C) 500 MG CAPS Take 500 mg by mouth daily.     Marland Kitchen aspirin EC 81 MG tablet Take 81 mg by mouth daily.    . Calcium Carbonate-Vitamin D (CALCIUM-D PO) Take 1 tablet by mouth daily.    . carvedilol (COREG) 25 MG tablet Take 1 tablet (25 mg total) by mouth 2 (two) times daily with a meal. 60 tablet 0  . Cyanocobalamin (VITAMIN B-12 PO) Take 1 tablet by  mouth daily.    . hydrALAZINE (APRESOLINE) 10 MG tablet Take 1 tablet (10 mg total) by mouth every 6 (six) hours. 180 tablet 0  . magnesium citrate SOLN Take 1 Bottle by mouth daily as needed for severe constipation.     . metFORMIN (GLUCOPHAGE) 500 MG tablet Take 500 mg by mouth 2 (two) times daily.    . Multiple Vitamin (MULTIVITAMIN WITH MINERALS) TABS tablet Take 1 tablet by mouth daily.    . Multiple Vitamins-Minerals (MULTI VITAMIN/MINERALS) TABS See admin instructions.    . pantoprazole (PROTONIX) 40 MG tablet Take 1 tablet (40 mg total) by mouth daily. 30 tablet 0  . rosuvastatin (CRESTOR) 10 MG tablet Take 1 tablet (10 mg total) by mouth daily. 30 tablet 0  . sildenafil (VIAGRA) 100 MG tablet 1 tablet as needed    . tamsulosin (FLOMAX) 0.4 MG CAPS capsule Take 1 capsule (0.4 mg total) by mouth daily. 30 capsule 0  . Vitamin D, Cholecalciferol, 10 MCG (400 UNIT) CHEW 1 tablet     No current facility-administered medications on file prior to visit.    Allergies:   Allergies  Allergen Reactions  . Lyrica [Pregabalin] Nausea Only and Other (See Comments)    Hallucinations and dizziness, also  . Sulfa Antibiotics Other (See Comments)    Unknown reaction    Physical Exam Today's Vitals   01/11/21 1238  BP: (!) 142/93  Pulse: (!) 57  Weight: 194 lb (88 kg)  Height: 6' (1.829 m)   Body mass index is 26.31 kg/m.   General: well developed, well nourished pleasant elderly African-American male, seated, in no evident distress Head: head normocephalic and atraumatic.  Neck: supple with no carotid or supraclavicular bruits Cardiovascular: regular rate and rhythm, no murmurs Musculoskeletal: no deformity Skin:  no rash/petichiae Vascular:  Normal pulses all extremities  Neurologic Exam Mental Status: Awake and fully alert.  Slowed hesitant speech but no evidence of aphasia or dysarthria.  Oriented to place but disoriented to time. Recent and remote memory diminished. attention  span, concentration and fund of knowledge diminished. Mood and affect appropriate. MMSE today 16/30 (prior 19/30) with greater deficit in orientation to time, attention and calculation, recall and drawing.  Cranial Nerves: Pupils equal, briskly reactive to light. Extraocular movements full without nystagmus. Visual  fields full to confrontation. Hearing intact. Facial sensation intact. Face, tongue, palate moves normally and symmetrically.  Motor: Normal bulk and tone. Normal strength in all tested extremity muscles. Sensory.: intact to touch ,pinprick .position and vibratory sensation.  Coordination: Rapid alternating movements normal in all extremities. Finger-to-nose and heel-to-shin performed accurately bilaterally. Gait and Station: Arises from chair without difficulty. Stance is broad-based gait demonstrates mild ataxia and mild imbalance with use of rolling walker.  Tandem walk and heel toe not attempted reflexes: 1+ and symmetric. Toes downgoing.       ASSESSMENT/PLAN: 75 year old African-American male with multiple by cerebral infarcts in February 2021 s/p aortic graft surgery for aortic dissection with residual mild cognitive and gait impairment.    1. 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 Likely progression of vascular cognitive impairment potentially to dementia range  i. MR brain 4/13 no acute abnormality ii. EEG not yet completed iii. Dementia panel within normal limits b. Start Namenda titration -discussed potential side effects c. discussed importance of managing stroke risk factors and increase physical activity as well as memory exercises to help prevent or slow worsening.  Exercises and activities provided at visit d. He was advised not to return to driving at this time due to continued progression of cognitive impairment with concern of impacting ability to safely operate a vehicle - can discuss at follow up visit based on cognitive evaluation at that  time 2. Multiple small brain infarcts:  a. Continue aspirin 81 mg daily  and Crestor for secondary stroke prevention.   b. Discussed secondary stroke prevention measures and importance of close PCP f/u for aggressive stroke risk factor management  3. HTN: BP goal <130/90.  Stable on hydralazine, carvedilol and amlodipine per PCP 4. HLD: LDL goal <70.  LDL 56 (10/2020) on Crestor 10 mg daily per PCP 5. 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  A1c 5.9 (10/2020) on Metformin per PCP    Follow-up in 6 months or call earlier if needed  CC:  [place] provider: Dr. Rulon Sera, MD    Frann Rider, [facility]  [facility] [address] [place], [place] [address]  Phone [phone] Fax [phone] Note: This document was prepared with digital dictation and possible smart phrase technology. Any transcriptional errors that result from this process are unintentional.

## 2021-01-11 NOTE — Progress Notes (Signed)
I agree with the above plan 

## 2021-01-24 ENCOUNTER — Encounter: Payer: Self-pay | Admitting: Physical Medicine & Rehabilitation

## 2021-01-24 ENCOUNTER — Other Ambulatory Visit: Payer: Self-pay

## 2021-01-24 ENCOUNTER — Encounter: Payer: Medicare Other | Attending: Physical Medicine & Rehabilitation | Admitting: Physical Medicine & Rehabilitation

## 2021-01-24 VITALS — BP 161/89 | HR 50 | Temp 98.7°F | Ht 72.0 in | Wt 198.8 lb

## 2021-01-24 DIAGNOSIS — I69398 Other sequelae of cerebral infarction: Secondary | ICD-10-CM | POA: Diagnosis not present

## 2021-01-24 DIAGNOSIS — I69319 Unspecified symptoms and signs involving cognitive functions following cerebral infarction: Secondary | ICD-10-CM | POA: Diagnosis not present

## 2021-01-24 DIAGNOSIS — Z8673 Personal history of transient ischemic attack (TIA), and cerebral infarction without residual deficits: Secondary | ICD-10-CM | POA: Insufficient documentation

## 2021-01-24 DIAGNOSIS — R269 Unspecified abnormalities of gait and mobility: Secondary | ICD-10-CM | POA: Insufficient documentation

## 2021-01-24 NOTE — Progress Notes (Signed)
Subjective:    Patient ID: [name] [name], male    DOB: 11/16/1945, 75 y.o.   MRN: [id number] 75 y.o.right-handed malewith history of chronic back pain with lumbar discectomy 2014 as well as anterior lateral lumbar fusion 2016, hypertension, diabetes mellitus, hyperlipidemia and remote tobacco abuse. Per chart review lives with spouse independent prior to admission. Presented 10/08/2019 with lower chest and back pain with epigastric discomfort times several weeks as well as bouts of nausea and vomiting. In the ED blood pressure 180s/130s. EKG showed nonspecific ST-T changes not significantly different from prior EKG 2016. Troponin high-sensitivity 28, potassium 3.3, creatinine 1.25, hemoglobin 14.5, SARS coronavirus negative. Chest x-ray no active disease. 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 Small pulmonary embolus involving the central right lobe pulmonary artery as well as probable additional small subsegmental embolus in the right lower lobe. Echocardiogram with ejection fraction 65%. 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 Donzetta Matters. Placed on intravenous heparin therapy. Neurology consulted 10/17/2019 for altered mental status. 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 Carotid Dopplers with no ICA stenosis. Neurology follow-up heparin was transitioned to Eliquis. Hospital course blood loss anemia 9.3 follow-up GI services there was some fear of possible GI bleed related to intravenous heparin as well as transition to Eliquis no overt bleeding noted hemoglobin hematocrit remained stable no further work-up indicated. Patient was admitted for a comprehensive rehab program. HPI  Patient returns today with his wife.  Last seen 6 months ago.  He is now  approximately 15 months post stroke.  He has been seeing neurology for cognitive dysfunction.  Folstein Mini-Mental Status Examination score 16 earlier this month.  Repeat MRI showed new new infarcts.  Wife assist the patient with medications.  Physically he uses a walker Wife reports that orthopedic spine surgery is recommending further decompressive surgery but needs medical clearance.  He has a history of extensive aortic aneurysm repair as well as his stroke so I recommended that the wife contacts both vascular surgery Dr. Donzetta Matters as well as Dr. Leonie Man from neurology. No recent falls no recent hospitalizations The patient would like to drive we discussed that because of his long-term issues poststroke that this is not advisable and he understands Pain Inventory Average Pain 5 Pain Right Now 0 My pain is dull  In the last 24 hours, has pain interfered with the following? General activity 5 Relation with others 5 Enjoyment of life 5 What TIME of day is your pain at its worst? morning  Sleep (in general) Good  Pain is worse with: bending Pain improves with: rest Relief from Meds: n/a  History reviewed. No pertinent family history. Social History   Socioeconomic History  . Marital status: Married    Spouse name: Not on file  . Number of children: Not on file  . Years of education: Not on file  . Highest education level: Not on file  Occupational History  . Not on file  Tobacco Use  . Smoking status: Former Smoker    Packs/day: 0.25    Years: 40.00    Pack years: 10.00    Types: Cigarettes  . Smokeless tobacco: Never Used  . Tobacco comment: none since 06/24/13  Vaping Use  . Vaping Use: Never used  Substance and Sexual Activity  . Alcohol use: No  . Drug use:  No  . Sexual activity: Not on file  Other Topics Concern  . Not on file  Social History Narrative  . Not on file   Social Determinants of Health   Financial Resource Strain: Not on file  Food Insecurity: Not on  file  Transportation Needs: Not on file  Physical Activity: Not on file  Stress: Not on file  Social Connections: Not on file   Past Surgical History:  Procedure Laterality Date  . ANTERIOR LAT LUMBAR FUSION Left 12/15/2014   Procedure: ANTERIOR LATERAL LUMBAR FUSION 1 LEVEL;  Surgeon: Phylliss Bob, MD;  Location: [place];  Service: Orthopedics;  Laterality: Left;  Left sided lateral lumbar interbody fusion, lumbar 3-4, posterior spinal fusion, lumbar 3-4 with instrumentation.  . COLONOSCOPY    . ESOPHAGOGASTRODUODENOSCOPY    . fatty tissue removed from stomach    . LUMBAR LAMINECTOMY/DECOMPRESSION MICRODISCECTOMY N/A 07/08/2013   Procedure: LUMBAR LAMINECTOMY/DECOMPRESSION MICRODISCECTOMY;  Surgeon: Sinclair Ship, MD;  Location: [facility];  Service: Orthopedics;  Laterality: N/A;  Lumbar 3-4, lumbar 4-5 decompression  . RADIOLOGY WITH ANESTHESIA N/A 10/22/2019   Procedure: MRI WITH ANESTHESIA OF LUMBAR SPINE WITHOUT CONTRAST;  Surgeon: Radiologist, Medication, MD;  Location: [place];  Service: Radiology;  Laterality: N/A;  . right ankle surgery     as child   . THORACIC AORTIC ENDOVASCULAR STENT GRAFT N/A 10/15/2019   Procedure: THORACIC AORTIC ENDOVASCULAR STENT GRAFT;  Surgeon: Waynetta Sandy, MD;  Location: [facility];  Service: Vascular;  Laterality: N/A;  . TONSILLECTOMY     as a child   Past Surgical History:  Procedure Laterality Date  . ANTERIOR LAT LUMBAR FUSION Left 12/15/2014   Procedure: ANTERIOR LATERAL LUMBAR FUSION 1 LEVEL;  Surgeon: Phylliss Bob, MD;  Location: [place] [place];  Service: Orthopedics;  Laterality: Left;  Left sided lateral lumbar interbody fusion, lumbar 3-4, posterior spinal fusion, lumbar 3-4 with instrumentation.  . COLONOSCOPY    . ESOPHAGOGASTRODUODENOSCOPY    . fatty tissue removed from stomach    . LUMBAR LAMINECTOMY/DECOMPRESSION MICRODISCECTOMY N/A 07/08/2013   Procedure: LUMBAR LAMINECTOMY/DECOMPRESSION MICRODISCECTOMY;  Surgeon: Sinclair Ship, MD;  Location: [facility];  Service: Orthopedics;  Laterality: N/A;  Lumbar 3-4, lumbar 4-5 decompression  . RADIOLOGY WITH ANESTHESIA N/A 10/22/2019   Procedure: MRI WITH ANESTHESIA OF LUMBAR SPINE WITHOUT CONTRAST;  Surgeon: Radiologist, Medication, MD;  Location: [facility];  Service: Radiology;  Laterality: N/A;  . right ankle surgery     as child   . THORACIC AORTIC ENDOVASCULAR STENT GRAFT N/A 10/15/2019   Procedure: THORACIC AORTIC ENDOVASCULAR STENT GRAFT;  Surgeon: Waynetta Sandy, MD;  Location: [facility];  Service: Vascular;  Laterality: N/A;  . TONSILLECTOMY     as a child   Past Medical History:  Diagnosis Date  . Chronic back pain    stenosis  . Enlarged prostate   . GERD (gastroesophageal reflux disease)    occasionally will take a zantac(maybe once a month)  . History of colon polyps   . History of kidney stones   . History of stress test    done 10 yrs. ago, as a baseline   . Hyperlipidemia    takes Crestor daily  . Hypertension    takes Amlodipine daily and Lotensin as well  . Stroke ([facility])    Temp 98.7 F (37.1 C)   Ht 6' (1.829 m)   Wt 198 lb 12.8 oz (90.2 kg)   BMI 26.96 kg/m   Opioid Risk Score:  Fall Risk Score:  `1  Depression screen PHQ 2/9  Depression screen [facility] 2/9 11/14/2020 11/24/2019  Decreased Interest 1 2  Down, Depressed, Hopeless 3 1  PHQ - 2 Score 4 3  Altered sleeping 2 0  Tired, decreased energy 3 1  Change in appetite 2 0  Feeling bad or failure about yourself  2 1  Trouble concentrating 2 2  Moving slowly or fidgety/restless 3 1  Suicidal thoughts 0 0  PHQ-9 Score 18 8    Review of Systems  Constitutional: Negative.   HENT: Negative.   Eyes: Negative.   Respiratory: Negative.   Cardiovascular: Negative.   Gastrointestinal: Negative.   Endocrine: Negative.   Genitourinary: Negative.   Musculoskeletal: Positive for gait problem.  Skin: Negative.   Hematological: Negative.   Psychiatric/Behavioral: Negative.   All  other systems reviewed and are negative.      Objective:   Physical Exam Constitutional:      Appearance: He is normal weight.  Eyes:     Extraocular Movements: Extraocular movements intact.     Conjunctiva/sclera: Conjunctivae normal.     Pupils: Pupils are equal, round, and reactive to light.  Skin:    General: Skin is warm and dry.  Neurological:     Mental Status: He is alert and oriented to [place], place, and time.     Cranial Nerves: No dysarthria.     Coordination: Romberg sign positive. Finger-Nose-Finger Test normal.     Comments: Oriented to [place] not year or month or day of week Oriented to city not facility  5/5 strength  BUE and BLE  No evidence of dysmetria finger-nose-finger right or left Ambulates with wide-based support no evidence of toe drag or knee instability   Psychiatric:        Mood and Affect: Mood normal.        Behavior: Behavior normal.           Assessment & Plan:  #1.  Bilateral cortical and cerebellar cardioembolic infarcts with residual cognitive dysfunction and gait disorder.  Do not feel like any further functional improvement will occur at this point.  Recommend continuing the based exercise program to avoid decline in function.  Follow-up with neurology. As discussed with patient and wife do not recommend driving because of his cognitive dysfunction Physical medicine rehab follow-up on as-needed basis

## 2021-01-24 NOTE — Patient Instructions (Addendum)
[facility] [employer] has a therapist that can help with work outs  No driving

## 2021-01-26 DIAGNOSIS — I639 Cerebral infarction, unspecified: Secondary | ICD-10-CM | POA: Diagnosis not present

## 2021-01-26 DIAGNOSIS — K219 Gastro-esophageal reflux disease without esophagitis: Secondary | ICD-10-CM | POA: Diagnosis not present

## 2021-01-26 DIAGNOSIS — E1122 Type 2 diabetes mellitus with diabetic chronic kidney disease: Secondary | ICD-10-CM | POA: Diagnosis not present

## 2021-01-26 DIAGNOSIS — I10 Essential (primary) hypertension: Secondary | ICD-10-CM | POA: Diagnosis not present

## 2021-01-26 DIAGNOSIS — E782 Mixed hyperlipidemia: Secondary | ICD-10-CM | POA: Diagnosis not present

## 2021-01-26 DIAGNOSIS — E118 Type 2 diabetes mellitus with unspecified complications: Secondary | ICD-10-CM | POA: Diagnosis not present

## 2021-01-26 DIAGNOSIS — N183 Chronic kidney disease, stage 3 unspecified: Secondary | ICD-10-CM | POA: Diagnosis not present

## 2021-02-03 ENCOUNTER — Other Ambulatory Visit: Payer: Self-pay | Admitting: Adult Health

## 2021-02-03 DIAGNOSIS — F0150 Vascular dementia without behavioral disturbance: Secondary | ICD-10-CM

## 2021-02-05 ENCOUNTER — Other Ambulatory Visit: Payer: Self-pay | Admitting: Adult Health

## 2021-02-05 DIAGNOSIS — F0150 Vascular dementia without behavioral disturbance: Secondary | ICD-10-CM

## 2021-02-15 DIAGNOSIS — M519 Unspecified thoracic, thoracolumbar and lumbosacral intervertebral disc disorder: Secondary | ICD-10-CM | POA: Diagnosis not present

## 2021-02-15 DIAGNOSIS — Z23 Encounter for immunization: Secondary | ICD-10-CM | POA: Diagnosis not present

## 2021-02-15 DIAGNOSIS — E1122 Type 2 diabetes mellitus with diabetic chronic kidney disease: Secondary | ICD-10-CM | POA: Diagnosis not present

## 2021-02-15 DIAGNOSIS — N183 Chronic kidney disease, stage 3 unspecified: Secondary | ICD-10-CM | POA: Diagnosis not present

## 2021-02-15 DIAGNOSIS — I639 Cerebral infarction, unspecified: Secondary | ICD-10-CM | POA: Diagnosis not present

## 2021-02-15 DIAGNOSIS — Z1389 Encounter for screening for other disorder: Secondary | ICD-10-CM | POA: Diagnosis not present

## 2021-02-15 DIAGNOSIS — Z0000 Encounter for general adult medical examination without abnormal findings: Secondary | ICD-10-CM | POA: Diagnosis not present

## 2021-02-15 DIAGNOSIS — I700 Atherosclerosis of aorta: Secondary | ICD-10-CM | POA: Diagnosis not present

## 2021-02-15 DIAGNOSIS — I10 Essential (primary) hypertension: Secondary | ICD-10-CM | POA: Diagnosis not present

## 2021-02-15 DIAGNOSIS — E782 Mixed hyperlipidemia: Secondary | ICD-10-CM | POA: Diagnosis not present

## 2021-02-23 DIAGNOSIS — E118 Type 2 diabetes mellitus with unspecified complications: Secondary | ICD-10-CM | POA: Diagnosis not present

## 2021-02-23 DIAGNOSIS — K219 Gastro-esophageal reflux disease without esophagitis: Secondary | ICD-10-CM | POA: Diagnosis not present

## 2021-02-23 DIAGNOSIS — E1122 Type 2 diabetes mellitus with diabetic chronic kidney disease: Secondary | ICD-10-CM | POA: Diagnosis not present

## 2021-02-23 DIAGNOSIS — I639 Cerebral infarction, unspecified: Secondary | ICD-10-CM | POA: Diagnosis not present

## 2021-02-23 DIAGNOSIS — N183 Chronic kidney disease, stage 3 unspecified: Secondary | ICD-10-CM | POA: Diagnosis not present

## 2021-02-23 DIAGNOSIS — I10 Essential (primary) hypertension: Secondary | ICD-10-CM | POA: Diagnosis not present

## 2021-02-23 DIAGNOSIS — E782 Mixed hyperlipidemia: Secondary | ICD-10-CM | POA: Diagnosis not present

## 2021-03-20 ENCOUNTER — Ambulatory Visit: Payer: Medicare Other | Admitting: Adult Health

## 2021-03-24 DIAGNOSIS — N183 Chronic kidney disease, stage 3 unspecified: Secondary | ICD-10-CM | POA: Diagnosis not present

## 2021-03-24 DIAGNOSIS — E1122 Type 2 diabetes mellitus with diabetic chronic kidney disease: Secondary | ICD-10-CM | POA: Diagnosis not present

## 2021-03-24 DIAGNOSIS — E782 Mixed hyperlipidemia: Secondary | ICD-10-CM | POA: Diagnosis not present

## 2021-03-24 DIAGNOSIS — I10 Essential (primary) hypertension: Secondary | ICD-10-CM | POA: Diagnosis not present

## 2021-03-24 DIAGNOSIS — K219 Gastro-esophageal reflux disease without esophagitis: Secondary | ICD-10-CM | POA: Diagnosis not present

## 2021-03-24 DIAGNOSIS — E118 Type 2 diabetes mellitus with unspecified complications: Secondary | ICD-10-CM | POA: Diagnosis not present

## 2021-03-27 ENCOUNTER — Ambulatory Visit: Payer: Medicare Other | Admitting: Podiatry

## 2021-03-27 ENCOUNTER — Other Ambulatory Visit: Payer: Self-pay

## 2021-03-27 ENCOUNTER — Encounter: Payer: Self-pay | Admitting: Podiatry

## 2021-03-27 DIAGNOSIS — E1140 Type 2 diabetes mellitus with diabetic neuropathy, unspecified: Secondary | ICD-10-CM | POA: Diagnosis not present

## 2021-03-27 DIAGNOSIS — M21611 Bunion of right foot: Secondary | ICD-10-CM

## 2021-03-27 DIAGNOSIS — M79675 Pain in left toe(s): Secondary | ICD-10-CM

## 2021-03-27 DIAGNOSIS — N183 Chronic kidney disease, stage 3 unspecified: Secondary | ICD-10-CM | POA: Diagnosis not present

## 2021-03-27 DIAGNOSIS — B351 Tinea unguium: Secondary | ICD-10-CM | POA: Diagnosis not present

## 2021-03-27 DIAGNOSIS — M2011 Hallux valgus (acquired), right foot: Secondary | ICD-10-CM

## 2021-03-27 DIAGNOSIS — M79674 Pain in right toe(s): Secondary | ICD-10-CM

## 2021-03-27 DIAGNOSIS — M21612 Bunion of left foot: Secondary | ICD-10-CM

## 2021-03-27 DIAGNOSIS — M2012 Hallux valgus (acquired), left foot: Secondary | ICD-10-CM

## 2021-03-27 NOTE — Progress Notes (Signed)
This patient returns to my office for at risk foot care.  This patient requires this care by a professional since this patient will be at risk due to having acute kidney disease ,DM and CKD. He presents to the office with his wife.  This patient is unable to cut nails himself since the patient cannot reach his nails.These nails are painful walking and wearing shoes.  This patient presents for at risk foot care today.  General Appearance  Alert, conversant and in no acute stress.  Vascular  Dorsalis pedis and posterior tibial  pulses are palpable  bilaterally.  Capillary return is within normal limits  bilaterally. Temperature is within normal limits  bilaterally.  Neurologic  Senn-Weinstein monofilament wire test diminished   bilaterally. Muscle power within normal limits bilaterally.  Nails Thick disfigured discolored nails with subungual debris  from hallux to fifth toes bilaterally. No evidence of bacterial infection or drainage bilaterally.  Orthopedic  No limitations of motion  feet .  No crepitus or effusions noted.  HAV  B/L.  Hammer toes 2-5  B/L.  Skin  normotropic skin with no porokeratosis noted bilaterally.  No signs of infections or ulcers noted.     Onychomycosis  Pain in right toes  Pain in left toes  Consent was obtained for treatment procedures.   Mechanical debridement of nails 1-5  bilaterally performed with a nail nipper.  Filed with dremel without incident.    Return office visit   3 months                   Told patient to return for periodic foot care and evaluation due to potential at risk complications.   Kiernan Atkerson DPM  

## 2021-04-01 IMAGING — CT CT HEAD W/O CM
4 series · 16 of 47 positions shown, 18 images · non-contrast
Comparison: None.

CLINICAL DATA: Altered mental status generalized weakness

EXAM:
CT HEAD WITHOUT CONTRAST
TECHNIQUE: Contiguous axial images were obtained from the base of the skull
through the vertex without intravenous contrast.

[Series 3: head wo · axial · 0.47mm/px · z∈[-98,+17]mm · 7 of 31 slices shown, 9 images]
[im 4/31  brain]
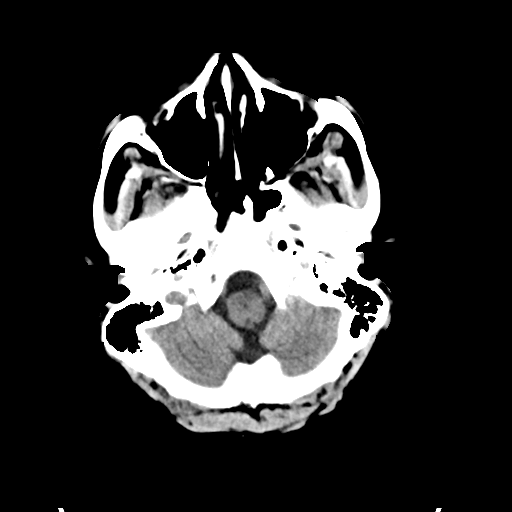
[im 4/31  bone]
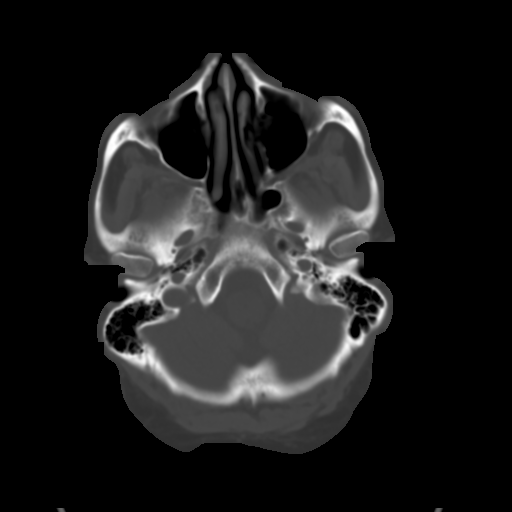
[im 8/31  brain]
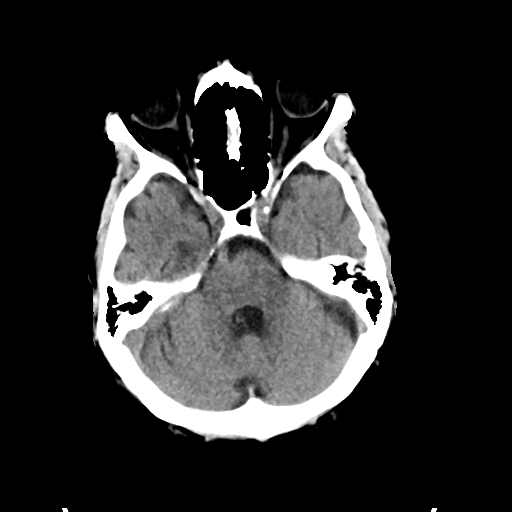
[im 12/31  brain]
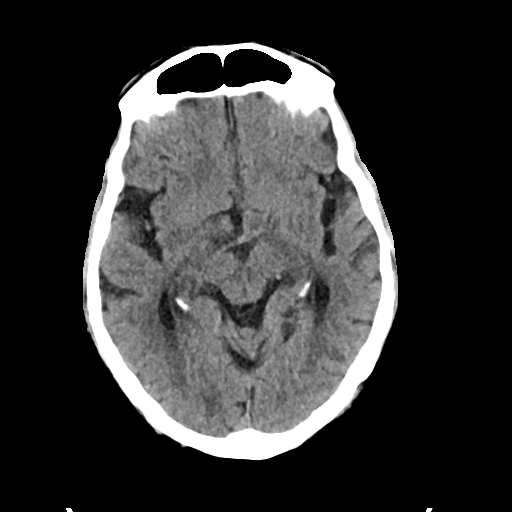
[im 16/31  brain]
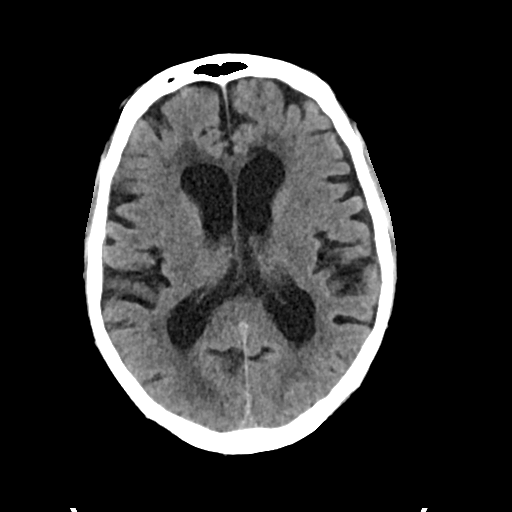
[im 19/31  brain]
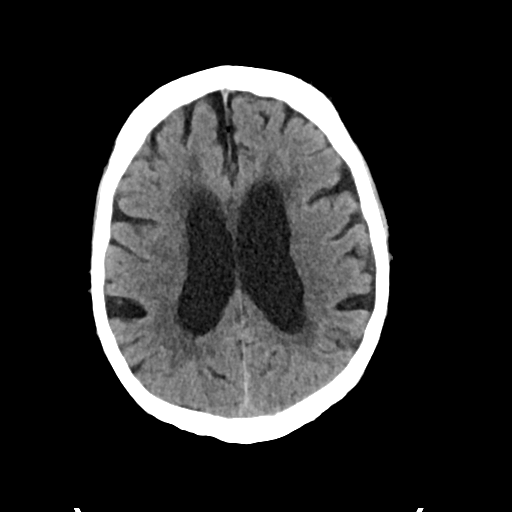
[im 19/31  bone]
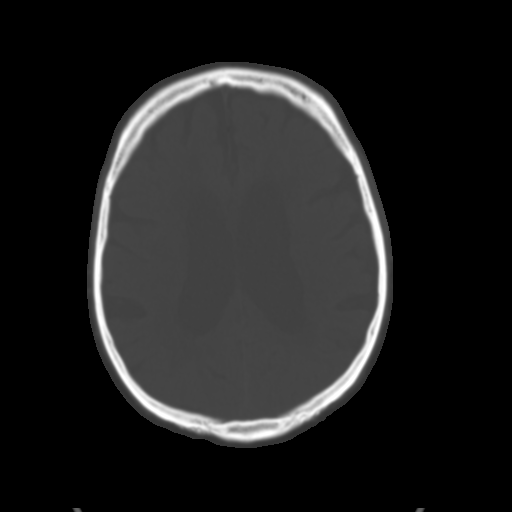
[im 23/31  brain]
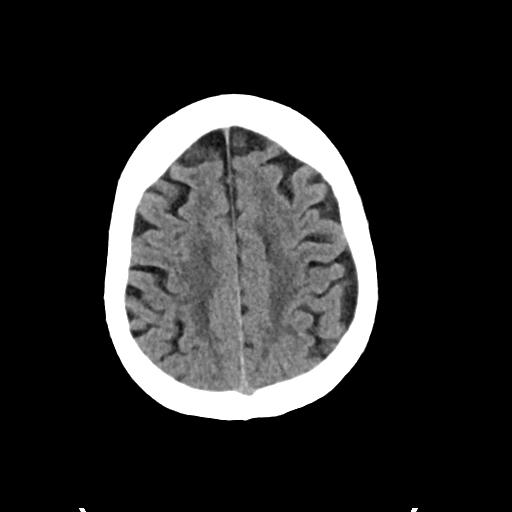
[im 27/31  brain]
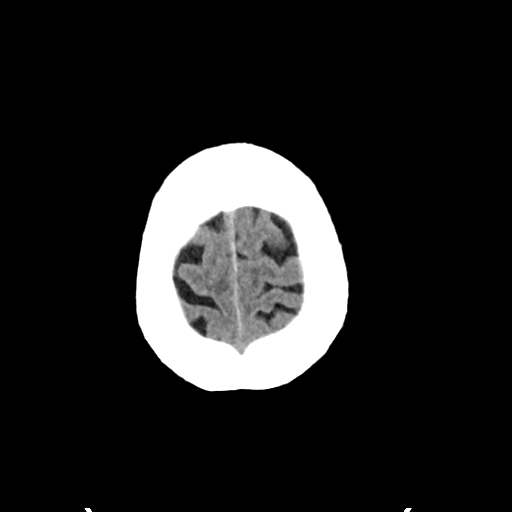

[Series 4: head bone · axial · 0.47mm/px · z∈[-99,-67]mm · 3 of 78 slices shown]
[im 8/78  bone]
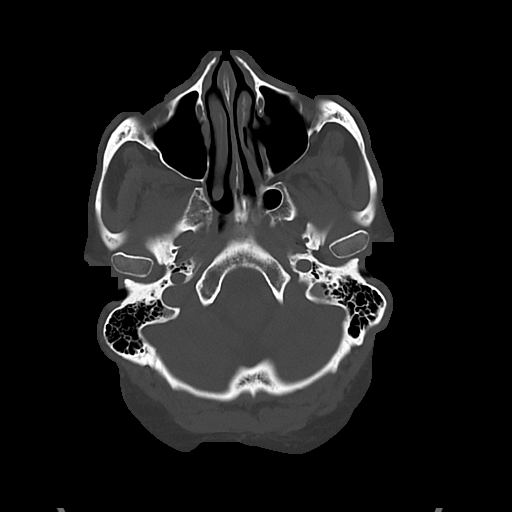
[im 16/78  bone]
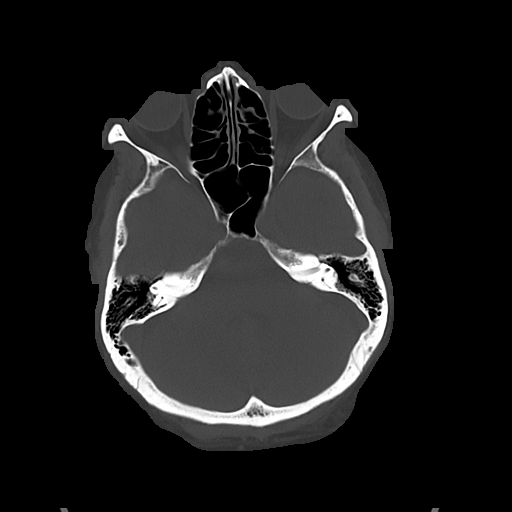
[im 24/78  bone]
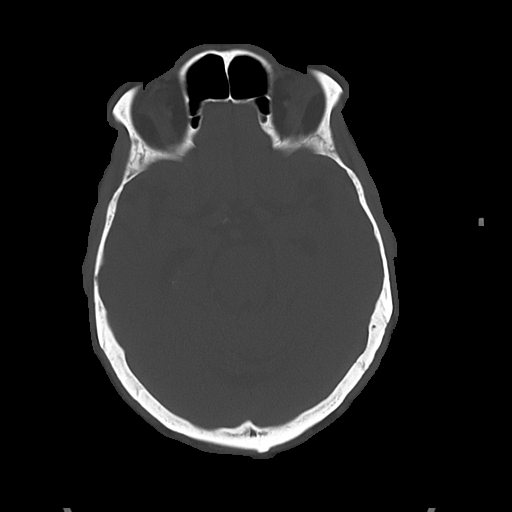

[Series 5: cor soft · coronal · 0.34mm/px · 3 of 67 slices shown]
[im 23/67  brain]
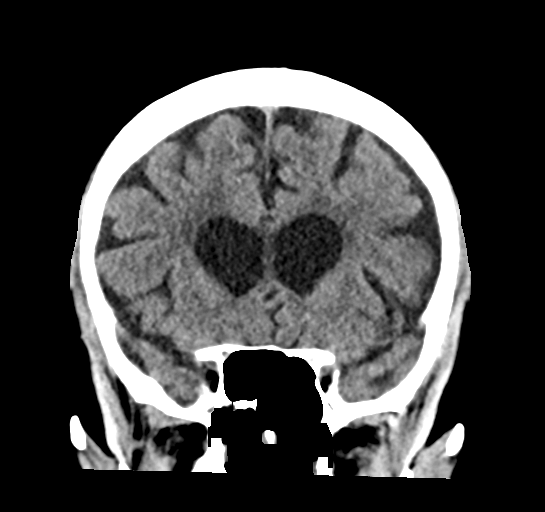
[im 30/67  brain]
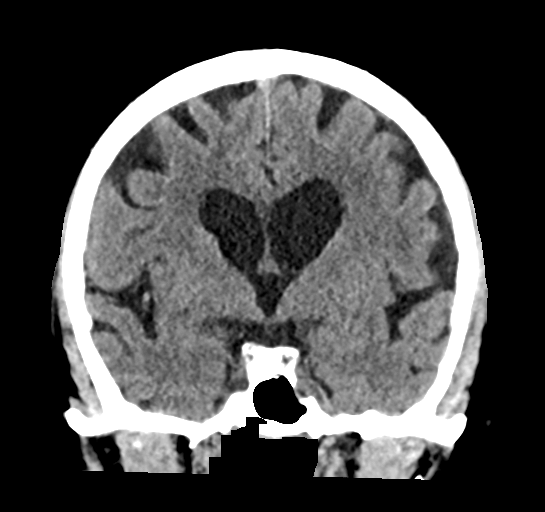
[im 37/67  brain]
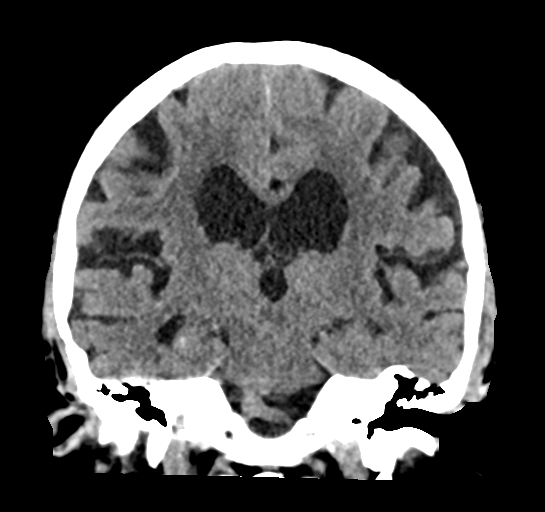

[Series 6: sag soft · sagittal · 0.34mm/px · 3 of 55 slices shown]
[im 19/55  brain]
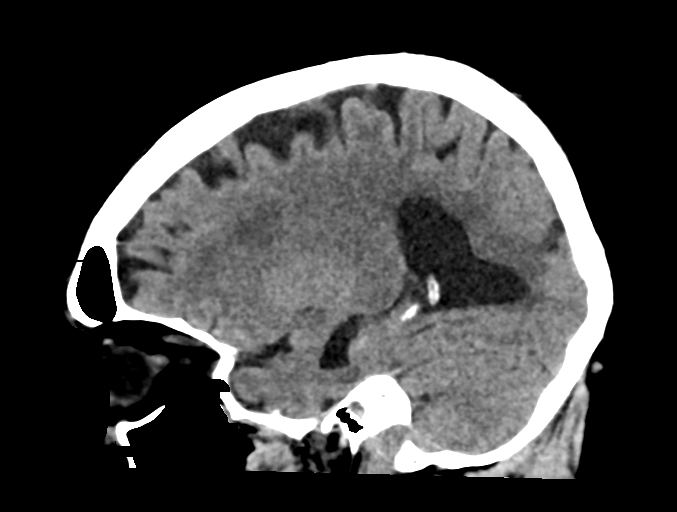
[im 28/55  brain]
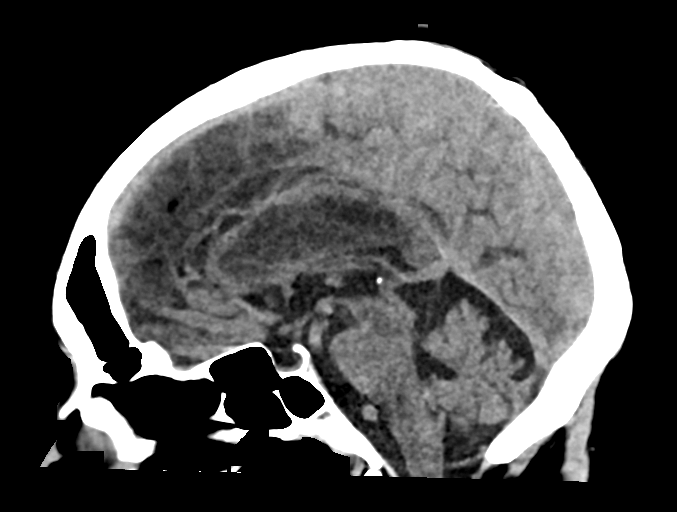
[im 37/55  brain]
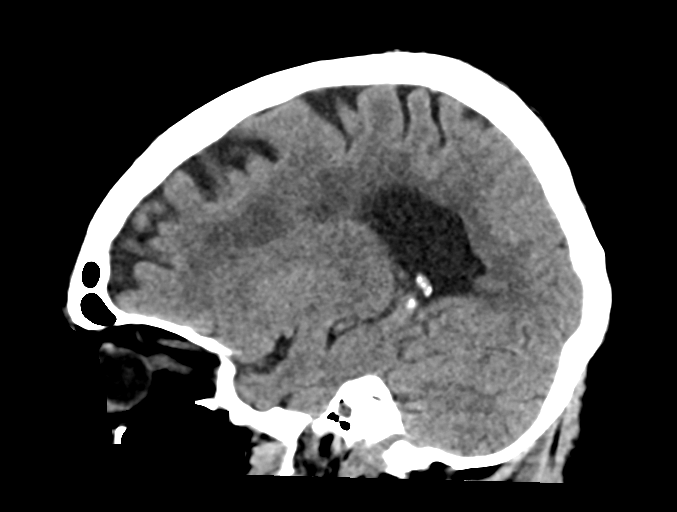

[16 of 47 positions shown; findings below may reference images not displayed]

FINDINGS: Brain: No evidence of acute territorial infarction, hemorrhage,
hydrocephalus,extra-axial collection or mass lesion/mass effect.
There is dilatation the ventricles and sulci consistent with
age-related atrophy. Low-attenuation changes in the deep white
matter consistent with small vessel ischemia.

Vascular: No hyperdense vessel or unexpected calcification.

Skull: The skull is intact. No fracture or focal lesion identified.

Sinuses/Orbits: The visualized paranasal sinuses and mastoid air
cells are clear. The orbits and globes intact.

Other: None
IMPRESSION: No acute intracranial abnormality.

Findings consistent with age related atrophy and chronic small
vessel ischemia

## 2021-04-05 DIAGNOSIS — R599 Enlarged lymph nodes, unspecified: Secondary | ICD-10-CM | POA: Diagnosis not present

## 2021-04-13 DIAGNOSIS — K4020 Bilateral inguinal hernia, without obstruction or gangrene, not specified as recurrent: Secondary | ICD-10-CM | POA: Diagnosis not present

## 2021-04-13 DIAGNOSIS — I700 Atherosclerosis of aorta: Secondary | ICD-10-CM | POA: Diagnosis not present

## 2021-04-13 DIAGNOSIS — I714 Abdominal aortic aneurysm, without rupture: Secondary | ICD-10-CM | POA: Diagnosis not present

## 2021-04-13 DIAGNOSIS — R599 Enlarged lymph nodes, unspecified: Secondary | ICD-10-CM | POA: Diagnosis not present

## 2021-04-13 DIAGNOSIS — I7102 Dissection of abdominal aorta: Secondary | ICD-10-CM | POA: Diagnosis not present

## 2021-04-25 DIAGNOSIS — E118 Type 2 diabetes mellitus with unspecified complications: Secondary | ICD-10-CM | POA: Diagnosis not present

## 2021-04-25 DIAGNOSIS — E782 Mixed hyperlipidemia: Secondary | ICD-10-CM | POA: Diagnosis not present

## 2021-04-25 DIAGNOSIS — E1122 Type 2 diabetes mellitus with diabetic chronic kidney disease: Secondary | ICD-10-CM | POA: Diagnosis not present

## 2021-04-25 DIAGNOSIS — K219 Gastro-esophageal reflux disease without esophagitis: Secondary | ICD-10-CM | POA: Diagnosis not present

## 2021-04-25 DIAGNOSIS — I10 Essential (primary) hypertension: Secondary | ICD-10-CM | POA: Diagnosis not present

## 2021-04-25 DIAGNOSIS — N183 Chronic kidney disease, stage 3 unspecified: Secondary | ICD-10-CM | POA: Diagnosis not present

## 2021-05-11 DIAGNOSIS — N183 Chronic kidney disease, stage 3 unspecified: Secondary | ICD-10-CM | POA: Diagnosis not present

## 2021-05-11 DIAGNOSIS — R269 Unspecified abnormalities of gait and mobility: Secondary | ICD-10-CM | POA: Diagnosis not present

## 2021-05-11 DIAGNOSIS — E782 Mixed hyperlipidemia: Secondary | ICD-10-CM | POA: Diagnosis not present

## 2021-05-11 DIAGNOSIS — I10 Essential (primary) hypertension: Secondary | ICD-10-CM | POA: Diagnosis not present

## 2021-05-11 DIAGNOSIS — I700 Atherosclerosis of aorta: Secondary | ICD-10-CM | POA: Diagnosis not present

## 2021-05-11 DIAGNOSIS — I69322 Dysarthria following cerebral infarction: Secondary | ICD-10-CM | POA: Diagnosis not present

## 2021-05-11 DIAGNOSIS — Z23 Encounter for immunization: Secondary | ICD-10-CM | POA: Diagnosis not present

## 2021-05-11 DIAGNOSIS — I639 Cerebral infarction, unspecified: Secondary | ICD-10-CM | POA: Diagnosis not present

## 2021-05-11 DIAGNOSIS — E1122 Type 2 diabetes mellitus with diabetic chronic kidney disease: Secondary | ICD-10-CM | POA: Diagnosis not present

## 2021-05-11 DIAGNOSIS — H532 Diplopia: Secondary | ICD-10-CM | POA: Diagnosis not present

## 2021-05-23 DIAGNOSIS — I639 Cerebral infarction, unspecified: Secondary | ICD-10-CM | POA: Diagnosis not present

## 2021-05-23 DIAGNOSIS — E1122 Type 2 diabetes mellitus with diabetic chronic kidney disease: Secondary | ICD-10-CM | POA: Diagnosis not present

## 2021-05-23 DIAGNOSIS — E782 Mixed hyperlipidemia: Secondary | ICD-10-CM | POA: Diagnosis not present

## 2021-05-23 DIAGNOSIS — K219 Gastro-esophageal reflux disease without esophagitis: Secondary | ICD-10-CM | POA: Diagnosis not present

## 2021-05-23 DIAGNOSIS — N183 Chronic kidney disease, stage 3 unspecified: Secondary | ICD-10-CM | POA: Diagnosis not present

## 2021-05-23 DIAGNOSIS — E118 Type 2 diabetes mellitus with unspecified complications: Secondary | ICD-10-CM | POA: Diagnosis not present

## 2021-05-23 DIAGNOSIS — I10 Essential (primary) hypertension: Secondary | ICD-10-CM | POA: Diagnosis not present

## 2021-06-13 IMAGING — CT CT ANGIO CHEST-ABD-PELV FOR DISSECTION W/ AND WO/W CM
2 of 16 series · 10 of 46 positions shown, 11 images · IV contrast (OMNIPAQUE)
Comparison: CT the chest, abdomen and pelvis - 01/08/2020;
10/13/2019

CLINICAL DATA: History of thoracic aortic dissection post stent
graft repair.

EXAM:
CT ANGIOGRAPHY CHEST, ABDOMEN AND PELVIS
TECHNIQUE: Non-contrast CT of the chest was initially obtained.

[Series 8: coronals · coronal · 0.99mm/px · 2 of 137 slices shown]
[im 46/137  soft-tissue]
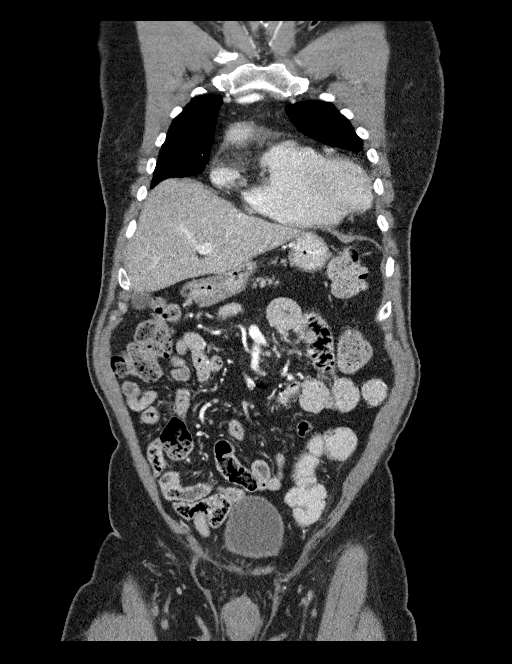
[im 91/137  soft-tissue]
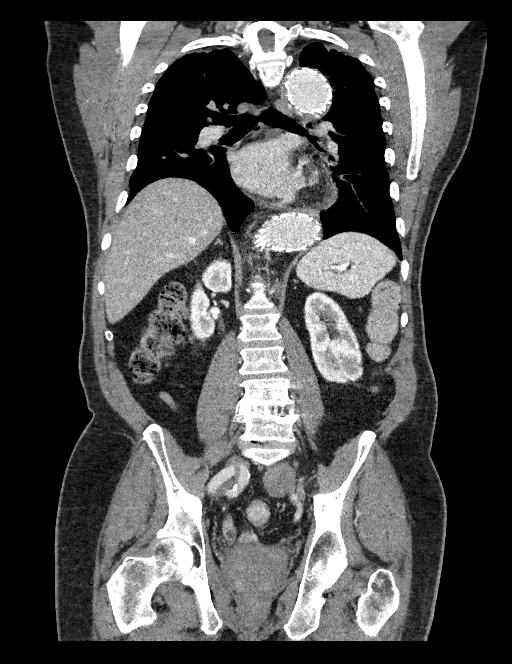

[Series 12: arterial thins · axial · arterial · 0.78mm/px · z∈[-552,-33]mm · 8 of 2047 slices shown, 9 images]
[im 158/2047  soft-tissue]
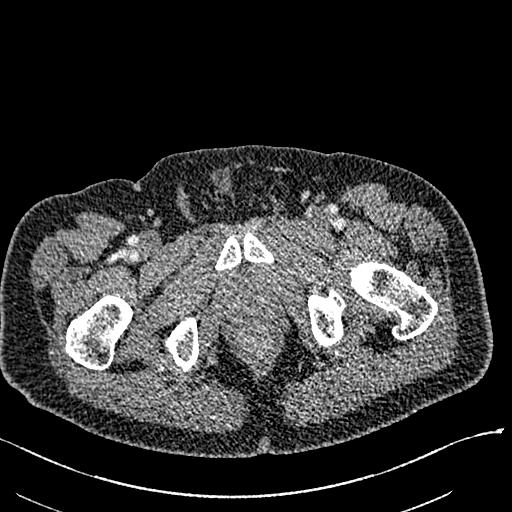
[im 158/2047  bone]
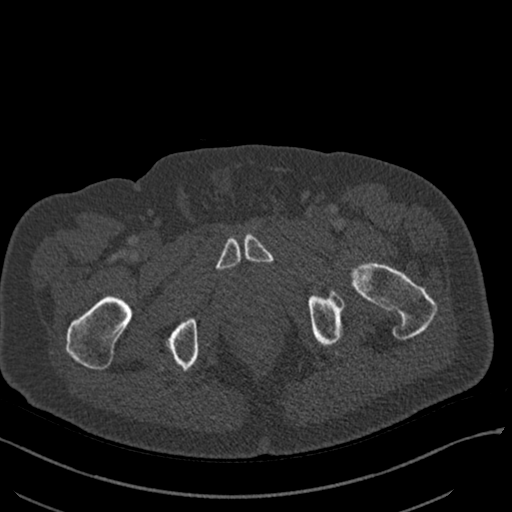
[im 473/2047  soft-tissue]
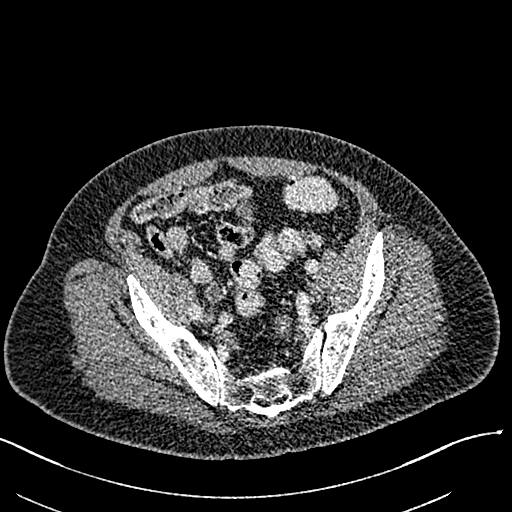
[im 630/2047  soft-tissue]
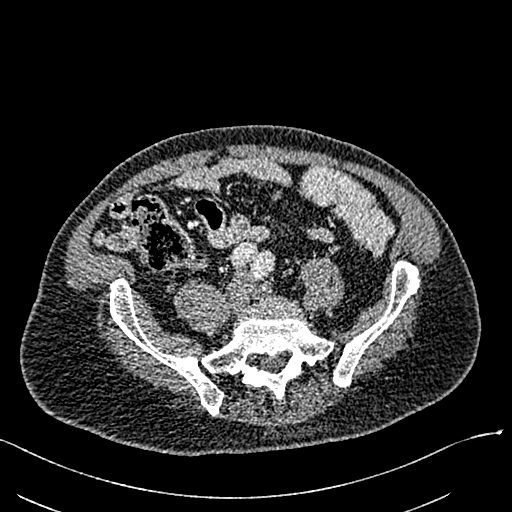
[im 945/2047  soft-tissue]
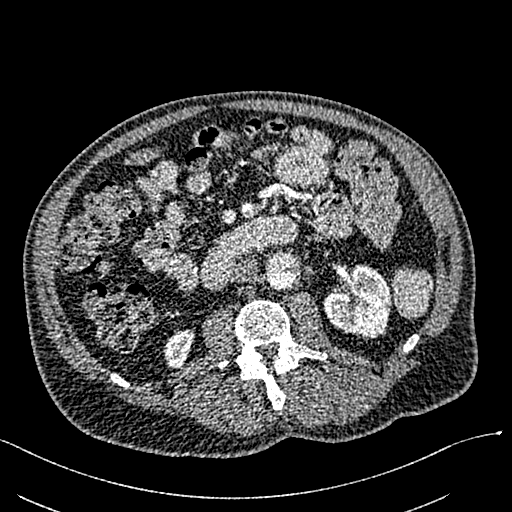
[im 1102/2047  soft-tissue]
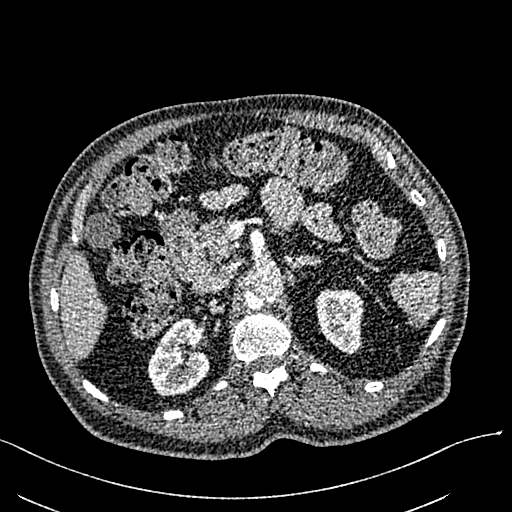
[im 1417/2047  soft-tissue]
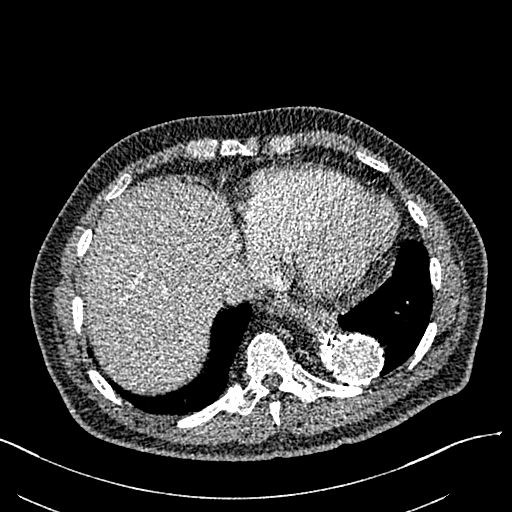
[im 1574/2047  soft-tissue]
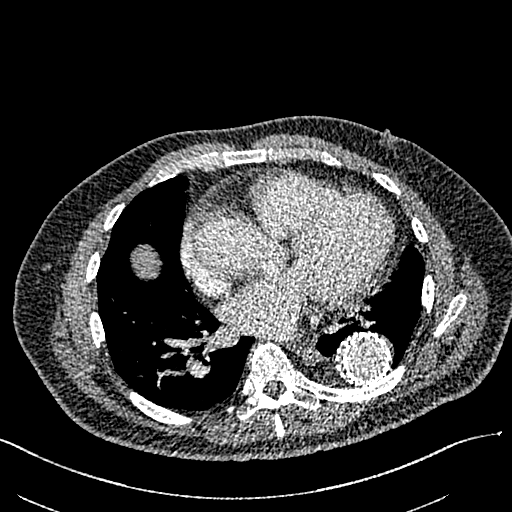
[im 1889/2047  soft-tissue]
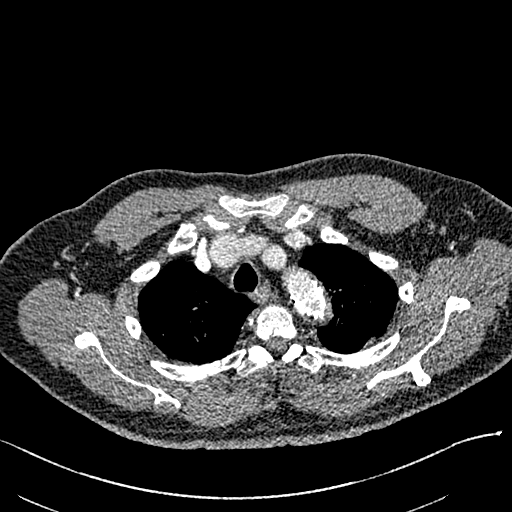

[10 of 46 positions shown; findings below may reference images not displayed]

Multidetector CT imaging through the chest, abdomen and pelvis was
performed using the standard protocol during bolus administration of
intravenous contrast. Multiplanar reconstructed images and MIPs were
obtained and reviewed to evaluate the vascular anatomy.

CONTRAST:  100mL OMNIPAQUE IOHEXOL 350 MG/ML SOLN
FINDINGS: Examination is degraded secondary to suboptimal contrast
opacification timing due to CT malfunction.

CTA CHEST FINDINGS

Vascular Findings:

Stable fusiform aneurysmal dilatation of the ascending thoracic
aorta with measurements as follows.

Stable sequela of overlapping stent graft repair of the descending
thoracic aorta to the level of the diaphragmatic hiatus. The stent
graft is widely patent without evidence of in stent stenosis.

There is persistent opacification of the proximal aspect of the
covered native descending thoracic aorta (axial images 23 -38,
series 7; coronal images 95 through 105, series 8),

likely via a combination the superior intercostal and bronchial
arteries, unchanged compared to the 01/08/2020 examination and
without interval enlargement of the caliber of the native thoracic
aorta at this location. No perivascular stranding.

Bovine configuration of the aortic arch. The branch vessels of the
aortic arch are tortuous though appear patent throughout their
imaged courses.

Review of the precontrast images is negative for the presence of an
intramural hematoma.

Cardiomegaly. Coronary artery calcifications. Trace amount of
pericardial fluid with fluid extending to the pericardial recess,
unchanged.

Although this examination was not tailored for the evaluation the
pulmonary arteries, there are no discrete filling defects within the
central pulmonary arterial tree to suggest central pulmonary
embolism. Normal caliber of the main pulmonary artery.

-------------------------------------------------------------

Thoracic aortic measurements:

Sinotubular junction

42 mm as measured in greatest oblique short axis coronal dimension.

Proximal ascending aorta

48 mm as measured in greatest oblique short axis axial dimension at
the level of the main pulmonary artery (image 39, series 7) and
approximately 48 mm in greatest oblique short axis coronal diameter
(coronal image 60, series 8), unchanged compared to the [DATE]
examination.

Aortic arch aorta

33 mm as measured in greatest oblique short axis sagittal dimension.

Proximal descending thoracic aorta

41 mm as measured in greatest oblique short axis axial dimension at
the level of the main pulmonary artery.

Distal descending thoracic aorta

35 mm as measured in greatest oblique short axis axial dimension at
the level of the diaphragmatic hiatus.

Review of the MIP images confirms the above findings.

-------------------------------------------------------------

Non-Vascular Findings:

Mediastinum/Lymph Nodes: No bulky mediastinal, hilar or axillary
lymphadenopathy.

Lungs/Pleura: Minimal dependent subpleural ground-glass atelectasis.
Minimal subsegmental atelectasis involving the left lower lobe
adjacent to the descending thoracic aorta. No discrete focal
airspace opacities. No pleural effusion or pneumothorax. The central
pulmonary airways appear widely patent. No discrete pulmonary
nodules.

Musculoskeletal: Note is made of an approximately 1.3 cm sclerotic
lesion involving the inferior aspect of the T8 vertebral body
(sagittal image 89, series 9).

Stigmata of dish within the thoracic spine. Regional soft tissues
appear normal. Normal appearance of the thyroid gland.

_________________________________________________________

_________________________________________________________

CTA ABDOMEN AND PELVIS FINDINGS

VASCULAR

Aorta: Redemonstrated extension of previously treated descending
thoracic aortic dissection to the level of the mid/distal abdominal
aorta. Again, both the true and false lumens of the dissection
remained patent. The dissection is again also noted to extend into
the main trunk of the SMA as well as abut the origin of the takeoff
of the right renal artery without definitive extension to involve
the right renal artery. The celiac, left renal artery and IMA are
all noted to arise from the presumed true lumen.

Interval increase in size of aneurysmal dilatation of the infrarenal
abdominal aortic aneurysm, currently measuring 4.5 x 4.5 x 3.9 cm as
measured in greatest oblique short axis coronal (image 64, series
8), sagittal (image 100, series 9) and axial (image 120, series 7),
dimensions respectively, previously, 4.1 x 4.1 x 3.9 cm most
conspicuously about the right lateral aspect of the aneurysm (image
120, series 7). The previously noted trace amount of mural thrombus
within the dominant component of the aneurysm has resolved in the
interval. No perivascular stranding.

Celiac: As above, the celiac artery is noted to arise from the true
lumen. There is a minimal amount of atherosclerotic plaque involving
the origin the celiac artery, not resulting in hemodynamically
significant stenosis. Conventional branching pattern.

SMA: As above, the dissection extends throughout the main trunk of
the SMA with persistent opacification of both the true and false
lumens within the SMA. Both lumens remain patent without evidence of
a hemodynamically significant narrowing. The distal tributaries the
SMA appear patent without discrete lumen filling defect.

Renals: The dissection is noted to abut the origin the right renal
artery without definitive extension to involve the vessel. The left
renal artery arises from the true lumen.

IMA: Remains patent.

Inflow: The bilateral common and external iliac arteries are
tortuous though patent and of normal caliber. The bilateral internal
iliac arteries are patent and of normal caliber.

Veins: The IVC and pelvic venous systems appear patent.

Review of the MIP images confirms the above findings.

_________________________________________________________

NON-VASCULAR

Hepatobiliary: Normal hepatic contour. No discrete hepatic lesions.
Normal appearance of the gallbladder given degree distention. No
radiopaque gallstones. No intra or extrahepatic biliary ductal
dilatation. No ascites.

Pancreas: Normal appearance of the pancreas.

Spleen: Normal appearance of the spleen.

Adrenals/Urinary Tract: There is symmetric enhancement of the
bilateral kidneys. Punctate (3 mm) nonobstructing right-sided renal
stone. No definite evidence of left-sided nephrolithiasis on this
postcontrast examination. Similar appearing areas of geographic
delayed perfusion involving the inferior pole the right kidney
(image 58 and 59, series 14), similar to the [DATE] examination and
while nonspecific potentially representative of previous infarction.
No discrete worrisome renal lesions.

Normal appearance the bilateral adrenal glands.

There is mass effect of the prostate on the undersurface of the
urinary bladder.

Stomach/Bowel: Moderate colonic stool burden without evidence of
enteric obstruction. Normal appearance of the terminal ileum and the
retrocecal appendix. No discrete areas of bowel wall thickening. No
pneumoperitoneum, pneumatosis or portal venous gas.

Lymphatic: Bulky left common iliac chain nodal conglomeration
measuring approximately 3.3 x 2.9 cm (image 153, series 7).
Otherwise, there is no bulky retroperitoneal, mesenteric or inguinal
lymphadenopathy.

Reproductive: The prostate is markedly enlarged measuring at least
7.0 x 6.7 x 7.7 cm (axial image 181, series 7; sagittal image 94,
series 9) with mass effect on the undersurface of the urinary
bladder. Additionally, there is an apparent 3.0 x 2.8 x 2.6 cm
peripherally enhancing nodule involving the dome of the prostate.
This finding is associated with pathologically enlarged left common
iliac chain lymph node, constellation of findings worrisome for
prostate cancer.

Other: Small bilateral mesenteric fat containing inguinal hernias.

Musculoskeletal: No acute or aggressive osseous abnormalities.
Stable sequela of L4-L5 paraspinal fusion intervertebral disc space
replacement without evidence of hardware failure or loosening. Note
is again made of partial lumbarization of the S1 vertebral body.

Review of the MIP images confirms the above findings.
IMPRESSION: Chest CTA Impression:

1. Stable fusiform aneurysmal dilatation of the ascending thoracic
aorta measuring 48 mm in diameter. Aortic aneurysm NOS
([id number].J).
2. Stable sequela of overlapping stent graft repair of descending
thoracic aortic dissection with similar findings of a suspected
small type 2 endoleak involving the proximal aspect of the
descending thoracic aorta without enlargement of the caliber of the
native thoracic aorta at this location. The suspected type 2
endoleak is likely supplied via a combination of the superior
intercostal and bronchial arteries.
3. Coronary calcifications.  Aortic Atherosclerosis ([id number].T).

Abdomen and pelvic CTA impression

1. Interval increase in size of aneurysmal dilatation of the
infrarenal abdominal aortic aneurysm, currently measuring 4.5 x
x 3.9 cm, previously, 4.1 x 4.1 x 3.9 cm. No perivascular stranding.
2. Redemonstrated extension of the descending thoracic aortic
dissection to the level of the distal abdominal aorta, again with
extension to through the main trunk of the SMA though not resulting
in a hemodynamically significant stenosis.

Nonvascular Impression:

1. Findings worrisome for metastatic prostate cancer with marked
prostatomegaly (measuring at least 7.7 cm) with suspected 3.0 cm
peripherally enhancing nodule about the dome of the prostate,
apparent pathologic enlargement of the left common iliac chain lymph
node as well as an approximately 1.3 cm sclerotic lesion within the
T8 vertebral body. If not recently performed, further evaluation
with MANZANA and the acquisition of PSA level is advised. Additionally,
further evaluation with pelvic and/or thoracic spine
contrast-enhanced MRI could be performed as indicated.

These results will be called to the ordering clinician or
representative by the Radiologist Assistant, and communication
documented in the PACS or [REDACTED].

## 2021-06-23 DIAGNOSIS — E118 Type 2 diabetes mellitus with unspecified complications: Secondary | ICD-10-CM | POA: Diagnosis not present

## 2021-06-23 DIAGNOSIS — K219 Gastro-esophageal reflux disease without esophagitis: Secondary | ICD-10-CM | POA: Diagnosis not present

## 2021-06-23 DIAGNOSIS — E782 Mixed hyperlipidemia: Secondary | ICD-10-CM | POA: Diagnosis not present

## 2021-06-23 DIAGNOSIS — E1122 Type 2 diabetes mellitus with diabetic chronic kidney disease: Secondary | ICD-10-CM | POA: Diagnosis not present

## 2021-06-23 DIAGNOSIS — N183 Chronic kidney disease, stage 3 unspecified: Secondary | ICD-10-CM | POA: Diagnosis not present

## 2021-06-23 DIAGNOSIS — I10 Essential (primary) hypertension: Secondary | ICD-10-CM | POA: Diagnosis not present

## 2021-06-27 ENCOUNTER — Ambulatory Visit: Payer: Medicare Other | Admitting: Podiatry

## 2021-06-27 ENCOUNTER — Encounter: Payer: Self-pay | Admitting: Podiatry

## 2021-06-27 ENCOUNTER — Other Ambulatory Visit: Payer: Self-pay

## 2021-06-27 DIAGNOSIS — E1140 Type 2 diabetes mellitus with diabetic neuropathy, unspecified: Secondary | ICD-10-CM | POA: Diagnosis not present

## 2021-06-27 DIAGNOSIS — N183 Chronic kidney disease, stage 3 unspecified: Secondary | ICD-10-CM

## 2021-06-27 DIAGNOSIS — B351 Tinea unguium: Secondary | ICD-10-CM

## 2021-06-27 DIAGNOSIS — M79674 Pain in right toe(s): Secondary | ICD-10-CM | POA: Diagnosis not present

## 2021-06-27 DIAGNOSIS — M79675 Pain in left toe(s): Secondary | ICD-10-CM

## 2021-06-27 NOTE — Progress Notes (Signed)
This patient returns to my office for at risk foot care.  This patient requires this care by a professional since this patient will be at risk due to having acute kidney disease ,DM and CKD. He presents to the office with his wife.  This patient is unable to cut nails himself since the patient cannot reach his nails.These nails are painful walking and wearing shoes.  This patient presents for at risk foot care today.  General Appearance  Alert, conversant and in no acute stress.  Vascular  Dorsalis pedis and posterior tibial  pulses are palpable  bilaterally.  Capillary return is within normal limits  bilaterally. Temperature is within normal limits  bilaterally.  Neurologic  Senn-Weinstein monofilament wire test diminished   bilaterally. Muscle power within normal limits bilaterally.  Nails Thick disfigured discolored nails with subungual debris  from hallux to fifth toes bilaterally. No evidence of bacterial infection or drainage bilaterally.  Orthopedic  No limitations of motion  feet .  No crepitus or effusions noted.  HAV  B/L.  Hammer toes 2-5  B/L.  Skin  normotropic skin with no porokeratosis noted bilaterally.  No signs of infections or ulcers noted.     Onychomycosis  Pain in right toes  Pain in left toes  Consent was obtained for treatment procedures.   Mechanical debridement of nails 1-5  bilaterally performed with a nail nipper.  Filed with dremel without incident.    Return office visit   3 months                   Told patient to return for periodic foot care and evaluation due to potential at risk complications.   Sondra Blixt DPM  

## 2021-07-19 ENCOUNTER — Encounter: Payer: Self-pay | Admitting: Adult Health

## 2021-07-19 ENCOUNTER — Ambulatory Visit: Payer: Medicare Other | Admitting: Adult Health

## 2021-07-19 ENCOUNTER — Other Ambulatory Visit: Payer: Self-pay

## 2021-07-19 VITALS — BP 133/80 | HR 61 | Ht 71.0 in | Wt 209.0 lb

## 2021-07-19 DIAGNOSIS — F0150 Vascular dementia without behavioral disturbance: Secondary | ICD-10-CM

## 2021-07-19 DIAGNOSIS — Z8673 Personal history of transient ischemic attack (TIA), and cerebral infarction without residual deficits: Secondary | ICD-10-CM

## 2021-07-19 DIAGNOSIS — H532 Diplopia: Secondary | ICD-10-CM | POA: Diagnosis not present

## 2021-07-19 DIAGNOSIS — R269 Unspecified abnormalities of gait and mobility: Secondary | ICD-10-CM

## 2021-07-19 MED ORDER — MEMANTINE HCL 10 MG PO TABS: 10.0000 mg | ORAL_TABLET | Freq: Two times a day (BID) | ORAL | 3 refills | Status: DC

## 2021-07-19 NOTE — Patient Instructions (Signed)
Continue aspirin 81 mg daily  and Crestor  for secondary stroke prevention  Continue to follow up with PCP regarding cholesterol and blood pressure management  Maintain strict control of hypertension with blood pressure goal below 130/90 and cholesterol with LDL cholesterol (bad cholesterol) goal below 70 mg/dL.   Continue Namenda 10mg  twice daily   Please let me know if you would like to pursue being seen by a neuro- ophthalmology      Followup in the future with me in 1 year or call earlier if needed       Thank you for coming to see [place] at [facility]. I hope we have been able to provide you high quality care today.  You may receive a patient satisfaction survey over the next few weeks. We would appreciate your feedback and comments so that we may continue to improve ourselves and the health of our patients.

## 2021-07-19 NOTE — Progress Notes (Signed)
[facility] [address] [place]. [place] [address] [phone]       OFFICE FOLLOW-UP NOTE  [name] [name] Date of Birth:  1945/08/27 Medical Record Number:  [id number]    Chief Complaint  Patient presents with   Follow-up    Rm 3 with spouse louise  Pt is well and stable, no new concerns        HPI:   Update 07/19/2021 JM: returns for 6 month stroke follow up accompanied by his wife.  Overall stable -denies new stroke/TIA symptoms Reports cognition has been stable with some improvement noted - MMSE today 23/30 (prior 16/30) Started on Namenda 10mg  twice daily after prior visit - denies side effects  Does report double vision since his stroke which has not worsened - was seen by his ophthalmology without any concerning findings - initially after wearing stroke was wearing eye patch which would help on occasion   Compliant on aspirin and Crestor -denies side effects Blood pressure today 133/80 - monitors at home which has been stable  No new concerns at this time   History provided for reference purposes only Update 01/11/2021 JM: [name] [name] returns for follow-up after being seen 3 months ago with concerns of cognitive decline and behavioral changes as well as prior stroke history.  Work-up for underlying causes unremarkable including MRI and lab work.  EEG not yet completed. Per wife, improvement of mood and currently at baseline.  Cognition has been stable without worsening although MMSE today 16/30 (prior 19/30).  He questions possibility of returning to driving.  He admits to remaining sedentary with limited to no physical activity and no mental stimulation.  No further behavioral concerns.  History of stroke without new stroke/TIA symptoms.  Compliant on aspirin and Crestor without associated side effects.  Recent lipid panel and A1c satisfactory.  Blood pressure today 142/93 on multiple antihypertensives.  Update 11/14/2020 JM: [name] [name] is being  seen for acute visit by request of wife due to worsening cognition and behavioral changes.  Majority of history and information obtained by wife.  Cognition has been slowly declining since prior visit approximately 4 months ago.  Denies any recent or acute changes He has been having difficulty with short-term memory, processing of information and confusion which can worsen in the evening time.  Becomes agitated quickly and will curse which is not like him per wife.  Denies aggressiveness, hallucinations, paranoia or delusions Sleeps well at night although frequent nocturia.  Denies snoring or daytime fatigue Admits to sedentary lifestyle with no routine physical activity but will accompany wife to visits with family, grocery store, going out for breakfast and doctor's appointments Previously working with SLP but since completed and does not do exercises as recommended or any type of memory exercises Able to maintain ADLs with only some assistance by wife due to balanice difficulties. He does not do any type of cleaning, cooking or bill paying.  He does not drive.  Wife is his primary caregiver and provides 24/7 supervision  Compliant on aspirin and Crestor -denies associated side effects.  No recent lipid panel Blood pressure today 163/83 on carvedilol, amlodipine, and hydralazineper PCP.  Similar reading on recheck  No further concerns at this time  Update 07/11/2020 JM: [name] [name] is being seen for 76-month stroke follow-up accompanied by his wife.  Reports residual occasional imbalance and cognitive impairment with improvement since prior visit.  He has since completed therapies but continues to do exercises at home as recommended.  He questions potential return to driving.  Denies new stroke/TIA symptoms.  Remains on aspirin and Crestor for secondary stroke prevention of side effects.  Blood pressure today 176/98 and similar on recheck.   Occasionally monitors at home typically SBP 150s per wife.   No further concerns at this time.  Initial visit 01/07/2020 Dr. Leonie Man: [name] [name] is a 75 year old pleasant African-American male seen today for initial office follow-up visit following hospital consultation for stroke in February 2021.  Is accompanied by his wife.  History is obtained from them, review of electronic medical records and I personally reviewed imaging films in PACS.  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  He developed altered mental status and weakness in 10/17/2019 and CT scan showed hypodensities in right cerebellum and right occipital lobe suspicious for infarcts.  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  MRA of the head showed chronic moderate left P1 stenosis.  Carotid Dopplers were unremarkable.  2D echo showed normal ejection fraction.  LDL cholesterol was 72 mg percent hemoglobin A1c was 6.7.  Patient was initially started on IV heparin and was planned for being discharged on Eliquis and went to inpatient rehab.  However his medication list today states that he is on aspirin 81 mg only and patient is unable to tell me the reason for this.  He is currently getting home physical and occupational therapy which she has just finished but outpatient therapy has not yet started.  He walks with a wheeled walker but feels his balance is off and tends to fall backwards and has poor truncal balance.  He can walk with a walker short distances well.  He has had no falls or injuries.  He just had follow-up visit with Dr. Letta Pate today.  He does complain of mild memory difficulties and cognitive impairment since his stroke.  This is not progressive.   ROS:   14 system review of systems is positive for those  listed in HPI and all other systems negative PMH:  Past Medical History:  Diagnosis Date   Chronic back pain    stenosis   Enlarged prostate    GERD (gastroesophageal reflux disease)    occasionally will take a zantac(maybe once a month)   History of colon polyps    History of kidney stones    History of stress test    done 10 yrs. ago, as a baseline    Hyperlipidemia    takes Crestor daily   Hypertension    takes Amlodipine daily and Lotensin as well   Stroke [facility])     Social History:  Social History   Socioeconomic History   Marital status: Married    Spouse name: Not on file   Number of children: Not on file   Years of education: Not on file   Highest education level: Not on file  Occupational History   Not on file  Tobacco Use   Smoking status: Former    Packs/day: 0.25    Years: 40.00    Pack years: 10.00    Types: Cigarettes   Smokeless tobacco: Never   Tobacco comments:    none since 06/24/13  Vaping Use   Vaping Use: Never used  Substance and Sexual Activity   Alcohol use:  No   Drug use: No   Sexual activity: Not on file  Other Topics Concern   Not on file  Social History Narrative   Not on file   Social Determinants of Health   Financial Resource Strain: Not on file  Food Insecurity: Not on file  Transportation Needs: Not on file  Physical Activity: Not on file  Stress: Not on file  Social Connections: Not on file  Intimate Partner Violence: Not on file    Medications:   Current Outpatient Medications on File Prior to Visit  Medication Sig Dispense Refill   acetaminophen (TYLENOL) 500 MG tablet Take 1,000 mg by mouth every 6 (six) hours as needed for headache (pain).     acyclovir (ZOVIRAX) 400 MG tablet 1 tablet     amLODipine (NORVASC) 2.5 MG tablet Take 2.5 mg by mouth daily.      Ascorbic Acid (VITAMIN C) 500 MG CAPS Take 500 mg by mouth daily.      aspirin EC 81 MG tablet Take 81 mg by mouth daily.     Calcium Carbonate-Vitamin D  (CALCIUM-D PO) Take 1 tablet by mouth daily.     carvedilol (COREG) 25 MG tablet Take 1 tablet (25 mg total) by mouth 2 (two) times daily with a meal. 60 tablet 0   Cyanocobalamin (VITAMIN B-12 PO) Take 1 tablet by mouth daily.     hydrALAZINE (APRESOLINE) 10 MG tablet Take 1 tablet (10 mg total) by mouth every 6 (six) hours. 180 tablet 0   magnesium citrate SOLN Take 1 Bottle by mouth daily as needed for severe constipation.      memantine (NAMENDA TITRATION PAK) tablet pack 5 mg/day for =1 week; 5 mg twice daily for =1 week; 15 mg/day given in 5 mg and 10 mg separated doses for =1 week; then 10 mg twice daily 49 tablet 0   memantine (NAMENDA) 10 MG tablet Take 1 tablet (10 mg total) by mouth 2 (two) times daily. 60 tablet 5   metFORMIN (GLUCOPHAGE) 500 MG tablet Take 500 mg by mouth 2 (two) times daily.     Multiple Vitamin (MULTIVITAMIN WITH MINERALS) TABS tablet Take 1 tablet by mouth daily.     Multiple Vitamins-Minerals (MULTI VITAMIN/MINERALS) TABS See admin instructions.     pantoprazole (PROTONIX) 40 MG tablet Take 1 tablet (40 mg total) by mouth daily. 30 tablet 0   rosuvastatin (CRESTOR) 10 MG tablet Take 1 tablet (10 mg total) by mouth daily. 30 tablet 0   sildenafil (VIAGRA) 100 MG tablet 1 tablet as needed     tamsulosin (FLOMAX) 0.4 MG CAPS capsule Take 1 capsule (0.4 mg total) by mouth daily. 30 capsule 0   Vitamin D, Cholecalciferol, 10 MCG (400 UNIT) CHEW 1 tablet     No current facility-administered medications on file prior to visit.    Allergies:   Allergies  Allergen Reactions   Lyrica [Pregabalin] Nausea Only and Other (See Comments)    Hallucinations and dizziness, also   Sulfa Antibiotics Other (See Comments)    Unknown reaction    Physical Exam Today's Vitals   07/19/21 1241  BP: 133/80  Pulse: 61  Weight: 209 lb (94.8 kg)  Height: 5\' 11"  (1.803 m)    Body mass index is 29.15 kg/m.   General: well developed, well nourished pleasant elderly  African-American male, seated, in no evident distress Head: head normocephalic and atraumatic.  Neck: supple with no carotid or supraclavicular bruits Cardiovascular: regular rate and rhythm, no  murmurs Musculoskeletal: no deformity Skin:  no rash/petichiae Vascular:  Normal pulses all extremities  Neurologic Exam Mental Status: Awake and fully alert. Slowed hesitant speech but no evidence of aphasia or dysarthria.  Oriented to place and time. Recent memory impaired and remote memory intact. attention span, concentration and fund of knowledge mildly diminished,. Mood and affect appropriate. MMSE - Mini Mental State Exam 07/19/2021 01/11/2021 11/14/2020  Orientation to time 4 1 2   Orientation to Place 5 4 5   Registration 3 3 3   Attention/ Calculation 2 0 0  Recall 1 0 0  Language- name 2 objects 2 2 2   Language- repeat 1 1 1   Language- follow 3 step command 3 3 3   Language- read & follow direction 1 1 1   Write a sentence 1 1 1   Copy design 0 0 1  Total score 23 16 19    Cranial Nerves: Pupils equal, briskly reactive to light. Extraocular movements full without nystagmus but evidence of OS misalignment after convergence testing. Visual fields full to confrontation. Hearing intact. Facial sensation intact. Face, tongue, palate moves normally and symmetrically.  Motor: Normal bulk and tone. Normal strength in all tested extremity muscles. Sensory.: intact to touch ,pinprick .position and vibratory sensation.  Coordination: Rapid alternating movements normal in all extremities. Finger-to-nose and heel-to-shin performed accurately bilaterally. Gait and Station: Arises from chair without difficulty. Stance is broad-based gait demonstrates mild ataxia and mild imbalance with use of cane.  Tandem walk and heel toe not attempted reflexes: 1+ and symmetric. Toes downgoing.       ASSESSMENT/PLAN: 75 year old African-American male with multiple by cerebral infarcts in February 2021 s/p aortic  graft surgery for aortic dissection with residual mild cognitive, diplopia and gait impairment.    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  Exercises and activities provided at visit  Multiple small brain infarcts:  Binocular diplopia - present since stroke -misalignment OS on exam -discussed referral to neuro-ophthalmology for further evaluation but patient wishes to consider further - advised to call office if he wishes to pursue Continue aspirin 81 mg daily  and Crestor for secondary stroke prevention.   Discussed secondary stroke prevention measures and importance of close PCP f/u for aggressive stroke risk factor management  HTN: BP goal <130/90.  Stable on hydralazine, carvedilol and amlodipine per PCP HLD: LDL goal <70.  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  A1c 6.3 (01/2021) on Metformin per PCP     Follow-up in 1 year or call earlier if needed -if remains stable, request consolidating care with PCP and follow up here as needed    CC:  Wenda Low, MD    I spent 37 minutes of face-to-face and non-face-to-face time with patient and wife.  This included previsit chart review, lab review, study review, order entry, electronic health record documentation, patient and wife education and discussion regarding history of prior strokes with residual deficits, secondary stroke prevention measures and aggressive stroke risk factor management, completion and review of MMSE and answered all other questions to patient and wife satisfaction  Frann Rider, [facility]  [facility] [address] [place], [place] [address]  Phone  [phone] Fax [phone] Note: This document was prepared with  digital dictation and possible smart Company secretary. Any transcriptional errors that result from this process are unintentional.

## 2021-08-03 DIAGNOSIS — E782 Mixed hyperlipidemia: Secondary | ICD-10-CM | POA: Diagnosis not present

## 2021-08-03 DIAGNOSIS — K219 Gastro-esophageal reflux disease without esophagitis: Secondary | ICD-10-CM | POA: Diagnosis not present

## 2021-08-03 DIAGNOSIS — I10 Essential (primary) hypertension: Secondary | ICD-10-CM | POA: Diagnosis not present

## 2021-08-03 DIAGNOSIS — E118 Type 2 diabetes mellitus with unspecified complications: Secondary | ICD-10-CM | POA: Diagnosis not present

## 2021-08-03 DIAGNOSIS — E1122 Type 2 diabetes mellitus with diabetic chronic kidney disease: Secondary | ICD-10-CM | POA: Diagnosis not present

## 2021-08-03 DIAGNOSIS — N183 Chronic kidney disease, stage 3 unspecified: Secondary | ICD-10-CM | POA: Diagnosis not present

## 2021-08-30 ENCOUNTER — Ambulatory Visit: Payer: Medicare Other | Admitting: Podiatry

## 2021-08-30 ENCOUNTER — Other Ambulatory Visit: Payer: Self-pay

## 2021-08-30 ENCOUNTER — Encounter: Payer: Self-pay | Admitting: Podiatry

## 2021-08-30 DIAGNOSIS — M79675 Pain in left toe(s): Secondary | ICD-10-CM | POA: Diagnosis not present

## 2021-08-30 DIAGNOSIS — M79674 Pain in right toe(s): Secondary | ICD-10-CM

## 2021-08-30 DIAGNOSIS — E1140 Type 2 diabetes mellitus with diabetic neuropathy, unspecified: Secondary | ICD-10-CM

## 2021-08-30 DIAGNOSIS — B351 Tinea unguium: Secondary | ICD-10-CM | POA: Diagnosis not present

## 2021-08-30 DIAGNOSIS — N183 Chronic kidney disease, stage 3 unspecified: Secondary | ICD-10-CM | POA: Diagnosis not present

## 2021-08-30 NOTE — Progress Notes (Signed)
This patient returns to my office for at risk foot care.  This patient requires this care by a professional since this patient will be at risk due to having acute kidney disease ,DM and CKD. He presents to the office with his wife.  This patient is unable to cut nails himself since the patient cannot reach his nails.These nails are painful walking and wearing shoes.  This patient presents for at risk foot care today.  General Appearance  Alert, conversant and in no acute stress.  Vascular  Dorsalis pedis and posterior tibial  pulses are palpable  bilaterally.  Capillary return is within normal limits  bilaterally. Temperature is within normal limits  bilaterally.  Neurologic  Senn-Weinstein monofilament wire test diminished   bilaterally. Muscle power within normal limits bilaterally.  Nails Thick disfigured discolored nails with subungual debris  from hallux to fifth toes bilaterally. No evidence of bacterial infection or drainage bilaterally.  Orthopedic  No limitations of motion  feet .  No crepitus or effusions noted.  HAV  B/L.  Hammer toes 2-5  B/L.  Skin  normotropic skin with no porokeratosis noted bilaterally.  No signs of infections or ulcers noted.     Onychomycosis  Pain in right toes  Pain in left toes  Consent was obtained for treatment procedures.   Mechanical debridement of nails 1-5  bilaterally performed with a nail nipper.  Filed with dremel without incident.    Return office visit   3 months                   Told patient to return for periodic foot care and evaluation due to potential at risk complications.   Jolee Critcher DPM  

## 2021-09-12 DIAGNOSIS — I10 Essential (primary) hypertension: Secondary | ICD-10-CM | POA: Diagnosis not present

## 2021-09-12 DIAGNOSIS — E782 Mixed hyperlipidemia: Secondary | ICD-10-CM | POA: Diagnosis not present

## 2021-09-12 DIAGNOSIS — I700 Atherosclerosis of aorta: Secondary | ICD-10-CM | POA: Diagnosis not present

## 2021-09-12 DIAGNOSIS — E1122 Type 2 diabetes mellitus with diabetic chronic kidney disease: Secondary | ICD-10-CM | POA: Diagnosis not present

## 2021-09-12 DIAGNOSIS — I69359 Hemiplegia and hemiparesis following cerebral infarction affecting unspecified side: Secondary | ICD-10-CM | POA: Diagnosis not present

## 2021-09-12 DIAGNOSIS — K219 Gastro-esophageal reflux disease without esophagitis: Secondary | ICD-10-CM | POA: Diagnosis not present

## 2021-09-12 DIAGNOSIS — N183 Chronic kidney disease, stage 3 unspecified: Secondary | ICD-10-CM | POA: Diagnosis not present

## 2021-09-12 DIAGNOSIS — D696 Thrombocytopenia, unspecified: Secondary | ICD-10-CM | POA: Diagnosis not present

## 2021-09-12 DIAGNOSIS — Z23 Encounter for immunization: Secondary | ICD-10-CM | POA: Diagnosis not present

## 2021-09-12 DIAGNOSIS — I639 Cerebral infarction, unspecified: Secondary | ICD-10-CM | POA: Diagnosis not present

## 2021-09-19 DIAGNOSIS — I10 Essential (primary) hypertension: Secondary | ICD-10-CM | POA: Diagnosis not present

## 2021-09-19 DIAGNOSIS — N183 Chronic kidney disease, stage 3 unspecified: Secondary | ICD-10-CM | POA: Diagnosis not present

## 2021-09-19 DIAGNOSIS — E782 Mixed hyperlipidemia: Secondary | ICD-10-CM | POA: Diagnosis not present

## 2021-09-19 DIAGNOSIS — E1122 Type 2 diabetes mellitus with diabetic chronic kidney disease: Secondary | ICD-10-CM | POA: Diagnosis not present

## 2021-09-19 DIAGNOSIS — E118 Type 2 diabetes mellitus with unspecified complications: Secondary | ICD-10-CM | POA: Diagnosis not present

## 2021-10-16 DIAGNOSIS — I10 Essential (primary) hypertension: Secondary | ICD-10-CM | POA: Diagnosis not present

## 2021-10-16 DIAGNOSIS — E1122 Type 2 diabetes mellitus with diabetic chronic kidney disease: Secondary | ICD-10-CM | POA: Diagnosis not present

## 2021-10-16 DIAGNOSIS — E782 Mixed hyperlipidemia: Secondary | ICD-10-CM | POA: Diagnosis not present

## 2021-10-22 DIAGNOSIS — E782 Mixed hyperlipidemia: Secondary | ICD-10-CM | POA: Diagnosis not present

## 2021-10-22 DIAGNOSIS — E1122 Type 2 diabetes mellitus with diabetic chronic kidney disease: Secondary | ICD-10-CM | POA: Diagnosis not present

## 2021-10-22 DIAGNOSIS — I10 Essential (primary) hypertension: Secondary | ICD-10-CM | POA: Diagnosis not present

## 2021-11-21 DIAGNOSIS — E782 Mixed hyperlipidemia: Secondary | ICD-10-CM | POA: Diagnosis not present

## 2021-11-21 DIAGNOSIS — I10 Essential (primary) hypertension: Secondary | ICD-10-CM | POA: Diagnosis not present

## 2021-11-21 DIAGNOSIS — E1122 Type 2 diabetes mellitus with diabetic chronic kidney disease: Secondary | ICD-10-CM | POA: Diagnosis not present

## 2021-11-28 ENCOUNTER — Ambulatory Visit: Payer: Medicare Other | Admitting: Podiatry

## 2021-11-28 ENCOUNTER — Encounter: Payer: Self-pay | Admitting: Podiatry

## 2021-11-28 DIAGNOSIS — M79675 Pain in left toe(s): Secondary | ICD-10-CM

## 2021-11-28 DIAGNOSIS — M21611 Bunion of right foot: Secondary | ICD-10-CM | POA: Diagnosis not present

## 2021-11-28 DIAGNOSIS — M2011 Hallux valgus (acquired), right foot: Secondary | ICD-10-CM

## 2021-11-28 DIAGNOSIS — E1140 Type 2 diabetes mellitus with diabetic neuropathy, unspecified: Secondary | ICD-10-CM

## 2021-11-28 DIAGNOSIS — M21612 Bunion of left foot: Secondary | ICD-10-CM

## 2021-11-28 DIAGNOSIS — M79674 Pain in right toe(s): Secondary | ICD-10-CM

## 2021-11-28 DIAGNOSIS — B351 Tinea unguium: Secondary | ICD-10-CM | POA: Diagnosis not present

## 2021-11-28 DIAGNOSIS — M2012 Hallux valgus (acquired), left foot: Secondary | ICD-10-CM | POA: Diagnosis not present

## 2021-11-28 DIAGNOSIS — N183 Chronic kidney disease, stage 3 unspecified: Secondary | ICD-10-CM

## 2021-11-28 NOTE — Progress Notes (Signed)
This patient returns to my office for at risk foot care.  This patient requires this care by a professional since this patient will be at risk due to having acute kidney disease ,DM and CKD. He presents to the office with his wife.  This patient is unable to cut nails himself since the patient cannot reach his nails.These nails are painful walking and wearing shoes.  This patient presents for at risk foot care today.  General Appearance  Alert, conversant and in no acute stress.  Vascular  Dorsalis pedis and posterior tibial  pulses are palpable  bilaterally.  Capillary return is within normal limits  bilaterally. Temperature is within normal limits  bilaterally.  Neurologic  Senn-Weinstein monofilament wire test diminished   bilaterally. Muscle power within normal limits bilaterally.  Nails Thick disfigured discolored nails with subungual debris  from hallux to fifth toes bilaterally. No evidence of bacterial infection or drainage bilaterally.  Orthopedic  No limitations of motion  feet .  No crepitus or effusions noted.  HAV  B/L.  Hammer toes 2-5  B/L.  Skin  normotropic skin with no porokeratosis noted bilaterally.  No signs of infections or ulcers noted.     Onychomycosis  Pain in right toes  Pain in left toes  Consent was obtained for treatment procedures.   Mechanical debridement of nails 1-5  bilaterally performed with a nail nipper.  Filed with dremel without incident.    Return office visit   3 months                   Told patient to return for periodic foot care and evaluation due to potential at risk complications.   Rekita Miotke DPM  

## 2021-12-20 DIAGNOSIS — E1122 Type 2 diabetes mellitus with diabetic chronic kidney disease: Secondary | ICD-10-CM | POA: Diagnosis not present

## 2021-12-20 DIAGNOSIS — I10 Essential (primary) hypertension: Secondary | ICD-10-CM | POA: Diagnosis not present

## 2021-12-20 DIAGNOSIS — E782 Mixed hyperlipidemia: Secondary | ICD-10-CM | POA: Diagnosis not present

## 2022-02-15 DIAGNOSIS — E1122 Type 2 diabetes mellitus with diabetic chronic kidney disease: Secondary | ICD-10-CM | POA: Diagnosis not present

## 2022-02-15 DIAGNOSIS — K219 Gastro-esophageal reflux disease without esophagitis: Secondary | ICD-10-CM | POA: Diagnosis not present

## 2022-02-15 DIAGNOSIS — I10 Essential (primary) hypertension: Secondary | ICD-10-CM | POA: Diagnosis not present

## 2022-02-15 DIAGNOSIS — E782 Mixed hyperlipidemia: Secondary | ICD-10-CM | POA: Diagnosis not present

## 2022-02-19 DIAGNOSIS — E1122 Type 2 diabetes mellitus with diabetic chronic kidney disease: Secondary | ICD-10-CM | POA: Diagnosis not present

## 2022-02-19 DIAGNOSIS — E782 Mixed hyperlipidemia: Secondary | ICD-10-CM | POA: Diagnosis not present

## 2022-02-19 DIAGNOSIS — Z0000 Encounter for general adult medical examination without abnormal findings: Secondary | ICD-10-CM | POA: Diagnosis not present

## 2022-02-19 DIAGNOSIS — D696 Thrombocytopenia, unspecified: Secondary | ICD-10-CM | POA: Diagnosis not present

## 2022-02-19 DIAGNOSIS — N183 Chronic kidney disease, stage 3 unspecified: Secondary | ICD-10-CM | POA: Diagnosis not present

## 2022-02-19 DIAGNOSIS — I700 Atherosclerosis of aorta: Secondary | ICD-10-CM | POA: Diagnosis not present

## 2022-02-19 DIAGNOSIS — I639 Cerebral infarction, unspecified: Secondary | ICD-10-CM | POA: Diagnosis not present

## 2022-02-19 DIAGNOSIS — I10 Essential (primary) hypertension: Secondary | ICD-10-CM | POA: Diagnosis not present

## 2022-02-19 DIAGNOSIS — I69359 Hemiplegia and hemiparesis following cerebral infarction affecting unspecified side: Secondary | ICD-10-CM | POA: Diagnosis not present

## 2022-02-27 ENCOUNTER — Ambulatory Visit: Payer: Medicare Other | Admitting: Podiatry

## 2022-02-27 ENCOUNTER — Encounter: Payer: Self-pay | Admitting: Podiatry

## 2022-02-27 DIAGNOSIS — M21612 Bunion of left foot: Secondary | ICD-10-CM

## 2022-02-27 DIAGNOSIS — B351 Tinea unguium: Secondary | ICD-10-CM

## 2022-02-27 DIAGNOSIS — N183 Chronic kidney disease, stage 3 unspecified: Secondary | ICD-10-CM

## 2022-02-27 DIAGNOSIS — M2012 Hallux valgus (acquired), left foot: Secondary | ICD-10-CM

## 2022-02-27 DIAGNOSIS — M79675 Pain in left toe(s): Secondary | ICD-10-CM

## 2022-02-27 DIAGNOSIS — M79674 Pain in right toe(s): Secondary | ICD-10-CM

## 2022-02-27 DIAGNOSIS — M21611 Bunion of right foot: Secondary | ICD-10-CM

## 2022-02-27 DIAGNOSIS — M2011 Hallux valgus (acquired), right foot: Secondary | ICD-10-CM

## 2022-02-27 DIAGNOSIS — E1140 Type 2 diabetes mellitus with diabetic neuropathy, unspecified: Secondary | ICD-10-CM

## 2022-02-27 NOTE — Progress Notes (Signed)
This patient returns to my office for at risk foot care.  This patient requires this care by a professional since this patient will be at risk due to having acute kidney disease ,DM and CKD. He presents to the office with his wife.  This patient is unable to cut nails himself since the patient cannot reach his nails.These nails are painful walking and wearing shoes.  This patient presents for at risk foot care today.  General Appearance  Alert, conversant and in no acute stress.  Vascular  Dorsalis pedis and posterior tibial  pulses are palpable  bilaterally.  Capillary return is within normal limits  bilaterally. Temperature is within normal limits  bilaterally.  Neurologic  Senn-Weinstein monofilament wire test diminished   bilaterally. Muscle power within normal limits bilaterally.  Nails Thick disfigured discolored nails with subungual debris  from hallux to fifth toes bilaterally. No evidence of bacterial infection or drainage bilaterally.  Orthopedic  No limitations of motion  feet .  No crepitus or effusions noted.  HAV  B/L.  Hammer toes 2-5  B/L.  Skin  normotropic skin with no porokeratosis noted bilaterally.  No signs of infections or ulcers noted.     Onychomycosis  Pain in right toes  Pain in left toes  Consent was obtained for treatment procedures.   Mechanical debridement of nails 1-5  bilaterally performed with a nail nipper.  Filed with dremel without incident.    Return office visit   3 months                   Told patient to return for periodic foot care and evaluation due to potential at risk complications.   Ancel Easler DPM  

## 2022-03-19 DIAGNOSIS — N183 Chronic kidney disease, stage 3 unspecified: Secondary | ICD-10-CM | POA: Diagnosis not present

## 2022-03-19 DIAGNOSIS — E782 Mixed hyperlipidemia: Secondary | ICD-10-CM | POA: Diagnosis not present

## 2022-03-19 DIAGNOSIS — I10 Essential (primary) hypertension: Secondary | ICD-10-CM | POA: Diagnosis not present

## 2022-03-19 DIAGNOSIS — E1122 Type 2 diabetes mellitus with diabetic chronic kidney disease: Secondary | ICD-10-CM | POA: Diagnosis not present

## 2022-03-19 DIAGNOSIS — K219 Gastro-esophageal reflux disease without esophagitis: Secondary | ICD-10-CM | POA: Diagnosis not present

## 2022-04-17 DIAGNOSIS — E782 Mixed hyperlipidemia: Secondary | ICD-10-CM | POA: Diagnosis not present

## 2022-04-17 DIAGNOSIS — K219 Gastro-esophageal reflux disease without esophagitis: Secondary | ICD-10-CM | POA: Diagnosis not present

## 2022-04-17 DIAGNOSIS — I10 Essential (primary) hypertension: Secondary | ICD-10-CM | POA: Diagnosis not present

## 2022-04-17 DIAGNOSIS — E1122 Type 2 diabetes mellitus with diabetic chronic kidney disease: Secondary | ICD-10-CM | POA: Diagnosis not present

## 2022-06-04 ENCOUNTER — Encounter: Payer: Self-pay | Admitting: Podiatry

## 2022-06-04 ENCOUNTER — Ambulatory Visit: Payer: Medicare Other | Admitting: Podiatry

## 2022-06-04 DIAGNOSIS — N183 Chronic kidney disease, stage 3 unspecified: Secondary | ICD-10-CM | POA: Diagnosis not present

## 2022-06-04 DIAGNOSIS — M79675 Pain in left toe(s): Secondary | ICD-10-CM | POA: Diagnosis not present

## 2022-06-04 DIAGNOSIS — M79674 Pain in right toe(s): Secondary | ICD-10-CM | POA: Diagnosis not present

## 2022-06-04 DIAGNOSIS — E1140 Type 2 diabetes mellitus with diabetic neuropathy, unspecified: Secondary | ICD-10-CM | POA: Diagnosis not present

## 2022-06-04 DIAGNOSIS — M21611 Bunion of right foot: Secondary | ICD-10-CM | POA: Diagnosis not present

## 2022-06-04 DIAGNOSIS — B351 Tinea unguium: Secondary | ICD-10-CM | POA: Diagnosis not present

## 2022-06-04 DIAGNOSIS — M2011 Hallux valgus (acquired), right foot: Secondary | ICD-10-CM | POA: Diagnosis not present

## 2022-06-04 DIAGNOSIS — M2012 Hallux valgus (acquired), left foot: Secondary | ICD-10-CM

## 2022-06-04 DIAGNOSIS — M21612 Bunion of left foot: Secondary | ICD-10-CM

## 2022-06-04 NOTE — Progress Notes (Signed)
This patient returns to my office for at risk foot care.  This patient requires this care by a professional since this patient will be at risk due to having acute kidney disease ,DM and CKD. He presents to the office with his wife.  This patient is unable to cut nails himself since the patient cannot reach his nails.These nails are painful walking and wearing shoes.  This patient presents for at risk foot care today.  General Appearance  Alert, conversant and in no acute stress.  Vascular  Dorsalis pedis and posterior tibial  pulses are palpable  bilaterally.  Capillary return is within normal limits  bilaterally. Temperature is within normal limits  bilaterally.  Neurologic  Senn-Weinstein monofilament wire test diminished   bilaterally. Muscle power within normal limits bilaterally.  Nails Thick disfigured discolored nails with subungual debris  from hallux to fifth toes bilaterally. No evidence of bacterial infection or drainage bilaterally.  Orthopedic  No limitations of motion  feet .  No crepitus or effusions noted.  HAV  B/L.  Hammer toes 2-5  B/L.  Skin  normotropic skin with no porokeratosis noted bilaterally.  No signs of infections or ulcers noted.     Onychomycosis  Pain in right toes  Pain in left toes  Consent was obtained for treatment procedures.   Mechanical debridement of nails 1-5  bilaterally performed with a nail nipper.  Filed with dremel without incident.    Return office visit   3 months                   Told patient to return for periodic foot care and evaluation due to potential at risk complications.   Jillene Wehrenberg DPM  

## 2022-06-18 DIAGNOSIS — E1122 Type 2 diabetes mellitus with diabetic chronic kidney disease: Secondary | ICD-10-CM | POA: Diagnosis not present

## 2022-06-18 DIAGNOSIS — K219 Gastro-esophageal reflux disease without esophagitis: Secondary | ICD-10-CM | POA: Diagnosis not present

## 2022-06-18 DIAGNOSIS — I10 Essential (primary) hypertension: Secondary | ICD-10-CM | POA: Diagnosis not present

## 2022-06-18 DIAGNOSIS — E782 Mixed hyperlipidemia: Secondary | ICD-10-CM | POA: Diagnosis not present

## 2022-07-12 ENCOUNTER — Other Ambulatory Visit: Payer: Self-pay | Admitting: Adult Health

## 2022-07-12 DIAGNOSIS — F0150 Vascular dementia without behavioral disturbance: Secondary | ICD-10-CM

## 2022-07-16 DIAGNOSIS — E782 Mixed hyperlipidemia: Secondary | ICD-10-CM | POA: Diagnosis not present

## 2022-07-16 DIAGNOSIS — K219 Gastro-esophageal reflux disease without esophagitis: Secondary | ICD-10-CM | POA: Diagnosis not present

## 2022-07-16 DIAGNOSIS — E1122 Type 2 diabetes mellitus with diabetic chronic kidney disease: Secondary | ICD-10-CM | POA: Diagnosis not present

## 2022-07-16 DIAGNOSIS — N183 Chronic kidney disease, stage 3 unspecified: Secondary | ICD-10-CM | POA: Diagnosis not present

## 2022-07-16 DIAGNOSIS — I10 Essential (primary) hypertension: Secondary | ICD-10-CM | POA: Diagnosis not present

## 2022-07-18 DIAGNOSIS — E119 Type 2 diabetes mellitus without complications: Secondary | ICD-10-CM | POA: Diagnosis not present

## 2022-07-18 NOTE — Telephone Encounter (Signed)
Appt tomorrow 07/19/22

## 2022-07-19 ENCOUNTER — Ambulatory Visit: Payer: Medicare Other | Admitting: Adult Health

## 2022-07-19 ENCOUNTER — Encounter: Payer: Self-pay | Admitting: Adult Health

## 2022-07-19 VITALS — BP 134/77 | HR 55 | Ht 71.0 in | Wt 215.0 lb

## 2022-07-19 DIAGNOSIS — F0150 Vascular dementia without behavioral disturbance: Secondary | ICD-10-CM

## 2022-07-19 DIAGNOSIS — Z8673 Personal history of transient ischemic attack (TIA), and cerebral infarction without residual deficits: Secondary | ICD-10-CM | POA: Diagnosis not present

## 2022-07-19 MED ORDER — MEMANTINE HCL 10 MG PO TABS: 10.0000 mg | ORAL_TABLET | Freq: Two times a day (BID) | ORAL | 3 refills | Status: DC

## 2022-07-19 NOTE — Progress Notes (Signed)
[facility] [address] [place]. [place] [address] [phone]       OFFICE FOLLOW-UP NOTE  [name] [name] Date of Birth:  05-Mar-1946 Medical Record Number:  [id number]    Chief Complaint  Patient presents with   Follow-up    RM 3 with spouse [name] [name] is well and stable, no new concerns       HPI:   Update 07/19/2022 JM: Patient returns for yearly stroke follow-up accompanied by his wife.  Overall stable without new stroke/TIA symptoms. Reports cognition has improved since prior visit MMSE today 29/30 (prior 23/20) Has remained on Namenda 10 mg twice daily, also reports taking Prevagen extra strength  Compliant on aspirin and Crestor Blood pressure well controlled Routinely follows with PCP Dr. Lysle Rubens      History provided for reference purposes only Update 07/19/2021 JM: returns for 6 month stroke follow up accompanied by his wife.  Overall stable -denies new stroke/TIA symptoms Reports cognition has been stable with some improvement noted - MMSE today 23/30 (prior 16/30) Started on Namenda '10mg'$  twice daily after prior visit - denies side effects  Does report double vision since his stroke which has not worsened - was seen by his ophthalmology without any concerning findings - initially after wearing stroke was wearing eye patch which would help on occasion   Compliant on aspirin and Crestor -denies side effects Blood pressure today 133/80 - monitors at home which has been stable  No new concerns at this time  Update 01/11/2021 JM: [name] [name] returns for follow-up after being seen 3 months ago with concerns of cognitive decline and behavioral changes as well as prior stroke history.  Work-up for underlying causes unremarkable including MRI and lab work.  EEG not yet completed. Per wife, improvement of mood and currently at baseline.  Cognition has been stable without worsening although MMSE today 16/30 (prior 19/30).  He questions  possibility of returning to driving.  He admits to remaining sedentary with limited to no physical activity and no mental stimulation.  No further behavioral concerns.  History of stroke without new stroke/TIA symptoms.  Compliant on aspirin and Crestor without associated side effects.  Recent lipid panel and A1c satisfactory.  Blood pressure today 142/93 on multiple antihypertensives.  Update 11/14/2020 JM: [name] [name] is being seen for acute visit by request of wife due to worsening cognition and behavioral changes.  Majority of history and information obtained by wife.  Cognition has been slowly declining since prior visit approximately 4 months ago.  Denies any recent or acute changes He has been having difficulty with short-term memory, processing of information and confusion which can worsen in the evening time.  Becomes agitated quickly and will curse which is not like him per wife.  Denies aggressiveness, hallucinations, paranoia or delusions Sleeps well at night although frequent nocturia.  Denies snoring or daytime fatigue Admits to sedentary lifestyle with no routine physical activity but will accompany wife to visits with family, grocery store, going out for breakfast and doctor's appointments Previously working with SLP but since completed and does not do exercises as recommended or any type of memory exercises Able to maintain ADLs with only some assistance by wife due to balanice difficulties. He does not do any type of cleaning, cooking or bill paying.  He does not drive.  Wife is his primary caregiver and provides 24/7 supervision  Compliant on aspirin and Crestor -denies associated side effects.  No recent lipid panel Blood pressure today  163/83 on carvedilol, amlodipine, and hydralazineper PCP.  Similar reading on recheck  No further concerns at this time  Update 07/11/2020 JM: [name] [name] is being seen for 77-monthstroke follow-up accompanied by his wife.  Reports residual  occasional imbalance and cognitive impairment with improvement since prior visit.  He has since completed therapies but continues to do exercises at home as recommended.  He questions potential return to driving.  Denies new stroke/TIA symptoms.  Remains on aspirin and Crestor for secondary stroke prevention of side effects.  Blood pressure today 176/98 and similar on recheck.   Occasionally monitors at home typically SBP 150s per wife.  No further concerns at this time.  Initial visit 01/07/2020 Dr. SLeonie Man Mr. PVisseris a 76-year old pleasant African-American male seen today for initial office follow-up visit following hospital consultation for stroke in February 2021.  Is accompanied by his wife.  History is obtained from them, review of electronic medical records and I personally reviewed imaging films in PACS.  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  He developed altered mental status and weakness in 10/17/2019 and CT scan showed hypodensities in right cerebellum and right occipital lobe suspicious for infarcts.  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  MRA of the head showed chronic moderate left P1 stenosis.  Carotid Dopplers were unremarkable.  2D echo showed normal ejection fraction.  LDL cholesterol was 72 mg percent hemoglobin A1c was 6.7.  Patient was initially started on IV heparin and was planned for being discharged on Eliquis and went to inpatient rehab.  However his medication list today states that he is on aspirin 81 mg only and patient is unable to tell me the reason for this.  He is currently getting home physical and occupational therapy which she has just finished but outpatient therapy has not yet  started.  He walks with a wheeled walker but feels his balance is off and tends to fall backwards and has poor truncal balance.  He can walk with a walker short distances well.  He has had no falls or injuries.  He just had follow-up visit with Dr. KLetta Patetoday.  He does complain of mild memory difficulties and cognitive impairment since his stroke.  This is not progressive.   ROS:   14 system review of systems is positive for those listed in HPI and all other systems negative PMH:  Past Medical History:  Diagnosis Date   Chronic back pain    stenosis   Enlarged prostate    GERD (gastroesophageal reflux disease)    occasionally will take a zantac(maybe once a month)   History of colon polyps    History of kidney stones    History of stress test    done 10 yrs. ago, as a baseline    Hyperlipidemia    takes Crestor daily   Hypertension    takes Amlodipine daily and Lotensin as well   Stroke ([facility]     Social History:  Social History   Socioeconomic History   Marital status: Married    Spouse name: Not on file   Number of children: Not on file   Years of education: Not on file   Highest education level: Not on file  Occupational History  Not on file  Tobacco Use   Smoking status: Former    Packs/day: 0.25    Years: 40.00    Total pack years: 10.00    Types: Cigarettes   Smokeless tobacco: Never   Tobacco comments:    none since 06/24/13  Vaping Use   Vaping Use: Never used  Substance and Sexual Activity   Alcohol use: No   Drug use: No   Sexual activity: Not on file  Other Topics Concern   Not on file  Social History Narrative   Not on file   Social Determinants of Health   Financial Resource Strain: Not on file  Food Insecurity: Not on file  Transportation Needs: Not on file  Physical Activity: Not on file  Stress: Not on file  Social Connections: Not on file  Intimate Partner Violence: Not on file    Medications:   Current Outpatient Medications on  File Prior to Visit  Medication Sig Dispense Refill   acetaminophen (TYLENOL) 500 MG tablet Take 1,000 mg by mouth every 6 (six) hours as needed for headache (pain).     acyclovir (ZOVIRAX) 400 MG tablet 1 tablet     amLODipine (NORVASC) 2.5 MG tablet Take 2.5 mg by mouth daily.      Ascorbic Acid (VITAMIN C) 500 MG CAPS Take 500 mg by mouth daily.      aspirin EC 81 MG tablet Take 81 mg by mouth daily.     Calcium Carbonate-Vitamin D (CALCIUM-D PO) Take 1 tablet by mouth daily.     carvedilol (COREG) 25 MG tablet Take 1 tablet (25 mg total) by mouth 2 (two) times daily with a meal. 60 tablet 0   Cyanocobalamin (VITAMIN B-12 PO) Take 1 tablet by mouth daily.     hydrALAZINE (APRESOLINE) 10 MG tablet Take 1 tablet (10 mg total) by mouth every 6 (six) hours. 180 tablet 0   magnesium citrate SOLN Take 1 Bottle by mouth daily as needed for severe constipation.      memantine (NAMENDA) 10 MG tablet Take 1 tablet (10 mg total) by mouth 2 (two) times daily. 180 tablet 3   metFORMIN (GLUCOPHAGE) 500 MG tablet Take 500 mg by mouth 2 (two) times daily.     Multiple Vitamin (MULTIVITAMIN WITH MINERALS) TABS tablet Take 1 tablet by mouth daily.     Multiple Vitamins-Minerals (MULTI VITAMIN/MINERALS) TABS See admin instructions.     pantoprazole (PROTONIX) 40 MG tablet Take 1 tablet (40 mg total) by mouth daily. 30 tablet 0   rosuvastatin (CRESTOR) 10 MG tablet Take 1 tablet (10 mg total) by mouth daily. 30 tablet 0   sildenafil (VIAGRA) 100 MG tablet 1 tablet as needed     tamsulosin (FLOMAX) 0.4 MG CAPS capsule Take 1 capsule (0.4 mg total) by mouth daily. 30 capsule 0   Vitamin D, Cholecalciferol, 10 MCG (400 UNIT) CHEW 1 tablet     No current facility-administered medications on file prior to visit.    Allergies:   Allergies  Allergen Reactions   Lyrica [Pregabalin] Nausea Only and Other (See Comments)    Hallucinations and dizziness, also   Sulfa Antibiotics Other (See Comments)    Unknown  reaction    Physical Exam Today's Vitals   07/19/22 1507  BP: 134/77  Pulse: (!) 55  Weight: 215 lb (97.5 kg)  Height: '5\' 11"'$  (1.803 m)     Body mass index is 29.99 kg/m.   General: well developed, well nourished very pleasant elderly  African-American male, seated, in no evident distress Head: head normocephalic and atraumatic.  Neck: supple with no carotid or supraclavicular bruits Cardiovascular: regular rate and rhythm, no murmurs Musculoskeletal: no deformity Skin:  no rash/petichiae Vascular:  Normal pulses all extremities  Neurologic Exam Mental Status: Awake and fully alert. Slowed hesitant speech but no evidence of aphasia or dysarthria.  Oriented to place and time. Recent memory impaired and remote memory intact. attention span, concentration and fund of knowledge mildly diminished,. Mood and affect appropriate.    07/19/2022    3:09 PM 07/19/2021   12:44 PM 01/11/2021   12:41 PM  MMSE - Mini Mental State Exam  Orientation to time '5 4 1  '$ Orientation to Place '5 5 4  '$ Registration '3 3 3  '$ Attention/ Calculation 5 2 0  Recall 2 1 0  Language- name 2 objects '2 2 2  '$ Language- repeat '1 1 1  '$ Language- follow 3 step command '3 3 3  '$ Language- read & follow direction '1 1 1  '$ Write a sentence '1 1 1  '$ Copy design 1 0 0  Total score '29 23 16   '$ Cranial Nerves: Pupils equal, briskly reactive to light. Extraocular movements full without nystagmus but evidence of OS misalignment after convergence testing. Visual fields full to confrontation. Hearing intact. Facial sensation intact. Face, tongue, palate moves normally and symmetrically.  Motor: Normal bulk and tone. Normal strength in all tested extremity muscles. Sensory.: intact to touch ,pinprick .position and vibratory sensation.  Coordination: Rapid alternating movements normal in all extremities. Finger-to-nose and heel-to-shin performed accurately bilaterally. Gait and Station: Arises from chair without difficulty. Stance is  broad-based gait demonstrates mild ataxia and mild imbalance with use of RW.  Tandem walk and heel toe not attempted reflexes: 1+ and symmetric. Toes downgoing.       ASSESSMENT/PLAN: 76 year old African-American male with multiple by cerebral infarcts in February 2021 s/p aortic graft surgery for aortic dissection with residual mild cognitive, diplopia and gait impairment.    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  Exercises and activities provided at visit  Multiple small brain infarcts:  Continue aspirin 81 mg daily  and Crestor for secondary stroke prevention.   Discussed secondary stroke prevention measures and importance of close PCP f/u for aggressive stroke risk factor management including BP goal<130/90, HLD with LDL goal<70 and DM with A1c.<7     Follow-up in 1 year or call earlier if needed     CC:  Wenda Low, MD    I spent 34 minutes of face-to-face and non-face-to-face time with patient and wife.  This included previsit chart review, lab review, study review, order entry, electronic health record documentation, patient and wife education and discussion regarding above diagnoses and treatment plan and answered all the questions to patient's satisfaction  Frann Rider, [facility]  [facility] [address] [place], [place] [address]  Phone [phone] Fax [phone] Note: This document was prepared with digital dictation and possible smart phrase technology. Any transcriptional errors that result from this process are unintentional.

## 2022-07-19 NOTE — Patient Instructions (Signed)
Continue Namenda '10mg'$  twice daily for cognition  Continue aspirin 81 mg daily  and Crestor  for secondary stroke prevention  Continue to follow up with PCP regarding cholesterol and blood pressure management  Maintain strict control of hypertension with blood pressure goal below 130/90 and cholesterol with LDL cholesterol (bad cholesterol) goal below 70 mg/dL.   Signs of a Stroke? Follow the BEFAST method:  Balance Watch for a sudden loss of balance, trouble with coordination or vertigo Eyes Is there a sudden loss of vision in one or both eyes? Or double vision?  Face: Ask the Thoma to smile. Does one side of the face droop or is it numb?  Arms: Ask the Torbert to raise both arms. Does one arm drift downward? Is there weakness or numbness of a leg? Speech: Ask the [place] to repeat a simple phrase. Does the speech sound slurred/strange? Is the Shipes confused ? Time: If you observe any of these signs, call 911.     Followup in the future with me in 1 year or call earlier if needed       Thank you for coming to see [place] at [facility]. I hope we have been able to provide you high quality care today.  You may receive a patient satisfaction survey over the next few weeks. We would appreciate your feedback and comments so that we may continue to improve ourselves and the health of our patients.

## 2022-08-15 DIAGNOSIS — I10 Essential (primary) hypertension: Secondary | ICD-10-CM | POA: Diagnosis not present

## 2022-08-15 DIAGNOSIS — E782 Mixed hyperlipidemia: Secondary | ICD-10-CM | POA: Diagnosis not present

## 2022-08-15 DIAGNOSIS — K219 Gastro-esophageal reflux disease without esophagitis: Secondary | ICD-10-CM | POA: Diagnosis not present

## 2022-08-15 DIAGNOSIS — N183 Chronic kidney disease, stage 3 unspecified: Secondary | ICD-10-CM | POA: Diagnosis not present

## 2022-08-15 DIAGNOSIS — E1122 Type 2 diabetes mellitus with diabetic chronic kidney disease: Secondary | ICD-10-CM | POA: Diagnosis not present

## 2022-08-30 DIAGNOSIS — E1122 Type 2 diabetes mellitus with diabetic chronic kidney disease: Secondary | ICD-10-CM | POA: Diagnosis not present

## 2022-08-30 DIAGNOSIS — I10 Essential (primary) hypertension: Secondary | ICD-10-CM | POA: Diagnosis not present

## 2022-08-30 DIAGNOSIS — E782 Mixed hyperlipidemia: Secondary | ICD-10-CM | POA: Diagnosis not present

## 2022-08-30 DIAGNOSIS — I639 Cerebral infarction, unspecified: Secondary | ICD-10-CM | POA: Diagnosis not present

## 2022-08-30 DIAGNOSIS — K219 Gastro-esophageal reflux disease without esophagitis: Secondary | ICD-10-CM | POA: Diagnosis not present

## 2022-08-30 DIAGNOSIS — I700 Atherosclerosis of aorta: Secondary | ICD-10-CM | POA: Diagnosis not present

## 2022-08-30 DIAGNOSIS — D696 Thrombocytopenia, unspecified: Secondary | ICD-10-CM | POA: Diagnosis not present

## 2022-08-30 DIAGNOSIS — I69359 Hemiplegia and hemiparesis following cerebral infarction affecting unspecified side: Secondary | ICD-10-CM | POA: Diagnosis not present

## 2022-08-30 DIAGNOSIS — I69322 Dysarthria following cerebral infarction: Secondary | ICD-10-CM | POA: Diagnosis not present

## 2022-08-30 DIAGNOSIS — R269 Unspecified abnormalities of gait and mobility: Secondary | ICD-10-CM | POA: Diagnosis not present

## 2022-08-30 DIAGNOSIS — Z23 Encounter for immunization: Secondary | ICD-10-CM | POA: Diagnosis not present

## 2022-09-04 ENCOUNTER — Encounter: Payer: Self-pay | Admitting: Podiatry

## 2022-09-04 ENCOUNTER — Ambulatory Visit (INDEPENDENT_AMBULATORY_CARE_PROVIDER_SITE_OTHER): Payer: Medicare Other | Admitting: Podiatry

## 2022-09-04 DIAGNOSIS — M21611 Bunion of right foot: Secondary | ICD-10-CM | POA: Diagnosis not present

## 2022-09-04 DIAGNOSIS — B351 Tinea unguium: Secondary | ICD-10-CM

## 2022-09-04 DIAGNOSIS — M79675 Pain in left toe(s): Secondary | ICD-10-CM | POA: Diagnosis not present

## 2022-09-04 DIAGNOSIS — M21612 Bunion of left foot: Secondary | ICD-10-CM

## 2022-09-04 DIAGNOSIS — M2012 Hallux valgus (acquired), left foot: Secondary | ICD-10-CM | POA: Diagnosis not present

## 2022-09-04 DIAGNOSIS — M79674 Pain in right toe(s): Secondary | ICD-10-CM | POA: Diagnosis not present

## 2022-09-04 DIAGNOSIS — M2011 Hallux valgus (acquired), right foot: Secondary | ICD-10-CM | POA: Diagnosis not present

## 2022-09-04 DIAGNOSIS — E1140 Type 2 diabetes mellitus with diabetic neuropathy, unspecified: Secondary | ICD-10-CM | POA: Diagnosis not present

## 2022-09-04 DIAGNOSIS — N183 Chronic kidney disease, stage 3 unspecified: Secondary | ICD-10-CM | POA: Diagnosis not present

## 2022-09-04 NOTE — Progress Notes (Signed)
This patient returns to my office for at risk foot care.  This patient requires this care by a professional since this patient will be at risk due to having acute kidney disease ,DM and CKD. He presents to the office with his wife.  This patient is unable to cut nails himself since the patient cannot reach his nails.These nails are painful walking and wearing shoes.  This patient presents for at risk foot care today.  General Appearance  Alert, conversant and in no acute stress.  Vascular  Dorsalis pedis and posterior tibial  pulses are palpable  bilaterally.  Capillary return is within normal limits  bilaterally. Temperature is within normal limits  bilaterally.  Neurologic  Senn-Weinstein monofilament wire test diminished   bilaterally. Muscle power within normal limits bilaterally.  Nails Thick disfigured discolored nails with subungual debris  from hallux to fifth toes bilaterally. No evidence of bacterial infection or drainage bilaterally.  Orthopedic  No limitations of motion  feet .  No crepitus or effusions noted.  HAV  B/L.  Hammer toes 2-5  B/L.  Skin  normotropic skin with no porokeratosis noted bilaterally.  No signs of infections or ulcers noted.     Onychomycosis  Pain in right toes  Pain in left toes  Consent was obtained for treatment procedures.   Mechanical debridement of nails 1-5  bilaterally performed with a nail nipper.  Filed with dremel without incident.    Return office visit   3 months                   Told patient to return for periodic foot care and evaluation due to potential at risk complications.   Gardiner Barefoot DPM

## 2022-09-13 DIAGNOSIS — K219 Gastro-esophageal reflux disease without esophagitis: Secondary | ICD-10-CM | POA: Diagnosis not present

## 2022-09-13 DIAGNOSIS — E782 Mixed hyperlipidemia: Secondary | ICD-10-CM | POA: Diagnosis not present

## 2022-09-13 DIAGNOSIS — E1122 Type 2 diabetes mellitus with diabetic chronic kidney disease: Secondary | ICD-10-CM | POA: Diagnosis not present

## 2022-09-13 DIAGNOSIS — I10 Essential (primary) hypertension: Secondary | ICD-10-CM | POA: Diagnosis not present

## 2022-09-13 DIAGNOSIS — N183 Chronic kidney disease, stage 3 unspecified: Secondary | ICD-10-CM | POA: Diagnosis not present

## 2022-10-09 DIAGNOSIS — H25043 Posterior subcapsular polar age-related cataract, bilateral: Secondary | ICD-10-CM | POA: Diagnosis not present

## 2022-10-09 DIAGNOSIS — H2512 Age-related nuclear cataract, left eye: Secondary | ICD-10-CM | POA: Diagnosis not present

## 2022-10-09 DIAGNOSIS — H2513 Age-related nuclear cataract, bilateral: Secondary | ICD-10-CM | POA: Diagnosis not present

## 2022-10-09 DIAGNOSIS — H25013 Cortical age-related cataract, bilateral: Secondary | ICD-10-CM | POA: Diagnosis not present

## 2022-10-09 DIAGNOSIS — H18413 Arcus senilis, bilateral: Secondary | ICD-10-CM | POA: Diagnosis not present

## 2022-10-15 DIAGNOSIS — N183 Chronic kidney disease, stage 3 unspecified: Secondary | ICD-10-CM | POA: Diagnosis not present

## 2022-10-15 DIAGNOSIS — K219 Gastro-esophageal reflux disease without esophagitis: Secondary | ICD-10-CM | POA: Diagnosis not present

## 2022-10-15 DIAGNOSIS — E1122 Type 2 diabetes mellitus with diabetic chronic kidney disease: Secondary | ICD-10-CM | POA: Diagnosis not present

## 2022-10-15 DIAGNOSIS — I10 Essential (primary) hypertension: Secondary | ICD-10-CM | POA: Diagnosis not present

## 2022-10-15 DIAGNOSIS — E782 Mixed hyperlipidemia: Secondary | ICD-10-CM | POA: Diagnosis not present

## 2022-12-04 ENCOUNTER — Ambulatory Visit: Payer: Medicare Other | Admitting: Podiatry

## 2022-12-04 ENCOUNTER — Encounter: Payer: Self-pay | Admitting: Podiatry

## 2022-12-04 DIAGNOSIS — M79675 Pain in left toe(s): Secondary | ICD-10-CM

## 2022-12-04 DIAGNOSIS — E1140 Type 2 diabetes mellitus with diabetic neuropathy, unspecified: Secondary | ICD-10-CM | POA: Diagnosis not present

## 2022-12-04 DIAGNOSIS — M79674 Pain in right toe(s): Secondary | ICD-10-CM

## 2022-12-04 DIAGNOSIS — B351 Tinea unguium: Secondary | ICD-10-CM | POA: Diagnosis not present

## 2022-12-04 NOTE — Progress Notes (Signed)
This patient returns to my office for at risk foot care.  This patient requires this care by a professional since this patient will be at risk due to having acute kidney disease ,DM and CKD. He presents to the office with his wife.  This patient is unable to cut nails himself since the patient cannot reach his nails.These nails are painful walking and wearing shoes.  This patient presents for at risk foot care today.  General Appearance  Alert, conversant and in no acute stress.  Vascular  Dorsalis pedis and posterior tibial  pulses are palpable  bilaterally.  Capillary return is within normal limits  bilaterally. Temperature is within normal limits  bilaterally.  Neurologic  Senn-Weinstein monofilament wire test diminished   bilaterally. Muscle power within normal limits bilaterally.  Nails Thick disfigured discolored nails with subungual debris  from hallux to fifth toes bilaterally. No evidence of bacterial infection or drainage bilaterally.  Orthopedic  No limitations of motion  feet .  No crepitus or effusions noted.  HAV  B/L.  Hammer toes 2-5  B/L.  Skin  normotropic skin with no porokeratosis noted bilaterally.  No signs of infections or ulcers noted.     Onychomycosis  Pain in right toes  Pain in left toes  Consent was obtained for treatment procedures.   Mechanical debridement of nails 1-5  bilaterally performed with a nail nipper.  Filed with dremel without incident.    Return office visit   3 months                   Told patient to return for periodic foot care and evaluation due to potential at risk complications.   Nycole Kawahara DPM  

## 2023-01-28 DIAGNOSIS — H2512 Age-related nuclear cataract, left eye: Secondary | ICD-10-CM | POA: Diagnosis not present

## 2023-01-28 DIAGNOSIS — M47817 Spondylosis without myelopathy or radiculopathy, lumbosacral region: Secondary | ICD-10-CM | POA: Diagnosis not present

## 2023-01-29 DIAGNOSIS — H2511 Age-related nuclear cataract, right eye: Secondary | ICD-10-CM | POA: Diagnosis not present

## 2023-01-29 DIAGNOSIS — H25011 Cortical age-related cataract, right eye: Secondary | ICD-10-CM | POA: Diagnosis not present

## 2023-01-29 DIAGNOSIS — H25041 Posterior subcapsular polar age-related cataract, right eye: Secondary | ICD-10-CM | POA: Diagnosis not present

## 2023-02-11 DIAGNOSIS — H2511 Age-related nuclear cataract, right eye: Secondary | ICD-10-CM | POA: Diagnosis not present

## 2023-02-25 DIAGNOSIS — I10 Essential (primary) hypertension: Secondary | ICD-10-CM | POA: Diagnosis not present

## 2023-02-25 DIAGNOSIS — N1831 Chronic kidney disease, stage 3a: Secondary | ICD-10-CM | POA: Diagnosis not present

## 2023-02-25 DIAGNOSIS — I69322 Dysarthria following cerebral infarction: Secondary | ICD-10-CM | POA: Diagnosis not present

## 2023-02-25 DIAGNOSIS — Z0000 Encounter for general adult medical examination without abnormal findings: Secondary | ICD-10-CM | POA: Diagnosis not present

## 2023-02-25 DIAGNOSIS — E782 Mixed hyperlipidemia: Secondary | ICD-10-CM | POA: Diagnosis not present

## 2023-02-25 DIAGNOSIS — D696 Thrombocytopenia, unspecified: Secondary | ICD-10-CM | POA: Diagnosis not present

## 2023-02-25 DIAGNOSIS — I700 Atherosclerosis of aorta: Secondary | ICD-10-CM | POA: Diagnosis not present

## 2023-02-25 DIAGNOSIS — E1122 Type 2 diabetes mellitus with diabetic chronic kidney disease: Secondary | ICD-10-CM | POA: Diagnosis not present

## 2023-02-25 DIAGNOSIS — E1136 Type 2 diabetes mellitus with diabetic cataract: Secondary | ICD-10-CM | POA: Diagnosis not present

## 2023-02-25 DIAGNOSIS — I69359 Hemiplegia and hemiparesis following cerebral infarction affecting unspecified side: Secondary | ICD-10-CM | POA: Diagnosis not present

## 2023-03-05 ENCOUNTER — Ambulatory Visit (INDEPENDENT_AMBULATORY_CARE_PROVIDER_SITE_OTHER): Payer: Medicare Other | Admitting: Podiatry

## 2023-03-05 ENCOUNTER — Encounter: Payer: Self-pay | Admitting: Podiatry

## 2023-03-05 DIAGNOSIS — M79674 Pain in right toe(s): Secondary | ICD-10-CM | POA: Diagnosis not present

## 2023-03-05 DIAGNOSIS — B351 Tinea unguium: Secondary | ICD-10-CM

## 2023-03-05 DIAGNOSIS — M79675 Pain in left toe(s): Secondary | ICD-10-CM

## 2023-03-05 DIAGNOSIS — E1140 Type 2 diabetes mellitus with diabetic neuropathy, unspecified: Secondary | ICD-10-CM

## 2023-03-05 NOTE — Progress Notes (Signed)
This patient returns to my office for at risk foot care.  This patient requires this care by a professional since this patient will be at risk due to having acute kidney disease ,DM and CKD. He presents to the office with his wife.  This patient is unable to cut nails himself since the patient cannot reach his nails.These nails are painful walking and wearing shoes.  This patient presents for at risk foot care today.  General Appearance  Alert, conversant and in no acute stress.  Vascular  Dorsalis pedis and posterior tibial  pulses are palpable  bilaterally.  Capillary return is within normal limits  bilaterally. Temperature is within normal limits  bilaterally.  Neurologic  Senn-Weinstein monofilament wire test diminished   bilaterally. Muscle power within normal limits bilaterally.  Nails Thick disfigured discolored nails with subungual debris  from hallux to fifth toes bilaterally. No evidence of bacterial infection or drainage bilaterally.  Orthopedic  No limitations of motion  feet .  No crepitus or effusions noted.  HAV  B/L.  Hammer toes 2-5  B/L.  Skin  normotropic skin with no porokeratosis noted bilaterally.  No signs of infections or ulcers noted.     Onychomycosis  Pain in right toes  Pain in left toes  Consent was obtained for treatment procedures.   Mechanical debridement of nails 1-5  bilaterally performed with a nail nipper.  Filed with dremel without incident.    Return office visit   3 months                   Told patient to return for periodic foot care and evaluation due to potential at risk complications.   Gregory Mayer DPM  

## 2023-04-26 DIAGNOSIS — Z86010 Personal history of colonic polyps: Secondary | ICD-10-CM | POA: Diagnosis not present

## 2023-04-26 DIAGNOSIS — Z09 Encounter for follow-up examination after completed treatment for conditions other than malignant neoplasm: Secondary | ICD-10-CM | POA: Diagnosis not present

## 2023-06-07 ENCOUNTER — Other Ambulatory Visit: Payer: Self-pay | Admitting: Adult Health

## 2023-06-07 DIAGNOSIS — F0150 Vascular dementia without behavioral disturbance: Secondary | ICD-10-CM

## 2023-06-10 ENCOUNTER — Ambulatory Visit: Payer: Medicare Other | Admitting: Podiatry

## 2023-06-10 NOTE — Telephone Encounter (Signed)
Rx refilled per last office visit note.

## 2023-06-13 ENCOUNTER — Ambulatory Visit: Payer: Medicare Other | Admitting: Podiatry

## 2023-06-13 DIAGNOSIS — B351 Tinea unguium: Secondary | ICD-10-CM

## 2023-06-13 DIAGNOSIS — M79674 Pain in right toe(s): Secondary | ICD-10-CM

## 2023-06-13 DIAGNOSIS — M79675 Pain in left toe(s): Secondary | ICD-10-CM

## 2023-06-13 DIAGNOSIS — E1140 Type 2 diabetes mellitus with diabetic neuropathy, unspecified: Secondary | ICD-10-CM

## 2023-06-13 NOTE — Progress Notes (Addendum)
This patient returns to my office for at risk foot care.  This patient requires this care by a professional since this patient will be at risk due to having acute kidney disease ,DM and CKD. He presents to the office with his wife.  This patient is unable to cut nails himself since the patient cannot reach his nails.These nails are painful walking and wearing shoes.  This patient presents for at risk foot care today.  General Appearance  Alert, conversant and in no acute stress.  Vascular  Dorsalis pedis and posterior tibial  pulses are palpable  bilaterally.  Capillary return is within normal limits  bilaterally. Temperature is within normal limits  bilaterally.  Neurologic  Senn-Weinstein monofilament wire test diminished   bilaterally. Muscle power within normal limits bilaterally.  Nails Thick disfigured discolored nails with subungual debris  from hallux to fifth toes bilaterally. No evidence of bacterial infection or drainage bilaterally. Scant blood noted subungually right hallux toenail.  Orthopedic  No limitations of motion  feet .  No crepitus or effusions noted.  HAV  B/L.  Hammer toes 2-5  B/L.  Skin  normotropic skin with no porokeratosis noted bilaterally.  No signs of infections or ulcers noted.     Onychomycosis  Pain in right toes  Pain in left toes  Consent was obtained for treatment procedures.   Mechanical debridement of nails 1-5  bilaterally performed with a nail nipper.  Filed with dremel without incident. DSD applied right hallux.  Call the office if this condition worsens.   Return office visit   3 months                   Told patient to return for periodic foot care and evaluation due to potential at risk complications.   Helane Gunther DPM

## 2023-07-24 ENCOUNTER — Ambulatory Visit: Payer: Medicare Other | Admitting: Adult Health

## 2023-08-28 DIAGNOSIS — E1136 Type 2 diabetes mellitus with diabetic cataract: Secondary | ICD-10-CM | POA: Diagnosis not present

## 2023-08-28 DIAGNOSIS — I10 Essential (primary) hypertension: Secondary | ICD-10-CM | POA: Diagnosis not present

## 2023-08-28 DIAGNOSIS — D696 Thrombocytopenia, unspecified: Secondary | ICD-10-CM | POA: Diagnosis not present

## 2023-08-28 DIAGNOSIS — E1122 Type 2 diabetes mellitus with diabetic chronic kidney disease: Secondary | ICD-10-CM | POA: Diagnosis not present

## 2023-08-28 DIAGNOSIS — N1831 Chronic kidney disease, stage 3a: Secondary | ICD-10-CM | POA: Diagnosis not present

## 2023-08-28 DIAGNOSIS — M25569 Pain in unspecified knee: Secondary | ICD-10-CM | POA: Diagnosis not present

## 2023-08-28 DIAGNOSIS — I69359 Hemiplegia and hemiparesis following cerebral infarction affecting unspecified side: Secondary | ICD-10-CM | POA: Diagnosis not present

## 2023-08-28 DIAGNOSIS — Z23 Encounter for immunization: Secondary | ICD-10-CM | POA: Diagnosis not present

## 2023-08-28 DIAGNOSIS — E1140 Type 2 diabetes mellitus with diabetic neuropathy, unspecified: Secondary | ICD-10-CM | POA: Diagnosis not present

## 2023-09-12 ENCOUNTER — Ambulatory Visit: Payer: Medicare Other | Admitting: Podiatry

## 2023-09-16 ENCOUNTER — Encounter: Payer: Self-pay | Admitting: Podiatry

## 2023-09-16 ENCOUNTER — Ambulatory Visit (INDEPENDENT_AMBULATORY_CARE_PROVIDER_SITE_OTHER): Payer: Medicare Other | Admitting: Podiatry

## 2023-09-16 DIAGNOSIS — M79674 Pain in right toe(s): Secondary | ICD-10-CM | POA: Diagnosis not present

## 2023-09-16 DIAGNOSIS — B351 Tinea unguium: Secondary | ICD-10-CM | POA: Diagnosis not present

## 2023-09-16 DIAGNOSIS — E1140 Type 2 diabetes mellitus with diabetic neuropathy, unspecified: Secondary | ICD-10-CM

## 2023-09-16 DIAGNOSIS — M79675 Pain in left toe(s): Secondary | ICD-10-CM

## 2023-09-16 NOTE — Progress Notes (Signed)
This patient returns to my office for at risk foot care.  This patient requires this care by a professional since this patient will be at risk due to having acute kidney disease ,DM and CKD. He presents to the office with his wife.  This patient is unable to cut nails himself since the patient cannot reach his nails.These nails are painful walking and wearing shoes.  This patient presents for at risk foot care today.  General Appearance  Alert, conversant and in no acute stress.  Vascular  Dorsalis pedis and posterior tibial  pulses are palpable  bilaterally.  Capillary return is within normal limits  bilaterally. Temperature is within normal limits  bilaterally.  Neurologic  Senn-Weinstein monofilament wire test diminished   bilaterally. Muscle power within normal limits bilaterally.  Nails Thick disfigured discolored nails with subungual debris  from hallux to fifth toes bilaterally. No evidence of bacterial infection or drainage bilaterally.   Orthopedic  No limitations of motion  feet .  No crepitus or effusions noted.  HAV  B/L.  Hammer toes 2-5  B/L.  Skin  normotropic skin with no porokeratosis noted bilaterally.  No signs of infections or ulcers noted.     Onychomycosis  Pain in right toes  Pain in left toes  Consent was obtained for treatment procedures.   Mechanical debridement of nails 1-5  bilaterally performed with a nail nipper.  Filed with dremel without incident.  Call the office if this condition worsens.   Return office visit   3 months                   Told patient to return for periodic foot care and evaluation due to potential at risk complications.   Helane Gunther DPM

## 2023-09-19 DIAGNOSIS — R053 Chronic cough: Secondary | ICD-10-CM | POA: Diagnosis not present

## 2023-09-19 DIAGNOSIS — R059 Cough, unspecified: Secondary | ICD-10-CM | POA: Diagnosis not present

## 2023-11-18 DIAGNOSIS — M25561 Pain in right knee: Secondary | ICD-10-CM | POA: Diagnosis not present

## 2023-11-20 ENCOUNTER — Ambulatory Visit

## 2023-11-25 ENCOUNTER — Encounter: Payer: Self-pay | Admitting: Podiatry

## 2023-11-25 ENCOUNTER — Ambulatory Visit: Payer: Medicare Other | Admitting: Podiatry

## 2023-11-25 DIAGNOSIS — M79675 Pain in left toe(s): Secondary | ICD-10-CM

## 2023-11-25 DIAGNOSIS — E1140 Type 2 diabetes mellitus with diabetic neuropathy, unspecified: Secondary | ICD-10-CM

## 2023-11-25 DIAGNOSIS — M79674 Pain in right toe(s): Secondary | ICD-10-CM | POA: Diagnosis not present

## 2023-11-25 DIAGNOSIS — B351 Tinea unguium: Secondary | ICD-10-CM | POA: Diagnosis not present

## 2023-11-25 NOTE — Progress Notes (Signed)
 This patient returns to my office for at risk foot care.  This patient requires this care by a professional since this patient will be at risk due to having acute kidney disease ,DM and CKD. He presents to the office with his wife.  This patient is unable to cut nails himself since the patient cannot reach his nails.These nails are painful walking and wearing shoes.  This patient presents for at risk foot care today.  General Appearance  Alert, conversant and in no acute stress.  Vascular  Dorsalis pedis and posterior tibial  pulses are palpable  bilaterally.  Capillary return is within normal limits  bilaterally. Temperature is within normal limits  bilaterally.  Neurologic  Senn-Weinstein monofilament wire test diminished   bilaterally. Muscle power within normal limits bilaterally.  Nails Thick disfigured discolored nails with subungual debris  from hallux to fifth toes bilaterally. No evidence of bacterial infection or drainage bilaterally.   Orthopedic  No limitations of motion  feet .  No crepitus or effusions noted.  HAV  B/L.  Hammer toes 2-5  B/L.  Skin  normotropic skin with no porokeratosis noted bilaterally.  No signs of infections or ulcers noted.     Onychomycosis  Pain in right toes  Pain in left toes  Consent was obtained for treatment procedures.   Mechanical debridement of nails 1-5  bilaterally performed with a nail nipper.  Filed with dremel without incident.     Return office visit   10 weeks                   Told patient to return for periodic foot care and evaluation due to potential at risk complications.   Helane Gunther DPM

## 2024-01-27 ENCOUNTER — Telehealth: Payer: Self-pay | Admitting: Adult Health

## 2024-01-27 NOTE — Telephone Encounter (Signed)
 Appointment details confirmed

## 2024-01-30 NOTE — Progress Notes (Deleted)
 [facility] [address] [place]. [place] [address] [phone]       OFFICE FOLLOW-UP NOTE  [name] Date of Birth:  12-01-1945 Medical Record Number:  [id number]    No chief complaint on file.     HPI:   Update 02/03/2024 JM: Patient returns for follow-up visit accompanied by his wife after prior visit 1.5 years ago.  Reports overall stable without new stroke/TIA symptoms.  Reports cognition ***. MMSE today ***/30, remains on Namenda  and Prevagen.  Reports compliance on aspirin  and Crestor .  Routinely follows with PCP for stroke risk factor management.      History provided for reference purposes only Update 07/19/2022 JM: Patient returns for yearly stroke follow-up accompanied by his wife.  Overall stable without new stroke/TIA symptoms. Reports cognition has improved since prior visit MMSE today 29/30 (prior 23/20) Has remained on Namenda  10 mg twice daily, also reports taking Prevagen extra strength  Compliant on aspirin  and Crestor  Blood pressure well controlled Routinely follows with PCP Dr. Husain   Update 07/19/2021 JM: returns for 6 month stroke follow up accompanied by his wife.  Overall stable -denies new stroke/TIA symptoms Reports cognition has been stable with some improvement noted - MMSE today 23/30 (prior 16/30) Started on Namenda  10mg  twice daily after prior visit - denies side effects  Does report double vision since his stroke which has not worsened - was seen by his ophthalmology without any concerning findings - initially after wearing stroke was wearing eye patch which would help on occasion   Compliant on aspirin  and Crestor  -denies side effects Blood pressure today 133/80 - monitors at home which has been stable  No new concerns at this time  Update 01/11/2021 JM: [name] [name] returns for follow-up after being seen 3 months ago with concerns of cognitive decline and behavioral changes as well as prior stroke history.   Work-up for underlying causes unremarkable including MRI and lab work.  EEG not yet completed. Per wife, improvement of mood and currently at baseline.  Cognition has been stable without worsening although MMSE today 16/30 (prior 19/30).  He questions possibility of returning to driving.  He admits to remaining sedentary with limited to no physical activity and no mental stimulation.  No further behavioral concerns.  History of stroke without new stroke/TIA symptoms.  Compliant on aspirin  and Crestor  without associated side effects.  Recent lipid panel and A1c satisfactory.  Blood pressure today 142/93 on multiple antihypertensives.  Update 11/14/2020 JM: [name] [name] is being seen for acute visit by request of wife due to worsening cognition and behavioral changes.  Majority of history and information obtained by wife.  Cognition has been slowly declining since prior visit approximately 4 months ago.  Denies any recent or acute changes He has been having difficulty with short-term memory, processing of information and confusion which can worsen in the evening time.  Becomes agitated quickly and will curse which is not like him per wife.  Denies aggressiveness, hallucinations, paranoia or delusions Sleeps well at night although frequent nocturia.  Denies snoring or daytime fatigue Admits to sedentary lifestyle with no routine physical activity but will accompany wife to visits with family, grocery store, going out for breakfast and doctor's appointments Previously working with SLP but since completed and does not do exercises as recommended or any type of memory exercises Able to maintain ADLs with only some assistance by wife due to balanice difficulties. He does not do any type of cleaning, cooking or  bill paying.  He does not drive.  Wife is his primary caregiver and provides 24/7 supervision  Compliant on aspirin  and Crestor  -denies associated side effects.  No recent lipid panel Blood pressure today  163/83 on carvedilol , amlodipine , and hydralazineper PCP.  Similar reading on recheck  No further concerns at this time  Update 07/11/2020 JM: [name] [name] is being seen for 60-month stroke follow-up accompanied by his wife.  Reports residual occasional imbalance and cognitive impairment with improvement since prior visit.  He has since completed therapies but continues to do exercises at home as recommended.  He questions potential return to driving.  Denies new stroke/TIA symptoms.  Remains on aspirin  and Crestor  for secondary stroke prevention of side effects.  Blood pressure today 176/98 and similar on recheck.   Occasionally monitors at home typically SBP 150s per wife.  No further concerns at this time.  Initial visit 01/07/2020 Dr. Janett Medin: [name] [name] is a 78 year old pleasant African-American male seen today for initial office follow-up visit following hospital consultation for stroke in February 2021.  Is accompanied by his wife.  History is obtained from them, review of electronic medical records and I personally reviewed imaging films in PACS.  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  He developed altered mental status and weakness in 10/17/2019 and CT scan showed hypodensities in right cerebellum and right occipital lobe suspicious for infarcts.  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  MRA of the head showed chronic moderate left P1 stenosis.  Carotid Dopplers were unremarkable.  2D echo showed normal ejection fraction.  LDL cholesterol was 72 mg percent hemoglobin A1c was 6.7.  Patient was initially started on IV heparin  and was planned for being discharged on Eliquis  and went to inpatient rehab.  However his  medication list today states that he is on aspirin  81 mg only and patient is unable to tell me the reason for this.  He is currently getting home physical and occupational therapy which she has just finished but outpatient therapy has not yet started.  He walks with a wheeled walker but feels his balance is off and tends to fall backwards and has poor truncal balance.  He can walk with a walker short distances well.  He has had no falls or injuries.  He just had follow-up visit with Dr. Sharl Davies today.  He does complain of mild memory difficulties and cognitive impairment since his stroke.  This is not progressive.   ROS:   14 system review of systems is positive for those listed in HPI and all other systems negative PMH:  Past Medical History:  Diagnosis Date   Chronic back pain    stenosis   Enlarged prostate    GERD (gastroesophageal reflux disease)    occasionally will take a zantac(maybe once a month)   History of colon polyps    History of kidney stones    History of stress test    done 10 yrs. ago, as a baseline    Hyperlipidemia    takes Crestor  daily   Hypertension    takes Amlodipine  daily and Lotensin  as well   Stroke [facility])     Social History:  Social History   Socioeconomic History   Marital status: Married  Spouse name: Not on file   Number of children: Not on file   Years of education: Not on file   Highest education level: Not on file  Occupational History   Not on file  Tobacco Use   Smoking status: Former    Current packs/day: 0.25    Average packs/day: 0.3 packs/day for 40.0 years (10.0 ttl pk-yrs)    Types: Cigarettes   Smokeless tobacco: Never   Tobacco comments:    none since 06/24/13  Vaping Use   Vaping status: Never Used  Substance and Sexual Activity   Alcohol use: No   Drug use: No   Sexual activity: Not on file  Other Topics Concern   Not on file  Social History Narrative   Not on file   Social Drivers of Health   Financial Resource  Strain: Not on file  Food Insecurity: Not on file  Transportation Needs: Not on file  Physical Activity: Not on file  Stress: Not on file  Social Connections: Not on file  Intimate Partner Violence: Not on file    Medications:   Current Outpatient Medications on File Prior to Visit  Medication Sig Dispense Refill   acetaminophen  (TYLENOL ) 500 MG tablet Take 1,000 mg by mouth every 6 (six) hours as needed for headache (pain).     acyclovir (ZOVIRAX) 400 MG tablet 1 tablet     amLODipine  (NORVASC ) 2.5 MG tablet Take 2.5 mg by mouth daily.      Apoaequorin (PREVAGEN EXTRA STRENGTH PO) Take 20 mg by mouth daily.     Ascorbic Acid (VITAMIN C) 500 MG CAPS Take 500 mg by mouth daily.      aspirin  EC 81 MG tablet Take 81 mg by mouth daily.     Calcium  Carbonate-Vitamin D (CALCIUM -D PO) Take 1 tablet by mouth daily.     carvedilol  (COREG ) 25 MG tablet Take 1 tablet (25 mg total) by mouth 2 (two) times daily with a meal. 60 tablet 0   Cyanocobalamin  (VITAMIN B-12 PO) Take 1 tablet by mouth daily.     hydrALAZINE  (APRESOLINE ) 10 MG tablet Take 1 tablet (10 mg total) by mouth every 6 (six) hours. 180 tablet 0   magnesium  citrate SOLN Take 1 Bottle by mouth daily as needed for severe constipation.      memantine  (NAMENDA ) 10 MG tablet TAKE 1 TABLET BY MOUTH TWICE DAILY 60 tablet 10   metFORMIN (GLUCOPHAGE) 500 MG tablet Take 500 mg by mouth 2 (two) times daily.     Multiple Vitamin (MULTIVITAMIN WITH MINERALS) TABS tablet Take 1 tablet by mouth daily.     Multiple Vitamins-Minerals (MULTI VITAMIN/MINERALS) TABS See admin instructions.     pantoprazole  (PROTONIX ) 40 MG tablet Take 1 tablet (40 mg total) by mouth daily. 30 tablet 0   rosuvastatin  (CRESTOR ) 10 MG tablet Take 1 tablet (10 mg total) by mouth daily. 30 tablet 0   sildenafil (VIAGRA) 100 MG tablet 1 tablet as needed     tamsulosin  (FLOMAX ) 0.4 MG CAPS capsule Take 1 capsule (0.4 mg total) by mouth daily. 30 capsule 0   Vitamin D,  Cholecalciferol, 10 MCG (400 UNIT) CHEW 1 tablet     No current facility-administered medications on file prior to visit.    Allergies:   Allergies  Allergen Reactions   Lyrica [Pregabalin] Nausea Only and Other (See Comments)    Hallucinations and dizziness, also   Sulfa Antibiotics Other (See Comments)    Unknown reaction    Physical  Exam There were no vitals filed for this visit.    There is no height or weight on file to calculate BMI.   General: well developed, well nourished very pleasant elderly African-American male, seated, in no evident distress Head: head normocephalic and atraumatic.  Neck: supple with no carotid or supraclavicular bruits Cardiovascular: regular rate and rhythm, no murmurs Musculoskeletal: no deformity Skin:  no rash/petichiae Vascular:  Normal pulses all extremities  Neurologic Exam Mental Status: Awake and fully alert. Slowed hesitant speech but no evidence of aphasia or dysarthria.  Oriented to place and time. Recent memory impaired and remote memory intact. attention span, concentration and fund of knowledge mildly diminished,. Mood and affect appropriate.    07/19/2022    3:09 PM 07/19/2021   12:44 PM 01/11/2021   12:41 PM  MMSE - Mini Mental State Exam  Orientation to time 5 4 1   Orientation to Place 5 5 4   Registration 3 3 3   Attention/ Calculation 5 2 0  Recall 2 1 0  Language- name 2 objects 2 2 2   Language- repeat 1 1 1   Language- follow 3 step command 3 3 3   Language- read & follow direction 1 1 1   Write a sentence 1 1 1   Copy design 1 0 0  Total score 29 23 16    Cranial Nerves: Pupils equal, briskly reactive to light. Extraocular movements full without nystagmus but evidence of OS misalignment after convergence testing. Visual fields full to confrontation. Hearing intact. Facial sensation intact. Face, tongue, palate moves normally and symmetrically.  Motor: Normal bulk and tone. Normal strength in all tested extremity  muscles. Sensory.: intact to touch ,pinprick .position and vibratory sensation.  Coordination: Rapid alternating movements normal in all extremities. Finger-to-nose and heel-to-shin performed accurately bilaterally. Gait and Station: Arises from chair without difficulty. Stance is broad-based gait demonstrates mild ataxia and mild imbalance with use of RW.  Tandem walk and heel toe not attempted reflexes: 1+ and symmetric. Toes downgoing.       ASSESSMENT/PLAN: 78 year old African-American male with multiple by cerebral infarcts in February 2021 s/p aortic graft surgery for aortic dissection with residual mild cognitive, diplopia and gait impairment.    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  Exercises and activities provided at visit  Multiple small brain infarcts:  Continue aspirin  81 mg daily  and Crestor  for secondary stroke prevention.   Discussed secondary stroke prevention measures and importance of close PCP f/u for aggressive stroke risk factor management including BP goal<130/90, HLD with LDL goal<70 and DM with A1c.<7     Follow-up in 1 year or call earlier if needed     CC:  Husain, Karrar, MD    I personally spent a total of *** minutes in the care of the patient today including {Time Based Coding:210964241}.   Johny Nap, AGNP-BC  [facility] [address] [place], [place] [address]  Phone [phone] Fax [phone] Note: This document was prepared with digital dictation and possible smart phrase technology. Any transcriptional errors that result from this process are unintentional.

## 2024-01-31 ENCOUNTER — Telehealth: Payer: Self-pay | Admitting: Adult Health

## 2024-01-31 NOTE — Telephone Encounter (Signed)
 Pt wife called to cancel appt , Wife will be in hospital   Appt Canceled

## 2024-02-03 ENCOUNTER — Ambulatory Visit: Payer: Medicare Other | Admitting: Adult Health

## 2024-02-10 ENCOUNTER — Encounter: Payer: Self-pay | Admitting: Podiatry

## 2024-02-10 ENCOUNTER — Ambulatory Visit: Admitting: Podiatry

## 2024-02-10 DIAGNOSIS — M79675 Pain in left toe(s): Secondary | ICD-10-CM

## 2024-02-10 DIAGNOSIS — B351 Tinea unguium: Secondary | ICD-10-CM

## 2024-02-10 DIAGNOSIS — M79674 Pain in right toe(s): Secondary | ICD-10-CM

## 2024-02-10 DIAGNOSIS — E1140 Type 2 diabetes mellitus with diabetic neuropathy, unspecified: Secondary | ICD-10-CM

## 2024-02-10 NOTE — Progress Notes (Signed)
 This patient returns to my office for at risk foot care.  This patient requires this care by a professional since this patient will be at risk due to having acute kidney disease ,DM and CKD. He presents to the office with his wife.  This patient is unable to cut nails himself since the patient cannot reach his nails.These nails are painful walking and wearing shoes.  This patient presents for at risk foot care today.  General Appearance  Alert, conversant and in no acute stress.  Vascular  Dorsalis pedis and posterior tibial  pulses are palpable  bilaterally.  Capillary return is within normal limits  bilaterally. Temperature is within normal limits  bilaterally.  Neurologic  Senn-Weinstein monofilament wire test diminished   bilaterally. Muscle power within normal limits bilaterally.  Nails Thick disfigured discolored nails with subungual debris  from hallux to fifth toes bilaterally. No evidence of bacterial infection or drainage bilaterally.   Orthopedic  No limitations of motion  feet .  No crepitus or effusions noted.  HAV  B/L.  Hammer toes 2-5  B/L.  Skin  normotropic skin with no porokeratosis noted bilaterally.  No signs of infections or ulcers noted.     Onychomycosis  Pain in right toes  Pain in left toes  Consent was obtained for treatment procedures.   Mechanical debridement of nails 1-5  bilaterally performed with a nail nipper.  Filed with dremel without incident.     Return office visit   12 weeks                   Told patient to return for periodic foot care and evaluation due to potential at risk complications.   Cordella Bold DPM

## 2024-02-17 DIAGNOSIS — E782 Mixed hyperlipidemia: Secondary | ICD-10-CM | POA: Diagnosis not present

## 2024-02-17 DIAGNOSIS — E1140 Type 2 diabetes mellitus with diabetic neuropathy, unspecified: Secondary | ICD-10-CM | POA: Diagnosis not present

## 2024-02-17 DIAGNOSIS — E1136 Type 2 diabetes mellitus with diabetic cataract: Secondary | ICD-10-CM | POA: Diagnosis not present

## 2024-02-17 DIAGNOSIS — N1831 Chronic kidney disease, stage 3a: Secondary | ICD-10-CM | POA: Diagnosis not present

## 2024-02-29 ENCOUNTER — Inpatient Hospital Stay (HOSPITAL_COMMUNITY)
Admission: EM | Admit: 2024-02-29 | Discharge: 2024-03-05 | DRG: 077 | Disposition: A | Discharge status: Skilled Nursing Facility | Attending: Internal Medicine | Admitting: Internal Medicine

## 2024-02-29 ENCOUNTER — Other Ambulatory Visit: Payer: Self-pay

## 2024-02-29 ENCOUNTER — Emergency Department (HOSPITAL_COMMUNITY)

## 2024-02-29 DIAGNOSIS — I7100 Dissection of unspecified site of aorta: Secondary | ICD-10-CM | POA: Diagnosis present

## 2024-02-29 DIAGNOSIS — E7800 Pure hypercholesterolemia, unspecified: Secondary | ICD-10-CM | POA: Diagnosis present

## 2024-02-29 DIAGNOSIS — R7989 Other specified abnormal findings of blood chemistry: Secondary | ICD-10-CM | POA: Diagnosis not present

## 2024-02-29 DIAGNOSIS — Z860100 Personal history of colon polyps, unspecified: Secondary | ICD-10-CM

## 2024-02-29 DIAGNOSIS — Z7984 Long term (current) use of oral hypoglycemic drugs: Secondary | ICD-10-CM

## 2024-02-29 DIAGNOSIS — R4182 Altered mental status, unspecified: Secondary | ICD-10-CM | POA: Diagnosis not present

## 2024-02-29 DIAGNOSIS — G9340 Encephalopathy, unspecified: Secondary | ICD-10-CM | POA: Diagnosis present

## 2024-02-29 DIAGNOSIS — I674 Hypertensive encephalopathy: Secondary | ICD-10-CM | POA: Diagnosis not present

## 2024-02-29 DIAGNOSIS — K219 Gastro-esophageal reflux disease without esophagitis: Secondary | ICD-10-CM | POA: Diagnosis not present

## 2024-02-29 DIAGNOSIS — N1831 Chronic kidney disease, stage 3a: Secondary | ICD-10-CM | POA: Diagnosis present

## 2024-02-29 DIAGNOSIS — E8809 Other disorders of plasma-protein metabolism, not elsewhere classified: Secondary | ICD-10-CM | POA: Diagnosis present

## 2024-02-29 DIAGNOSIS — D696 Thrombocytopenia, unspecified: Secondary | ICD-10-CM | POA: Diagnosis present

## 2024-02-29 DIAGNOSIS — F17210 Nicotine dependence, cigarettes, uncomplicated: Secondary | ICD-10-CM | POA: Diagnosis present

## 2024-02-29 DIAGNOSIS — I1310 Hypertensive heart and chronic kidney disease without heart failure, with stage 1 through stage 4 chronic kidney disease, or unspecified chronic kidney disease: Secondary | ICD-10-CM | POA: Diagnosis present

## 2024-02-29 DIAGNOSIS — E876 Hypokalemia: Secondary | ICD-10-CM | POA: Diagnosis not present

## 2024-02-29 DIAGNOSIS — I7143 Infrarenal abdominal aortic aneurysm, without rupture: Secondary | ICD-10-CM | POA: Diagnosis not present

## 2024-02-29 DIAGNOSIS — I7102 Dissection of abdominal aorta: Secondary | ICD-10-CM | POA: Diagnosis not present

## 2024-02-29 DIAGNOSIS — I714 Abdominal aortic aneurysm, without rupture, unspecified: Secondary | ICD-10-CM | POA: Diagnosis present

## 2024-02-29 DIAGNOSIS — Z882 Allergy status to sulfonamides status: Secondary | ICD-10-CM

## 2024-02-29 DIAGNOSIS — N179 Acute kidney failure, unspecified: Secondary | ICD-10-CM | POA: Diagnosis present

## 2024-02-29 DIAGNOSIS — Z79899 Other long term (current) drug therapy: Secondary | ICD-10-CM | POA: Diagnosis not present

## 2024-02-29 DIAGNOSIS — E119 Type 2 diabetes mellitus without complications: Secondary | ICD-10-CM | POA: Diagnosis not present

## 2024-02-29 DIAGNOSIS — Z95828 Presence of other vascular implants and grafts: Secondary | ICD-10-CM | POA: Diagnosis not present

## 2024-02-29 DIAGNOSIS — J9811 Atelectasis: Secondary | ICD-10-CM | POA: Diagnosis not present

## 2024-02-29 DIAGNOSIS — R001 Bradycardia, unspecified: Secondary | ICD-10-CM | POA: Diagnosis present

## 2024-02-29 DIAGNOSIS — R531 Weakness: Secondary | ICD-10-CM | POA: Diagnosis not present

## 2024-02-29 DIAGNOSIS — Z87442 Personal history of urinary calculi: Secondary | ICD-10-CM

## 2024-02-29 DIAGNOSIS — Z1152 Encounter for screening for COVID-19: Secondary | ICD-10-CM

## 2024-02-29 DIAGNOSIS — R0789 Other chest pain: Secondary | ICD-10-CM | POA: Diagnosis present

## 2024-02-29 DIAGNOSIS — N400 Enlarged prostate without lower urinary tract symptoms: Secondary | ICD-10-CM | POA: Diagnosis present

## 2024-02-29 DIAGNOSIS — Z8673 Personal history of transient ischemic attack (TIA), and cerebral infarction without residual deficits: Secondary | ICD-10-CM

## 2024-02-29 DIAGNOSIS — Z981 Arthrodesis status: Secondary | ICD-10-CM

## 2024-02-29 DIAGNOSIS — I10 Essential (primary) hypertension: Secondary | ICD-10-CM | POA: Diagnosis present

## 2024-02-29 DIAGNOSIS — G9349 Other encephalopathy: Secondary | ICD-10-CM | POA: Diagnosis not present

## 2024-02-29 DIAGNOSIS — R569 Unspecified convulsions: Secondary | ICD-10-CM | POA: Diagnosis not present

## 2024-02-29 DIAGNOSIS — R29898 Other symptoms and signs involving the musculoskeletal system: Secondary | ICD-10-CM

## 2024-02-29 DIAGNOSIS — E663 Overweight: Secondary | ICD-10-CM | POA: Diagnosis present

## 2024-02-29 DIAGNOSIS — Z7982 Long term (current) use of aspirin: Secondary | ICD-10-CM

## 2024-02-29 DIAGNOSIS — G8929 Other chronic pain: Secondary | ICD-10-CM | POA: Diagnosis not present

## 2024-02-29 DIAGNOSIS — Z743 Need for continuous supervision: Secondary | ICD-10-CM | POA: Diagnosis not present

## 2024-02-29 DIAGNOSIS — Z888 Allergy status to other drugs, medicaments and biological substances status: Secondary | ICD-10-CM

## 2024-02-29 DIAGNOSIS — R5381 Other malaise: Secondary | ICD-10-CM | POA: Diagnosis present

## 2024-02-29 DIAGNOSIS — E1159 Type 2 diabetes mellitus with other circulatory complications: Secondary | ICD-10-CM | POA: Diagnosis not present

## 2024-02-29 DIAGNOSIS — F0390 Unspecified dementia without behavioral disturbance: Secondary | ICD-10-CM | POA: Diagnosis present

## 2024-02-29 DIAGNOSIS — Z6829 Body mass index (BMI) 29.0-29.9, adult: Secondary | ICD-10-CM

## 2024-02-29 DIAGNOSIS — I351 Nonrheumatic aortic (valve) insufficiency: Secondary | ICD-10-CM | POA: Diagnosis not present

## 2024-02-29 LAB — URINALYSIS, ROUTINE W REFLEX MICROSCOPIC
Bacteria, UA: NONE SEEN
Bilirubin Urine: NEGATIVE
Glucose, UA: NEGATIVE mg/dL
Ketones, ur: NEGATIVE mg/dL
Leukocytes,Ua: NEGATIVE
Nitrite: NEGATIVE
Protein, ur: 100 mg/dL — AB
Specific Gravity, Urine: >1.046 — ABNORMAL HIGH (ref 1.005–1.030)
pH: 7 (ref 5.0–8.0)

## 2024-02-29 LAB — COMPREHENSIVE METABOLIC PANEL WITH GFR
ALT: 13 U/L (ref 0–44)
AST: 17 U/L (ref 15–41)
Albumin: 3.5 g/dL (ref 3.5–5.0)
Alkaline Phosphatase: 63 U/L (ref 38–126)
Anion gap: 14 (ref 5–15)
BUN: 15 mg/dL (ref 8–23)
CO2: 24 mmol/L (ref 22–32)
Calcium: 9.3 mg/dL (ref 8.9–10.3)
Chloride: 102 mmol/L (ref 98–111)
Creatinine, Ser: 1.47 mg/dL — ABNORMAL HIGH (ref 0.61–1.24)
GFR, Estimated: 48 mL/min — ABNORMAL LOW (ref >60)
Glucose, Bld: 156 mg/dL — ABNORMAL HIGH (ref 70–99)
Potassium: 3.6 mmol/L (ref 3.5–5.1)
Sodium: 140 mmol/L (ref 135–145)
Total Bilirubin: 1.8 mg/dL — ABNORMAL HIGH (ref 0.0–1.2)
Total Protein: 7.8 g/dL (ref 6.5–8.1)

## 2024-02-29 LAB — PROTIME-INR
INR: 1.2 (ref 0.8–1.2)
Prothrombin Time: 15.7 s — ABNORMAL HIGH (ref 11.4–15.2)

## 2024-02-29 LAB — CBC
HCT: 41.9 % (ref 39.0–52.0)
Hemoglobin: 13.6 g/dL (ref 13.0–17.0)
MCH: 28.6 pg (ref 26.0–34.0)
MCHC: 32.5 g/dL (ref 30.0–36.0)
MCV: 88.2 fL (ref 80.0–100.0)
Platelets: 119 K/uL — ABNORMAL LOW (ref 150–400)
RBC: 4.75 MIL/uL (ref 4.22–5.81)
RDW: 15 % (ref 11.5–15.5)
WBC: 10.5 K/uL (ref 4.0–10.5)
nRBC: 0 % (ref 0.0–0.2)

## 2024-02-29 LAB — TROPONIN I (HIGH SENSITIVITY)
Troponin I (High Sensitivity): 16 ng/L (ref <18)
Troponin I (High Sensitivity): 21 ng/L — ABNORMAL HIGH (ref <18)

## 2024-02-29 LAB — CK: Total CK: 241 U/L (ref 49–397)

## 2024-02-29 LAB — CBG MONITORING, ED: Glucose-Capillary: 147 mg/dL — ABNORMAL HIGH (ref 70–99)

## 2024-02-29 MED ORDER — IOHEXOL 350 MG/ML SOLN
100.0000 mL | Freq: Once | INTRAVENOUS | Status: AC | PRN
  Administered 2024-02-29: 100 mL via INTRAVENOUS

## 2024-02-29 NOTE — ED Notes (Signed)
 Patient transported to MRI

## 2024-02-29 NOTE — ED Notes (Signed)
 Phleb to obtain labs

## 2024-02-29 NOTE — ED Provider Notes (Signed)
 [place] [facility] Provider Note   CSN: [id number] Arrival date & time: 02/29/24  1747   Patient presents with: Altered Mental Status   [name] [name] is a 78 y.o. male with PMHx of aortic aneurysm with prior thoracic aortic dissection, prior CVA, prior PE, HTN, HLD, GERD, DM, and CKD who presents for evaluation of AMS and lower extremity weakness. Per patient's wife, he has been acting unlike himself since last night at 8 PM, states that when she speaks to him he does not seem to register what she is saying.  She otherwise denies any changes in his speech or the patient himself not making any sense.  Patient was trying to change his Depend earlier this morning after defecating in it and spent almost 2 hours on the toilet attempting to put a new Depend on, was having increased difficulty due to weakness of one of his lower extremities.  Neither the patient nor the wife can recall which lower extremity was weak.  Patient does reportedly have bilateral leg weakness ever since his prior CVA.  Patient states that he does not currently feel confused and has no current complaints.  He states that he had transient chest pain 3 nights ago but no chest pain/shortness of breath since.  No N/V or bloody stools.    Prior to Admission medications   Medication Sig Start Date End Date Taking? Authorizing Provider  acetaminophen  (TYLENOL ) 500 MG tablet Take 1,000 mg by mouth every 6 (six) hours as needed for headache (pain).    [provider]  acyclovir (ZOVIRAX) 400 MG tablet 1 tablet 08/29/20   [provider]  amLODipine  (NORVASC ) 2.5 MG tablet Take 2.5 mg by mouth daily.  02/15/20   [provider]  Apoaequorin (PREVAGEN EXTRA STRENGTH PO) Take 20 mg by mouth daily.    [provider]  Ascorbic Acid (VITAMIN C) 500 MG CAPS Take 500 mg by mouth daily.     [provider]  aspirin  EC 81 MG tablet Take 81 mg by mouth daily.     [provider]  Calcium  Carbonate-Vitamin D (CALCIUM -D PO) Take 1 tablet by mouth daily.    [provider]  carvedilol  (COREG ) 25 MG tablet Take 1 tablet (25 mg total) by mouth 2 (two) times daily with a meal. 11/12/19   Angiulli, Toribio PARAS, PA-C  Cyanocobalamin  (VITAMIN B-12 PO) Take 1 tablet by mouth daily.    [provider]  hydrALAZINE  (APRESOLINE ) 10 MG tablet Take 1 tablet (10 mg total) by mouth every 6 (six) hours. 11/12/19   Angiulli, Toribio PARAS, PA-C  magnesium  citrate SOLN Take 1 Bottle by mouth daily as needed for severe constipation.     [provider]  memantine  (NAMENDA ) 10 MG tablet TAKE 1 TABLET BY MOUTH TWICE DAILY 06/10/23   Whitfield Raisin, NP  metFORMIN (GLUCOPHAGE) 500 MG tablet Take 500 mg by mouth 2 (two) times daily. 12/29/19   [provider]  Multiple Vitamin (MULTIVITAMIN WITH MINERALS) TABS tablet Take 1 tablet by mouth daily.    [provider]  Multiple Vitamins-Minerals (MULTI VITAMIN/MINERALS) TABS See admin instructions.    [provider]  pantoprazole  (PROTONIX ) 40 MG tablet Take 1 tablet (40 mg total) by mouth daily. 11/12/19   Angiulli, Toribio PARAS, PA-C  rosuvastatin  (CRESTOR ) 10 MG tablet Take 1 tablet (10 mg total) by mouth daily. 11/12/19   Angiulli, Toribio PARAS, PA-C  sildenafil (VIAGRA) 100 MG tablet 1 tablet  as needed    [provider]  tamsulosin  (FLOMAX ) 0.4 MG CAPS capsule Take 1 capsule (0.4 mg total) by mouth daily. 11/12/19   Angiulli, Toribio PARAS, PA-C  Vitamin D, Cholecalciferol, 10 MCG (400 UNIT) CHEW 1 tablet    [provider]    Allergies: Lyrica [pregabalin] and Sulfa antibiotics    Review of Systems  Updated Vital Signs BP (!) 181/99 (BP Location: Right Arm)   Pulse 81   Temp 98.3 F (36.8 C) (Oral)   Resp 16   Ht 5' 11 (1.803 m)   Wt 97.5 kg   SpO2 99%   BMI 29.98 kg/m   Physical Exam Vitals reviewed.  Constitutional:      General: He is not in acute  distress.    Appearance: He is not ill-appearing or diaphoretic.  HENT:     Head: Normocephalic and atraumatic.     Nose: Nose normal. No rhinorrhea.     Mouth/Throat:     Mouth: Mucous membranes are moist.     Pharynx: Oropharynx is clear.  Eyes:     General: No scleral icterus.    Extraocular Movements: Extraocular movements intact.     Pupils: Pupils are equal, round, and reactive to light.  Cardiovascular:     Rate and Rhythm: Normal rate and regular rhythm.     Heart sounds: No murmur heard.    No gallop.  Pulmonary:     Effort: Pulmonary effort is normal. No respiratory distress.     Breath sounds: Normal breath sounds.  Abdominal:     General: Abdomen is flat.     Palpations: Abdomen is soft.     Tenderness: There is no abdominal tenderness. There is no right CVA tenderness, left CVA tenderness or guarding.  Musculoskeletal:        General: No deformity. Normal range of motion.     Cervical back: Normal range of motion and neck supple. No rigidity.     Right lower leg: No edema.     Left lower leg: No edema.  Skin:    General: Skin is warm and dry.     Capillary Refill: Capillary refill takes less than 2 seconds.     Findings: No rash.  Neurological:     Mental Status: He is alert and oriented to [place], place, and time.     Cranial Nerves: No cranial nerve deficit.     Sensory: No sensory deficit.     Motor: No weakness.     (all labs ordered are listed, but only abnormal results are displayed) Labs Reviewed  COMPREHENSIVE METABOLIC PANEL WITH GFR - Abnormal; Notable for the following components:      Result Value   Glucose, Bld 156 (*)    Creatinine, Ser 1.47 (*)    Total Bilirubin 1.8 (*)    GFR, Estimated 48 (*)    All other components within normal limits  CBC - Abnormal; Notable for the following components:   Platelets 119 (*)    All other components within normal limits  PROTIME-INR - Abnormal; Notable for the following components:   Prothrombin  Time 15.7 (*)    All other components within normal limits  CBG MONITORING, ED - Abnormal; Notable for the following components:   Glucose-Capillary 147 (*)    All other components within normal limits  TROPONIN I (HIGH SENSITIVITY) - Abnormal; Notable for the following components:   Troponin I (High Sensitivity) 21 (*)    All other components within  normal limits  CK  URINALYSIS, ROUTINE W REFLEX MICROSCOPIC  TROPONIN I (HIGH SENSITIVITY)    EKG: EKG Interpretation Date/Time:  Saturday February 29 2024 17:55:49 EDT Ventricular Rate:  87 PR Interval:  184 QRS Duration:  73 QT Interval:  312 QTC Calculation: 376 R Axis:   34  Text Interpretation: Sinus rhythm Nonspecific repol abnormality, diffuse leads No significant change since last tracing Confirmed by Patt Alm DEL ([id number]) on 02/29/2024 6:55:05 PM  Radiology: CT Head Wo Contrast Result Date: 02/29/2024 CLINICAL DATA:  Altered mental status. Unknown cause. Also with bilateral leg weakness and loss of bowel control. The patient has a known abdominal aortic dissection and aneurysm. Acute aortic syndrome is suspected. EXAM: CT HEAD WITHOUT CONTRAST CT ANGIOGRAPHY CHEST, ABDOMEN AND PELVIS TECHNIQUE: Contiguous axial images were obtained from the base of the skull through the vertex without intravenous contrast. Multiplanar CT image reconstructions were also created and reviewed. Noncontrast axial CT of the chest was initially obtained. Multidetector CT imaging through the chest, abdomen and pelvis was performed using the standard protocol during bolus administration of intravenous contrast. Multiplanar reconstructed images and MIPs were obtained and reviewed to evaluate the vascular anatomy. RADIATION DOSE REDUCTION: This exam was performed according to the departmental dose-optimization program which includes automated exposure control, adjustment of the mA and/or kV according to patient size and/or use of iterative reconstruction technique.  CONTRAST:  OMNIPAQUE  IOHEXOL  350 MG/ML SOLN COMPARISON:  MRI brain from today, head CT from 05/07/2020, CT abdomen and pelvis with contrast 04/13/2021, and CTA chest, abdomen and pelvis 07/19/2020 FINDINGS: CT HEAD WITHOUT CONTRAST FINDINGS Brain: There is moderately developed cerebral atrophy with atrophic ventriculomegaly and moderate small vessel disease. Cerebellum and brainstem are unremarkable. No cortical based acute infarct, hemorrhage, mass or mass effect is seen. There is no midline shift.  Basal cisterns are clear. Vascular: No hyperdense vessels or unexpected calcification. Scattered calcification both siphons. Skull: Negative for fractures or focal lesions. Sinuses/orbits: Interval lens extractions new from 2021. Dysconjugate gaze. Otherwise negative orbits. Clear sinuses and mastoids. Other: None. CTA CHEST FINDINGS Cardiovascular: Endoluminal aortic stent graft again extends from the junction of the isthmus and descending segment ending in the hiatal segment, again with the distal tines incompletely apposed but unchanged in appearance. There is aortic atherosclerosis. The representative aortic measurements are as follows: Valve: 2.2 cm, previously 3 cm, on 11:92; Sinuses: 4.2 cm on 11:91, previously 4.6 cm; Sinotubular junction: 3.7 cm on 11:93, previously 3.8 cm; Mid ascending segment 4.3 x 4.3 cm on 11:98 and 12:102, previously 4.6 cm; Mid arch: 3.4 cm on 11:87; Isthmus: 3.5 cm on 12:128; Mid descending aorta: 5.0 cm AP and 5.4 cm transverse on 12:143 and 11:46, previously 4.3 cm; distal descending segment: 4.0 cm on 12:131, previously 3.8 cm. There is an increased, but noted previously type 1A endoleak from the posterior aspect of the proximal sealing zone, with at least a small type 2 contribution from an intercostal artery best seen on 7: 40-42. Contrast is seen in the excluded aneurysm sac along the anteromedial aspect to the level of T7, with increased the size of the descending aorta  compared to the prior study as described above. I do not see a thoracic aortic dissection. There is mild cardiomegaly, but less than in 2021, small pericardial effusion anteriorly. The pulmonary arteries are well opacified. No embolus is seen. There is IVC and hepatic vein reflux which may be seen with right heart dysfunction or tricuspid regurgitation. The pulmonary veins are  nondistended. Great vessels are tortuous but clear. Mediastinum/Nodes: Low-density fluid in the superior pericardial recess. No mediastinal hematoma. No adenopathy. Lungs/Pleura: Small layering left pleural effusion. The fluid measures 25 Hounsfield units slightly above simple fluid inferiorly, only measuring 7 Hounsfield units proximally. Pleural space hemorrhage would be expected to be denser and more heterogeneous but the pleural effusion etiology is not readily apparent. The lungs are otherwise clear. Central airways are patent. Musculoskeletal: Stable T8 vertebral body sclerotic lesion probable hemangioma. No acute or significant osseous findings. Review of the MIP images confirms the above findings. 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 There is communication between the true and false lumens at the inferior aspect. The false lumen is posterior to the right. The false lumen is patent but with increased near circumferential thrombus since the prior studies. 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 Celiac: Arises from the true lumen. Scattered calcific plaques. No stenosis, dissection or aneurysm. SMA: Dissection flap again extends into the SMA up to the first vessel main bifurcation, the true lumen supplies about 40% of the overall vessel diameter with the  false lumen up to 60%. The false lumen largely supplies the main right-sided SMA division and the true lumen supplies the remainder. Opacification of the false lumen is significantly less than of the true lumen. The false lumen was better opacified on the prior study than today. Renals: Supplied by the true lumen. Both are widely patent, small amount of negligible calcific plaque at both ostia. IMA: Patent without evidence of aneurysm, dissection, vasculitis or significant stenosis. Inflow: Patent without evidence of aneurysm, dissection, vasculitis or significant stenosis. There is moderate inflow vessel tortuosity which was seen previously, scattered calcific plaques. Veins: No obvious venous abnormality within the limitations of this arterial phase study. Review of the MIP images confirms the above findings. NON-VASCULAR Hepatobiliary: No focal liver abnormality is seen. No gallstones, gallbladder wall thickening, or biliary dilatation. Pancreas: Fatty infiltration. No mass, ductal dilatation or inflammatory change. Spleen: No abnormality. Adrenals/Urinary Tract: No renal mass enhancement.  No adrenal mass. There are a few bilateral too small to characterize Bosniak 2 cortical cysts. No follow-up imaging is recommended. There is contrast in the collecting systems. There previously was a 2 mm nonobstructing stone in the inferior pole of the right kidney. The contrast would obscure intrarenal stones today but there is no hydronephrosis, no ureteral filling defect. The bladder is moderately impressed upon by median lobe hypertrophy of the prostate. But has a normal wall thickness. Stomach/Bowel: Negative for dilatation or wall thickening including the appendix. Uncomplicated sigmoid diverticula. Lymphatic: No adenopathy. Reproductive: Severe prostatomegaly included the median lobe, maximum diameter 7.3 cm. Moderate impression into the bladder. Other: Right inguinal hernia contains a short loop of the distal small  bowel without evidence of hernia strangulation or incarceration. There is a small left inguinal fat hernia. No free fluid, free hemorrhage or free air. Musculoskeletal: Left-sided dorsal fusion construct with interbody hardware and mature fusion L4-5. No acute or significant osseous findings. Review of the MIP images confirms the above findings. IMPRESSION: 1. No acute intracranial CT findings. Stable atrophy and small-vessel changes. 2. Interval lens extractions/replacements with dysconjugate gaze. 3. Endoluminal aortic stent graft again extends from the junction of the isthmus and descending segment  ending in the hiatal segment, with increased type 1A endoleak from the posterior aspect of the proximal landing zone, and at least a small type 2 contribution from an intercostal artery. Contrast is seen in the excluded aneurysm sac along the anteromedial aspect to the level of T7, and the descending aorta has increased in size from 4.3 cm in 2021 to 5.0 x 5.4 cm today. 4. Abdominal aortic dissection again extends from just below the aortic stent graft into the infrarenal segment terminating above the aortic bifurcation. The false lumen is patent but with increased near circumferential thrombus since the prior studies. 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 Vascular surgery referral recommended unless already done. 5. Dissection flap again extends into the SMA up to the first main bifurcation, the true lumen supplies about 40% of the overall vessel diameter with the false lumen up to 60%. The false lumen largely supplies the main right-sided SMA division and the true lumen supplies the remainder. 6. Small layering left pleural effusion with fluid density slightly above simple fluid inferiorly. Pleural space hemorrhage would be expected to be denser and more heterogeneous but the pleural effusion etiology is not readily apparent. 7. Mild cardiomegaly, but less than in 2021, with  small pericardial effusion anteriorly. 8. IVC and hepatic vein reflux which may be seen with right heart dysfunction or tricuspid regurgitation. 9. Severe prostatomegaly including the median lobe, with moderate impression into the bladder. 10. Right inguinal hernia containing a short loop of the distal small bowel without evidence of hernia strangulation or incarceration. No upstream small bowel obstruction. 11. Small left inguinal fat hernia. 12. Uncomplicated diverticulosis. 13. Aortic atherosclerosis. Aortic Atherosclerosis (ICD10-I70.0). Electronically Signed   By: Francis Quam M.D.   On: 02/29/2024 23:00   CT Angio Chest/Abd/Pel for Dissection W and/or Wo Contrast Result Date: 02/29/2024 CLINICAL DATA:  Altered mental status. Unknown cause. Also with bilateral leg weakness and loss of bowel control. The patient has a known abdominal aortic dissection and aneurysm. Acute aortic syndrome is suspected. EXAM: CT HEAD WITHOUT CONTRAST CT ANGIOGRAPHY CHEST, ABDOMEN AND PELVIS TECHNIQUE: Contiguous axial images were obtained from the base of the skull through the vertex without intravenous contrast. Multiplanar CT image reconstructions were also created and reviewed. Noncontrast axial CT of the chest was initially obtained. Multidetector CT imaging through the chest, abdomen and pelvis was performed using the standard protocol during bolus administration of intravenous contrast. Multiplanar reconstructed images and MIPs were obtained and reviewed to evaluate the vascular anatomy. RADIATION DOSE REDUCTION: This exam was performed according to the departmental dose-optimization program which includes automated exposure control, adjustment of the mA and/or kV according to patient size and/or use of iterative reconstruction technique. CONTRAST:  OMNIPAQUE  IOHEXOL  350 MG/ML SOLN COMPARISON:  MRI brain from today, head CT from 05/07/2020, CT abdomen and pelvis with contrast 04/13/2021, and CTA chest, abdomen and  pelvis 07/19/2020 FINDINGS: CT HEAD WITHOUT CONTRAST FINDINGS Brain: There is moderately developed cerebral atrophy with atrophic ventriculomegaly and moderate small vessel disease. Cerebellum and brainstem are unremarkable. No cortical based acute infarct, hemorrhage, mass or mass effect is seen. There is no midline shift.  Basal cisterns are clear. Vascular: No hyperdense vessels or unexpected calcification. Scattered calcification both siphons. Skull: Negative for fractures or focal lesions. Sinuses/orbits: Interval lens extractions new from 2021. Dysconjugate gaze. Otherwise negative orbits. Clear sinuses and mastoids. Other: None. CTA CHEST FINDINGS Cardiovascular: Endoluminal aortic stent graft again extends from  the junction of the isthmus and descending segment ending in the hiatal segment, again with the distal tines incompletely apposed but unchanged in appearance. There is aortic atherosclerosis. The representative aortic measurements are as follows: Valve: 2.2 cm, previously 3 cm, on 11:92; Sinuses: 4.2 cm on 11:91, previously 4.6 cm; Sinotubular junction: 3.7 cm on 11:93, previously 3.8 cm; Mid ascending segment 4.3 x 4.3 cm on 11:98 and 12:102, previously 4.6 cm; Mid arch: 3.4 cm on 11:87; Isthmus: 3.5 cm on 12:128; Mid descending aorta: 5.0 cm AP and 5.4 cm transverse on 12:143 and 11:46, previously 4.3 cm; distal descending segment: 4.0 cm on 12:131, previously 3.8 cm. There is an increased, but noted previously type 1A endoleak from the posterior aspect of the proximal sealing zone, with at least a small type 2 contribution from an intercostal artery best seen on 7: 40-42. Contrast is seen in the excluded aneurysm sac along the anteromedial aspect to the level of T7, with increased the size of the descending aorta compared to the prior study as described above. I do not see a thoracic aortic dissection. There is mild cardiomegaly, but less than in 2021, small pericardial effusion anteriorly. The  pulmonary arteries are well opacified. No embolus is seen. There is IVC and hepatic vein reflux which may be seen with right heart dysfunction or tricuspid regurgitation. The pulmonary veins are nondistended. Great vessels are tortuous but clear. Mediastinum/Nodes: Low-density fluid in the superior pericardial recess. No mediastinal hematoma. No adenopathy. Lungs/Pleura: Small layering left pleural effusion. The fluid measures 25 Hounsfield units slightly above simple fluid inferiorly, only measuring 7 Hounsfield units proximally. Pleural space hemorrhage would be expected to be denser and more heterogeneous but the pleural effusion etiology is not readily apparent. The lungs are otherwise clear. Central airways are patent. Musculoskeletal: Stable T8 vertebral body sclerotic lesion probable hemangioma. No acute or significant osseous findings. Review of the MIP images confirms the above findings. 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 There is communication between the true and false lumens at the inferior aspect. The false lumen is posterior to the right. The false lumen is patent but with increased near circumferential thrombus since the prior studies. 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 Celiac: Arises from the true lumen. Scattered calcific plaques. No stenosis, dissection or aneurysm. SMA: Dissection flap again extends into the SMA up to the first vessel main bifurcation, the true lumen supplies about 40% of the overall vessel diameter with the false lumen up to 60%. The false lumen largely supplies the main right-sided SMA division and the true lumen supplies the remainder. Opacification of the false lumen is significantly less  than of the true lumen. The false lumen was better opacified on the prior study than today. Renals: Supplied by the true lumen. Both are widely patent, small amount of negligible calcific plaque at both ostia. IMA: Patent without evidence of aneurysm, dissection, vasculitis or significant stenosis. Inflow: Patent without evidence of aneurysm, dissection, vasculitis or significant stenosis. There is moderate inflow vessel tortuosity which was seen previously, scattered calcific plaques. Veins: No obvious venous abnormality within the limitations of this arterial phase study. Review of the MIP images confirms the above findings. NON-VASCULAR Hepatobiliary: No focal liver abnormality is seen. No gallstones, gallbladder  wall thickening, or biliary dilatation. Pancreas: Fatty infiltration. No mass, ductal dilatation or inflammatory change. Spleen: No abnormality. Adrenals/Urinary Tract: No renal mass enhancement.  No adrenal mass. There are a few bilateral too small to characterize Bosniak 2 cortical cysts. No follow-up imaging is recommended. There is contrast in the collecting systems. There previously was a 2 mm nonobstructing stone in the inferior pole of the right kidney. The contrast would obscure intrarenal stones today but there is no hydronephrosis, no ureteral filling defect. The bladder is moderately impressed upon by median lobe hypertrophy of the prostate. But has a normal wall thickness. Stomach/Bowel: Negative for dilatation or wall thickening including the appendix. Uncomplicated sigmoid diverticula. Lymphatic: No adenopathy. Reproductive: Severe prostatomegaly included the median lobe, maximum diameter 7.3 cm. Moderate impression into the bladder. Other: Right inguinal hernia contains a short loop of the distal small bowel without evidence of hernia strangulation or incarceration. There is a small left inguinal fat hernia. No free fluid, free hemorrhage or free air. Musculoskeletal: Left-sided dorsal  fusion construct with interbody hardware and mature fusion L4-5. No acute or significant osseous findings. Review of the MIP images confirms the above findings. IMPRESSION: 1. No acute intracranial CT findings. Stable atrophy and small-vessel changes. 2. Interval lens extractions/replacements with dysconjugate gaze. 3. Endoluminal aortic stent graft again extends from the junction of the isthmus and descending segment ending in the hiatal segment, with increased type 1A endoleak from the posterior aspect of the proximal landing zone, and at least a small type 2 contribution from an intercostal artery. Contrast is seen in the excluded aneurysm sac along the anteromedial aspect to the level of T7, and the descending aorta has increased in size from 4.3 cm in 2021 to 5.0 x 5.4 cm today. 4. Abdominal aortic dissection again extends from just below the aortic stent graft into the infrarenal segment terminating above the aortic bifurcation. The false lumen is patent but with increased near circumferential thrombus since the prior studies. 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 Vascular surgery referral recommended unless already done. 5. Dissection flap again extends into the SMA up to the first main bifurcation, the true lumen supplies about 40% of the overall vessel diameter with the false lumen up to 60%. The false lumen largely supplies the main right-sided SMA division and the true lumen supplies the remainder. 6. Small layering left pleural effusion with fluid density slightly above simple fluid inferiorly. Pleural space hemorrhage would be expected to be denser and more heterogeneous but the pleural effusion etiology is not readily apparent. 7. Mild cardiomegaly, but less than in 2021, with small pericardial effusion anteriorly. 8. IVC and hepatic vein reflux which may be seen with right heart dysfunction or tricuspid regurgitation. 9. Severe prostatomegaly including the  median lobe, with moderate impression into the bladder. 10. Right inguinal hernia containing a short loop of the distal small bowel without evidence of hernia strangulation or incarceration. No upstream small bowel obstruction. 11. Small left inguinal fat hernia. 12. Uncomplicated diverticulosis. 13. Aortic atherosclerosis. Aortic Atherosclerosis (ICD10-I70.0). Electronically Signed   By: Francis Quam M.D.   On: 02/29/2024 23:00   MR ANGIO HEAD WO CONTRAST Result Date: 02/29/2024 CLINICAL DATA:  Neuro deficit, acute, stroke suspected EXAM: MRI HEAD WITHOUT CONTRAST MRA HEAD WITHOUT CONTRAST TECHNIQUE: Multiplanar, multiecho pulse sequences of the brain and surrounding structures were obtained without intravenous contrast. Angiographic images of the Circle of Willis were obtained using MRA technique without  intravenous contrast. COMPARISON:  None Available. FINDINGS: MRI HEAD FINDINGS Brain: No acute infarction, hemorrhage, hydrocephalus, extra-axial collection or mass lesion. 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 Mild for age scattered T2/FLAIR hyperintensity white matter, compatible with chronic microvascular ischemic disease. Cerebral atrophy. Vascular: See below. Skull and upper cervical spine: Normal marrow signal. Sinuses/Orbits: Clear sinuses.  No acute orbital findings. Other: No mastoid effusions. MRA HEAD FINDINGS Anterior circulation: Bilateral intracranial ICAs, MCAs, and ACAs are patent without proximal hemodynamically significant stenosis. No aneurysm identified. Posterior circulation: Bilateral intradural vertebral arteries, basilar artery, and bilateral posterior arteries are patent without proximal hemodynamically significant stenosis. No aneurysm identified. IMPRESSION: 1. No evidence of acute intracranial abnormality. 2. No large vessel occlusion or proximal hemodynamically significant stenosis. Electronically Signed   By: Gilmore GORMAN Molt M.D.   On: 02/29/2024  21:50   MR BRAIN WO CONTRAST Result Date: 02/29/2024 CLINICAL DATA:  Neuro deficit, acute, stroke suspected EXAM: MRI HEAD WITHOUT CONTRAST MRA HEAD WITHOUT CONTRAST TECHNIQUE: Multiplanar, multiecho pulse sequences of the brain and surrounding structures were obtained without intravenous contrast. Angiographic images of the Circle of Willis were obtained using MRA technique without intravenous contrast. COMPARISON:  None Available. FINDINGS: MRI HEAD FINDINGS Brain: No acute infarction, hemorrhage, hydrocephalus, extra-axial collection or mass lesion. 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 Mild for age scattered T2/FLAIR hyperintensity white matter, compatible with chronic microvascular ischemic disease. Cerebral atrophy. Vascular: See below. Skull and upper cervical spine: Normal marrow signal. Sinuses/Orbits: Clear sinuses.  No acute orbital findings. Other: No mastoid effusions. MRA HEAD FINDINGS Anterior circulation: Bilateral intracranial ICAs, MCAs, and ACAs are patent without proximal hemodynamically significant stenosis. No aneurysm identified. Posterior circulation: Bilateral intradural vertebral arteries, basilar artery, and bilateral posterior arteries are patent without proximal hemodynamically significant stenosis. No aneurysm identified. IMPRESSION: 1. No evidence of acute intracranial abnormality. 2. No large vessel occlusion or proximal hemodynamically significant stenosis. Electronically Signed   By: Gilmore GORMAN Molt M.D.   On: 02/29/2024 21:50     Medications Ordered in the ED  iohexol  (OMNIPAQUE ) 350 MG/ML injection 100 mL (100 mLs Intravenous Contrast Given 02/29/24 2144)    Clinical Course as of 02/29/24 2333  Sat Feb 29, 2024  2210 MR ANGIO HEAD WO CONTRAST No evidence of acute intracranial abnormality. No large vessel occlusion or proximal hemodynamically significant stenosis.   [AD]  2211 MR BRAIN WO CONTRAST No acute infarction, hemorrhage,  hydrocephalus, extra-axial collection or mass lesion. 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   [AD]  2316 Trops 16 -> 21 [AD]  2316 CBC(!) Unremarkable aside from mild thrombocytopenia to 119. [AD]    Clinical Course User Index [AD] Raoul Rake, MD    Medical Decision Making Amount and/or Complexity of Data Reviewed Labs: ordered. Decision-making details documented in ED Course. Radiology: ordered. Decision-making details documented in ED Course.    Details: See above for detailed results of CT dissection study.  Risk Prescription drug management.   ***  {Document critical care time when appropriate  Document review of labs and clinical decision tools ie [id number], etc  Document your independent review of radiology images and any outside records  Document your discussion with family members, caretakers and with consultants  Document social determinants of health affecting pt's care  Document your decision making why or why not admission, treatments were needed:[id number]:::1}   Final diagnoses:  None    ED Discharge Orders     None

## 2024-02-29 NOTE — ED Triage Notes (Signed)
 Patient arrives via [facility] for altered mental status. Patient found on toilet for approximately two hours. Wife states he started acting different last night at 2000. Patient defecated on self at 0830 this am which is not normal. Bilat leg weakness but daughter endorses this has been same since stroke. Usually alert and able to answer questions.  20 LAC  EMS vitals CBG 198 HR 87 O2 98 on room air  BP 176/102

## 2024-02-29 NOTE — Discharge Instructions (Signed)
 You were seen today for confusion and leg weakness. While you were here we monitored your vitals, preformed a physical exam, and labs + CT imaging. These were all reassuring and there is no indication for any further testing or intervention in the emergency department at this time.   Things to do:  - Follow up with your primary care provider within the next 1-2 weeks - Schedule an appointment with vascular surgery clinic (their office should call you to schedule appointment in the next few days)  Return to the emergency department if you have any new or worsening symptoms including severe abdominal or back pain, worsening confusion or leg weakness, or if you have any other serious medical concerns.

## 2024-02-29 NOTE — ED Notes (Signed)
 Patient transported to CT

## 2024-03-01 ENCOUNTER — Inpatient Hospital Stay (HOSPITAL_COMMUNITY)

## 2024-03-01 ENCOUNTER — Encounter (HOSPITAL_COMMUNITY): Payer: Self-pay | Admitting: Internal Medicine

## 2024-03-01 DIAGNOSIS — Z1152 Encounter for screening for COVID-19: Secondary | ICD-10-CM | POA: Diagnosis not present

## 2024-03-01 DIAGNOSIS — I639 Cerebral infarction, unspecified: Secondary | ICD-10-CM | POA: Diagnosis not present

## 2024-03-01 DIAGNOSIS — J309 Allergic rhinitis, unspecified: Secondary | ICD-10-CM | POA: Diagnosis not present

## 2024-03-01 DIAGNOSIS — E876 Hypokalemia: Secondary | ICD-10-CM | POA: Diagnosis present

## 2024-03-01 DIAGNOSIS — R569 Unspecified convulsions: Secondary | ICD-10-CM | POA: Diagnosis not present

## 2024-03-01 DIAGNOSIS — Z6829 Body mass index (BMI) 29.0-29.9, adult: Secondary | ICD-10-CM | POA: Diagnosis not present

## 2024-03-01 DIAGNOSIS — R4182 Altered mental status, unspecified: Secondary | ICD-10-CM | POA: Diagnosis not present

## 2024-03-01 DIAGNOSIS — G459 Transient cerebral ischemic attack, unspecified: Secondary | ICD-10-CM | POA: Diagnosis not present

## 2024-03-01 DIAGNOSIS — E663 Overweight: Secondary | ICD-10-CM | POA: Diagnosis present

## 2024-03-01 DIAGNOSIS — R2689 Other abnormalities of gait and mobility: Secondary | ICD-10-CM | POA: Diagnosis not present

## 2024-03-01 DIAGNOSIS — I674 Hypertensive encephalopathy: Secondary | ICD-10-CM | POA: Diagnosis not present

## 2024-03-01 DIAGNOSIS — E1122 Type 2 diabetes mellitus with diabetic chronic kidney disease: Secondary | ICD-10-CM | POA: Diagnosis not present

## 2024-03-01 DIAGNOSIS — E569 Vitamin deficiency, unspecified: Secondary | ICD-10-CM | POA: Diagnosis not present

## 2024-03-01 DIAGNOSIS — F0390 Unspecified dementia without behavioral disturbance: Secondary | ICD-10-CM | POA: Diagnosis present

## 2024-03-01 DIAGNOSIS — E1140 Type 2 diabetes mellitus with diabetic neuropathy, unspecified: Secondary | ICD-10-CM | POA: Diagnosis not present

## 2024-03-01 DIAGNOSIS — I714 Abdominal aortic aneurysm, without rupture, unspecified: Secondary | ICD-10-CM | POA: Diagnosis present

## 2024-03-01 DIAGNOSIS — M6281 Muscle weakness (generalized): Secondary | ICD-10-CM | POA: Diagnosis not present

## 2024-03-01 DIAGNOSIS — Z95828 Presence of other vascular implants and grafts: Secondary | ICD-10-CM | POA: Diagnosis not present

## 2024-03-01 DIAGNOSIS — Z743 Need for continuous supervision: Secondary | ICD-10-CM | POA: Diagnosis not present

## 2024-03-01 DIAGNOSIS — I351 Nonrheumatic aortic (valve) insufficiency: Secondary | ICD-10-CM | POA: Diagnosis not present

## 2024-03-01 DIAGNOSIS — G9340 Encephalopathy, unspecified: Secondary | ICD-10-CM | POA: Diagnosis present

## 2024-03-01 DIAGNOSIS — I10 Essential (primary) hypertension: Secondary | ICD-10-CM | POA: Diagnosis not present

## 2024-03-01 DIAGNOSIS — G9349 Other encephalopathy: Secondary | ICD-10-CM | POA: Diagnosis not present

## 2024-03-01 DIAGNOSIS — I7143 Infrarenal abdominal aortic aneurysm, without rupture: Secondary | ICD-10-CM | POA: Diagnosis not present

## 2024-03-01 DIAGNOSIS — K219 Gastro-esophageal reflux disease without esophagitis: Secondary | ICD-10-CM | POA: Diagnosis not present

## 2024-03-01 DIAGNOSIS — M519 Unspecified thoracic, thoracolumbar and lumbosacral intervertebral disc disorder: Secondary | ICD-10-CM | POA: Diagnosis not present

## 2024-03-01 DIAGNOSIS — Z79899 Other long term (current) drug therapy: Secondary | ICD-10-CM | POA: Diagnosis not present

## 2024-03-01 DIAGNOSIS — E7800 Pure hypercholesterolemia, unspecified: Secondary | ICD-10-CM | POA: Diagnosis present

## 2024-03-01 DIAGNOSIS — N179 Acute kidney failure, unspecified: Secondary | ICD-10-CM | POA: Diagnosis not present

## 2024-03-01 DIAGNOSIS — E8809 Other disorders of plasma-protein metabolism, not elsewhere classified: Secondary | ICD-10-CM | POA: Diagnosis not present

## 2024-03-01 DIAGNOSIS — R531 Weakness: Secondary | ICD-10-CM | POA: Diagnosis not present

## 2024-03-01 DIAGNOSIS — I715 Thoracoabdominal aortic aneurysm, ruptured, unspecified: Secondary | ICD-10-CM | POA: Diagnosis not present

## 2024-03-01 DIAGNOSIS — I1310 Hypertensive heart and chronic kidney disease without heart failure, with stage 1 through stage 4 chronic kidney disease, or unspecified chronic kidney disease: Secondary | ICD-10-CM | POA: Diagnosis not present

## 2024-03-01 DIAGNOSIS — I700 Atherosclerosis of aorta: Secondary | ICD-10-CM | POA: Diagnosis not present

## 2024-03-01 DIAGNOSIS — E785 Hyperlipidemia, unspecified: Secondary | ICD-10-CM | POA: Diagnosis not present

## 2024-03-01 DIAGNOSIS — K4020 Bilateral inguinal hernia, without obstruction or gangrene, not specified as recurrent: Secondary | ICD-10-CM | POA: Diagnosis not present

## 2024-03-01 DIAGNOSIS — I7102 Dissection of abdominal aorta: Secondary | ICD-10-CM | POA: Diagnosis not present

## 2024-03-01 DIAGNOSIS — I7100 Dissection of unspecified site of aorta: Secondary | ICD-10-CM | POA: Diagnosis not present

## 2024-03-01 DIAGNOSIS — Z7984 Long term (current) use of oral hypoglycemic drugs: Secondary | ICD-10-CM | POA: Diagnosis not present

## 2024-03-01 DIAGNOSIS — G8929 Other chronic pain: Secondary | ICD-10-CM | POA: Diagnosis not present

## 2024-03-01 DIAGNOSIS — E1159 Type 2 diabetes mellitus with other circulatory complications: Secondary | ICD-10-CM | POA: Diagnosis not present

## 2024-03-01 DIAGNOSIS — R7989 Other specified abnormal findings of blood chemistry: Secondary | ICD-10-CM | POA: Diagnosis not present

## 2024-03-01 DIAGNOSIS — D696 Thrombocytopenia, unspecified: Secondary | ICD-10-CM | POA: Diagnosis not present

## 2024-03-01 DIAGNOSIS — E1136 Type 2 diabetes mellitus with diabetic cataract: Secondary | ICD-10-CM | POA: Diagnosis not present

## 2024-03-01 DIAGNOSIS — N400 Enlarged prostate without lower urinary tract symptoms: Secondary | ICD-10-CM | POA: Diagnosis present

## 2024-03-01 DIAGNOSIS — N1831 Chronic kidney disease, stage 3a: Secondary | ICD-10-CM | POA: Diagnosis not present

## 2024-03-01 DIAGNOSIS — J9811 Atelectasis: Secondary | ICD-10-CM | POA: Diagnosis not present

## 2024-03-01 DIAGNOSIS — R918 Other nonspecific abnormal finding of lung field: Secondary | ICD-10-CM | POA: Diagnosis not present

## 2024-03-01 DIAGNOSIS — K5790 Diverticulosis of intestine, part unspecified, without perforation or abscess without bleeding: Secondary | ICD-10-CM | POA: Diagnosis not present

## 2024-03-01 DIAGNOSIS — F17210 Nicotine dependence, cigarettes, uncomplicated: Secondary | ICD-10-CM | POA: Diagnosis present

## 2024-03-01 DIAGNOSIS — I2699 Other pulmonary embolism without acute cor pulmonale: Secondary | ICD-10-CM | POA: Diagnosis not present

## 2024-03-01 DIAGNOSIS — Z7401 Bed confinement status: Secondary | ICD-10-CM | POA: Diagnosis not present

## 2024-03-01 DIAGNOSIS — E119 Type 2 diabetes mellitus without complications: Secondary | ICD-10-CM | POA: Diagnosis not present

## 2024-03-01 LAB — SEDIMENTATION RATE: Sed Rate: 68 mm/h — ABNORMAL HIGH (ref 0–16)

## 2024-03-01 LAB — AMMONIA: Ammonia: 28 umol/L (ref 9–35)

## 2024-03-01 LAB — CBC
HCT: 37 % — ABNORMAL LOW (ref 39.0–52.0)
Hemoglobin: 11.8 g/dL — ABNORMAL LOW (ref 13.0–17.0)
MCH: 28.7 pg (ref 26.0–34.0)
MCHC: 31.9 g/dL (ref 30.0–36.0)
MCV: 90 fL (ref 80.0–100.0)
Platelets: 135 K/uL — ABNORMAL LOW (ref 150–400)
RBC: 4.11 MIL/uL — ABNORMAL LOW (ref 4.22–5.81)
RDW: 15 % (ref 11.5–15.5)
WBC: 9.6 K/uL (ref 4.0–10.5)
nRBC: 0 % (ref 0.0–0.2)

## 2024-03-01 LAB — TROPONIN I (HIGH SENSITIVITY)
Troponin I (High Sensitivity): 22 ng/L — ABNORMAL HIGH (ref <18)
Troponin I (High Sensitivity): 20 ng/L — ABNORMAL HIGH (ref <18)
Troponin I (High Sensitivity): 13 ng/L (ref <18)
Troponin I (High Sensitivity): 17 ng/L (ref <18)

## 2024-03-01 LAB — LACTIC ACID, PLASMA
Lactic Acid, Venous: 1.3 mmol/L (ref 0.5–1.9)
Lactic Acid, Venous: 1.3 mmol/L (ref 0.5–1.9)

## 2024-03-01 LAB — I-STAT VENOUS BLOOD GAS, ED
Acid-Base Excess: 4 mmol/L — ABNORMAL HIGH (ref 0.0–2.0)
Bicarbonate: 26.2 mmol/L (ref 20.0–28.0)
Calcium, Ion: 1.07 mmol/L — ABNORMAL LOW (ref 1.15–1.40)
HCT: 38 % — ABNORMAL LOW (ref 39.0–52.0)
Hemoglobin: 12.9 g/dL — ABNORMAL LOW (ref 13.0–17.0)
O2 Saturation: 74 %
Potassium: 3.3 mmol/L — ABNORMAL LOW (ref 3.5–5.1)
Sodium: 141 mmol/L (ref 135–145)
TCO2: 27 mmol/L (ref 22–32)
pCO2, Ven: 31.7 mmHg — ABNORMAL LOW (ref 44–60)
pH, Ven: 7.526 — ABNORMAL HIGH (ref 7.25–7.43)
pO2, Ven: 34 mmHg (ref 32–45)

## 2024-03-01 LAB — RESPIRATORY PANEL BY PCR

## 2024-03-01 LAB — BASIC METABOLIC PANEL WITH GFR
Anion gap: 13 (ref 5–15)
BUN: 13 mg/dL (ref 8–23)
CO2: 22 mmol/L (ref 22–32)
Calcium: 9 mg/dL (ref 8.9–10.3)
Chloride: 106 mmol/L (ref 98–111)
Creatinine, Ser: 1.43 mg/dL — ABNORMAL HIGH (ref 0.61–1.24)
GFR, Estimated: 50 mL/min — ABNORMAL LOW (ref >60)
Glucose, Bld: 154 mg/dL — ABNORMAL HIGH (ref 70–99)
Potassium: 3.3 mmol/L — ABNORMAL LOW (ref 3.5–5.1)
Sodium: 141 mmol/L (ref 135–145)

## 2024-03-01 LAB — RAPID URINE DRUG SCREEN, HOSP PERFORMED
Amphetamines: NOT DETECTED
Barbiturates: NOT DETECTED
Benzodiazepines: NOT DETECTED
Cocaine: NOT DETECTED
Opiates: NOT DETECTED
Tetrahydrocannabinol: NOT DETECTED

## 2024-03-01 LAB — ACETAMINOPHEN LEVEL: Acetaminophen (Tylenol), Serum: <10 ug/mL — ABNORMAL LOW (ref 10–30)

## 2024-03-01 LAB — RESP PANEL BY RT-PCR (RSV, FLU A&B, COVID)  RVPGX2
Influenza A by PCR: NEGATIVE
Influenza B by PCR: NEGATIVE
Resp Syncytial Virus by PCR: NEGATIVE
SARS Coronavirus 2 by RT PCR: NEGATIVE

## 2024-03-01 LAB — SALICYLATE LEVEL: Salicylate Lvl: <7 mg/dL — ABNORMAL LOW (ref 7.0–30.0)

## 2024-03-01 LAB — GLUCOSE, CAPILLARY: Glucose-Capillary: 170 mg/dL — ABNORMAL HIGH (ref 70–99)

## 2024-03-01 LAB — PROCALCITONIN: Procalcitonin: <0.1 ng/mL

## 2024-03-01 LAB — C-REACTIVE PROTEIN: CRP: 22 mg/dL — ABNORMAL HIGH (ref <1.0)

## 2024-03-01 LAB — TSH: TSH: 1.065 u[IU]/mL (ref 0.350–4.500)

## 2024-03-01 MED ORDER — HEPARIN SODIUM (PORCINE) 5000 UNIT/ML IJ SOLN
5000.0000 [IU] | Freq: Three times a day (TID) | INTRAMUSCULAR | Status: DC
  Administered 2024-03-01 – 2024-03-05 (×14): 5000 [IU] via SUBCUTANEOUS
  Filled 2024-03-01 (×16): qty 1

## 2024-03-01 MED ORDER — HYDRALAZINE HCL 10 MG PO TABS
10.0000 mg | ORAL_TABLET | Freq: Four times a day (QID) | ORAL | Status: DC
  Administered 2024-03-01 – 2024-03-05 (×18): 10 mg via ORAL
  Filled 2024-03-01 (×19): qty 1

## 2024-03-01 MED ORDER — CARVEDILOL 25 MG PO TABS
25.0000 mg | ORAL_TABLET | Freq: Two times a day (BID) | ORAL | Status: DC
  Administered 2024-03-01 – 2024-03-05 (×9): 25 mg via ORAL
  Filled 2024-03-01 (×3): qty 1
  Filled 2024-03-01: qty 2
  Filled 2024-03-01 (×5): qty 1

## 2024-03-01 MED ORDER — SODIUM CHLORIDE 0.9 % IV SOLN: INTRAVENOUS | Status: AC

## 2024-03-01 MED ORDER — ROSUVASTATIN CALCIUM 5 MG PO TABS
10.0000 mg | ORAL_TABLET | Freq: Every day | ORAL | Status: DC
  Administered 2024-03-01 – 2024-03-05 (×5): 10 mg via ORAL
  Filled 2024-03-01 (×5): qty 2

## 2024-03-01 MED ORDER — ASPIRIN 81 MG PO TBEC
81.0000 mg | DELAYED_RELEASE_TABLET | Freq: Every day | ORAL | Status: DC
  Administered 2024-03-01 – 2024-03-05 (×5): 81 mg via ORAL
  Filled 2024-03-01 (×5): qty 1

## 2024-03-01 MED ORDER — PANTOPRAZOLE SODIUM 40 MG PO TBEC
40.0000 mg | DELAYED_RELEASE_TABLET | Freq: Every day | ORAL | Status: DC
  Administered 2024-03-01 – 2024-03-05 (×5): 40 mg via ORAL
  Filled 2024-03-01 (×5): qty 1

## 2024-03-01 MED ORDER — POTASSIUM CHLORIDE CRYS ER 20 MEQ PO TBCR
40.0000 meq | EXTENDED_RELEASE_TABLET | Freq: Two times a day (BID) | ORAL | Status: AC
  Administered 2024-03-01 – 2024-03-02 (×2): 40 meq via ORAL
  Filled 2024-03-01: qty 4
  Filled 2024-03-01: qty 2

## 2024-03-01 MED ORDER — AMLODIPINE BESYLATE 5 MG PO TABS
2.5000 mg | ORAL_TABLET | Freq: Every day | ORAL | Status: DC
Start: 2024-03-01 — End: 2024-03-04
  Administered 2024-03-01 – 2024-03-03 (×3): 2.5 mg via ORAL
  Filled 2024-03-01 (×3): qty 1

## 2024-03-01 NOTE — Consult Note (Signed)
 [facility]  Patient name: [name] [name] MRN: [id number] DOB: 04/16/1946 Sex: male   REQUESTING PROVIDER:    ER   REASON FOR CONSULT:    TAAA  HISTORY OF PRESENT ILLNESS:   [name] [name] is a 78 y.o. male, who presented to the emergency department yesterday with weakness in his legs.  He was unable to get off of the toilet.  He was unable to walk in the hospital last night.  He is also been confused over the past 24 hours.  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  The patient is not having any abdominal pain or chest pain.  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 Sheree in 2021.  He does suffer from dementia.  He is medically managed for hypertension.  He takes a statin for hypercholesterolemia.  He is a former smoker.  PAST MEDICAL HISTORY    Past Medical History:  Diagnosis Date   Chronic back pain    stenosis   Enlarged prostate    GERD (gastroesophageal reflux disease)    occasionally will take a zantac(maybe once a month)   History of colon polyps    History of kidney stones    History of stress test    done 10 yrs. ago, as a baseline    Hyperlipidemia    takes Crestor  daily   Hypertension    takes Amlodipine  daily and Lotensin  as well   Stroke [facility])      FAMILY HISTORY   No family history on file.  SOCIAL HISTORY:   Social History   Socioeconomic History   Marital status: Married    Spouse name: Not on file   Number of children: Not on file   Years of education: Not on file   Highest education level: Not on file  Occupational History   Not on file  Tobacco Use   Smoking status: Former    Current packs/day: 0.25    Average packs/day: 0.3 packs/day for 40.0 years (10.0 ttl pk-yrs)    Types: Cigarettes   Smokeless tobacco: Never   Tobacco  comments:    none since 06/24/13  Vaping Use   Vaping status: Never Used  Substance and Sexual Activity   Alcohol use: No   Drug use: No   Sexual activity: Not on file  Other Topics Concern   Not on file  Social History Narrative   Not on file   Social Drivers of Health   Financial Resource Strain: Not on file  Food Insecurity: Not on file  Transportation Needs: Not on file  Physical Activity: Not on file  Stress: Not on file  Social Connections: Not on file  Intimate Partner Violence: Not on file    ALLERGIES:    Allergies  Allergen Reactions   Lyrica [Pregabalin] Nausea Only and Other (See Comments)    Hallucinations Dizziness    Sulfa Antibiotics Other (See Comments)    Unknown reaction    CURRENT MEDICATIONS:    Current Facility-Administered Medications  Medication Dose Route Frequency Provider Last Rate Last Admin   0.9 %  sodium chloride  infusion   Intravenous Continuous Debby Camila LABOR, MD 75 mL/hr at 03/01/24 0940 New Bag at 03/01/24 0940   amLODipine  (NORVASC ) tablet 2.5 mg  2.5 mg Oral Daily Debby Camila LABOR, MD   2.5  mg at 03/01/24 0940   aspirin  EC tablet 81 mg  81 mg Oral Daily Debby Hitch A, MD   81 mg at 03/01/24 9057   carvedilol  (COREG ) tablet 25 mg  25 mg Oral BID WC Debby Hitch A, MD   25 mg at 03/01/24 0941   heparin  injection 5,000 Units  5,000 Units Subcutaneous Q8H Debby Hitch A, MD   5,000 Units at 03/01/24 1311   hydrALAZINE  (APRESOLINE ) tablet 10 mg  10 mg Oral Q6H Debby Hitch LABOR, MD   10 mg at 03/01/24 1310   pantoprazole  (PROTONIX ) EC tablet 40 mg  40 mg Oral Daily Debby Hitch LABOR, MD   40 mg at 03/01/24 0941   rosuvastatin  (CRESTOR ) tablet 10 mg  10 mg Oral Daily Debby Hitch LABOR, MD   10 mg at 03/01/24 9057    REVIEW OF SYSTEMS:   [X]  denotes positive finding, [ ]  denotes negative finding Cardiac  Comments:  Chest pain or chest pressure:    Shortness of breath upon exertion:    Short of breath  when lying flat:    Irregular heart rhythm:        Vascular    Pain in calf, thigh, or hip brought on by ambulation:    Pain in feet at night that wakes you up from your sleep:     Blood clot in your veins:    Leg swelling:         Pulmonary    Oxygen at home:    Productive cough:     Wheezing:         Neurologic    Sudden weakness in arms or legs:     Sudden numbness in arms or legs:     Sudden onset of difficulty speaking or slurred speech:    Temporary loss of vision in one eye:     Problems with dizziness:         Gastrointestinal    Blood in stool:      Vomited blood:         Genitourinary    Burning when urinating:     Blood in urine:        Psychiatric    Major depression:         Hematologic    Bleeding problems:    Problems with blood clotting too easily:        Skin    Rashes or ulcers:        Constitutional    Fever or chills:     PHYSICAL EXAM:   Vitals:   03/01/24 0219 03/01/24 0645 03/01/24 1009 03/01/24 1144  BP: (!) 173/104  (!) 154/100 130/74  Pulse: 73  71 67  Resp: 17  (!) 30 18  Temp: 98.2 F (36.8 C) 98.3 F (36.8 C)  98.4 F (36.9 C)  TempSrc: Oral Oral  Oral  SpO2: 98%  98% 100%  Weight:    97.5 kg  Height:    5' 10.98 (1.803 m)    GENERAL: The patient is a well-nourished male, in no acute distress. The vital signs are documented above. CARDIAC: There is a regular rate and rhythm.  VASCULAR: Palpable pedal pulses bilaterally. PULMONARY: Nonlabored respirations ABDOMEN: Soft and non-tender   MUSCULOSKELETAL: There are no major deformities or cyanosis. NEUROLOGIC: No focal weakness or paresthesias are detected. SKIN: There are no ulcers or rashes noted. PSYCHIATRIC: Pleasantly confused  STUDIES:   I have reviewed his CT scan with the following findings:  1. No acute intracranial CT findings. Stable atrophy and small-vessel changes. 2. Interval lens extractions/replacements with dysconjugate gaze. 3. Endoluminal aortic  stent graft again extends from the junction of the isthmus and descending segment ending in the hiatal segment, with increased type 1A endoleak from the posterior aspect of the proximal landing zone, and at least a small type 2 contribution from an intercostal artery. Contrast is seen in the excluded aneurysm sac along the anteromedial aspect to the level of T7, and the descending aorta has increased in size from 4.3 cm in 2021 to 5.0 x 5.4 cm today. 4. Abdominal aortic dissection again extends from just below the aortic stent graft into the infrarenal segment terminating above the aortic bifurcation. The false lumen is patent but with increased near circumferential thrombus since the prior studies. 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 Vascular surgery referral recommended unless already done. 5. Dissection flap again extends into the SMA up to the first main bifurcation, the true lumen supplies about 40% of the overall vessel diameter with the false lumen up to 60%. The false lumen largely supplies the main right-sided SMA division and the true lumen supplies the remainder. 6. Small layering left pleural effusion with fluid density slightly above simple fluid inferiorly. Pleural space hemorrhage would be expected to be denser and more heterogeneous but the pleural effusion etiology is not readily apparent. 7. Mild cardiomegaly, but less than in 2021, with small pericardial effusion anteriorly. 8. IVC and hepatic vein reflux which may be seen with right heart dysfunction or tricuspid regurgitation. 9. Severe prostatomegaly including the median lobe, with moderate impression into the bladder. 10. Right inguinal hernia containing a short loop of the distal small bowel without evidence of hernia strangulation or incarceration. No upstream small bowel obstruction. 11. Small left inguinal fat hernia. 12. Uncomplicated diverticulosis. 13. Aortic  atherosclerosis.  ASSESSMENT and PLAN   Aortic dissection: The patient underwent stenting of the entry tear in 2021 and has not had any further follow-up.  His CT scan shows aneurysmal progression in the infrarenal aorta to 5.7 cm.  He is a symptom diabetic.  He is currently undergoing a workup for his lower extremity weakness.  We will follow-up and discuss plans for possible treatment of his aneurysmal degeneration, which can be done electively since he is pain-free.   Malvina Serene CLORE, MD, FACS [facility] [phone] Pager [phone]

## 2024-03-01 NOTE — ED Provider Notes (Addendum)
 Assumed care at midnight.  See prior note for full H&P.  Briefly, 78 year old male here with altered mental status and generalized weakness.  Unable to get off the toilet for approximately 2 hours.  MRI here without findings of stroke.  Labs are grossly reassuring.  UA without findings of UTI.    Has a known AAA so dissection study obtain-- endoleak present, was there previously as well.  Known dissection, also has enlarging aneurysm.    Prior team discussed with vascular, Dr. Serene-- nothing emergent.  If able to ambulate could be discharged with OP follow-up.  Results for orders placed or performed during the hospital encounter of 02/29/24  Comprehensive metabolic panel   Collection Time: 02/29/24  7:03 PM  Result Value Ref Range   Sodium 140 135 - 145 mmol/L   Potassium 3.6 3.5 - 5.1 mmol/L   Chloride 102 98 - 111 mmol/L   CO2 24 22 - 32 mmol/L   Glucose, Bld 156 (H) 70 - 99 mg/dL   BUN 15 8 - 23 mg/dL   Creatinine, Ser 8.52 (H) 0.61 - 1.24 mg/dL   Calcium  9.3 8.9 - 10.3 mg/dL   Total Protein 7.8 6.5 - 8.1 g/dL   Albumin 3.5 3.5 - 5.0 g/dL   AST 17 15 - 41 U/L   ALT 13 0 - 44 U/L   Alkaline Phosphatase 63 38 - 126 U/L   Total Bilirubin 1.8 (H) 0.0 - 1.2 mg/dL   GFR, Estimated 48 (L) >60 mL/min   Anion gap 14 5 - 15  CBC   Collection Time: 02/29/24  7:03 PM  Result Value Ref Range   WBC 10.5 4.0 - 10.5 K/uL   RBC 4.75 4.22 - 5.81 MIL/uL   Hemoglobin 13.6 13.0 - 17.0 g/dL   HCT 58.0 60.9 - 47.9 %   MCV 88.2 80.0 - 100.0 fL   MCH 28.6 26.0 - 34.0 pg   MCHC 32.5 30.0 - 36.0 g/dL   RDW 84.9 88.4 - 84.4 %   Platelets 119 (L) 150 - 400 K/uL   nRBC 0.0 0.0 - 0.2 %  Protime-INR   Collection Time: 02/29/24  7:03 PM  Result Value Ref Range   Prothrombin Time 15.7 (H) 11.4 - 15.2 seconds   INR 1.2 0.8 - 1.2  CK   Collection Time: 02/29/24  7:04 PM  Result Value Ref Range   Total CK 241 49 - 397 U/L  CBG monitoring, ED   Collection Time: 02/29/24  7:04 PM  Result Value Ref  Range   Glucose-Capillary 147 (H) 70 - 99 mg/dL  Troponin I (High Sensitivity)   Collection Time: 02/29/24  7:04 PM  Result Value Ref Range   Troponin I (High Sensitivity) 16 <18 ng/L  Troponin I (High Sensitivity)   Collection Time: 02/29/24 10:06 PM  Result Value Ref Range   Troponin I (High Sensitivity) 21 (H) <18 ng/L  Urinalysis, Routine w reflex microscopic -Urine, Clean Catch   Collection Time: 02/29/24 10:20 PM  Result Value Ref Range   Color, Urine YELLOW YELLOW   APPearance CLEAR CLEAR   Specific Gravity, Urine >1.046 (H) 1.005 - 1.030   pH 7.0 5.0 - 8.0   Glucose, UA NEGATIVE NEGATIVE mg/dL   Hgb urine dipstick SMALL (A) NEGATIVE   Bilirubin Urine NEGATIVE NEGATIVE   Ketones, ur NEGATIVE NEGATIVE mg/dL   Protein, ur 899 (A) NEGATIVE mg/dL   Nitrite NEGATIVE NEGATIVE   Leukocytes,Ua NEGATIVE NEGATIVE   RBC / HPF 0-5  0 - 5 RBC/hpf   WBC, UA 0-5 0 - 5 WBC/hpf   Bacteria, UA NONE SEEN NONE SEEN   Squamous Epithelial / HPF 0-5 0 - 5 /HPF   CT Head Wo Contrast Result Date: 02/29/2024 CLINICAL DATA:  Altered mental status. Unknown cause. Also with bilateral leg weakness and loss of bowel control. The patient has a known abdominal aortic dissection and aneurysm. Acute aortic syndrome is suspected. EXAM: CT HEAD WITHOUT CONTRAST CT ANGIOGRAPHY CHEST, ABDOMEN AND PELVIS TECHNIQUE: Contiguous axial images were obtained from the base of the skull through the vertex without intravenous contrast. Multiplanar CT image reconstructions were also created and reviewed. Noncontrast axial CT of the chest was initially obtained. Multidetector CT imaging through the chest, abdomen and pelvis was performed using the standard protocol during bolus administration of intravenous contrast. Multiplanar reconstructed images and MIPs were obtained and reviewed to evaluate the vascular anatomy. RADIATION DOSE REDUCTION: This exam was performed according to the departmental dose-optimization program which  includes automated exposure control, adjustment of the mA and/or kV according to patient size and/or use of iterative reconstruction technique. CONTRAST:  OMNIPAQUE  IOHEXOL  350 MG/ML SOLN COMPARISON:  MRI brain from today, head CT from 05/07/2020, CT abdomen and pelvis with contrast 04/13/2021, and CTA chest, abdomen and pelvis 07/19/2020 FINDINGS: CT HEAD WITHOUT CONTRAST FINDINGS Brain: There is moderately developed cerebral atrophy with atrophic ventriculomegaly and moderate small vessel disease. Cerebellum and brainstem are unremarkable. No cortical based acute infarct, hemorrhage, mass or mass effect is seen. There is no midline shift.  Basal cisterns are clear. Vascular: No hyperdense vessels or unexpected calcification. Scattered calcification both siphons. Skull: Negative for fractures or focal lesions. Sinuses/orbits: Interval lens extractions new from 2021. Dysconjugate gaze. Otherwise negative orbits. Clear sinuses and mastoids. Other: None. CTA CHEST FINDINGS Cardiovascular: Endoluminal aortic stent graft again extends from the junction of the isthmus and descending segment ending in the hiatal segment, again with the distal tines incompletely apposed but unchanged in appearance. There is aortic atherosclerosis. The representative aortic measurements are as follows: Valve: 2.2 cm, previously 3 cm, on 11:92; Sinuses: 4.2 cm on 11:91, previously 4.6 cm; Sinotubular junction: 3.7 cm on 11:93, previously 3.8 cm; Mid ascending segment 4.3 x 4.3 cm on 11:98 and 12:102, previously 4.6 cm; Mid arch: 3.4 cm on 11:87; Isthmus: 3.5 cm on 12:128; Mid descending aorta: 5.0 cm AP and 5.4 cm transverse on 12:143 and 11:46, previously 4.3 cm; distal descending segment: 4.0 cm on 12:131, previously 3.8 cm. There is an increased, but noted previously type 1A endoleak from the posterior aspect of the proximal sealing zone, with at least a small type 2 contribution from an intercostal artery best seen on 7: 40-42.  Contrast is seen in the excluded aneurysm sac along the anteromedial aspect to the level of T7, with increased the size of the descending aorta compared to the prior study as described above. I do not see a thoracic aortic dissection. There is mild cardiomegaly, but less than in 2021, small pericardial effusion anteriorly. The pulmonary arteries are well opacified. No embolus is seen. There is IVC and hepatic vein reflux which may be seen with right heart dysfunction or tricuspid regurgitation. The pulmonary veins are nondistended. Great vessels are tortuous but clear. Mediastinum/Nodes: Low-density fluid in the superior pericardial recess. No mediastinal hematoma. No adenopathy. Lungs/Pleura: Small layering left pleural effusion. The fluid measures 25 Hounsfield units slightly above simple fluid inferiorly, only measuring 7 Hounsfield units proximally. Pleural space hemorrhage would  be expected to be denser and more heterogeneous but the pleural effusion etiology is not readily apparent. The lungs are otherwise clear. Central airways are patent. Musculoskeletal: Stable T8 vertebral body sclerotic lesion probable hemangioma. No acute or significant osseous findings. Review of the MIP images confirms the above findings. 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 There is communication between the true and false lumens at the inferior aspect. The false lumen is posterior to the right. The false lumen is patent but with increased near circumferential thrombus since the prior studies. 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 Celiac: Arises from the true lumen. Scattered calcific plaques. No stenosis, dissection or aneurysm. SMA: Dissection flap  again extends into the SMA up to the first vessel main bifurcation, the true lumen supplies about 40% of the overall vessel diameter with the false lumen up to 60%. The false lumen largely supplies the main right-sided SMA division and the true lumen supplies the remainder. Opacification of the false lumen is significantly less than of the true lumen. The false lumen was better opacified on the prior study than today. Renals: Supplied by the true lumen. Both are widely patent, small amount of negligible calcific plaque at both ostia. IMA: Patent without evidence of aneurysm, dissection, vasculitis or significant stenosis. Inflow: Patent without evidence of aneurysm, dissection, vasculitis or significant stenosis. There is moderate inflow vessel tortuosity which was seen previously, scattered calcific plaques. Veins: No obvious venous abnormality within the limitations of this arterial phase study. Review of the MIP images confirms the above findings. NON-VASCULAR Hepatobiliary: No focal liver abnormality is seen. No gallstones, gallbladder wall thickening, or biliary dilatation. Pancreas: Fatty infiltration. No mass, ductal dilatation or inflammatory change. Spleen: No abnormality. Adrenals/Urinary Tract: No renal mass enhancement.  No adrenal mass. There are a few bilateral too small to characterize Bosniak 2 cortical cysts. No follow-up imaging is recommended. There is contrast in the collecting systems. There previously was a 2 mm nonobstructing stone in the inferior pole of the right kidney. The contrast would obscure intrarenal stones today but there is no hydronephrosis, no ureteral filling defect. The bladder is moderately impressed upon by median lobe hypertrophy of the prostate. But has a normal wall thickness. Stomach/Bowel: Negative for dilatation or wall thickening including the appendix. Uncomplicated sigmoid diverticula. Lymphatic: No adenopathy. Reproductive: Severe prostatomegaly included the median  lobe, maximum diameter 7.3 cm. Moderate impression into the bladder. Other: Right inguinal hernia contains a short loop of the distal small bowel without evidence of hernia strangulation or incarceration. There is a small left inguinal fat hernia. No free fluid, free hemorrhage or free air. Musculoskeletal: Left-sided dorsal fusion construct with interbody hardware and mature fusion L4-5. No acute or significant osseous findings. Review of the MIP images confirms the above findings. IMPRESSION: 1. No acute intracranial CT findings. Stable atrophy and small-vessel changes. 2. Interval lens extractions/replacements with dysconjugate gaze. 3. Endoluminal aortic stent graft again extends from the junction of the isthmus and descending segment ending in the hiatal segment, with increased type 1A endoleak from the posterior aspect of the proximal landing zone, and at least a small type 2 contribution from an intercostal artery. Contrast is seen in the excluded aneurysm sac along the anteromedial aspect to the level of  T7, and the descending aorta has increased in size from 4.3 cm in 2021 to 5.0 x 5.4 cm today. 4. Abdominal aortic dissection again extends from just below the aortic stent graft into the infrarenal segment terminating above the aortic bifurcation. The false lumen is patent but with increased near circumferential thrombus since the prior studies. 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 Vascular surgery referral recommended unless already done. 5. Dissection flap again extends into the SMA up to the first main bifurcation, the true lumen supplies about 40% of the overall vessel diameter with the false lumen up to 60%. The false lumen largely supplies the main right-sided SMA division and the true lumen supplies the remainder. 6. Small layering left pleural effusion with fluid density slightly above simple fluid inferiorly. Pleural space hemorrhage would be expected to be  denser and more heterogeneous but the pleural effusion etiology is not readily apparent. 7. Mild cardiomegaly, but less than in 2021, with small pericardial effusion anteriorly. 8. IVC and hepatic vein reflux which may be seen with right heart dysfunction or tricuspid regurgitation. 9. Severe prostatomegaly including the median lobe, with moderate impression into the bladder. 10. Right inguinal hernia containing a short loop of the distal small bowel without evidence of hernia strangulation or incarceration. No upstream small bowel obstruction. 11. Small left inguinal fat hernia. 12. Uncomplicated diverticulosis. 13. Aortic atherosclerosis. Aortic Atherosclerosis (ICD10-I70.0). Electronically Signed   By: Francis Quam M.D.   On: 02/29/2024 23:00   CT Angio Chest/Abd/Pel for Dissection W and/or Wo Contrast Result Date: 02/29/2024 CLINICAL DATA:  Altered mental status. Unknown cause. Also with bilateral leg weakness and loss of bowel control. The patient has a known abdominal aortic dissection and aneurysm. Acute aortic syndrome is suspected. EXAM: CT HEAD WITHOUT CONTRAST CT ANGIOGRAPHY CHEST, ABDOMEN AND PELVIS TECHNIQUE: Contiguous axial images were obtained from the base of the skull through the vertex without intravenous contrast. Multiplanar CT image reconstructions were also created and reviewed. Noncontrast axial CT of the chest was initially obtained. Multidetector CT imaging through the chest, abdomen and pelvis was performed using the standard protocol during bolus administration of intravenous contrast. Multiplanar reconstructed images and MIPs were obtained and reviewed to evaluate the vascular anatomy. RADIATION DOSE REDUCTION: This exam was performed according to the departmental dose-optimization program which includes automated exposure control, adjustment of the mA and/or kV according to patient size and/or use of iterative reconstruction technique. CONTRAST:  OMNIPAQUE  IOHEXOL  350 MG/ML  SOLN COMPARISON:  MRI brain from today, head CT from 05/07/2020, CT abdomen and pelvis with contrast 04/13/2021, and CTA chest, abdomen and pelvis 07/19/2020 FINDINGS: CT HEAD WITHOUT CONTRAST FINDINGS Brain: There is moderately developed cerebral atrophy with atrophic ventriculomegaly and moderate small vessel disease. Cerebellum and brainstem are unremarkable. No cortical based acute infarct, hemorrhage, mass or mass effect is seen. There is no midline shift.  Basal cisterns are clear. Vascular: No hyperdense vessels or unexpected calcification. Scattered calcification both siphons. Skull: Negative for fractures or focal lesions. Sinuses/orbits: Interval lens extractions new from 2021. Dysconjugate gaze. Otherwise negative orbits. Clear sinuses and mastoids. Other: None. CTA CHEST FINDINGS Cardiovascular: Endoluminal aortic stent graft again extends from the junction of the isthmus and descending segment ending in the hiatal segment, again with the distal tines incompletely apposed but unchanged in appearance. There is aortic atherosclerosis. The representative aortic measurements are as follows: Valve: 2.2 cm, previously 3 cm, on 11:92; Sinuses: 4.2 cm on  11:91, previously 4.6 cm; Sinotubular junction: 3.7 cm on 11:93, previously 3.8 cm; Mid ascending segment 4.3 x 4.3 cm on 11:98 and 12:102, previously 4.6 cm; Mid arch: 3.4 cm on 11:87; Isthmus: 3.5 cm on 12:128; Mid descending aorta: 5.0 cm AP and 5.4 cm transverse on 12:143 and 11:46, previously 4.3 cm; distal descending segment: 4.0 cm on 12:131, previously 3.8 cm. There is an increased, but noted previously type 1A endoleak from the posterior aspect of the proximal sealing zone, with at least a small type 2 contribution from an intercostal artery best seen on 7: 40-42. Contrast is seen in the excluded aneurysm sac along the anteromedial aspect to the level of T7, with increased the size of the descending aorta compared to the prior study as described above.  I do not see a thoracic aortic dissection. There is mild cardiomegaly, but less than in 2021, small pericardial effusion anteriorly. The pulmonary arteries are well opacified. No embolus is seen. There is IVC and hepatic vein reflux which may be seen with right heart dysfunction or tricuspid regurgitation. The pulmonary veins are nondistended. Great vessels are tortuous but clear. Mediastinum/Nodes: Low-density fluid in the superior pericardial recess. No mediastinal hematoma. No adenopathy. Lungs/Pleura: Small layering left pleural effusion. The fluid measures 25 Hounsfield units slightly above simple fluid inferiorly, only measuring 7 Hounsfield units proximally. Pleural space hemorrhage would be expected to be denser and more heterogeneous but the pleural effusion etiology is not readily apparent. The lungs are otherwise clear. Central airways are patent. Musculoskeletal: Stable T8 vertebral body sclerotic lesion probable hemangioma. No acute or significant osseous findings. Review of the MIP images confirms the above findings. 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 There is communication between the true and false lumens at the inferior aspect. The false lumen is posterior to the right. The false lumen is patent but with increased near circumferential thrombus since the prior studies. 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 Celiac: Arises from the true lumen. Scattered calcific plaques. No stenosis, dissection or aneurysm. SMA: Dissection flap again extends into the SMA up to the first vessel main bifurcation, the true lumen supplies about 40% of the overall vessel diameter with the false lumen up to 60%. The false lumen largely  supplies the main right-sided SMA division and the true lumen supplies the remainder. Opacification of the false lumen is significantly less than of the true lumen. The false lumen was better opacified on the prior study than today. Renals: Supplied by the true lumen. Both are widely patent, small amount of negligible calcific plaque at both ostia. IMA: Patent without evidence of aneurysm, dissection, vasculitis or significant stenosis. Inflow: Patent without evidence of aneurysm, dissection, vasculitis or significant stenosis. There is moderate inflow vessel tortuosity which was seen previously, scattered calcific plaques. Veins: No obvious venous abnormality within the limitations of this arterial phase study. Review of the MIP images confirms the above findings. NON-VASCULAR Hepatobiliary: No focal liver abnormality is seen. No gallstones, gallbladder wall thickening, or biliary dilatation. Pancreas: Fatty infiltration. No mass, ductal dilatation or inflammatory change. Spleen: No abnormality. Adrenals/Urinary Tract: No renal mass enhancement.  No adrenal mass. There are a few bilateral too small to characterize Bosniak 2 cortical cysts. No follow-up imaging is recommended. There  is contrast in the collecting systems. There previously was a 2 mm nonobstructing stone in the inferior pole of the right kidney. The contrast would obscure intrarenal stones today but there is no hydronephrosis, no ureteral filling defect. The bladder is moderately impressed upon by median lobe hypertrophy of the prostate. But has a normal wall thickness. Stomach/Bowel: Negative for dilatation or wall thickening including the appendix. Uncomplicated sigmoid diverticula. Lymphatic: No adenopathy. Reproductive: Severe prostatomegaly included the median lobe, maximum diameter 7.3 cm. Moderate impression into the bladder. Other: Right inguinal hernia contains a short loop of the distal small bowel without evidence of hernia strangulation or  incarceration. There is a small left inguinal fat hernia. No free fluid, free hemorrhage or free air. Musculoskeletal: Left-sided dorsal fusion construct with interbody hardware and mature fusion L4-5. No acute or significant osseous findings. Review of the MIP images confirms the above findings. IMPRESSION: 1. No acute intracranial CT findings. Stable atrophy and small-vessel changes. 2. Interval lens extractions/replacements with dysconjugate gaze. 3. Endoluminal aortic stent graft again extends from the junction of the isthmus and descending segment ending in the hiatal segment, with increased type 1A endoleak from the posterior aspect of the proximal landing zone, and at least a small type 2 contribution from an intercostal artery. Contrast is seen in the excluded aneurysm sac along the anteromedial aspect to the level of T7, and the descending aorta has increased in size from 4.3 cm in 2021 to 5.0 x 5.4 cm today. 4. Abdominal aortic dissection again extends from just below the aortic stent graft into the infrarenal segment terminating above the aortic bifurcation. The false lumen is patent but with increased near circumferential thrombus since the prior studies. 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 Vascular surgery referral recommended unless already done. 5. Dissection flap again extends into the SMA up to the first main bifurcation, the true lumen supplies about 40% of the overall vessel diameter with the false lumen up to 60%. The false lumen largely supplies the main right-sided SMA division and the true lumen supplies the remainder. 6. Small layering left pleural effusion with fluid density slightly above simple fluid inferiorly. Pleural space hemorrhage would be expected to be denser and more heterogeneous but the pleural effusion etiology is not readily apparent. 7. Mild cardiomegaly, but less than in 2021, with small pericardial effusion anteriorly. 8. IVC and  hepatic vein reflux which may be seen with right heart dysfunction or tricuspid regurgitation. 9. Severe prostatomegaly including the median lobe, with moderate impression into the bladder. 10. Right inguinal hernia containing a short loop of the distal small bowel without evidence of hernia strangulation or incarceration. No upstream small bowel obstruction. 11. Small left inguinal fat hernia. 12. Uncomplicated diverticulosis. 13. Aortic atherosclerosis. Aortic Atherosclerosis (ICD10-I70.0). Electronically Signed   By: Francis Quam M.D.   On: 02/29/2024 23:00    Failed ambulatory trial here.  Patient lives at home with elderly wife who physically cannot help him all that much.  Will admit.  Messaged vascular to notify for IP consult.  3:59 AM Discussed with hospitalist, Dr. Debby-- declined admission.  States vascular can see in the ED in AM and let us  know if he needs admission for or if he is stable for OP follow-up.  If vascular deems stable for OP, recommends TOC consult and placement from ER.  I have expressed that I do not feel this is in the best interest of the patient  as he has multiple comorbidities and changes seen on CT today.  Dr. Debby reports that if vascular deems indication for admission, morning team can admit at that time.  I have messaged Dr. Serene to have AM team evaluate here in the ED.  5:43 AM Hospitalist, Dr. Debby actually did go see patient-- per patient's wife not back to baseline.  Concerned for possible encephalopathy.  Requested to add on ammonia, UDS, APAP, salicylate levels which have been done.  She will admit now.   Jarold Olam HERO, PA-C 03/01/24 0542    Jarold Olam HERO, PA-C 03/01/24 0545    Emil Share, DO 03/01/24 [phone]

## 2024-03-01 NOTE — Progress Notes (Signed)
 EEG complete - results pending

## 2024-03-01 NOTE — Procedures (Signed)
 Patient Name: Hakim Minniefield Cyphers  MRN: [id number]  Epilepsy Attending: Arlin MALVA Krebs  Referring Physician/Provider: Debby Camila LABOR, MD  Date: 03/01/2024 Duration: 22.02 mins  Patient history: 78yo M with ams. EEG to evaluate for seizure  Level of alertness: Awake  AEDs during EEG study: None  Technical aspects: This EEG study was done with scalp electrodes positioned according to the 10-20 International system of electrode placement. Electrical activity was reviewed with band pass filter of 1-70Hz , sensitivity of 7 uV/mm, display speed of 44mm/sec with a 60Hz  notched filter applied as appropriate. EEG data were recorded continuously and digitally stored.  Video monitoring was available and reviewed as appropriate.  Description: The posterior dominant rhythm consists of 8 Hz activity of moderate voltage (25-35 uV) seen predominantly in posterior head regions, symmetric and reactive to eye opening and eye closing. EEG showed intermittent generalized 3 to 6 Hz theta-delta slowing. Hyperventilation and photic stimulation were not performed.     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 No seizures or epileptiform discharges were seen throughout the recording.  Purcell Jungbluth O Shannen Vernon

## 2024-03-01 NOTE — H&P (Addendum)
 History and Physical    [name] [name] FMW:[id number] DOB: Aug 30, 1945 DOA: 02/29/2024  PCP: [name] Other, MD  Patient coming from: home  I have personally briefly reviewed patient's old medical records in [facility]  Chief Complaint: change in mental status and weakness  HPI: [name] [name] is a 78 y.o. male with medical history significant of  Chronic back pain , GERD, HLD, HTN , AAA s/p repair with endo leak,Dementia, who presents to ED with with confusion x 3 that has been progressive over the last  24 hours and now also associated with weakness x hours PTA. Per wife patient was unable to get up from commode due to weakness. He was also having difficulty following commands and was noted to be very confused due to this EMS was called.  Patient is currently Oriented x  to [place] but does not know the year or month.  Per wife this is not his baseline. Patient also denies any fever chills, dysuria, abdominal. He does denies any sob or cough but does note sick contact in grand children who has diarrheal illness.  ED Course:  Patient on evaluation in ED was noted to be confused from baseline and had global weakness.  Labs unremarkable ext mild AKI . Patient is admitted for further evaluation of encephalopathy and weakness.  Afeb bp 140/108, hr 86, rr 18, sat 98%  EKG: NSR no hyperacute st -twave changes Wbc 10.5, hgb 13.6,  plt 119  Na 140, K K 3.6,  glu 156, T bili 1.8 cr 1.4 ( 1.17) CE 16, 21 TCK 241  MRI head and neck MPRESSION: 1. No evidence of acute intracranial abnormality. 2. No large vessel occlusion or proximal hemodynamically significant stenosis.  CTA of abdomen Endoluminal aortic stent graft again extends from the junction of the isthmus and descending segment ending in the hiatal segment, with increased type 1A endoleak from the posterior aspect of the proximal landing zone, and at least a small type 2 contribution from an intercostal artery. Contrast is  seen in the excluded aneurysm sac along the anteromedial aspect to the level of T7, and the descending aorta has increased in size from 4.3 cm in 2021 to 5.0 x 5.4 cm today. Abdominal aortic dissection again extends from just below the aortic stent graft into the infrarenal segment terminating above the aortic bifurcation. The false lumen is patent but with increased near circumferential thrombus since the prior studies. 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 Vascular surgery referral recommended unless already done. 5. Dissection flap again extends into the SMA up to the first main bifurcation, the true lumen supplies about 40% of the overall vessel diameter with the false lumen up to 60%. The false lumen largely supplies the main right-sided SMA division and the true lumen supplies the remainder.   UA neg Cxr /UDS pending   Review of Systems: As per HPI otherwise 10 point review of systems negative.   Past Medical History:  Diagnosis Date   Chronic back pain    stenosis   Enlarged prostate    GERD (gastroesophageal reflux disease)    occasionally will take a zantac(maybe once a month)   History of colon polyps    History of kidney stones    History of stress test    done 10 yrs. ago, as a baseline    Hyperlipidemia    takes Crestor  daily   Hypertension    takes  Amlodipine  daily and Lotensin  as well   Stroke [facility])     Past Surgical History:  Procedure Laterality Date   ANTERIOR LAT LUMBAR FUSION Left 12/15/2014   Procedure: ANTERIOR LATERAL LUMBAR FUSION 1 LEVEL;  Surgeon: Oneil Priestly, MD;  Location: [facility] OR;  Service: Orthopedics;  Laterality: Left;  Left sided lateral lumbar interbody fusion, lumbar 3-4, posterior spinal fusion, lumbar 3-4 with instrumentation.   COLONOSCOPY     ESOPHAGOGASTRODUODENOSCOPY     fatty tissue removed from stomach     LUMBAR LAMINECTOMY/DECOMPRESSION MICRODISCECTOMY N/A 07/08/2013   Procedure: LUMBAR  LAMINECTOMY/DECOMPRESSION MICRODISCECTOMY;  Surgeon: Oneil Rodgers Priestly, MD;  Location: [facility] OR;  Service: Orthopedics;  Laterality: N/A;  Lumbar 3-4, lumbar 4-5 decompression   RADIOLOGY WITH ANESTHESIA N/A 10/22/2019   Procedure: MRI WITH ANESTHESIA OF LUMBAR SPINE WITHOUT CONTRAST;  Surgeon: Radiologist, Medication, MD;  Location: [facility] OR;  Service: Radiology;  Laterality: N/A;   right ankle surgery     as child    THORACIC AORTIC ENDOVASCULAR STENT GRAFT N/A 10/15/2019   Procedure: THORACIC AORTIC ENDOVASCULAR STENT GRAFT;  Surgeon: Sheree Penne Bruckner, MD;  Location: [facility] OR;  Service: Vascular;  Laterality: N/A;   TONSILLECTOMY     as a child     reports that he has quit smoking. His smoking use included cigarettes. He has a 10 pack-year smoking history. He has never used smokeless tobacco. He reports that he does not drink alcohol and does not use drugs.  Allergies  Allergen Reactions   Lyrica [Pregabalin] Nausea Only and [name] (See Comments)    Hallucinations and dizziness, also   Sulfa Antibiotics [name] (See Comments)    Unknown reaction    No family history on file.  Prior to Admission medications   Medication Sig Start Date End Date Taking? Authorizing Provider  acetaminophen  (TYLENOL ) 500 MG tablet Take 1,000 mg by mouth every 6 (six) hours as needed for headache (pain).    [provider]  acyclovir (ZOVIRAX) 400 MG tablet 1 tablet 08/29/20   [provider]  amLODipine  (NORVASC ) 2.5 MG tablet Take 2.5 mg by mouth daily.  02/15/20   [provider]  Apoaequorin (PREVAGEN EXTRA STRENGTH PO) Take 20 mg by mouth daily.    [provider]  Ascorbic Acid (VITAMIN C) 500 MG CAPS Take 500 mg by mouth daily.     [provider]  aspirin  EC 81 MG tablet Take 81 mg by mouth daily.    [provider]  Calcium  Carbonate-Vitamin D (CALCIUM -D PO) Take 1 tablet by mouth daily.    [provider]  carvedilol  (COREG ) 25 MG  tablet Take 1 tablet (25 mg total) by mouth 2 (two) times daily with a meal. 11/12/19   Angiulli, Toribio PARAS, PA-C  Cyanocobalamin  (VITAMIN B-12 PO) Take 1 tablet by mouth daily.    [provider]  hydrALAZINE  (APRESOLINE ) 10 MG tablet Take 1 tablet (10 mg total) by mouth every 6 (six) hours. 11/12/19   Angiulli, Toribio PARAS, PA-C  magnesium  citrate SOLN Take 1 Bottle by mouth daily as needed for severe constipation.     [provider]  memantine  (NAMENDA ) 10 MG tablet TAKE 1 TABLET BY MOUTH TWICE DAILY 06/10/23   Whitfield Raisin, NP  metFORMIN (GLUCOPHAGE) 500 MG tablet Take 500 mg by mouth 2 (two) times daily. 12/29/19   [provider]  Multiple Vitamin (MULTIVITAMIN WITH MINERALS) TABS tablet Take 1 tablet by mouth daily.    [provider]  Multiple  Vitamins-Minerals (MULTI VITAMIN/MINERALS) TABS See admin instructions.    [provider]  pantoprazole  (PROTONIX ) 40 MG tablet Take 1 tablet (40 mg total) by mouth daily. 11/12/19   Angiulli, Toribio PARAS, PA-C  rosuvastatin  (CRESTOR ) 10 MG tablet Take 1 tablet (10 mg total) by mouth daily. 11/12/19   Angiulli, Toribio PARAS, PA-C  sildenafil (VIAGRA) 100 MG tablet 1 tablet as needed    [provider]  tamsulosin  (FLOMAX ) 0.4 MG CAPS capsule Take 1 capsule (0.4 mg total) by mouth daily. 11/12/19   Angiulli, Toribio PARAS, PA-C  Vitamin D, Cholecalciferol, 10 MCG (400 UNIT) CHEW 1 tablet    [provider]    Physical Exam: Vitals:   02/29/24 1830 02/29/24 1915 02/29/24 2257 03/01/24 0219  BP: (!) 148/96 (!) 144/106 (!) 181/99 (!) 173/104  Pulse: 83 84 81 73  Resp: (!) 27 (!) 23 16 17   Temp:   98.3 F (36.8 C) 98.2 F (36.8 C)  TempSrc:   Oral Oral  SpO2: 97% 98% 99% 98%  Weight:      Height:        Constitutional: NAD, calm, comfortable Vitals:   02/29/24 1830 02/29/24 1915 02/29/24 2257 03/01/24 0219  BP: (!) 148/96 (!) 144/106 (!) 181/99 (!) 173/104  Pulse: 83 84 81 73  Resp: (!) 27 (!)  23 16 17   Temp:   98.3 F (36.8 C) 98.2 F (36.8 C)  TempSrc:   Oral Oral  SpO2: 97% 98% 99% 98%  Weight:      Height:       Eyes: PERRL, lids and conjunctivae normal ENMT: Mucous membranes are moist. Posterior pharynx clear of any exudate or lesions.Normal dentition.  Neck: normal, supple, no masses, no thyromegaly Respiratory: clear to auscultation bilaterally, no wheezing, no crackles. Normal respiratory effort. No accessory muscle use.  Cardiovascular: Regular rate and rhythm, no murmurs / rubs / gallops. No extremity edema. 2+ pedal pulses.  Abdomen: no tenderness, no masses palpated. No hepatosplenomegaly. Bowel sounds positive.  Musculoskeletal: no clubbing / cyanosis. No joint deformity upper and lower extremities. Good ROM, no contractures. Normal muscle tone.  Skin: no rashes, lesions, ulcers. No induration Neurologic: CN 2-12 grossly intact. Sensation intact,Strength 4/5 in all lower extremity Psychiatric: Normal judgment and insight. Alert and oriented x 3. Normal mood.    Labs on Admission: I have personally reviewed following labs and imaging studies  CBC: Recent Labs  Lab 02/29/24 1903  WBC 10.5  HGB 13.6  HCT 41.9  MCV 88.2  PLT 119*   Basic Metabolic Panel: Recent Labs  Lab 02/29/24 1903  NA 140  K 3.6  CL 102  CO2 24  GLUCOSE 156*  BUN 15  CREATININE 1.47*  CALCIUM  9.3   GFR: Estimated Creatinine Clearance: 48.5 mL/min (A) (by C-G formula based on SCr of 1.47 mg/dL (H)). Liver Function Tests: Recent Labs  Lab 02/29/24 1903  AST 17  ALT 13  ALKPHOS 63  BILITOT 1.8*  PROT 7.8  ALBUMIN 3.5   No results for input(s): LIPASE, AMYLASE in the last 168 hours. No results for input(s): AMMONIA in the last 168 hours. Coagulation Profile: Recent Labs  Lab 02/29/24 1903  INR 1.2   Cardiac Enzymes: Recent Labs  Lab 02/29/24 1904  CKTOTAL 241   BNP (last 3 results) No results for input(s): PROBNP in the last 8760 hours. HbA1C: No  results for input(s): HGBA1C in the last 72 hours. CBG: Recent Labs  Lab 02/29/24 1904  GLUCAP 147*  Lipid Profile: No results for input(s): CHOL, HDL, LDLCALC, TRIG, CHOLHDL, LDLDIRECT in the last 72 hours. Thyroid  Function Tests: No results for input(s): TSH, T4TOTAL, FREET4, T3FREE, THYROIDAB in the last 72 hours. Anemia Panel: No results for input(s): VITAMINB12, FOLATE, FERRITIN, TIBC, IRON, RETICCTPCT in the last 72 hours. Urine analysis:    Component Value Date/Time   COLORURINE YELLOW 02/29/2024 2220   APPEARANCEUR CLEAR 02/29/2024 2220   LABSPEC >1.046 (H) 02/29/2024 2220   PHURINE 7.0 02/29/2024 2220   GLUCOSEU NEGATIVE 02/29/2024 2220   HGBUR SMALL (A) 02/29/2024 2220   BILIRUBINUR NEGATIVE 02/29/2024 2220   KETONESUR NEGATIVE 02/29/2024 2220   PROTEINUR 100 (A) 02/29/2024 2220   UROBILINOGEN 1.0 12/10/2014 1103   NITRITE NEGATIVE 02/29/2024 2220   LEUKOCYTESUR NEGATIVE 02/29/2024 2220    Radiological Exams on Admission: CT Head Wo Contrast Result Date: 02/29/2024 CLINICAL DATA:  Altered mental status. Unknown cause. Also with bilateral leg weakness and loss of bowel control. The patient has a known abdominal aortic dissection and aneurysm. Acute aortic syndrome is suspected. EXAM: CT HEAD WITHOUT CONTRAST CT ANGIOGRAPHY CHEST, ABDOMEN AND PELVIS TECHNIQUE: Contiguous axial images were obtained from the base of the skull through the vertex without intravenous contrast. Multiplanar CT image reconstructions were also created and reviewed. Noncontrast axial CT of the chest was initially obtained. Multidetector CT imaging through the chest, abdomen and pelvis was performed using the standard protocol during bolus administration of intravenous contrast. Multiplanar reconstructed images and MIPs were obtained and reviewed to evaluate the vascular anatomy. RADIATION DOSE REDUCTION: This exam was performed according to the departmental  dose-optimization program which includes automated exposure control, adjustment of the mA and/or kV according to patient size and/or use of iterative reconstruction technique. CONTRAST:  OMNIPAQUE  IOHEXOL  350 MG/ML SOLN COMPARISON:  MRI brain from today, head CT from 05/07/2020, CT abdomen and pelvis with contrast 04/13/2021, and CTA chest, abdomen and pelvis 07/19/2020 FINDINGS: CT HEAD WITHOUT CONTRAST FINDINGS Brain: There is moderately developed cerebral atrophy with atrophic ventriculomegaly and moderate small vessel disease. Cerebellum and brainstem are unremarkable. No cortical based acute infarct, hemorrhage, mass or mass effect is seen. There is no midline shift.  Basal cisterns are clear. Vascular: No hyperdense vessels or unexpected calcification. Scattered calcification both siphons. Skull: Negative for fractures or focal lesions. Sinuses/orbits: Interval lens extractions new from 2021. Dysconjugate gaze. Otherwise negative orbits. Clear sinuses and mastoids. [name]: None. CTA CHEST FINDINGS Cardiovascular: Endoluminal aortic stent graft again extends from the junction of the isthmus and descending segment ending in the hiatal segment, again with the distal tines incompletely apposed but unchanged in appearance. There is aortic atherosclerosis. The representative aortic measurements are as follows: Valve: 2.2 cm, previously 3 cm, on 11:92; Sinuses: 4.2 cm on 11:91, previously 4.6 cm; Sinotubular junction: 3.7 cm on 11:93, previously 3.8 cm; Mid ascending segment 4.3 x 4.3 cm on 11:98 and 12:102, previously 4.6 cm; Mid arch: 3.4 cm on 11:87; Isthmus: 3.5 cm on 12:128; Mid descending aorta: 5.0 cm AP and 5.4 cm transverse on 12:143 and 11:46, previously 4.3 cm; distal descending segment: 4.0 cm on 12:131, previously 3.8 cm. There is an increased, but noted previously type 1A endoleak from the posterior aspect of the proximal sealing zone, with at least a small type 2 contribution from an intercostal  artery best seen on 7: 40-42. Contrast is seen in the excluded aneurysm sac along the anteromedial aspect to the level of T7, with increased the size of the descending aorta compared  to the prior study as described above. I do not see a thoracic aortic dissection. There is mild cardiomegaly, but less than in 2021, small pericardial effusion anteriorly. The pulmonary arteries are well opacified. No embolus is seen. There is IVC and hepatic vein reflux which may be seen with right heart dysfunction or tricuspid regurgitation. The pulmonary veins are nondistended. Great vessels are tortuous but clear. Mediastinum/Nodes: Low-density fluid in the superior pericardial recess. No mediastinal hematoma. No adenopathy. Lungs/Pleura: Small layering left pleural effusion. The fluid measures 25 Hounsfield units slightly above simple fluid inferiorly, only measuring 7 Hounsfield units proximally. Pleural space hemorrhage would be expected to be denser and more heterogeneous but the pleural effusion etiology is not readily apparent. The lungs are otherwise clear. Central airways are patent. Musculoskeletal: Stable T8 vertebral body sclerotic lesion probable hemangioma. No acute or significant osseous findings. Review of the MIP images confirms the above findings. 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 There is communication between the true and false lumens at the inferior aspect. The false lumen is posterior to the right. The false lumen is patent but with increased near circumferential thrombus since the prior studies. 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 Celiac: Arises from the true lumen. Scattered calcific plaques. No stenosis, dissection or  aneurysm. SMA: Dissection flap again extends into the SMA up to the first vessel main bifurcation, the true lumen supplies about 40% of the overall vessel diameter with the false lumen up to 60%. The false lumen largely supplies the main right-sided SMA division and the true lumen supplies the remainder. Opacification of the false lumen is significantly less than of the true lumen. The false lumen was better opacified on the prior study than today. Renals: Supplied by the true lumen. Both are widely patent, small amount of negligible calcific plaque at both ostia. IMA: Patent without evidence of aneurysm, dissection, vasculitis or significant stenosis. Inflow: Patent without evidence of aneurysm, dissection, vasculitis or significant stenosis. There is moderate inflow vessel tortuosity which was seen previously, scattered calcific plaques. Veins: No obvious venous abnormality within the limitations of this arterial phase study. Review of the MIP images confirms the above findings. NON-VASCULAR Hepatobiliary: No focal liver abnormality is seen. No gallstones, gallbladder wall thickening, or biliary dilatation. Pancreas: Fatty infiltration. No mass, ductal dilatation or inflammatory change. Spleen: No abnormality. Adrenals/Urinary Tract: No renal mass enhancement.  No adrenal mass. There are a few bilateral too small to characterize Bosniak 2 cortical cysts. No follow-up imaging is recommended. There is contrast in the collecting systems. There previously was a 2 mm nonobstructing stone in the inferior pole of the right kidney. The contrast would obscure intrarenal stones today but there is no hydronephrosis, no ureteral filling defect. The bladder is moderately impressed upon by median lobe hypertrophy of the prostate. But has a normal wall thickness. Stomach/Bowel: Negative for dilatation or wall thickening including the appendix. Uncomplicated sigmoid diverticula. Lymphatic: No adenopathy. Reproductive: Severe  prostatomegaly included the median lobe, maximum diameter 7.3 cm. Moderate impression into the bladder. [name]: Right inguinal hernia contains a short loop of the distal small bowel without evidence of hernia strangulation or incarceration. There is a small left inguinal fat hernia. No free fluid, free hemorrhage or free air. Musculoskeletal: Left-sided  dorsal fusion construct with interbody hardware and mature fusion L4-5. No acute or significant osseous findings. Review of the MIP images confirms the above findings. IMPRESSION: 1. No acute intracranial CT findings. Stable atrophy and small-vessel changes. 2. Interval lens extractions/replacements with dysconjugate gaze. 3. Endoluminal aortic stent graft again extends from the junction of the isthmus and descending segment ending in the hiatal segment, with increased type 1A endoleak from the posterior aspect of the proximal landing zone, and at least a small type 2 contribution from an intercostal artery. Contrast is seen in the excluded aneurysm sac along the anteromedial aspect to the level of T7, and the descending aorta has increased in size from 4.3 cm in 2021 to 5.0 x 5.4 cm today. 4. Abdominal aortic dissection again extends from just below the aortic stent graft into the infrarenal segment terminating above the aortic bifurcation. The false lumen is patent but with increased near circumferential thrombus since the prior studies. 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 Vascular surgery referral recommended unless already done. 5. Dissection flap again extends into the SMA up to the first main bifurcation, the true lumen supplies about 40% of the overall vessel diameter with the false lumen up to 60%. The false lumen largely supplies the main right-sided SMA division and the true lumen supplies the remainder. 6. Small layering left pleural effusion with fluid density slightly above simple fluid inferiorly. Pleural space  hemorrhage would be expected to be denser and more heterogeneous but the pleural effusion etiology is not readily apparent. 7. Mild cardiomegaly, but less than in 2021, with small pericardial effusion anteriorly. 8. IVC and hepatic vein reflux which may be seen with right heart dysfunction or tricuspid regurgitation. 9. Severe prostatomegaly including the median lobe, with moderate impression into the bladder. 10. Right inguinal hernia containing a short loop of the distal small bowel without evidence of hernia strangulation or incarceration. No upstream small bowel obstruction. 11. Small left inguinal fat hernia. 12. Uncomplicated diverticulosis. 13. Aortic atherosclerosis. Aortic Atherosclerosis (ICD10-I70.0). Electronically Signed   By: Francis Quam M.D.   On: 02/29/2024 23:00   CT Angio Chest/Abd/Pel for Dissection W and/or Wo Contrast Result Date: 02/29/2024 CLINICAL DATA:  Altered mental status. Unknown cause. Also with bilateral leg weakness and loss of bowel control. The patient has a known abdominal aortic dissection and aneurysm. Acute aortic syndrome is suspected. EXAM: CT HEAD WITHOUT CONTRAST CT ANGIOGRAPHY CHEST, ABDOMEN AND PELVIS TECHNIQUE: Contiguous axial images were obtained from the base of the skull through the vertex without intravenous contrast. Multiplanar CT image reconstructions were also created and reviewed. Noncontrast axial CT of the chest was initially obtained. Multidetector CT imaging through the chest, abdomen and pelvis was performed using the standard protocol during bolus administration of intravenous contrast. Multiplanar reconstructed images and MIPs were obtained and reviewed to evaluate the vascular anatomy. RADIATION DOSE REDUCTION: This exam was performed according to the departmental dose-optimization program which includes automated exposure control, adjustment of the mA and/or kV according to patient size and/or use of iterative reconstruction technique. CONTRAST:   OMNIPAQUE  IOHEXOL  350 MG/ML SOLN COMPARISON:  MRI brain from today, head CT from 05/07/2020, CT abdomen and pelvis with contrast 04/13/2021, and CTA chest, abdomen and pelvis 07/19/2020 FINDINGS: CT HEAD WITHOUT CONTRAST FINDINGS Brain: There is moderately developed cerebral atrophy with atrophic ventriculomegaly and moderate small vessel disease. Cerebellum and brainstem are unremarkable. No cortical based acute infarct, hemorrhage, mass or mass  effect is seen. There is no midline shift.  Basal cisterns are clear. Vascular: No hyperdense vessels or unexpected calcification. Scattered calcification both siphons. Skull: Negative for fractures or focal lesions. Sinuses/orbits: Interval lens extractions new from 2021. Dysconjugate gaze. Otherwise negative orbits. Clear sinuses and mastoids. [name]: None. CTA CHEST FINDINGS Cardiovascular: Endoluminal aortic stent graft again extends from the junction of the isthmus and descending segment ending in the hiatal segment, again with the distal tines incompletely apposed but unchanged in appearance. There is aortic atherosclerosis. The representative aortic measurements are as follows: Valve: 2.2 cm, previously 3 cm, on 11:92; Sinuses: 4.2 cm on 11:91, previously 4.6 cm; Sinotubular junction: 3.7 cm on 11:93, previously 3.8 cm; Mid ascending segment 4.3 x 4.3 cm on 11:98 and 12:102, previously 4.6 cm; Mid arch: 3.4 cm on 11:87; Isthmus: 3.5 cm on 12:128; Mid descending aorta: 5.0 cm AP and 5.4 cm transverse on 12:143 and 11:46, previously 4.3 cm; distal descending segment: 4.0 cm on 12:131, previously 3.8 cm. There is an increased, but noted previously type 1A endoleak from the posterior aspect of the proximal sealing zone, with at least a small type 2 contribution from an intercostal artery best seen on 7: 40-42. Contrast is seen in the excluded aneurysm sac along the anteromedial aspect to the level of T7, with increased the size of the descending aorta compared to  the prior study as described above. I do not see a thoracic aortic dissection. There is mild cardiomegaly, but less than in 2021, small pericardial effusion anteriorly. The pulmonary arteries are well opacified. No embolus is seen. There is IVC and hepatic vein reflux which may be seen with right heart dysfunction or tricuspid regurgitation. The pulmonary veins are nondistended. Great vessels are tortuous but clear. Mediastinum/Nodes: Low-density fluid in the superior pericardial recess. No mediastinal hematoma. No adenopathy. Lungs/Pleura: Small layering left pleural effusion. The fluid measures 25 Hounsfield units slightly above simple fluid inferiorly, only measuring 7 Hounsfield units proximally. Pleural space hemorrhage would be expected to be denser and more heterogeneous but the pleural effusion etiology is not readily apparent. The lungs are otherwise clear. Central airways are patent. Musculoskeletal: Stable T8 vertebral body sclerotic lesion probable hemangioma. No acute or significant osseous findings. Review of the MIP images confirms the above findings. 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 There is communication between the true and false lumens at the inferior aspect. The false lumen is posterior to the right. The false lumen is patent but with increased near circumferential thrombus since the prior studies. 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 Celiac: Arises from the true lumen. Scattered calcific plaques. No stenosis, dissection or aneurysm. SMA: Dissection flap again extends into the SMA up to the first vessel main bifurcation, the true lumen supplies about 40% of the overall vessel diameter with the false lumen up  to 60%. The false lumen largely supplies the main right-sided SMA division and the true lumen supplies the remainder. Opacification of the false lumen is significantly less than of the true lumen. The false lumen was better opacified on the prior study than today. Renals: Supplied by the true lumen. Both are widely patent, small amount of negligible calcific plaque at both ostia. IMA: Patent without evidence of aneurysm, dissection, vasculitis  or significant stenosis. Inflow: Patent without evidence of aneurysm, dissection, vasculitis or significant stenosis. There is moderate inflow vessel tortuosity which was seen previously, scattered calcific plaques. Veins: No obvious venous abnormality within the limitations of this arterial phase study. Review of the MIP images confirms the above findings. NON-VASCULAR Hepatobiliary: No focal liver abnormality is seen. No gallstones, gallbladder wall thickening, or biliary dilatation. Pancreas: Fatty infiltration. No mass, ductal dilatation or inflammatory change. Spleen: No abnormality. Adrenals/Urinary Tract: No renal mass enhancement.  No adrenal mass. There are a few bilateral too small to characterize Bosniak 2 cortical cysts. No follow-up imaging is recommended. There is contrast in the collecting systems. There previously was a 2 mm nonobstructing stone in the inferior pole of the right kidney. The contrast would obscure intrarenal stones today but there is no hydronephrosis, no ureteral filling defect. The bladder is moderately impressed upon by median lobe hypertrophy of the prostate. But has a normal wall thickness. Stomach/Bowel: Negative for dilatation or wall thickening including the appendix. Uncomplicated sigmoid diverticula. Lymphatic: No adenopathy. Reproductive: Severe prostatomegaly included the median lobe, maximum diameter 7.3 cm. Moderate impression into the bladder. [name]: Right inguinal hernia contains a short loop of the distal small bowel without  evidence of hernia strangulation or incarceration. There is a small left inguinal fat hernia. No free fluid, free hemorrhage or free air. Musculoskeletal: Left-sided dorsal fusion construct with interbody hardware and mature fusion L4-5. No acute or significant osseous findings. Review of the MIP images confirms the above findings. IMPRESSION: 1. No acute intracranial CT findings. Stable atrophy and small-vessel changes. 2. Interval lens extractions/replacements with dysconjugate gaze. 3. Endoluminal aortic stent graft again extends from the junction of the isthmus and descending segment ending in the hiatal segment, with increased type 1A endoleak from the posterior aspect of the proximal landing zone, and at least a small type 2 contribution from an intercostal artery. Contrast is seen in the excluded aneurysm sac along the anteromedial aspect to the level of T7, and the descending aorta has increased in size from 4.3 cm in 2021 to 5.0 x 5.4 cm today. 4. Abdominal aortic dissection again extends from just below the aortic stent graft into the infrarenal segment terminating above the aortic bifurcation. The false lumen is patent but with increased near circumferential thrombus since the prior studies. 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 Vascular surgery referral recommended unless already done. 5. Dissection flap again extends into the SMA up to the first main bifurcation, the true lumen supplies about 40% of the overall vessel diameter with the false lumen up to 60%. The false lumen largely supplies the main right-sided SMA division and the true lumen supplies the remainder. 6. Small layering left pleural effusion with fluid density slightly above simple fluid inferiorly. Pleural space hemorrhage would be expected to be denser and more heterogeneous but the pleural effusion etiology is not readily apparent. 7. Mild cardiomegaly, but less than in 2021, with small  pericardial effusion anteriorly. 8. IVC and hepatic vein reflux which may be seen with right heart dysfunction or tricuspid regurgitation. 9. Severe prostatomegaly including the median lobe, with moderate impression into the bladder. 10. Right inguinal hernia containing a short loop of the distal small bowel without evidence of hernia strangulation or incarceration. No upstream small bowel obstruction. 11. Small left inguinal fat hernia. 12. Uncomplicated diverticulosis. 13. Aortic atherosclerosis. Aortic Atherosclerosis (ICD10-I70.0). Electronically Signed   By: Francis Beatriz HERO.D.  On: 02/29/2024 23:00   MR ANGIO HEAD WO CONTRAST Result Date: 02/29/2024 CLINICAL DATA:  Neuro deficit, acute, stroke suspected EXAM: MRI HEAD WITHOUT CONTRAST MRA HEAD WITHOUT CONTRAST TECHNIQUE: Multiplanar, multiecho pulse sequences of the brain and surrounding structures were obtained without intravenous contrast. Angiographic images of the Circle of Willis were obtained using MRA technique without intravenous contrast. COMPARISON:  None Available. FINDINGS: MRI HEAD FINDINGS Brain: No acute infarction, hemorrhage, hydrocephalus, extra-axial collection or mass lesion. 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 Mild for age scattered T2/FLAIR hyperintensity white matter, compatible with chronic microvascular ischemic disease. Cerebral atrophy. Vascular: See below. Skull and upper cervical spine: Normal marrow signal. Sinuses/Orbits: Clear sinuses.  No acute orbital findings. [name]: No mastoid effusions. MRA HEAD FINDINGS Anterior circulation: Bilateral intracranial ICAs, MCAs, and ACAs are patent without proximal hemodynamically significant stenosis. No aneurysm identified. Posterior circulation: Bilateral intradural vertebral arteries, basilar artery, and bilateral posterior arteries are patent without proximal hemodynamically significant stenosis. No aneurysm identified. IMPRESSION: 1. No evidence of  acute intracranial abnormality. 2. No large vessel occlusion or proximal hemodynamically significant stenosis. Electronically Signed   By: Gilmore GORMAN Molt M.D.   On: 02/29/2024 21:50   MR BRAIN WO CONTRAST Result Date: 02/29/2024 CLINICAL DATA:  Neuro deficit, acute, stroke suspected EXAM: MRI HEAD WITHOUT CONTRAST MRA HEAD WITHOUT CONTRAST TECHNIQUE: Multiplanar, multiecho pulse sequences of the brain and surrounding structures were obtained without intravenous contrast. Angiographic images of the Circle of Willis were obtained using MRA technique without intravenous contrast. COMPARISON:  None Available. FINDINGS: MRI HEAD FINDINGS Brain: No acute infarction, hemorrhage, hydrocephalus, extra-axial collection or mass lesion. 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 Mild for age scattered T2/FLAIR hyperintensity white matter, compatible with chronic microvascular ischemic disease. Cerebral atrophy. Vascular: See below. Skull and upper cervical spine: Normal marrow signal. Sinuses/Orbits: Clear sinuses.  No acute orbital findings. [name]: No mastoid effusions. MRA HEAD FINDINGS Anterior circulation: Bilateral intracranial ICAs, MCAs, and ACAs are patent without proximal hemodynamically significant stenosis. No aneurysm identified. Posterior circulation: Bilateral intradural vertebral arteries, basilar artery, and bilateral posterior arteries are patent without proximal hemodynamically significant stenosis. No aneurysm identified. IMPRESSION: 1. No evidence of acute intracranial abnormality. 2. No large vessel occlusion or proximal hemodynamically significant stenosis. Electronically Signed   By: Gilmore GORMAN Molt M.D.   On: 02/29/2024 21:50    EKG: Independently reviewed.  Assessment/Plan  Acute encephalopathy -MRI negative  cva ruled out -? Related uncontrolled hypertension - will order ammonia, uds, vbg , tsh ,EEG to be complete   Hypertension uncontrolled  - resume home  regimen  - prn labetalol    AKI - hold nephrotoxic medications  - gentle ivfs   Global weakness in setting of chronic debility - uses walker at baseline  - due to acute medical illness nos ? Viral prodrome/AKI -total CK neg  - will hold statin for now  -RVP ordered / f/u inflammatory markers - PT/OT  Abn CE  -due to demand  in setting of uncontrolled blood pressure -noted no significant delta  -will continue to monitor ce over night to be complete, echo ordered for am   AAA with Endo Leak - ct noted enlarging AAA - vascular consulted and noted no acute intervention currently  But is a candidate for possible intervention regarding enlarging AAA However patient will need to be optimized prior to this elective procedure.  GERD -ppi    Chronic back pain  -no acute issue  - supportive care   GERD -ppi  HLD -holding statin for now    DVT prophylaxis:  scd Code Status: full/ as discussed per patient wishes in event of cardiac arrest  Family Communication:    Welle,[name] (Spouse) [phone] (Mobile)   Disposition Plan:patient  expected to be admitted greater than 2 midnights  Consults called: Vascular dr Arron Admission status: med tele   Camila DELENA Ned MD [facility]   If 7PM-7AM, please contact night-coverage [email or web address] Password [email or web address]  03/01/2024, 5:52 AM

## 2024-03-01 NOTE — Progress Notes (Signed)
 Care started prior to midnight in the emergency room and patient was admitted early morning after midnight by Dr. Camila Ned and I am in current agreement with her assessment and plan.  Additional changes of the plan of care been made accordingly.  The patient is a 78 year old African-American male with a past medical history significant for but not limited to chronic back pain, GERD, hypertension, hyperlipidemia, history of AAA status post repair with endoleak, dementia and other comorbidities who presented to the ED with worsening confusion has been progressive last 24 hours associated with generalized weakness that happened hours prior to admission.  Per his wife he was unable to get up from the commode due to his significant weakness and had some difficulty following commands and was noted to be very confused so EMS was called.  His mentation is improving however he continues to only be oriented to himself does not know the year or the month.  Wife states that is not baseline so he is admitted for further evaluation and was found to be globally weak.  Labs were done and showed that he had a mild AKI.SABRA  Given his findings on the CT of the abdomen with his AAA measuring 5.7 x 4.4 cm vascular surgery was consulted for further evaluation and since this showed progression in the infrarenal aorta and given that he is an asymptomatic diabetic.  They are recommending outpatient treatment for aneurysmal degeneration.  He is being admitted and treated for the following but not limited to:  Acute Encephalopathy: MRI negative and cva ruled out.  -? Related uncontrolled hypertension. Neuro workup initiated and ordered ammonia, uds, vbg , tsh ,EEG to be complete. EEG was suggestive of mild diffuse encephalopathy with no seizures or epileptiform discharges seen throughout the recording.   Essential Hypertension uncontrolled: resume home regimen of Carvedilol  25 mg po BID, Amlodipine  2.5 mg po Daily, and  Hydralazine  10 mg q6h. Continue to monitor blood pressures per protocol.  Continue with prn labetalol .    AKI: BUN/Cr Trend: Recent Labs  Lab 02/29/24 1903 03/01/24 0620  BUN 15 13  CREATININE 1.47* 1.43*  -IVF with NS @ 75 mL/hr -Avoid Nephrotoxic Medications, Contrast Dyes, Hypotension and Dehydration to Ensure Adequate Renal Perfusion and will need to Renally Adjust Meds -Continue to Monitor and Trend Renal Function carefully and repeat CMP in the AM   Hypokalemia: Mild @ 3.3. Replete. CTM and repelte as Necessary.    Global weakness in setting of chronic debility:  Uses walker at baseline  -Due to acute medical illness nos ? Viral prodrome/AKI. Total CK neg  - will hold statin for now. RVP ordered / f/u inflammatory markers elevated as CRP is 22.0 and ESR is 65 - PT/OT    Abn CE : due to demand  in setting of uncontrolled blood pressure. Noted no significant delta. Continued to monitor ce over night to be complete, echo ordered and pending   AAA with Endo Leak/Aortic Dissection: CT noted enlarging AAA. Vascular consulted and noted no acute intervention currently But is a candidate for possible intervention regarding enlarging AAA. However patient will need to be optimized prior to this elective procedure.   GERD/GI Prophylaxis: C/w PPI Pantoprazole     Chronic Back Pain: No acute issue.C/w supportive care    HLD: Holding statin for now  Overweight: Complicates overall prognosis and care. Estimated body mass index is 29.99 kg/m as calculated from the following:   Height as of this encounter: 5' 10.98 (1.803 m).  Weight as of this encounter: 97.5 kg. Weight Loss and Dietary Counseling given  We will continue to monitor the patient's clinical response to intervention and repeat blood work in the a.m. and follow-up on the test that been ordered.

## 2024-03-01 NOTE — ED Notes (Signed)
Assuming care of this pt at this time

## 2024-03-01 NOTE — ED Notes (Addendum)
 When trying to get pt up and out the bed to walk pt needed a lot of help and was still unable to get up to stand.   Unable to walk pt

## 2024-03-01 NOTE — Plan of Care (Signed)

## 2024-03-02 ENCOUNTER — Inpatient Hospital Stay (HOSPITAL_COMMUNITY)

## 2024-03-02 DIAGNOSIS — I351 Nonrheumatic aortic (valve) insufficiency: Secondary | ICD-10-CM | POA: Diagnosis not present

## 2024-03-02 DIAGNOSIS — R531 Weakness: Secondary | ICD-10-CM

## 2024-03-02 DIAGNOSIS — R7989 Other specified abnormal findings of blood chemistry: Secondary | ICD-10-CM

## 2024-03-02 DIAGNOSIS — Z95828 Presence of other vascular implants and grafts: Secondary | ICD-10-CM

## 2024-03-02 DIAGNOSIS — G9340 Encephalopathy, unspecified: Secondary | ICD-10-CM | POA: Diagnosis not present

## 2024-03-02 LAB — CBC WITH DIFFERENTIAL/PLATELET
Abs Immature Granulocytes: 0.03 K/uL (ref 0.00–0.07)
Basophils Absolute: 0 K/uL (ref 0.0–0.1)
Basophils Relative: 0 %
Eosinophils Absolute: 0.2 K/uL (ref 0.0–0.5)
Eosinophils Relative: 3 %
HCT: 35.7 % — ABNORMAL LOW (ref 39.0–52.0)
Hemoglobin: 11.6 g/dL — ABNORMAL LOW (ref 13.0–17.0)
Immature Granulocytes: 0 %
Lymphocytes Relative: 14 %
Lymphs Abs: 1 K/uL (ref 0.7–4.0)
MCH: 28.6 pg (ref 26.0–34.0)
MCHC: 32.5 g/dL (ref 30.0–36.0)
MCV: 88.1 fL (ref 80.0–100.0)
Monocytes Absolute: 0.8 K/uL (ref 0.1–1.0)
Monocytes Relative: 12 %
Neutro Abs: 4.8 K/uL (ref 1.7–7.7)
Neutrophils Relative %: 71 %
Platelets: 141 K/uL — ABNORMAL LOW (ref 150–400)
RBC: 4.05 MIL/uL — ABNORMAL LOW (ref 4.22–5.81)
RDW: 14.9 % (ref 11.5–15.5)
WBC: 6.8 K/uL (ref 4.0–10.5)
nRBC: 0 % (ref 0.0–0.2)

## 2024-03-02 LAB — ECHOCARDIOGRAM COMPLETE
AR max vel: 3.22 cm2
AV Area VTI: 3.21 cm2
AV Area mean vel: 3.21 cm2
AV Mean grad: 4.5 mmHg
AV Peak grad: 8.4 mmHg
Ao pk vel: 1.45 m/s
Area-P 1/2: 3.27 cm2
Height: 70.984 in
S' Lateral: 3.2 cm
Weight: 3439.18 [oz_av]

## 2024-03-02 LAB — COMPREHENSIVE METABOLIC PANEL WITH GFR
ALT: 14 U/L (ref 0–44)
AST: 18 U/L (ref 15–41)
Albumin: 2.7 g/dL — ABNORMAL LOW (ref 3.5–5.0)
Alkaline Phosphatase: 56 U/L (ref 38–126)
Anion gap: 11 (ref 5–15)
BUN: 10 mg/dL (ref 8–23)
CO2: 25 mmol/L (ref 22–32)
Calcium: 8.8 mg/dL — ABNORMAL LOW (ref 8.9–10.3)
Chloride: 104 mmol/L (ref 98–111)
Creatinine, Ser: 1.34 mg/dL — ABNORMAL HIGH (ref 0.61–1.24)
GFR, Estimated: 54 mL/min — ABNORMAL LOW (ref >60)
Glucose, Bld: 126 mg/dL — ABNORMAL HIGH (ref 70–99)
Potassium: 3.8 mmol/L (ref 3.5–5.1)
Sodium: 140 mmol/L (ref 135–145)
Total Bilirubin: 1.3 mg/dL — ABNORMAL HIGH (ref 0.0–1.2)
Total Protein: 6.4 g/dL — ABNORMAL LOW (ref 6.5–8.1)

## 2024-03-02 LAB — MAGNESIUM: Magnesium: 2.5 mg/dL — ABNORMAL HIGH (ref 1.7–2.4)

## 2024-03-02 LAB — HEMOGLOBIN A1C
Hgb A1c MFr Bld: 6.9 % — ABNORMAL HIGH (ref 4.8–5.6)
Mean Plasma Glucose: 151 mg/dL

## 2024-03-02 LAB — PHOSPHORUS: Phosphorus: 3.1 mg/dL (ref 2.5–4.6)

## 2024-03-02 MED ORDER — SODIUM CHLORIDE 0.9 % IV SOLN: INTRAVENOUS | Status: DC

## 2024-03-02 NOTE — Plan of Care (Signed)

## 2024-03-02 NOTE — Evaluation (Signed)
 Physical Therapy Evaluation Patient Details Name: [name] [name] MRN: [id number] DOB: [birth year] Today's Date: 03/02/2024  History of Present Illness  78 y.o. male with medical history significant of   Chronic back pain , GERD, HLD, HTN , AAA s/p repair with endo leak,Dementia, who presents to ED with with confusion x 3 that has been progressive over the last  24 hours and now also associated with weakness x hours PTA. Per wife patient was unable to get up from commode due to weakness. He was also having difficulty following commands and was noted to be very confused due to this EMS was called.  Clinical Impression   Pt admitted with above diagnosis. Lives at home with his wife, in a single-level home with 12 (6, landing, then 6 more) steps to enter; Prior to admission, pt was able to ambulate independently with use of RW; Presents to PT with generalized weakness, uncoordinated steps, incr fall risk;  Pt moves slowly, and needed mod assist to get up to EOB, stand from a lower surface, and mod assist to take pivotal steps to the recliner with RW; I'm hopeful for good progress with functional mobility as he improves medically; In considering options for discharge, I value going back to familiar environment, caregivers, and routines for patients with dementia, so I agree with OT that dc home is likely the most therapeutic option for Mr. Dec; We still must consider all thise steps to get into his house;  if slower than anticipated progress, we must consider SNF; Will plan to see pt on the early side tomorrow to help iscern; Pt currently with functional limitations due to the deficits listed below (see PT Problem List). Pt will benefit from skilled PT to increase their independence and safety with mobility to allow discharge to the venue listed below.       Have discussed pt functional status with TOC and Dr. Sherrill via secure chat      If plan is discharge home, recommend the following: A lot of help  with walking and/or transfers;A lot of help with bathing/dressing/bathroom;Assist for transportation;Help with stairs or ramp for entrance   Can travel by private vehicle        Equipment Recommendations BSC/3in1  Recommendations for Other Services       Functional Status Assessment Patient has had a recent decline in their functional status and demonstrates the ability to make significant improvements in function in a reasonable and predictable amount of time.     Precautions / Restrictions        Mobility  Bed Mobility Overal bed mobility: Needs Assistance Bed Mobility: Supine to Sit     Supine to sit: Mod assist, HOB elevated, Used rails     General bed mobility comments: Increased time to complete bed mobility transition with max VC for technique and sequencing.    Transfers Overall transfer level: Needs assistance Equipment used: Rolling walker (2 wheels) Transfers: Sit to/from Stand, Bed to chair/wheelchair/BSC Sit to Stand: Min assist, From elevated surface   Step pivot transfers: Min assist, Mod assist       General transfer comment: Pt educated on proper hand placement with RW management prior to sit<>stand transition. Max VC to lean trunk forward, push feet into floor, squeeze glutes, and push in RW handles with BUE to bring trunk upright to stand. Took slow pivot steps to the recliner; one loss of balance needing Mod assist to correct    Ambulation/Gait  Stairs            Wheelchair Mobility     Tilt Bed    Modified Rankin (Stroke Patients Only)       Balance     Sitting balance-Leahy Scale: Fair       Standing balance-Leahy Scale: Poor                               Pertinent Vitals/Pain Pain Assessment Pain Assessment: No/denies pain    Home Living Family/patient expects to be discharged to:: Private residence Living Arrangements: Spouse/significant other Available Help at Discharge:  Family Type of Home: House Home Access: Stairs to enter Entrance Stairs-Rails: Right;Left;Can reach both Entrance Stairs-Number of Steps: 6 then a landing then 6 more   Home Layout: Two level;Able to live on main level with bedroom/bathroom;Bed/bath upstairs Home Equipment: Tub bench;Rolling Walker (2 wheels);Rollator (4 wheels);Hand held shower head;BSC/3in1;Grab bars - toilet;Wheelchair - manual Additional Comments: Enjoys watching TV and walking outside on his driveway    Prior Function Prior Level of Function : Independent/Modified Independent             Mobility Comments: Normally using RW for ambulation       Extremity/Trunk Assessment   Upper Extremity Assessment Upper Extremity Assessment: Defer to OT evaluation    Lower Extremity Assessment Lower Extremity Assessment: Generalized weakness    Cervical / Trunk Assessment Cervical / Trunk Assessment: Kyphotic  Communication   Communication Communication: No apparent difficulties    Cognition Arousal: Alert Behavior During Therapy: WFL for tasks assessed/performed                             Following commands: Intact       Cueing Cueing Techniques: Verbal cues     General Comments General comments (skin integrity, edema, etc.): NAD on room air; family friends arrived as pt got into recliner    Exercises     Assessment/Plan    PT Assessment Patient needs continued PT services  PT Problem List Decreased strength;Decreased activity tolerance;Decreased balance;Decreased mobility;Decreased coordination;Decreased cognition;Decreased knowledge of use of DME;Decreased safety awareness;Decreased knowledge of precautions       PT Treatment Interventions DME instruction;Gait training;Stair training;Functional mobility training;Therapeutic activities;Therapeutic exercise;Balance training;Neuromuscular re-education;Cognitive remediation;Patient/family education    PT Goals (Current goals can be  found in the Care Plan section)  Acute Rehab PT Goals Patient Stated Goal: Agrees to getting OOB PT Goal Formulation: With patient Time For Goal Achievement: 03/16/24 Potential to Achieve Goals: Good    Frequency Min 3X/week     Co-evaluation               AM-PAC PT 6 Clicks Mobility  Outcome Measure Help needed turning from your back to your side while in a flat bed without using bedrails?: A Little Help needed moving from lying on your back to sitting on the side of a flat bed without using bedrails?: A Lot Help needed moving to and from a bed to a chair (including a wheelchair)?: A Lot Help needed standing up from a chair using your arms (e.g., wheelchair or bedside chair)?: A Lot Help needed to walk in hospital room?: A Lot Help needed climbing 3-5 steps with a railing? : A Lot 6 Click Score: 13    End of Session Equipment Utilized During Treatment: Gait belt Activity Tolerance: Patient tolerated treatment well Patient left: in  chair;with call bell/phone within reach;with chair alarm set;with family/visitor present Nurse Communication: Mobility status PT Visit Diagnosis: Unsteadiness on feet (R26.81);Muscle weakness (generalized) (M62.81)    Time: 1351-1410 PT Time Calculation (min) (ACUTE ONLY): 19 min   Charges:   PT Evaluation $PT Eval Moderate Complexity: 1 Mod   PT General Charges $$ ACUTE PT VISIT: 1 Visit         Silvano Currier, PT  Acute Rehabilitation Services Office [phone] Secure Chat welcomed   Silvano VEAR Currier 03/02/2024, 3:51 PM

## 2024-03-02 NOTE — Progress Notes (Signed)
*  PRELIMINARY RESULTS* Echocardiogram 2D Echocardiogram has been performed.  [name] [name] Stallion 03/02/2024, 9:43 AM

## 2024-03-02 NOTE — Progress Notes (Addendum)
  Progress Note    03/02/2024 7:06 AM Hospital Day 2  Subjective:  says he feels better.  Denies any new back or abdominal pain  Afebrile   Vitals:   03/01/24 2044 03/02/24 0513  BP: (!) 165/91 (!) 166/95  Pulse: 64 67  Resp: 18 18  Temp: 98 F (36.7 C) 98.3 F (36.8 C)  SpO2: 99% 97%    Physical Exam: General:  no distress Lungs:  non labored Extremities:  palpable right PT and left DP and bilateral radial pulses.  Abdomen:  soft, NT  CBC    Component Value Date/Time   WBC 9.6 03/01/2024 0620   RBC 4.11 (L) 03/01/2024 0620   HGB 12.9 (L) 03/01/2024 0630   HGB 12.7 (L) 11/14/2020 1031   HCT 38.0 (L) 03/01/2024 0630   HCT 39.1 11/14/2020 1031   PLT 135 (L) 03/01/2024 0620   PLT 157 11/14/2020 1031   MCV 90.0 03/01/2024 0620   MCV 90 11/14/2020 1031   MCH 28.7 03/01/2024 0620   MCHC 31.9 03/01/2024 0620   RDW 15.0 03/01/2024 0620   RDW 15.6 (H) 11/14/2020 1031   LYMPHSABS 1.0 11/14/2020 1031   MONOABS 1.2 (H) 05/07/2020 1531   EOSABS 0.3 11/14/2020 1031   BASOSABS 0.1 11/14/2020 1031    BMET    Component Value Date/Time   NA 141 03/01/2024 0630   NA 145 (H) 11/14/2020 1031   K 3.3 (L) 03/01/2024 0630   CL 106 03/01/2024 0620   CO2 22 03/01/2024 0620   GLUCOSE 154 (H) 03/01/2024 0620   BUN 13 03/01/2024 0620   BUN 12 11/14/2020 1031   CREATININE 1.43 (H) 03/01/2024 0620   CALCIUM  9.0 03/01/2024 0620   GFRNONAA 50 (L) 03/01/2024 0620   GFRAA >60 05/09/2020 1228    INR    Component Value Date/Time   INR 1.2 02/29/2024 1903     Intake/Output Summary (Last 24 hours) at 03/02/2024 0706 Last data filed at 03/02/2024 0500 Gross per 24 hour  Intake 2011.29 ml  Output 1010 ml  Net 1001.29 ml     Assessment/Plan:  78 y.o. male with hx of thoracic endograft with proximal and distal 37 x 20 cm Gore conformable thoracic stent graft on 10/15/2019 by Dr. 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  He is currently not having any new abdominal or back pain and he has palpable distal pulses.  Given he is asymptomatic, most likely for elective repair as outpatient. -hgb stable at 12.9 yesterday, which is up from previous day of 11.8. -Dr. Sheree to see pt later today and give further recommendations.    Lucie Apt, PA-C [facility] [phone] 03/02/2024 7:06 AM   I have independently interviewed and examined patient and agree with PA assessment and plan above.  Personally reviewed.  CT scan and discussed results with him today.  His aneurysm has certainly enlarged and meets criteria for repair but does not appear to be contributing to his current symptoms which thankfully are improving.  I will have him follow-up with me in 4 to 6 weeks to discuss endovascular repair possibly requiring Tambe versus modified Cook graft.  Asheley Hellberg C. Sheree, MD [facility] Office: [phone] Pager: [phone]

## 2024-03-02 NOTE — Progress Notes (Signed)
 PROGRESS NOTE    [name] [name]  FMW:[id number] DOB: 07-03-1946 DOA: 02/29/2024 PCP: [name], MD   Brief Narrative:  The patient is a 78 year old African-American male with a past medical history significant for but not limited to chronic back pain, GERD, hypertension, hyperlipidemia, history of AAA status post repair with endoleak, dementia and other comorbidities who presented to the ED with worsening confusion has been progressive last 24 hours associated with generalized weakness that happened hours prior to admission.  Per his wife he was unable to get up from the commode due to his significant weakness and had some difficulty following commands and was noted to be very confused so EMS was called.  His mentation is improving however he continues to only be oriented to himself does not know the year or the month.  Wife states that is not baseline so he is admitted for further evaluation and was found to be globally weak.  Labs were done and showed that he had a mild AKI.SABRA  Given his findings on the CT of the abdomen with his AAA measuring 5.7 x 4.4 cm vascular surgery was consulted for further evaluation and since this showed progression in the infrarenal aorta and given that he is an asymptomatic diabetic.  They are recommending outpatient treatment for aneurysmal degeneration.   Assessment and Plan:  Acute Encephalopathy: Improved. MRI negative and cva ruled out.  -? Related uncontrolled hypertension. Neuro workup initiated and ordered Ammonia was 28, UDS Negative, vbg , TSH was 1.065. EEG was suggestive of mild diffuse encephalopathy with no seizures or epileptiform discharges seen throughout the recording.  Global weakness in setting of chronic debility:  Uses walker at baseline  -Due to acute medical illness nos ? Viral prodrome/AKI. Total CK neg  -Held statin for now. RVP Negative. F/u inflammatory markers elevated as CRP is 22.0 and ESR is 65 - PT/OT evaluating and currently  recommending to improve progress and will monitor overnight and re-evaluate functional status. If it is slower than expected they are recommending SNF but     Essential Hypertension uncontrolled: resume home regimen of Carvedilol  25 mg po BID, Amlodipine  2.5 mg po Daily, and Hydralazine  10 mg q6h. Continue to monitor blood pressures per protocol.  Continue with prn labetalol . Last BP reading was 162/90   AKI: BUN/Cr Trend: Recent Labs  Lab 02/29/24 1903 03/01/24 0620 03/02/24 0801  BUN 15 13 10   CREATININE 1.47* 1.43* 1.34*  -IVF with NS @ 75 mL/hr -Avoid Nephrotoxic Medications, Contrast Dyes, Hypotension and Dehydration to Ensure Adequate Renal Perfusion and will need to Renally Adjust Meds -Continue to Monitor and Trend Renal Function carefully and repeat CMP in the AM    Hypokalemia: Mild and Improved. 3.3 -> 3.8. Replete. CTM and repelte as Necessary.     Abn CE : due to demand  in setting of uncontrolled blood pressure. Noted no significant delta. Continued to monitor ce over night to be complete, echo ordered and pending   AAA with Endo Leak/Aortic Dissection: CT noted enlarging AAA. Vascular consulted and noted no acute intervention currently But is a candidate for intervention regarding enlarging AAA. Vascular Recommending F/U in the outpt setting 4-6 weeks to discuss Endovascular Repair possibly requiring Tambe versus modified Cook graft   GERD/GI Prophylaxis: C/w PPI Pantoprazole     Chronic Back Pain: No acute issue.C/w supportive care    HLD: Holding statin for now  Thrombocytopenia: Plt Count went from 135 -> 141. CTM and Trend and repaet CMP in the  AM  Hypoalbuminemia: Patient's Albumin Lvl went from 3.5 -> 2.7. CTM and Trend and Repeat CMP in the AM   Overweight: Complicates overall prognosis and care. Estimated body mass index is 29.99 kg/m as calculated from the following:   Height as of this encounter: 5' 10.98 (1.803 m).   Weight as of this encounter: 97.5 kg.  Weight Loss and Dietary Counseling given   DVT prophylaxis: heparin  injection 5,000 Units Start: 03/01/24 0600    Code Status: Full Code Family Communication: D/w with wife @ bedside   Disposition Plan:  Level of care: Telemetry Medical Status is: Inpatient Remains inpatient appropriate because: Needs further work with PT and anticipate D/Cing home in the next 24 hours if improved in the AM and if not will go to SNF   Consultants:  Vascular Surgery   Procedures:  As delineated as above  Antimicrobials:  Anti-infectives (From admission, onward)    None       Subjective: Getting closer to his baseline.  She thinks that he is doing much better compared to when he first came in.  Still weak and PT also work with him another day.  No nausea or vomiting.  Tolerating his meals without issues.  Objective: Vitals:   03/02/24 0513 03/02/24 0825 03/02/24 1535 03/02/24 1950  BP: (!) 166/95 (!) 143/75 (!) 147/73 (!) 162/90  Pulse: 67 65 68 (!) 58  Resp: 18 (!) 22 16 18   Temp: 98.3 F (36.8 C) 97.8 F (36.6 C) 98.3 F (36.8 C) 100 F (37.8 C)  TempSrc:    Oral  SpO2: 97% 99% 96% 99%  Weight:      Height:        Intake/Output Summary (Last 24 hours) at 03/02/2024 2320 Last data filed at 03/02/2024 1746 Gross per 24 hour  Intake 852.12 ml  Output 200 ml  Net 652.12 ml   Filed Weights   02/29/24 1754 03/01/24 1144  Weight: 97.5 kg 97.5 kg   Examination: Physical Exam:  Constitutional: Overweight chronically ill-appearing African-American male in no acute distress Respiratory: Diminished to auscultation bilaterally, no wheezing, rales, rhonchi or crackles. Normal respiratory effort and patient is not tachypenic. No accessory muscle use.  Unlabored breathing Cardiovascular: RRR, no murmurs / rubs / gallops. S1 and S2 auscultated. No extremity edema. Abdomen: Soft, non-tender, distended secondary to habitus bowel sounds positive.  GU: Deferred. Musculoskeletal: No clubbing /  cyanosis of digits/nails. No joint deformity upper and lower extremities. Skin: No rashes, lesions, ulcers on limited skin evaluation. No induration; Warm and dry.  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 Psychiatric: Normal judgment and insight.  He is awake and alert and oriented only x 2 and does not know the year.  Data Reviewed: I have personally reviewed following labs and imaging studies  CBC: Recent Labs  Lab 02/29/24 1903 03/01/24 0620 03/01/24 0630 03/02/24 0801  WBC 10.5 9.6  --  6.8  NEUTROABS  --   --   --  4.8  HGB 13.6 11.8* 12.9* 11.6*  HCT 41.9 37.0* 38.0* 35.7*  MCV 88.2 90.0  --  88.1  PLT 119* 135*  --  141*   Basic Metabolic Panel: Recent Labs  Lab 02/29/24 1903 03/01/24 0620 03/01/24 0630 03/02/24 0801  NA 140 141 141 140  K 3.6 3.3* 3.3* 3.8  CL 102 106  --  104  CO2 24 22  --  25  GLUCOSE 156* 154*  --  126*  BUN 15 13  --  10  CREATININE 1.47* 1.43*  --  1.34*  CALCIUM  9.3 9.0  --  8.8*  MG  --   --   --  2.5*  PHOS  --   --   --  3.1   GFR: Estimated Creatinine Clearance: 53.2 mL/min (A) (by C-G formula based on SCr of 1.34 mg/dL (H)). Liver Function Tests: Recent Labs  Lab 02/29/24 1903 03/02/24 0801  AST 17 18  ALT 13 14  ALKPHOS 63 56  BILITOT 1.8* 1.3*  PROT 7.8 6.4*  ALBUMIN 3.5 2.7*   No results for input(s): LIPASE, AMYLASE in the last 168 hours. Recent Labs  Lab 03/01/24 0620  AMMONIA 28   Coagulation Profile: Recent Labs  Lab 02/29/24 1903  INR 1.2   Cardiac Enzymes: Recent Labs  Lab 02/29/24 1904  CKTOTAL 241   BNP (last 3 results) No results for input(s): PROBNP in the last 8760 hours. HbA1C: Recent Labs    03/01/24 0620  HGBA1C 6.9*   CBG: Recent Labs  Lab 02/29/24 1904 03/01/24 1245  GLUCAP 147* 170*   Lipid Profile: No results for input(s): CHOL, HDL, LDLCALC, TRIG, CHOLHDL, LDLDIRECT in the last 72 hours. Thyroid  Function  Tests: Recent Labs    03/01/24 0900  TSH 1.065   Anemia Panel: No results for input(s): VITAMINB12, FOLATE, FERRITIN, TIBC, IRON, RETICCTPCT in the last 72 hours. Sepsis Labs: Recent Labs  Lab 03/01/24 0620 03/01/24 0848  PROCALCITON <0.10  --   LATICACIDVEN 1.3 1.3   Recent Results (from the past 240 hours)  Respiratory (~20 pathogens) panel by PCR     Status: None   Collection Time: 03/01/24  5:52 AM   Specimen: Nasopharyngeal Swab; Respiratory  Result Value Ref Range Status   Adenovirus NOT DETECTED NOT DETECTED Final   Coronavirus 229E NOT DETECTED NOT DETECTED Final    Comment: (NOTE) The Coronavirus on the Respiratory Panel, DOES NOT test for the novel  Coronavirus (2019 nCoV)    Coronavirus HKU1 NOT DETECTED NOT DETECTED Final   Coronavirus NL63 NOT DETECTED NOT DETECTED Final   Coronavirus OC43 NOT DETECTED NOT DETECTED Final   Metapneumovirus NOT DETECTED NOT DETECTED Final   Rhinovirus / Enterovirus NOT DETECTED NOT DETECTED Final   Influenza A NOT DETECTED NOT DETECTED Final   Influenza B NOT DETECTED NOT DETECTED Final   Parainfluenza Virus 1 NOT DETECTED NOT DETECTED Final   Parainfluenza Virus 2 NOT DETECTED NOT DETECTED Final   Parainfluenza Virus 3 NOT DETECTED NOT DETECTED Final   Parainfluenza Virus 4 NOT DETECTED NOT DETECTED Final   Respiratory Syncytial Virus NOT DETECTED NOT DETECTED Final   Bordetella pertussis NOT DETECTED NOT DETECTED Final   Bordetella Parapertussis NOT DETECTED NOT DETECTED Final   Chlamydophila pneumoniae NOT DETECTED NOT DETECTED Final   Mycoplasma pneumoniae NOT DETECTED NOT DETECTED Final    Comment: Performed at [facility], [address], [place], [place] [address]  Resp panel by RT-PCR (RSV, Flu A&B, Covid) Anterior Nasal Swab     Status: None   Collection Time: 03/01/24  8:07 PM   Specimen: Anterior Nasal Swab  Result Value Ref Range Status   SARS Coronavirus 2 by RT PCR NEGATIVE NEGATIVE Final    Influenza A by PCR NEGATIVE NEGATIVE Final   Influenza B by PCR NEGATIVE NEGATIVE Final    Comment: (NOTE) The Xpert Xpress SARS-CoV-2/FLU/RSV plus assay is intended as an aid in the diagnosis of influenza from  Nasopharyngeal swab specimens and should not be used as a sole basis for treatment. Nasal washings and aspirates are unacceptable for Xpert Xpress SARS-CoV-2/FLU/RSV testing.  Fact Sheet for Patients: BloggerCourse.com  Fact Sheet for Healthcare Providers: SeriousBroker.it  This test is not yet approved or cleared by the [place]  FDA and has been authorized for detection and/or diagnosis of SARS-CoV-2 by FDA under an Emergency Use Authorization (EUA). This EUA will remain in effect (meaning this test can be used) for the duration of the COVID-19 declaration under Section 564(b)(1) of the Act, 21 U.S.C. section 360bbb-3(b)(1), unless the authorization is terminated or revoked.     Resp Syncytial Virus by PCR NEGATIVE NEGATIVE Final    Comment: (NOTE) Fact Sheet for Patients: BloggerCourse.com  Fact Sheet for Healthcare Providers: SeriousBroker.it  This test is not yet approved or cleared by the [place]  FDA and has been authorized for detection and/or diagnosis of SARS-CoV-2 by FDA under an Emergency Use Authorization (EUA). This EUA will remain in effect (meaning this test can be used) for the duration of the COVID-19 declaration under Section 564(b)(1) of the Act, 21 U.S.C. section 360bbb-3(b)(1), unless the authorization is terminated or revoked.  Performed at [facility], [address], [place], [place] [address]     Radiology Studies: ECHOCARDIOGRAM COMPLETE Result Date: 03/02/2024    ECHOCARDIOGRAM REPORT   Patient Name:   [name] [name] General Date of Exam: 03/02/2024 Medical Rec #:  [id number]      Height:       71.0 in Accession #:    [id number]      Weight:       214.9 lb Date of Birth:  1946/04/19      BSA:          2.174 m Patient Age:    78 years       BP:           166/95 mmHg Patient Gender: M              HR:           50 bpm. Exam Location:  Inpatient Procedure: 2D Echo, Color Doppler and Cardiac Doppler (Both Spectral and Color            Flow Doppler were utilized during procedure). Indications:    Elevated troponin  History:        Patient has prior history of Echocardiogram examinations, most                 recent 10/09/2019. CVA.  Sonographer:    Benard Stallion Referring Phys: [id number] SARA-MAIZ A THOMAS IMPRESSIONS  1. Left ventricular ejection fraction, by estimation, is 60 to 65%. The left ventricle has normal function. The left ventricle has no regional wall motion abnormalities. Left ventricular diastolic parameters were normal.  2. Right ventricular systolic function is normal. The right ventricular size is normal. There is normal pulmonary artery systolic pressure. The estimated right ventricular systolic pressure is 30.7 mmHg.  3. The mitral valve is degenerative. Mild mitral valve regurgitation. No evidence of mitral stenosis.  4. The aortic valve is tricuspid. Aortic valve regurgitation is mild to moderate. No aortic stenosis is present.  5. 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 Aortic dilatation noted. Aneurysm of the ascending aorta, measuring 50 mm. There is dilatation of the aortic arch, measuring 38 mm.  6. The inferior vena cava is dilated in size with >50% respiratory variability, suggesting right atrial pressure of  8 mmHg. Conclusion(s)/Recommendation(s): Recommend CTA or MRA of the chest, abdomen, pelvis, to further evaluate the aorta. Abdominal aorta duplex to evaluate for abdominal aortic aneurysm. Clinical correlation required. Results conveyed to Dr. Alejandro Lazarus Marker via secure chat. FINDINGS  Left Ventricle: Left ventricular ejection fraction, by estimation, is 60 to 65%. The left ventricle  has normal function. The left ventricle has no regional wall motion abnormalities. There is no left ventricular hypertrophy of the basal-septal segment. Left ventricular diastolic parameters were normal. Right Ventricle: The right ventricular size is normal. No increase in right ventricular wall thickness. Right ventricular systolic function is normal. There is normal pulmonary artery systolic pressure. The tricuspid regurgitant velocity is 2.38 m/s, and  with an assumed right atrial pressure of 8 mmHg, the estimated right ventricular systolic pressure is 30.7 mmHg. Left Atrium: Left atrial size was normal in size. Right Atrium: Right atrial size was normal in size. Pericardium: Trivial pericardial effusion is present. Mitral Valve: The mitral valve is degenerative in appearance. Mild mitral valve regurgitation. No evidence of mitral valve stenosis. Tricuspid Valve: The tricuspid valve is grossly normal. Tricuspid valve regurgitation is trivial. No evidence of tricuspid stenosis. Aortic Valve: The aortic valve is tricuspid. Aortic valve regurgitation is mild to moderate. No aortic stenosis is present. Aortic valve mean gradient measures 4.5 mmHg. Aortic valve peak gradient measures 8.4 mmHg. Aortic valve area, by VTI measures 3.21 cm. Pulmonic Valve: The pulmonic valve was normal in structure. Pulmonic valve regurgitation is trivial. No evidence of pulmonic stenosis. Aorta: Aneurysmal dilatation of the abdominal aorta, 31mm (limited views). The aortic root is normal in size and structure and aortic dilatation noted. There is dilatation of the aortic arch, measuring 38 mm. There is an aneurysm involving the ascending aorta measuring 50 mm. Venous: The inferior vena cava is dilated in size with greater than 50% respiratory variability, suggesting right atrial pressure of 8 mmHg. IAS/Shunts: The atrial septum is grossly normal.  LEFT VENTRICLE PLAX 2D LVIDd:         5.10 cm   Diastology LVIDs:         3.20 cm   LV e'  medial:    8.38 cm/s LV PW:         1.20 cm   LV E/e' medial:  9.4 LV IVS:        1.20 cm   LV e' lateral:   11.90 cm/s LVOT diam:     2.20 cm   LV E/e' lateral: 6.6 LV SV:         103 LV SV Index:   47 LVOT Area:     3.80 cm  RIGHT VENTRICLE RV Basal diam:  3.00 cm RV Mid diam:    2.70 cm RV S prime:     15.00 cm/s TAPSE (M-mode): 2.3 cm LEFT ATRIUM           Index        RIGHT ATRIUM           Index LA diam:      2.90 cm 1.33 cm/m   RA Area:     16.10 cm LA Vol (A2C): 81.0 ml 37.26 ml/m  RA Volume:   31.80 ml  14.63 ml/m LA Vol (A4C): 55.2 ml 25.39 ml/m  AORTIC VALVE AV Area (Vmax):    3.22 cm AV Area (Vmean):   3.21 cm AV Area (VTI):     3.21 cm AV Vmax:  145.00 cm/s AV Vmean:          96.250 cm/s AV VTI:            0.320 m AV Peak Grad:      8.4 mmHg AV Mean Grad:      4.5 mmHg LVOT Vmax:         123.00 cm/s LVOT Vmean:        81.200 cm/s LVOT VTI:          0.270 m LVOT/AV VTI ratio: 0.84  AORTA Ao Root diam: 3.70 cm Ao Asc diam:  5.00 cm MITRAL VALVE               TRICUSPID VALVE MV Area (PHT): 3.27 cm    TR Peak grad:   22.7 mmHg MV Decel Time: 232 msec    TR Vmax:        238.00 cm/s MV E velocity: 79.10 cm/s MV A velocity: 85.40 cm/s  SHUNTS MV E/A ratio:  0.93        Systemic VTI:  0.27 m                            Systemic Diam: 2.20 cm Sunit Tolia Electronically signed by Madonna Large Signature Date/Time: 03/02/2024/1:09:37 PM    Final    EEG adult Result Date: 03/01/2024 Shelton Arlin KIDD, MD     03/01/2024 10:18 AM Patient Name: [name] [name] MRN: [id number] Epilepsy Attending: Arlin KIDD Shelton Referring Physician/Provider: Debby Camila LABOR, MD Date: 03/01/2024 Duration: 22.02 mins Patient history: 78yo M with ams. EEG to evaluate for seizure Level of alertness: Awake AEDs during EEG study: None Technical aspects: This EEG study was done with scalp electrodes positioned according to the 10-20 International system of electrode placement. Electrical activity was reviewed with band pass  filter of 1-70Hz , sensitivity of 7 uV/mm, display speed of 74mm/sec with a 60Hz  notched filter applied as appropriate. EEG data were recorded continuously and digitally stored.  Video monitoring was available and reviewed as appropriate. Description: The posterior dominant rhythm consists of 8 Hz activity of moderate voltage (25-35 uV) seen predominantly in posterior head regions, symmetric and reactive to eye opening and eye closing. EEG showed intermittent generalized 3 to 6 Hz theta-delta slowing. Hyperventilation and photic stimulation were not performed.   ABNORMALITY - Intermittent slow, generalized IMPRESSION: This study is suggestive of mild diffuse encephalopathy. No seizures or epileptiform discharges were seen throughout the recording. Arlin KIDD Shelton   DG CHEST PORT 1 VIEW Result Date: 03/01/2024 CLINICAL DATA:  Little status changes. EXAM: PORTABLE CHEST 1 VIEW COMPARISON:  09/19/2023 FINDINGS: The cardio pericardial silhouette is enlarged. New retrocardiac atelectasis or infiltrate noted with probable left pleural effusion. Descending thoracic aortic endograft evident. Right lung clear. No acute bony abnormality. IMPRESSION: New retrocardiac atelectasis or infiltrate with probable left pleural effusion. Electronically Signed   By: Camellia Candle M.D.   On: 03/01/2024 06:12   Scheduled Meds:  amLODipine   2.5 mg Oral Daily   aspirin  EC  81 mg Oral Daily   carvedilol   25 mg Oral BID WC   heparin   5,000 Units Subcutaneous Q8H   hydrALAZINE   10 mg Oral Q6H   pantoprazole   40 mg Oral Daily   rosuvastatin   10 mg Oral Daily   Continuous Infusions:  sodium chloride  75 mL/hr at 03/02/24 1642    LOS: 1 day   Alejandro Marker, DO [facility] Available via [employer]  secure chat 7am-7pm After these hours, please refer to coverage provider listed on [email or web address] 03/02/2024, 11:20 PM

## 2024-03-02 NOTE — Hospital Course (Signed)
 The patient is a 78 year old African-American male with a past medical history significant for but not limited to chronic back pain, GERD, hypertension, hyperlipidemia, history of AAA status post repair with endoleak, dementia and other comorbidities who presented to the ED with worsening confusion has been progressive last 24 hours associated with generalized weakness that happened hours prior to admission.  Per his wife he was unable to get up from the commode due to his significant weakness and had some difficulty following commands and was noted to be very confused so EMS was called.  His mentation is improving however he continues to only be oriented to himself does not know the year or the month.  Wife states that is not baseline so he is admitted for further evaluation and was found to be globally weak.  Labs were done and showed that he had a mild AKI.SABRA  Given his findings on the CT of the abdomen with his AAA measuring 5.7 x 4.4 cm vascular surgery was consulted for further evaluation and since this showed progression in the infrarenal aorta and given that he is an asymptomatic diabetic.  They are recommending outpatient treatment for aneurysmal degeneration.   Assessment and Plan:  Acute Encephalopathy: Improved. MRI negative and cva ruled out.  -? Related uncontrolled hypertension. Neuro workup initiated and ordered Ammonia was 28, UDS Negative, vbg , TSH was 1.065. EEG was suggestive of mild diffuse encephalopathy with no seizures or epileptiform discharges seen throughout the recording.  Global weakness in setting of chronic debility:  Uses walker at baseline  -Due to acute medical illness nos ? Viral prodrome/AKI. Total CK neg  -Held statin for now. RVP Negative. F/u inflammatory markers elevated as CRP is 22.0 and ESR is 65 - PT/OT evaluating and currently recommending to improve progress and will monitor overnight and re-evaluate functional status. If it is slower than expected they are  recommending SNF but     Essential Hypertension uncontrolled: resume home regimen of Carvedilol  25 mg po BID, Amlodipine  2.5 mg po Daily, and Hydralazine  10 mg q6h. Continue to monitor blood pressures per protocol.  Continue with prn labetalol . Last BP reading was 162/90   AKI: BUN/Cr Trend: Recent Labs  Lab 02/29/24 1903 03/01/24 0620 03/02/24 0801  BUN 15 13 10   CREATININE 1.47* 1.43* 1.34*  -IVF with NS @ 75 mL/hr -Avoid Nephrotoxic Medications, Contrast Dyes, Hypotension and Dehydration to Ensure Adequate Renal Perfusion and will need to Renally Adjust Meds -Continue to Monitor and Trend Renal Function carefully and repeat CMP in the AM    Hypokalemia: Mild and Improved. 3.3 -> 3.8. Replete. CTM and repelte as Necessary.     Abn CE : due to demand  in setting of uncontrolled blood pressure. Noted no significant delta. Continued to monitor ce over night to be complete, echo ordered and pending   AAA with Endo Leak/Aortic Dissection: CT noted enlarging AAA. Vascular consulted and noted no acute intervention currently But is a candidate for intervention regarding enlarging AAA. Vascular Recommending F/U in the outpt setting 4-6 weeks to discuss Endovascular Repair possibly requiring Tambe versus modified Cook graft   GERD/GI Prophylaxis: C/w PPI Pantoprazole     Chronic Back Pain: No acute issue.C/w supportive care    HLD: Holding statin for now  Thrombocytopenia: Plt Count went from 135 -> 141. CTM and Trend and repaet CMP in the AM  Hypoalbuminemia: Patient's Albumin Lvl went from 3.5 -> 2.7. CTM and Trend and Repeat CMP in the AM   Overweight:  Complicates overall prognosis and care. Estimated body mass index is 29.99 kg/m as calculated from the following:   Height as of this encounter: 5' 10.98 (1.803 m).   Weight as of this encounter: 97.5 kg. Weight Loss and Dietary Counseling given

## 2024-03-02 NOTE — Progress Notes (Signed)
 OT Cancellation Note  Patient Details Name: [name] [name] MRN: [id number] DOB: 04/25/1946   Cancelled Treatment:    Reason Eval/Treat Not Completed: Patient at procedure or test/ unavailable Pt unable to participate in OT eval at this time. Currently receiving bedside Echo. Will re-attempt when able.   Leita Howell, OTR/L,CBIS  Supplemental OT - [facility] and [facility] Secure Chat Preferred   03/02/2024, 9:11 AM

## 2024-03-02 NOTE — Evaluation (Signed)
 Occupational Therapy Evaluation Patient Details Name: [name] [name] MRN: [id number] DOB: 12-28-1945 Today's Date: 03/02/2024   History of Present Illness   78 y.o. male with medical history significant of   Chronic back pain , GERD, HLD, HTN , AAA s/p repair with endo leak,Dementia, who presents to ED with with confusion x 3 that has been progressive over the last  24 hours and now also associated with weakness x hours PTA. Per wife patient was unable to get up from commode due to weakness. He was also having difficulty following commands and was noted to be very confused due to this EMS was called.     Clinical Impressions Pt admitted with the above diagnosis. Pt currently with functional limitations due to the deficits listed below (see OT Problem List). Pripr to admit, pt was living at home with his wife. He reports that he was Mod I for BADL tasks and utilized RW for ambulation. Pt will benefit from acute skilled OT to increase their safety and independence with ADL and functional mobility for ADL to facilitate discharge. Pt has all necessary DME. Recommend follow up HHOT services upon discharge to further focus on improving functional performance during ADL tasks.       If plan is discharge home, recommend the following:   A lot of help with walking and/or transfers;A lot of help with bathing/dressing/bathroom;Help with stairs or ramp for entrance;Assist for transportation;Supervision due to cognitive status     Functional Status Assessment   Patient has had a recent decline in their functional status and demonstrates the ability to make significant improvements in function in a reasonable and predictable amount of time.     Equipment Recommendations   None recommended by OT     Recommendations for Other Services   PT consult     Precautions/Restrictions   Precautions Precautions: Fall Recall of Precautions/Restrictions: Impaired Precaution/Restrictions Comments:  history of dementia Restrictions Weight Bearing Restrictions Per Provider Order: No     Mobility Bed Mobility Overal bed mobility: Needs Assistance Bed Mobility: Supine to Sit, Sit to Supine     Supine to sit: Mod assist, HOB elevated, Used rails Sit to supine: Min assist   General bed mobility comments: Increased time to complete bed mobility transition with max VC for technique and sequencing.    Transfers Overall transfer level: Needs assistance Equipment used: Rolling walker (2 wheels) Transfers: Sit to/from Stand Sit to Stand: Min assist, From elevated surface           General transfer comment: No recliner available to assess transfer during evaluation. Pt educated on proper hand placement with RW management prior to sit<>stand transition. Max VC to lean trunk forward, push feet into floor, squeeze glutes, and push in RW handles with BUE to bring trunk upright to stand. Pt able to complete lateral side steps to the right towards [facility] prior to sitting back down.      Balance Overall balance assessment: Needs assistance Sitting-balance support: Bilateral upper extremity supported, Feet supported Sitting balance-Leahy Scale: Poor Sitting balance - Comments: Min A required initially to maintain static sitting balance while EOB. With VC to correct sitting posture, pt was able to maintain sitting balance with close SBA. Required BUE for support. Postural control: Posterior lean Standing balance support: Bilateral upper extremity supported, During functional activity, Reliant on assistive device for balance Standing balance-Leahy Scale: Poor       ADL either performed or assessed with clinical judgement   ADL  Grooming: Set up;Sitting   Upper Body Bathing: Set up;Sitting   Lower Body Bathing: Total assistance;Bed level   Upper Body Dressing : Set up;Sitting   Lower Body Dressing: Total assistance;Bed level          Vision Baseline Vision/History: 1 Wears  glasses Ability to See in Adequate Light: 0 Adequate Patient Visual Report: No change from baseline Vision Assessment?: Wears glasses for reading     Perception Perception: Within Functional Limits       Praxis Praxis: WFL       Pertinent Vitals/Pain Pain Assessment Pain Assessment: No/denies pain     Extremity/Trunk Assessment Upper Extremity Assessment Upper Extremity Assessment: Right hand dominant;Generalized weakness   Lower Extremity Assessment Lower Extremity Assessment: Defer to PT evaluation   Cervical / Trunk Assessment Cervical / Trunk Assessment: Kyphotic   Communication Communication Communication: No apparent difficulties   Cognition Arousal: Alert Behavior During Therapy: WFL for tasks assessed/performed Cognition: History of cognitive impairments    OT - Cognition Comments: History of dementia    Following commands: Intact       Cueing  General Comments   Cueing Techniques: Verbal cues  VSS on RA. Gown and bed pad wet upon therapy arrival and changed during session. Pt was unware of being wet.           Home Living Family/patient expects to be discharged to:: Private residence Living Arrangements: Spouse/significant other Available Help at Discharge: Family Type of Home: House Home Access: Stairs to enter [employer] of Steps: 6 then a landing then 6 more Entrance Stairs-Rails: Right;Left;Can reach both Home Layout: Two level;Able to live on main level with bedroom/bathroom;Bed/bath upstairs     Bathroom Shower/Tub: Walk-in shower;Tub/shower unit   Bathroom Toilet: Handicapped height Bathroom Accessibility: Yes How Accessible: Accessible via walker Home Equipment: Tub bench;Rolling Walker (2 wheels);Rollator (4 wheels);Hand held shower head;BSC/3in1;Grab bars - toilet;Wheelchair - manual   Additional Comments: Enjoys watching TV and walking outside on his driveway      Prior Functioning/Environment Prior Level of  Function : Independent/Modified Independent    Mobility Comments: Normally using RW for ambulation      OT Problem List: Decreased strength;Decreased cognition;Decreased activity tolerance;Decreased safety awareness;Impaired balance (sitting and/or standing);Decreased knowledge of use of DME or AE   OT Treatment/Interventions: Self-care/ADL training;Therapeutic exercise;Therapeutic activities;DME and/or AE instruction;Patient/family education;Balance training      OT Goals(Current goals can be found in the care plan section)   Acute Rehab OT Goals Patient Stated Goal: to get stronger and return to walking OT Goal Formulation: With patient Time For Goal Achievement: 03/16/24 Potential to Achieve Goals: Good   OT Frequency:  Min 2X/week       AM-PAC OT 6 Clicks Daily Activity     Outcome Measure Help from another Prak eating meals?: None Help from another [place] taking care of personal grooming?: None Help from another [place]perez toileting, which includes using toliet, bedpan, or urinal?: Total Help from another Marks bathing (including washing, rinsing, drying)?: A Lot Help from another [place] to put on and taking off regular upper body clothing?: A Little Help from another [place] to put on and taking off regular lower body clothing?: Total 6 Click Score: 15   End of Session Equipment Utilized During Treatment: Gait belt;Rolling walker (2 wheels)  Activity Tolerance: Patient tolerated treatment well Patient left: in bed;with call bell/phone within reach;with bed alarm set;Other (comment);with family/visitor present (bed in chair position)  OT Visit Diagnosis: Unsteadiness on feet (R26.81);Muscle weakness (  generalized) (M62.81);Other symptoms and signs involving cognitive function                Time: 1010-1050 OT Time Calculation (min): 40 min Charges:  OT General Charges $OT Visit: 1 Visit OT Evaluation $OT Eval Moderate Complexity: 1 Mod OT Treatments $Self Care/Home  Management : 8-22 mins $Therapeutic Activity: 8-22 mins  Leita Howell, OTR/L,CBIS  Supplemental OT - [facility] and [facility] Secure Chat Preferred    Nthony Lefferts, Leita BIRCH 03/02/2024, 11:01 AM

## 2024-03-02 NOTE — TOC CM/SW Note (Signed)
 Transition of Care [facility]) - Inpatient Brief Assessment   Patient Details  Name: [name] [name] MRN: [id number] Date of Birth: 12-21-1945  Transition of Care [facility]) CM/SW Contact:    Lauraine FORBES Saa, LCSW Phone Number: 03/02/2024, 11:39 AM   Clinical Narrative:  11:39 AM Per chart review, patient resides at home with spouse. Patient has a PCP and insurance. Patient does not have SNF history. Patient has history with [facility]. Patient has HH history with [facility]. Patient has DME (3 in 1, rolling walker, wheelchair, TTB) history with [employer]. Patient's preferred pharmacy's are [facility] [place]  and [facility] [address]. No TOC needs were identified at this time. TOC will continue to follow and be available to assist.  Transition of Care Asessment: Insurance and Status: Insurance coverage has been reviewed Patient has primary care physician: Yes Home environment has been reviewed: Private Residence Prior level of function:: N/A Prior/Current Home Services: No current home services (Has HH/DME history) Social Drivers of Health Review: SDOH reviewed no interventions necessary Readmission risk has been reviewed: Yes Transition of care needs: no transition of care needs at this time

## 2024-03-03 ENCOUNTER — Inpatient Hospital Stay (HOSPITAL_COMMUNITY)

## 2024-03-03 DIAGNOSIS — R531 Weakness: Secondary | ICD-10-CM | POA: Diagnosis not present

## 2024-03-03 DIAGNOSIS — G9340 Encephalopathy, unspecified: Secondary | ICD-10-CM | POA: Diagnosis not present

## 2024-03-03 MED ORDER — HYDRALAZINE HCL 20 MG/ML IJ SOLN
10.0000 mg | INTRAMUSCULAR | Status: DC | PRN
  Administered 2024-03-03: 10 mg via INTRAVENOUS
  Filled 2024-03-03: qty 1

## 2024-03-03 MED ORDER — ACETAMINOPHEN 325 MG PO TABS
650.0000 mg | ORAL_TABLET | Freq: Four times a day (QID) | ORAL | Status: DC | PRN
  Administered 2024-03-03 – 2024-03-05 (×3): 650 mg via ORAL
  Filled 2024-03-03 (×3): qty 2

## 2024-03-03 NOTE — TOC Initial Note (Signed)
 Transition of Care [facility]) - Initial/Assessment Note    Patient Details  Name: [name] [name] MRN: [id number] Date of Birth: [birth year]  Transition of Care [facility]) CM/SW Contact:    Inocente RAMAN Kindle, LCSW Phone Number: 03/03/2024, 1:35 PM  Clinical Narrative:                 CSW received consult for possible SNF placement at time of discharge. CSW spoke with patient's spouse. Patient's spouse reported that patient's spouse is currently unable to care for patient at their home given patient's current physical needs and fall risk. She expressed understanding of PT recommendation and is agreeable to SNF placement at time of discharge. She stated she has vision issues and cannot drive. She does not want [employer]. CSW discussed insurance authorization process and will provide Medicare SNF ratings list. CSW will send out referrals for review and provide bed offers as available.   Skilled Nursing Rehab Facilities-   [email or web address]   Ratings out of 5 stars (5 the highest)  Name Address  Phone # Quality Care Staffing Health Inspection Overall  [facility] [address] [phone] 3 1 4 3   [facility] [address], [place] [phone] 5 2 4 5   [facility] [address], [place] [phone] 2 1 1 1   [facility] [address] [place] [phone] 4 3 4 4   [facility]  [address], [place] [phone] 5 3 5 5   [facility] [address], [facility] [phone] 5 3 2 3   [facility] [address], [place] [phone] 5 1 2 2   [facility] [address], [place] [phone] 2 3 4 4   [address] ([place]) [address] [place] [phone] 1 3 3 2   [facility] [address] [place], [place] [phone] 3 1 2 1   [facility] ([place]) [address] [place], [place] [phone] 3 1 1 1   Clotilda Pereyra [address] [place], [place] [phone] 3 3 4 4   [facility] [address] [place] [phone] 4 1 2 1   [facility] ([facility]) [address], [place] [phone] 1 1 4 2           [facility] [address] [place] [phone] 3 1 5 4   [facility] [address] [place] [phone] 4 1 1 1   [facility]  [address], [place] [phone] 2 3 1 1   [facility] [address] [phone] 2 2 4 4   [facility] [address] [place] [address], [place] [phone] 2 2 3 3           [facility] [address] [place] [phone] 2 1 2 1   [facility] (No UHC) [address], [place] [phone] 5 4 4 5   [facility] [address] [facility] [phone] 3 4 4 4   [facility] [address], [place] [phone] 3 1 2 1   [facility] [address] [place] [phone] 3 2 2 2   [facility] [address] [place] [phone] 1 3 3 2   [facility] [address] [place] [phone] 2 2 3 3   [facility] [address] [place], [place] [phone] 2 1 4 3   [facility] [address], [facility] [phone] 2 1 1 1   [facility] [address] [place], [place] [phone] 2 1 2 1   [facility] [address]  [place] [phone] 3 1 2 1           [facility] [address], [place] [phone] 4 1 2 1   [place] [address] [place]  [phone] 3 4 4 4   [facility] (No Humana) [address] [place] [phone] 3 2 5 5   [facility]) [address], [place] [phone] 2 2 3 3   [facility] [address] [place] [phone] 5 3 5 5   [facility] [address] [place], [place] [phone] 2 1 1 1   [facility] [address] [place], [place] [phone] 3 5 5 5           [facility] [address] [place], [place] [phone] 5 4 5 5    [facility])  [address] [place] [phone] 1 1 2 1   [facility]) [address] [place] [phone]  2 4 4   [facility] [address] [place] [phone] 3 5 5 5   [address] [place], [place] [phone] 4 2 2 2   [facility]) [address] [place] [phone] 1 1 3 1   [facility] [address] [place] [phone] 2 2 2 2      Expected Discharge Plan: Skilled Nursing Facility Barriers to Discharge: Continued Medical Work up, English as a second language teacher, SNF Pending bed offer   Patient Goals and CMS Choice Patient states their goals for this hospitalization and ongoing recovery are:: Rehab CMS Medicare.gov Compare Post Acute Care list provided to:: Patient Represenative (must comment) Choice offered to / list presented to : Spouse  ownership interest in [facility].provided to:: Spouse    Expected Discharge Plan and Services In-house Referral: Clinical Social Work   Post Acute Care Choice: Skilled Nursing Facility Living arrangements for the past 2 months: Single Family Home                                      Prior Living Arrangements/Services Living arrangements for the past 2 months: Single Family Home Lives with:: Spouse Patient language and need for interpreter reviewed:: Yes Do you feel safe going back to the place where you live?: Yes      Need for Family Participation in Patient Care: Yes (Comment) Care giver support system in place?: Yes (comment)   Criminal Activity/Legal Involvement Pertinent to Current Situation/Hospitalization: No - Comment as needed  Activities of Daily Living   ADL Screening (condition at time of admission) Independently performs ADLs?: Yes (appropriate for developmental age) Is the patient deaf or have difficulty hearing?: No Does the patient have difficulty seeing, even when wearing  glasses/contacts?: No Does the patient have difficulty concentrating, remembering, or making decisions?: No  Permission Sought/Granted Permission sought to share information with : Facility Medical sales representative, Family Supports Permission granted to share information with : Yes, Verbal Permission Granted  Share Information with NAME: Velia  Permission granted to share info w AGENCY: SNFs  Permission granted to share info w Relationship: Spouse  Permission granted to share info w Contact Information: [phone]  Emotional Assessment Appearance:: Appears stated age Attitude/Demeanor/Rapport: Unable to Assess Affect (typically observed): Accepting Orientation: : Oriented to Self, Oriented to Place, Oriented to  Time Alcohol / Substance Use: Not Applicable Psych Involvement: No (comment)  Admission diagnosis:  Bilateral leg weakness [R29.898] Acute encephalopathy [G93.40] Aortic aneurysm with dissection (HCC) [  I71.00] Altered mental status, unspecified altered mental status type [R41.82] Patient Active Problem List   Diagnosis Date Noted   Acute encephalopathy 03/01/2024   Allergic rhinitis 09/21/2020   Benign prostatic hyperplasia 09/21/2020   Body mass index (BMI) 31.0-31.9, adult 09/21/2020   Cerebral infarction (HCC) 09/21/2020   Diabetic foot (HCC) 09/21/2020   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   Dysarthria 09/21/2020   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   Gait difficulty 09/21/2020   History of psychiatric disorder 09/21/2020   History of colonic polyps 09/21/2020   Pure hypercholesterolemia 09/21/2020   Unspecified thoracic, thoracolumbar and lumbosacral intervertebral disc disorder 09/21/2020   Hypertension    Hyperlipidemia    GERD (gastroesophageal reflux disease)    DM (diabetes mellitus) (HCC)    CKD (chronic kidney disease), stage III (HCC)    Normocytic anemia    Thrombocytopenia (HCC)    UTI (urinary tract infection)    AMS  (altered mental status)    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   History of repair of dissecting thoracic aneurysm    Abdominal distension    Pulmonary embolus (HCC) 10/19/2019   CVA (cerebral vascular accident) (HCC) 10/19/2019   AKI (acute kidney injury) (HCC) 10/19/2019   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 [place] [address] Phone: [phone] Fax: [phone]     Social Drivers of Health (SDOH) Social History: SDOH Screenings   Food Insecurity: No Food Insecurity (03/01/2024)  Housing: Low Risk  (03/01/2024)  Transportation Needs: No Transportation Needs (03/01/2024)  Utilities: Not At Risk (03/01/2024)  Depression (PHQ2-9): Medium Risk (11/14/2020)  Social Connections: Moderately Integrated (03/01/2024)  Tobacco Use: Medium Risk (03/01/2024)   SDOH Interventions:     Readmission Risk Interventions     No data to display

## 2024-03-03 NOTE — Progress Notes (Signed)
 Occupational Therapy Treatment Patient Details Name: [name] [name] MRN: [id number] DOB: [birth year] Today's Date: 03/03/2024   History of present illness [name] [name] is a 78 y.o. male, who presented to the emergency department yesterday with weakness in his legs. He was unable to get off of the toilet. He was unable to walk in the hospital last night. He is also been confused over the past 24 hours. 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 Sheree in 2021. Increased c/o R chest pain 7/14 PM and 7/14 CXR shows stable left basilar atelectasis, will demonstrating no evidence of interval or acute cardiopulmonary process, including no evidence of infiltrate, edema, effusion, or pneumothorax. 7/14 EKG shows sinus bradycardia with heart rate 57, normal intervals, and no evidence of T wave or ST changes, including no evidence of ST elevation. Past medical history: Chronic back pain, GERD, HLD, HTN, AAA s/p repair with endo leak, Dementia.   OT comments  Pt in chair upon arrival with wife present and supportive. Pt agreeable to standing at RW  requiring mod/min A to participate in  bathing and dressing tasks, SPTs to [facility]. Pt and family report they are now agreeable to consider post-acute rehab options as pt needing increased physical assist with ADLs and ADL mobility at this time. OT updating recommendations for post acute rehab and RN aware. OT will continue to follow acutely to maximize level of function and safety      If plan is discharge home, recommend the following:  A lot of help with walking and/or transfers;A lot of help with bathing/dressing/bathroom;Help with stairs or ramp for entrance;Assist for transportation;Supervision due to cognitive status   Equipment Recommendations  Other (comment) (defer)     Recommendations for Other Services      Precautions / Restrictions Precautions Precautions: Fall Recall of Precautions/Restrictions: Impaired Precaution/Restrictions Comments: history of dementia, R chest pain 7/14 PM and 7/15 AM. Restrictions Weight Bearing Restrictions Per Provider Order: No       Mobility Bed Mobility               General bed mobility comments: pt in chair upon arrival    Transfers Overall transfer level: Needs assistance Equipment used: Rolling walker (2 wheels) Transfers: Sit to/from Stand Sit to Stand: Min assist, Mod assist           General transfer comment: mod A initial stand from chair     Balance Overall balance assessment: Needs assistance Sitting-balance support: Bilateral upper extremity supported, Feet supported Sitting balance-Leahy Scale: Poor Sitting balance - Comments: reliant on BUE support at EOB   Standing balance support: Bilateral upper extremity supported, During functional activity, Reliant on assistive device for balance Standing balance-Leahy Scale: Poor Standing balance comment: stod at RW for barhing and UB dressing                           ADL either performed or assessed with clinical judgement   ADL Overall ADL's : Needs assistance/impaired     Grooming: Wash/dry hands;Wash/dry face;Contact guard assist;Sitting       Lower Body Bathing: Maximal assistance;Sit to/from stand   Upper Body Dressing : Minimal assistance;Standing       Toilet Transfer: Moderate assistance;Minimal assistance;Rolling walker (2 wheels);Stand-pivot;Cueing for safety;Cueing for sequencing  Toileting- Clothing Manipulation and Hygiene: Maximal assistance;Sit to/from stand       Functional mobility during ADLs: Moderate assistance;Minimal assistance;Rolling walker (2 wheels);Cueing for safety General ADL Comments: pt stood at [facility] for UB/LB bathing, UB dressing    Extremity/Trunk Assessment Upper Extremity  Assessment Upper Extremity Assessment: Generalized weakness   Lower Extremity Assessment Lower Extremity Assessment: Defer to PT evaluation   Cervical / Trunk Assessment Cervical / Trunk Assessment: Kyphotic    Vision Ability to See in Adequate Light: 0 Adequate Patient Visual Report: No change from baseline     Perception     Praxis     Communication Communication Communication: No apparent difficulties   Cognition   Behavior During Therapy: WFL for tasks assessed/performed, Anxious Cognition: History of cognitive impairments             OT - Cognition Comments: History of dementia                          Cueing      Exercises      Shoulder Instructions       General Comments SpO2/HR WFL, HR ~63 bpm sitting in chair post-exertion. Pt c/o msk pain and R ribcage sharp pain with deep breaths; pt bed linens wet and removed by PTA, new linens placed in room    Pertinent Vitals/ Pain       Pain Assessment Pain Assessment: Faces Faces Pain Scale: Hurts even more Pain Location: R chest and ribcage with AROM of RUE and pushing to sit up/using RW, etc Pain Descriptors / Indicators: Discomfort, Grimacing, Guarding, Sharp, Sore Pain Intervention(s): Limited activity within patient's tolerance, Repositioned, Premedicated before session  Home Living                                          Prior Functioning/Environment              Frequency  Min 2X/week        Progress Toward Goals  OT Goals(current goals can now be found in the care plan section)  Progress towards OT goals: Progressing toward goals     Plan      Co-evaluation                 AM-PAC OT 6 Clicks Daily Activity     Outcome Measure   Help from another [place]app[place] eating meals?: None Help from another [place] taking care of personal grooming?: A Little Help from another Hersh toileting, which includes using toliet, bedpan, or urinal?: A Lot Help  from another [place] bathing (including washing, rinsing, drying)?: A Lot Help from another [place] to put on and taking off regular upper body clothing?: A Little Help from another Almanzar to put on and taking off regular lower body clothing?: A Lot 6 Click Score: 16    End of Session Equipment Utilized During Treatment: Gait belt;Rolling walker (2 wheels);Other (comment) (BSC)  OT Visit Diagnosis: Unsteadiness on feet (R26.81);Muscle weakness (generalized) (M62.81);Other symptoms and signs involving cognitive function   Activity Tolerance Patient tolerated treatment well;Patient limited by pain   Patient Left with call bell/phone within reach;with family/visitor present;with chair alarm set   Nurse Communication Mobility status        Time: 8586-8567 OT Time Calculation (min): 19 min  Charges: OT General Charges $OT Visit: 1 Visit OT Treatments $Self  Care/Home Management : 8-22 mins    Jacques Aquas [facility] 03/03/2024, 2:42 PM

## 2024-03-03 NOTE — Progress Notes (Signed)
 Physical Therapy Treatment Patient Details Name: [name] [name] MRN: [id number] DOB: 06-Jan-1946 Today's Date: 03/03/2024   History of Present Illness [name] [name] is a 78 y.o. male, who presented to the emergency department yesterday with weakness in his legs. He was unable to get off of the toilet. He was unable to walk in the hospital last night. He is also been confused over the past 24 hours. 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 Sheree in 2021. Increased c/o R chest pain 7/14 PM and 7/14 CXR shows stable left basilar atelectasis, will demonstrating no evidence of interval or acute cardiopulmonary process, including no evidence of infiltrate, edema, effusion, or pneumothorax. 7/14 EKG shows sinus bradycardia with heart rate 57, normal intervals, and no evidence of T wave or ST changes, including no evidence of ST elevation. Past medical history: Chronic back pain, GERD, HLD, HTN, AAA s/p repair with endo leak, Dementia.    PT Comments  Pt received in supine after eating lunch, pt and spouse agreeable to therapy session with emphasis on transfer and gait training with RW. Pt remains limited due to R musculoskeletal or pleural pain with c/o pain around R upper ribcage and increased pain with RUE AROM and deep breaths. Pt needing increased time, multimodal cues and up to modA to perform sit<>stand, bed mobility and consistent minA for safety with short distance gait trial in his room. Standing/gait tolerance limited due to c/o pain. Pt and family report they are now agreeable to look into post-acute rehab options as pt needing increased physical assist at this time. Patient will benefit from continued inpatient follow up therapy, <3 hours/day, discussed disposition update with supervising PT [facility] in addition to care  team and pt/family.      If plan is discharge home, recommend the following: A lot of help with bathing/dressing/bathroom;Assist for transportation;Help with stairs or ramp for entrance;Two people to help with walking and/or transfers;Assistance with cooking/housework;Direct supervision/assist for medications management;Direct supervision/assist for financial management;Supervision due to cognitive status   Can travel by private vehicle     No  Equipment Recommendations  BSC/3in1 (ramp or chair lift for his stairs at home; otherwise may need PTAR transport for home)    Recommendations for Other Services       Precautions / Restrictions Precautions Precautions: Fall Recall of Precautions/Restrictions: Impaired Precaution/Restrictions Comments: history of dementia, R chest pain 7/14 PM and 7/15 AM. Restrictions Weight Bearing Restrictions Per Provider Order: No     Mobility  Bed Mobility Overal bed mobility: Needs Assistance Bed Mobility: Supine to Sit, Rolling Rolling: Min assist, Used rails   Supine to sit: Mod assist, HOB elevated, Used rails     General bed mobility comments: HOB 25 deg and pt encouraged not to use bed rails if possible since he sleeps on a flat bed at home. Greatly increased time to initiate and perform task, esp due to c/o RUE pain and R ribcage/possibly pleural pain.    Transfers Overall transfer level: Needs assistance Equipment used: Rolling walker (2 wheels) Transfers: Sit to/from Stand Sit to Stand: Min assist           General transfer comment: pt using LUE on back of locked recliner chair to simulate home technique of pulling up on dresser in front of bed to stand.  Pt cued to push from bed or R knee with RUE as able given R sided UE or chest/ribcage pain.    Ambulation/Gait Ambulation/Gait assistance: Min assist, +2 safety/equipment Gait Distance (Feet): 18 Feet Assistive device: Rolling walker (2 wheels) Gait Pattern/deviations:  Step-through pattern, Decreased stride length, Trunk flexed, Drifts right/left, Shuffle       General Gait Details: Close chair follow for safety; minA for RW management/stability and cues for forward gaze.   Stairs Stairs:  (Pt pain too severe to perform today)           Wheelchair Mobility     Tilt Bed    Modified Rankin (Stroke Patients Only)       Balance Overall balance assessment: Needs assistance Sitting-balance support: Bilateral upper extremity supported, Feet supported Sitting balance-Leahy Scale: Poor Sitting balance - Comments: reliant on BUE support at EOB   Standing balance support: Bilateral upper extremity supported, During functional activity, Reliant on assistive device for balance Standing balance-Leahy Scale: Poor Standing balance comment: RW                            Communication Communication Communication: No apparent difficulties  Cognition Arousal: Alert Behavior During Therapy: WFL for tasks assessed/performed, Anxious   PT - Cognitive impairments: No family/caregiver present to determine baseline, Difficult to assess, Safety/Judgement                       PT - Cognition Comments: Pt perseverating on R chest pain (per chart review this has been worked up extensively overnight/in AM, RN/MD aware). Multimodal cues and increased time to initiate and perform all tasks. Following commands: Intact      Cueing Cueing Techniques: Verbal cues  Exercises      General Comments General comments (skin integrity, edema, etc.): SpO2/HR WFL, HR ~63 bpm sitting in chair post-exertion. Pt c/o msk pain and R ribcage sharp pain with deep breaths; pt bed linens wet and removed by PTA, new linens placed in room      Pertinent Vitals/Pain Pain Assessment Pain Assessment: Faces Faces Pain Scale: Hurts even more Pain Location: R chest and ribcage with AROM of RUE and pushing to sit up/using RW, etc Pain Descriptors / Indicators:  Discomfort, Grimacing, Guarding, Sharp, Sore Pain Intervention(s): Limited activity within patient's tolerance, Monitored during session, Premedicated before session, Repositioned    Home Living                          Prior Function            PT Goals (current goals can now be found in the care plan section) Acute Rehab PT Goals Patient Stated Goal: Less R chest pain and to move around better so I can go home. PT Goal Formulation: With patient Time For Goal Achievement: 03/16/24 Progress towards PT goals: Progressing toward goals    Frequency    Min 3X/week      PT Plan      Co-evaluation              AM-PAC PT 6 Clicks Mobility   Outcome Measure  Help needed turning from your back to your side while in a flat bed without using bedrails?: A Little Help needed moving from lying on your back to sitting on the side of a flat bed without using bedrails?: A Lot Help needed moving to and from a  bed to a chair (including a wheelchair)?: A Lot Help needed standing up from a chair using your arms (e.g., wheelchair or bedside chair)?: A Lot Help needed to walk in hospital room?: A Lot Help needed climbing 3-5 steps with a railing? : Total 6 Click Score: 12    End of Session Equipment Utilized During Treatment: Gait belt Activity Tolerance: Patient tolerated treatment well;Patient limited by pain Patient left: with call bell/phone within reach;Other (comment);with family/visitor present;in chair;with chair alarm set (spouse in room; pt up in recliner, RN and OT notified pt will need bed linen change and bathing due to wet bed linens/gown) Nurse Communication: Mobility status;Other (comment) (pt needs a bath) PT Visit Diagnosis: Unsteadiness on feet (R26.81);Muscle weakness (generalized) (M62.81)     Time: 8670-8593 PT Time Calculation (min) (ACUTE ONLY): 37 min  Charges:    $Gait Training: 8-22 mins $Therapeutic Activity: 8-22 mins PT General  Charges $$ ACUTE PT VISIT: 1 Visit                     Marybell Robards P., PTA Acute Rehabilitation Services Secure Chat Preferred 9a-5:30pm Office: [phone]    Connell HERO [facility] 03/03/2024, 2:26 PM

## 2024-03-03 NOTE — Progress Notes (Signed)
 Physical Therapy Treatment Patient Details Name: [name] [name] MRN: [name] DOB: [birth year] Today's Date: 03/03/2024   History of Present Illness [name] [name] is a 78 y.o. male, who presented to the emergency department yesterday with weakness in his legs. He was unable to get off of the toilet. He was unable to walk in the hospital last night. He is also been confused over the past 24 hours. 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 Sheree in 2021. Increased c/o R chest pain 7/14 PM and 7/14 CXR shows stable left basilar atelectasis, will demonstrating no evidence of interval or acute cardiopulmonary process, including no evidence of infiltrate, edema, effusion, or pneumothorax. 7/14 EKG shows sinus bradycardia with heart rate 57, normal intervals, and no evidence of T wave or ST changes, including no evidence of ST elevation. Past medical history: Chronic back pain, GERD, HLD, HTN, AAA s/p repair with endo leak, Dementia.    PT Comments  Pt received in supine, c/o R chest pain, RN/MD notified/aware. Pt defers OOB to chair as his spouse is sleeping in his recliner, but agreeable to repositioning in supine for comfort and transition to bed chair posture as his lunch tray was arrived to room but not yet set up. Pt needing up to modA for supine to long sitting but unable to self-support upright without back support due to c/o R chest pain/guarding. Pt encouraged to eat lunch if able to gain strength, pt reluctant as he already ate breakfast. Pt not yet able to perform OOB to chair or stair training to simulate home entry due to c/o R chest pain. Will return later in day if possible to work on OOB mobility progression. Patient will benefit from continued inpatient follow up therapy, <3 hours/day however per chart review, pt  and spouse prefer to return home with HHPT if possible. Pt will need to demonstrate ability to transfer, ambulate and perform stairs with assist from spouse prior to returning home, unless chair lift or ramp installed for stairs.     If plan is discharge home, recommend the following: A lot of help with bathing/dressing/bathroom;Assist for transportation;Help with stairs or ramp for entrance;Two people to help with walking and/or transfers;Assistance with cooking/housework;Direct supervision/assist for medications management;Direct supervision/assist for financial management;Supervision due to cognitive status   Can travel by private vehicle        Equipment Recommendations  BSC/3in1 (ramp or chair lift for his stairs at home; otherwise may need PTAR transport for home)    Recommendations for Other Services       Precautions / Restrictions Precautions Precautions: Fall Recall of Precautions/Restrictions: Impaired Precaution/Restrictions Comments: history of dementia, R chest pain 7/14 PM and 7/15 AM. Restrictions Weight Bearing Restrictions Per Provider Order: No     Mobility  Bed Mobility Overal bed mobility: Needs Assistance Bed Mobility: Supine to Sit     Supine to sit: Mod assist, HOB elevated, Used rails     General bed mobility comments: Increased time to complete bed mobility to long sitting x2 reps, pt defers EOB/OOB due to R chest pain, agreeable to long sitting and repositioning into bed chair posture so PTA can set his lunch tray up for him. Pt reluctant to eat due to reported late breakfast and states his wife may want his lunch. Pt encouraged to  try eating what he can by PTA.    Transfers Overall transfer level: Needs assistance                 General transfer comment: Pt defers due to R chest pain, RN/MD notified.    Ambulation/Gait                   Stairs             Wheelchair Mobility     Tilt Bed    Modified Rankin (Stroke  Patients Only)       Balance Overall balance assessment: Needs assistance Sitting-balance support: Bilateral upper extremity supported, Feet supported Sitting balance-Leahy Scale: Poor Sitting balance - Comments: BUE support needed for long sitting       Standing balance comment: pt defers at time of attempt                            Communication Communication Communication: No apparent difficulties  Cognition Arousal: Alert Behavior During Therapy: WFL for tasks assessed/performed, Anxious   PT - Cognitive impairments: No family/caregiver present to determine baseline, Difficult to assess, Safety/Judgement                       PT - Cognition Comments: Pt perseverating on R chest pain (per chart review this has been worked up extensively overnight/in AM, RN/MD aware). Multimodal cues for repositioning in supine. Following commands: Intact      Cueing Cueing Techniques: Verbal cues  Exercises      General Comments General comments (skin integrity, edema, etc.): SpO2/HR WFL on RA per pulse oximeter.      Pertinent Vitals/Pain Pain Assessment Pain Assessment: Faces Faces Pain Scale: Hurts whole lot Pain Location: R chest Pain Descriptors / Indicators: Discomfort, Grimacing, Guarding, Sharp, Sore Pain Intervention(s): Limited activity within patient's tolerance, Monitored during session, Repositioned, Patient requesting pain meds-RN notified    Home Living                          Prior Function            PT Goals (current goals can now be found in the care plan section) Acute Rehab PT Goals Patient Stated Goal: Less R chest pain and to move around better so I can go home. PT Goal Formulation: With patient Time For Goal Achievement: 03/16/24 Progress towards PT goals: Progressing toward goals    Frequency    Min 3X/week      PT Plan      Co-evaluation              AM-PAC PT 6 Clicks Mobility   Outcome  Measure  Help needed turning from your back to your side while in a flat bed without using bedrails?: A Little Help needed moving from lying on your back to sitting on the side of a flat bed without using bedrails?: A Lot Help needed moving to and from a bed to a chair (including a wheelchair)?: A Lot Help needed standing up from a chair using your arms (e.g., wheelchair or bedside chair)?: A Lot Help needed to walk in hospital room?: Total Help needed climbing 3-5 steps with a railing? : Total 6 Click Score: 11    End of Session   Activity Tolerance: Patient limited by pain Patient left: with call bell/phone within reach;in bed;with bed alarm set;Other (  comment);with family/visitor present (bed placed in chair posture, pillows under BUE for support, lunch tray set up in front of him. Spouse present but sleeping in his recliner chair.) Nurse Communication: Mobility status;Patient requests pain meds PT Visit Diagnosis: Unsteadiness on feet (R26.81);Muscle weakness (generalized) (M62.81)     Time: 8843-8795 PT Time Calculation (min) (ACUTE ONLY): 8 min  Charges:    $Therapeutic Activity: 8-22 mins PT General Charges $$ ACUTE PT VISIT: 1 Visit                     Melodie Ashworth P., PTA Acute Rehabilitation Services Secure Chat Preferred 9a-5:30pm Office: [phone]    Connell HERO [facility] 03/03/2024, 1:18 PM

## 2024-03-03 NOTE — Progress Notes (Signed)
 PROGRESS NOTE    [name] [name]  FMW:[id number] DOB: August 17, 1946 DOA: 02/29/2024 PCP: [name], MD   Brief Narrative:  The patient is a 78 year old African-American male with a past medical history significant for but not limited to chronic back pain, GERD, hypertension, hyperlipidemia, history of AAA status post repair with endoleak, dementia and other comorbidities who presented to the ED with worsening confusion has been progressive last 24 hours associated with generalized weakness that happened hours prior to admission.  Per his wife he was unable to get up from the commode due to his significant weakness and had some difficulty following commands and was noted to be very confused so EMS was called.    Wife stated that he was not baseline so he was  admitted for further evaluation and was found to be globally weak.  Labs were done and showed that he had a mild AKI vs. CKD Stage 3a.    Given his findings on the CT of the abdomen with his AAA measuring 5.7 x 4.4 cm vascular surgery was consulted for further evaluation and since this showed progression in the infrarenal aorta and given that he is an asymptomatic diabetic.  They are recommending outpatient treatment for aneurysmal degeneration.  PT/OT now recommending SNF and patient in family are in agreement to pursue this and awaiting bed offers and Insurance Auth.   Assessment and Plan:  Acute Encephalopathy: Improved. MRI negative and cva ruled out.  -? Related uncontrolled hypertension. Neuro workup initiated and ordered Ammonia was 28, UDS Negative, vbg , TSH was 1.065. EEG was suggestive of mild diffuse encephalopathy with no seizures or epileptiform discharges seen throughout the recording. Mentation is much closer to his baseline per wife and essentially resolved.   Global weakness in setting of chronic debility:  Uses walker at baseline  -Due to acute medical illness nos ? Viral prodrome/AKI. Total CK neg  -Held statin for now.  RVP Negative. Inflammatory markers elevated as CRP is 22.0 and ESR is 65 and ? Related to AAA. Will repeat Inflammatory Markers in the AM - PT/OT evaluating and he remains extremely weak and now they are reommending SNF. TOC consulted for assistance with placement as patient and wife are agreeable   Essential Hypertension uncontrolled: resume home regimen of Carvedilol  25 mg po BID, Amlodipine  2.5 mg po Daily, and Hydralazine  10 mg q6h. Continue to monitor blood pressures per protocol.  Continue with prn labetalol . Last BP reading was 155/90   AKI vs CKD Stage 3a: BUN/Cr went from 15/1.47 -> 13/1.43 -> 10/1.34. IVF with NS @ 75 mL/hr now stopped. Avoid Nephrotoxic Medications, Contrast Dyes, Hypotension and Dehydration to Ensure Adequate Renal Perfusion and will need to Renally Adjust Meds -Continue to Monitor and Trend Renal Function carefully and repeat CMP in the AM   Right Sided Chest Discomfort: worked up by [employer] Physician; see Dr. Lesli detailed note. Resolved and did not have any during the daytime shift   Hypokalemia: Mild and Improved. 3.3 -> 3.8. Replete. CTM and repelte as Necessary.     Abn CE : due to demand  in setting of uncontrolled blood pressure. Noted no significant delta. Continued to monitor ce over night to be complete, echo ordered and pending   AAA with Endo Leak/Aortic Dissection: CT noted enlarging AAA. Vascular consulted and noted no acute intervention currently But is a candidate for intervention regarding enlarging AAA. Vascular Recommending F/U in the outpt setting 4-6 weeks to discuss Endovascular Repair possibly requiring Tambe  versus modified Cook graft   GERD/GI Prophylaxis: C/w PPI Pantoprazole     Chronic Back Pain: No acute issue.C/w supportive care and pain regimen   HLD: Holding statin for now given weakness  Thrombocytopenia: Plt Count went from 135 -> 141 on the last check. CTM and Trend and repaet CMP in the AM  Hypoalbuminemia:  Patient's Albumin Lvl went from 3.5 -> 2.7 on the last check. CTM and Trend and Repeat CMP in the AM   Overweight: Complicates overall prognosis and care. Estimated body mass index is 29.99 kg/m as calculated from the following:   Height as of this encounter: 5' 10.98 (1.803 m).   Weight as of this encounter: 97.5 kg. Weight Loss and Dietary Counseling given   DVT prophylaxis: heparin  injection 5,000 Units Start: 03/01/24 0600    Code Status: Full Code Family Communication: D/w wife @ bedside  Disposition Plan:  Level of care: Telemetry Medical Status is: Inpatient Remains inpatient appropriate because: Needs SNF and insurance auth and bed offers   Consultants:  Vascular Surgery  Procedures:  As delineated as above  Antimicrobials:  Anti-infectives (From admission, onward)    None       Subjective: Seen and examined at bedside he is resting.  Essentially at his baseline from a cognitive standpoint however continues to be globally weak.  Family at bedside and discussed with him about his weakness and PT recommendations and they are agreeable to SNF.  He denies any nausea, vomiting or chest pain or discomfort.  No other concerns or complaints this time  Objective: Vitals:   03/03/24 0158 03/03/24 0517 03/03/24 1549 03/03/24 1953  BP: (!) 181/91 (!) 142/80 126/81 (!) 155/90  Pulse: 69 61 64 79  Resp: 18 18 18 18   Temp: 98.1 F (36.7 C) 98 F (36.7 C) 98.6 F (37 C) 98.7 F (37.1 C)  TempSrc: Oral  Oral Oral  SpO2: 96% 100% 98% 97%  Weight:      Height:        Intake/Output Summary (Last 24 hours) at 03/03/2024 2006 Last data filed at 03/03/2024 9482 Gross per 24 hour  Intake 619.95 ml  Output 0 ml  Net 619.95 ml   Filed Weights   02/29/24 1754 03/01/24 1144  Weight: 97.5 kg 97.5 kg   Examination: Physical Exam:  Constitutional: Overweight chronically ill-appearing African-American male in no acute distress Respiratory: Diminished to auscultation  bilaterally, no wheezing, rales, rhonchi or crackles. Normal respiratory effort and patient is not tachypenic. No accessory muscle use.  Unlabored breathing Cardiovascular: RRR, no murmurs / rubs / gallops. S1 and S2 auscultated. No extremity edema. Abdomen: Soft, non-tender, stented secondary to body habitus bowel sounds positive.  GU: Deferred. Musculoskeletal: No clubbing / cyanosis of digits/nails. No joint deformity upper and lower extremities.  Skin: No rashes, lesions, ulcers on limited skin evaluation. No induration; Warm and dry.  Neurologic: Legally blind but cranial nerves II to XII grossly intact Psychiatric: Normal judgment and insight. Alert and oriented x 2   Data Reviewed: I have personally reviewed following labs and imaging studies  CBC: Recent Labs  Lab 02/29/24 1903 03/01/24 0620 03/01/24 0630 03/02/24 0801  WBC 10.5 9.6  --  6.8  NEUTROABS  --   --   --  4.8  HGB 13.6 11.8* 12.9* 11.6*  HCT 41.9 37.0* 38.0* 35.7*  MCV 88.2 90.0  --  88.1  PLT 119* 135*  --  141*   Basic Metabolic Panel: Recent Labs  Lab 02/29/24  1903 03/01/24 0620 03/01/24 0630 03/02/24 0801  NA 140 141 141 140  K 3.6 3.3* 3.3* 3.8  CL 102 106  --  104  CO2 24 22  --  25  GLUCOSE 156* 154*  --  126*  BUN 15 13  --  10  CREATININE 1.47* 1.43*  --  1.34*  CALCIUM  9.3 9.0  --  8.8*  MG  --   --   --  2.5*  PHOS  --   --   --  3.1   GFR: Estimated Creatinine Clearance: 53.2 mL/min (A) (by C-G formula based on SCr of 1.34 mg/dL (H)). Liver Function Tests: Recent Labs  Lab 02/29/24 1903 03/02/24 0801  AST 17 18  ALT 13 14  ALKPHOS 63 56  BILITOT 1.8* 1.3*  PROT 7.8 6.4*  ALBUMIN 3.5 2.7*   No results for input(s): LIPASE, AMYLASE in the last 168 hours. Recent Labs  Lab 03/01/24 0620  AMMONIA 28   Coagulation Profile: Recent Labs  Lab 02/29/24 1903  INR 1.2   Cardiac Enzymes: Recent Labs  Lab 02/29/24 1904  CKTOTAL 241   BNP (last 3 results) No results for  input(s): PROBNP in the last 8760 hours. HbA1C: Recent Labs    03/01/24 0620  HGBA1C 6.9*   CBG: Recent Labs  Lab 02/29/24 1904 03/01/24 1245  GLUCAP 147* 170*   Lipid Profile: No results for input(s): CHOL, HDL, LDLCALC, TRIG, CHOLHDL, LDLDIRECT in the last 72 hours. Thyroid  Function Tests: Recent Labs    03/01/24 0900  TSH 1.065   Anemia Panel: No results for input(s): VITAMINB12, FOLATE, FERRITIN, TIBC, IRON, RETICCTPCT in the last 72 hours. Sepsis Labs: Recent Labs  Lab 03/01/24 0620 03/01/24 0848  PROCALCITON <0.10  --   LATICACIDVEN 1.3 1.3   Recent Results (from the past 240 hours)  Respiratory (~20 pathogens) panel by PCR     Status: None   Collection Time: 03/01/24  5:52 AM   Specimen: Nasopharyngeal Swab; Respiratory  Result Value Ref Range Status   Adenovirus NOT DETECTED NOT DETECTED Final   Coronavirus 229E NOT DETECTED NOT DETECTED Final    Comment: (NOTE) The Coronavirus on the Respiratory Panel, DOES NOT test for the novel  Coronavirus (2019 nCoV)    Coronavirus HKU1 NOT DETECTED NOT DETECTED Final   Coronavirus NL63 NOT DETECTED NOT DETECTED Final   Coronavirus OC43 NOT DETECTED NOT DETECTED Final   Metapneumovirus NOT DETECTED NOT DETECTED Final   Rhinovirus / Enterovirus NOT DETECTED NOT DETECTED Final   Influenza A NOT DETECTED NOT DETECTED Final   Influenza B NOT DETECTED NOT DETECTED Final   Parainfluenza Virus 1 NOT DETECTED NOT DETECTED Final   Parainfluenza Virus 2 NOT DETECTED NOT DETECTED Final   Parainfluenza Virus 3 NOT DETECTED NOT DETECTED Final   Parainfluenza Virus 4 NOT DETECTED NOT DETECTED Final   Respiratory Syncytial Virus NOT DETECTED NOT DETECTED Final   Bordetella pertussis NOT DETECTED NOT DETECTED Final   Bordetella Parapertussis NOT DETECTED NOT DETECTED Final   Chlamydophila pneumoniae NOT DETECTED NOT DETECTED Final   Mycoplasma pneumoniae NOT DETECTED NOT DETECTED Final    Comment:  Performed at [facility], [address], [place], [place] [address]  Resp panel by RT-PCR (RSV, Flu A&B, Covid) Anterior Nasal Swab     Status: None   Collection Time: 03/01/24  8:07 PM   Specimen: Anterior Nasal Swab  Result Value Ref Range Status   SARS Coronavirus 2 by RT  PCR NEGATIVE NEGATIVE Final   Influenza A by PCR NEGATIVE NEGATIVE Final   Influenza B by PCR NEGATIVE NEGATIVE Final    Comment: (NOTE) The Xpert Xpress SARS-CoV-2/FLU/RSV plus assay is intended as an aid in the diagnosis of influenza from Nasopharyngeal swab specimens and should not be used as a sole basis for treatment. Nasal washings and aspirates are unacceptable for Xpert Xpress SARS-CoV-2/FLU/RSV testing.  Fact Sheet for Patients: BloggerCourse.com  Fact Sheet for Healthcare Providers: SeriousBroker.it  This test is not yet approved or cleared by the [place]  FDA and has been authorized for detection and/or diagnosis of SARS-CoV-2 by FDA under an Emergency Use Authorization (EUA). This EUA will remain in effect (meaning this test can be used) for the duration of the COVID-19 declaration under Section 564(b)(1) of the Act, 21 U.S.C. section 360bbb-3(b)(1), unless the authorization is terminated or revoked.     Resp Syncytial Virus by PCR NEGATIVE NEGATIVE Final    Comment: (NOTE) Fact Sheet for Patients: BloggerCourse.com  Fact Sheet for Healthcare Providers: SeriousBroker.it  This test is not yet approved or cleared by the [place]  FDA and has been authorized for detection and/or diagnosis of SARS-CoV-2 by FDA under an Emergency Use Authorization (EUA). This EUA will remain in effect (meaning this test can be used) for the duration of the COVID-19 declaration under Section 564(b)(1) of the Act, 21 U.S.C. section 360bbb-3(b)(1), unless the authorization is terminated  or revoked.  Performed at [facility], [address] [place], [place] [address]     Radiology Studies: DG Chest Port 1 View Result Date: 03/03/2024 CLINICAL DATA:  Right-sided chest pain EXAM: PORTABLE CHEST 1 VIEW COMPARISON:  03/01/2024 FINDINGS: Cardiac shadow is enlarged but stable. Tortuous thoracic aorta is noted with prior stent graft therapy. Left basilar atelectasis is again noted. No other focal abnormality is noted. IMPRESSION: Stable left basilar atelectasis. Electronically Signed   By: Oneil Devonshire M.D.   On: 03/03/2024 02:55   ECHOCARDIOGRAM COMPLETE Result Date: 03/02/2024    ECHOCARDIOGRAM REPORT   Patient Name:   [name] [name] Date of Exam: 03/02/2024 Medical Rec #:  [id number]      Height:       71.0 in Accession #:    [id number]     Weight:       214.9 lb Date of Birth:  09/01/1945      BSA:          2.174 m Patient Age:    78 years       BP:           166/95 mmHg Patient Gender: M              HR:           50 bpm. Exam Location:  Inpatient Procedure: 2D Echo, Color Doppler and Cardiac Doppler (Both Spectral and Color            Flow Doppler were utilized during procedure). Indications:    Elevated troponin  History:        Patient has prior history of Echocardiogram examinations, most                 recent 10/09/2019. CVA.  Sonographer:    Benard Stallion Referring Phys: [id number] SARA-MAIZ A THOMAS IMPRESSIONS  1. Left ventricular ejection fraction, by estimation, is 60 to 65%. The left ventricle has normal function. The left ventricle has no regional wall motion abnormalities. Left ventricular diastolic parameters were normal.  2. Right ventricular systolic function is normal. The right ventricular size is normal. There is normal pulmonary artery systolic pressure. The estimated right ventricular systolic pressure is 30.7 mmHg.  3. The mitral valve is degenerative. Mild mitral valve regurgitation. No evidence of mitral stenosis.  4. The aortic valve is tricuspid. Aortic  valve regurgitation is mild to moderate. No aortic stenosis is present.  5. 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 Aortic dilatation noted. Aneurysm of the ascending aorta, measuring 50 mm. There is dilatation of the aortic arch, measuring 38 mm.  6. The inferior vena cava is dilated in size with >50% respiratory variability, suggesting right atrial pressure of 8 mmHg. Conclusion(s)/Recommendation(s): Recommend CTA or MRA of the chest, abdomen, pelvis, to further evaluate the aorta. Abdominal aorta duplex to evaluate for abdominal aortic aneurysm. Clinical correlation required. Results conveyed to Dr. Alejandro Lazarus Marker via secure chat. FINDINGS  Left Ventricle: Left ventricular ejection fraction, by estimation, is 60 to 65%. The left ventricle has normal function. The left ventricle has no regional wall motion abnormalities. There is no left ventricular hypertrophy of the basal-septal segment. Left ventricular diastolic parameters were normal. Right Ventricle: The right ventricular size is normal. No increase in right ventricular wall thickness. Right ventricular systolic function is normal. There is normal pulmonary artery systolic pressure. The tricuspid regurgitant velocity is 2.38 m/s, and  with an assumed right atrial pressure of 8 mmHg, the estimated right ventricular systolic pressure is 30.7 mmHg. Left Atrium: Left atrial size was normal in size. Right Atrium: Right atrial size was normal in size. Pericardium: Trivial pericardial effusion is present. Mitral Valve: The mitral valve is degenerative in appearance. Mild mitral valve regurgitation. No evidence of mitral valve stenosis. Tricuspid Valve: The tricuspid valve is grossly normal. Tricuspid valve regurgitation is trivial. No evidence of tricuspid stenosis. Aortic Valve: The aortic valve is tricuspid. Aortic valve regurgitation is mild to moderate. No aortic stenosis is present. Aortic valve mean gradient measures 4.5 mmHg.  Aortic valve peak gradient measures 8.4 mmHg. Aortic valve area, by VTI measures 3.21 cm. Pulmonic Valve: The pulmonic valve was normal in structure. Pulmonic valve regurgitation is trivial. No evidence of pulmonic stenosis. Aorta: Aneurysmal dilatation of the abdominal aorta, 31mm (limited views). The aortic root is normal in size and structure and aortic dilatation noted. There is dilatation of the aortic arch, measuring 38 mm. There is an aneurysm involving the ascending aorta measuring 50 mm. Venous: The inferior vena cava is dilated in size with greater than 50% respiratory variability, suggesting right atrial pressure of 8 mmHg. IAS/Shunts: The atrial septum is grossly normal.  LEFT VENTRICLE PLAX 2D LVIDd:         5.10 cm   Diastology LVIDs:         3.20 cm   LV e' medial:    8.38 cm/s LV PW:         1.20 cm   LV E/e' medial:  9.4 LV IVS:        1.20 cm   LV e' lateral:   11.90 cm/s LVOT diam:     2.20 cm   LV E/e' lateral: 6.6 LV SV:         103 LV SV Index:   47 LVOT Area:     3.80 cm  RIGHT VENTRICLE RV Basal diam:  3.00 cm RV Mid diam:    2.70 cm RV S prime:     15.00 cm/s TAPSE (M-mode): 2.3 cm LEFT ATRIUM  Index        RIGHT ATRIUM           Index LA diam:      2.90 cm 1.33 cm/m   RA Area:     16.10 cm LA Vol (A2C): 81.0 ml 37.26 ml/m  RA Volume:   31.80 ml  14.63 ml/m LA Vol (A4C): 55.2 ml 25.39 ml/m  AORTIC VALVE AV Area (Vmax):    3.22 cm AV Area (Vmean):   3.21 cm AV Area (VTI):     3.21 cm AV Vmax:           145.00 cm/s AV Vmean:          96.250 cm/s AV VTI:            0.320 m AV Peak Grad:      8.4 mmHg AV Mean Grad:      4.5 mmHg LVOT Vmax:         123.00 cm/s LVOT Vmean:        81.200 cm/s LVOT VTI:          0.270 m LVOT/AV VTI ratio: 0.84  AORTA Ao Root diam: 3.70 cm Ao Asc diam:  5.00 cm MITRAL VALVE               TRICUSPID VALVE MV Area (PHT): 3.27 cm    TR Peak grad:   22.7 mmHg MV Decel Time: 232 msec    TR Vmax:        238.00 cm/s MV E velocity: 79.10 cm/s MV A  velocity: 85.40 cm/s  SHUNTS MV E/A ratio:  0.93        Systemic VTI:  0.27 m                            Systemic Diam: 2.20 cm Sunit Tolia Electronically signed by Madonna Large Signature Date/Time: 03/02/2024/1:09:37 PM    Final    Scheduled Meds:  amLODipine   2.5 mg Oral Daily   aspirin  EC  81 mg Oral Daily   carvedilol   25 mg Oral BID WC   heparin   5,000 Units Subcutaneous Q8H   hydrALAZINE   10 mg Oral Q6H   pantoprazole   40 mg Oral Daily   rosuvastatin   10 mg Oral Daily   Continuous Infusions:   LOS: 2 days   Alejandro Marker, DO [facility] Available via Epic secure chat 7am-7pm After these hours, please refer to coverage provider listed on [email or web address] 03/03/2024, 8:06 PM

## 2024-03-03 NOTE — Progress Notes (Addendum)
 [facility] night cross cover note:   I was notified by the patient's RN that this patient, who is hospitalized with acute encephalopathy, is complaining of some right-sided chest discomfort, that is reported to be intermittent in nature, worsening with certain movements, but non-exertional. Pain is limited to the right side of the chest.  No report of any corresponding substernal or left-sided chest discomfort nor any reported radiation to the back, nor any corresponding abdominal discomfort.   Most recent vital signs were notable for afebrile, heart rate in the high 60s, blood pressure 181/91, respiratory rate 18, and oxygen saturation in the mid to high 90s on room air.  RN conveys that the patient received a scheduled dose of oral hydralazine  10 mg around 3 hours ago, at 2314 on 7/14.  He has also received his evening Coreg .   No current orders for as needed analgesics.  Per chart review, patient with a history of AAA, and was evaluated by Dr.Cain of vascular surgery on the morning of 03/02/2024. Vascular surgery felt no indication for urgent surgical intervention, but rather recommended outpatient follow-up in the vascular surgery clinic in 4 to 6 weeks.  Description of the above chest discomfort appears less suggestive of ACS or aortic dissection.  At this time, I have added order for prn acetaminophen  for pain as well as as needed IV hydralazine  for systolic blood pressure greater than 160 mmHg. I've also ordered stat chest x-ray and EKG.    Update: Chest x-ray, in comparison to most recent prior 1 view chest x-ray from 03/01/2024, shows stable left basilar atelectasis, will demonstrating no evidence of interval or acute cardiopulmonary process, including no evidence of infiltrate, edema, effusion, or pneumothorax.  EKG shows sinus bradycardia with heart rate 57, normal intervals, and no evidence of T wave or ST changes, including no evidence of ST elevation.    Eva Pore,  DO Hospitalist

## 2024-03-03 NOTE — Plan of Care (Signed)

## 2024-03-03 NOTE — [place] FL2 (Signed)
 [place]  MEDICAID FL2 LEVEL OF CARE FORM     IDENTIFICATION  Patient Name: [name] [name] Birthdate: 1945/12/25 Sex: male Admission Date (Current Location): 02/29/2024  [facility] and [place] Number:  Producer, television/film/video and Address:  [facility] [place], [address] [place], [place] [address]      Provider Number: [id number]  Attending Physician Name and Address:  Sherrill [name] Donovan, DO  Relative Name and Phone Number:       Current Level of Care: Hospital Recommended Level of Care: Skilled Nursing Facility Prior Approval Number:    Date Approved/Denied:   PASRR Number: [id number] [name]  Discharge Plan: SNF    Current Diagnoses: Patient Active Problem List   Diagnosis Date Noted   Acute encephalopathy 03/01/2024   Allergic rhinitis 09/21/2020   Benign prostatic hyperplasia 09/21/2020   Body mass index (BMI) 31.0-31.9, adult 09/21/2020   Cerebral infarction (HCC) 09/21/2020   Diabetic foot (HCC) 09/21/2020   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   Dysarthria 09/21/2020   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   Gait difficulty 09/21/2020   History of psychiatric disorder 09/21/2020   History of colonic polyps 09/21/2020   Pure hypercholesterolemia 09/21/2020   Unspecified thoracic, thoracolumbar and lumbosacral intervertebral disc disorder 09/21/2020   Hypertension    Hyperlipidemia    GERD (gastroesophageal reflux disease)    DM (diabetes mellitus) (HCC)    CKD (chronic kidney disease), stage III (HCC)    Normocytic anemia    Thrombocytopenia (HCC)    UTI (urinary tract infection)    AMS (altered mental status)    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   History of repair of dissecting thoracic aneurysm    Abdominal distension    Pulmonary embolus (HCC) 10/19/2019   CVA (cerebral vascular accident) (HCC) 10/19/2019   AKI (acute kidney injury) (HCC) 10/19/2019   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) 10/19/2019   Ileus (HCC) 10/19/2019   Dissection of thoracoabdominal aorta (HCC)    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   Radiculopathy 12/15/2014    Orientation RESPIRATION BLADDER Height & Weight     Self, Situation, Place  Normal Incontinent, External catheter Weight: 214 lb 15.2 oz (97.5 kg) Height:  5' 10.98 (180.3 cm)  BEHAVIORAL SYMPTOMS/MOOD NEUROLOGICAL BOWEL NUTRITION STATUS      Continent Diet (See dc summary)  AMBULATORY STATUS COMMUNICATION OF NEEDS Skin   Limited Assist Verbally Normal                       Personal Care Assistance Level of Assistance  Bathing, Feeding, Dressing Bathing Assistance: Limited assistance Feeding assistance: Independent Dressing Assistance: Limited assistance     Functional Limitations Info             SPECIAL CARE FACTORS FREQUENCY  PT (By licensed PT), OT (By licensed OT)     PT Frequency: 5x/week OT Frequency: 5x/week            Contractures Contractures Info: Not present    Additional Factors Info  Code Status, Allergies Code Status Info: Full Allergies Info: Lyrica (Pregabalin), Sulfa Antibiotics           Current Medications (03/03/2024):  This is the current hospital active medication list Current Facility-Administered Medications  Medication Dose Route Frequency Provider Last Rate Last Admin   acetaminophen  (TYLENOL ) tablet 650 mg  650 mg Oral Q6H PRN  [name], [name] B, DO   650 mg at 03/03/24 0920   amLODipine  (NORVASC ) tablet 2.5 mg  2.5 mg Oral Daily [name] Hitch A, MD   2.5 mg at 03/03/24 0917   aspirin  EC tablet 81 mg  81 mg Oral Daily [name], [name] A, MD   81 mg at 03/03/24 9082   carvedilol  (COREG ) tablet 25 mg  25 mg Oral BID WC [name], [name] A, MD   25 mg at 03/03/24 9082   heparin  injection 5,000 Units  5,000 Units Subcutaneous Q8H [name] Hitch A, MD   5,000 Units at 03/03/24  0612   hydrALAZINE  (APRESOLINE ) injection 10 mg  10 mg Intravenous Q4H PRN [name], [name] B, DO   10 mg at 03/03/24 9780   hydrALAZINE  (APRESOLINE ) tablet 10 mg  10 mg Oral Q6H [name] Hitch A, MD   10 mg at 03/03/24 1228   pantoprazole  (PROTONIX ) EC tablet 40 mg  40 mg Oral Daily [name], [name] A, MD   40 mg at 03/03/24 9082   rosuvastatin  (CRESTOR ) tablet 10 mg  10 mg Oral Daily [name], [name] A, MD   10 mg at 03/03/24 9082     Discharge Medications: Please see discharge summary for [name] list of discharge medications.  Relevant Imaging Results:  Relevant Lab Results:   Additional Information SSN  [id number]  [name] GORMAN Kindle, LCSW

## 2024-03-04 DIAGNOSIS — G9340 Encephalopathy, unspecified: Secondary | ICD-10-CM | POA: Diagnosis not present

## 2024-03-04 LAB — CBC WITH DIFFERENTIAL/PLATELET
Abs Immature Granulocytes: 0.03 K/uL (ref 0.00–0.07)
Basophils Absolute: 0 K/uL (ref 0.0–0.1)
Basophils Relative: 1 %
Eosinophils Absolute: 0.2 K/uL (ref 0.0–0.5)
Eosinophils Relative: 2 %
HCT: 37.2 % — ABNORMAL LOW (ref 39.0–52.0)
Hemoglobin: 12.3 g/dL — ABNORMAL LOW (ref 13.0–17.0)
Immature Granulocytes: 0 %
Lymphocytes Relative: 13 %
Lymphs Abs: 1 K/uL (ref 0.7–4.0)
MCH: 28.8 pg (ref 26.0–34.0)
MCHC: 33.1 g/dL (ref 30.0–36.0)
MCV: 87.1 fL (ref 80.0–100.0)
Monocytes Absolute: 1 K/uL (ref 0.1–1.0)
Monocytes Relative: 13 %
Neutro Abs: 5.4 K/uL (ref 1.7–7.7)
Neutrophils Relative %: 71 %
Platelets: 211 K/uL (ref 150–400)
RBC: 4.27 MIL/uL (ref 4.22–5.81)
RDW: 14.6 % (ref 11.5–15.5)
WBC: 7.6 K/uL (ref 4.0–10.5)
nRBC: 0 % (ref 0.0–0.2)

## 2024-03-04 LAB — COMPREHENSIVE METABOLIC PANEL WITH GFR
ALT: 18 U/L (ref 0–44)
AST: 16 U/L (ref 15–41)
Albumin: 2.7 g/dL — ABNORMAL LOW (ref 3.5–5.0)
Alkaline Phosphatase: 58 U/L (ref 38–126)
Anion gap: 15 (ref 5–15)
BUN: 11 mg/dL (ref 8–23)
CO2: 21 mmol/L — ABNORMAL LOW (ref 22–32)
Calcium: 9.1 mg/dL (ref 8.9–10.3)
Chloride: 103 mmol/L (ref 98–111)
Creatinine, Ser: 1.11 mg/dL (ref 0.61–1.24)
GFR, Estimated: >60 mL/min (ref >60)
Glucose, Bld: 131 mg/dL — ABNORMAL HIGH (ref 70–99)
Potassium: 3.5 mmol/L (ref 3.5–5.1)
Sodium: 139 mmol/L (ref 135–145)
Total Bilirubin: 1 mg/dL (ref 0.0–1.2)
Total Protein: 7.1 g/dL (ref 6.5–8.1)

## 2024-03-04 LAB — PHOSPHORUS: Phosphorus: 3 mg/dL (ref 2.5–4.6)

## 2024-03-04 LAB — C-REACTIVE PROTEIN: CRP: 16.3 mg/dL — ABNORMAL HIGH (ref <1.0)

## 2024-03-04 LAB — SEDIMENTATION RATE: Sed Rate: 67 mm/h — ABNORMAL HIGH (ref 0–16)

## 2024-03-04 LAB — MAGNESIUM: Magnesium: 2.1 mg/dL (ref 1.7–2.4)

## 2024-03-04 MED ORDER — HYDRALAZINE HCL 10 MG PO TABS: 10.0000 mg | ORAL_TABLET | Freq: Four times a day (QID) | ORAL | 0 refills | Status: DC

## 2024-03-04 MED ORDER — AMLODIPINE BESYLATE 10 MG PO TABS
10.0000 mg | ORAL_TABLET | Freq: Every day | ORAL | Status: DC
  Administered 2024-03-04 – 2024-03-05 (×2): 10 mg via ORAL
  Filled 2024-03-04 (×2): qty 1

## 2024-03-04 MED ORDER — HYDRALAZINE HCL 10 MG PO TABS: 10.0000 mg | ORAL_TABLET | Freq: Three times a day (TID) | ORAL | 0 refills | Status: DC

## 2024-03-04 MED ORDER — AMLODIPINE BESYLATE 5 MG PO TABS: 5.0000 mg | ORAL_TABLET | Freq: Every day | ORAL | Status: DC

## 2024-03-04 MED ORDER — PANTOPRAZOLE SODIUM 40 MG PO TBEC: 40.0000 mg | DELAYED_RELEASE_TABLET | Freq: Every day | ORAL | 0 refills | Status: DC

## 2024-03-04 NOTE — TOC Progression Note (Signed)
 Transition of Care [facility]) - Progression Note    Patient Details  Name: [name] [name] MRN: [id number] Date of Birth: [birth year]  [facility] of [facility]) CM/SW Contact  [name], [place] Phone Number: 03/04/2024, 3:25 PM  Clinical Narrative:     Called patient's spouse - unable to reach by phone   Montie Louder, MSW, LCSW Clinical Social Worker    Expected Discharge Plan: Skilled Nursing Facility Barriers to Discharge: Continued Medical Work up, English as a second language teacher, SNF Pending bed offer  Expected Discharge Plan and Services In-house Referral: Clinical Social Work   Post Acute Care Choice: Skilled Nursing Facility Living arrangements for the past 2 months: Single Family Home Expected Discharge Date: 03/04/24                                     Social Determinants of Health (SDOH) Interventions SDOH Screenings   Food Insecurity: No Food Insecurity (03/01/2024)  Housing: Low Risk  (03/01/2024)  Transportation Needs: No Transportation Needs (03/01/2024)  Utilities: Not At Risk (03/01/2024)  Depression (PHQ2-9): Medium Risk (11/14/2020)  Social Connections: Moderately Integrated (03/01/2024)  Tobacco Use: Medium Risk (03/01/2024)    Readmission Risk Interventions     No data to display

## 2024-03-04 NOTE — Plan of Care (Signed)

## 2024-03-04 NOTE — Discharge Summary (Addendum)
 Physician Discharge Summary   Patient: [name] [name] MRN: [id number] DOB: 02/25/1946  Admit date:     02/29/2024  Discharge date: 03/04/24  Discharge Physician: Adriana DELENA Grams   PCP: [name], MD   Recommendations at discharge:  - Follow-up with PCP in 1-2 weeks -follow-up with vascular surgery 1-2 weeks - Recommending consultation of following palliative care for outpatient   Discharge Diagnoses: Principal Problem:   Acute encephalopathy  Resolved Problems:   * No resolved hospital problems. [facility] Course: The patient is a 78 year old African-American male with a past medical history significant for but not limited to chronic back pain, GERD, hypertension, hyperlipidemia, history of AAA status post repair with endoleak, dementia and other comorbidities who presented to the ED with worsening confusion has been progressive last 24 hours associated with generalized weakness that happened hours prior to admission.  Per his wife he was unable to get up from the commode due to his significant weakness and had some difficulty following commands and was noted to be very confused so EMS was called.    Wife stated that he was not baseline so he was  admitted for further evaluation and was found to be globally weak.  Labs were done and showed that he had a mild AKI vs. CKD Stage 3a.    Given his findings on the CT of the abdomen with his AAA measuring 5.7 x 4.4 cm vascular surgery was consulted for further evaluation and since this showed progression in the infrarenal aorta and given that he is an asymptomatic diabetic.  They are recommending outpatient treatment for aneurysmal degeneration.  PT/OT now recommending SNF and patient in family are in agreement to pursue this and awaiting bed offers and Insurance Auth.    Acute Encephalopathy: Improved. MRI negative and cva ruled out.  -? Related uncontrolled hypertension. Neuro workup initiated and ordered Ammonia was 28, UDS  Negative, vbg , TSH was 1.065. EEG was suggestive of mild diffuse encephalopathy with no seizures or epileptiform discharges seen throughout the recording. Mentation is much closer to his baseline per wife and essentially resolved.   Global weakness in setting of chronic debility:  Uses walker at baseline  -Due to acute medical illness nos ? Viral prodrome/AKI. Total CK neg  -Held statin for now. RVP Negative. Inflammatory markers elevated as CRP is 22.0 and ESR is 65 and ? Related to AAA. Will repeat Inflammatory Markers in the AM - PT/OT evaluating and he remains extremely weak and now they are reommending SNF. -  patient and wife are agreeable   Essential Hypertension uncontrolled:  Resume home regimen including carvedilol  25 mg po BID, Amlodipine  2.5 mg and  Hydralazine     AKI vs CKD Stage 3a: BUN/Cr went from 15/1.47 -> 13/1.43 -> 10/1.34. IVF with NS @ 75 mL/hr now stopped. Avoid Nephrotoxic Medications, Contrast Dyes, Hypotension and Dehydration t Renally Adjust Meds Lab Results  Component Value Date   CREATININE 1.11 03/04/2024   CREATININE 1.34 (H) 03/02/2024   CREATININE 1.43 (H) 03/01/2024     Right Sided Chest Discomfort: - Resolved-workup negative   Hypokalemia: Monitored and was repleted    Abn CE : due to demand  in setting of uncontrolled blood pressure. Noted no significant delta. Continued to monitor ce over night to be complete, echo ordered and pending   AAA with Endo Leak/Aortic Dissection: CT noted enlarging AAA. Vascular consulted and noted no acute intervention currently But is a candidate for intervention regarding enlarging AAA. Vascular  Recommending F/U in the outpt setting 4-6 weeks to discuss Endovascular Repair possibly requiring Tambe versus modified Cook graft   GERD/GI Prophylaxis: C/w PPI     Chronic Back Pain: No acute issue.C/w supportive care and pain regimen   HLD: statin -May hold for any further weakness   Thrombocytopenia: Plt Count went  from 135 -> 141   Hypoalbuminemia: Patient's Albumin Lvl went from 3.5 -> 2.7 on the last check.  Was replaced  Overweight: Complicates overall prognosis and care. Estimated body mass index is 29.99 kg/m as calculated from the following:   Height as of this encounter: 5' 10.98 (1.803 m).   Weight as of this encounter: 97.5 kg. Weight Loss and Dietary Counseling given     Consultants: Vascular surgery Disposition: Skilled nursing facility Diet recommendation:  Discharge Diet Orders (From admission, onward)     Start     Ordered   03/04/24 0000  Diet - low sodium heart healthy        03/04/24 1202           Cardiac and Carb modified diet DISCHARGE MEDICATION: Allergies as of 03/04/2024       Reactions   Lyrica [pregabalin] Nausea Only, Other (See Comments)   Hallucinations Dizziness    Sulfa Antibiotics Other (See Comments)   Unknown reaction        Medication List     PAUSE taking these medications    rosuvastatin  5 MG tablet Wait to take this until: March 09, 2024 Commonly known as: CRESTOR  Take 5 mg by mouth at bedtime.       STOP taking these medications    losartan 50 MG tablet Commonly known as: COZAAR       TAKE these medications    acetaminophen  500 MG tablet Commonly known as: TYLENOL  Take 1,000 mg by mouth 2 (two) times daily.   amLODipine  10 MG tablet Commonly known as: NORVASC  Take 10 mg by mouth daily.   aspirin  EC 81 MG tablet Take 81 mg by mouth daily.   carvedilol  25 MG tablet Commonly known as: COREG  Take 1 tablet (25 mg total) by mouth 2 (two) times daily with a meal.   hydrALAZINE  10 MG tablet Commonly known as: APRESOLINE  Take 1 tablet (10 mg total) by mouth 3 (three) times daily. What changed: when to take this   memantine  10 MG tablet Commonly known as: NAMENDA  TAKE 1 TABLET BY MOUTH TWICE DAILY   Mens 50+ Multivitamin Tabs Take 1 tablet by mouth daily.   metFORMIN 500 MG tablet Commonly known as:  GLUCOPHAGE Take 500 mg by mouth 2 (two) times daily.   pantoprazole  40 MG tablet Commonly known as: PROTONIX  Take 1 tablet (40 mg total) by mouth daily.   tamsulosin  0.4 MG Caps capsule Commonly known as: FLOMAX  Take 1 capsule (0.4 mg total) by mouth daily.        Follow-up Information     Serene Gaile ORN, MD In 3 days.   Specialties: Vascular Surgery, Cardiology Contact information: [address] [place] [address] [phone]         [name], MD In 1 week.   Specialty: Internal Medicine Contact information: [address] [place] [address] [phone]                Discharge Exam: Fredricka Weights   02/29/24 1754 03/01/24 1144  Weight: 97.5 kg 97.5 kg        General:  AAO x 3,  cooperative, no distress;   HEENT:  Normocephalic, PERRL, otherwise with in Normal limits   Neuro:  CNII-XII intact. , normal motor and sensation, reflexes intact   Lungs:   Clear to auscultation BL, Respirations unlabored,  No wheezes / crackles  Cardio:    S1/S2, RRR, No murmure, No Rubs or Gallops   Abdomen:  Soft, non-tender, bowel sounds active all four quadrants, no guarding or peritoneal signs.  Muscular  skeletal:  Limited exam -global generalized weaknesses - in bed, able to move all 4 extremities,   2+ pulses,  symmetric, No pitting edema  Skin:  Dry, warm to touch, negative for any Rashes,  Wounds: Please see nursing documentation          Condition at discharge: SNF  The results of significant diagnostics from this hospitalization (including imaging, microbiology, ancillary and laboratory) are listed below for reference.   Imaging Studies: DG Chest Port 1 View Result Date: 03/03/2024 CLINICAL DATA:  Right-sided chest pain EXAM: PORTABLE CHEST 1 VIEW COMPARISON:  03/01/2024 FINDINGS: Cardiac shadow is enlarged but stable. Tortuous thoracic aorta is noted with prior stent graft therapy. Left basilar atelectasis is again  noted. No other focal abnormality is noted. IMPRESSION: Stable left basilar atelectasis. Electronically Signed   By: Oneil Devonshire M.D.   On: 03/03/2024 02:55   ECHOCARDIOGRAM COMPLETE Result Date: 03/02/2024    ECHOCARDIOGRAM REPORT   Patient Name:   GENERAL WEARING Recinos Date of Exam: 03/02/2024 Medical Rec #:  [id number]      Height:       71.0 in Accession #:    [id number]     Weight:       214.9 lb Date of Birth:  [birth year]      BSA:          2.174 m Patient Age:    78 years       BP:           166/95 mmHg Patient Gender: M              HR:           50 bpm. Exam Location:  Inpatient Procedure: 2D Echo, Color Doppler and Cardiac Doppler (Both Spectral and Color            Flow Doppler were utilized during procedure). Indications:    Elevated troponin  History:        Patient has prior history of Echocardiogram examinations, most                 recent 10/09/2019. CVA.  Sonographer:    Benard Stallion Referring Phys: [id number] SARA-MAIZ A THOMAS IMPRESSIONS  1. Left ventricular ejection fraction, by estimation, is 60 to 65%. The left ventricle has normal function. The left ventricle has no regional wall motion abnormalities. Left ventricular diastolic parameters were normal.  2. Right ventricular systolic function is normal. The right ventricular size is normal. There is normal pulmonary artery systolic pressure. The estimated right ventricular systolic pressure is 30.7 mmHg.  3. The mitral valve is degenerative. Mild mitral valve regurgitation. No evidence of mitral stenosis.  4. The aortic valve is tricuspid. Aortic valve regurgitation is mild to moderate. No aortic stenosis is present.  5. 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 Aortic dilatation noted. Aneurysm of the ascending aorta, measuring 50 mm. There is dilatation of the aortic arch, measuring 38 mm.  6. The inferior vena cava is dilated in size with >50% respiratory variability, suggesting  right atrial pressure of 8 mmHg.  Conclusion(s)/Recommendation(s): Recommend CTA or MRA of the chest, abdomen, pelvis, to further evaluate the aorta. Abdominal aorta duplex to evaluate for abdominal aortic aneurysm. Clinical correlation required. Results conveyed to Dr. Alejandro Lazarus Marker via secure chat. FINDINGS  Left Ventricle: Left ventricular ejection fraction, by estimation, is 60 to 65%. The left ventricle has normal function. The left ventricle has no regional wall motion abnormalities. There is no left ventricular hypertrophy of the basal-septal segment. Left ventricular diastolic parameters were normal. Right Ventricle: The right ventricular size is normal. No increase in right ventricular wall thickness. Right ventricular systolic function is normal. There is normal pulmonary artery systolic pressure. The tricuspid regurgitant velocity is 2.38 m/s, and  with an assumed right atrial pressure of 8 mmHg, the estimated right ventricular systolic pressure is 30.7 mmHg. Left Atrium: Left atrial size was normal in size. Right Atrium: Right atrial size was normal in size. Pericardium: Trivial pericardial effusion is present. Mitral Valve: The mitral valve is degenerative in appearance. Mild mitral valve regurgitation. No evidence of mitral valve stenosis. Tricuspid Valve: The tricuspid valve is grossly normal. Tricuspid valve regurgitation is trivial. No evidence of tricuspid stenosis. Aortic Valve: The aortic valve is tricuspid. Aortic valve regurgitation is mild to moderate. No aortic stenosis is present. Aortic valve mean gradient measures 4.5 mmHg. Aortic valve peak gradient measures 8.4 mmHg. Aortic valve area, by VTI measures 3.21 cm. Pulmonic Valve: The pulmonic valve was normal in structure. Pulmonic valve regurgitation is trivial. No evidence of pulmonic stenosis. Aorta: Aneurysmal dilatation of the abdominal aorta, 31mm (limited views). The aortic root is normal in size and structure and aortic dilatation noted. There is dilatation of  the aortic arch, measuring 38 mm. There is an aneurysm involving the ascending aorta measuring 50 mm. Venous: The inferior vena cava is dilated in size with greater than 50% respiratory variability, suggesting right atrial pressure of 8 mmHg. IAS/Shunts: The atrial septum is grossly normal.  LEFT VENTRICLE PLAX 2D LVIDd:         5.10 cm   Diastology LVIDs:         3.20 cm   LV e' medial:    8.38 cm/s LV PW:         1.20 cm   LV E/e' medial:  9.4 LV IVS:        1.20 cm   LV e' lateral:   11.90 cm/s LVOT diam:     2.20 cm   LV E/e' lateral: 6.6 LV SV:         103 LV SV Index:   47 LVOT Area:     3.80 cm  RIGHT VENTRICLE RV Basal diam:  3.00 cm RV Mid diam:    2.70 cm RV S prime:     15.00 cm/s TAPSE (M-mode): 2.3 cm LEFT ATRIUM           Index        RIGHT ATRIUM           Index LA diam:      2.90 cm 1.33 cm/m   RA Area:     16.10 cm LA Vol (A2C): 81.0 ml 37.26 ml/m  RA Volume:   31.80 ml  14.63 ml/m LA Vol (A4C): 55.2 ml 25.39 ml/m  AORTIC VALVE AV Area (Vmax):    3.22 cm AV Area (Vmean):   3.21 cm AV Area (VTI):     3.21 cm AV Vmax:  145.00 cm/s AV Vmean:          96.250 cm/s AV VTI:            0.320 m AV Peak Grad:      8.4 mmHg AV Mean Grad:      4.5 mmHg LVOT Vmax:         123.00 cm/s LVOT Vmean:        81.200 cm/s LVOT VTI:          0.270 m LVOT/AV VTI ratio: 0.84  AORTA Ao Root diam: 3.70 cm Ao Asc diam:  5.00 cm MITRAL VALVE               TRICUSPID VALVE MV Area (PHT): 3.27 cm    TR Peak grad:   22.7 mmHg MV Decel Time: 232 msec    TR Vmax:        238.00 cm/s MV E velocity: 79.10 cm/s MV A velocity: 85.40 cm/s  SHUNTS MV E/A ratio:  0.93        Systemic VTI:  0.27 m                            Systemic Diam: 2.20 cm Sunit Tolia Electronically signed by Madonna Large Signature Date/Time: 03/02/2024/1:09:37 PM    Final    EEG adult Result Date: 03/01/2024 Shelton Arlin KIDD, MD     03/01/2024 10:18 AM Patient Name: Norberto Wishon Dietzman MRN: [id number] Epilepsy Attending: Arlin KIDD Shelton Referring  Physician/Provider: Debby Camila LABOR, MD Date: 03/01/2024 Duration: 22.02 mins Patient history: 78yo M with ams. EEG to evaluate for seizure Level of alertness: Awake AEDs during EEG study: None Technical aspects: This EEG study was done with scalp electrodes positioned according to the 10-20 International system of electrode placement. Electrical activity was reviewed with band pass filter of 1-70Hz , sensitivity of 7 uV/mm, display speed of 26mm/sec with a 60Hz  notched filter applied as appropriate. EEG data were recorded continuously and digitally stored.  Video monitoring was available and reviewed as appropriate. Description: The posterior dominant rhythm consists of 8 Hz activity of moderate voltage (25-35 uV) seen predominantly in posterior head regions, symmetric and reactive to eye opening and eye closing. EEG showed intermittent generalized 3 to 6 Hz theta-delta slowing. Hyperventilation and photic stimulation were not performed.   ABNORMALITY - Intermittent slow, generalized IMPRESSION: This study is suggestive of mild diffuse encephalopathy. No seizures or epileptiform discharges were seen throughout the recording. Arlin KIDD Shelton   DG CHEST PORT 1 VIEW Result Date: 03/01/2024 CLINICAL DATA:  Little status changes. EXAM: PORTABLE CHEST 1 VIEW COMPARISON:  09/19/2023 FINDINGS: The cardio pericardial silhouette is enlarged. New retrocardiac atelectasis or infiltrate noted with probable left pleural effusion. Descending thoracic aortic endograft evident. Right lung clear. No acute bony abnormality. IMPRESSION: New retrocardiac atelectasis or infiltrate with probable left pleural effusion. Electronically Signed   By: Camellia Candle M.D.   On: 03/01/2024 06:12   CT Head Wo Contrast Result Date: 02/29/2024 CLINICAL DATA:  Altered mental status. Unknown cause. Also with bilateral leg weakness and loss of bowel control. The patient has a known abdominal aortic dissection and aneurysm. Acute aortic  syndrome is suspected. EXAM: CT HEAD WITHOUT CONTRAST CT ANGIOGRAPHY CHEST, ABDOMEN AND PELVIS TECHNIQUE: Contiguous axial images were obtained from the base of the skull through the vertex without intravenous contrast. Multiplanar CT image reconstructions were also created and reviewed. Noncontrast axial CT of the chest was initially  obtained. Multidetector CT imaging through the chest, abdomen and pelvis was performed using the standard protocol during bolus administration of intravenous contrast. Multiplanar reconstructed images and MIPs were obtained and reviewed to evaluate the vascular anatomy. RADIATION DOSE REDUCTION: This exam was performed according to the departmental dose-optimization program which includes automated exposure control, adjustment of the mA and/or kV according to patient size and/or use of iterative reconstruction technique. CONTRAST:  OMNIPAQUE  IOHEXOL  350 MG/ML SOLN COMPARISON:  MRI brain from today, head CT from 05/07/2020, CT abdomen and pelvis with contrast 04/13/2021, and CTA chest, abdomen and pelvis 07/19/2020 FINDINGS: CT HEAD WITHOUT CONTRAST FINDINGS Brain: There is moderately developed cerebral atrophy with atrophic ventriculomegaly and moderate small vessel disease. Cerebellum and brainstem are unremarkable. No cortical based acute infarct, hemorrhage, mass or mass effect is seen. There is no midline shift.  Basal cisterns are clear. Vascular: No hyperdense vessels or unexpected calcification. Scattered calcification both siphons. Skull: Negative for fractures or focal lesions. Sinuses/orbits: Interval lens extractions new from 2021. Dysconjugate gaze. Otherwise negative orbits. Clear sinuses and mastoids. Other: None. CTA CHEST FINDINGS Cardiovascular: Endoluminal aortic stent graft again extends from the junction of the isthmus and descending segment ending in the hiatal segment, again with the distal tines incompletely apposed but unchanged in appearance. There is  aortic atherosclerosis. The representative aortic measurements are as follows: Valve: 2.2 cm, previously 3 cm, on 11:92; Sinuses: 4.2 cm on 11:91, previously 4.6 cm; Sinotubular junction: 3.7 cm on 11:93, previously 3.8 cm; Mid ascending segment 4.3 x 4.3 cm on 11:98 and 12:102, previously 4.6 cm; Mid arch: 3.4 cm on 11:87; Isthmus: 3.5 cm on 12:128; Mid descending aorta: 5.0 cm AP and 5.4 cm transverse on 12:143 and 11:46, previously 4.3 cm; distal descending segment: 4.0 cm on 12:131, previously 3.8 cm. There is an increased, but noted previously type 1A endoleak from the posterior aspect of the proximal sealing zone, with at least a small type 2 contribution from an intercostal artery best seen on 7: 40-42. Contrast is seen in the excluded aneurysm sac along the anteromedial aspect to the level of T7, with increased the size of the descending aorta compared to the prior study as described above. I do not see a thoracic aortic dissection. There is mild cardiomegaly, but less than in 2021, small pericardial effusion anteriorly. The pulmonary arteries are well opacified. No embolus is seen. There is IVC and hepatic vein reflux which may be seen with right heart dysfunction or tricuspid regurgitation. The pulmonary veins are nondistended. Great vessels are tortuous but clear. Mediastinum/Nodes: Low-density fluid in the superior pericardial recess. No mediastinal hematoma. No adenopathy. Lungs/Pleura: Small layering left pleural effusion. The fluid measures 25 Hounsfield units slightly above simple fluid inferiorly, only measuring 7 Hounsfield units proximally. Pleural space hemorrhage would be expected to be denser and more heterogeneous but the pleural effusion etiology is not readily apparent. The lungs are otherwise clear. Central airways are patent. Musculoskeletal: Stable T8 vertebral body sclerotic lesion probable hemangioma. No acute or significant osseous findings. Review of the MIP images confirms the above  findings. 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 There is communication between the true and false lumens at the inferior aspect. The false lumen is posterior to the right. The false lumen is patent but with increased near circumferential thrombus since the  prior studies. 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 Celiac: Arises from the true lumen. Scattered calcific plaques. No stenosis, dissection or aneurysm. SMA: Dissection flap again extends into the SMA up to the first vessel main bifurcation, the true lumen supplies about 40% of the overall vessel diameter with the false lumen up to 60%. The false lumen largely supplies the main right-sided SMA division and the true lumen supplies the remainder. Opacification of the false lumen is significantly less than of the true lumen. The false lumen was better opacified on the prior study than today. Renals: Supplied by the true lumen. Both are widely patent, small amount of negligible calcific plaque at both ostia. IMA: Patent without evidence of aneurysm, dissection, vasculitis or significant stenosis. Inflow: Patent without evidence of aneurysm, dissection, vasculitis or significant stenosis. There is moderate inflow vessel tortuosity which was seen previously, scattered calcific plaques. Veins: No obvious venous abnormality within the limitations of this arterial phase study. Review of the MIP images confirms the above findings. NON-VASCULAR Hepatobiliary: No focal liver abnormality is seen. No gallstones, gallbladder wall thickening, or biliary dilatation. Pancreas: Fatty infiltration. No mass, ductal dilatation or inflammatory change. Spleen: No abnormality. Adrenals/Urinary Tract: No  renal mass enhancement.  No adrenal mass. There are a few bilateral too small to characterize Bosniak 2 cortical cysts. No follow-up imaging is recommended. There is contrast in the collecting systems. There previously was a 2 mm nonobstructing stone in the inferior pole of the right kidney. The contrast would obscure intrarenal stones today but there is no hydronephrosis, no ureteral filling defect. The bladder is moderately impressed upon by median lobe hypertrophy of the prostate. But has a normal wall thickness. Stomach/Bowel: Negative for dilatation or wall thickening including the appendix. Uncomplicated sigmoid diverticula. Lymphatic: No adenopathy. Reproductive: Severe prostatomegaly included the median lobe, maximum diameter 7.3 cm. Moderate impression into the bladder. Other: Right inguinal hernia contains a short loop of the distal small bowel without evidence of hernia strangulation or incarceration. There is a small left inguinal fat hernia. No free fluid, free hemorrhage or free air. Musculoskeletal: Left-sided dorsal fusion construct with interbody hardware and mature fusion L4-5. No acute or significant osseous findings. Review of the MIP images confirms the above findings. IMPRESSION: 1. No acute intracranial CT findings. Stable atrophy and small-vessel changes. 2. Interval lens extractions/replacements with dysconjugate gaze. 3. Endoluminal aortic stent graft again extends from the junction of the isthmus and descending segment ending in the hiatal segment, with increased type 1A endoleak from the posterior aspect of the proximal landing zone, and at least a small type 2 contribution from an intercostal artery. Contrast is seen in the excluded aneurysm sac along the anteromedial aspect to the level of T7, and the descending aorta has increased in size from 4.3 cm in 2021 to 5.0 x 5.4 cm today. 4. Abdominal aortic dissection again extends from just below the aortic stent graft into the infrarenal  segment terminating above the aortic bifurcation. The false lumen is patent but with increased near circumferential thrombus since the prior studies. 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 Vascular surgery referral recommended unless already done. 5. Dissection flap again extends into the SMA up to the first main bifurcation, the true lumen supplies about 40% of the overall vessel diameter with the false lumen up to 60%. The false lumen largely supplies the main right-sided  SMA division and the true lumen supplies the remainder. 6. Small layering left pleural effusion with fluid density slightly above simple fluid inferiorly. Pleural space hemorrhage would be expected to be denser and more heterogeneous but the pleural effusion etiology is not readily apparent. 7. Mild cardiomegaly, but less than in 2021, with small pericardial effusion anteriorly. 8. IVC and hepatic vein reflux which may be seen with right heart dysfunction or tricuspid regurgitation. 9. Severe prostatomegaly including the median lobe, with moderate impression into the bladder. 10. Right inguinal hernia containing a short loop of the distal small bowel without evidence of hernia strangulation or incarceration. No upstream small bowel obstruction. 11. Small left inguinal fat hernia. 12. Uncomplicated diverticulosis. 13. Aortic atherosclerosis. Aortic Atherosclerosis (ICD10-I70.0). Electronically Signed   By: Francis Quam M.D.   On: 02/29/2024 23:00   CT Angio Chest/Abd/Pel for Dissection W and/or Wo Contrast Result Date: 02/29/2024 CLINICAL DATA:  Altered mental status. Unknown cause. Also with bilateral leg weakness and loss of bowel control. The patient has a known abdominal aortic dissection and aneurysm. Acute aortic syndrome is suspected. EXAM: CT HEAD WITHOUT CONTRAST CT ANGIOGRAPHY CHEST, ABDOMEN AND PELVIS TECHNIQUE: Contiguous axial images were obtained from the base of the skull through the  vertex without intravenous contrast. Multiplanar CT image reconstructions were also created and reviewed. Noncontrast axial CT of the chest was initially obtained. Multidetector CT imaging through the chest, abdomen and pelvis was performed using the standard protocol during bolus administration of intravenous contrast. Multiplanar reconstructed images and MIPs were obtained and reviewed to evaluate the vascular anatomy. RADIATION DOSE REDUCTION: This exam was performed according to the departmental dose-optimization program which includes automated exposure control, adjustment of the mA and/or kV according to patient size and/or use of iterative reconstruction technique. CONTRAST:  OMNIPAQUE  IOHEXOL  350 MG/ML SOLN COMPARISON:  MRI brain from today, head CT from 05/07/2020, CT abdomen and pelvis with contrast 04/13/2021, and CTA chest, abdomen and pelvis 07/19/2020 FINDINGS: CT HEAD WITHOUT CONTRAST FINDINGS Brain: There is moderately developed cerebral atrophy with atrophic ventriculomegaly and moderate small vessel disease. Cerebellum and brainstem are unremarkable. No cortical based acute infarct, hemorrhage, mass or mass effect is seen. There is no midline shift.  Basal cisterns are clear. Vascular: No hyperdense vessels or unexpected calcification. Scattered calcification both siphons. Skull: Negative for fractures or focal lesions. Sinuses/orbits: Interval lens extractions new from 2021. Dysconjugate gaze. Otherwise negative orbits. Clear sinuses and mastoids. Other: None. CTA CHEST FINDINGS Cardiovascular: Endoluminal aortic stent graft again extends from the junction of the isthmus and descending segment ending in the hiatal segment, again with the distal tines incompletely apposed but unchanged in appearance. There is aortic atherosclerosis. The representative aortic measurements are as follows: Valve: 2.2 cm, previously 3 cm, on 11:92; Sinuses: 4.2 cm on 11:91, previously 4.6 cm; Sinotubular  junction: 3.7 cm on 11:93, previously 3.8 cm; Mid ascending segment 4.3 x 4.3 cm on 11:98 and 12:102, previously 4.6 cm; Mid arch: 3.4 cm on 11:87; Isthmus: 3.5 cm on 12:128; Mid descending aorta: 5.0 cm AP and 5.4 cm transverse on 12:143 and 11:46, previously 4.3 cm; distal descending segment: 4.0 cm on 12:131, previously 3.8 cm. There is an increased, but noted previously type 1A endoleak from the posterior aspect of the proximal sealing zone, with at least a small type 2 contribution from an intercostal artery best seen on 7: 40-42. Contrast is seen in the excluded aneurysm sac along the anteromedial aspect to the level of T7, with increased  the size of the descending aorta compared to the prior study as described above. I do not see a thoracic aortic dissection. There is mild cardiomegaly, but less than in 2021, small pericardial effusion anteriorly. The pulmonary arteries are well opacified. No embolus is seen. There is IVC and hepatic vein reflux which may be seen with right heart dysfunction or tricuspid regurgitation. The pulmonary veins are nondistended. Great vessels are tortuous but clear. Mediastinum/Nodes: Low-density fluid in the superior pericardial recess. No mediastinal hematoma. No adenopathy. Lungs/Pleura: Small layering left pleural effusion. The fluid measures 25 Hounsfield units slightly above simple fluid inferiorly, only measuring 7 Hounsfield units proximally. Pleural space hemorrhage would be expected to be denser and more heterogeneous but the pleural effusion etiology is not readily apparent. The lungs are otherwise clear. Central airways are patent. Musculoskeletal: Stable T8 vertebral body sclerotic lesion probable hemangioma. No acute or significant osseous findings. Review of the MIP images confirms the above findings. CTA ABDOMEN AND PELVIS FINDINGS VASCULAR Aorta: Abdominal aortic dissection and atherosclerosis, present on both prior studies, dissection again extends from just below  the aortic stent graft into the infrarenal segment terminating above the aortic bifurcation. There is communication between the true and false lumens at the inferior aspect. The false lumen is posterior to the right. The false lumen is patent but with increased near circumferential thrombus since the prior studies. 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 Celiac: Arises from the true lumen. Scattered calcific plaques. No stenosis, dissection or aneurysm. SMA: Dissection flap again extends into the SMA up to the first vessel main bifurcation, the true lumen supplies about 40% of the overall vessel diameter with the false lumen up to 60%. The false lumen largely supplies the main right-sided SMA division and the true lumen supplies the remainder. Opacification of the false lumen is significantly less than of the true lumen. The false lumen was better opacified on the prior study than today. Renals: Supplied by the true lumen. Both are widely patent, small amount of negligible calcific plaque at both ostia. IMA: Patent without evidence of aneurysm, dissection, vasculitis or significant stenosis. Inflow: Patent without evidence of aneurysm, dissection, vasculitis or significant stenosis. There is moderate inflow vessel tortuosity which was seen previously, scattered calcific plaques. Veins: No obvious venous abnormality within the limitations of this arterial phase study. Review of the MIP images confirms the above findings. NON-VASCULAR Hepatobiliary: No focal liver abnormality is seen. No gallstones, gallbladder wall thickening, or biliary dilatation. Pancreas: Fatty infiltration. No mass, ductal dilatation or inflammatory change. Spleen: No abnormality. Adrenals/Urinary Tract: No renal mass enhancement.  No adrenal mass. There are a few bilateral too small to characterize Bosniak 2 cortical cysts. No follow-up imaging is recommended. There is contrast in the  collecting systems. There previously was a 2 mm nonobstructing stone in the inferior pole of the right kidney. The contrast would obscure intrarenal stones today but there is no hydronephrosis, no ureteral filling defect. The bladder is moderately impressed upon by median lobe hypertrophy of the prostate. But has a normal wall thickness. Stomach/Bowel: Negative for dilatation or wall thickening including the appendix. Uncomplicated sigmoid diverticula. Lymphatic: No adenopathy. Reproductive: Severe prostatomegaly included the median lobe, maximum diameter 7.3 cm. Moderate impression into the bladder. Other: Right inguinal hernia contains a short loop of the distal small bowel without evidence of hernia strangulation or incarceration. There is a small left inguinal fat hernia. No free fluid,  free hemorrhage or free air. Musculoskeletal: Left-sided dorsal fusion construct with interbody hardware and mature fusion L4-5. No acute or significant osseous findings. Review of the MIP images confirms the above findings. IMPRESSION: 1. No acute intracranial CT findings. Stable atrophy and small-vessel changes. 2. Interval lens extractions/replacements with dysconjugate gaze. 3. Endoluminal aortic stent graft again extends from the junction of the isthmus and descending segment ending in the hiatal segment, with increased type 1A endoleak from the posterior aspect of the proximal landing zone, and at least a small type 2 contribution from an intercostal artery. Contrast is seen in the excluded aneurysm sac along the anteromedial aspect to the level of T7, and the descending aorta has increased in size from 4.3 cm in 2021 to 5.0 x 5.4 cm today. 4. Abdominal aortic dissection again extends from just below the aortic stent graft into the infrarenal segment terminating above the aortic bifurcation. The false lumen is patent but with increased near circumferential thrombus since the prior studies. Fusiform infrarenal AAA today  measures 5.7 x 4.4 cm, in 2022 was 5.1 x 4.1 cm. Vascular surgery referral recommended unless already done. 5. Dissection flap again extends into the SMA up to the first main bifurcation, the true lumen supplies about 40% of the overall vessel diameter with the false lumen up to 60%. The false lumen largely supplies the main right-sided SMA division and the true lumen supplies the remainder. 6. Small layering left pleural effusion with fluid density slightly above simple fluid inferiorly. Pleural space hemorrhage would be expected to be denser and more heterogeneous but the pleural effusion etiology is not readily apparent. 7. Mild cardiomegaly, but less than in 2021, with small pericardial effusion anteriorly. 8. IVC and hepatic vein reflux which may be seen with right heart dysfunction or tricuspid regurgitation. 9. Severe prostatomegaly including the median lobe, with moderate impression into the bladder. 10. Right inguinal hernia containing a short loop of the distal small bowel without evidence of hernia strangulation or incarceration. No upstream small bowel obstruction. 11. Small left inguinal fat hernia. 12. Uncomplicated diverticulosis. 13. Aortic atherosclerosis. Aortic Atherosclerosis (ICD10-I70.0). Electronically Signed   By: Francis Quam M.D.   On: 02/29/2024 23:00   MR ANGIO HEAD WO CONTRAST Result Date: 02/29/2024 CLINICAL DATA:  Neuro deficit, acute, stroke suspected EXAM: MRI HEAD WITHOUT CONTRAST MRA HEAD WITHOUT CONTRAST TECHNIQUE: Multiplanar, multiecho pulse sequences of the brain and surrounding structures were obtained without intravenous contrast. Angiographic images of the Circle of Willis were obtained using MRA technique without intravenous contrast. COMPARISON:  None Available. FINDINGS: MRI HEAD FINDINGS Brain: No acute infarction, hemorrhage, hydrocephalus, extra-axial collection or mass lesion. 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 Mild for age  scattered T2/FLAIR hyperintensity white matter, compatible with chronic microvascular ischemic disease. Cerebral atrophy. Vascular: See below. Skull and upper cervical spine: Normal marrow signal. Sinuses/Orbits: Clear sinuses.  No acute orbital findings. Other: No mastoid effusions. MRA HEAD FINDINGS Anterior circulation: Bilateral intracranial ICAs, MCAs, and ACAs are patent without proximal hemodynamically significant stenosis. No aneurysm identified. Posterior circulation: Bilateral intradural vertebral arteries, basilar artery, and bilateral posterior arteries are patent without proximal hemodynamically significant stenosis. No aneurysm identified. IMPRESSION: 1. No evidence of acute intracranial abnormality. 2. No large vessel occlusion or proximal hemodynamically significant stenosis. Electronically Signed   By: Gilmore GORMAN Molt M.D.   On: 02/29/2024 21:50   MR BRAIN WO CONTRAST Result Date: 02/29/2024 CLINICAL DATA:  Neuro deficit, acute, stroke suspected EXAM: MRI HEAD WITHOUT CONTRAST MRA HEAD  WITHOUT CONTRAST TECHNIQUE: Multiplanar, multiecho pulse sequences of the brain and surrounding structures were obtained without intravenous contrast. Angiographic images of the Circle of Willis were obtained using MRA technique without intravenous contrast. COMPARISON:  None Available. FINDINGS: MRI HEAD FINDINGS Brain: No acute infarction, hemorrhage, hydrocephalus, extra-axial collection or mass lesion. 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 Mild for age scattered T2/FLAIR hyperintensity white matter, compatible with chronic microvascular ischemic disease. Cerebral atrophy. Vascular: See below. Skull and upper cervical spine: Normal marrow signal. Sinuses/Orbits: Clear sinuses.  No acute orbital findings. Other: No mastoid effusions. MRA HEAD FINDINGS Anterior circulation: Bilateral intracranial ICAs, MCAs, and ACAs are patent without proximal hemodynamically significant stenosis.  No aneurysm identified. Posterior circulation: Bilateral intradural vertebral arteries, basilar artery, and bilateral posterior arteries are patent without proximal hemodynamically significant stenosis. No aneurysm identified. IMPRESSION: 1. No evidence of acute intracranial abnormality. 2. No large vessel occlusion or proximal hemodynamically significant stenosis. Electronically Signed   By: Gilmore GORMAN Molt M.D.   On: 02/29/2024 21:50    Microbiology: Results for orders placed or performed during the hospital encounter of 02/29/24  Respiratory (~20 pathogens) panel by PCR     Status: None   Collection Time: 03/01/24  5:52 AM   Specimen: Nasopharyngeal Swab; Respiratory  Result Value Ref Range Status   Adenovirus NOT DETECTED NOT DETECTED Final   Coronavirus 229E NOT DETECTED NOT DETECTED Final    Comment: (NOTE) The Coronavirus on the Respiratory Panel, DOES NOT test for the novel  Coronavirus (2019 nCoV)    Coronavirus HKU1 NOT DETECTED NOT DETECTED Final   Coronavirus NL63 NOT DETECTED NOT DETECTED Final   Coronavirus OC43 NOT DETECTED NOT DETECTED Final   Metapneumovirus NOT DETECTED NOT DETECTED Final   Rhinovirus / Enterovirus NOT DETECTED NOT DETECTED Final   Influenza A NOT DETECTED NOT DETECTED Final   Influenza B NOT DETECTED NOT DETECTED Final   Parainfluenza Virus 1 NOT DETECTED NOT DETECTED Final   Parainfluenza Virus 2 NOT DETECTED NOT DETECTED Final   Parainfluenza Virus 3 NOT DETECTED NOT DETECTED Final   Parainfluenza Virus 4 NOT DETECTED NOT DETECTED Final   Respiratory Syncytial Virus NOT DETECTED NOT DETECTED Final   Bordetella pertussis NOT DETECTED NOT DETECTED Final   Bordetella Parapertussis NOT DETECTED NOT DETECTED Final   Chlamydophila pneumoniae NOT DETECTED NOT DETECTED Final   Mycoplasma pneumoniae NOT DETECTED NOT DETECTED Final    Comment: Performed at [facility], [address] [place], [place] [address]  Resp panel by RT-PCR (RSV, Flu A&B,  Covid) Anterior Nasal Swab     Status: None   Collection Time: 03/01/24  8:07 PM   Specimen: Anterior Nasal Swab  Result Value Ref Range Status   SARS Coronavirus 2 by RT PCR NEGATIVE NEGATIVE Final   Influenza A by PCR NEGATIVE NEGATIVE Final   Influenza B by PCR NEGATIVE NEGATIVE Final    Comment: (NOTE) The Xpert Xpress SARS-CoV-2/FLU/RSV plus assay is intended as an aid in the diagnosis of influenza from Nasopharyngeal swab specimens and should not be used as a sole basis for treatment. Nasal washings and aspirates are unacceptable for Xpert Xpress SARS-CoV-2/FLU/RSV testing.  Fact Sheet for Patients: BloggerCourse.com  Fact Sheet for Healthcare Providers: SeriousBroker.it  This test is not yet approved or cleared by the [place]  FDA and has been authorized for detection and/or diagnosis of SARS-CoV-2 by FDA under an Emergency Use Authorization (EUA). This EUA will remain in effect (meaning this test can be  used) for the duration of the COVID-19 declaration under Section 564(b)(1) of the Act, 21 U.S.C. section 360bbb-3(b)(1), unless the authorization is terminated or revoked.     Resp Syncytial Virus by PCR NEGATIVE NEGATIVE Final    Comment: (NOTE) Fact Sheet for Patients: BloggerCourse.com  Fact Sheet for Healthcare Providers: SeriousBroker.it  This test is not yet approved or cleared by the [place]  FDA and has been authorized for detection and/or diagnosis of SARS-CoV-2 by FDA under an Emergency Use Authorization (EUA). This EUA will remain in effect (meaning this test can be used) for the duration of the COVID-19 declaration under Section 564(b)(1) of the Act, 21 U.S.C. section 360bbb-3(b)(1), unless the authorization is terminated or revoked.  Performed at [facility], [address], [place], [place] [address]     Labs: CBC: Recent Labs   Lab 02/29/24 1903 03/01/24 0620 03/01/24 0630 03/02/24 0801 03/04/24 0446  WBC 10.5 9.6  --  6.8 7.6  NEUTROABS  --   --   --  4.8 5.4  HGB 13.6 11.8* 12.9* 11.6* 12.3*  HCT 41.9 37.0* 38.0* 35.7* 37.2*  MCV 88.2 90.0  --  88.1 87.1  PLT 119* 135*  --  141* 211   Basic Metabolic Panel: Recent Labs  Lab 02/29/24 1903 03/01/24 0620 03/01/24 0630 03/02/24 0801 03/04/24 0446  NA 140 141 141 140 139  K 3.6 3.3* 3.3* 3.8 3.5  CL 102 106  --  104 103  CO2 24 22  --  25 21*  GLUCOSE 156* 154*  --  126* 131*  BUN 15 13  --  10 11  CREATININE 1.47* 1.43*  --  1.34* 1.11  CALCIUM  9.3 9.0  --  8.8* 9.1  MG  --   --   --  2.5* 2.1  PHOS  --   --   --  3.1 3.0   Liver Function Tests: Recent Labs  Lab 02/29/24 1903 03/02/24 0801 03/04/24 0446  AST 17 18 16   ALT 13 14 18   ALKPHOS 63 56 58  BILITOT 1.8* 1.3* 1.0  PROT 7.8 6.4* 7.1  ALBUMIN 3.5 2.7* 2.7*   CBG: Recent Labs  Lab 02/29/24 1904 03/01/24 1245  GLUCAP 147* 170*    Discharge time spent: greater than 40 minutes.  Signed: Adriana DELENA Grams, MD [facility] 03/04/2024

## 2024-03-04 NOTE — Care Management Important Message (Signed)
 Important Message  Patient Details  Name: [name] [name] Dea MRN: [id number] Date of Birth: [birth year]   Important Message Given:  Yes - Medicare IM     Claretta Deed 03/04/2024, 11:18 AM

## 2024-03-04 NOTE — TOC Progression Note (Signed)
 Transition of Care [facility]) - Progression Note    Patient Details  Name: Esvin Hnat Gauntt MRN: [id number] Date of Birth: [birth year]  [facility] of [facility]) CM/SW Contact  [name], [place] Phone Number: 03/04/2024, 3:04 PM  Clinical Narrative:     Unable to complete SNF assessment due to patient care in progress. CSW will attempt to see patient again if time allows.   Montie Louder, MSW, LCSW Clinical Social Worker    Expected Discharge Plan: Skilled Nursing Facility Barriers to Discharge: Continued Medical Work up, English as a second language teacher, SNF Pending bed offer  Expected Discharge Plan and Services In-house Referral: Clinical Social Work   Post Acute Care Choice: Skilled Nursing Facility Living arrangements for the past 2 months: Single Family Home Expected Discharge Date: 03/04/24                                     Social Determinants of Health (SDOH) Interventions SDOH Screenings   Food Insecurity: No Food Insecurity (03/01/2024)  Housing: Low Risk  (03/01/2024)  Transportation Needs: No Transportation Needs (03/01/2024)  Utilities: Not At Risk (03/01/2024)  Depression (PHQ2-9): Medium Risk (11/14/2020)  Social Connections: Moderately Integrated (03/01/2024)  Tobacco Use: Medium Risk (03/01/2024)    Readmission Risk Interventions     No data to display

## 2024-03-04 NOTE — TOC Progression Note (Signed)
 Transition of Care [facility]) - Progression Note    Patient Details  Name: [name] [name] MRN: [id number] Date of Birth: 01-04-1946  Transition [facility] Care [facility]) CM/SW Contact  [name], [place] Phone Number: 03/04/2024, 4:16 PM  Clinical Narrative:     Met with patient and his spouse - SNF choice is [facility]  CSW [facility] to confirm bed availability- waiting on response. Patient will need insurance approval before leaving   TOC will continue to follow and assist with discharge planning.  Montie Louder, MSW, LCSW Clinical Social Worker    Expected Discharge Plan: Skilled Nursing Facility Barriers to Discharge: Continued Medical Work up, English as a second language teacher, SNF Pending bed offer  Expected Discharge Plan and Services In-house Referral: Clinical Social Work   Post Acute Care Choice: Skilled Nursing Facility Living arrangements for the past 2 months: Single Family Home Expected Discharge Date: 03/04/24                                     Social Determinants of Health (SDOH) Interventions SDOH Screenings   Food Insecurity: No Food Insecurity (03/01/2024)  Housing: Low Risk  (03/01/2024)  Transportation Needs: No Transportation Needs (03/01/2024)  Utilities: Not At Risk (03/01/2024)  Depression (PHQ2-9): Medium Risk (11/14/2020)  Social Connections: Moderately Integrated (03/01/2024)  Tobacco Use: Medium Risk (03/01/2024)    Readmission Risk Interventions     No data to display

## 2024-03-05 DIAGNOSIS — K5790 Diverticulosis of intestine, part unspecified, without perforation or abscess without bleeding: Secondary | ICD-10-CM | POA: Diagnosis not present

## 2024-03-05 DIAGNOSIS — E782 Mixed hyperlipidemia: Secondary | ICD-10-CM | POA: Diagnosis not present

## 2024-03-05 DIAGNOSIS — N1831 Chronic kidney disease, stage 3a: Secondary | ICD-10-CM | POA: Diagnosis not present

## 2024-03-05 DIAGNOSIS — K4020 Bilateral inguinal hernia, without obstruction or gangrene, not specified as recurrent: Secondary | ICD-10-CM | POA: Diagnosis not present

## 2024-03-05 DIAGNOSIS — I715 Thoracoabdominal aortic aneurysm, ruptured, unspecified: Secondary | ICD-10-CM | POA: Diagnosis not present

## 2024-03-05 DIAGNOSIS — M6281 Muscle weakness (generalized): Secondary | ICD-10-CM | POA: Diagnosis not present

## 2024-03-05 DIAGNOSIS — E1122 Type 2 diabetes mellitus with diabetic chronic kidney disease: Secondary | ICD-10-CM | POA: Diagnosis not present

## 2024-03-05 DIAGNOSIS — E1140 Type 2 diabetes mellitus with diabetic neuropathy, unspecified: Secondary | ICD-10-CM | POA: Diagnosis not present

## 2024-03-05 DIAGNOSIS — I10 Essential (primary) hypertension: Secondary | ICD-10-CM | POA: Diagnosis not present

## 2024-03-05 DIAGNOSIS — J309 Allergic rhinitis, unspecified: Secondary | ICD-10-CM | POA: Diagnosis not present

## 2024-03-05 DIAGNOSIS — E119 Type 2 diabetes mellitus without complications: Secondary | ICD-10-CM | POA: Diagnosis not present

## 2024-03-05 DIAGNOSIS — I639 Cerebral infarction, unspecified: Secondary | ICD-10-CM | POA: Diagnosis not present

## 2024-03-05 DIAGNOSIS — R2689 Other abnormalities of gait and mobility: Secondary | ICD-10-CM | POA: Diagnosis not present

## 2024-03-05 DIAGNOSIS — E785 Hyperlipidemia, unspecified: Secondary | ICD-10-CM | POA: Diagnosis not present

## 2024-03-05 DIAGNOSIS — G8929 Other chronic pain: Secondary | ICD-10-CM | POA: Diagnosis not present

## 2024-03-05 DIAGNOSIS — M519 Unspecified thoracic, thoracolumbar and lumbosacral intervertebral disc disorder: Secondary | ICD-10-CM | POA: Diagnosis not present

## 2024-03-05 DIAGNOSIS — E569 Vitamin deficiency, unspecified: Secondary | ICD-10-CM | POA: Diagnosis not present

## 2024-03-05 DIAGNOSIS — E1136 Type 2 diabetes mellitus with diabetic cataract: Secondary | ICD-10-CM | POA: Diagnosis not present

## 2024-03-05 DIAGNOSIS — K219 Gastro-esophageal reflux disease without esophagitis: Secondary | ICD-10-CM | POA: Diagnosis not present

## 2024-03-05 DIAGNOSIS — E8809 Other disorders of plasma-protein metabolism, not elsewhere classified: Secondary | ICD-10-CM | POA: Diagnosis not present

## 2024-03-05 DIAGNOSIS — I700 Atherosclerosis of aorta: Secondary | ICD-10-CM | POA: Diagnosis not present

## 2024-03-05 DIAGNOSIS — I2699 Other pulmonary embolism without acute cor pulmonale: Secondary | ICD-10-CM | POA: Diagnosis not present

## 2024-03-05 DIAGNOSIS — G9340 Encephalopathy, unspecified: Secondary | ICD-10-CM | POA: Diagnosis not present

## 2024-03-05 MED ORDER — POLYETHYLENE GLYCOL 3350 17 G PO PACK
17.0000 g | PACK | Freq: Every day | ORAL | Status: DC
  Administered 2024-03-05: 17 g via ORAL
  Filled 2024-03-05: qty 1

## 2024-03-05 NOTE — Discharge Summary (Signed)
 Physician Discharge Summary   Patient: Dare Sanger Cwik MRN: [id number] DOB: [birth year]  Admit date:     02/29/2024  Discharge date: 03/05/24  Discharge Physician: MDALA-GAUSI, GOLDEN PILLOW   PCP: Husain, Karrar, MD   Recommendations at discharge:   Follow-up with PCP in 1 to 2 weeks Follow-up with vascular surgery in 1 to 2 weeks Recommend outpatient palliative care consult.  Discharge Diagnoses: Principal Problem:   Acute encephalopathy  Resolved Problems:   * No resolved hospital problems. *  Hospital Course: 78 year old man with PMH significant for but not limited to chronic back pain, GERD, hypertension, hyperlipidemia, history of AAA status post repair with endoleak, dementia and other comorbidities who presented to the ED with worsening confusion, progressive and associated with generalized weakness that happened hours prior to admission.  Per his wife he was unable to get up from the commode due to his significant weakness and had some difficulty following commands and was noted to be very confused so EMS was called.    Wife stated that he was not baseline so he was  admitted for further evaluation and was found to be globally weak.  Labs were done and showed that he had a mild AKI vs. CKD Stage 3a.    Given his findings on the CT of the abdomen with his AAA measuring 5.7 x 4.4 cm vascular surgery was consulted for further evaluation and since this showed progression in the infrarenal aorta and given that he is an asymptomatic diabetic.  They are recommending outpatient treatment for aneurysmal degeneration.  PT/OT recommended SNF and patient in family are in agreement to pursue this.  Patient is being discharged to a skilled nursing facility.  Assessment and Plan:  Acute Encephalopathy: Improved. MRI negative and cva ruled out.  Possibly related to uncontrolled hypertension. Neuro workup initiated and ordered Ammonia was 28, UDS Negative, vbg , TSH was 1.065. EEG was  suggestive of mild diffuse encephalopathy with no seizures or epileptiform discharges seen throughout the recording. Mentation is much closer to his baseline per wife and essentially resolved.   Global weakness in setting of chronic debility:  Uses walker at baseline  Possibly due to acute medical illness.  Total CK neg  Statin was held.  RVP Negative. Inflammatory markers elevated as CRP is 22.0 and ESR is 65.  Evaluated by PT/OT who recommended SNF.   Essential Hypertension uncontrolled: Resumed home regimen of Carvedilol  25 mg po BID, Amlodipine  2.5 mg po Daily, and Hydralazine  10 mg q6h.    AKI vs CKD Stage 3a:  BUN/Cr went from 15/1.47 -> 13/1.43 -> 10/1.34.  Patient was treated with IV fluids.   Creatinine was 1.11 at discharge.   AAA with Endo Leak/Aortic Dissection: CT noted enlarging AAA. Vascular consulted and noted no acute intervention currently But is a candidate for intervention regarding enlarging AAA. Vascular Recommending F/U in the outpt setting 4-6 weeks to discuss Endovascular Repair possibly requiring Tambe versus modified Cook graft   GERD/GI Prophylaxis: Continued with PPI   Chronic Back Pain: No acute issue.continued with supportive care and pain regimen   HLD: Statin was held.  Consultants: Vascular surgery Procedures performed: EEG Disposition: Skilled nursing facility Diet recommendation:  Discharge Diet Orders (From admission, onward)     Start     Ordered   03/04/24 0000  Diet - low sodium heart healthy        03/04/24 1202           Regular diet DISCHARGE MEDICATION: Allergies  as of 03/05/2024       Reactions   Lyrica [pregabalin] Nausea Only, Other (See Comments)   Hallucinations Dizziness    Sulfa Antibiotics Other (See Comments)   Unknown reaction        Medication List     PAUSE taking these medications    rosuvastatin  5 MG tablet Wait to take this until: March 09, 2024 Commonly known as: CRESTOR  Take 5 mg by mouth at  bedtime.       STOP taking these medications    losartan 50 MG tablet Commonly known as: COZAAR       TAKE these medications    acetaminophen  500 MG tablet Commonly known as: TYLENOL  Take 1,000 mg by mouth 2 (two) times daily.   amLODipine  10 MG tablet Commonly known as: NORVASC  Take 10 mg by mouth daily.   aspirin  EC 81 MG tablet Take 81 mg by mouth daily.   carvedilol  25 MG tablet Commonly known as: COREG  Take 1 tablet (25 mg total) by mouth 2 (two) times daily with a meal.   hydrALAZINE  10 MG tablet Commonly known as: APRESOLINE  Take 1 tablet (10 mg total) by mouth 3 (three) times daily. What changed: when to take this   memantine  10 MG tablet Commonly known as: NAMENDA  TAKE 1 TABLET BY MOUTH TWICE DAILY   Mens 50+ Multivitamin Tabs Take 1 tablet by mouth daily.   metFORMIN 500 MG tablet Commonly known as: GLUCOPHAGE Take 500 mg by mouth 2 (two) times daily.   pantoprazole  40 MG tablet Commonly known as: PROTONIX  Take 1 tablet (40 mg total) by mouth daily.   tamsulosin  0.4 MG Caps capsule Commonly known as: FLOMAX  Take 1 capsule (0.4 mg total) by mouth daily.        Follow-up Information     Serene Gaile ORN, MD In 3 days.   Specialties: Vascular Surgery, Cardiology Contact information: [address] [place] [address] [phone]         [name], MD In 1 week.   Specialty: Internal Medicine Contact information: [address] [place] [address] [phone]                Discharge Exam: Fredricka Weights   02/29/24 1754 03/01/24 1144  Weight: 97.5 kg 97.5 kg   Physical Exam on Day of Discharge   General: Alert, cheerful, oriented X2 Oral cavity: moist mucous membranes  Neck: supple  Chest: clear to auscultation. No crackles, no wheezes  CVS: S1,S2 RRR. No murmurs  Abd: No distention, soft, non-tender. No masses palpable  Extr: No edema    Condition at discharge: stable  The  results of significant diagnostics from this hospitalization (including imaging, microbiology, ancillary and laboratory) are listed below for reference.   Imaging Studies: DG Chest Port 1 View Result Date: 03/03/2024 CLINICAL DATA:  Right-sided chest pain EXAM: PORTABLE CHEST 1 VIEW COMPARISON:  03/01/2024 FINDINGS: Cardiac shadow is enlarged but stable. Tortuous thoracic aorta is noted with prior stent graft therapy. Left basilar atelectasis is again noted. No other focal abnormality is noted. IMPRESSION: Stable left basilar atelectasis. Electronically Signed   By: Oneil Devonshire M.D.   On: 03/03/2024 02:55   ECHOCARDIOGRAM COMPLETE Result Date: 03/02/2024    ECHOCARDIOGRAM REPORT   Patient Name:   DANGELO GUZZETTA Stahly Date of Exam: 03/02/2024 Medical Rec #:  [id number]      Height:       71.0 in Accession #:    [id number]  Weight:       214.9 lb Date of Birth:  [birth year]      BSA:          2.174 m Patient Age:    78 years       BP:           166/95 mmHg Patient Gender: M              HR:           50 bpm. Exam Location:  Inpatient Procedure: 2D Echo, Color Doppler and Cardiac Doppler (Both Spectral and Color            Flow Doppler were utilized during procedure). Indications:    Elevated troponin  History:        Patient has prior history of Echocardiogram examinations, most                 recent 10/09/2019. CVA.  Sonographer:    Benard Stallion Referring Phys: [id number] SARA-MAIZ A THOMAS IMPRESSIONS  1. Left ventricular ejection fraction, by estimation, is 60 to 65%. The left ventricle has normal function. The left ventricle has no regional wall motion abnormalities. Left ventricular diastolic parameters were normal.  2. Right ventricular systolic function is normal. The right ventricular size is normal. There is normal pulmonary artery systolic pressure. The estimated right ventricular systolic pressure is 30.7 mmHg.  3. The mitral valve is degenerative. Mild mitral valve regurgitation. No evidence of mitral  stenosis.  4. The aortic valve is tricuspid. Aortic valve regurgitation is mild to moderate. No aortic stenosis is present.  5. 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 Aortic dilatation noted. Aneurysm of the ascending aorta, measuring 50 mm. There is dilatation of the aortic arch, measuring 38 mm.  6. The inferior vena cava is dilated in size with >50% respiratory variability, suggesting right atrial pressure of 8 mmHg. Conclusion(s)/Recommendation(s): Recommend CTA or MRA of the chest, abdomen, pelvis, to further evaluate the aorta. Abdominal aorta duplex to evaluate for abdominal aortic aneurysm. Clinical correlation required. Results conveyed to Dr. Alejandro Lazarus Marker via secure chat. FINDINGS  Left Ventricle: Left ventricular ejection fraction, by estimation, is 60 to 65%. The left ventricle has normal function. The left ventricle has no regional wall motion abnormalities. There is no left ventricular hypertrophy of the basal-septal segment. Left ventricular diastolic parameters were normal. Right Ventricle: The right ventricular size is normal. No increase in right ventricular wall thickness. Right ventricular systolic function is normal. There is normal pulmonary artery systolic pressure. The tricuspid regurgitant velocity is 2.38 m/s, and  with an assumed right atrial pressure of 8 mmHg, the estimated right ventricular systolic pressure is 30.7 mmHg. Left Atrium: Left atrial size was normal in size. Right Atrium: Right atrial size was normal in size. Pericardium: Trivial pericardial effusion is present. Mitral Valve: The mitral valve is degenerative in appearance. Mild mitral valve regurgitation. No evidence of mitral valve stenosis. Tricuspid Valve: The tricuspid valve is grossly normal. Tricuspid valve regurgitation is trivial. No evidence of tricuspid stenosis. Aortic Valve: The aortic valve is tricuspid. Aortic valve regurgitation is mild to moderate. No aortic stenosis is  present. Aortic valve mean gradient measures 4.5 mmHg. Aortic valve peak gradient measures 8.4 mmHg. Aortic valve area, by VTI measures 3.21 cm. Pulmonic Valve: The pulmonic valve was normal in structure. Pulmonic valve regurgitation is trivial. No evidence of pulmonic stenosis. Aorta: Aneurysmal dilatation of the abdominal aorta, 31mm (limited views). The aortic root is  normal in size and structure and aortic dilatation noted. There is dilatation of the aortic arch, measuring 38 mm. There is an aneurysm involving the ascending aorta measuring 50 mm. Venous: The inferior vena cava is dilated in size with greater than 50% respiratory variability, suggesting right atrial pressure of 8 mmHg. IAS/Shunts: The atrial septum is grossly normal.  LEFT VENTRICLE PLAX 2D LVIDd:         5.10 cm   Diastology LVIDs:         3.20 cm   LV e' medial:    8.38 cm/s LV PW:         1.20 cm   LV E/e' medial:  9.4 LV IVS:        1.20 cm   LV e' lateral:   11.90 cm/s LVOT diam:     2.20 cm   LV E/e' lateral: 6.6 LV SV:         103 LV SV Index:   47 LVOT Area:     3.80 cm  RIGHT VENTRICLE RV Basal diam:  3.00 cm RV Mid diam:    2.70 cm RV S prime:     15.00 cm/s TAPSE (M-mode): 2.3 cm LEFT ATRIUM           Index        RIGHT ATRIUM           Index LA diam:      2.90 cm 1.33 cm/m   RA Area:     16.10 cm LA Vol (A2C): 81.0 ml 37.26 ml/m  RA Volume:   31.80 ml  14.63 ml/m LA Vol (A4C): 55.2 ml 25.39 ml/m  AORTIC VALVE AV Area (Vmax):    3.22 cm AV Area (Vmean):   3.21 cm AV Area (VTI):     3.21 cm AV Vmax:           145.00 cm/s AV Vmean:          96.250 cm/s AV VTI:            0.320 m AV Peak Grad:      8.4 mmHg AV Mean Grad:      4.5 mmHg LVOT Vmax:         123.00 cm/s LVOT Vmean:        81.200 cm/s LVOT VTI:          0.270 m LVOT/AV VTI ratio: 0.84  AORTA Ao Root diam: 3.70 cm Ao Asc diam:  5.00 cm MITRAL VALVE               TRICUSPID VALVE MV Area (PHT): 3.27 cm    TR Peak grad:   22.7 mmHg MV Decel Time: 232 msec    TR Vmax:         238.00 cm/s MV E velocity: 79.10 cm/s MV A velocity: 85.40 cm/s  SHUNTS MV E/A ratio:  0.93        Systemic VTI:  0.27 m                            Systemic Diam: 2.20 cm Sunit Tolia Electronically signed by Madonna Large Signature Date/Time: 03/02/2024/1:09:37 PM    Final    EEG adult Result Date: 03/01/2024 Shelton Arlin KIDD, MD     03/01/2024 10:18 AM Patient Name: Aydrian Halpin Rittenhouse MRN: [id number] Epilepsy Attending: Arlin KIDD Shelton Referring Physician/Provider: Debby Camila LABOR, MD Date: 03/01/2024 Duration: 22.02 mins Patient history: 78yo  M with ams. EEG to evaluate for seizure Level of alertness: Awake AEDs during EEG study: None Technical aspects: This EEG study was done with scalp electrodes positioned according to the 10-20 International system of electrode placement. Electrical activity was reviewed with band pass filter of 1-70Hz , sensitivity of 7 uV/mm, display speed of 27mm/sec with a 60Hz  notched filter applied as appropriate. EEG data were recorded continuously and digitally stored.  Video monitoring was available and reviewed as appropriate. Description: The posterior dominant rhythm consists of 8 Hz activity of moderate voltage (25-35 uV) seen predominantly in posterior head regions, symmetric and reactive to eye opening and eye closing. EEG showed intermittent generalized 3 to 6 Hz theta-delta slowing. Hyperventilation and photic stimulation were not performed.   ABNORMALITY - Intermittent slow, generalized IMPRESSION: This study is suggestive of mild diffuse encephalopathy. No seizures or epileptiform discharges were seen throughout the recording. Arlin MALVA Krebs   DG CHEST PORT 1 VIEW Result Date: 03/01/2024 CLINICAL DATA:  Little status changes. EXAM: PORTABLE CHEST 1 VIEW COMPARISON:  09/19/2023 FINDINGS: The cardio pericardial silhouette is enlarged. New retrocardiac atelectasis or infiltrate noted with probable left pleural effusion. Descending thoracic aortic endograft evident. Right  lung clear. No acute bony abnormality. IMPRESSION: New retrocardiac atelectasis or infiltrate with probable left pleural effusion. Electronically Signed   By: Camellia Candle M.D.   On: 03/01/2024 06:12   CT Head Wo Contrast Result Date: 02/29/2024 CLINICAL DATA:  Altered mental status. Unknown cause. Also with bilateral leg weakness and loss of bowel control. The patient has a known abdominal aortic dissection and aneurysm. Acute aortic syndrome is suspected. EXAM: CT HEAD WITHOUT CONTRAST CT ANGIOGRAPHY CHEST, ABDOMEN AND PELVIS TECHNIQUE: Contiguous axial images were obtained from the base of the skull through the vertex without intravenous contrast. Multiplanar CT image reconstructions were also created and reviewed. Noncontrast axial CT of the chest was initially obtained. Multidetector CT imaging through the chest, abdomen and pelvis was performed using the standard protocol during bolus administration of intravenous contrast. Multiplanar reconstructed images and MIPs were obtained and reviewed to evaluate the vascular anatomy. RADIATION DOSE REDUCTION: This exam was performed according to the departmental dose-optimization program which includes automated exposure control, adjustment of the mA and/or kV according to patient size and/or use of iterative reconstruction technique. CONTRAST:  OMNIPAQUE  IOHEXOL  350 MG/ML SOLN COMPARISON:  MRI brain from today, head CT from 05/07/2020, CT abdomen and pelvis with contrast 04/13/2021, and CTA chest, abdomen and pelvis 07/19/2020 FINDINGS: CT HEAD WITHOUT CONTRAST FINDINGS Brain: There is moderately developed cerebral atrophy with atrophic ventriculomegaly and moderate small vessel disease. Cerebellum and brainstem are unremarkable. No cortical based acute infarct, hemorrhage, mass or mass effect is seen. There is no midline shift.  Basal cisterns are clear. Vascular: No hyperdense vessels or unexpected calcification. Scattered calcification both siphons.  Skull: Negative for fractures or focal lesions. Sinuses/orbits: Interval lens extractions new from 2021. Dysconjugate gaze. Otherwise negative orbits. Clear sinuses and mastoids. Other: None. CTA CHEST FINDINGS Cardiovascular: Endoluminal aortic stent graft again extends from the junction of the isthmus and descending segment ending in the hiatal segment, again with the distal tines incompletely apposed but unchanged in appearance. There is aortic atherosclerosis. The representative aortic measurements are as follows: Valve: 2.2 cm, previously 3 cm, on 11:92; Sinuses: 4.2 cm on 11:91, previously 4.6 cm; Sinotubular junction: 3.7 cm on 11:93, previously 3.8 cm; Mid ascending segment 4.3 x 4.3 cm on 11:98 and 12:102, previously 4.6 cm; Mid arch: 3.4 cm  on 11:87; Isthmus: 3.5 cm on 12:128; Mid descending aorta: 5.0 cm AP and 5.4 cm transverse on 12:143 and 11:46, previously 4.3 cm; distal descending segment: 4.0 cm on 12:131, previously 3.8 cm. There is an increased, but noted previously type 1A endoleak from the posterior aspect of the proximal sealing zone, with at least a small type 2 contribution from an intercostal artery best seen on 7: 40-42. Contrast is seen in the excluded aneurysm sac along the anteromedial aspect to the level of T7, with increased the size of the descending aorta compared to the prior study as described above. I do not see a thoracic aortic dissection. There is mild cardiomegaly, but less than in 2021, small pericardial effusion anteriorly. The pulmonary arteries are well opacified. No embolus is seen. There is IVC and hepatic vein reflux which may be seen with right heart dysfunction or tricuspid regurgitation. The pulmonary veins are nondistended. Great vessels are tortuous but clear. Mediastinum/Nodes: Low-density fluid in the superior pericardial recess. No mediastinal hematoma. No adenopathy. Lungs/Pleura: Small layering left pleural effusion. The fluid measures 25 Hounsfield units  slightly above simple fluid inferiorly, only measuring 7 Hounsfield units proximally. Pleural space hemorrhage would be expected to be denser and more heterogeneous but the pleural effusion etiology is not readily apparent. The lungs are otherwise clear. Central airways are patent. Musculoskeletal: Stable T8 vertebral body sclerotic lesion probable hemangioma. No acute or significant osseous findings. Review of the MIP images confirms the above findings. 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 There is communication between the true and false lumens at the inferior aspect. The false lumen is posterior to the right. The false lumen is patent but with increased near circumferential thrombus since the prior studies. 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 Celiac: Arises from the true lumen. Scattered calcific plaques. No stenosis, dissection or aneurysm. SMA: Dissection flap again extends into the SMA up to the first vessel main bifurcation, the true lumen supplies about 40% of the overall vessel diameter with the false lumen up to 60%. The false lumen largely supplies the main right-sided SMA division and the true lumen supplies the remainder. Opacification of the false lumen is significantly less than of the true lumen. The false lumen was better opacified on the prior study than today. Renals: Supplied by the true lumen. Both are widely patent, small amount of negligible calcific plaque at both ostia. IMA: Patent without evidence of aneurysm, dissection, vasculitis or significant stenosis. Inflow: Patent without evidence of aneurysm, dissection, vasculitis or significant stenosis. There is moderate inflow vessel tortuosity which was seen  previously, scattered calcific plaques. Veins: No obvious venous abnormality within the limitations of this arterial phase study. Review of the MIP images confirms the above findings. NON-VASCULAR Hepatobiliary: No focal liver abnormality is seen. No gallstones, gallbladder wall thickening, or biliary dilatation. Pancreas: Fatty infiltration. No mass, ductal dilatation or inflammatory change. Spleen: No abnormality. Adrenals/Urinary Tract: No renal mass enhancement.  No adrenal mass. There are a few bilateral too small to characterize Bosniak 2 cortical cysts. No follow-up imaging is recommended. There is contrast in the collecting systems. There previously was a 2 mm nonobstructing stone in the inferior pole of the right kidney. The contrast would obscure intrarenal stones today but there  is no hydronephrosis, no ureteral filling defect. The bladder is moderately impressed upon by median lobe hypertrophy of the prostate. But has a normal wall thickness. Stomach/Bowel: Negative for dilatation or wall thickening including the appendix. Uncomplicated sigmoid diverticula. Lymphatic: No adenopathy. Reproductive: Severe prostatomegaly included the median lobe, maximum diameter 7.3 cm. Moderate impression into the bladder. Other: Right inguinal hernia contains a short loop of the distal small bowel without evidence of hernia strangulation or incarceration. There is a small left inguinal fat hernia. No free fluid, free hemorrhage or free air. Musculoskeletal: Left-sided dorsal fusion construct with interbody hardware and mature fusion L4-5. No acute or significant osseous findings. Review of the MIP images confirms the above findings. IMPRESSION: 1. No acute intracranial CT findings. Stable atrophy and small-vessel changes. 2. Interval lens extractions/replacements with dysconjugate gaze. 3. Endoluminal aortic stent graft again extends from the junction of the isthmus and descending segment ending in the hiatal segment,  with increased type 1A endoleak from the posterior aspect of the proximal landing zone, and at least a small type 2 contribution from an intercostal artery. Contrast is seen in the excluded aneurysm sac along the anteromedial aspect to the level of T7, and the descending aorta has increased in size from 4.3 cm in 2021 to 5.0 x 5.4 cm today. 4. Abdominal aortic dissection again extends from just below the aortic stent graft into the infrarenal segment terminating above the aortic bifurcation. The false lumen is patent but with increased near circumferential thrombus since the prior studies. 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 Vascular surgery referral recommended unless already done. 5. Dissection flap again extends into the SMA up to the first main bifurcation, the true lumen supplies about 40% of the overall vessel diameter with the false lumen up to 60%. The false lumen largely supplies the main right-sided SMA division and the true lumen supplies the remainder. 6. Small layering left pleural effusion with fluid density slightly above simple fluid inferiorly. Pleural space hemorrhage would be expected to be denser and more heterogeneous but the pleural effusion etiology is not readily apparent. 7. Mild cardiomegaly, but less than in 2021, with small pericardial effusion anteriorly. 8. IVC and hepatic vein reflux which may be seen with right heart dysfunction or tricuspid regurgitation. 9. Severe prostatomegaly including the median lobe, with moderate impression into the bladder. 10. Right inguinal hernia containing a short loop of the distal small bowel without evidence of hernia strangulation or incarceration. No upstream small bowel obstruction. 11. Small left inguinal fat hernia. 12. Uncomplicated diverticulosis. 13. Aortic atherosclerosis. Aortic Atherosclerosis (ICD10-I70.0). Electronically Signed   By: Francis Quam M.D.   On: 02/29/2024 23:00   CT Angio  Chest/Abd/Pel for Dissection W and/or Wo Contrast Result Date: 02/29/2024 CLINICAL DATA:  Altered mental status. Unknown cause. Also with bilateral leg weakness and loss of bowel control. The patient has a known abdominal aortic dissection and aneurysm. Acute aortic syndrome is suspected. EXAM: CT HEAD WITHOUT CONTRAST CT ANGIOGRAPHY CHEST, ABDOMEN AND PELVIS TECHNIQUE: Contiguous axial images were obtained from the base of the skull through the vertex without intravenous contrast. Multiplanar CT image reconstructions were also created and reviewed. Noncontrast axial CT of the chest was initially obtained. Multidetector CT imaging through the chest, abdomen and pelvis was performed using the standard protocol during bolus administration of intravenous contrast. Multiplanar reconstructed images and MIPs were obtained and reviewed to evaluate the vascular anatomy. RADIATION DOSE REDUCTION: This exam  was performed according to the departmental dose-optimization program which includes automated exposure control, adjustment of the mA and/or kV according to patient size and/or use of iterative reconstruction technique. CONTRAST:  OMNIPAQUE  IOHEXOL  350 MG/ML SOLN COMPARISON:  MRI brain from today, head CT from 05/07/2020, CT abdomen and pelvis with contrast 04/13/2021, and CTA chest, abdomen and pelvis 07/19/2020 FINDINGS: CT HEAD WITHOUT CONTRAST FINDINGS Brain: There is moderately developed cerebral atrophy with atrophic ventriculomegaly and moderate small vessel disease. Cerebellum and brainstem are unremarkable. No cortical based acute infarct, hemorrhage, mass or mass effect is seen. There is no midline shift.  Basal cisterns are clear. Vascular: No hyperdense vessels or unexpected calcification. Scattered calcification both siphons. Skull: Negative for fractures or focal lesions. Sinuses/orbits: Interval lens extractions new from 2021. Dysconjugate gaze. Otherwise negative orbits. Clear sinuses and mastoids.  Other: None. CTA CHEST FINDINGS Cardiovascular: Endoluminal aortic stent graft again extends from the junction of the isthmus and descending segment ending in the hiatal segment, again with the distal tines incompletely apposed but unchanged in appearance. There is aortic atherosclerosis. The representative aortic measurements are as follows: Valve: 2.2 cm, previously 3 cm, on 11:92; Sinuses: 4.2 cm on 11:91, previously 4.6 cm; Sinotubular junction: 3.7 cm on 11:93, previously 3.8 cm; Mid ascending segment 4.3 x 4.3 cm on 11:98 and 12:102, previously 4.6 cm; Mid arch: 3.4 cm on 11:87; Isthmus: 3.5 cm on 12:128; Mid descending aorta: 5.0 cm AP and 5.4 cm transverse on 12:143 and 11:46, previously 4.3 cm; distal descending segment: 4.0 cm on 12:131, previously 3.8 cm. There is an increased, but noted previously type 1A endoleak from the posterior aspect of the proximal sealing zone, with at least a small type 2 contribution from an intercostal artery best seen on 7: 40-42. Contrast is seen in the excluded aneurysm sac along the anteromedial aspect to the level of T7, with increased the size of the descending aorta compared to the prior study as described above. I do not see a thoracic aortic dissection. There is mild cardiomegaly, but less than in 2021, small pericardial effusion anteriorly. The pulmonary arteries are well opacified. No embolus is seen. There is IVC and hepatic vein reflux which may be seen with right heart dysfunction or tricuspid regurgitation. The pulmonary veins are nondistended. Great vessels are tortuous but clear. Mediastinum/Nodes: Low-density fluid in the superior pericardial recess. No mediastinal hematoma. No adenopathy. Lungs/Pleura: Small layering left pleural effusion. The fluid measures 25 Hounsfield units slightly above simple fluid inferiorly, only measuring 7 Hounsfield units proximally. Pleural space hemorrhage would be expected to be denser and more heterogeneous but the pleural  effusion etiology is not readily apparent. The lungs are otherwise clear. Central airways are patent. Musculoskeletal: Stable T8 vertebral body sclerotic lesion probable hemangioma. No acute or significant osseous findings. Review of the MIP images confirms the above findings. 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 There is communication between the true and false lumens at the inferior aspect. The false lumen is posterior to the right. The false lumen is patent but with increased near circumferential thrombus since the prior studies. 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 Celiac: Arises from the true lumen. Scattered calcific plaques. No stenosis, dissection or aneurysm. SMA: Dissection flap again extends into  the SMA up to the first vessel main bifurcation, the true lumen supplies about 40% of the overall vessel diameter with the false lumen up to 60%. The false lumen largely supplies the main right-sided SMA division and the true lumen supplies the remainder. Opacification of the false lumen is significantly less than of the true lumen. The false lumen was better opacified on the prior study than today. Renals: Supplied by the true lumen. Both are widely patent, small amount of negligible calcific plaque at both ostia. IMA: Patent without evidence of aneurysm, dissection, vasculitis or significant stenosis. Inflow: Patent without evidence of aneurysm, dissection, vasculitis or significant stenosis. There is moderate inflow vessel tortuosity which was seen previously, scattered calcific plaques. Veins: No obvious venous abnormality within the limitations of this arterial phase study. Review of the MIP images confirms the above  findings. NON-VASCULAR Hepatobiliary: No focal liver abnormality is seen. No gallstones, gallbladder wall thickening, or biliary dilatation. Pancreas: Fatty infiltration. No mass, ductal dilatation or inflammatory change. Spleen: No abnormality. Adrenals/Urinary Tract: No renal mass enhancement.  No adrenal mass. There are a few bilateral too small to characterize Bosniak 2 cortical cysts. No follow-up imaging is recommended. There is contrast in the collecting systems. There previously was a 2 mm nonobstructing stone in the inferior pole of the right kidney. The contrast would obscure intrarenal stones today but there is no hydronephrosis, no ureteral filling defect. The bladder is moderately impressed upon by median lobe hypertrophy of the prostate. But has a normal wall thickness. Stomach/Bowel: Negative for dilatation or wall thickening including the appendix. Uncomplicated sigmoid diverticula. Lymphatic: No adenopathy. Reproductive: Severe prostatomegaly included the median lobe, maximum diameter 7.3 cm. Moderate impression into the bladder. Other: Right inguinal hernia contains a short loop of the distal small bowel without evidence of hernia strangulation or incarceration. There is a small left inguinal fat hernia. No free fluid, free hemorrhage or free air. Musculoskeletal: Left-sided dorsal fusion construct with interbody hardware and mature fusion L4-5. No acute or significant osseous findings. Review of the MIP images confirms the above findings. IMPRESSION: 1. No acute intracranial CT findings. Stable atrophy and small-vessel changes. 2. Interval lens extractions/replacements with dysconjugate gaze. 3. Endoluminal aortic stent graft again extends from the junction of the isthmus and descending segment ending in the hiatal segment, with increased type 1A endoleak from the posterior aspect of the proximal landing zone, and at least a small type 2 contribution from an intercostal artery. Contrast is seen in  the excluded aneurysm sac along the anteromedial aspect to the level of T7, and the descending aorta has increased in size from 4.3 cm in 2021 to 5.0 x 5.4 cm today. 4. Abdominal aortic dissection again extends from just below the aortic stent graft into the infrarenal segment terminating above the aortic bifurcation. The false lumen is patent but with increased near circumferential thrombus since the prior studies. 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 Vascular surgery referral recommended unless already done. 5. Dissection flap again extends into the SMA up to the first main bifurcation, the true lumen supplies about 40% of the overall vessel diameter with the false lumen up to 60%. The false lumen largely supplies the main right-sided SMA division and the true lumen supplies the remainder. 6. Small layering left pleural effusion with fluid density slightly above simple fluid inferiorly. Pleural space hemorrhage would be expected to be denser and more heterogeneous but the pleural effusion etiology is  not readily apparent. 7. Mild cardiomegaly, but less than in 2021, with small pericardial effusion anteriorly. 8. IVC and hepatic vein reflux which may be seen with right heart dysfunction or tricuspid regurgitation. 9. Severe prostatomegaly including the median lobe, with moderate impression into the bladder. 10. Right inguinal hernia containing a short loop of the distal small bowel without evidence of hernia strangulation or incarceration. No upstream small bowel obstruction. 11. Small left inguinal fat hernia. 12. Uncomplicated diverticulosis. 13. Aortic atherosclerosis. Aortic Atherosclerosis (ICD10-I70.0). Electronically Signed   By: Francis Quam M.D.   On: 02/29/2024 23:00   MR ANGIO HEAD WO CONTRAST Result Date: 02/29/2024 CLINICAL DATA:  Neuro deficit, acute, stroke suspected EXAM: MRI HEAD WITHOUT CONTRAST MRA HEAD WITHOUT CONTRAST TECHNIQUE: Multiplanar,  multiecho pulse sequences of the brain and surrounding structures were obtained without intravenous contrast. Angiographic images of the Circle of Willis were obtained using MRA technique without intravenous contrast. COMPARISON:  None Available. FINDINGS: MRI HEAD FINDINGS Brain: No acute infarction, hemorrhage, hydrocephalus, extra-axial collection or mass lesion. 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 Mild for age scattered T2/FLAIR hyperintensity white matter, compatible with chronic microvascular ischemic disease. Cerebral atrophy. Vascular: See below. Skull and upper cervical spine: Normal marrow signal. Sinuses/Orbits: Clear sinuses.  No acute orbital findings. Other: No mastoid effusions. MRA HEAD FINDINGS Anterior circulation: Bilateral intracranial ICAs, MCAs, and ACAs are patent without proximal hemodynamically significant stenosis. No aneurysm identified. Posterior circulation: Bilateral intradural vertebral arteries, basilar artery, and bilateral posterior arteries are patent without proximal hemodynamically significant stenosis. No aneurysm identified. IMPRESSION: 1. No evidence of acute intracranial abnormality. 2. No large vessel occlusion or proximal hemodynamically significant stenosis. Electronically Signed   By: Gilmore GORMAN Molt M.D.   On: 02/29/2024 21:50   MR BRAIN WO CONTRAST Result Date: 02/29/2024 CLINICAL DATA:  Neuro deficit, acute, stroke suspected EXAM: MRI HEAD WITHOUT CONTRAST MRA HEAD WITHOUT CONTRAST TECHNIQUE: Multiplanar, multiecho pulse sequences of the brain and surrounding structures were obtained without intravenous contrast. Angiographic images of the Circle of Willis were obtained using MRA technique without intravenous contrast. COMPARISON:  None Available. FINDINGS: MRI HEAD FINDINGS Brain: No acute infarction, hemorrhage, hydrocephalus, extra-axial collection or mass lesion. 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  Mild for age scattered T2/FLAIR hyperintensity white matter, compatible with chronic microvascular ischemic disease. Cerebral atrophy. Vascular: See below. Skull and upper cervical spine: Normal marrow signal. Sinuses/Orbits: Clear sinuses.  No acute orbital findings. Other: No mastoid effusions. MRA HEAD FINDINGS Anterior circulation: Bilateral intracranial ICAs, MCAs, and ACAs are patent without proximal hemodynamically significant stenosis. No aneurysm identified. Posterior circulation: Bilateral intradural vertebral arteries, basilar artery, and bilateral posterior arteries are patent without proximal hemodynamically significant stenosis. No aneurysm identified. IMPRESSION: 1. No evidence of acute intracranial abnormality. 2. No large vessel occlusion or proximal hemodynamically significant stenosis. Electronically Signed   By: Gilmore GORMAN Molt M.D.   On: 02/29/2024 21:50    Microbiology: Results for orders placed or performed during the hospital encounter of 02/29/24  Respiratory (~20 pathogens) panel by PCR     Status: None   Collection Time: 03/01/24  5:52 AM   Specimen: Nasopharyngeal Swab; Respiratory  Result Value Ref Range Status   Adenovirus NOT DETECTED NOT DETECTED Final   Coronavirus 229E NOT DETECTED NOT DETECTED Final    Comment: (NOTE) The Coronavirus on the Respiratory Panel, DOES NOT test for the novel  Coronavirus (2019 nCoV)    Coronavirus HKU1 NOT DETECTED NOT DETECTED Final   Coronavirus NL63  NOT DETECTED NOT DETECTED Final   Coronavirus OC43 NOT DETECTED NOT DETECTED Final   Metapneumovirus NOT DETECTED NOT DETECTED Final   Rhinovirus / Enterovirus NOT DETECTED NOT DETECTED Final   Influenza A NOT DETECTED NOT DETECTED Final   Influenza B NOT DETECTED NOT DETECTED Final   Parainfluenza Virus 1 NOT DETECTED NOT DETECTED Final   Parainfluenza Virus 2 NOT DETECTED NOT DETECTED Final   Parainfluenza Virus 3 NOT DETECTED NOT DETECTED Final   Parainfluenza Virus 4 NOT  DETECTED NOT DETECTED Final   Respiratory Syncytial Virus NOT DETECTED NOT DETECTED Final   Bordetella pertussis NOT DETECTED NOT DETECTED Final   Bordetella Parapertussis NOT DETECTED NOT DETECTED Final   Chlamydophila pneumoniae NOT DETECTED NOT DETECTED Final   Mycoplasma pneumoniae NOT DETECTED NOT DETECTED Final    Comment: Performed at [facility], [address] [place], [place] [address]  Resp panel by RT-PCR (RSV, Flu A&B, Covid) Anterior Nasal Swab     Status: None   Collection Time: 03/01/24  8:07 PM   Specimen: Anterior Nasal Swab  Result Value Ref Range Status   SARS Coronavirus 2 by RT PCR NEGATIVE NEGATIVE Final   Influenza A by PCR NEGATIVE NEGATIVE Final   Influenza B by PCR NEGATIVE NEGATIVE Final    Comment: (NOTE) The Xpert Xpress SARS-CoV-2/FLU/RSV plus assay is intended as an aid in the diagnosis of influenza from Nasopharyngeal swab specimens and should not be used as a sole basis for treatment. Nasal washings and aspirates are unacceptable for Xpert Xpress SARS-CoV-2/FLU/RSV testing.  Fact Sheet for Patients: BloggerCourse.com  Fact Sheet for Healthcare Providers: SeriousBroker.it  This test is not yet approved or cleared by the [place]  FDA and has been authorized for detection and/or diagnosis of SARS-CoV-2 by FDA under an Emergency Use Authorization (EUA). This EUA will remain in effect (meaning this test can be used) for the duration of the COVID-19 declaration under Section 564(b)(1) of the Act, 21 U.S.C. section 360bbb-3(b)(1), unless the authorization is terminated or revoked.     Resp Syncytial Virus by PCR NEGATIVE NEGATIVE Final    Comment: (NOTE) Fact Sheet for Patients: BloggerCourse.com  Fact Sheet for Healthcare Providers: SeriousBroker.it  This test is not yet approved or cleared by the [place]  FDA and has been  authorized for detection and/or diagnosis of SARS-CoV-2 by FDA under an Emergency Use Authorization (EUA). This EUA will remain in effect (meaning this test can be used) for the duration of the COVID-19 declaration under Section 564(b)(1) of the Act, 21 U.S.C. section 360bbb-3(b)(1), unless the authorization is terminated or revoked.  Performed at [facility], [address] [place], [place] [address]     Labs: CBC: Recent Labs  Lab 02/29/24 1903 03/01/24 0620 03/01/24 0630 03/02/24 0801 03/04/24 0446  WBC 10.5 9.6  --  6.8 7.6  NEUTROABS  --   --   --  4.8 5.4  HGB 13.6 11.8* 12.9* 11.6* 12.3*  HCT 41.9 37.0* 38.0* 35.7* 37.2*  MCV 88.2 90.0  --  88.1 87.1  PLT 119* 135*  --  141* 211   Basic Metabolic Panel: Recent Labs  Lab 02/29/24 1903 03/01/24 0620 03/01/24 0630 03/02/24 0801 03/04/24 0446  NA 140 141 141 140 139  K 3.6 3.3* 3.3* 3.8 3.5  CL 102 106  --  104 103  CO2 24 22  --  25 21*  GLUCOSE 156* 154*  --  126* 131*  BUN 15 13  --  10 11  CREATININE 1.47* 1.43*  --  1.34* 1.11  CALCIUM  9.3 9.0  --  8.8* 9.1  MG  --   --   --  2.5* 2.1  PHOS  --   --   --  3.1 3.0   Liver Function Tests: Recent Labs  Lab 02/29/24 1903 03/02/24 0801 03/04/24 0446  AST 17 18 16   ALT 13 14 18   ALKPHOS 63 56 58  BILITOT 1.8* 1.3* 1.0  PROT 7.8 6.4* 7.1  ALBUMIN 3.5 2.7* 2.7*   CBG: Recent Labs  Lab 02/29/24 1904 03/01/24 1245  GLUCAP 147* 170*    Discharge time spent: greater than 30 minutes.  Signed: MDALA-GAUSI, Kristine Tiley AGATHA, MD [facility] 03/05/2024

## 2024-03-05 NOTE — TOC Transition Note (Signed)
 Transition of Care [facility]) - Discharge Note   Patient Details  Name: [name] [name] MRN: [id number] Date of Birth: 11/24/1945  Transition [facility] Care [facility]) CM/SW Contact:  [name], LCSW Phone Number: 03/05/2024, 3:10 PM   Clinical Narrative:    Patient will Discharge to: [facility]  Discharge Date: 03/05/24 Family Notified: spouse  Transport By: ROME  Per MD patient is ready for discharge. RN, patient, and facility notified of discharge. Discharge Summary sent to facility. RN given number for [phone], Room 120-A. Ambulance transport requested for patient.   Clinical Social Worker signing off.  Montie Louder, MSW, LCSW Clinical Social Worker     Final next level of care: Skilled Nursing Facility Barriers to Discharge: Barriers Resolved   Patient Goals and CMS Choice Patient states their goals for this hospitalization and ongoing recovery are:: Rehab CMS Medicare.gov Compare Post Acute Care list provided to:: Patient Represenative (must comment) Choice offered to / list presented to : Spouse [place] ownership interest in [facility].provided to:: Spouse    Discharge Placement              Patient chooses bed at:  [facility]) Patient to be transferred to facility by: [facility] Name of family member notified: spouse Patient and family notified of of transfer: 03/05/24  Discharge Plan and Services Additional resources added to the After Visit Summary for   In-house Referral: Clinical Social Work   Post Acute Care Choice: Skilled Nursing Facility                               Social Drivers of Health (SDOH) Interventions SDOH Screenings   Food Insecurity: No Food Insecurity (03/01/2024)  Housing: Low Risk  (03/01/2024)  Transportation Needs: No Transportation Needs (03/01/2024)  Utilities: Not At Risk (03/01/2024)  Depression (PHQ2-9): Medium Risk (11/14/2020)  Social Connections: Moderately Integrated (03/01/2024)   Tobacco Use: Medium Risk (03/01/2024)     Readmission Risk Interventions     No data to display

## 2024-03-05 NOTE — TOC Progression Note (Addendum)
 Transition of Care [facility]) - Progression Note    Patient Details  Name: [name] [name] MRN: [id number] Date of Birth: 10/29/1945  Transition of Care [facility]) CM/SW Contact  [name], [place] Phone Number: 03/05/2024, 10:03 AM  Clinical Narrative:     [facility] - confirmed they have a bed - will submit request for authorization today, once seen by PT for updated PT note.     Expected Discharge Plan: Skilled Nursing Facility Barriers to Discharge: Continued Medical Work up, English as a second language teacher, SNF Pending bed offer  Expected Discharge Plan and Services In-house Referral: Clinical Social Work   Post Acute Care Choice: Skilled Nursing Facility Living arrangements for the past 2 months: Single Family Home Expected Discharge Date: 03/04/24                                     Social Determinants of Health (SDOH) Interventions SDOH Screenings   Food Insecurity: No Food Insecurity (03/01/2024)  Housing: Low Risk  (03/01/2024)  Transportation Needs: No Transportation Needs (03/01/2024)  Utilities: Not At Risk (03/01/2024)  Depression (PHQ2-9): Medium Risk (11/14/2020)  Social Connections: Moderately Integrated (03/01/2024)  Tobacco Use: Medium Risk (03/01/2024)    Readmission Risk Interventions     No data to display

## 2024-03-05 NOTE — TOC Progression Note (Signed)
 Transition of Care [facility]) - Progression Note    Patient Details  Name: [name] [name] MRN: [id number] Date of Birth: 1946/01/29  Transition of Care [facility]) CM/SW Contact  [name], [place] Phone Number: 03/05/2024, 2:33 PM  Clinical Narrative:     [facility] is approved 7/17 - 7/21  [facility] can admit today.  Expected Discharge Plan: Skilled Nursing Facility Barriers to Discharge: Barriers Resolved  Expected Discharge Plan and Services In-house Referral: Clinical Social Work   Post Acute Care Choice: Skilled Nursing Facility Living arrangements for the past 2 months: Single Family Home Expected Discharge Date: 03/05/24                                     Social Determinants of Health (SDOH) Interventions SDOH Screenings   Food Insecurity: No Food Insecurity (03/01/2024)  Housing: Low Risk  (03/01/2024)  Transportation Needs: No Transportation Needs (03/01/2024)  Utilities: Not At Risk (03/01/2024)  Depression (PHQ2-9): Medium Risk (11/14/2020)  Social Connections: Moderately Integrated (03/01/2024)  Tobacco Use: Medium Risk (03/01/2024)    Readmission Risk Interventions     No data to display

## 2024-03-05 NOTE — Progress Notes (Signed)
 Physical Therapy Treatment Patient Details Name: [name] [name] MRN: [id number] DOB: [birth year] Today's Date: 03/05/2024   History of Present Illness [name] [name] is a 78 y.o. male, who presented to the emergency department yesterday with weakness in his legs. He was unable to get off of the toilet. He was unable to walk in the hospital last night. He is also been confused over the past 24 hours. 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 Sheree in 2021. Increased c/o R chest pain 7/14 PM and 7/14 CXR shows stable left basilar atelectasis, will demonstrating no evidence of interval or acute cardiopulmonary process, including no evidence of infiltrate, edema, effusion, or pneumothorax. 7/14 EKG shows sinus bradycardia with heart rate 57, normal intervals, and no evidence of T wave or ST changes, including no evidence of ST elevation. Past medical history: Chronic back pain, GERD, HLD, HTN, AAA s/p repair with endo leak, Dementia.    PT Comments  Pt is progressing towards goals. Currently pt is Mod A for bed mobility, Min to Mod A for sit to stand and Min A for 30 ft of gait with assist navigating AD and balance. Pt is a high risk for falls. Pt has supportive family at home. Currently requires 24/7 physical assistance and will require ramp/stair lift for home set up. Due to pt current functional status, home set up and available assistance at home recommending skilled physical therapy services < 3 hours/day in order to address strength, balance and functional mobility to decrease risk for falls, injury, immobility, skin break down and re-hospitalization.      If plan is discharge home, recommend the following: Assist for transportation;Help with stairs or ramp for entrance;Assistance with cooking/housework;Supervision due to  cognitive status   Can travel by private vehicle     No  Equipment Recommendations  BSC/3in1 (ramp or chair lift for stairs at home.)       Precautions / Restrictions Precautions Precautions: Fall Recall of Precautions/Restrictions: Impaired Precaution/Restrictions Comments: history of dementia, R chest pain 7/14 PM and 7/15 AM. Restrictions Weight Bearing Restrictions Per Provider Order: No     Mobility  Bed Mobility Overal bed mobility: Needs Assistance Bed Mobility: Supine to Sit Rolling: Min assist, Used rails   Supine to sit: Min assist     General bed mobility comments: Min A to get trunk to mid line and verbal cues throughout for sequencing in order to decrease level of physical assist required.    Transfers Overall transfer level: Needs assistance Equipment used: Rolling walker (2 wheels) Transfers: Sit to/from Stand, Bed to chair/wheelchair/BSC Sit to Stand: Mod assist, Min assist   Step pivot transfers: Min assist, Mod assist       General transfer comment: mod A initial stand from EOB, Min A for second attempt from recliner. Verbal cues for sequencing    Ambulation/Gait Ambulation/Gait assistance: Min assist Gait Distance (Feet): 30 Feet Assistive device: Rolling walker (2 wheels) Gait Pattern/deviations: Step-through pattern, Decreased stride length, Trunk flexed, Shuffle Gait velocity: decreased Gait velocity interpretation: <1.31 ft/sec, indicative of household ambulator   General Gait Details: Min A for RW management/stability and balance      Balance Overall balance assessment: Needs assistance Sitting-balance support: Bilateral upper extremity supported, Feet supported Sitting balance-Leahy Scale: Fair Sitting balance - Comments: reliant on BUE  support at EOB Postural control: Posterior lean Standing balance support: Bilateral upper extremity supported, During functional activity, Reliant on assistive device for balance Standing  balance-Leahy Scale: Poor          Communication Communication Communication: No apparent difficulties  Cognition Arousal: Alert Behavior During Therapy: WFL for tasks assessed/performed   PT - Cognitive impairments: Safety/Judgement, History of cognitive impairments     Following commands: Intact      Cueing Cueing Techniques: Verbal cues     General Comments General comments (skin integrity, edema, etc.): No signs/symptoms of cardiac/respiratory distress during session      Pertinent Vitals/Pain Pain Assessment Pain Assessment: Faces Faces Pain Scale: Hurts little more Pain Location: bed Pain Descriptors / Indicators: Discomfort, Grimacing, Guarding, Sharp, Sore Pain Intervention(s): Limited activity within patient's tolerance, Premedicated before session     PT Goals (current goals can now be found in the care plan section) Acute Rehab PT Goals Patient Stated Goal: Less R chest pain and to move around better so I can go home. PT Goal Formulation: With patient Time For Goal Achievement: 03/16/24 Potential to Achieve Goals: Good Progress towards PT goals: Progressing toward goals    Frequency    Min 3X/week      PT Plan  Continue with current POC        AM-PAC PT 6 Clicks Mobility   Outcome Measure  Help needed turning from your back to your side while in a flat bed without using bedrails?: A Little Help needed moving from lying on your back to sitting on the side of a flat bed without using bedrails?: A Lot Help needed moving to and from a bed to a chair (including a wheelchair)?: A Lot Help needed standing up from a chair using your arms (e.g., wheelchair or bedside chair)?: A Lot Help needed to walk in hospital room?: A Little Help needed climbing 3-5 steps with a railing? : Total 6 Click Score: 13    End of Session Equipment Utilized During Treatment: Gait belt Activity Tolerance: Patient tolerated treatment well Patient left: with call  bell/phone within reach;with family/visitor present;in chair;with chair alarm set Nurse Communication: Mobility status PT Visit Diagnosis: Unsteadiness on feet (R26.81);Muscle weakness (generalized) (M62.81)     Time: 8977-8954 PT Time Calculation (min) (ACUTE ONLY): 23 min  Charges:    $Therapeutic Activity: 23-37 mins PT General Charges $$ ACUTE PT VISIT: 1 Visit                     Dorothyann Maier, DPT, CLT  Acute Rehabilitation Services Office: [phone] (Secure chat preferred)    Dorothyann VEAR Maier 03/05/2024, 10:48 AM

## 2024-03-06 DIAGNOSIS — E569 Vitamin deficiency, unspecified: Secondary | ICD-10-CM | POA: Diagnosis not present

## 2024-03-06 DIAGNOSIS — Z8673 Personal history of transient ischemic attack (TIA), and cerebral infarction without residual deficits: Secondary | ICD-10-CM | POA: Diagnosis not present

## 2024-03-06 DIAGNOSIS — K219 Gastro-esophageal reflux disease without esophagitis: Secondary | ICD-10-CM | POA: Diagnosis not present

## 2024-03-06 DIAGNOSIS — E1122 Type 2 diabetes mellitus with diabetic chronic kidney disease: Secondary | ICD-10-CM | POA: Diagnosis not present

## 2024-03-06 DIAGNOSIS — G8929 Other chronic pain: Secondary | ICD-10-CM | POA: Diagnosis not present

## 2024-03-06 DIAGNOSIS — I1310 Hypertensive heart and chronic kidney disease without heart failure, with stage 1 through stage 4 chronic kidney disease, or unspecified chronic kidney disease: Secondary | ICD-10-CM | POA: Diagnosis not present

## 2024-03-06 DIAGNOSIS — E119 Type 2 diabetes mellitus without complications: Secondary | ICD-10-CM | POA: Diagnosis not present

## 2024-03-06 DIAGNOSIS — E785 Hyperlipidemia, unspecified: Secondary | ICD-10-CM | POA: Diagnosis not present

## 2024-03-06 DIAGNOSIS — I714 Abdominal aortic aneurysm, without rupture, unspecified: Secondary | ICD-10-CM | POA: Diagnosis not present

## 2024-03-06 DIAGNOSIS — N189 Chronic kidney disease, unspecified: Secondary | ICD-10-CM | POA: Diagnosis not present

## 2024-03-06 DIAGNOSIS — R531 Weakness: Secondary | ICD-10-CM | POA: Diagnosis not present

## 2024-03-06 DIAGNOSIS — Z7409 Other reduced mobility: Secondary | ICD-10-CM | POA: Diagnosis not present

## 2024-03-07 DIAGNOSIS — R5381 Other malaise: Secondary | ICD-10-CM | POA: Diagnosis not present

## 2024-03-07 DIAGNOSIS — R531 Weakness: Secondary | ICD-10-CM | POA: Diagnosis not present

## 2024-03-07 DIAGNOSIS — K5900 Constipation, unspecified: Secondary | ICD-10-CM | POA: Diagnosis not present

## 2024-03-07 DIAGNOSIS — Z7409 Other reduced mobility: Secondary | ICD-10-CM | POA: Diagnosis not present

## 2024-03-08 DIAGNOSIS — R5381 Other malaise: Secondary | ICD-10-CM | POA: Diagnosis not present

## 2024-03-08 DIAGNOSIS — Z7409 Other reduced mobility: Secondary | ICD-10-CM | POA: Diagnosis not present

## 2024-03-08 DIAGNOSIS — I1310 Hypertensive heart and chronic kidney disease without heart failure, with stage 1 through stage 4 chronic kidney disease, or unspecified chronic kidney disease: Secondary | ICD-10-CM | POA: Diagnosis not present

## 2024-03-08 DIAGNOSIS — Z8673 Personal history of transient ischemic attack (TIA), and cerebral infarction without residual deficits: Secondary | ICD-10-CM | POA: Diagnosis not present

## 2024-03-08 DIAGNOSIS — R531 Weakness: Secondary | ICD-10-CM | POA: Diagnosis not present

## 2024-03-09 DIAGNOSIS — Z7409 Other reduced mobility: Secondary | ICD-10-CM | POA: Diagnosis not present

## 2024-03-09 DIAGNOSIS — R531 Weakness: Secondary | ICD-10-CM | POA: Diagnosis not present

## 2024-03-09 DIAGNOSIS — Z8673 Personal history of transient ischemic attack (TIA), and cerebral infarction without residual deficits: Secondary | ICD-10-CM | POA: Diagnosis not present

## 2024-03-09 DIAGNOSIS — R5381 Other malaise: Secondary | ICD-10-CM | POA: Diagnosis not present

## 2024-03-10 DIAGNOSIS — Z7409 Other reduced mobility: Secondary | ICD-10-CM | POA: Diagnosis not present

## 2024-03-10 DIAGNOSIS — I10 Essential (primary) hypertension: Secondary | ICD-10-CM | POA: Diagnosis not present

## 2024-03-10 DIAGNOSIS — R5381 Other malaise: Secondary | ICD-10-CM | POA: Diagnosis not present

## 2024-03-10 DIAGNOSIS — R531 Weakness: Secondary | ICD-10-CM | POA: Diagnosis not present

## 2024-03-11 DIAGNOSIS — R531 Weakness: Secondary | ICD-10-CM | POA: Diagnosis not present

## 2024-03-11 DIAGNOSIS — E1140 Type 2 diabetes mellitus with diabetic neuropathy, unspecified: Secondary | ICD-10-CM | POA: Diagnosis not present

## 2024-03-11 DIAGNOSIS — Z7409 Other reduced mobility: Secondary | ICD-10-CM | POA: Diagnosis not present

## 2024-03-11 DIAGNOSIS — R2689 Other abnormalities of gait and mobility: Secondary | ICD-10-CM | POA: Diagnosis not present

## 2024-03-11 DIAGNOSIS — G9340 Encephalopathy, unspecified: Secondary | ICD-10-CM | POA: Diagnosis not present

## 2024-03-11 DIAGNOSIS — I715 Thoracoabdominal aortic aneurysm, ruptured, unspecified: Secondary | ICD-10-CM | POA: Diagnosis not present

## 2024-03-11 DIAGNOSIS — I10 Essential (primary) hypertension: Secondary | ICD-10-CM | POA: Diagnosis not present

## 2024-03-14 DIAGNOSIS — I10 Essential (primary) hypertension: Secondary | ICD-10-CM | POA: Diagnosis not present

## 2024-03-14 DIAGNOSIS — N1831 Chronic kidney disease, stage 3a: Secondary | ICD-10-CM | POA: Diagnosis not present

## 2024-03-14 DIAGNOSIS — R531 Weakness: Secondary | ICD-10-CM | POA: Diagnosis not present

## 2024-03-14 DIAGNOSIS — D649 Anemia, unspecified: Secondary | ICD-10-CM | POA: Diagnosis not present

## 2024-03-14 DIAGNOSIS — Z7409 Other reduced mobility: Secondary | ICD-10-CM | POA: Diagnosis not present

## 2024-03-16 DIAGNOSIS — I10 Essential (primary) hypertension: Secondary | ICD-10-CM | POA: Diagnosis not present

## 2024-03-16 DIAGNOSIS — Z7409 Other reduced mobility: Secondary | ICD-10-CM | POA: Diagnosis not present

## 2024-03-16 DIAGNOSIS — N1831 Chronic kidney disease, stage 3a: Secondary | ICD-10-CM | POA: Diagnosis not present

## 2024-03-16 DIAGNOSIS — D649 Anemia, unspecified: Secondary | ICD-10-CM | POA: Diagnosis not present

## 2024-03-16 DIAGNOSIS — R531 Weakness: Secondary | ICD-10-CM | POA: Diagnosis not present

## 2024-03-19 DIAGNOSIS — E782 Mixed hyperlipidemia: Secondary | ICD-10-CM | POA: Diagnosis not present

## 2024-03-19 DIAGNOSIS — E1136 Type 2 diabetes mellitus with diabetic cataract: Secondary | ICD-10-CM | POA: Diagnosis not present

## 2024-03-19 DIAGNOSIS — N1831 Chronic kidney disease, stage 3a: Secondary | ICD-10-CM | POA: Diagnosis not present

## 2024-03-19 DIAGNOSIS — E1140 Type 2 diabetes mellitus with diabetic neuropathy, unspecified: Secondary | ICD-10-CM | POA: Diagnosis not present

## 2024-03-24 DIAGNOSIS — E1122 Type 2 diabetes mellitus with diabetic chronic kidney disease: Secondary | ICD-10-CM | POA: Diagnosis not present

## 2024-03-24 DIAGNOSIS — I129 Hypertensive chronic kidney disease with stage 1 through stage 4 chronic kidney disease, or unspecified chronic kidney disease: Secondary | ICD-10-CM | POA: Diagnosis not present

## 2024-03-24 DIAGNOSIS — Z8673 Personal history of transient ischemic attack (TIA), and cerebral infarction without residual deficits: Secondary | ICD-10-CM | POA: Diagnosis not present

## 2024-03-24 DIAGNOSIS — Z7984 Long term (current) use of oral hypoglycemic drugs: Secondary | ICD-10-CM | POA: Diagnosis not present

## 2024-03-24 DIAGNOSIS — M549 Dorsalgia, unspecified: Secondary | ICD-10-CM | POA: Diagnosis not present

## 2024-03-24 DIAGNOSIS — N1831 Chronic kidney disease, stage 3a: Secondary | ICD-10-CM | POA: Diagnosis not present

## 2024-03-24 DIAGNOSIS — Z981 Arthrodesis status: Secondary | ICD-10-CM | POA: Diagnosis not present

## 2024-03-24 DIAGNOSIS — I7090 Unspecified atherosclerosis: Secondary | ICD-10-CM | POA: Diagnosis not present

## 2024-03-24 DIAGNOSIS — I716 Thoracoabdominal aortic aneurysm, without rupture, unspecified: Secondary | ICD-10-CM | POA: Diagnosis not present

## 2024-03-24 DIAGNOSIS — D631 Anemia in chronic kidney disease: Secondary | ICD-10-CM | POA: Diagnosis not present

## 2024-03-24 DIAGNOSIS — K5900 Constipation, unspecified: Secondary | ICD-10-CM | POA: Diagnosis not present

## 2024-03-24 DIAGNOSIS — Z7982 Long term (current) use of aspirin: Secondary | ICD-10-CM | POA: Diagnosis not present

## 2024-03-24 DIAGNOSIS — Z9181 History of falling: Secondary | ICD-10-CM | POA: Diagnosis not present

## 2024-03-24 DIAGNOSIS — N179 Acute kidney failure, unspecified: Secondary | ICD-10-CM | POA: Diagnosis not present

## 2024-03-24 DIAGNOSIS — E785 Hyperlipidemia, unspecified: Secondary | ICD-10-CM | POA: Diagnosis not present

## 2024-03-24 DIAGNOSIS — G9340 Encephalopathy, unspecified: Secondary | ICD-10-CM | POA: Diagnosis not present

## 2024-03-24 DIAGNOSIS — Z7409 Other reduced mobility: Secondary | ICD-10-CM | POA: Diagnosis not present

## 2024-03-24 DIAGNOSIS — Z9089 Acquired absence of other organs: Secondary | ICD-10-CM | POA: Diagnosis not present

## 2024-03-24 DIAGNOSIS — G8929 Other chronic pain: Secondary | ICD-10-CM | POA: Diagnosis not present

## 2024-03-24 DIAGNOSIS — K219 Gastro-esophageal reflux disease without esophagitis: Secondary | ICD-10-CM | POA: Diagnosis not present

## 2024-03-24 DIAGNOSIS — T82330D Leakage of aortic (bifurcation) graft (replacement), subsequent encounter: Secondary | ICD-10-CM | POA: Diagnosis not present

## 2024-03-31 DIAGNOSIS — M549 Dorsalgia, unspecified: Secondary | ICD-10-CM | POA: Diagnosis not present

## 2024-03-31 DIAGNOSIS — Z7409 Other reduced mobility: Secondary | ICD-10-CM | POA: Diagnosis not present

## 2024-03-31 DIAGNOSIS — Z9181 History of falling: Secondary | ICD-10-CM | POA: Diagnosis not present

## 2024-03-31 DIAGNOSIS — I7090 Unspecified atherosclerosis: Secondary | ICD-10-CM | POA: Diagnosis not present

## 2024-03-31 DIAGNOSIS — G9340 Encephalopathy, unspecified: Secondary | ICD-10-CM | POA: Diagnosis not present

## 2024-03-31 DIAGNOSIS — Z7982 Long term (current) use of aspirin: Secondary | ICD-10-CM | POA: Diagnosis not present

## 2024-03-31 DIAGNOSIS — I716 Thoracoabdominal aortic aneurysm, without rupture, unspecified: Secondary | ICD-10-CM | POA: Diagnosis not present

## 2024-03-31 DIAGNOSIS — K5900 Constipation, unspecified: Secondary | ICD-10-CM | POA: Diagnosis not present

## 2024-03-31 DIAGNOSIS — I129 Hypertensive chronic kidney disease with stage 1 through stage 4 chronic kidney disease, or unspecified chronic kidney disease: Secondary | ICD-10-CM | POA: Diagnosis not present

## 2024-03-31 DIAGNOSIS — K219 Gastro-esophageal reflux disease without esophagitis: Secondary | ICD-10-CM | POA: Diagnosis not present

## 2024-03-31 DIAGNOSIS — Z8673 Personal history of transient ischemic attack (TIA), and cerebral infarction without residual deficits: Secondary | ICD-10-CM | POA: Diagnosis not present

## 2024-03-31 DIAGNOSIS — D631 Anemia in chronic kidney disease: Secondary | ICD-10-CM | POA: Diagnosis not present

## 2024-03-31 DIAGNOSIS — N179 Acute kidney failure, unspecified: Secondary | ICD-10-CM | POA: Diagnosis not present

## 2024-03-31 DIAGNOSIS — Z7984 Long term (current) use of oral hypoglycemic drugs: Secondary | ICD-10-CM | POA: Diagnosis not present

## 2024-03-31 DIAGNOSIS — Z9089 Acquired absence of other organs: Secondary | ICD-10-CM | POA: Diagnosis not present

## 2024-03-31 DIAGNOSIS — E785 Hyperlipidemia, unspecified: Secondary | ICD-10-CM | POA: Diagnosis not present

## 2024-03-31 DIAGNOSIS — E1122 Type 2 diabetes mellitus with diabetic chronic kidney disease: Secondary | ICD-10-CM | POA: Diagnosis not present

## 2024-03-31 DIAGNOSIS — Z981 Arthrodesis status: Secondary | ICD-10-CM | POA: Diagnosis not present

## 2024-03-31 DIAGNOSIS — T82330D Leakage of aortic (bifurcation) graft (replacement), subsequent encounter: Secondary | ICD-10-CM | POA: Diagnosis not present

## 2024-03-31 DIAGNOSIS — N1831 Chronic kidney disease, stage 3a: Secondary | ICD-10-CM | POA: Diagnosis not present

## 2024-03-31 DIAGNOSIS — G8929 Other chronic pain: Secondary | ICD-10-CM | POA: Diagnosis not present

## 2024-04-03 DIAGNOSIS — K219 Gastro-esophageal reflux disease without esophagitis: Secondary | ICD-10-CM | POA: Diagnosis not present

## 2024-04-03 DIAGNOSIS — D631 Anemia in chronic kidney disease: Secondary | ICD-10-CM | POA: Diagnosis not present

## 2024-04-03 DIAGNOSIS — Z981 Arthrodesis status: Secondary | ICD-10-CM | POA: Diagnosis not present

## 2024-04-03 DIAGNOSIS — I716 Thoracoabdominal aortic aneurysm, without rupture, unspecified: Secondary | ICD-10-CM | POA: Diagnosis not present

## 2024-04-03 DIAGNOSIS — I7090 Unspecified atherosclerosis: Secondary | ICD-10-CM | POA: Diagnosis not present

## 2024-04-03 DIAGNOSIS — Z9089 Acquired absence of other organs: Secondary | ICD-10-CM | POA: Diagnosis not present

## 2024-04-03 DIAGNOSIS — G9340 Encephalopathy, unspecified: Secondary | ICD-10-CM | POA: Diagnosis not present

## 2024-04-03 DIAGNOSIS — N179 Acute kidney failure, unspecified: Secondary | ICD-10-CM | POA: Diagnosis not present

## 2024-04-03 DIAGNOSIS — E1122 Type 2 diabetes mellitus with diabetic chronic kidney disease: Secondary | ICD-10-CM | POA: Diagnosis not present

## 2024-04-03 DIAGNOSIS — Z7409 Other reduced mobility: Secondary | ICD-10-CM | POA: Diagnosis not present

## 2024-04-03 DIAGNOSIS — Z8673 Personal history of transient ischemic attack (TIA), and cerebral infarction without residual deficits: Secondary | ICD-10-CM | POA: Diagnosis not present

## 2024-04-03 DIAGNOSIS — M549 Dorsalgia, unspecified: Secondary | ICD-10-CM | POA: Diagnosis not present

## 2024-04-03 DIAGNOSIS — Z7982 Long term (current) use of aspirin: Secondary | ICD-10-CM | POA: Diagnosis not present

## 2024-04-03 DIAGNOSIS — I129 Hypertensive chronic kidney disease with stage 1 through stage 4 chronic kidney disease, or unspecified chronic kidney disease: Secondary | ICD-10-CM | POA: Diagnosis not present

## 2024-04-03 DIAGNOSIS — Z7984 Long term (current) use of oral hypoglycemic drugs: Secondary | ICD-10-CM | POA: Diagnosis not present

## 2024-04-03 DIAGNOSIS — N1831 Chronic kidney disease, stage 3a: Secondary | ICD-10-CM | POA: Diagnosis not present

## 2024-04-03 DIAGNOSIS — Z9181 History of falling: Secondary | ICD-10-CM | POA: Diagnosis not present

## 2024-04-03 DIAGNOSIS — T82330D Leakage of aortic (bifurcation) graft (replacement), subsequent encounter: Secondary | ICD-10-CM | POA: Diagnosis not present

## 2024-04-03 DIAGNOSIS — E785 Hyperlipidemia, unspecified: Secondary | ICD-10-CM | POA: Diagnosis not present

## 2024-04-03 DIAGNOSIS — G8929 Other chronic pain: Secondary | ICD-10-CM | POA: Diagnosis not present

## 2024-04-03 DIAGNOSIS — K5900 Constipation, unspecified: Secondary | ICD-10-CM | POA: Diagnosis not present

## 2024-04-07 DIAGNOSIS — K5900 Constipation, unspecified: Secondary | ICD-10-CM | POA: Diagnosis not present

## 2024-04-07 DIAGNOSIS — Z7984 Long term (current) use of oral hypoglycemic drugs: Secondary | ICD-10-CM | POA: Diagnosis not present

## 2024-04-07 DIAGNOSIS — E785 Hyperlipidemia, unspecified: Secondary | ICD-10-CM | POA: Diagnosis not present

## 2024-04-07 DIAGNOSIS — I716 Thoracoabdominal aortic aneurysm, without rupture, unspecified: Secondary | ICD-10-CM | POA: Diagnosis not present

## 2024-04-07 DIAGNOSIS — N1831 Chronic kidney disease, stage 3a: Secondary | ICD-10-CM | POA: Diagnosis not present

## 2024-04-07 DIAGNOSIS — I129 Hypertensive chronic kidney disease with stage 1 through stage 4 chronic kidney disease, or unspecified chronic kidney disease: Secondary | ICD-10-CM | POA: Diagnosis not present

## 2024-04-07 DIAGNOSIS — Z7409 Other reduced mobility: Secondary | ICD-10-CM | POA: Diagnosis not present

## 2024-04-07 DIAGNOSIS — D631 Anemia in chronic kidney disease: Secondary | ICD-10-CM | POA: Diagnosis not present

## 2024-04-07 DIAGNOSIS — Z9089 Acquired absence of other organs: Secondary | ICD-10-CM | POA: Diagnosis not present

## 2024-04-07 DIAGNOSIS — N179 Acute kidney failure, unspecified: Secondary | ICD-10-CM | POA: Diagnosis not present

## 2024-04-07 DIAGNOSIS — E1122 Type 2 diabetes mellitus with diabetic chronic kidney disease: Secondary | ICD-10-CM | POA: Diagnosis not present

## 2024-04-07 DIAGNOSIS — I7090 Unspecified atherosclerosis: Secondary | ICD-10-CM | POA: Diagnosis not present

## 2024-04-07 DIAGNOSIS — Z8673 Personal history of transient ischemic attack (TIA), and cerebral infarction without residual deficits: Secondary | ICD-10-CM | POA: Diagnosis not present

## 2024-04-07 DIAGNOSIS — Z7982 Long term (current) use of aspirin: Secondary | ICD-10-CM | POA: Diagnosis not present

## 2024-04-07 DIAGNOSIS — G8929 Other chronic pain: Secondary | ICD-10-CM | POA: Diagnosis not present

## 2024-04-07 DIAGNOSIS — T82330D Leakage of aortic (bifurcation) graft (replacement), subsequent encounter: Secondary | ICD-10-CM | POA: Diagnosis not present

## 2024-04-07 DIAGNOSIS — K219 Gastro-esophageal reflux disease without esophagitis: Secondary | ICD-10-CM | POA: Diagnosis not present

## 2024-04-07 DIAGNOSIS — Z981 Arthrodesis status: Secondary | ICD-10-CM | POA: Diagnosis not present

## 2024-04-07 DIAGNOSIS — M549 Dorsalgia, unspecified: Secondary | ICD-10-CM | POA: Diagnosis not present

## 2024-04-07 DIAGNOSIS — Z9181 History of falling: Secondary | ICD-10-CM | POA: Diagnosis not present

## 2024-04-07 DIAGNOSIS — G9340 Encephalopathy, unspecified: Secondary | ICD-10-CM | POA: Diagnosis not present

## 2024-04-08 ENCOUNTER — Encounter: Payer: Self-pay | Admitting: Vascular Surgery

## 2024-04-08 ENCOUNTER — Ambulatory Visit: Attending: Vascular Surgery | Admitting: Vascular Surgery

## 2024-04-08 VITALS — BP 130/76 | HR 57 | Temp 98.3°F | Resp 20 | Ht 70.0 in | Wt 193.0 lb

## 2024-04-08 DIAGNOSIS — I7103 Dissection of thoracoabdominal aorta: Secondary | ICD-10-CM | POA: Diagnosis not present

## 2024-04-08 NOTE — H&P (View-Only) (Signed)
 Patient ID: [name] [name], male   DOB: 02/21/1946, 78 y.o.   MRN: [id number]  Reason for Consult: Follow-up   Referred by [name], MD  Subjective:     HPI:  [name] [name] is a 78 y.o. 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  This was performed in 2021.  Recently he was admitted with bilateral lower extremity weakness ultimately was found to have diffuse encephalopathy.  He has now mostly recovered after spending time in a nursing facility.  He continues to walk with the help of a walker.  He is here today to discuss results of his most recent CT angio.  Past Medical History:  Diagnosis Date   Chronic back pain    stenosis   Enlarged prostate    GERD (gastroesophageal reflux disease)    occasionally will take a zantac(maybe once a month)   History of colon polyps    History of kidney stones    History of stress test    done 10 yrs. ago, as a baseline    Hyperlipidemia    takes Crestor  daily   Hypertension    takes Amlodipine  daily and Lotensin  as well   Stroke [facility])    History reviewed. No pertinent family history. Past Surgical History:  Procedure Laterality Date   ANTERIOR LAT LUMBAR FUSION Left 12/15/2014   Procedure: ANTERIOR LATERAL LUMBAR FUSION 1 LEVEL;  Surgeon: Oneil Priestly, MD;  Location: [facility] OR;  Service: Orthopedics;  Laterality: Left;  Left sided lateral lumbar interbody fusion, lumbar 3-4, posterior spinal fusion, lumbar 3-4 with instrumentation.   COLONOSCOPY     ESOPHAGOGASTRODUODENOSCOPY     fatty tissue removed from stomach     LUMBAR LAMINECTOMY/DECOMPRESSION MICRODISCECTOMY N/A 07/08/2013   Procedure: LUMBAR LAMINECTOMY/DECOMPRESSION MICRODISCECTOMY;  Surgeon: Oneil Rodgers Priestly, MD;  Location: [facility] OR;  Service: Orthopedics;  Laterality: N/A;  Lumbar 3-4, lumbar 4-5 decompression   RADIOLOGY WITH ANESTHESIA N/A 10/22/2019   Procedure: MRI WITH ANESTHESIA OF LUMBAR SPINE  WITHOUT CONTRAST;  Surgeon: Radiologist, Medication, MD;  Location: [facility] OR;  Service: Radiology;  Laterality: N/A;   right ankle surgery     as child    THORACIC AORTIC ENDOVASCULAR STENT GRAFT N/A 10/15/2019   Procedure: THORACIC AORTIC ENDOVASCULAR STENT GRAFT;  Surgeon: Sheree Penne Bruckner, MD;  Location: [facility] OR;  Service: Vascular;  Laterality: N/A;   TONSILLECTOMY     as a child    Short Social History:  Social History   Tobacco Use   Smoking status: Former    Current packs/day: 0.25    Average packs/day: 0.3 packs/day for 40.0 years (10.0 ttl pk-yrs)    Types: Cigarettes   Smokeless tobacco: Never   Tobacco comments:    none since 06/24/13  Substance Use Topics   Alcohol use: No    Allergies  Allergen Reactions   Lyrica [Pregabalin] Nausea Only and Other (See Comments)    Hallucinations Dizziness    Sulfa Antibiotics Other (See Comments)    Unknown reaction    Current Outpatient Medications  Medication Sig Dispense Refill   acetaminophen  (TYLENOL ) 500 MG tablet Take 1,000 mg by mouth 2 (two) times daily.     amLODipine  (NORVASC ) 10 MG tablet Take 10 mg by mouth daily.     aspirin  EC 81 MG tablet Take 81 mg by mouth daily.     carvedilol  (COREG ) 25 MG tablet Take 1 tablet (25 mg total) by mouth 2 (  two) times daily with a meal. 60 tablet 0   hydrALAZINE  (APRESOLINE ) 10 MG tablet Take 1 tablet (10 mg total) by mouth 3 (three) times daily. 180 tablet 0   memantine  (NAMENDA ) 10 MG tablet TAKE 1 TABLET BY MOUTH TWICE DAILY 60 tablet 10   metFORMIN (GLUCOPHAGE) 500 MG tablet Take 500 mg by mouth 2 (two) times daily.     Multiple Vitamins-Minerals (MENS 50+ MULTIVITAMIN) TABS Take 1 tablet by mouth daily.     pantoprazole  (PROTONIX ) 40 MG tablet Take 1 tablet (40 mg total) by mouth daily. 30 tablet 0   rosuvastatin  (CRESTOR ) 5 MG tablet Take 5 mg by mouth at bedtime.     tamsulosin  (FLOMAX ) 0.4 MG CAPS capsule Take 1 capsule (0.4 mg total) by mouth daily. 30 capsule 0    No current facility-administered medications for this visit.    Review of Systems  Constitutional: Positive for fatigue.  HENT: HENT negative.  Eyes: Eyes negative.  Cardiovascular: Cardiovascular negative.  GI: Gastrointestinal negative.  Musculoskeletal: Positive for gait problem.  Neurological:       Weakness Hematologic: Hematologic/lymphatic negative.  Psychiatric: Psychiatric negative.        Objective:  Objective  Vitals:   04/08/24 1602  BP: 130/76  Pulse: (!) 57  Resp: 20  Temp: 98.3 F (36.8 C)  SpO2: 98%     Physical Exam HENT:     Head: Normocephalic.     Nose: Nose normal.     Mouth/Throat:     Mouth: Mucous membranes are moist.  Eyes:     Pupils: Pupils are equal, round, and reactive to light.  Cardiovascular:     Rate and Rhythm: Normal rate.     Pulses:          Femoral pulses are 2+ on the right side and 2+ on the left side.      Popliteal pulses are 2+ on the right side and 2+ on the left side.  Pulmonary:     Effort: Pulmonary effort is normal.  Abdominal:     General: Abdomen is flat.     Palpations: Abdomen is soft. There is mass.  Musculoskeletal:        General: Normal range of motion.     Cervical back: Normal range of motion and neck supple.     Right lower leg: No edema.     Left lower leg: No edema.  Skin:    General: Skin is warm.     Capillary Refill: Capillary refill takes less than 2 seconds.  Neurological:     General: No focal deficit present.     Mental Status: He is alert.  Psychiatric:        Mood and Affect: Mood normal.        Thought Content: Thought content normal.        Judgment: Judgment normal.     Data: CTA IMPRESSION: 1. No acute intracranial CT findings. Stable atrophy and small-vessel changes. 2. Interval lens extractions/replacements with dysconjugate gaze. 3. Endoluminal aortic stent graft again extends from the junction of the isthmus and descending segment ending in the hiatal  segment, with increased type 1A endoleak from the posterior aspect of the proximal landing zone, and at least a small type 2 contribution from an intercostal artery. Contrast is seen in the excluded aneurysm sac along the anteromedial aspect to the level of T7, and the descending aorta has increased in size from 4.3 cm in 2021 to 5.0 x 5.4  cm today. 4. Abdominal aortic dissection again extends from just below the aortic stent graft into the infrarenal segment terminating above the aortic bifurcation. The false lumen is patent but with increased near circumferential thrombus since the prior studies. 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 Vascular surgery referral recommended unless already done. 5. Dissection flap again extends into the SMA up to the first main bifurcation, the true lumen supplies about 40% of the overall vessel diameter with the false lumen up to 60%. The false lumen largely supplies the main right-sided SMA division and the true lumen supplies the remainder. 6. Small layering left pleural effusion with fluid density slightly above simple fluid inferiorly. Pleural space hemorrhage would be expected to be denser and more heterogeneous but the pleural effusion etiology is not readily apparent. 7. Mild cardiomegaly, but less than in 2021, with small pericardial effusion anteriorly. 8. IVC and hepatic vein reflux which may be seen with right heart dysfunction or tricuspid regurgitation. 9. Severe prostatomegaly including the median lobe, with moderate impression into the bladder. 10. Right inguinal hernia containing a short loop of the distal small bowel without evidence of hernia strangulation or incarceration. No upstream small bowel obstruction. 11. Small left inguinal fat hernia. 12. Uncomplicated diverticulosis. 13. Aortic atherosclerosis.       Assessment/Plan:     78 year old male status post repair of type B aortic  dissection for aneurysmal degeneration in the acute phase.  He was recently admitted and found to have aneurysmal degeneration of the perivisceral aorta up to 5.7 cm infrarenal and normal again at the level of the aortic bifurcation with patent bilateral common femoral and iliac arteries.  I have reviewed his CTA extensively and he appears to be a suitable candidate for [facility] repair which would leave him covered from just distal to the subclavian artery up to the level of the hypogastric arteries.  CT scan was reviewed with the patient and his daughter today at bedside.  We discussed the options being open repair versus endovascular versus no repair at all.  We discussed that he has between 5 and 10% risk of rupture within the year of which she would most certainly not survive given the complexity of repair required.  We discussed the procedural details of TAMBE as well as specific risks of spinal cord ischemia, stroke, bowel or kidney ischemia and injury to access arteries including the axillary and the bilateral common femorals.  At this time patient and his daughter desire more time to consider their options and will call to schedule.  If necessary we could have further discussion via phone call or in [place].  Patient would require spinal drain to decrease risk of spinal cord ischemia though he does have patent left subclavian and bilateral hypogastric arteries.  All questions were answered and they demonstrate good understanding.     Penne Lonni Colorado MD [facility]

## 2024-04-08 NOTE — Progress Notes (Signed)
 Patient ID: [name] [name], male   DOB: 02/21/1946, 78 y.o.   MRN: [id number]  Reason for Consult: Follow-up   Referred by [name], MD  Subjective:     HPI:  [name] [name] is a 78 y.o. 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  This was performed in 2021.  Recently he was admitted with bilateral lower extremity weakness ultimately was found to have diffuse encephalopathy.  He has now mostly recovered after spending time in a nursing facility.  He continues to walk with the help of a walker.  He is here today to discuss results of his most recent CT angio.  Past Medical History:  Diagnosis Date   Chronic back pain    stenosis   Enlarged prostate    GERD (gastroesophageal reflux disease)    occasionally will take a zantac(maybe once a month)   History of colon polyps    History of kidney stones    History of stress test    done 10 yrs. ago, as a baseline    Hyperlipidemia    takes Crestor  daily   Hypertension    takes Amlodipine  daily and Lotensin  as well   Stroke [facility])    History reviewed. No pertinent family history. Past Surgical History:  Procedure Laterality Date   ANTERIOR LAT LUMBAR FUSION Left 12/15/2014   Procedure: ANTERIOR LATERAL LUMBAR FUSION 1 LEVEL;  Surgeon: Oneil Priestly, MD;  Location: [facility] OR;  Service: Orthopedics;  Laterality: Left;  Left sided lateral lumbar interbody fusion, lumbar 3-4, posterior spinal fusion, lumbar 3-4 with instrumentation.   COLONOSCOPY     ESOPHAGOGASTRODUODENOSCOPY     fatty tissue removed from stomach     LUMBAR LAMINECTOMY/DECOMPRESSION MICRODISCECTOMY N/A 07/08/2013   Procedure: LUMBAR LAMINECTOMY/DECOMPRESSION MICRODISCECTOMY;  Surgeon: Oneil Rodgers Priestly, MD;  Location: [facility] OR;  Service: Orthopedics;  Laterality: N/A;  Lumbar 3-4, lumbar 4-5 decompression   RADIOLOGY WITH ANESTHESIA N/A 10/22/2019   Procedure: MRI WITH ANESTHESIA OF LUMBAR SPINE  WITHOUT CONTRAST;  Surgeon: Radiologist, Medication, MD;  Location: [facility] OR;  Service: Radiology;  Laterality: N/A;   right ankle surgery     as child    THORACIC AORTIC ENDOVASCULAR STENT GRAFT N/A 10/15/2019   Procedure: THORACIC AORTIC ENDOVASCULAR STENT GRAFT;  Surgeon: Sheree Penne Bruckner, MD;  Location: [facility] OR;  Service: Vascular;  Laterality: N/A;   TONSILLECTOMY     as a child    Short Social History:  Social History   Tobacco Use   Smoking status: Former    Current packs/day: 0.25    Average packs/day: 0.3 packs/day for 40.0 years (10.0 ttl pk-yrs)    Types: Cigarettes   Smokeless tobacco: Never   Tobacco comments:    none since 06/24/13  Substance Use Topics   Alcohol use: No    Allergies  Allergen Reactions   Lyrica [Pregabalin] Nausea Only and Other (See Comments)    Hallucinations Dizziness    Sulfa Antibiotics Other (See Comments)    Unknown reaction    Current Outpatient Medications  Medication Sig Dispense Refill   acetaminophen  (TYLENOL ) 500 MG tablet Take 1,000 mg by mouth 2 (two) times daily.     amLODipine  (NORVASC ) 10 MG tablet Take 10 mg by mouth daily.     aspirin  EC 81 MG tablet Take 81 mg by mouth daily.     carvedilol  (COREG ) 25 MG tablet Take 1 tablet (25 mg total) by mouth 2 (  two) times daily with a meal. 60 tablet 0   hydrALAZINE  (APRESOLINE ) 10 MG tablet Take 1 tablet (10 mg total) by mouth 3 (three) times daily. 180 tablet 0   memantine  (NAMENDA ) 10 MG tablet TAKE 1 TABLET BY MOUTH TWICE DAILY 60 tablet 10   metFORMIN (GLUCOPHAGE) 500 MG tablet Take 500 mg by mouth 2 (two) times daily.     Multiple Vitamins-Minerals (MENS 50+ MULTIVITAMIN) TABS Take 1 tablet by mouth daily.     pantoprazole  (PROTONIX ) 40 MG tablet Take 1 tablet (40 mg total) by mouth daily. 30 tablet 0   rosuvastatin  (CRESTOR ) 5 MG tablet Take 5 mg by mouth at bedtime.     tamsulosin  (FLOMAX ) 0.4 MG CAPS capsule Take 1 capsule (0.4 mg total) by mouth daily. 30 capsule 0    No current facility-administered medications for this visit.    Review of Systems  Constitutional: Positive for fatigue.  HENT: HENT negative.  Eyes: Eyes negative.  Cardiovascular: Cardiovascular negative.  GI: Gastrointestinal negative.  Musculoskeletal: Positive for gait problem.  Neurological:       Weakness Hematologic: Hematologic/lymphatic negative.  Psychiatric: Psychiatric negative.        Objective:  Objective  Vitals:   04/08/24 1602  BP: 130/76  Pulse: (!) 57  Resp: 20  Temp: 98.3 F (36.8 C)  SpO2: 98%     Physical Exam HENT:     Head: Normocephalic.     Nose: Nose normal.     Mouth/Throat:     Mouth: Mucous membranes are moist.  Eyes:     Pupils: Pupils are equal, round, and reactive to light.  Cardiovascular:     Rate and Rhythm: Normal rate.     Pulses:          Femoral pulses are 2+ on the right side and 2+ on the left side.      Popliteal pulses are 2+ on the right side and 2+ on the left side.  Pulmonary:     Effort: Pulmonary effort is normal.  Abdominal:     General: Abdomen is flat.     Palpations: Abdomen is soft. There is mass.  Musculoskeletal:        General: Normal range of motion.     Cervical back: Normal range of motion and neck supple.     Right lower leg: No edema.     Left lower leg: No edema.  Skin:    General: Skin is warm.     Capillary Refill: Capillary refill takes less than 2 seconds.  Neurological:     General: No focal deficit present.     Mental Status: He is alert.  Psychiatric:        Mood and Affect: Mood normal.        Thought Content: Thought content normal.        Judgment: Judgment normal.     Data: CTA IMPRESSION: 1. No acute intracranial CT findings. Stable atrophy and small-vessel changes. 2. Interval lens extractions/replacements with dysconjugate gaze. 3. Endoluminal aortic stent graft again extends from the junction of the isthmus and descending segment ending in the hiatal  segment, with increased type 1A endoleak from the posterior aspect of the proximal landing zone, and at least a small type 2 contribution from an intercostal artery. Contrast is seen in the excluded aneurysm sac along the anteromedial aspect to the level of T7, and the descending aorta has increased in size from 4.3 cm in 2021 to 5.0 x 5.4  cm today. 4. Abdominal aortic dissection again extends from just below the aortic stent graft into the infrarenal segment terminating above the aortic bifurcation. The false lumen is patent but with increased near circumferential thrombus since the prior studies. 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 Vascular surgery referral recommended unless already done. 5. Dissection flap again extends into the SMA up to the first main bifurcation, the true lumen supplies about 40% of the overall vessel diameter with the false lumen up to 60%. The false lumen largely supplies the main right-sided SMA division and the true lumen supplies the remainder. 6. Small layering left pleural effusion with fluid density slightly above simple fluid inferiorly. Pleural space hemorrhage would be expected to be denser and more heterogeneous but the pleural effusion etiology is not readily apparent. 7. Mild cardiomegaly, but less than in 2021, with small pericardial effusion anteriorly. 8. IVC and hepatic vein reflux which may be seen with right heart dysfunction or tricuspid regurgitation. 9. Severe prostatomegaly including the median lobe, with moderate impression into the bladder. 10. Right inguinal hernia containing a short loop of the distal small bowel without evidence of hernia strangulation or incarceration. No upstream small bowel obstruction. 11. Small left inguinal fat hernia. 12. Uncomplicated diverticulosis. 13. Aortic atherosclerosis.       Assessment/Plan:     78 year old male status post repair of type B aortic  dissection for aneurysmal degeneration in the acute phase.  He was recently admitted and found to have aneurysmal degeneration of the perivisceral aorta up to 5.7 cm infrarenal and normal again at the level of the aortic bifurcation with patent bilateral common femoral and iliac arteries.  I have reviewed his CTA extensively and he appears to be a suitable candidate for [facility] repair which would leave him covered from just distal to the subclavian artery up to the level of the hypogastric arteries.  CT scan was reviewed with the patient and his daughter today at bedside.  We discussed the options being open repair versus endovascular versus no repair at all.  We discussed that he has between 5 and 10% risk of rupture within the year of which she would most certainly not survive given the complexity of repair required.  We discussed the procedural details of TAMBE as well as specific risks of spinal cord ischemia, stroke, bowel or kidney ischemia and injury to access arteries including the axillary and the bilateral common femorals.  At this time patient and his daughter desire more time to consider their options and will call to schedule.  If necessary we could have further discussion via phone call or in [place].  Patient would require spinal drain to decrease risk of spinal cord ischemia though he does have patent left subclavian and bilateral hypogastric arteries.  All questions were answered and they demonstrate good understanding.     Penne Lonni Colorado MD [facility]

## 2024-04-09 DIAGNOSIS — N1831 Chronic kidney disease, stage 3a: Secondary | ICD-10-CM | POA: Diagnosis not present

## 2024-04-13 ENCOUNTER — Telehealth: Payer: Self-pay

## 2024-04-13 NOTE — Telephone Encounter (Signed)
 Returned call to patient.  Wife had left a message requesting a family consult with Dr. Sheree.  This nurse has left a message to clarify wishes - ready to schedule surgery or do you need more information from Dr. Sheree.

## 2024-04-15 ENCOUNTER — Other Ambulatory Visit: Payer: Self-pay

## 2024-04-15 DIAGNOSIS — T82330D Leakage of aortic (bifurcation) graft (replacement), subsequent encounter: Secondary | ICD-10-CM | POA: Diagnosis not present

## 2024-04-15 DIAGNOSIS — Z8673 Personal history of transient ischemic attack (TIA), and cerebral infarction without residual deficits: Secondary | ICD-10-CM | POA: Diagnosis not present

## 2024-04-15 DIAGNOSIS — M549 Dorsalgia, unspecified: Secondary | ICD-10-CM | POA: Diagnosis not present

## 2024-04-15 DIAGNOSIS — N179 Acute kidney failure, unspecified: Secondary | ICD-10-CM | POA: Diagnosis not present

## 2024-04-15 DIAGNOSIS — D631 Anemia in chronic kidney disease: Secondary | ICD-10-CM | POA: Diagnosis not present

## 2024-04-15 DIAGNOSIS — K219 Gastro-esophageal reflux disease without esophagitis: Secondary | ICD-10-CM | POA: Diagnosis not present

## 2024-04-15 DIAGNOSIS — K5900 Constipation, unspecified: Secondary | ICD-10-CM | POA: Diagnosis not present

## 2024-04-15 DIAGNOSIS — E785 Hyperlipidemia, unspecified: Secondary | ICD-10-CM | POA: Diagnosis not present

## 2024-04-15 DIAGNOSIS — I716 Thoracoabdominal aortic aneurysm, without rupture, unspecified: Secondary | ICD-10-CM | POA: Diagnosis not present

## 2024-04-15 DIAGNOSIS — Z7984 Long term (current) use of oral hypoglycemic drugs: Secondary | ICD-10-CM | POA: Diagnosis not present

## 2024-04-15 DIAGNOSIS — Z981 Arthrodesis status: Secondary | ICD-10-CM | POA: Diagnosis not present

## 2024-04-15 DIAGNOSIS — I7103 Dissection of thoracoabdominal aorta: Secondary | ICD-10-CM

## 2024-04-15 DIAGNOSIS — N1831 Chronic kidney disease, stage 3a: Secondary | ICD-10-CM | POA: Diagnosis not present

## 2024-04-15 DIAGNOSIS — Z7409 Other reduced mobility: Secondary | ICD-10-CM | POA: Diagnosis not present

## 2024-04-15 DIAGNOSIS — I7090 Unspecified atherosclerosis: Secondary | ICD-10-CM | POA: Diagnosis not present

## 2024-04-15 DIAGNOSIS — Z9089 Acquired absence of other organs: Secondary | ICD-10-CM | POA: Diagnosis not present

## 2024-04-15 DIAGNOSIS — G9340 Encephalopathy, unspecified: Secondary | ICD-10-CM | POA: Diagnosis not present

## 2024-04-15 DIAGNOSIS — Z9181 History of falling: Secondary | ICD-10-CM | POA: Diagnosis not present

## 2024-04-15 DIAGNOSIS — I129 Hypertensive chronic kidney disease with stage 1 through stage 4 chronic kidney disease, or unspecified chronic kidney disease: Secondary | ICD-10-CM | POA: Diagnosis not present

## 2024-04-15 DIAGNOSIS — G8929 Other chronic pain: Secondary | ICD-10-CM | POA: Diagnosis not present

## 2024-04-15 DIAGNOSIS — Z7982 Long term (current) use of aspirin: Secondary | ICD-10-CM | POA: Diagnosis not present

## 2024-04-21 ENCOUNTER — Ambulatory Visit: Admitting: Podiatry

## 2024-04-21 DIAGNOSIS — M79675 Pain in left toe(s): Secondary | ICD-10-CM

## 2024-04-21 DIAGNOSIS — B351 Tinea unguium: Secondary | ICD-10-CM

## 2024-04-21 DIAGNOSIS — N183 Chronic kidney disease, stage 3 unspecified: Secondary | ICD-10-CM

## 2024-04-21 DIAGNOSIS — M79674 Pain in right toe(s): Secondary | ICD-10-CM

## 2024-04-21 DIAGNOSIS — E1140 Type 2 diabetes mellitus with diabetic neuropathy, unspecified: Secondary | ICD-10-CM

## 2024-04-21 NOTE — Progress Notes (Signed)
 This patient returns to my office for at risk foot care.  This patient requires this care by a professional since this patient will be at risk due to having acute kidney disease ,DM and CKD. He presents to the office with his wife.  This patient is unable to cut nails himself since the patient cannot reach his nails.These nails are painful walking and wearing shoes.  This patient presents for at risk foot care today.  General Appearance  Alert, conversant and in no acute stress.  Vascular  Dorsalis pedis and posterior tibial  pulses are palpable  bilaterally.  Capillary return is within normal limits  bilaterally. Temperature is within normal limits  bilaterally.  Neurologic  Senn-Weinstein monofilament wire test diminished   bilaterally. Muscle power within normal limits bilaterally.  Nails Thick disfigured discolored nails with subungual debris  from hallux to fifth toes bilaterally. No evidence of bacterial infection or drainage bilaterally.   Orthopedic  No limitations of motion  feet .  No crepitus or effusions noted.  HAV  B/L.  Hammer toes 2-5  B/L.  Skin  normotropic skin with no porokeratosis noted bilaterally.  No signs of infections or ulcers noted.     Onychomycosis  Pain in right toes  Pain in left toes  Consent was obtained for treatment procedures.   Mechanical debridement of nails 1-5  bilaterally performed with a nail nipper.  Filed with dremel without incident.     Return office visit   12 weeks                   Told patient to return for periodic foot care and evaluation due to potential at risk complications.   Cordella Bold DPM

## 2024-04-22 DIAGNOSIS — K219 Gastro-esophageal reflux disease without esophagitis: Secondary | ICD-10-CM | POA: Diagnosis not present

## 2024-04-22 DIAGNOSIS — Z8673 Personal history of transient ischemic attack (TIA), and cerebral infarction without residual deficits: Secondary | ICD-10-CM | POA: Diagnosis not present

## 2024-04-22 DIAGNOSIS — Z7409 Other reduced mobility: Secondary | ICD-10-CM | POA: Diagnosis not present

## 2024-04-22 DIAGNOSIS — G8929 Other chronic pain: Secondary | ICD-10-CM | POA: Diagnosis not present

## 2024-04-22 DIAGNOSIS — E785 Hyperlipidemia, unspecified: Secondary | ICD-10-CM | POA: Diagnosis not present

## 2024-04-22 DIAGNOSIS — N1831 Chronic kidney disease, stage 3a: Secondary | ICD-10-CM | POA: Diagnosis not present

## 2024-04-22 DIAGNOSIS — I7090 Unspecified atherosclerosis: Secondary | ICD-10-CM | POA: Diagnosis not present

## 2024-04-22 DIAGNOSIS — Z9181 History of falling: Secondary | ICD-10-CM | POA: Diagnosis not present

## 2024-04-22 DIAGNOSIS — K5900 Constipation, unspecified: Secondary | ICD-10-CM | POA: Diagnosis not present

## 2024-04-22 DIAGNOSIS — D631 Anemia in chronic kidney disease: Secondary | ICD-10-CM | POA: Diagnosis not present

## 2024-04-22 DIAGNOSIS — I129 Hypertensive chronic kidney disease with stage 1 through stage 4 chronic kidney disease, or unspecified chronic kidney disease: Secondary | ICD-10-CM | POA: Diagnosis not present

## 2024-04-22 DIAGNOSIS — M549 Dorsalgia, unspecified: Secondary | ICD-10-CM | POA: Diagnosis not present

## 2024-04-22 DIAGNOSIS — Z9089 Acquired absence of other organs: Secondary | ICD-10-CM | POA: Diagnosis not present

## 2024-04-22 DIAGNOSIS — Z7984 Long term (current) use of oral hypoglycemic drugs: Secondary | ICD-10-CM | POA: Diagnosis not present

## 2024-04-22 DIAGNOSIS — Z981 Arthrodesis status: Secondary | ICD-10-CM | POA: Diagnosis not present

## 2024-04-22 DIAGNOSIS — G9340 Encephalopathy, unspecified: Secondary | ICD-10-CM | POA: Diagnosis not present

## 2024-04-22 DIAGNOSIS — E1122 Type 2 diabetes mellitus with diabetic chronic kidney disease: Secondary | ICD-10-CM | POA: Diagnosis not present

## 2024-04-22 DIAGNOSIS — T82330D Leakage of aortic (bifurcation) graft (replacement), subsequent encounter: Secondary | ICD-10-CM | POA: Diagnosis not present

## 2024-04-22 DIAGNOSIS — N179 Acute kidney failure, unspecified: Secondary | ICD-10-CM | POA: Diagnosis not present

## 2024-04-22 DIAGNOSIS — Z7982 Long term (current) use of aspirin: Secondary | ICD-10-CM | POA: Diagnosis not present

## 2024-04-22 DIAGNOSIS — I716 Thoracoabdominal aortic aneurysm, without rupture, unspecified: Secondary | ICD-10-CM | POA: Diagnosis not present

## 2024-04-23 DIAGNOSIS — Z7409 Other reduced mobility: Secondary | ICD-10-CM | POA: Diagnosis not present

## 2024-04-23 DIAGNOSIS — T82330D Leakage of aortic (bifurcation) graft (replacement), subsequent encounter: Secondary | ICD-10-CM | POA: Diagnosis not present

## 2024-04-23 DIAGNOSIS — N179 Acute kidney failure, unspecified: Secondary | ICD-10-CM | POA: Diagnosis not present

## 2024-04-23 DIAGNOSIS — I7090 Unspecified atherosclerosis: Secondary | ICD-10-CM | POA: Diagnosis not present

## 2024-04-23 DIAGNOSIS — D631 Anemia in chronic kidney disease: Secondary | ICD-10-CM | POA: Diagnosis not present

## 2024-04-23 DIAGNOSIS — E785 Hyperlipidemia, unspecified: Secondary | ICD-10-CM | POA: Diagnosis not present

## 2024-04-23 DIAGNOSIS — M549 Dorsalgia, unspecified: Secondary | ICD-10-CM | POA: Diagnosis not present

## 2024-04-23 DIAGNOSIS — G8929 Other chronic pain: Secondary | ICD-10-CM | POA: Diagnosis not present

## 2024-04-23 DIAGNOSIS — Z7982 Long term (current) use of aspirin: Secondary | ICD-10-CM | POA: Diagnosis not present

## 2024-04-23 DIAGNOSIS — Z9089 Acquired absence of other organs: Secondary | ICD-10-CM | POA: Diagnosis not present

## 2024-04-23 DIAGNOSIS — Z7984 Long term (current) use of oral hypoglycemic drugs: Secondary | ICD-10-CM | POA: Diagnosis not present

## 2024-04-23 DIAGNOSIS — Z9181 History of falling: Secondary | ICD-10-CM | POA: Diagnosis not present

## 2024-04-23 DIAGNOSIS — G9340 Encephalopathy, unspecified: Secondary | ICD-10-CM | POA: Diagnosis not present

## 2024-04-23 DIAGNOSIS — K5900 Constipation, unspecified: Secondary | ICD-10-CM | POA: Diagnosis not present

## 2024-04-23 DIAGNOSIS — E1122 Type 2 diabetes mellitus with diabetic chronic kidney disease: Secondary | ICD-10-CM | POA: Diagnosis not present

## 2024-04-23 DIAGNOSIS — I716 Thoracoabdominal aortic aneurysm, without rupture, unspecified: Secondary | ICD-10-CM | POA: Diagnosis not present

## 2024-04-23 DIAGNOSIS — Z8673 Personal history of transient ischemic attack (TIA), and cerebral infarction without residual deficits: Secondary | ICD-10-CM | POA: Diagnosis not present

## 2024-04-23 DIAGNOSIS — I129 Hypertensive chronic kidney disease with stage 1 through stage 4 chronic kidney disease, or unspecified chronic kidney disease: Secondary | ICD-10-CM | POA: Diagnosis not present

## 2024-04-23 DIAGNOSIS — K219 Gastro-esophageal reflux disease without esophagitis: Secondary | ICD-10-CM | POA: Diagnosis not present

## 2024-04-23 DIAGNOSIS — Z981 Arthrodesis status: Secondary | ICD-10-CM | POA: Diagnosis not present

## 2024-04-23 DIAGNOSIS — N1831 Chronic kidney disease, stage 3a: Secondary | ICD-10-CM | POA: Diagnosis not present

## 2024-04-29 DIAGNOSIS — Z981 Arthrodesis status: Secondary | ICD-10-CM | POA: Diagnosis not present

## 2024-04-29 DIAGNOSIS — E785 Hyperlipidemia, unspecified: Secondary | ICD-10-CM | POA: Diagnosis not present

## 2024-04-29 DIAGNOSIS — I7090 Unspecified atherosclerosis: Secondary | ICD-10-CM | POA: Diagnosis not present

## 2024-04-29 DIAGNOSIS — I129 Hypertensive chronic kidney disease with stage 1 through stage 4 chronic kidney disease, or unspecified chronic kidney disease: Secondary | ICD-10-CM | POA: Diagnosis not present

## 2024-04-29 DIAGNOSIS — Z7982 Long term (current) use of aspirin: Secondary | ICD-10-CM | POA: Diagnosis not present

## 2024-04-29 DIAGNOSIS — N179 Acute kidney failure, unspecified: Secondary | ICD-10-CM | POA: Diagnosis not present

## 2024-04-29 DIAGNOSIS — N1831 Chronic kidney disease, stage 3a: Secondary | ICD-10-CM | POA: Diagnosis not present

## 2024-04-29 DIAGNOSIS — K5900 Constipation, unspecified: Secondary | ICD-10-CM | POA: Diagnosis not present

## 2024-04-29 DIAGNOSIS — G8929 Other chronic pain: Secondary | ICD-10-CM | POA: Diagnosis not present

## 2024-04-29 DIAGNOSIS — E1122 Type 2 diabetes mellitus with diabetic chronic kidney disease: Secondary | ICD-10-CM | POA: Diagnosis not present

## 2024-04-29 DIAGNOSIS — Z8673 Personal history of transient ischemic attack (TIA), and cerebral infarction without residual deficits: Secondary | ICD-10-CM | POA: Diagnosis not present

## 2024-04-29 DIAGNOSIS — I716 Thoracoabdominal aortic aneurysm, without rupture, unspecified: Secondary | ICD-10-CM | POA: Diagnosis not present

## 2024-04-29 DIAGNOSIS — K219 Gastro-esophageal reflux disease without esophagitis: Secondary | ICD-10-CM | POA: Diagnosis not present

## 2024-04-29 DIAGNOSIS — D631 Anemia in chronic kidney disease: Secondary | ICD-10-CM | POA: Diagnosis not present

## 2024-04-29 DIAGNOSIS — Z7409 Other reduced mobility: Secondary | ICD-10-CM | POA: Diagnosis not present

## 2024-04-29 DIAGNOSIS — T82330D Leakage of aortic (bifurcation) graft (replacement), subsequent encounter: Secondary | ICD-10-CM | POA: Diagnosis not present

## 2024-04-29 DIAGNOSIS — Z9181 History of falling: Secondary | ICD-10-CM | POA: Diagnosis not present

## 2024-04-29 DIAGNOSIS — G9340 Encephalopathy, unspecified: Secondary | ICD-10-CM | POA: Diagnosis not present

## 2024-04-29 DIAGNOSIS — M549 Dorsalgia, unspecified: Secondary | ICD-10-CM | POA: Diagnosis not present

## 2024-04-29 DIAGNOSIS — Z7984 Long term (current) use of oral hypoglycemic drugs: Secondary | ICD-10-CM | POA: Diagnosis not present

## 2024-04-29 DIAGNOSIS — Z9089 Acquired absence of other organs: Secondary | ICD-10-CM | POA: Diagnosis not present

## 2024-05-04 NOTE — Progress Notes (Signed)
 Surgical Instructions   Your procedure is scheduled on Friday, September 19th. Report to [facility] Main Entrance A at 7:50 A.M., then check in with the Admitting office. Any questions or running late day of surgery: call [phone]  Questions prior to your surgery date: call [phone], Monday-Friday, 8am-4pm. If you experience any cold or flu symptoms such as cough, fever, chills, shortness of breath, etc. between now and your scheduled surgery, please notify us  at the above number.     Remember:  Do not eat or drink after midnight the night before your surgery   Take these medicines the morning of surgery with A SIP OF WATER  acetaminophen  (TYLENOL )  amLODipine  (NORVASC )  aspirin  EC  carvedilol  (COREG )  memantine  (NAMENDA )  pantoprazole  (PROTONIX ) tamsulosin  (FLOMAX )    One week prior to surgery, STOP taking any Aspirin  (unless otherwise instructed by your surgeon) Aleve, Naproxen, Ibuprofen, Motrin, Advil, Goody's, BC's, all herbal medications, fish oil, and non-prescription vitamins.            WHAT DO I DO ABOUT MY DIABETES MEDICATION?   Do not take oral diabetes medicines metFORMIN (GLUCOPHAGE) the morning of surgery. Last dose Thursday, September 14th.  The day of surgery, do not take other diabetes injectables, including Byetta (exenatide), Bydureon (exenatide ER), Victoza (liraglutide), or Trulicity (dulaglutide).   HOW TO MANAGE YOUR DIABETES BEFORE AND AFTER SURGERY  Why is it important to control my blood sugar before and after surgery? Improving blood sugar levels before and after surgery helps healing and can limit problems. A way of improving blood sugar control is eating a healthy diet by:  Eating less sugar and carbohydrates  Increasing activity/exercise  Talking with your doctor about reaching your blood sugar goals High blood sugars (greater than 180 mg/dL) can raise your risk of infections and slow your recovery, so you will need to focus on  controlling your diabetes during the weeks before surgery. Make sure that the doctor who takes care of your diabetes knows about your planned surgery including the date and location.  How do I manage my blood sugar before surgery? Check your blood sugar at least 4 times a day, starting 2 days before surgery, to make sure that the level is not too high or low.  Check your blood sugar the morning of your surgery when you wake up and every 2 hours until you get to the Short Stay unit.  If your blood sugar is less than 70 mg/dL, you will need to treat for low blood sugar: Do not take insulin . Treat a low blood sugar (less than 70 mg/dL) with  cup of clear juice (cranberry or apple), 4 glucose tablets, OR glucose gel. Recheck blood sugar in 15 minutes after treatment (to make sure it is greater than 70 mg/dL). If your blood sugar is not greater than 70 mg/dL on recheck, call [phone] for further instructions. Report your blood sugar to the short stay nurse when you get to Short Stay.  If you are admitted to the hospital after surgery: Your blood sugar will be checked by the staff and you will probably be given insulin  after surgery (instead of oral diabetes medicines) to make sure you have good blood sugar levels. The goal for blood sugar control after surgery is 80-180 mg/dL.             Do NOT Smoke (Tobacco/Vaping) for 24 hours prior to your procedure.  If you use a CPAP at night, you may bring your mask/headgear for  your overnight stay.   You will be asked to remove any contacts, glasses, piercing's, hearing aid's, dentures/partials prior to surgery. Please bring cases for these items if needed.    Patients discharged the day of surgery will not be allowed to drive home, and someone needs to stay with them for 24 hours.  SURGICAL WAITING ROOM VISITATION Patients may have no more than 2 support people in the waiting area - these visitors may rotate.   Pre-op nurse will coordinate an  appropriate time for 1 ADULT support [name] [name], who may not rotate, to accompany patient in pre-op.  Children under the age of 19 must have an adult with them who is not the patient and must remain in the main waiting area with an adult.  If the patient needs to stay at the hospital during part of their recovery, the visitor guidelines for inpatient rooms apply.  Please refer to the [facility] website for the visitor guidelines for any additional information.   If you received a COVID test during your pre-op visit  it is requested that you wear a mask when out in public, stay away from anyone that may not be feeling well and notify your surgeon if you develop symptoms. If you have been in contact with anyone that has tested positive in the last 10 days please notify you surgeon.      Pre-operative CHG Bathing Instructions   You can play a key role in reducing the risk of infection after surgery. Your skin needs to be as free of germs as possible. You can reduce the number of germs on your skin by washing with CHG (chlorhexidine  gluconate) soap before surgery. CHG is an antiseptic soap that kills germs and continues to kill germs even after washing.   DO NOT use if you have an allergy to chlorhexidine /CHG or antibacterial soaps. If your skin becomes reddened or irritated, stop using the CHG and notify one of our RNs at [phone].              TAKE A SHOWER THE NIGHT BEFORE SURGERY AND THE DAY OF SURGERY    Please keep in mind the following:  DO NOT shave, including legs and underarms, 48 hours prior to surgery.   You may shave your face before/day of surgery.  Place clean sheets on your bed the night before surgery Use a clean washcloth (not used since being washed) for each shower. DO NOT sleep with pet's night before surgery.  CHG Shower Instructions:  Wash your face and private area with normal soap. If you choose to wash your hair, wash first with your normal shampoo.  After you use  shampoo/soap, rinse your hair and body thoroughly to remove shampoo/soap residue.  Turn the water OFF and apply half the bottle of CHG soap to a CLEAN washcloth.  Apply CHG soap ONLY FROM YOUR NECK DOWN TO YOUR TOES (washing for 3-5 minutes)  DO NOT use CHG soap on face, private areas, open wounds, or sores.  Pay special attention to the area where your surgery is being performed.  If you are having back surgery, having someone wash your back for you may be helpful. Wait 2 minutes after CHG soap is applied, then you may rinse off the CHG soap.  Pat dry with a clean towel  Put on clean pajamas    Additional instructions for the day of surgery: DO NOT APPLY any lotions, deodorants, cologne, or perfumes.   Do not wear jewelry or  makeup Do not wear nail polish, gel polish, artificial nails, or any other type of covering on natural nails (fingers and toes) Do not bring valuables to the hospital. [facility] is not responsible for valuables/personal belongings. Put on clean/comfortable clothes.  Please brush your teeth.  Ask your nurse before applying any prescription medications to the skin.

## 2024-05-05 ENCOUNTER — Inpatient Hospital Stay (HOSPITAL_COMMUNITY)
Admission: RE | Admit: 2024-05-05 | Discharge: 2024-05-05 | Disposition: A | Discharge status: Home / Self Care | Source: Ambulatory Visit

## 2024-05-05 ENCOUNTER — Other Ambulatory Visit: Payer: Self-pay | Admitting: Adult Health

## 2024-05-05 DIAGNOSIS — Z9181 History of falling: Secondary | ICD-10-CM | POA: Diagnosis not present

## 2024-05-05 DIAGNOSIS — K5900 Constipation, unspecified: Secondary | ICD-10-CM | POA: Diagnosis not present

## 2024-05-05 DIAGNOSIS — T82330D Leakage of aortic (bifurcation) graft (replacement), subsequent encounter: Secondary | ICD-10-CM | POA: Diagnosis not present

## 2024-05-05 DIAGNOSIS — G8929 Other chronic pain: Secondary | ICD-10-CM | POA: Diagnosis not present

## 2024-05-05 DIAGNOSIS — M549 Dorsalgia, unspecified: Secondary | ICD-10-CM | POA: Diagnosis not present

## 2024-05-05 DIAGNOSIS — Z7982 Long term (current) use of aspirin: Secondary | ICD-10-CM | POA: Diagnosis not present

## 2024-05-05 DIAGNOSIS — I129 Hypertensive chronic kidney disease with stage 1 through stage 4 chronic kidney disease, or unspecified chronic kidney disease: Secondary | ICD-10-CM | POA: Diagnosis not present

## 2024-05-05 DIAGNOSIS — D631 Anemia in chronic kidney disease: Secondary | ICD-10-CM | POA: Diagnosis not present

## 2024-05-05 DIAGNOSIS — Z7984 Long term (current) use of oral hypoglycemic drugs: Secondary | ICD-10-CM | POA: Diagnosis not present

## 2024-05-05 DIAGNOSIS — E1122 Type 2 diabetes mellitus with diabetic chronic kidney disease: Secondary | ICD-10-CM | POA: Diagnosis not present

## 2024-05-05 DIAGNOSIS — G9340 Encephalopathy, unspecified: Secondary | ICD-10-CM | POA: Diagnosis not present

## 2024-05-05 DIAGNOSIS — Z9089 Acquired absence of other organs: Secondary | ICD-10-CM | POA: Diagnosis not present

## 2024-05-05 DIAGNOSIS — N179 Acute kidney failure, unspecified: Secondary | ICD-10-CM | POA: Diagnosis not present

## 2024-05-05 DIAGNOSIS — I716 Thoracoabdominal aortic aneurysm, without rupture, unspecified: Secondary | ICD-10-CM | POA: Diagnosis not present

## 2024-05-05 DIAGNOSIS — Z981 Arthrodesis status: Secondary | ICD-10-CM | POA: Diagnosis not present

## 2024-05-05 DIAGNOSIS — K219 Gastro-esophageal reflux disease without esophagitis: Secondary | ICD-10-CM | POA: Diagnosis not present

## 2024-05-05 DIAGNOSIS — E785 Hyperlipidemia, unspecified: Secondary | ICD-10-CM | POA: Diagnosis not present

## 2024-05-05 DIAGNOSIS — Z8673 Personal history of transient ischemic attack (TIA), and cerebral infarction without residual deficits: Secondary | ICD-10-CM | POA: Diagnosis not present

## 2024-05-05 DIAGNOSIS — Z7409 Other reduced mobility: Secondary | ICD-10-CM | POA: Diagnosis not present

## 2024-05-05 DIAGNOSIS — N1831 Chronic kidney disease, stage 3a: Secondary | ICD-10-CM | POA: Diagnosis not present

## 2024-05-05 DIAGNOSIS — F0150 Vascular dementia without behavioral disturbance: Secondary | ICD-10-CM

## 2024-05-05 DIAGNOSIS — I7090 Unspecified atherosclerosis: Secondary | ICD-10-CM | POA: Diagnosis not present

## 2024-05-06 ENCOUNTER — Encounter (HOSPITAL_COMMUNITY): Payer: Self-pay

## 2024-05-06 ENCOUNTER — Other Ambulatory Visit (HOSPITAL_COMMUNITY)

## 2024-05-06 ENCOUNTER — Encounter (HOSPITAL_COMMUNITY)
Admission: RE | Admit: 2024-05-06 | Discharge: 2024-05-06 | Disposition: A | Discharge status: Home / Self Care | Source: Ambulatory Visit | Attending: Vascular Surgery | Admitting: Vascular Surgery

## 2024-05-06 ENCOUNTER — Other Ambulatory Visit: Payer: Self-pay

## 2024-05-06 VITALS — BP 150/74 | HR 52 | Temp 98.1°F | Resp 18 | Ht 71.0 in | Wt 195.5 lb

## 2024-05-06 DIAGNOSIS — M549 Dorsalgia, unspecified: Secondary | ICD-10-CM | POA: Insufficient documentation

## 2024-05-06 DIAGNOSIS — N179 Acute kidney failure, unspecified: Secondary | ICD-10-CM | POA: Diagnosis not present

## 2024-05-06 DIAGNOSIS — I7103 Dissection of thoracoabdominal aorta: Secondary | ICD-10-CM | POA: Insufficient documentation

## 2024-05-06 DIAGNOSIS — I10 Essential (primary) hypertension: Secondary | ICD-10-CM | POA: Diagnosis not present

## 2024-05-06 DIAGNOSIS — Z79899 Other long term (current) drug therapy: Secondary | ICD-10-CM | POA: Insufficient documentation

## 2024-05-06 DIAGNOSIS — I716 Thoracoabdominal aortic aneurysm, without rupture, unspecified: Secondary | ICD-10-CM | POA: Diagnosis not present

## 2024-05-06 DIAGNOSIS — I97821 Postprocedural cerebrovascular infarction during other surgery: Secondary | ICD-10-CM | POA: Diagnosis not present

## 2024-05-06 DIAGNOSIS — F0390 Unspecified dementia without behavioral disturbance: Secondary | ICD-10-CM | POA: Insufficient documentation

## 2024-05-06 DIAGNOSIS — I129 Hypertensive chronic kidney disease with stage 1 through stage 4 chronic kidney disease, or unspecified chronic kidney disease: Secondary | ICD-10-CM | POA: Diagnosis not present

## 2024-05-06 DIAGNOSIS — Z87891 Personal history of nicotine dependence: Secondary | ICD-10-CM | POA: Insufficient documentation

## 2024-05-06 DIAGNOSIS — K5900 Constipation, unspecified: Secondary | ICD-10-CM | POA: Diagnosis not present

## 2024-05-06 DIAGNOSIS — Z7409 Other reduced mobility: Secondary | ICD-10-CM | POA: Diagnosis not present

## 2024-05-06 DIAGNOSIS — I69354 Hemiplegia and hemiparesis following cerebral infarction affecting left non-dominant side: Secondary | ICD-10-CM | POA: Diagnosis not present

## 2024-05-06 DIAGNOSIS — G8929 Other chronic pain: Secondary | ICD-10-CM | POA: Insufficient documentation

## 2024-05-06 DIAGNOSIS — Z01812 Encounter for preprocedural laboratory examination: Secondary | ICD-10-CM | POA: Insufficient documentation

## 2024-05-06 DIAGNOSIS — Z9181 History of falling: Secondary | ICD-10-CM | POA: Diagnosis not present

## 2024-05-06 DIAGNOSIS — D631 Anemia in chronic kidney disease: Secondary | ICD-10-CM | POA: Diagnosis not present

## 2024-05-06 DIAGNOSIS — K219 Gastro-esophageal reflux disease without esophagitis: Secondary | ICD-10-CM | POA: Diagnosis not present

## 2024-05-06 DIAGNOSIS — E785 Hyperlipidemia, unspecified: Secondary | ICD-10-CM | POA: Insufficient documentation

## 2024-05-06 DIAGNOSIS — D72829 Elevated white blood cell count, unspecified: Secondary | ICD-10-CM | POA: Diagnosis not present

## 2024-05-06 DIAGNOSIS — Z8673 Personal history of transient ischemic attack (TIA), and cerebral infarction without residual deficits: Secondary | ICD-10-CM | POA: Insufficient documentation

## 2024-05-06 DIAGNOSIS — D696 Thrombocytopenia, unspecified: Secondary | ICD-10-CM | POA: Diagnosis not present

## 2024-05-06 DIAGNOSIS — E1122 Type 2 diabetes mellitus with diabetic chronic kidney disease: Secondary | ICD-10-CM | POA: Diagnosis not present

## 2024-05-06 DIAGNOSIS — I7142 Juxtarenal abdominal aortic aneurysm, without rupture: Secondary | ICD-10-CM | POA: Diagnosis not present

## 2024-05-06 DIAGNOSIS — I7090 Unspecified atherosclerosis: Secondary | ICD-10-CM | POA: Diagnosis not present

## 2024-05-06 DIAGNOSIS — Z7982 Long term (current) use of aspirin: Secondary | ICD-10-CM | POA: Diagnosis not present

## 2024-05-06 DIAGNOSIS — E1151 Type 2 diabetes mellitus with diabetic peripheral angiopathy without gangrene: Secondary | ICD-10-CM | POA: Diagnosis not present

## 2024-05-06 DIAGNOSIS — H53002 Unspecified amblyopia, left eye: Secondary | ICD-10-CM | POA: Diagnosis not present

## 2024-05-06 DIAGNOSIS — Z9089 Acquired absence of other organs: Secondary | ICD-10-CM | POA: Diagnosis not present

## 2024-05-06 DIAGNOSIS — E119 Type 2 diabetes mellitus without complications: Secondary | ICD-10-CM | POA: Insufficient documentation

## 2024-05-06 DIAGNOSIS — Z7984 Long term (current) use of oral hypoglycemic drugs: Secondary | ICD-10-CM | POA: Diagnosis not present

## 2024-05-06 DIAGNOSIS — I7102 Dissection of abdominal aorta: Secondary | ICD-10-CM | POA: Diagnosis not present

## 2024-05-06 DIAGNOSIS — I9789 Other postprocedural complications and disorders of the circulatory system, not elsewhere classified: Secondary | ICD-10-CM | POA: Diagnosis not present

## 2024-05-06 DIAGNOSIS — D649 Anemia, unspecified: Secondary | ICD-10-CM | POA: Insufficient documentation

## 2024-05-06 DIAGNOSIS — Z01818 Encounter for other preprocedural examination: Secondary | ICD-10-CM

## 2024-05-06 DIAGNOSIS — I63441 Cerebral infarction due to embolism of right cerebellar artery: Secondary | ICD-10-CM | POA: Diagnosis not present

## 2024-05-06 DIAGNOSIS — R471 Dysarthria and anarthria: Secondary | ICD-10-CM | POA: Diagnosis not present

## 2024-05-06 DIAGNOSIS — D62 Acute posthemorrhagic anemia: Secondary | ICD-10-CM | POA: Diagnosis not present

## 2024-05-06 DIAGNOSIS — Z7902 Long term (current) use of antithrombotics/antiplatelets: Secondary | ICD-10-CM | POA: Diagnosis not present

## 2024-05-06 DIAGNOSIS — N1831 Chronic kidney disease, stage 3a: Secondary | ICD-10-CM | POA: Diagnosis not present

## 2024-05-06 DIAGNOSIS — Z981 Arthrodesis status: Secondary | ICD-10-CM | POA: Diagnosis not present

## 2024-05-06 DIAGNOSIS — G9340 Encephalopathy, unspecified: Secondary | ICD-10-CM | POA: Diagnosis not present

## 2024-05-06 DIAGNOSIS — T82330D Leakage of aortic (bifurcation) graft (replacement), subsequent encounter: Secondary | ICD-10-CM | POA: Diagnosis not present

## 2024-05-06 DIAGNOSIS — F17210 Nicotine dependence, cigarettes, uncomplicated: Secondary | ICD-10-CM | POA: Diagnosis not present

## 2024-05-06 DIAGNOSIS — Z8679 Personal history of other diseases of the circulatory system: Secondary | ICD-10-CM | POA: Insufficient documentation

## 2024-05-06 LAB — PROTIME-INR
INR: 1.1 (ref 0.8–1.2)
Prothrombin Time: 14.8 s (ref 11.4–15.2)

## 2024-05-06 LAB — URINALYSIS, ROUTINE W REFLEX MICROSCOPIC
Bilirubin Urine: NEGATIVE
Glucose, UA: NEGATIVE mg/dL
Hgb urine dipstick: NEGATIVE
Ketones, ur: NEGATIVE mg/dL
Leukocytes,Ua: NEGATIVE
Nitrite: NEGATIVE
Protein, ur: NEGATIVE mg/dL
Specific Gravity, Urine: 1.013 (ref 1.005–1.030)
pH: 7 (ref 5.0–8.0)

## 2024-05-06 LAB — COMPREHENSIVE METABOLIC PANEL WITH GFR
ALT: 15 U/L (ref 0–44)
AST: 18 U/L (ref 15–41)
Albumin: 3.7 g/dL (ref 3.5–5.0)
Alkaline Phosphatase: 76 U/L (ref 38–126)
Anion gap: 13 (ref 5–15)
BUN: 11 mg/dL (ref 8–23)
CO2: 25 mmol/L (ref 22–32)
Calcium: 9.3 mg/dL (ref 8.9–10.3)
Chloride: 105 mmol/L (ref 98–111)
Creatinine, Ser: 1.21 mg/dL (ref 0.61–1.24)
GFR, Estimated: >60 mL/min (ref >60)
Glucose, Bld: 119 mg/dL — ABNORMAL HIGH (ref 70–99)
Potassium: 3.8 mmol/L (ref 3.5–5.1)
Sodium: 143 mmol/L (ref 135–145)
Total Bilirubin: 0.9 mg/dL (ref 0.0–1.2)
Total Protein: 6.7 g/dL (ref 6.5–8.1)

## 2024-05-06 LAB — CBC
HCT: 38.9 % — ABNORMAL LOW (ref 39.0–52.0)
Hemoglobin: 12 g/dL — ABNORMAL LOW (ref 13.0–17.0)
MCH: 28.2 pg (ref 26.0–34.0)
MCHC: 30.8 g/dL (ref 30.0–36.0)
MCV: 91.5 fL (ref 80.0–100.0)
Platelets: 153 K/uL (ref 150–400)
RBC: 4.25 MIL/uL (ref 4.22–5.81)
RDW: 15.9 % — ABNORMAL HIGH (ref 11.5–15.5)
WBC: 7 K/uL (ref 4.0–10.5)
nRBC: 0 % (ref 0.0–0.2)

## 2024-05-06 LAB — SURGICAL PCR SCREEN

## 2024-05-06 LAB — APTT: aPTT: 32 s (ref 24–36)

## 2024-05-06 LAB — GLUCOSE, CAPILLARY: Glucose-Capillary: 114 mg/dL — ABNORMAL HIGH (ref 70–99)

## 2024-05-06 LAB — HEMOGLOBIN A1C
Hgb A1c MFr Bld: 5.8 % — ABNORMAL HIGH (ref 4.8–5.6)
Mean Plasma Glucose: 119.76 mg/dL

## 2024-05-06 NOTE — Progress Notes (Signed)
 PCP - Ardell Ransom COME Cardiologist -  Neurology - Harlene Bogaert AGNP-BC  PPM/ICD - denies  Device Orders -  Rep Notified -   Chest x-ray - na EKG - 02/29/24 Stress Test - >10 years ago ECHO - 03/02/24 Cardiac Cath - denies  Sleep Study - denies CPAP - no  Fasting Blood Sugar - pt does not have a meter and does not check his sugar. Checks Blood Sugar _____ times a day  Last dose of GLP1 agonist-  na GLP1 instructions:   Blood Thinner Instructions:na Aspirin  Instructions:continue  ERAS Protcol -no PRE-SURGERY Ensure or G2-   COVID TEST- na   Anesthesia review: yes- hx CKD stg3,HTN,CVA,Aortic aneurysm,DM  Patient denies shortness of breath, fever, cough and chest pain at PAT appointment   All instructions explained to the patient, with a verbal understanding of the material. Patient agrees to go over the instructions while at home for a better understanding.The opportunity to ask questions was provided.

## 2024-05-06 NOTE — Progress Notes (Signed)
 Surgical Instructions   Your procedure is scheduled on Friday, September 19th. Report to [facility] Main Entrance A at 7:50 A.M., then check in with the Admitting office. Any questions or running late day of surgery: call [phone]  Questions prior to your surgery date: call [phone], Monday-Friday, 8am-4pm. If you experience any cold or flu symptoms such as cough, fever, chills, shortness of breath, etc. between now and your scheduled surgery, please notify us  at the above number.     Remember:  Do not eat or drink after midnight the night before your surgery   Take these medicines the morning of surgery with A SIP OF WATER  acetaminophen  (TYLENOL )  amLODipine  (NORVASC )  aspirin  EC  carvedilol  (COREG )  memantine  (NAMENDA )  tamsulosin  (FLOMAX )    One week prior to surgery, STOP taking any  Aleve, Naproxen, Ibuprofen, Motrin, Advil, Goody's, BC's, all herbal medications, fish oil, and non-prescription vitamins.            WHAT DO I DO ABOUT MY DIABETES MEDICATION?   Do not take oral diabetes medicines metFORMIN (GLUCOPHAGE) the morning of surgery. Last dose Thursday, September 14th.  The day of surgery, do not take other diabetes injectables, including Byetta (exenatide), Bydureon (exenatide ER), Victoza (liraglutide), or Trulicity (dulaglutide).   HOW TO MANAGE YOUR DIABETES BEFORE AND AFTER SURGERY  Why is it important to control my blood sugar before and after surgery? Improving blood sugar levels before and after surgery helps healing and can limit problems. A way of improving blood sugar control is eating a healthy diet by:  Eating less sugar and carbohydrates  Increasing activity/exercise  Talking with your doctor about reaching your blood sugar goals High blood sugars (greater than 180 mg/dL) can raise your risk of infections and slow your recovery, so you will need to focus on controlling your diabetes during the weeks before surgery. Make sure that the  doctor who takes care of your diabetes knows about your planned surgery including the date and location.  How do I manage my blood sugar before surgery? Check your blood sugar at least 4 times a day, starting 2 days before surgery, to make sure that the level is not too high or low.  Check your blood sugar the morning of your surgery when you wake up and every 2 hours until you get to the Short Stay unit.  If your blood sugar is less than 70 mg/dL, you will need to treat for low blood sugar: Do not take insulin . Treat a low blood sugar (less than 70 mg/dL) with  cup of clear juice (cranberry or apple), 4 glucose tablets, OR glucose gel. Recheck blood sugar in 15 minutes after treatment (to make sure it is greater than 70 mg/dL). If your blood sugar is not greater than 70 mg/dL on recheck, call [phone] for further instructions. Report your blood sugar to the short stay nurse when you get to Short Stay.  If you are admitted to the hospital after surgery: Your blood sugar will be checked by the staff and you will probably be given insulin  after surgery (instead of oral diabetes medicines) to make sure you have good blood sugar levels. The goal for blood sugar control after surgery is 80-180 mg/dL.             Do NOT Smoke (Tobacco/Vaping) for 24 hours prior to your procedure.  If you use a CPAP at night, you may bring your mask/headgear for your overnight stay.   You will be  asked to remove any contacts, glasses, piercing's, hearing aid's, dentures/partials prior to surgery. Please bring cases for these items if needed.    Patients discharged the day of surgery will not be allowed to drive home, and someone needs to stay with them for 24 hours.  SURGICAL WAITING ROOM VISITATION Patients may have no more than 2 support people in the waiting area - these visitors may rotate.   Pre-op nurse will coordinate an appropriate time for 1 ADULT support Gottlieb, who may not rotate, to accompany  patient in pre-op.  Children under the age of 10 must have an adult with them who is not the patient and must remain in the main waiting area with an adult.  If the patient needs to stay at the hospital during part of their recovery, the visitor guidelines for inpatient rooms apply.  Please refer to the [facility] website for the visitor guidelines for any additional information.   If you received a COVID test during your pre-op visit  it is requested that you wear a mask when out in public, stay away from anyone that may not be feeling well and notify your surgeon if you develop symptoms. If you have been in contact with anyone that has tested positive in the last 10 days please notify you surgeon.      Pre-operative CHG Bathing Instructions   You can play a key role in reducing the risk of infection after surgery. Your skin needs to be as free of germs as possible. You can reduce the number of germs on your skin by washing with CHG (chlorhexidine  gluconate) soap before surgery. CHG is an antiseptic soap that kills germs and continues to kill germs even after washing.   DO NOT use if you have an allergy to chlorhexidine /CHG or antibacterial soaps. If your skin becomes reddened or irritated, stop using the CHG and notify one of our RNs at [phone].              TAKE A SHOWER THE NIGHT BEFORE SURGERY AND THE DAY OF SURGERY    Please keep in mind the following:  DO NOT shave, including legs and underarms, 48 hours prior to surgery.   You may shave your face before/day of surgery.  Place clean sheets on your bed the night before surgery Use a clean washcloth (not used since being washed) for each shower. DO NOT sleep with pet's night before surgery.  CHG Shower Instructions:  Wash your face and private area with normal soap. If you choose to wash your hair, wash first with your normal shampoo.  After you use shampoo/soap, rinse your hair and body thoroughly to remove shampoo/soap  residue.  Turn the water OFF and apply half the bottle of CHG soap to a CLEAN washcloth.  Apply CHG soap ONLY FROM YOUR NECK DOWN TO YOUR TOES (washing for 3-5 minutes)  DO NOT use CHG soap on face, private areas, open wounds, or sores.  Pay special attention to the area where your surgery is being performed.  If you are having back surgery, having someone wash your back for you may be helpful. Wait 2 minutes after CHG soap is applied, then you may rinse off the CHG soap.  Pat dry with a clean towel  Put on clean pajamas    Additional instructions for the day of surgery: DO NOT APPLY any lotions, deodorants, cologne, or perfumes.   Do not wear jewelry or makeup Do not wear nail polish, gel polish,  artificial nails, or any other type of covering on natural nails (fingers and toes) Do not bring valuables to the hospital. [facility] is not responsible for valuables/personal belongings. Put on clean/comfortable clothes.  Please brush your teeth.  Ask your nurse before applying any prescription medications to the skin.

## 2024-05-07 NOTE — Anesthesia Preprocedure Evaluation (Addendum)
 Anesthesia Evaluation  Patient identified by MRN, date of birth, ID band Patient awake    Reviewed: Allergy & Precautions, NPO status , Patient's Chart, lab work & pertinent test results  History of Anesthesia Complications Negative for: history of anesthetic complications  Airway Mallampati: III  TM Distance: >3 FB     Dental no notable dental hx.    Pulmonary neg COPD, former smoker   breath sounds clear to auscultation       Cardiovascular hypertension, + Peripheral Vascular Disease   Rhythm:Regular Rate:Normal     Neuro/Psych dementia  Neuromuscular disease CVA, No Residual Symptoms    GI/Hepatic ,GERD  ,,(+) neg Cirrhosis        Endo/Other  diabetes, Type 2    Renal/GU Renal disease     Musculoskeletal   Abdominal   Peds  Hematology  (+) Blood dyscrasia, anemia   Anesthesia Other Findings   Reproductive/Obstetrics                              Anesthesia Physical Anesthesia Plan  ASA: 3  Anesthesia Plan: General   Post-op Pain Management:    Induction: Intravenous  PONV Risk Score and Plan: 2 and Ondansetron   Airway Management Planned: Oral ETT  Additional Equipment: Arterial line, Ultrasound Guidance Line Placement, Spinal Drain and CVP  Intra-op Plan:   Post-operative Plan: Extubation in OR and Possible Post-op intubation/ventilation  Informed Consent:      Dental advisory given  Plan Discussed with:   Anesthesia Plan Comments: (PAT note by Lynwood Hope, PA-C: 78 yo male with pertinent hx including former smoker, chronic back pain, GERD on PPI, hypertension, hyperlipidemia, NIDDM2, 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  Recent admission 7/12-7/17/25 after presenting with worsening confusion and generalized weakness. MRI negative, CVA ruled out. EEG was suggestive of mild diffuse encephalopathy with no  seizures or epileptiform discharges seen throughout the recording. Mentation improved to baseline, enecphalopathy felt possibly related to uncontrolled htn. Resumed home regimen of Carvedilol  25 mg po BID, Amlodipine  2.5 mg po Daily, and Hydralazine  10 mg q6h.   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 Vascular consulted and recommended outpatient f/u to consider intervention. He was seen by Dr. Sheree on 04/08/24 and [facility] repair was recommended.  Preop labs reviewed, mild anemia Hgb 12.0. DM2 well controlled A1c 5.8.  EKG 03/03/24: Sinus bradycardia. Rate 57.  TTE 03/02/24: 1. Left ventricular ejection fraction, by estimation, is 60 to 65%. The  left ventricle has normal function. The left ventricle has no regional  wall motion abnormalities. Left ventricular diastolic parameters were  normal.  2. Right ventricular systolic function is normal. The right ventricular  size is normal. There is normal pulmonary artery systolic pressure. The  estimated right ventricular systolic pressure is 30.7 mmHg.  3. The mitral valve is degenerative. Mild mitral valve regurgitation. No  evidence of mitral stenosis.  4. The aortic valve is tricuspid. Aortic valve regurgitation is mild to  moderate. No aortic stenosis is present.  5. 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  Aortic dilatation noted. Aneurysm of the ascending aorta, measuring 50 mm.  There is dilatation of the aortic arch, measuring 38 mm.  6. The inferior vena cava is dilated in size with >50% respiratory  variability, suggesting right atrial pressure of 8 mmHg.   Conclusion(s)/Recommendation(s):  Recommend CTA or MRA of the chest,  abdomen, pelvis, to further evaluate the aorta. Abdominal aorta duplex to  evaluate for abdominal aortic aneurysm. Clinical correlation required.  Results conveyed to Dr. Alejandro Lazarus Marker via secure chat.   )          Anesthesia Quick Evaluation

## 2024-05-07 NOTE — Progress Notes (Signed)
 Left a message to pt's voicemail to arrive tom at 0820.

## 2024-05-07 NOTE — Progress Notes (Signed)
 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  Recent admission 7/12-7/17/25 after presenting with worsening confusion and generalized weakness. MRI negative, CVA ruled out. EEG was suggestive of mild diffuse encephalopathy with no seizures or epileptiform discharges seen throughout the recording. Mentation improved to baseline, enecphalopathy felt possibly related to uncontrolled htn. Resumed home regimen of Carvedilol  25 mg po BID, Amlodipine  2.5 mg po Daily, and Hydralazine  10 mg q6h.   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 Vascular consulted and recommended outpatient f/u to consider intervention. He was seen by Dr. Sheree on 04/08/24 and [name] repair was recommended.  Preop labs reviewed, mild anemia Hgb 12.0. DM2 well controlled A1c 5.8.  EKG 03/03/24: Sinus bradycardia. Rate 57.  TTE 03/02/24:  1. Left ventricular ejection fraction, by estimation, is 60 to 65%. The  left ventricle has normal function. The left ventricle has no regional  wall motion abnormalities. Left ventricular diastolic parameters were  normal.   2. Right ventricular systolic function is normal. The right ventricular  size is normal. There is normal pulmonary artery systolic pressure. The  estimated right ventricular systolic pressure is 30.7 mmHg.   3. The mitral valve is degenerative. Mild mitral valve regurgitation. No  evidence of mitral stenosis.   4. The aortic valve is tricuspid. Aortic valve regurgitation is mild to  moderate. No aortic stenosis is present.   5. 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  Aortic dilatation noted. Aneurysm of the ascending aorta, measuring 50 mm.  There is dilatation of the aortic arch,  measuring 38 mm.   6. The inferior vena cava is dilated in size with >50% respiratory  variability, suggesting right atrial pressure of 8 mmHg.   Conclusion(s)/Recommendation(s): Recommend CTA or MRA of the chest,  abdomen, pelvis, to further evaluate the aorta. Abdominal aorta duplex to  evaluate for abdominal aortic aneurysm. Clinical correlation required.  Results conveyed to Dr. Alejandro Lazarus Marker via secure chat.     [place], [place]/Anesthesiology Phone [phone] 05/07/2024 9:47 AM

## 2024-05-08 ENCOUNTER — Other Ambulatory Visit: Payer: Self-pay

## 2024-05-08 ENCOUNTER — Inpatient Hospital Stay (HOSPITAL_COMMUNITY): Payer: Self-pay | Admitting: Certified Registered Nurse Anesthetist

## 2024-05-08 ENCOUNTER — Inpatient Hospital Stay (HOSPITAL_COMMUNITY)
Admission: RE | Admit: 2024-05-08 | Discharge: 2024-05-17 | DRG: 219 | Disposition: A | Discharge status: Rehabilitation Facility | Attending: Vascular Surgery | Admitting: Vascular Surgery

## 2024-05-08 ENCOUNTER — Encounter (HOSPITAL_COMMUNITY): Payer: Self-pay | Admitting: Vascular Surgery

## 2024-05-08 ENCOUNTER — Inpatient Hospital Stay (HOSPITAL_COMMUNITY)

## 2024-05-08 ENCOUNTER — Encounter (HOSPITAL_COMMUNITY)
Admission: RE | Disposition: A | Discharge status: Rehabilitation Facility | Payer: Self-pay | Source: Home / Self Care | Attending: Vascular Surgery

## 2024-05-08 DIAGNOSIS — I959 Hypotension, unspecified: Secondary | ICD-10-CM | POA: Diagnosis not present

## 2024-05-08 DIAGNOSIS — R471 Dysarthria and anarthria: Secondary | ICD-10-CM | POA: Diagnosis not present

## 2024-05-08 DIAGNOSIS — N179 Acute kidney failure, unspecified: Secondary | ICD-10-CM | POA: Diagnosis not present

## 2024-05-08 DIAGNOSIS — D696 Thrombocytopenia, unspecified: Secondary | ICD-10-CM | POA: Diagnosis present

## 2024-05-08 DIAGNOSIS — M5124 Other intervertebral disc displacement, thoracic region: Secondary | ICD-10-CM | POA: Diagnosis not present

## 2024-05-08 DIAGNOSIS — Z860100 Personal history of colon polyps, unspecified: Secondary | ICD-10-CM

## 2024-05-08 DIAGNOSIS — I7103 Dissection of thoracoabdominal aorta: Secondary | ICD-10-CM

## 2024-05-08 DIAGNOSIS — I639 Cerebral infarction, unspecified: Secondary | ICD-10-CM | POA: Diagnosis not present

## 2024-05-08 DIAGNOSIS — R9089 Other abnormal findings on diagnostic imaging of central nervous system: Secondary | ICD-10-CM | POA: Diagnosis not present

## 2024-05-08 DIAGNOSIS — I10 Essential (primary) hypertension: Secondary | ICD-10-CM | POA: Diagnosis present

## 2024-05-08 DIAGNOSIS — F0390 Unspecified dementia without behavioral disturbance: Secondary | ICD-10-CM | POA: Diagnosis present

## 2024-05-08 DIAGNOSIS — N183 Chronic kidney disease, stage 3 unspecified: Secondary | ICD-10-CM

## 2024-05-08 DIAGNOSIS — Z7902 Long term (current) use of antithrombotics/antiplatelets: Secondary | ICD-10-CM

## 2024-05-08 DIAGNOSIS — I69354 Hemiplegia and hemiparesis following cerebral infarction affecting left non-dominant side: Secondary | ICD-10-CM | POA: Diagnosis not present

## 2024-05-08 DIAGNOSIS — M5021 Other cervical disc displacement,  high cervical region: Secondary | ICD-10-CM | POA: Diagnosis not present

## 2024-05-08 DIAGNOSIS — F17210 Nicotine dependence, cigarettes, uncomplicated: Secondary | ICD-10-CM | POA: Diagnosis present

## 2024-05-08 DIAGNOSIS — D72829 Elevated white blood cell count, unspecified: Secondary | ICD-10-CM | POA: Diagnosis not present

## 2024-05-08 DIAGNOSIS — M4802 Spinal stenosis, cervical region: Secondary | ICD-10-CM | POA: Diagnosis not present

## 2024-05-08 DIAGNOSIS — I63549 Cerebral infarction due to unspecified occlusion or stenosis of unspecified cerebellar artery: Secondary | ICD-10-CM | POA: Diagnosis not present

## 2024-05-08 DIAGNOSIS — M4804 Spinal stenosis, thoracic region: Secondary | ICD-10-CM | POA: Diagnosis not present

## 2024-05-08 DIAGNOSIS — Z7984 Long term (current) use of oral hypoglycemic drugs: Secondary | ICD-10-CM

## 2024-05-08 DIAGNOSIS — I63233 Cerebral infarction due to unspecified occlusion or stenosis of bilateral carotid arteries: Secondary | ICD-10-CM | POA: Diagnosis not present

## 2024-05-08 DIAGNOSIS — Z452 Encounter for adjustment and management of vascular access device: Secondary | ICD-10-CM | POA: Diagnosis not present

## 2024-05-08 DIAGNOSIS — R5381 Other malaise: Secondary | ICD-10-CM | POA: Diagnosis not present

## 2024-05-08 DIAGNOSIS — I63523 Cerebral infarction due to unspecified occlusion or stenosis of bilateral anterior cerebral arteries: Secondary | ICD-10-CM | POA: Diagnosis not present

## 2024-05-08 DIAGNOSIS — M549 Dorsalgia, unspecified: Secondary | ICD-10-CM | POA: Diagnosis present

## 2024-05-08 DIAGNOSIS — G8918 Other acute postprocedural pain: Secondary | ICD-10-CM | POA: Diagnosis not present

## 2024-05-08 DIAGNOSIS — M48061 Spinal stenosis, lumbar region without neurogenic claudication: Secondary | ICD-10-CM | POA: Diagnosis not present

## 2024-05-08 DIAGNOSIS — G319 Degenerative disease of nervous system, unspecified: Secondary | ICD-10-CM | POA: Diagnosis not present

## 2024-05-08 DIAGNOSIS — Z87891 Personal history of nicotine dependence: Secondary | ICD-10-CM | POA: Diagnosis not present

## 2024-05-08 DIAGNOSIS — Z8679 Personal history of other diseases of the circulatory system: Secondary | ICD-10-CM | POA: Diagnosis not present

## 2024-05-08 DIAGNOSIS — M47814 Spondylosis without myelopathy or radiculopathy, thoracic region: Secondary | ICD-10-CM | POA: Diagnosis not present

## 2024-05-08 DIAGNOSIS — R29706 NIHSS score 6: Secondary | ICD-10-CM | POA: Diagnosis not present

## 2024-05-08 DIAGNOSIS — N400 Enlarged prostate without lower urinary tract symptoms: Secondary | ICD-10-CM | POA: Diagnosis present

## 2024-05-08 DIAGNOSIS — K219 Gastro-esophageal reflux disease without esophagitis: Secondary | ICD-10-CM | POA: Diagnosis present

## 2024-05-08 DIAGNOSIS — E785 Hyperlipidemia, unspecified: Secondary | ICD-10-CM | POA: Diagnosis present

## 2024-05-08 DIAGNOSIS — I63441 Cerebral infarction due to embolism of right cerebellar artery: Secondary | ICD-10-CM | POA: Diagnosis not present

## 2024-05-08 DIAGNOSIS — I6782 Cerebral ischemia: Secondary | ICD-10-CM | POA: Diagnosis not present

## 2024-05-08 DIAGNOSIS — I739 Peripheral vascular disease, unspecified: Secondary | ICD-10-CM | POA: Diagnosis not present

## 2024-05-08 DIAGNOSIS — R29898 Other symptoms and signs involving the musculoskeletal system: Secondary | ICD-10-CM | POA: Diagnosis not present

## 2024-05-08 DIAGNOSIS — I716 Thoracoabdominal aortic aneurysm, without rupture, unspecified: Principal | ICD-10-CM | POA: Diagnosis present

## 2024-05-08 DIAGNOSIS — Z7982 Long term (current) use of aspirin: Secondary | ICD-10-CM

## 2024-05-08 DIAGNOSIS — I9789 Other postprocedural complications and disorders of the circulatory system, not elsewhere classified: Secondary | ICD-10-CM | POA: Diagnosis not present

## 2024-05-08 DIAGNOSIS — M50221 Other cervical disc displacement at C4-C5 level: Secondary | ICD-10-CM | POA: Diagnosis not present

## 2024-05-08 DIAGNOSIS — Z981 Arthrodesis status: Secondary | ICD-10-CM

## 2024-05-08 DIAGNOSIS — I129 Hypertensive chronic kidney disease with stage 1 through stage 4 chronic kidney disease, or unspecified chronic kidney disease: Secondary | ICD-10-CM

## 2024-05-08 DIAGNOSIS — D62 Acute posthemorrhagic anemia: Secondary | ICD-10-CM | POA: Diagnosis not present

## 2024-05-08 DIAGNOSIS — I97821 Postprocedural cerebrovascular infarction during other surgery: Secondary | ICD-10-CM | POA: Diagnosis not present

## 2024-05-08 DIAGNOSIS — M47812 Spondylosis without myelopathy or radiculopathy, cervical region: Secondary | ICD-10-CM | POA: Diagnosis not present

## 2024-05-08 DIAGNOSIS — Z9889 Other specified postprocedural states: Secondary | ICD-10-CM | POA: Diagnosis not present

## 2024-05-08 DIAGNOSIS — N3001 Acute cystitis with hematuria: Secondary | ICD-10-CM | POA: Diagnosis not present

## 2024-05-08 DIAGNOSIS — Z01818 Encounter for other preprocedural examination: Secondary | ICD-10-CM

## 2024-05-08 DIAGNOSIS — I714 Abdominal aortic aneurysm, without rupture, unspecified: Secondary | ICD-10-CM

## 2024-05-08 DIAGNOSIS — Z882 Allergy status to sulfonamides status: Secondary | ICD-10-CM

## 2024-05-08 DIAGNOSIS — I7102 Dissection of abdominal aorta: Secondary | ICD-10-CM | POA: Diagnosis not present

## 2024-05-08 DIAGNOSIS — G379 Demyelinating disease of central nervous system, unspecified: Secondary | ICD-10-CM | POA: Diagnosis not present

## 2024-05-08 DIAGNOSIS — E1122 Type 2 diabetes mellitus with diabetic chronic kidney disease: Secondary | ICD-10-CM | POA: Diagnosis not present

## 2024-05-08 DIAGNOSIS — Z79899 Other long term (current) drug therapy: Secondary | ICD-10-CM

## 2024-05-08 DIAGNOSIS — E1151 Type 2 diabetes mellitus with diabetic peripheral angiopathy without gangrene: Secondary | ICD-10-CM | POA: Diagnosis present

## 2024-05-08 DIAGNOSIS — Z7409 Other reduced mobility: Secondary | ICD-10-CM | POA: Diagnosis present

## 2024-05-08 DIAGNOSIS — I7142 Juxtarenal abdominal aortic aneurysm, without rupture: Secondary | ICD-10-CM | POA: Diagnosis present

## 2024-05-08 DIAGNOSIS — H53002 Unspecified amblyopia, left eye: Secondary | ICD-10-CM | POA: Diagnosis present

## 2024-05-08 DIAGNOSIS — Z4682 Encounter for fitting and adjustment of non-vascular catheter: Secondary | ICD-10-CM | POA: Diagnosis not present

## 2024-05-08 DIAGNOSIS — E1169 Type 2 diabetes mellitus with other specified complication: Secondary | ICD-10-CM | POA: Diagnosis not present

## 2024-05-08 DIAGNOSIS — Z794 Long term (current) use of insulin: Secondary | ICD-10-CM | POA: Diagnosis not present

## 2024-05-08 DIAGNOSIS — I6389 Other cerebral infarction: Secondary | ICD-10-CM | POA: Diagnosis not present

## 2024-05-08 DIAGNOSIS — Z888 Allergy status to other drugs, medicaments and biological substances status: Secondary | ICD-10-CM

## 2024-05-08 DIAGNOSIS — G8194 Hemiplegia, unspecified affecting left nondominant side: Secondary | ICD-10-CM | POA: Diagnosis not present

## 2024-05-08 DIAGNOSIS — Z87442 Personal history of urinary calculi: Secondary | ICD-10-CM

## 2024-05-08 DIAGNOSIS — I6521 Occlusion and stenosis of right carotid artery: Secondary | ICD-10-CM | POA: Diagnosis not present

## 2024-05-08 DIAGNOSIS — Z95828 Presence of other vascular implants and grafts: Secondary | ICD-10-CM | POA: Diagnosis not present

## 2024-05-08 DIAGNOSIS — G8929 Other chronic pain: Secondary | ICD-10-CM | POA: Diagnosis present

## 2024-05-08 DIAGNOSIS — R531 Weakness: Secondary | ICD-10-CM | POA: Diagnosis not present

## 2024-05-08 DIAGNOSIS — R270 Ataxia, unspecified: Secondary | ICD-10-CM | POA: Diagnosis not present

## 2024-05-08 DIAGNOSIS — I63541 Cerebral infarction due to unspecified occlusion or stenosis of right cerebellar artery: Secondary | ICD-10-CM | POA: Diagnosis not present

## 2024-05-08 DIAGNOSIS — I71019 Dissection of thoracic aorta, unspecified: Secondary | ICD-10-CM | POA: Diagnosis not present

## 2024-05-08 HISTORY — PX: ULTRASOUND GUIDANCE FOR VASCULAR ACCESS: SHX6516

## 2024-05-08 HISTORY — PX: THORACOABDOMINAL AORTIC ANEURYSM REPAIR: SHX2504

## 2024-05-08 HISTORY — DX: Type 2 diabetes mellitus without complications: E11.9

## 2024-05-08 LAB — ELECTROLYTE PANEL
Anion gap: 12 (ref 5–15)
CO2: 24 mmol/L (ref 22–32)
Chloride: 106 mmol/L (ref 98–111)
Potassium: 3.9 mmol/L (ref 3.5–5.1)
Sodium: 142 mmol/L (ref 135–145)

## 2024-05-08 LAB — SURGICAL PCR SCREEN
MRSA, PCR: NEGATIVE
Staphylococcus aureus: NEGATIVE

## 2024-05-08 LAB — POCT I-STAT 7, (LYTES, BLD GAS, ICA,H+H)
Acid-Base Excess: 0 mmol/L (ref 0.0–2.0)
Acid-Base Excess: 0 mmol/L (ref 0.0–2.0)
Acid-base deficit: 2 mmol/L (ref 0.0–2.0)
Bicarbonate: 24.8 mmol/L (ref 20.0–28.0)
Bicarbonate: 25.2 mmol/L (ref 20.0–28.0)
Bicarbonate: 22.8 mmol/L (ref 20.0–28.0)
Calcium, Ion: 1.17 mmol/L (ref 1.15–1.40)
Calcium, Ion: 1.17 mmol/L (ref 1.15–1.40)
Calcium, Ion: 1.16 mmol/L (ref 1.15–1.40)
HCT: 25 % — ABNORMAL LOW (ref 39.0–52.0)
HCT: 25 % — ABNORMAL LOW (ref 39.0–52.0)
HCT: 31 % — ABNORMAL LOW (ref 39.0–52.0)
Hemoglobin: 8.5 g/dL — ABNORMAL LOW (ref 13.0–17.0)
Hemoglobin: 8.5 g/dL — ABNORMAL LOW (ref 13.0–17.0)
Hemoglobin: 10.5 g/dL — ABNORMAL LOW (ref 13.0–17.0)
O2 Saturation: 99 %
O2 Saturation: 99 %
O2 Saturation: 93 %
Patient temperature: 34.9
Patient temperature: 34.8
Patient temperature: 97.5
Potassium: 3.7 mmol/L (ref 3.5–5.1)
Potassium: 4 mmol/L (ref 3.5–5.1)
Potassium: 4.2 mmol/L (ref 3.5–5.1)
Sodium: 143 mmol/L (ref 135–145)
Sodium: 142 mmol/L (ref 135–145)
Sodium: 141 mmol/L (ref 135–145)
TCO2: 26 mmol/L (ref 22–32)
TCO2: 27 mmol/L (ref 22–32)
TCO2: 24 mmol/L (ref 22–32)
pCO2 arterial: 38.4 mmHg (ref 32–48)
pCO2 arterial: 40.4 mmHg (ref 32–48)
pCO2 arterial: 36 mmHg (ref 32–48)
pH, Arterial: 7.409 (ref 7.35–7.45)
pH, Arterial: 7.393 (ref 7.35–7.45)
pH, Arterial: 7.406 (ref 7.35–7.45)
pO2, Arterial: 140 mmHg — ABNORMAL HIGH (ref 83–108)
pO2, Arterial: 128 mmHg — ABNORMAL HIGH (ref 83–108)
pO2, Arterial: 63 mmHg — ABNORMAL LOW (ref 83–108)

## 2024-05-08 LAB — POCT ACTIVATED CLOTTING TIME
Activated Clotting Time: 199 s
Activated Clotting Time: 245 s
Activated Clotting Time: 268 s
Activated Clotting Time: 239 s
Activated Clotting Time: 262 s
Activated Clotting Time: 279 s
Activated Clotting Time: 239 s

## 2024-05-08 LAB — GLUCOSE, CAPILLARY
Glucose-Capillary: 125 mg/dL — ABNORMAL HIGH (ref 70–99)
Glucose-Capillary: 159 mg/dL — ABNORMAL HIGH (ref 70–99)
Glucose-Capillary: 169 mg/dL — ABNORMAL HIGH (ref 70–99)
Glucose-Capillary: 178 mg/dL — ABNORMAL HIGH (ref 70–99)

## 2024-05-08 LAB — CBC
HCT: 32.9 % — ABNORMAL LOW (ref 39.0–52.0)
Hemoglobin: 11 g/dL — ABNORMAL LOW (ref 13.0–17.0)
MCH: 29.2 pg (ref 26.0–34.0)
MCHC: 33.4 g/dL (ref 30.0–36.0)
MCV: 87.3 fL (ref 80.0–100.0)
Platelets: 127 K/uL — ABNORMAL LOW (ref 150–400)
RBC: 3.77 MIL/uL — ABNORMAL LOW (ref 4.22–5.81)
RDW: 15.9 % — ABNORMAL HIGH (ref 11.5–15.5)
WBC: 12.1 K/uL — ABNORMAL HIGH (ref 4.0–10.5)
nRBC: 0 % (ref 0.0–0.2)

## 2024-05-08 LAB — CREATININE, SERUM
Creatinine, Ser: 1.28 mg/dL — ABNORMAL HIGH (ref 0.61–1.24)
GFR, Estimated: 57 mL/min — ABNORMAL LOW (ref >60)

## 2024-05-08 LAB — PROTIME-INR
INR: 1.3 — ABNORMAL HIGH (ref 0.8–1.2)
Prothrombin Time: 16.4 s — ABNORMAL HIGH (ref 11.4–15.2)

## 2024-05-08 LAB — PREPARE RBC (CROSSMATCH)

## 2024-05-08 LAB — FIBRINOGEN: Fibrinogen: 230 mg/dL (ref 210–475)

## 2024-05-08 LAB — MRSA NEXT GEN BY PCR, NASAL: MRSA by PCR Next Gen: NOT DETECTED

## 2024-05-08 LAB — APTT: aPTT: 34 s (ref 24–36)

## 2024-05-08 SURGERY — REPAIR, ANEURYSM, AORTA, THORACOABDOMINAL
Anesthesia: General | Site: Groin | Laterality: Right

## 2024-05-08 MED ORDER — ADULT MULTIVITAMIN W/MINERALS CH
1.0000 | ORAL_TABLET | Freq: Every day | ORAL | Status: DC
Start: 2024-05-08 — End: 2024-05-17
  Administered 2024-05-08 – 2024-05-17 (×10): 1 via ORAL
  Filled 2024-05-08 (×10): qty 1

## 2024-05-08 MED ORDER — PHENYLEPHRINE HCL-NACL 20-0.9 MG/250ML-% IV SOLN
INTRAVENOUS | Status: DC | PRN
  Administered 2024-05-08: 20 ug/min via INTRAVENOUS

## 2024-05-08 MED ORDER — HEPARIN SODIUM (PORCINE) 1000 UNIT/ML IJ SOLN
INTRAMUSCULAR | Status: DC | PRN
  Administered 2024-05-08 (×2): 5000 [IU] via INTRAVENOUS
  Administered 2024-05-08: 10000 [IU] via INTRAVENOUS
  Administered 2024-05-08: 8000 [IU] via INTRAVENOUS

## 2024-05-08 MED ORDER — FENTANYL CITRATE (PF) 100 MCG/2ML IJ SOLN
INTRAMUSCULAR | Status: AC
  Administered 2024-05-08: 50 ug via INTRAVENOUS
  Filled 2024-05-08: qty 2

## 2024-05-08 MED ORDER — FENTANYL CITRATE (PF) 250 MCG/5ML IJ SOLN
INTRAMUSCULAR | Status: DC | PRN
  Administered 2024-05-08 (×3): 50 ug via INTRAVENOUS
  Administered 2024-05-08: 100 ug via INTRAVENOUS

## 2024-05-08 MED ORDER — IODIXANOL 320 MG/ML IV SOLN
INTRAVENOUS | Status: DC | PRN
  Administered 2024-05-08: 150 mL via INTRA_ARTERIAL
  Administered 2024-05-08: 1 mL via INTRA_ARTERIAL
  Administered 2024-05-08: 20 mL via INTRA_ARTERIAL

## 2024-05-08 MED ORDER — MIDAZOLAM HCL 2 MG/2ML IJ SOLN: 0.5000 mg | Freq: Once | INTRAMUSCULAR | Status: AC

## 2024-05-08 MED ORDER — CHLORHEXIDINE GLUCONATE CLOTH 2 % EX PADS
6.0000 | MEDICATED_PAD | Freq: Every day | CUTANEOUS | Status: DC
Start: 2024-05-09 — End: 2024-05-13
  Administered 2024-05-09 – 2024-05-12 (×4): 6 via TOPICAL

## 2024-05-08 MED ORDER — ORAL CARE MOUTH RINSE: 15.0000 mL | Freq: Once | OROMUCOSAL | Status: AC

## 2024-05-08 MED ORDER — HYDROMORPHONE HCL 1 MG/ML IJ SOLN
0.5000 mg | INTRAMUSCULAR | Status: DC | PRN
  Administered 2024-05-08: 0.5 mg via INTRAVENOUS
  Filled 2024-05-08: qty 0.5

## 2024-05-08 MED ORDER — HEPARIN 6000 UNIT IRRIGATION SOLUTION
Status: DC | PRN
  Administered 2024-05-08 (×2): 1

## 2024-05-08 MED ORDER — ALBUMIN HUMAN 5 % IV SOLN: INTRAVENOUS | Status: DC | PRN

## 2024-05-08 MED ORDER — CEFAZOLIN SODIUM-DEXTROSE 2-4 GM/100ML-% IV SOLN
2.0000 g | Freq: Three times a day (TID) | INTRAVENOUS | Status: AC
  Administered 2024-05-08 – 2024-05-09 (×2): 2 g via INTRAVENOUS
  Filled 2024-05-08 (×2): qty 100

## 2024-05-08 MED ORDER — FENTANYL CITRATE (PF) 100 MCG/2ML IJ SOLN
50.0000 ug | Freq: Once | INTRAMUSCULAR | Status: AC
  Administered 2024-05-08: 50 ug via INTRAVENOUS

## 2024-05-08 MED ORDER — ROCURONIUM BROMIDE 100 MG/10ML IV SOLN
INTRAVENOUS | Status: DC | PRN
  Administered 2024-05-08: 100 mg via INTRAVENOUS
  Administered 2024-05-08: 40 mg via INTRAVENOUS
  Administered 2024-05-08: 30 mg via INTRAVENOUS
  Administered 2024-05-08: 20 mg via INTRAVENOUS
  Administered 2024-05-08: 50 mg via INTRAVENOUS

## 2024-05-08 MED ORDER — TAMSULOSIN HCL 0.4 MG PO CAPS
0.4000 mg | ORAL_CAPSULE | Freq: Every day | ORAL | Status: DC
  Administered 2024-05-09 – 2024-05-17 (×9): 0.4 mg via ORAL
  Filled 2024-05-08 (×9): qty 1

## 2024-05-08 MED ORDER — CHLORHEXIDINE GLUCONATE CLOTH 2 % EX PADS: 6.0000 | MEDICATED_PAD | Freq: Once | CUTANEOUS | Status: DC

## 2024-05-08 MED ORDER — SUGAMMADEX SODIUM 200 MG/2ML IV SOLN
INTRAVENOUS | Status: DC | PRN
  Administered 2024-05-08: 200 mg via INTRAVENOUS

## 2024-05-08 MED ORDER — FENTANYL CITRATE (PF) 100 MCG/2ML IJ SOLN: 50.0000 ug | Freq: Once | INTRAMUSCULAR | Status: AC

## 2024-05-08 MED ORDER — PHENOL 1.4 % MT LIQD: 1.0000 | OROMUCOSAL | Status: DC | PRN

## 2024-05-08 MED ORDER — 0.9 % SODIUM CHLORIDE (POUR BTL) OPTIME: TOPICAL | Status: DC | PRN

## 2024-05-08 MED ORDER — INSULIN ASPART 100 UNIT/ML IJ SOLN
0.0000 [IU] | Freq: Three times a day (TID) | INTRAMUSCULAR | Status: DC
  Administered 2024-05-09: 2 [IU] via SUBCUTANEOUS
  Administered 2024-05-09: 1 [IU] via SUBCUTANEOUS
  Administered 2024-05-10: 2 [IU] via SUBCUTANEOUS
  Administered 2024-05-10: 1 [IU] via SUBCUTANEOUS
  Administered 2024-05-10: 2 [IU] via SUBCUTANEOUS
  Administered 2024-05-11 – 2024-05-14 (×10): 1 [IU] via SUBCUTANEOUS
  Administered 2024-05-15: 2 [IU] via SUBCUTANEOUS
  Administered 2024-05-15: 1 [IU] via SUBCUTANEOUS
  Administered 2024-05-16: 2 [IU] via SUBCUTANEOUS

## 2024-05-08 MED ORDER — PROPOFOL 500 MG/50ML IV EMUL
INTRAVENOUS | Status: DC | PRN
  Administered 2024-05-08: 40 ug/kg/min via INTRAVENOUS

## 2024-05-08 MED ORDER — ROSUVASTATIN CALCIUM 5 MG PO TABS
10.0000 mg | ORAL_TABLET | Freq: Every day | ORAL | Status: DC
  Administered 2024-05-08 – 2024-05-16 (×9): 10 mg via ORAL
  Filled 2024-05-08 (×9): qty 2

## 2024-05-08 MED ORDER — DOCUSATE SODIUM 100 MG PO CAPS
100.0000 mg | ORAL_CAPSULE | Freq: Every day | ORAL | Status: DC
  Administered 2024-05-09 – 2024-05-17 (×9): 100 mg via ORAL
  Filled 2024-05-08 (×9): qty 1

## 2024-05-08 MED ORDER — EPHEDRINE SULFATE (PRESSORS) 50 MG/ML IJ SOLN
INTRAMUSCULAR | Status: DC | PRN
Start: 2024-05-08 — End: 2024-05-08
  Administered 2024-05-08 (×4): 5 mg via INTRAVENOUS

## 2024-05-08 MED ORDER — HEPARIN SODIUM (PORCINE) 5000 UNIT/ML IJ SOLN
5000.0000 [IU] | Freq: Two times a day (BID) | INTRAMUSCULAR | Status: DC
  Administered 2024-05-09 – 2024-05-10 (×3): 5000 [IU] via SUBCUTANEOUS
  Filled 2024-05-08 (×3): qty 1

## 2024-05-08 MED ORDER — SODIUM CHLORIDE 0.9 % IV SOLN: 10.0000 mL/h | Freq: Once | INTRAVENOUS | Status: DC

## 2024-05-08 MED ORDER — DEXAMETHASONE SODIUM PHOSPHATE 10 MG/ML IJ SOLN
INTRAMUSCULAR | Status: DC | PRN
Start: 2024-05-08 — End: 2024-05-08
  Administered 2024-05-08: 10 mg via INTRAVENOUS

## 2024-05-08 MED ORDER — PROTAMINE SULFATE 10 MG/ML IV SOLN
INTRAVENOUS | Status: DC | PRN
  Administered 2024-05-08: 10 mg via INTRAVENOUS
  Administered 2024-05-08: 20 mg via INTRAVENOUS
  Administered 2024-05-08: 10 mg via INTRAVENOUS

## 2024-05-08 MED ORDER — BISACODYL 10 MG RE SUPP: 10.0000 mg | Freq: Every day | RECTAL | Status: DC | PRN

## 2024-05-08 MED ORDER — INSULIN ASPART 100 UNIT/ML IJ SOLN: 0.0000 [IU] | INTRAMUSCULAR | Status: DC | PRN

## 2024-05-08 MED ORDER — CHLORHEXIDINE GLUCONATE 0.12 % MT SOLN
15.0000 mL | Freq: Once | OROMUCOSAL | Status: AC
  Administered 2024-05-08: 15 mL via OROMUCOSAL
  Filled 2024-05-08: qty 15

## 2024-05-08 MED ORDER — OXYCODONE-ACETAMINOPHEN 5-325 MG PO TABS
1.0000 | ORAL_TABLET | ORAL | Status: DC | PRN
  Administered 2024-05-08 – 2024-05-09 (×2): 1 via ORAL
  Filled 2024-05-08 (×2): qty 1

## 2024-05-08 MED ORDER — LIDOCAINE HCL (CARDIAC) PF 100 MG/5ML IV SOSY
PREFILLED_SYRINGE | INTRAVENOUS | Status: DC | PRN
  Administered 2024-05-08: 100 mg via INTRATRACHEAL

## 2024-05-08 MED ORDER — METOPROLOL TARTRATE 5 MG/5ML IV SOLN: 2.5000 mg | INTRAVENOUS | Status: DC | PRN

## 2024-05-08 MED ORDER — MEMANTINE HCL 10 MG PO TABS
10.0000 mg | ORAL_TABLET | Freq: Two times a day (BID) | ORAL | Status: DC
  Administered 2024-05-08 – 2024-05-17 (×18): 10 mg via ORAL
  Filled 2024-05-08 (×19): qty 1

## 2024-05-08 MED ORDER — PANTOPRAZOLE SODIUM 40 MG PO TBEC
40.0000 mg | DELAYED_RELEASE_TABLET | Freq: Every day | ORAL | Status: DC
  Administered 2024-05-08 – 2024-05-17 (×10): 40 mg via ORAL
  Filled 2024-05-08 (×10): qty 1

## 2024-05-08 MED ORDER — ACETAMINOPHEN 650 MG RE SUPP: 325.0000 mg | RECTAL | Status: DC | PRN

## 2024-05-08 MED ORDER — FENTANYL CITRATE (PF) 250 MCG/5ML IJ SOLN
INTRAMUSCULAR | Status: AC
  Filled 2024-05-08: qty 5

## 2024-05-08 MED ORDER — PHENYLEPHRINE HCL (PRESSORS) 10 MG/ML IV SOLN
INTRAVENOUS | Status: DC | PRN
  Administered 2024-05-08: 80 ug via INTRAVENOUS

## 2024-05-08 MED ORDER — LACTATED RINGERS IV SOLN: INTRAVENOUS | Status: DC

## 2024-05-08 MED ORDER — HEMOSTATIC AGENTS (NO CHARGE) OPTIME
TOPICAL | Status: DC | PRN
  Administered 2024-05-08: 1 via TOPICAL

## 2024-05-08 MED ORDER — POLYETHYLENE GLYCOL 3350 17 G PO PACK
17.0000 g | PACK | Freq: Every day | ORAL | Status: DC | PRN
  Administered 2024-05-13 – 2024-05-15 (×2): 17 g via ORAL
  Filled 2024-05-08 (×2): qty 1

## 2024-05-08 MED ORDER — SODIUM CHLORIDE 0.9 % IV SOLN: INTRAVENOUS | Status: AC

## 2024-05-08 MED ORDER — LIDOCAINE 2% (20 MG/ML) 5 ML SYRINGE
INTRAMUSCULAR | Status: AC
  Filled 2024-05-08: qty 5

## 2024-05-08 MED ORDER — MIDAZOLAM HCL 2 MG/2ML IJ SOLN
INTRAMUSCULAR | Status: AC
  Administered 2024-05-08: 0.5 mg via INTRAVENOUS
  Filled 2024-05-08: qty 2

## 2024-05-08 MED ORDER — ONDANSETRON HCL 4 MG/2ML IJ SOLN
INTRAMUSCULAR | Status: DC | PRN
Start: 2024-05-08 — End: 2024-05-08
  Administered 2024-05-08: 4 mg via INTRAVENOUS

## 2024-05-08 MED ORDER — ASPIRIN 81 MG PO TBEC
81.0000 mg | DELAYED_RELEASE_TABLET | Freq: Every day | ORAL | Status: DC
  Administered 2024-05-09 – 2024-05-17 (×9): 81 mg via ORAL
  Filled 2024-05-08 (×9): qty 1

## 2024-05-08 MED ORDER — LACTATED RINGERS IV SOLN: INTRAVENOUS | Status: DC | PRN

## 2024-05-08 MED ORDER — SODIUM CHLORIDE 0.9 % IV SOLN: INTRAVENOUS | Status: DC

## 2024-05-08 MED ORDER — PROPOFOL 10 MG/ML IV BOLUS
INTRAVENOUS | Status: DC | PRN
Start: 2024-05-08 — End: 2024-05-08
  Administered 2024-05-08: 60 mg via INTRAVENOUS

## 2024-05-08 MED ORDER — HEPARIN 6000 UNIT IRRIGATION SOLUTION
Status: AC
  Filled 2024-05-08: qty 1000

## 2024-05-08 MED ORDER — POTASSIUM CHLORIDE CRYS ER 20 MEQ PO TBCR
40.0000 meq | EXTENDED_RELEASE_TABLET | Freq: Every day | ORAL | Status: DC | PRN
  Administered 2024-05-10: 40 meq via ORAL
  Filled 2024-05-08: qty 2

## 2024-05-08 MED ORDER — ACETAMINOPHEN 325 MG PO TABS
325.0000 mg | ORAL_TABLET | ORAL | Status: DC | PRN
  Administered 2024-05-10 (×3): 650 mg via ORAL
  Filled 2024-05-08 (×3): qty 2

## 2024-05-08 MED ORDER — CEFAZOLIN SODIUM-DEXTROSE 2-4 GM/100ML-% IV SOLN
2.0000 g | INTRAVENOUS | Status: AC
  Administered 2024-05-08 (×2): 2 g via INTRAVENOUS
  Filled 2024-05-08: qty 100

## 2024-05-08 SURGICAL SUPPLY — 82 items
BAG COUNTER SPONGE SURGICOUNT (BAG) ×2 IMPLANT
BALLOON MUSTANG 7X20X135 (BALLOONS) IMPLANT
CANISTER SUCTION 3000ML PPV (SUCTIONS) ×2 IMPLANT
CATH ANGIO 5F BER2 100CM (CATHETERS) IMPLANT
CATH BEACON 5 .035 65 KMP TIP (CATHETERS) IMPLANT
CATH OMNI FLUSH .035X70CM (CATHETERS) IMPLANT
CATH QUICKCROSS .035X135CM (MICROCATHETER) IMPLANT
CATH QUICKCROSS SUPP .035X90CM (MICROCATHETER) IMPLANT
CATH TRI-LUMEN GUIDE 140 12F (CATHETERS) IMPLANT
CATH VANSCH 5FR 6CM (CATHETERS) IMPLANT
CATH VISIONS PV .035 IVUS (CATHETERS) IMPLANT
CLIP LIGATING EXTRA MED SLVR (CLIP) ×2 IMPLANT
CLIP LIGATING EXTRA SM BLUE (MISCELLANEOUS) ×2 IMPLANT
CLIP TI MEDIUM 6 (CLIP) IMPLANT
CLIP TI WIDE RED SMALL 6 (CLIP) IMPLANT
CLOSURE PERCLOSE PROSTYLE (VASCULAR PRODUCTS) ×8 IMPLANT
DERMABOND ADVANCED .7 DNX12 (GAUZE/BANDAGES/DRESSINGS) ×2 IMPLANT
DEVICE ENSNARE 12MMX20MM (VASCULAR PRODUCTS) IMPLANT
DEVICE EXC TAMBE 37X20 22FR (Endovascular Graft) IMPLANT
DEVICE TORQUE H2O (MISCELLANEOUS) IMPLANT
DEVICE TORQUE KENDALL .025-038 (MISCELLANEOUS) IMPLANT
DRSG TEGADERM 2-3/8X2-3/4 SM (GAUZE/BANDAGES/DRESSINGS) ×4 IMPLANT
ELECTRODE REM PT RTRN 9FT ADLT (ELECTROSURGICAL) ×4 IMPLANT
EXCLUDER TNK LEG 23MX14X16 (Endovascular Graft) IMPLANT
GAUZE SPONGE 2X2 8PLY STRL LF (GAUZE/BANDAGES/DRESSINGS) ×4 IMPLANT
GLIDEWIRE ADV .035X260CM (WIRE) IMPLANT
GLIDEWIRE ANGLED NITR .018X260 (WIRE) IMPLANT
GLIDEWIRE ANGLED SS 035X260CM (WIRE) IMPLANT
GLOVE BIO SURGEON STRL SZ7.5 (GLOVE) ×2 IMPLANT
GOWN STRL REUS W/ TWL LRG LVL3 (GOWN DISPOSABLE) ×4 IMPLANT
GOWN STRL REUS W/ TWL XL LVL3 (GOWN DISPOSABLE) ×4 IMPLANT
KIT BASIN OR (CUSTOM PROCEDURE TRAY) ×2 IMPLANT
KIT ENCORE 26 ADVANTAGE (KITS) ×2 IMPLANT
KIT HEART LEFT (KITS) IMPLANT
KIT TURNOVER KIT B (KITS) ×2 IMPLANT
LOOP VASCLR MAXI BLUE 18IN ST (MISCELLANEOUS) IMPLANT
LOOP VESSEL MINI RED (MISCELLANEOUS) IMPLANT
NS IRRIG 1000ML POUR BTL (IV SOLUTION) ×2 IMPLANT
ORGANIZER SUTURE GABBAY-FRATER (MISCELLANEOUS) IMPLANT
PACK ENDOVASCULAR (PACKS) ×2 IMPLANT
PAD ARMBOARD POSITIONER FOAM (MISCELLANEOUS) ×4 IMPLANT
PENCIL SMOKE EVACUATOR (MISCELLANEOUS) IMPLANT
POWDER SURGICEL 3.0 GRAM (HEMOSTASIS) IMPLANT
SET MICROPUNCTURE 5F STIFF (MISCELLANEOUS) ×2 IMPLANT
SHEATH BRITE TIP 8FR 23CM (SHEATH) IMPLANT
SHEATH DRYSEAL FLEX 12FR 33CM (SHEATH) IMPLANT
SHEATH DRYSEAL FLEX 14FR 33CM (SHEATH) IMPLANT
SHEATH DRYSEAL FLEX 22FR 33CM (SHEATH) IMPLANT
SHEATH FLEXOR INTRODUCER 8FR (SHEATH) IMPLANT
SHEATH GUIDING 7F 55X73X9MM TD (SHEATH) IMPLANT
SHEATH GUIDING 8.5FR 67X17 (MISCELLANEOUS) IMPLANT
SHEATH HIGHFLEX ANSEL 7FR 55CM (SHEATH) ×2 IMPLANT
SHEATH PINNACLE 8F 10CM (SHEATH) ×2 IMPLANT
SHIELD RADPAD SCOOP 12X17 (MISCELLANEOUS) IMPLANT
SLEEVE ISOL F/PACE RF HD COVER (MISCELLANEOUS) IMPLANT
STENT GRAFT CONTRALAT 16X13.5 (Endovascular Graft) IMPLANT
STENT GRFT THORAC ACS 40X40X10 (Endovascular Graft) IMPLANT
STENT VIABAHN 7X59 6FR 135 (Permanent Stent) IMPLANT
STENT VIABAHN 7X79 6FR 135 (Permanent Stent) IMPLANT
STENT VIABAHN 9X59 7FR 135 (Permanent Stent) IMPLANT
STENT VIABAHN 9X79 7FR 135 (Permanent Stent) IMPLANT
STOPCOCK MORSE 400PSI 3WAY (MISCELLANEOUS) ×2 IMPLANT
SUT MNCRL AB 4-0 PS2 18 (SUTURE) ×4 IMPLANT
SUT PROLENE 5 0 C 1 24 (SUTURE) IMPLANT
SUT PROLENE 7 0 BV 1 (SUTURE) IMPLANT
SUT SILK 3-0 18XBRD TIE 12 (SUTURE) IMPLANT
SUT SILK 4-0 18XBRD TIE 12 (SUTURE) IMPLANT
SUT VIC AB 2-0 CT1 TAPERPNT 27 (SUTURE) IMPLANT
SUT VIC AB 3-0 SH 27X BRD (SUTURE) IMPLANT
SYR 30ML LL (SYRINGE) IMPLANT
SYR 50ML LL SCALE MARK (SYRINGE) IMPLANT
TOWEL GREEN STERILE (TOWEL DISPOSABLE) ×2 IMPLANT
TRAY FOLEY MTR SLVR 16FR STAT (SET/KITS/TRAYS/PACK) ×2 IMPLANT
TUBING HIGH PRESSURE 120CM (CONNECTOR) ×2 IMPLANT
TUBING INJECTOR 48 (MISCELLANEOUS) IMPLANT
WIRE AMPLATZ SS-J .035X260CM (WIRE) IMPLANT
WIRE BENTSON .035X145CM (WIRE) ×4 IMPLANT
WIRE G V18X300 ST (WIRE) IMPLANT
WIRE HYDRA 450CM (MISCELLANEOUS) IMPLANT
WIRE ROSEN-J .035X260CM (WIRE) ×4 IMPLANT
WIRE SPARTACORE .014X300CM (WIRE) IMPLANT
WIRE STIFF LUNDERQUIST 260MM (WIRE) ×4 IMPLANT

## 2024-05-08 NOTE — Transfer of Care (Signed)
 Immediate Anesthesia Transfer of Care Note  Patient: [name] [name]  Procedure(s) Performed: REPAIR, ANEURYSM, AORTA, THORACOABDOMINAL (Bilateral: Groin) ULTRASOUND GUIDANCE, FOR VASCULAR ACCESS (Bilateral: Groin) IVUS (INTRAVASCULAR ULTRASOUND) (Right: Groin)  Patient Location: PACU  Anesthesia Type:General  Level of Consciousness: drowsy  Airway & Oxygen Therapy: Patient Spontanous Breathing and Patient connected to face mask oxygen  Post-op Assessment: Report given to RN and Post -op Vital signs reviewed and stable  Post vital signs: Reviewed and stable  Last Vitals: see PACU postop VS flowsheet Vitals Value Taken Time  BP    Temp    Pulse    Resp    SpO2      Last Pain:  Vitals:   05/08/24 0816  TempSrc:   PainSc: 0-No pain      Patients Stated Pain Goal: 0 (05/08/24 0816)  Complications: No notable events documented.

## 2024-05-08 NOTE — Interval H&P Note (Signed)
 History and Physical Interval Note:  05/08/2024 7:53 AM  [name] [name]  has presented today for surgery, with the diagnosis of DISSECTION OF THORACOABD. AORTA.  The various methods of treatment have been discussed with the patient and family. After consideration of risks, benefits and other options for treatment, the patient has consented to  Procedure(s): REPAIR, ANEURYSM, AORTA, THORACOABDOMINAL (N/A) as a surgical intervention.  The patient's history has been reviewed, patient examined, no change in status, stable for surgery.  I have reviewed the patient's chart and labs.  Questions were answered to the patient's satisfaction.     [place]

## 2024-05-08 NOTE — Anesthesia Procedure Notes (Signed)
 Procedure Name: Intubation Date/Time: 05/08/2024 10:50 AM  Performed by: Zelphia Norleen HERO, CRNAPre-anesthesia Checklist: Patient identified, Emergency Drugs available, Suction available and Patient being monitored Patient Re-evaluated:Patient Re-evaluated prior to induction Oxygen Delivery Method: Circle system utilized Preoxygenation: Pre-oxygenation with 100% oxygen Induction Type: IV induction Ventilation: Mask ventilation without difficulty Laryngoscope Size: Mac and 3 Grade View: Grade I Tube type: Oral Tube size: 7.0 mm Number of attempts: 1 Airway Equipment and Method: Stylet and Oral airway Placement Confirmation: ETT inserted through vocal cords under direct vision, positive ETCO2 and breath sounds checked- equal and bilateral Secured at: 23 cm Tube secured with: Tape Dental Injury: Teeth and Oropharynx as per pre-operative assessment

## 2024-05-08 NOTE — Op Note (Signed)
 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 Sheree, MD  Co-surgeon: Malvina New, MD Procedure Performed: 1.  Thoracic aortic aneurysm repair with Gore conformable CTAG 40 x 40 x 10 cm 2.  Endovascular repair of juxtarenal aortic aneurysm with main body right 37 x 20 x 160 cm Gore excluder thoracoabdominal branched endoprosthesis extended with 23 x 14 x 16 cm Gore excluder and contralateral left limb 16 x 13.5 cm 3.  Stent of celiac artery with 9 x 59 mm VBX, stent of SMA with 9 x 79 mm VBX, stent left renal artery with two 7 x 79 mm VBX, stent of right renal artery with 7 x 59 and 7 x 79 mm VBX 4.  Open exposure left axillary artery 5.  Percutaneous ultrasound-guided cannulation and proglide closure bilateral common femoral arteries 6.  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  He has now been followed with known aneurysmal degeneration that now measures approximately 6 cm.  We have discussed proceeding with endovascular repair including the risks and benefits particularly the risk of spinal cord ischemia, stroke, lower extremity ischemia and he and his family demonstrate good understanding and consent has been signed.  A co-surgeon was necessary due to the multiple access points of this case including the bilateral common femoral arteries and left axillary artery in order to manipulate wires, catheters and stents expeditiously.  Findings: Bilateral common femoral arteries were large and amenable for percutaneous cannulation and closure.  The left axillary artery was also suitable for open exposure and placement of 14 French sheath.  The aneurysm was  sealed with device as noted above and a completion there was seal proximally into the existing graft and distally in the bilateral common iliac arteries with a late type II endoleak noted.  The SMA, celiac artery and renal artery stents were all patent.  The left subclavian artery had brisk flow into our access site and there was a palpable radial artery pulse at completion.  The bilateral dorsalis pedis pulses had strong signals at completion.  He was neurologically intact upon awakening from anesthesia.   Procedure:  The patient was identified in the holding area and taken to the operating room was placed supine per table and general anesthesia was induced.  He was sterilely prepped and draped in the bilateral neck and chest, abdomen and groins in usual fashion.  Timeout was called.  We concomitantly began using ultrasound guidance to cannulate the bilateral common femoral arteries in place ProGlide devices at 10 and 2:00.  The left chest 1 cm caudal to the clavicle a transverse incision was created we dissected down through the fascia of the pectoralis major and the muscle was then split bluntly.  The clavipectoral fascia was identified and incised.  The axillary vein was identified and branches were divided and this was reflected cephalad and the axillary artery underlying was identified encircled with vessel loop.  This was then cannulated with a micropuncture needle followed by wire and an 8 [place] sheath.  At this time the patient was fully heparinized and heparinization and ACT's were followed serially throughout the case.  From above we then placed a Jag wire into the ascending aorta and this was snared  from the right common femoral artery access for there was an 8 [place] sheath.  The Jagwire was then pulled through and through.  From above we then placed a 14 French sheath into the existing thoracic endograft device.  From below a 22 French sheath was placed to the right common femoral artery.  An 8  French sheath remained on the left side.  The device was then oriented and placed over the Jagwire up to the level of the 22 [place] sheath.  We then placed the 018 introducer catheter over the wire from the axillary access and we were able to ultimately feed for wires from above through the graft and these were are marked for orientation with their intended destination vessel.  The main body of the Tammy device was then dropped through the 22 French sheath maintaining wire orientation throughout and using fluoroscopic guidance.  This was placed up to the level of the existing TEVAR graft.  From the left side we then placed an Omni catheter and performed aortogram.  The graft was initially deployed at this level.  From above at the axillary access we then used 8 French sheaths over the through and through wires and we were easily able to first cannulate the left renal artery confirmed intraluminal access and placed a Rosen wire and then deployed a 7 x 79 mm VBX and extended this back into the graft with an additional 7 x 79 mm VBX.  The wires were then removed from the left renal artery.  We then used the through and through access to select the portal for the right renal artery.  Again we used a Kumpe catheter selective the right renal artery with Glidewire and confirmed intraluminal access and placed into the [employer] wire.  We then stented from the right renal artery back to the portal using a 7 x 79 mm VBX extended with 7 x 59 mm VBX.  Completion demonstrated that both of these renal arteries were patent with brisk flow distally and there was good seal into the portal.  Attention was then turned to the 2 mesenteric vessels.  Initially we selected the celiac wire but we were only able to get a wire into the SMA and we got a Rosen wire deep into the SMA and this was confirmed with angiography.  We did struggle to get access into the celiac artery likely due to impedance from the right renal artery VBX stent.   Ultimately over the through and through 018 wire we were able to get a steerable sheath and a Glidewire was placed into the celiac artery followed by 4 cross we confirmed intraluminal access and placed an Amplatz wire.  We then first stented the celiac artery with a 9 x 59 mm VBX.  The SMA was then confirmed again and we stented this with a 9 x 79 mm VBX and both of these were stented back to the portal level of the main body device.  Completion angiography demonstrated brisk flow through all of the branches stents.  All of the branch stent wires were then removed and only the Jagwire remained as through and through access.  We then extended between the [facility] device and the existing TEVAR device using a 40 x 40 x 10 cm C tag.  This was not ballooned but did appear to seal between the 2 stent grafts.  Attention was then turned toward extending distally.  Over the through and through J-wire we then brought a 23 x 14  x 16 cm Gore conformable EVAR graft.  We performed angiography identified of the right hypogastric artery and then deployed the graft.  We did have trouble cannulating the left gate.  From the 41 French sheath from above we then brought a Glidewire advantage down and this was snared and the left common iliac artery and brought through and through.  We then placed an Omni catheter over the Glidewire advantage up to the level of the overlap between the 2 conformable grafts and we were able to spin the Omni catheter.  We performed retrograde angiography to identify the left common iliac artery bifurcation where the hypogastric artery originated.  We then extended with a 16 x 13.5 cm device.  We then placed an Omni catheter up to the pre-existing TEVAR graft and performed completion angiography which demonstrated brisk flow into all of our branch stents and down to the bilateral hypogastric arteries.  There was maybe a late type II endoleak but all the graft appeared well opposed and we elected for no  ballooning.  With this attention was then turned to the left axillary artery.  The sheath was removed and the left axillary artery was clamped proximally and distally.  We did extend the arteriotomy slightly.  We then closed this with a running 5-0 Prolene suture.  Prior to completion flushing was performed in both directions.  Upon completion there was a very strong Doppler signal distal to arteriotomy.  Attention was then turned to the bilateral common femoral arteries where the Pro-glide devices were deployed and hemostasis was achieved.  There was strong dorsalis pedis signals at both feet and 40 mg of protamine  was administered.  Cautery was used to close the bilateral groin incisions.  The left axillary exposure incision was then irrigated and hemostasis was obtained we closed the fascial layer with running Vicryl suture followed by 4-0 Monocryl at the skin level and Dermabond was placed at the skin level.  Patient was then awakened from anesthesia and noted to be neurologically intact and transferred to the recovery area in stable condition.    Contrast: 170 cc  EBL: 1 L  Transfusion: 2 units packed red blood cells   Shemika Robbs C. Sheree, MD [facility] [place] Office: [phone] Pager: [phone]

## 2024-05-08 NOTE — Anesthesia Procedure Notes (Addendum)
 Arterial Line Insertion Start/End9/19/2025 10:49 AM, 05/08/2024 10:50 AM Performed by: Zelphia Norleen HERO, CRNA, CRNA  Patient location: Pre-op. Preanesthetic checklist: patient identified, IV checked, site marked, risks and benefits discussed, surgical consent, monitors and equipment checked, pre-op evaluation, timeout performed and anesthesia consent Lidocaine  1% used for infiltration Right, radial was placed Catheter size: 20 G Hand hygiene performed  and maximum sterile barriers used   Attempts: 1 Procedure performed using ultrasound guided technique. Ultrasound Notes:anatomy identified, needle tip was noted to be adjacent to the nerve/plexus identified and no ultrasound evidence of intravascular and/or intraneural injection Following insertion, dressing applied and Biopatch. Post procedure assessment: normal and unchanged  Patient tolerated the procedure well with no immediate complications.

## 2024-05-08 NOTE — Anesthesia Procedure Notes (Signed)
 Central Venous Catheter Insertion Performed by: Keneth Lynwood POUR, MD, anesthesiologist Start/End9/19/2025 10:50 AM, 05/08/2024 10:55 AM Patient location: Pre-op. Preanesthetic checklist: patient identified, IV checked, site marked, risks and benefits discussed, surgical consent, monitors and equipment checked, pre-op evaluation, timeout performed and anesthesia consent Position: Trendelenburg Lidocaine  1% used for infiltration and patient sedated Hand hygiene performed , maximum sterile barriers used  and Seldinger technique used Catheter size: 8 Fr Central line was placed.MAC introducer Procedure performed using ultrasound guided technique. Ultrasound Notes:anatomy identified, needle tip was noted to be adjacent to the nerve/plexus identified, no ultrasound evidence of intravascular and/or intraneural injection and image(s) printed for medical record Attempts: 1 Following insertion, dressing applied, line sutured and Biopatch. Post procedure assessment: blood return through all ports  Patient tolerated the procedure well with no immediate complications.

## 2024-05-08 NOTE — Anesthesia Procedure Notes (Addendum)
 Lumbar Drain  Patient location during procedure: pre-op Start time: 05/08/2024 10:20 AM End time: 05/08/2024 10:30 AM Staffing Performed: anesthesiologist  Anesthesiologist: Epifanio Charleston, MD Performed by: Epifanio Charleston, MD Authorized by: Epifanio Charleston, MD   Preanesthetic Checklist Completed: patient identified, IV checked, site marked, risks and benefits discussed, surgical consent, monitors and equipment checked, pre-op evaluation and timeout performed Lumbar Puncture:  Patient position: sitting Prep: DuraPrep Patient monitoring: heart rate, cardiac monitor, continuous pulse ox and blood pressure Approach: right paramedian Location: L3-4 Injection technique: catheter Needle Needle type: Tuohy  Needle gauge: 14 G Needle length: 9 cm Catheter type: closed end flexible Catheter size: 19 g Catheter at skin depth: 15 cm Assessment Events: cerebrospinal fluid, paresthesia and Brief paresthesia with aspiration. Needle withdrawn slightly and free flowing CSF and resolution paresthesia. Catheter advanced. Needle then wire removed and secured.. Attempts: 3 or more CSF: clear Post Procedure: sterile dressing applied

## 2024-05-08 NOTE — Op Note (Signed)
    Patient name: Tharon Bomar Helbig MRN: [id number] DOB: 09-11-1945 Sex: male  05/08/2024 Pre-operative Diagnosis: Thoracoabdominal aneurysm Post-operative diagnosis:  Same Surgeon:  Malvina New Co-surgeon:  Penne Cain,MD Procedure:   #1: [id number] (unlisted code, miscellaneous vascular)                       #2: 66118 (TEVAR, without coverage of left subclavian artery)                       #3: 65151 (abdominal aorta with four-vessel stenting and cannulation)                       #4: 65294 (bifurcated abdominal aortic aneurysm repair)                       #5: 65187 (cutdown with open exposure of left axillary artery)                       #6: 65286 (ultrasound-guided bilateral common femoral artery access                       #7: Selective stenting of the celiac artery, SMA, right renal artery, left renal artery                       #8: [id number] (new technology add-on payment)                       #9: 24043 (Flouro)   #10  IVUS  Anesthesia: General Blood Loss: 1 L Specimens: None  Findings: Complete exclusion  Indications:  78 year old male with history of 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  He has now been followed with known aneurysmal degeneration that now measures approximately 6 cm.  We have discussed proceeding with endovascular repair including the risks and benefits particularly the risk of spinal cord ischemia, stroke, lower extremity ischemia and he and his family demonstrate good understanding and consent has been signed.   A co-surgeon was necessary due to the multiple access points of this case including the bilateral common femoral arteries and left axillary artery in order to manipulate wires, catheters and stents expeditiously.  Procedure:  The patient was identified in the holding area and taken to [facility] OR ROOM 16  The patient was then placed supine on the table.  general anesthesia was administered.  The patient was prepped and draped in the usual sterile fashion.  A time out was called and antibiotics were administered.  Please see Dr Claretta note for full details.  I performed bilateral ultrasound-guided common femoral artery access.  I helped load the through and through 1 8 wires into the TAMBE graft.  I deployed the infrarenal graft, and helped with cannulation and stenting of the visceral vessels   Disposition:  To PACU stable   V. Malvina New, M.D., [facility] [place] Office: [phone] Pager:  [phone]

## 2024-05-09 DIAGNOSIS — Z95828 Presence of other vascular implants and grafts: Secondary | ICD-10-CM

## 2024-05-09 LAB — COMPREHENSIVE METABOLIC PANEL WITH GFR
ALT: 11 U/L (ref 0–44)
AST: 19 U/L (ref 15–41)
Albumin: 2.9 g/dL — ABNORMAL LOW (ref 3.5–5.0)
Alkaline Phosphatase: 52 U/L (ref 38–126)
Anion gap: 13 (ref 5–15)
BUN: 19 mg/dL (ref 8–23)
CO2: 21 mmol/L — ABNORMAL LOW (ref 22–32)
Calcium: 8 mg/dL — ABNORMAL LOW (ref 8.9–10.3)
Chloride: 106 mmol/L (ref 98–111)
Creatinine, Ser: 1.54 mg/dL — ABNORMAL HIGH (ref 0.61–1.24)
GFR, Estimated: 46 mL/min — ABNORMAL LOW (ref >60)
Glucose, Bld: 180 mg/dL — ABNORMAL HIGH (ref 70–99)
Potassium: 3.8 mmol/L (ref 3.5–5.1)
Sodium: 140 mmol/L (ref 135–145)
Total Bilirubin: 0.7 mg/dL (ref 0.0–1.2)
Total Protein: 5.1 g/dL — ABNORMAL LOW (ref 6.5–8.1)

## 2024-05-09 LAB — GLUCOSE, CAPILLARY
Glucose-Capillary: 178 mg/dL — ABNORMAL HIGH (ref 70–99)
Glucose-Capillary: 163 mg/dL — ABNORMAL HIGH (ref 70–99)
Glucose-Capillary: 130 mg/dL — ABNORMAL HIGH (ref 70–99)
Glucose-Capillary: 110 mg/dL — ABNORMAL HIGH (ref 70–99)

## 2024-05-09 LAB — CBC
HCT: 26.1 % — ABNORMAL LOW (ref 39.0–52.0)
HCT: 27.3 % — ABNORMAL LOW (ref 39.0–52.0)
HCT: 27.5 % — ABNORMAL LOW (ref 39.0–52.0)
HCT: 28.4 % — ABNORMAL LOW (ref 39.0–52.0)
HCT: 30.7 % — ABNORMAL LOW (ref 39.0–52.0)
Hemoglobin: 10.4 g/dL — ABNORMAL LOW (ref 13.0–17.0)
Hemoglobin: 8.8 g/dL — ABNORMAL LOW (ref 13.0–17.0)
Hemoglobin: 9.1 g/dL — ABNORMAL LOW (ref 13.0–17.0)
Hemoglobin: 9.3 g/dL — ABNORMAL LOW (ref 13.0–17.0)
Hemoglobin: 9.5 g/dL — ABNORMAL LOW (ref 13.0–17.0)
MCH: 29 pg (ref 26.0–34.0)
MCH: 29.1 pg (ref 26.0–34.0)
MCH: 29.2 pg (ref 26.0–34.0)
MCH: 29.3 pg (ref 26.0–34.0)
MCH: 29.4 pg (ref 26.0–34.0)
MCHC: 33.3 g/dL (ref 30.0–36.0)
MCHC: 33.5 g/dL (ref 30.0–36.0)
MCHC: 33.7 g/dL (ref 30.0–36.0)
MCHC: 33.8 g/dL (ref 30.0–36.0)
MCHC: 33.9 g/dL (ref 30.0–36.0)
MCV: 85.8 fL (ref 80.0–100.0)
MCV: 86.6 fL (ref 80.0–100.0)
MCV: 87 fL (ref 80.0–100.0)
MCV: 87 fL (ref 80.0–100.0)
MCV: 87.5 fL (ref 80.0–100.0)
Platelets: 100 K/uL — ABNORMAL LOW (ref 150–400)
Platelets: 103 K/uL — ABNORMAL LOW (ref 150–400)
Platelets: 108 K/uL — ABNORMAL LOW (ref 150–400)
Platelets: 122 K/uL — ABNORMAL LOW (ref 150–400)
Platelets: 125 K/uL — ABNORMAL LOW (ref 150–400)
RBC: 3 MIL/uL — ABNORMAL LOW (ref 4.22–5.81)
RBC: 3.12 MIL/uL — ABNORMAL LOW (ref 4.22–5.81)
RBC: 3.16 MIL/uL — ABNORMAL LOW (ref 4.22–5.81)
RBC: 3.28 MIL/uL — ABNORMAL LOW (ref 4.22–5.81)
RBC: 3.58 MIL/uL — ABNORMAL LOW (ref 4.22–5.81)
RDW: 16.1 % — ABNORMAL HIGH (ref 11.5–15.5)
RDW: 16.1 % — ABNORMAL HIGH (ref 11.5–15.5)
RDW: 16.3 % — ABNORMAL HIGH (ref 11.5–15.5)
RDW: 16.3 % — ABNORMAL HIGH (ref 11.5–15.5)
RDW: 16.4 % — ABNORMAL HIGH (ref 11.5–15.5)
WBC: 10.2 K/uL (ref 4.0–10.5)
WBC: 10.7 K/uL — ABNORMAL HIGH (ref 4.0–10.5)
WBC: 11.8 K/uL — ABNORMAL HIGH (ref 4.0–10.5)
WBC: 12 K/uL — ABNORMAL HIGH (ref 4.0–10.5)
WBC: 12.9 K/uL — ABNORMAL HIGH (ref 4.0–10.5)
nRBC: 0 % (ref 0.0–0.2)
nRBC: 0 % (ref 0.0–0.2)
nRBC: 0 % (ref 0.0–0.2)
nRBC: 0 % (ref 0.0–0.2)
nRBC: 0 % (ref 0.0–0.2)

## 2024-05-09 LAB — PROTIME-INR
INR: 1.3 — ABNORMAL HIGH (ref 0.8–1.2)
Prothrombin Time: 16.5 s — ABNORMAL HIGH (ref 11.4–15.2)

## 2024-05-09 LAB — FIBRINOGEN
Fibrinogen: 215 mg/dL (ref 210–475)
Fibrinogen: 226 mg/dL (ref 210–475)
Fibrinogen: 232 mg/dL (ref 210–475)
Fibrinogen: 275 mg/dL (ref 210–475)

## 2024-05-09 LAB — APTT: aPTT: 31 s (ref 24–36)

## 2024-05-09 NOTE — Anesthesia Postprocedure Evaluation (Signed)
 Anesthesia Post Note  Patient: [name] [name]  Procedure(s) Performed: REPAIR, ANEURYSM, AORTA, THORACOABDOMINAL (Bilateral: Groin) ULTRASOUND GUIDANCE, FOR VASCULAR ACCESS (Bilateral: Groin) IVUS (INTRAVASCULAR ULTRASOUND) (Right: Groin)     Patient location during evaluation: PACU Anesthesia Type: General Level of consciousness: awake and alert Pain management: pain level controlled Vital Signs Assessment: post-procedure vital signs reviewed and stable Respiratory status: spontaneous breathing, nonlabored ventilation, respiratory function stable and patient connected to nasal cannula oxygen Cardiovascular status: blood pressure returned to baseline and stable Postop Assessment: no apparent nausea or vomiting Anesthetic complications: no   No notable events documented.  Last Vitals:  Vitals:   05/09/24 0930 05/09/24 1000  BP: 123/78 120/74  Pulse: 69 63  Resp: 20 17  Temp:    SpO2: 96% 95%    Last Pain:  Vitals:   05/09/24 0804  TempSrc: Oral  PainSc:                  Midori Dado E

## 2024-05-09 NOTE — Progress Notes (Signed)
 Progress Note    05/09/2024 8:31 AM 1 Day Post-Op  Subjective: No overnight issues  Vitals:   05/09/24 0730 05/09/24 0804  BP: 118/70   Pulse: 66   Resp: (!) 24   Temp:  98.3 F (36.8 C)  SpO2: 96%     Physical Exam: Awake alert and oriented Moving all extremities and they are sensorimotor intact Left infraclavicular incision healing well with mild hematoma Bilateral groins without hematoma Bilateral dorsalis pedis pulses are palpable  CBC    Component Value Date/Time   WBC 10.7 (H) 05/09/2024 0525   RBC 3.28 (L) 05/09/2024 0525   HGB 9.5 (L) 05/09/2024 0525   HGB 12.7 (L) 11/14/2020 1031   HCT 28.4 (L) 05/09/2024 0525   HCT 39.1 11/14/2020 1031   PLT 125 (L) 05/09/2024 0525   PLT 157 11/14/2020 1031   MCV 86.6 05/09/2024 0525   MCV 90 11/14/2020 1031   MCH 29.0 05/09/2024 0525   MCHC 33.5 05/09/2024 0525   RDW 16.4 (H) 05/09/2024 0525   RDW 15.6 (H) 11/14/2020 1031   LYMPHSABS 1.0 03/04/2024 0446   LYMPHSABS 1.0 11/14/2020 1031   MONOABS 1.0 03/04/2024 0446   EOSABS 0.2 03/04/2024 0446   EOSABS 0.3 11/14/2020 1031   BASOSABS 0.0 03/04/2024 0446   BASOSABS 0.1 11/14/2020 1031    BMET    Component Value Date/Time   NA 140 05/09/2024 0525   NA 145 (H) 11/14/2020 1031   K 3.8 05/09/2024 0525   CL 106 05/09/2024 0525   CO2 21 (L) 05/09/2024 0525   GLUCOSE 180 (H) 05/09/2024 0525   BUN 19 05/09/2024 0525   BUN 12 11/14/2020 1031   CREATININE 1.54 (H) 05/09/2024 0525   CALCIUM  8.0 (L) 05/09/2024 0525   GFRNONAA 46 (L) 05/09/2024 0525   GFRAA >60 05/09/2020 1228    INR    Component Value Date/Time   INR 1.3 (H) 05/09/2024 0550     Intake/Output Summary (Last 24 hours) at 05/09/2024 0831 Last data filed at 05/09/2024 0600 Gross per 24 hour  Intake 4693.17 ml  Output 2000 ml  Net 2693.17 ml     Assessment:  78 y.o. male is s/p:  Procedure:   #1: 37799 (unlisted code, miscellaneous vascular)                       #2: [phone] (TEVAR, without  coverage of left subclavian artery)                       #3: 65151 (abdominal aorta with four-vessel stenting and cannulation)                       #4: 65294 (bifurcated abdominal aortic aneurysm repair)                       #5: 65187 (cutdown with open exposure of left axillary artery)                       #6: 65286 (ultrasound-guided bilateral common femoral artery access                       #7: Selective stenting of the celiac artery, SMA, right renal artery, left renal artery                       #8: [id number] (new  technology add-on payment)                       #9: 24043 (Flouro)                       #10  IVUS 1 Day Post-Op  Plan: Progressing well in the ICU.  Will leave Foley catheter due to elevated creatinine and remain on IV fluids.  Will cap spinal drain as pressures have been low.   [name] [name] C. Sheree, MD [facility] [place] Office: [phone] Pager: [phone]  05/09/2024 8:31 AM

## 2024-05-09 NOTE — Anesthesia Post-op Follow-up Note (Signed)
  Anesthesia Pain Follow-up Note  Patient: [name] [name]  Day #: 1  Date of Follow-up: 05/09/2024 Time: 6:06 PM  Last Vitals:  Vitals:   05/09/24 1615 05/09/24 1700  BP:  (!) 157/80  Pulse:  65  Resp:  (!) 29  Temp: (!) 38.1 C   SpO2:  90%    Level of Consciousness: alert  Pain: none   Side Effects:None  Catheter Site Exam:clean, dry  Anti-Coag Meds (From admission, onward)    Start     Dose/Rate Route Frequency Ordered Stop   05/09/24 1000  heparin  injection 5,000 Units        5,000 Units Subcutaneous Every 12 hours 05/08/24 1856          Plan: Lumbar drain capped this am at surgeon request due to hypotension. Site c/d/I without tenderness. CSF appearance WNL. Motor/sensor function b/l LE WNL.   Cordella P Amando Ishikawa

## 2024-05-10 LAB — APTT: aPTT: 34 s (ref 24–36)

## 2024-05-10 LAB — GLUCOSE, CAPILLARY
Glucose-Capillary: 136 mg/dL — ABNORMAL HIGH (ref 70–99)
Glucose-Capillary: 155 mg/dL — ABNORMAL HIGH (ref 70–99)
Glucose-Capillary: 160 mg/dL — ABNORMAL HIGH (ref 70–99)
Glucose-Capillary: 136 mg/dL — ABNORMAL HIGH (ref 70–99)

## 2024-05-10 LAB — COMPREHENSIVE METABOLIC PANEL WITH GFR
ALT: 15 U/L (ref 0–44)
AST: 34 U/L (ref 15–41)
Albumin: 2.8 g/dL — ABNORMAL LOW (ref 3.5–5.0)
Alkaline Phosphatase: 48 U/L (ref 38–126)
Anion gap: 7 (ref 5–15)
BUN: 16 mg/dL (ref 8–23)
CO2: 23 mmol/L (ref 22–32)
Calcium: 8.1 mg/dL — ABNORMAL LOW (ref 8.9–10.3)
Chloride: 108 mmol/L (ref 98–111)
Creatinine, Ser: 1.27 mg/dL — ABNORMAL HIGH (ref 0.61–1.24)
GFR, Estimated: 57 mL/min — ABNORMAL LOW (ref >60)
Glucose, Bld: 127 mg/dL — ABNORMAL HIGH (ref 70–99)
Potassium: 3.6 mmol/L (ref 3.5–5.1)
Sodium: 138 mmol/L (ref 135–145)
Total Bilirubin: 0.9 mg/dL (ref 0.0–1.2)
Total Protein: 5.1 g/dL — ABNORMAL LOW (ref 6.5–8.1)

## 2024-05-10 LAB — PROTIME-INR
INR: 1.3 — ABNORMAL HIGH (ref 0.8–1.2)
Prothrombin Time: 17 s — ABNORMAL HIGH (ref 11.4–15.2)

## 2024-05-10 LAB — FIBRINOGEN
Fibrinogen: 276 mg/dL (ref 210–475)
Fibrinogen: 349 mg/dL (ref 210–475)

## 2024-05-10 LAB — CBC
HCT: 27.3 % — ABNORMAL LOW (ref 39.0–52.0)
Hemoglobin: 9.1 g/dL — ABNORMAL LOW (ref 13.0–17.0)
MCH: 29.2 pg (ref 26.0–34.0)
MCHC: 33.3 g/dL (ref 30.0–36.0)
MCV: 87.5 fL (ref 80.0–100.0)
Platelets: 93 K/uL — ABNORMAL LOW (ref 150–400)
RBC: 3.12 MIL/uL — ABNORMAL LOW (ref 4.22–5.81)
RDW: 16.1 % — ABNORMAL HIGH (ref 11.5–15.5)
WBC: 12.1 K/uL — ABNORMAL HIGH (ref 4.0–10.5)
nRBC: 0 % (ref 0.0–0.2)

## 2024-05-10 NOTE — Progress Notes (Signed)
 Following up on the lumbar drain that was placed for surgery on 9/19.  Drain is currently clamped with no fluid being drained.  Sterile dressing is c/d/I.  Anticipate removal tomorrow as per surgeon's note.

## 2024-05-10 NOTE — Progress Notes (Addendum)
 Progress Note    05/10/2024 9:50 AM 2 Days Post-Op  Subjective:  says everything feels normal; RN reports Tm of 102 and 99 after Tylenol   Tm 102 now 99.1 (99.9 at 0200 this morning)  Vitals:   05/10/24 0800 05/10/24 0900  BP: (!) 170/85 (!) 149/86  Pulse: 77 90  Resp: (!) 29 (!) 26  Temp:  99.1 F (37.3 C)  SpO2: 95% 96%    Physical Exam: General:  no distress Cardiac:  regular Lungs:  non labored Incisions:  bilateral groins look good Extremities:  palpable radial and DP pulses bilaterally; normal sensation; motor intact.    CBC    Component Value Date/Time   WBC 12.1 (H) 05/10/2024 0419   RBC 3.12 (L) 05/10/2024 0419   HGB 9.1 (L) 05/10/2024 0419   HGB 12.7 (L) 11/14/2020 1031   HCT 27.3 (L) 05/10/2024 0419   HCT 39.1 11/14/2020 1031   PLT 93 (L) 05/10/2024 0419   PLT 157 11/14/2020 1031   MCV 87.5 05/10/2024 0419   MCV 90 11/14/2020 1031   MCH 29.2 05/10/2024 0419   MCHC 33.3 05/10/2024 0419   RDW 16.1 (H) 05/10/2024 0419   RDW 15.6 (H) 11/14/2020 1031   LYMPHSABS 1.0 03/04/2024 0446   LYMPHSABS 1.0 11/14/2020 1031   MONOABS 1.0 03/04/2024 0446   EOSABS 0.2 03/04/2024 0446   EOSABS 0.3 11/14/2020 1031   BASOSABS 0.0 03/04/2024 0446   BASOSABS 0.1 11/14/2020 1031    BMET    Component Value Date/Time   NA 138 05/10/2024 0419   NA 145 (H) 11/14/2020 1031   K 3.6 05/10/2024 0419   CL 108 05/10/2024 0419   CO2 23 05/10/2024 0419   GLUCOSE 127 (H) 05/10/2024 0419   BUN 16 05/10/2024 0419   BUN 12 11/14/2020 1031   CREATININE 1.27 (H) 05/10/2024 0419   CALCIUM  8.1 (L) 05/10/2024 9580   GFRNONAA 57 (L) 05/10/2024 0419   GFRAA >60 05/09/2020 1228    INR    Component Value Date/Time   INR 1.3 (H) 05/10/2024 0419     Intake/Output Summary (Last 24 hours) at 05/10/2024 0950 Last data filed at 05/10/2024 0800 Gross per 24 hour  Intake 957.27 ml  Output 2200 ml  Net -1242.73 ml      Assessment/Plan:  78 y.o. male is s/p:  1.  Thoracic  aortic aneurysm repair with Gore conformable CTAG 40 x 40 x 10 cm 2.  Endovascular repair of juxtarenal aortic aneurysm with main body right 37 x 20 x 160 cm Gore excluder thoracoabdominal branched endoprosthesis extended with 23 x 14 x 16 cm Gore excluder and contralateral left limb 16 x 13.5 cm 3.  Stent of celiac artery with 9 x 59 mm VBX, stent of SMA with 9 x 79 mm VBX, stent left renal artery with two 7 x 79 mm VBX, stent of right renal artery with 7 x 59 and 7 x 79 mm VBX 4.  Open exposure left axillary artery 5.  Percutaneous ultrasound-guided cannulation and proglide closure bilateral common femoral arteries 6.  Intravascular ultrasound of aorta  2 Days Post-Op   -pt with palpable distal pulses.  Subjectively feels normal -leukocytosis stable-Tm 102 and 99.1 after Tylenol .  Groins look fine.  Mobilize out of bed and get IS.  Foley left in yesterday due to drain-probably ok to remove.  Will defer to Dr. Sheree.  -creatinine improved to 1.27 from 1.54 -thrombocytopenia-platelet count 93k down from 125k yesterday morning. (125k>103k>103k>100k>93K)  153k  pre-op.  ?HIT.  Heparin  will be stopped for drain removal tomorrow.  Continue to monitor.   -has done well with capping the lumbar drain.  Ok for out of bed -most likely for lumbar drain removal tomorrow.  Will hold heparin  tonight and in the morning. -remove a-line and central line as long as there is adequate IV access.   -DVT prophylaxis:  sq heparin  as above.     Lucie Apt, PA-C [facility] [phone] 05/10/2024 9:50 AM   I have interviewed and examined patient with PA and agree with assessment and plan above. Progressing well.  Lumbar drain And out of bed today with plans for removal tomorrow if he remains at neurologic baseline.  Liela Rylee C. Sheree, MD [facility] [place] Office: [phone] Pager: [phone]

## 2024-05-11 ENCOUNTER — Encounter (HOSPITAL_COMMUNITY): Payer: Self-pay | Admitting: Anesthesiology

## 2024-05-11 ENCOUNTER — Encounter (HOSPITAL_COMMUNITY): Payer: Self-pay | Admitting: Vascular Surgery

## 2024-05-11 LAB — CBC
HCT: 25.7 % — ABNORMAL LOW (ref 39.0–52.0)
HCT: 28 % — ABNORMAL LOW (ref 39.0–52.0)
Hemoglobin: 8.5 g/dL — ABNORMAL LOW (ref 13.0–17.0)
Hemoglobin: 9.2 g/dL — ABNORMAL LOW (ref 13.0–17.0)
MCH: 29 pg (ref 26.0–34.0)
MCH: 29.2 pg (ref 26.0–34.0)
MCHC: 33.1 g/dL (ref 30.0–36.0)
MCHC: 32.9 g/dL (ref 30.0–36.0)
MCV: 87.7 fL (ref 80.0–100.0)
MCV: 88.9 fL (ref 80.0–100.0)
Platelets: 95 K/uL — ABNORMAL LOW (ref 150–400)
Platelets: 106 K/uL — ABNORMAL LOW (ref 150–400)
RBC: 2.93 MIL/uL — ABNORMAL LOW (ref 4.22–5.81)
RBC: 3.15 MIL/uL — ABNORMAL LOW (ref 4.22–5.81)
RDW: 15.7 % — ABNORMAL HIGH (ref 11.5–15.5)
RDW: 15.8 % — ABNORMAL HIGH (ref 11.5–15.5)
WBC: 11.4 K/uL — ABNORMAL HIGH (ref 4.0–10.5)
WBC: 12.4 K/uL — ABNORMAL HIGH (ref 4.0–10.5)
nRBC: 0 % (ref 0.0–0.2)
nRBC: 0 % (ref 0.0–0.2)

## 2024-05-11 LAB — GLUCOSE, CAPILLARY
Glucose-Capillary: 143 mg/dL — ABNORMAL HIGH (ref 70–99)
Glucose-Capillary: 144 mg/dL — ABNORMAL HIGH (ref 70–99)
Glucose-Capillary: 116 mg/dL — ABNORMAL HIGH (ref 70–99)
Glucose-Capillary: 136 mg/dL — ABNORMAL HIGH (ref 70–99)

## 2024-05-11 LAB — COMPREHENSIVE METABOLIC PANEL WITH GFR
ALT: 12 U/L (ref 0–44)
AST: 21 U/L (ref 15–41)
Albumin: 2.7 g/dL — ABNORMAL LOW (ref 3.5–5.0)
Alkaline Phosphatase: 48 U/L (ref 38–126)
Anion gap: 9 (ref 5–15)
BUN: 15 mg/dL (ref 8–23)
CO2: 22 mmol/L (ref 22–32)
Calcium: 8.1 mg/dL — ABNORMAL LOW (ref 8.9–10.3)
Chloride: 107 mmol/L (ref 98–111)
Creatinine, Ser: 1.49 mg/dL — ABNORMAL HIGH (ref 0.61–1.24)
GFR, Estimated: 47 mL/min — ABNORMAL LOW (ref >60)
Glucose, Bld: 151 mg/dL — ABNORMAL HIGH (ref 70–99)
Potassium: 3.7 mmol/L (ref 3.5–5.1)
Sodium: 138 mmol/L (ref 135–145)
Total Bilirubin: 1.2 mg/dL (ref 0.0–1.2)
Total Protein: 5.5 g/dL — ABNORMAL LOW (ref 6.5–8.1)

## 2024-05-11 LAB — PROTIME-INR
INR: 1.2 (ref 0.8–1.2)
Prothrombin Time: 16.2 s — ABNORMAL HIGH (ref 11.4–15.2)

## 2024-05-11 MED ORDER — ORAL CARE MOUTH RINSE: 15.0000 mL | OROMUCOSAL | Status: DC | PRN

## 2024-05-11 MED ORDER — POTASSIUM CHLORIDE CRYS ER 20 MEQ PO TBCR
40.0000 meq | EXTENDED_RELEASE_TABLET | Freq: Once | ORAL | Status: AC
  Administered 2024-05-11: 40 meq via ORAL
  Filled 2024-05-11: qty 2

## 2024-05-11 NOTE — Progress Notes (Addendum)
 Anesthesia Note: Pt repeat labs plts 106, PT 1.2. Spinal drain removed. Tip intact. May resume heparin  SQ.

## 2024-05-11 NOTE — TOC Initial Note (Signed)
 Transition of Care [facility]) - Initial/Assessment Note    Patient Details  Name: [name] [name] MRN: [id number] Date of Birth: 1945-12-02  Transition of Care [facility]) CM/SW Contact:    Sudie Erminio Deems, RN Phone Number: 05/11/2024, 3:47 PM  Clinical Narrative:  Patient s/p thoracic aortic aneurysm repair. PTA patient was from home with spouse. Patient is currently active with [facility] for RN. Patient has DME rolling walker and rollator in the home. Spouse has questions regarding DME hospital bed. Spouse states patient will benefit from hospital bed once stable for discharge because they have 16 steps to get upstairs to the bedroom. Inpatient Case Manager did make the spouse aware that if the plan is for CIR and insurance approves that CIR will need to order once he is discharged from that location. ICM will continue to follow for additional needs.                 Expected Discharge Plan: IP Rehab Facility Barriers to Discharge: Continued Medical Work up   Patient Goals and CMS Choice     Choice offered to / list presented to : NA      Expected Discharge Plan and Services       Living arrangements for the past 2 months: Single Family Home                   DME Agency: NA       HH Arranged: PT HH Agency: [facility] Date HH Agency Contacted: 05/11/24 Time HH Agency Contacted: 1545 Representative spoke with at [facility] Agency: [place]  Prior Living Arrangements/Services Living arrangements for the past 2 months: Single Family Home Lives with:: Spouse Patient language and need for interpreter reviewed:: Yes Do you feel safe going back to the place where you live?: Yes      Need for Family Participation in Patient Care: Yes (Comment) Care giver support system in place?: Yes (comment) Current home services: DME (rolling walker and rollator.) Criminal Activity/Legal Involvement Pertinent to Current Situation/Hospitalization: No - Comment as  needed  Activities of Daily Living   ADL Screening (condition at time of admission) Independently performs ADLs?: Yes (appropriate for developmental age) Is the patient deaf or have difficulty hearing?: No Does the patient have difficulty seeing, even when wearing glasses/contacts?: No Does the patient have difficulty concentrating, remembering, or making decisions?: No  Permission Sought/Granted Permission sought to share information with : Family Supports, Case Manager                Emotional Assessment Appearance:: Appears stated age Attitude/Demeanor/Rapport: Engaged Affect (typically observed): Appropriate Orientation: : Oriented to Self, Oriented to Place Alcohol / Substance Use: Not Applicable Psych Involvement: No (comment)  Admission diagnosis:  Dissecting aortic aneurysm, 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 [S01.109, Z86.79] 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] Patient Active Problem List   Diagnosis Date Noted   Status post abdominal aortic aneurysm (AAA) repair 05/08/2024   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   Acute encephalopathy 03/01/2024   Allergic rhinitis 09/21/2020   Benign prostatic hyperplasia 09/21/2020   Body mass index (BMI) 31.0-31.9, adult 09/21/2020   Cerebral infarction (HCC) 09/21/2020   Diabetic foot (HCC) 09/21/2020   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   Dysarthria 09/21/2020   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   Gait difficulty 09/21/2020   History of psychiatric disorder 09/21/2020   History of colonic polyps 09/21/2020   Pure hypercholesterolemia 09/21/2020   Unspecified thoracic, thoracolumbar  and lumbosacral intervertebral disc disorder 09/21/2020   Hypertension    Hyperlipidemia    GERD (gastroesophageal reflux disease)    DM (diabetes mellitus) (HCC)    CKD (chronic kidney disease), stage III (HCC)    Normocytic anemia    Thrombocytopenia    UTI  (urinary tract infection)    AMS (altered mental status)    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   History of repair of dissecting thoracic aneurysm    Abdominal distension    Pulmonary embolus (HCC) 10/19/2019   CVA (cerebral vascular accident) (HCC) 10/19/2019   AKI (acute kidney injury) (HCC) 10/19/2019   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 [place] [address] Phone: [phone] Fax: [phone]     Social Drivers of Health (SDOH) Social History: SDOH Screenings   Food Insecurity: No Food Insecurity (05/08/2024)  Housing: Low Risk  (05/08/2024)  Transportation Needs: No Transportation Needs (05/08/2024)  Utilities: Not At Risk (05/08/2024)  Depression (PHQ2-9): Medium Risk (11/14/2020)  Social Connections: Moderately Integrated (05/08/2024)  Tobacco Use: Medium Risk (05/08/2024)   SDOH Interventions:     Readmission Risk Interventions     No data to display

## 2024-05-11 NOTE — Addendum Note (Signed)
 Addendum  created 05/11/24 1132 by Darlyn Rush, MD   Clinical Note Signed

## 2024-05-11 NOTE — Progress Notes (Addendum)
 Progress Note    05/11/2024 6:42 AM 3 Days Post-Op  Subjective:  sitting up in bed eating breakfast.  When asked, he states everything feels normal.  He got out of bed yesterday.  He is doing well with the foley out.  Denies any burning.  Denies abdominal pain.    Tm 100.7 now 98.9 HR 60's-90's NSR 140's-160's systolic 91-97% RA  Vitals:   90/77/74 0500 05/11/24 0600  BP: (!) 149/82 (!) 160/78  Pulse: 69 77  Resp: (!) 29 (!) 30  Temp:    SpO2: 93% 97%    Physical Exam: General:  no distress Cardiac:  regular Lungs:  non labored Incisions:  left chest incision is clean and dry.  Bilateral groins soft and clean. Extremities:  palpable bilateral radial and DP pulses.   Abdomen:  soft.  CBC    Component Value Date/Time   WBC 11.4 (H) 05/11/2024 0235   RBC 2.93 (L) 05/11/2024 0235   HGB 8.5 (L) 05/11/2024 0235   HGB 12.7 (L) 11/14/2020 1031   HCT 25.7 (L) 05/11/2024 0235   HCT 39.1 11/14/2020 1031   PLT 95 (L) 05/11/2024 0235   PLT 157 11/14/2020 1031   MCV 87.7 05/11/2024 0235   MCV 90 11/14/2020 1031   MCH 29.0 05/11/2024 0235   MCHC 33.1 05/11/2024 0235   RDW 15.7 (H) 05/11/2024 0235   RDW 15.6 (H) 11/14/2020 1031   LYMPHSABS 1.0 03/04/2024 0446   LYMPHSABS 1.0 11/14/2020 1031   MONOABS 1.0 03/04/2024 0446   EOSABS 0.2 03/04/2024 0446   EOSABS 0.3 11/14/2020 1031   BASOSABS 0.0 03/04/2024 0446   BASOSABS 0.1 11/14/2020 1031    BMET    Component Value Date/Time   NA 138 05/11/2024 0235   NA 145 (H) 11/14/2020 1031   K 3.7 05/11/2024 0235   CL 107 05/11/2024 0235   CO2 22 05/11/2024 0235   GLUCOSE 151 (H) 05/11/2024 0235   BUN 15 05/11/2024 0235   BUN 12 11/14/2020 1031   CREATININE 1.49 (H) 05/11/2024 0235   CALCIUM  8.1 (L) 05/11/2024 0235   GFRNONAA 47 (L) 05/11/2024 0235   GFRAA >60 05/09/2020 1228    INR    Component Value Date/Time   INR 1.3 (H) 05/10/2024 0419     Intake/Output Summary (Last 24 hours) at 05/11/2024 0642 Last data  filed at 05/11/2024 0024 Gross per 24 hour  Intake 600 ml  Output 725 ml  Net -125 ml      Assessment/Plan:  78 y.o. male is s/p:  1.  Thoracic aortic aneurysm repair with Gore conformable CTAG 40 x 40 x 10 cm 2.  Endovascular repair of juxtarenal aortic aneurysm with main body right 37 x 20 x 160 cm Gore excluder thoracoabdominal branched endoprosthesis extended with 23 x 14 x 16 cm Gore excluder and contralateral left limb 16 x 13.5 cm 3.  Stent of celiac artery with 9 x 59 mm VBX, stent of SMA with 9 x 79 mm VBX, stent left renal artery with two 7 x 79 mm VBX, stent of right renal artery with 7 x 59 and 7 x 79 mm VBX 4.  Open exposure left axillary artery 5.  Percutaneous ultrasound-guided cannulation and proglide closure bilateral common femoral arteries 6.  Intravascular ultrasound of aorta  3 Days Post-Op   -pt doing well this morning and continues to be neurological intact. Discontinue lumbar drain today.  Will need to restart sq heparin  back when ok with anesthesia.  May  need to continue to hold to see if platelets continue to improve.  -leukocytosis slightly improved 11.4.  Tm yesterday 102.  Improved to Tm 100.7 overnight and now afebrile.  Denies any dysuria after foley removal.  Continue IS and mobilize. -thrombocytopenia-platelets stable at 95k  -creatinine 1.49 up from yesterday but still stable from POD 1. Check labs tomorrow morning.   -DVT prophylaxis:  sq heparin  on hold for drain removal.  SCD's.     Lucie Apt, PA-C [facility] [phone] 05/11/2024 6:42 AM  I have independently interviewed and examined patient and agree with PA assessment and plan above.  Will plan for drain removal at anesthesia discretion and restart subcutaneous heparin .  Overall he appears to be progressing well other than mild fever.  Keyon Winnick C. Sheree, MD [facility] [place] Office: [phone] Pager: [phone]

## 2024-05-11 NOTE — Evaluation (Signed)
 Physical Therapy Evaluation Patient Details Name: [name] [name] MRN: [id number] DOB: 1945-11-03 Today's Date: 05/11/2024  History of Present Illness  78 y.o. male admitted  9/19 s/p Thoracic aortic aneurysm repair; Open exposure left axillary artery. 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   Clinical Impression  Patient is s/p above surgery presenting with functional limitations due to the deficits listed below (see PT Problem List). Disoriented to month but otherwise oriented to place, situation, self, and year. He required up to mod assist +2 to stand with RW today, 3x. Lt knee buckling and going into hyperextension with poor eccentric control of hamstring. Shows significant weakness grossly but noted LLE and LUE weaker than right with LLE being more involved. MMT shows 4-/5 Lt quad, 3+/5 Lt hamstring. Rt quad 5/5, Rt hamstring 4/5. LUE weakness more proximal than distal with reduced coordination in Lt hand. No sensory changes. Self reported hx of skew which is present. Pt denies hx of Lt side weakness but has hx of CVA and dementia in chart. Alerted RN, and states this weakness was reported at handoff, noted post-op. Patient will benefit from intensive inpatient follow-up therapy, >3 hours/day. Patient will benefit from acute skilled PT to increase their independence and safety with mobility to facilitate discharge.         If plan is discharge home, recommend the following: Two people to help with walking and/or transfers;Two people to help with bathing/dressing/bathroom;Assistance with cooking/housework;Direct supervision/assist for medications management;Direct supervision/assist for financial management;Assist for transportation;Help with stairs or ramp for entrance;Supervision due to cognitive status   Can travel by private vehicle         Equipment Recommendations None recommended by PT  Recommendations for Other Services  Rehab consult;OT consult    Functional Status Assessment Patient has had a recent decline in their functional status and demonstrates the ability to make significant improvements in function in a reasonable and predictable amount of time.     Precautions / Restrictions Precautions Precautions: Fall;Other (comment) Recall of Precautions/Restrictions: Impaired Precaution/Restrictions Comments: open exposure left axillary artery. lumbar drain Restrictions Weight Bearing Restrictions Per Provider Order: No      Mobility  Bed Mobility               General bed mobility comments: In recliner    Transfers Overall transfer level: Needs assistance Equipment used: Rolling walker (2 wheels) Transfers: Sit to/from Stand Sit to Stand: Mod assist, +2 physical assistance           General transfer comment: Mod assist +2 for boost and balance. Needs assist to scoot to edge of recliner. Cues for anterior weight shift and hand placement. Leans posteriorly when rising. Difficulty standing fully upright. Progressed to mod assist +1 on 3rd trial.    Ambulation/Gait             Pre-gait activities: Progressed with forward and retrostep. Poor quad control noted Lt , resulting in buckling and hyperextension unless blocked by therapist. He was able to march in place with heavy UE reliance on RW, and bracing on Lt side from therapist.    Stairs            Wheelchair Mobility     Tilt Bed    Modified Rankin (Stroke Patients Only)       Balance Overall balance assessment: Needs assistance Sitting-balance support: Single extremity supported, Feet supported Sitting  balance-Leahy Scale: Poor     Standing balance support: Bilateral upper extremity supported Standing balance-Leahy Scale: Poor                               Pertinent Vitals/Pain Pain Assessment Pain  Assessment: Faces Faces Pain Scale: Hurts little more Pain Descriptors / Indicators: Aching Pain Intervention(s): Limited activity within patient's tolerance, Monitored during session, Repositioned    Home Living Family/patient expects to be discharged to:: Private residence Living Arrangements: Spouse/significant other Available Help at Discharge: Family;Available 24 hours/day Type of Home: House Home Access: Stairs to enter Entrance Stairs-Rails: None Entrance Stairs-Number of Steps: 1 Alternate Level Stairs-Number of Steps: 7+7 with landing Home Layout: Two level;Bed/bath upstairs Home Equipment: Tub bench;Rolling Walker (2 wheels);Rollator (4 wheels);Hand held shower head;BSC/3in1;Grab bars - toilet;Wheelchair - manual      Prior Function Prior Level of Function : Independent/Modified Independent             Mobility Comments: rollator to ambulate ADLs Comments: ind, plays piano     Extremity/Trunk Assessment   Upper Extremity Assessment Upper Extremity Assessment: Defer to OT evaluation;Right hand dominant    Lower Extremity Assessment Lower Extremity Assessment: Generalized weakness;LLE deficits/detail LLE Deficits / Details: Knee extension 4-/5, knee flexion 3+/5, ankle DF 4/5. No clonus noted with ankle jerk. Reduced coordination with LUE fingers       Communication   Communication Communication: No apparent difficulties    Cognition Arousal: Alert Behavior During Therapy: WFL for tasks assessed/performed   PT - Cognitive impairments: No family/caregiver present to determine baseline, Orientation, Awareness, Memory, Sequencing, Problem solving   Orientation impairments: Time                   PT - Cognition Comments: Thought it was May, currently september. Gave correct year though. delayed processing Following commands: Impaired Following commands impaired: Only follows one step commands consistently, Follows one step commands with increased  time     Cueing Cueing Techniques: Verbal cues, Gestural cues     General Comments General comments (skin integrity, edema, etc.): Pt noted to have mild Lt hemiparesis LLE>LUE. hx of CVA but alerted RN due to pt reporting being independent PTA and now having significant functional decline. No altered sensation. Skew + but reports this is baseline. RN reports Lt hemi was already reported at shift change/hand off.    Exercises General Exercises - Lower Extremity Ankle Circles/Pumps: AROM, Both, 10 reps, Seated Quad Sets: Strengthening, Both, 10 reps, Seated Gluteal Sets: Strengthening, Both, 10 reps, Seated Long Arc Quad: Strengthening, Both, 10 reps, Seated   Assessment/Plan    PT Assessment Patient needs continued PT services  PT Problem List Decreased strength;Decreased range of motion;Decreased activity tolerance;Decreased balance;Decreased mobility;Decreased coordination;Decreased cognition;Decreased knowledge of use of DME;Decreased safety awareness;Decreased knowledge of precautions;Pain       PT Treatment Interventions DME instruction;Gait training;Stair training;Functional mobility training;Therapeutic activities;Therapeutic exercise;Balance training;Neuromuscular re-education;Cognitive remediation;Patient/family education;Wheelchair mobility training    PT Goals (Current goals can be found in the Care Plan section)  Acute Rehab PT Goals Patient Stated Goal: Get well, go home PT Goal Formulation: With patient Time For Goal Achievement: 05/25/24 Potential to Achieve Goals: Good    Frequency Min 2X/week     Co-evaluation               AM-PAC PT 6 Clicks Mobility  Outcome Measure Help needed turning from your back to your side while  in a flat bed without using bedrails?: A Little Help needed moving from lying on your back to sitting on the side of a flat bed without using bedrails?: A Lot Help needed moving to and from a bed to a chair (including a wheelchair)?:  Total Help needed standing up from a chair using your arms (e.g., wheelchair or bedside chair)?: A Lot Help needed to walk in hospital room?: Total Help needed climbing 3-5 steps with a railing? : Total 6 Click Score: 10    End of Session Equipment Utilized During Treatment: Gait belt Activity Tolerance: Patient tolerated treatment well (weakness) Patient left: in chair;with call bell/phone within reach;with nursing/sitter in room Nurse Communication: Mobility status;Other (comment) (Lt hemiparesis noted) PT Visit Diagnosis: Unsteadiness on feet (R26.81);Other abnormalities of gait and mobility (R26.89);Muscle weakness (generalized) (M62.81);Difficulty in walking, not elsewhere classified (R26.2);Other symptoms and signs involving the nervous system (R29.898);Pain;Hemiplegia and hemiparesis Hemiplegia - Right/Left: Left Hemiplegia - dominant/non-dominant: Non-dominant Hemiplegia - caused by: Unspecified Pain - part of body:  (back, drain area)    Time: 9074-9044 PT Time Calculation (min) (ACUTE ONLY): 30 min   Charges:   PT Evaluation $PT Eval Moderate Complexity: 1 Mod PT Treatments $Therapeutic Activity: 8-22 mins PT General Charges $$ ACUTE PT VISIT: 1 Visit         [name] [name], PT, DPT [facility]  Rehabilitation Services Physical Therapist Office: [phone] Website: [email or web address]   [name] [name] 05/11/2024, 10:58 AM

## 2024-05-11 NOTE — Progress Notes (Signed)
 Inpatient Rehab Admissions Coordinator Note:   Per therapy recommendations patient was screened for CIR candidacy by Reche FORBES Lowers, PT. At this time, pt appears to be a potential candidate for CIR. I will place an order for rehab consult for full assessment, per our protocol.  Please contact me any with questions.SABRA Reche Lowers, PT, DPT [phone] 05/11/24 3:16 PM

## 2024-05-11 NOTE — Anesthesia Post-op Follow-up Note (Signed)
  Anesthesia Pain Follow-up Note  Patient: [name] [name]  Day #: 3  Date of Follow-up: 05/11/2024 Time: 11:29 AM  Last Vitals:  Vitals:   05/11/24 1000 05/11/24 1125  BP: (!) 149/83   Pulse: 77   Resp: (!) 21   Temp:  37.1 C  SpO2: 95%     Level of Consciousness: alert  Catheter Site Exam:clean, dry, no drainage     Plan: Request to remove lumbar drain. Platelets 95k this AM, INR 1.3 yesterday. Will repeat labs this afternoon and hopefully pull it prior to PM dose of heparin .   Norleen Pope

## 2024-05-11 NOTE — Addendum Note (Signed)
 Addendum  created 05/11/24 1623 by Darlyn Rush, MD   [facility] properties accepted

## 2024-05-12 ENCOUNTER — Ambulatory Visit: Admitting: Podiatry

## 2024-05-12 DIAGNOSIS — G8194 Hemiplegia, unspecified affecting left nondominant side: Secondary | ICD-10-CM

## 2024-05-12 DIAGNOSIS — Z8679 Personal history of other diseases of the circulatory system: Secondary | ICD-10-CM

## 2024-05-12 DIAGNOSIS — Z9889 Other specified postprocedural states: Secondary | ICD-10-CM

## 2024-05-12 DIAGNOSIS — R5381 Other malaise: Secondary | ICD-10-CM

## 2024-05-12 LAB — TYPE AND SCREEN
ABO/RH(D): A POS
Antibody Screen: NEGATIVE
Unit division: 0
Unit division: 0
Unit division: 0
Unit division: 0

## 2024-05-12 LAB — BPAM RBC
Blood Product Expiration Date: 202510022359
Blood Product Expiration Date: 202510022359
Blood Product Expiration Date: 202510032359
Blood Product Expiration Date: 202510102359
ISSUE DATE / TIME: 202509191150
ISSUE DATE / TIME: 202509191150
Unit Type and Rh: 6200
Unit Type and Rh: 6200
Unit Type and Rh: 6200
Unit Type and Rh: 6200

## 2024-05-12 LAB — GLUCOSE, CAPILLARY
Glucose-Capillary: 135 mg/dL — ABNORMAL HIGH (ref 70–99)
Glucose-Capillary: 124 mg/dL — ABNORMAL HIGH (ref 70–99)
Glucose-Capillary: 125 mg/dL — ABNORMAL HIGH (ref 70–99)
Glucose-Capillary: 128 mg/dL — ABNORMAL HIGH (ref 70–99)

## 2024-05-12 MED ORDER — HEPARIN SODIUM (PORCINE) 5000 UNIT/ML IJ SOLN
5000.0000 [IU] | Freq: Three times a day (TID) | INTRAMUSCULAR | Status: DC
  Administered 2024-05-12 – 2024-05-17 (×15): 5000 [IU] via SUBCUTANEOUS
  Filled 2024-05-12 (×15): qty 1

## 2024-05-12 NOTE — Progress Notes (Signed)
  Inpatient Rehabilitation Admissions Coordinator   Met with patient at bedside for rehab assessment. We discussed goals and expectations of a possible CIR admit.  He lives at home with his wife. Note Dr Naaman consult today. I await further progress with therapy as well as discussion with his wife concerning rehab needs and venue options. Please call me with any questions.   Heron Leavell, RN, MSN Rehab Admissions Coordinator [phone]

## 2024-05-12 NOTE — Consult Note (Signed)
 Physical Medicine and Rehabilitation Consult Reason for Consult:Impaired functional mobility Referring Physician: Sheree   HPI: [name] [name] is a 78 y.o. male recently admitted and discharged to SNF for encephalopathy with a history of CVA with residual left hemiparesis, dementia, and  previous aortic dissection who presented on 9/19 for repair of a thoracoabdominal aneurysm as well as stenting of multiple associated vessels.  Pt with post op temp and leukocytosis, thrombocytopenia, AKI. Pt was up with therapy yesterday and was mod assist for sit-std transfers with signifcant LE weakness noted by therapy with frequent knee buckling and LLE weakness noted by therapist. He has not yet attempted ADL's. Pt was independent prior to arrival, using a rollator to ambulate, living with wife in a two level home (bed/bath upstairs) with 1 step to enter.     Home: Home Living Family/patient expects to be discharged to:: Private residence Living Arrangements: Spouse/significant other Available Help at Discharge: Family, Available 24 hours/day Type of Home: House Home Access: Stairs to enter [employer] of Steps: 1 Entrance Stairs-Rails: None Home Layout: Two level, Bed/bath upstairs Alternate Level Stairs-Number of Steps: 7+7 with landing Alternate Level Stairs-Rails: Right Bathroom Shower/Tub: Walk-in shower, Tub/shower unit (walk in shower upstairs) Bathroom Toilet: Handicapped height Bathroom Accessibility: Yes Home Equipment: Tub bench, Agricultural consultant (2 wheels), Rollator (4 wheels), Hand held shower head, BSC/3in1, Grab bars - toilet, Wheelchair - manual  Functional History: Prior Function Prior Level of Function : Independent/Modified Independent Mobility Comments: rollator to ambulate ADLs Comments: ind, plays piano Functional Status:  Mobility: Bed Mobility General bed mobility comments: In recliner Transfers Overall transfer level: Needs assistance Equipment used:  Rolling walker (2 wheels) Transfers: Sit to/from Stand Sit to Stand: Mod assist, +2 physical assistance General transfer comment: Mod assist +2 for boost and balance. Needs assist to scoot to edge of recliner. Cues for anterior weight shift and hand placement. Leans posteriorly when rising. Difficulty standing fully upright. Progressed to mod assist +1 on 3rd trial. Ambulation/Gait Pre-gait activities: Progressed with forward and retrostep. Poor quad control noted Lt , resulting in buckling and hyperextension unless blocked by therapist. He was able to march in place with heavy UE reliance on RW, and bracing on Lt side from therapist.    ADL:    Cognition: Cognition Orientation Level: (P) Oriented X4 Cognition Arousal: Alert Behavior During Therapy: WFL for tasks assessed/performed   Review of Systems  Constitutional: Negative.   HENT: Negative.    Eyes: Negative.   Respiratory:  Negative for cough.   Cardiovascular:  Negative for chest pain.  Gastrointestinal:  Negative for nausea.  Genitourinary:  Positive for frequency.  Musculoskeletal:  Positive for back pain and myalgias.  Neurological:  Positive for focal weakness and weakness. Negative for sensory change.   Past Medical History:  Diagnosis Date   Chronic back pain    stenosis   Diabetes mellitus without complication (HCC)    Enlarged prostate    GERD (gastroesophageal reflux disease)    occasionally will take a zantac(maybe once a month)   History of colon polyps    History of kidney stones    History of stress test    done 10 yrs. ago, as a baseline    Hyperlipidemia    takes Crestor  daily   Hypertension    takes Amlodipine  daily and Lotensin  as well   Stroke [facility])    Past Surgical History:  Procedure Laterality Date   ANTERIOR LAT LUMBAR FUSION Left 12/15/2014  Procedure: ANTERIOR LATERAL LUMBAR FUSION 1 LEVEL;  Surgeon: Oneil Priestly, MD;  Location: [facility] OR;  Service: Orthopedics;  Laterality: Left;  Left  sided lateral lumbar interbody fusion, lumbar 3-4, posterior spinal fusion, lumbar 3-4 with instrumentation.   COLONOSCOPY     ESOPHAGOGASTRODUODENOSCOPY     fatty tissue removed from stomach     LUMBAR LAMINECTOMY/DECOMPRESSION MICRODISCECTOMY N/A 07/08/2013   Procedure: LUMBAR LAMINECTOMY/DECOMPRESSION MICRODISCECTOMY;  Surgeon: Oneil Rodgers Priestly, MD;  Location: [facility] OR;  Service: Orthopedics;  Laterality: N/A;  Lumbar 3-4, lumbar 4-5 decompression   RADIOLOGY WITH ANESTHESIA N/A 10/22/2019   Procedure: MRI WITH ANESTHESIA OF LUMBAR SPINE WITHOUT CONTRAST;  Surgeon: Radiologist, Medication, MD;  Location: [facility] OR;  Service: Radiology;  Laterality: N/A;   right ankle surgery     as child    THORACIC AORTIC ENDOVASCULAR STENT GRAFT N/A 10/15/2019   Procedure: THORACIC AORTIC ENDOVASCULAR STENT GRAFT;  Surgeon: Sheree Penne Bruckner, MD;  Location: [facility] OR;  Service: Vascular;  Laterality: N/A;   THORACOABDOMINAL AORTIC ANEURYSM REPAIR Bilateral 05/08/2024   Procedure: REPAIR, ANEURYSM, AORTA, THORACOABDOMINAL;  Surgeon: Sheree Penne Bruckner, MD;  Location: [facility] OR;  Service: Vascular;  Laterality: Bilateral;   TONSILLECTOMY     as a child   ULTRASOUND GUIDANCE FOR VASCULAR ACCESS Bilateral 05/08/2024   Procedure: ULTRASOUND GUIDANCE, FOR VASCULAR ACCESS;  Surgeon: Sheree Penne Bruckner, MD;  Location: [facility] OR;  Service: Vascular;  Laterality: Bilateral;   History reviewed. No pertinent family history. Social History:  reports that he has quit smoking. His smoking use included cigarettes. He has a 10 pack-year smoking history. He has never used smokeless tobacco. He reports that he does not drink alcohol and does not use drugs. Allergies:  Allergies  Allergen Reactions   Lyrica [Pregabalin] Nausea Only and Other (See Comments)    Hallucinations Dizziness    Sulfa Antibiotics Other (See Comments)    Unknown reaction   Medications Prior to Admission  Medication Sig Dispense Refill    acetaminophen  (TYLENOL ) 500 MG tablet Take 500 mg by mouth 2 (two) times daily.     amLODipine  (NORVASC ) 10 MG tablet Take 10 mg by mouth daily.     aspirin  EC 81 MG tablet Take 81 mg by mouth daily.     carvedilol  (COREG ) 25 MG tablet Take 1 tablet (25 mg total) by mouth 2 (two) times daily with a meal. 60 tablet 0   losartan (COZAAR) 50 MG tablet Take 50 mg by mouth daily.     memantine  (NAMENDA ) 10 MG tablet TAKE 1 TABLET BY MOUTH TWICE DAILY 60 tablet 10   metFORMIN (GLUCOPHAGE) 500 MG tablet Take 500 mg by mouth 2 (two) times daily.     Multiple Vitamins-Minerals (MENS 50+ MULTIVITAMIN) TABS Take 1 tablet by mouth daily.     rosuvastatin  (CRESTOR ) 10 MG tablet Take 10 mg by mouth at bedtime.     tamsulosin  (FLOMAX ) 0.4 MG CAPS capsule Take 1 capsule (0.4 mg total) by mouth daily. 30 capsule 0   hydrALAZINE  (APRESOLINE ) 10 MG tablet Take 1 tablet (10 mg total) by mouth 3 (three) times daily. (Patient not taking: Reported on 05/06/2024) 180 tablet 0   pantoprazole  (PROTONIX ) 40 MG tablet Take 1 tablet (40 mg total) by mouth daily. (Patient not taking: Reported on 05/06/2024) 30 tablet 0     Blood pressure (!) 161/72, pulse 69, temperature 98.3 F (36.8 C), temperature source Oral, resp. rate (!) 21, height 5' 11 (1.803 m), weight 88.5 kg, SpO2 97%. Physical  Exam Constitutional:      Appearance: He is not ill-appearing.  HENT:     Head: Normocephalic.     Right Ear: External ear normal.     Left Ear: External ear normal.     Nose: Nose normal.     Mouth/Throat:     Pharynx: Oropharynx is clear.  Eyes:     Conjunctiva/sclera: Conjunctivae normal.  Cardiovascular:     Rate and Rhythm: Normal rate.  Pulmonary:     Effort: Pulmonary effort is normal.  Abdominal:     Palpations: Abdomen is soft.  Musculoskeletal:        General: Tenderness (chest wall and groin areas) present.     Cervical back: Normal range of motion.  Skin:    General: Skin is warm.     Comments: Incisions CDI   Neurological:     Mental Status: He is alert.     Comments: Pt is alert, oriented to [place], place, reason. At times slow to process. Speech is fairly clear. Language normal. Left central VII present. MMT: RUE 4+ to 5/5. LUE 4/5 prox to distal. RLE 3-HF, 3/5 KE and 5/5 ADF/PF. LLE 2/5 HF and KE with 4 to 4+ ADF/PF. Appears to have intact LT and PP in all 4's. DTR's tr to 1+.   Psychiatric:        Mood and Affect: Mood normal.        Behavior: Behavior normal.     Results for orders placed or performed during the hospital encounter of 05/08/24 (from the past 24 hours)  Glucose, capillary     Status: Abnormal   Collection Time: 05/11/24 11:23 AM  Result Value Ref Range   Glucose-Capillary 144 (H) 70 - 99 mg/dL  Protime-INR     Status: Abnormal   Collection Time: 05/11/24  2:43 PM  Result Value Ref Range   Prothrombin Time 16.2 (H) 11.4 - 15.2 seconds   INR 1.2 0.8 - 1.2  CBC     Status: Abnormal   Collection Time: 05/11/24  2:43 PM  Result Value Ref Range   WBC 12.4 (H) 4.0 - 10.5 K/uL   RBC 3.15 (L) 4.22 - 5.81 MIL/uL   Hemoglobin 9.2 (L) 13.0 - 17.0 g/dL   HCT 71.9 (L) 60.9 - 47.9 %   MCV 88.9 80.0 - 100.0 fL   MCH 29.2 26.0 - 34.0 pg   MCHC 32.9 30.0 - 36.0 g/dL   RDW 84.1 (H) 88.4 - 84.4 %   Platelets 106 (L) 150 - 400 K/uL   nRBC 0.0 0.0 - 0.2 %  Glucose, capillary     Status: Abnormal   Collection Time: 05/11/24  4:23 PM  Result Value Ref Range   Glucose-Capillary 116 (H) 70 - 99 mg/dL  Glucose, capillary     Status: Abnormal   Collection Time: 05/11/24  9:16 PM  Result Value Ref Range   Glucose-Capillary 136 (H) 70 - 99 mg/dL  Glucose, capillary     Status: Abnormal   Collection Time: 05/12/24  6:33 AM  Result Value Ref Range   Glucose-Capillary 135 (H) 70 - 99 mg/dL   No results found.  Assessment/Plan: Diagnosis: 78 yo male with prior CVA debility after thoracobdominal aorta repair/vascular stenting.  Does the need for close, 24 hr/day medical supervision in  concert with the patient's rehab needs make it unreasonable for this patient to be served in a less intensive setting? Yes Co-Morbidities requiring supervision/potential complications:  -pain mgt -wound care -nutrition  Due to bladder management, bowel management, safety, skin/wound care, disease management, medication administration, pain management, and patient education, does the patient require 24 hr/day rehab nursing? Yes Does the patient require coordinated care of a physician, rehab nurse, therapy disciplines of PT,OT to address physical and functional deficits in the context of the above medical diagnosis(es)? Yes Addressing deficits in the following areas: balance, endurance, locomotion, strength, transferring, bowel/bladder control, bathing, dressing, feeding, grooming, toileting, and psychosocial support Can the patient actively participate in an intensive therapy program of at least 3 hrs of therapy per day at least 5 days per week? Yes The potential for patient to make measurable gains while on inpatient rehab is excellent Anticipated functional outcomes upon discharge from inpatient rehab are supervision  with PT, supervision and min assist with OT, n/a with SLP. Estimated rehab length of stay to reach the above functional goals is: 8-11 days Anticipated discharge destination: Home Overall Rehab/Functional Prognosis: excellent  POST ACUTE RECOMMENDATIONS: This patient's condition is appropriate for continued rehabilitative care in the following setting: CIR Patient has agreed to participate in recommended program. Yes Note that insurance prior authorization may be required for reimbursement for recommended care.  Comment: Pt can stay on first floor if needed. Wife is available to assist. Rehab Admissions Coordinator to follow up.      I have personally performed a face to face diagnostic evaluation of this patient. Additionally, I have examined the patient's medical record  including any pertinent labs and radiographic images.    Thanks,  Arthea ONEIDA Gunther, MD 05/12/2024

## 2024-05-12 NOTE — Progress Notes (Signed)
 Physical Therapy Treatment Patient Details Name: [name] [name] MRN: [id number] DOB: 08-Sep-1945 Today's Date: 05/12/2024   History of Present Illness 78 y.o. male admitted  9/19 s/p Thoracic aortic aneurysm repair; Open exposure left axillary artery. 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    PT Comments  Still limited primarily by recrudescence of left his hemiparetic symptoms, resulting in left leg buckling and significant difficulty coordinating, weight shifting, and advancing LLE with step pivot transfers. This does feel slightly stronger today from my visit yesterday with MMT of knee extension but still falls into hyperextension when weight bearing. +2 mod assist for transfer training with RW (able to grip adequately to improve stability during transfer.) Patient will continue to benefit from skilled physical therapy services to further improve independence with functional mobility. Patient will benefit from intensive inpatient follow-up therapy, >3 hours/day    If plan is discharge home, recommend the following: Two people to help with walking and/or transfers;Two people to help with bathing/dressing/bathroom;Assistance with cooking/housework;Direct supervision/assist for medications management;Direct supervision/assist for financial management;Assist for transportation;Help with stairs or ramp for entrance;Supervision due to cognitive status   Can travel by private vehicle        Equipment Recommendations  None recommended by PT    Recommendations for Other Services Rehab consult     Precautions / Restrictions Precautions Precautions: Fall;Other (comment) Recall of Precautions/Restrictions: Impaired Precaution/Restrictions Comments: open exposure left axillary artery. lumbar drain Restrictions Weight Bearing Restrictions Per Provider  Order: No     Mobility  Bed Mobility Overal bed mobility: Needs Assistance Bed Mobility: Rolling, Sidelying to Sit Rolling: Min assist Sidelying to sit: Min assist, HOB elevated       General bed mobility comments: Min assist to facilitate/sequence LEs off edge of bed initially. Min assist for trunk support to rise. Holds heart pillow for comfort and maintains sternal precautions.    Transfers Overall transfer level: Needs assistance Equipment used: Rolling walker (2 wheels) Transfers: Sit to/from Stand, Bed to chair/wheelchair/BSC Sit to Stand: Mod assist, +2 physical assistance, +2 safety/equipment   Step pivot transfers: Mod assist, +2 physical assistance, +2 safety/equipment       General transfer comment: Mod assist for boost and balance with cues for technique and anterior weight shift. Slow to rise, Lt knee hyperextends and braces against bed. Needs assist to control descent into chair as well. Mod assist +2 to facilitate weight shift, advance LLE and block Lt knee with step pivot transfer to recliner. Max cues for sequencing. Pt does pick up Lt foot but cannot sequence weight shift and clearance enough to advance on his own. He did tolerate gripping RW with BIL UEs.    Ambulation/Gait                   Stairs             Wheelchair Mobility     Tilt Bed    Modified Rankin (Stroke Patients Only)       Balance Overall balance assessment: Needs assistance Sitting-balance support: Single extremity supported, Feet supported Sitting balance-Leahy Scale: Poor     Standing balance support: Bilateral upper extremity supported Standing balance-Leahy Scale: Poor                              Communication Communication Communication: No apparent difficulties  Cognition Arousal: Alert Behavior During Therapy: WFL for tasks assessed/performed   PT - Cognitive impairments: No family/caregiver present to determine baseline, Awareness,  Memory, Sequencing, Problem solving                         Following commands: Impaired Following commands impaired: Only follows one step commands consistently, Follows one step commands with increased time    Cueing Cueing Techniques: Verbal cues, Gestural cues  Exercises      General Comments General comments (skin integrity, edema, etc.): HR 74, BP 152/72 SpO2 96% on RA.      Pertinent Vitals/Pain Pain Assessment Pain Assessment: Faces Faces Pain Scale: Hurts little more Pain Location: back Pain Descriptors / Indicators: Aching, Grimacing Pain Intervention(s): Limited activity within patient's tolerance, Monitored during session, Repositioned    Home Living                          Prior Function            PT Goals (current goals can now be found in the care plan section) Acute Rehab PT Goals Patient Stated Goal: Get well, go home PT Goal Formulation: With patient Time For Goal Achievement: 05/25/24 Potential to Achieve Goals: Good Progress towards PT goals: Progressing toward goals    Frequency    Min 2X/week      PT Plan      Co-evaluation PT/OT/SLP Co-Evaluation/Treatment: Yes Reason for Co-Treatment: Complexity of the patient's impairments (multi-system involvement);Necessary to address cognition/behavior during functional activity;For patient/therapist safety;To address functional/ADL transfers PT goals addressed during session: Mobility/safety with mobility;Balance;Proper use of DME        AM-PAC PT 6 Clicks Mobility   Outcome Measure  Help needed turning from your back to your side while in a flat bed without using bedrails?: A Little Help needed moving from lying on your back to sitting on the side of a flat bed without using bedrails?: A Lot Help needed moving to and from a bed to a chair (including a wheelchair)?: Total Help needed standing up from a chair using your arms (e.g., wheelchair or bedside chair)?: A  Lot Help needed to walk in hospital room?: Total Help needed climbing 3-5 steps with a railing? : Total 6 Click Score: 10    End of Session Equipment Utilized During Treatment: Gait belt Activity Tolerance: Patient tolerated treatment well (weakness) Patient left: in chair;with call bell/phone within reach;with chair alarm set;with SCD's reapplied Nurse Communication: Mobility status;Need for lift equipment (Lt hemiparesis noted. Use Stedy) PT Visit Diagnosis: Unsteadiness on feet (R26.81);Other abnormalities of gait and mobility (R26.89);Muscle weakness (generalized) (M62.81);Difficulty in walking, not elsewhere classified (R26.2);Other symptoms and signs involving the nervous system (R29.898);Pain;Hemiplegia and hemiparesis Hemiplegia - Right/Left: Left Hemiplegia - dominant/non-dominant: Non-dominant Hemiplegia - caused by: Unspecified Pain - part of body:  (back)     Time: 8780-8759 PT Time Calculation (min) (ACUTE ONLY): 21 min  Charges:    $Therapeutic Activity: 8-22 mins PT General Charges $$ ACUTE PT VISIT: 1 Visit                     Leontine Roads, PT, DPT [facility]  Rehabilitation Services Physical Therapist Office: [phone] Website: [email or web address]    Leontine GORMAN Roads 05/12/2024, 1:12 PM

## 2024-05-12 NOTE — Evaluation (Signed)
 Occupational Therapy Evaluation Patient Details Name: [name] [name] MRN: [id number] DOB: [birth year] Today's Date: 05/12/2024   History of Present Illness   78 y.o. male admitted  9/19 s/p Thoracic aortic aneurysm repair; Open exposure left axillary artery. 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     Clinical Impressions Patient is s/p thoracic aortic aneurysm repair surgery resulting in functional limitations due to the deficits listed below (see OT problem list). Pt at baseline is indep with ambulation in the home, indep with adls and enjoys playing the keyboard. 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 Pt currently with increased L side weakness and required two Roper (A) for safety. Recommend RN staff use steady for transfers.  Patient will benefit from skilled OT acutely to increase independence and safety with ADLS to allow discharge Patient will benefit from intensive inpatient follow-up therapy, >3 hours/day .      If plan is discharge home, recommend the following:   Two people to help with walking and/or transfers;Two people to help with bathing/dressing/bathroom     Functional Status Assessment   Patient has had a recent decline in their functional status and demonstrates the ability to make significant improvements in function in a reasonable and predictable amount of time.     Equipment Recommendations   BSC/3in1;Other (comment) (RW)     Recommendations for Other Services   Rehab consult     Precautions/Restrictions   Precautions Precautions: Fall;Other (comment) Recall of Precautions/Restrictions: Impaired Restrictions Weight Bearing Restrictions Per Provider Order: No     Mobility Bed Mobility Overal bed mobility: Needs Assistance Bed Mobility: Rolling, Sidelying to Sit Rolling: Min assist Sidelying  to sit: Min assist, HOB elevated       General bed mobility comments: Min assist to facilitate/sequence LEs off edge of bed initially. requires (A) of the pad to scoot to eob    Transfers Overall transfer level: Needs assistance Equipment used: Rolling walker (2 wheels) Transfers: Sit to/from Stand, Bed to chair/wheelchair/BSC Sit to Stand: Mod assist, +2 physical assistance, +2 safety/equipment     Step pivot transfers: Mod assist, +2 physical assistance, +2 safety/equipment     General transfer comment: Mod assist for boost and balance with cues for technique and anterior weight shift. Slow to rise, Lt knee hyperextends and braces against bed. Needs assist to control descent into chair as well. Mod assist +2 to facilitate weight shift, advance LLE and block Lt knee with step pivot transfer to recliner. Max cues for sequencing. Pt does pick up Lt foot but cannot sequence weight shift and clearance enough to advance on his own. He did tolerate gripping RW with BIL UEs.      Balance Overall balance assessment: Needs assistance Sitting-balance support: Single extremity supported, Feet supported Sitting balance-Leahy Scale: Poor     Standing balance support: Bilateral upper extremity supported Standing balance-Leahy Scale: Poor                             ADL either performed or assessed with clinical judgement   ADL Overall ADL's : Needs assistance/impaired Eating/Feeding: Set up;Sitting Eating/Feeding Details (indicate cue type and reason): cues to initiate Grooming: Wash/dry face;Set up;Sitting   Upper Body Bathing: Moderate assistance               Toilet Transfer: +2 for physical assistance;Moderate  assistance             General ADL Comments: pt transfer from supine to chair this session. pt with cues for weight shifting and tactile cues required     Vision Baseline Vision/History: 1 Wears glasses Ability to See in Adequate Light: 0  Adequate Vision Assessment?:  (Ot helping patient don glasses and encouraging for best vision. Ot to further assess but currently no noted deficits)     Perception         Praxis         Pertinent Vitals/Pain Pain Assessment Pain Assessment: Faces Faces Pain Scale: Hurts little more Pain Location: back Pain Descriptors / Indicators: Aching, Grimacing Pain Intervention(s): Monitored during session, Repositioned     Extremity/Trunk Assessment Upper Extremity Assessment Upper Extremity Assessment: Right hand dominant;LUE deficits/detail LUE Deficits / Details: AROM shoulder wrist elbow digits present. pt able to compelte hand to mouth, pt able to complete finger to nose accurately LUE Coordination: decreased fine motor   Lower Extremity Assessment Lower Extremity Assessment: Defer to PT evaluation;LLE deficits/detail LLE Deficits / Details: weakness  noted   Cervical / Trunk Assessment Cervical / Trunk Assessment: Kyphotic   Communication Communication Communication: No apparent difficulties   Cognition Arousal: Alert Behavior During Therapy: WFL for tasks assessed/performed Cognition: History of cognitive impairments             OT - Cognition Comments: appropriate for all task functionally during session. pt has hx dementia noted in chart                 Following commands: Impaired Following commands impaired: Only follows one step commands consistently, Follows one step commands with increased time     Cueing  General Comments   Cueing Techniques: Verbal cues;Gestural cues  RA VSS   Exercises     Shoulder Instructions      Home Living Family/patient expects to be discharged to:: Private residence Living Arrangements: Spouse/significant other Available Help at Discharge: Family;Available 24 hours/day Type of Home: House Home Access: Stairs to enter [employer] of Steps: 1 Entrance Stairs-Rails: None Home Layout: Two level;Bed/bath  upstairs Alternate Level Stairs-Number of Steps: 7+7 with landing Alternate Level Stairs-Rails: Right Bathroom Shower/Tub: Walk-in shower;Tub/shower unit (walk in shower upstairs)   Bathroom Toilet: Handicapped height Bathroom Accessibility: Yes   Home Equipment: Tub bench;Rolling Walker (2 wheels);Rollator (4 wheels);Hand held shower head;BSC/3in1;Grab bars - toilet;Wheelchair - manual   Additional Comments: Enjoys watching TV and walking outside on his driveway      Prior Functioning/Environment Prior Level of Function : Independent/Modified Independent             Mobility Comments: rollator to ambulate ADLs Comments: ind, plays piano    OT Problem List: Decreased strength;Impaired balance (sitting and/or standing);Decreased activity tolerance;Decreased cognition;Decreased knowledge of precautions;Decreased knowledge of use of DME or AE;Decreased safety awareness;Cardiopulmonary status limiting activity   OT Treatment/Interventions: Self-care/ADL training;Therapeutic exercise;Energy conservation;DME and/or AE instruction;Manual therapy;Therapeutic activities;Cognitive remediation/compensation;Patient/family education;Balance training      OT Goals(Current goals can be found in the care plan section)   Acute Rehab OT Goals Patient Stated Goal: to get more therapy OT Goal Formulation: With patient/family Time For Goal Achievement: 05/26/24 Potential to Achieve Goals: Good   OT Frequency:  Min 2X/week    Co-evaluation PT/OT/SLP Co-Evaluation/Treatment: Yes Reason for Co-Treatment: Complexity of the patient's impairments (multi-system involvement);Necessary to address cognition/behavior during functional activity;For patient/therapist safety;To address functional/ADL transfers PT goals addressed during session: Mobility/safety with mobility;Balance;Proper use  of DME OT goals addressed during session: ADL's and self-care      AM-PAC OT 6 Clicks Daily Activity      Outcome Measure Help from another Mauss eating meals?: A Little Help from another [place] taking care of personal grooming?: A Little Help from another Riles toileting, which includes using toliet, bedpan, or urinal?: A Lot Help from another Resch bathing (including washing, rinsing, drying)?: A Lot Help from another Tabares to put on and taking off regular upper body clothing?: A Lot Help from another B[place] to put on and taking off regular lower body clothing?: A Lot 6 Click Score: 14   End of Session Equipment Utilized During Treatment: Rolling walker (2 wheels) Nurse Communication: Mobility status;Precautions  Activity Tolerance: Patient tolerated treatment well Patient left: in chair;with call bell/phone within reach;with chair alarm set  OT Visit Diagnosis: Unsteadiness on feet (R26.81);Muscle weakness (generalized) (M62.81)                Time: 8778-8759 OT Time Calculation (min): 19 min Charges:  OT General Charges $OT Visit: 1 Visit OT Evaluation $OT Eval Moderate Complexity: 1 Mod   Brynn, OTR/L  Acute Rehabilitation Services Office: [phone] .   Ely Molt 05/12/2024, 1:51 PM

## 2024-05-12 NOTE — PMR Pre-admission (Signed)
 PMR Admission Coordinator Pre-Admission Assessment  Patient: [name] [name] is an 78 y.o., male MRN: [id number] DOB: October 14, 1945 Height: 5' 11 (180.3 cm) Weight: 85.8 kg            Insurance Information HMO: HMO/POS    PPO:      PCP:      IPA:      80/20:      OTHER:  PRIMARY: UHC Medicare      Policy#: [id number] ; Medicare [id number]     Subscriber: pt CM Name: UM dept      Phone#: [phone] option 3     Fax#: [phone] Pre-Cert#: [id number] Auth for CIR from [facility] medicare with Jon  for admit 05/14/24 with next review date 05/21/24.  Updates due to um dept at fax listed above.      Employer:  Benefits:  Phone #: [phone]     Name: 9/25 Eff. Date: 08/21/23     Deduct: none      Out of Pocket Max: $3900      Life Max: none  CIR: $300 co pay per day days 1 until 5      SNF: no co pay per day days 1 until 20; $203 co pay per day days 21 until 100 Outpatient: $ 20 per visit     Co-Pay:  Home Health: 100%      Co-Pay:  DME: 80%     Co-Pay: 20% Providers: in network  SECONDARY: none      Policy#:       Phone#:   Artist:       Phone#:   The Data processing manager" for patients in Inpatient Rehabilitation Facilities with attached "Privacy Act Statement-Health Care Records" was provided and verbally reviewed with: Patient  Emergency Contact Information Contact Information     Name Relation Home Work Mobile   Cotterman,[name] Spouse [phone]  [phone]      Other Contacts   None on File    Current Medical History  Patient Admitting Diagnosis: debility secondary to thoracoabdominal aorta repair/vascular stenting  History of Present Illness: Pt is a 78 yo male with history of  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 Recent admit with bilateral LE weakness ultimately found to have diffuse encephalopathy. Discharged to  [facility] 03/05/24.    He Presented to [facility] on 05/08/24 for repair of a thoracoabdominal aneurysm with stenting of multiple associated vessels. Postoperative temp with leukocytosis, thrombocytopenia and AKI. Neurology was consulted for left-sided weakness and MRI brain demonstrating acute ischemic right cerebellar infarct. Neurology does not feel that his left-sided weakness is due to cerebellar infarct. MRI C/T spine showed no spinal cord infarctMRA head/neck with  Asymmetric attenuation of the proximal right CCA. 2D Echo EF 60 to 65% CTA of the head/neck showed no LVO. Pt. Seen by PT/OT and they recommend CIR to assist return to PLOF.     Patient's medical record from [facility] has been reviewed by the rehabilitation admission coordinator and physician.  Past Medical History  Past Medical History:  Diagnosis Date   Chronic back pain    stenosis   Diabetes mellitus without complication (HCC)    Enlarged prostate    GERD (gastroesophageal reflux disease)    occasionally will take a zantac(maybe once a month)   History of colon polyps    History  of kidney stones    History of stress test    done 10 yrs. ago, as a baseline    Hyperlipidemia    takes Crestor  daily   Hypertension    takes Amlodipine  daily and Lotensin  as well   Stroke [facility])    Has the patient had major surgery during 100 days prior to admission? Yes  Family History  family history is not on file.  Current Medications   Current Facility-Administered Medications:    0.9 %  sodium chloride  infusion, 10 mL/hr, Intravenous, Once, Eveland, Matthew, PA-C   acetaminophen  (TYLENOL ) tablet 325-650 mg, 325-650 mg, Oral, Q4H PRN, 650 mg at 05/10/24 2257 **OR** acetaminophen  (TYLENOL ) suppository 325-650 mg, 325-650 mg, Rectal, Q4H PRN, Eveland, Matthew, PA-C   aspirin  EC tablet 81 mg, 81 mg, Oral, Daily, Eveland, Matthew, PA-C, 81 mg at 05/14/24 9160   bisacodyl  (DULCOLAX)  suppository 10 mg, 10 mg, Rectal, Daily PRN, Eveland, Matthew, PA-C   docusate sodium  (COLACE) capsule 100 mg, 100 mg, Oral, Daily, Eveland, Matthew, PA-C, 100 mg at 05/14/24 9160   heparin  injection 5,000 Units, 5,000 Units, Subcutaneous, Q8H, Magda Debby SAILOR, MD, 5,000 Units at 05/14/24 1426   HYDROmorphone  (DILAUDID ) injection 0.5 mg, 0.5 mg, Intravenous, Q3H PRN, Eveland, Matthew, PA-C, 0.5 mg at 05/08/24 2005   insulin  aspart (novoLOG ) injection 0-9 Units, 0-9 Units, Subcutaneous, TID WC, Eveland, Matthew, PA-C, 1 Units at 05/14/24 1202   memantine  (NAMENDA ) tablet 10 mg, 10 mg, Oral, BID, Eveland, Matthew, PA-C, 10 mg at 05/14/24 9160   metoprolol  tartrate (LOPRESSOR ) injection 2.5-5 mg, 2.5-5 mg, Intravenous, Q2H PRN, Eveland, Matthew, PA-C   multivitamin with minerals tablet 1 tablet, 1 tablet, Oral, Daily, Eveland, Matthew, PA-C, 1 tablet at 05/14/24 9160   Oral care mouth rinse, 15 mL, Mouth Rinse, PRN, Eveland, Matthew, PA-C   oxyCODONE -acetaminophen  (PERCOCET/ROXICET) 5-325 MG per tablet 1-2 tablet, 1-2 tablet, Oral, Q4H PRN, Eveland, Matthew, PA-C, 1 tablet at 05/09/24 2324   pantoprazole  (PROTONIX ) EC tablet 40 mg, 40 mg, Oral, Daily, Eveland, Matthew, PA-C, 40 mg at 05/14/24 0839   phenol (CHLORASEPTIC) mouth spray 1 spray, 1 spray, Mouth/Throat, PRN, Eveland, Matthew, PA-C   polyethylene glycol (MIRALAX  / GLYCOLAX ) packet 17 g, 17 g, Oral, Daily PRN, Eveland, Matthew, PA-C, 17 g at 05/13/24 1607   potassium chloride  SA (KLOR-CON  M) CR tablet 40-60 mEq, 40-60 mEq, Oral, Daily PRN, Eveland, Matthew, PA-C, 40 mEq at 05/10/24 9471   rosuvastatin  (CRESTOR ) tablet 10 mg, 10 mg, Oral, QHS, Eveland, Matthew, PA-C, 10 mg at 05/13/24 2207   tamsulosin  (FLOMAX ) capsule 0.4 mg, 0.4 mg, Oral, Daily, Eveland, Matthew, PA-C, 0.4 mg at 05/14/24 9160  Patients Current Diet:  Diet Order             Diet heart healthy/carb modified Room service appropriate? No; Fluid consistency: Thin  Diet  effective now                  Precautions / Restrictions Precautions Precautions: Fall, Other (comment) Precaution/Restrictions Comments: incision near L clavicle (axillary artery) and bil groins Restrictions Weight Bearing Restrictions Per Provider Order: No   Has the patient had 2 or more falls or a fall with injury in the past year?No  Prior Activity Level Community (5-7x/wk): Mod I with rollator  Prior Functional Level Prior Function Prior Level of Function : Independent/Modified Independent Mobility Comments: rollator to ambulate ADLs Comments: ind, plays piano  Self Care: Did the patient need help bathing, dressing, using the toilet or  eating?  Needed some help  Indoor Mobility: Did the patient need assistance with walking from room to room (with or without device)? Independent  Stairs: Did the patient need assistance with internal or external stairs (with or without device)? Independent  Functional Cognition: Did the patient need help planning regular tasks such as shopping or remembering to take medications? Needed some help  Patient Information Are you of Hispanic, Latino/a,or Spanish origin?: A. No, not of Hispanic, Latino/a, or Spanish origin What is your race?: B. Black or African American Do you need or want an interpreter to communicate with a doctor or health care staff?: 0. No  Patient's Response To:  Health Literacy and Transportation Is the patient able to respond to health literacy and transportation needs?: Yes Health Literacy - How often do you need to have someone help you when you read instructions, pamphlets, or other written material from your doctor or pharmacy?: Never In the past 12 months, has lack of transportation kept you from medical appointments or from getting medications?: No In the past 12 months, has lack of transportation kept you from meetings, work, or from getting things needed for daily living?: No  Home Assistive Devices /  Equipment Home Equipment: Tub bench, Agricultural consultant (2 wheels), Rollator (4 wheels), Hand held shower head, BSC/3in1, Grab bars - toilet, Wheelchair - manual  Prior Device Use: Indicate devices/aids used by the patient prior to current illness, exacerbation or injury? rollator  Current Functional Level Cognition  Orientation Level: Oriented X4    Extremity Assessment (includes Sensation/Coordination)  Upper Extremity Assessment: Right hand dominant, LUE deficits/detail LUE Deficits / Details: AROM shoulder wrist elbow digits present. pt able to compelte hand to mouth, pt able to complete finger to nose accurately LUE Coordination: decreased fine motor  Lower Extremity Assessment: Defer to PT evaluation, LLE deficits/detail LLE Deficits / Details: weakness  noted    ADLs  Overall ADL's : Needs assistance/impaired Eating/Feeding: Set up, Sitting Eating/Feeding Details (indicate cue type and reason): cues to initiate Grooming: Wash/dry face, Set up, Sitting Upper Body Bathing: Moderate assistance Toilet Transfer: +2 for physical assistance, Moderate assistance General ADL Comments: pt transfer from supine to chair this session. pt with cues for weight shifting and tactile cues required    Mobility  Overal bed mobility: Needs Assistance Bed Mobility: Supine to Sit Rolling: Min assist Sidelying to sit: HOB elevated, Mod assist, +2 for safety/equipment, Used rails General bed mobility comments: Min to ModA to facilitate/sequence LEs off edge of bed initially. requires (A) of the pad to scoot to EOB and for trunk lifting. Posterior and R truncal instability with transfer to EOB.    Transfers  Overall transfer level: Needs assistance Equipment used: Rolling walker (2 wheels) Transfers: Sit to/from Stand, Bed to chair/wheelchair/BSC Sit to Stand: Mod assist, +2 physical assistance, +2 safety/equipment, From elevated surface Bed to/from chair/wheelchair/BSC transfer type:: Step  pivot Step pivot transfers: Mod assist, +2 physical assistance, +2 safety/equipment General transfer comment: Mod assist for boost and balance with cues for technique and anterior weight shift. Slow to rise, pt needs assist to control descent into chair as well. Mod assist +2 to facilitate weight shift, advance LLE and block Lt knee with step pivot transfer to recliner on his L side. Max cues for sequencing. Pt does pick up Lt foot but cannot sequence weight shift and clearance enough to advance on his own. He did tolerate gripping RW with UE but needs assist to move RW appropriately. R lean once  seated on chair.    Ambulation / Gait / Stairs / Wheelchair Mobility  Ambulation/Gait Pre-gait activities: Progressed with forward and retrostep. Poor quad control noted Lt , resulting in buckling and hyperextension unless blocked by therapist. He was able to march in place with heavy UE reliance on RW, and bracing on Lt side from therapist.    Posture / Balance Balance Overall balance assessment: Needs assistance Sitting-balance support: Single extremity supported, Feet supported Sitting balance-Leahy Scale: Poor Postural control: Right lateral lean, Posterior lean Standing balance support: Bilateral upper extremity supported Standing balance-Leahy Scale: Poor Standing balance comment: RW and +2 external assist for dynamic standing tasks    Special considerations/ Life events      Previous Home Environment  Living Arrangements: Spouse/significant other  Lives With: Spouse Available Help at Discharge: Family, Available 24 hours/day Type of Home: House Home Layout: Two level, Bed/bath upstairs Alternate Level Stairs-Rails: Right Alternate Level Stairs-Number of Steps: 7+7 with landing Home Access: Stairs to enter Entrance Stairs-Rails: None Entrance Stairs-Number of Steps: 1 Bathroom Shower/Tub: Psychologist, counselling, Tub/shower unit (walk in shower upstairs) Bathroom Toilet: Handicapped  height Bathroom Accessibility: Yes Home Care Services: Yes Type of Home Care Services: Home RN Home Care Agency (if known): [facility] Additional Comments: Enjoys watching TV and walking outside on his driveway  Discharge Living Setting Plans for Discharge Living Setting: Patient's home, House, Lives with (comment) (wife) Type of Home at Discharge: House Discharge Home Layout: Bed/bath upstairs, Two level Alternate Level Stairs-Rails: Right Alternate Level Stairs-Number of Steps: 7 + 7 with landing Discharge Home Access: Stairs to enter Entrance Stairs-Rails: None Entrance Stairs-Number of Steps: 1 Discharge Bathroom Shower/Tub: Walk-in shower Discharge Bathroom Toilet: Handicapped height Does the patient have any problems obtaining your medications?: No  Social/Family/Support Systems Patient Roles: Spouse Contact Information: wife, Velia Anticipated Caregiver: wife Anticipated Industrial/product designer Information: see contacts Ability/Limitations of Caregiver: no limitations, she is 78 yo Caregiver Availability: 24/7 Discharge Plan Discussed with Primary Caregiver: Yes Is Caregiver In Agreement with Plan?: Yes Does Caregiver/Family have Issues with Lodging/Transportation while Pt is in Rehab?: No  Goals Patient/Family Goal for Rehab: supervision PT, supervision to min OT Expected length of stay: ELOS 8 to 11 days Pt/Family Agrees to Admission and willing to participate: Yes Program Orientation Provided & Reviewed with Pt/Caregiver Including Roles  & Responsibilities: Yes  Decrease burden of Care through IP rehab admission:   Possible need for SNF placement upon discharge:recent SNF at [facility] admitted 03/05/14.  CIR 10/24/19 until 11/13/19 with Dr Carilyn and discharge home with wife  Patient Condition: This patient's condition remains as documented in the consult dated 05/12/24, in which the Rehabilitation Physician determined and documented that the patient's  condition is appropriate for intensive rehabilitative care in an inpatient rehabilitation facility. Will admit to inpatient rehab today.  Preadmission Screen Completed By:  Alison Heron Lot, RN MSN 05/14/2024 3:56 PM ______________________________________________________________________   Discussed status with Dr. Lorilee on 05/17/24 at 05/17/24 and received approval for admission today.  Admission Coordinator:  Alison Heron Lot, RN MSN time 950 Date 05/17/24

## 2024-05-12 NOTE — Progress Notes (Addendum)
 Progress Note    05/12/2024 8:04 AM 4 Days Post-Op  Subjective:  feeling well. No complaints   Vitals:   05/12/24 0600 05/12/24 0700  BP: (!) 161/72   Pulse: 69   Resp: (!) 21   Temp:  98.3 F (36.8 C)  SpO2: 97%    Physical Exam: Lungs:  non labored Incisions:  L chest c/d/I without hematoma; groin incisions well appearing Extremities:  palpable DP pulses BLE Abdomen:  soft, NT, ND Neurologic: A&O  CBC    Component Value Date/Time   WBC 12.4 (H) 05/11/2024 1443   RBC 3.15 (L) 05/11/2024 1443   HGB 9.2 (L) 05/11/2024 1443   HGB 12.7 (L) 11/14/2020 1031   HCT 28.0 (L) 05/11/2024 1443   HCT 39.1 11/14/2020 1031   PLT 106 (L) 05/11/2024 1443   PLT 157 11/14/2020 1031   MCV 88.9 05/11/2024 1443   MCV 90 11/14/2020 1031   MCH 29.2 05/11/2024 1443   MCHC 32.9 05/11/2024 1443   RDW 15.8 (H) 05/11/2024 1443   RDW 15.6 (H) 11/14/2020 1031   LYMPHSABS 1.0 03/04/2024 0446   LYMPHSABS 1.0 11/14/2020 1031   MONOABS 1.0 03/04/2024 0446   EOSABS 0.2 03/04/2024 0446   EOSABS 0.3 11/14/2020 1031   BASOSABS 0.0 03/04/2024 0446   BASOSABS 0.1 11/14/2020 1031    BMET    Component Value Date/Time   NA 138 05/11/2024 0235   NA 145 (H) 11/14/2020 1031   K 3.7 05/11/2024 0235   CL 107 05/11/2024 0235   CO2 22 05/11/2024 0235   GLUCOSE 151 (H) 05/11/2024 0235   BUN 15 05/11/2024 0235   BUN 12 11/14/2020 1031   CREATININE 1.49 (H) 05/11/2024 0235   CALCIUM  8.1 (L) 05/11/2024 0235   GFRNONAA 47 (L) 05/11/2024 0235   GFRAA >60 05/09/2020 1228    INR    Component Value Date/Time   INR 1.2 05/11/2024 1443     Intake/Output Summary (Last 24 hours) at 05/12/2024 0804 Last data filed at 05/12/2024 0328 Gross per 24 hour  Intake 720 ml  Output 1350 ml  Net -630 ml     Assessment/Plan:  78 y.o. male is s/p   1.  Thoracic aortic aneurysm repair with Gore conformable CTAG 40 x 40 x 10 cm 2.  Endovascular repair of juxtarenal aortic aneurysm with main body right 37 x  20 x 160 cm Gore excluder thoracoabdominal branched endoprosthesis extended with 23 x 14 x 16 cm Gore excluder and contralateral left limb 16 x 13.5 cm 3.  Stent of celiac artery with 9 x 59 mm VBX, stent of SMA with 9 x 79 mm VBX, stent left renal artery with two 7 x 79 mm VBX, stent of right renal artery with 7 x 59 and 7 x 79 mm VBX 4.  Open exposure left axillary artery 5.  Percutaneous ultrasound-guided cannulation and proglide closure bilateral common femoral arteries 6.  Intravascular ultrasound of aorta  4 Days Post-Op   BLE well perfused with palpable DP pulses.  Groin incisions healing well.  Moving BLE after lumbar drain removal.  L hand well perfused with palpable radial. OOB with therapy yesterday.  Recommending CIR.  Tolerating a regular diet.  Labs pending.  Ok to transfer to 4e   Donnice Sender, PA-C [facility] [phone] 05/12/2024 8:04 AM  I have independently interviewed and examined patient and agree with PA assessment and plan above.   Kristian Hazzard C. Sheree, MD [facility]  [facility] Office: [phone] Pager: [phone]

## 2024-05-12 NOTE — Plan of Care (Signed)
  Problem: Education: Goal: Knowledge of General Education information will improve Description: Including pain rating scale, medication(s)/side effects and non-pharmacologic comfort measures Outcome: Progressing   Problem: Health Behavior/Discharge Planning: Goal: Ability to manage health-related needs will improve Outcome: Progressing   Problem: Clinical Measurements: Goal: Ability to maintain clinical measurements within normal limits will improve Outcome: Progressing Goal: Will remain free from infection Outcome: Progressing Goal: Diagnostic test results will improve Outcome: Progressing Goal: Respiratory complications will improve Outcome: Progressing Goal: Cardiovascular complication will be avoided Outcome: Progressing   Problem: Activity: Goal: Risk for activity intolerance will decrease Outcome: Progressing   Problem: Nutrition: Goal: Adequate nutrition will be maintained Outcome: Progressing   Problem: Coping: Goal: Level of anxiety will decrease Outcome: Progressing   Problem: Elimination: Goal: Will not experience complications related to bowel motility Outcome: Progressing Goal: Will not experience complications related to urinary retention Outcome: Progressing   Problem: Pain Managment: Goal: General experience of comfort will improve and/or be controlled Outcome: Progressing   Problem: Safety: Goal: Ability to remain free from injury will improve Outcome: Progressing   Problem: Skin Integrity: Goal: Risk for impaired skin integrity will decrease Outcome: Progressing   Problem: Education: Goal: Ability to describe self-care measures that may prevent or decrease complications (Diabetes Survival Skills Education) will improve Outcome: Progressing Goal: Individualized Educational Video(s) Outcome: Progressing   Problem: Coping: Goal: Ability to adjust to condition or change in health will improve Outcome: Progressing   Problem: Fluid  Volume: Goal: Ability to maintain a balanced intake and output will improve Outcome: Progressing   Problem: Health Behavior/Discharge Planning: Goal: Ability to identify and utilize available resources and services will improve Outcome: Progressing Goal: Ability to manage health-related needs will improve Outcome: Progressing   Problem: Metabolic: Goal: Ability to maintain appropriate glucose levels will improve Outcome: Progressing   Problem: Nutritional: Goal: Maintenance of adequate nutrition will improve Outcome: Progressing Goal: Progress toward achieving an optimal weight will improve Outcome: Progressing   Problem: Skin Integrity: Goal: Risk for impaired skin integrity will decrease Outcome: Progressing   Problem: Tissue Perfusion: Goal: Adequacy of tissue perfusion will improve Outcome: Progressing   Problem: Education: Goal: Knowledge of discharge needs will improve Outcome: Progressing   Problem: Clinical Measurements: Goal: Postoperative complications will be avoided or minimized Outcome: Progressing   Problem: Respiratory: Goal: Will achieve and/or maintain a regular respiratory rate, without signs or symptoms of dyspnea Outcome: Progressing   Problem: Skin Integrity: Goal: Demonstration of wound healing without infection will improve Outcome: Progressing

## 2024-05-13 LAB — BASIC METABOLIC PANEL WITH GFR
Anion gap: 9 (ref 5–15)
BUN: 16 mg/dL (ref 8–23)
CO2: 21 mmol/L — ABNORMAL LOW (ref 22–32)
Calcium: 8.2 mg/dL — ABNORMAL LOW (ref 8.9–10.3)
Chloride: 107 mmol/L (ref 98–111)
Creatinine, Ser: 1.19 mg/dL (ref 0.61–1.24)
GFR, Estimated: >60 mL/min (ref >60)
Glucose, Bld: 139 mg/dL — ABNORMAL HIGH (ref 70–99)
Potassium: 3.8 mmol/L (ref 3.5–5.1)
Sodium: 137 mmol/L (ref 135–145)

## 2024-05-13 LAB — GLUCOSE, CAPILLARY
Glucose-Capillary: 126 mg/dL — ABNORMAL HIGH (ref 70–99)
Glucose-Capillary: 123 mg/dL — ABNORMAL HIGH (ref 70–99)
Glucose-Capillary: 120 mg/dL — ABNORMAL HIGH (ref 70–99)
Glucose-Capillary: 136 mg/dL — ABNORMAL HIGH (ref 70–99)

## 2024-05-13 NOTE — Progress Notes (Addendum)
 Progress Note    05/13/2024 6:45 AM 5 Days Post-Op  Subjective:  no complaints this morning.  Still moving all extremities normally.   Tm 100.3 now 99 HR 60's-70's NSR 140's-150's systolic 99% RA  Vitals:   05/12/24 2343 05/13/24 0501  BP: (!) 159/79 (!) 155/83  Pulse: 73 72  Resp: 18 18  Temp: 100.3 F (37.9 C) 99 F (37.2 C)  SpO2: 97% 99%    Physical Exam: General:  no distress Cardiac:  regular Lungs:  non labored Extremities: easily palpable bilateral radial and DP pulses  Abdomen:  soft  CBC    Component Value Date/Time   WBC 12.4 (H) 05/11/2024 1443   RBC 3.15 (L) 05/11/2024 1443   HGB 9.2 (L) 05/11/2024 1443   HGB 12.7 (L) 11/14/2020 1031   HCT 28.0 (L) 05/11/2024 1443   HCT 39.1 11/14/2020 1031   PLT 106 (L) 05/11/2024 1443   PLT 157 11/14/2020 1031   MCV 88.9 05/11/2024 1443   MCV 90 11/14/2020 1031   MCH 29.2 05/11/2024 1443   MCHC 32.9 05/11/2024 1443   RDW 15.8 (H) 05/11/2024 1443   RDW 15.6 (H) 11/14/2020 1031   LYMPHSABS 1.0 03/04/2024 0446   LYMPHSABS 1.0 11/14/2020 1031   MONOABS 1.0 03/04/2024 0446   EOSABS 0.2 03/04/2024 0446   EOSABS 0.3 11/14/2020 1031   BASOSABS 0.0 03/04/2024 0446   BASOSABS 0.1 11/14/2020 1031    BMET    Component Value Date/Time   NA 137 05/13/2024 0321   NA 145 (H) 11/14/2020 1031   K 3.8 05/13/2024 0321   CL 107 05/13/2024 0321   CO2 21 (L) 05/13/2024 0321   GLUCOSE 139 (H) 05/13/2024 0321   BUN 16 05/13/2024 0321   BUN 12 11/14/2020 1031   CREATININE 1.19 05/13/2024 0321   CALCIUM  8.2 (L) 05/13/2024 0321   GFRNONAA >60 05/13/2024 0321   GFRAA >60 05/09/2020 1228    INR    Component Value Date/Time   INR 1.2 05/11/2024 1443     Intake/Output Summary (Last 24 hours) at 05/13/2024 0645 Last data filed at 05/12/2024 1100 Gross per 24 hour  Intake --  Output 500 ml  Net -500 ml      Assessment/Plan:  78 y.o. male is s/p:  1.  Thoracic aortic aneurysm repair with Gore conformable CTAG  40 x 40 x 10 cm 2.  Endovascular repair of juxtarenal aortic aneurysm with main body right 37 x 20 x 160 cm Gore excluder thoracoabdominal branched endoprosthesis extended with 23 x 14 x 16 cm Gore excluder and contralateral left limb 16 x 13.5 cm 3.  Stent of celiac artery with 9 x 59 mm VBX, stent of SMA with 9 x 79 mm VBX, stent left renal artery with two 7 x 79 mm VBX, stent of right renal artery with 7 x 59 and 7 x 79 mm VBX 4.  Open exposure left axillary artery 5.  Percutaneous ultrasound-guided cannulation and proglide closure bilateral common femoral arteries 6.  Intravascular ultrasound of aorta   5 Days Post-Op   -continues to have palpable bilateral radial and DP pulses.  Neuro intact.   -pt with hx of HTN and is on a BB, ARB, hydralazine  (had not been taking) and CCB prior to admission.  His BP has needed to be increased for spinal perfusion.  Will discuss with Dr. Sheree if and when to restart antihypertensives. -DM-metformin was held post surgery due to receiving contrast.  His glucose is running  120's-130's.  Will continue to hold for now.  -Tm 100.3 - needs incentive spirometer and mobilize.  -PT recommending CIR -DVT prophylaxis:  sq heparin    Lucie Apt, PA-C [facility] [phone] 05/13/2024 6:45 AM  I have independently interviewed and examined patient and agree with PA assessment and plan above.   Eldred Lievanos C. Sheree, MD [facility] [place] Office: [phone] Pager: [phone]

## 2024-05-13 NOTE — Progress Notes (Signed)
   Inpatient Rehabilitation Admissions Coordinator   I met with patient, wife and sister in law at bedside. I discussed goals and expectations of a possible Cir admit. They prefer CIR. I will begin Auth with [facility] medicare for possible admit.  Heron Leavell, RN, MSN Rehab Admissions Coordinator [phone] 05/13/2024 2:06 PM

## 2024-05-14 ENCOUNTER — Inpatient Hospital Stay (HOSPITAL_COMMUNITY)

## 2024-05-14 LAB — GLUCOSE, CAPILLARY
Glucose-Capillary: 132 mg/dL — ABNORMAL HIGH (ref 70–99)
Glucose-Capillary: 145 mg/dL — ABNORMAL HIGH (ref 70–99)
Glucose-Capillary: 148 mg/dL — ABNORMAL HIGH (ref 70–99)
Glucose-Capillary: 143 mg/dL — ABNORMAL HIGH (ref 70–99)

## 2024-05-14 MED ORDER — GADOBUTROL 1 MMOL/ML IV SOLN
8.0000 mL | Freq: Once | INTRAVENOUS | Status: AC | PRN
  Administered 2024-05-14: 8 mL via INTRAVENOUS

## 2024-05-14 NOTE — Progress Notes (Signed)
  Progress Note    05/14/2024 7:34 AM 6 Days Post-Op  Subjective:  No overnight issues  Vitals:   05/14/24 0316 05/14/24 0636  BP: (!) 139/93   Pulse: 73 76  Resp: 20 (!) 26  Temp: 99.7 F (37.6 C)   SpO2: 100% 100%    Physical Exam: Awake and alert Moving all extremities although left leg does appear weak Palpable radial pulse in the left and dorsalis pedis pulses bilaterally Bilateral groins soft without hematoma and left axillary incision also soft healing well with Dermabond in place  CBC    Component Value Date/Time   WBC 12.4 (H) 05/11/2024 1443   RBC 3.15 (L) 05/11/2024 1443   HGB 9.2 (L) 05/11/2024 1443   HGB 12.7 (L) 11/14/2020 1031   HCT 28.0 (L) 05/11/2024 1443   HCT 39.1 11/14/2020 1031   PLT 106 (L) 05/11/2024 1443   PLT 157 11/14/2020 1031   MCV 88.9 05/11/2024 1443   MCV 90 11/14/2020 1031   MCH 29.2 05/11/2024 1443   MCHC 32.9 05/11/2024 1443   RDW 15.8 (H) 05/11/2024 1443   RDW 15.6 (H) 11/14/2020 1031   LYMPHSABS 1.0 03/04/2024 0446   LYMPHSABS 1.0 11/14/2020 1031   MONOABS 1.0 03/04/2024 0446   EOSABS 0.2 03/04/2024 0446   EOSABS 0.3 11/14/2020 1031   BASOSABS 0.0 03/04/2024 0446   BASOSABS 0.1 11/14/2020 1031    BMET    Component Value Date/Time   NA 137 05/13/2024 0321   NA 145 (H) 11/14/2020 1031   K 3.8 05/13/2024 0321   CL 107 05/13/2024 0321   CO2 21 (L) 05/13/2024 0321   GLUCOSE 139 (H) 05/13/2024 0321   BUN 16 05/13/2024 0321   BUN 12 11/14/2020 1031   CREATININE 1.19 05/13/2024 0321   CALCIUM  8.2 (L) 05/13/2024 0321   GFRNONAA >60 05/13/2024 0321   GFRAA >60 05/09/2020 1228    INR    Component Value Date/Time   INR 1.2 05/11/2024 1443     Intake/Output Summary (Last 24 hours) at 05/14/2024 0734 Last data filed at 05/13/2024 2221 Gross per 24 hour  Intake --  Output 1300 ml  Net -1300 ml    Assessment/Plan:  78 y.o. male is s/p   1.  Thoracic aortic aneurysm repair with Gore conformable CTAG 40 x 40 x 10  cm 2.  Endovascular repair of juxtarenal aortic aneurysm with main body right 37 x 20 x 160 cm Gore excluder thoracoabdominal branched endoprosthesis extended with 23 x 14 x 16 cm Gore excluder and contralateral left limb 16 x 13.5 cm 3.  Stent of celiac artery with 9 x 59 mm VBX, stent of SMA with 9 x 79 mm VBX, stent left renal artery with two 7 x 79 mm VBX, stent of right renal artery with 7 x 59 and 7 x 79 mm VBX 4.  Open exposure left axillary artery 5.  Percutaneous ultrasound-guided cannulation and proglide closure bilateral common femoral arteries 6.  Intravascular ultrasound of aorta   Plan: Continue mobilization  Pending CIR   [name] [name] C. Sheree, MD [facility] [place] Office: [phone] Pager: [phone]  05/14/2024 7:34 AM

## 2024-05-14 NOTE — Progress Notes (Signed)
 Physical Therapy Treatment Patient Details Name: [name] [name] MRN: [id number] DOB: 17-Aug-1946 Today's Date: 05/14/2024   History of Present Illness 78 y.o. male admitted  9/19 s/p Thoracic aortic aneurysm repair; Open exposure left axillary artery. 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    PT Comments  Pt received in supine, agreeable to therapy session, pt continues to demonstrate recrudescence of his left hemiparetic symptoms this date, including R lean and poor motor coordination UE and LE, RN/MD notified. Typically he can stand and perform ADLs. This date, pt performs step pivot with RW and +2 mod/maxA and needs manual assist for stepping with L leg and R lean sitting and standing. Worked a lot on seated LE ROM and pushing chair around with legs to work on hip/quad strengthening, motor sequencing, and midline postural awareness/correction while pt seated in front of mirror at sink. R lean also while performing seated ADL. Patient will benefit from intensive inpatient follow-up therapy, >3 hours/day.     If plan is discharge home, recommend the following: Two people to help with walking and/or transfers;Two people to help with bathing/dressing/bathroom;Assistance with cooking/housework;Direct supervision/assist for medications management;Direct supervision/assist for financial management;Assist for transportation;Help with stairs or ramp for entrance;Supervision due to cognitive status   Can travel by private vehicle        Equipment Recommendations  None recommended by PT    Recommendations for Other Services       Precautions / Restrictions Precautions Precautions: Fall;Other (comment) Recall of Precautions/Restrictions: Impaired Precaution/Restrictions Comments: incision near L clavicle (axillary artery) and bil  groins Restrictions Weight Bearing Restrictions Per Provider Order: No     Mobility  Bed Mobility Overal bed mobility: Needs Assistance Bed Mobility: Supine to Sit   Sidelying to sit: HOB elevated, Mod assist, +2 for safety/equipment, Used rails       General bed mobility comments: Min to ModA to facilitate/sequence LEs off edge of bed initially. requires (A) of the pad to scoot to EOB and for trunk lifting. Posterior and R truncal instability with transfer to EOB.    Transfers Overall transfer level: Needs assistance Equipment used: Rolling walker (2 wheels) Transfers: Sit to/from Stand, Bed to chair/wheelchair/BSC Sit to Stand: Mod assist, +2 physical assistance, +2 safety/equipment, From elevated surface   Step pivot transfers: Mod assist, +2 physical assistance, +2 safety/equipment       General transfer comment: Mod assist for boost and balance with cues for technique and anterior weight shift. Slow to rise, pt needs assist to control descent into chair as well. Mod assist +2 to facilitate weight shift, advance LLE and block Lt knee with step pivot transfer to recliner on his L side. Max cues for sequencing. Pt does pick up Lt foot but cannot sequence weight shift and clearance enough to advance on his own. He did tolerate gripping RW with UE but needs assist to move RW appropriately. R lean once seated on chair.    Ambulation/Gait                   Stairs             Wheelchair Mobility     Tilt Bed    Modified Rankin (Stroke Patients Only) Modified Rankin (Stroke Patients Only) Pre-Morbid Rankin Score: Moderately severe disability Modified Rankin: Severe disability (no current new imaging in, but increased symptoms post-op)  Balance Overall balance assessment: Needs assistance Sitting-balance support: Single extremity supported, Feet supported Sitting balance-Leahy Scale: Poor   Postural control: Right lateral lean, Posterior lean Standing  balance support: Bilateral upper extremity supported Standing balance-Leahy Scale: Poor Standing balance comment: RW and +2 external assist for dynamic standing tasks                            Communication Communication Communication: No apparent difficulties  Cognition Arousal: Alert Behavior During Therapy: WFL for tasks assessed/performed   PT - Cognitive impairments: No family/caregiver present to determine baseline, Awareness, Memory, Sequencing, Problem solving, Safety/Judgement, History of cognitive impairments                       PT - Cognition Comments: Pt states I feel strange. Pt with R lean throughout session seated/standing but seems unaware. Per pt and family, this is not his baseline. Following commands: Impaired Following commands impaired: Only follows one step commands consistently, Follows one step commands with increased time    Cueing Cueing Techniques: Verbal cues, Gestural cues, Tactile cues  Exercises General Exercises - Lower Extremity Ankle Circles/Pumps: AROM, Both, 10 reps, Supine Long Arc Quad: Strengthening, Both, 10 reps, Seated Hip Flexion/Marching: AROM, Both, 10 reps, Seated (5 reps x2 sets) Other Exercises Other Exercises: stabilized knee extension and hamstring curls sitting in chair (seated wheelchair mobility) x5 reps ea limb ea direction Other Exercises: L lean and elbow propping x3 reps Other Exercises: STS for BLE strengthening    General Comments General comments (skin integrity, edema, etc.): RN/MD notified of increase in pt chronic CVA symptoms      Pertinent Vitals/Pain Pain Assessment Pain Assessment: PAINAD Breathing: normal Negative Vocalization: none Facial Expression: smiling or inexpressive Body Language: relaxed Consolability: no need to console PAINAD Score: 0 Pain Intervention(s): Monitored during session, Repositioned    Home Living                          Prior Function             PT Goals (current goals can now be found in the care plan section) Acute Rehab PT Goals Patient Stated Goal: Get well, go home PT Goal Formulation: With patient Time For Goal Achievement: 05/25/24 Progress towards PT goals: Progressing toward goals    Frequency    Min 2X/week      PT Plan      Co-evaluation PT/OT/SLP Co-Evaluation/Treatment: Yes Reason for Co-Treatment: Complexity of the patient's impairments (multi-system involvement);Necessary to address cognition/behavior during functional activity;For patient/therapist safety;To address functional/ADL transfers PT goals addressed during session: Mobility/safety with mobility;Balance;Proper use of DME;Strengthening/ROM        AM-PAC PT 6 Clicks Mobility   Outcome Measure  Help needed turning from your back to your side while in a flat bed without using bedrails?: A Little Help needed moving from lying on your back to sitting on the side of a flat bed without using bedrails?: A Lot Help needed moving to and from a bed to a chair (including a wheelchair)?: Total Help needed standing up from a chair using your arms (e.g., wheelchair or bedside chair)?: A Lot Help needed to walk in hospital room?: Total Help needed climbing 3-5 steps with a railing? : Total 6 Click Score: 10    End of Session Equipment Utilized During Treatment: Gait belt Activity Tolerance: Patient tolerated treatment well Patient left:  in chair;with call bell/phone within reach;with chair alarm set;Other (comment);with family/visitor present (DIL and spouse arriving) Nurse Communication: Mobility status;Need for lift equipment;Other (comment) (recommend Stedy; MD also notified pt symptoms are more severe than his baseline) PT Visit Diagnosis: Unsteadiness on feet (R26.81);Other abnormalities of gait and mobility (R26.89);Muscle weakness (generalized) (M62.81);Difficulty in walking, not elsewhere classified (R26.2);Other symptoms and signs involving  the nervous system (R29.898);Pain;Hemiplegia and hemiparesis Hemiplegia - Right/Left: Left Hemiplegia - dominant/non-dominant: Non-dominant Hemiplegia - caused by: Unspecified     Time: 8841-8771 PT Time Calculation (min) (ACUTE ONLY): 30 min  Charges:    $Therapeutic Exercise: 8-22 mins PT General Charges $$ ACUTE PT VISIT: 1 Visit                     Shantel Helwig P., PTA Acute Rehabilitation Services Secure Chat Preferred 9a-5:30pm Office: [phone]    Connell HERO [facility] 05/14/2024, 2:34 PM

## 2024-05-14 NOTE — Progress Notes (Signed)
   Inpatient Rehabilitation Admissions Coordinator   I await insurance approval for possible CIR admit.  Heron Leavell, RN, MSN Rehab Admissions Coordinator [phone] 05/14/2024 11:56 AM

## 2024-05-14 NOTE — Progress Notes (Signed)
 Occupational Therapy Treatment Patient Details Name: [name] [name] MRN: [id number] DOB: Dec 27, 1945 Today's Date: 05/14/2024   History of present illness 78 y.o. male admitted  9/19 s/p Thoracic aortic aneurysm repair; Open exposure left axillary artery. 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   OT comments  Pt progressing well towards goals. Progressed to complete grooming at the sink with set up assist. Mod assist and constant cues to facilitate midline while seated, heavy R lateral lean. Pt with good LUE involvement and able to reach across midline. Poor standing balance for functional transfer requiring mod +2 assist to complete stand pivot with RW. Pt continues to be motivated to progress independence. Continue to recommend >3 hours of skilled rehab daily to optimize independence levels. Will continue to follow acutely.      If plan is discharge home, recommend the following:  Two people to help with walking and/or transfers;Two people to help with bathing/dressing/bathroom   Equipment Recommendations  BSC/3in1;Other (comment) (RW)    Recommendations for Other Services Rehab consult    Precautions / Restrictions Precautions Precautions: Fall;Other (comment) Recall of Precautions/Restrictions: Impaired Precaution/Restrictions Comments: incision near L clavicle (axillary artery) and bil groins Restrictions Weight Bearing Restrictions Per Provider Order: No       Mobility Bed Mobility Overal bed mobility: Needs Assistance Bed Mobility: Supine to Sit   Sidelying to sit: HOB elevated, Mod assist, +2 for safety/equipment, Used rails       General bed mobility comments: Mod assist to initate BLEs and trunk    Transfers Overall transfer level: Needs assistance Equipment used: Rolling walker (2 wheels) Transfers: Sit to/from Stand,  Bed to chair/wheelchair/BSC Sit to Stand: Mod assist, +2 physical assistance, +2 safety/equipment, From elevated surface     Step pivot transfers: Mod assist, +2 physical assistance, +2 safety/equipment     General transfer comment: Mod +2 to come to stand once in standing cueing to sequence and facilitate LLE, heavy R lateral lean     Balance Overall balance assessment: Needs assistance Sitting-balance support: Single extremity supported, Feet supported Sitting balance-Leahy Scale: Poor   Postural control: Right lateral lean, Posterior lean Standing balance support: Bilateral upper extremity supported Standing balance-Leahy Scale: Poor Standing balance comment: RW and +2 external assist for dynamic standing tasks         ADL either performed or assessed with clinical judgement   ADL Overall ADL's : Needs assistance/impaired     Grooming: Oral care;Sitting;Set up Grooming Details (indicate cue type and reason): Performed at sink to encourage midline     Toilet Transfer: Moderate assistance;+2 for physical assistance;+2 for safety/equipment;Stand-pivot;Rolling walker (2 wheels) Toilet Transfer Details (indicate cue type and reason): Short step pivot simulated in room         Functional mobility during ADLs: Moderate assistance;+2 for physical assistance;+2 for safety/equipment;Rolling walker (2 wheels) General ADL Comments: Pt with heavy R lateral lean in sitting and standing, needing constant cueing to correct    Extremity/Trunk Assessment Upper Extremity Assessment Upper Extremity Assessment: Generalized weakness (Decreased FMC LUE)   Lower Extremity Assessment Lower Extremity Assessment: Defer to PT evaluation        Vision   Vision Assessment?: No apparent visual deficits         Communication Communication Communication: No apparent difficulties   Cognition Arousal: Alert Behavior During Therapy: WFL for tasks assessed/performed Cognition: History of  cognitive impairments  OT - Cognition Comments: Pt with history of dementia but reporting feeling more confused than baseline       Following commands: Impaired Following commands impaired: Only follows one step commands consistently, Follows one step commands with increased time      Cueing   Cueing Techniques: Verbal cues, Gestural cues, Tactile cues           Frequency  Min 2X/week        Progress Toward Goals  OT Goals(current goals can now be found in the care plan section)  Progress towards OT goals: Progressing toward goals  Acute Rehab OT Goals Patient Stated Goal: To get better OT Goal Formulation: With patient Time For Goal Achievement: 05/26/24 Potential to Achieve Goals: Good ADL Goals Pt Will Perform Grooming: with set-up;sitting Pt Will Perform Upper Body Bathing: with set-up;sitting Pt Will Transfer to Toilet: with mod assist;stand pivot transfer;bedside commode Additional ADL Goal #1: pt will complete bed mobility min (A) as precursor to adls.  Plan      Co-evaluation      Reason for Co-Treatment: Complexity of the patient's impairments (multi-system involvement);Necessary to address cognition/behavior during functional activity;For patient/therapist safety;To address functional/ADL transfers PT goals addressed during session: Mobility/safety with mobility;Balance;Proper use of DME;Strengthening/ROM OT goals addressed during session: ADL's and self-care      AM-PAC OT 6 Clicks Daily Activity     Outcome Measure   Help from another Bartley eating meals?: A Little Help from another [place]in taking care of personal grooming?: A Little Help from another [place] toileting, which includes using toliet, bedpan, or urinal?: A Lot Help from another [place] bathing (including washing, rinsing, drying)?: A Lot Help from another Ramires to put on and taking off regular upper body clothing?: A Lot Help from another Corron to put on and taking off  regular lower body clothing?: A Lot 6 Click Score: 14    End of Session Equipment Utilized During Treatment: Gait belt;Rolling walker (2 wheels)  OT Visit Diagnosis: Unsteadiness on feet (R26.81);Muscle weakness (generalized) (M62.81)   Activity Tolerance Patient tolerated treatment well   Patient Left in chair;with call bell/phone within reach;with chair alarm set;with family/visitor present   Nurse Communication Mobility status;Precautions        Time: 8840-8772 OT Time Calculation (min): 28 min  Charges: OT General Charges $OT Visit: 1 Visit OT Treatments $Self Care/Home Management : 8-22 mins  Adrianne BROCKS, OT  Acute Rehabilitation Services Office [phone] Secure chat preferred   Adrianne GORMAN Savers 05/14/2024, 5:09 PM

## 2024-05-15 ENCOUNTER — Inpatient Hospital Stay (HOSPITAL_COMMUNITY)

## 2024-05-15 DIAGNOSIS — R531 Weakness: Secondary | ICD-10-CM

## 2024-05-15 DIAGNOSIS — R29706 NIHSS score 6: Secondary | ICD-10-CM

## 2024-05-15 DIAGNOSIS — I63549 Cerebral infarction due to unspecified occlusion or stenosis of unspecified cerebellar artery: Secondary | ICD-10-CM | POA: Diagnosis not present

## 2024-05-15 LAB — GLUCOSE, CAPILLARY
Glucose-Capillary: 126 mg/dL — ABNORMAL HIGH (ref 70–99)
Glucose-Capillary: 160 mg/dL — ABNORMAL HIGH (ref 70–99)
Glucose-Capillary: 116 mg/dL — ABNORMAL HIGH (ref 70–99)
Glucose-Capillary: 114 mg/dL — ABNORMAL HIGH (ref 70–99)

## 2024-05-15 MED ORDER — STROKE: EARLY STAGES OF RECOVERY BOOK
Freq: Once | Status: AC
  Filled 2024-05-15: qty 1

## 2024-05-15 NOTE — Consult Note (Signed)
 NEUROLOGY CONSULT NOTE   Date of service: May 15, 2024 Patient Name: [name] [name] MRN:  [id number] DOB:  Jan 25, 1946 Chief Complaint: [name] [name] Requesting Provider: Sheree Penne Millman*  History of Present Illness  [name] [name] is a 78 y.o. male with history of hyperlipidemia, hypertension, previous stroke, diabetes who was admitted for thoracic aortic aneurysm repair.  Following surgery, was noticed that he was dragging his left leg and an MRI was obtained which demonstrates a left cerebellar infarct.  He was admitted in July with bilateral lower extremity weakness.  Which is felt to be possibly due to general medical condition.  His weakness now, however is significantly worse.  LKW: 9/19 prior to surgery IV Thrombolysis: No recent surgery  EVT: No signs of LVO  NIHSS components Score: Comment  1a Level of Conscious 0[x]  1[]  2[]  3[]      1b LOC Questions 0[x]  1[]  2[]       1c LOC Commands 0[x]  1[]  2[]       2 Best Gaze 0[x]  1[]  2[]       3 Visual 0[x]  1[]  2[]  3[]      4 Facial Palsy 0[x]  1[]  2[]  3[]      5a Motor Arm - left 0[]  1[x]  2[]  3[]  4[]  UN[]    5b Motor Arm - Right 0[]  1[x]  2[]  3[]  4[]  UN[]    6a Motor Leg - Left 0[]  1[]  2[x]  3[]  4[]  UN[]    6b Motor Leg - Right 0[]  1[]  2[x]  3[]  4[]  UN[]    7 Limb Ataxia 0[x]  1[]  2[]  UN[]      8 Sensory 0[x]  1[]  2[]  UN[]      9 Best Language 0[x]  1[]  2[]  3[]      10 Dysarthria 0[x]  1[]  2[]  UN[]      11 Extinct. and Inattention 0[x]  1[]  2[]       TOTAL: 6       Past History   Past Medical History:  Diagnosis Date   Chronic back pain    stenosis   Diabetes mellitus without complication (HCC)    Enlarged prostate    GERD (gastroesophageal reflux disease)    occasionally will take a zantac(maybe once a month)   History of colon polyps    History of kidney stones    History of stress test    done 10 yrs. ago, as a baseline    Hyperlipidemia    takes Crestor  daily   Hypertension    takes Amlodipine  daily and Lotensin  as  well   Stroke [facility])     Past Surgical History:  Procedure Laterality Date   ANTERIOR LAT LUMBAR FUSION Left 12/15/2014   Procedure: ANTERIOR LATERAL LUMBAR FUSION 1 LEVEL;  Surgeon: Oneil Priestly, MD;  Location: [facility] OR;  Service: Orthopedics;  Laterality: Left;  Left sided lateral lumbar interbody fusion, lumbar 3-4, posterior spinal fusion, lumbar 3-4 with instrumentation.   COLONOSCOPY     ESOPHAGOGASTRODUODENOSCOPY     fatty tissue removed from stomach     LUMBAR LAMINECTOMY/DECOMPRESSION MICRODISCECTOMY N/A 07/08/2013   Procedure: LUMBAR LAMINECTOMY/DECOMPRESSION MICRODISCECTOMY;  Surgeon: Oneil Rodgers Priestly, MD;  Location[facility] OR;  Service: Orthopedics;  Laterality: N/A;  Lumbar 3-4, lumbar 4-5 decompression   RADIOLOGY WITH ANESTHESIA N/A 10/22/2019   Procedure: MRI WITH ANESTHESIA OF LUMBAR SPINE WITHOUT CONTRAST;  Surgeon: Radiologist, Medication, MD;  Location: [facility] OR;  Service: Radiology;  Laterality: N/A;   right ankle surgery     as child    THORACIC AORTIC ENDOVASCULAR STENT GRAFT N/A 10/15/2019   Procedure: THORACIC AORTIC ENDOVASCULAR STENT GRAFT;  Surgeon: Sheree Penne Bruckner, MD;  Location: [facility] OR;  Service: Vascular;  Laterality: N/A;   THORACOABDOMINAL AORTIC ANEURYSM REPAIR Bilateral 05/08/2024   Procedure: REPAIR, ANEURYSM, AORTA, THORACOABDOMINAL;  Surgeon: Sheree Penne Bruckner, MD;  Location: [facility] OR;  Service: Vascular;  Laterality: Bilateral;   TONSILLECTOMY     as a child   ULTRASOUND GUIDANCE FOR VASCULAR ACCESS Bilateral 05/08/2024   Procedure: ULTRASOUND GUIDANCE, FOR VASCULAR ACCESS;  Surgeon: Sheree Penne Bruckner, MD;  Location: [facility] OR;  Service: Vascular;  Laterality: Bilateral;    Family History: History reviewed. No pertinent family history.  Social History  reports that he has quit smoking. His smoking use included cigarettes. He has a 10 pack-year smoking history. He has never used smokeless tobacco. He reports that he does not drink alcohol  and does not use drugs.  Allergies  Allergen Reactions   Lyrica [Pregabalin] Nausea Only and Other (See Comments)    Hallucinations Dizziness    Sulfa Antibiotics Other (See Comments)    Unknown reaction    Medications   Current Facility-Administered Medications:    0.9 %  sodium chloride  infusion, 10 mL/hr, Intravenous, Once, Eveland, Matthew, PA-C   acetaminophen  (TYLENOL ) tablet 325-650 mg, 325-650 mg, Oral, Q4H PRN, 650 mg at 05/10/24 2257 **OR** acetaminophen  (TYLENOL ) suppository 325-650 mg, 325-650 mg, Rectal, Q4H PRN, Eveland, Matthew, PA-C   aspirin  EC tablet 81 mg, 81 mg, Oral, Daily, Eveland, Matthew, PA-C, 81 mg at 05/15/24 0944   bisacodyl  (DULCOLAX) suppository 10 mg, 10 mg, Rectal, Daily PRN, Eveland, Matthew, PA-C   docusate sodium  (COLACE) capsule 100 mg, 100 mg, Oral, Daily, Eveland, Matthew, PA-C, 100 mg at 05/15/24 0944   heparin  injection 5,000 Units, 5,000 Units, Subcutaneous, Q8H, Magda Debby SAILOR, MD, 5,000 Units at 05/15/24 1248   HYDROmorphone  (DILAUDID ) injection 0.5 mg, 0.5 mg, Intravenous, Q3H PRN, Eveland, Matthew, PA-C, 0.5 mg at 05/08/24 2005   insulin  aspart (novoLOG ) injection 0-9 Units, 0-9 Units, Subcutaneous, TID WC, Eveland, Matthew, PA-C, 2 Units at 05/15/24 1247   memantine  (NAMENDA ) tablet 10 mg, 10 mg, Oral, BID, Eveland, Matthew, PA-C, 10 mg at 05/15/24 0943   metoprolol  tartrate (LOPRESSOR ) injection 2.5-5 mg, 2.5-5 mg, Intravenous, Q2H PRN, Eveland, Matthew, PA-C   multivitamin with minerals tablet 1 tablet, 1 tablet, Oral, Daily, Eveland, Matthew, PA-C, 1 tablet at 05/15/24 9055   Oral care mouth rinse, 15 mL, Mouth Rinse, PRN, Eveland, Matthew, PA-C   oxyCODONE -acetaminophen  (PERCOCET/ROXICET) 5-325 MG per tablet 1-2 tablet, 1-2 tablet, Oral, Q4H PRN, Eveland, Matthew, PA-C, 1 tablet at 05/09/24 2324   pantoprazole  (PROTONIX ) EC tablet 40 mg, 40 mg, Oral, Daily, Eveland, Matthew, PA-C, 40 mg at 05/15/24 0943   phenol (CHLORASEPTIC) mouth  spray 1 spray, 1 spray, Mouth/Throat, PRN, Eveland, Matthew, PA-C   polyethylene glycol (MIRALAX  / GLYCOLAX ) packet 17 g, 17 g, Oral, Daily PRN, Bethanie Cough, PA-C, 17 g at 05/15/24 0944   potassium chloride  SA (KLOR-CON  M) CR tablet 40-60 mEq, 40-60 mEq, Oral, Daily PRN, Bethanie Cough, PA-C, 40 mEq at 05/10/24 9471   rosuvastatin  (CRESTOR ) tablet 10 mg, 10 mg, Oral, QHS, Eveland, Matthew, PA-C, 10 mg at 05/14/24 2052   tamsulosin  (FLOMAX ) capsule 0.4 mg, 0.4 mg, Oral, Daily, Bethanie Cough, PA-C, 0.4 mg at 05/15/24 0944  Vitals   Vitals:   05/15/24 0825 05/15/24 1147 05/15/24 1703 05/15/24 1929  BP: (!) 160/81 (!) 161/84 (!) 152/91 (!) 144/77  Pulse: 65 65 71 69  Resp: 20 20 20 18   Temp: 98 F (36.7  C) 98 F (36.7 C)  99.5 F (37.5 C)  TempSrc: Oral Oral Oral Oral  SpO2: 98% 100% 99% 99%  Weight:      Height:        Body mass index is 26.38 kg/m.   Physical Exam   Constitutional: Appears well-developed and well-nourished.   Neurologic Examination    Neuro: Mental Status: Patient is awake, alert, oriented to [place], place, month, year, and situation, though he does have some delay when he is thinking of answers Patient is able to give a clear and coherent history. No signs of aphasia or neglect Cranial Nerves: II: Visual Fields are full. Pupils are equal, round, and reactive to light.   III,IV, VI: He has exotropia, though each individual eye V: Facial sensation is symmetric to temperature VII: Facial movement is symmetric.  VIII: hearing is intact to voice X: Uvula elevates symmetrically XII: tongue is midline without atrophy or fasciculations.  Motor: He has distal greater than proximal weakness of the lower extremities bilaterally, left > right, he also has weakness of his upper extremities also left greater than right Sensory: Symmetric to light touch and temperature Deep Tendon Reflexes: 2+ and symmetric in the biceps and patellae.  Absent at the  ankles Cerebellar: FNF without clear ataxia        Labs/Imaging/Neurodiagnostic studies   CBC:  Recent Labs  Lab 05/23/2024 0235 05-23-2024 1443  WBC 11.4* 12.4*  HGB 8.5* 9.2*  HCT 25.7* 28.0*  MCV 87.7 88.9  PLT 95* 106*   Basic Metabolic Panel:  Lab Results  Component Value Date   NA 137 05/13/2024   K 3.8 05/13/2024   CO2 21 (L) 05/13/2024   GLUCOSE 139 (H) 05/13/2024   BUN 16 05/13/2024   CREATININE 1.19 05/13/2024   CALCIUM  8.2 (L) 05/13/2024   GFRNONAA >60 05/13/2024   GFRAA >60 05/09/2020   Lipid Panel:  Lab Results  Component Value Date   LDLCALC 56 11/14/2020   HgbA1c:  Lab Results  Component Value Date   HGBA1C 5.8 (H) 05/06/2024   Urine Drug Screen:     Component Value Date/Time   LABOPIA NONE DETECTED 03/01/2024 0545   COCAINSCRNUR NONE DETECTED 03/01/2024 0545   LABBENZ NONE DETECTED 03/01/2024 0545   AMPHETMU NONE DETECTED 03/01/2024 0545   THCU NONE DETECTED 03/01/2024 0545   LABBARB NONE DETECTED 03/01/2024 0545    Alcohol Level No results found for: [facility] INR  Lab Results  Component Value Date   INR 1.2 2024-05-23   APTT  Lab Results  Component Value Date   APTT 34 05/10/2024    MRI Brain(Personally reviewed): Cerebellar infarct  ASSESSMENT   Toure Edmonds Bubel is a 78 y.o. male with generalized weakness, more pronounced on the left in the setting of cerebellar infarct.  I do not think a cerebellar infarct is responsible for his weakness, and I do think that he needs spinal imaging.  He has had weakness since at least July, but apparently is somewhat worse now.  RECOMMENDATIONS  MRI C-spine and T-spine MRA head and neck Continue antiplatelet therapy with ASA 81 mg Echo, telemetry Lipids(recent A1c 5.8) PT, OT, ST Stroke team to follow ______________________________________________________________________    Signed, Aisha Seals, MD [facility]

## 2024-05-15 NOTE — Evaluation (Addendum)
 Physical Therapy Re-Evaluation Patient Details Name: [name] [name] MRN: [id number] DOB: 1946-05-31 Today's Date: 05/15/2024  History of Present Illness  78 y.o. male admitted  9/19 s/p Thoracic aortic aneurysm repair; Open exposure left axillary artery. on 9/25 functional status change, MRI brain shows acute ischemic nonhemorrhagic R cerebellar infarct. 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  Clinical Impression   Pt re-evaluated in setting of acute CVA. Pt presents with impaired balance, poor postural control in sitting and standing, mild R incoordination, max difficulty mobilizing, and decreased activity tolerance. Pt to benefit from acute PT to address deficits. Pt endorsing feeling much weaker vs baseline, pt with heavy posterior and R lateral bias in sitting requiring max facilitation and cuing to correct. Pt with limited standing tolerance, and overall requiring max +1 assist for transfer-level mobility this date. Patient will benefit from intensive inpatient follow-up therapy, >3 hours/day. PT to progress mobility as tolerated, and will continue to follow acutely.          If plan is discharge home, recommend the following: Two people to help with walking and/or transfers;Two people to help with bathing/dressing/bathroom;Assistance with cooking/housework;Direct supervision/assist for medications management;Direct supervision/assist for financial management;Assist for transportation;Help with stairs or ramp for entrance;Supervision due to cognitive status   Can travel by private vehicle        Equipment Recommendations None recommended by PT  Recommendations for Other Services       Functional Status Assessment Patient has had a recent decline in their functional status and demonstrates the ability to make significant improvements in  function in a reasonable and predictable amount of time.     Precautions / Restrictions Precautions Precautions: Fall Recall of Precautions/Restrictions: Impaired Precaution/Restrictions Comments: incision near L clavicle (axillary artery) and bil groins Restrictions Weight Bearing Restrictions Per Provider Order: No      Mobility  Bed Mobility Overal bed mobility: Needs Assistance Bed Mobility: Supine to Sit     Supine to sit: Mod assist, Used rails, HOB elevated     General bed mobility comments: assist for LE progression to EOB, trunk elevation, scooting with bed pad to EOB. posterior bias.    Transfers Overall transfer level: Needs assistance Equipment used: Rolling walker (2 wheels) Transfers: Sit to/from Stand, Bed to chair/wheelchair/BSC Sit to Stand: Max assist           General transfer comment: max assist for power up, rise, steadying. stand x3, from EOB x2 and from recliner x1. squat pivot to recliner with max assist all aspects    Ambulation/Gait               General Gait Details: unable  Stairs            Wheelchair Mobility     Tilt Bed    Modified Rankin (Stroke Patients Only) Modified Rankin (Stroke Patients Only) Pre-Morbid Rankin Score: Moderately severe disability Modified Rankin: Severe disability     Balance Overall balance assessment: Needs assistance Sitting-balance support: Single extremity supported, Feet supported Sitting balance-Leahy Scale: Poor Sitting balance - Comments: posterior bias with tendency to prop on R elbow Postural control: Right lateral lean, Posterior lean Standing balance support: Bilateral upper extremity supported Standing balance-Leahy Scale: Poor Standing balance comment: RW and max assist for standing tasks  Pertinent Vitals/Pain Pain Assessment Pain Assessment: No/denies pain Pain Intervention(s): Monitored during session    Home Living  Family/patient expects to be discharged to:: Private residence Living Arrangements: Spouse/significant other Available Help at Discharge: Family;Available 24 hours/day Type of Home: House Home Access: Stairs to enter Entrance Stairs-Rails: None Entrance Stairs-Number of Steps: 1 Alternate Level Stairs-Number of Steps: 7+7 with landing Home Layout: Two level;Bed/bath upstairs Home Equipment: Tub bench;Rolling Walker (2 wheels);Rollator (4 wheels);Hand held shower head;BSC/3in1;Grab bars - toilet;Wheelchair - manual Additional Comments: Enjoys watching TV and walking outside on his driveway    Prior Function Prior Level of Function : Independent/Modified Independent             Mobility Comments: rollator to ambulate ADLs Comments: ind, plays piano     Extremity/Trunk Assessment   Upper Extremity Assessment Upper Extremity Assessment: Defer to OT evaluation    Lower Extremity Assessment Lower Extremity Assessment: Generalized weakness;RLE deficits/detail;LLE deficits/detail RLE Deficits / Details: mild incoordination R heel-to-shin with increased time; at least 3/5 knee extension/flexion RLE Coordination: decreased gross motor;decreased fine motor LLE Deficits / Details: history of CVA, at least 3/5 knee extension/flexion    Cervical / Trunk Assessment Cervical / Trunk Assessment: Kyphotic  Communication   Communication Communication: No apparent difficulties Factors Affecting Communication:  (slowed speech, unsure of pt baseline)    Cognition Arousal: Alert Behavior During Therapy: WFL for tasks assessed/performed   PT - Cognitive impairments: No family/caregiver present to determine baseline, Memory, Sequencing, Problem solving, Safety/Judgement, History of cognitive impairments, Awareness                         Following commands: Impaired Following commands impaired: Follows one step commands with increased time     Cueing Cueing Techniques: Verbal  cues, Gestural cues, Tactile cues     General Comments      Exercises     Assessment/Plan    PT Assessment Patient needs continued PT services  PT Problem List Decreased strength;Decreased range of motion;Decreased activity tolerance;Decreased balance;Decreased mobility;Decreased coordination;Decreased cognition;Decreased knowledge of use of DME;Decreased knowledge of precautions;Pain       PT Treatment Interventions DME instruction;Gait training;Stair training;Functional mobility training;Therapeutic activities;Therapeutic exercise;Balance training;Neuromuscular re-education;Patient/family education;Wheelchair mobility training    PT Goals (Current goals can be found in the Care Plan section)  Acute Rehab PT Goals Patient Stated Goal: Get well, go home PT Goal Formulation: With patient/family Time For Goal Achievement: 05/25/24 Potential to Achieve Goals: Fair    Frequency Min 2X/week     Co-evaluation               AM-PAC PT 6 Clicks Mobility  Outcome Measure Help needed turning from your back to your side while in a flat bed without using bedrails?: A Lot Help needed moving from lying on your back to sitting on the side of a flat bed without using bedrails?: A Lot Help needed moving to and from a bed to a chair (including a wheelchair)?: Total Help needed standing up from a chair using your arms (e.g., wheelchair or bedside chair)?: Total Help needed to walk in hospital room?: Total Help needed climbing 3-5 steps with a railing? : Total 6 Click Score: 8    End of Session Equipment Utilized During Treatment: Gait belt Activity Tolerance: Patient tolerated treatment well;Patient limited by fatigue Patient left: in chair;with call bell/phone within reach;with chair alarm set;with family/visitor present Nurse Communication: Mobility status;Need for lift equipment (stedy for back to bed)  PT Visit Diagnosis: Unsteadiness on feet (R26.81);Muscle weakness (generalized)  (M62.81);Pain;Hemiplegia and hemiparesis Hemiplegia - Right/Left: Left Hemiplegia - dominant/non-dominant: Non-dominant Hemiplegia - caused by: Cerebral infarction    Time: 1300-1335 PT Time Calculation (min) (ACUTE ONLY): 35 min   Charges:   PT Evaluation $PT Re-evaluation: 1 Re-eval PT Treatments $Therapeutic Activity: 8-22 mins PT General Charges $$ ACUTE PT VISIT: 1 Visit         Johana RAMAN, PT DPT Acute Rehabilitation Services Secure Chat Preferred  Office [phone]   Maanasa Aderhold FORBES [place] 05/15/2024, 4:26 PM

## 2024-05-15 NOTE — Plan of Care (Signed)

## 2024-05-15 NOTE — Progress Notes (Signed)
 Inpatient Rehab Admissions Coordinator:   Awaiting determination from [facility] Medicare regarding CIR prior auth request.  Note new CVA identified on imaging yesterday.  Per report, pt requiring more assist with therapy yesterday, but still mobilizing well.  Neurology input pending.  Expect will be ready within the next 24-48 hours depending on their recommendations.  We will continue to follow.   Reche Lowers, PT, DPT Admissions Coordinator [phone] 05/15/24  11:57 AM

## 2024-05-15 NOTE — H&P (Signed)
 PLEASE SEE H&P in rehab encounter   CC: Acute right cerebellar infarct  HPI:  [name] [name] is a 78 year old male with a PMH of CVA, HTN, HLD, GERD, and diabetes, who is 9 days postop from thoracic aortic aneurysm repair. 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 He is doing well without pain or worsening weakness. Incision site is c/d/I. He is stable for admission to CIR today   ROS +left >right sided lower extremity weakness, +blurry vision   Past Medical History:  Diagnosis Date   Chronic back pain    stenosis   Diabetes mellitus without complication (HCC)    Enlarged prostate    GERD (gastroesophageal reflux disease)    occasionally will take a zantac(maybe once a month)   History of colon polyps    History of kidney stones    History of stress test    done 10 yrs. ago, as a baseline    Hyperlipidemia    takes Crestor  daily   Hypertension    takes Amlodipine  daily and Lotensin  as well   Stroke [facility])     Past Surgical History:  Procedure Laterality Date   ANTERIOR LAT LUMBAR FUSION Left 12/15/2014   Procedure: ANTERIOR LATERAL LUMBAR FUSION 1 LEVEL;  Surgeon: Oneil Priestly, MD;  Location: [facility] OR;  Service: Orthopedics;  Laterality: Left;  Left sided lateral lumbar interbody fusion, lumbar 3-4, posterior spinal fusion, lumbar 3-4 with instrumentation.   COLONOSCOPY     ESOPHAGOGASTRODUODENOSCOPY     fatty tissue removed from stomach     LUMBAR LAMINECTOMY/DECOMPRESSION MICRODISCECTOMY N/A 07/08/2013   Procedure: LUMBAR LAMINECTOMY/DECOMPRESSION MICRODISCECTOMY;  Surgeon: Oneil Rodgers Priestly, MD;  Location: [facility] OR;  Service: Orthopedics;  Laterality: N/A;  Lumbar 3-4, lumbar 4-5 decompression   RADIOLOGY WITH ANESTHESIA N/A 10/22/2019   Procedure: MRI WITH ANESTHESIA OF LUMBAR SPINE WITHOUT CONTRAST;  Surgeon: Radiologist, Medication, MD;  Location: [facility] OR;  Service: Radiology;  Laterality: N/A;   right ankle  surgery     as child    THORACIC AORTIC ENDOVASCULAR STENT GRAFT N/A 10/15/2019   Procedure: THORACIC AORTIC ENDOVASCULAR STENT GRAFT;  Surgeon: Sheree Penne Bruckner, MD;  Location: [facility] OR;  Service: Vascular;  Laterality: N/A;   THORACOABDOMINAL AORTIC ANEURYSM REPAIR Bilateral 05/08/2024   Procedure: REPAIR, ANEURYSM, AORTA, THORACOABDOMINAL;  Surgeon: Sheree Penne Bruckner, MD;  Location: [facility] OR;  Service: Vascular;  Laterality: Bilateral;   TONSILLECTOMY     as a child   ULTRASOUND GUIDANCE FOR VASCULAR ACCESS Bilateral 05/08/2024   Procedure: ULTRASOUND GUIDANCE, FOR VASCULAR ACCESS;  Surgeon: Sheree Penne Bruckner, MD;  Location: [facility] OR;  Service: Vascular;  Laterality: Bilateral;    History reviewed. No pertinent family history.   Social History:  reports that he has quit smoking. His smoking use included cigarettes. He has a 10 pack-year smoking history. He has never used smokeless tobacco. He reports that he does not drink alcohol and does not use drugs. Allergies:  Allergies  Allergen Reactions   Lyrica [Pregabalin] Nausea Only and Other (See Comments)    Hallucinations Dizziness    Sulfa Antibiotics Other (See Comments)    Unknown reaction    Medications Prior to Admission  Medication Sig Dispense Refill   acetaminophen  (TYLENOL ) 500 MG tablet Take 500 mg by mouth 2 (two) times daily.     amLODipine  (NORVASC ) 10 MG tablet Take 10 mg by mouth daily.     aspirin  EC  81 MG tablet Take 81 mg by mouth daily.     carvedilol  (COREG ) 25 MG tablet Take 1 tablet (25 mg total) by mouth 2 (two) times daily with a meal. 60 tablet 0   losartan (COZAAR) 50 MG tablet Take 50 mg by mouth daily.     memantine  (NAMENDA ) 10 MG tablet TAKE 1 TABLET BY MOUTH TWICE DAILY 60 tablet 10   metFORMIN (GLUCOPHAGE) 500 MG tablet Take 500 mg by mouth 2 (two) times daily.     Multiple Vitamins-Minerals (MENS 50+ MULTIVITAMIN) TABS Take 1 tablet by mouth daily.     rosuvastatin  (CRESTOR ) 10 MG  tablet Take 10 mg by mouth at bedtime.     tamsulosin  (FLOMAX ) 0.4 MG CAPS capsule Take 1 capsule (0.4 mg total) by mouth daily. 30 capsule 0   hydrALAZINE  (APRESOLINE ) 10 MG tablet Take 1 tablet (10 mg total) by mouth 3 (three) times daily. (Patient not taking: Reported on 05/06/2024) 180 tablet 0   pantoprazole  (PROTONIX ) 40 MG tablet Take 1 tablet (40 mg total) by mouth daily. (Patient not taking: Reported on 05/06/2024) 30 tablet 0    Home: Home Living Family/patient expects to be discharged to:: Private residence Living Arrangements: Spouse/significant other Available Help at Discharge: Family, Available 24 hours/day Type of Home: House Home Access: Stairs to enter [employer] of Steps: 1 Entrance Stairs-Rails: None Home Layout: Two level, Bed/bath upstairs Alternate Level Stairs-Number of Steps: 7+7 with landing Alternate Level Stairs-Rails: Right Bathroom Shower/Tub: Walk-in shower, Tub/shower unit (walk in shower upstairs) Bathroom Toilet: Handicapped height Bathroom Accessibility: Yes Home Equipment: Tub bench, Agricultural consultant (2 wheels), Rollator (4 wheels), Hand held shower head, BSC/3in1, Grab bars - toilet, Wheelchair - manual Additional Comments: Enjoys watching TV and walking outside on his driveway  Lives With: Spouse   Functional History: Prior Function Prior Level of Function : Independent/Modified Independent Mobility Comments: rollator to ambulate ADLs Comments: ind, plays piano  Functional Status:  Mobility: Bed Mobility Overal bed mobility: Needs Assistance Bed Mobility: Supine to Sit Rolling: Min assist Sidelying to sit: HOB elevated, Mod assist, +2 for safety/equipment, Used rails Supine to sit: Mod assist, Used rails, HOB elevated General bed mobility comments: assist for LE progression to EOB, trunk elevation, scooting with bed pad to EOB. posterior bias. Transfers Overall transfer level: Needs assistance Equipment used: Rolling walker (2  wheels) Transfers: Sit to/from Stand, Bed to chair/wheelchair/BSC Sit to Stand: Max assist Bed to/from chair/wheelchair/BSC transfer type:: Step pivot Step pivot transfers: Mod assist, +2 physical assistance, +2 safety/equipment General transfer comment: max assist for power up, rise, steadying. stand x3, from EOB x2 and from recliner x1. squat pivot to recliner with max assist all aspects Ambulation/Gait General Gait Details: unable Pre-gait activities: Progressed with forward and retrostep. Poor quad control noted Lt , resulting in buckling and hyperextension unless blocked by therapist. He was able to march in place with heavy UE reliance on RW, and bracing on Lt side from therapist.    ADL: ADL Overall ADL's : Needs assistance/impaired Eating/Feeding: Set up, Sitting Eating/Feeding Details (indicate cue type and reason): cues to initiate Grooming: Oral care, Sitting, Set up Grooming Details (indicate cue type and reason): Performed at sink to encourage midline Upper Body Bathing: Moderate assistance Toilet Transfer: Moderate assistance, +2 for physical assistance, +2 for safety/equipment, Stand-pivot, Rolling walker (2 wheels) Toilet Transfer Details (indicate cue type and reason): Short step pivot simulated in room Functional mobility during ADLs: Moderate assistance, +2 for physical assistance, +2 for safety/equipment,  Rolling walker (2 wheels) General ADL Comments: Pt with heavy R lateral lean in sitting and standing, needing constant cueing to correct  Cognition: Cognition Orientation Level: Oriented X4 Cognition Arousal: Alert Behavior During Therapy: WFL for tasks assessed/performed   Blood pressure (!) 152/91, pulse 71, temperature 98 F (36.7 C), temperature source Oral, resp. rate 20, height 5' 11 (1.803 m), weight 85.8 kg, SpO2 99%. Physical Exam Gen: no distress, normal appearing HEENT: oral mucosa pink and moist, NCAT Cardio: Reg rate Chest: normal effort,  normal rate of breathing Abd: soft, non-distended Ext: no edema Psych: pleasant, normal affect Skin: intact Neuro: Alert and oriented x3, closes right eye for comfort due to blurry vision, has disconjugate gaze, 3/5 strength in LLE, strength is otherwise 4/5 throughout, sensation is intact   Results for orders placed or performed during the hospital encounter of 05/08/24 (from the past 48 hours)  Glucose, capillary     Status: Abnormal   Collection Time: 05/13/24  9:25 PM  Result Value Ref Range   Glucose-Capillary 136 (H) 70 - 99 mg/dL    Comment: Glucose reference range applies only to samples taken after fasting for at least 8 hours.   Comment 1 Notify RN    Comment 2 Document in Chart   Glucose, capillary     Status: Abnormal   Collection Time: 05/14/24  6:38 AM  Result Value Ref Range   Glucose-Capillary 132 (H) 70 - 99 mg/dL    Comment: Glucose reference range applies only to samples taken after fasting for at least 8 hours.   Comment 1 Notify RN    Comment 2 Document in Chart   Glucose, capillary     Status: Abnormal   Collection Time: 05/14/24 11:48 AM  Result Value Ref Range   Glucose-Capillary 145 (H) 70 - 99 mg/dL    Comment: Glucose reference range applies only to samples taken after fasting for at least 8 hours.   Comment 1 Notify RN    Comment 2 Document in Chart   Glucose, capillary     Status: Abnormal   Collection Time: 05/14/24  5:27 PM  Result Value Ref Range   Glucose-Capillary 148 (H) 70 - 99 mg/dL    Comment: Glucose reference range applies only to samples taken after fasting for at least 8 hours.   Comment 1 Notify RN    Comment 2 Document in Chart   Glucose, capillary     Status: Abnormal   Collection Time: 05/14/24  9:09 PM  Result Value Ref Range   Glucose-Capillary 143 (H) 70 - 99 mg/dL    Comment: Glucose reference range applies only to samples taken after fasting for at least 8 hours.   Comment 1 Notify RN    Comment 2 Document in Chart    Glucose, capillary     Status: Abnormal   Collection Time: 05/15/24  6:19 AM  Result Value Ref Range   Glucose-Capillary 126 (H) 70 - 99 mg/dL    Comment: Glucose reference range applies only to samples taken after fasting for at least 8 hours.   Comment 1 Notify RN    Comment 2 Document in Chart   Glucose, capillary     Status: Abnormal   Collection Time: 05/15/24 11:44 AM  Result Value Ref Range   Glucose-Capillary 160 (H) 70 - 99 mg/dL    Comment: Glucose reference range applies only to samples taken after fasting for at least 8 hours.   Comment 1 Notify RN  Comment 2 Document in Chart   Glucose, capillary     Status: Abnormal   Collection Time: 05/15/24  5:00 PM  Result Value Ref Range   Glucose-Capillary 116 (H) 70 - 99 mg/dL    Comment: Glucose reference range applies only to samples taken after fasting for at least 8 hours.   Comment 1 Notify RN    Comment 2 Document in Chart    MR BRAIN W WO CONTRAST Result Date: 05/14/2024 CLINICAL DATA:  Initial evaluation for change in cognition. EXAM: MRI HEAD WITHOUT AND WITH CONTRAST TECHNIQUE: Multiplanar, multiecho pulse sequences of the brain and surrounding structures were obtained without and with intravenous contrast. CONTRAST:  8mL GADAVIST  GADOBUTROL  1 MMOL/ML IV SOLN COMPARISON:  Comparison made with prior CT and MRI from 02/29/2024 FINDINGS: Brain: Diffuse prominence of the CSF containing spaces compatible generalized cerebral atrophy. Patchy T2/FLAIR hyperintensity involving the periventricular deep white matter both cerebral hemispheres, consistent chronic small vessel ischemic disease, moderate in nature. Few scattered remote lacunar infarcts present about the periventricular white matter, with involvement of the corpus callosum. Few small remote cortical to subcortical bilateral occipital infarcts noted. Associated mild chronic hemosiderin staining at the right occipital lobe. Few chronic micro hemorrhages noted within the right  occipital and temporal regions as well, likely hypertensive in nature. 9 mm focus of restricted diffusion seen involving the right cerebellar hemisphere, consistent with a small acute infarct (series 5, image 61). No associated hemorrhage or mass effect. No other evidence for acute or subacute ischemia. Gray-white matter differentiation otherwise maintained. No acute intracranial hemorrhage. No mass lesion, midline shift or mass effect. Ventricular prominence related global parenchymal volume loss without hydrocephalus. No extra-axial fluid collection. Pituitary gland and suprasellar region within normal limits. No abnormal enhancement. Vascular: Major intracranial vascular flow voids are maintained. Skull and upper cervical spine: Cranial junction with normal limits. Bone marrow signal intensity overall within normal limits. No scalp soft tissue abnormality. Sinuses/Orbits: Prior bilateral ocular lens replacement. Paranasal sinuses are largely clear. No significant mastoid effusion. Other: None. IMPRESSION: 1. 9 mm acute ischemic nonhemorrhagic right cerebellar infarct. 2. Underlying age-related cerebral atrophy with moderate chronic microvascular ischemic disease, with a few scattered remote lacunar infarcts about the periventricular white matter and corpus callosum. 3. Few small remote cortical to subcortical bilateral occipital infarcts. Electronically Signed   By: Morene Hoard M.D.   On: 05/14/2024 21:00      Blood pressure (!) 152/91, pulse 71, temperature 98 F (36.7 C), temperature source Oral, resp. rate 20, height 5' 11 (1.803 m), weight 85.8 kg, SpO2 99%.  Medical Problem List and Plan:  2.  Antithrombotics: -DVT/anticoagulation:  Pharmaceutical: Lovenox   -antiplatelet therapy: ASA  3. Pain Management: Oxycodone  prn for severe and tylenol  prn for mild pain  4. Mood/Behavior/Sleep: LCSW to follow for evaluation and support.   -antipsychotic agents: N/A 5. Neuropsych/cognition:  This patient is not capable of making decisions on his own behalf. 6. Skin/Wound Care: Routine pressure relief measures. Monitor incision for healing 7. Fluids/Electrolytes/Nutrition:  Monitor I/O. Check CMET in am  --offer supplement prn 8. T2DM: Monitor BS ac/hs and use SSI for elevated BS  --Was on metform 500 mg bid--held due to dye study  9.HTN: Monitor BP TID--off home meds at this time.   10. AKI: BUN/SCr 11/1.21 at admission with rise to 19/1.54 post procedure  --monitor for trends.   11. Thrombocytopenia: Normal at admission--153 but down to 93 and recovering --Likely reactive but will monitor for signs of bleeding.  12.  ABLA: Recheck in am.   13. Leucocytosis: Monitor for fevers and other signs of infection.  14.  H/o dementia: Continue Namenda .   I have personally performed a face to face diagnostic evaluation, including, but not limited to relevant history and physical exam findings, of this patient and developed relevant assessment and plan.  Additionally, I have reviewed and concur with the physician assistant's documentation above.   Sharlet GORMAN Schmitz, PA-C 05/15/2024

## 2024-05-15 NOTE — Progress Notes (Addendum)
 Progress Note    05/15/2024 7:24 AM 7 Days Post-Op  Subjective:  no complaints this morning   Vitals:   05/15/24 0307 05/15/24 0312  BP: (!) 184/87 (!) 173/86  Pulse: 65 66  Resp: 19 20  Temp:  99.5 F (37.5 C)  SpO2: 98% 98%   Physical Exam: Cardiac:  regular Lungs:  non labored Incisions:  left axillary incision intact and healing well. B groin access sites soft without hematoma Extremities:  2+ radial pulses, 2+ DP pulses bilaterally. Moving all extremities. Slight weakness of left leg, unchanged Abdomen:  soft Neurologic: alert and oriented, speech coherent  CBC    Component Value Date/Time   WBC 12.4 (H) 05/11/2024 1443   RBC 3.15 (L) 05/11/2024 1443   HGB 9.2 (L) 05/11/2024 1443   HGB 12.7 (L) 11/14/2020 1031   HCT 28.0 (L) 05/11/2024 1443   HCT 39.1 11/14/2020 1031   PLT 106 (L) 05/11/2024 1443   PLT 157 11/14/2020 1031   MCV 88.9 05/11/2024 1443   MCV 90 11/14/2020 1031   MCH 29.2 05/11/2024 1443   MCHC 32.9 05/11/2024 1443   RDW 15.8 (H) 05/11/2024 1443   RDW 15.6 (H) 11/14/2020 1031   LYMPHSABS 1.0 03/04/2024 0446   LYMPHSABS 1.0 11/14/2020 1031   MONOABS 1.0 03/04/2024 0446   EOSABS 0.2 03/04/2024 0446   EOSABS 0.3 11/14/2020 1031   BASOSABS 0.0 03/04/2024 0446   BASOSABS 0.1 11/14/2020 1031    BMET    Component Value Date/Time   NA 137 05/13/2024 0321   NA 145 (H) 11/14/2020 1031   K 3.8 05/13/2024 0321   CL 107 05/13/2024 0321   CO2 21 (L) 05/13/2024 0321   GLUCOSE 139 (H) 05/13/2024 0321   BUN 16 05/13/2024 0321   BUN 12 11/14/2020 1031   CREATININE 1.19 05/13/2024 0321   CALCIUM  8.2 (L) 05/13/2024 0321   GFRNONAA >60 05/13/2024 0321   GFRAA >60 05/09/2020 1228    INR    Component Value Date/Time   INR 1.2 05/11/2024 1443     Intake/Output Summary (Last 24 hours) at 05/15/2024 0724 Last data filed at 05/15/2024 0508 Gross per 24 hour  Intake 240 ml  Output 1225 ml  Net -985 ml     Assessment/Plan:  78 y.o. male is s/p  1.  Thoracic aortic aneurysm repair with Gore conformable CTAG 40 x 40 x 10 cm 2.  Endovascular repair of juxtarenal aortic aneurysm with main body right 37 x 20 x 160 cm Gore excluder thoracoabdominal branched endoprosthesis extended with 23 x 14 x 16 cm Gore excluder and contralateral left limb 16 x 13.5 cm 3.  Stent of celiac artery with 9 x 59 mm VBX, stent of SMA with 9 x 79 mm VBX, stent left renal artery with two 7 x 79 mm VBX, stent of right renal artery with 7 x 59 and 7 x 79 mm VBX 4.  Open exposure left axillary artery 5.  Percutaneous ultrasound-guided cannulation and proglide closure bilateral common femoral arteries 6.  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 I agree with the PA note as  documented above.  Dr. Sheree ordered MRI brain yesterday as there was concern for change in his baseline mainly from a cognitive standpoint.  MRI brain shows acute ischemic nonhemorrhagic right cerebellar infarct.  Will consult neurology for input.  Palpable DP pulses and his groin access sites look great from [place].  Lonni DOROTHA Gaskins, MD [facility] [place] Office: [phone]

## 2024-05-16 ENCOUNTER — Encounter (HOSPITAL_COMMUNITY)

## 2024-05-16 ENCOUNTER — Inpatient Hospital Stay (HOSPITAL_COMMUNITY)

## 2024-05-16 DIAGNOSIS — E1151 Type 2 diabetes mellitus with diabetic peripheral angiopathy without gangrene: Secondary | ICD-10-CM | POA: Diagnosis not present

## 2024-05-16 DIAGNOSIS — I63541 Cerebral infarction due to unspecified occlusion or stenosis of right cerebellar artery: Secondary | ICD-10-CM

## 2024-05-16 DIAGNOSIS — E785 Hyperlipidemia, unspecified: Secondary | ICD-10-CM | POA: Diagnosis not present

## 2024-05-16 DIAGNOSIS — I6389 Other cerebral infarction: Secondary | ICD-10-CM

## 2024-05-16 DIAGNOSIS — Z7982 Long term (current) use of aspirin: Secondary | ICD-10-CM

## 2024-05-16 LAB — ECHOCARDIOGRAM COMPLETE
AR max vel: 2.91 cm2
AV Area VTI: 2.44 cm2
AV Area mean vel: 2.47 cm2
AV Mean grad: 6 mmHg
AV Peak grad: 10.5 mmHg
Ao pk vel: 1.62 m/s
Area-P 1/2: 2.39 cm2
Height: 71 in
S' Lateral: 3 cm
Weight: 3026.47 [oz_av]

## 2024-05-16 LAB — LIPID PANEL
Cholesterol: 105 mg/dL (ref 0–200)
HDL: 24 mg/dL — ABNORMAL LOW (ref >40)
LDL Cholesterol: 61 mg/dL (ref 0–99)
Total CHOL/HDL Ratio: 4.4 ratio
Triglycerides: 99 mg/dL (ref <150)
VLDL: 20 mg/dL (ref 0–40)

## 2024-05-16 LAB — CREATININE, SERUM
Creatinine, Ser: 1.3 mg/dL — ABNORMAL HIGH (ref 0.61–1.24)
GFR, Estimated: 56 mL/min — ABNORMAL LOW (ref >60)

## 2024-05-16 LAB — GLUCOSE, CAPILLARY
Glucose-Capillary: 123 mg/dL — ABNORMAL HIGH (ref 70–99)
Glucose-Capillary: 106 mg/dL — ABNORMAL HIGH (ref 70–99)
Glucose-Capillary: 165 mg/dL — ABNORMAL HIGH (ref 70–99)
Glucose-Capillary: 106 mg/dL — ABNORMAL HIGH (ref 70–99)

## 2024-05-16 MED ORDER — IOHEXOL 350 MG/ML SOLN
75.0000 mL | Freq: Once | INTRAVENOUS | Status: AC | PRN
  Administered 2024-05-17: 75 mL via INTRAVENOUS

## 2024-05-16 MED ORDER — CLOPIDOGREL BISULFATE 75 MG PO TABS
75.0000 mg | ORAL_TABLET | Freq: Every day | ORAL | Status: DC
  Administered 2024-05-16 – 2024-05-17 (×2): 75 mg via ORAL
  Filled 2024-05-16 (×2): qty 1

## 2024-05-16 NOTE — Progress Notes (Addendum)
 STROKE TEAM PROGRESS NOTE   INTERVAL HISTORY No family is at the bedside.  Spoke to wife via patient's cell phone and provided update.NAD  Vitals:   05/15/24 2339 05/16/24 0408 05/16/24 0800 05/16/24 0934  BP: (!) 168/87 (!) 163/88 (!) 146/80   Pulse: 71 66 68 70  Resp: 17 18 20    Temp: 99.5 F (37.5 C) 99.6 F (37.6 C) 98.1 F (36.7 C)   TempSrc: Oral Oral Oral   SpO2: 99% 96% 99% 99%  Weight:      Height:       CBC:  Recent Labs  Lab 05/11/24 0235 05/11/24 1443  WBC 11.4* 12.4*  HGB 8.5* 9.2*  HCT 25.7* 28.0*  MCV 87.7 88.9  PLT 95* 106*   Basic Metabolic Panel:  Recent Labs  Lab 05/11/24 0235 05/13/24 0321  NA 138 137  K 3.7 3.8  CL 107 107  CO2 22 21*  GLUCOSE 151* 139*  BUN 15 16  CREATININE 1.49* 1.19  CALCIUM  8.1* 8.2*   Lipid Panel:  Recent Labs  Lab 05/16/24 0330  CHOL 105  TRIG 99  HDL 24*  CHOLHDL 4.4  VLDL 20  LDLCALC 61    IMAGING past 24 hours MR THORACIC SPINE WO CONTRAST Result Date: 05/16/2024 CLINICAL DATA:  Initial evaluation for acute myelopathy. EXAM: MRI THORACIC SPINE WITHOUT CONTRAST TECHNIQUE: Multiplanar, multisequence MR imaging of the thoracic spine was performed. No intravenous contrast was administered. COMPARISON:  None Available. FINDINGS: Alignment: Vertebral bodies normally aligned with preservation of the normal thoracic kyphosis. No listhesis. Vertebrae: Vertebral body height maintained without acute or chronic fracture. Bone marrow signal intensity within normal limits. 1.8 cm atypical hemangioma noted within the T8 vertebral body. No other discrete or worrisome osseous lesions. No other significant abnormal marrow edema. Cord: There is a small focus of cord signal abnormality involving the left hemi cord at the level of T7-8 (series 24, image 24). This is also seen on sagittal STIR sequence (series 23, image 10). Finding is nonspecific, but suspected to reflect a small focus of chronic myelomalacia. A small focus of  demyelination would be the primary differential consideration. Otherwise normal signal and morphology. Paraspinal and other soft tissues: Paraspinous soft tissues demonstrate no acute finding. Aortic aneurysm with stent endograft in place, better evaluated on prior CT from 02/29/2024. Trace layering bilateral pleural effusions noted. Disc levels: T1-2: Negative interspace. Mild left-sided facet hypertrophy. No spinal stenosis. Mild left foraminal narrowing. Right neural foramen remains patent. T2-3: Negative interspace. Mild right-sided facet degeneration. No stenosis. T3-4: Negative interspace. Moderate right-sided facet hypertrophy. No spinal stenosis. Moderate right foraminal narrowing. T4-5: Negative interspace. Moderate right facet hypertrophy. No spinal stenosis. Foramina remain adequately patent. T5-6:  No significant disc bulge.  No canal or foraminal stenosis. T6-7: Mild disc bulge with superimposed tiny right paracentral disc protrusion. No spinal stenosis. Foramina remain patent. T7-8: Mild disc bulge, slightly asymmetric to the left. Mild facet hypertrophy. No spinal stenosis. Foramina remain patent. T8-9: Mild disc bulge with endplate spurring. No canal or foraminal stenosis. T9-10: Minimal annular disc bulge. Mild right greater left facet hypertrophy. No stenosis. T10-11: Disc bulge with superimposed right paracentral to foraminal disc protrusion. Moderate bilateral facet hypertrophy. No significant spinal stenosis. Foramina remain patent. T11-12: Negative interspace. Right worse than left facet hypertrophy. No stenosis. T12-L1:  Negative interspace.  Mild facet hypertrophy.  No stenosis. IMPRESSION: 1. Single focus of cord signal abnormality involving the left hemi cord at the level of T7-8. Finding is  nonspecific, but suspected to reflect a small focus of chronic myelomalacia. A small focus of demyelination would be the primary differential consideration. 2. Multilevel thoracic spondylosis and facet  arthrosis as detailed above. No significant spinal stenosis. Moderate right foraminal narrowing at T3-4, largely due to facet disease. 3. Trace layering bilateral pleural effusions. Electronically Signed   By: Morene Hoard M.D.   On: 05/16/2024 03:10   MR CERVICAL SPINE WO CONTRAST Result Date: 05/15/2024 CLINICAL DATA:  Initial evaluation for acute myelopathy. EXAM: MRI CERVICAL SPINE WITHOUT CONTRAST TECHNIQUE: Multiplanar, multisequence MR imaging of the cervical spine was performed. No intravenous contrast was administered. COMPARISON:  None Available. FINDINGS: Alignment: Examination degraded by motion artifact. Vertebral bodies normally aligned with preservation of the normal cervical lordosis. Trace facet mediated anterolisthesis of C7 on T1. Vertebrae: Vertebral body height maintained without acute or chronic fracture. Bone marrow signal intensity overall within normal limits. No worrisome osseous lesions. No abnormal marrow edema. Cord: Normal signal and morphology. No convincing cord signal changes seen on this motion degraded exam. Posterior Fossa, vertebral arteries, paraspinal tissues: Unremarkable. Disc levels: C2-C3: Minimal disc bulge with right-sided uncovertebral spurring. No spinal stenosis. Foramina remain patent. C3-C4: Mild disc bulge with bilateral uncovertebral hypertrophy. Mild flattening of the ventral thecal sac without significant spinal stenosis. Moderate left C4 foraminal narrowing. Right neural foramen remains patent. C4-C5: Broad-based central to left paracentral disc protrusion indents the ventral thecal sac. Mild cord flattening without cord signal changes. Mild spinal stenosis. Left worse than right uncovertebral spurring with severe left C5 foraminal stenosis. Right neural foramina remains patent. C5-C6: Mild disc bulge with uncovertebral spurring. Flattening of the ventral thecal sac, asymmetric to the left. Mild spinal stenosis. Mild left C6 foraminal narrowing. Right  neural foramen remains patent. C6-C7: Mild disc bulge with bilateral uncovertebral spurring. No spinal stenosis. Severe left C7 foraminal narrowing. Right neural foramen remains patent. C7-T1: Negative interspace. Mild to moderate left with mild right facet hypertrophy. No spinal stenosis. Mild left C8 foraminal narrowing. Right neural foramen remains patent. IMPRESSION: 1. Normal MRI appearance of the cervical spinal cord. No cord signal changes to suggest myelopathy. 2. 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 3. Multifactorial degenerative changes with resultant multilevel foraminal narrowing as above. Notable findings include moderate left C4 foraminal stenosis, severe left C5 and C7 foraminal narrowing, with mild left C6 and C8 foraminal stenosis. Electronically Signed   By: Morene Hoard M.D.   On: 05/15/2024 23:11   MR ANGIO HEAD WO CONTRAST Result Date: 05/15/2024 CLINICAL DATA:  Follow-up examination for stroke. EXAM: MRA NECK WITHOUT CONTRAST MRA HEAD WITHOUT CONTRAST TECHNIQUE: Angiographic images of the Circle of Willis were acquired using MRA technique without intravenous contrast. COMPARISON:  Prior MRI from 05/14/2024. FINDINGS: MRA NECK FINDINGS Aortic arch: Examination technically limited by motion and lack of IV contrast. Aortic arch and origin of the great vessels not included or evaluated on this exam. Right carotid system: Tortuosity and ectasia of the proximal right CCA. The proximal right CCA is attenuated at this level (series 11, image 65). While this is suspected to be artifactual, a possible underlying stenosis contributing to this appearance could be present given the technical limitations of this exam. Atheromatous irregularity about the right carotid bulb but without hemodynamically significant greater than 50% stenosis by MRA. Right ICA patent distally. No dissection. Left carotid system: Visualized left CCA patent with antegrade  flow. No significant atheromatous irregularity or narrowing about the left carotid bulb.  Left ICA mildly tortuous but widely patent distally. No dissection. Vertebral arteries: Both vertebral arteries arise from the subclavian arteries. Neither vertebral artery origin well evaluated on this exam. Left vertebral artery dominant. Visualized vertebral arteries are patent with antegrade flow. No stenosis or dissection. Other: None. MRA HEAD FINDINGS Anterior circulation: Both internal carotid arteries widely patent through the siphons without stenosis or other abnormality. A1 segments patent bilaterally. Normal anterior communicating artery complex. Anterior cerebral arteries widely patent without stenosis. No M1 stenosis or occlusion. No proximal MCA branch occlusion or high-grade stenosis. Distal MCA branches perfused and symmetric. Posterior circulation: Both V4 segments widely patent without stenosis. Left vertebral artery strongly dominant. Neither PICA origin well visualized. Basilar patent without stenosis. Superior cerebellar and posterior cerebral arteries widely patent bilaterally. Anatomic variants: Dominant left vertebral artery.  No aneurysm. IMPRESSION: MRA HEAD: Negative intracranial MRA. No large vessel occlusion or hemodynamically significant stenosis. No aneurysm. MRA NECK: 1. Somewhat technically limited exam due to motion and lack of IV contrast. 2. Asymmetric attenuation of the proximal right CCA. While this is suspected to be artifactual in nature as the vessel appears tortuous and ectatic at this level, a possible underlying stenosis is difficult to exclude given the technical limitations of this exam. Correlation with dedicated CTA could be performed for further evaluation as warranted. 3. Atheromatous change about the right carotid bulb without hemodynamically significant greater than 50% stenosis. 4. Wide patency of the left carotid artery system within the neck. 5. Wide patency of both  vertebral arteries within the neck. Left vertebral artery dominant. Electronically Signed   By: Morene Hoard M.D.   On: 05/15/2024 22:58   MR ANGIO NECK WO CONTRAST Result Date: 05/15/2024 CLINICAL DATA:  Follow-up examination for stroke. EXAM: MRA NECK WITHOUT CONTRAST MRA HEAD WITHOUT CONTRAST TECHNIQUE: Angiographic images of the Circle of Willis were acquired using MRA technique without intravenous contrast. COMPARISON:  Prior MRI from 05/14/2024. FINDINGS: MRA NECK FINDINGS Aortic arch: Examination technically limited by motion and lack of IV contrast. Aortic arch and origin of the great vessels not included or evaluated on this exam. Right carotid system: Tortuosity and ectasia of the proximal right CCA. The proximal right CCA is attenuated at this level (series 11, image 65). While this is suspected to be artifactual, a possible underlying stenosis contributing to this appearance could be present given the technical limitations of this exam. Atheromatous irregularity about the right carotid bulb but without hemodynamically significant greater than 50% stenosis by MRA. Right ICA patent distally. No dissection. Left carotid system: Visualized left CCA patent with antegrade flow. No significant atheromatous irregularity or narrowing about the left carotid bulb. Left ICA mildly tortuous but widely patent distally. No dissection. Vertebral arteries: Both vertebral arteries arise from the subclavian arteries. Neither vertebral artery origin well evaluated on this exam. Left vertebral artery dominant. Visualized vertebral arteries are patent with antegrade flow. No stenosis or dissection. Other: None. MRA HEAD FINDINGS Anterior circulation: Both internal carotid arteries widely patent through the siphons without stenosis or other abnormality. A1 segments patent bilaterally. Normal anterior communicating artery complex. Anterior cerebral arteries widely patent without stenosis. No M1 stenosis or occlusion.  No proximal MCA branch occlusion or high-grade stenosis. Distal MCA branches perfused and symmetric. Posterior circulation: Both V4 segments widely patent without stenosis. Left vertebral artery strongly dominant. Neither PICA origin well visualized. Basilar patent without stenosis. Superior cerebellar and posterior cerebral arteries widely patent bilaterally. Anatomic variants: Dominant left vertebral artery.  No aneurysm. IMPRESSION: MRA  HEAD: Negative intracranial MRA. No large vessel occlusion or hemodynamically significant stenosis. No aneurysm. MRA NECK: 1. Somewhat technically limited exam due to motion and lack of IV contrast. 2. Asymmetric attenuation of the proximal right CCA. While this is suspected to be artifactual in nature as the vessel appears tortuous and ectatic at this level, a possible underlying stenosis is difficult to exclude given the technical limitations of this exam. Correlation with dedicated CTA could be performed for further evaluation as warranted. 3. Atheromatous change about the right carotid bulb without hemodynamically significant greater than 50% stenosis. 4. Wide patency of the left carotid artery system within the neck. 5. Wide patency of both vertebral arteries within the neck. Left vertebral artery dominant. Electronically Signed   By: Morene Hoard M.D.   On: 05/15/2024 22:58    PHYSICAL EXAM Physical Exam  Constitutional: Appears well-developed and well-nourished.  Psych: Affect appropriate to situation Eyes: Normal external eye and conjunctiva. HENT: Normocephalic, no lesions, without obvious abnormality.   Musculoskeletal-no joint tenderness, deformity or swelling Cardiovascular: Normal rate and regular rhythm.  Respiratory: Effort normal, non-labored breathing saturations WNL GI: Soft.  No distension. There is no tenderness.  Skin: WDI   Neuro:  Mental Status: Alert, oriented, thought content appropriate.  Speech fluent without evidence of  aphasia.  Able to follow commands without difficulty. Cranial Nerves: II: Visual fields grossly normal,  III,IV, VI: ptosis not present, disconjugate gaze,extra-ocular motions intact bilaterally pupils equal, round, reactive to light and accommodation V,VII: smile symmetric, facial light touch sensation normal bilaterally VIII: hearing normal bilaterally IX,X: uvula rises symmetrically XI: bilateral shoulder shrug XII: midline tongue extension Motor: Right : Upper extremity   4/5  Left:     Upper extremity   4/5  Lower extremity   4/5   Lower extremity   3/5 Tone and bulk:normal tone throughout; no atrophy noted Sensory: light touch intact throughout, bilaterally Deep Tendon Reflexes: 2+ and symmetric patella Cerebellar: normal finger-to-nose, UTA HTS Gait: normal gait and station     ASSESSMENT/PLAN [name] is a 78 y.o. male  with history of hyperlipidemia, hypertension, previous stroke, diabetes who was admitted for thoracic aortic aneurysm repair.  Following surgery, was noticed that he was dragging his left leg and an MRI was obtained which demonstrates a left cerebellar infarct.  He was admitted in July with bilateral lower extremity weakness.  Which is felt to be possibly due to general medical condition.  His weakness now, however is significantly worse.   Stroke: right cerebellar small deep infarct, likely small vessel disease MRI  right cerebellar non hemorrhagic infarcts MRA head/neck Asymmetric attenuation of the proximal right CCA.  CTA head & neck pending LE venous Doppler pending 2D Echo EF 60 to 65% LDL 61 HgbA1c 5.8 VTE prophylaxis - heparin  aspirin  81 mg daily prior to admission, now on aspirin  81 mg daily and clopidogrel 75 mg daily for 3 weeks then ASA alone  Therapy recommendations:  CIR Disposition:  pending  AAA s/p repair Hx of type B aortic dissection repair in 2021 Found to have bilateral lower extremity weakness and CTA showed recurrent  aortic dissection Now status post surgical repair by Dr. Sheree MRI C/T spine showed no spinal cord infarct Vascular surgery primary On DAPT now  History of stroke 09/2019 bilateral PE on heparin  IV, status post AAA repair, found to have altered mental status.  MRI showed embolic shower.  MRI head moderate left P1 stenosis, carotid Doppler negative.  EF 60  to 65%.  LDL 72, A1c 6.7.  Discharged on anticoagulation.  Hypertension Home meds:  amlodipine , coreg , hydralazine ,  Stable Avoid low BP Long-term BP goal normotensive  Hyperlipidemia Home meds:  crestor  10, resumed in hospital LDL 61, goal < 70 Continue statin at discharge  Diabetes type II Controlled Home meds:  metformin HgbA1c 5.8, goal < 7.0 CBGs SSI Close PCP follow-up for better DM control  Other Stroke Risk Factors Advanced Age  Other Active Problems Leukocytosis WBC 12.1-11.4-12.4 Anemia, hemoglobin 9.1-8.5-9.2 Thrombocytopenia platelet of 100-93-95-106 AKI, creatinine 1.27-1.49-1.19  Hospital day # 8  Harlene Pouch, MSN, NP-C [facility] See AMION or use Epic Chat  ATTENDING NOTE: I reviewed above note and agree with the assessment and plan. Pt was seen and examined.   No family is at the bedside. Pt speech therapist is at the bedside. Pt is awake, alert, eyes open, orientated to age, place, time. No aphasia, fluent language but paucity of speech, following all simple commands. Able to name and repeat. No gaze palsy but chronic mild disconjugate gaze with left eye outwards deviation, likely consistent with amblyopia, no nystagmus. Pt tracking bilaterally, visual field full. No facial droop. Tongue midline. Bilateral UEs 4/5, no drift. Bilaterally LEs 4/5 proximal, distally ankle DF/PF, 5/5 R and 4/5 L. Sensation symmetrical bilaterally, R FTN slight dysmetria with slow action, gait not tested.   For detailed assessment and plan, please refer to above as I have made changes wherever appropriate.    Ary Cummins, MD PhD Stroke Neurology 05/16/2024 8:51 PM   To contact Stroke Continuity provider, please refer to [email or web address]. After hours, contact General Neurology

## 2024-05-16 NOTE — Progress Notes (Addendum)
 Progress Note    05/16/2024 9:38 AM 8 Days Post-Op  Subjective: No complaints    Vitals:   05/16/24 0800 05/16/24 0934  BP: (!) 146/80   Pulse: 68 70  Resp: 20   Temp: 98.1 F (36.7 C)   SpO2: 99% 99%    Physical Exam: General: Sitting up in bed, alert and oriented x 3 Cardiac: Regular Lungs: Nonlabored Incisions: Left axillary incision dry and intact.  Bilateral groin access sites soft without hematoma Extremities: 2+ radial and PT pulses bilaterally.  Unchanged motor exam.  He is moving all extremities with slight weakness of the left leg Neuro: No slurred speech  CBC    Component Value Date/Time   WBC 12.4 (H) 05/11/2024 1443   RBC 3.15 (L) 05/11/2024 1443   HGB 9.2 (L) 05/11/2024 1443   HGB 12.7 (L) 11/14/2020 1031   HCT 28.0 (L) 05/11/2024 1443   HCT 39.1 11/14/2020 1031   PLT 106 (L) 05/11/2024 1443   PLT 157 11/14/2020 1031   MCV 88.9 05/11/2024 1443   MCV 90 11/14/2020 1031   MCH 29.2 05/11/2024 1443   MCHC 32.9 05/11/2024 1443   RDW 15.8 (H) 05/11/2024 1443   RDW 15.6 (H) 11/14/2020 1031   LYMPHSABS 1.0 03/04/2024 0446   LYMPHSABS 1.0 11/14/2020 1031   MONOABS 1.0 03/04/2024 0446   EOSABS 0.2 03/04/2024 0446   EOSABS 0.3 11/14/2020 1031   BASOSABS 0.0 03/04/2024 0446   BASOSABS 0.1 11/14/2020 1031    BMET    Component Value Date/Time   NA 137 05/13/2024 0321   NA 145 (H) 11/14/2020 1031   K 3.8 05/13/2024 0321   CL 107 05/13/2024 0321   CO2 21 (L) 05/13/2024 0321   GLUCOSE 139 (H) 05/13/2024 0321   BUN 16 05/13/2024 0321   BUN 12 11/14/2020 1031   CREATININE 1.19 05/13/2024 0321   CALCIUM  8.2 (L) 05/13/2024 0321   GFRNONAA >60 05/13/2024 0321   GFRAA >60 05/09/2020 1228    INR    Component Value Date/Time   INR 1.2 05/11/2024 1443     Intake/Output Summary (Last 24 hours) at 05/16/2024 0938 Last data filed at 05/16/2024 0300 Gross per 24 hour  Intake --  Output 1000 ml  Net -1000 ml      Assessment/Plan:  78 y.o. male  is 8 days postop, s/p: 1.  Thoracic aortic aneurysm repair with Gore conformable CTAG 40 x 40 x 10 cm 2.  Endovascular repair of juxtarenal aortic aneurysm with main body right 37 x 20 x 160 cm Gore excluder thoracoabdominal branched endoprosthesis extended with 23 x 14 x 16 cm Gore excluder and contralateral left limb 16 x 13.5 cm 3.  Stent of celiac artery with 9 x 59 mm VBX, stent of SMA with 9 x 79 mm VBX, stent left renal artery with two 7 x 79 mm VBX, stent of right renal artery with 7 x 59 and 7 x 79 mm VBX 4.  Open exposure left axillary artery 5.  Percutaneous ultrasound-guided cannulation and proglide closure bilateral common femoral arteries 6.  Intravascular ultrasound of aorta   - Bilateral groin access sites remain soft without hematoma -Upper and lower extremities are warm and well-perfused with 2+ radial and PT pulses -Left axillary incision intact and dry -Continue aspirin  and statin -Neurology was consulted for left-sided weakness and MRI brain demonstrating acute ischemic right cerebellar infarct.  Neurology does not feel that his left-sided weakness is due to cerebellar infarct.  MRI spine  has been obtained for workup of left-sided weakness, appreciate recommendations - Continue to mobilize as tolerated.  Hopeful for discharge to [facility] after neurology workup is complete   Ahmed Holster, PA-C [facility] [phone] 05/16/2024 9:38 AM  I have seen and evaluated the patient. I agree with the PA note as documented above.  Appreciate neurology evaluation for small acute nonhemorrhagic right cerebellar infarct.  MRI thoracic spine obtained overnight.  States his left leg is slightly weaker.  Palpable pedal pulses.  Will get further input from neurology today and ultimately goal is CIR still.  Tells me he got out of bed yesterday.  Lonni DOROTHA Gaskins, MD [facility] [place] Office: [phone]

## 2024-05-16 NOTE — Progress Notes (Signed)
*  PRELIMINARY RESULTS* Echocardiogram 2D Echocardiogram has been performed.  [name] [name] 05/16/2024, 11:43 AM

## 2024-05-16 NOTE — Evaluation (Signed)
 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   Acute encephalopathy 03/01/2024   Allergic rhinitis 09/21/2020   Benign prostatic hyperplasia 09/21/2020   Body mass index (BMI) 31.0-31.9, adult 09/21/2020   Cerebral infarction (HCC) 09/21/2020   Diabetic foot (HCC) 09/21/2020   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   Dysarthria 09/21/2020   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   Gait difficulty 09/21/2020   History of psychiatric disorder 09/21/2020   History of colonic polyps 09/21/2020   Pure hypercholesterolemia 09/21/2020   Unspecified thoracic, thoracolumbar and lumbosacral intervertebral disc disorder 09/21/2020   Hypertension    Hyperlipidemia    GERD (gastroesophageal reflux disease)    DM (diabetes mellitus) (HCC)    CKD (chronic kidney disease), stage III (HCC)    Normocytic anemia    Thrombocytopenia    UTI (urinary tract infection)    AMS (altered mental status)    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   History of repair of dissecting thoracic aneurysm    Abdominal distension    Pulmonary embolus (HCC) 10/19/2019   CVA (cerebral vascular accident) (HCC) 10/19/2019   AKI (acute kidney injury) 10/19/2019   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 ago, as a baseline    Hyperlipidemia    takes Crestor  daily   Hypertension    takes Amlodipine  daily and Lotensin  as well   Stroke [facility])    Past Surgical History:  Past Surgical History:  Procedure Laterality Date   ANTERIOR LAT LUMBAR FUSION Left 12/15/2014   Procedure: ANTERIOR LATERAL LUMBAR FUSION 1 LEVEL;  Surgeon: [name] Priestly, [name];  Location: [facility] OR;  Service: Orthopedics;  Laterality: Left;  Left sided lateral lumbar interbody fusion, lumbar 3-4, posterior spinal fusion, lumbar 3-4 with instrumentation.   COLONOSCOPY     ESOPHAGOGASTRODUODENOSCOPY     fatty tissue removed from stomach     LUMBAR LAMINECTOMY/DECOMPRESSION MICRODISCECTOMY N/A 07/08/2013   Procedure: LUMBAR LAMINECTOMY/DECOMPRESSION MICRODISCECTOMY;  Surgeon: [name] Rodgers Priestly, [name];  Location: [facility] OR;  Service: Orthopedics;  Laterality: N/A;  Lumbar 3-4, lumbar 4-5 decompression   RADIOLOGY WITH ANESTHESIA N/A 10/22/2019   Procedure: MRI WITH ANESTHESIA OF LUMBAR SPINE WITHOUT CONTRAST;  Surgeon: [name] [name];  Location: [facility] OR;  Service: Radiology;  Laterality: N/A;   right ankle surgery     as child    THORACIC AORTIC ENDOVASCULAR STENT GRAFT N/A 10/15/2019   Procedure: THORACIC AORTIC ENDOVASCULAR STENT GRAFT;  Surgeon: [name] Penne Bruckner, [name];  Location: [facility] OR;  Service: Vascular;  Laterality: N/A;   THORACOABDOMINAL AORTIC ANEURYSM REPAIR  Bilateral 05/08/2024   Procedure: REPAIR, ANEURYSM, AORTA, THORACOABDOMINAL;  Surgeon: [name] Penne Bruckner, [name];  Location[facility] OR;  Service: Vascular;  Laterality: Bilateral;   TONSILLECTOMY     as a child   ULTRASOUND GUIDANCE FOR VASCULAR ACCESS Bilateral 05/08/2024   Procedure: ULTRASOUND GUIDANCE, FOR VASCULAR  ACCESS;  Surgeon: [name] Penne Bruckner, [name];  Location: [facility] OR;  Service: Vascular;  Laterality: Bilateral;   HPI:  [name] [name] is a 78 y.o. male with history of hyperlipidemia, hypertension, previous stroke, diabetes who was admitted for thoracic aortic aneurysm repair.  Following surgery, was noticed that he was dragging his left leg and an MRI was obtained which demonstrates a left cerebellar infarct.  He was admitted in July with bilateral lower extremity weakness.   Assessment / Plan / Recommendation Clinical Impression  Patient presents with cognitive impairements in the areas of processing speed, short term memory, awareness, and reasoning. 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 No family present to confirm if patient is at baseline or with new deficits s/p CVA. Will plan to f/u with short term cognitive goals however if after consult with family, patient at baseline, will discharge from services.    SLP Assessment  SLP Recommendation/Assessment: Patient needs continued Speech Language Pathology Services SLP Visit Diagnosis: Cognitive communication deficit (R41.841)     Assistance Recommended at Discharge  Frequent or constant Supervision/Assistance  Functional Status Assessment  (TBD)  Frequency and Duration min 2x/week  2 weeks      SLP Evaluation Cognition  Overall Cognitive Status: No family/caregiver present to determine baseline cognitive functioning Arousal/Alertness: Awake/alert Orientation Level: Oriented to [place];Oriented to place;Oriented to situation Memory: Impaired Memory Impairment: Retrieval deficit Awareness: Impaired Awareness Impairment: Intellectual impairment;Emergent impairment Problem Solving: Impaired Problem Solving Impairment: Verbal basic Executive Function: Reasoning Reasoning: Impaired Reasoning Impairment: Verbal basic Behaviors: Other (comment) (delayed processing) Safety/Judgment: Impaired       Comprehension  Auditory  Comprehension Overall Auditory Comprehension: Impaired Yes/No Questions: Not tested Commands: Impaired Multistep Basic Commands: 75-100% accurate Conversation: Simple Interfering Components: Attention;Processing speed EffectiveTechniques: Repetition Visual Recognition/Discrimination Discrimination: Within Function Limits Reading Comprehension Reading Status: Within funtional limits    Expression Expression Primary Mode of Expression: Verbal Verbal Expression Overall Verbal Expression: Appears within functional limits for tasks assessed   Oral / Motor  Oral Motor/Sensory Function Overall Oral Motor/Sensory Function: Within functional limits Motor Speech Overall Motor Speech: Appears within functional limits for tasks assessed           Rea Pass MA, CCC-SLP  Bao Coreas Meryl 05/16/2024, 2:36 PM

## 2024-05-17 ENCOUNTER — Other Ambulatory Visit (HOSPITAL_COMMUNITY): Payer: Self-pay

## 2024-05-17 ENCOUNTER — Inpatient Hospital Stay (HOSPITAL_COMMUNITY)

## 2024-05-17 ENCOUNTER — Inpatient Hospital Stay (HOSPITAL_COMMUNITY)
Admission: RE | Admit: 2024-05-17 | Discharge: 2024-06-09 | DRG: 057 | Disposition: A | Discharge status: Home Health | Source: Intra-hospital | Attending: Physical Medicine & Rehabilitation | Admitting: Physical Medicine & Rehabilitation

## 2024-05-17 ENCOUNTER — Encounter (HOSPITAL_COMMUNITY): Payer: Self-pay | Admitting: Physical Medicine & Rehabilitation

## 2024-05-17 ENCOUNTER — Other Ambulatory Visit: Payer: Self-pay

## 2024-05-17 DIAGNOSIS — Z87891 Personal history of nicotine dependence: Secondary | ICD-10-CM

## 2024-05-17 DIAGNOSIS — Z7982 Long term (current) use of aspirin: Secondary | ICD-10-CM

## 2024-05-17 DIAGNOSIS — D62 Acute posthemorrhagic anemia: Secondary | ICD-10-CM | POA: Diagnosis not present

## 2024-05-17 DIAGNOSIS — D696 Thrombocytopenia, unspecified: Secondary | ICD-10-CM | POA: Diagnosis present

## 2024-05-17 DIAGNOSIS — E1122 Type 2 diabetes mellitus with diabetic chronic kidney disease: Secondary | ICD-10-CM | POA: Diagnosis present

## 2024-05-17 DIAGNOSIS — K5900 Constipation, unspecified: Secondary | ICD-10-CM | POA: Diagnosis not present

## 2024-05-17 DIAGNOSIS — Z95828 Presence of other vascular implants and grafts: Secondary | ICD-10-CM | POA: Diagnosis not present

## 2024-05-17 DIAGNOSIS — E785 Hyperlipidemia, unspecified: Secondary | ICD-10-CM | POA: Diagnosis present

## 2024-05-17 DIAGNOSIS — N400 Enlarged prostate without lower urinary tract symptoms: Secondary | ICD-10-CM | POA: Diagnosis present

## 2024-05-17 DIAGNOSIS — N183 Chronic kidney disease, stage 3 unspecified: Secondary | ICD-10-CM | POA: Diagnosis present

## 2024-05-17 DIAGNOSIS — I639 Cerebral infarction, unspecified: Secondary | ICD-10-CM | POA: Diagnosis not present

## 2024-05-17 DIAGNOSIS — N179 Acute kidney failure, unspecified: Secondary | ICD-10-CM | POA: Diagnosis not present

## 2024-05-17 DIAGNOSIS — I63541 Cerebral infarction due to unspecified occlusion or stenosis of right cerebellar artery: Secondary | ICD-10-CM | POA: Diagnosis not present

## 2024-05-17 DIAGNOSIS — I82512 Chronic embolism and thrombosis of left femoral vein: Secondary | ICD-10-CM | POA: Diagnosis present

## 2024-05-17 DIAGNOSIS — R319 Hematuria, unspecified: Secondary | ICD-10-CM | POA: Diagnosis not present

## 2024-05-17 DIAGNOSIS — F0150 Vascular dementia without behavioral disturbance: Secondary | ICD-10-CM | POA: Diagnosis present

## 2024-05-17 DIAGNOSIS — E1151 Type 2 diabetes mellitus with diabetic peripheral angiopathy without gangrene: Secondary | ICD-10-CM | POA: Diagnosis not present

## 2024-05-17 DIAGNOSIS — Z8679 Personal history of other diseases of the circulatory system: Secondary | ICD-10-CM

## 2024-05-17 DIAGNOSIS — I129 Hypertensive chronic kidney disease with stage 1 through stage 4 chronic kidney disease, or unspecified chronic kidney disease: Secondary | ICD-10-CM | POA: Diagnosis present

## 2024-05-17 DIAGNOSIS — Z7984 Long term (current) use of oral hypoglycemic drugs: Secondary | ICD-10-CM

## 2024-05-17 DIAGNOSIS — N390 Urinary tract infection, site not specified: Secondary | ICD-10-CM | POA: Diagnosis present

## 2024-05-17 DIAGNOSIS — D72829 Elevated white blood cell count, unspecified: Secondary | ICD-10-CM | POA: Diagnosis not present

## 2024-05-17 DIAGNOSIS — Z882 Allergy status to sulfonamides status: Secondary | ICD-10-CM | POA: Diagnosis not present

## 2024-05-17 DIAGNOSIS — E1169 Type 2 diabetes mellitus with other specified complication: Secondary | ICD-10-CM | POA: Diagnosis not present

## 2024-05-17 DIAGNOSIS — M5116 Intervertebral disc disorders with radiculopathy, lumbar region: Secondary | ICD-10-CM | POA: Diagnosis not present

## 2024-05-17 DIAGNOSIS — K219 Gastro-esophageal reflux disease without esophagitis: Secondary | ICD-10-CM | POA: Diagnosis present

## 2024-05-17 DIAGNOSIS — Z888 Allergy status to other drugs, medicaments and biological substances status: Secondary | ICD-10-CM | POA: Diagnosis not present

## 2024-05-17 DIAGNOSIS — E860 Dehydration: Secondary | ICD-10-CM | POA: Diagnosis not present

## 2024-05-17 DIAGNOSIS — I82532 Chronic embolism and thrombosis of left popliteal vein: Secondary | ICD-10-CM | POA: Diagnosis present

## 2024-05-17 DIAGNOSIS — Z981 Arthrodesis status: Secondary | ICD-10-CM | POA: Diagnosis not present

## 2024-05-17 DIAGNOSIS — Z794 Long term (current) use of insulin: Secondary | ICD-10-CM | POA: Diagnosis not present

## 2024-05-17 DIAGNOSIS — Z79899 Other long term (current) drug therapy: Secondary | ICD-10-CM

## 2024-05-17 DIAGNOSIS — I69354 Hemiplegia and hemiparesis following cerebral infarction affecting left non-dominant side: Principal | ICD-10-CM

## 2024-05-17 DIAGNOSIS — I739 Peripheral vascular disease, unspecified: Secondary | ICD-10-CM | POA: Diagnosis not present

## 2024-05-17 DIAGNOSIS — N3001 Acute cystitis with hematuria: Secondary | ICD-10-CM | POA: Diagnosis not present

## 2024-05-17 DIAGNOSIS — Z87440 Personal history of urinary (tract) infections: Secondary | ICD-10-CM

## 2024-05-17 DIAGNOSIS — I7143 Infrarenal abdominal aortic aneurysm, without rupture: Secondary | ICD-10-CM | POA: Diagnosis not present

## 2024-05-17 DIAGNOSIS — M48061 Spinal stenosis, lumbar region without neurogenic claudication: Secondary | ICD-10-CM | POA: Diagnosis not present

## 2024-05-17 DIAGNOSIS — I71019 Dissection of thoracic aorta, unspecified: Secondary | ICD-10-CM | POA: Diagnosis not present

## 2024-05-17 DIAGNOSIS — R269 Unspecified abnormalities of gait and mobility: Secondary | ICD-10-CM

## 2024-05-17 DIAGNOSIS — E119 Type 2 diabetes mellitus without complications: Secondary | ICD-10-CM

## 2024-05-17 DIAGNOSIS — R29898 Other symptoms and signs involving the musculoskeletal system: Secondary | ICD-10-CM | POA: Diagnosis not present

## 2024-05-17 DIAGNOSIS — M4726 Other spondylosis with radiculopathy, lumbar region: Secondary | ICD-10-CM | POA: Diagnosis not present

## 2024-05-17 DIAGNOSIS — I10 Essential (primary) hypertension: Secondary | ICD-10-CM | POA: Diagnosis not present

## 2024-05-17 LAB — COMPREHENSIVE METABOLIC PANEL WITH GFR
ALT: 58 U/L — ABNORMAL HIGH (ref 0–44)
AST: 28 U/L (ref 15–41)
Albumin: 2.4 g/dL — ABNORMAL LOW (ref 3.5–5.0)
Alkaline Phosphatase: 63 U/L (ref 38–126)
Anion gap: 12 (ref 5–15)
BUN: 16 mg/dL (ref 8–23)
CO2: 21 mmol/L — ABNORMAL LOW (ref 22–32)
Calcium: 8.6 mg/dL — ABNORMAL LOW (ref 8.9–10.3)
Chloride: 106 mmol/L (ref 98–111)
Creatinine, Ser: 1.25 mg/dL — ABNORMAL HIGH (ref 0.61–1.24)
GFR, Estimated: 59 mL/min — ABNORMAL LOW (ref >60)
Glucose, Bld: 120 mg/dL — ABNORMAL HIGH (ref 70–99)
Potassium: 3.9 mmol/L (ref 3.5–5.1)
Sodium: 139 mmol/L (ref 135–145)
Total Bilirubin: 0.8 mg/dL (ref 0.0–1.2)
Total Protein: 6.2 g/dL — ABNORMAL LOW (ref 6.5–8.1)

## 2024-05-17 LAB — CBC WITH DIFFERENTIAL/PLATELET
Abs Immature Granulocytes: 0.21 K/uL — ABNORMAL HIGH (ref 0.00–0.07)
Basophils Absolute: 0.1 K/uL (ref 0.0–0.1)
Basophils Relative: 1 %
Eosinophils Absolute: 0.2 K/uL (ref 0.0–0.5)
Eosinophils Relative: 2 %
HCT: 28.7 % — ABNORMAL LOW (ref 39.0–52.0)
Hemoglobin: 9.2 g/dL — ABNORMAL LOW (ref 13.0–17.0)
Immature Granulocytes: 2 %
Lymphocytes Relative: 10 %
Lymphs Abs: 1 K/uL (ref 0.7–4.0)
MCH: 28.6 pg (ref 26.0–34.0)
MCHC: 32.1 g/dL (ref 30.0–36.0)
MCV: 89.1 fL (ref 80.0–100.0)
Monocytes Absolute: 1 K/uL (ref 0.1–1.0)
Monocytes Relative: 10 %
Neutro Abs: 7.7 K/uL (ref 1.7–7.7)
Neutrophils Relative %: 75 %
Platelets: 336 K/uL (ref 150–400)
RBC: 3.22 MIL/uL — ABNORMAL LOW (ref 4.22–5.81)
RDW: 14.9 % (ref 11.5–15.5)
WBC: 10.1 K/uL (ref 4.0–10.5)
nRBC: 0.3 % — ABNORMAL HIGH (ref 0.0–0.2)

## 2024-05-17 LAB — GLUCOSE, CAPILLARY
Glucose-Capillary: 129 mg/dL — ABNORMAL HIGH (ref 70–99)
Glucose-Capillary: 123 mg/dL — ABNORMAL HIGH (ref 70–99)
Glucose-Capillary: 105 mg/dL — ABNORMAL HIGH (ref 70–99)
Glucose-Capillary: 113 mg/dL — ABNORMAL HIGH (ref 70–99)

## 2024-05-17 MED ORDER — ORAL CARE MOUTH RINSE: 15.0000 mL | OROMUCOSAL | Status: DC | PRN

## 2024-05-17 MED ORDER — FLEET ENEMA RE ENEM
1.0000 | ENEMA | Freq: Once | RECTAL | Status: AC | PRN
  Administered 2024-05-21: 1 via RECTAL
  Filled 2024-05-17: qty 1

## 2024-05-17 MED ORDER — GUAIFENESIN-DM 100-10 MG/5ML PO SYRP: 5.0000 mL | ORAL_SOLUTION | Freq: Four times a day (QID) | ORAL | Status: DC | PRN

## 2024-05-17 MED ORDER — PROCHLORPERAZINE MALEATE 5 MG PO TABS: 5.0000 mg | ORAL_TABLET | Freq: Four times a day (QID) | ORAL | Status: DC | PRN

## 2024-05-17 MED ORDER — PROCHLORPERAZINE 25 MG RE SUPP: 12.5000 mg | Freq: Four times a day (QID) | RECTAL | Status: DC | PRN

## 2024-05-17 MED ORDER — DIPHENHYDRAMINE HCL 25 MG PO CAPS
25.0000 mg | ORAL_CAPSULE | Freq: Four times a day (QID) | ORAL | Status: DC | PRN
  Filled 2024-05-17: qty 1

## 2024-05-17 MED ORDER — ROSUVASTATIN CALCIUM 5 MG PO TABS
10.0000 mg | ORAL_TABLET | Freq: Every day | ORAL | Status: DC
  Administered 2024-05-17 – 2024-06-08 (×23): 10 mg via ORAL
  Filled 2024-05-17 (×23): qty 2

## 2024-05-17 MED ORDER — CLOPIDOGREL BISULFATE 75 MG PO TABS
75.0000 mg | ORAL_TABLET | Freq: Every day | ORAL | Status: AC
  Administered 2024-05-18 – 2024-06-06 (×20): 75 mg via ORAL
  Filled 2024-05-17 (×20): qty 1

## 2024-05-17 MED ORDER — ADULT MULTIVITAMIN W/MINERALS CH
1.0000 | ORAL_TABLET | Freq: Every day | ORAL | Status: DC
  Administered 2024-05-18 – 2024-06-09 (×23): 1 via ORAL
  Filled 2024-05-17 (×25): qty 1

## 2024-05-17 MED ORDER — ENOXAPARIN SODIUM 40 MG/0.4ML IJ SOSY
40.0000 mg | PREFILLED_SYRINGE | INTRAMUSCULAR | Status: DC
  Administered 2024-05-17 – 2024-06-07 (×22): 40 mg via SUBCUTANEOUS
  Filled 2024-05-17 (×22): qty 0.4

## 2024-05-17 MED ORDER — PROCHLORPERAZINE EDISYLATE 10 MG/2ML IJ SOLN: 5.0000 mg | Freq: Four times a day (QID) | INTRAMUSCULAR | Status: DC | PRN

## 2024-05-17 MED ORDER — TAMSULOSIN HCL 0.4 MG PO CAPS
0.4000 mg | ORAL_CAPSULE | Freq: Every day | ORAL | Status: DC
Start: 2024-05-18 — End: 2024-05-27
  Administered 2024-05-18 – 2024-05-27 (×10): 0.4 mg via ORAL
  Filled 2024-05-17 (×10): qty 1

## 2024-05-17 MED ORDER — MEMANTINE HCL 10 MG PO TABS
10.0000 mg | ORAL_TABLET | Freq: Two times a day (BID) | ORAL | Status: DC
  Administered 2024-05-17 – 2024-06-09 (×46): 10 mg via ORAL
  Filled 2024-05-17 (×47): qty 1

## 2024-05-17 MED ORDER — ALUM & MAG HYDROXIDE-SIMETH 200-200-20 MG/5ML PO SUSP: 30.0000 mL | ORAL | Status: DC | PRN

## 2024-05-17 MED ORDER — ASPIRIN 81 MG PO TBEC
81.0000 mg | DELAYED_RELEASE_TABLET | Freq: Every day | ORAL | Status: DC
Start: 2024-05-18 — End: 2024-06-09
  Administered 2024-05-18 – 2024-06-09 (×23): 81 mg via ORAL
  Filled 2024-05-17 (×23): qty 1

## 2024-05-17 MED ORDER — CLOPIDOGREL BISULFATE 75 MG PO TABS
75.0000 mg | ORAL_TABLET | Freq: Every day | ORAL | 11 refills | Status: DC
  Filled 2024-05-17: qty 30, 30d supply, fill #0

## 2024-05-17 MED ORDER — ACETAMINOPHEN 325 MG PO TABS
325.0000 mg | ORAL_TABLET | ORAL | Status: DC | PRN
  Administered 2024-05-27: 650 mg via ORAL
  Filled 2024-05-17: qty 2

## 2024-05-17 MED ORDER — INSULIN ASPART 100 UNIT/ML IJ SOLN: 0.0000 [IU] | Freq: Every day | INTRAMUSCULAR | Status: DC

## 2024-05-17 MED ORDER — INSULIN ASPART 100 UNIT/ML IJ SOLN
0.0000 [IU] | Freq: Three times a day (TID) | INTRAMUSCULAR | Status: DC
  Administered 2024-05-18 – 2024-05-29 (×11): 1 [IU] via SUBCUTANEOUS
  Administered 2024-05-29: 2 [IU] via SUBCUTANEOUS
  Administered 2024-05-30 – 2024-05-31 (×4): 1 [IU] via SUBCUTANEOUS
  Administered 2024-05-31: 2 [IU] via SUBCUTANEOUS
  Administered 2024-06-01 – 2024-06-03 (×4): 1 [IU] via SUBCUTANEOUS
  Administered 2024-06-03 – 2024-06-04 (×2): 2 [IU] via SUBCUTANEOUS
  Administered 2024-06-05 – 2024-06-06 (×5): 1 [IU] via SUBCUTANEOUS
  Administered 2024-06-07 – 2024-06-08 (×2): 2 [IU] via SUBCUTANEOUS
  Administered 2024-06-08 – 2024-06-09 (×2): 1 [IU] via SUBCUTANEOUS

## 2024-05-17 MED ORDER — BISACODYL 10 MG RE SUPP
10.0000 mg | Freq: Every day | RECTAL | Status: DC | PRN
  Administered 2024-05-21: 10 mg via RECTAL
  Filled 2024-05-17: qty 1

## 2024-05-17 MED ORDER — MELATONIN 5 MG PO TABS: 5.0000 mg | ORAL_TABLET | Freq: Every evening | ORAL | Status: DC | PRN

## 2024-05-17 MED ORDER — PANTOPRAZOLE SODIUM 40 MG PO TBEC
40.0000 mg | DELAYED_RELEASE_TABLET | Freq: Every day | ORAL | Status: DC
Start: 2024-05-18 — End: 2024-06-09
  Administered 2024-05-18 – 2024-06-09 (×22): 40 mg via ORAL
  Filled 2024-05-17 (×22): qty 1

## 2024-05-17 NOTE — Discharge Summary (Signed)
 [facility] Discharge Summary   [name] [name] 03/20/1946 78 y.o. 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  Thoracic aortic aneurysm repair with Gore conformable CTAG 40 x 40 x 10 cm 2.  Endovascular repair of juxtarenal aortic aneurysm with main body right 37 x 20 x 160 cm Gore excluder thoracoabdominal branched endoprosthesis extended with 23 x 14 x 16 cm Gore excluder and contralateral left limb 16 x 13.5 cm 3.  Stent of celiac artery with 9 x 59 mm VBX, stent of SMA with 9 x 79 mm VBX, stent left renal artery with two 7 x 79 mm VBX, stent of right renal artery with 7 x 59 and 7 x 79 mm VBX 4.  Open exposure left axillary artery 5.  Percutaneous ultrasound-guided cannulation and proglide closure bilateral common femoral arteries 6.  Intravascular ultrasound of aorta  The pt tolerated the procedure well and was transported to the PACU in good condition.  He was admitted to the ICU overnight for spinal drain management.  POD 1- He was progressing well without any pain.  He was moving all extremities equally with intact sensation.  His left axillary incision was soft without hematoma.  Bilateral groin access sites were intact without hematoma.  He had palpable radial and DP/PT pulses.  His spinal drain was capped since pressures were low.  He had good urinary output with a increase in serum creatinine at 1.54.  He was  continued on IV fluids and Foley was kept in place.  POD 2- All incisions were intact without bleeding or hematoma.  Bilateral upper and lower extremities remained sensorimotor intact.  He was febrile overnight with a Tmax of 102F.  Creatinine improved to 1.27.  His Foley catheter was removed.  His arterial line and central line was also removed.  He was cleared to mobilize with capped spinal drain.  Subcutaneous heparin  was held in anticipation of spinal drain removal  POD 3- His fever was downtrending with a Tmax of 100.  Leukocytosis was improved at 11.4.  He remained neurologically at baseline.  Spinal drain was removed by anesthesia without complications.  POD 4- He remained neurologically at baseline after spinal drain removal.  He was mobilizing well with PT/OT.  All incisions were intact and dry without bleeding or hematoma.  He was tolerating a normal diet.  Physical therapy recommended CIR after discharge. Transferred from the ICU to the progressive floor.  POD 5- Serum creatinine significantly improved to 1.19.  He was encouraged to use incentive spirometer for Tmax 100.  He remained neurologically intact.  He had palpable radial and DP/PT pulses.  Metformin continued on hold due to large contrast load during surgery.  POD 6- He was evaluated by CIR with no concerns for slight left leg weakness.  He was otherwise neurologically intact.  Continues to have palpable radial and DP pulses.  MRI brain was ordered which demonstrated acute ischemic nonhemorrhagic right cerebellar infarct  POD 7- His exam remained the same with intact motor and sensation of bilateral upper and lower  extremities and noticeable left lower extremity weakness.  His extremities were well-perfused with palpable pulses.  Neurology was consulted for input of cerebellar infarct.  MRI spine was obtained which demonstrated no spinal cord infarct.  MRI head and neck was negative for large vessel occlusion or hemodynamically  significant stenosis.   POD 8- His exam remained unchanged with some left lower extremity weakness.  All of his incisions were intact and dry.  He was mobilizing well with PT/OT.  Neurology ordered CTA head/neck to rule out significant carotid stenosis.  He was started on dual antiplatelet therapy.  POD 9- Neurology recommended dual antiplatelet therapy for 1 month for acute right cerebellar infarct.  CTA head/neck was negative for hemodynamically significant carotid stenosis.  CTA head did demonstrate chronic small vessel disease.  He remained medically stable and was moving all extremities with continued left lower extremity weakness.  He was tolerating a normal diet.  He was voiding without difficulty.  He was stable for discharge. He was discharged to CIR on 05/17/2024   CBC    Component Value Date/Time   WBC 12.4 (H) 05/11/2024 1443   RBC 3.15 (L) 05/11/2024 1443   HGB 9.2 (L) 05/11/2024 1443   HGB 12.7 (L) 11/14/2020 1031   HCT 28.0 (L) 05/11/2024 1443   HCT 39.1 11/14/2020 1031   PLT 106 (L) 05/11/2024 1443   PLT 157 11/14/2020 1031   MCV 88.9 05/11/2024 1443   MCV 90 11/14/2020 1031   MCH 29.2 05/11/2024 1443   MCHC 32.9 05/11/2024 1443   RDW 15.8 (H) 05/11/2024 1443   RDW 15.6 (H) 11/14/2020 1031   LYMPHSABS 1.0 03/04/2024 0446   LYMPHSABS 1.0 11/14/2020 1031   MONOABS 1.0 03/04/2024 0446   EOSABS 0.2 03/04/2024 0446   EOSABS 0.3 11/14/2020 1031   BASOSABS 0.0 03/04/2024 0446   BASOSABS 0.1 11/14/2020 1031    BMET    Component Value Date/Time   NA 137 05/13/2024 0321   NA 145 (H) 11/14/2020 1031   K 3.8 05/13/2024 0321   CL 107 05/13/2024 0321   CO2 21 (L) 05/13/2024 0321   GLUCOSE 139 (H) 05/13/2024 0321   BUN 16 05/13/2024 0321   BUN 12 11/14/2020 1031   CREATININE 1.30 (H) 05/16/2024 1742   CALCIUM  8.2 (L) 05/13/2024 0321   GFRNONAA 56 (L) 05/16/2024 1742   GFRAA >60 05/09/2020 1228       Discharge Instructions     Call MD for:  redness, tenderness,  or signs of infection (pain, swelling, redness, odor or green/yellow discharge around incision site)   Complete by: As directed    Call MD for:  severe uncontrolled pain   Complete by: As directed    Call MD for:  temperature >100.4   Complete by: As directed    Diet - low sodium heart healthy   Complete by: As directed    Increase activity slowly   Complete by: As directed    No wound care   Complete by: As directed        Discharge Diagnosis:  Dissecting aortic aneurysm, 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 [S01.109, Z86.79] 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]  Secondary Diagnosis: Patient Active Problem List   Diagnosis Date Noted   Status post abdominal aortic aneurysm (AAA) repair 05/08/2024   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   Acute encephalopathy 03/01/2024   Allergic rhinitis 09/21/2020   Benign prostatic hyperplasia 09/21/2020   Body mass index (BMI) 31.0-31.9, adult 09/21/2020   Cerebral  infarction (HCC) 09/21/2020   Diabetic foot (HCC) 09/21/2020   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   Dysarthria 09/21/2020   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   Gait difficulty 09/21/2020   History of psychiatric disorder 09/21/2020   History of colonic polyps 09/21/2020   Pure hypercholesterolemia 09/21/2020   Unspecified thoracic, thoracolumbar and lumbosacral intervertebral disc disorder 09/21/2020   Hypertension    Hyperlipidemia    GERD (gastroesophageal reflux disease)    DM (diabetes mellitus) (HCC)    CKD (chronic kidney disease), stage III (HCC)    Normocytic anemia    Thrombocytopenia    UTI (urinary tract infection)    AMS (altered mental status)    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   History of repair of dissecting thoracic aneurysm    Abdominal distension    Pulmonary embolus (HCC) 10/19/2019   CVA (cerebral vascular accident) (HCC) 10/19/2019    AKI (acute kidney injury) 10/19/2019   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 ago, as a baseline    Hyperlipidemia    takes Crestor  daily   Hypertension    takes Amlodipine  daily and Lotensin  as well   Stroke [facility])      Allergies as of 05/17/2024       Reactions   Lyrica [pregabalin] Nausea Only, Other (See Comments)   Hallucinations Dizziness    Sulfa Antibiotics Other (See Comments)   Unknown reaction        Medication List     TAKE these medications    acetaminophen  500 MG tablet Commonly known as: TYLENOL  Take 500 mg by mouth 2 (two) times daily.   amLODipine  10 MG tablet Commonly known as: NORVASC  Take 10 mg by mouth daily.   aspirin  EC 81 MG tablet Take 81 mg by mouth daily.   carvedilol  25 MG tablet Commonly known as: COREG  Take 1 tablet (25 mg total) by mouth 2 (two) times daily with a meal.   clopidogrel 75 MG tablet Commonly known as: PLAVIX Take 1 tablet (75 mg total) by mouth daily. Start taking on: May 18, 2024   hydrALAZINE  10 MG tablet Commonly known as: APRESOLINE  Take 1 tablet (10 mg total) by mouth 3 (three) times daily.   losartan 50 MG tablet Commonly known as: COZAAR Take 50 mg by mouth daily.   memantine  10 MG tablet Commonly known as: NAMENDA  TAKE 1 TABLET BY MOUTH TWICE DAILY   Mens 50+ Multivitamin Tabs Take 1 tablet by mouth daily.   metFORMIN 500 MG  tablet Commonly known as: GLUCOPHAGE Take 500 mg by mouth 2 (two) times daily.   pantoprazole  40 MG tablet Commonly known as: PROTONIX  Take 1 tablet (40 mg total) by mouth daily.   rosuvastatin  10 MG tablet Commonly known as: CRESTOR  Take 10 mg by mouth at bedtime.   tamsulosin  0.4 MG Caps capsule Commonly  known as: FLOMAX  Take 1 capsule (0.4 mg total) by mouth daily.        Discharge Instructions:   [facility]  Discharge Instructions Endovascular Aortic Aneurysm Repair  Please refer to the following instructions for your post-procedure care. Your surgeon or Physician Assistant will discuss any changes with you.  Activity  You are encouraged to walk as much as you can. You can slowly return to normal activities but must avoid strenuous activity and heavy lifting until your doctor tells you it's OK. Avoid activities such as vacuuming or swinging a gold club. It is normal to feel tired for several weeks after your surgery. Do not drive until your doctor gives the OK and you are no longer taking prescription pain medications. It is also normal to have difficulty with sleep habits, eating, and bowel movements after surgery. These will go away with time.  Bathing/Showering  You may shower after you go home. If you have an incision, do not soak in a bathtub, hot tub, or swim until the incision heals completely.  Incision Care  Shower every day. Clean your incision with mild soap and water. Pat the area dry with a clean towel. You do not need a bandage unless otherwise instructed. Do not apply any ointments or creams to your incision. If you clothing is irritating, you may cover your incision with a dry gauze pad.  Diet  Resume your normal diet. There are no special food restrictions following this procedure. A low fat/low cholesterol diet is recommended for all patients with vascular disease. In order to heal from your surgery, it is CRITICAL to get  adequate nutrition. Your body requires vitamins, minerals, and protein. Vegetables are the best source of vitamins and minerals. Vegetables also provide the perfect balance of protein. Processed food has little nutritional value, so try to avoid this.  Medications  Resume taking all of your medications unless your doctor or Physician Assistant tells you not to. If your incision is causing pain, you may take over-the-counter pain relievers such as acetaminophen  (Tylenol ). If you were prescribed a stronger pain medication, please be aware these medications can cause nausea and constipation. Prevent nausea by taking the medication with a snack or meal. Avoid constipation by drinking plenty of fluids and eating foods with a high amount of fiber, such as fruits, vegetables, and grains. Do not take Tylenol  if you are taking prescription pain medications.   Follow up  Our office will schedule a follow-up appointment with a C.T. scan 3-4 weeks after your surgery.  Please call us  immediately for any of the following conditions  Severe or worsening pain in your legs or feet or in your abdomen back or chest. Increased pain, redness, drainage (pus) from your incision sit. Increased abdominal pain, bloating, nausea, vomiting or persistent diarrhea. Fever of 101 degrees or higher. Swelling in your leg (s),  Reduce your risk of vascular disease  Stop smoking. If you would like help call [facility] at 1-800-QUIT-NOW (225-275-2679) or [place] at [phone]. Manage your cholesterol Maintain a desired weight Control your diabetes Keep your blood pressure down  If you have questions, please call the office at [phone].   Disposition: CIR  Patient's condition: is Good  Follow up: 1. Dr. Sheree in 4 weeks with CTA protocol   Ahmed Holster, PA-C [facility] [phone] 05/17/2024  10:12 AM   - For VQI Registry use - Post-op:  Time to Extubation: [x]  In OR, [ ]  <  12  hrs, [ ]  12-24 hrs, [ ]  >=24 hrs Vasopressors Req. Post-op: No MI: No., [ ]  Troponin only, [ ]  EKG or Clinical New Arrhythmia: No CHF: No ICU Stay: 4 days Transfusion: No     If yes,  units given Complications: Resp failure: No., [ ]  Pneumonia, [ ]  Ventilator Chg in renal function: No., [ ]  Inc. Cr > 0.5, [ ]  Temp. Dialysis,  [ ]  Permanent dialysis Leg ischemia: No., no Surgery needed, [ ]  Yes, Surgery needed,  [ ]  Amputation Bowel ischemia: No., [ ]  Medical Rx, [ ]  Surgical Rx Wound complication: No., [ ]  Superficial separation/infection, [ ]  Return to OR Return to OR: No  Return to OR for bleeding: No Stroke: Yes.  , [x ] Minor, [ ]  Major  Discharge medications: Statin use:  Yes  ASA use:  Yes  Plavix use:  Yes  Beta blocker use:  Yes  ARB use:  Yes ACEI use:  No CCB use:  Yes

## 2024-05-17 NOTE — Progress Notes (Addendum)
 Progress Note    05/17/2024 9:05 AM 9 Days Post-Op  Subjective: No complaints   Vitals:   05/17/24 0334 05/17/24 0817  BP: (!) 166/77 (!) 155/83  Pulse: 70 69  Resp: 16 20  Temp: 98.8 F (37.1 C) 98.7 F (37.1 C)  SpO2: 96% 98%    Physical Exam: General: Laying down in bed, alert and oriented x 3 Cardiac: Regular Lungs: Nonlabored Incisions: Left axillary incision dry and intact.  Bilateral groin access sites intact without hematoma Extremities: 2+ radial and PT pulses bilaterally.  Intact motor and sensation of bilateral upper and lower extremities with unchanged weakness of the left lower extremity   CBC    Component Value Date/Time   WBC 12.4 (H) 05/11/2024 1443   RBC 3.15 (L) 05/11/2024 1443   HGB 9.2 (L) 05/11/2024 1443   HGB 12.7 (L) 11/14/2020 1031   HCT 28.0 (L) 05/11/2024 1443   HCT 39.1 11/14/2020 1031   PLT 106 (L) 05/11/2024 1443   PLT 157 11/14/2020 1031   MCV 88.9 05/11/2024 1443   MCV 90 11/14/2020 1031   MCH 29.2 05/11/2024 1443   MCHC 32.9 05/11/2024 1443   RDW 15.8 (H) 05/11/2024 1443   RDW 15.6 (H) 11/14/2020 1031   LYMPHSABS 1.0 03/04/2024 0446   LYMPHSABS 1.0 11/14/2020 1031   MONOABS 1.0 03/04/2024 0446   EOSABS 0.2 03/04/2024 0446   EOSABS 0.3 11/14/2020 1031   BASOSABS 0.0 03/04/2024 0446   BASOSABS 0.1 11/14/2020 1031    BMET    Component Value Date/Time   NA 137 05/13/2024 0321   NA 145 (H) 11/14/2020 1031   K 3.8 05/13/2024 0321   CL 107 05/13/2024 0321   CO2 21 (L) 05/13/2024 0321   GLUCOSE 139 (H) 05/13/2024 0321   BUN 16 05/13/2024 0321   BUN 12 11/14/2020 1031   CREATININE 1.30 (H) 05/16/2024 1742   CALCIUM  8.2 (L) 05/13/2024 0321   GFRNONAA 56 (L) 05/16/2024 1742   GFRAA >60 05/09/2020 1228    INR    Component Value Date/Time   INR 1.2 05/11/2024 1443     Intake/Output Summary (Last 24 hours) at 05/17/2024 0905 Last data filed at 05/16/2024 1158 Gross per 24 hour  Intake --  Output 500 ml  Net -500 ml       Assessment/Plan:  78 y.o. male is 9 days postop, s/p: 1.  Thoracic aortic aneurysm repair with Gore conformable CTAG 40 x 40 x 10 cm 2.  Endovascular repair of juxtarenal aortic aneurysm with main body right 37 x 20 x 160 cm Gore excluder thoracoabdominal branched endoprosthesis extended with 23 x 14 x 16 cm Gore excluder and contralateral left limb 16 x 13.5 cm 3.  Stent of celiac artery with 9 x 59 mm VBX, stent of SMA with 9 x 79 mm VBX, stent left renal artery with two 7 x 79 mm VBX, stent of right renal artery with 7 x 59 and 7 x 79 mm VBX 4.  Open exposure left axillary artery 5.  Percutaneous ultrasound-guided cannulation and proglide closure bilateral common femoral arteries 6.  Intravascular ultrasound of aorta   - He is doing well this morning without any complaints.  He denies any pain.  He denies any worsening weakness -Left axillary incision is intact and dry.  Bilateral groin access sites are soft without hematoma -Bilateral upper and lower extremities are well-perfused with 2+ radial PT pulses -Neurology was consulted for left-sided weakness and the patient was found to  have an acute nonhemorrhagic right cerebellar infarct. CTA head/neck demonstrates stable appearance of acute right cerebellar infarct.  He does have chronic small vessel disease.  There is no significant carotid stenosis on imaging -Neurology has recommended DAPT for a total of 1 month.  Continue aspirin  and Plavix and then transition to aspirin  alone after 1 month -His exam remains the same with intact motor and sensation of bilateral upper and lower extremities.  He does have persistent weakness of the left lower extremity. -He is medically stable for discharge to CIR today.  Will place discharge orders   Ahmed Holster, PA-C [facility] [phone] 05/17/2024 9:05 AM   I have seen and evaluated the patient. I agree with the PA note as documented above.  Appreciate neurology input for  right cerebellum infarct noted on MRI.  Plan aspirin  Plavix.  CTA neck shows no large vessel occlusive disease.  Plan transfer to CIR today.  Lonni DOROTHA Gaskins, MD [facility] [place] Office: [phone]

## 2024-05-17 NOTE — Progress Notes (Signed)
 STROKE TEAM PROGRESS NOTE   INTERVAL HISTORY No family is at the bedside.  No acute event overnight. CTA head and neck no R CCA stenosis. On DAPT.   Vitals:   05/16/24 2315 05/17/24 0334 05/17/24 0817 05/17/24 1100  BP: (!) 151/86 (!) 166/77 (!) 155/83 (!) 153/93  Pulse: 68 70 69 68  Resp: 16 16 20 20   Temp: 98.7 F (37.1 C) 98.8 F (37.1 C) 98.7 F (37.1 C) 98.1 F (36.7 C)  TempSrc: Oral Oral Oral Oral  SpO2: 98% 96% 98% 100%  Weight:      Height:       CBC:  Recent Labs  Lab 05/11/24 0235 05/11/24 1443  WBC 11.4* 12.4*  HGB 8.5* 9.2*  HCT 25.7* 28.0*  MCV 87.7 88.9  PLT 95* 106*   Basic Metabolic Panel:  Recent Labs  Lab 05/11/24 0235 05/13/24 0321 05/16/24 1742  NA 138 137  --   K 3.7 3.8  --   CL 107 107  --   CO2 22 21*  --   GLUCOSE 151* 139*  --   BUN 15 16  --   CREATININE 1.49* 1.19 1.30*  CALCIUM  8.1* 8.2*  --    Lipid Panel:  Recent Labs  Lab 05/16/24 0330  CHOL 105  TRIG 99  HDL 24*  CHOLHDL 4.4  VLDL 20  LDLCALC 61    IMAGING past 24 hours CT ANGIO HEAD NECK W WO CM Result Date: 05/17/2024 CLINICAL DATA:  Initial evaluation for stroke. EXAM: CT ANGIOGRAPHY HEAD AND NECK WITH AND WITHOUT CONTRAST TECHNIQUE: Multidetector CT imaging of the head and neck was performed using the standard protocol during bolus administration of intravenous contrast. Multiplanar CT image reconstructions and MIPs were obtained to evaluate the vascular anatomy. Carotid stenosis measurements (when applicable) are obtained utilizing NASCET criteria, using the distal internal carotid diameter as the denominator. RADIATION DOSE REDUCTION: This exam was performed according to the departmental dose-optimization program which includes automated exposure control, adjustment of the mA and/or kV according to patient size and/or use of iterative reconstruction technique. CONTRAST:  75mL OMNIPAQUE  IOHEXOL  350 MG/ML SOLN COMPARISON:  Prior exams from 05/15/2024. FINDINGS: CT HEAD  FINDINGS Brain: Age-related cerebral atrophy with mild chronic small vessel ischemic disease. Previously identified small right cerebellar infarct is grossly similar. Few small remote occipital infarcts noted. No acute intracranial hemorrhage. No acute large vessel territory infarct. No mass lesion or midline shift. Ventricular prominence related global parenchymal volume loss of hydrocephalus. Slight asymmetric prominence of the extra-axial space overlying the left cerebral convexity but with no identifiable collection. Vascular: No abnormal hyperdense vessel. Calcified atherosclerosis present at skull base. Skull: Scalp soft tissues and calvarium demonstrate no new finding. Sinuses/Orbits: Globes orbital soft tissues within normal limits. Paranasal sinuses and mastoid air cells remain largely clear. Other: None. Review of the MIP images confirms the above findings CTA NECK FINDINGS Aortic arch: Stent endograft partially visualized within the distal aortic arch. Arch itself is somewhat ectatic measuring up to 3.3 cm. No stenosis about the origin the great vessels. Right carotid system: Right common and internal carotid arteries are diffusely tortuous and ectatic. No dissection. Mild atheromatous change about the right carotid bulb without stenosis. Previously question abnormality on prior MRA was consistent with artifact, with stenosis seen about the proximal right carotid artery system. Left carotid system: Left common and internal carotid arteries are tortuous and ectatic. No dissection. Mild atheromatous change about the left carotid bulb without stenosis. Vertebral arteries: Both  vertebral arteries arise from subclavian arteries. Left vertebral artery dominant. Vertebral arteries are patent without stenosis or dissection. Skeleton: No worrisome osseous lesions. Other neck: No other acute finding. Upper chest: No other acute finding. Review of the MIP images confirms the above findings CTA HEAD FINDINGS Anterior  circulation: Mild atheromatous change about the carotid siphons without stenosis. A1 segments, anterior communicating artery complex common anterior cerebral arteries widely patent. No M1 stenosis or occlusion. Distal MCA branches perfused and symmetric. Posterior circulation: Both V4 segments patent without stenosis. Right PICA patent. Left PICA origin not seen. Basilar patent without stenosis. Superior cerebral arteries patent bilaterally. Both PCAs primarily supplied via the basilar well perfused or distal aspects. Venous sinuses: Patent allowing for timing the contrast bolus. Anatomic variants: As above.  No aneurysm. Review of the MIP images confirms the above findings IMPRESSION: CT HEAD: 1. Grossly stable appearance of previously identified small acute ischemic nonhemorrhagic right cerebellar infarct. 2. Age-related cerebral atrophy with mild chronic small vessel ischemic disease, with a few small remote occipital infarcts CTA HEAD AND NECK: 1. Negative CTA for large vessel occlusion or other emergent finding. 2. Mild atheromatous change about the carotid bifurcations and carotid siphons without hemodynamically significant stenosis. 3. Diffuse tortuosity and ectasia of the major arterial vasculature of the head and neck, suggesting chronic underlying hypertension. 4. Previously noted attenuation of the proximal right CCA on prior MRA was consistent with artifact. No stenosis in this region. Electronically Signed   By: Morene Hoard M.D.   On: 05/17/2024 03:37    PHYSICAL EXAM Physical Exam  Constitutional: Appears well-developed and well-nourished.  Psych: Affect appropriate to situation Eyes: Normal external eye and conjunctiva. HENT: Normocephalic, no lesions, without obvious abnormality.   Musculoskeletal-no joint tenderness, deformity or swelling Cardiovascular: Normal rate and regular rhythm.  Respiratory: Effort normal, non-labored breathing saturations WNL GI: Soft.  No distension.  There is no tenderness.  Skin: WDI   Neuro:  awake, alert, eyes open, orientated to age, place, time. No aphasia, fluent language but paucity of speech, following all simple commands. Able to name and repeat. No gaze palsy but chronic mild disconjugate gaze with left eye outwards deviation, likely consistent with amblyopia, no nystagmus. Pt tracking bilaterally, visual field full. No facial droop. Tongue midline. Bilateral UEs 4/5, no drift. Bilaterally LEs 4/5 proximal, distally ankle DF/PF, 5/5 R and 4/5 L. Sensation symmetrical bilaterally, R FTN slight dysmetria with slow action, gait not tested.      ASSESSMENT/PLAN [name] is a 78 y.o. male  with history of hyperlipidemia, hypertension, previous stroke, diabetes who was admitted for thoracic aortic aneurysm repair.  Following surgery, was noticed that he was dragging his left leg and an MRI was obtained which demonstrates a left cerebellar infarct.  He was admitted in July with bilateral lower extremity weakness.  Which is felt to be possibly due to general medical condition.  His weakness now, however is significantly worse.   Stroke: right cerebellar small deep infarct, likely small vessel disease MRI  right cerebellar non hemorrhagic infarcts MRA head/neck Asymmetric attenuation of the proximal right CCA.  CTA head & neck Mild atheromatous change about the carotid bifurcations and carotid siphons without hemodynamically significant stenosis. LE venous Doppler no acute DVT, age indeterminate DVT involving the left common femoral and left popliteal veins, and age indeterminate, occlusive DVT involving the left posterior tibial and left peroneal  veins.  2D Echo EF 60 to 65% LDL 61 HgbA1c 5.8 VTE prophylaxis -  heparin  aspirin  81 mg daily prior to admission, now on aspirin  81 mg daily and clopidogrel 75 mg daily for 3 weeks then ASA alone  Therapy recommendations:  CIR Disposition:  pending  AAA s/p repair Hx of type B  aortic dissection repair in 2021 Found to have bilateral lower extremity weakness and CTA showed recurrent aortic dissection Now status post surgical repair by Dr. Sheree MRI C/T spine showed no spinal cord infarct Vascular surgery primary On DAPT now  History of stroke 09/2019 bilateral PE on heparin  IV, status post AAA repair, found to have altered mental status.  MRI showed embolic shower.  MRI head moderate left P1 stenosis, carotid Doppler negative.  EF 60 to 65%.  LDL 72, A1c 6.7.  Discharged on anticoagulation.  Hypertension Home meds:  amlodipine , coreg , hydralazine ,  Stable Avoid low BP Long-term BP goal normotensive  Hyperlipidemia Home meds:  crestor  10, resumed in hospital LDL 61, goal < 70 Continue statin at discharge  Diabetes type II Controlled Home meds:  metformin HgbA1c 5.8, goal < 7.0 CBGs SSI Close PCP follow-up for better DM control  Other Stroke Risk Factors Advanced Age  Other Active Problems Leukocytosis WBC 12.1-11.4-12.4 Anemia, hemoglobin 9.1-8.5-9.2 Thrombocytopenia platelet of 100-93-95-106 AKI, creatinine 1.27-1.49-1.19-1.30  Hospital day # 9  Neurology will sign off. Please call with questions. Pt will follow up with stroke clinic NP Jessica at [facility] in about 4 weeks. Thanks for the consult.   Ary Cummins, MD PhD Stroke Neurology 05/17/2024 3:11 PM   To contact Stroke Continuity provider, please refer to [email or web address]. After hours, contact General Neurology

## 2024-05-17 NOTE — Progress Notes (Addendum)
 VASCULAR LAB    Bilateral lower extremity venous duplex has been performed.  See CV proc for preliminary results.  Relayed results to Dr. Jerri via secure chat  RACHEL PELLET, RVT 05/17/2024, 3:53 PM

## 2024-05-17 NOTE — Progress Notes (Signed)
 Attempted to call report to 4W CIR.  No answer at this time.  Will try again later.

## 2024-05-17 NOTE — Plan of Care (Signed)

## 2024-05-17 NOTE — Progress Notes (Signed)
 Attempted to call report, no answer at the desk.  Will try again.

## 2024-05-17 NOTE — Progress Notes (Signed)
 PMR Admission Coordinator Pre-Admission Assessment   Patient: [name] [name] is an 78 y.o., male MRN: [id number] DOB: [birth year] Height: 5' 11 (180.3 cm) Weight: 85.8 kg                                                                                                                          Insurance Information HMO: HMO/POS    PPO:      PCP:      IPA:      80/20:      OTHER:  PRIMARY: UHC Medicare      Policy#: [id number] ; Medicare [id number]     Subscriber: pt CM Name: UM dept      Phone#: [phone] option 3     Fax#: [phone] Pre-Cert#: [id number] Auth for CIR from [facility] medicare with Jon  for admit 05/14/24 with next review date 05/21/24.  Updates due to um dept at fax listed above.      Employer:  Benefits:  Phone #: [phone]     Name: 9/25 Eff. Date: 08/21/23     Deduct: none      Out of Pocket Max: $3900      Life Max: none  CIR: $300 co pay per day days 1 until 5      SNF: no co pay per day days 1 until 20; $203 co pay per day days 21 until 100 Outpatient: $ 20 per visit     Co-Pay:  Home Health: 100%      Co-Pay:  DME: 80%     Co-Pay: 20% Providers: in network  SECONDARY: none      Policy#:       Phone#:    Artist:       Phone#:    The Data processing manager" for patients in Inpatient Rehabilitation Facilities with attached "Privacy Act Statement-Health Care Records" was provided and verbally reviewed with: Patient   Emergency Contact Information Contact Information       Name Relation Home Work Mobile    [name],[name] Spouse [phone]   [phone]         Other Contacts   None on File      Current Medical History  Patient Admitting Diagnosis: debility secondary to thoracoabdominal aorta repair/vascular stenting   History of Present Illness: Pt is a 78 yo male with history of  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 Recent admit with bilateral LE weakness ultimately found to have diffuse encephalopathy. Discharged to [facility] 03/05/24.     He Presented to [facility] on 05/08/24 for repair of a thoracoabdominal aneurysm with stenting of multiple associated vessels. Postoperative temp with leukocytosis, thrombocytopenia and AKI. Neurology was consulted for left-sided weakness and MRI brain demonstrating acute ischemic right cerebellar infarct. Neurology does not feel that his left-sided weakness is due to cerebellar infarct.  MRI C/T spine showed no spinal cord infarctMRA head/neck with  Asymmetric attenuation of the proximal right CCA. 2D Echo EF 60 to 65% CTA of the head/neck showed no LVO. Pt. Seen by PT/OT and they recommend CIR to assist return to PLOF.        Patient's medical record from [facility] has been reviewed by the rehabilitation admission coordinator and physician.   Past Medical History      Past Medical History:  Diagnosis Date   Chronic back pain      stenosis   Diabetes mellitus without complication (HCC)     Enlarged prostate     GERD (gastroesophageal reflux disease)      occasionally will take a zantac(maybe once a month)   History of colon polyps     History of kidney stones     History of stress test      done 10 yrs. ago, as a baseline    Hyperlipidemia      takes Crestor  daily   Hypertension      takes Amlodipine  daily and Lotensin  as well   Stroke [facility])          Has the patient had major surgery during 100 days prior to admission? Yes   Family History  family history is not on file.   Current Medications   Current Medications    Current Facility-Administered Medications:    0.9 %  sodium chloride  infusion, 10 mL/hr, Intravenous, Once, Eveland, Matthew, PA-C   acetaminophen  (TYLENOL ) tablet 325-650 mg, 325-650 mg, Oral, Q4H PRN, 650 mg at 05/10/24 2257 **OR** acetaminophen  (TYLENOL ) suppository 325-650  mg, 325-650 mg, Rectal, Q4H PRN, Eveland, Matthew, PA-C   aspirin  EC tablet 81 mg, 81 mg, Oral, Daily, Eveland, Matthew, PA-C, 81 mg at 05/14/24 9160   bisacodyl  (DULCOLAX) suppository 10 mg, 10 mg, Rectal, Daily PRN, Eveland, Matthew, PA-C   docusate sodium  (COLACE) capsule 100 mg, 100 mg, Oral, Daily, Eveland, Matthew, PA-C, 100 mg at 05/14/24 9160   heparin  injection 5,000 Units, 5,000 Units, Subcutaneous, Q8H, Magda Debby SAILOR, MD, 5,000 Units at 05/14/24 1426   HYDROmorphone  (DILAUDID ) injection 0.5 mg, 0.5 mg, Intravenous, Q3H PRN, Eveland, Matthew, PA-C, 0.5 mg at 05/08/24 2005   insulin  aspart (novoLOG ) injection 0-9 Units, 0-9 Units, Subcutaneous, TID WC, Eveland, Matthew, PA-C, 1 Units at 05/14/24 1202   memantine  (NAMENDA ) tablet 10 mg, 10 mg, Oral, BID, Eveland, Matthew, PA-C, 10 mg at 05/14/24 9160   metoprolol  tartrate (LOPRESSOR ) injection 2.5-5 mg, 2.5-5 mg, Intravenous, Q2H PRN, Eveland, Matthew, PA-C   multivitamin with minerals tablet 1 tablet, 1 tablet, Oral, Daily, Eveland, Matthew, PA-C, 1 tablet at 05/14/24 9160   Oral care mouth rinse, 15 mL, Mouth Rinse, PRN, Eveland, Matthew, PA-C   oxyCODONE -acetaminophen  (PERCOCET/ROXICET) 5-325 MG per tablet 1-2 tablet, 1-2 tablet, Oral, Q4H PRN, Eveland, Matthew, PA-C, 1 tablet at 05/09/24 2324   pantoprazole  (PROTONIX ) EC tablet 40 mg, 40 mg, Oral, Daily, Eveland, Matthew, PA-C, 40 mg at 05/14/24 0839   phenol (CHLORASEPTIC) mouth spray 1 spray, 1 spray, Mouth/Throat, PRN, Eveland, Matthew, PA-C   polyethylene glycol (MIRALAX  / GLYCOLAX ) packet 17 g, 17 g, Oral, Daily PRN, Eveland, Matthew, PA-C, 17 g at 05/13/24 1607   potassium chloride  SA (KLOR-CON  M) CR tablet 40-60 mEq, 40-60 mEq, Oral, Daily PRN, Eveland, Matthew, PA-C, 40 mEq at 05/10/24 9471   rosuvastatin  (CRESTOR ) tablet 10 mg, 10 mg, Oral, QHS, [name], PA-C, 10 mg at 05/13/24 2207   tamsulosin  (FLOMAX )  capsule 0.4 mg, 0.4 mg, Oral, Daily, Eveland, Matthew, PA-C,  0.4 mg at 05/14/24 9160     Patients Current Diet:  Diet Order                  Diet heart healthy/carb modified Room service appropriate? No; Fluid consistency: Thin  Diet effective now                       Precautions / Restrictions Precautions Precautions: Fall, Other (comment) Precaution/Restrictions Comments: incision near L clavicle (axillary artery) and bil groins Restrictions Weight Bearing Restrictions Per Provider Order: No    Has the patient had 2 or more falls or a fall with injury in the past year?No   Prior Activity Level Community (5-7x/wk): Mod I with rollator   Prior Functional Level Prior Function Prior Level of Function : Independent/Modified Independent Mobility Comments: rollator to ambulate ADLs Comments: ind, plays piano   Self Care: Did the patient need help bathing, dressing, using the toilet or eating?  Needed some help   Indoor Mobility: Did the patient need assistance with walking from room to room (with or without device)? Independent   Stairs: Did the patient need assistance with internal or external stairs (with or without device)? Independent   Functional Cognition: Did the patient need help planning regular tasks such as shopping or remembering to take medications? Needed some help   Patient Information Are you of Hispanic, Latino/a,or Spanish origin?: A. No, not of Hispanic, Latino/a, or Spanish origin What is your race?: B. Black or African American Do you need or want an interpreter to communicate with a doctor or health care staff?: 0. No   Patient's Response To:  Health Literacy and Transportation Is the patient able to respond to health literacy and transportation needs?: Yes Health Literacy - How often do you need to have someone help you when you read instructions, pamphlets, or other written material from your doctor or pharmacy?: Never In the past 12 months, has lack of transportation kept you from medical appointments or  from getting medications?: No In the past 12 months, has lack of transportation kept you from meetings, work, or from getting things needed for daily living?: No   Home Assistive Devices / Equipment Home Equipment: Tub bench, Agricultural consultant (2 wheels), Rollator (4 wheels), Hand held shower head, BSC/3in1, Grab bars - toilet, Wheelchair - manual   Prior Device Use: Indicate devices/aids used by the patient prior to current illness, exacerbation or injury? rollator   Current Functional Level Cognition   Orientation Level: Oriented X4    Extremity Assessment (includes Sensation/Coordination)   Upper Extremity Assessment: Right hand dominant, LUE deficits/detail LUE Deficits / Details: AROM shoulder wrist elbow digits present. pt able to compelte hand to mouth, pt able to complete finger to nose accurately LUE Coordination: decreased fine motor  Lower Extremity Assessment: Defer to PT evaluation, LLE deficits/detail LLE Deficits / Details: weakness  noted     ADLs   Overall ADL's : Needs assistance/impaired Eating/Feeding: Set up, Sitting Eating/Feeding Details (indicate cue type and reason): cues to initiate Grooming: Wash/dry face, Set up, Sitting Upper Body Bathing: Moderate assistance Toilet Transfer: +2 for physical assistance, Moderate assistance General ADL Comments: pt transfer from supine to chair this session. pt with cues for weight shifting and tactile cues required     Mobility   Overal bed mobility: Needs Assistance Bed Mobility: Supine to Sit Rolling: Min assist Sidelying to sit: HOB elevated,  Mod assist, +2 for safety/equipment, Used rails General bed mobility comments: Min to ModA to facilitate/sequence LEs off edge of bed initially. requires (A) of the pad to scoot to EOB and for trunk lifting. Posterior and R truncal instability with transfer to EOB.     Transfers   Overall transfer level: Needs assistance Equipment used: Rolling walker (2 wheels) Transfers: Sit  to/from Stand, Bed to chair/wheelchair/BSC Sit to Stand: Mod assist, +2 physical assistance, +2 safety/equipment, From elevated surface Bed to/from chair/wheelchair/BSC transfer type:: Step pivot Step pivot transfers: Mod assist, +2 physical assistance, +2 safety/equipment General transfer comment: Mod assist for boost and balance with cues for technique and anterior weight shift. Slow to rise, pt needs assist to control descent into chair as well. Mod assist +2 to facilitate weight shift, advance LLE and block Lt knee with step pivot transfer to recliner on his L side. Max cues for sequencing. Pt does pick up Lt foot but cannot sequence weight shift and clearance enough to advance on his own. He did tolerate gripping RW with UE but needs assist to move RW appropriately. R lean once seated on chair.     Ambulation / Gait / Stairs / Wheelchair Mobility   Ambulation/Gait Pre-gait activities: Progressed with forward and retrostep. Poor quad control noted Lt , resulting in buckling and hyperextension unless blocked by therapist. He was able to march in place with heavy UE reliance on RW, and bracing on Lt side from therapist.     Posture / Balance Balance Overall balance assessment: Needs assistance Sitting-balance support: Single extremity supported, Feet supported Sitting balance-Leahy Scale: Poor Postural control: Right lateral lean, Posterior lean Standing balance support: Bilateral upper extremity supported Standing balance-Leahy Scale: Poor Standing balance comment: RW and +2 external assist for dynamic standing tasks     Special considerations/ Life events          Previous Home Environment  Living Arrangements: Spouse/significant other  Lives With: Spouse Available Help at Discharge: Family, Available 24 hours/day Type of Home: House Home Layout: Two level, Bed/bath upstairs Alternate Level Stairs-Rails: Right Alternate Level Stairs-Number of Steps: 7+7 with landing Home Access:  Stairs to enter Entrance Stairs-Rails: None Entrance Stairs-Number of Steps: 1 Bathroom Shower/Tub: Psychologist, counselling, Tub/shower unit (walk in shower upstairs) Bathroom Toilet: Handicapped height Bathroom Accessibility: Yes Home Care Services: Yes Type of Home Care Services: Home RN Home Care Agency (if known): [facility] Additional Comments: Enjoys watching TV and walking outside on his driveway   Discharge Living Setting Plans for Discharge Living Setting: Patient's home, House, Lives with (comment) (wife) Type of Home at Discharge: House Discharge Home Layout: Bed/bath upstairs, Two level Alternate Level Stairs-Rails: Right Alternate Level Stairs-Number of Steps: 7 + 7 with landing Discharge Home Access: Stairs to enter Entrance Stairs-Rails: None Entrance Stairs-Number of Steps: 1 Discharge Bathroom Shower/Tub: Walk-in shower Discharge Bathroom Toilet: Handicapped height Does the patient have any problems obtaining your medications?: No   Social/Family/Support Systems Patient Roles: Spouse Contact Information: wife, [name] [name] Anticipated Caregiver: wife Anticipated Industrial/product designer Information: see contacts Ability/Limitations of Caregiver: no limitations, she is 78 yo Caregiver Availability: 24/7 Discharge Plan Discussed with Primary Caregiver: Yes Is Caregiver In Agreement with Plan?: Yes Does Caregiver/Family have Issues with Lodging/Transportation while Pt is in Rehab?: No   Goals Patient/Family Goal for Rehab: supervision PT, supervision to min OT Expected length of stay: ELOS 8 to 11 days Pt/Family Agrees to Admission and willing to participate: Yes Program Orientation Provided & Reviewed with Pt/Caregiver  Including Roles  & Responsibilities: Yes   Decrease burden of Care through IP rehab admission:    Possible need for SNF placement upon discharge:[facility] 03/05/14.  CIR 10/24/19 until 11/13/19 with Dr Carilyn and discharge home  with wife   Patient Condition: This patient's condition remains as documented in the consult dated 05/12/24, in which the Rehabilitation Physician determined and documented that the patient's condition is appropriate for intensive rehabilitative care in an inpatient rehabilitation facility. Will admit to inpatient rehab today.   Preadmission Screen Completed By:  Alison Heron Lot, RN MSN 05/14/2024 3:56 PM ______________________________________________________________________   Discussed status with Dr. Lorilee on 05/17/24 at 05/17/24 and received approval for admission today.   Admission Coordinator:  Alison Heron Lot, RN MSN time 950 Date 05/17/24

## 2024-05-17 NOTE — Progress Notes (Signed)
 Physical Medicine and Rehabilitation Consult Reason for Consult:Impaired functional mobility Referring Physician: Sheree     HPI: [name] [name] is a 78 y.o. male recently admitted and discharged to SNF for encephalopathy with a history of CVA with residual left hemiparesis, dementia, and  previous aortic dissection who presented on 9/19 for repair of a thoracoabdominal aneurysm as well as stenting of multiple associated vessels.  Pt with post op temp and leukocytosis, thrombocytopenia, AKI. Pt was up with therapy yesterday and was mod assist for sit-std transfers with signifcant LE weakness noted by therapy with frequent knee buckling and LLE weakness noted by therapist. He has not yet attempted ADL's. Pt was independent prior to arrival, using a rollator to ambulate, living with wife in a two level home (bed/bath upstairs) with 1 step to enter.        Home: Home Living Family/patient expects to be discharged to:: Private residence Living Arrangements: Spouse/significant other Available Help at Discharge: Family, Available 24 hours/day Type of Home: House Home Access: Stairs to enter [employer] of Steps: 1 Entrance Stairs-Rails: None Home Layout: Two level, Bed/bath upstairs Alternate Level Stairs-Number of Steps: 7+7 with landing Alternate Level Stairs-Rails: Right Bathroom Shower/Tub: Walk-in shower, Tub/shower unit (walk in shower upstairs) Bathroom Toilet: Handicapped height Bathroom Accessibility: Yes Home Equipment: Tub bench, Agricultural consultant (2 wheels), Rollator (4 wheels), Hand held shower head, BSC/3in1, Grab bars - toilet, Wheelchair - manual  Functional History: Prior Function Prior Level of Function : Independent/Modified Independent Mobility Comments: rollator to ambulate ADLs Comments: ind, plays piano Functional Status:  Mobility: Bed Mobility General bed mobility comments: In recliner Transfers Overall transfer level: Needs assistance Equipment  used: Rolling walker (2 wheels) Transfers: Sit to/from Stand Sit to Stand: Mod assist, +2 physical assistance General transfer comment: Mod assist +2 for boost and balance. Needs assist to scoot to edge of recliner. Cues for anterior weight shift and hand placement. Leans posteriorly when rising. Difficulty standing fully upright. Progressed to mod assist +1 on 3rd trial. Ambulation/Gait Pre-gait activities: Progressed with forward and retrostep. Poor quad control noted Lt , resulting in buckling and hyperextension unless blocked by therapist. He was able to march in place with heavy UE reliance on RW, and bracing on Lt side from therapist.   ADL:   Cognition: Cognition Orientation Level: (P) Oriented X4 Cognition Arousal: Alert Behavior During Therapy: WFL for tasks assessed/performed     Review of Systems  Constitutional: Negative.   HENT: Negative.    Eyes: Negative.   Respiratory:  Negative for cough.   Cardiovascular:  Negative for chest pain.  Gastrointestinal:  Negative for nausea.  Genitourinary:  Positive for frequency.  Musculoskeletal:  Positive for back pain and myalgias.  Neurological:  Positive for focal weakness and weakness. Negative for sensory change.       Past Medical History:  Diagnosis Date   Chronic back pain      stenosis   Diabetes mellitus without complication (HCC)     Enlarged prostate     GERD (gastroesophageal reflux disease)      occasionally will take a zantac(maybe once a month)   History of colon polyps     History of kidney stones     History of stress test      done 10 yrs. ago, as a baseline    Hyperlipidemia      takes Crestor  daily   Hypertension      takes Amlodipine  daily and Lotensin  as well  Stroke [facility])               Past Surgical History:  Procedure Laterality Date   ANTERIOR LAT LUMBAR FUSION Left 12/15/2014    Procedure: ANTERIOR LATERAL LUMBAR FUSION 1 LEVEL;  Surgeon: Oneil Priestly, MD;  Location: [facility] OR;  Service:  Orthopedics;  Laterality: Left;  Left sided lateral lumbar interbody fusion, lumbar 3-4, posterior spinal fusion, lumbar 3-4 with instrumentation.   COLONOSCOPY       ESOPHAGOGASTRODUODENOSCOPY       fatty tissue removed from stomach       LUMBAR LAMINECTOMY/DECOMPRESSION MICRODISCECTOMY N/A 07/08/2013    Procedure: LUMBAR LAMINECTOMY/DECOMPRESSION MICRODISCECTOMY;  Surgeon: Oneil Rodgers Priestly, MD;  Location: [facility] OR;  Service: Orthopedics;  Laterality: N/A;  Lumbar 3-4, lumbar 4-5 decompression   RADIOLOGY WITH ANESTHESIA N/A 10/22/2019    Procedure: MRI WITH ANESTHESIA OF LUMBAR SPINE WITHOUT CONTRAST;  Surgeon: Radiologist, Medication, MD;  Location: [facility] OR;  Service: Radiology;  Laterality: N/A;   right ankle surgery        as child    THORACIC AORTIC ENDOVASCULAR STENT GRAFT N/A 10/15/2019    Procedure: THORACIC AORTIC ENDOVASCULAR STENT GRAFT;  Surgeon: Sheree Penne Bruckner, MD;  Location: [facility] OR;  Service: Vascular;  Laterality: N/A;   THORACOABDOMINAL AORTIC ANEURYSM REPAIR Bilateral 05/08/2024    Procedure: REPAIR, ANEURYSM, AORTA, THORACOABDOMINAL;  Surgeon: Sheree Penne Bruckner, MD;  Location: [facility] OR;  Service: Vascular;  Laterality: Bilateral;   TONSILLECTOMY        as a child   ULTRASOUND GUIDANCE FOR VASCULAR ACCESS Bilateral 05/08/2024    Procedure: ULTRASOUND GUIDANCE, FOR VASCULAR ACCESS;  Surgeon: Sheree Penne Bruckner, MD;  Location: [facility] OR;  Service: Vascular;  Laterality: Bilateral;        History reviewed. No pertinent family history.     Social History:  reports that he has quit smoking. His smoking use included cigarettes. He has a 10 pack-year smoking history. He has never used smokeless tobacco. He reports that he does not drink alcohol and does not use drugs. Allergies:  Allergies       Allergies  Allergen Reactions   Lyrica [Pregabalin] Nausea Only and Other (See Comments)      Hallucinations Dizziness    Sulfa Antibiotics Other (See Comments)       Unknown reaction            Medications Prior to Admission  Medication Sig Dispense Refill   acetaminophen  (TYLENOL ) 500 MG tablet Take 500 mg by mouth 2 (two) times daily.       amLODipine  (NORVASC ) 10 MG tablet Take 10 mg by mouth daily.       aspirin  EC 81 MG tablet Take 81 mg by mouth daily.       carvedilol  (COREG ) 25 MG tablet Take 1 tablet (25 mg total) by mouth 2 (two) times daily with a meal. 60 tablet 0   losartan (COZAAR) 50 MG tablet Take 50 mg by mouth daily.       memantine  (NAMENDA ) 10 MG tablet TAKE 1 TABLET BY MOUTH TWICE DAILY 60 tablet 10   metFORMIN (GLUCOPHAGE) 500 MG tablet Take 500 mg by mouth 2 (two) times daily.       Multiple Vitamins-Minerals (MENS 50+ MULTIVITAMIN) TABS Take 1 tablet by mouth daily.       rosuvastatin  (CRESTOR ) 10 MG tablet Take 10 mg by mouth at bedtime.       tamsulosin  (FLOMAX ) 0.4 MG CAPS capsule Take 1 capsule (0.4  mg total) by mouth daily. 30 capsule 0   hydrALAZINE  (APRESOLINE ) 10 MG tablet Take 1 tablet (10 mg total) by mouth 3 (three) times daily. (Patient not taking: Reported on 05/06/2024) 180 tablet 0   pantoprazole  (PROTONIX ) 40 MG tablet Take 1 tablet (40 mg total) by mouth daily. (Patient not taking: Reported on 05/06/2024) 30 tablet 0            Blood pressure (!) 161/72, pulse 69, temperature 98.3 F (36.8 C), temperature source Oral, resp. rate (!) 21, height 5' 11 (1.803 m), weight 88.5 kg, SpO2 97%. Physical Exam Constitutional:      Appearance: He is not ill-appearing.  HENT:     Head: Normocephalic.     Right Ear: External ear normal.     Left Ear: External ear normal.     Nose: Nose normal.     Mouth/Throat:     Pharynx: Oropharynx is clear.  Eyes:     Conjunctiva/sclera: Conjunctivae normal.  Cardiovascular:     Rate and Rhythm: Normal rate.  Pulmonary:     Effort: Pulmonary effort is normal.  Abdominal:     Palpations: Abdomen is soft.  Musculoskeletal:        General: Tenderness (chest wall and groin  areas) present.     Cervical back: Normal range of motion.  Skin:    General: Skin is warm.     Comments: Incisions CDI  Neurological:     Mental Status: He is alert.     Comments: Pt is alert, oriented to [place], place, reason. At times slow to process. Speech is fairly clear. Language normal. Left central VII present. MMT: RUE 4+ to 5/5. LUE 4/5 prox to distal. RLE 3-HF, 3/5 KE and 5/5 ADF/PF. LLE 2/5 HF and KE with 4 to 4+ ADF/PF. Appears to have intact LT and PP in all 4's. DTR's tr to 1+.   Psychiatric:        Mood and Affect: Mood normal.        Behavior: Behavior normal.       Lab Results Last 24 Hours       Results for orders placed or performed during the hospital encounter of 05/08/24 (from the past 24 hours)  Glucose, capillary     Status: Abnormal    Collection Time: 05/11/24 11:23 AM  Result Value Ref Range    Glucose-Capillary 144 (H) 70 - 99 mg/dL  Protime-INR     Status: Abnormal    Collection Time: 05/11/24  2:43 PM  Result Value Ref Range    Prothrombin Time 16.2 (H) 11.4 - 15.2 seconds    INR 1.2 0.8 - 1.2  CBC     Status: Abnormal    Collection Time: 05/11/24  2:43 PM  Result Value Ref Range    WBC 12.4 (H) 4.0 - 10.5 K/uL    RBC 3.15 (L) 4.22 - 5.81 MIL/uL    Hemoglobin 9.2 (L) 13.0 - 17.0 g/dL    HCT 71.9 (L) 60.9 - 52.0 %    MCV 88.9 80.0 - 100.0 fL    MCH 29.2 26.0 - 34.0 pg    MCHC 32.9 30.0 - 36.0 g/dL    RDW 84.1 (H) 88.4 - 15.5 %    Platelets 106 (L) 150 - 400 K/uL    nRBC 0.0 0.0 - 0.2 %  Glucose, capillary     Status: Abnormal    Collection Time: 05/11/24  4:23 PM  Result Value Ref Range  Glucose-Capillary 116 (H) 70 - 99 mg/dL  Glucose, capillary     Status: Abnormal    Collection Time: 05/11/24  9:16 PM  Result Value Ref Range    Glucose-Capillary 136 (H) 70 - 99 mg/dL  Glucose, capillary     Status: Abnormal    Collection Time: 05/12/24  6:33 AM  Result Value Ref Range    Glucose-Capillary 135 (H) 70 - 99 mg/dL      Imaging  Results (Last 48 hours)  No results found.     Assessment/Plan: Diagnosis: 78 yo male with prior CVA debility after thoracobdominal aorta repair/vascular stenting.  Does the need for close, 24 hr/day medical supervision in concert with the patient's rehab needs make it unreasonable for this patient to be served in a less intensive setting? Yes Co-Morbidities requiring supervision/potential complications:  -pain mgt -wound care -nutrition Due to bladder management, bowel management, safety, skin/wound care, disease management, medication administration, pain management, and patient education, does the patient require 24 hr/day rehab nursing? Yes Does the patient require coordinated care of a physician, rehab nurse, therapy disciplines of PT,OT to address physical and functional deficits in the context of the above medical diagnosis(es)? Yes Addressing deficits in the following areas: balance, endurance, locomotion, strength, transferring, bowel/bladder control, bathing, dressing, feeding, grooming, toileting, and psychosocial support Can the patient actively participate in an intensive therapy program of at least 3 hrs of therapy per day at least 5 days per week? Yes The potential for patient to make measurable gains while on inpatient rehab is excellent Anticipated functional outcomes upon discharge from inpatient rehab are supervision  with PT, supervision and min assist with OT, n/a with SLP. Estimated rehab length of stay to reach the above functional goals is: 8-11 days Anticipated discharge destination: Home Overall Rehab/Functional Prognosis: excellent   POST ACUTE RECOMMENDATIONS: This patient's condition is appropriate for continued rehabilitative care in the following setting: CIR Patient has agreed to participate in recommended program. Yes Note that insurance prior authorization may be required for reimbursement for recommended care.   Comment: Pt can stay on first floor if  needed. Wife is available to assist. Rehab Admissions Coordinator to follow up.         I have personally performed a face to face diagnostic evaluation of this patient. Additionally, I have examined the patient's medical record including any pertinent labs and radiographic images.     Thanks,   Arthea ONEIDA Gunther, MD 05/12/2024

## 2024-05-17 NOTE — Discharge Instructions (Signed)
  [facility]   Discharge Instructions  Endovascular Aortic Repair  Please refer to the following instructions for your post-procedure care. Your surgeon or Physician Assistant will discuss any changes with you.  Activity  You are encouraged to walk as much as you can. You can slowly return to normal activities but must avoid strenuous activity and heavy lifting until your doctor tells you it's OK. Avoid activities such as vacuuming or swinging a gold club. It is normal to feel tired for several weeks after your surgery. Do not drive until your doctor gives the OK and you are no longer taking prescription pain medications. It is also normal to have difficulty with sleep habits, eating, and bowel movements after surgery. These will go away with time.  Bathing/Showering  Shower daily after you go home.  Do not soak in a bathtub, hot tub, or swim until the incision heals completely.  If you have incisions in your groin, wash the groin wounds with soap and water daily and pat dry. (No tub bath-only shower)  Then put a dry gauze or washcloth there to keep this area dry to help prevent wound infection daily and as needed.  Do not use Vaseline or neosporin on your incisions.  Only use soap and water on your incisions and then protect and keep dry.  Incision Care  Shower every day. Clean your incision with mild soap and water. Pat the area dry with a clean towel. You do not need a bandage unless otherwise instructed. Do not apply any ointments or creams to your incision. If you clothing is irritating, you may cover your incision with a dry gauze pad.  Diet  Resume your normal diet. There are no special food restrictions following this procedure. A low fat/low cholesterol diet is recommended for all patients with vascular disease. In order to heal from your surgery, it is CRITICAL to get adequate nutrition. Your body requires vitamins, minerals, and protein. Vegetables  are the best source of vitamins and minerals. Vegetables also provide the perfect balance of protein. Processed food has little nutritional value, so try to avoid this.  Medications  Resume taking all of your medications unless your doctor or nurse practitioner tells you not to. If your incision is causing pain, you may take over-the-counter pain relievers such as acetaminophen  (Tylenol ). If you were prescribed a stronger pain medication, please be aware these medications can cause nausea and constipation. Prevent nausea by taking the medication with a snack or meal. Avoid constipation by drinking plenty of fluids and eating foods with a high amount of fiber, such as fruits, vegetables, and grains.  Do not take Tylenol  if you are taking prescription pain medications.   Follow up  Our office will schedule a follow-up appointment with a CT scan 3-4 weeks after your surgery.  Please call us  immediately for any of the following conditions  Severe or worsening pain in your legs or feet or in your abdomen back or chest. Increased pain, redness, drainage (pus) from your incision site. Increased abdominal pain, bloating, nausea, vomiting or persistent diarrhea. Fever of 101 degrees or higher. Swelling in your leg (s),  Reduce your risk of vascular disease  Stop smoking. If you would like help call [facility] at 1-800-QUIT-NOW (262-585-7815) or [place] at [phone]. Manage your cholesterol Maintain a desired weight Control your diabetes Keep your blood pressure down  If you have questions, please call the office at [phone].

## 2024-05-17 NOTE — H&P (Signed)
 Physical Medicine and Rehabilitation Admission H&P    CC: Acute right cerebellar infarct  HPI:  [name] Freeland. [name] is a 78 year old male with a PMH of CVA, HTN, HLD, GERD, and diabetes, who is 9 days postop from thoracic aortic aneurysm repair. 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 He is doing well without pain or worsening weakness. Incision site is c/d/I. He is stable for admission to CIR today   ROS +left >right sided lower extremity weakness, +blurry vision   Past Medical History:  Diagnosis Date   Chronic back pain    stenosis   Diabetes mellitus without complication (HCC)    Enlarged prostate    GERD (gastroesophageal reflux disease)    occasionally will take a zantac(maybe once a month)   History of colon polyps    History of kidney stones    History of stress test    done 10 yrs. ago, as a baseline    Hyperlipidemia    takes Crestor  daily   Hypertension    takes Amlodipine  daily and Lotensin  as well   Stroke [facility])     Past Surgical History:  Procedure Laterality Date   ANTERIOR LAT LUMBAR FUSION Left 12/15/2014   Procedure: ANTERIOR LATERAL LUMBAR FUSION 1 LEVEL;  Surgeon: Oneil Priestly, MD;  Location: [facility] OR;  Service: Orthopedics;  Laterality: Left;  Left sided lateral lumbar interbody fusion, lumbar 3-4, posterior spinal fusion, lumbar 3-4 with instrumentation.   COLONOSCOPY     ESOPHAGOGASTRODUODENOSCOPY     fatty tissue removed from stomach     LUMBAR LAMINECTOMY/DECOMPRESSION MICRODISCECTOMY N/A 07/08/2013   Procedure: LUMBAR LAMINECTOMY/DECOMPRESSION MICRODISCECTOMY;  Surgeon: Oneil Rodgers Priestly, MD;  Location: [facility] OR;  Service: Orthopedics;  Laterality: N/A;  Lumbar 3-4, lumbar 4-5 decompression   RADIOLOGY WITH ANESTHESIA N/A 10/22/2019   Procedure: MRI WITH ANESTHESIA OF LUMBAR SPINE WITHOUT CONTRAST;  Surgeon: Radiologist, Medication, MD;  Location: [facility] OR;  Service: Radiology;   Laterality: N/A;   right ankle surgery     as child    THORACIC AORTIC ENDOVASCULAR STENT GRAFT N/A 10/15/2019   Procedure: THORACIC AORTIC ENDOVASCULAR STENT GRAFT;  Surgeon: Sheree Penne Bruckner, MD;  Location: [facility] OR;  Service: Vascular;  Laterality: N/A;   THORACOABDOMINAL AORTIC ANEURYSM REPAIR Bilateral 05/08/2024   Procedure: REPAIR, ANEURYSM, AORTA, THORACOABDOMINAL;  Surgeon: Sheree Penne Bruckner, MD;  Location: [facility] OR;  Service: Vascular;  Laterality: Bilateral;   TONSILLECTOMY     as a child   ULTRASOUND GUIDANCE FOR VASCULAR ACCESS Bilateral 05/08/2024   Procedure: ULTRASOUND GUIDANCE, FOR VASCULAR ACCESS;  Surgeon: Sheree Penne Bruckner, MD;  Location: [facility] OR;  Service: Vascular;  Laterality: Bilateral;    No family history on file.   Social History:  reports that he has quit smoking. His smoking use included cigarettes. He has a 10 pack-year smoking history. He has never used smokeless tobacco. He reports that he does not drink alcohol and does not use drugs. Allergies:  Allergies  Allergen Reactions   Lyrica [Pregabalin] Nausea Only and Other (See Comments)    Hallucinations Dizziness    Sulfa Antibiotics Other (See Comments)    Unknown reaction    Medications Prior to Admission  Medication Sig Dispense Refill   acetaminophen  (TYLENOL ) 500 MG tablet Take 500 mg by mouth 2 (two) times daily.     amLODipine  (NORVASC ) 10 MG tablet Take 10 mg by mouth daily.  aspirin  EC 81 MG tablet Take 81 mg by mouth daily.     carvedilol  (COREG ) 25 MG tablet Take 1 tablet (25 mg total) by mouth 2 (two) times daily with a meal. 60 tablet 0   losartan (COZAAR) 50 MG tablet Take 50 mg by mouth daily.     memantine  (NAMENDA ) 10 MG tablet TAKE 1 TABLET BY MOUTH TWICE DAILY 60 tablet 10   metFORMIN (GLUCOPHAGE) 500 MG tablet Take 500 mg by mouth 2 (two) times daily.     Multiple Vitamins-Minerals (MENS 50+ MULTIVITAMIN) TABS Take 1 tablet by mouth daily.     rosuvastatin   (CRESTOR ) 10 MG tablet Take 10 mg by mouth at bedtime.     tamsulosin  (FLOMAX ) 0.4 MG CAPS capsule Take 1 capsule (0.4 mg total) by mouth daily. 30 capsule 0   hydrALAZINE  (APRESOLINE ) 10 MG tablet Take 1 tablet (10 mg total) by mouth 3 (three) times daily. (Patient not taking: Reported on 05/06/2024) 180 tablet 0   pantoprazole  (PROTONIX ) 40 MG tablet Take 1 tablet (40 mg total) by mouth daily. (Patient not taking: Reported on 05/06/2024) 30 tablet 0   Home: Home Living Family/patient expects to be discharged to:: Private residence Living Arrangements: Spouse/significant other Available Help at Discharge: Family, Available 24 hours/day Type of Home: House Home Access: Stairs to enter [employer] of Steps: 1 Entrance Stairs-Rails: None Home Layout: Two level, Bed/bath upstairs Alternate Level Stairs-Number of Steps: 7+7 with landing Alternate Level Stairs-Rails: Right Bathroom Shower/Tub: Walk-in shower, Tub/shower unit (walk in shower upstairs) Bathroom Toilet: Handicapped height Bathroom Accessibility: Yes Home Equipment: Tub bench, Agricultural consultant (2 wheels), Rollator (4 wheels), Hand held shower head, BSC/3in1, Grab bars - toilet, Wheelchair - manual Additional Comments: Enjoys watching TV and walking outside on his driveway  Lives With: Spouse   Functional History: Prior Function Prior Level of Function : Independent/Modified Independent Mobility Comments: rollator to ambulate ADLs Comments: ind, plays piano   Functional Status:  Mobility: Bed Mobility Overal bed mobility: Needs Assistance Bed Mobility: Supine to Sit Rolling: Min assist Sidelying to sit: HOB elevated, Mod assist, +2 for safety/equipment, Used rails Supine to sit: Mod assist, Used rails, HOB elevated General bed mobility comments: assist for LE progression to EOB, trunk elevation, scooting with bed pad to EOB. posterior bias. Transfers Overall transfer level: Needs assistance Equipment used:  Rolling walker (2 wheels) Transfers: Sit to/from Stand, Bed to chair/wheelchair/BSC Sit to Stand: Max assist Bed to/from chair/wheelchair/BSC transfer type:: Step pivot Step pivot transfers: Mod assist, +2 physical assistance, +2 safety/equipment General transfer comment: max assist for power up, rise, steadying. stand x3, from EOB x2 and from recliner x1. squat pivot to recliner with max assist all aspects Ambulation/Gait General Gait Details: unable Pre-gait activities: Progressed with forward and retrostep. Poor quad control noted Lt , resulting in buckling and hyperextension unless blocked by therapist. He was able to march in place with heavy UE reliance on RW, and bracing on Lt side from therapist.   ADL: ADL Overall ADL's : Needs assistance/impaired Eating/Feeding: Set up, Sitting Eating/Feeding Details (indicate cue type and reason): cues to initiate Grooming: Oral care, Sitting, Set up Grooming Details (indicate cue type and reason): Performed at sink to encourage midline Upper Body Bathing: Moderate assistance Toilet Transfer: Moderate assistance, +2 for physical assistance, +2 for safety/equipment, Stand-pivot, Rolling walker (2 wheels) Toilet Transfer Details (indicate cue type and reason): Short step pivot simulated in room Functional mobility during ADLs: Moderate assistance, +2 for physical assistance, +2 for  safety/equipment, Rolling walker (2 wheels) General ADL Comments: Pt with heavy R lateral lean in sitting and standing, needing constant cueing to correct   Cognition: Cognition Orientation Level: Oriented X4 Cognition Arousal: Alert Behavior During Therapy: WFL for tasks assessed/performed   There were no vitals taken for this visit. Physical Exam Gen: no distress, normal appearing HEENT: oral mucosa pink and moist, NCAT Cardio: Reg rate Chest: normal effort, normal rate of breathing Abd: soft, non-distended Ext: no edema Psych: pleasant, normal  affect Skin: intact Neuro: Alert and oriented x3, closes right eye for comfort due to blurry vision, has disconjugate gaze, 3/5 strength in LLE, strength is otherwise 4/5 throughout, sensation is intact   Results for orders placed or performed during the hospital encounter of 05/08/24 (from the past 48 hours)  Glucose, capillary     Status: Abnormal   Collection Time: 05/15/24  5:00 PM  Result Value Ref Range   Glucose-Capillary 116 (H) 70 - 99 mg/dL    Comment: Glucose reference range applies only to samples taken after fasting for at least 8 hours.   Comment 1 Notify RN    Comment 2 Document in Chart   Glucose, capillary     Status: Abnormal   Collection Time: 05/15/24 10:05 PM  Result Value Ref Range   Glucose-Capillary 114 (H) 70 - 99 mg/dL    Comment: Glucose reference range applies only to samples taken after fasting for at least 8 hours.  Lipid panel     Status: Abnormal   Collection Time: 05/16/24  3:30 AM  Result Value Ref Range   Cholesterol 105 0 - 200 mg/dL   Triglycerides 99 <849 mg/dL   HDL 24 (L) >59 mg/dL   Total CHOL/HDL Ratio 4.4 RATIO   VLDL 20 0 - 40 mg/dL   LDL Cholesterol 61 0 - 99 mg/dL    Comment:        Total Cholesterol/HDL:CHD Risk Coronary Heart Disease Risk Table                     Men   Women  1/2 Average Risk   3.4   3.3  Average Risk       5.0   4.4  2 X Average Risk   9.6   7.1  3 X Average Risk  23.4   11.0        Use the calculated Patient Ratio above and the CHD Risk Table to determine the patient's CHD Risk.        ATP III CLASSIFICATION (LDL):  <100     mg/dL   Optimal  899-870  mg/dL   Near or Above                    Optimal  130-159  mg/dL   Borderline  839-810  mg/dL   High  >809     mg/dL   Very High Performed at [facility], [address], [place], [place] [address]   Glucose, capillary     Status: Abnormal   Collection Time: 05/16/24  6:09 AM  Result Value Ref Range   Glucose-Capillary 123 (H) 70 - 99 mg/dL     Comment: Glucose reference range applies only to samples taken after fasting for at least 8 hours.  Glucose, capillary     Status: Abnormal   Collection Time: 05/16/24 11:49 AM  Result Value Ref Range   Glucose-Capillary 106 (H) 70 - 99 mg/dL  Comment: Glucose reference range applies only to samples taken after fasting for at least 8 hours.  Glucose, capillary     Status: Abnormal   Collection Time: 05/16/24  4:19 PM  Result Value Ref Range   Glucose-Capillary 165 (H) 70 - 99 mg/dL    Comment: Glucose reference range applies only to samples taken after fasting for at least 8 hours.  Creatinine, serum     Status: Abnormal   Collection Time: 05/16/24  5:42 PM  Result Value Ref Range   Creatinine, Ser 1.30 (H) 0.61 - 1.24 mg/dL   GFR, Estimated 56 (L) >60 mL/min    Comment: (NOTE) Calculated using the CKD-EPI Creatinine Equation (2021) Performed at [facility], [address], [place], [place] [address]   Glucose, capillary     Status: Abnormal   Collection Time: 05/16/24  9:04 PM  Result Value Ref Range   Glucose-Capillary 106 (H) 70 - 99 mg/dL    Comment: Glucose reference range applies only to samples taken after fasting for at least 8 hours.  Glucose, capillary     Status: Abnormal   Collection Time: 05/17/24  6:07 AM  Result Value Ref Range   Glucose-Capillary 129 (H) 70 - 99 mg/dL    Comment: Glucose reference range applies only to samples taken after fasting for at least 8 hours.  Glucose, capillary     Status: Abnormal   Collection Time: 05/17/24 12:18 PM  Result Value Ref Range   Glucose-Capillary 123 (H) 70 - 99 mg/dL    Comment: Glucose reference range applies only to samples taken after fasting for at least 8 hours.   CT ANGIO HEAD NECK W WO CM Result Date: 05/17/2024 CLINICAL DATA:  Initial evaluation for stroke. EXAM: CT ANGIOGRAPHY HEAD AND NECK WITH AND WITHOUT CONTRAST TECHNIQUE: Multidetector CT imaging of the head and neck was performed using the  standard protocol during bolus administration of intravenous contrast. Multiplanar CT image reconstructions and MIPs were obtained to evaluate the vascular anatomy. Carotid stenosis measurements (when applicable) are obtained utilizing NASCET criteria, using the distal internal carotid diameter as the denominator. RADIATION DOSE REDUCTION: This exam was performed according to the departmental dose-optimization program which includes automated exposure control, adjustment of the mA and/or kV according to patient size and/or use of iterative reconstruction technique. CONTRAST:  75mL OMNIPAQUE  IOHEXOL  350 MG/ML SOLN COMPARISON:  Prior exams from 05/15/2024. FINDINGS: CT HEAD FINDINGS Brain: Age-related cerebral atrophy with mild chronic small vessel ischemic disease. Previously identified small right cerebellar infarct is grossly similar. Few small remote occipital infarcts noted. No acute intracranial hemorrhage. No acute large vessel territory infarct. No mass lesion or midline shift. Ventricular prominence related global parenchymal volume loss of hydrocephalus. Slight asymmetric prominence of the extra-axial space overlying the left cerebral convexity but with no identifiable collection. Vascular: No abnormal hyperdense vessel. Calcified atherosclerosis present at skull base. Skull: Scalp soft tissues and calvarium demonstrate no new finding. Sinuses/Orbits: Globes orbital soft tissues within normal limits. Paranasal sinuses and mastoid air cells remain largely clear. Other: None. Review of the MIP images confirms the above findings CTA NECK FINDINGS Aortic arch: Stent endograft partially visualized within the distal aortic arch. Arch itself is somewhat ectatic measuring up to 3.3 cm. No stenosis about the origin the great vessels. Right carotid system: Right common and internal carotid arteries are diffusely tortuous and ectatic. No dissection. Mild atheromatous change about the right carotid bulb without  stenosis. Previously question abnormality on prior MRA was consistent  with artifact, with stenosis seen about the proximal right carotid artery system. Left carotid system: Left common and internal carotid arteries are tortuous and ectatic. No dissection. Mild atheromatous change about the left carotid bulb without stenosis. Vertebral arteries: Both vertebral arteries arise from subclavian arteries. Left vertebral artery dominant. Vertebral arteries are patent without stenosis or dissection. Skeleton: No worrisome osseous lesions. Other neck: No other acute finding. Upper chest: No other acute finding. Review of the MIP images confirms the above findings CTA HEAD FINDINGS Anterior circulation: Mild atheromatous change about the carotid siphons without stenosis. A1 segments, anterior communicating artery complex common anterior cerebral arteries widely patent. No M1 stenosis or occlusion. Distal MCA branches perfused and symmetric. Posterior circulation: Both V4 segments patent without stenosis. Right PICA patent. Left PICA origin not seen. Basilar patent without stenosis. Superior cerebral arteries patent bilaterally. Both PCAs primarily supplied via the basilar well perfused or distal aspects. Venous sinuses: Patent allowing for timing the contrast bolus. Anatomic variants: As above.  No aneurysm. Review of the MIP images confirms the above findings IMPRESSION: CT HEAD: 1. Grossly stable appearance of previously identified small acute ischemic nonhemorrhagic right cerebellar infarct. 2. Age-related cerebral atrophy with mild chronic small vessel ischemic disease, with a few small remote occipital infarcts CTA HEAD AND NECK: 1. Negative CTA for large vessel occlusion or other emergent finding. 2. Mild atheromatous change about the carotid bifurcations and carotid siphons without hemodynamically significant stenosis. 3. Diffuse tortuosity and ectasia of the major arterial vasculature of the head and neck, suggesting  chronic underlying hypertension. 4. Previously noted attenuation of the proximal right CCA on prior MRA was consistent with artifact. No stenosis in this region. Electronically Signed   By: Morene Hoard M.D.   On: 05/17/2024 03:37   ECHOCARDIOGRAM COMPLETE Result Date: 05/16/2024    ECHOCARDIOGRAM REPORT   Patient Name:   [name] [name] Date of Exam: 05/16/2024 Medical Rec #:  [id number]      Height:       71.0 in Accession #:    [id number]     Weight:       189.2 lb Date of Birth:  04-Jul-1946      BSA:          2.059 m Patient Age:    78 years       BP:           146/80 mmHg Patient Gender: M              HR:           70 bpm. Exam Location:  Inpatient Procedure: 2D Echo, Cardiac Doppler, Color Doppler and Strain Analysis (Both            Spectral and Color Flow Doppler were utilized during procedure). Indications:    Stroke l63.9  History:        Patient has prior history of Echocardiogram examinations, most                 recent 03/02/2024. Risk Factors:Hypertension, Diabetes and                 Dyslipidemia. Hx of CKD.  Sonographer:    Aida Pizza RCS Referring Phys: [phone] MCNEILL P KIRKPATRICK  Sonographer Comments: Global longitudinal strain was attempted. IMPRESSIONS  1. Left ventricular ejection fraction, by estimation, is 60 to 65%. The left ventricle has normal function. The left ventricle has no regional wall motion abnormalities. There is moderate left ventricular hypertrophy. Left  ventricular diastolic parameters were normal.  2. Right ventricular systolic function is normal. The right ventricular size is normal.  3. A small pericardial effusion is present.  4. The mitral valve is normal in structure. Mild mitral valve regurgitation. No evidence of mitral stenosis.  5. The aortic valve is tricuspid. Aortic valve regurgitation is mild. No aortic stenosis is present.  6. Aortic dilatation noted. 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  7. The inferior vena cava is normal in  size with greater than 50% respiratory variability, suggesting right atrial pressure of 3 mmHg. FINDINGS  Left Ventricle: Left ventricular ejection fraction, by estimation, is 60 to 65%. The left ventricle has normal function. The left ventricle has no regional wall motion abnormalities. Strain was performed and the global longitudinal strain is indeterminate. The left ventricular internal cavity size was normal in size. There is moderate left ventricular hypertrophy. Left ventricular diastolic parameters were normal. Right Ventricle: The right ventricular size is normal. Right ventricular systolic function is normal. Left Atrium: Left atrial size was normal in size. Right Atrium: Right atrial size was normal in size. Pericardium: A small pericardial effusion is present. Mitral Valve: The mitral valve is normal in structure. Mild mitral valve regurgitation. No evidence of mitral valve stenosis. Tricuspid Valve: The tricuspid valve is normal in structure. Tricuspid valve regurgitation is trivial. No evidence of tricuspid stenosis. Aortic Valve: The aortic valve is tricuspid. Aortic valve regurgitation is mild. No aortic stenosis is present. Aortic valve mean gradient measures 6.0 mmHg. Aortic valve peak gradient measures 10.5 mmHg. Aortic valve area, by VTI measures 2.44 cm. Pulmonic Valve: The pulmonic valve was normal in structure. Pulmonic valve regurgitation is trivial. No evidence of pulmonic stenosis. Aorta: Aortic dilatation noted. 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 Venous: The inferior vena cava is normal in size with greater than 50% respiratory variability, suggesting right atrial pressure of 3 mmHg. IAS/Shunts: No atrial level shunt detected by color flow Doppler.  LEFT VENTRICLE PLAX 2D LVIDd:         5.00 cm   Diastology LVIDs:         3.00 cm   LV e' medial:    9.36 cm/s LV PW:         1.80 cm   LV E/e' medial:  6.0 LV IVS:        1.40 cm   LV e' lateral:   8.92 cm/s LVOT  diam:     2.10 cm   LV E/e' lateral: 6.3 LV SV:         91 LV SV Index:   44 LVOT Area:     3.46 cm  RIGHT VENTRICLE RV S prime:     21.10 cm/s TAPSE (M-mode): 2.1 cm LEFT ATRIUM             Index LA diam:        3.10 cm 1.51 cm/m LA Vol (A2C):   75.3 ml 36.57 ml/m LA Vol (A4C):   68.7 ml 33.36 ml/m LA Biplane Vol: 71.4 ml 34.67 ml/m  AORTIC VALVE AV Area (Vmax):    2.91 cm AV Area (Vmean):   2.47 cm AV Area (VTI):     2.44 cm AV Vmax:           162.00 cm/s AV Vmean:          110.000 cm/s AV VTI:  0.372 m AV Peak Grad:      10.5 mmHg AV Mean Grad:      6.0 mmHg LVOT Vmax:         136.00 cm/s LVOT Vmean:        78.567 cm/s LVOT VTI:          0.262 m LVOT/AV VTI ratio: 0.71  AORTA Ao Root diam: 4.40 cm MITRAL VALVE MV Area (PHT): 2.39 cm    SHUNTS MV Decel Time: 317 msec    Systemic VTI:  0.26 m MV E velocity: 56.60 cm/s  Systemic Diam: 2.10 cm MV A velocity: 85.30 cm/s MV E/A ratio:  0.66 Redell Shallow MD Electronically signed by Redell Shallow MD Signature Date/Time: 05/16/2024/1:07:40 PM    Final    MR THORACIC SPINE WO CONTRAST Result Date: 05/16/2024 CLINICAL DATA:  Initial evaluation for acute myelopathy. EXAM: MRI THORACIC SPINE WITHOUT CONTRAST TECHNIQUE: Multiplanar, multisequence MR imaging of the thoracic spine was performed. No intravenous contrast was administered. COMPARISON:  None Available. FINDINGS: Alignment: Vertebral bodies normally aligned with preservation of the normal thoracic kyphosis. No listhesis. Vertebrae: Vertebral body height maintained without acute or chronic fracture. Bone marrow signal intensity within normal limits. 1.8 cm atypical hemangioma noted within the T8 vertebral body. No other discrete or worrisome osseous lesions. No other significant abnormal marrow edema. Cord: There is a small focus of cord signal abnormality involving the left hemi cord at the level of T7-8 (series 24, image 24). This is also seen on sagittal STIR sequence (series 23, image 10).  Finding is nonspecific, but suspected to reflect a small focus of chronic myelomalacia. A small focus of demyelination would be the primary differential consideration. Otherwise normal signal and morphology. Paraspinal and other soft tissues: Paraspinous soft tissues demonstrate no acute finding. Aortic aneurysm with stent endograft in place, better evaluated on prior CT from 02/29/2024. Trace layering bilateral pleural effusions noted. Disc levels: T1-2: Negative interspace. Mild left-sided facet hypertrophy. No spinal stenosis. Mild left foraminal narrowing. Right neural foramen remains patent. T2-3: Negative interspace. Mild right-sided facet degeneration. No stenosis. T3-4: Negative interspace. Moderate right-sided facet hypertrophy. No spinal stenosis. Moderate right foraminal narrowing. T4-5: Negative interspace. Moderate right facet hypertrophy. No spinal stenosis. Foramina remain adequately patent. T5-6:  No significant disc bulge.  No canal or foraminal stenosis. T6-7: Mild disc bulge with superimposed tiny right paracentral disc protrusion. No spinal stenosis. Foramina remain patent. T7-8: Mild disc bulge, slightly asymmetric to the left. Mild facet hypertrophy. No spinal stenosis. Foramina remain patent. T8-9: Mild disc bulge with endplate spurring. No canal or foraminal stenosis. T9-10: Minimal annular disc bulge. Mild right greater left facet hypertrophy. No stenosis. T10-11: Disc bulge with superimposed right paracentral to foraminal disc protrusion. Moderate bilateral facet hypertrophy. No significant spinal stenosis. Foramina remain patent. T11-12: Negative interspace. Right worse than left facet hypertrophy. No stenosis. T12-L1:  Negative interspace.  Mild facet hypertrophy.  No stenosis. IMPRESSION: 1. Single focus of cord signal abnormality involving the left hemi cord at the level of T7-8. Finding is nonspecific, but suspected to reflect a small focus of chronic myelomalacia. A small focus of  demyelination would be the primary differential consideration. 2. Multilevel thoracic spondylosis and facet arthrosis as detailed above. No significant spinal stenosis. Moderate right foraminal narrowing at T3-4, largely due to facet disease. 3. Trace layering bilateral pleural effusions. Electronically Signed   By: Morene Hoard M.D.   On: 05/16/2024 03:10   MR CERVICAL SPINE WO CONTRAST Result Date: 05/15/2024  CLINICAL DATA:  Initial evaluation for acute myelopathy. EXAM: MRI CERVICAL SPINE WITHOUT CONTRAST TECHNIQUE: Multiplanar, multisequence MR imaging of the cervical spine was performed. No intravenous contrast was administered. COMPARISON:  None Available. FINDINGS: Alignment: Examination degraded by motion artifact. Vertebral bodies normally aligned with preservation of the normal cervical lordosis. Trace facet mediated anterolisthesis of C7 on T1. Vertebrae: Vertebral body height maintained without acute or chronic fracture. Bone marrow signal intensity overall within normal limits. No worrisome osseous lesions. No abnormal marrow edema. Cord: Normal signal and morphology. No convincing cord signal changes seen on this motion degraded exam. Posterior Fossa, vertebral arteries, paraspinal tissues: Unremarkable. Disc levels: C2-C3: Minimal disc bulge with right-sided uncovertebral spurring. No spinal stenosis. Foramina remain patent. C3-C4: Mild disc bulge with bilateral uncovertebral hypertrophy. Mild flattening of the ventral thecal sac without significant spinal stenosis. Moderate left C4 foraminal narrowing. Right neural foramen remains patent. C4-C5: Broad-based central to left paracentral disc protrusion indents the ventral thecal sac. Mild cord flattening without cord signal changes. Mild spinal stenosis. Left worse than right uncovertebral spurring with severe left C5 foraminal stenosis. Right neural foramina remains patent. C5-C6: Mild disc bulge with uncovertebral spurring. Flattening of  the ventral thecal sac, asymmetric to the left. Mild spinal stenosis. Mild left C6 foraminal narrowing. Right neural foramen remains patent. C6-C7: Mild disc bulge with bilateral uncovertebral spurring. No spinal stenosis. Severe left C7 foraminal narrowing. Right neural foramen remains patent. C7-T1: Negative interspace. Mild to moderate left with mild right facet hypertrophy. No spinal stenosis. Mild left C8 foraminal narrowing. Right neural foramen remains patent. IMPRESSION: 1. Normal MRI appearance of the cervical spinal cord. No cord signal changes to suggest myelopathy. 2. 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 3. Multifactorial degenerative changes with resultant multilevel foraminal narrowing as above. Notable findings include moderate left C4 foraminal stenosis, severe left C5 and C7 foraminal narrowing, with mild left C6 and C8 foraminal stenosis. Electronically Signed   By: Morene Hoard M.D.   On: 05/15/2024 23:11   MR ANGIO HEAD WO CONTRAST Result Date: 05/15/2024 CLINICAL DATA:  Follow-up examination for stroke. EXAM: MRA NECK WITHOUT CONTRAST MRA HEAD WITHOUT CONTRAST TECHNIQUE: Angiographic images of the Circle of Willis were acquired using MRA technique without intravenous contrast. COMPARISON:  Prior MRI from 05/14/2024. FINDINGS: MRA NECK FINDINGS Aortic arch: Examination technically limited by motion and lack of IV contrast. Aortic arch and origin of the great vessels not included or evaluated on this exam. Right carotid system: Tortuosity and ectasia of the proximal right CCA. The proximal right CCA is attenuated at this level (series 11, image 65). While this is suspected to be artifactual, a possible underlying stenosis contributing to this appearance could be present given the technical limitations of this exam. Atheromatous irregularity about the right carotid bulb but without hemodynamically significant greater than 50% stenosis by  MRA. Right ICA patent distally. No dissection. Left carotid system: Visualized left CCA patent with antegrade flow. No significant atheromatous irregularity or narrowing about the left carotid bulb. Left ICA mildly tortuous but widely patent distally. No dissection. Vertebral arteries: Both vertebral arteries arise from the subclavian arteries. Neither vertebral artery origin well evaluated on this exam. Left vertebral artery dominant. Visualized vertebral arteries are patent with antegrade flow. No stenosis or dissection. Other: None. MRA HEAD FINDINGS Anterior circulation: Both internal carotid arteries widely patent through the siphons without stenosis or other abnormality. A1 segments patent bilaterally. Normal anterior communicating artery complex. Anterior cerebral arteries  widely patent without stenosis. No M1 stenosis or occlusion. No proximal MCA branch occlusion or high-grade stenosis. Distal MCA branches perfused and symmetric. Posterior circulation: Both V4 segments widely patent without stenosis. Left vertebral artery strongly dominant. Neither PICA origin well visualized. Basilar patent without stenosis. Superior cerebellar and posterior cerebral arteries widely patent bilaterally. Anatomic variants: Dominant left vertebral artery.  No aneurysm. IMPRESSION: MRA HEAD: Negative intracranial MRA. No large vessel occlusion or hemodynamically significant stenosis. No aneurysm. MRA NECK: 1. Somewhat technically limited exam due to motion and lack of IV contrast. 2. Asymmetric attenuation of the proximal right CCA. While this is suspected to be artifactual in nature as the vessel appears tortuous and ectatic at this level, a possible underlying stenosis is difficult to exclude given the technical limitations of this exam. Correlation with dedicated CTA could be performed for further evaluation as warranted. 3. Atheromatous change about the right carotid bulb without hemodynamically significant greater than  50% stenosis. 4. Wide patency of the left carotid artery system within the neck. 5. Wide patency of both vertebral arteries within the neck. Left vertebral artery dominant. Electronically Signed   By: Morene Hoard M.D.   On: 05/15/2024 22:58   MR ANGIO NECK WO CONTRAST Result Date: 05/15/2024 CLINICAL DATA:  Follow-up examination for stroke. EXAM: MRA NECK WITHOUT CONTRAST MRA HEAD WITHOUT CONTRAST TECHNIQUE: Angiographic images of the Circle of Willis were acquired using MRA technique without intravenous contrast. COMPARISON:  Prior MRI from 05/14/2024. FINDINGS: MRA NECK FINDINGS Aortic arch: Examination technically limited by motion and lack of IV contrast. Aortic arch and origin of the great vessels not included or evaluated on this exam. Right carotid system: Tortuosity and ectasia of the proximal right CCA. The proximal right CCA is attenuated at this level (series 11, image 65). While this is suspected to be artifactual, a possible underlying stenosis contributing to this appearance could be present given the technical limitations of this exam. Atheromatous irregularity about the right carotid bulb but without hemodynamically significant greater than 50% stenosis by MRA. Right ICA patent distally. No dissection. Left carotid system: Visualized left CCA patent with antegrade flow. No significant atheromatous irregularity or narrowing about the left carotid bulb. Left ICA mildly tortuous but widely patent distally. No dissection. Vertebral arteries: Both vertebral arteries arise from the subclavian arteries. Neither vertebral artery origin well evaluated on this exam. Left vertebral artery dominant. Visualized vertebral arteries are patent with antegrade flow. No stenosis or dissection. Other: None. MRA HEAD FINDINGS Anterior circulation: Both internal carotid arteries widely patent through the siphons without stenosis or other abnormality. A1 segments patent bilaterally. Normal anterior  communicating artery complex. Anterior cerebral arteries widely patent without stenosis. No M1 stenosis or occlusion. No proximal MCA branch occlusion or high-grade stenosis. Distal MCA branches perfused and symmetric. Posterior circulation: Both V4 segments widely patent without stenosis. Left vertebral artery strongly dominant. Neither PICA origin well visualized. Basilar patent without stenosis. Superior cerebellar and posterior cerebral arteries widely patent bilaterally. Anatomic variants: Dominant left vertebral artery.  No aneurysm. IMPRESSION: MRA HEAD: Negative intracranial MRA. No large vessel occlusion or hemodynamically significant stenosis. No aneurysm. MRA NECK: 1. Somewhat technically limited exam due to motion and lack of IV contrast. 2. Asymmetric attenuation of the proximal right CCA. While this is suspected to be artifactual in nature as the vessel appears tortuous and ectatic at this level, a possible underlying stenosis is difficult to exclude given the technical limitations of this exam. Correlation with dedicated CTA could be performed  for further evaluation as warranted. 3. Atheromatous change about the right carotid bulb without hemodynamically significant greater than 50% stenosis. 4. Wide patency of the left carotid artery system within the neck. 5. Wide patency of both vertebral arteries within the neck. Left vertebral artery dominant. Electronically Signed   By: Morene Hoard M.D.   On: 05/15/2024 22:58      There were no vitals taken for this visit.  Medical Problem List and Plan: 1. 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  Antithrombotics: -DVT/anticoagulation:  Pharmaceutical: Lovenox   -antiplatelet therapy: continue ASA  3. Pain Management: continue Oxycodone  prn for severe and tylenol  prn for mild pain  4. Mood/Behavior/Sleep: LCSW to follow for evaluation  and support.   -antipsychotic agents: N/A  5. Neuropsych/cognition: This patient is not capable of making decisions on his own behalf.  6. Skin/Wound Care: Routine pressure relief measures. Monitor incision for healing  7. Fluids/Electrolytes/Nutrition:  Monitor I/O. Check CMET in am  --offer supplement prn  8. T2DM: Monitor BS ac/hs and use SSI for elevated BS  --Was on metform 500 mg bid--held due to dye study, consider resuming once kidney function improves  9.HTN: Monitor BP TID--off home meds at this time. BP reviewed and is starting to increase, will start amlodipine  2.5mg  daily  10. AKI: BUN/SCr 11/1.21 at admission with rise to 19/1.54 post procedure  --monitor for trends.   11. Thrombocytopenia: Normal at admission--153 but down to 93 and recovering --Likely reactive but will monitor for signs of bleeding.   12.  ABLA: Recheck in am.   13. Leucocytosis: Monitor for fevers and other signs of infection.  14.  H/o dementia: continue Namenda .   I have personally performed a face to face diagnostic evaluation, including, but not limited to relevant history and physical exam findings, of this patient and developed relevant assessment and plan.  Additionally, I have reviewed and concur with the physician assistant's documentation above.  Sharlet GORMAN Schmitz, PA-C    Sven SHAUNNA Elks, MD 05/17/2024

## 2024-05-18 DIAGNOSIS — I639 Cerebral infarction, unspecified: Secondary | ICD-10-CM | POA: Diagnosis not present

## 2024-05-18 DIAGNOSIS — R29898 Other symptoms and signs involving the musculoskeletal system: Secondary | ICD-10-CM

## 2024-05-18 DIAGNOSIS — I71019 Dissection of thoracic aorta, unspecified: Secondary | ICD-10-CM | POA: Diagnosis not present

## 2024-05-18 LAB — CBC
HCT: 26.8 % — ABNORMAL LOW (ref 39.0–52.0)
Hemoglobin: 8.8 g/dL — ABNORMAL LOW (ref 13.0–17.0)
MCH: 28.9 pg (ref 26.0–34.0)
MCHC: 32.8 g/dL (ref 30.0–36.0)
MCV: 88.2 fL (ref 80.0–100.0)
Platelets: 326 K/uL (ref 150–400)
RBC: 3.04 MIL/uL — ABNORMAL LOW (ref 4.22–5.81)
RDW: 14.8 % (ref 11.5–15.5)
WBC: 9.8 K/uL (ref 4.0–10.5)
nRBC: 0.2 % (ref 0.0–0.2)

## 2024-05-18 LAB — BASIC METABOLIC PANEL WITH GFR
Anion gap: 9 (ref 5–15)
BUN: 18 mg/dL (ref 8–23)
CO2: 20 mmol/L — ABNORMAL LOW (ref 22–32)
Calcium: 8.5 mg/dL — ABNORMAL LOW (ref 8.9–10.3)
Chloride: 109 mmol/L (ref 98–111)
Creatinine, Ser: 1.38 mg/dL — ABNORMAL HIGH (ref 0.61–1.24)
GFR, Estimated: 52 mL/min — ABNORMAL LOW (ref >60)
Glucose, Bld: 109 mg/dL — ABNORMAL HIGH (ref 70–99)
Potassium: 4 mmol/L (ref 3.5–5.1)
Sodium: 138 mmol/L (ref 135–145)

## 2024-05-18 LAB — GLUCOSE, CAPILLARY
Glucose-Capillary: 97 mg/dL (ref 70–99)
Glucose-Capillary: 129 mg/dL — ABNORMAL HIGH (ref 70–99)
Glucose-Capillary: 132 mg/dL — ABNORMAL HIGH (ref 70–99)
Glucose-Capillary: 134 mg/dL — ABNORMAL HIGH (ref 70–99)

## 2024-05-18 MED ORDER — SENNOSIDES-DOCUSATE SODIUM 8.6-50 MG PO TABS
2.0000 | ORAL_TABLET | Freq: Every day | ORAL | Status: DC
Start: 2024-05-18 — End: 2024-06-03
  Administered 2024-05-18 – 2024-06-03 (×17): 2 via ORAL
  Filled 2024-05-18 (×17): qty 2

## 2024-05-18 MED ORDER — AMLODIPINE BESYLATE 2.5 MG PO TABS
2.5000 mg | ORAL_TABLET | Freq: Every day | ORAL | Status: DC
  Administered 2024-05-18 – 2024-05-20 (×3): 2.5 mg via ORAL
  Filled 2024-05-18 (×3): qty 1

## 2024-05-18 NOTE — Plan of Care (Signed)
  Problem: RH Problem Solving Goal: LTG Patient will demonstrate problem solving for (SLP) Description: LTG:  Patient will demonstrate problem solving for basic/complex daily situations with cues  (SLP) Flowsheets (Taken 05/18/2024 1631) LTG: Patient will demonstrate problem solving for (SLP): Basic daily situations LTG Patient will demonstrate problem solving for: Supervision   Problem: RH Memory Goal: LTG Patient will use memory compensatory aids to (SLP) Description: LTG:  Patient will use memory compensatory aids to recall biographical/new, daily complex information with cues (SLP) Flowsheets (Taken 05/18/2024 1631) LTG: Patient will use memory compensatory aids to (SLP): Minimal Assistance - Patient > 75%   Problem: RH Attention Goal: LTG Patient will demonstrate this level of attention during functional activites (SLP) Description: LTG:  Patient will will demonstrate this level of attention during functional activites (SLP) Flowsheets (Taken 05/18/2024 1631) Patient will demonstrate during cognitive/linguistic activities the attention type of: Sustained Patient will demonstrate this level of attention during cognitive/linguistic activities in: Controlled LTG: Patient will demonstrate this level of attention during cognitive/linguistic activities with assistance of (SLP): Supervision Number of minutes patient will demonstrate attention during cognitive/linguistic activities: 10-15   Problem: RH Pre-functional/Other (Specify) Goal: RH LTG SLP (Specify) 1 Description: RH LTG SLP (Specify) 1 Flowsheets (Taken 05/18/2024 1631) LTG: Other SLP (Specify) 1: Pt will complete verbal organization/thought formulation tasks w/ supervision Goal: RH LTG SLP (Specify) 2 Description: RH LTG SLP (Specify) 2 Flowsheets (Taken 05/18/2024 1631) LTG: Other SLP (Specify) 2: Pt will process mildly complex information w/ supervisionA

## 2024-05-18 NOTE — Progress Notes (Signed)
 Inpatient Rehabilitation  Patient information reviewed and entered into eRehab system by Feliberto Gottron, M.A., CCC-SLP, Rehab Quality Coordinator.  Information including medical coding, functional ability and quality indicators will be reviewed and updated through discharge.

## 2024-05-18 NOTE — Progress Notes (Signed)
 Physical Therapy Session Note  Patient Details  Name: [name] [name] MRN: [id number] Date of Birth: Apr 17, 1946  Today's Date: 05/18/2024 PT Individual Time: 1600-1700 PT Individual Time Calculation (min): 60 min   Short Term Goals: Week 1:  PT Short Term Goal 1 (Week 1): Pt will be able to perform bed mobility with mod A PT Short Term Goal 2 (Week 1): Pt will be able to perform transfers bed <> w/c with mod A PT Short Term Goal 3 (Week 1): Pt will initiate gait PT Short Term Goal 4 (Week 1): Pt will demonstrate dynamic sitting balance with min A  Skilled Therapeutic Interventions/Progress Updates:   Pt in bed with family leaving the room as PT enters. Pt has no complaints of pain, just generalized fatigue. Pt pleasant and motivated throughout therapy treatment. Needs ++ time for initiation and processing.   Supine<>sitting EOB requiring maxA for trunk support. Able to walk his legs out to EOB but slow and ++ time. Patient needs assist for sitting balance due to posterior lean  - unable to correct by himself with any LOB posteriorly.   Sit<>stand from highly elevated bed using the [facility] with minA. Able to sit on flaps on Stedy with CGA while he's transferred to wheelchair.   Pt completed w/c mobiltiy ~133ft with minA including x2 turns. Patient veers R and has difficulty maintaining his straight path. Mostly supervision with intermittent minA for returning straight.   Patient completed sit<>stand from w/c to RW with modA with cues for hand placement - continues to have significant posterior lean with weight on heels and toes elevated. He's able to progress to gait training where he ambulated 29ft with modA and RW. Short shuffling steps, forward flexed trunk (took 35 steps to ambulate 79ft). Pt reports 8/10 fatigue after 30ft of gait.   Worked on sit<>stands from mat table to RW and sustained standing with RW support. Pt needs modA for sit<>stand and min/modA for standing balance due to  retropulsion. Standing tolerance for 1-2 minutes max. Pt provided back support for rest breaks while sitting due to his fatigue.   Pt returned to his room at w/c level for energy conservation. Attempted to transfer patient via Stand pivot using the RW but due to fatigue from session, unable to safely complete. Completed squat pivot mod/maxA transfer to his L side from drop arm w/c. MaxA for returning to supine for trunk and BLE management. Ended treatment with alarm on and his needs met.   Therapy Documentation Precautions:  Precautions Precautions: Fall Recall of Precautions/Restrictions: Impaired Precaution/Restrictions Comments: incision near L clavicle (axillary artery) and bil groins; L weakness Restrictions Weight Bearing Restrictions Per Provider Order: No General:      Therapy/Group: Individual Therapy  Naara Kelty P Kea Callan 05/18/2024, 4:01 PM

## 2024-05-18 NOTE — Progress Notes (Signed)
 Inpatient Rehabilitation Admission Medication Review by a Pharmacist  A complete drug regimen review was completed for this patient to identify any potential clinically significant medication issues.  High Risk Drug Classes Is patient taking? Indication by Medication  Antipsychotic Yes, as an intravenous medication PRN Prochlorperazine (PO, PR or IV) - nausea  Anticoagulant Yes Enoxaparin  - VTE prophalaxis  Antibiotic No   Opioid No   Antiplatelet Yes Aspirin  81 and Clopidogrel x 3 weeks, then ASA alone - CVA prophylaxis, PVD  Hypoglycemics/insulin  Yes Insulin  aspart SSI - DM2  Vasoactive Medication Yes Amlodipine  - hypertension Tamsulosin  - urinary retention  Chemotherapy No   Other Yes Memantine  - dementia MVI - supplement Pantoprazole  - GERD Rosuvastatin  - hyperlipidemia  PRNs: Acetaminophen  - mild pain Maalox - indigestion Diphenhydramine - itching Guaifenesin -dextromethorphan - cough Melatonin - sleep Bisacodyl  PR, Fleets enema - constipation     Type of Medication Issue Identified Description of Issue Recommendation(s)  Drug Interaction(s) (clinically significant)     Duplicate Therapy     Allergy     No Medication Administration End Date  Aspirin  81 mg and Clopidogrel planned for 3 weeks, then Asprin alone. Clopidogrel begun 9/27 while inpatient. Last dose of Clopidogrel should be on 06/06/24.  Incorrect Dose     Additional Drug Therapy Needed     Significant med changes from prior encounter (inform family/care partners about these prior to discharge). New Clopidogrel x 3 weeks, begun 9/27    Amlodipine  resumed with lower dose.   PTA carvedilol , losartan, metformin not continued (also off while inpatient).  Communicate relevant medication changes to patient/family members at discharge from CIR.   Monitor blood pressure for any need to adjust dosage.  Restart or discontinue PTA meds as clinically indicated.   Other       Clinically significant  medication issues were identified that warrant physician communication and completion of prescribed/recommended actions by midnight of the next day:  No  Name of provider notified for urgent issues identified:   Provider Method of Notification:   Pharmacist comments:   - Aspirin  81 mg and Clopidogrel planned for 3 weeks per Neuro rec, then Asprin alone. Clopidogrel begun 9/27 while inpatient > last dose should be on 06/06/24.  Time spent performing this drug regimen review (minutes):  20  Genaro Zebedee Calin, [place] 05/18/2024 8:11 AM

## 2024-05-18 NOTE — Progress Notes (Signed)
 Inpatient Rehabilitation Care Coordinator Assessment and Plan Patient Details  Name: [name] [name] MRN: [id number] Date of Birth: 19-Sep-1945  Today's Date: 05/18/2024  Hospital Problems: Principal Problem:   Stroke (cerebrum) [facility])  Past Medical History:  Past Medical History:  Diagnosis Date   Chronic back pain    stenosis   Diabetes mellitus without complication (HCC)    Enlarged prostate    GERD (gastroesophageal reflux disease)    occasionally will take a zantac(maybe once a month)   History of colon polyps    History of kidney stones    History of stress test    done 10 yrs. ago, as a baseline    Hyperlipidemia    takes Crestor  daily   Hypertension    takes Amlodipine  daily and Lotensin  as well   Stroke [facility])    Past Surgical History:  Past Surgical History:  Procedure Laterality Date   ANTERIOR LAT LUMBAR FUSION Left 12/15/2014   Procedure: ANTERIOR LATERAL LUMBAR FUSION 1 LEVEL;  Surgeon: Oneil Priestly, MD;  Location: [facility] OR;  Service: Orthopedics;  Laterality: Left;  Left sided lateral lumbar interbody fusion, lumbar 3-4, posterior spinal fusion, lumbar 3-4 with instrumentation.   COLONOSCOPY     ESOPHAGOGASTRODUODENOSCOPY     fatty tissue removed from stomach     LUMBAR LAMINECTOMY/DECOMPRESSION MICRODISCECTOMY N/A 07/08/2013   Procedure: LUMBAR LAMINECTOMY/DECOMPRESSION MICRODISCECTOMY;  Surgeon: Oneil Rodgers Priestly, MD;  Location: [facility] OR;  Service: Orthopedics;  Laterality: N/A;  Lumbar 3-4, lumbar 4-5 decompression   RADIOLOGY WITH ANESTHESIA N/A 10/22/2019   Procedure: MRI WITH ANESTHESIA OF LUMBAR SPINE WITHOUT CONTRAST;  Surgeon: Radiologist, Medication, MD;  Location: [facility] OR;  Service: Radiology;  Laterality: N/A;   right ankle surgery     as child    THORACIC AORTIC ENDOVASCULAR STENT GRAFT N/A 10/15/2019   Procedure: THORACIC AORTIC ENDOVASCULAR STENT GRAFT;  Surgeon: Sheree Penne Bruckner, MD;  Location: [facility] OR;  Service: Vascular;  Laterality: N/A;    THORACOABDOMINAL AORTIC ANEURYSM REPAIR Bilateral 05/08/2024   Procedure: REPAIR, ANEURYSM, AORTA, THORACOABDOMINAL;  Surgeon: Sheree Penne Bruckner, MD;  Location: [facility] OR;  Service: Vascular;  Laterality: Bilateral;   TONSILLECTOMY     as a child   ULTRASOUND GUIDANCE FOR VASCULAR ACCESS Bilateral 05/08/2024   Procedure: ULTRASOUND GUIDANCE, FOR VASCULAR ACCESS;  Surgeon: Sheree Penne Bruckner, MD;  Location: [facility] OR;  Service: Vascular;  Laterality: Bilateral;   Social History:  reports that he has quit smoking. His smoking use included cigarettes. He has a 10 pack-year smoking history. He has never used smokeless tobacco. He reports that he does not drink alcohol and does not use drugs.  Family / Support Systems Marital Status: Married Patient Roles: Spouse, Parent, Other (Comment) (retiree) Spouse/Significant Other: Velia [phone] Children: Three local daughter's Other Supports: Friends and church members Anticipated Caregiver: wife Ability/Limitations of Caregiver: Wife has assisted pt prior to admission she is younger than pt Caregiver Availability: 24/7 Family Dynamics: Close knit with family who are involved and supportive. Pt is hopeful he will do well and recover from this surgery. He was doing well after CVA and SNF stay  Social History Preferred language: English Religion: Jewish Cultural Background: NA Education: Charity fundraiser - How often do you need to have someone help you when you read instructions, pamphlets, or other written material from your doctor or pharmacy?: Never Writes: Yes Employment Status: Retired Marine scientist Issues: NA Guardian/Conservator: None-according to MD pt is not fully capable of making his own  decisions while here. Will look toward his wife to make any decisions while here   Abuse/Neglect Abuse/Neglect Assessment Can Be Completed: Yes Physical Abuse: Denies Verbal Abuse: Denies Sexual Abuse:  Denies Exploitation of patient/patient's resources: Denies Self-Neglect: Denies  Patient response to: Social Isolation - How often do you feel lonely or isolated from those around you?: Never  Emotional Status Pt's affect, behavior and adjustment status: Pt is motivated to do well and get back to his mod/i woith rollator like he was prior to admission. He does have a knee issue and hopes this can get better while here. He did have some assist with ADL's from wife prior to admission and after his SNF stay Recent Psychosocial Issues: other health issues Psychiatric History: No issues-according to chart has dementia. Will continue to assess to see if would benefit from seeing neuro-psych while here Substance Abuse History: No issues  Patient / Family Perceptions, Expectations & Goals Pt/Family understanding of illness & functional limitations: Pt is able to explain his surgery and seems to be clearing cognitively. He has spoken with the MD involved, but usually leaves up to his wife his medical issues. Premorbid pt/family roles/activities: husband, father, grandfather, retiree, neighbor, church member Anticipated changes in roles/activities/participation: resume Pt/family expectations/goals: Pt states:  I hope to do well and recover and get my knee straightened out.  Community Resources [employer]: Other (Comment) (Center well active with) Premorbid Home Care/DME Agencies: Other (Comment) (rw, rollator, bsc, ttb, wc, grab bars) Transportation available at discharge: wife Is the patient able to respond to transportation needs?: Yes In the past 12 months, has lack of transportation kept you from medical appointments or from getting medications?: No In the past 12 months, has lack of transportation kept you from meetings, work, or from getting things needed for daily living?: No  Discharge Planning Living Arrangements: Spouse/significant other Support Systems: Spouse/significant  other, Children, Other relatives, Friends/neighbors, Church/faith community Type of Residence: Private residence Insurance Resources: Media planner (specify) ([employer] Medicare) Financial Resources: Social Security, Family Support Financial Screen Referred: No Living Expenses: Own Money Management: Spouse Does the patient have any problems obtaining your medications?: No Home Management: wife Patient/Family Preliminary Plans: Return home with wife who has assisted with his ADL's prior to admission, since his health issues in June. Has been here before in 2021 and recently in a SNF and was released in July 2025. Aware team conference on Weddnesday and will work on discharge needs Care Coordinator Anticipated Follow Up Needs: HH/OP  Clinical Impression Pleasant gentleman who is motivated to do well and return home with wife who was assisting with ADL's prior to admission. Will update on Wednesday once team conference and work on discharge needs.  Raymonde [name] 05/18/2024, 2:13 PM

## 2024-05-18 NOTE — Discharge Instructions (Addendum)
 Inpatient Rehab Discharge Instructions  [name] [name] Discharge date and time: No discharge date for patient encounter.   Activities/Precautions/ Functional Status: Activity: no lifting, driving, or strenuous exercise  TILL CLEARED BY MD Diet: cardiac diet Wound Care: keep wound clean and dry   Functional status:  ___ No restrictions     ___ Walk up steps independently ___ 24/7 supervision/assistance   ___ Walk up steps with assistance ___ Intermittent supervision/assistance  ___ Bathe/dress independently ___ Walk with walker     ___ Bathe/dress with assistance ___ Walk Independently    ___ Shower independently ___ Walk with assistance    _X__ Shower with assistance  X__ No alcohol     ___ Return to work/school ________  Special Instructions:    COMMUNITY REFERRALS UPON DISCHARGE:    Home Health:   PT   OT    SP    AIDE                Agency:[facility] Phone: [phone]    Medical Equipment/Items Ordered:Has all needed equipment from previous admissions, if wants another bedside commode will need to purchase one on own                                                 Agency/Supplier:NA    STROKE/TIA DISCHARGE INSTRUCTIONS SMOKING Cigarette smoking nearly doubles your risk of having a stroke & is the single most alterable risk factor  If you smoke or have smoked in the last 12 months, you are advised to quit smoking for your health. Most of the excess cardiovascular risk related to smoking disappears within a year of stopping. Ask you doctor about anti-smoking medications  Quit Line: 1-800-QUIT NOW Free Smoking Cessation Classes [phone]  CHOLESTEROL Know your levels; limit fat & cholesterol in your diet  Lipid Panel     Component Value Date/Time   CHOL 105 05/16/2024 0330   CHOL 119 11/14/2020 1031   TRIG 99 05/16/2024 0330   HDL 24 (L) 05/16/2024 0330   HDL 44 11/14/2020 1031   CHOLHDL 4.4 05/16/2024 0330   VLDL 20 05/16/2024 0330   LDLCALC  61 05/16/2024 0330   LDLCALC 56 11/14/2020 1031     Many patients benefit from treatment even if their cholesterol is at goal. Goal: Total Cholesterol (CHOL) less than 160 Goal:  Triglycerides (TRIG) less than 150 Goal:  HDL greater than 40 Goal:  LDL (LDLCALC) less than 100   BLOOD PRESSURE American Stroke Association blood pressure target is less that 120/80 mm/Hg  Your discharge blood pressure is:  BP: (!) 149/79 Monitor your blood pressure Limit your salt and alcohol intake Many individuals will require more than one medication for high blood pressure  DIABETES (A1c is a blood sugar average for last 3 months) Goal HGBA1c is under 7% (HBGA1c is blood sugar average for last 3 months)  Diabetes: No known diagnosis of diabetes    Lab Results  Component Value Date   HGBA1C 5.8 (H) 05/06/2024    Your HGBA1c can be lowered with medications, healthy diet, and exercise. Check your blood sugar as directed by your physician Call your physician if you experience unexplained or low blood sugars.  PHYSICAL ACTIVITY/REHABILITATION Goal is 30 minutes at least 4 days per week  Activity: No driving, Therapies: see above Return to work: N/a  Activity decreases your risk of heart attack and stroke and makes your heart stronger.  It helps control your weight and blood pressure; helps you relax and can improve your mood. Participate in a regular exercise program. Talk with your doctor about the best form of exercise for you (dancing, walking, swimming, cycling).  DIET/WEIGHT Goal is to maintain a healthy weight  Your discharge diet is:  Diet Order             Diet heart healthy/carb modified Room service appropriate? Yes; Fluid consistency: Thin  Diet effective now                   liquids Your height is:  Height: 5' 11 (180.3 cm) Your current weight is: Weight: 187 lbs  Your Body Mass Index (BMI) is:  BMI (Calculated): 26.15 Following the type of diet specifically designed for you will  help prevent another stroke. Your goal weight is:  179 lbs Your goal Body Mass Index (BMI) is 19-24. Healthy food habits can help reduce 3 risk factors for stroke:  High cholesterol, hypertension, and excess weight.  RESOURCES Stroke/Support Group:  Call [phone]   STROKE EDUCATION PROVIDED/REVIEWED AND GIVEN TO PATIENT Stroke warning signs and symptoms How to activate emergency medical system (call 911). Medications prescribed at discharge. Need for follow-up after discharge. Personal risk factors for stroke. Pneumonia vaccine given:  Flu vaccine given:  My questions have been answered, the writing is legible, and I understand these instructions.  I will adhere to these goals & educational materials that have been provided to me after my discharge from the hospital.     My questions have been answered and I understand these instructions. I will adhere to these goals and the provided educational materials after my discharge from the hospital.  Patient/Caregiver Signature _______________________________ Date __________  Clinician Signature _______________________________________ Date __________  Please bring this form and your medication list with you to all your follow-up doctor's appointments.

## 2024-05-18 NOTE — Progress Notes (Signed)
  Progress Note    05/18/2024 9:06 AM * No surgery found *  Subjective: No overnight complaints  Vitals:   05/17/24 2123 05/18/24 0437  BP: (!) 155/87 (!) 140/82  Pulse: 73 65  Resp: 17 19  Temp: 98.6 F (37 C) 98.3 F (36.8 C)  SpO2: 99% 100%    Physical Exam: Awake alert and oriented Nonlabored respirations Left axillary incision healing well Bilateral groins are soft without hematoma 2+ palpable bilateral dorsalis pedis pulses  CBC    Component Value Date/Time   WBC 9.8 05/18/2024 0617   RBC 3.04 (L) 05/18/2024 0617   HGB 8.8 (L) 05/18/2024 0617   HGB 12.7 (L) 11/14/2020 1031   HCT 26.8 (L) 05/18/2024 0617   HCT 39.1 11/14/2020 1031   PLT 326 05/18/2024 0617   PLT 157 11/14/2020 1031   MCV 88.2 05/18/2024 0617   MCV 90 11/14/2020 1031   MCH 28.9 05/18/2024 0617   MCHC 32.8 05/18/2024 0617   RDW 14.8 05/18/2024 0617   RDW 15.6 (H) 11/14/2020 1031   LYMPHSABS 1.0 05/17/2024 1908   LYMPHSABS 1.0 11/14/2020 1031   MONOABS 1.0 05/17/2024 1908   EOSABS 0.2 05/17/2024 1908   EOSABS 0.3 11/14/2020 1031   BASOSABS 0.1 05/17/2024 1908   BASOSABS 0.1 11/14/2020 1031    BMET    Component Value Date/Time   NA 138 05/18/2024 0617   NA 145 (H) 11/14/2020 1031   K 4.0 05/18/2024 0617   CL 109 05/18/2024 0617   CO2 20 (L) 05/18/2024 0617   GLUCOSE 109 (H) 05/18/2024 0617   BUN 18 05/18/2024 0617   BUN 12 11/14/2020 1031   CREATININE 1.38 (H) 05/18/2024 0617   CALCIUM  8.5 (L) 05/18/2024 0617   GFRNONAA 52 (L) 05/18/2024 0617   GFRAA >60 05/09/2020 1228    INR    Component Value Date/Time   INR 1.2 05/11/2024 1443     Intake/Output Summary (Last 24 hours) at 05/18/2024 0906 Last data filed at 05/17/2024 2300 Gross per 24 hour  Intake --  Output 100 ml  Net -100 ml      Assessment/Plan:  78 y.o. male is 9 days postop, s/p: 1.  Thoracic aortic aneurysm repair with Gore conformable CTAG 40 x 40 x 10 cm 2.  Endovascular repair of juxtarenal aortic  aneurysm with main body right 37 x 20 x 160 cm Gore excluder thoracoabdominal branched endoprosthesis extended with 23 x 14 x 16 cm Gore excluder and contralateral left limb 16 x 13.5 cm 3.  Stent of celiac artery with 9 x 59 mm VBX, stent of SMA with 9 x 79 mm VBX, stent left renal artery with two 7 x 79 mm VBX, stent of right renal artery with 7 x 59 and 7 x 79 mm VBX 4.  Open exposure left axillary artery 5.  Percutaneous ultrasound-guided cannulation and proglide closure bilateral common femoral arteries 6.  Intravascular ultrasound of aorta  Hopefully will have considerable recovery in CIR.  DAPT per neurology.  [name] [name] C. Sheree, MD [facility] Office: [phone] Pager: [phone]  05/18/2024 9:06 AM

## 2024-05-18 NOTE — Plan of Care (Signed)
  Problem: Consults Goal: [facility] GENERAL PATIENT EDUCATION Description: See Patient Education module for education specifics. Outcome: Progressing   

## 2024-05-18 NOTE — Progress Notes (Signed)
 Inpatient Rehabilitation Center Individual Statement of Services  Patient Name:  [name] [name]  Date:  05/18/2024  Welcome to the Inpatient Rehabilitation Center.  Our goal is to provide you with an individualized program based on your diagnosis and situation, designed to meet your specific needs.  With this comprehensive rehabilitation program, you will be expected to participate in at least 3 hours of rehabilitation therapies Monday-Friday, with modified therapy programming on the weekends.  Your rehabilitation program will include the following services:  Physical Therapy (PT), Occupational Therapy (OT), Speech Therapy (ST), 24 hour per day rehabilitation nursing, Therapeutic Recreaction (TR), Care Coordinator, Rehabilitation Medicine, Nutrition Services, and Pharmacy Services  Weekly team conferences will be held on Wednesday to discuss your progress.  Your Inpatient Rehabilitation Care Coordinator will talk with you frequently to get your input and to update you on team discussions.  Team conferences with you and your family in attendance may also be held.  Expected length of stay: 3 weeks  Overall anticipated outcome: CGA-min level of assist  Depending on your progress and recovery, your program may change. Your Inpatient Rehabilitation Care Coordinator will coordinate services and will keep you informed of any changes. Your Inpatient Rehabilitation Care Coordinator's name and contact numbers are listed  below.  The following services may also be recommended but are not provided by the Inpatient Rehabilitation Center:   Home Health Rehabiltiation Services Outpatient Rehabilitation Services    Arrangements will be made to provide these services after discharge if needed.  Arrangements include referral to agencies that provide these services.  Your insurance has been verified to be:  [facility] medicare Your primary doctor is:  Ardell Manly  Pertinent information will be shared with your  doctor and your insurance company.  Inpatient Rehabilitation Care Coordinator:  Rhoda Clement, KEN [phone] or [name]  Information discussed with and copy given to patient by: Clement [name], 05/18/2024, 2:15 PM

## 2024-05-18 NOTE — Evaluation (Signed)
 Occupational Therapy Assessment and Plan  Patient Details  Name: [name] [name] MRN: [id number] Date of Birth: 11-20-1945  OT Diagnosis: abnormal posture, disturbance of vision, hemiplegia affecting non-dominant side, and muscle weakness (generalized) Rehab Potential: Rehab Potential (ACUTE ONLY): Good ELOS: 3 weeks   Today's Date: 05/18/2024 OT Individual Time: 1000-1058 OT Individual Time Calculation (min): 58 min     Hospital Problem: Principal Problem:   Stroke (cerebrum) (HCC)   Past Medical History:  Past Medical History:  Diagnosis Date   Chronic back pain    stenosis   Diabetes mellitus without complication (HCC)    Enlarged prostate    GERD (gastroesophageal reflux disease)    occasionally will take a zantac(maybe once a month)   History of colon polyps    History of kidney stones    History of stress test    done 10 yrs. ago, as a baseline    Hyperlipidemia    takes Crestor  daily   Hypertension    takes Amlodipine  daily and Lotensin  as well   Stroke [facility])    Past Surgical History:  Past Surgical History:  Procedure Laterality Date   ANTERIOR LAT LUMBAR FUSION Left 12/15/2014   Procedure: ANTERIOR LATERAL LUMBAR FUSION 1 LEVEL;  Surgeon: Oneil Priestly, MD;  Location: [facility] OR;  Service: Orthopedics;  Laterality: Left;  Left sided lateral lumbar interbody fusion, lumbar 3-4, posterior spinal fusion, lumbar 3-4 with instrumentation.   COLONOSCOPY     ESOPHAGOGASTRODUODENOSCOPY     fatty tissue removed from stomach     LUMBAR LAMINECTOMY/DECOMPRESSION MICRODISCECTOMY N/A 07/08/2013   Procedure: LUMBAR LAMINECTOMY/DECOMPRESSION MICRODISCECTOMY;  Surgeon: Oneil Rodgers Priestly, MD;  Location: [facility];  Service: Orthopedics;  Laterality: N/A;  Lumbar 3-4, lumbar 4-5 decompression   RADIOLOGY WITH ANESTHESIA N/A 10/22/2019   Procedure: MRI WITH ANESTHESIA OF LUMBAR SPINE WITHOUT CONTRAST;  Surgeon: Radiologist, Medication, MD;  Location: [facility] OR;  Service: Radiology;   Laterality: N/A;   right ankle surgery     as child    THORACIC AORTIC ENDOVASCULAR STENT GRAFT N/A 10/15/2019   Procedure: THORACIC AORTIC ENDOVASCULAR STENT GRAFT;  Surgeon: Sheree Penne Bruckner, MD;  Location: [facility] OR;  Service: Vascular;  Laterality: N/A;   THORACOABDOMINAL AORTIC ANEURYSM REPAIR Bilateral 05/08/2024   Procedure: REPAIR, ANEURYSM, AORTA, THORACOABDOMINAL;  Surgeon: Sheree Penne Bruckner, MD;  Location: [facility] OR;  Service: Vascular;  Laterality: Bilateral;   TONSILLECTOMY     as a child   ULTRASOUND GUIDANCE FOR VASCULAR ACCESS Bilateral 05/08/2024   Procedure: ULTRASOUND GUIDANCE, FOR VASCULAR ACCESS;  Surgeon: Sheree Penne Bruckner, MD;  Location: [facility] OR;  Service: Vascular;  Laterality: Bilateral;    Assessment & Plan Clinical Impression: Patient is a 78 y.o. year old male with recent admission to the hospital on 9/19 for thoracic aortic aneurysm repair. 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 He is doing well without pain or worsening weakness. Incision site is c/d/I. PMH of CVA, HTN, HLD, GERD, and diabetes.  Patient transferred to CIR on 05/17/2024 .    Patient currently requires total with basic self-care skills secondary to muscle weakness and impaired timing and sequencing and decreased motor planning.  Prior to hospitalization, patient could complete BADL's without assist.  Patient will benefit from skilled intervention to increase independence with basic self-care skills prior to discharge home with care partner.  Anticipate patient will require 24 hour supervision and minimal physical assistance and follow up home health.  OT - End of Session Activity Tolerance: Tolerates 10 - 20 min activity with multiple rests Endurance Deficit: Yes OT Assessment Rehab Potential (ACUTE ONLY): Good OT Barriers to Discharge: Home environment access/layout OT Barriers to Discharge Comments: primary  bedroom and shower on 2nd floor OT Patient demonstrates impairments in the following area(s): Balance;Safety;Cognition;Vision;Endurance;Motor OT Basic ADL's Functional Problem(s): Eating;Grooming;Bathing;Dressing;Toileting OT Advanced ADL's Functional Problem(s): Simple Meal Preparation OT Transfers Functional Problem(s): Toilet;Tub/Shower OT Additional Impairment(s): Fuctional Use of Upper Extremity OT Plan OT Intensity: Minimum of 1-2 x/day, 45 to 90 minutes OT Frequency: 5 out of 7 days OT Duration/Estimated Length of Stay: 3 weeks OT Treatment/Interventions: Balance/vestibular training;Discharge planning;Self Care/advanced ADL retraining;Therapeutic Activities;UE/LE Coordination activities;Visual/perceptual remediation/compensation;Therapeutic Exercise;Patient/family education;Functional mobility training;Cognitive remediation/compensation;Community reintegration;DME/adaptive equipment instruction;Neuromuscular re-education;UE/LE Strength taining/ROM OT Self Feeding Anticipated Outcome(s): Mod I OT Basic Self-Care Anticipated Outcome(s): CGA to set up with seated tasks OT Toileting Anticipated Outcome(s): CGA OT Bathroom Transfers Anticipated Outcome(s): CGA OT Recommendation Recommendations for Other Services: Speech consult;Therapeutic Recreation consult Therapeutic Recreation Interventions: Kitchen group Patient destination: Home Follow Up Recommendations: Home health OT;24 hour supervision/assistance Equipment Recommended: 3 in 1 bedside comode;To be determined   OT Evaluation Precautions/Restrictions  Precautions Precautions: Fall Recall of Precautions/Restrictions: Impaired Precaution/Restrictions Comments: incision near L clavicle (axillary artery) and bil groins Restrictions Weight Bearing Restrictions Per Provider Order: No General Chart Reviewed: Yes Family/Caregiver Present: No Vital Signs Therapy Vitals Temp: 98.8 F (37.1 C) Pulse Rate: 79 Resp: 19 BP:  127/82 Patient Position (if appropriate): Lying Oxygen Therapy SpO2: 95 % O2 Device: Room Air Pain Pain Assessment Pain Scale: 0-10 Pain Score: 0-No pain Faces Pain Scale: No hurt Home Living/Prior Functioning Home Living Family/patient expects to be discharged to:: Private residence Living Arrangements: Spouse/significant other Available Help at Discharge: Family, Available 24 hours/day Type of Home: House Home Access: Stairs to enter Secretary/administrator of Steps: 1 Entrance Stairs-Rails: None Home Layout: Two level, Bed/bath upstairs Alternate Level Stairs-Number of Steps: 7+7 with landing, reports full bath on first floor with tub Bathroom Shower/Tub: Psychologist, counselling, Engineer, manufacturing systems: Handicapped height Bathroom Accessibility: Yes Additional Comments: played guitar  Lives With: Spouse Prior Function Level of Independence: Independent with basic ADLs, Independent with homemaking with ambulation  Able to Take Stairs?: Yes Vocation: Retired Administrator, sports Baseline Vision/History: 1 Wears glasses Ability to See in Adequate Light: 1 Impaired Patient Visual Report: Blurring of vision;Diplopia Vision Assessment?: Vision impaired- to be further tested in functional context Perception  Perception: Impaired Praxis Praxis: Impaired Praxis Impairment Details: Initiation;Motor planning Cognition Cognition Overall Cognitive Status: Impaired/Different from baseline Arousal/Alertness: Awake/alert Memory: Impaired Memory Impairment: Retrieval deficit;Decreased long term memory Awareness: Impaired Problem Solving: Impaired Problem Solving Impairment: Verbal basic Executive Function: Initiating Reasoning: Impaired Initiating: Impaired Safety/Judgment: Impaired Brief Interview for Mental Status (BIMS) Repetition of Three Words (First Attempt): 3 Temporal Orientation: Year: Correct Temporal Orientation: Month: Missed by 6 days to 1 month Temporal Orientation: Day:  Incorrect Recall: Sock: Yes, no cue required Recall: Blue: Yes, no cue required Recall: Bed: Yes, no cue required BIMS Summary Score: 13 Sensation Sensation Light Touch: Appears Intact Hot/Cold: Appears Intact Proprioception: Appears Intact Stereognosis: Appears Intact Coordination Gross Motor Movements are Fluid and Coordinated: No Fine Motor Movements are Fluid and Coordinated: No 9 Hole Peg Test: Left- 46.85; Right 44.38 Motor  Motor Motor: Abnormal postural alignment and control;Motor apraxia  Trunk/Postural Assessment  Lumbar Assessment Lumbar Assessment: Exceptions to [facility] Postural Control Postural Control: Deficits on evaluation  Balance Balance Balance Assessed: Yes Dynamic Sitting Balance  Dynamic Sitting - Balance Support: During functional activity;Feet supported Dynamic Sitting - Level of Assistance: 3: Mod assist Dynamic Sitting Balance - Compensations: posterior lean Dynamic Sitting - Balance Activities: Forward lean/weight shifting;Reaching for Higher education careers adviser Standing - Balance Support: Bilateral upper extremity supported Static Standing - Level of Assistance: 2: Max assist Extremity/Trunk Assessment RUE Assessment RUE Assessment: Within Functional Limits General Strength Comments: grossly at baseline. Grasp- 58#; 2 pt pinch -9; 3 pt pinch- 10; lateral pinch 24# LUE Assessment General Strength Comments: slightly weaker than the RUE. Grasp 46#; 2 pt pinch- 7#; 3pt pinch -8; Lateral pinch 18#  Care Tool Care Tool Self Care Eating   Eating Assist Level: Set up assist    Oral Care    Oral Care Assist Level: Set up assist    Bathing   Body parts bathed by patient: Right arm;Left arm;Chest;Front perineal area;Abdomen;Right upper leg;Left upper leg;Face Body parts bathed by helper: Buttocks;Right lower leg;Left lower leg   Assist Level: Moderate Assistance - Patient 50 - 74%    Upper Body Dressing(including orthotics)   What  is the patient wearing?: Pull over shirt   Assist Level: Moderate Assistance - Patient 50 - 74%    Lower Body Dressing (excluding footwear)   What is the patient wearing?: Pants;Incontinence brief Assist for lower body dressing: Dependent - Patient 0%    Putting on/Taking off footwear   What is the patient wearing?: Non-skid slipper socks Assist for footwear: Dependent - Patient 0%       Care Tool Toileting Toileting activity   Assist for toileting: Dependent - Patient 0%     Care Tool Bed Mobility Roll left and right activity   Roll left and right assist level: Moderate Assistance - Patient 50 - 74%    Sit to lying activity   Sit to lying assist level: Maximal Assistance - Patient 25 - 49%    Lying to sitting on side of bed activity   Lying to sitting on side of bed assist level: the ability to move from lying on the back to sitting on the side of the bed with no back support.: Maximal Assistance - Patient 25 - 49%     Care Tool Transfers Sit to stand transfer   Sit to stand assist level: Total Assistance - Patient < 25%    Chair/bed transfer   Chair/bed transfer assist level: Total Assistance - Patient < 25%     Toilet transfer   Assist Level: Total Assistance - Patient < 25%     Care Tool Cognition  Expression of Ideas and Wants Expression of Ideas and Wants: 3. Some difficulty - exhibits some difficulty with expressing needs and ideas (e.g, some words or finishing thoughts) or speech is not clear  Understanding Verbal and Non-Verbal Content Understanding Verbal and Non-Verbal Content: 3. Usually understands - understands most conversations, but misses some part/intent of message. Requires cues at times to understand   Memory/Recall Ability Memory/Recall Ability : That he or she is in a hospital/hospital unit   Refer to Care Plan for Long Term Goals  SHORT TERM GOAL WEEK 1 OT Short Term Goal 1 (Week 1): Patient to perform toilet transfer with Mod assist of one OT  Short Term Goal 2 (Week 1): Patient to perform UB dressing with Min assist OT Short Term Goal 3 (Week 1): Patient to perform grooming with set up seated at the sink  Recommendations for other services: Adult nurse group   Skilled  Therapeutic Intervention ADL ADL Eating: Set up Grooming: Minimal assistance Where Assessed-Grooming: Sitting at sink Upper Body Bathing: Minimal assistance Where Assessed-Upper Body Bathing: Wheelchair;Sitting at sink Lower Body Bathing: Dependent Where Assessed-Lower Body Bathing: Sitting at sink;Wheelchair Upper Body Dressing: Moderate assistance Where Assessed-Upper Body Dressing: Wheelchair Lower Body Dressing: Dependent Where Assessed-Lower Body Dressing: Wheelchair Toileting: Dependent Toilet Transfer: Dependent Statistician Method: Surveyor, minerals: Grab bars;Raised toilet seat Tub/Shower Transfer: Unable to assess Tub/Shower Transfer Method: Unable to assess Film/video editor: Unable to assess Film/video editor Method: Unable to assess ADL Comments: Not safe for shower stall transfer, Patient with significant posterior lean and motor planning deficits. Mobility  Bed Mobility Bed Mobility: Rolling Left;Supine to Sit Rolling Left: Moderate Assistance - Patient 50-74% Supine to Sit: Maximal Assistance - Patient - Patient 25-49%   Discharge Criteria: Patient will be discharged from OT if patient refuses treatment 3 consecutive times without medical reason, if treatment goals not met, if there is a change in medical status, if patient makes no progress towards goals or if patient is discharged from hospital.  The above assessment, treatment plan, treatment alternatives and goals were discussed and mutually agreed upon: by patient  Isaiah JONETTA Freund 05/18/2024, 1:02 PM

## 2024-05-18 NOTE — Plan of Care (Signed)
  Problem: RH Eating Goal: LTG Patient will perform eating w/assist, cues/equip (OT) Description: LTG: Patient will perform eating with assist, with/without cues using equipment (OT) Flowsheets (Taken 05/18/2024 1231) LTG: Pt will perform eating with assistance level of: Independent with assistive device    Problem: RH Grooming Goal: LTG Patient will perform grooming w/assist,cues/equip (OT) Description: LTG: Patient will perform grooming with assist, with/without cues using equipment (OT) Flowsheets (Taken 05/18/2024 1231) LTG: Pt will perform grooming with assistance level of: Independent with assistive device    Problem: RH Bathing Goal: LTG Patient will bathe all body parts with assist levels (OT) Description: LTG: Patient will bathe all body parts with assist levels (OT) Flowsheets (Taken 05/18/2024 1231) LTG: Pt will perform bathing with assistance level/cueing: Contact Guard/Touching assist LTG: Position pt will perform bathing: Shower   Problem: RH Dressing Goal: LTG Patient will perform upper body dressing (OT) Description: LTG Patient will perform upper body dressing with assist, with/without cues (OT). Flowsheets (Taken 05/18/2024 1231) LTG: Pt will perform upper body dressing with assistance level of: Set up assist Goal: LTG Patient will perform lower body dressing w/assist (OT) Description: LTG: Patient will perform lower body dressing with assist, with/without cues in positioning using equipment (OT) Flowsheets (Taken 05/18/2024 1231) LTG: Pt will perform lower body dressing with assistance level of: Contact Guard/Touching assist   Problem: RH Toileting Goal: LTG Patient will perform toileting task (3/3 steps) with assistance level (OT) Description: LTG: Patient will perform toileting task (3/3 steps) with assistance level (OT)  Flowsheets (Taken 05/18/2024 1231) LTG: Pt will perform toileting task (3/3 steps) with assistance level: Contact Guard/Touching assist   Problem:  RH Functional Use of Upper Extremity Goal: LTG Patient will use RT/LT upper extremity as a (OT) Description: LTG: Patient will use right/left upper extremity as a stabilizer/gross assist/diminished/nondominant/dominant level with assist, with/without cues during functional activity (OT) Flowsheets (Taken 05/18/2024 1231) LTG: Use of upper extremity in functional activities: LUE as nondominant level   Problem: RH Simple Meal Prep Goal: LTG Patient will perform simple meal prep w/assist (OT) Description: LTG: Patient will perform simple meal prep with assistance, with/without cues (OT). Flowsheets (Taken 05/18/2024 1231) LTG: Pt will perform simple meal prep with assistance level of: Supervision/Verbal cueing LTG: Pt will perform simple meal prep w/level of: Wheelchair level   Problem: RH Toilet Transfers Goal: LTG Patient will perform toilet transfers w/assist (OT) Description: LTG: Patient will perform toilet transfers with assist, with/without cues using equipment (OT) Flowsheets (Taken 05/18/2024 1231) LTG: Pt will perform toilet transfers with assistance level of: Contact Guard/Touching assist   Problem: RH Tub/Shower Transfers Goal: LTG Patient will perform tub/shower transfers w/assist (OT) Description: LTG: Patient will perform tub/shower transfers with assist, with/without cues using equipment (OT) Flowsheets (Taken 05/18/2024 1231) LTG: Pt will perform tub/shower stall transfers with assistance level of: Contact Guard/Touching assist LTG: Pt will perform tub/shower transfers from: Walk in shower

## 2024-05-18 NOTE — Evaluation (Signed)
 Physical Therapy Assessment and Plan  Patient Details  Name: [name] [name] MRN: [id number] Date of Birth: 02/20/1946  PT Diagnosis: Difficulty walking, Hemiplegia non-dominant, Impaired cognition, Muscle weakness, and Pain in incision areas/groin Rehab Potential: Good ELOS: 3 weeks   Today's Date: 05/18/2024 PT Individual Time: 8695-8584 PT Individual Time Calculation (min): 71 min    Hospital Problem: Principal Problem:   Stroke (cerebrum) (HCC)   Past Medical History:  Past Medical History:  Diagnosis Date   Chronic back pain    stenosis   Diabetes mellitus without complication (HCC)    Enlarged prostate    GERD (gastroesophageal reflux disease)    occasionally will take a zantac(maybe once a month)   History of colon polyps    History of kidney stones    History of stress test    done 10 yrs. ago, as a baseline    Hyperlipidemia    takes Crestor  daily   Hypertension    takes Amlodipine  daily and Lotensin  as well   Stroke [facility])    Past Surgical History:  Past Surgical History:  Procedure Laterality Date   ANTERIOR LAT LUMBAR FUSION Left 12/15/2014   Procedure: ANTERIOR LATERAL LUMBAR FUSION 1 LEVEL;  Surgeon: Oneil Priestly, MD;  Location: [facility] OR;  Service: Orthopedics;  Laterality: Left;  Left sided lateral lumbar interbody fusion, lumbar 3-4, posterior spinal fusion, lumbar 3-4 with instrumentation.   COLONOSCOPY     ESOPHAGOGASTRODUODENOSCOPY     fatty tissue removed from stomach     LUMBAR LAMINECTOMY/DECOMPRESSION MICRODISCECTOMY N/A 07/08/2013   Procedure: LUMBAR LAMINECTOMY/DECOMPRESSION MICRODISCECTOMY;  Surgeon: Oneil Rodgers Priestly, MD;  Location: [facility] OR;  Service: Orthopedics;  Laterality: N/A;  Lumbar 3-4, lumbar 4-5 decompression   RADIOLOGY WITH ANESTHESIA N/A 10/22/2019   Procedure: MRI WITH ANESTHESIA OF LUMBAR SPINE WITHOUT CONTRAST;  Surgeon: Radiologist, Medication, MD;  Location: [facility] OR;  Service: Radiology;  Laterality: N/A;   right ankle surgery      as child    THORACIC AORTIC ENDOVASCULAR STENT GRAFT N/A 10/15/2019   Procedure: THORACIC AORTIC ENDOVASCULAR STENT GRAFT;  Surgeon: Sheree Penne Bruckner, MD;  Location: [facility] OR;  Service: Vascular;  Laterality: N/A;   THORACOABDOMINAL AORTIC ANEURYSM REPAIR Bilateral 05/08/2024   Procedure: REPAIR, ANEURYSM, AORTA, THORACOABDOMINAL;  Surgeon: Sheree Penne Bruckner, MD;  Location: [facility] OR;  Service: Vascular;  Laterality: Bilateral;   TONSILLECTOMY     as a child   ULTRASOUND GUIDANCE FOR VASCULAR ACCESS Bilateral 05/08/2024   Procedure: ULTRASOUND GUIDANCE, FOR VASCULAR ACCESS;  Surgeon: Sheree Penne Bruckner, MD;  Location: [facility] OR;  Service: Vascular;  Laterality: Bilateral;    Assessment & Plan Clinical Impression: Patient is a 78 year old male with a PMH of CVA, HTN, HLD, GERD, and diabetes, who is 9 days postop from thoracic aortic aneurysm repair. 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 He is doing well without pain or worsening weakness. Incision site is c/d/I. He is stable for admission to CIR.  Patient transferred to CIR on 05/17/2024 .   Patient currently requires max with mobility secondary to muscle weakness and muscle joint tightness, decreased cardiorespiratoy endurance, impaired timing and sequencing, unbalanced muscle activation, motor apraxia, decreased coordination, and decreased motor planning, decreased visual perceptual skills, decreased midline orientation, decreased awareness, decreased problem solving, decreased memory, and delayed processing, and decreased sitting balance, decreased standing balance, decreased postural control, hemiplegia, and decreased balance strategies.  Prior to hospitalization, patient was  modified independent  with mobility and lived with Spouse in a House home.  Home access is 1Stairs to enter.  Patient will benefit from skilled PT intervention to maximize safe functional  mobility, minimize fall risk, and decrease caregiver burden for planned discharge home with 24 hour supervision.  Anticipate patient will benefit from follow up HH at discharge.  PT - End of Session Activity Tolerance: Decreased this session;Tolerates 30+ min activity with multiple rests Endurance Deficit: Yes PT Assessment Rehab Potential (ACUTE/IP ONLY): Good PT Barriers to Discharge: Home environment access/layout PT Barriers to Discharge Comments: pt has 1 step to enter, unsure of how much assist wife can provide PT Patient demonstrates impairments in the following area(s): Balance;Endurance;Motor;Pain;Perception;Safety;Skin Integrity PT Transfers Functional Problem(s): Bed Mobility;Bed to Chair;Car;Furniture PT Locomotion Functional Problem(s): Ambulation;Wheelchair Mobility;Stairs PT Plan PT Intensity: Minimum of 1-2 x/day ,45 to 90 minutes PT Frequency: 5 out of 7 days PT Duration Estimated Length of Stay: 3 weeks PT Treatment/Interventions: Ambulation/gait training;Balance/vestibular training;Cognitive remediation/compensation;Community reintegration;Discharge planning;DME/adaptive equipment instruction;Disease management/prevention;Functional electrical stimulation;Functional mobility training;Neuromuscular re-education;Pain management;Psychosocial support;Patient/family education;Skin care/wound management;Splinting/orthotics;Stair training;Therapeutic Activities;Therapeutic Exercise;UE/LE Strength taining/ROM;UE/LE Coordination activities;Visual/perceptual remediation/compensation;Wheelchair propulsion/positioning PT Transfers Anticipated Outcome(s): CGA for transfers w/c level, min A car transfer PT Locomotion Anticipated Outcome(s): S w/c mobility, min A short distance gait PT Recommendation Recommendations for Other Services: Neuropsych consult;Therapeutic Recreation consult Therapeutic Recreation Interventions: Kitchen group;Stress management Follow Up Recommendations: Home  health PT;24 hour supervision/assistance Patient destination: Home Equipment Recommended: Wheelchair (measurements);Wheelchair cushion (measurements) Equipment Details: pt reports he has RW and rollator at home   PT Evaluation Precautions/Restrictions Precautions Precautions: Fall Recall of Precautions/Restrictions: Impaired Precaution/Restrictions Comments: incision near L clavicle (axillary artery) and bil groins; L weakness Restrictions Weight Bearing Restrictions Per Provider Order: No  Pain Denies pain initially but reports pain in groin with movement/stretching from incision. Repositioned as needed. Pain Interference Pain Interference Pain Effect on Sleep: 1. Rarely or not at all Pain Interference with Therapy Activities: 1. Rarely or not at all Pain Interference with Day-to-Day Activities: 1. Rarely or not at all Home Living/Prior Functioning Home Living Living Arrangements: Spouse/significant other Available Help at Discharge: Family;Available 24 hours/day Type of Home: House Home Access: Stairs to enter [employer] of Steps: 1 Entrance Stairs-Rails: None Home Layout: Two level;Bed/bath upstairs Alternate Level Stairs-Number of Steps: 7+7 with landing, reports full bath on first floor with tub Bathroom Shower/Tub: Walk-in shower;Tub/shower unit Bathroom Toilet: Handicapped height Bathroom Accessibility: Yes Additional Comments: played guitar  Lives With: Spouse Prior Function Level of Independence: Independent with gait;Independent with transfers;Requires assistive device for independence  Able to Take Stairs?: Yes Vocation: Retired Vision/Perception  Vision - History Baseline Vision: Other (comment) (reports he just wears them for looks) Ability to See in Adequate Light: 1 Impaired Patient Visual Report: Other (comment) (difficult to describe - initially pt states no changes but then states his vision has changes and note squinting closed R  eye) Perception Perception: Impaired Preception Impairment Details: Inattention/Neglect Praxis Praxis: Impaired Praxis Impairment Details: Motor planning  Cognition Overall Cognitive Status: Impaired/Different from baseline Arousal/Alertness: Awake/alert Memory: Impaired Memory Impairment: Retrieval deficit;Decreased long term memory Awareness: Impaired Problem Solving: Impaired Problem Solving Impairment: Verbal basic Executive Function: Initiating Reasoning: Impaired Initiating: Impaired Safety/Judgment: Appears intact Sensation Sensation Light Touch: Appears Intact Hot/Cold: Appears Intact Proprioception: Appears Intact Stereognosis: Appears Intact Coordination Gross Motor Movements are Fluid and Coordinated: No Fine Motor Movements are Fluid and Coordinated: No 9 Hole Peg Test: Left- 46.85; Right 44.38 Motor  Motor Motor: Abnormal postural alignment and  control;Hemiplegia Motor - Skilled Clinical Observations: L hemiplegia (from old CVA but difficult ot determine how much is also new)   Trunk/Postural Assessment  Cervical Assessment Cervical Assessment: Exceptions to [facility] (forward head) Thoracic Assessment Thoracic Assessment: Exceptions to [facility] (flexed posture) Lumbar Assessment Lumbar Assessment: Exceptions to [facility] (posterior tilt and difficulty with dissociation) Postural Control Postural Control: Deficits on evaluation Trunk Control: posterior lean, decreased awareness or strength to correct Righting Reactions: delayed and inadequate  Balance Balance Balance Assessed: Yes Dynamic Sitting Balance Dynamic Sitting - Balance Support: During functional activity;Feet supported Dynamic Sitting - Level of Assistance: 3: Mod assist Dynamic Sitting Balance - Compensations: posterior lean Dynamic Sitting - Balance Activities: Forward lean/weight shifting;Reaching for Higher education careers adviser Standing - Balance Support: Bilateral upper extremity  supported Static Standing - Level of Assistance: 3: Mod assist;2: Max assist Extremity Assessment  RUE Assessment RUE Assessment: Within Functional Limits General Strength Comments: grossly at baseline. Grasp- 58#; 2 pt pinch -9; 3 pt pinch- 10; lateral pinch 24# LUE Assessment General Strength Comments: slightly weaker than the RUE. Grasp 46#; 2 pt pinch- 7#; 3pt pinch -8; Lateral pinch 18# RLE Assessment General Strength Comments: 3/5 to 3+/5 grossly LLE Assessment General Strength Comments: 2-/5 hip (pain limiting also), 3-/5 knee extension/flexion and 3/5 ankle PF/DF  Care Tool Care Tool Bed Mobility Roll left and right activity   Roll left and right assist level: Moderate Assistance - Patient 50 - 74%    Sit to lying activity   Sit to lying assist level: Maximal Assistance - Patient 25 - 49%    Lying to sitting on side of bed activity   Lying to sitting on side of bed assist level: the ability to move from lying on the back to sitting on the side of the bed with no back support.: Maximal Assistance - Patient 25 - 49%     Care Tool Transfers Sit to stand transfer   Sit to stand assist level: Total Assistance - Patient < 25%    Chair/bed transfer   Chair/bed transfer assist level: Dependent - Armed forces operational officer transfer activity did not occur: Safety/medical concerns        Care Tool Locomotion Ambulation Ambulation activity did not occur: Safety/medical concerns        Walk 10 feet activity Walk 10 feet activity did not occur: Safety/medical concerns       Walk 50 feet with 2 turns activity Walk 50 feet with 2 turns activity did not occur: Safety/medical concerns      Walk 150 feet activity Walk 150 feet activity did not occur: Safety/medical concerns      Walk 10 feet on uneven surfaces activity Walk 10 feet on uneven surfaces activity did not occur: Safety/medical concerns      Stairs Stair activity did not occur: Safety/medical concerns         Walk up/down 1 step activity Walk up/down 1 step or curb (drop down) activity did not occur: Safety/medical concerns      Walk up/down 4 steps activity Walk up/down 4 steps activity did not occur: Safety/medical concerns      Walk up/down 12 steps activity Walk up/down 12 steps activity did not occur: Safety/medical concerns      Pick up small objects from floor Pick up small object from the floor (from standing position) activity did not occur: Safety/medical concerns      Wheelchair Is the patient using a wheelchair?: Yes Type of  Wheelchair: Manual     Max wheelchair distance: 150  Wheel 50 feet with 2 turns activity   Assist Level: Contact Guard/Touching assist  Wheel 150 feet activity   Assist Level: Contact Guard/Touching assist    Refer to Care Plan for Long Term Goals  SHORT TERM GOAL WEEK 1 PT Short Term Goal 1 (Week 1): Pt will be able to perform bed mobility with mod A PT Short Term Goal 2 (Week 1): Pt will be able to perform transfers bed <> w/c with mod A PT Short Term Goal 3 (Week 1): Pt will initiate gait PT Short Term Goal 4 (Week 1): Pt will demonstrate dynamic sitting balance with min A  Recommendations for other services: Neuropsych and Therapeutic Recreation  Kitchen group and Stress management  Skilled Therapeutic Intervention Evaluation completed (see details above and below) with education on PT POC and goals and individual treatment initiated with focus on NMR to address postural control retraining, sit <> stands and transitional movement with Stedy, w/c mobility for BUE strengthening, mobility and NMR to LUE, and transfers scoot/squat pivot. Pt demonstrates decreased trunk control and initiation but with extra time for processing, does better on his own than when trying to assist. Does tend to lose balance posteriorly with LOB occurs. Required heavy max A for sit . Stand with Stedy from recliner and difficulty at first achieving full upright  positioning. From perched position was able to perform with S. Trialed again from w/c which was a little higher but still required heavy mod to max A for power up, once able to push through BLE did much better (motor planning?). Extra time for w/c propulsion with BUE with cues for attention at time to use of LUE x 150' and occasional touching assist. Focused on NMR during squat/scoot pivot transfers to focus on motor planning, body mechanics, and head hips relationship - performed with min A and extra time to the R but mod to max to the L and extra time needed for processing, cues for hand placement and technique. Pt tends to do better when given time to figure out vs cues noted. End of session returned to room and set up with all needs in reach.   Mobility Bed Mobility Bed Mobility: Rolling Left;Supine to Sit Rolling Left: Moderate Assistance - Patient 50-74% Supine to Sit: Maximal Assistance - Patient - Patient 25-49% Transfers Transfers: Transfer via Financial trader;Lateral/Scoot Transfers Squat Pivot Transfers: Maximal Assistance - Patient 25-49% Lateral/Scoot Transfers:  (min A to the R but mod/Max A to the L)  Psychologist, counselling Mobility: Yes Wheelchair Assistance: Veterinary surgeon: Both upper extremities Wheelchair Parts Management: Needs assistance Distance: 150   Discharge Criteria: Patient will be discharged from PT if patient refuses treatment 3 consecutive times without medical reason, if treatment goals not met, if there is a change in medical status, if patient makes no progress towards goals or if patient is discharged from hospital.  The above assessment, treatment plan, treatment alternatives and goals were discussed and mutually agreed upon: by patient  Elnor Donald Sherrell Donald WENDI Elnor, PT, DPT, CBIS  05/18/2024, 3:25 PM

## 2024-05-18 NOTE — Evaluation (Signed)
 Speech Language Pathology Assessment and Plan  Patient Details  Name: [name] [name] MRN: [id number] Date of Birth: 1945/11/09  SLP Diagnosis: Cognitive Impairments  Rehab Potential: Good ELOS: 3 weeks    Today's Date: 05/18/2024 SLP Individual Time: 1100-1158 SLP Individual Time Calculation (min): 58 min   Hospital Problem: Principal Problem:   Stroke (cerebrum) (HCC)  Past Medical History:  Past Medical History:  Diagnosis Date   Chronic back pain    stenosis   Diabetes mellitus without complication (HCC)    Enlarged prostate    GERD (gastroesophageal reflux disease)    occasionally will take a zantac(maybe once a month)   History of colon polyps    History of kidney stones    History of stress test    done 10 yrs. ago, as a baseline    Hyperlipidemia    takes Crestor  daily   Hypertension    takes Amlodipine  daily and Lotensin  as well   Stroke [name])    Past Surgical History:  Past Surgical History:  Procedure Laterality Date   ANTERIOR LAT LUMBAR FUSION Left 12/15/2014   Procedure: ANTERIOR LATERAL LUMBAR FUSION 1 LEVEL;  Surgeon: Oneil Priestly, MD;  Location: [facility] OR;  Service: Orthopedics;  Laterality: Left;  Left sided lateral lumbar interbody fusion, lumbar 3-4, posterior spinal fusion, lumbar 3-4 with instrumentation.   COLONOSCOPY     ESOPHAGOGASTRODUODENOSCOPY     fatty tissue removed from stomach     LUMBAR LAMINECTOMY/DECOMPRESSION MICRODISCECTOMY N/A 07/08/2013   Procedure: LUMBAR LAMINECTOMY/DECOMPRESSION MICRODISCECTOMY;  Surgeon: Oneil Rodgers Priestly, MD;  Location: [facility] OR;  Service: Orthopedics;  Laterality: N/A;  Lumbar 3-4, lumbar 4-5 decompression   RADIOLOGY WITH ANESTHESIA N/A 10/22/2019   Procedure: MRI WITH ANESTHESIA OF LUMBAR SPINE WITHOUT CONTRAST;  Surgeon: Radiologist, Medication, MD;  Location: [facility] OR;  Service: Radiology;  Laterality: N/A;   right ankle surgery     as child    THORACIC AORTIC ENDOVASCULAR STENT GRAFT N/A 10/15/2019    Procedure: THORACIC AORTIC ENDOVASCULAR STENT GRAFT;  Surgeon: Sheree Penne Bruckner, MD;  Location: [facility] OR;  Service: Vascular;  Laterality: N/A;   THORACOABDOMINAL AORTIC ANEURYSM REPAIR Bilateral 05/08/2024   Procedure: REPAIR, ANEURYSM, AORTA, THORACOABDOMINAL;  Surgeon: Sheree Penne Bruckner, MD;  Location: [facility] OR;  Service: Vascular;  Laterality: Bilateral;   TONSILLECTOMY     as a child   ULTRASOUND GUIDANCE FOR VASCULAR ACCESS Bilateral 05/08/2024   Procedure: ULTRASOUND GUIDANCE, FOR VASCULAR ACCESS;  Surgeon: Sheree Penne Bruckner, MD;  Location: [facility] OR;  Service: Vascular;  Laterality: Bilateral;    Assessment / Plan / Recommendation Clinical Impression [name] [name] is a 78 year old male with a PMH of CVA, HTN, HLD, GERD, and diabetes, who is 9 days postop from thoracic aortic aneurysm repair. 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 He is doing well without pain or worsening weakness. Incision site is c/d/I. He is stable for admission to CIR today.   SLP administered SLUMS examination to assess overall cognitive function. Patient scored 14/30 on SLUMS and presents with moderate short term memory, working memory, processing speed, problem solving, and attention deficits. During clock drawing subtest, patient demonstrated severe visual organizational deficits as well as mod to severe information processing deficits. Patient required errorless learning for functional problem solving, including utilization of call light and upcoming schedule. Patient demonstrated adequate stimulability to semantic cue and/or binary choices when recalling word list. Patient perseverated on staffs  return, but demonstrated a positive response to education/encouragement. Oral mechanism examination administered to assess facial and oral structures are working properly for speech and swallowing. Reduced L sided tongue strengthen noted,  unknown if new vs residual deficits from previous CVA. Symmetry, strength, and ROM WNL for other facial and oral structures. During volitional cough, patient grabbed stomach and facial grimaces were observed. Patient vocalized discomfort in chest and stomach when coughing. Patient expressed no difficulty with regular thin liquid diet. SLP observed trial of thin liquid via straw. No overt s/sx of penetration or aspiration present. 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 Also endorsed decline in cognitive function this stay. Patient would benefit from speech therapy services to enhance cognitive function and subsequently reduce caregiver burden and maximize patient independence.      Skilled Therapeutic Interventions          SLP facilitated a cognitive-linguistic evaluation and brief bedside swallow screen to assess pt's cognitive-communication skills and determine need for additional skilled ST services. See above for more information.     SLP Assessment  Patient will need skilled Speech Lanaguage Pathology Services during CIR admission    Recommendations  SLP Diet Recommendations: Age appropriate regular solids;Thin Liquid Administration via: Straw Medication Administration: Whole meds with liquid Supervision: Intermittent supervision to cue for compensatory strategies Compensations: Minimize environmental distractions;Slow rate;Small sips/bites Postural Changes and/or Swallow Maneuvers: Seated upright 90 degrees;Upright 30-60 min after meal Oral Care Recommendations: Oral care BID Recommendations for Other Services: Neuropsych consult;Therapeutic Recreation consult Therapeutic Recreation Interventions: Pet therapy Patient destination: Home Follow up Recommendations: 24 hour supervision/assistance;Outpatient SLP Equipment Recommended: None recommended by SLP    SLP Frequency 3 to 5 out of 7 days   SLP  Duration  SLP Intensity  SLP Treatment/Interventions 3 weeks  Minumum of 1-2 x/day, 30 to 90 minutes  Cognitive remediation/compensation;Functional tasks;Cueing hierarchy;Patient/family education;Internal/external aids;Therapeutic Activities;Therapeutic Exercise;Speech/Language facilitation    Pain Pain Assessment Pain Scale: 0-10 Pain Score: 0-No pain Faces Pain Scale: No hurt  Prior Functioning Cognitive/Linguistic Baseline: Baseline deficits Baseline deficit details: mild dementia per EMR Type of Home: House  Lives With: Spouse Available Help at Discharge: Family Vocation: Retired  SLP Evaluation Cognition Overall Cognitive Status: Impaired/Different from baseline Arousal/Alertness: Awake/alert Orientation Level: Oriented X4 Year: 2025 Month: September Day of Week: Correct Attention: Sustained Sustained Attention: Impaired Sustained Attention Impairment: Verbal basic;Functional basic Memory: Impaired Memory Impairment: Retrieval deficit;Decreased short term memory Decreased Short Term Memory: Verbal basic;Functional basic Awareness: Impaired Awareness Impairment: Emergent impairment Problem Solving: Impaired Problem Solving Impairment: Verbal basic;Functional basic Executive Function: Self Monitoring;Self Correcting;Initiating Reasoning: Impaired Initiating: Impaired Self Monitoring: Impaired Self Correcting: Impaired Behaviors: Perseveration Safety/Judgment: Appears intact  Comprehension Auditory Comprehension Overall Auditory Comprehension: Appears within functional limits for tasks assessed Interfering Components: Attention;Processing speed EffectiveTechniques: Repetition;Increased volume;Stressing words Visual Recognition/Discrimination Discrimination: Not tested Reading Comprehension Reading Status: Not tested Expression Expression Primary Mode of Expression: Verbal Verbal Expression Overall Verbal Expression: Impaired Initiation: No  impairment Naming: Impairment Divergent: 50-74% accurate Other Naming Comments: mild to moderate thought formulation deficits noted as well Pragmatics: No impairment Interfering Components: Attention Effective Techniques: Semantic cues Written Expression Dominant Hand: Right Written Expression: Not tested Oral Motor Oral Motor/Sensory Function Overall Oral Motor/Sensory Function: Mild impairment Facial ROM: Within Functional Limits Facial Symmetry: Within Functional Limits Facial Strength: Within Functional Limits Lingual ROM: Within Functional Limits Lingual Symmetry: Within Functional Limits Lingual Strength: Reduced (L) Motor Speech Overall Motor Speech: Appears  within functional limits for tasks assessed Intelligibility: Intelligible  Care Tool Care Tool Cognition Ability to hear (with hearing aid or hearing appliances if normally used Ability to hear (with hearing aid or hearing appliances if normally used): 0. Adequate - no difficulty in normal conservation, social interaction, listening to TV   Expression of Ideas and Wants Expression of Ideas and Wants: 3. Some difficulty - exhibits some difficulty with expressing needs and ideas (e.g, some words or finishing thoughts) or speech is not clear   Understanding Verbal and Non-Verbal Content Understanding Verbal and Non-Verbal Content: 3. Usually understands - understands most conversations, but misses some part/intent of message. Requires cues at times to understand  Memory/Recall Ability Memory/Recall Ability : That he or she is in a hospital/hospital unit;Current season   Motor Speech Assessment  Intelligibility: Intelligible  Bedside Swallowing Assessment See clinical impressions  Short Term Goals: Week 1: SLP Short Term Goal 1 (Week 1): Pt will recall recent/relevant info, utilizing memory aids as needed given modA SLP Short Term Goal 2 (Week 1): Pt will sustain attention for ~10 mins w/ modA SLP Short Term Goal 3  (Week 1): Pt will complete verbal organization/thought formulation tasks w/ minA SLP Short Term Goal 4 (Week 1): Pt will solve functional problems w/ modA SLP Short Term Goal 5 (Week 1): Pt will process mildly complex information w/ modA  Refer to Care Plan for Long Term Goals  Recommendations for other services: Neuropsych and Therapeutic Recreation  Pet therapy  Discharge Criteria: Patient will be discharged from SLP if patient refuses treatment 3 consecutive times without medical reason, if treatment goals not met, if there is a change in medical status, if patient makes no progress towards goals or if patient is discharged from hospital.  The above assessment, treatment plan, treatment alternatives and goals were discussed and mutually agreed upon: by patient  [name] [name] 05/18/2024, 4:25 PM

## 2024-05-18 NOTE — Progress Notes (Addendum)
 PROGRESS NOTE   Subjective/Complaints:  Discussed LLE weakness with Vascular surgery.   Per pt , LLE weakness may have predated hospitalization but could not give detailed timeline  Responses delayed, limited historian  ROS- denies CP, SOB, N/V/D  Objective:   VAS US  LOWER EXTREMITY VENOUS (DVT) Result Date: 05/17/2024  Lower Venous DVT Study Patient Name:  [name] [name] Cass  Date of Exam:   05/17/2024 Medical Rec #: [id number]       Accession #:    [id number] Date of Birth: 1946-02-26       Patient Gender: M Patient Age:   78 years Exam Location:  [facility] Procedure:      VAS US  LOWER EXTREMITY VENOUS (DVT) Referring Phys: ARY XU --------------------------------------------------------------------------------  Indications: Stroke (embolic shower).  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 Limitations: Poor ultrasound/tissue interface and edema. Comparison Study: Prior negative bilateral LEV done 10/09/19 Performing Technologist: Alberta Lis RVS  Examination Guidelines: A complete evaluation includes B-mode imaging, spectral Doppler, color Doppler, and power Doppler as needed of all accessible portions of each vessel. Bilateral testing is considered an integral part of a complete examination. Limited examinations for reoccurring indications may be performed as noted. The reflux portion of the exam is performed with the patient in reverse Trendelenburg.  +---------+---------------+---------+-----------+----------+-------------------+ RIGHT    CompressibilityPhasicitySpontaneityPropertiesThrombus Aging      +---------+---------------+---------+-----------+----------+-------------------+ CFV      Full           Yes      No                                       +---------+---------------+---------+-----------+----------+-------------------+ SFJ      Full                                                              +---------+---------------+---------+-----------+----------+-------------------+ FV Prox  Full           Yes      No                                       +---------+---------------+---------+-----------+----------+-------------------+ FV Mid   Full                                                             +---------+---------------+---------+-----------+----------+-------------------+ FV DistalFull                                                             +---------+---------------+---------+-----------+----------+-------------------+  PFV      Full                                                             +---------+---------------+---------+-----------+----------+-------------------+ POP                     Yes      No                   patent by color and                                                       Doppler             +---------+---------------+---------+-----------+----------+-------------------+ PTV      Full                                                             +---------+---------------+---------+-----------+----------+-------------------+ PERO     Full                                                             +---------+---------------+---------+-----------+----------+-------------------+   +---------+---------------+---------+-----------+----------+-----------------+ LEFT     CompressibilityPhasicitySpontaneityPropertiesThrombus Aging    +---------+---------------+---------+-----------+----------+-----------------+ CFV      Partial        Yes      Yes                  Age Indeterminate +---------+---------------+---------+-----------+----------+-----------------+ FV Prox  Full           Yes      Yes                                    +---------+---------------+---------+-----------+----------+-----------------+ FV Mid   Full           Yes      Yes                                     +---------+---------------+---------+-----------+----------+-----------------+ FV DistalFull           Yes      Yes                                    +---------+---------------+---------+-----------+----------+-----------------+ PFV      Full           Yes      Yes                                    +---------+---------------+---------+-----------+----------+-----------------+  POP                     Yes      Yes                  Age Indeterminate +---------+---------------+---------+-----------+----------+-----------------+ PTV      None                                         Age Indeterminate +---------+---------------+---------+-----------+----------+-----------------+ PERO     None                                         Age Indeterminate +---------+---------------+---------+-----------+----------+-----------------+ EIV                     Yes      Yes                  patent            +---------+---------------+---------+-----------+----------+-----------------+     Summary: RIGHT: - There is no evidence of deep vein thrombosis in the lower extremity. However, portions of this examination were limited- see technologist comments above.  LEFT: - Findings consistent with partially occlusive, age indeterminate deep vein thrombosis involving the left common femoral and left popliteal veins, and age indeterminate, occlusive deep vein thrombosis involving the left posterior tibial and left peroneal  veins. The External iliac vein appears patent.   *See table(s) above for measurements and observations.    Preliminary    CT ANGIO HEAD NECK W WO CM Result Date: 05/17/2024 CLINICAL DATA:  Initial evaluation for stroke. EXAM: CT ANGIOGRAPHY HEAD AND NECK WITH AND WITHOUT CONTRAST TECHNIQUE: Multidetector CT imaging of the head and neck was performed using the standard protocol during bolus administration of intravenous contrast. Multiplanar CT image reconstructions  and MIPs were obtained to evaluate the vascular anatomy. Carotid stenosis measurements (when applicable) are obtained utilizing NASCET criteria, using the distal internal carotid diameter as the denominator. RADIATION DOSE REDUCTION: This exam was performed according to the departmental dose-optimization program which includes automated exposure control, adjustment of the mA and/or kV according to patient size and/or use of iterative reconstruction technique. CONTRAST:  75mL OMNIPAQUE  IOHEXOL  350 MG/ML SOLN COMPARISON:  Prior exams from 05/15/2024. FINDINGS: CT HEAD FINDINGS Brain: Age-related cerebral atrophy with mild chronic small vessel ischemic disease. Previously identified small right cerebellar infarct is grossly similar. Few small remote occipital infarcts noted. No acute intracranial hemorrhage. No acute large vessel territory infarct. No mass lesion or midline shift. Ventricular prominence related global parenchymal volume loss of hydrocephalus. Slight asymmetric prominence of the extra-axial space overlying the left cerebral convexity but with no identifiable collection. Vascular: No abnormal hyperdense vessel. Calcified atherosclerosis present at skull base. Skull: Scalp soft tissues and calvarium demonstrate no new finding. Sinuses/Orbits: Globes orbital soft tissues within normal limits. Paranasal sinuses and mastoid air cells remain largely clear. Other: None. Review of the MIP images confirms the above findings CTA NECK FINDINGS Aortic arch: Stent endograft partially visualized within the distal aortic arch. Arch itself is somewhat ectatic measuring up to 3.3 cm. No stenosis about the origin the great vessels. Right carotid system: Right common and internal carotid arteries are diffusely tortuous and ectatic. No dissection. Mild atheromatous change about the right  carotid bulb without stenosis. Previously question abnormality on prior MRA was consistent with artifact, with stenosis seen about the  proximal right carotid artery system. Left carotid system: Left common and internal carotid arteries are tortuous and ectatic. No dissection. Mild atheromatous change about the left carotid bulb without stenosis. Vertebral arteries: Both vertebral arteries arise from subclavian arteries. Left vertebral artery dominant. Vertebral arteries are patent without stenosis or dissection. Skeleton: No worrisome osseous lesions. Other neck: No other acute finding. Upper chest: No other acute finding. Review of the MIP images confirms the above findings CTA HEAD FINDINGS Anterior circulation: Mild atheromatous change about the carotid siphons without stenosis. A1 segments, anterior communicating artery complex common anterior cerebral arteries widely patent. No M1 stenosis or occlusion. Distal MCA branches perfused and symmetric. Posterior circulation: Both V4 segments patent without stenosis. Right PICA patent. Left PICA origin not seen. Basilar patent without stenosis. Superior cerebral arteries patent bilaterally. Both PCAs primarily supplied via the basilar well perfused or distal aspects. Venous sinuses: Patent allowing for timing the contrast bolus. Anatomic variants: As above.  No aneurysm. Review of the MIP images confirms the above findings IMPRESSION: CT HEAD: 1. Grossly stable appearance of previously identified small acute ischemic nonhemorrhagic right cerebellar infarct. 2. Age-related cerebral atrophy with mild chronic small vessel ischemic disease, with a few small remote occipital infarcts CTA HEAD AND NECK: 1. Negative CTA for large vessel occlusion or other emergent finding. 2. Mild atheromatous change about the carotid bifurcations and carotid siphons without hemodynamically significant stenosis. 3. Diffuse tortuosity and ectasia of the major arterial vasculature of the head and neck, suggesting chronic underlying hypertension. 4. Previously noted attenuation of the proximal right CCA on prior MRA was  consistent with artifact. No stenosis in this region. Electronically Signed   By: Morene Hoard M.D.   On: 05/17/2024 03:37   ECHOCARDIOGRAM COMPLETE Result Date: 05/16/2024    ECHOCARDIOGRAM REPORT   Patient Name:   [name] [name] Date of Exam: 05/16/2024 Medical Rec #:  [id number]      Height:       71.0 in Accession #:    [id number]     Weight:       189.2 lb Date of Birth:  [birth year]      BSA:          2.059 m Patient Age:    78 years       BP:           146/80 mmHg Patient Gender: M              HR:           70 bpm. Exam Location:  Inpatient Procedure: 2D Echo, Cardiac Doppler, Color Doppler and Strain Analysis (Both            Spectral and Color Flow Doppler were utilized during procedure). Indications:    Stroke l63.9  History:        Patient has prior history of Echocardiogram examinations, most                 recent 03/02/2024. Risk Factors:Hypertension, Diabetes and                 Dyslipidemia. Hx of CKD.  Sonographer:    Aida Pizza RCS Referring Phys: [phone] MCNEILL P KIRKPATRICK  Sonographer Comments: Global longitudinal strain was attempted. IMPRESSIONS  1. Left ventricular ejection fraction, by estimation, is 60 to 65%. The left ventricle has normal function. The left ventricle  has no regional wall motion abnormalities. There is moderate left ventricular hypertrophy. Left ventricular diastolic parameters were normal.  2. Right ventricular systolic function is normal. The right ventricular size is normal.  3. A small pericardial effusion is present.  4. The mitral valve is normal in structure. Mild mitral valve regurgitation. No evidence of mitral stenosis.  5. The aortic valve is tricuspid. Aortic valve regurgitation is mild. No aortic stenosis is present.  6. Aortic dilatation noted. 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  7. The inferior vena cava is normal in size with greater than 50% respiratory variability, suggesting right atrial pressure of 3 mmHg. FINDINGS   Left Ventricle: Left ventricular ejection fraction, by estimation, is 60 to 65%. The left ventricle has normal function. The left ventricle has no regional wall motion abnormalities. Strain was performed and the global longitudinal strain is indeterminate. The left ventricular internal cavity size was normal in size. There is moderate left ventricular hypertrophy. Left ventricular diastolic parameters were normal. Right Ventricle: The right ventricular size is normal. Right ventricular systolic function is normal. Left Atrium: Left atrial size was normal in size. Right Atrium: Right atrial size was normal in size. Pericardium: A small pericardial effusion is present. Mitral Valve: The mitral valve is normal in structure. Mild mitral valve regurgitation. No evidence of mitral valve stenosis. Tricuspid Valve: The tricuspid valve is normal in structure. Tricuspid valve regurgitation is trivial. No evidence of tricuspid stenosis. Aortic Valve: The aortic valve is tricuspid. Aortic valve regurgitation is mild. No aortic stenosis is present. Aortic valve mean gradient measures 6.0 mmHg. Aortic valve peak gradient measures 10.5 mmHg. Aortic valve area, by VTI measures 2.44 cm. Pulmonic Valve: The pulmonic valve was normal in structure. Pulmonic valve regurgitation is trivial. No evidence of pulmonic stenosis. Aorta: Aortic dilatation noted. 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 Venous: The inferior vena cava is normal in size with greater than 50% respiratory variability, suggesting right atrial pressure of 3 mmHg. IAS/Shunts: No atrial level shunt detected by color flow Doppler.  LEFT VENTRICLE PLAX 2D LVIDd:         5.00 cm   Diastology LVIDs:         3.00 cm   LV e' medial:    9.36 cm/s LV PW:         1.80 cm   LV E/e' medial:  6.0 LV IVS:        1.40 cm   LV e' lateral:   8.92 cm/s LVOT diam:     2.10 cm   LV E/e' lateral: 6.3 LV SV:         91 LV SV Index:   44 LVOT Area:     3.46 cm  RIGHT  VENTRICLE RV S prime:     21.10 cm/s TAPSE (M-mode): 2.1 cm LEFT ATRIUM             Index LA diam:        3.10 cm 1.51 cm/m LA Vol (A2C):   75.3 ml 36.57 ml/m LA Vol (A4C):   68.7 ml 33.36 ml/m LA Biplane Vol: 71.4 ml 34.67 ml/m  AORTIC VALVE AV Area (Vmax):    2.91 cm AV Area (Vmean):   2.47 cm AV Area (VTI):     2.44 cm AV Vmax:           162.00 cm/s AV Vmean:          110.000 cm/s  AV VTI:            0.372 m AV Peak Grad:      10.5 mmHg AV Mean Grad:      6.0 mmHg LVOT Vmax:         136.00 cm/s LVOT Vmean:        78.567 cm/s LVOT VTI:          0.262 m LVOT/AV VTI ratio: 0.71  AORTA Ao Root diam: 4.40 cm MITRAL VALVE MV Area (PHT): 2.39 cm    SHUNTS MV Decel Time: 317 msec    Systemic VTI:  0.26 m MV E velocity: 56.60 cm/s  Systemic Diam: 2.10 cm MV A velocity: 85.30 cm/s MV E/A ratio:  0.66 Redell Shallow MD Electronically signed by Redell Shallow MD Signature Date/Time: 05/16/2024/1:07:40 PM    Final    Recent Labs    05/17/24 1908 05/18/24 0617  WBC 10.1 9.8  HGB 9.2* 8.8*  HCT 28.7* 26.8*  PLT 336 326   Recent Labs    05/17/24 1908 05/18/24 0617  NA 139 138  K 3.9 4.0  CL 106 109  CO2 21* 20*  GLUCOSE 120* 109*  BUN 16 18  CREATININE 1.25* 1.38*  CALCIUM  8.6* 8.5*    Intake/Output Summary (Last 24 hours) at 05/18/2024 0829 Last data filed at 05/17/2024 2300 Gross per 24 hour  Intake --  Output 100 ml  Net -100 ml        Physical Exam: Vital Signs Blood pressure (!) 140/82, pulse 65, temperature 98.3 F (36.8 C), temperature source Oral, resp. rate 19, height 5' 11 (1.803 m), weight 85 kg, SpO2 100%.    Assessment/Plan: 1. Functional deficits which require 3+ hours per day of interdisciplinary therapy in a comprehensive inpatient rehab setting. Physiatrist is providing close team supervision and 24 hour management of active medical problems listed below. Physiatrist and rehab team continue to assess barriers to discharge/monitor patient progress toward  functional and medical goals  Care Tool:  Bathing              Bathing assist       Upper Body Dressing/Undressing Upper body dressing        Upper body assist      Lower Body Dressing/Undressing Lower body dressing            Lower body assist       Toileting Toileting    Toileting assist       Transfers Chair/bed transfer  Transfers assist           Locomotion Ambulation   Ambulation assist              Walk 10 feet activity   Assist           Walk 50 feet activity   Assist           Walk 150 feet activity   Assist           Walk 10 feet on uneven surface  activity   Assist           Wheelchair     Assist               Wheelchair 50 feet with 2 turns activity    Assist            Wheelchair 150 feet activity     Assist          Blood pressure (!) 140/82, pulse 65, temperature  98.3 F (36.8 C), temperature source Oral, resp. rate 19, height 5' 11 (1.803 m), weight 85 kg, SpO2 100%.  Medical Problem List and Plan: 1. 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  Antithrombotics: -DVT/anticoagulation:  Pharmaceutical: Lovenox , age indeterminate asymptomatic Left Fem DVT, per Dr Sheree VVS, cont current meds, no activity restriction              -antiplatelet therapy: continue ASA   3. Pain Management: continue Oxycodone  prn for severe and tylenol  prn for mild pain   4. Mood/Behavior/Sleep: LCSW to follow for evaluation and support.              -antipsychotic agents: N/A   5. Neuropsych/cognition: This patient is not capable of making decisions on his own behalf.   6. Skin/Wound Care: Routine pressure relief measures. Monitor incision for healing   7. Fluids/Electrolytes/Nutrition:  Monitor I/O. Check CMET in am             --offer supplement  prn   8. T2DM: Monitor BS ac/hs and use SSI for elevated BS             --Was on metform 500 mg bid--held due to dye study, consider resuming once kidney function improves   9.HTN: Monitor BP TID--off home meds at this time. BP reviewed and is starting to increase, will start amlodipine  2.5mg  daily Vitals:   05/17/24 2123 05/18/24 0437  BP: (!) 155/87 (!) 140/82  Pulse: 73 65  Resp: 17 19  Temp: 98.6 F (37 C) 98.3 F (36.8 C)  SpO2: 99% 100%   Monitor effect for 1-2 more days and adjust dose if needed    10. AKI: BUN/SCr 11/1.21 at admission with rise to 19/1.54 post procedure                 Latest Ref Rng & Units 05/18/2024    6:17 AM 05/17/2024    7:08 PM 05/16/2024    5:42 PM  BMP  Glucose 70 - 99 mg/dL 890  879    BUN 8 - 23 mg/dL 18  16    Creatinine 9.38 - 1.24 mg/dL 8.61  8.74  8.69   Sodium 135 - 145 mmol/L 138  139    Potassium 3.5 - 5.1 mmol/L 4.0  3.9    Chloride 98 - 111 mmol/L 109  106    CO2 22 - 32 mmol/L 20  21    Calcium  8.9 - 10.3 mg/dL 8.5  8.6        11. Thrombocytopenia: Normal at admission--153 but down to 93 and recovering --Likely reactive but will monitor for signs of bleeding.    12.  ABLA: Recheck stable no hematuria, melena noted, cont to monitor at least weekly     Latest Ref Rng & Units 05/18/2024    6:17 AM 05/17/2024    7:08 PM 05/11/2024    2:43 PM  CBC  WBC 4.0 - 10.5 K/uL 9.8  10.1  12.4   Hemoglobin 13.0 - 17.0 g/dL 8.8  9.2  9.2   Hematocrit 39.0 - 52.0 % 26.8  28.7  28.0   Platelets 150 - 400 K/uL 326  336  106  13. Leucocytosis: resolved    14.  H/o dementia: continue Namenda .    LOS: 1 days A FACE TO FACE EVALUATION WAS PERFORMED  Prentice FORBES Compton 05/18/2024, 8:29 AM

## 2024-05-19 ENCOUNTER — Inpatient Hospital Stay (HOSPITAL_COMMUNITY)

## 2024-05-19 ENCOUNTER — Other Ambulatory Visit: Payer: Self-pay

## 2024-05-19 DIAGNOSIS — I639 Cerebral infarction, unspecified: Secondary | ICD-10-CM | POA: Diagnosis not present

## 2024-05-19 DIAGNOSIS — R29898 Other symptoms and signs involving the musculoskeletal system: Secondary | ICD-10-CM | POA: Diagnosis not present

## 2024-05-19 DIAGNOSIS — I7103 Dissection of thoracoabdominal aorta: Secondary | ICD-10-CM

## 2024-05-19 DIAGNOSIS — I71019 Dissection of thoracic aorta, unspecified: Secondary | ICD-10-CM | POA: Diagnosis not present

## 2024-05-19 LAB — GLUCOSE, CAPILLARY
Glucose-Capillary: 106 mg/dL — ABNORMAL HIGH (ref 70–99)
Glucose-Capillary: 139 mg/dL — ABNORMAL HIGH (ref 70–99)
Glucose-Capillary: 116 mg/dL — ABNORMAL HIGH (ref 70–99)
Glucose-Capillary: 134 mg/dL — ABNORMAL HIGH (ref 70–99)

## 2024-05-19 MED ORDER — GADOBUTROL 1 MMOL/ML IV SOLN
8.0000 mL | Freq: Once | INTRAVENOUS | Status: AC | PRN
  Administered 2024-05-19: 8 mL via INTRAVENOUS

## 2024-05-19 NOTE — Progress Notes (Signed)
 Physical Therapy Session Note  Patient Details  Name: [name] [name] MRN: [id number] Date of Birth: [birth year]  Today's Date: 05/19/2024 PT Individual Time: 1117-1205 PT Individual Time Calculation (min): 48 min   Short Term Goals: Week 1:  PT Short Term Goal 1 (Week 1): Pt will be able to perform bed mobility with mod A PT Short Term Goal 2 (Week 1): Pt will be able to perform transfers bed <> w/c with mod A PT Short Term Goal 3 (Week 1): Pt will initiate gait PT Short Term Goal 4 (Week 1): Pt will demonstrate dynamic sitting balance with min A  Skilled Therapeutic Interventions/Progress Updates:  Patient supine in bed on entrance to room. Patient alert and agreeable to PT session.   Patient with no pain complaint at start of session. Does relate intermittent pain with surgical incision points in groin and abdomen. With cough, pt expresses pain in abdomen. Educated to provide self with abdominal pressure in area of pain prior to cough, sneeze, etc. In order to decrease intrabdominal pressure during reflexive exhale.   Therapeutic Activity: Bed Mobility: Pt performed supine > sit to L side with mod A. VC/ tc required for technique and sequencing. Transfers: Pt performed sit<>stand from EOB with Mod/ MaxA and stand pivot transfer to w/c with Mod/MaxA. Provided vc/ tc for technique including feet positioning, forward lean for anterior weight shift. Pt with difficulty in maintaining forward weight shift and has continuous bias posteiorly.  Gait Training:  Pt ambulated 10' x2 using RW with ModA for balance. Demonstrated flexed posture, pain/ fatigue with time in stance. Provided vc/ tc throughout for upright posture, focus to maintain LLE hold during L stance phase and lift of LLE for swing phase. With upright posture, pt's balance and step progression improves. Pt unable to maintain posture d/t related pain. Does relate disappointment with performance.   Neuromuscular Re-ed: NMR facilitated  during session with focus on standing balance. Pt guided in sit<>stand training and ability to maintain posture in midline. Pirror used for feedback in [employer] to improve self-correction. Guided in stance for to to per bout d/t pain. Focus on upright posture and maintaining L hand hold to RW, post/ ant weight shifts, and lateral weight shifts with attempts to pick up feet for small march. Requires heavy tc and some vc for upright posture and pointed out improved balance and LLE mobility with upright posture. NMR performed for improvements in motor control and coordination, balance, sequencing, judgement, and self confidence/ efficacy in performing all aspects of mobility at highest level of independence.   Patient seated upright in w/c at end of session with brakes locked, belt alarm set, and all needs within reach.  Therapy Documentation Precautions:  Precautions Precautions: Fall Recall of Precautions/Restrictions: Impaired Precaution/Restrictions Comments: incision near L clavicle (axillary artery) and bil groins; L weakness Restrictions Weight Bearing Restrictions Per Provider Order: No  Pain: Pain related during session with time in stance. Addressed with rest/ repositioning.   Therapy/Group: Individual Therapy  [name] PT, DPT, CSRS 05/19/2024, 1:55 PM

## 2024-05-19 NOTE — Progress Notes (Signed)
 Physical Therapy Session Note  Patient Details  Name: [name] [name] MRN: [id number] Date of Birth: [birth year]  Today's Date: 05/19/2024 PT Individual Time: 1502-1600 PT Individual Time Calculation (min): 58 min   Short Term Goals: Week 1:  PT Short Term Goal 1 (Week 1): Pt will be able to perform bed mobility with mod A PT Short Term Goal 2 (Week 1): Pt will be able to perform transfers bed <> w/c with mod A PT Short Term Goal 3 (Week 1): Pt will initiate gait PT Short Term Goal 4 (Week 1): Pt will demonstrate dynamic sitting balance with min A  Skilled Therapeutic Interventions/Progress Updates:  Patient supine in bed on entrance to room. Patient alert and agreeable to PT session.   Patient with no pain complaint at start of session but does relate fatigue but desire to participate.   Therapeutic Activity: Bed Mobility: Pt relates that he uses R side of bed to enter/ exit at home. Bed setup for supine > sit to his R side. Pt demos extreme difficulty with roll to sidelying and is unable to bring hips and LW to sidelying but can bring L hand over to bedrail on R side. Requires MaxA to roll  as well as to maintain. Educated pt to then push with R elbow and L hand to bed in order to push to L to initiate sidelying to sit. Requires multiple attempts and ultimately TotA to reach. Max A to position BLE with feet supported on floor and Bil palms on EOB. Requires TotA to don shoes with pt continually melting posteriorly and unable to self-correct despite vc/ tc and awareness of LOB.  Transfers: Pt performed squat pivot transfer requiring MaxA for setup and verbal instructions for sequencing. Is able to initiate and complete with mod/ MaxA. Correects position in w/c with supervision.   Sit<>stand transfers during session performed with MaxA for power up after vc for split UE positioning but pt bringing BUE to RW. Continues to demo posterior bias with rise to stand and requires vc as well as physical  promotion of forward weight shift in order to perform to stance to RW placed forward to pt.   Gait Training:  Pt ambulated 12 ft using Rw with Min/ ModA for balance, maintaining forward positioning into BUE on RW, and cued for increased step height/ length with LLE during swing phase and hold in LE extension during stance phase. Continues to follow cues for upright posture following step with LLE but flexes forward with initiation of L swing phase. Pain related?   Neuromuscular Re-ed: NMR facilitated during session with focus on standing balance. Pt guided in standing retrieval of rings hanging from Squigz placed on full length mirror in front of pt. With forward walker positioning and forward reach, pt is able to complete while maintaining balance for up to 2 min and MinA for balance. Sit<>stand requires assist as explained above throughout task.   NMR performed for improvements in motor control and coordination, balance, sequencing, judgement, and self confidence/ efficacy in performing all aspects of mobility at highest level of independence.   Pt returned to supine d/t request from pain. Entered bed to L side and is able to stand pivot with Mod/ maxA for balance and hold to bed rail for stability with LUE and grasp to therapist's arm. Is abel to step pivot with maxA for balance d/t posterior bias.   Patient supine in bed at end of session with brakes locked, bed alarm set, and all  needs within reach.   Therapy Documentation Precautions:  Precautions Precautions: Fall Recall of Precautions/Restrictions: Impaired Precaution/Restrictions Comments: incision near L clavicle (axillary artery) and bil groins; L weakness Restrictions Weight Bearing Restrictions Per Provider Order: No  Pain: Unrated pain throughout session with mobility. Addressed with rest/ repositioning.   Therapy/Group: Individual Therapy  [name] PT, DPT, CSRS 05/19/2024, 1:56 PM

## 2024-05-19 NOTE — Progress Notes (Signed)
 Occupational Therapy Session Note  Patient Details  Name: [name] [name] MRN: [id number] Date of Birth: Sep 12, 1945  Today's Date: 05/19/2024 OT Individual Time: 0801-0900 OT Individual Time Calculation (min): 59 min    Short Term Goals: Week 1:  OT Short Term Goal 1 (Week 1): Patient to perform toilet transfer with Mod assist of one OT Short Term Goal 2 (Week 1): Patient to perform UB dressing with Min assist OT Short Term Goal 3 (Week 1): Patient to perform grooming with set up seated at the sink  Skilled Therapeutic Interventions/Progress Updates:  Skilled OT session completed to address ADL retraining and static sitting balance. Pt received supine in bed, agreeable to participate in therapy. Pt reports pain with activity at insicion sight, OT provided pain intervention with rest breaks and repositioning.   Pt requested to brush teeth post breakfast, supine>EOB Max A to move BLE and position trunk EOB. At EOB, pt displays difficulty maintaining static sitting balance with posterior lean. VC for trunk flexion, and BLE support, Max A to remain seated EOB. EOB>WC Max A  to reach standing with STS lift. Pt completed oral care with supervision, VC for sequencing. Don/doffs UB clothing with Mod A, VC for sequencing and task completion. Throughout session, pt would discontinue task and reports he did not know next step. Don/doffs LB clothing with Max A, A to weave BLE in clothing, pt able to pull clothing up to knees, with STS lift, OT pulls clothing over bottom. Pt returned to [facility] seated with chair alarm on and all needs within reach.  Therapy Documentation Precautions:  Precautions Precautions: Fall Recall of Precautions/Restrictions: Impaired Precaution/Restrictions Comments: incision near L clavicle (axillary artery) and bil groins; L weakness Restrictions Weight Bearing Restrictions Per Provider Order: No   Therapy/Group: Individual Therapy  Sina Lucchesi Woods-Chance, MS, OTR/L 05/19/2024,  7:56 AM

## 2024-05-19 NOTE — Progress Notes (Addendum)
 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 No intravenous contrast was administered.   COMPARISON:  MRI of the lumbar spine March 06, 2018 and January 23, 2013   FINDINGS: Segmentation: A transitional lumbosacral vertebra is assumed to represent the L5 level. Careful correlation with this numbering strategy prior to any procedural intervention would be recommended.   Alignment:  Physiologic.   Vertebrae: Interbody fusion and posterior left transpedicular fixation is noted at L3-4 with laminectomy at L4-5. Degenerative endplate changes are noted at L3-4. Marrow signal characteristics are otherwise maintained.   Conus medullaris and cauda equina: Conus extends to the T12-L1 level. Conus appear normal. Redundancy of nerve roots at the level of L1 is related to L2-3 spinal canal stenosis.   Paraspinal and other soft tissues: Well-defined oval lesion located to the left of midline in the prevertebral soft tissue at the level of L5-S1 is seen in the sagittal views. The lesion as predominantly hypointense signal on T1 and heterogeneous signal on T2. When compared to prior study, the lesion appears with similar size (3.7 x 2.7 cm) when compared to study performed 2019 but enlarged when compared to MRI performed 2014. T2 signal appear changed when compared to prior study.   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   Right renal cysts.   Disc levels:   T12-L1: No spinal canal or neural foraminal stenosis.   L1-2: No spinal canal or neural foraminal stenosis.   L2-3: Disc bulge with superimposed  tiny right central disc protrusion, facet degenerative changes and ligamentum flavum redundancy resulting in moderate to severe spinal canal stenosis, moderate right and severe left neural foraminal stenosis.   L3-4: Laminectomy with facet fusion. No spinal canal or neural foraminal stenosis.   L4-5: Disc bulge with tiny superimposed central disc protrusion and mild facet degenerative changes result in severe bilateral neural foraminal narrowing. Laminectomy is also noted with no spinal canal stenosis.   L5-S1: No spinal canal or neural foraminal stenosis.   Compared to prior study, there has been significant progression of the degree of spinal canal stenosis at L2-3.   IMPRESSION: 1. 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 This has progressed when compared to prior MRI. 2. Well-defined oval lesion the prevertebral soft tissues to the left of midline at the L5-S1 level, only seen on the sagittal images. This appear with similar size although slightly different T2 signal when compared to study performed 2019 but enlarged when compared to study performed in 2014. 3. 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 Recommend followup by ultrasound in 2 years. This recommendation follows ACR consensus guidelines: White Paper of the ACR Incidental Findings Committee II on Vascular Findings. J Am Coll Radiol 2013; 10:  Electronically Signed   By: [name] M.D.   On: 10/22/2019 14:06 VAS US  LOWER EXTREMITY VENOUS (DVT) Result Date: 05/18/2024  Lower Venous DVT Study Patient Name:  [name] [name]  Date of Exam:   05/17/2024 Medical Rec #: [id number]       Accession #:    [id number] Date of Birth: 07-29-1946       Patient Gender: M Patient Age:   78 years Exam Location:  [facility]  Procedure:      VAS US  LOWER EXTREMITY VENOUS (DVT) Referring Phys: ARY XU --------------------------------------------------------------------------------  Indications: Stroke (embolic shower).  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 Limitations: Poor ultrasound/tissue interface and edema. Comparison Study: Prior negative bilateral LEV done 10/09/19 Performing Technologist: Alberta Lis RVS  Examination Guidelines: A complete evaluation includes B-mode imaging, spectral Doppler, color Doppler, and power Doppler as needed of all accessible portions of each vessel. Bilateral testing is considered an integral part of a complete examination. Limited examinations for reoccurring indications may be performed as noted. The reflux portion of the exam is performed with the patient in reverse Trendelenburg.  +---------+---------------+---------+-----------+----------+-------------------+ RIGHT    CompressibilityPhasicitySpontaneityPropertiesThrombus Aging      +---------+---------------+---------+-----------+----------+-------------------+ CFV      Full           Yes      No                                       +---------+---------------+---------+-----------+----------+-------------------+ SFJ      Full                                                             +---------+---------------+---------+-----------+----------+-------------------+ FV Prox  Full           Yes      No                                       +---------+---------------+---------+-----------+----------+-------------------+ FV Mid   Full                                                             +---------+---------------+---------+-----------+----------+-------------------+ FV DistalFull                                                             +---------+---------------+---------+-----------+----------+-------------------+ PFV      Full                                                              +---------+---------------+---------+-----------+----------+-------------------+ POP  Yes      No                   patent by color and                                                       Doppler             +---------+---------------+---------+-----------+----------+-------------------+ PTV      Full                                                             +---------+---------------+---------+-----------+----------+-------------------+ PERO     Full                                                             +---------+---------------+---------+-----------+----------+-------------------+   +---------+---------------+---------+-----------+----------+-----------------+ LEFT     CompressibilityPhasicitySpontaneityPropertiesThrombus Aging    +---------+---------------+---------+-----------+----------+-----------------+ CFV      Partial        Yes      Yes                  Age Indeterminate +---------+---------------+---------+-----------+----------+-----------------+ FV Prox  Full           Yes      Yes                                    +---------+---------------+---------+-----------+----------+-----------------+ FV Mid   Full           Yes      Yes                                    +---------+---------------+---------+-----------+----------+-----------------+ FV DistalFull           Yes      Yes                                    +---------+---------------+---------+-----------+----------+-----------------+ PFV      Full           Yes      Yes                                    +---------+---------------+---------+-----------+----------+-----------------+ POP                     Yes      Yes                  Age Indeterminate +---------+---------------+---------+-----------+----------+-----------------+ PTV      None  Age Indeterminate  +---------+---------------+---------+-----------+----------+-----------------+ PERO     None                                         Age Indeterminate +---------+---------------+---------+-----------+----------+-----------------+ EIV                     Yes      Yes                  patent            +---------+---------------+---------+-----------+----------+-----------------+     Summary: RIGHT: - There is no evidence of deep vein thrombosis in the lower extremity. However, portions of this examination were limited- see technologist comments above.  LEFT: - Findings consistent with partially occlusive, age indeterminate deep vein thrombosis involving the left common femoral and left popliteal veins, and age indeterminate, occlusive deep vein thrombosis involving the left posterior tibial and left peroneal  veins. The External iliac vein appears patent.   *See table(s) above for measurements and observations. Electronically signed by Gaile New MD on 05/18/2024 at 2:40:58 PM.    Final    Recent Labs    05/17/24 1908 05/18/24 0617  WBC 10.1 9.8  HGB 9.2* 8.8*  HCT 28.7* 26.8*  PLT 336 326   Recent Labs    05/17/24 1908 05/18/24 0617  NA 139 138  K 3.9 4.0  CL 106 109  CO2 21* 20*  GLUCOSE 120* 109*  BUN 16 18  CREATININE 1.25* 1.38*  CALCIUM  8.6* 8.5*    Intake/Output Summary (Last 24 hours) at 05/19/2024 0746 Last data filed at 05/18/2024 1839 Gross per 24 hour  Intake 416 ml  Output 250 ml  Net 166 ml        Physical Exam: Vital Signs Blood pressure (!) 152/68, pulse 66, temperature 98.5 F (36.9 C), resp. rate 18, height 5' 11 (1.803 m), weight 85 kg, SpO2 96%.   General: No acute distress Mood and affect are appropriate Heart: Regular rate and rhythm no rubs murmurs or extra sounds Lungs: Clear to auscultation, breathing unlabored, no rales or wheezes Abdomen: Positive bowel sounds, soft nontender to palpation, nondistended Extremities: No clubbing,  cyanosis, or edema Skin: No evidence of breakdown, no evidence of rash Neurologic: Cranial nerves II through XII intact, motor strength is 5/5 in bilateral deltoid, bicep, tricep, grip,3-/5 R RIght and 2-/5 left  hip flexor, 4- Right and 3- left knee extensors, 5/5 RIght and 4/5 left ankle dorsiflexor and plantar flexor Sensory exam normal sensation to light touch and proprioception in bilateral upper , LT normal bilateral L2,3,4,  Musculoskeletal: Full range of motion in all 4 extremities. No joint swelling Oriented to Griffing and place but not time, delayed responses   Assessment/Plan: 1. Functional deficits which require 3+ hours per day of interdisciplinary therapy in a comprehensive inpatient rehab setting. Physiatrist is providing close team supervision and 24 hour management of active medical problems listed below. Physiatrist and rehab team continue to assess barriers to discharge/monitor patient progress toward functional and medical goals  Care Tool:  Bathing    Body parts bathed by patient: Right arm, Left arm, Chest, Front perineal area, Abdomen, Right upper leg, Left upper leg, Face   Body parts bathed by helper: Buttocks, Right lower leg, Left lower leg     Bathing assist Assist Level: Moderate Assistance - Patient 50 - 74%  Upper Body Dressing/Undressing Upper body dressing   What is the patient wearing?: Pull over shirt    Upper body assist Assist Level: Moderate Assistance - Patient 50 - 74%    Lower Body Dressing/Undressing Lower body dressing      What is the patient wearing?: Pants, Incontinence brief     Lower body assist Assist for lower body dressing: Dependent - Patient 0%     Toileting Toileting    Toileting assist Assist for toileting: Dependent - Patient 0%     Transfers Chair/bed transfer  Transfers assist     Chair/bed transfer assist level: Dependent - mechanical lift     Locomotion Ambulation   Ambulation assist    Ambulation activity did not occur: Safety/medical concerns          Walk 10 feet activity   Assist  Walk 10 feet activity did not occur: Safety/medical concerns        Walk 50 feet activity   Assist Walk 50 feet with 2 turns activity did not occur: Safety/medical concerns         Walk 150 feet activity   Assist Walk 150 feet activity did not occur: Safety/medical concerns         Walk 10 feet on uneven surface  activity   Assist Walk 10 feet on uneven surfaces activity did not occur: Safety/medical concerns         Wheelchair     Assist Is the patient using a wheelchair?: Yes Type of Wheelchair: Manual      Max wheelchair distance: 150    Wheelchair 50 feet with 2 turns activity    Assist        Assist Level: Contact Guard/Touching assist   Wheelchair 150 feet activity     Assist      Assist Level: Contact Guard/Touching assist   Blood pressure (!) 152/68, pulse 66, temperature 98.5 F (36.9 C), resp. rate 18, height 5' 11 (1.803 m), weight 85 kg, SpO2 96%.  Medical Problem List and Plan: 1. 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  Antithrombotics: -DVT/anticoagulation:  Pharmaceutical: Lovenox , age indeterminate asymptomatic Left Fem DVT, per Dr Sheree VVS, cont current meds, no activity restriction              -antiplatelet therapy: continue ASA   3. Pain Management: continue Oxycodone  prn for severe and tylenol  prn for mild pain   4. Mood/Behavior/Sleep: LCSW to follow for evaluation and support.              -antipsychotic agents: N/A   5. Neuropsych/cognition: This patient is not capable of making decisions on his own behalf. 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 Skin/Wound Care: Routine pressure  relief measures. Monitor incision for healing   7. Fluids/Electrolytes/Nutrition:  Monitor I/O. Check CMET in am             --offer supplement prn   8. T2DM: Monitor BS ac/hs and use SSI for elevated BS             --Was on metform 500 mg bid--held due to dye study, consider resuming once  kidney function improves   9.HTN: Monitor BP TID--off home meds at this time. BP reviewed and is starting to increase, will start amlodipine  2.5mg  daily Vitals:   05/18/24 1925 05/19/24 0415  BP: (!) 161/86 (!) 152/68  Pulse: 74 66  Resp: 17 18  Temp: 99.4 F (37.4 C) 98.5 F (36.9 C)  SpO2: 100% 96%   Monitor effect for 1-2 more days and adjust dose if needed    10. AKI: BUN/SCr 11/1.21 at admission with rise to 19/1.54 post procedure                 Latest Ref Rng & Units 05/18/2024    6:17 AM 05/17/2024    7:08 PM 05/16/2024    5:42 PM  BMP  Glucose 70 - 99 mg/dL 890  879    BUN 8 - 23 mg/dL 18  16    Creatinine 9.38 - 1.24 mg/dL 8.61  8.74  8.69   Sodium 135 - 145 mmol/L 138  139    Potassium 3.5 - 5.1 mmol/L 4.0  3.9    Chloride 98 - 111 mmol/L 109  106    CO2 22 - 32 mmol/L 20  21    Calcium  8.9 - 10.3 mg/dL 8.5  8.6        11. Thrombocytopenia: Normal at admission--153 but down to 93 and recovering --Likely reactive but will monitor for signs of bleeding.    12.  ABLA: Recheck stable no hematuria, melena noted, cont to monitor at least weekly     Latest Ref Rng & Units 05/18/2024    6:17 AM 05/17/2024    7:08 PM 05/11/2024    2:43 PM  CBC  WBC 4.0 - 10.5 K/uL 9.8  10.1  12.4   Hemoglobin 13.0 - 17.0 g/dL 8.8  9.2  9.2   Hematocrit 39.0 - 52.0 % 26.8  28.7  28.0   Platelets 150 - 400 K/uL 326  336  106       13. Leucocytosis: resolved    14.  H/o dementia: continue Namenda .   15.  Bilateral LE weakness proximally and Left distal LE weakness with hx Lumbar stenosis , suspect progression since prior Lumbar MRI in 2021, will order with contrast since pt has had L3-4 fusion per  Dr Beuford in the past  Pt had repeat MRI lumbar showing intrathecal T1 signal at L5-S1 as well as prevertebral mass at that level would like NS input  LOS: 2 days A FACE TO FACE EVALUATION WAS PERFORMED  [name] [name] 05/19/2024, 7:46 AM

## 2024-05-19 NOTE — Progress Notes (Signed)
 Patient ID: [name] [name], male   DOB: 1945/09/26, 78 y.o.   MRN: [id number] Have left a message for wife to introduce myself and discuss her concerns and questions. Await her return call

## 2024-05-19 NOTE — Progress Notes (Signed)
 Speech Language Pathology Daily Session Note  Patient Details  Name: [name] [name] MRN: [id number] Date of Birth: September 09, 1945  Today's Date: 05/19/2024 SLP Individual Time: 1400-1500 SLP Individual Time Calculation (min): 60 min  Short Term Goals: Week 1: SLP Short Term Goal 1 (Week 1): Pt will recall recent/relevant info, utilizing memory aids as needed given modA SLP Short Term Goal 2 (Week 1): Pt will sustain attention for ~10 mins w/ modA SLP Short Term Goal 3 (Week 1): Pt will complete verbal organization/thought formulation tasks w/ minA SLP Short Term Goal 4 (Week 1): Pt will solve functional problems w/ modA SLP Short Term Goal 5 (Week 1): Pt will process mildly complex information w/ modA  Skilled Therapeutic Interventions:   Pt greeted at bedside for tx targeting cognition. He remains flat, but was very pleasant and cooperative throughout tx tasks. SLP provided pt w/ calendar and he was able to provide current orientation info w/ supervisionA. Also provided education re upcoming team conference and pending d/c date. SLP then facilitated verbal organization task and he benefited from minA cues for word finding and thought formulation. He also benefited from [facility] for problem solving and information processing during phone navigation task. After ~30 min delay, he was able to recall orientation information w/ 100% accuracy. At the end of tx tasks, he was left w/ the bed alarm set and call light within reach. Recommend cont ST per POC.   Pain Pain Assessment Pain Scale: 0-10 Pain Score: 1  Pain Location: Back  Therapy/Group: Individual Therapy  [name] 05/19/2024, 2:48 PM

## 2024-05-19 NOTE — Progress Notes (Signed)
 Patient ID: [name] [name], male   DOB: 1945-11-20, 78 y.o.   MRN: [id number] Met with the patient to review current medical situation, rehab process, team conference and plan of care. Discussed secondary risk management including HTN, HLD, DM (A1C 5.8) on metformin and previous CVA (ASA + Plavix x month). Reviewed HH/CMM diet; patient noted wife manages his medications.  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 Notes soreness with movement and incontinence, although reports he used a urinal at home and aware of need to void.  Continue to follow along to address educational needs to facilitate preparation for discharge. Fredericka Barnie NOVAK

## 2024-05-20 DIAGNOSIS — I639 Cerebral infarction, unspecified: Secondary | ICD-10-CM | POA: Diagnosis not present

## 2024-05-20 DIAGNOSIS — I71019 Dissection of thoracic aorta, unspecified: Secondary | ICD-10-CM | POA: Diagnosis not present

## 2024-05-20 DIAGNOSIS — R29898 Other symptoms and signs involving the musculoskeletal system: Secondary | ICD-10-CM | POA: Diagnosis not present

## 2024-05-20 LAB — GLUCOSE, CAPILLARY
Glucose-Capillary: 102 mg/dL — ABNORMAL HIGH (ref 70–99)
Glucose-Capillary: 123 mg/dL — ABNORMAL HIGH (ref 70–99)
Glucose-Capillary: 131 mg/dL — ABNORMAL HIGH (ref 70–99)
Glucose-Capillary: 135 mg/dL — ABNORMAL HIGH (ref 70–99)

## 2024-05-20 MED ORDER — POLYETHYLENE GLYCOL 3350 17 G PO PACK
34.0000 g | PACK | Freq: Every day | ORAL | Status: DC
  Administered 2024-05-20 – 2024-06-09 (×20): 34 g via ORAL
  Filled 2024-05-20 (×22): qty 2

## 2024-05-20 NOTE — Progress Notes (Signed)
 Speech Language Pathology Daily Session Note  Patient Details  Name: [name] [name] MRN: [id number] Date of Birth: Aug 14, 1946  Today's Date: 05/20/2024 SLP Individual Time: 1400-1502 SLP Individual Time Calculation (min): 62 min  Short Term Goals: Week 1: SLP Short Term Goal 1 (Week 1): Pt will recall recent/relevant info, utilizing memory aids as needed given modA SLP Short Term Goal 2 (Week 1): Pt will sustain attention for ~10 mins w/ modA SLP Short Term Goal 3 (Week 1): Pt will complete verbal organization/thought formulation tasks w/ minA SLP Short Term Goal 4 (Week 1): Pt will solve functional problems w/ modA SLP Short Term Goal 5 (Week 1): Pt will process mildly complex information w/ modA  Skilled Therapeutic Interventions:   Pt greeted at bedside. He was up in his WC upon SLP, agreeable to tx tasks targeting cognition. He was assisted to the ST office via WC. SLP then reviewed d/c date set in conference and put it in his calendar. During verbal organization task, he benefited from minA for thought formulation and reasoning to ID odd one out. He then benefited from [facility] for working memory, Careers adviser, and information processing during money calculation task w/ bills and coins. He also benefited from minA for pattern copy task. He was assisted back to his room, where his wife and SIL were waiting for him. SLP provided education re progress thus far and POC. He was left in his chair w/ the alarm set and call light within reach. Recommend cont ST per POC.   Pain  No pain reported   Therapy/Group: Individual Therapy  [name] 05/20/2024, 2:22 PM

## 2024-05-20 NOTE — Progress Notes (Signed)
 Occupational Therapy Session Note  Patient Details  Name: [name] [name] MRN: [id number] Date of Birth: 1945/10/20  Today's Date: 05/20/2024 OT Individual Time: 9259-9149 OT Individual Time Calculation (min): 70 min    Short Term Goals: Week 1:  OT Short Term Goal 1 (Week 1): Patient to perform toilet transfer with Mod assist of one OT Short Term Goal 2 (Week 1): Patient to perform UB dressing with Min assist OT Short Term Goal 3 (Week 1): Patient to perform grooming with set up seated at the sink  Skilled Therapeutic Interventions/Progress Updates:  Pt greeted asleep in supine, increased time needed to arouse, pt agreeable to OT intervention with max encouragement.    Pt initially declining d/t it being too early.   Transfers/bed mobility/functional mobility:  Pt completed bed mobility with MODA, most assist needed to elevate trunk. Posterior lean noted from EOB with sitting balance improved when bil hands resting on knees vs pushing through EOB. Pt completed lateral scoot to w/c to R side with MODA. MAX cues needed for technique and sequencing as pt wanting to stand vs scoot.    Vision: completed full vision assessment, decreased smoothness in L eye noted during convergence test. Able to track in all quadrants with no issues noted. Additional head turns noted during saccades. Noted double vision in L eye, provided partial occulusion glasses for L eye. Education provided on pulling tape back slowly.    Pt completed visual scanning tasks at [facility] with occlusion glassess donned, mild L inattention noted. Pt completed visual scanning task on BITs with Sequencing letters A-Z,  90%  accuracy, 2 min 55 secs, 9.73 sec reaction time. Slowest on L side.   ADLs:  Grooming: pt completed seated grooming at sink with set- up assist. Pt noted to shut R eye during all grooming tasks d/t reports of double vision.   LB dressing: donned pants and brief from EOB d/t time mgmt,  MAX A overall. Pt able to  bridge minimally to pull pants to waist line.  Footwear: donned shoes from EOB with MAX A, decreased ability to elevate LLE when sitting EOB needing most assist to slide on shoes.   Ended session with pt seated in w/c with all needs within reach and safety  alarm belt activated.                    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

## 2024-05-20 NOTE — IPOC Note (Signed)
 Overall Plan of Care [facility]) Patient Details Name: [name] [name] MRN: [id number] DOB: March 12, 1946  Admitting Diagnosis: Stroke (cerebrum) [facility])  Hospital Problems: Principal Problem:   Stroke (cerebrum) [facility])     Functional Problem List: Nursing Safety, Bladder, Bowel, Endurance, Medication Management, Pain  PT Balance, Endurance, Motor, Pain, Perception, Safety, Skin Integrity  OT Balance, Safety, Cognition, Vision, Endurance, Motor  SLP Cognition  TR         Basic ADL's: OT Eating, Grooming, Bathing, Dressing, Toileting     Advanced  ADL's: OT Simple Meal Preparation     Transfers: PT Bed Mobility, Bed to Chair, Car, Occupational psychologist, Research scientist (life sciences): PT Ambulation, Psychologist, prison and probation services, Stairs     Additional Impairments: OT Fuctional Use of Upper Extremity  SLP Communication, Social Cognition expression Problem Solving, Memory, Awareness, Attention  TR      Anticipated Outcomes Item Anticipated Outcome  Self Feeding Mod I  Swallowing      Basic self-care  CGA to set up with seated tasks  Toileting  CGA   Bathroom Transfers CGA  Bowel/Bladder  manage bowel and bladder w mod I assist  Transfers  CGA for transfers w/c level, min A car transfer  Locomotion  S w/c mobility, min A short distance gait  Communication     Cognition  supervisionA  Pain  Pain < 4 with prns  Safety/Judgment      Therapy Plan: PT Intensity: Minimum of 1-2 x/day ,45 to 90 minutes PT Frequency: 5 out of 7 days PT Duration Estimated Length of Stay: 3 weeks OT Intensity: Minimum of 1-2 x/day, 45 to 90 minutes OT Frequency: 5 out of 7 days OT Duration/Estimated Length of Stay: 3 weeks SLP Intensity: Minumum of 1-2 x/day, 30 to 90 minutes SLP Frequency: 3 to 5 out of 7 days SLP Duration/Estimated Length of Stay: 3 weeks   Team Interventions: Nursing Interventions Bladder Management, Medication Management, Bowel Management, Disease Management/Prevention,  Cognitive Remediation/Compensation, Patient/Family Education, Pain Management  PT interventions Ambulation/gait training, Warden/ranger, Cognitive remediation/compensation, Community reintegration, Discharge planning, DME/adaptive equipment instruction, Disease management/prevention, Functional electrical stimulation, Functional mobility training, Neuromuscular re-education, Pain management, Psychosocial support, Patient/family education, Skin care/wound management, Splinting/orthotics, Stair training, Therapeutic Activities, Therapeutic Exercise, UE/LE Strength taining/ROM, UE/LE Coordination activities, Visual/perceptual remediation/compensation, Wheelchair propulsion/positioning  OT Interventions Warden/ranger, Discharge planning, Self Care/advanced ADL retraining, Therapeutic Activities, UE/LE Coordination activities, Visual/perceptual remediation/compensation, Therapeutic Exercise, Patient/family education, Functional mobility training, Cognitive remediation/compensation, Community reintegration, Fish farm manager, Neuromuscular re-education, UE/LE Strength taining/ROM  SLP Interventions Cognitive remediation/compensation, Functional tasks, Financial trader, Patient/family education, Internal/external aids, Therapeutic Activities, Therapeutic Exercise, Speech/Language facilitation  TR Interventions    SW/CM Interventions Discharge Planning, Psychosocial Support, Patient/Family Education   Barriers to Discharge MD  Medical stability and cognition  Nursing Decreased caregiver support, Home environment access/layout 2 level 1ste right rail with wife 7/7 upstairs and has DME; mod I with rollator PTA. left side LE weakness  PT Home environment access/layout pt has 1 step to enter, unsure of how much assist wife can provide  OT Home environment access/layout primary bedroom and shower on 2nd floor  SLP Other (comments) PLOF, potential for new learning  SW        Team Discharge Planning: Destination: PT-Home ,OT- Home , SLP-Home Projected Follow-up: PT-Home health PT, 24 hour supervision/assistance, OT-  Home health OT, 24 hour supervision/assistance, SLP-24 hour supervision/assistance, Outpatient SLP Projected Equipment Needs: PT-Wheelchair (measurements), Wheelchair cushion (measurements), OT- 3 in 1 bedside comode,  To be determined, SLP-None recommended by SLP Equipment Details: PT-pt reports he has RW and rollator at home, OT-  Patient/family involved in discharge planning: PT- Patient,  OT-Patient, SLP-Patient  MD ELOS: 20-22d Medical Rehab Prognosis:  Fair Assessment: The patient has been admitted for CIR therapies with the diagnosis of Stroke. The team will be addressing functional mobility, strength, stamina, balance, safety, adaptive techniques and equipment, self-care, bowel and bladder mgt, patient and caregiver education, Chronic low back as well as acute post op pain. Goals have been set at [facility] Anticipated discharge destination is home with family support.        See Team Conference Notes for weekly updates to the plan of care

## 2024-05-20 NOTE — Progress Notes (Signed)
 Patient ID: [name] [name], male   DOB: [birth year], 78 y.o.   MRN: [id number] Spoke with wife via telephone to discuss team conference goals of supervision-min assist level and target discharge date of 10/21. Wife comes daily after his therapies and will be there with him at home 24/7. Discussed coming in and observing in therapies and then closer to discharge date coming in for hands on education in preparation for discharge. She feels he has some work to do to reach these goals. Pt is in agreement and is working hard in therapies. Will continue to work on discharge needs

## 2024-05-20 NOTE — Progress Notes (Signed)
 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 No intravenous contrast was administered.   COMPARISON:  MRI of the lumbar spine March 06, 2018 and January 23, 2013   FINDINGS: Segmentation: A transitional lumbosacral vertebra is assumed to represent the L5 level. Careful correlation with this numbering strategy prior to any procedural intervention would be recommended.   Alignment:  Physiologic.   Vertebrae: Interbody fusion and posterior left transpedicular fixation is noted at L3-4 with laminectomy at L4-5. Degenerative endplate changes are noted at L3-4. Marrow signal characteristics are otherwise maintained.   Conus medullaris and cauda equina: Conus extends to the T12-L1 level. Conus appear normal. Redundancy of nerve roots at the level of L1 is related to L2-3 spinal canal stenosis.   Paraspinal and other soft tissues: Well-defined oval lesion located to the left of midline in the prevertebral soft tissue at the level of L5-S1 is seen in the sagittal views. The lesion as predominantly hypointense signal on T1 and heterogeneous signal on T2. When compared to prior study, the lesion appears with similar size (3.7 x 2.7 cm) when compared to study performed 2019 but enlarged when compared to MRI performed 2014. T2 signal appear changed when compared to prior study.   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   Right renal cysts.   Disc levels:   T12-L1: No spinal canal or neural foraminal stenosis.   L1-2: No spinal canal or neural foraminal stenosis.   L2-3: Disc bulge with superimposed  tiny right central disc protrusion, facet degenerative changes and ligamentum flavum redundancy resulting in moderate to severe spinal canal stenosis, moderate right and severe left neural foraminal stenosis.   L3-4: Laminectomy with facet fusion. No spinal canal or neural foraminal stenosis.   L4-5: Disc bulge with tiny superimposed central disc protrusion and mild facet degenerative changes result in severe bilateral neural foraminal narrowing. Laminectomy is also noted with no spinal canal stenosis.   L5-S1: No spinal canal or neural foraminal stenosis.   Compared to prior study, there has been significant progression of the degree of spinal canal stenosis at L2-3.   IMPRESSION: 1. 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 This has progressed when compared to prior MRI. 2. Well-defined oval lesion the prevertebral soft tissues to the left of midline at the L5-S1 level, only seen on the sagittal images. This appear with similar size although slightly different T2 signal when compared to study performed 2019 but enlarged when compared to study performed in 2014. 3. 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 Recommend followup by ultrasound in 2 years. This recommendation follows ACR consensus guidelines: White Paper of the ACR Incidental Findings Committee II on Vascular Findings. J Am Coll Radiol 2013; 10:  Electronically Signed   By: [name] M.D.   On: 10/22/2019 14:06 MR Lumbar Spine W Wo Contrast Result Date: 05/20/2024 CLINICAL DATA:  Initial evaluation for lumbar radiculopathy, new symptoms. No other relevant history provided. EXAM: MRI LUMBAR SPINE WITHOUT AND WITH CONTRAST TECHNIQUE: Multiplanar and multiecho pulse sequences of the lumbar spine were obtained without  and with intravenous contrast. CONTRAST:  8mL GADAVIST  GADOBUTROL  1 MMOL/ML IV SOLN COMPARISON:  MRI from 10/22/2019 FINDINGS: Segmentation: Transitional features about the lumbosacral junction with transitional L5 segment and rudimentary L5-S1 interspace. Same numbering system is employed as on previous exam. Alignment: Mild sigmoid scoliosis with predominant right convex component. 3 mm degenerative retrolisthesis of L2 on L3. Vertebrae: Postoperative changes from prior PLIF at L3-4. Additional wide posterior decompressive laminectomy at L4-5. Vertebral body height maintained without acute or chronic fracture. Bone marrow signal intensity diffusely heterogeneous but overall within normal limits. No worrisome osseous lesions. Degenerative reactive endplate change with marrow edema present about the L2-3 interspace. No abnormal marrow edema. Conus medullaris and cauda equina: Conus extends to the T12-L1 level. Conus and cauda equina appear normal. There is an apparent 2.5 cm focus of T1 hyperintensity involving the distal thecal sac at the level of S1 (series 5, image 11). This is prominent on postcontrast sequence, but does not clearly enhance relative to precontrast T1 images. Area of saturates on STIR sequence, consistent with fat. Paraspinal and other soft tissues: Chronic postoperative scarring present within the posterior paraspinous soft tissues. Heterogeneous ovoid lesion just to the left of midline at the level of L5-S1 measures 4.2 x 3.1 cm, enlarged from prior MRI (previously 3.7 x 2.7 cm in 2021). 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 Disc levels: L1-2: Negative interspace. Mild bilateral facet and ligament flavum hypertrophy. No spinal stenosis. Foramina remain patent. L2-3: Degenerative vertebral disc space narrowing with disc desiccation and diffuse disc bulge. Reactive endplate change with marginal endplate osteophytic  spurring. Changes asymmetric to the left. Moderate facet and ligament flavum hypertrophy with trace joint effusions. Resultant severe spinal stenosis, with severe right and moderate left L2 foraminal stenosis. Changes are progressed from prior. L3-4: Prior PLIF. No residual spinal stenosis. Foramina appear patent. L4-5: Disc desiccation with diffuse disc bulge. Prior posterior decompression. Moderate right with mild left facet arthrosis. No spinal stenosis. Moderate bilateral L4 foraminal narrowing. L5-S1: Transitional lumbosacral anatomy with rudimentary L5-S1 disc space. No disc bulge or focal disc herniation. Minimal facet spurring. No canal or foraminal stenosis. IMPRESSION: 1. Prior PLIF at L3-4 without residual or recurrent stenosis. 2. Progressive adjacent segment disease with multifactorial degenerative changes at L2-3, resulting in severe spinal stenosis, with severe right and moderate left L2 foraminal narrowing. 3. Disc bulge with facet arthrosis at L4-5 with resultant moderate bilateral L4 foraminal stenosis. 4. 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 Referral to vascular surgery recommended if not already performed. 5. 4.2 x 3.1 cm heterogeneous ovoid lesion just to the left of midline at the level of L5-S1, mildly enlarged from prior MRI from 2021 (previously 3.7 x 2.7 cm). 6. 2.5 cm focus of T1 hyperintensity involving the distal thecal sac at the level of S1, likely fat. Finding is somewhat unusual as this appears to be located within the sac itself and not epidural in location as is typically seen. Finding is of uncertain significance, but new relative to previous MRI from 2021. Electronically Signed  By: Morene Hoard M.D.   On: 05/20/2024 00:13   Recent Labs    05/17/24 1908 05/18/24 0617  WBC 10.1 9.8  HGB 9.2* 8.8*  HCT 28.7* 26.8*  PLT 336 326   Recent Labs    05/17/24 1908 05/18/24 0617  NA 139 138  K 3.9 4.0  CL 106 109  CO2  21* 20*  GLUCOSE 120* 109*  BUN 16 18  CREATININE 1.25* 1.38*  CALCIUM  8.6* 8.5*    Intake/Output Summary (Last 24 hours) at 05/20/2024 0903 Last data filed at 05/20/2024 0111 Gross per 24 hour  Intake 130 ml  Output 200 ml  Net -70 ml        Physical Exam: Vital Signs Blood pressure (!) 143/84, pulse 61, temperature 98.4 F (36.9 C), temperature source Oral, resp. rate 18, height 5' 11 (1.803 m), weight 85 kg, SpO2 99%.   General: No acute distress Mood and affect are appropriate Heart: Regular rate and rhythm no rubs murmurs or extra sounds Lungs: Clear to auscultation, breathing unlabored, no rales or wheezes Abdomen: Positive bowel sounds, soft nontender to palpation, nondistended Extremities: No clubbing, cyanosis, or edema Skin: No evidence of breakdown, no evidence of rash Neurologic: Cranial nerves II through XII intact, motor strength is 5/5 in bilateral deltoid, bicep, tricep, grip,3-/5 R RIght and 2-/5 left  hip flexor, 4- Right and 3- left knee extensors, 5/5 RIght and 4/5 left ankle dorsiflexor and plantar flexor Sensory exam normal sensation to light touch and proprioception in bilateral upper , LT normal bilateral L2,3,4,  Musculoskeletal: Full range of motion in all 4 extremities. No joint swelling Oriented to [place] and place but not time, delayed responses   Assessment/Plan: 1. Functional deficits which require 3+ hours per day of interdisciplinary therapy in a comprehensive inpatient rehab setting. Physiatrist is providing close team supervision and 24 hour management of active medical problems listed below. Physiatrist and rehab team continue to assess barriers to discharge/monitor patient progress toward functional and medical goals  Care Tool:  Bathing    Body parts bathed by patient: Right arm, Left arm, Chest, Front perineal area, Abdomen, Right upper leg, Left upper leg, Face   Body parts bathed by helper: Buttocks, Right lower leg, Left lower  leg     Bathing assist Assist Level: Moderate Assistance - Patient 50 - 74%     Upper Body Dressing/Undressing Upper body dressing   What is the patient wearing?: Pull over shirt    Upper body assist Assist Level: Moderate Assistance - Patient 50 - 74%    Lower Body Dressing/Undressing Lower body dressing      What is the patient wearing?: Pants, Incontinence brief     Lower body assist Assist for lower body dressing: Dependent - Patient 0%     Toileting Toileting    Toileting assist Assist for toileting: Dependent - Patient 0%     Transfers Chair/bed transfer  Transfers assist     Chair/bed transfer assist level: Dependent - mechanical lift     Locomotion Ambulation   Ambulation assist   Ambulation activity did not occur: Safety/medical concerns          Walk 10 feet activity   Assist  Walk 10 feet activity did not occur: Safety/medical concerns        Walk 50 feet activity   Assist Walk 50 feet with 2 turns activity did not occur: Safety/medical concerns         Walk 150 feet activity  Assist Walk 150 feet activity did not occur: Safety/medical concerns         Walk 10 feet on uneven surface  activity   Assist Walk 10 feet on uneven surfaces activity did not occur: Safety/medical concerns         Wheelchair     Assist Is the patient using a wheelchair?: Yes Type of Wheelchair: Manual      Max wheelchair distance: 150    Wheelchair 50 feet with 2 turns activity    Assist        Assist Level: Contact Guard/Touching assist   Wheelchair 150 feet activity     Assist      Assist Level: Contact Guard/Touching assist   Blood pressure (!) 143/84, pulse 61, temperature 98.4 F (36.9 C), temperature source Oral, resp. rate 18, height 5' 11 (1.803 m), weight 85 kg, SpO2 99%.  Medical Problem List and Plan: 1. 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  Antithrombotics: -DVT/anticoagulation:  Pharmaceutical: Lovenox , age indeterminate asymptomatic Left Fem DVT, per Dr Sheree VVS, cont current meds, no activity restriction              -antiplatelet therapy: continue ASA   3. Pain Management: continue Oxycodone  prn for severe and tylenol  prn for mild pain   4. Mood/Behavior/Sleep: LCSW to follow for evaluation and support.              -antipsychotic agents: N/A   5. Neuropsych/cognition: This patient is not capable of making decisions on his own behalf. 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 Skin/Wound Care: Routine pressure relief measures. Monitor incision for healing   7. Fluids/Electrolytes/Nutrition:  Monitor I/O. Check CMET in am             --offer supplement prn   8. T2DM: Monitor BS ac/hs and use SSI for elevated BS             --Was on metform 500 mg bid--held due to dye study, consider resuming once kidney function improves   9.HTN: Monitor BP TID--off home meds at this time. BP reviewed and is starting to increase, will start amlodipine  2.5mg  daily Vitals:   05/19/24 1943 05/20/24 0402  BP: 137/74 (!) 143/84  Pulse: 69 61  Resp: 18 18  Temp: 98.6 F (37 C) 98.4 F (36.9 C)  SpO2: 98% 99%   Monitor effect for 1-2 more days and adjust dose if needed    10. AKI: BUN/SCr 11/1.21 at admission with rise to 19/1.54 post procedure                 Latest Ref Rng & Units 05/18/2024    6:17 AM 05/17/2024    7:08 PM 05/16/2024    5:42 PM  BMP  Glucose 70 - 99 mg/dL 890  879    BUN 8 - 23 mg/dL 18  16    Creatinine 9.38 - 1.24 mg/dL 8.61  8.74  8.69   Sodium 135 - 145 mmol/L 138  139    Potassium 3.5 - 5.1 mmol/L 4.0  3.9    Chloride 98 - 111 mmol/L 109  106    CO2 22 - 32 mmol/L 20  21    Calcium  8.9 - 10.3 mg/dL 8.5  8.6        11. Thrombocytopenia:  Normal at admission--153 but down to 93 and recovering --Likely reactive but will monitor for signs of bleeding.    12.  ABLA: Recheck stable no hematuria, melena noted, cont to monitor at least weekly     Latest Ref Rng & Units 05/18/2024    6:17 AM 05/17/2024    7:08 PM 05/11/2024    2:43 PM  CBC  WBC 4.0 - 10.5 K/uL 9.8  10.1  12.4   Hemoglobin 13.0 - 17.0 g/dL 8.8  9.2  9.2   Hematocrit 39.0 - 52.0 % 26.8  28.7  28.0   Platelets 150 - 400 K/uL 326  336  106       13. Leucocytosis: resolved    14.  H/o dementia: continue Namenda .   15.  Bilateral LE weakness proximally and Left distal LE weakness with hx Lumbar stenosis , suspect progression since prior Lumbar MRI in 2021, will order with contrast since pt has had L3-4 fusion per Dr Beuford in the past  Pt had repeat MRI lumbar showing intrathecal T1 signal at L5-S1 as well as prevertebral mass at that level will ask Dr Beuford to review MRI Lumbar  LOS: 3 days A FACE TO FACE EVALUATION WAS PERFORMED  Prentice FORBES Compton 05/20/2024, 9:03 AM

## 2024-05-20 NOTE — Progress Notes (Signed)
 Physical Therapy Session Note  Patient Details  Name: [name] [name] MRN: [id number] Date of Birth: 05/16/1946  Today's Date: 05/20/2024 PT Individual Time: 0925-1020 PT Individual Time Calculation (min): 55 min   Short Term Goals: Week 1:  PT Short Term Goal 1 (Week 1): Pt will be able to perform bed mobility with mod A PT Short Term Goal 2 (Week 1): Pt will be able to perform transfers bed <> w/c with mod A PT Short Term Goal 3 (Week 1): Pt will initiate gait PT Short Term Goal 4 (Week 1): Pt will demonstrate dynamic sitting balance with min A  Skilled Therapeutic Interventions/Progress Updates:  Patient seated upright in w/c on entrance to room. Patient alert and agreeable to PT session however has not yet eaten breakfast as he was with OT when breakfast arrived and only had 15 between sessions. Returned to room in and pt finishing final bites.   Patient with no pain complaint at start of session.  OT has provided pt with occlusion glasses d/t related double vision.   Transported to ortho gym dependently in w/c for energy conservation.   Therapeutic Activity: Transfers: Pt performed squat pivot from w/c to mat table requiring vc/ tc for positioning, hand placement, foot placement and to initiate forward lean. Completes with improve motor control as compared to yesterday's session.   Neuromuscular Re-ed: NMR facilitated during session for standing balance and proprioception. Pt guided in focus on sequencing and technique required to complete sit<>stand with reduced LOA. Progressed sit<>stand from higher mat table height to lower table height throughout. Requires increased cues initially and improves to min cues by end of training. Is able to correctly scoot forward to edge of seat, requires continued cues for more posterior feet positioning and split hand positioning, flexing forward at hips and rising to stand. Improves throughout despite lowered seat height from need for Min/  ModA to CGA/ MinA and demonstrating decreased posterior lean throughout. In fact, demos no LOB or melt posteriorly while seated on mat table this session.   NMR performed for improvements in motor control and coordination, balance, sequencing, judgement, and self confidence/ efficacy in performing all aspects of mobility at highest level of independence.   Gait Training:  Pt ambulated 20' x1/ 24' x1 ft using RW with MinA and close w/c follow. Demonstrated flexed posture and knees with decreased but adequate step height. Provided vc/ tc for improving upright posture to his comfort level (has severe stenosis in spine) and with upright posture, demos improved ability to advance LLE.  After return to room, pt requests to brush teeth again and is able to self propel to sink with supervision requiring MinA for final positioning at sink. Is able to correctly but slowly sequence all aspects of brushing teeth completing and setup and clean up. Does require setup of spit tray when he is initially unable to fully lean forward to reach sink d/t soreness in abdomen/ chest.   Patient seated upright in w/c at end of session with brakes locked, belt alarm set, and all needs within reach.   Therapy Documentation Precautions:  Precautions Precautions: Fall Recall of Precautions/Restrictions: Impaired Precaution/Restrictions Comments: incision near L clavicle (axillary artery) and bil groins; L weakness Restrictions Weight Bearing Restrictions Per Provider Order: No  Pain:  No pain related this session.    Therapy/Group: Individual Therapy  [name] PT, DPT, CSRS 05/20/2024, 5:26 PM

## 2024-05-21 DIAGNOSIS — R29898 Other symptoms and signs involving the musculoskeletal system: Secondary | ICD-10-CM | POA: Diagnosis not present

## 2024-05-21 DIAGNOSIS — I639 Cerebral infarction, unspecified: Secondary | ICD-10-CM | POA: Diagnosis not present

## 2024-05-21 DIAGNOSIS — I71019 Dissection of thoracic aorta, unspecified: Secondary | ICD-10-CM | POA: Diagnosis not present

## 2024-05-21 LAB — BASIC METABOLIC PANEL WITH GFR
Anion gap: 12 (ref 5–15)
BUN: 27 mg/dL — ABNORMAL HIGH (ref 8–23)
CO2: 21 mmol/L — ABNORMAL LOW (ref 22–32)
Calcium: 8.7 mg/dL — ABNORMAL LOW (ref 8.9–10.3)
Chloride: 105 mmol/L (ref 98–111)
Creatinine, Ser: 1.53 mg/dL — ABNORMAL HIGH (ref 0.61–1.24)
GFR, Estimated: 46 mL/min — ABNORMAL LOW (ref >60)
Glucose, Bld: 106 mg/dL — ABNORMAL HIGH (ref 70–99)
Potassium: 3.8 mmol/L (ref 3.5–5.1)
Sodium: 138 mmol/L (ref 135–145)

## 2024-05-21 LAB — GLUCOSE, CAPILLARY
Glucose-Capillary: 108 mg/dL — ABNORMAL HIGH (ref 70–99)
Glucose-Capillary: 120 mg/dL — ABNORMAL HIGH (ref 70–99)
Glucose-Capillary: 98 mg/dL (ref 70–99)
Glucose-Capillary: 113 mg/dL — ABNORMAL HIGH (ref 70–99)

## 2024-05-21 LAB — CBC
HCT: 28.1 % — ABNORMAL LOW (ref 39.0–52.0)
Hemoglobin: 8.9 g/dL — ABNORMAL LOW (ref 13.0–17.0)
MCH: 28.1 pg (ref 26.0–34.0)
MCHC: 31.7 g/dL (ref 30.0–36.0)
MCV: 88.6 fL (ref 80.0–100.0)
Platelets: 398 K/uL (ref 150–400)
RBC: 3.17 MIL/uL — ABNORMAL LOW (ref 4.22–5.81)
RDW: 14.7 % (ref 11.5–15.5)
WBC: 10.1 K/uL (ref 4.0–10.5)
nRBC: 0 % (ref 0.0–0.2)

## 2024-05-21 MED ORDER — AMLODIPINE BESYLATE 5 MG PO TABS
5.0000 mg | ORAL_TABLET | Freq: Every day | ORAL | Status: DC
  Administered 2024-05-21 – 2024-05-26 (×6): 5 mg via ORAL
  Filled 2024-05-21 (×6): qty 1

## 2024-05-21 NOTE — Progress Notes (Signed)
 Physical Therapy Session Note  Patient Details  Name: [name] [name] MRN: [id number] Date of Birth: 05/30/1946  Today's Date: 05/21/2024 PT Individual Time: 9195-9140 + [id number] PT Individual Time Calculation (min): 55 min  + 72 min  Short Term Goals: Week 1:  PT Short Term Goal 1 (Week 1): Pt will be able to perform bed mobility with mod A PT Short Term Goal 2 (Week 1): Pt will be able to perform transfers bed <> w/c with mod A PT Short Term Goal 3 (Week 1): Pt will initiate gait PT Short Term Goal 4 (Week 1): Pt will demonstrate dynamic sitting balance with min A  Skilled Therapeutic Interventions/Progress Updates:      1st session: Pt presents in bed - has no reports of pain. Assisted patient with getting dressed. Needs assist for threading pants over feet at bed level. Difficulty reaching past knees to pull pants up. He was unable to bridge in bed, even by bracing both feet into the bed. Ultimately needed to roll in bed to get pants up. Patient with difficulty motor planning (?) to complete bed mobility, needing multi-modal cueing to help with initiation. TotalA for donning socks and shoes for time management.  With orientation questions, pt oriented to year only, unable to state month or date. Pt oriented to location and situation and self. Reoriented to date - moved printable calendar in room to better visualize.  Pt assisted to EOB with maxA with HOB flat and use of bed rails. Needs minA for initial sitting balance due posterior LOB.   Sit<>stand in [place] from raised EOB with minA with cues for hand placement and forward weight shifting. In perched position to promote weight bearing and LE endurance training, completed oral care at the sink. Pt needing setupA for oral care.   Pt propelled himself at w/c level ~179ft with supervision with BUE to propel - difficult for him to maintain straight path and veers R frequently. Cues for straight path driving.   Patient setup in // bars -  worked on scooting forward to edge of w/c and sit<>stands in // bars. Patient has difficulty motor planning movements - gets stuck to the wheelchair. Worked on lateral weight shifting while seated and hip hiking to help with scooting. He was able to pull himself to stand in // bars with CGA, relies heavily on UE to pull to stand. Unable to push from arm rests to stand.   Returned patient to his room - ended session seated in w/c with seat belt alarm on. Pt made aware of his daily therapy schedule.    2nd session: Patient being assisted back to his wheelchair by x2 NT who just finished assisting patient with a shower. Pt agreeable to PT treatment.   Patient transported at w/c level for time management to // bars.  -3x3 sit<>stands & stand pivot transfers with CGA. Emphasis on LLE awareness and LLE activation in stance -forward/backward walking 3x47ft with minA -lateral stepping L<>R 3x16ft with minA *seated rest breaks b/w sets. LLE begins to fatigue quickly with genu valgus and hip adduction.   Initiated stair training with the 6 steps and 2 hand rails. Pt navigated up/down x4 steps total with min/modA with cues for step-to pattern for both directions and for sequencing with R foot leading ascent and L leading descent.   Car transfer training completed with car height simulating mid size SUV - pt needing mod to maxA for completing transfer. Pt has difficulty lifting LLE to clear floor  height, also has difficulty repositioning his hips and legs in the car.   Pt returned to his room and was left sitting up with his wife present. Updated on pt's therapy session. Seat belt alarm on.    Therapy Documentation Precautions:  Precautions Precautions: Fall Recall of Precautions/Restrictions: Impaired Precaution/Restrictions Comments: incision near L clavicle (axillary artery) and bil groins; L weakness Restrictions Weight Bearing Restrictions Per Provider Order: No General:       Therapy/Group: Individual Therapy  [name] P Shantil Vallejo 05/21/2024, 8:00 AM

## 2024-05-21 NOTE — Progress Notes (Signed)
 Occupational Therapy Session Note  Patient Details  Name: [name] [name] MRN: [id number] Date of Birth: 12-29-1945  Today's Date: 05/21/2024 OT Individual Time: 1350-1500 OT Individual Time Calculation (min): 70 min    Short Term Goals: Week 1:  OT Short Term Goal 1 (Week 1): Patient to perform toilet transfer with Mod assist of one OT Short Term Goal 2 (Week 1): Patient to perform UB dressing with Min assist OT Short Term Goal 3 (Week 1): Patient to perform grooming with set up seated at the sink  Skilled Therapeutic Interventions/Progress Updates:  Pt greeted in w/c with NT present assisting pt with toilet transfer with stedy , pt agreeable to OT intervention.      Transfers/bed mobility/functional mobility:  Pt completed sit>stand transfers with RW with initial MODA but progressed to MINA  with blocked practice. Pt requires consistent cues for hand placement during each trial. Pt completed stand pivot transfers to R side with RW with MODA, pt requires assist to manage Rw and to reach back when sitting.  Pt completed squat pivot transfers to R and L side with MODA. Pt initially tries to stand but with MAX cues pt able to correct to scoot vs stand.  Therapeutic activity:  Pt completed dynamic reaching task with RW with pt able to stand to RW with MODA and reach forward and OH to remove squigz from mirror with a focus on facilitating improved upright posture as pt tends to stand with flexed posture. MAX cues need to shift hips forward and stand upright with mirror provided for visual feedback.     ADLs:  UB dressing: pt requested to change his shirt once he was back in bed at end of session. Pt able to pull on bil bed rails to shift trunk forward to allow therapist to pull shirt OH to doff. Donned new shirt from bed level with overall MODA.   Transfers: NT initiated stedy transfer to [facility] over toilet. Pt able to stand to stedy with MINA, dependent transfer to/from toilet in stedy.   Toileting: pt unable to void b/b but still required MAX A for clothing mgmt in standing.   Exercises:  Pt reports that his biggest deficit post stroke feels like a weak core. Pt completed below therex to improve abdominal strength for higher level functional mobility tasks.   Pt completed modified crunches from EOM with pt leaning back to wedge and then using abdominal muscles to return to sitting, pt needed MODA to return to midline.   Pt completed modified russian twist with pt holding ball at chest and pt instructed to rotate core side/side to facilitate improved oblique strength and endurance. Pt required MODA to eccentric control in trunk.   Assessments:  Box and Blocks Test measures unilateral gross manual dexterity. - Instructions The pt was instructed to carry one block over at a time and go as quickly as they could, making sure their fingertips crossed the partition. One minute was given to complete the task per UE. The pt was allowed a 15-second trial period prior to testing if needed. - Results The pt transferred 27 blocks with the R hand and 29 with the L hand. The total number of blocks carried from one compartment to the other in one minute is scored per hand. Higher scores on the test indicate better gross manual dexterity.  - Norms for adults males 50-75+ -50-54 R 79 L 77.0 -55-59 R 75.2 L 73.8 -60-64 R 71.3 L 70.5 -65-69 R 68.5 L 67.4 -  70-74 R 66.3 L 64.33 -75+ R 63.0 L 61.3   Ended session with pt supine in bed with all needs within reach and bed alarm activated.                    Therapy Documentation Precautions:  Precautions Precautions: Fall Recall of Precautions/Restrictions: Impaired Precaution/Restrictions Comments: incision near L clavicle (axillary artery) and bil groins; L weakness Restrictions Weight Bearing Restrictions Per Provider Order: No General:   Vital Signs: Therapy Vitals Temp: 98.5 F (36.9 C) Pulse Rate: 73 Resp: 16 BP:  125/73 Patient Position (if appropriate): Sitting Oxygen Therapy SpO2: 100 % O2 Device: Room Air Pain:   ADL: ADL Eating: Set up Grooming: Minimal assistance Where Assessed-Grooming: Sitting at sink Upper Body Bathing: Minimal assistance Where Assessed-Upper Body Bathing: Wheelchair, Sitting at sink Lower Body Bathing: Dependent Where Assessed-Lower Body Bathing: Sitting at sink, Wheelchair Upper Body Dressing: Moderate assistance Where Assessed-Upper Body Dressing: Wheelchair Lower Body Dressing: Dependent Where Assessed-Lower Body Dressing: Wheelchair Toileting: Dependent Toilet Transfer: Dependent Statistician Method: Surveyor, minerals: Grab bars, Raised toilet seat Tub/Shower Transfer: Unable to assess Tub/Shower Transfer Method: Unable to assess Film/video editor: Unable to assess Film/video editor Method: Unable to assess ADL Comments: Not safe for shower stall transfer, Patient with significant posterior lean and motor planning deficits. Vision   Perception    Praxis   Balance   Exercises:   Other Treatments:     Therapy/Group: Individual Therapy  Ronal Mallie Needy 05/21/2024, 3:14 PM

## 2024-05-21 NOTE — Patient Care Conference (Signed)
 Inpatient RehabilitationTeam Conference and Plan of Care Update Date: 05/20/2024   Time: 10:31 AM    Patient Name: [name] [name]      Medical Record Number: [id number]  Date of Birth: [birth year] Sex: Male         Room/Bed: 4M08C/4M08C-01 Payor Info: Payor: [employer]vertising copy[employer] MEDICARE / Plan: [facility] MEDICARE / Product Type: *No Product type* /    Admit Date/Time:  05/17/2024  5:41 PM  Primary Diagnosis:  Stroke (cerebrum) [facility])  Hospital Problems: Principal Problem:   Stroke (cerebrum) [facility])    Expected Discharge Date: Expected Discharge Date: 06/09/24  Team Members Present: Physician leading conference: Dr. Prentice Compton Social Worker Present: Rhoda Clement, LCSW Nurse Present: Barnie Ronde, RN PT Present: Recardo Milliner, PT OT Present: Recardo Maxwell, OT SLP Present: Recardo Mole, SLP     Current Status/Progress Goal Weekly Team Focus  Bowel/Bladder   Incontinent of bowel and bladder; LBM 05/16/24   Regain continence of bowel and bladder.   Assess bowel and bladder needs q 4 hours while awake and PRN.    Swallow/Nutrition/ Hydration   regular / thin           ADL's   supervision oral care, mod UB, max LB,  max sit to stand/transfers,  posterior lean in stitting and rising to stand,  decreased attention/ sequencing   CGA overall   ADL training, cognitive training,  balance, functional mobility, pt/fam education    Mobility   Bed mobility = MaxA, transfers = Mod/ MaxA, ambulation = very short distances, ModA using RW, w/c mobility = CGA   supervision/ CGA for bed mobility and transfers, MinA for ambulation, supervision for w/c mobility  Barriers: abdominal/ thoracic pain, generalized weakness as well as L hemipareisis, cognition /// Work on: general and L sided strengthening, LOA in all mobility, quality of gait, standing balance and posture, family education    Communication                Safety/Cognition/ Behavioral Observations  moderate to severe cognitive  deficits - variable on level of fatigue. Limited endurance; reduced memory, attention, initiation, problem solving, and processing/thought formulation   supervision - minA   cognitive re training, compensatory memory strategies, pt/family education    Pain   Patient denies pain.   Patient rates pain < 3/10.   Assess pain q shift and PRN.    Skin   Skin is intact.   Remain free from skin breakdown.  Assess skin q shift and PRN.      Discharge Planning:  Wife plans to take husband home and provide assist. May need to come in and observe in therapies to see the amount of care pt will require. She has helped prior to admission with bathing and dressing. 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  Functional status varies with fatigue and poor attention wit posterior lean and poor attention with spinal stenosis.  Patient on target to meet rehab goals: Currently needs max assist for sit - stand. Able to ambulate up to 10'. Goals for discharge set for CGA- min assist with min assist for gait.  *See Care Plan and progress notes for long and short-term goals.   Revisions to Treatment Plan:  N/a   Teaching Needs: Safety, medications, dietary modification, transfers, toileting, etc.   Current Barriers to Discharge: Decreased caregiver support  Possible Resolutions  to Barriers: Family education     Medical Summary Current Status: LE weakness with L>R no new bowel or bladder issues  Barriers to Discharge: Medical stability   Possible Resolutions to Barriers/Weekly Focus: MRI reviewed, may need ortho input   Continued Need for Acute Rehabilitation Level of Care: The patient requires daily medical management by a physician with specialized training in physical medicine and rehabilitation for the following reasons: Direction of a multidisciplinary  physical rehabilitation program to maximize functional independence : Yes Medical management of patient stability for increased activity during participation in an intensive rehabilitation regime.: Yes Analysis of laboratory values and/or radiology reports with any subsequent need for medication adjustment and/or medical intervention. : Yes   I attest that I was present, lead the team conference, and concur with the assessment and plan of the team.   [name] 05/21/2024, 4:12 PM

## 2024-05-21 NOTE — Progress Notes (Signed)
 PROGRESS NOTE   Subjective/Complaints:  No issues overnite, discussed MRI lumbar spine and d/c date.  Pt would like to leave sooner and we discussed that it is based on his progress in therapy   ROS- denies CP, SOB, N/V/D  Objective:  MRI LUMBAR SPINE WITHOUT CONTRAST   TECHNIQUE: Multiplanar, multisequence MR imaging of the lumbar spine was performed. No intravenous contrast was administered.   COMPARISON:  MRI of the lumbar spine March 06, 2018 and January 23, 2013   FINDINGS: Segmentation: A transitional lumbosacral vertebra is assumed to represent the L5 level. Careful correlation with this numbering strategy prior to any procedural intervention would be recommended.   Alignment:  Physiologic.   Vertebrae: Interbody fusion and posterior left transpedicular fixation is noted at L3-4 with laminectomy at L4-5. Degenerative endplate changes are noted at L3-4. Marrow signal characteristics are otherwise maintained.   Conus medullaris and cauda equina: Conus extends to the T12-L1 level. Conus appear normal. Redundancy of nerve roots at the level of L1 is related to L2-3 spinal canal stenosis.   Paraspinal and other soft tissues: Well-defined oval lesion located to the left of midline in the prevertebral soft tissue at the level of L5-S1 is seen in the sagittal views. The lesion as predominantly hypointense signal on T1 and heterogeneous signal on T2. When compared to prior study, the lesion appears with similar size (3.7 x 2.7 cm) when compared to study performed 2019 but enlarged when compared to MRI performed 2014. T2 signal appear changed when compared to prior study.   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   Right renal cysts.   Disc levels:   T12-L1: No spinal canal or neural foraminal stenosis.   L1-2: No spinal canal or neural foraminal stenosis.   L2-3: Disc bulge with  superimposed tiny right central disc protrusion, facet degenerative changes and ligamentum flavum redundancy resulting in moderate to severe spinal canal stenosis, moderate right and severe left neural foraminal stenosis.   L3-4: Laminectomy with facet fusion. No spinal canal or neural foraminal stenosis.   L4-5: Disc bulge with tiny superimposed central disc protrusion and mild facet degenerative changes result in severe bilateral neural foraminal narrowing. Laminectomy is also noted with no spinal canal stenosis.   L5-S1: No spinal canal or neural foraminal stenosis.   Compared to prior study, there has been significant progression of the degree of spinal canal stenosis at L2-3.   IMPRESSION: 1. 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 This has progressed when compared to prior MRI. 2. Well-defined oval lesion the prevertebral soft tissues to the left of midline at the L5-S1 level, only seen on the sagittal images. This appear with similar size although slightly different T2 signal when compared to study performed 2019 but enlarged when compared to study performed in 2014. 3. 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 Recommend followup by ultrasound in 2 years. This recommendation follows ACR consensus guidelines: White Paper of the ACR Incidental Findings Committee II on Vascular Findings. J Am Coll Radiol  2013; 10:     Electronically Signed   By: [name] M.D.   On: 10/22/2019 14:06 MR Lumbar Spine W Wo Contrast Result Date: 05/20/2024 CLINICAL DATA:  Initial evaluation for lumbar radiculopathy, new symptoms. No other relevant history provided. EXAM: MRI LUMBAR SPINE WITHOUT AND WITH CONTRAST TECHNIQUE: Multiplanar and multiecho pulse sequences of the lumbar spine were  obtained without and with intravenous contrast. CONTRAST:  8mL GADAVIST  GADOBUTROL  1 MMOL/ML IV SOLN COMPARISON:  MRI from 10/22/2019 FINDINGS: Segmentation: Transitional features about the lumbosacral junction with transitional L5 segment and rudimentary L5-S1 interspace. Same numbering system is employed as on previous exam. Alignment: Mild sigmoid scoliosis with predominant right convex component. 3 mm degenerative retrolisthesis of L2 on L3. Vertebrae: Postoperative changes from prior PLIF at L3-4. Additional wide posterior decompressive laminectomy at L4-5. Vertebral body height maintained without acute or chronic fracture. Bone marrow signal intensity diffusely heterogeneous but overall within normal limits. No worrisome osseous lesions. Degenerative reactive endplate change with marrow edema present about the L2-3 interspace. No abnormal marrow edema. Conus medullaris and cauda equina: Conus extends to the T12-L1 level. Conus and cauda equina appear normal. There is an apparent 2.5 cm focus of T1 hyperintensity involving the distal thecal sac at the level of S1 (series 5, image 11). This is prominent on postcontrast sequence, but does not clearly enhance relative to precontrast T1 images. Area of saturates on STIR sequence, consistent with fat. Paraspinal and other soft tissues: Chronic postoperative scarring present within the posterior paraspinous soft tissues. Heterogeneous ovoid lesion just to the left of midline at the level of L5-S1 measures 4.2 x 3.1 cm, enlarged from prior MRI (previously 3.7 x 2.7 cm in 2021). 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 Disc levels: L1-2: Negative interspace. Mild bilateral facet and ligament flavum hypertrophy. No spinal stenosis. Foramina remain patent. L2-3: Degenerative vertebral disc space narrowing with disc desiccation and diffuse disc bulge. Reactive endplate change with marginal endplate  osteophytic spurring. Changes asymmetric to the left. Moderate facet and ligament flavum hypertrophy with trace joint effusions. Resultant severe spinal stenosis, with severe right and moderate left L2 foraminal stenosis. Changes are progressed from prior. L3-4: Prior PLIF. No residual spinal stenosis. Foramina appear patent. L4-5: Disc desiccation with diffuse disc bulge. Prior posterior decompression. Moderate right with mild left facet arthrosis. No spinal stenosis. Moderate bilateral L4 foraminal narrowing. L5-S1: Transitional lumbosacral anatomy with rudimentary L5-S1 disc space. No disc bulge or focal disc herniation. Minimal facet spurring. No canal or foraminal stenosis. IMPRESSION: 1. Prior PLIF at L3-4 without residual or recurrent stenosis. 2. Progressive adjacent segment disease with multifactorial degenerative changes at L2-3, resulting in severe spinal stenosis, with severe right and moderate left L2 foraminal narrowing. 3. Disc bulge with facet arthrosis at L4-5 with resultant moderate bilateral L4 foraminal stenosis. 4. 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 Referral to vascular surgery recommended if not already performed. 5. 4.2 x 3.1 cm heterogeneous ovoid lesion just to the left of midline at the level of L5-S1, mildly enlarged from prior MRI from 2021 (previously 3.7 x 2.7 cm). 6. 2.5 cm focus of T1 hyperintensity involving the distal thecal sac at the level of S1, likely fat. Finding is somewhat unusual as this appears to be located within the sac itself and not epidural in location as is typically seen. Finding is of uncertain significance, but new relative to  previous MRI from 2021. Electronically Signed   By: Morene Hoard M.D.   On: 05/20/2024 00:13   Recent Labs    05/21/24 0513  WBC 10.1  HGB 8.9*  HCT 28.1*  PLT 398   Recent Labs    05/21/24 0513  NA 138  K 3.8  CL 105  CO2 21*  GLUCOSE 106*  BUN 27*  CREATININE 1.53*   CALCIUM  8.7*    Intake/Output Summary (Last 24 hours) at 05/21/2024 0709 Last data filed at 05/21/2024 0542 Gross per 24 hour  Intake 686 ml  Output 400 ml  Net 286 ml        Physical Exam: Vital Signs Blood pressure (!) 147/89, pulse 71, temperature 97.9 F (36.6 C), temperature source Oral, resp. rate 18, height 5' 11 (1.803 m), weight 85 kg, SpO2 95%.   General: No acute distress Mood and affect are appropriate Heart: Regular rate and rhythm no rubs murmurs or extra sounds Lungs: Clear to auscultation, breathing unlabored, no rales or wheezes Abdomen: Positive bowel sounds, soft nontender to palpation, nondistended Extremities: No clubbing, cyanosis, or edema Skin: No evidence of breakdown, no evidence of rash Neurologic: Cranial nerves II through XII intact, motor strength is 5/5 in bilateral deltoid, bicep, tricep, grip,3-/5 R RIght and 2-/5 left  hip flexor, 4- Right and 3- left knee extensors, 5/5 RIght and 4/5 left ankle dorsiflexor and plantar flexor Sensory exam normal sensation to light touch and proprioception in bilateral upper , LT normal bilateral L2,3,4,  Musculoskeletal: Full range of motion in all 4 extremities. No joint swelling Oriented to Lachman and place but not time, delayed responses   Assessment/Plan: 1. Functional deficits which require 3+ hours per day of interdisciplinary therapy in a comprehensive inpatient rehab setting. Physiatrist is providing close team supervision and 24 hour management of active medical problems listed below. Physiatrist and rehab team continue to assess barriers to discharge/monitor patient progress toward functional and medical goals  Care Tool:  Bathing    Body parts bathed by patient: Right arm, Left arm, Chest, Front perineal area, Abdomen, Right upper leg, Left upper leg, Face   Body parts bathed by helper: Buttocks, Right lower leg, Left lower leg     Bathing assist Assist Level: Moderate Assistance - Patient 50  - 74%     Upper Body Dressing/Undressing Upper body dressing   What is the patient wearing?: Pull over shirt    Upper body assist Assist Level: Moderate Assistance - Patient 50 - 74%    Lower Body Dressing/Undressing Lower body dressing      What is the patient wearing?: Pants, Incontinence brief     Lower body assist Assist for lower body dressing: Maximal Assistance - Patient 25 - 49%     Toileting Toileting    Toileting assist Assist for toileting: Dependent - Patient 0%     Transfers Chair/bed transfer  Transfers assist     Chair/bed transfer assist level: Moderate Assistance - Patient 50 - 74% (scoot transfer to R side)     Locomotion Ambulation   Ambulation assist   Ambulation activity did not occur: Safety/medical concerns          Walk 10 feet activity   Assist  Walk 10 feet activity did not occur: Safety/medical concerns        Walk 50 feet activity   Assist Walk 50 feet with 2 turns activity did not occur: Safety/medical concerns         Walk 150  feet activity   Assist Walk 150 feet activity did not occur: Safety/medical concerns         Walk 10 feet on uneven surface  activity   Assist Walk 10 feet on uneven surfaces activity did not occur: Safety/medical concerns         Wheelchair     Assist Is the patient using a wheelchair?: Yes Type of Wheelchair: Manual      Max wheelchair distance: 150    Wheelchair 50 feet with 2 turns activity    Assist        Assist Level: Contact Guard/Touching assist   Wheelchair 150 feet activity     Assist      Assist Level: Contact Guard/Touching assist   Blood pressure (!) 147/89, pulse 71, temperature 97.9 F (36.6 C), temperature source Oral, resp. rate 18, height 5' 11 (1.803 m), weight 85 kg, SpO2 95%.  Medical Problem List and Plan: 1. Functional deficits secondary to right cerebellar small deep infarct             -patient may shower              -ELOS/Goals: 06/09/24 S/MinA  Cont CIR PT, OT, SLP    2.  Antithrombotics: -DVT/anticoagulation:  Pharmaceutical: Lovenox , age indeterminate asymptomatic Left Fem DVT, per Dr Sheree VVS, cont current meds, no activity restriction              -antiplatelet therapy: continue ASA   3. Pain Management: continue Oxycodone  prn for severe and tylenol  prn for mild pain   4. Mood/Behavior/Sleep: LCSW to follow for evaluation and support.              -antipsychotic agents: N/A   5. Neuropsych/cognition: This patient is not capable of making decisions on his own behalf. 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 Skin/Wound Care: Routine pressure relief measures. Monitor incision for healing   7. Fluids/Electrolytes/Nutrition:  Monitor I/O. Check CMET in am             --offer supplement prn   8. T2DM: Monitor BS ac/hs and use SSI for elevated BS             --Was on metform 500 mg bid--held due to dye study, consider resuming once kidney function improves   9.HTN: Monitor BP TID--off home meds at this time. BP reviewed and is starting to increase, will start amlodipine  2.5mg  daily Vitals:   05/20/24 1934 05/21/24 0315  BP: 131/81 (!) 147/89  Pulse: 79 71  Resp: 18 18  Temp: 97.7 F (36.5 C) 97.9 F (36.6 C)  SpO2: 99% 95%  Increase amlodipine  to 5mg  daily    10. AKI: BUN/SCr 11/1.21 at admission with rise to 19/1.54 post procedure                 Latest Ref Rng & Units 05/21/2024    5:13 AM 05/18/2024    6:17 AM 05/17/2024    7:08 PM  BMP  Glucose 70 - 99 mg/dL 893  890  879   BUN 8 - 23 mg/dL 27  18  16    Creatinine 0.61 - 1.24 mg/dL 8.46  8.61  8.74   Sodium 135 - 145 mmol/L 138  138  139   Potassium 3.5 - 5.1 mmol/L 3.8  4.0  3.9   Chloride 98 - 111 mmol/L 105  109  106   CO2 22 -  32 mmol/L 21  20  21    Calcium  8.9 - 10.3 mg/dL 8.7  8.5  8.6      BUN and Creat creeping up , oral fluid intake is  poor , will write IVF at noc if taking <1L 11. Thrombocytopenia: Normal at admission--153 but down to 93 and recovering --Likely reactive but will monitor for signs of bleeding.    12.  ABLA: Recheck stable no hematuria, melena noted, cont to monitor at least weekly     Latest Ref Rng & Units 05/21/2024    5:13 AM 05/18/2024    6:17 AM 05/17/2024    7:08 PM  CBC  WBC 4.0 - 10.5 K/uL 10.1  9.8  10.1   Hemoglobin 13.0 - 17.0 g/dL 8.9  8.8  9.2   Hematocrit 39.0 - 52.0 % 28.1  26.8  28.7   Platelets 150 - 400 K/uL 398  326  336    stable   13. Leucocytosis: resolved    14.  H/o dementia: continue Namenda .   15.  Bilateral LE weakness proximally and Left distal LE weakness with hx Lumbar stenosis , suspect progression since prior Lumbar MRI in 2021, will order with contrast since pt has had L3-4 fusion per Dr Beuford in the past  Pt had repeat MRI lumbar showing progressive L2-3 central stenosis , severe bilateral L4-5 foraminal stenosis, intrathecal T1 signal at L5-S1 as well as prevertebral mass at that level will ask Dr Beuford to review MRI Lumbar  Will need f/u with Dr Beuford post discharge  LOS: 4 days A FACE TO FACE EVALUATION WAS PERFORMED  Prentice FORBES Compton 05/21/2024, 7:09 AM

## 2024-05-21 NOTE — Plan of Care (Signed)
  Problem: Consults Goal: RH GENERAL PATIENT EDUCATION Description: See Patient Education module for education specifics. Outcome: Progressing   Problem: RH BOWEL ELIMINATION Goal: RH STG MANAGE BOWEL WITH ASSISTANCE Description: STG Manage Bowel with mod I Assistance. Outcome: Progressing Goal: RH STG MANAGE BOWEL W/MEDICATION W/ASSISTANCE Description: STG Manage Bowel with Medication with mod I Assistance. Outcome: Progressing   Problem: RH BLADDER ELIMINATION Goal: RH STG MANAGE BLADDER WITH ASSISTANCE Description: STG Manage Bladder With mod I Assistance Outcome: Progressing Goal: RH STG MANAGE BLADDER WITH MEDICATION WITH ASSISTANCE Description: STG Manage Bladder With Medication With Assistance. Outcome: Progressing   Problem: RH SAFETY Goal: RH STG ADHERE TO SAFETY PRECAUTIONS W/ASSISTANCE/DEVICE Description: STG Adhere to Safety Precautions With cues  Assistance/Device. Outcome: Progressing   Problem: RH PAIN MANAGEMENT Goal: RH STG PAIN MANAGED AT OR BELOW PT'S PAIN GOAL Description: Pain < 4 with prns Outcome: Progressing   Problem: RH KNOWLEDGE DEFICIT GENERAL Goal: RH STG INCREASE KNOWLEDGE OF SELF CARE AFTER HOSPITALIZATION Description: Patient and spouse will be able to manage care using educational resources for medications, diet independently Outcome: Progressing   Problem: Education: Goal: Ability to describe self-care measures that may prevent or decrease complications (Diabetes Survival Skills Education) will improve Outcome: Progressing Goal: Individualized Educational Video(s) Outcome: Progressing   Problem: Coping: Goal: Ability to adjust to condition or change in health will improve Outcome: Progressing   Problem: Fluid Volume: Goal: Ability to maintain a balanced intake and output will improve Outcome: Progressing   Problem: Health Behavior/Discharge Planning: Goal: Ability to identify and utilize available resources and services will  improve Outcome: Progressing Goal: Ability to manage health-related needs will improve Outcome: Progressing   Problem: Metabolic: Goal: Ability to maintain appropriate glucose levels will improve Outcome: Progressing   Problem: Nutritional: Goal: Maintenance of adequate nutrition will improve Outcome: Progressing Goal: Progress toward achieving an optimal weight will improve Outcome: Progressing   Problem: Skin Integrity: Goal: Risk for impaired skin integrity will decrease Outcome: Progressing   Problem: Tissue Perfusion: Goal: Adequacy of tissue perfusion will improve Outcome: Progressing   Problem: RH Vision Goal: RH LTG Vision (Specify) Outcome: Progressing

## 2024-05-22 DIAGNOSIS — R29898 Other symptoms and signs involving the musculoskeletal system: Secondary | ICD-10-CM | POA: Diagnosis not present

## 2024-05-22 DIAGNOSIS — I639 Cerebral infarction, unspecified: Secondary | ICD-10-CM | POA: Diagnosis not present

## 2024-05-22 DIAGNOSIS — I71019 Dissection of thoracic aorta, unspecified: Secondary | ICD-10-CM | POA: Diagnosis not present

## 2024-05-22 LAB — GLUCOSE, CAPILLARY
Glucose-Capillary: 105 mg/dL — ABNORMAL HIGH (ref 70–99)
Glucose-Capillary: 116 mg/dL — ABNORMAL HIGH (ref 70–99)
Glucose-Capillary: 118 mg/dL — ABNORMAL HIGH (ref 70–99)
Glucose-Capillary: 103 mg/dL — ABNORMAL HIGH (ref 70–99)

## 2024-05-22 NOTE — Group Note (Signed)
 Patient Details Name: Katai Marsico Bogdan MRN: [id number] DOB: [birth year] Today's Date: 05/22/2024  Time Calculation: OT Group Time Calculation OT Group Start Time: 1100 OT Group Stop Time: 1200 OT Group Time Calculation (min): 60 min      Group Description: BUE Therex Group: Pt participated in group session with a focus on BUE strength and endurance to facilitate improved activity tolerance and strength for higher level BADLs and functional mobility tasks.   Pt engaged in seated therapeutic activity game where pts were instructed to roll large dice to see what number they rolled, once a number was determined each number correlated to an UB exercise and a number of reps, exercises included bicep curls, upright rows, flys, chest presses and punches. Repetitions ranged from 10-20. Pt chose to use 1 lb weights during session. Eduction provided during activity of various modifications for all exercises. Education provided on the importance of deep breathing as well as determining each pts activity tolerance.   Pts guided through below BUE HEP with theraband: Access Code: [email or web address] URL: [email or web address] Date: 05/22/2024 Prepared by:   Exercises - Seated Shoulder Horizontal Abduction with Resistance  - 1 x daily - 7 x weekly - 3 sets - 10 reps - Seated Shoulder Flexion with Self-Anchored Resistance  - 1 x daily - 7 x weekly - 3 sets - 10 reps - Seated Shoulder Diagonal Pulls with Resistance  - 1 x daily - 7 x weekly - 3 sets - 10 reps - Seated Shoulder Extension with Self-Anchored Resistance  - 1 x daily - 7 x weekly - 3 sets - 10 reps - Seated Elbow Flexion with Self-Anchored Resistance  - 1 x daily - 7 x weekly - 3 sets - 10 reps  Ended session with social interaction task where pts were asked various conversational questions when being passed the ball. Pts completed light stretching at end of session to manage pain. Pts transported back by RT.   Pain: Pain Assessment Pain  Scale: 0-10 Pain Score: 0-No pain     Ronal Mallie Needy 05/22/2024, 12:25 PM

## 2024-05-22 NOTE — Progress Notes (Signed)
 Physical Therapy Session Note  Patient Details  Name: [name] [name] MRN: [id number] Date of Birth: [birth year]  Today's Date: 05/22/2024 PT Individual Time: 1303-1356 PT Individual Time Calculation (min): 53 min   Short Term Goals: Week 1:  PT Short Term Goal 1 (Week 1): Pt will be able to perform bed mobility with mod A PT Short Term Goal 2 (Week 1): Pt will be able to perform transfers bed <> w/c with mod A PT Short Term Goal 3 (Week 1): Pt will initiate gait PT Short Term Goal 4 (Week 1): Pt will demonstrate dynamic sitting balance with min A  Skilled Therapeutic Interventions/Progress Updates:    Patient in bathroom on entrance to room with NT. Patient alert and agreeable to PT session.   Patient with no pain complaint at start of session. Took over care of pt and STEDY used to transport pt back to w/c. Requests to brush teeth.   Therapeutic Activity: At sink, pt able to perform brushing of teeth with overall supervision and able to sequence and problem solve all steps. However, does require vc for attn to running water and need to turn off at end of task.   Transfers: Pt performed sit<>stand and stand pivot transfers throughout session with MinA. At times CGA with proper positioning following cues.  Gait Training:  Pt ambulated 7' x2 using RW with CGA/ MinA. Demonstrated flexed knees and posturing  with slow but consistent pace. Does intermittently improve posture without vc and improves L step height/ length with more upright posturing. Cues intermittently for posture and maintaining L knee stability.  Neuromuscular Re-ed: NMR facilitated during session with focus on standing balance. Pt guided in sit<>stand training to balance in stance with no AD except for therapist's arms. Pt guided in proper initial positioning to facilitate forward lean and weight shift using forward push from BUE. Is able to improve to CGA but initially hesitant to use therapists arms.   NMR performed for  improvements in motor control and coordination, balance, sequencing, judgement, and self confidence/ efficacy in performing all aspects of mobility at highest level of independence.   Patient seated upright in w/c at end of session with brakes locked, belt alarm set, and all needs within reach.   Therapy Documentation Precautions:  Precautions Precautions: Fall Recall of Precautions/Restrictions: Impaired Precaution/Restrictions Comments: incision near L clavicle (axillary artery) and bil groins; L weakness Restrictions Weight Bearing Restrictions Per Provider Order: No  Pain:  No pain related during session.   Therapy/Group: Individual Therapy  [name] PT, DPT, CSRS 05/22/2024, 7:18 PM

## 2024-05-22 NOTE — Progress Notes (Signed)
 PROGRESS NOTE   Subjective/Complaints:  No pain c/o, spoke about increasing fluid intake, states his bowels are moving better, 2 BMs yesterday  ROS- denies CP, SOB, N/V/D  Objective:  MRI LUMBAR SPINE WITHOUT CONTRAST   TECHNIQUE: Multiplanar, multisequence MR imaging of the lumbar spine was performed. No intravenous contrast was administered.   COMPARISON:  MRI of the lumbar spine March 06, 2018 and January 23, 2013   FINDINGS: Segmentation: A transitional lumbosacral vertebra is assumed to represent the L5 level. Careful correlation with this numbering strategy prior to any procedural intervention would be recommended.   Alignment:  Physiologic.   Vertebrae: Interbody fusion and posterior left transpedicular fixation is noted at L3-4 with laminectomy at L4-5. Degenerative endplate changes are noted at L3-4. Marrow signal characteristics are otherwise maintained.   Conus medullaris and cauda equina: Conus extends to the T12-L1 level. Conus appear normal. Redundancy of nerve roots at the level of L1 is related to L2-3 spinal canal stenosis.   Paraspinal and other soft tissues: Well-defined oval lesion located to the left of midline in the prevertebral soft tissue at the level of L5-S1 is seen in the sagittal views. The lesion as predominantly hypointense signal on T1 and heterogeneous signal on T2. When compared to prior study, the lesion appears with similar size (3.7 x 2.7 cm) when compared to study performed 2019 but enlarged when compared to MRI performed 2014. T2 signal appear changed when compared to prior study.   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   Right renal cysts.   Disc levels:   T12-L1: No spinal canal or neural foraminal stenosis.   L1-2: No spinal canal or neural foraminal stenosis.   L2-3: Disc bulge with superimposed tiny right central disc protrusion,  facet degenerative changes and ligamentum flavum redundancy resulting in moderate to severe spinal canal stenosis, moderate right and severe left neural foraminal stenosis.   L3-4: Laminectomy with facet fusion. No spinal canal or neural foraminal stenosis.   L4-5: Disc bulge with tiny superimposed central disc protrusion and mild facet degenerative changes result in severe bilateral neural foraminal narrowing. Laminectomy is also noted with no spinal canal stenosis.   L5-S1: No spinal canal or neural foraminal stenosis.   Compared to prior study, there has been significant progression of the degree of spinal canal stenosis at L2-3.   IMPRESSION: 1. 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 This has progressed when compared to prior MRI. 2. Well-defined oval lesion the prevertebral soft tissues to the left of midline at the L5-S1 level, only seen on the sagittal images. This appear with similar size although slightly different T2 signal when compared to study performed 2019 but enlarged when compared to study performed in 2014. 3. 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 Recommend followup by ultrasound in 2 years. This recommendation follows ACR consensus guidelines: White Paper of the ACR Incidental Findings Committee II on Vascular Findings. J Am Coll Radiol 2013; 10:     Electronically Signed   By: Katyucia  Everitt Nile Erichsen M.D.   On: 10/22/2019 14:06 No results found.  Recent Labs    05/21/24 0513  WBC 10.1  HGB 8.9*  HCT 28.1*  PLT 398   Recent Labs    05/21/24 0513  NA 138  K 3.8  CL 105  CO2 21*  GLUCOSE 106*  BUN 27*  CREATININE 1.53*  CALCIUM  8.7*    Intake/Output Summary (Last 24 hours) at 05/22/2024 0731 Last data filed at 05/22/2024 9378 Gross per 24 hour   Intake 780 ml  Output 225 ml  Net 555 ml        Physical Exam: Vital Signs Blood pressure 128/75, pulse 70, temperature 98.1 F (36.7 C), temperature source Oral, resp. rate 17, height 5' 11 (1.803 m), weight 85 kg, SpO2 97%.   General: No acute distress Mood and affect are appropriate Heart: Regular rate and rhythm no rubs murmurs or extra sounds Lungs: Clear to auscultation, breathing unlabored, no rales or wheezes Abdomen: Positive bowel sounds, soft nontender to palpation, nondistended Extremities: No clubbing, cyanosis, or edema Skin: No evidence of breakdown, no evidence of rash Neurologic: Cranial nerves II through XII intact, motor strength is 5/5 in bilateral deltoid, bicep, tricep, grip,3-/5 R RIght and 2-/5 left  hip flexor, 4- Right and 3- left knee extensors, 5/5 RIght and 4/5 left ankle dorsiflexor and plantar flexor Sensory exam normal sensation to light touch and proprioception in bilateral upper , LT normal bilateral L2,3,4,  Musculoskeletal: Full range of motion in all 4 extremities. No joint swelling Oriented to Crist and place but not time, delayed responses   Assessment/Plan: 1. Functional deficits which require 3+ hours per day of interdisciplinary therapy in a comprehensive inpatient rehab setting. Physiatrist is providing close team supervision and 24 hour management of active medical problems listed below. Physiatrist and rehab team continue to assess barriers to discharge/monitor patient progress toward functional and medical goals  Care Tool:  Bathing    Body parts bathed by patient: Right arm, Left arm, Chest, Front perineal area, Abdomen, Right upper leg, Left upper leg, Face   Body parts bathed by helper: Buttocks, Right lower leg, Left lower leg     Bathing assist Assist Level: Moderate Assistance - Patient 50 - 74%     Upper Body Dressing/Undressing Upper body dressing   What is the patient wearing?: Pull over shirt    Upper body  assist Assist Level: Moderate Assistance - Patient 50 - 74%    Lower Body Dressing/Undressing Lower body dressing      What is the patient wearing?: Pants, Incontinence brief     Lower body assist Assist for lower body dressing: Maximal Assistance - Patient 25 - 49%     Toileting Toileting    Toileting assist Assist for toileting: Maximal Assistance - Patient 25 - 49%     Transfers Chair/bed transfer  Transfers assist     Chair/bed transfer assist level: Moderate Assistance - Patient 50 - 74%     Locomotion Ambulation   Ambulation assist   Ambulation activity did not occur: Safety/medical concerns  Assist level: Minimal Assistance - Patient > 75% Assistive device: Walker-rolling Max distance: 10'   Walk 10 feet activity   Assist  Walk 10 feet activity did not occur: Safety/medical concerns  Assist level: Minimal Assistance - Patient > 75% Assistive device: Walker-rolling   Walk 50 feet activity   Assist Walk 50 feet with 2 turns activity did not occur: Safety/medical concerns  Walk 150 feet activity   Assist Walk 150 feet activity did not occur: Safety/medical concerns         Walk 10 feet on uneven surface  activity   Assist Walk 10 feet on uneven surfaces activity did not occur: Safety/medical concerns         Wheelchair     Assist Is the patient using a wheelchair?: Yes Type of Wheelchair: Manual    Wheelchair assist level: Supervision/Verbal cueing Max wheelchair distance: 100'    Wheelchair 50 feet with 2 turns activity    Assist        Assist Level: Supervision/Verbal cueing   Wheelchair 150 feet activity     Assist      Assist Level: Minimal Assistance - Patient > 75%   Blood pressure 128/75, pulse 70, temperature 98.1 F (36.7 C), temperature source Oral, resp. rate 17, height 5' 11 (1.803 m), weight 85 kg, SpO2 97%.  Medical Problem List and Plan: 1. Functional deficits secondary to  right cerebellar small deep infarct             -patient may shower             -ELOS/Goals: 06/09/24 S/MinA  Cont CIR PT, OT, SLP    2.  Antithrombotics: -DVT/anticoagulation:  Pharmaceutical: Lovenox , age indeterminate asymptomatic Left Fem DVT, per Dr Sheree VVS, cont current meds, no activity restriction              -antiplatelet therapy: continue ASA   3. Pain Management: continue Oxycodone  prn for severe and tylenol  prn for mild pain   4. Mood/Behavior/Sleep: LCSW to follow for evaluation and support.              -antipsychotic agents: N/A   5. Neuropsych/cognition: This patient is not capable of making decisions on his own behalf. 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 Skin/Wound Care: Routine pressure relief measures. Monitor incision for healing   7. Fluids/Electrolytes/Nutrition:  Monitor I/O. Check CMET in am             --offer supplement prn   8. T2DM: Monitor BS ac/hs and use SSI for elevated BS             --Was on metform 500 mg bid--held due to dye study, consider resuming once kidney function improves   9.HTN: Monitor BP TID--off home meds at this time. BP reviewed and is starting to increase, will start amlodipine  2.5mg  daily Vitals:   05/21/24 1910 05/22/24 0335  BP: 121/83 128/75  Pulse: 69 70  Resp: 18 17  Temp: 98.2 F (36.8 C) 98.1 F (36.7 C)  SpO2: 99% 97%  Increase amlodipine  to 5mg  daily - controlled 10/3   10. AKI: BUN/SCr 11/1.21 at admission with rise to 19/1.54 post procedure                 Latest Ref Rng & Units 05/21/2024    5:13 AM 05/18/2024    6:17 AM 05/17/2024    7:08 PM  BMP  Glucose 70 - 99 mg/dL 893  890  879   BUN 8 - 23 mg/dL 27  18  16    Creatinine 0.61 - 1.24 mg/dL 8.46  8.61  8.74   Sodium 135 - 145 mmol/L 138  138  139   Potassium 3.5 - 5.1 mmol/L 3.8  4.0  3.9   Chloride 98 - 111 mmol/L 105  109  106   CO2 22 - 32 mmol/L 21  20  21     Calcium  8.9 - 10.3 mg/dL 8.7  8.5  8.6      BUN and Creat creeping up , oral fluid intake is poor , but intake is improving to yesterday  11. Thrombocytopenia: resolved now in high normal range   12.  ABLA: Recheck stable no hematuria, melena noted, cont to monitor at least weekly     Latest Ref Rng & Units 05/21/2024    5:13 AM 05/18/2024    6:17 AM 05/17/2024    7:08 PM  CBC  WBC 4.0 - 10.5 K/uL 10.1  9.8  10.1   Hemoglobin 13.0 - 17.0 g/dL 8.9  8.8  9.2   Hematocrit 39.0 - 52.0 % 28.1  26.8  28.7   Platelets 150 - 400 K/uL 398  326  336    stable   13. Leucocytosis: resolved    14.  H/o dementia: continue Namenda .   15.  Bilateral LE weakness proximally and Left distal LE weakness with hx Lumbar stenosis , suspect progression since prior Lumbar MRI in 2021, will order with contrast since pt has had L3-4 fusion per Dr Beuford in the past  Pt had repeat MRI lumbar showing progressive L2-3 central stenosis , severe bilateral L4-5 foraminal stenosis, intrathecal T1 signal at L5-S1 as well as prevertebral mass at that level will ask Dr Beuford to review MRI Lumbar  Will need f/u with Dr Beuford post discharge  LOS: 5 days A FACE TO FACE EVALUATION WAS PERFORMED  [name] [name] 05/22/2024, 7:31 AM

## 2024-05-22 NOTE — Progress Notes (Signed)
 Physical Therapy Session Note  Patient Details  Name: [name] [name] MRN: [id number] Date of Birth: 07/25/1946  Today's Date: 05/22/2024 PT Individual Time: 0921-1008 PT Individual Time Calculation (min): 47 min   Short Term Goals: Week 1:  PT Short Term Goal 1 (Week 1): Pt will be able to perform bed mobility with mod A PT Short Term Goal 2 (Week 1): Pt will be able to perform transfers bed <> w/c with mod A PT Short Term Goal 3 (Week 1): Pt will initiate gait PT Short Term Goal 4 (Week 1): Pt will demonstrate dynamic sitting balance with min A  Skilled Therapeutic Interventions/Progress Updates:  Patient supine in bed on entrance to room. Patient alert and agreeable to PT session.   Patient with no pain complaint at start of session.  Therapeutic Activity: Bed Mobility: Pt performed supine > sit with MinA for pushing up to seated position from sidelying in bed. VC/ tc required for technique. Transfers: Pt relates need to toilet and willing to make ambulatory transfer to toilet from bed. Requires slight bed elevation and MinA for positioning and performance in power up to RW. CGA with vc for improving posturing and requires CGA/ MinA to complete ambulatory transfer into bathroom. Toilet transfer with MinA to control descent and vc for hand positioning.   Performs dressing while seated on toilet requiring Mod/ MaxA to complete LB and MinA for UB.   Toilets with supervision and continent of b/b. Is able to stand  requiring MinA for power up and stands to RW with CGA. MaxA for pericare. Fatigues and requires seat to toilet but then is incontinent of looser bowel. Requires MinA for power up from toilet and CGA/ MinA to maintain stance during pericare.   Gait Training:  Pt ambulated 15 ft using RW with MinA/ CGA from toilet to sink in order to wash hands and brush teeth. Requires time to complete but is able to sequence and perform correctly with no vc throughout.   Patient seated upright  in w/c at end of session with brakes locked, belt alarm set, and all needs within reach. Priented to time and time of upcoming session with OT for group session in 60 min.   Therapy Documentation Precautions:  Precautions Precautions: Fall Recall of Precautions/Restrictions: Impaired Precaution/Restrictions Comments: incision near L clavicle (axillary artery) and bil groins; L weakness Restrictions Weight Bearing Restrictions Per Provider Order: No  Pain:  No pain related this session.   Therapy/Group: Individual Therapy  [name] PT, DPT, CSRS 05/22/2024, 7:12 PM

## 2024-05-22 NOTE — Progress Notes (Signed)
 Occupational Therapy Session Note  Patient Details  Name: [name] [name] MRN: [id number] Date of Birth: [birth year]  Today's Date: 05/22/2024 OT Individual Time: 1355-1500 OT Individual Time Calculation (min): 65 min    Short Term Goals: Week 1:  OT Short Term Goal 1 (Week 1): Patient to perform toilet transfer with Mod assist of one OT Short Term Goal 2 (Week 1): Patient to perform UB dressing with Min assist OT Short Term Goal 3 (Week 1): Patient to perform grooming with set up seated at the sink  Skilled Therapeutic Interventions/Progress Updates:  Pt greeted seated in w/c, pt agreeable to OT intervention.    Wife present during session.   Transfers/bed mobility/functional mobility:  Pt completed stand pivot transfers with MIN - MODA, assist needed to manage RW and to maintain upright posture as pt tends to lean forward on RW and presents with flexed knees during transfers needing max cues.   Pt completed sit>stands from mat and w/c with MINA. Cues needed each trial for hand placement.   Pt completed simulated toilet transfer task with pt instructed to complete stand pivot from w/c<>EOM with RW and then pull theraband around pts waist below waist level to simulate clothing mgmt. Pt completed task with MIN A- MODA. Pt needed more assist to manage LB clothing d/t BLE weakness and flexed knees.   Pt completed additional simulated toileting task with pt instructed to stand from mat table to remove clothespin on pants to simulate pericare and clothing mgmt. Pt needed MIN - MODA for balance and upright posture.   Exercises: pt completed below seated therex for LB strengthening to facilitate independence with LB ADLS:  -Seated marches with 2lb ankle weights donned  -LAQs with 2lb ankle weights donned  -Seated hip adduction with visual cue provided in between legs with pt instructed to squeeze cone placed in between legs, x20 reps -Seated banded hip abduction with level 1 theraband around pts  thighs x20 reps   Standing marches with 2lb ankle weights with BUE support from Rw, MODA for balance d/t flexed knees,MOD cues for upright posture  Placed RUE on step with pt instructed to sit>stand with weight on LLE for NMRE, pt completed stand with MIN A. Pt then able to elevate RLE off of step x5 with heavy BUE support.     Pt declined walking back to room d/t fatigue.                 Ended session with pt seated in w/c with all needs within reach.  Therapy Documentation Precautions:  Precautions Precautions: Fall Recall of Precautions/Restrictions: Impaired Precaution/Restrictions Comments: incision near L clavicle (axillary artery) and bil groins; L weakness Restrictions Weight Bearing Restrictions Per Provider Order: No  Pain: unrated pain reported in L lower back, rest breaks provided as needed.     Therapy/Group: Individual Therapy  [name] [facility] 05/22/2024, 3:08 PM

## 2024-05-23 DIAGNOSIS — I63541 Cerebral infarction due to unspecified occlusion or stenosis of right cerebellar artery: Secondary | ICD-10-CM | POA: Diagnosis not present

## 2024-05-23 LAB — GLUCOSE, CAPILLARY
Glucose-Capillary: 100 mg/dL — ABNORMAL HIGH (ref 70–99)
Glucose-Capillary: 105 mg/dL — ABNORMAL HIGH (ref 70–99)
Glucose-Capillary: 147 mg/dL — ABNORMAL HIGH (ref 70–99)
Glucose-Capillary: 152 mg/dL — ABNORMAL HIGH (ref 70–99)

## 2024-05-23 NOTE — Plan of Care (Signed)
  Problem: Consults Goal: [facility] GENERAL PATIENT EDUCATION Description: See Patient Education module for education specifics. Outcome: Progressing   

## 2024-05-23 NOTE — Plan of Care (Signed)
  Problem: Consults Goal: RH GENERAL PATIENT EDUCATION Description: See Patient Education module for education specifics. Outcome: Progressing   Problem: RH BOWEL ELIMINATION Goal: RH STG MANAGE BOWEL WITH ASSISTANCE Description: STG Manage Bowel with mod I Assistance. Outcome: Progressing Goal: RH STG MANAGE BOWEL W/MEDICATION W/ASSISTANCE Description: STG Manage Bowel with Medication with mod I Assistance. Outcome: Progressing   Problem: RH BLADDER ELIMINATION Goal: RH STG MANAGE BLADDER WITH ASSISTANCE Description: STG Manage Bladder With mod I Assistance Outcome: Progressing Goal: RH STG MANAGE BLADDER WITH MEDICATION WITH ASSISTANCE Description: STG Manage Bladder With Medication With Assistance. Outcome: Progressing   Problem: RH SAFETY Goal: RH STG ADHERE TO SAFETY PRECAUTIONS W/ASSISTANCE/DEVICE Description: STG Adhere to Safety Precautions With cues  Assistance/Device. Outcome: Progressing   Problem: RH PAIN MANAGEMENT Goal: RH STG PAIN MANAGED AT OR BELOW PT'S PAIN GOAL Description: Pain < 4 with prns Outcome: Progressing   Problem: RH KNOWLEDGE DEFICIT GENERAL Goal: RH STG INCREASE KNOWLEDGE OF SELF CARE AFTER HOSPITALIZATION Description: Patient and spouse will be able to manage care using educational resources for medications, diet independently Outcome: Progressing   Problem: Education: Goal: Ability to describe self-care measures that may prevent or decrease complications (Diabetes Survival Skills Education) will improve Outcome: Progressing Goal: Individualized Educational Video(s) Outcome: Progressing   Problem: Coping: Goal: Ability to adjust to condition or change in health will improve Outcome: Progressing   Problem: Fluid Volume: Goal: Ability to maintain a balanced intake and output will improve Outcome: Progressing   Problem: Health Behavior/Discharge Planning: Goal: Ability to identify and utilize available resources and services will  improve Outcome: Progressing Goal: Ability to manage health-related needs will improve Outcome: Progressing   Problem: Metabolic: Goal: Ability to maintain appropriate glucose levels will improve Outcome: Progressing   Problem: Nutritional: Goal: Maintenance of adequate nutrition will improve Outcome: Progressing Goal: Progress toward achieving an optimal weight will improve Outcome: Progressing   Problem: Skin Integrity: Goal: Risk for impaired skin integrity will decrease Outcome: Progressing   Problem: Tissue Perfusion: Goal: Adequacy of tissue perfusion will improve Outcome: Progressing   Problem: RH Vision Goal: RH LTG Vision (Specify) Outcome: Progressing

## 2024-05-23 NOTE — Progress Notes (Signed)
 PROGRESS NOTE   Subjective/Complaints:  Pt reports LBM 2 days ago- denies constipation.   No pain or HA Slept really well- wants to go back to sleep.   ROS-  Pt denies SOB, abd pain, CP, N/V/C/D, and vision changes   Objective:  MRI LUMBAR SPINE WITHOUT CONTRAST   TECHNIQUE: Multiplanar, multisequence MR imaging of the lumbar spine was performed. No intravenous contrast was administered.   COMPARISON:  MRI of the lumbar spine March 06, 2018 and January 23, 2013   FINDINGS: Segmentation: A transitional lumbosacral vertebra is assumed to represent the L5 level. Careful correlation with this numbering strategy prior to any procedural intervention would be recommended.   Alignment:  Physiologic.   Vertebrae: Interbody fusion and posterior left transpedicular fixation is noted at L3-4 with laminectomy at L4-5. Degenerative endplate changes are noted at L3-4. Marrow signal characteristics are otherwise maintained.   Conus medullaris and cauda equina: Conus extends to the T12-L1 level. Conus appear normal. Redundancy of nerve roots at the level of L1 is related to L2-3 spinal canal stenosis.   Paraspinal and other soft tissues: Well-defined oval lesion located to the left of midline in the prevertebral soft tissue at the level of L5-S1 is seen in the sagittal views. The lesion as predominantly hypointense signal on T1 and heterogeneous signal on T2. When compared to prior study, the lesion appears with similar size (3.7 x 2.7 cm) when compared to study performed 2019 but enlarged when compared to MRI performed 2014. T2 signal appear changed when compared to prior study.   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   Right renal cysts.   Disc levels:   T12-L1: No spinal canal or neural foraminal stenosis.   L1-2: No spinal canal or neural foraminal stenosis.   L2-3: Disc bulge with  superimposed tiny right central disc protrusion, facet degenerative changes and ligamentum flavum redundancy resulting in moderate to severe spinal canal stenosis, moderate right and severe left neural foraminal stenosis.   L3-4: Laminectomy with facet fusion. No spinal canal or neural foraminal stenosis.   L4-5: Disc bulge with tiny superimposed central disc protrusion and mild facet degenerative changes result in severe bilateral neural foraminal narrowing. Laminectomy is also noted with no spinal canal stenosis.   L5-S1: No spinal canal or neural foraminal stenosis.   Compared to prior study, there has been significant progression of the degree of spinal canal stenosis at L2-3.   IMPRESSION: 1. 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 This has progressed when compared to prior MRI. 2. Well-defined oval lesion the prevertebral soft tissues to the left of midline at the L5-S1 level, only seen on the sagittal images. This appear with similar size although slightly different T2 signal when compared to study performed 2019 but enlarged when compared to study performed in 2014. 3. 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 Recommend followup by ultrasound in 2 years. This recommendation follows ACR consensus guidelines: White Paper of the ACR Incidental Findings Committee II on Vascular Findings. J Am  Coll Radiol 2013; 10:     Electronically Signed   By: [name] M.D.   On: 10/22/2019 14:06 No results found.  Recent Labs    05/21/24 0513  WBC 10.1  HGB 8.9*  HCT 28.1*  PLT 398   Recent Labs    05/21/24 0513  NA 138  K 3.8  CL 105  CO2 21*  GLUCOSE 106*  BUN 27*  CREATININE 1.53*  CALCIUM  8.7*    Intake/Output Summary (Last 24 hours) at 05/23/2024 1121 Last  data filed at 05/23/2024 0733 Gross per 24 hour  Intake 729 ml  Output --  Net 729 ml        Physical Exam: Vital Signs Blood pressure 133/86, pulse 61, temperature 98.2 F (36.8 C), temperature source Oral, resp. rate 17, height 5' 11 (1.803 m), weight 85 kg, SpO2 100%.     General: awake, alert, just woke him up; on side in bed;  NAD HENT: conjugate gaze; oropharynx moist CV: regular rate and rhythm; no JVD Pulmonary: CTA B/L; no W/R/R- good air movement GI: soft, NT, ND, (+)BS- slightly hypoactive Psychiatric: appropriate- delayed, flat Neurological: delayed responses/slowed time to respond and speak  Extremities: No clubbing, cyanosis, or edema Skin: No evidence of breakdown, no evidence of rash Neurologic: Cranial nerves II through XII intact, motor strength is 5/5 in bilateral deltoid, bicep, tricep, grip,3-/5 R RIght and 2-/5 left  hip flexor, 4- Right and 3- left knee extensors, 5/5 RIght and 4/5 left ankle dorsiflexor and plantar flexor Sensory exam normal sensation to light touch and proprioception in bilateral upper , LT normal bilateral L2,3,4,  Musculoskeletal: Full range of motion in all 4 extremities. No joint swelling Oriented to [place] and place but not time, delayed responses   Assessment/Plan: 1. Functional deficits which require 3+ hours per day of interdisciplinary therapy in a comprehensive inpatient rehab setting. Physiatrist is providing close team supervision and 24 hour management of active medical problems listed below. Physiatrist and rehab team continue to assess barriers to discharge/monitor patient progress toward functional and medical goals  Care Tool:  Bathing    Body parts bathed by patient: Right arm, Left arm, Chest, Front perineal area, Abdomen, Right upper leg, Left upper leg, Face   Body parts bathed by helper: Buttocks, Right lower leg, Left lower leg     Bathing assist Assist Level: Moderate Assistance - Patient 50 - 74%      Upper Body Dressing/Undressing Upper body dressing   What is the patient wearing?: Pull over shirt    Upper body assist Assist Level: Moderate Assistance - Patient 50 - 74%    Lower Body Dressing/Undressing Lower body dressing      What is the patient wearing?: Pants, Incontinence brief     Lower body assist Assist for lower body dressing: Maximal Assistance - Patient 25 - 49%     Toileting Toileting    Toileting assist Assist for toileting: Maximal Assistance - Patient 25 - 49%     Transfers Chair/bed transfer  Transfers assist     Chair/bed transfer assist level: Moderate Assistance - Patient 50 - 74%     Locomotion Ambulation   Ambulation assist   Ambulation activity did not occur: Safety/medical concerns  Assist level: Minimal Assistance - Patient > 75% Assistive device: Walker-rolling Max distance: 10'   Walk 10 feet activity   Assist  Walk 10 feet activity did not occur: Safety/medical concerns  Assist level: Minimal Assistance - Patient >  75% Assistive device: Walker-rolling   Walk 50 feet activity   Assist Walk 50 feet with 2 turns activity did not occur: Safety/medical concerns         Walk 150 feet activity   Assist Walk 150 feet activity did not occur: Safety/medical concerns         Walk 10 feet on uneven surface  activity   Assist Walk 10 feet on uneven surfaces activity did not occur: Safety/medical concerns         Wheelchair     Assist Is the patient using a wheelchair?: Yes Type of Wheelchair: Manual    Wheelchair assist level: Supervision/Verbal cueing Max wheelchair distance: 100'    Wheelchair 50 feet with 2 turns activity    Assist        Assist Level: Supervision/Verbal cueing   Wheelchair 150 feet activity     Assist      Assist Level: Minimal Assistance - Patient > 75%   Blood pressure 133/86, pulse 61, temperature 98.2 F (36.8 C), temperature source Oral, resp. rate 17,  height 5' 11 (1.803 m), weight 85 kg, SpO2 100%.  Medical Problem List and Plan: 1. 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  Antithrombotics: -DVT/anticoagulation:  Pharmaceutical: Lovenox , age indeterminate asymptomatic Left Fem DVT, per Dr Sheree VVS, cont current meds, no activity restriction              -antiplatelet therapy: continue ASA   3. Pain Management: continue Oxycodone  prn for severe and tylenol  prn for mild pain   10/4- denies pain at all this AM- con't regimen 4. Mood/Behavior/Sleep: LCSW to follow for evaluation and support.              -antipsychotic agents: N/A   5. Neuropsych/cognition: This patient is not capable of making decisions on his own behalf. 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 Skin/Wound Care: Routine pressure relief measures. Monitor incision for healing   7. Fluids/Electrolytes/Nutrition:  Monitor I/O. Check CMET in am             --offer supplement prn   8. T2DM: Monitor BS ac/hs and use SSI for elevated BS             --Was on metform 500 mg bid--held due to dye study, consider resuming once kidney function improves   10/4- BG's running 100-118- con't to monitor 9.HTN: Monitor BP TID--off home meds at this time. BP reviewed and is starting to increase, will start amlodipine  2.5mg  daily Vitals:   05/22/24 1516 05/23/24 0609  BP: 124/88 133/86  Pulse: 78 61  Resp: 18 17  Temp: 97.8 F (36.6 C) 98.2 F (36.8 C)  SpO2: 98% 100%  Increase amlodipine  to 5mg  daily - controlled 10/3   10/4- BP controlled- con't regimen 10. AKI: BUN/SCr 11/1.21 at admission with rise to 19/1.54 post procedure                 Latest Ref Rng & Units 05/21/2024    5:13 AM 05/18/2024    6:17 AM 05/17/2024    7:08 PM  BMP  Glucose 70 - 99 mg/dL 893  890   879   BUN  8 - 23 mg/dL 27  18  16    Creatinine 0.61 - 1.24 mg/dL 8.46  8.61  8.74   Sodium 135 - 145 mmol/L 138  138  139   Potassium 3.5 - 5.1 mmol/L 3.8  4.0  3.9   Chloride 98 - 111 mmol/L 105  109  106   CO2 22 - 32 mmol/L 21  20  21    Calcium  8.9 - 10.3 mg/dL 8.7  8.5  8.6      BUN and Creat creeping up , oral fluid intake is poor , but intake is improving to yesterday  11. Thrombocytopenia: resolved now in high normal range   12.  ABLA: Recheck stable no hematuria, melena noted, cont to monitor at least weekly     Latest Ref Rng & Units 05/21/2024    5:13 AM 05/18/2024    6:17 AM 05/17/2024    7:08 PM  CBC  WBC 4.0 - 10.5 K/uL 10.1  9.8  10.1   Hemoglobin 13.0 - 17.0 g/dL 8.9  8.8  9.2   Hematocrit 39.0 - 52.0 % 28.1  26.8  28.7   Platelets 150 - 400 K/uL 398  326  336    stable   13. Leucocytosis: resolved    14.  H/o dementia: continue Namenda .   15.  Bilateral LE weakness proximally and Left distal LE weakness with hx Lumbar stenosis , suspect progression since prior Lumbar MRI in 2021, will order with contrast since pt has had L3-4 fusion per Dr Beuford in the past  Pt had repeat MRI lumbar showing progressive L2-3 central stenosis , severe bilateral L4-5 foraminal stenosis, intrathecal T1 signal at L5-S1 as well as prevertebral mass at that level will ask Dr Beuford to review MRI Lumbar  Will need f/u with Dr Beuford post discharge    LOS: 6 days A FACE TO FACE EVALUATION WAS PERFORMED  Rhylynn Perdomo 05/23/2024, 11:21 AM

## 2024-05-24 DIAGNOSIS — I63541 Cerebral infarction due to unspecified occlusion or stenosis of right cerebellar artery: Secondary | ICD-10-CM | POA: Diagnosis not present

## 2024-05-24 LAB — GLUCOSE, CAPILLARY
Glucose-Capillary: 102 mg/dL — ABNORMAL HIGH (ref 70–99)
Glucose-Capillary: 112 mg/dL — ABNORMAL HIGH (ref 70–99)
Glucose-Capillary: 119 mg/dL — ABNORMAL HIGH (ref 70–99)
Glucose-Capillary: 115 mg/dL — ABNORMAL HIGH (ref 70–99)

## 2024-05-24 NOTE — Plan of Care (Signed)
  Problem: Consults Goal: RH GENERAL PATIENT EDUCATION Description: See Patient Education module for education specifics. Outcome: Progressing   Problem: RH BOWEL ELIMINATION Goal: RH STG MANAGE BOWEL WITH ASSISTANCE Description: STG Manage Bowel with mod I Assistance. Outcome: Progressing   Problem: RH BOWEL ELIMINATION Goal: RH STG MANAGE BOWEL W/MEDICATION W/ASSISTANCE Description: STG Manage Bowel with Medication with mod I Assistance. Outcome: Progressing

## 2024-05-24 NOTE — Progress Notes (Signed)
 PROGRESS NOTE   Subjective/Complaints:  Pt reports feeling good Seeing your pretty face is enough for today.   LBM yesterday per pt- denies constipation.  Had 3 Bms in last 24 hours- 1 small and 2 medium- type 4's.   ROS-    Pt denies SOB, abd pain, CP, N/V/C/D, and vision changes   Objective:  MRI LUMBAR SPINE WITHOUT CONTRAST   TECHNIQUE: Multiplanar, multisequence MR imaging of the lumbar spine was performed. No intravenous contrast was administered.   COMPARISON:  MRI of the lumbar spine March 06, 2018 and January 23, 2013   FINDINGS: Segmentation: A transitional lumbosacral vertebra is assumed to represent the L5 level. Careful correlation with this numbering strategy prior to any procedural intervention would be recommended.   Alignment:  Physiologic.   Vertebrae: Interbody fusion and posterior left transpedicular fixation is noted at L3-4 with laminectomy at L4-5. Degenerative endplate changes are noted at L3-4. Marrow signal characteristics are otherwise maintained.   Conus medullaris and cauda equina: Conus extends to the T12-L1 level. Conus appear normal. Redundancy of nerve roots at the level of L1 is related to L2-3 spinal canal stenosis.   Paraspinal and other soft tissues: Well-defined oval lesion located to the left of midline in the prevertebral soft tissue at the level of L5-S1 is seen in the sagittal views. The lesion as predominantly hypointense signal on T1 and heterogeneous signal on T2. When compared to prior study, the lesion appears with similar size (3.7 x 2.7 cm) when compared to study performed 2019 but enlarged when compared to MRI performed 2014. T2 signal appear changed when compared to prior study.   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   Right renal cysts.   Disc levels:   T12-L1: No spinal canal or neural foraminal stenosis.   L1-2: No  spinal canal or neural foraminal stenosis.   L2-3: Disc bulge with superimposed tiny right central disc protrusion, facet degenerative changes and ligamentum flavum redundancy resulting in moderate to severe spinal canal stenosis, moderate right and severe left neural foraminal stenosis.   L3-4: Laminectomy with facet fusion. No spinal canal or neural foraminal stenosis.   L4-5: Disc bulge with tiny superimposed central disc protrusion and mild facet degenerative changes result in severe bilateral neural foraminal narrowing. Laminectomy is also noted with no spinal canal stenosis.   L5-S1: No spinal canal or neural foraminal stenosis.   Compared to prior study, there has been significant progression of the degree of spinal canal stenosis at L2-3.   IMPRESSION: 1. 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 This has progressed when compared to prior MRI. 2. Well-defined oval lesion the prevertebral soft tissues to the left of midline at the L5-S1 level, only seen on the sagittal images. This appear with similar size although slightly different T2 signal when compared to study performed 2019 but enlarged when compared to study performed in 2014. 3. 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 Recommend followup by ultrasound in 2 years. This recommendation follows ACR consensus guidelines:  White Paper of the ACR Incidental Findings Committee II on Vascular Findings. J Am Coll Radiol 2013; 10:     Electronically Signed   By: [name] M.D.   On: 10/22/2019 14:06 No results found.  No results for input(s): WBC, HGB, HCT, PLT in the last 72 hours.  No results for input(s): NA, K, CL, CO2, GLUCOSE, BUN, CREATININE, CALCIUM  in the last 72  hours.   Intake/Output Summary (Last 24 hours) at 05/24/2024 1135 Last data filed at 05/24/2024 0825 Gross per 24 hour  Intake 476 ml  Output --  Net 476 ml        Physical Exam: Vital Signs Blood pressure (!) 149/88, pulse 62, temperature 98.7 F (37.1 C), temperature source Oral, resp. rate 18, height 5' 11 (1.803 m), weight 85 kg, SpO2 100%.      General: awake, alert, appropriate, on L side in bed; NAD HENT: conjugate gaze; oropharynx moist CV: regular rate and rhythm; no JVD Pulmonary: CTA B/L; no W/R/R- good air movement GI: soft, NT, ND, (+)BS- mor enormoactive Psychiatric: appropriate Neurological: less delayed this AM- sleepy- watching sunrise  Extremities: No clubbing, cyanosis, or edema Skin: No evidence of breakdown, no evidence of rash Neurologic: Cranial nerves II through XII intact, motor strength is 5/5 in bilateral deltoid, bicep, tricep, grip,3-/5 R RIght and 2-/5 left  hip flexor, 4- Right and 3- left knee extensors, 5/5 RIght and 4/5 left ankle dorsiflexor and plantar flexor Sensory exam normal sensation to light touch and proprioception in bilateral upper , LT normal bilateral L2,3,4,  Musculoskeletal: Full range of motion in all 4 extremities. No joint swelling Oriented to [place] and place but not time, delayed responses   Assessment/Plan: 1. Functional deficits which require 3+ hours per day of interdisciplinary therapy in a comprehensive inpatient rehab setting. Physiatrist is providing close team supervision and 24 hour management of active medical problems listed below. Physiatrist and rehab team continue to assess barriers to discharge/monitor patient progress toward functional and medical goals  Care Tool:  Bathing    Body parts bathed by patient: Right arm, Left arm, Chest, Front perineal area, Abdomen, Right upper leg, Left upper leg, Face   Body parts bathed by helper: Buttocks, Right lower leg, Left lower leg     Bathing assist Assist  Level: Moderate Assistance - Patient 50 - 74%     Upper Body Dressing/Undressing Upper body dressing   What is the patient wearing?: Pull over shirt    Upper body assist Assist Level: Moderate Assistance - Patient 50 - 74%    Lower Body Dressing/Undressing Lower body dressing      What is the patient wearing?: Pants, Incontinence brief     Lower body assist Assist for lower body dressing: Maximal Assistance - Patient 25 - 49%     Toileting Toileting    Toileting assist Assist for toileting: Maximal Assistance - Patient 25 - 49%     Transfers Chair/bed transfer  Transfers assist     Chair/bed transfer assist level: Moderate Assistance - Patient 50 - 74%     Locomotion Ambulation   Ambulation assist   Ambulation activity did not occur: Safety/medical concerns  Assist level: Minimal Assistance - Patient > 75% Assistive device: Walker-rolling Max distance: 10'   Walk 10 feet activity   Assist  Walk 10 feet activity did not occur: Safety/medical concerns  Assist level: Minimal Assistance - Patient > 75% Assistive device: Walker-rolling   Walk 50 feet activity  Assist Walk 50 feet with 2 turns activity did not occur: Safety/medical concerns         Walk 150 feet activity   Assist Walk 150 feet activity did not occur: Safety/medical concerns         Walk 10 feet on uneven surface  activity   Assist Walk 10 feet on uneven surfaces activity did not occur: Safety/medical concerns         Wheelchair     Assist Is the patient using a wheelchair?: Yes Type of Wheelchair: Manual    Wheelchair assist level: Supervision/Verbal cueing Max wheelchair distance: 100'    Wheelchair 50 feet with 2 turns activity    Assist        Assist Level: Supervision/Verbal cueing   Wheelchair 150 feet activity     Assist      Assist Level: Minimal Assistance - Patient > 75%   Blood pressure (!) 149/88, pulse 62, temperature 98.7 F  (37.1 C), temperature source Oral, resp. rate 18, height 5' 11 (1.803 m), weight 85 kg, SpO2 100%.  Medical Problem List and Plan: 1. 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  Antithrombotics: -DVT/anticoagulation:  Pharmaceutical: Lovenox , age indeterminate asymptomatic Left Fem DVT, per Dr Sheree VVS, cont current meds, no activity restriction              -antiplatelet therapy: continue ASA   3. Pain Management: continue Oxycodone  prn for severe and tylenol  prn for mild pain   10/4-10/5- denies pain at all this AM- con't regimen 4. Mood/Behavior/Sleep: LCSW to follow for evaluation and support.              -antipsychotic agents: N/A   5. Neuropsych/cognition: This patient is not capable of making decisions on his own behalf. 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 Skin/Wound Care: Routine pressure relief measures. Monitor incision for healing   7. Fluids/Electrolytes/Nutrition:  Monitor I/O. Check CMET in am             --offer supplement prn   8. T2DM: Monitor BS ac/hs and use SSI for elevated BS             --Was on metform 500 mg bid--held due to dye study, consider resuming once kidney function improves   10/4- BG's running 100-118- con't to monitor 9.HTN: Monitor BP TID--off home meds at this time. BP reviewed and is starting to increase, will start amlodipine  2.5mg  daily Vitals:   05/23/24 2032 05/24/24 0435  BP: 131/77 (!) 149/88  Pulse: 72 62  Resp: 17 18  Temp: 98.7 F (37.1 C) 98.7 F (37.1 C)  SpO2: 100% 100%  Increase amlodipine  to 5mg  daily - controlled 10/3   10/4-10/5 BP controlled- con't regimen  10. AKI: BUN/SCr 11/1.21 at admission with rise to 19/1.54 post procedure                 Latest Ref Rng & Units 05/21/2024    5:13 AM 05/18/2024    6:17  AM 05/17/2024    7:08 PM  BMP  Glucose 70 - 99 mg/dL 893  890  879   BUN 8 - 23 mg/dL 27  18  16  Creatinine 0.61 - 1.24 mg/dL 8.46  8.61  8.74   Sodium 135 - 145 mmol/L 138  138  139   Potassium 3.5 - 5.1 mmol/L 3.8  4.0  3.9   Chloride 98 - 111 mmol/L 105  109  106   CO2 22 - 32 mmol/L 21  20  21    Calcium  8.9 - 10.3 mg/dL 8.7  8.5  8.6      BUN and Creat creeping up , oral fluid intake is poor , but intake is improving to yesterday  11. Thrombocytopenia: resolved now in high normal range   12.  ABLA: Recheck stable no hematuria, melena noted, cont to monitor at least weekly     Latest Ref Rng & Units 05/21/2024    5:13 AM 05/18/2024    6:17 AM 05/17/2024    7:08 PM  CBC  WBC 4.0 - 10.5 K/uL 10.1  9.8  10.1   Hemoglobin 13.0 - 17.0 g/dL 8.9  8.8  9.2   Hematocrit 39.0 - 52.0 % 28.1  26.8  28.7   Platelets 150 - 400 K/uL 398  326  336    stable   13. Leucocytosis: resolved    14.  H/o dementia: continue Namenda .   15.  Bilateral LE weakness proximally and Left distal LE weakness with hx Lumbar stenosis , suspect progression since prior Lumbar MRI in 2021, will order with contrast since pt has had L3-4 fusion per Dr Beuford in the past  Pt had repeat MRI lumbar showing progressive L2-3 central stenosis , severe bilateral L4-5 foraminal stenosis, intrathecal T1 signal at L5-S1 as well as prevertebral mass at that level will ask Dr Beuford to review MRI Lumbar  Will need f/u with Dr Beuford post discharge    LOS: 7 days A FACE TO FACE EVALUATION WAS PERFORMED  Aalaysia Liggins 05/24/2024, 11:35 AM

## 2024-05-24 NOTE — Plan of Care (Signed)
  Problem: Consults Goal: RH GENERAL PATIENT EDUCATION Description: See Patient Education module for education specifics. Outcome: Progressing   Problem: RH BOWEL ELIMINATION Goal: RH STG MANAGE BOWEL WITH ASSISTANCE Description: STG Manage Bowel with mod I Assistance. Outcome: Progressing Goal: RH STG MANAGE BOWEL W/MEDICATION W/ASSISTANCE Description: STG Manage Bowel with Medication with mod I Assistance. Outcome: Progressing   Problem: RH BLADDER ELIMINATION Goal: RH STG MANAGE BLADDER WITH ASSISTANCE Description: STG Manage Bladder With mod I Assistance Outcome: Progressing Goal: RH STG MANAGE BLADDER WITH MEDICATION WITH ASSISTANCE Description: STG Manage Bladder With Medication With Assistance. Outcome: Progressing   Problem: RH SAFETY Goal: RH STG ADHERE TO SAFETY PRECAUTIONS W/ASSISTANCE/DEVICE Description: STG Adhere to Safety Precautions With cues  Assistance/Device. Outcome: Progressing   Problem: RH PAIN MANAGEMENT Goal: RH STG PAIN MANAGED AT OR BELOW PT'S PAIN GOAL Description: Pain < 4 with prns Outcome: Progressing   Problem: RH KNOWLEDGE DEFICIT GENERAL Goal: RH STG INCREASE KNOWLEDGE OF SELF CARE AFTER HOSPITALIZATION Description: Patient and spouse will be able to manage care using educational resources for medications, diet independently Outcome: Progressing   Problem: Education: Goal: Ability to describe self-care measures that may prevent or decrease complications (Diabetes Survival Skills Education) will improve Outcome: Progressing Goal: Individualized Educational Video(s) Outcome: Progressing   Problem: Coping: Goal: Ability to adjust to condition or change in health will improve Outcome: Progressing   Problem: Fluid Volume: Goal: Ability to maintain a balanced intake and output will improve Outcome: Progressing   Problem: Health Behavior/Discharge Planning: Goal: Ability to identify and utilize available resources and services will  improve Outcome: Progressing Goal: Ability to manage health-related needs will improve Outcome: Progressing   Problem: Metabolic: Goal: Ability to maintain appropriate glucose levels will improve Outcome: Progressing   Problem: Nutritional: Goal: Maintenance of adequate nutrition will improve Outcome: Progressing Goal: Progress toward achieving an optimal weight will improve Outcome: Progressing   Problem: Skin Integrity: Goal: Risk for impaired skin integrity will decrease Outcome: Progressing   Problem: Tissue Perfusion: Goal: Adequacy of tissue perfusion will improve Outcome: Progressing   Problem: RH Vision Goal: RH LTG Vision (Specify) Outcome: Progressing

## 2024-05-25 DIAGNOSIS — I71019 Dissection of thoracic aorta, unspecified: Secondary | ICD-10-CM | POA: Diagnosis not present

## 2024-05-25 DIAGNOSIS — I639 Cerebral infarction, unspecified: Secondary | ICD-10-CM | POA: Diagnosis not present

## 2024-05-25 DIAGNOSIS — R29898 Other symptoms and signs involving the musculoskeletal system: Secondary | ICD-10-CM | POA: Diagnosis not present

## 2024-05-25 LAB — URINALYSIS, W/ REFLEX TO CULTURE (INFECTION SUSPECTED)
Bilirubin Urine: NEGATIVE
Glucose, UA: NEGATIVE mg/dL
Ketones, ur: NEGATIVE mg/dL
Leukocytes,Ua: NEGATIVE
Nitrite: NEGATIVE
Protein, ur: 30 mg/dL — AB
RBC / HPF: >50 RBC/hpf (ref 0–5)
Specific Gravity, Urine: 1.02 (ref 1.005–1.030)
pH: 5 (ref 5.0–8.0)

## 2024-05-25 LAB — BASIC METABOLIC PANEL WITH GFR
Anion gap: 8 (ref 5–15)
BUN: 17 mg/dL (ref 8–23)
CO2: 24 mmol/L (ref 22–32)
Calcium: 8.8 mg/dL — ABNORMAL LOW (ref 8.9–10.3)
Chloride: 109 mmol/L (ref 98–111)
Creatinine, Ser: 1.43 mg/dL — ABNORMAL HIGH (ref 0.61–1.24)
GFR, Estimated: 50 mL/min — ABNORMAL LOW (ref >60)
Glucose, Bld: 101 mg/dL — ABNORMAL HIGH (ref 70–99)
Potassium: 4.2 mmol/L (ref 3.5–5.1)
Sodium: 141 mmol/L (ref 135–145)

## 2024-05-25 LAB — CBC
HCT: 28.8 % — ABNORMAL LOW (ref 39.0–52.0)
Hemoglobin: 9.1 g/dL — ABNORMAL LOW (ref 13.0–17.0)
MCH: 28.6 pg (ref 26.0–34.0)
MCHC: 31.6 g/dL (ref 30.0–36.0)
MCV: 90.6 fL (ref 80.0–100.0)
Platelets: 401 K/uL — ABNORMAL HIGH (ref 150–400)
RBC: 3.18 MIL/uL — ABNORMAL LOW (ref 4.22–5.81)
RDW: 14.9 % (ref 11.5–15.5)
WBC: 7 K/uL (ref 4.0–10.5)
nRBC: 0 % (ref 0.0–0.2)

## 2024-05-25 LAB — GLUCOSE, CAPILLARY
Glucose-Capillary: 107 mg/dL — ABNORMAL HIGH (ref 70–99)
Glucose-Capillary: 117 mg/dL — ABNORMAL HIGH (ref 70–99)
Glucose-Capillary: 170 mg/dL — ABNORMAL HIGH (ref 70–99)
Glucose-Capillary: 136 mg/dL — ABNORMAL HIGH (ref 70–99)

## 2024-05-25 NOTE — Progress Notes (Signed)
 Occupational Therapy Session Note  Patient Details  Name: Rhoderick Farrel Ibach MRN: [id number] Date of Birth: [birth year]  Today's Date: 05/25/2024 OT Individual Time: 8950-8798 OT Individual Time Calculation (min): 72 min    Short Term Goals: Week 1:  OT Short Term Goal 1 (Week 1): Patient to perform toilet transfer with Mod assist of one OT Short Term Goal 1 - Progress (Week 1): Met OT Short Term Goal 2 (Week 1): Patient to perform UB dressing with Min assist OT Short Term Goal 2 - Progress (Week 1): Met OT Short Term Goal 3 (Week 1): Patient to perform grooming with set up seated at the sink OT Short Term Goal 3 - Progress (Week 1): Met Week 2:  OT Short Term Goal 1 (Week 2): Pt will complete toileting with MOD A using LRAD OT Short Term Goal 2 (Week 2): Pt will complete toilet transfers with MIN A using LRAD OT Short Term Goal 3 (Week 2): Pt will tolerate standing using LRAD for 2 min during functional activity or ADLs OT Short Term Goal 4 (Week 2): Pt will complete LB dressing with MOD A using LRAD  Skilled Therapeutic Interventions/Progress Updates:    Pt received resting in WC presenting to be in good spirits receptive to skilled OT session reporting 0/10 pain- OT offering intermittent rest breaks, repositioning, and therapeutic support to optimize participation in therapy session. Pt transported to day room via [facility] d/t time management. Engaged Pt in fine motor/cognition pill box activity to address medication management skills by having him simulate placing medications correctly into pillbox according to labels on bottle with SUP and increased time to complete with no errors with frequently reading labels out loud to remember where to put the medications. Stand pivot transfer WC>EOM with MOD A for balance and safety as intervention to simulate toilet transfers. Completed blacked practice of sit<>stand transfers with MIN A x2 to simulate functional transfers used to complete ADLs with education  provided on proper body postioning when standing and MOD tactile cues to facilitate upright posture. Completed functional mobility training with ambulating approximately 40 ft with RW and CGA for safety +2 wheel chair follow for safety with frequent verbal cues to facilitate longer steps, extended legs, gaze, and tactile cues for upright posture when ambulating. Completed functional reach activity to simulate reaching for items during ADLs by reaching and pulling squigz off a mirror and putting them in a container while including reaching at various height levels, posterior reaching, and reaching across body. Pt required MIN A for standing balance and multimodal cues for postural alignment. Provided frequent and extended rest breaks throughout session d/t fatigue. Transported Pt back to room via WC d/t time management. Pt was left resting in WC with call bell in reach, seatbelt alarm on, and all needs met.    Therapy Documentation Precautions:  Precautions Precautions: Fall Recall of Precautions/Restrictions: Impaired Precaution/Restrictions Comments: incision near L clavicle (axillary artery) and bil groins; L weakness Restrictions Weight Bearing Restrictions Per Provider Order: No    Therapy/Group: Individual Therapy  [name] 05/25/2024, 1:01 PM

## 2024-05-25 NOTE — Progress Notes (Signed)
 Occupational Therapy Weekly Progress Note  Patient Details  Name: [name] [name] MRN: [id number] Date of Birth: 1946-05-04  Beginning of progress report period: May 18, 2024 End of progress report period: May 25, 2024  Patient has met 3 of 3 short term goals. [name] [name] is appropriately progressing towards reaching his LTGs. He is completing UB dressing/bathing MIN A, LB dressing MAX A, LB bathing MOD A, toileting MAX A, and stand pivot transfers with MOD A overall using RW. He is planning to d/c home with his wife who can provide 24/7 assistance/supervision and intermittent assistance from his adult DTR. His family is planning to participate in family education prior to d/c.   Patient continues to demonstrate the following deficits: muscle weakness, decreased cardiorespiratoy endurance, unbalanced muscle activation and decreased coordination, decreased visual motor skills, decreased motor planning, decreased attention, decreased awareness, and decreased problem solving, central origin, and decreased standing balance, decreased postural control, and decreased balance strategies and therefore will continue to benefit from skilled OT intervention to enhance overall performance with BADL, iADL, and Reduce care partner burden.  Patient progressing toward long term goals..  Continue plan of care.  OT Short Term Goals Week 1:  OT Short Term Goal 1 (Week 1): Patient to perform toilet transfer with Mod assist of one OT Short Term Goal 1 - Progress (Week 1): Met OT Short Term Goal 2 (Week 1): Patient to perform UB dressing with Min assist OT Short Term Goal 2 - Progress (Week 1): Met OT Short Term Goal 3 (Week 1): Patient to perform grooming with set up seated at the sink OT Short Term Goal 3 - Progress (Week 1): Met Week 2:  OT Short Term Goal 1 (Week 2): Pt will complete toileting with MOD A using LRAD OT Short Term Goal 2 (Week 2): Pt will complete toilet transfers with MIN A using LRAD OT  Short Term Goal 3 (Week 2): Pt will tolerate standing using LRAD for 2 min during functional activity or ADLs OT Short Term Goal 4 (Week 2): Pt will complete LB dressing with MOD A using LRAD  Therapy Documentation Precautions:  Precautions Precautions: Fall Recall of Precautions/Restrictions: Impaired Precaution/Restrictions Comments: incision near L clavicle (axillary artery) and bil groins; L weakness Restrictions Weight Bearing Restrictions Per Provider Order: No   Therapy/Group: Individual Therapy  [name] [name] 05/25/2024, 11:08 AM

## 2024-05-25 NOTE — Plan of Care (Signed)
  Problem: Consults Goal: RH GENERAL PATIENT EDUCATION Description: See Patient Education module for education specifics. Outcome: Progressing   Problem: RH BOWEL ELIMINATION Goal: RH STG MANAGE BOWEL WITH ASSISTANCE Description: STG Manage Bowel with mod I Assistance. Outcome: Progressing Goal: RH STG MANAGE BOWEL W/MEDICATION W/ASSISTANCE Description: STG Manage Bowel with Medication with mod I Assistance. Outcome: Progressing   Problem: RH BLADDER ELIMINATION Goal: RH STG MANAGE BLADDER WITH ASSISTANCE Description: STG Manage Bladder With mod I Assistance Outcome: Progressing Goal: RH STG MANAGE BLADDER WITH MEDICATION WITH ASSISTANCE Description: STG Manage Bladder With Medication With Assistance. Outcome: Progressing   Problem: RH SAFETY Goal: RH STG ADHERE TO SAFETY PRECAUTIONS W/ASSISTANCE/DEVICE Description: STG Adhere to Safety Precautions With cues  Assistance/Device. Outcome: Progressing   Problem: RH PAIN MANAGEMENT Goal: RH STG PAIN MANAGED AT OR BELOW PT'S PAIN GOAL Description: Pain < 4 with prns Outcome: Progressing   Problem: RH KNOWLEDGE DEFICIT GENERAL Goal: RH STG INCREASE KNOWLEDGE OF SELF CARE AFTER HOSPITALIZATION Description: Patient and spouse will be able to manage care using educational resources for medications, diet independently Outcome: Progressing   Problem: Education: Goal: Ability to describe self-care measures that may prevent or decrease complications (Diabetes Survival Skills Education) will improve Outcome: Progressing Goal: Individualized Educational Video(s) Outcome: Progressing   Problem: Coping: Goal: Ability to adjust to condition or change in health will improve Outcome: Progressing   Problem: Fluid Volume: Goal: Ability to maintain a balanced intake and output will improve Outcome: Progressing   Problem: Health Behavior/Discharge Planning: Goal: Ability to identify and utilize available resources and services will  improve Outcome: Progressing Goal: Ability to manage health-related needs will improve Outcome: Progressing   Problem: Metabolic: Goal: Ability to maintain appropriate glucose levels will improve Outcome: Progressing   Problem: Nutritional: Goal: Maintenance of adequate nutrition will improve Outcome: Progressing Goal: Progress toward achieving an optimal weight will improve Outcome: Progressing   Problem: Skin Integrity: Goal: Risk for impaired skin integrity will decrease Outcome: Progressing   Problem: Tissue Perfusion: Goal: Adequacy of tissue perfusion will improve Outcome: Progressing   Problem: RH Vision Goal: RH LTG Vision (Specify) Outcome: Progressing

## 2024-05-25 NOTE — Progress Notes (Signed)
 PROGRESS NOTE   Subjective/Complaints:  No issues overnite , no pains.  In PT still with sitting balance issues needs UE support   ROS-    Pt denies SOB, abd pain, CP, N/V/C/D, and vision changes   Objective:  MRI LUMBAR SPINE WITHOUT CONTRAST   TECHNIQUE: Multiplanar, multisequence MR imaging of the lumbar spine was performed. No intravenous contrast was administered.   COMPARISON:  MRI of the lumbar spine March 06, 2018 and January 23, 2013   FINDINGS: Segmentation: A transitional lumbosacral vertebra is assumed to represent the L5 level. Careful correlation with this numbering strategy prior to any procedural intervention would be recommended.   Alignment:  Physiologic.   Vertebrae: Interbody fusion and posterior left transpedicular fixation is noted at L3-4 with laminectomy at L4-5. Degenerative endplate changes are noted at L3-4. Marrow signal characteristics are otherwise maintained.   Conus medullaris and cauda equina: Conus extends to the T12-L1 level. Conus appear normal. Redundancy of nerve roots at the level of L1 is related to L2-3 spinal canal stenosis.   Paraspinal and other soft tissues: Well-defined oval lesion located to the left of midline in the prevertebral soft tissue at the level of L5-S1 is seen in the sagittal views. The lesion as predominantly hypointense signal on T1 and heterogeneous signal on T2. When compared to prior study, the lesion appears with similar size (3.7 x 2.7 cm) when compared to study performed 2019 but enlarged when compared to MRI performed 2014. T2 signal appear changed when compared to prior study.   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   Right renal cysts.   Disc levels:   T12-L1: No spinal canal or neural foraminal stenosis.   L1-2: No spinal canal or neural foraminal stenosis.   L2-3: Disc bulge with superimposed tiny right  central disc protrusion, facet degenerative changes and ligamentum flavum redundancy resulting in moderate to severe spinal canal stenosis, moderate right and severe left neural foraminal stenosis.   L3-4: Laminectomy with facet fusion. No spinal canal or neural foraminal stenosis.   L4-5: Disc bulge with tiny superimposed central disc protrusion and mild facet degenerative changes result in severe bilateral neural foraminal narrowing. Laminectomy is also noted with no spinal canal stenosis.   L5-S1: No spinal canal or neural foraminal stenosis.   Compared to prior study, there has been significant progression of the degree of spinal canal stenosis at L2-3.   IMPRESSION: 1. 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 This has progressed when compared to prior MRI. 2. Well-defined oval lesion the prevertebral soft tissues to the left of midline at the L5-S1 level, only seen on the sagittal images. This appear with similar size although slightly different T2 signal when compared to study performed 2019 but enlarged when compared to study performed in 2014. 3. 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 Recommend followup by ultrasound in 2 years. This recommendation follows ACR consensus guidelines: White Paper of the ACR Incidental Findings Committee II on Vascular Findings. J Am Coll Radiol 2013; 10:  Electronically Signed   By: [name] M.D.   On: 10/22/2019 14:06 No results found.  Recent Labs    05/25/24 0659  WBC 7.0  HGB 9.1*  HCT 28.8*  PLT 401*    Recent Labs    05/25/24 0659  NA 141  K 4.2  CL 109  CO2 24  GLUCOSE 101*  BUN 17  CREATININE 1.43*  CALCIUM  8.8*     Intake/Output Summary (Last 24 hours) at 05/25/2024 0837 Last data filed at 05/24/2024  1211 Gross per 24 hour  Intake 236 ml  Output --  Net 236 ml        Physical Exam: Vital Signs Blood pressure (!) 151/81, pulse (!) 59, temperature 98 F (36.7 C), resp. rate 18, height 5' 11 (1.803 m), weight 85 kg, SpO2 96%.       General: No acute distress Mood and affect are appropriate Heart: Regular rate and rhythm no rubs murmurs or extra sounds Lungs: Clear to auscultation, breathing unlabored, no rales or wheezes Abdomen: Positive bowel sounds, soft nontender to palpation, nondistended Extremities: No clubbing, cyanosis, or edema Skin: No evidence of breakdown, no evidence of rash  Neurologic: Cranial nerves II through XII intact, motor strength is 5/5 in bilateral deltoid, bicep, tricep, grip,3-/5 R RIght and 2-/5 left  hip flexor, 4- Right and 3- left knee extensors, 5/5 RIght and 4/5 left ankle dorsiflexor and plantar flexor   Musculoskeletal: Full range of motion in all 4 extremities. No joint swelling Oriented to [name] [name] and place but not time, delayed responses   Assessment/Plan: 1. Functional deficits which require 3+ hours per day of interdisciplinary therapy in a comprehensive inpatient rehab setting. Physiatrist is providing close team supervision and 24 hour management of active medical problems listed below. Physiatrist and rehab team continue to assess barriers to discharge/monitor patient progress toward functional and medical goals  Care Tool:  Bathing    Body parts bathed by patient: Right arm, Left arm, Chest, Front perineal area, Abdomen, Right upper leg, Left upper leg, Face   Body parts bathed by helper: Buttocks, Right lower leg, Left lower leg     Bathing assist Assist Level: Moderate Assistance - Patient 50 - 74%     Upper Body Dressing/Undressing Upper body dressing   What is the patient wearing?: Pull over shirt    Upper body assist Assist Level: Moderate Assistance - Patient 50 - 74%    Lower Body Dressing/Undressing Lower  body dressing      What is the patient wearing?: Pants, Incontinence brief     Lower body assist Assist for lower body dressing: Maximal Assistance - Patient 25 - 49%     Toileting Toileting    Toileting assist Assist for toileting: Maximal Assistance - Patient 25 - 49%     Transfers Chair/bed transfer  Transfers assist     Chair/bed transfer assist level: Moderate Assistance - Patient 50 - 74%     Locomotion Ambulation   Ambulation assist   Ambulation activity did not occur: Safety/medical concerns  Assist level: Minimal Assistance - Patient > 75% Assistive device: Walker-rolling Max distance: 10'   Walk 10 feet activity   Assist  Walk 10 feet activity did not occur: Safety/medical concerns  Assist level: Minimal Assistance - Patient > 75% Assistive device: Walker-rolling   Walk 50 feet activity   Assist Walk 50 feet with 2 turns activity did not occur: Safety/medical concerns         Walk  150 feet activity   Assist Walk 150 feet activity did not occur: Safety/medical concerns         Walk 10 feet on uneven surface  activity   Assist Walk 10 feet on uneven surfaces activity did not occur: Safety/medical concerns         Wheelchair     Assist Is the patient using a wheelchair?: Yes Type of Wheelchair: Manual    Wheelchair assist level: Supervision/Verbal cueing Max wheelchair distance: 100'    Wheelchair 50 feet with 2 turns activity    Assist        Assist Level: Supervision/Verbal cueing   Wheelchair 150 feet activity     Assist      Assist Level: Minimal Assistance - Patient > 75%   Blood pressure (!) 151/81, pulse (!) 59, temperature 98 F (36.7 C), resp. rate 18, height 5' 11 (1.803 m), weight 85 kg, SpO2 96%.  Medical Problem List and Plan: 1. 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  Antithrombotics: -DVT/anticoagulation:  Pharmaceutical: Lovenox , age indeterminate asymptomatic Left Fem DVT, per Dr Sheree VVS, cont current meds, no activity restriction              -antiplatelet therapy: continue ASA   3. Pain Management: continue Oxycodone  prn for severe and tylenol  prn for mild pain   10/4-10/5- denies pain at all this AM- con't regimen 4. Mood/Behavior/Sleep: LCSW to follow for evaluation and support.              -antipsychotic agents: N/A   5. Neuropsych/cognition: This patient is not capable of making decisions on his own behalf. 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 Skin/Wound Care: Routine pressure relief measures. Monitor incision for healing   7. Fluids/Electrolytes/Nutrition:  Monitor I/O. Check CMET in am             --offer supplement prn   8. T2DM: Monitor BS ac/hs and use SSI for elevated BS             --Was on metform 500 mg bid--held due to dye study, consider resuming once kidney function improves   10/4- BG's running 100-118- con't to monitor 9.HTN: Monitor BP TID--off home meds at this time. BP reviewed and is starting to increase, will start amlodipine  2.5mg  daily Vitals:   05/24/24 2002 05/25/24 0334  BP: (!) 140/86 (!) 151/81  Pulse: 63 (!) 59  Resp: 18 18  Temp: 98.6 F (37 C) 98 F (36.7 C)  SpO2: 99% 96%  Increase amlodipine  to 5mg  daily - controlled 10/3   10/4-10/5 BP controlled- con't regimen  10. AKI: BUN/SCr 11/1.21 at admission with rise to 19/1.54 post procedure                 Latest Ref Rng & Units 05/25/2024    6:59 AM 05/21/2024    5:13 AM 05/18/2024    6:17 AM  BMP  Glucose 70 - 99 mg/dL 898  893  890   BUN 8 - 23 mg/dL 17  27  18    Creatinine 0.61 - 1.24 mg/dL 8.56  8.46  8.61   Sodium 135 - 145 mmol/L 141  138  138   Potassium 3.5 - 5.1 mmol/L 4.2  3.8  4.0   Chloride 98 - 111 mmol/L 109  105  109   CO2 22 - 32  mmol/L 24  21  20    Calcium  8.9 - 10.3 mg/dL 8.8  8.7  8.5      BUN and Creat creeping up , oral fluid intake is poor , but intake is improving to yesterday  11. Thrombocytopenia: resolved now in high normal range   12.  ABLA: Recheck stable no hematuria, melena noted, cont to monitor at least weekly     Latest Ref Rng & Units 05/25/2024    6:59 AM 05/21/2024    5:13 AM 05/18/2024    6:17 AM  CBC  WBC 4.0 - 10.5 K/uL 7.0  10.1  9.8   Hemoglobin 13.0 - 17.0 g/dL 9.1  8.9  8.8   Hematocrit 39.0 - 52.0 % 28.8  28.1  26.8   Platelets 150 - 400 K/uL 401  398  326    stable   13. Leucocytosis: resolved    14.  H/o dementia: continue Namenda .   15.  Bilateral LE weakness proximally and Left distal LE weakness with hx Lumbar stenosis , suspect progression since prior Lumbar MRI in 2021, will order with contrast since pt has had L3-4 fusion per Dr Beuford in the past  Pt had repeat MRI lumbar showing progressive L2-3 central stenosis , severe bilateral L4-5 foraminal stenosis, intrathecal T1 signal at L5-S1 as well as prevertebral mass at that level will ask Dr Beuford to review MRI Lumbar  Will need f/u with Dr Beuford post discharge    LOS: 8 days A FACE TO FACE EVALUATION WAS PERFORMED  [name] [name] 05/25/2024, 8:37 AM

## 2024-05-25 NOTE — Progress Notes (Signed)
 Physical Therapy Session Note  Patient Details  Name: [name] [name] MRN: [id number] Date of Birth: Oct 04, 1945  Today's Date: 05/25/2024 PT Individual Time: 0805-0902 PT Individual Time Calculation (min): 57 min   Short Term Goals: Week 1:  PT Short Term Goal 1 (Week 1): Pt will be able to perform bed mobility with mod A PT Short Term Goal 2 (Week 1): Pt will be able to perform transfers bed <> w/c with mod A PT Short Term Goal 3 (Week 1): Pt will initiate gait PT Short Term Goal 4 (Week 1): Pt will demonstrate dynamic sitting balance with min A Week 2:     Skilled Therapeutic Interventions/Progress Updates:  Patient *** on entrance to room. Patient alert and agreeable to PT session.   Patient with no pain complaint at start of session.  Therapeutic Activity: Bed Mobility: Pt performed supine <> sit with ***. VC/ tc required for ***. Transfers: Pt performed sit<>stand and stand pivot transfers throughout session with ***. Provided vc/ tc for***.  Gait Training:  Pt ambulated *** ft using *** with ***. Demonstrated ***. Provided vc/ tc for ***.  Neuromuscular Re-ed: NMR facilitated during session with focus on ***. Pt guided in ***. NMR performed for improvements in motor control and coordination, balance, sequencing, judgement, and self confidence/ efficacy in performing all aspects of mobility at highest level of independence.   Patient *** at end of session with brakes locked, *** alarm set, and all needs within reach.  - toileting d/t soiled brief - sit<>stand and balance - ambulation  Therapy Documentation Precautions:  Precautions Precautions: Fall Recall of Precautions/Restrictions: Impaired Precaution/Restrictions Comments: incision near L clavicle (axillary artery) and bil groins; L weakness Restrictions Weight Bearing Restrictions Per Provider Order: No  Pain: Pain Assessment Pain Scale: 0-10 Pain Score: 0-No pain   Therapy/Group: Individual  Therapy  [name] PT, DPT, CSRS 05/25/2024, 5:00 PM

## 2024-05-25 NOTE — Progress Notes (Signed)
 Speech Language Pathology Daily Session Note  Patient Details  Name: [name] [name] MRN: [id number] Date of Birth: 11-21-1945  Today's Date: 05/25/2024 SLP Individual Time: 1349-1445 SLP Individual Time Calculation (min): 56 min  Short Term Goals: Week 1: SLP Short Term Goal 1 (Week 1): Pt will recall recent/relevant info, utilizing memory aids as needed given modA SLP Short Term Goal 2 (Week 1): Pt will sustain attention for ~10 mins w/ modA SLP Short Term Goal 3 (Week 1): Pt will complete verbal organization/thought formulation tasks w/ minA SLP Short Term Goal 4 (Week 1): Pt will solve functional problems w/ modA SLP Short Term Goal 5 (Week 1): Pt will process mildly complex information w/ modA  Skilled Therapeutic Interventions:  Patient was seen in PM to address cognitive re- training. Pt was alert and seated upright in WC upon SLP arrival. Both his wife and another family member present. Pt indep oriented to time and place indep. He demonstrated orientation to situation given min A. Pt demonstrates awareness of both physical and cognitive deficits post CVA given min A. He was challenged to recall events of day specifically participation in therapy sessions where he warranted mod A to recall tasks. SLP introduced WRAP compensatory strategies and examples of utilization. Pt challenged in paragraph retention given a moderate level paragraph. SLP guided pt in use of repetition and association strategies. After a ~4 minute distracted delay, pt recalled information with 60% acc indep improving to 100% given mod A. SLP challenged pt in identifying functional problems through use of pictures cards where pt was challenged to identify problems and solutions. He completed task with mod A. Finally, pt was challenged in sequencing of 4 steps presented visually given a daily task. He completed task with supervision. SLP left pt with memory book in room and provided instruction on how to use it. SLP also  updated pt's therapists to help with filling out memory notebook. Patient was left upright in [facility] with call button within reach, chair alarm active, and wife present. SLP to continue POC.   Pain Pain Assessment Pain Scale: 0-10 Pain Score: 0-No pain  Therapy/Group: Individual Therapy  [name] 05/25/2024, 3:41 PM

## 2024-05-26 LAB — GLUCOSE, CAPILLARY
Glucose-Capillary: 94 mg/dL (ref 70–99)
Glucose-Capillary: 120 mg/dL — ABNORMAL HIGH (ref 70–99)
Glucose-Capillary: 120 mg/dL — ABNORMAL HIGH (ref 70–99)
Glucose-Capillary: 137 mg/dL — ABNORMAL HIGH (ref 70–99)

## 2024-05-26 NOTE — Progress Notes (Signed)
 Occupational Therapy Session Note  Patient Details  Name: [name] [name] MRN: [id number] Date of Birth: 12-Dec-1945  Today's Date: 05/26/2024 OT Individual Time: 8950-8840 OT Individual Time Calculation (min): 70 min    Short Term Goals: Week 1:  OT Short Term Goal 1 (Week 1): Patient to perform toilet transfer with Mod assist of one OT Short Term Goal 1 - Progress (Week 1): Met OT Short Term Goal 2 (Week 1): Patient to perform UB dressing with Min assist OT Short Term Goal 2 - Progress (Week 1): Met OT Short Term Goal 3 (Week 1): Patient to perform grooming with set up seated at the sink OT Short Term Goal 3 - Progress (Week 1): Met  Skilled Therapeutic Interventions/Progress Updates:     Pt received sitting up in wc, dressed and ready for the day upon OT arrival. Pt presenting to be in good spirits receptive to skilled OT session reporting 0/10 pain- OT offering intermittent rest breaks, repositioning, and therapeutic support to optimize participation in therapy session.   Pt requesting to go outside. Transported Pt total A to outdoor location in wc for time management, energy conservation, and to allow for change in environment to support social/emotional well being. While outdoors, engaged Pt in blocked practice of sit<>stands and short distance functional mobility for improved body awareness, strength, and to increase overall confidence in standing position. Pt able to complete total of 3 sit<>stands to RW SBA-CGA with verbal/tactile cues required for postural alignment and B LE engagement. He was fearful following first stand, quickly returning to sitting position, however was able to complete ~10 ft of functional mobility x2 bouts following initial stand with CGA-MIN A required for balance and RW management +consistent multimodal cues required for trunk alignment d/t forward flexed posture. Decreased activity tolerance and postural strength noted.   Pt then urgently requesting to use  restroom so returned to room via wc. Stand pivot wc > elevated toilet seat using RW MIN A for balance. Pt able to doff pants from waist with heavy MIN A required for standing balance. Brief noted to be soiled with Pt unaware. Increased time provided on toilet to allow for BM- documented in flowsheets. Posterior peri-care completed in standing position with heavy MIN A required for standing balance +3 seated rest breaks required d/t fatigue. Brought pants to waist with MIN A for standing balance and completed stand pivot > wc using RW with heavy MIN A +multimodal cues required for weight shifting, RW positioning, and hand positioning when sitting to wc.   Pt requesting to complete grooming/hygiene tasks in seated position 2/2 fatigue. He was able to wash hands, brush teeth, wash face, and groom hair with SUP +increased time- MIN verbal cues required for task initiation. Pt able to manipulate toiletry items without difficulty. Pt was left resting in wc with call bell in reach, seatbelt alarm on, and all needs met.    Therapy Documentation Precautions:  Precautions Precautions: Fall Recall of Precautions/Restrictions: Impaired Precaution/Restrictions Comments: incision near L clavicle (axillary artery) and bil groins; L weakness Restrictions Weight Bearing Restrictions Per Provider Order: No   Therapy/Group: Individual Therapy  Katheryn SHAUNNA [place]ines 05/26/2024, 7:27 AM

## 2024-05-26 NOTE — Progress Notes (Signed)
 Physical Therapy Session Note  Patient Details  Name: [name] [name] MRN: [id number] Date of Birth: 05-06-1946  Today's Date: 05/26/2024 PT Individual Time: 0917-1015 PT Individual Time Calculation (min): 58 min   Short Term Goals: Week 1:  PT Short Term Goal 1 (Week 1): Pt will be able to perform bed mobility with mod A PT Short Term Goal 2 (Week 1): Pt will be able to perform transfers bed <> w/c with mod A PT Short Term Goal 3 (Week 1): Pt will initiate gait PT Short Term Goal 4 (Week 1): Pt will demonstrate dynamic sitting balance with min A  Skilled Therapeutic Interventions/Progress Updates:  Patient supine in bed on entrance to room. Patient alert and agreeable to PT session.   Patient with no pain complaint at start of session.  Therapeutic Activity: Bed Mobility: Pt performed supine <> sit with MinA with vc/ tc for technique. Significant improvement this day with forward lean and maintaining balance while seated EOB. Donned pants with modA, socks and shoes with MaxA.  Transfers: Pt performed sit<>stand and stand pivot transfers throughout session with improving ability to sequence with slightly less cueing required. Completes with MinA initially from EOB. Provided vc/ tc for positioning and elevated bed surface.  Neuromuscular Re-ed: NMR facilitated during session with focus on sit<>stand and standing balance, motor control. Pt guided in sit<>stand training and attaining upright balance in stance to RW initially and then to no AD. Continued to required vc for sequencing forward scoot to EOS, posterior foot positioning, and hip hinge to produce anterior weight shift over toes. Requires ModA initially for power up and improving to MinA with more adequate forward lean. Does demo posterior LOB during power up with attempted rise to stand. Allowed to fail initially in order to improve proprioception and need for more forward weight shift.   NMR performed for improvements in motor control  and coordination, balance, sequencing, judgement, and self confidence/ efficacy in performing all aspects of mobility at highest level of independence.   Gait Training:  Pt ambulated 91 ft using RW with CGA/ MinA and close w/c follow for fatigue. Demonstrated flexed posture with pt able to self correct throughout. Flexing occurs during LLE stance phase and pt takes time to correct upright posture prior to stance phase with RLE.  Minimal cueing for posture but provided with faclitation to glutes and quads during L stance phase.   Patient seated upright in w/c at end of session with brakes locked, belt alarm set, and all needs within reach.   Therapy Documentation Precautions:  Precautions Precautions: Fall Recall of Precautions/Restrictions: Impaired Precaution/Restrictions Comments: incision near L clavicle (axillary artery) and bil groins; L weakness Restrictions Weight Bearing Restrictions Per Provider Order: No  Pain: Pain Assessment Pain Scale: 0-10 Pain Score: 0-No pain   Therapy/Group: Individual Therapy  [name] PT, DPT, CSRS 05/26/2024, 6:36 PM

## 2024-05-26 NOTE — Progress Notes (Signed)
 Speech Language Pathology Weekly Progress and Session Note  Patient Details  Name: [name] [name] MRN: [id number] Date of Birth: 04-07-1946  Beginning of progress report period: September 31, 2025 End of progress report period: May 26, 2024  Today's Date: 05/26/2024 SLP Individual Time: 8651-8552 SLP Individual Time Calculation (min): 59 min  Short Term Goals: Week 1: SLP Short Term Goal 1 (Week 1): Pt will recall recent/relevant info, utilizing memory aids as needed given modA SLP Short Term Goal 1 - Progress (Week 1): Met SLP Short Term Goal 2 (Week 1): Pt will sustain attention for ~10 mins w/ modA SLP Short Term Goal 2 - Progress (Week 1): Met SLP Short Term Goal 3 (Week 1): Pt will complete verbal organization/thought formulation tasks w/ minA SLP Short Term Goal 3 - Progress (Week 1): Progressing toward goal SLP Short Term Goal 4 (Week 1): Pt will solve functional problems w/ modA SLP Short Term Goal 4 - Progress (Week 1): Met SLP Short Term Goal 5 (Week 1): Pt will process mildly complex information w/ modA SLP Short Term Goal 5 - Progress (Week 1): Met    New Short Term Goals: Week 2: SLP Short Term Goal 1 (Week 2): Patient will demonstrate problem solving in mildly complex situations given min multimodal A SLP Short Term Goal 2 (Week 2): Patient will utilize external aids to recall functional daily information with 80% accuracy given min assist. SLP Short Term Goal 3 (Week 2): Pt will process mildly complex information w/ min A SLP Short Term Goal 4 (Week 2): Pt will complete mildly specific word finding tasks w/ minA SLP Short Term Goal 5 (Week 2): Pt will complete verbal organization/thought formulation tasks w/ minA  Weekly Progress Updates: Pt has made steady gains and has met 4 of 5 STG's this reporting period due to improved recall, problem solving, processing, and attention. Currently pt continues to require mod A for recall, attention, thought organization, word  finding and problem solving. Pt/ family education ongoing. Pt would benefit from continued skilled SLP intervention to maximize cognition in order to maximize his functional independence prior to discharge.   Intensity: Minumum of 1-2 x/day, 30 to 90 minutes Frequency: 3 to 5 out of 7 days Duration/Length of Stay: 10/21 Treatment/Interventions: Cognitive remediation/compensation;Functional tasks;Cueing hierarchy;Patient/family education;Internal/external aids;Therapeutic Activities;Therapeutic Exercise;Speech/Language facilitation   Daily Session  Skilled Therapeutic Interventions: Patient was seen in PM to address cognitive re- training. Pt was alert and seated upright in WC upon SLP arrival. He was agreeable for session. Pt recalled participation and tasks completed in PT and OT sessions given min to mod A. SLP reviewed WRAP compensatory strategies and examples of utilization. He was subsequently challenged in a moderate level paragraph retention task. After a 5 minute delay, pt recalled information with 50% acc indp improving to 75% given min A. In other minutes of session, SLP continued to address sequencing through increasing complexity of task. Pt challenged to sequence 5 units of information presented visuallym completing task with min A. SLP further challenged pt in an abstract reasoning/ category exclusion task which he completed with min A. At conclusion of session, pt was left upright in [facility] with call button within reach, chair alarm active, and wife present. SLP to continue POC,     General    Pain Pain Assessment Pain Scale: 0-10 Pain Score: 0-No pain  Therapy/Group: Individual Therapy  [name] 05/26/2024, 4:13 PM

## 2024-05-26 NOTE — Progress Notes (Signed)
 PROGRESS NOTE   Subjective/Complaints:    ROS-    Pt denies SOB, abd pain, CP, N/V/C/D, and vision changes   Objective:  MRI LUMBAR SPINE WITHOUT CONTRAST   TECHNIQUE: Multiplanar, multisequence MR imaging of the lumbar spine was performed. No intravenous contrast was administered.   COMPARISON:  MRI of the lumbar spine March 06, 2018 and January 23, 2013   FINDINGS: Segmentation: A transitional lumbosacral vertebra is assumed to represent the L5 level. Careful correlation with this numbering strategy prior to any procedural intervention would be recommended.   Alignment:  Physiologic.   Vertebrae: Interbody fusion and posterior left transpedicular fixation is noted at L3-4 with laminectomy at L4-5. Degenerative endplate changes are noted at L3-4. Marrow signal characteristics are otherwise maintained.   Conus medullaris and cauda equina: Conus extends to the T12-L1 level. Conus appear normal. Redundancy of nerve roots at the level of L1 is related to L2-3 spinal canal stenosis.   Paraspinal and other soft tissues: Well-defined oval lesion located to the left of midline in the prevertebral soft tissue at the level of L5-S1 is seen in the sagittal views. The lesion as predominantly hypointense signal on T1 and heterogeneous signal on T2. When compared to prior study, the lesion appears with similar size (3.7 x 2.7 cm) when compared to study performed 2019 but enlarged when compared to MRI performed 2014. T2 signal appear changed when compared to prior study.   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   Right renal cysts.   Disc levels:   T12-L1: No spinal canal or neural foraminal stenosis.   L1-2: No spinal canal or neural foraminal stenosis.   L2-3: Disc bulge with superimposed tiny right central disc protrusion, facet degenerative changes and ligamentum flavum redundancy  resulting in moderate to severe spinal canal stenosis, moderate right and severe left neural foraminal stenosis.   L3-4: Laminectomy with facet fusion. No spinal canal or neural foraminal stenosis.   L4-5: Disc bulge with tiny superimposed central disc protrusion and mild facet degenerative changes result in severe bilateral neural foraminal narrowing. Laminectomy is also noted with no spinal canal stenosis.   L5-S1: No spinal canal or neural foraminal stenosis.   Compared to prior study, there has been significant progression of the degree of spinal canal stenosis at L2-3.   IMPRESSION: 1. 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 This has progressed when compared to prior MRI. 2. Well-defined oval lesion the prevertebral soft tissues to the left of midline at the L5-S1 level, only seen on the sagittal images. This appear with similar size although slightly different T2 signal when compared to study performed 2019 but enlarged when compared to study performed in 2014. 3. 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 Recommend followup by ultrasound in 2 years. This recommendation follows ACR consensus guidelines: White Paper of the ACR Incidental Findings Committee II on Vascular Findings. J Am Coll Radiol 2013; 10:     Electronically Signed   By: [name] M.D.  On: 10/22/2019 14:06 No results found.  Recent Labs    05/25/24 0659  WBC 7.0  HGB 9.1*  HCT 28.8*  PLT 401*    Recent Labs    05/25/24 0659  NA 141  K 4.2  CL 109  CO2 24  GLUCOSE 101*  BUN 17  CREATININE 1.43*  CALCIUM  8.8*     Intake/Output Summary (Last 24 hours) at 05/26/2024 0757 Last data filed at 05/25/2024 2200 Gross per 24 hour  Intake 236 ml  Output 50 ml  Net 186 ml        Physical  Exam: Vital Signs Blood pressure (!) 149/87, pulse 62, temperature 97.8 F (36.6 C), resp. rate 19, height 5' 11 (1.803 m), weight 85 kg, SpO2 100%.       General: No acute distress Mood and affect are appropriate Heart: Regular rate and rhythm no rubs murmurs or extra sounds Lungs: Clear to auscultation, breathing unlabored, no rales or wheezes Abdomen: Positive bowel sounds, soft nontender to palpation, nondistended Extremities: No clubbing, cyanosis, or edema Skin: No evidence of breakdown, no evidence of rash  Neurologic: Cranial nerves II through XII intact, motor strength is 5/5 in bilateral deltoid, bicep, tricep, grip,3-/5 R RIght and 2-/5 left  hip flexor, 4- Right and 3- left knee extensors, 5/5 RIght and 4/5 left ankle dorsiflexor and plantar flexor   Musculoskeletal: Full range of motion in all 4 extremities. No joint swelling Oriented to Berggren and place but not time, delayed responses   Assessment/Plan: 1. Functional deficits which require 3+ hours per day of interdisciplinary therapy in a comprehensive inpatient rehab setting. Physiatrist is providing close team supervision and 24 hour management of active medical problems listed below. Physiatrist and rehab team continue to assess barriers to discharge/monitor patient progress toward functional and medical goals  Care Tool:  Bathing    Body parts bathed by patient: Right arm, Left arm, Chest, Front perineal area, Abdomen, Right upper leg, Left upper leg, Face   Body parts bathed by helper: Buttocks, Right lower leg, Left lower leg     Bathing assist Assist Level: Moderate Assistance - Patient 50 - 74%     Upper Body Dressing/Undressing Upper body dressing   What is the patient wearing?: Pull over shirt    Upper body assist Assist Level: Minimal Assistance - Patient > 75%    Lower Body Dressing/Undressing Lower body dressing      What is the patient wearing?: Pants     Lower body assist Assist  for lower body dressing: Maximal Assistance - Patient 25 - 49%     Toileting Toileting    Toileting assist Assist for toileting: Maximal Assistance - Patient 25 - 49%     Transfers Chair/bed transfer  Transfers assist     Chair/bed transfer assist level: Moderate Assistance - Patient 50 - 74%     Locomotion Ambulation   Ambulation assist   Ambulation activity did not occur: Safety/medical concerns  Assist level: Minimal Assistance - Patient > 75% Assistive device: Walker-rolling Max distance: 10'   Walk 10 feet activity   Assist  Walk 10 feet activity did not occur: Safety/medical concerns  Assist level: Minimal Assistance - Patient > 75% Assistive device: Walker-rolling   Walk 50 feet activity   Assist Walk 50 feet with 2 turns activity did not occur: Safety/medical concerns         Walk 150 feet activity   Assist Walk 150 feet activity did not occur: Safety/medical concerns  Walk 10 feet on uneven surface  activity   Assist Walk 10 feet on uneven surfaces activity did not occur: Safety/medical concerns         Wheelchair     Assist Is the patient using a wheelchair?: Yes Type of Wheelchair: Manual    Wheelchair assist level: Supervision/Verbal cueing Max wheelchair distance: 100'    Wheelchair 50 feet with 2 turns activity    Assist        Assist Level: Supervision/Verbal cueing   Wheelchair 150 feet activity     Assist      Assist Level: Minimal Assistance - Patient > 75%   Blood pressure (!) 149/87, pulse 62, temperature 97.8 F (36.6 C), resp. rate 19, height 5' 11 (1.803 m), weight 85 kg, SpO2 100%.  Medical Problem List and Plan: 1. 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  Antithrombotics: -DVT/anticoagulation:  Pharmaceutical: Lovenox , age indeterminate  asymptomatic Left Fem DVT, per Dr Sheree VVS, cont current meds, no activity restriction              -antiplatelet therapy: continue ASA   3. Pain Management: continue Oxycodone  prn for severe and tylenol  prn for mild pain   10/4-10/5- denies pain at all this AM- con't regimen 4. Mood/Behavior/Sleep: LCSW to follow for evaluation and support.              -antipsychotic agents: N/A   5. Neuropsych/cognition: This patient is not capable of making decisions on his own behalf. 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 Skin/Wound Care: Routine pressure relief measures. Monitor incision for healing   7. Fluids/Electrolytes/Nutrition:  Monitor I/O. Check CMET in am             --offer supplement prn   8. T2DM: Monitor BS ac/hs and use SSI for elevated BS             --Was on metform 500 mg bid--held due to dye study, consider resuming once kidney function improves   10/4- BG's running 100-118- con't to monitor 9.HTN: Monitor BP TID--off home meds at this time. BP reviewed and is starting to increase, will start amlodipine  2.5mg  daily Vitals:   05/25/24 1958 05/26/24 0457  BP: (!) 142/79 (!) 149/87  Pulse: 68 62  Resp: 18 19  Temp: 98.5 F (36.9 C) 97.8 F (36.6 C)  SpO2: 98% 100%  Increase amlodipine  to 5mg  daily - sys elevation mild    10. AKI: BUN/SCr 11/1.21 at admission with rise to 19/1.54 post procedure                 Latest Ref Rng & Units 05/25/2024    6:59 AM 05/21/2024    5:13 AM 05/18/2024    6:17 AM  BMP  Glucose 70 - 99 mg/dL 898  893  890   BUN 8 - 23 mg/dL 17  27  18    Creatinine 0.61 - 1.24 mg/dL 8.56  8.46  8.61   Sodium 135 - 145 mmol/L 141  138  138   Potassium 3.5 - 5.1 mmol/L 4.2  3.8  4.0   Chloride 98 - 111 mmol/L 109  105  109   CO2 22 -  32 mmol/L 24  21  20    Calcium  8.9 - 10.3 mg/dL 8.8  8.7  8.5      BUN and Creat creeping up , oral fluid intake is poor , but intake is  improving to yesterday  11. Thrombocytopenia: resolved now in high normal range   12.  ABLA: Recheck stable no hematuria, melena noted, cont to monitor at least weekly     Latest Ref Rng & Units 05/25/2024    6:59 AM 05/21/2024    5:13 AM 05/18/2024    6:17 AM  CBC  WBC 4.0 - 10.5 K/uL 7.0  10.1  9.8   Hemoglobin 13.0 - 17.0 g/dL 9.1  8.9  8.8   Hematocrit 39.0 - 52.0 % 28.8  28.1  26.8   Platelets 150 - 400 K/uL 401  398  326    stable   13. Leucocytosis: resolved    14.  H/o dementia vascular: continue Namenda .  Has been seeing neuro as outpt   15.  Bilateral LE weakness proximally and Left distal LE weakness with hx Lumbar stenosis , suspect progression since prior Lumbar MRI in 2021, pt has had L3-4 fusion per Dr Beuford in 2021- some improvement in LE strength , pt feels decline in strength was post op after aneurysm repair, however pt has hx of dementia and is poor historian, had Hospital admit July 2025 and was reportedly using walker at baseline and went to SNF.  Using walker at OV with VVS 04/08/2024 Pt had repeat MRI lumbar showing progressive L2-3 central stenosis , severe bilateral L4-5 foraminal stenosis, intrathecal T1 signal at L5-S1 Discussed case with Dr Beuford.  Will need f/u with Dr Beuford post discharge    LOS: 9 days A FACE TO FACE EVALUATION WAS PERFORMED  [name] [name] 05/26/2024, 7:57 AM

## 2024-05-26 NOTE — Plan of Care (Signed)
  Problem: Consults Goal: RH GENERAL PATIENT EDUCATION Description: See Patient Education module for education specifics. Outcome: Progressing   Problem: RH BOWEL ELIMINATION Goal: RH STG MANAGE BOWEL WITH ASSISTANCE Description: STG Manage Bowel with mod I Assistance. Outcome: Progressing Goal: RH STG MANAGE BOWEL W/MEDICATION W/ASSISTANCE Description: STG Manage Bowel with Medication with mod I Assistance. Outcome: Progressing   Problem: RH BLADDER ELIMINATION Goal: RH STG MANAGE BLADDER WITH ASSISTANCE Description: STG Manage Bladder With mod I Assistance Outcome: Progressing Goal: RH STG MANAGE BLADDER WITH MEDICATION WITH ASSISTANCE Description: STG Manage Bladder With Medication With Assistance. Outcome: Progressing   Problem: RH SAFETY Goal: RH STG ADHERE TO SAFETY PRECAUTIONS W/ASSISTANCE/DEVICE Description: STG Adhere to Safety Precautions With cues  Assistance/Device. Outcome: Progressing   Problem: RH PAIN MANAGEMENT Goal: RH STG PAIN MANAGED AT OR BELOW PT'S PAIN GOAL Description: Pain < 4 with prns Outcome: Progressing   Problem: RH KNOWLEDGE DEFICIT GENERAL Goal: RH STG INCREASE KNOWLEDGE OF SELF CARE AFTER HOSPITALIZATION Description: Patient and spouse will be able to manage care using educational resources for medications, diet independently Outcome: Progressing   Problem: Education: Goal: Ability to describe self-care measures that may prevent or decrease complications (Diabetes Survival Skills Education) will improve Outcome: Progressing Goal: Individualized Educational Video(s) Outcome: Progressing   Problem: Coping: Goal: Ability to adjust to condition or change in health will improve Outcome: Progressing   Problem: Fluid Volume: Goal: Ability to maintain a balanced intake and output will improve Outcome: Progressing   Problem: Health Behavior/Discharge Planning: Goal: Ability to identify and utilize available resources and services will  improve Outcome: Progressing Goal: Ability to manage health-related needs will improve Outcome: Progressing   Problem: Metabolic: Goal: Ability to maintain appropriate glucose levels will improve Outcome: Progressing   Problem: Nutritional: Goal: Maintenance of adequate nutrition will improve Outcome: Progressing Goal: Progress toward achieving an optimal weight will improve Outcome: Progressing   Problem: Skin Integrity: Goal: Risk for impaired skin integrity will decrease Outcome: Progressing   Problem: Tissue Perfusion: Goal: Adequacy of tissue perfusion will improve Outcome: Progressing   Problem: RH Vision Goal: RH LTG Vision (Specify) Outcome: Progressing

## 2024-05-26 NOTE — Progress Notes (Signed)
 Physical Therapy Weekly Progress Note  Patient Details  Name: [name] [name] MRN: [id number] Date of Birth: [birth year]  Beginning of progress report period: {Time; dates multiple:304500300} End of progress report period: {Time; dates multiple:304500300}  Today's Date: 05/26/2024  Patient has met {number 1-5:22450} of {number 1-5:20334} short term goals.  ***  Patient continues to demonstrate the following deficits {impairments:3041632} and therefore will continue to benefit from skilled PT intervention to increase functional independence with mobility.  Patient {LTG progression:3041653}.  {plan of rjmz:6958345}  PT Short Term Goals {DUH:6958314}  Skilled Therapeutic Interventions/Progress Updates:      Therapy Documentation Precautions:  Precautions Precautions: Fall Recall of Precautions/Restrictions: Impaired Precaution/Restrictions Comments: incision near L clavicle (axillary artery) and bil groins; L weakness Restrictions Weight Bearing Restrictions Per Provider Order: No  Pain: Pain Assessment Pain Scale: 0-10 Pain Score: 0-No pain    Therapy/Group: Individual Therapy  [name].PT, DPT, CSRS 05/26/2024, 6:37 PM

## 2024-05-27 LAB — BASIC METABOLIC PANEL WITH GFR
Anion gap: 14 (ref 5–15)
BUN: 24 mg/dL — ABNORMAL HIGH (ref 8–23)
CO2: 19 mmol/L — ABNORMAL LOW (ref 22–32)
Calcium: 9.3 mg/dL (ref 8.9–10.3)
Chloride: 105 mmol/L (ref 98–111)
Creatinine, Ser: 1.75 mg/dL — ABNORMAL HIGH (ref 0.61–1.24)
GFR, Estimated: 39 mL/min — ABNORMAL LOW (ref >60)
Glucose, Bld: 148 mg/dL — ABNORMAL HIGH (ref 70–99)
Potassium: 4 mmol/L (ref 3.5–5.1)
Sodium: 138 mmol/L (ref 135–145)

## 2024-05-27 LAB — CBC WITH DIFFERENTIAL/PLATELET
Abs Immature Granulocytes: 0.18 K/uL — ABNORMAL HIGH (ref 0.00–0.07)
Basophils Absolute: 0.1 K/uL (ref 0.0–0.1)
Basophils Relative: 0 %
Eosinophils Absolute: 0.1 K/uL (ref 0.0–0.5)
Eosinophils Relative: 0 %
HCT: 32.5 % — ABNORMAL LOW (ref 39.0–52.0)
Hemoglobin: 10.1 g/dL — ABNORMAL LOW (ref 13.0–17.0)
Immature Granulocytes: 1 %
Lymphocytes Relative: 6 %
Lymphs Abs: 1.2 K/uL (ref 0.7–4.0)
MCH: 28.1 pg (ref 26.0–34.0)
MCHC: 31.1 g/dL (ref 30.0–36.0)
MCV: 90.3 fL (ref 80.0–100.0)
Monocytes Absolute: 1.5 K/uL — ABNORMAL HIGH (ref 0.1–1.0)
Monocytes Relative: 8 %
Neutro Abs: 17.2 K/uL — ABNORMAL HIGH (ref 1.7–7.7)
Neutrophils Relative %: 85 %
Platelets: 429 K/uL — ABNORMAL HIGH (ref 150–400)
RBC: 3.6 MIL/uL — ABNORMAL LOW (ref 4.22–5.81)
RDW: 15.3 % (ref 11.5–15.5)
WBC: 20.4 K/uL — ABNORMAL HIGH (ref 4.0–10.5)
nRBC: 0 % (ref 0.0–0.2)

## 2024-05-27 LAB — GLUCOSE, CAPILLARY
Glucose-Capillary: 126 mg/dL — ABNORMAL HIGH (ref 70–99)
Glucose-Capillary: 142 mg/dL — ABNORMAL HIGH (ref 70–99)
Glucose-Capillary: 131 mg/dL — ABNORMAL HIGH (ref 70–99)
Glucose-Capillary: 120 mg/dL — ABNORMAL HIGH (ref 70–99)

## 2024-05-27 MED ORDER — SODIUM CHLORIDE 0.9 % IV SOLN: INTRAVENOUS | Status: DC

## 2024-05-27 MED ORDER — ENSURE MAX PROTEIN PO LIQD
11.0000 [oz_av] | Freq: Three times a day (TID) | ORAL | Status: DC
  Administered 2024-05-27 – 2024-05-29 (×4): 11 [oz_av] via ORAL

## 2024-05-27 MED ORDER — TAMSULOSIN HCL 0.4 MG PO CAPS
0.4000 mg | ORAL_CAPSULE | Freq: Every day | ORAL | Status: DC
  Administered 2024-05-28 – 2024-06-08 (×12): 0.4 mg via ORAL
  Filled 2024-05-27 (×12): qty 1

## 2024-05-27 MED ORDER — AMLODIPINE BESYLATE 10 MG PO TABS
10.0000 mg | ORAL_TABLET | Freq: Every day | ORAL | Status: DC
  Administered 2024-05-28 – 2024-06-08 (×12): 10 mg via ORAL
  Filled 2024-05-27 (×12): qty 1

## 2024-05-27 MED ORDER — AMLODIPINE BESYLATE 10 MG PO TABS
10.0000 mg | ORAL_TABLET | Freq: Every day | ORAL | Status: DC
  Administered 2024-05-27: 10 mg via ORAL
  Filled 2024-05-27 (×2): qty 1

## 2024-05-27 NOTE — Progress Notes (Signed)
 Patient in bed--awake, alert, able to answer orientation questions, recall date as well as follow simple motor commands. Speech clear and no delay in processing. He was unable to recall being tired or having difficulty with therapy today. Ate 10% of meal and intake has been poor. Labs ordered-_SCr trending up and likely dehydrated. Orthostatic vitals ordered--amlodipine  increased today. Ensure TID added for nutritional support.

## 2024-05-27 NOTE — Progress Notes (Signed)
 Speech Language Pathology Daily Session Note  Patient Details  Name: [name] [name] MRN: [id number] Date of Birth: [birth year]  Today's Date: 05/27/2024 SLP Individual Time: 8494-8465 SLP Individual Time Calculation (min): 29 min  Short Term Goals: Week 2: SLP Short Term Goal 1 (Week 2): Patient will demonstrate problem solving in mildly complex situations given min multimodal A SLP Short Term Goal 2 (Week 2): Patient will utilize external aids to recall functional daily information with 80% accuracy given min assist. SLP Short Term Goal 3 (Week 2): Pt will process mildly complex information w/ min A SLP Short Term Goal 4 (Week 2): Pt will complete mildly specific word finding tasks w/ minA SLP Short Term Goal 5 (Week 2): Pt will complete verbal organization/thought formulation tasks w/ minA  Skilled Therapeutic Interventions:   Pt and his wife greeted at bedside. He was up in his WC after PT tx session. He was visibly fatigued, but agreeable to tx tasks targeting cognition. He was able to recall today's date independently. He was then challenged to a mildly complex responsive naming task targeting information processing and word finding. He benefited from modA overall. Additional processing time required throughout - anticipate fatigue negatively impacted success. At the end of tx tasks, he requested to remain up in his [facility] and was left w/ the alarm set and call light within reach. Pt's wife present as well. Recommend cont ST per POC.   Pain  None   Therapy/Group: Individual Therapy  [name] [name] 05/27/2024, 5:01 PM

## 2024-05-27 NOTE — Progress Notes (Signed)
 Occupational Therapy Session Note  Patient Details  Name: [name] [name] MRN: [id number] Date of Birth: June 14, 1946  Today's Date: 05/27/2024 OT Individual Time: [phone] OT Individual Time Calculation (min): 71 min    Short Term Goals: Week 2:  OT Short Term Goal 1 (Week 2): Pt will complete toileting with MOD A using LRAD OT Short Term Goal 2 (Week 2): Pt will complete toilet transfers with MIN A using LRAD OT Short Term Goal 3 (Week 2): Pt will tolerate standing using LRAD for 2 min during functional activity or ADLs OT Short Term Goal 4 (Week 2): Pt will complete LB dressing with MOD A using LRAD  Skilled Therapeutic Interventions/Progress Updates:  Pt greeted supine in bed, pt agreeable to OT intervention and initially states that he wants to shower.  Transfers/bed mobility/functional mobility:  Pt needed MAX A for bed mobility needing assist to fully maneuver BLES to EOB and MAX A to elevate trunk to sitting. Pt presents with posterior lean from EOB d/t back pain. Set up stedy for shower transfer for time mgmt however once EOB with stedy pt then declines shower d/t pain. Pt able to stand to stedy MODA, dependent transfer into w/c in stedy.   ADLs:  Grooming: pt completed seated oral care with set-up assist.  UB dressing:pt donned [place] shirt with set- up assist.  LB dressing: MAX A to don pants from w/c, education provided on use of reacher d/t back pain however pt needed max multimodal cues to sequence steps.  Footwear: education provided on use of sock aid d/t back pain, pt required max multimodal cues  Bathing: pt completed wash up at sink with MODA, pt able to wash UB and upper legs but needed assist for lower legs and back.    Self feeding:  pt reports exhaustion. Needing total A to set-up breakfast.    Cognition:              Pt disoriented x4 today, pt able to state one task completed during session to write in memory book. Overall, pt slow to initiate and seemed to have  increased delayed processing today. Asked multiple times if pt was okay as pt would stop intermittently. Pt would state, I'm not sure.   Ended session with pt seated in w/c with all needs within reach and safety belt alarm activated.                    Therapy Documentation Precautions:  Precautions Precautions: Fall Recall of Precautions/Restrictions: Impaired Precaution/Restrictions Comments: incision near L clavicle (axillary artery) and bil groins; L weakness Restrictions Weight Bearing Restrictions Per Provider Order: No  Pain: Unrated back pain reported, rest breaks provided, pain meds given.    Therapy/Group: Individual Therapy  [name] [facility] 05/27/2024, 12:06 PM

## 2024-05-27 NOTE — Discharge Summary (Signed)
 Physician Discharge Summary  Patient ID: [name] [name] MRN: [id number] DOB/AGE: 79-23-1947 79 y.o.  Admit date: 05/17/2024 Discharge date: 06/09/2024  Discharge Diagnoses:  Principal Problem:   Cerebral infarction involving right cerebellar artery [facility]) Active Problems:   History of repair of dissecting thoracic aneurysm   DM (diabetes mellitus) (HCC)   CKD (chronic kidney disease), stage III (HCC)   Thrombocytopenia   UTI (urinary tract infection)   Gait difficulty   Stroke (cerebrum) (HCC)   Discharged Condition: stable  Significant Diagnostic Studies: MR Lumbar Spine W Wo Contrast Result Date: 05/20/2024 CLINICAL DATA:  Initial evaluation for lumbar radiculopathy, new symptoms. No other relevant history provided. EXAM: MRI LUMBAR SPINE WITHOUT AND WITH CONTRAST TECHNIQUE: Multiplanar and multiecho pulse sequences of the lumbar spine were obtained without and with intravenous contrast. CONTRAST:  8mL GADAVIST  GADOBUTROL  1 MMOL/ML IV SOLN COMPARISON:  MRI from 10/22/2019 FINDINGS: Segmentation: Transitional features about the lumbosacral junction with transitional L5 segment and rudimentary L5-S1 interspace. Same numbering system is employed as on previous exam. Alignment: Mild sigmoid scoliosis with predominant right convex component. 3 mm degenerative retrolisthesis of L2 on L3. Vertebrae: Postoperative changes from prior PLIF at L3-4. Additional wide posterior decompressive laminectomy at L4-5. Vertebral body height maintained without acute or chronic fracture. Bone marrow signal intensity diffusely heterogeneous but overall within normal limits. No worrisome osseous lesions. Degenerative reactive endplate change with marrow edema present about the L2-3 interspace. No abnormal marrow edema. Conus medullaris and cauda equina: Conus extends to the T12-L1 level. Conus and cauda equina appear normal. There is an apparent 2.5 cm focus of T1 hyperintensity involving the distal thecal sac at the  level of S1 (series 5, image 11). This is prominent on postcontrast sequence, but does not clearly enhance relative to precontrast T1 images. Area of saturates on STIR sequence, consistent with fat. Paraspinal and other soft tissues: Chronic postoperative scarring present within the posterior paraspinous soft tissues. Heterogeneous ovoid lesion just to the left of midline at the level of L5-S1 measures 4.2 x 3.1 cm, enlarged from prior MRI (previously 3.7 x 2.7 cm in 2021). 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 Disc levels: L1-2: Negative interspace. Mild bilateral facet and ligament flavum hypertrophy. No spinal stenosis. Foramina remain patent. L2-3: Degenerative vertebral disc space narrowing with disc desiccation and diffuse disc bulge. Reactive endplate change with marginal endplate osteophytic spurring. Changes asymmetric to the left. Moderate facet and ligament flavum hypertrophy with trace joint effusions. Resultant severe spinal stenosis, with severe right and moderate left L2 foraminal stenosis. Changes are progressed from prior. L3-4: Prior PLIF. No residual spinal stenosis. Foramina appear patent. L4-5: Disc desiccation with diffuse disc bulge. Prior posterior decompression. Moderate right with mild left facet arthrosis. No spinal stenosis. Moderate bilateral L4 foraminal narrowing. L5-S1: Transitional lumbosacral anatomy with rudimentary L5-S1 disc space. No disc bulge or focal disc herniation. Minimal facet spurring. No canal or foraminal stenosis. IMPRESSION: 1. Prior PLIF at L3-4 without residual or recurrent stenosis. 2. Progressive adjacent segment disease with multifactorial degenerative changes at L2-3, resulting in severe spinal stenosis, with severe right and moderate left L2 foraminal narrowing. 3. Disc bulge with facet arthrosis at L4-5 with resultant moderate bilateral L4 foraminal stenosis. 4. 5.7 cm infrarenal  aortic aneurysm, stable as compared to prior CT from 02/29/2024. Referral to vascular surgery recommended if not already performed. 5. 4.2 x 3.1 cm heterogeneous ovoid lesion just to the left of midline  at the level of L5-S1, mildly enlarged from prior MRI from 2021 (previously 3.7 x 2.7 cm). 6. 2.5 cm focus of T1 hyperintensity involving the distal thecal sac at the level of S1, likely fat. Finding is somewhat unusual as this appears to be located within the sac itself and not epidural in location as is typically seen. Finding is of uncertain significance, but new relative to previous MRI from 2021. Electronically Signed   By: Morene Hoard M.D.   On: 05/20/2024 00:13   VAS US  LOWER EXTREMITY VENOUS (DVT) Result Date: 05/18/2024  Lower Venous DVT Study Patient Name:  [name] [name]  Date of Exam:   05/17/2024 Medical Rec #: [id number]       Accession #:    [id number] Date of Birth: [birth year]       Patient Gender: M Patient Age:   78 years Exam Location:  [facility] Procedure:      VAS US  LOWER EXTREMITY VENOUS (DVT) Referring Phys: ARY XU --------------------------------------------------------------------------------  Indications: Stroke (embolic shower).  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 Limitations: Poor ultrasound/tissue interface and edema. Comparison Study: Prior negative bilateral LEV done 10/09/19 Performing Technologist: Alberta Lis RVS  Examination Guidelines: A complete evaluation includes B-mode imaging, spectral Doppler, color Doppler, and power Doppler as needed of all accessible portions of each vessel. Bilateral testing is considered an integral part of a complete examination. Limited examinations for reoccurring indications may be performed as noted. The reflux portion of the exam is performed with the patient in reverse Trendelenburg.  +---------+---------------+---------+-----------+----------+-------------------+ RIGHT     CompressibilityPhasicitySpontaneityPropertiesThrombus Aging      +---------+---------------+---------+-----------+----------+-------------------+ CFV      Full           Yes      No                                       +---------+---------------+---------+-----------+----------+-------------------+ SFJ      Full                                                             +---------+---------------+---------+-----------+----------+-------------------+ FV Prox  Full           Yes      No                                       +---------+---------------+---------+-----------+----------+-------------------+ FV Mid   Full                                                             +---------+---------------+---------+-----------+----------+-------------------+ FV DistalFull                                                             +---------+---------------+---------+-----------+----------+-------------------+  PFV      Full                                                             +---------+---------------+---------+-----------+----------+-------------------+ POP                     Yes      No                   patent by color and                                                       Doppler             +---------+---------------+---------+-----------+----------+-------------------+ PTV      Full                                                             +---------+---------------+---------+-----------+----------+-------------------+ PERO     Full                                                             +---------+---------------+---------+-----------+----------+-------------------+   +---------+---------------+---------+-----------+----------+-----------------+ LEFT     CompressibilityPhasicitySpontaneityPropertiesThrombus Aging    +---------+---------------+---------+-----------+----------+-----------------+ CFV      Partial         Yes      Yes                  Age Indeterminate +---------+---------------+---------+-----------+----------+-----------------+ FV Prox  Full           Yes      Yes                                    +---------+---------------+---------+-----------+----------+-----------------+ FV Mid   Full           Yes      Yes                                    +---------+---------------+---------+-----------+----------+-----------------+ FV DistalFull           Yes      Yes                                    +---------+---------------+---------+-----------+----------+-----------------+ PFV      Full           Yes      Yes                                    +---------+---------------+---------+-----------+----------+-----------------+  POP                     Yes      Yes                  Age Indeterminate +---------+---------------+---------+-----------+----------+-----------------+ PTV      None                                         Age Indeterminate +---------+---------------+---------+-----------+----------+-----------------+ PERO     None                                         Age Indeterminate +---------+---------------+---------+-----------+----------+-----------------+ EIV                     Yes      Yes                  patent            +---------+---------------+---------+-----------+----------+-----------------+     Summary: RIGHT: - There is no evidence of deep vein thrombosis in the lower extremity. However, portions of this examination were limited- see technologist comments above.  LEFT: - Findings consistent with partially occlusive, age indeterminate deep vein thrombosis involving the left common femoral and left popliteal veins, and age indeterminate, occlusive deep vein thrombosis involving the left posterior tibial and left peroneal  veins. The External iliac vein appears patent.   *See table(s) above for measurements and observations.  Electronically signed by Gaile New MD on 05/18/2024 at 2:40:58 PM.    Final      Labs:  Basic Metabolic Panel:    Latest Ref Rng & Units 06/08/2024    6:57 AM 06/04/2024    5:52 AM 06/01/2024    5:27 AM  BMP  Glucose 70 - 99 mg/dL 883  887  868   BUN 8 - 23 mg/dL 21  18  13    Creatinine 0.61 - 1.24 mg/dL 8.40  8.53  8.59   Sodium 135 - 145 mmol/L 139  141  140   Potassium 3.5 - 5.1 mmol/L 4.6  3.8  4.6   Chloride 98 - 111 mmol/L 105  108  106   CO2 22 - 32 mmol/L 26  23  25    Calcium  8.9 - 10.3 mg/dL 9.2  9.0  9.0      CBC: Recent Labs  Lab 06/03/24 1934 06/04/24 0552 06/08/24 0657  WBC 7.1 5.9 7.2  HGB 9.2* 9.0* 9.9*  HCT 29.6* 27.9* 31.2*  MCV 89.2 88.3 88.9  PLT 196 186 194    CBG: Recent Labs  Lab 06/08/24 0601 06/08/24 1151 06/08/24 1634 06/08/24 2031 06/09/24 0610  GLUCAP 115* 131* 158* 174* 123*    Brief HPI:   [name] [name] is a 78 y.o. 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 He was admitted on 05/08/24 for TEVAR w/selective stenting of celiac, SMB, right renal and left renal artery, by Dr. New. Post op course complicated thrombocytopenia, leucocytosis and acute on chronic renal failure. He was noted to have worsening of LLE weakness  with therapy and MRI brain done revealing acute non-hemorrhagic cerebellar infarct. CTA head/neck was negative for significant stenosis.  Neurology recommended DAPT X one month followed by ASA. PT/OT/ST were consulted and CIR recommended due to functional decline.    Hospital Course: [name] [name] was admitted to rehab 05/17/2024 for inpatient therapies to consist of PT, ST and OT at least three hours five days a week. Past admission physiatrist, therapy team and rehab RN have worked together to provide  customized collaborative inpatient rehab.  Subcu Lovenox  was used for DVT prophylaxis.  Partially occlusive age-indeterminate asymptomatic left femoral DVT was noted on Dopplers done as part of stroke workup and results discuss with Dr. Sheree who felt that no activity restriction needed.   He completed 4 week course of DAPT and remains on ASA alone. His blood pressures were monitored on TID basis and started trending upwards therefore amlodipine  resumed with improvement in BP control.  Follow-up CBC shows thrombocytopenia has resolved and H&H is relatively stable.  Constipation has been managed with augmentation of bowel regimen.  His diabetes has been monitored with ac/hs CBG checks and SSI was use prn for tighter BS control. BS started trending up with improvement in po intake and he was advised to follow up with PCP for input on alternate oral hypoglycemic due to elevated SCr.  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  Case was discussed with Dr. Beuford and patient advised to follow-up postdischarge.  Patient had episode of hematuria and UA done was positive for infection.  He was started on Keflex  for treatment and hematuria has resolved.  He is to complete 7-day course of antibiotic regimen.  Patient has made steady gains during his stay and currently requires min assist.  He will continue to receive follow-up home health PT, OT, ST, RN and aide by [facility]  home health after discharge   Rehab course: During patient's stay in rehab weekly team conferences were held to monitor patient's progress, set goals and discuss barriers to discharge. At admission, patient required total assist with basic ADL tasks and max assist with mobility. He exhibited cognitive deficits with SLUMS score of 14/30, with moderate deficits in memory, attention,  severe  visual deficits and moderate to severe deficits in processing. He  has had improvement in activity tolerance, balance, postural control as well as ability to compensate for deficits. He has had improvement in functional use LUE  and LLE as well as improvement in awareness. He requires min assist for LB dressing, toileting and bathing, supervision for UB dressing and bathing and is modified independent for feeding and grooming. He requires supervision with verbal cues for transfers and contact-guard to min assist to ambulate 10 feet with rolling walker.  He requires supervision to min assist for STM memory, problem solving, attention and auditory comprehension. Family education has been completed.   Discharge disposition: 06-Home-Health Care Svc  Diet: Heart healthy carb modified  Special Instructions: Follow-up with PCP for input on diabetes. Recommend repeat check B med and CBC in 1 to 2 weeks to monitor renal status and CBC.   Allergies as of 06/09/2024       Reactions   Lyrica [pregabalin] Nausea Only, Other (See Comments)   Hallucinations Dizziness    Sulfa Antibiotics Other (See Comments)   Unknown reaction        Medication List  STOP taking these medications    carvedilol  25 MG tablet Commonly known as: COREG    clopidogrel 75 MG tablet Commonly known as: PLAVIX   hydrALAZINE  10 MG tablet Commonly known as: APRESOLINE    losartan 50 MG tablet Commonly known as: COZAAR   metFORMIN 500 MG tablet Commonly known as: GLUCOPHAGE       TAKE these medications    acetaminophen  500 MG tablet Commonly known as: TYLENOL  Take 500 mg by mouth 2 (two) times daily. Notes to patient: Can resume at home if needed   amLODipine  10 MG tablet Commonly known as: NORVASC  Take 1 tablet (10 mg total) by mouth daily with supper. What changed: when to take this   aspirin  EC 81 MG tablet Take 1 tablet (81 mg total) by mouth daily.   cephALEXin  500 MG capsule Commonly known  as: KEFLEX  Take 1 capsule (500 mg total) by mouth 2 (two) times daily.   melatonin 5 MG Tabs Take 1 tablet (5 mg total) by mouth at bedtime as needed.   memantine  10 MG tablet Commonly known as: NAMENDA  TAKE 1 TABLET BY MOUTH TWICE DAILY   Mens 50+ Multivitamin Tabs Take 1 tablet by mouth daily.   pantoprazole  40 MG tablet Commonly known as: PROTONIX  Take 1 tablet (40 mg total) by mouth daily.   polyethylene glycol powder 17 GM/SCOOP powder Commonly known as: GLYCOLAX /MIRALAX  Dissolve 2 capfuls (34 g) in 4-8 ounces of liquid and take by mouth daily.   rosuvastatin  10 MG tablet Commonly known as: CRESTOR  Take 1 tablet (10 mg total) by mouth at bedtime.   tamsulosin  0.4 MG Caps capsule Commonly known as: FLOMAX  Take 1 capsule (0.4 mg total) by mouth daily after supper. What changed: when to take this        Follow-up Information     [facility] Follow up.   Why: office will call you with follow up appointment Contact information: [address]     [address] [place]  [address] [phone]        Beuford Anes, MD. Call.   Specialty: Orthopedic Surgery Why: for follow up on lumbar spinal stenosis. Contact information: [address] [place] [address] [phone]         Sheree Penne Bruckner, MD Follow up on 06/17/2024.   Specialties: Vascular Surgery, Cardiology Why: Be there at 12:15 pm  for 12:30 appointment Contact information: [address] [place] [address] [phone]         [name], MD Follow up.   Specialty: Internal Medicine Why: Call in 1-2 days for post hospital follow up Contact information: [address], [address] [place] [address] [phone]         Carilyn Prentice BRAVO, MD Follow up.   Specialty: Physical Medicine and Rehabilitation Why: office will call you with follow up appointment Contact information: [address] [place] [address] [phone]                 Signed: Sharlet GORMAN Schmitz 06/15/2024, 5:25 PM

## 2024-05-27 NOTE — Progress Notes (Signed)
 Speech Language Pathology Daily Session Note  Patient Details  Name: [name] [name] MRN: [id number] Date of Birth: 02-15-1946  Today's Date: 05/27/2024 SLP Individual Time: 8954-8873 SLP Individual Time Calculation (min): 41 min  Short Term Goals: Week 2: SLP Short Term Goal 1 (Week 2): Patient will demonstrate problem solving in mildly complex situations given min multimodal A SLP Short Term Goal 2 (Week 2): Patient will utilize external aids to recall functional daily information with 80% accuracy given min assist. SLP Short Term Goal 3 (Week 2): Pt will process mildly complex information w/ min A SLP Short Term Goal 4 (Week 2): Pt will complete mildly specific word finding tasks w/ minA SLP Short Term Goal 5 (Week 2): Pt will complete verbal organization/thought formulation tasks w/ minA  Skilled Therapeutic Interventions:  Patient was seen in am to address cognitive re- training. Pt was alert and seated upright in WC upon SLP arrival. He denied pain and was agreeable for session. Pt appeared to present with increased processing difficulty taking longer than usual to answer questions. He was oriented to all concepts with additional time and indep recalled activities completed in OT session. SLP challenging pt in recall of therapy schedule in order to maximize recall of information and use of external aids. After a ~20 minute distracted delay pt recall information with min A and use of external aid. Pt also challenged in a mildly complex scheduling task where he transcribed dates into an open calendar. Pt requiring extensive amount of time and mod A for use of strategies. Tasks ultimately abandoned as pt reporting fatigue. Pt was assisted back to bed via stedy with assist of assigned NT. Assigned nurse notified of change in behavior. Pt was left in bed with call button within reach and bed alarm active. SLP to continue POC.   Pain Pain Assessment Pain Scale: 0-10 Pain Score: 0-No pain Pain  Location: Back Pain Intervention(s): Medication (See eMAR)  Therapy/Group: Individual Therapy  [name] 05/27/2024, 11:05 AM

## 2024-05-27 NOTE — Progress Notes (Signed)
 Physical Therapy Session Note  Patient Details  Name: [name] [name] MRN: [id number] Date of Birth: 01/04/1946  Today's Date: 05/27/2024 PT Individual Time: 1346-1458 PT Individual Time Calculation (min): 72 min   Short Term Goals: Week 1:  PT Short Term Goal 1 (Week 1): Pt will be able to perform bed mobility with mod A PT Short Term Goal 1 - Progress (Week 1): Met PT Short Term Goal 2 (Week 1): Pt will be able to perform transfers bed <> w/c with mod A PT Short Term Goal 2 - Progress (Week 1): Met PT Short Term Goal 3 (Week 1): Pt will initiate gait PT Short Term Goal 3 - Progress (Week 1): Met PT Short Term Goal 4 (Week 1): Pt will demonstrate dynamic sitting balance with min A PT Short Term Goal 4 - Progress (Week 1): Met Week 2:  PT Short Term Goal 1 (Week 2): Pt will be able to perform bed mobility with consistent MinA PT Short Term Goal 2 (Week 2): Pt will be able to perform transfers bed <> w/c with consistent Min A PT Short Term Goal 3 (Week 2): Pt will demonstrate dynamic sitting balance with CGA PT Short Term Goal 4 (Week 2): Pt will ambulate >50 ft consistently with CGA/ MinA.  Skilled Therapeutic Interventions/Progress Updates:  Patient supine in bed on entrance to room. Patient alert and agreeable to PT session.   Patient with no pain complaint at start of session.  Received info from speech therapist and from OT earlier in day that pt was demonstrating delayed processing and requiring more physical help with some disorientation.   Therapeutic Activity: Bed Mobility: Pt performed supine <> sit requiring extra time for initiation and completion and ultimately requiring MinA to complete. Struggles again today with posterior lean and requires minA to scoot forward and reach seated position on EOB. VC/ tc required throughout for continued education re: sequencing movements required for anterior weight shift. Transfers: Pt performed sit<>stand and stand pivot transfers  throughout session with Mod A initially and following continued NMR is able to reach CGA. Provided vc/ tc for sequencing as detailed below. Stand pivot transfer initially from bed>w/c requires Min/ ModA using RW.   Toilet transfer performed using RW from w/c to toilet and overall Min/ ModA.   Neuromuscular Re-ed: NMR facilitated during session with focus on standing balance and overcoming posterior bias in stance. Pt positioned in w/c in front of elevated mat table in order to promote forward lean. Guided initially in far forward hand placement for increased understanding re: far forward trunk positioning. Guided in use of LUE to push from w/c to initiate rise to stand with BUE placed palms down on mat table. Pt in modified quadruped on mat table and when asked, relates that he can tell the difference in weight shifting over toes rather than flat foot or heels with more forward hand placement. Educated pt in need for heavy weight to be in toes in order to overcome his tendency to place weight back over heels. Break for toileting.   In room, blocked practice for sequencing of rise to stand: Forward scoot Posterior foot placement Hands positioned on armrests Sitting with head shoulders high and chest out Hinging forward as far as he can (practiced with sliding hands down shins to ankles and providing awareness for how far forward pt can lean trunk) and pushing self FORWARD with hands  Pushing feet hard down through floor Reaching one hand at a time to RW  Requires less and less cueing throughout for proper sequence and is able to improve from ModA to rise and balance with MinA to close supervision to rise and CGA to balance. Will need to continue with sequencing in order to solidify motor memory.   NMR performed for improvements in motor control and coordination, balance, sequencing, judgement, and self confidence/ efficacy in performing all aspects of mobility at highest level of independence.    Patient seated upright in w/c at end of session with brakes locked, belt alarm set, and all needs within reach.   Therapy Documentation Precautions:  Precautions Precautions: Fall Recall of Precautions/Restrictions: Impaired Precaution/Restrictions Comments: incision near L clavicle (axillary artery) and bil groins; L weakness Restrictions Weight Bearing Restrictions Per Provider Order: No  Pain:      Therapy/Group: Individual Therapy  [name] PT, DPT, CSRS 05/27/2024, 6:06 PM

## 2024-05-27 NOTE — Plan of Care (Signed)
  Problem: Consults Goal: RH GENERAL PATIENT EDUCATION Description: See Patient Education module for education specifics. Outcome: Progressing   Problem: RH BOWEL ELIMINATION Goal: RH STG MANAGE BOWEL WITH ASSISTANCE Description: STG Manage Bowel with mod I Assistance. Outcome: Progressing Goal: RH STG MANAGE BOWEL W/MEDICATION W/ASSISTANCE Description: STG Manage Bowel with Medication with mod I Assistance. Outcome: Progressing   Problem: RH BLADDER ELIMINATION Goal: RH STG MANAGE BLADDER WITH ASSISTANCE Description: STG Manage Bladder With mod I Assistance Outcome: Progressing Goal: RH STG MANAGE BLADDER WITH MEDICATION WITH ASSISTANCE Description: STG Manage Bladder With Medication With Assistance. Outcome: Progressing   Problem: RH SAFETY Goal: RH STG ADHERE TO SAFETY PRECAUTIONS W/ASSISTANCE/DEVICE Description: STG Adhere to Safety Precautions With cues  Assistance/Device. Outcome: Progressing   Problem: RH PAIN MANAGEMENT Goal: RH STG PAIN MANAGED AT OR BELOW PT'S PAIN GOAL Description: Pain < 4 with prns Outcome: Progressing   Problem: RH KNOWLEDGE DEFICIT GENERAL Goal: RH STG INCREASE KNOWLEDGE OF SELF CARE AFTER HOSPITALIZATION Description: Patient and spouse will be able to manage care using educational resources for medications, diet independently Outcome: Progressing   Problem: Education: Goal: Ability to describe self-care measures that may prevent or decrease complications (Diabetes Survival Skills Education) will improve Outcome: Progressing Goal: Individualized Educational Video(s) Outcome: Progressing   Problem: Coping: Goal: Ability to adjust to condition or change in health will improve Outcome: Progressing   Problem: Fluid Volume: Goal: Ability to maintain a balanced intake and output will improve Outcome: Progressing   Problem: Health Behavior/Discharge Planning: Goal: Ability to identify and utilize available resources and services will  improve Outcome: Progressing Goal: Ability to manage health-related needs will improve Outcome: Progressing   Problem: Metabolic: Goal: Ability to maintain appropriate glucose levels will improve Outcome: Progressing   Problem: Nutritional: Goal: Maintenance of adequate nutrition will improve Outcome: Progressing Goal: Progress toward achieving an optimal weight will improve Outcome: Progressing   Problem: Skin Integrity: Goal: Risk for impaired skin integrity will decrease Outcome: Progressing   Problem: Tissue Perfusion: Goal: Adequacy of tissue perfusion will improve Outcome: Progressing   Problem: RH Vision Goal: RH LTG Vision (Specify) Outcome: Progressing

## 2024-05-27 NOTE — Progress Notes (Signed)
 PROGRESS NOTE   Subjective/Complaints:  No issues overnite  BM x 2 yesterday continent 1/2 ? Cath for 50mL  ROS-   Pt denies SOB, abd pain, CP, N/V/C/D   Objective:  MRI LUMBAR SPINE WITHOUT CONTRAST   TECHNIQUE: Multiplanar, multisequence MR imaging of the lumbar spine was performed. No intravenous contrast was administered.   COMPARISON:  MRI of the lumbar spine March 06, 2018 and January 23, 2013   FINDINGS: Segmentation: A transitional lumbosacral vertebra is assumed to represent the L5 level. Careful correlation with this numbering strategy prior to any procedural intervention would be recommended.   Alignment:  Physiologic.   Vertebrae: Interbody fusion and posterior left transpedicular fixation is noted at L3-4 with laminectomy at L4-5. Degenerative endplate changes are noted at L3-4. Marrow signal characteristics are otherwise maintained.   Conus medullaris and cauda equina: Conus extends to the T12-L1 level. Conus appear normal. Redundancy of nerve roots at the level of L1 is related to L2-3 spinal canal stenosis.   Paraspinal and other soft tissues: Well-defined oval lesion located to the left of midline in the prevertebral soft tissue at the level of L5-S1 is seen in the sagittal views. The lesion as predominantly hypointense signal on T1 and heterogeneous signal on T2. When compared to prior study, the lesion appears with similar size (3.7 x 2.7 cm) when compared to study performed 2019 but enlarged when compared to MRI performed 2014. T2 signal appear changed when compared to prior study.   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   Right renal cysts.   Disc levels:   T12-L1: No spinal canal or neural foraminal stenosis.   L1-2: No spinal canal or neural foraminal stenosis.   L2-3: Disc bulge with superimposed tiny right central disc protrusion, facet degenerative  changes and ligamentum flavum redundancy resulting in moderate to severe spinal canal stenosis, moderate right and severe left neural foraminal stenosis.   L3-4: Laminectomy with facet fusion. No spinal canal or neural foraminal stenosis.   L4-5: Disc bulge with tiny superimposed central disc protrusion and mild facet degenerative changes result in severe bilateral neural foraminal narrowing. Laminectomy is also noted with no spinal canal stenosis.   L5-S1: No spinal canal or neural foraminal stenosis.   Compared to prior study, there has been significant progression of the degree of spinal canal stenosis at L2-3.   IMPRESSION: 1. 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 This has progressed when compared to prior MRI. 2. Well-defined oval lesion the prevertebral soft tissues to the left of midline at the L5-S1 level, only seen on the sagittal images. This appear with similar size although slightly different T2 signal when compared to study performed 2019 but enlarged when compared to study performed in 2014. 3. 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 Recommend followup by ultrasound in 2 years. This recommendation follows ACR consensus guidelines: White Paper of the ACR Incidental Findings Committee II on Vascular Findings. J Am Coll Radiol 2013; 10:     Electronically Signed  By: [name] M.D.   On: 10/22/2019 14:06 No results found.  Recent Labs    05/25/24 0659  WBC 7.0  HGB 9.1*  HCT 28.8*  PLT 401*    Recent Labs    05/25/24 0659  NA 141  K 4.2  CL 109  CO2 24  GLUCOSE 101*  BUN 17  CREATININE 1.43*  CALCIUM  8.8*     Intake/Output Summary (Last 24 hours) at 05/27/2024 0758 Last data filed at 05/26/2024 0909 Gross per 24 hour  Intake 240 ml   Output --  Net 240 ml        Physical Exam: Vital Signs Blood pressure (!) 143/88, pulse 85, temperature 98.9 F (37.2 C), resp. rate 18, height 5' 11 (1.803 m), weight 85 kg, SpO2 100%.       General: No acute distress Mood and affect are appropriate Heart: Regular rate and rhythm no rubs murmurs or extra sounds Lungs: Clear to auscultation, breathing unlabored, no rales or wheezes Abdomen: Positive bowel sounds, soft nontender to palpation, nondistended Extremities: No clubbing, cyanosis, or edema Skin: No evidence of breakdown, no evidence of rash  Neurologic: Cranial nerves II through XII intact, motor strength is 5/5 in bilateral deltoid, bicep, tricep, grip,3-/5 R RIght and 2-/5 left  hip flexor, 4- Right and 3- left knee extensors, 5/5 RIght and 4/5 left ankle dorsiflexor and plantar flexor   Musculoskeletal: Full range of motion in all 4 extremities. No joint swelling Oriented to Miah and place but not time, delayed responses   Assessment/Plan: 1. Functional deficits which require 3+ hours per day of interdisciplinary therapy in a comprehensive inpatient rehab setting. Physiatrist is providing close team supervision and 24 hour management of active medical problems listed below. Physiatrist and rehab team continue to assess barriers to discharge/monitor patient progress toward functional and medical goals  Care Tool:  Bathing    Body parts bathed by patient: Right arm, Left arm, Chest, Front perineal area, Abdomen, Right upper leg, Left upper leg, Face   Body parts bathed by helper: Buttocks, Right lower leg, Left lower leg     Bathing assist Assist Level: Moderate Assistance - Patient 50 - 74%     Upper Body Dressing/Undressing Upper body dressing   What is the patient wearing?: Pull over shirt    Upper body assist Assist Level: Minimal Assistance - Patient > 75%    Lower Body Dressing/Undressing Lower body dressing      What is the patient  wearing?: Pants     Lower body assist Assist for lower body dressing: Maximal Assistance - Patient 25 - 49%     Toileting Toileting    Toileting assist Assist for toileting: Moderate Assistance - Patient 50 - 74%     Transfers Chair/bed transfer  Transfers assist     Chair/bed transfer assist level: Moderate Assistance - Patient 50 - 74%     Locomotion Ambulation   Ambulation assist   Ambulation activity did not occur: Safety/medical concerns  Assist level: Minimal Assistance - Patient > 75% Assistive device: Walker-rolling Max distance: 10'   Walk 10 feet activity   Assist  Walk 10 feet activity did not occur: Safety/medical concerns  Assist level: Minimal Assistance - Patient > 75% Assistive device: Walker-rolling   Walk 50 feet activity   Assist Walk 50 feet with 2 turns activity did not occur: Safety/medical concerns         Walk 150 feet activity   Assist Walk 150  feet activity did not occur: Safety/medical concerns         Walk 10 feet on uneven surface  activity   Assist Walk 10 feet on uneven surfaces activity did not occur: Safety/medical concerns         Wheelchair     Assist Is the patient using a wheelchair?: Yes Type of Wheelchair: Manual    Wheelchair assist level: Supervision/Verbal cueing Max wheelchair distance: 100'    Wheelchair 50 feet with 2 turns activity    Assist        Assist Level: Supervision/Verbal cueing   Wheelchair 150 feet activity     Assist      Assist Level: Minimal Assistance - Patient > 75%   Blood pressure (!) 143/88, pulse 85, temperature 98.9 F (37.2 C), resp. rate 18, height 5' 11 (1.803 m), weight 85 kg, SpO2 100%.  Medical Problem List and Plan: 1. 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  Antithrombotics: -DVT/anticoagulation:   Pharmaceutical: Lovenox , age indeterminate asymptomatic Left Fem DVT, per Dr Sheree VVS, cont current meds, no activity restriction              -antiplatelet therapy: continue ASA   3. Pain Management: continue Oxycodone  prn for severe and tylenol  prn for mild pain   10/4-10/5- denies pain at all this AM- con't regimen 4. Mood/Behavior/Sleep: LCSW to follow for evaluation and support.              -antipsychotic agents: N/A   5. Neuropsych/cognition: This patient is not capable of making decisions on his own behalf. 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 Skin/Wound Care: Routine pressure relief measures. Monitor incision for healing   7. Fluids/Electrolytes/Nutrition:  Monitor I/O. Check CMET in am             --offer supplement prn   8. T2DM: Monitor BS ac/hs and use SSI for elevated BS             --Was on metform 500 mg bid--held due to dye study, consider resuming once kidney function improves   10/4- BG's running 100-118- con't to monitor 9.HTN: Monitor BP TID--off home meds at this time. BP reviewed and is starting to increase, will start amlodipine  2.5mg  daily Vitals:   05/26/24 2021 05/27/24 0340  BP: 136/80 (!) 143/88  Pulse: 73 85  Resp: 16 18  Temp: 98.5 F (36.9 C) 98.9 F (37.2 C)  SpO2: 99% 100%  Increase amlodipine  to 10mg  daily - sys elevation mild    10. AKI: BUN/SCr 11/1.21 at admission with rise to 19/1.54 post procedure                 Latest Ref Rng & Units 05/25/2024    6:59 AM 05/21/2024    5:13 AM 05/18/2024    6:17 AM  BMP  Glucose 70 - 99 mg/dL 898  893  890   BUN 8 - 23 mg/dL 17  27  18    Creatinine 0.61 - 1.24 mg/dL 8.56  8.46  8.61   Sodium 135 - 145 mmol/L 141  138  138   Potassium 3.5 - 5.1 mmol/L 4.2  3.8  4.0  Chloride 98 - 111 mmol/L 109  105  109   CO2 22 - 32 mmol/L 24  21  20    Calcium  8.9 - 10.3 mg/dL 8.8  8.7  8.5      BUN and Creat creeping up , oral  fluid intake is poor , but intake is improving to yesterday  11. Thrombocytopenia: resolved now in high normal range   12.  ABLA: Recheck stable no hematuria, melena noted, cont to monitor at least weekly     Latest Ref Rng & Units 05/25/2024    6:59 AM 05/21/2024    5:13 AM 05/18/2024    6:17 AM  CBC  WBC 4.0 - 10.5 K/uL 7.0  10.1  9.8   Hemoglobin 13.0 - 17.0 g/dL 9.1  8.9  8.8   Hematocrit 39.0 - 52.0 % 28.8  28.1  26.8   Platelets 150 - 400 K/uL 401  398  326    stable   13. Leucocytosis: resolved    14.  H/o dementia vascular: continue Namenda .  Has been seeing neuro as outpt   15.  Bilateral LE weakness proximally and Left distal LE weakness with hx Lumbar stenosis , suspect progression since prior Lumbar MRI in 2021, pt has had L3-4 fusion per Dr Beuford in 2021- some improvement in LE strength , pt feels decline in strength was post op after aneurysm repair, however pt has hx of dementia and is poor historian, had Hospital admit July 2025 and was reportedly using walker at baseline and went to SNF.  Using walker at OV with VVS 04/08/2024 Pt had repeat MRI lumbar showing progressive L2-3 central stenosis , severe bilateral L4-5 foraminal stenosis, intrathecal T1 signal at L5-S1 Discussed case with Dr Beuford.  Will need f/u with Dr Beuford post discharge    LOS: 10 days A FACE TO FACE EVALUATION WAS PERFORMED  Prentice FORBES Compton 05/27/2024, 7:58 AM

## 2024-05-27 NOTE — Progress Notes (Signed)
 Labs show evidence of dehydration and vitals with tachycardia with positional changes likely due to orthostasis. IVF X 1 liter for hydration.

## 2024-05-27 NOTE — Progress Notes (Signed)
 Patient ID: [name] [name], male   DOB: 08/06/1946, 78 y.o.   MRN: [id number] Met with pt and spoke with wife via telephone to discuss team conference progress this week in therapies and his goals still CGA-min level with a discharge date of 10/21. Concern is if wife can provide the care pt will need due to at times he fluctuates at times and is more care. Wife plans to be here for his afternoon therapies to observe and see his progress and care needs at this point. Pt has been to a SNF in the past and wife is aware of this option but may or may not get insurance coverage since came to [facility] first. Will continue to work with wife on best plan for husband. Continue to work on discharge plan. PT reports he is not doing as well today and has let PA know this.

## 2024-05-28 LAB — CBC
HCT: 28.4 % — ABNORMAL LOW (ref 39.0–52.0)
Hemoglobin: 9.1 g/dL — ABNORMAL LOW (ref 13.0–17.0)
MCH: 28.9 pg (ref 26.0–34.0)
MCHC: 32 g/dL (ref 30.0–36.0)
MCV: 90.2 fL (ref 80.0–100.0)
Platelets: 304 K/uL (ref 150–400)
RBC: 3.15 MIL/uL — ABNORMAL LOW (ref 4.22–5.81)
RDW: 15.5 % (ref 11.5–15.5)
WBC: 11.7 K/uL — ABNORMAL HIGH (ref 4.0–10.5)
nRBC: 0 % (ref 0.0–0.2)

## 2024-05-28 LAB — GLUCOSE, CAPILLARY
Glucose-Capillary: 102 mg/dL — ABNORMAL HIGH (ref 70–99)
Glucose-Capillary: 92 mg/dL (ref 70–99)
Glucose-Capillary: 130 mg/dL — ABNORMAL HIGH (ref 70–99)
Glucose-Capillary: 148 mg/dL — ABNORMAL HIGH (ref 70–99)
Glucose-Capillary: 121 mg/dL — ABNORMAL HIGH (ref 70–99)

## 2024-05-28 LAB — BASIC METABOLIC PANEL WITH GFR
Anion gap: 11 (ref 5–15)
BUN: 23 mg/dL (ref 8–23)
CO2: 23 mmol/L (ref 22–32)
Calcium: 8.7 mg/dL — ABNORMAL LOW (ref 8.9–10.3)
Chloride: 108 mmol/L (ref 98–111)
Creatinine, Ser: 1.7 mg/dL — ABNORMAL HIGH (ref 0.61–1.24)
GFR, Estimated: 41 mL/min — ABNORMAL LOW (ref >60)
Glucose, Bld: 111 mg/dL — ABNORMAL HIGH (ref 70–99)
Potassium: 3.8 mmol/L (ref 3.5–5.1)
Sodium: 142 mmol/L (ref 135–145)

## 2024-05-28 NOTE — Progress Notes (Signed)
 Occupational Therapy Session Note  Patient Details  Name: [name] [name] MRN: [id number] Date of Birth: 26-Nov-1945  Today's Date: 05/28/2024 OT Individual Time: 1333-1415 OT Individual Time Calculation (min): 42 min    Short Term Goals: Week 2:  OT Short Term Goal 1 (Week 2): Pt will complete toileting with MOD A using LRAD OT Short Term Goal 2 (Week 2): Pt will complete toilet transfers with MIN A using LRAD OT Short Term Goal 3 (Week 2): Pt will tolerate standing using LRAD for 2 min during functional activity or ADLs OT Short Term Goal 4 (Week 2): Pt will complete LB dressing with MOD A using LRAD  Skilled Therapeutic Interventions/Progress Updates:    Pt received WC with wife present. Pt presenting to be in good spirits receptive to skilled OT session reporting 0/10 pain- OT offering intermittent rest breaks, repositioning, and therapeutic support to optimize participation in therapy session. Provided education regarding benefits of U/LB exercises to increase endurance, strength, and coordination. Pt receptive of education. Transported Pt to day room via [facility] d/t time management to engage in session. Engaged Pt in series of U/LB exercises while listening to Pt desired music. Pt able to complete exercises with visual model from therapist, requiring MOD tactile cues to complete movement of exercise and verbal cues technique. Provided encouragement to Pt about taking rest breaks when appropriate. Noticeable improved mood and engagement at the end of session. Pt transported back to room vis WC d/t fatigue and time management. Pt was left resting in [name] with call bell in reach, wife present, and all needs met.    Therapy Documentation Precautions:  Precautions Precautions: Fall Recall of Precautions/Restrictions: Impaired Precaution/Restrictions Comments: incision near L clavicle (axillary artery) and bil groins; L weakness Restrictions Weight Bearing Restrictions Per Provider Order:  No    Therapy/Group: Individual Therapy  Aristeo Hankerson Van Schaick 05/28/2024, 3:28 PM

## 2024-05-28 NOTE — Progress Notes (Signed)
 Physical Therapy Session Note  Patient Details  Name: [name] [name] MRN: [id number] Date of Birth: [birth year]  Today's Date: 05/28/2024 PT Individual Time: 1531-1601  PT Individual Time Calculation (min): 30 min  Short Term Goals: Week 2:  PT Short Term Goal 1 (Week 2): Pt will be able to perform bed mobility with consistent MinA PT Short Term Goal 2 (Week 2): Pt will be able to perform transfers bed <> w/c with consistent Min A PT Short Term Goal 3 (Week 2): Pt will demonstrate dynamic sitting balance with CGA PT Short Term Goal 4 (Week 2): Pt will ambulate >50 ft consistently with CGA/ MinA.  Skilled Therapeutic Interventions/Progress Updates:  Chart reviewed and pt agreeable to therapy. Pt received seated in WC with 0/10 c/o pain. Session focused on functional transfers to promote functional recovery for out of bed mobility. Pt initiated session with sit to stand in STEDY using maxA + B hand hold on STEDY. Pt then completed 10x sit to stand in STEDY from elevated seat height. In standing, pt completed lateral weight shifts. Pt then requesting toileting. Pt required minA to stand in STEDY from [facility] height and S for seated balance during STEDY transfer. Pt required maxA for pericare. Pt then completed blocked practice of sit to stand at bedside to progress towards eventual sit to stand using RW. Pt instructed on sit to stand technique including scooting to edge of chair, tucking feet, and leaning forward. Pt completed first stand with B hands on hand rail + minA. Pt then completed sit to stand in same set up using 1 hand on rail and 1 hand on WC. All stands completed with minA + strong VC for set up. Pt educated on continuing practice to progress towards stand with RW.  At end of session, pt was left seated in [facility] with alarm engaged, nurse call bell and all needs in reach.     Therapy Documentation Precautions:  Precautions Precautions: Fall Recall of Precautions/Restrictions:  Impaired Precaution/Restrictions Comments: incision near L clavicle (axillary artery) and bil groins; L weakness Restrictions Weight Bearing Restrictions Per Provider Order: No General:      Therapy/Group: Individual Therapy   Warrick KANDICE Raspberry 05/28/2024, 4:10 PM

## 2024-05-28 NOTE — Consult Note (Signed)
 Reason for Consult: Bilateral leg weakness Referring Physician: Dr. Carilyn Lynwood RAMAN Loveall he is currently undergoing rehab due to a recent stroke.  I was contacted by Dr. Carilyn due to his bilateral leg weakness, and his MRI findings, notable for severe stenosis.  He is status post a previous fusion at L3-4, many years ago.  Per the patient, he denies any significant pain into his right or left legs.  He does feel that he has weakness in his legs, but he does feel as though he is making good progress with physical therapy.  He does have some pain in the back described as an aching sensation, controlled with medication.  Overall, he does feel his progress and recovery has been going well.  Past Medical History:  Diagnosis Date   Chronic back pain    stenosis   Diabetes mellitus without complication (HCC)    Enlarged prostate    GERD (gastroesophageal reflux disease)    occasionally will take a zantac(maybe once a month)   History of colon polyps    History of kidney stones    History of stress test    done 10 yrs. ago, as a baseline    Hyperlipidemia    takes Crestor  daily   Hypertension    takes Amlodipine  daily and Lotensin  as well   Stroke [facility])     Past Surgical History:  Procedure Laterality Date   ANTERIOR LAT LUMBAR FUSION Left 12/15/2014   Procedure: ANTERIOR LATERAL LUMBAR FUSION 1 LEVEL;  Surgeon: Oneil Priestly, MD;  Location: [facility] OR;  Service: Orthopedics;  Laterality: Left;  Left sided lateral lumbar interbody fusion, lumbar 3-4, posterior spinal fusion, lumbar 3-4 with instrumentation.   COLONOSCOPY     ESOPHAGOGASTRODUODENOSCOPY     fatty tissue removed from stomach     LUMBAR LAMINECTOMY/DECOMPRESSION MICRODISCECTOMY N/A 07/08/2013   Procedure: LUMBAR LAMINECTOMY/DECOMPRESSION MICRODISCECTOMY;  Surgeon: Oneil Rodgers Priestly, MD;  Location: [facility] OR;  Service: Orthopedics;  Laterality: N/A;  Lumbar 3-4, lumbar 4-5 decompression   RADIOLOGY WITH ANESTHESIA N/A 10/22/2019    Procedure: MRI WITH ANESTHESIA OF LUMBAR SPINE WITHOUT CONTRAST;  Surgeon: Radiologist, Medication, MD;  Location: [facility] OR;  Service: Radiology;  Laterality: N/A;   right ankle surgery     as child    THORACIC AORTIC ENDOVASCULAR STENT GRAFT N/A 10/15/2019   Procedure: THORACIC AORTIC ENDOVASCULAR STENT GRAFT;  Surgeon: Sheree Penne Bruckner, MD;  Location: [facility] OR;  Service: Vascular;  Laterality: N/A;   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 No pertinent family history.  Social History:  reports that he has quit smoking. His smoking use included cigarettes. He has a 10 pack-year smoking history. He has never used smokeless tobacco. He reports that he does not drink alcohol and does not use drugs.  Allergies:  Allergies  Allergen Reactions   Lyrica [Pregabalin] Nausea Only and Other (See Comments)    Hallucinations Dizziness    Sulfa Antibiotics Other (See Comments)    Unknown reaction    Medications: I have reviewed the patient's current medications.  Results for orders placed or performed  during the hospital encounter of 05/17/24 (from the past 48 hours)  Glucose, capillary     Status: Abnormal   Collection Time: 05/26/24 12:05 PM  Result Value Ref Range   Glucose-Capillary 120 (H) 70 - 99 mg/dL    Comment: Glucose reference range applies only to samples taken after fasting for at least 8 hours.  Glucose, capillary     Status: Abnormal   Collection Time: 05/26/24  4:29 PM  Result Value Ref Range   Glucose-Capillary 120 (H) 70 - 99  mg/dL    Comment: Glucose reference range applies only to samples taken after fasting for at least 8 hours.  Glucose, capillary     Status: Abnormal   Collection Time: 05/26/24  9:30 PM  Result Value Ref Range   Glucose-Capillary 137 (H) 70 - 99 mg/dL    Comment: Glucose reference range applies only to samples taken after fasting for at least 8 hours.  Glucose, capillary     Status: Abnormal   Collection Time: 05/27/24  5:57 AM  Result Value Ref Range   Glucose-Capillary 126 (H) 70 - 99 mg/dL    Comment: Glucose reference range applies only to samples taken after fasting for at least 8 hours.  Glucose, capillary     Status: Abnormal   Collection Time: 05/27/24 12:34 PM  Result Value Ref Range   Glucose-Capillary 142 (H) 70 - 99 mg/dL    Comment: Glucose reference range applies only to samples taken after fasting for at least 8 hours.  Basic metabolic panel     Status: Abnormal   Collection Time: 05/27/24  3:23 PM  Result Value Ref Range   Sodium 138 135 - 145 mmol/L   Potassium 4.0 3.5 - 5.1 mmol/L   Chloride 105 98 - 111 mmol/L   CO2 19 (L) 22 - 32 mmol/L   Glucose, Bld 148 (H) 70 - 99 mg/dL    Comment: Glucose reference range applies only to samples taken after fasting for at least 8 hours.   BUN 24 (H) 8 - 23 mg/dL   Creatinine, Ser 8.24 (H) 0.61 - 1.24 mg/dL   Calcium  9.3 8.9 - 10.3 mg/dL   GFR, Estimated 39 (L) >60 mL/min    Comment: (NOTE) Calculated using the CKD-EPI Creatinine Equation (2021)    Anion gap 14 5 - 15    Comment: Performed at [facility], [address], [place], [place] [address]  CBC with Differential/Platelet     Status: Abnormal   Collection Time: 05/27/24  3:23 PM  Result Value Ref Range   WBC 20.4 (H) 4.0 - 10.5 K/uL   RBC 3.60 (L) 4.22 - 5.81 MIL/uL   Hemoglobin 10.1 (L) 13.0 - 17.0 g/dL   HCT 67.4 (L) 60.9 - 47.9 %   MCV 90.3 80.0 - 100.0 fL   MCH 28.1 26.0 - 34.0 pg   MCHC 31.1 30.0 - 36.0 g/dL   RDW 84.6 88.4 - 84.4 %   Platelets  429 (H) 150 - 400 K/uL   nRBC 0.0 0.0 - 0.2 %   Neutrophils Relative % 85 %   Neutro Abs 17.2 (H) 1.7 - 7.7 K/uL   Lymphocytes Relative 6 %   Lymphs Abs 1.2 0.7 - 4.0 K/uL   Monocytes Relative 8 %   Monocytes Absolute 1.5 (H) 0.1 - 1.0 K/uL   Eosinophils Relative 0 %   Eosinophils Absolute 0.1 0.0 - 0.5 K/uL   Basophils Relative 0 %   Basophils Absolute  0.1 0.0 - 0.1 K/uL   Immature Granulocytes 1 %   Abs Immature Granulocytes 0.18 (H) 0.00 - 0.07 K/uL    Comment: Performed at [facility], [address], [place], [place] [address]  Glucose, capillary     Status: Abnormal   Collection Time: 05/27/24  5:44 PM  Result Value Ref Range   Glucose-Capillary 131 (H) 70 - 99 mg/dL    Comment: Glucose reference range applies only to samples taken after fasting for at least 8 hours.  Glucose, capillary     Status: Abnormal   Collection Time: 05/27/24  9:03 PM  Result Value Ref Range   Glucose-Capillary 120 (H) 70 - 99 mg/dL    Comment: Glucose reference range applies only to samples taken after fasting for at least 8 hours.  Basic metabolic panel     Status: Abnormal   Collection Time: 05/28/24  5:22 AM  Result Value Ref Range   Sodium 142 135 - 145 mmol/L   Potassium 3.8 3.5 - 5.1 mmol/L   Chloride 108 98 - 111 mmol/L   CO2 23 22 - 32 mmol/L   Glucose, Bld 111 (H) 70 - 99 mg/dL    Comment: Glucose reference range applies only to samples taken after fasting for at least 8 hours.   BUN 23 8 - 23 mg/dL   Creatinine, Ser 8.29 (H) 0.61 - 1.24 mg/dL   Calcium  8.7 (L) 8.9 - 10.3 mg/dL   GFR, Estimated 41 (L) >60 mL/min    Comment: (NOTE) Calculated using the CKD-EPI Creatinine Equation (2021)    Anion gap 11 5 - 15    Comment: Performed at [facility], [address], [place], [place] [address]  CBC     Status: Abnormal   Collection Time: 05/28/24  5:22 AM  Result Value Ref Range   WBC 11.7 (H) 4.0 - 10.5 K/uL   RBC 3.15 (L) 4.22 - 5.81 MIL/uL   Hemoglobin 9.1 (L) 13.0 -  17.0 g/dL   HCT 71.5 (L) 60.9 - 47.9 %   MCV 90.2 80.0 - 100.0 fL   MCH 28.9 26.0 - 34.0 pg   MCHC 32.0 30.0 - 36.0 g/dL   RDW 84.4 88.4 - 84.4 %   Platelets 304 150 - 400 K/uL   nRBC 0.0 0.0 - 0.2 %    Comment: Performed at [facility], [address] [place], [place] [address]  Glucose, capillary     Status: None   Collection Time: 05/28/24  5:42 AM  Result Value Ref Range   Glucose-Capillary 92 70 - 99 mg/dL    Comment: Glucose reference range applies only to samples taken after fasting for at least 8 hours.  Glucose, capillary     Status: Abnormal   Collection Time: 05/28/24  5:44 AM  Result Value Ref Range   Glucose-Capillary 102 (H) 70 - 99 mg/dL    Comment: Glucose reference range applies only to samples taken after fasting for at least 8 hours.  Glucose, capillary     Status: Abnormal   Collection Time: 05/28/24 11:21 AM  Result Value Ref Range   Glucose-Capillary 130 (H) 70 - 99 mg/dL    Comment: Glucose reference range applies only to samples taken after fasting for at least 8 hours.    No results found.  Review of Systems Blood pressure (!) 155/79, pulse 62, temperature 98.8 F (37.1 C), resp. rate 18, height 5' 11 (1.803 m), weight 85 kg, SpO2  100%. Physical Exam Constitutional:      Appearance: Normal appearance. He is normal weight.  HENT:     Head: Normocephalic.     Nose: Nose normal.  Eyes:     Extraocular Movements: Extraocular movements intact.     Pupils: Pupils are equal, round, and reactive to light.  Pulmonary:     Effort: Pulmonary effort is normal.  Abdominal:     General: Abdomen is flat.     Palpations: Abdomen is soft.  Musculoskeletal:     Cervical back: Normal range of motion and neck supple.  Skin:    General: Skin is warm and dry.     Capillary Refill: Capillary refill takes less than 2 seconds.  Neurological:     Mental Status: He is alert.     Comments: Trace weakness noted to dorsiflexion and plantarflexion bilaterally   Psychiatric:        Mood and Affect: Mood normal.        Behavior: Behavior normal.     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  I do feel this may be resulting in some degree of his bilateral leg weakness, but he is progressing appropriately with therapy, and is pleased with his progress, and he has no significant leg pain.  Given all this, I do not feel there is any current indication for surgical intervention.  I do recommend that he continue the current plan overseen by Dr. Carilyn.  If he were to develop significant pain into the right or left legs, or progressive weakness, an epidural injection can be considered, and potentially surgical intervention, but I feel the it is unlikely these modalities will be needed.  It was my pleasure evaluating him and seeing him again today.  Brealynn Contino L Mailani Degroote 05/28/2024, 11:33 AM

## 2024-05-28 NOTE — Progress Notes (Signed)
 Speech Language Pathology Daily Session Note  Patient Details  Name: [name] [name] MRN: [id number] Date of Birth: [birth year]  Today's Date: 05/28/2024 SLP Individual Time: 1445-1530 SLP Individual Time Calculation (min): 45 min  Short Term Goals: Week 2: SLP Short Term Goal 1 (Week 2): Patient will demonstrate problem solving in mildly complex situations given min multimodal A SLP Short Term Goal 2 (Week 2): Patient will utilize external aids to recall functional daily information with 80% accuracy given min assist. SLP Short Term Goal 3 (Week 2): Pt will process mildly complex information w/ min A SLP Short Term Goal 4 (Week 2): Pt will complete mildly specific word finding tasks w/ minA SLP Short Term Goal 5 (Week 2): Pt will complete verbal organization/thought formulation tasks w/ minA  Skilled Therapeutic Interventions:   Pt greeted at bedside for tx targeting cognition. He benefited from modA to recall therapy tasks and events of the day thus far. He then completed a money management task targeting working memory, information processing, and problem solving. He benefited from maxA overall, however, demonstrated notably improved success w/ written calculations d/t working memory deficits. He demonstrated adequate awareness during task, stating it shouldn't be this hard and I'm not doing good. Positive response was noted to verbal encouragement and education re cognitive domains challenged throughout task. Direct hand off completed w/ PT at the end of tx tasks. Recommend cont ST per POC.   Pain  None reported  Therapy/Group: Individual Therapy  [name] 05/28/2024, 4:34 PM

## 2024-05-28 NOTE — Plan of Care (Signed)
  Problem: Consults Goal: RH GENERAL PATIENT EDUCATION Description: See Patient Education module for education specifics. Outcome: Progressing   Problem: RH BOWEL ELIMINATION Goal: RH STG MANAGE BOWEL WITH ASSISTANCE Description: STG Manage Bowel with mod I Assistance. Outcome: Progressing Flowsheets (Taken 05/28/2024 1559) STG: Pt will manage bowels with assistance: 3-Moderate assistance Goal: RH STG MANAGE BOWEL W/MEDICATION W/ASSISTANCE Description: STG Manage Bowel with Medication with mod I Assistance. Outcome: Progressing   Problem: RH BLADDER ELIMINATION Goal: RH STG MANAGE BLADDER WITH ASSISTANCE Description: STG Manage Bladder With mod I Assistance Outcome: Progressing Flowsheets (Taken 05/28/2024 1559) STG: Pt will manage bladder with assistance: 3-Moderate assistance Goal: RH STG MANAGE BLADDER WITH MEDICATION WITH ASSISTANCE Description: STG Manage Bladder With Medication With Assistance. Outcome: Progressing   Problem: RH SAFETY Goal: RH STG ADHERE TO SAFETY PRECAUTIONS W/ASSISTANCE/DEVICE Description: STG Adhere to Safety Precautions With cues  Assistance/Device. Outcome: Progressing   Problem: RH PAIN MANAGEMENT Goal: RH STG PAIN MANAGED AT OR BELOW PT'S PAIN GOAL Description: Pain < 4 with prns Outcome: Progressing   Problem: Coping: Goal: Ability to adjust to condition or change in health will improve Outcome: Progressing   Problem: Fluid Volume: Goal: Ability to maintain a balanced intake and output will improve Outcome: Progressing Note: Encouraging patient importance of increasing fluid intake. Educated family as well.   Problem: Health Behavior/Discharge Planning: Goal: Ability to identify and utilize available resources and services will improve Outcome: Progressing Goal: Ability to manage health-related needs will improve Outcome: Progressing   Problem: Metabolic: Goal: Ability to maintain appropriate glucose levels will improve Outcome:  Progressing   Problem: Nutritional: Goal: Maintenance of adequate nutrition will improve Outcome: Progressing Goal: Progress toward achieving an optimal weight will improve Outcome: Progressing   Problem: Skin Integrity: Goal: Risk for impaired skin integrity will decrease Outcome: Progressing   Problem: Tissue Perfusion: Goal: Adequacy of tissue perfusion will improve Outcome: Progressing   Problem: RH Vision Goal: RH LTG Vision (Specify) Outcome: Progressing

## 2024-05-28 NOTE — Plan of Care (Signed)
  Problem: Consults Goal: RH GENERAL PATIENT EDUCATION Description: See Patient Education module for education specifics. Outcome: Progressing   Problem: RH BOWEL ELIMINATION Goal: RH STG MANAGE BOWEL WITH ASSISTANCE Description: STG Manage Bowel with mod I Assistance. Outcome: Progressing Goal: RH STG MANAGE BOWEL W/MEDICATION W/ASSISTANCE Description: STG Manage Bowel with Medication with mod I Assistance. Outcome: Progressing   Problem: RH BLADDER ELIMINATION Goal: RH STG MANAGE BLADDER WITH ASSISTANCE Description: STG Manage Bladder With mod I Assistance Outcome: Progressing Goal: RH STG MANAGE BLADDER WITH MEDICATION WITH ASSISTANCE Description: STG Manage Bladder With Medication With Assistance. Outcome: Progressing   Problem: RH SAFETY Goal: RH STG ADHERE TO SAFETY PRECAUTIONS W/ASSISTANCE/DEVICE Description: STG Adhere to Safety Precautions With cues  Assistance/Device. Outcome: Progressing   Problem: RH PAIN MANAGEMENT Goal: RH STG PAIN MANAGED AT OR BELOW PT'S PAIN GOAL Description: Pain < 4 with prns Outcome: Progressing   Problem: RH KNOWLEDGE DEFICIT GENERAL Goal: RH STG INCREASE KNOWLEDGE OF SELF CARE AFTER HOSPITALIZATION Description: Patient and spouse will be able to manage care using educational resources for medications, diet independently Outcome: Progressing   Problem: Education: Goal: Ability to describe self-care measures that may prevent or decrease complications (Diabetes Survival Skills Education) will improve Outcome: Progressing Goal: Individualized Educational Video(s) Outcome: Progressing   Problem: Coping: Goal: Ability to adjust to condition or change in health will improve Outcome: Progressing   Problem: Fluid Volume: Goal: Ability to maintain a balanced intake and output will improve Outcome: Progressing   Problem: Health Behavior/Discharge Planning: Goal: Ability to identify and utilize available resources and services will  improve Outcome: Progressing Goal: Ability to manage health-related needs will improve Outcome: Progressing   Problem: Metabolic: Goal: Ability to maintain appropriate glucose levels will improve Outcome: Progressing   Problem: Nutritional: Goal: Maintenance of adequate nutrition will improve Outcome: Progressing Goal: Progress toward achieving an optimal weight will improve Outcome: Progressing   Problem: Skin Integrity: Goal: Risk for impaired skin integrity will decrease Outcome: Progressing   Problem: Tissue Perfusion: Goal: Adequacy of tissue perfusion will improve Outcome: Progressing   Problem: RH Vision Goal: RH LTG Vision (Specify) Outcome: Progressing

## 2024-05-28 NOTE — Progress Notes (Addendum)
 PROGRESS NOTE   Subjective/Complaints:  No orthostatic drops Pt with vascular dementia fluctuating mental status  Discussed d/c date ROS-   Pt denies SOB, abd pain, CP, N/V/C/D   Objective:  MRI LUMBAR SPINE WITHOUT CONTRAST   TECHNIQUE: Multiplanar, multisequence MR imaging of the lumbar spine was performed. No intravenous contrast was administered.   COMPARISON:  MRI of the lumbar spine March 06, 2018 and January 23, 2013   FINDINGS: Segmentation: A transitional lumbosacral vertebra is assumed to represent the L5 level. Careful correlation with this numbering strategy prior to any procedural intervention would be recommended.   Alignment:  Physiologic.   Vertebrae: Interbody fusion and posterior left transpedicular fixation is noted at L3-4 with laminectomy at L4-5. Degenerative endplate changes are noted at L3-4. Marrow signal characteristics are otherwise maintained.   Conus medullaris and cauda equina: Conus extends to the T12-L1 level. Conus appear normal. Redundancy of nerve roots at the level of L1 is related to L2-3 spinal canal stenosis.   Paraspinal and other soft tissues: Well-defined oval lesion located to the left of midline in the prevertebral soft tissue at the level of L5-S1 is seen in the sagittal views. The lesion as predominantly hypointense signal on T1 and heterogeneous signal on T2. When compared to prior study, the lesion appears with similar size (3.7 x 2.7 cm) when compared to study performed 2019 but enlarged when compared to MRI performed 2014. T2 signal appear changed when compared to prior study.   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   Right renal cysts.   Disc levels:   T12-L1: No spinal canal or neural foraminal stenosis.   L1-2: No spinal canal or neural foraminal stenosis.   L2-3: Disc bulge with superimposed tiny right central  disc protrusion, facet degenerative changes and ligamentum flavum redundancy resulting in moderate to severe spinal canal stenosis, moderate right and severe left neural foraminal stenosis.   L3-4: Laminectomy with facet fusion. No spinal canal or neural foraminal stenosis.   L4-5: Disc bulge with tiny superimposed central disc protrusion and mild facet degenerative changes result in severe bilateral neural foraminal narrowing. Laminectomy is also noted with no spinal canal stenosis.   L5-S1: No spinal canal or neural foraminal stenosis.   Compared to prior study, there has been significant progression of the degree of spinal canal stenosis at L2-3.   IMPRESSION: 1. 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 This has progressed when compared to prior MRI. 2. Well-defined oval lesion the prevertebral soft tissues to the left of midline at the L5-S1 level, only seen on the sagittal images. This appear with similar size although slightly different T2 signal when compared to study performed 2019 but enlarged when compared to study performed in 2014. 3. 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 Recommend followup by ultrasound in 2 years. This recommendation follows ACR consensus guidelines: White Paper of the ACR Incidental Findings Committee II on Vascular Findings. J Am Coll Radiol 2013; 10:     Electronically Signed   By:  [name]  De Macedo [name] M.D.   On: 10/22/2019 14:06 No results found.  Recent Labs    05/27/24 1523 05/28/24 0522  WBC 20.4* 11.7*  HGB 10.1* 9.1*  HCT 32.5* 28.4*  PLT 429* 304    Recent Labs    05/27/24 1523 05/28/24 0522  NA 138 142  K 4.0 3.8  CL 105 108  CO2 19* 23  GLUCOSE 148* 111*  BUN 24* 23  CREATININE 1.75* 1.70*  CALCIUM  9.3 8.7*      Intake/Output Summary (Last 24 hours) at 05/28/2024 0753 Last data filed at 05/27/2024 1755 Gross per 24 hour  Intake 100 ml  Output --  Net 100 ml        Physical Exam: Vital Signs Blood pressure (!) 155/79, pulse 62, temperature 98.8 F (37.1 C), resp. rate 18, height 5' 11 (1.803 m), weight 85 kg, SpO2 100%.       General: No acute distress Mood and affect are appropriate Heart: Regular rate and rhythm no rubs murmurs or extra sounds Lungs: Clear to auscultation, breathing unlabored, no rales or wheezes Abdomen: Positive bowel sounds, soft nontender to palpation, nondistended Extremities: No clubbing, cyanosis, or edema Skin: No evidence of breakdown, no evidence of rash, IV infiltration with RUE forearm but not hand swelling , no erythema or tenderness  Neurologic: Cranial nerves II through XII intact, motor strength is 5/5 in bilateral deltoid, bicep, tricep, grip,3-/5 R RIght and 2-/5 left  hip flexor, 4- Right and 3- left knee extensors, 5/5 RIght and 4/5 left ankle dorsiflexor and plantar flexor   Musculoskeletal: Full range of motion in all 4 extremities. No joint swelling Oriented to [name] [name] and place but not time, delayed responses   Assessment/Plan: 1. Functional deficits which require 3+ hours per day of interdisciplinary therapy in a comprehensive inpatient rehab setting. Physiatrist is providing close team supervision and 24 hour management of active medical problems listed below. Physiatrist and rehab team continue to assess barriers to discharge/monitor patient progress toward functional and medical goals  Care Tool:  Bathing    Body parts bathed by patient: Right arm, Left arm, Chest, Front perineal area, Abdomen, Right upper leg, Left upper leg, Face   Body parts bathed by helper: Buttocks, Right lower leg, Left lower leg     Bathing assist Assist Level: Moderate Assistance - Patient 50 - 74%     Upper Body Dressing/Undressing Upper body  dressing   What is the patient wearing?: Pull over shirt    Upper body assist Assist Level: Set up assist    Lower Body Dressing/Undressing Lower body dressing      What is the patient wearing?: Pants     Lower body assist Assist for lower body dressing: Maximal Assistance - Patient 25 - 49%     Toileting Toileting    Toileting assist Assist for toileting: Moderate Assistance - Patient 50 - 74%     Transfers Chair/bed transfer  Transfers assist     Chair/bed transfer assist level: Moderate Assistance - Patient 50 - 74%     Locomotion Ambulation   Ambulation assist   Ambulation activity did not occur: Safety/medical concerns  Assist level: Minimal Assistance - Patient > 75% Assistive device: Walker-rolling Max distance: 10'   Walk 10 feet activity   Assist  Walk 10 feet activity did not occur: Safety/medical concerns  Assist level: Minimal Assistance - Patient > 75% Assistive device: Walker-rolling   Walk 50 feet activity   Assist Walk 50 feet  with 2 turns activity did not occur: Safety/medical concerns         Walk 150 feet activity   Assist Walk 150 feet activity did not occur: Safety/medical concerns         Walk 10 feet on uneven surface  activity   Assist Walk 10 feet on uneven surfaces activity did not occur: Safety/medical concerns         Wheelchair     Assist Is the patient using a wheelchair?: Yes Type of Wheelchair: Manual    Wheelchair assist level: Supervision/Verbal cueing Max wheelchair distance: 100'    Wheelchair 50 feet with 2 turns activity    Assist        Assist Level: Supervision/Verbal cueing   Wheelchair 150 feet activity     Assist      Assist Level: Minimal Assistance - Patient > 75%   Blood pressure (!) 155/79, pulse 62, temperature 98.8 F (37.1 C), resp. rate 18, height 5' 11 (1.803 m), weight 85 kg, SpO2 100%.  Medical Problem List and Plan: 1. Functional deficits  secondary to right cerebellar small deep infarct             -patient may shower             -ELOS/Goals: 06/09/24 S/MinA   Con't CIR PT, OT and SLP 2.  Antithrombotics: -DVT/anticoagulation:  Pharmaceutical: Lovenox , age indeterminate asymptomatic Left Fem DVT, per Dr [name] [name], cont current meds, no activity restriction              -antiplatelet therapy: continue ASA   3. Pain Management: continue Oxycodone  prn for severe and tylenol  prn for mild pain   10/4-10/5- denies pain at all this AM- con't regimen 4. Mood/Behavior/Sleep: LCSW to follow for evaluation and support.              -antipsychotic agents: N/A   5. Neuropsych/cognition: This patient is not capable of making decisions on his own behalf. 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 Skin/Wound Care: Routine pressure relief measures. Monitor incision for healing Right antecubital IV infiltration    7. Fluids/Electrolytes/Nutrition:  Monitor I/O. Check CMET in am             --offer supplement prn   8. T2DM: Monitor BS ac/hs and use SSI for elevated BS             --Was on metform 500 mg bid--held due to dye study, consider resuming once kidney function improves   10/4- BG's running 100-118- con't to monitor 9.HTN: Monitor BP TID--off home meds at this time. BP reviewed and is starting to increase, will start amlodipine  2.5mg  daily Vitals:   05/27/24 1918 05/28/24 0436  BP: 126/80 (!) 155/79  Pulse: 88 62  Resp: 18 18  Temp: 98.6 F (37 C) 98.8 F (37.1 C)  SpO2: 99% 100%  Increase amlodipine  to 10mg  daily - sys elevation mild    10. AKI: BUN/SCr 11/1.21 at admission with rise to 19/1.54 post procedure                 Latest Ref Rng & Units 05/28/2024    5:22 AM 05/27/2024    3:23 PM 05/25/2024    6:59 AM  BMP  Glucose 70 - 99 mg/dL 888  851  898   BUN 8 - 23 mg/dL 23  24  17    Creatinine 0.61 - 1.24 mg/dL  1.70  1.75  1.43   Sodium  135 - 145 mmol/L 142  138  141   Potassium 3.5 - 5.1 mmol/L 3.8  4.0  4.2   Chloride 98 - 111 mmol/L 108  105  109   CO2 22 - 32 mmol/L 23  19  24    Calcium  8.9 - 10.3 mg/dL 8.7  9.3  8.8     Labs stable , no orthostatic hypotension  d/c IVF after current bag 11. Thrombocytopenia: resolved now in high normal range   12.  ABLA: Recheck stable no hematuria, melena noted, cont to monitor at least weekly - stable     Latest Ref Rng & Units 05/28/2024    5:22 AM 05/27/2024    3:23 PM 05/25/2024    6:59 AM  CBC  WBC 4.0 - 10.5 K/uL 11.7  20.4  7.0   Hemoglobin 13.0 - 17.0 g/dL 9.1  89.8  9.1   Hematocrit 39.0 - 52.0 % 28.4  32.5  28.8   Platelets 150 - 400 K/uL 304  429  401    stable   13. Leucocytosis: resolved    14.  H/o dementia vascular: continue Namenda .  Has been seeing neuro as outpt   15.  Bilateral LE weakness proximally and Left distal LE weakness with hx Lumbar stenosis , suspect progression since prior Lumbar MRI in 2021, pt has had L3-4 fusion per Dr [name] [name] in 2021- some improvement in LE strength , pt feels decline in strength was post op after aneurysm repair, however pt has hx of dementia and is poor historian, had Hospital admit July 2025 and was reportedly using walker at baseline and went to SNF.  Using walker at OV with [name] 04/08/2024 Pt had repeat MRI lumbar showing progressive L2-3 central stenosis , severe bilateral L4-5 foraminal stenosis, intrathecal T1 signal at L5-S1 Discussed case with Dr [name] [name].  Will need f/u with Dr [name] [name] post discharge    LOS: 11 days A FACE TO FACE EVALUATION WAS PERFORMED  [name] [name] 05/28/2024, 7:53 AM

## 2024-05-28 NOTE — Patient Care Conference (Signed)
 Inpatient RehabilitationTeam Conference and Plan of Care Update Date: 05/27/2024   Time: 10:40 AM    Patient Name: [name] [name]      Medical Record Number: [id number]  Date of Birth: 09/15/1945 Sex: Male         Room/Bed: 4M08C/4M08C-01 Payor Info: Payor: Multimedia programmer / Plan: [employer] MEDICARE / Product Type: *No Product type* /    Admit Date/Time:  05/17/2024  5:41 PM  Primary Diagnosis:  Cerebral infarction involving right cerebellar artery [facility])  Hospital Problems: Principal Problem:   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) Active Problems:   Stroke (cerebrum) [facility])    Expected Discharge Date: Expected Discharge Date: 06/09/24  Team Members Present: Physician leading conference: Dr. Prentice Compton Social Worker Present: Rhoda Clement, LCSW Nurse Present: Barnie Ronde, RN PT Present: Recardo Milliner, PT OT Present: Katheryn Mines, OT SLP Present: Recardo Mole, SLP     Current Status/Progress Goal Weekly Team Focus  Bowel/Bladder   Incontinent of b/b   Regain continence   Assist with time toileting q 2-4 and prn    Swallow/Nutrition/ Hydration               ADL's   set-up oral care, MIN UB ADL, MAX LB ADL, MOD bathing, MIN stnad pivot with RW (multimodal cues for weightshifitng, techniques, RW management and truk positioning) // Barriers: functional cognition, LB strength, boday awareness, postural strength   CGA overall   ADL retraining, cognitive training, balance, functional mobility, endurance, Pt/fam education    Mobility   Bed mobility = Min/ ModA, transfers = Mod improving toward MinA, ambulation = very short distances, MinA using RW otherwise ModA for longer than 31ft, w/c mobility = CGA/ supervision   supervision/ CGA for bed mobility and transfers, MinA for ambulation, supervision for w/c mobility  Barriers: generalized weakness as well as L hemipareisis, cognition /// Work on: general and L sided strengthening, LOA in all  mobility, quality of gait, standing balance and posture, family education    Communication                Safety/Cognition/ Behavioral Observations  moderate cogntiive deficits in areas of memory, thought organization, problem solving, attention, and word finding   supervision- min A   cognitive re- training, compensatory memory strategies, pt/ family education    Pain   No c/o pain   <3/10 pain score   Assess qshift and prn    Skin   Skin intact   Maintain skin integrity  Assess qshift and prn      Discharge Planning:  Wife has been here and observed in therapies, she continues to want to take home, although he will be physical care. Will discuss again if wife able to take husband home from here.   Team Discussion: Patient admitted post right cerebellar CVA with SVD and white matter disease/vascular dementia (moderately severe cognitive deficits) with proximal weakness, and double vision. Functional status varies with fatigue and poor attention, posterior lean and poor attention with spinal stenosis/back pain.   Patient on target to meet rehab goals: yes, currently needs supervision for upper body care (seated) and mod assist for lower body care with max assist for toileting.  Needs mod assist for stand pivot transfers due to balance issues and sequencing deficits, poor insight, poor body awareness and knee buckling.  *See Care Plan and progress notes for long and short-term goals.   Revisions to Treatment Plan:  Downgraded goals for discharge   Teaching Needs:  Safety, medications, transfers,toileting, etc.   Current Barriers to Discharge: Decreased caregiver support  Possible Resolutions to Barriers: Family education     Medical Summary Current Status: L>R LE weakness , some chronic weakness using walker prior to thoracic aneurysm repair, hx Lumbar spinal stenosis at L2-3  Barriers to Discharge: Medical stability   Possible Resolutions to [employer] Focus:  Ortho consultation and f/u regarding lumbar stenosis at L2-3   Continued Need for Acute Rehabilitation Level of Care: The patient requires daily medical management by a physician with specialized training in physical medicine and rehabilitation for the following reasons: Direction of a multidisciplinary physical rehabilitation program to maximize functional independence : Yes Medical management of patient stability for increased activity during participation in an intensive rehabilitation regime.: Yes Analysis of laboratory values and/or radiology reports with any subsequent need for medication adjustment and/or medical intervention. : Yes   I attest that I was present, lead the team conference, and concur with the assessment and plan of the team.   [name] 05/28/2024, 11:28 AM

## 2024-05-28 NOTE — Progress Notes (Signed)
 Occupational Therapy Session Note  Patient Details  Name: Collis Thede Shrider MRN: [id number] Date of Birth: Mar 21, 1946  Today's Date: 05/28/2024 OT Individual Time: 9152-9040 OT Individual Time Calculation (min): 72 min    Short Term Goals: Week 2:  OT Short Term Goal 1 (Week 2): Pt will complete toileting with MOD A using LRAD OT Short Term Goal 2 (Week 2): Pt will complete toilet transfers with MIN A using LRAD OT Short Term Goal 3 (Week 2): Pt will tolerate standing using LRAD for 2 min during functional activity or ADLs OT Short Term Goal 4 (Week 2): Pt will complete LB dressing with MOD A using LRAD  Skilled Therapeutic Interventions/Progress Updates:    Pt received using bathroom presenting to be in good spirits receptive to skilled OT session reporting 0/10 pain- OT offering intermittent rest breaks, repositioning, and therapeutic support to optimize participation in therapy session. Focused this morning on BADL retraining. Completed multiple sit<>stands from toilet utilizing STEDY with MAX A required to power up from seated position. Completed posterior peri care with MAX A while standing utilizing STEDY with multiple rest breaks required d/t fatigue. Transported Pt to shower via STEDY and MAX A from STEDY>TTB. Pt completed U/LB bathing while seated on TTB utilizing long handled sponge with CGA for safety and MAX A for standing when completing posterior peri care. Pt requested to go to the bathroom upon finishing shower. MAX A sit>stand TTB>STEDY. Transported Pt to BSC over toilet, minimal void of BM, MAX A for posterior peri care and standing utilizing STEDY with multiple rest breaks provided d/t fatigue. Transported Pt to WC via STEDY, MAX A STEDY>WC transfer. Pt completed UB dressing with MIN A, LB dressing utilizing reacher with MIN A following education on technique and MOD verbal cues, and donned socks and shoes with MAX A d/t time management. Completed oral care while seated in WC at the  sink with SUP- intermittent MIN A. Pt was passed off to nursing to finish oral care and all needs met.    Therapy Documentation Precautions:  Precautions Precautions: Fall Recall of Precautions/Restrictions: Impaired Precaution/Restrictions Comments: incision near L clavicle (axillary artery) and bil groins; L weakness Restrictions Weight Bearing Restrictions Per Provider Order: No    Therapy/Group: Individual Therapy  Ashrita Chrismer Van Schaick 05/28/2024, 12:46 PM

## 2024-05-29 LAB — GLUCOSE, CAPILLARY
Glucose-Capillary: 114 mg/dL — ABNORMAL HIGH (ref 70–99)
Glucose-Capillary: 156 mg/dL — ABNORMAL HIGH (ref 70–99)
Glucose-Capillary: 146 mg/dL — ABNORMAL HIGH (ref 70–99)
Glucose-Capillary: 142 mg/dL — ABNORMAL HIGH (ref 70–99)

## 2024-05-29 MED ORDER — BOOST PLUS PO LIQD
237.0000 mL | Freq: Three times a day (TID) | ORAL | Status: DC
  Administered 2024-05-29 – 2024-06-09 (×23): 237 mL via ORAL
  Filled 2024-05-29 (×35): qty 237

## 2024-05-29 NOTE — Progress Notes (Signed)
 Speech Language Pathology Daily Session Note  Patient Details  Name: [name] [name] MRN: [id number] Date of Birth: 12-25-1945  Today's Date: 05/29/2024 SLP Individual Time: 1000-1100 SLP Individual Time Calculation (min): 60 min  Short Term Goals: Week 2: SLP Short Term Goal 1 (Week 2): Patient will demonstrate problem solving in mildly complex situations given min multimodal A SLP Short Term Goal 2 (Week 2): Patient will utilize external aids to recall functional daily information with 80% accuracy given min assist. SLP Short Term Goal 3 (Week 2): Pt will process mildly complex information w/ min A SLP Short Term Goal 4 (Week 2): Pt will complete mildly specific word finding tasks w/ minA SLP Short Term Goal 5 (Week 2): Pt will complete verbal organization/thought formulation tasks w/ minA  Skilled Therapeutic Interventions:   Pt greeted in his room for tx targeting cognition. He was very pleasant and cooperative throughout. SLP reviewed events of the day thus far and pt was able to utilize his memory book w/ minA to assist w/ accuracy of details. SLP then facilitated decoding task targeting sustained attention, information processing, organization, and working memory. He completed the task @ modI. He benefited from supervisionA for information processing and specific word finding during written task filling in missing letters to complete words. He requested assistance to the bathroom so SLP and RN assisted him via stedy. He was incontinent of bladder and benefited from [facility] for sequencing and problem solving throughout peri care and lower body dressing. Overall, he demonstrated improved attention and alertness as compared to prev 2 tx sessions. Anticipate morning session vs late afternoon assisted w/ this. At the end of tx tasks, he was left in his [name] w/ the alarm set and call light within reach. Recommend cont ST per POC.   Pain  None reported  Therapy/Group: Individual Therapy  [name] 05/29/2024, 10:44 AM

## 2024-05-29 NOTE — Progress Notes (Signed)
 Physical Therapy Session Note  Patient Details  Name: [name] [name] MRN: [id number] Date of Birth: 22-Mar-1946  Today's Date: 05/29/2024 PT Individual Time: 1106-1203 PT Individual Time Calculation (min): 57 min   Short Term Goals: Week 1:  PT Short Term Goal 1 (Week 1): Pt will be able to perform bed mobility with mod A PT Short Term Goal 1 - Progress (Week 1): Met PT Short Term Goal 2 (Week 1): Pt will be able to perform transfers bed <> w/c with mod A PT Short Term Goal 2 - Progress (Week 1): Met PT Short Term Goal 3 (Week 1): Pt will initiate gait PT Short Term Goal 3 - Progress (Week 1): Met PT Short Term Goal 4 (Week 1): Pt will demonstrate dynamic sitting balance with min A PT Short Term Goal 4 - Progress (Week 1): Met Week 2:  PT Short Term Goal 1 (Week 2): Pt will be able to perform bed mobility with consistent MinA PT Short Term Goal 2 (Week 2): Pt will be able to perform transfers bed <> w/c with consistent Min A PT Short Term Goal 3 (Week 2): Pt will demonstrate dynamic sitting balance with CGA PT Short Term Goal 4 (Week 2): Pt will ambulate >50 ft consistently with CGA/ MinA.   Skilled Therapeutic Interventions/Progress Updates:  Patient seated upright in w/c on entrance to room. Patient alert and agreeable to PT session.   Patient with no pain complaint at start of session.  Therapeutic Activity: Transfers: Pt performed sit<>stand and stand pivot transfers throughout session with Min/ ModA for power up initially and improving to CGA/ MinA by end of NMR as described below. Provided vc/ tc for technique throughout.  Neuromuscular Re-ed: NMR facilitated during session with focus on standing balance, improving anterior weight shift/ proprioception. Pt guided in reaching modified quadruped position with hands on elevated mat table. Guided in forward reciprocation of hands with focus on weight shift from heels to toes in order to improve pt's understanding of forward weight  shifting during stance.   Pt then guided in blocked practice of sequencing of rise to stand and then controlled return to sit: Forward scoot Posterior foot placement Hands positioned on armrests Sitting with head/ shoulders high and chest out Hinging forward as far as he can and pushing self FORWARD with hands  Pushing feet hard down through floor Reaching one hand at a time to RW  Pt continues to require vc for each step of sequencing d/t reduced memory. Improves from Mod/ MinA to CGA/ MinA for powerup and maintaining balance in upright stance. Also improves in controlling descent to sit by controlling descent through 50% of movement to 80% of movement d/t continued posterior bias.  NMR performed for improvements in motor control and coordination, balance, sequencing, judgement, and self confidence/ efficacy in performing all aspects of mobility at highest level of independence.   Gait Training:  Pt ambulated 87' x1 ft using RW with CGA and close w/c follow for fatigue. Demonstrated trendelenburg gait pattern with increased lateral pelvic displacement to L. Also demos ability to improve posture with stance over RLE but continued bodily flexion with stance over LLE demonstrating continued L hemibody weakness.  Provided vc/ tc for maintaining upright posture throughout.  Patient seated upright in w/c at end of session with brakes locked, belt alarm set, and all needs within reach. Wife and dtr in room.    Therapy Documentation Precautions:  Precautions Precautions: Fall Recall of Precautions/Restrictions: Impaired Precaution/Restrictions Comments: incision near  L clavicle (axillary artery) and bil groins; L weakness Restrictions Weight Bearing Restrictions Per Provider Order: No  Pain: No pain related this therapy session.   Therapy/Group: Individual Therapy  [name] PT, DPT, CSRS 05/29/2024, 4:17 PM

## 2024-05-29 NOTE — Progress Notes (Signed)
 PROGRESS NOTE   Subjective/Complaints:  No issues overnite , pt does not remember day of week, remembered name of surgeon who saw him yesterday , requiring cuing  ROS-   Pt denies SOB, abd pain, CP, N/V/C/D   Objective:  MRI LUMBAR SPINE WITHOUT CONTRAST   TECHNIQUE: Multiplanar, multisequence MR imaging of the lumbar spine was performed. No intravenous contrast was administered.   COMPARISON:  MRI of the lumbar spine March 06, 2018 and January 23, 2013   FINDINGS: Segmentation: A transitional lumbosacral vertebra is assumed to represent the L5 level. Careful correlation with this numbering strategy prior to any procedural intervention would be recommended.   Alignment:  Physiologic.   Vertebrae: Interbody fusion and posterior left transpedicular fixation is noted at L3-4 with laminectomy at L4-5. Degenerative endplate changes are noted at L3-4. Marrow signal characteristics are otherwise maintained.   Conus medullaris and cauda equina: Conus extends to the T12-L1 level. Conus appear normal. Redundancy of nerve roots at the level of L1 is related to L2-3 spinal canal stenosis.   Paraspinal and other soft tissues: Well-defined oval lesion located to the left of midline in the prevertebral soft tissue at the level of L5-S1 is seen in the sagittal views. The lesion as predominantly hypointense signal on T1 and heterogeneous signal on T2. When compared to prior study, the lesion appears with similar size (3.7 x 2.7 cm) when compared to study performed 2019 but enlarged when compared to MRI performed 2014. T2 signal appear changed when compared to prior study.   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   Right renal cysts.   Disc levels:   T12-L1: No spinal canal or neural foraminal stenosis.   L1-2: No spinal canal or neural foraminal stenosis.   L2-3: Disc bulge with superimposed  tiny right central disc protrusion, facet degenerative changes and ligamentum flavum redundancy resulting in moderate to severe spinal canal stenosis, moderate right and severe left neural foraminal stenosis.   L3-4: Laminectomy with facet fusion. No spinal canal or neural foraminal stenosis.   L4-5: Disc bulge with tiny superimposed central disc protrusion and mild facet degenerative changes result in severe bilateral neural foraminal narrowing. Laminectomy is also noted with no spinal canal stenosis.   L5-S1: No spinal canal or neural foraminal stenosis.   Compared to prior study, there has been significant progression of the degree of spinal canal stenosis at L2-3.   IMPRESSION: 1. 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 This has progressed when compared to prior MRI. 2. Well-defined oval lesion the prevertebral soft tissues to the left of midline at the L5-S1 level, only seen on the sagittal images. This appear with similar size although slightly different T2 signal when compared to study performed 2019 but enlarged when compared to study performed in 2014. 3. 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 Recommend followup by ultrasound in 2 years. This recommendation follows ACR consensus guidelines: White Paper of the ACR Incidental Findings Committee II on Vascular Findings. J Am Coll Radiol 2013; 10:  Electronically Signed   By: [name] M.D.   On: 10/22/2019 14:06 No results found.  Recent Labs    05/27/24 1523 05/28/24 0522  WBC 20.4* 11.7*  HGB 10.1* 9.1*  HCT 32.5* 28.4*  PLT 429* 304    Recent Labs    05/27/24 1523 05/28/24 0522  NA 138 142  K 4.0 3.8  CL 105 108  CO2 19* 23  GLUCOSE 148* 111*  BUN 24* 23  CREATININE 1.75* 1.70*  CALCIUM  9.3  8.7*     Intake/Output Summary (Last 24 hours) at 05/29/2024 0759 Last data filed at 05/28/2024 1855 Gross per 24 hour  Intake 1949.89 ml  Output --  Net 1949.89 ml        Physical Exam: Vital Signs Blood pressure (!) 160/88, pulse 78, temperature 99.6 F (37.6 C), temperature source Oral, resp. rate 16, height 5' 11 (1.803 m), weight 85 kg, SpO2 99%.   General: No acute distress Mood and affect are appropriate Heart: Regular rate and rhythm no rubs murmurs or extra sounds Lungs: Clear to auscultation, breathing unlabored, no rales or wheezes Abdomen: Positive bowel sounds, soft nontender to palpation, nondistended Extremities: No clubbing, cyanosis, or edema Skin: No evidence of breakdown, no evidence of rash, IV infiltration with RUE forearm but not hand swelling , no erythema or tenderness  Neurologic: Cranial nerves II through XII intact, motor strength is 5/5 in bilateral deltoid, bicep, tricep, grip,3-/5 R RIght and 2-/5 left  hip flexor, 4- Right and 3- left knee extensors, 5/5 RIght and 4/5 left ankle dorsiflexor and plantar flexor   Musculoskeletal: Full range of motion in all 4 extremities. No joint swelling Oriented to [place] and place but not time, delayed responses   Assessment/Plan: 1. Functional deficits which require 3+ hours per day of interdisciplinary therapy in a comprehensive inpatient rehab setting. Physiatrist is providing close team supervision and 24 hour management of active medical problems listed below. Physiatrist and rehab team continue to assess barriers to discharge/monitor patient progress toward functional and medical goals  Care Tool:  Bathing    Body parts bathed by patient: Right arm, Left arm, Chest, Front perineal area, Abdomen, Right upper leg, Left upper leg, Face   Body parts bathed by helper: Buttocks, Right lower leg, Left lower leg     Bathing assist Assist Level: Moderate Assistance - Patient 50 - 74%     Upper Body  Dressing/Undressing Upper body dressing   What is the patient wearing?: Pull over shirt    Upper body assist Assist Level: Set up assist    Lower Body Dressing/Undressing Lower body dressing      What is the patient wearing?: Pants     Lower body assist Assist for lower body dressing: Maximal Assistance - Patient 25 - 49%     Toileting Toileting    Toileting assist Assist for toileting: Moderate Assistance - Patient 50 - 74%     Transfers Chair/bed transfer  Transfers assist     Chair/bed transfer assist level: Moderate Assistance - Patient 50 - 74%     Locomotion Ambulation   Ambulation assist   Ambulation activity did not occur: Safety/medical concerns  Assist level: Minimal Assistance - Patient > 75% Assistive device: Walker-rolling Max distance: 10'   Walk 10 feet activity   Assist  Walk 10 feet activity did not occur: Safety/medical concerns  Assist level: Minimal Assistance - Patient > 75% Assistive device: Walker-rolling   Walk 50 feet activity  Assist Walk 50 feet with 2 turns activity did not occur: Safety/medical concerns         Walk 150 feet activity   Assist Walk 150 feet activity did not occur: Safety/medical concerns         Walk 10 feet on uneven surface  activity   Assist Walk 10 feet on uneven surfaces activity did not occur: Safety/medical concerns         Wheelchair     Assist Is the patient using a wheelchair?: Yes Type of Wheelchair: Manual    Wheelchair assist level: Supervision/Verbal cueing Max wheelchair distance: 100'    Wheelchair 50 feet with 2 turns activity    Assist        Assist Level: Supervision/Verbal cueing   Wheelchair 150 feet activity     Assist      Assist Level: Minimal Assistance - Patient > 75%   Blood pressure (!) 160/88, pulse 78, temperature 99.6 F (37.6 C), temperature source Oral, resp. rate 16, height 5' 11 (1.803 m), weight 85 kg, SpO2  99%.  Medical Problem List and Plan: 1. 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  Antithrombotics: -DVT/anticoagulation:  Pharmaceutical: Lovenox , age indeterminate asymptomatic Left Fem DVT, per Dr Sheree [facility], cont current meds, no activity restriction              -antiplatelet therapy: continue ASA   3. Pain Management: continue Oxycodone  prn for severe and tylenol  prn for mild pain   10/4-10/5- denies pain at all this AM- con't regimen 4. Mood/Behavior/Sleep: LCSW to follow for evaluation and support.              -antipsychotic agents: N/A   5. Neuropsych/cognition: This patient is not capable of making decisions on his own behalf. 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 Skin/Wound Care: Routine pressure relief measures. Monitor incision for healing Right antecubital IV infiltration    7. Fluids/Electrolytes/Nutrition:  Monitor I/O. Check CMET in am             --offer supplement prn   8. T2DM: Monitor BS ac/hs and use SSI for elevated BS             --Was on metform 500 mg bid--held due to dye study, consider resuming once kidney function improves   10/4- BG's running 100-118- con't to monitor 9.HTN: Monitor BP TID--off home meds at this time. BP reviewed and is starting to increase, will start amlodipine  2.5mg  daily Vitals:   05/28/24 1709 05/29/24 0444  BP: 136/79 (!) 160/88  Pulse:  78  Resp:  16  Temp:  99.6 F (37.6 C)  SpO2:  99%  Increase amlodipine  to 10mg  daily - sys elevation mild    10. AKI: BUN/SCr 11/1.21 at admission with rise to 19/1.54 post procedure                 Latest Ref Rng & Units 05/28/2024    5:22 AM 05/27/2024    3:23 PM 05/25/2024    6:59 AM  BMP  Glucose 70 - 99 mg/dL 888  851  898   BUN 8 - 23 mg/dL 23  24  17    Creatinine  0.61 -  1.24 mg/dL 8.29  8.24  8.56   Sodium 135 - 145 mmol/L 142  138  141   Potassium 3.5 - 5.1 mmol/L 3.8  4.0  4.2   Chloride 98 - 111 mmol/L 108  105  109   CO2 22 - 32 mmol/L 23  19  24    Calcium  8.9 - 10.3 mg/dL 8.7  9.3  8.8    Recheck 10/13 Labs stable , no orthostatic hypotension  d/c IVF after current bag 11. Thrombocytopenia: resolved now in high normal range   12.  ABLA: Recheck stable no hematuria, melena noted, cont to monitor at least weekly - stable     Latest Ref Rng & Units 05/28/2024    5:22 AM 05/27/2024    3:23 PM 05/25/2024    6:59 AM  CBC  WBC 4.0 - 10.5 K/uL 11.7  20.4  7.0   Hemoglobin 13.0 - 17.0 g/dL 9.1  89.8  9.1   Hematocrit 39.0 - 52.0 % 28.4  32.5  28.8   Platelets 150 - 400 K/uL 304  429  401   Dropped a point after IVF, recheck Monday , no overt signs of bleeding, asymptomatic   13. Leucocytosis: resolved    14.  H/o dementia vascular: continue Namenda .  Has been seeing neuro as outpt   15.  Bilateral LE weakness proximally and Left distal LE weakness with hx Lumbar stenosis , suspect progression since prior Lumbar MRI in 2021, pt has had L3-4 fusion per Dr Beuford in 2021- some improvement in LE strength , pt feels decline in strength was post op after aneurysm repair, however pt has hx of dementia and is poor historian, had Hospital admit July 2025 and was reportedly using walker at baseline and went to SNF.  Using walker at OV with [facility] 04/08/2024 Pt had repeat MRI lumbar showing progressive L2-3 central stenosis , severe bilateral L4-5 foraminal stenosis, intrathecal T1 signal at L5-S1 Discussed case with Dr Beuford.  Will need f/u with Dr Beuford post discharge    LOS: 12 days A FACE TO FACE EVALUATION WAS PERFORMED  [name] [name] 05/29/2024, 7:59 AM

## 2024-05-29 NOTE — Progress Notes (Signed)
 Occupational Therapy Session Note  Patient Details  Name: [name] [name] MRN: [id number] Date of Birth: [birth year]  Session 1: Today's Date: 05/29/2024 OT Individual Time: 9168-9099 OT Individual Time Calculation (min): 29 min   Session 2: Today's Date: 05/29/2024 OT Individual Time: 8694-8585 OT Individual Time Calculation (min): 69 min   Short Term Goals: Week 2:  OT Short Term Goal 1 (Week 2): Pt will complete toileting with MOD A using LRAD OT Short Term Goal 2 (Week 2): Pt will complete toilet transfers with MIN A using LRAD OT Short Term Goal 3 (Week 2): Pt will tolerate standing using LRAD for 2 min during functional activity or ADLs OT Short Term Goal 4 (Week 2): Pt will complete LB dressing with MOD A using LRAD  Skilled Therapeutic Interventions/Progress Updates:    Session 1: Pt received resting in bed presenting to be in good spirits receptive to skilled OT session reporting 0/10 pain- OT offering intermittent rest breaks, repositioning, and therapeutic support to optimize participation in therapy session. Focused this session on morning routine, including dressing and oral care.  Supine>EOB with MIN A and increased time. Completed U/LB dressing while seated EOB with MIN A - intermittent MOD A for sitting balance, MIN A for UB dressing, and LB dressing utilizing reacher with MIN A and MOD verbal cues, and donned shoes with MAX A d/t time management. Completed oral care while seated in WC at the sink with SUP. Engaged Pt in completing memory notebook for session, requiring Pt to recall tasks completed in session with MIN questioning cues and increased time. Pt was left resting in WC with call bell in reach, seatbelt alarm on, and all needs met.   Session 2: Pt received resting in [facility] with wife present. Pt presenting to be in good spirits receptive to skilled OT session reporting 0/10 pain- OT offering intermittent rest breaks, repositioning, and therapeutic support to optimize  participation in therapy session. Focused this session on standing endurance, body positioning, and strengthening. Transported Pt to rehab gym via [facility] d/t time management. Completed 10 mins of arm bike to increase UB strengthening and endurance while seated in WC with forward and backwards motions. Engaged Pt in visual scanning activity on BITS to challenge vision, standing tolerance and functional reaching to tap moving targets on the screen utilizing RW with MIN A- intermittent MOD A when fatigued and MOD tactile cues to facilitate upright posture. Completed 2 trials:  Trial 1:    Trial 2: 81.82% accuracy  89.36% accuracy 1:50 duration   2 min duration 2.94 sec reaction time  2.78 sec reaction time 36 hits    42 hits  Noticeable improvement in standing tolerance and reaction time from trial 1 vs trial 2. Engaged Pt in static standing with RW utilizing mirror to provide visual feedback to facilitate appropriate standing posture and body positioning with MIN A for standing, MOD tactile cues and MIN verbal cues for upright posture. Towards end of activity, Pt was able to correct upright posture through visual feedback. Stand step pivot EOM>WC with MOD A and MOD verbal cues for sequencing. Pt requested to return to room d/t incontinent urine void. Completed stand step pivot WC<>BSC over toilet. Doffed and donned clean brief with MAX A while seated on BSC over toilet and standing to pull over hips d/t time management. Engaged Pt in completing memory notebook for session, requiring Pt to recall tasks completed in session with MIN questioning cues and increased time. Pt was left resting in  WC with call bell in reach, seatbelt alarm on, and all needs met.    Therapy Documentation Precautions:  Precautions Precautions: Fall Recall of Precautions/Restrictions: Impaired Precaution/Restrictions Comments: incision near L clavicle (axillary artery) and bil groins; L weakness Restrictions Weight Bearing  Restrictions Per Provider Order: No  Therapy/Group: Individual Therapy  [name] 05/29/2024, 10:01 AM

## 2024-05-30 LAB — GLUCOSE, CAPILLARY
Glucose-Capillary: 128 mg/dL — ABNORMAL HIGH (ref 70–99)
Glucose-Capillary: 126 mg/dL — ABNORMAL HIGH (ref 70–99)
Glucose-Capillary: 119 mg/dL — ABNORMAL HIGH (ref 70–99)
Glucose-Capillary: 143 mg/dL — ABNORMAL HIGH (ref 70–99)

## 2024-05-30 NOTE — Progress Notes (Addendum)
 PROGRESS NOTE   Subjective/Complaints:  N pain c/os, discussed therapy schedule  ROS-   Pt denies SOB, abd pain, CP, N/V/C/D   Objective:  MRI LUMBAR SPINE WITHOUT CONTRAST   TECHNIQUE: Multiplanar, multisequence MR imaging of the lumbar spine was performed. No intravenous contrast was administered.   COMPARISON:  MRI of the lumbar spine March 06, 2018 and January 23, 2013   FINDINGS: Segmentation: A transitional lumbosacral vertebra is assumed to represent the L5 level. Careful correlation with this numbering strategy prior to any procedural intervention would be recommended.   Alignment:  Physiologic.   Vertebrae: Interbody fusion and posterior left transpedicular fixation is noted at L3-4 with laminectomy at L4-5. Degenerative endplate changes are noted at L3-4. Marrow signal characteristics are otherwise maintained.   Conus medullaris and cauda equina: Conus extends to the T12-L1 level. Conus appear normal. Redundancy of nerve roots at the level of L1 is related to L2-3 spinal canal stenosis.   Paraspinal and other soft tissues: Well-defined oval lesion located to the left of midline in the prevertebral soft tissue at the level of L5-S1 is seen in the sagittal views. The lesion as predominantly hypointense signal on T1 and heterogeneous signal on T2. When compared to prior study, the lesion appears with similar size (3.7 x 2.7 cm) when compared to study performed 2019 but enlarged when compared to MRI performed 2014. T2 signal appear changed when compared to prior study.   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   Right renal cysts.   Disc levels:   T12-L1: No spinal canal or neural foraminal stenosis.   L1-2: No spinal canal or neural foraminal stenosis.   L2-3: Disc bulge with superimposed tiny right central disc protrusion, facet degenerative changes and ligamentum  flavum redundancy resulting in moderate to severe spinal canal stenosis, moderate right and severe left neural foraminal stenosis.   L3-4: Laminectomy with facet fusion. No spinal canal or neural foraminal stenosis.   L4-5: Disc bulge with tiny superimposed central disc protrusion and mild facet degenerative changes result in severe bilateral neural foraminal narrowing. Laminectomy is also noted with no spinal canal stenosis.   L5-S1: No spinal canal or neural foraminal stenosis.   Compared to prior study, there has been significant progression of the degree of spinal canal stenosis at L2-3.   IMPRESSION: 1. 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 This has progressed when compared to prior MRI. 2. Well-defined oval lesion the prevertebral soft tissues to the left of midline at the L5-S1 level, only seen on the sagittal images. This appear with similar size although slightly different T2 signal when compared to study performed 2019 but enlarged when compared to study performed in 2014. 3. 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 Recommend followup by ultrasound in 2 years. This recommendation follows ACR consensus guidelines: White Paper of the ACR Incidental Findings Committee II on Vascular Findings. J Am Coll Radiol 2013; 10:     Electronically Signed   By: [name] M.D.  On: 10/22/2019 14:06 No results found.  Recent Labs    05/27/24 1523 05/28/24 0522  WBC 20.4* 11.7*  HGB 10.1* 9.1*  HCT 32.5* 28.4*  PLT 429* 304    Recent Labs    05/27/24 1523 05/28/24 0522  NA 138 142  K 4.0 3.8  CL 105 108  CO2 19* 23  GLUCOSE 148* 111*  BUN 24* 23  CREATININE 1.75* 1.70*  CALCIUM  9.3 8.7*     Intake/Output Summary (Last 24 hours) at 05/30/2024 0827 Last  data filed at 05/29/2024 2018 Gross per 24 hour  Intake 240 ml  Output --  Net 240 ml        Physical Exam: Vital Signs Blood pressure (!) 145/88, pulse 83, temperature 99.2 F (37.3 C), temperature source Oral, resp. rate 16, height 5' 11 (1.803 m), weight 85 kg, SpO2 99%.   General: No acute distress Mood and affect are appropriate Heart: Regular rate and rhythm no rubs murmurs or extra sounds Lungs: Clear to auscultation, breathing unlabored, no rales or wheezes Abdomen: Positive bowel sounds, soft nontender to palpation, nondistended Extremities: No clubbing, cyanosis, or edema Skin: No evidence of breakdown, no evidence of rash, IV infiltration with RUE forearm but not hand swelling , no erythema or tenderness  Neurologic: Cranial nerves II through XII intact, motor strength is 5/5 in bilateral deltoid, bicep, tricep, grip,3/5 R RIght and 3-/5 left  hip flexor, 4- Right and 3- left knee extensors, 5/5 RIght and 4/5 left ankle dorsiflexor and plantar flexor   Musculoskeletal: Full range of motion in all 4 extremities. No joint swelling Oriented to Gertner and place but not time, delayed responses   Assessment/Plan: 1. Functional deficits which require 3+ hours per day of interdisciplinary therapy in a comprehensive inpatient rehab setting. Physiatrist is providing close team supervision and 24 hour management of active medical problems listed below. Physiatrist and rehab team continue to assess barriers to discharge/monitor patient progress toward functional and medical goals  Care Tool:  Bathing    Body parts bathed by patient: Right arm, Left arm, Chest, Front perineal area, Abdomen, Right upper leg, Left upper leg, Face   Body parts bathed by helper: Buttocks, Right lower leg, Left lower leg     Bathing assist Assist Level: Moderate Assistance - Patient 50 - 74%     Upper Body Dressing/Undressing Upper body dressing   What is the patient wearing?: Pull over  shirt    Upper body assist Assist Level: Set up assist    Lower Body Dressing/Undressing Lower body dressing      What is the patient wearing?: Pants     Lower body assist Assist for lower body dressing: Maximal Assistance - Patient 25 - 49%     Toileting Toileting    Toileting assist Assist for toileting: Moderate Assistance - Patient 50 - 74%     Transfers Chair/bed transfer  Transfers assist     Chair/bed transfer assist level: Moderate Assistance - Patient 50 - 74%     Locomotion Ambulation   Ambulation assist   Ambulation activity did not occur: Safety/medical concerns  Assist level: Minimal Assistance - Patient > 75% Assistive device: Walker-rolling Max distance: 10'   Walk 10 feet activity   Assist  Walk 10 feet activity did not occur: Safety/medical concerns  Assist level: Minimal Assistance - Patient > 75% Assistive device: Walker-rolling   Walk 50 feet activity   Assist Walk 50 feet with 2 turns activity did not occur: Safety/medical concerns  Walk 150 feet activity   Assist Walk 150 feet activity did not occur: Safety/medical concerns         Walk 10 feet on uneven surface  activity   Assist Walk 10 feet on uneven surfaces activity did not occur: Safety/medical concerns         Wheelchair     Assist Is the patient using a wheelchair?: Yes Type of Wheelchair: Manual    Wheelchair assist level: Supervision/Verbal cueing Max wheelchair distance: 100'    Wheelchair 50 feet with 2 turns activity    Assist        Assist Level: Supervision/Verbal cueing   Wheelchair 150 feet activity     Assist      Assist Level: Minimal Assistance - Patient > 75%   Blood pressure (!) 145/88, pulse 83, temperature 99.2 F (37.3 C), temperature source Oral, resp. rate 16, height 5' 11 (1.803 m), weight 85 kg, SpO2 99%.  Medical Problem List and Plan: 1. 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  Antithrombotics: -DVT/anticoagulation:  Pharmaceutical: Lovenox , age indeterminate asymptomatic Left Fem DVT, per Dr Sheree [facility], cont current meds, no activity restriction              -antiplatelet therapy: continue ASA   3. Pain Management: continue Oxycodone  prn for severe and tylenol  prn for mild pain   10/4-10/5- denies pain at all this AM- con't regimen 4. Mood/Behavior/Sleep: LCSW to follow for evaluation and support.              -antipsychotic agents: N/A   5. Neuropsych/cognition: This patient is not capable of making decisions on his own behalf. 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 Skin/Wound Care: Routine pressure relief measures. Monitor incision for healing Right antecubital IV infiltration    7. Fluids/Electrolytes/Nutrition:  Monitor I/O. Check CMET in am             --offer supplement prn   8. T2DM: Monitor BS ac/hs and use SSI for elevated BS             --Was on metform 500 mg bid--held due to dye study, consider resuming once kidney function improves   10/4- BG's running 100-118- con't to monitor 9.HTN: Monitor BP TID--off home meds at this time. BP reviewed and is starting to increase, will start amlodipine  2.5mg  daily Vitals:   05/29/24 2018 05/30/24 0527  BP: (!) 148/84 (!) 145/88  Pulse: 80 83  Resp: 16 16  Temp: 98.6 F (37 C) 99.2 F (37.3 C)  SpO2: 98% 99%  Increase amlodipine  to 10mg  daily - sys elevation mild    10. AKI: BUN/SCr 11/1.21 at admission with rise to 19/1.54 post procedure                 Latest Ref Rng & Units 05/28/2024    5:22 AM 05/27/2024    3:23 PM 05/25/2024    6:59 AM  BMP  Glucose 70 - 99 mg/dL 888  851  898   BUN 8 - 23 mg/dL 23  24  17    Creatinine 0.61 - 1.24 mg/dL 8.29  8.24  8.56   Sodium 135 - 145 mmol/L 142  138   141   Potassium 3.5 - 5.1 mmol/L 3.8  4.0  4.2   Chloride 98 - 111 mmol/L 108  105  109   CO2 22 - 32 mmol/L 23  19  24    Calcium  8.9 - 10.3 mg/dL 8.7  9.3  8.8    Recheck 10/13 Labs stable , no orthostatic hypotension  d/c IVF after current bag 11. Thrombocytopenia: resolved now in high normal range   12.  ABLA: Recheck stable no hematuria, melena noted, cont to monitor at least weekly - stable     Latest Ref Rng & Units 05/28/2024    5:22 AM 05/27/2024    3:23 PM 05/25/2024    6:59 AM  CBC  WBC 4.0 - 10.5 K/uL 11.7  20.4  7.0   Hemoglobin 13.0 - 17.0 g/dL 9.1  89.8  9.1   Hematocrit 39.0 - 52.0 % 28.4  32.5  28.8   Platelets 150 - 400 K/uL 304  429  401   Dropped a point after IVF, recheck Monday , no overt signs of bleeding, asymptomatic   13. Leucocytosis: resolved    14.  H/o dementia vascular: continue Namenda .  Has been seeing neuro as outpt   15.  Bilateral LE weakness proximally and Left distal LE weakness with hx Lumbar stenosis , suspect progression since prior Lumbar MRI in 2021, pt has had L3-4 fusion per Dr Beuford in 2021- some improvement in LE strength , pt feels decline in strength was post op after aneurysm repair, however pt has hx of dementia and is poor historian, had Hospital admit July 2025 and was reportedly using walker at baseline and went to SNF.  Using walker at OV with [facility] 04/08/2024 Pt had repeat MRI lumbar showing progressive L2-3 central stenosis , severe bilateral L4-5 foraminal stenosis, intrathecal T1 signal at L5-S1 Discussed case with Dr Beuford.  Will need f/u with Dr Beuford post discharge    LOS: 13 days A FACE TO FACE EVALUATION WAS PERFORMED  Prentice FORBES Compton 05/30/2024, 8:27 AM

## 2024-05-30 NOTE — Progress Notes (Signed)
 Patient complaining of discomfort with episodes of voiding. Reports an increase pressure and a second sensation to void. Wanting to double-void. After one episode bladder scanned and 0 mL of fluids. Right side of inguinal area does appear swollen. Denies any pain or burning sensation with voiding. Does endorse increase episodes of voiding today and increase incontinence episodes.

## 2024-05-30 NOTE — Progress Notes (Signed)
 Physical Therapy Session Note  Patient Details  Name: [name] [name] MRN: [id number] Date of Birth: 06/08/1946  Today's Date: 05/30/2024 PT Individual Time: 1103-1158 PT Individual Time Calculation (min): 55 min   Short Term Goals: Week 1:  PT Short Term Goal 1 (Week 1): Pt will be able to perform bed mobility with mod A PT Short Term Goal 1 - Progress (Week 1): Met PT Short Term Goal 2 (Week 1): Pt will be able to perform transfers bed <> w/c with mod A PT Short Term Goal 2 - Progress (Week 1): Met PT Short Term Goal 3 (Week 1): Pt will initiate gait PT Short Term Goal 3 - Progress (Week 1): Met PT Short Term Goal 4 (Week 1): Pt will demonstrate dynamic sitting balance with min A PT Short Term Goal 4 - Progress (Week 1): Met  Skilled Therapeutic Interventions/Progress Updates:  Pt was seen bedside in the am. Pt rolled R/L with side rail and mod A to assist with putting on pants. Pt transferred supine to edge of bed with mod A and verbal cues. Pt tolerated edge of bed with S to min A and verbal cues. Pt tends to lean posteriorly to the R, requiring verbal and tactile cues to correct. Pt performed multiple sit to stand transfers with min A and verbal cues with rolling walker. Pt transferred edge of bed to w/c with rolling walker and min to mod A. Pt performed toilet transfers with stedy and assist x 1. Dependent for hygiene. Pt transferred back to w/c with stedy and assist x 1. Pt transported to rehab gym. Pt had to use bathroom again. Pt returned to room. Pt ambulated about 10 feet with rolling walker and min to mod A with multiple cues for sequencing and posture. Pt performed toilet transfers with rolling walker and mod A with verbal cues. Pt ambulated back to w/c about 10 feet with rolling walker and min to mod A with verbal cues for posture and technique. Pt left sitting up in w/c with all needs within reach and chair alarm on.   Therapy Documentation Precautions:   Precautions Precautions: Fall Recall of Precautions/Restrictions: Impaired Precaution/Restrictions Comments: incision near L clavicle (axillary artery) and bil groins; L weakness Restrictions Weight Bearing Restrictions Per Provider Order: No General:   Pain: Pain Assessment Pain Scale: 0-10 Pain Score: 0-No pain  Therapy/Group: Individual Therapy  Shardee Dieu G 05/30/2024, 12:51 PM

## 2024-05-30 NOTE — Progress Notes (Signed)
 Speech Language Pathology Daily Session Note  Patient Details  Name: [name] [name] MRN: [id number] Date of Birth: 15-May-1946  Today's Date: 05/30/2024 SLP Individual Time: 8499-8465 SLP Individual Time Calculation (min): 34 min and Today's Date: 05/30/2024 SLP Missed Time: 11 Minutes Missed Time Reason: Other (Comment) (visitors present - pt requested SLP return later)  Short Term Goals: Week 2: SLP Short Term Goal 1 (Week 2): Patient will demonstrate problem solving in mildly complex situations given min multimodal A SLP Short Term Goal 2 (Week 2): Patient will utilize external aids to recall functional daily information with 80% accuracy given min assist. SLP Short Term Goal 3 (Week 2): Pt will process mildly complex information w/ min A SLP Short Term Goal 4 (Week 2): Pt will complete mildly specific word finding tasks w/ minA SLP Short Term Goal 5 (Week 2): Pt will complete verbal organization/thought formulation tasks w/ minA  Skilled Therapeutic Interventions:   SLP returned after pt's visitor left for tx targeting cognition. He immediately requested assistance w/ the urinal. SLP assisted him to stand, however, he was unable to void. He benefited from supervisionA for sequencing and problem solving throughout toileting attempt. He then completed a mildly complex written processing task (email list) w/ modA. ModA also required for organization. As SLP was leaving, he reported (presumed) incontinent episode of urine. Nursing notified and he was left w/ the call light within reach. Recommend cont ST per POC.    Pain  None reported  Therapy/Group: Individual Therapy  [name] 05/30/2024, 4:24 PM

## 2024-05-30 NOTE — Plan of Care (Signed)
  Problem: Consults Goal: RH GENERAL PATIENT EDUCATION Description: See Patient Education module for education specifics. Outcome: Progressing   Problem: RH BOWEL ELIMINATION Goal: RH STG MANAGE BOWEL WITH ASSISTANCE Description: STG Manage Bowel with mod I Assistance. Outcome: Progressing Goal: RH STG MANAGE BOWEL W/MEDICATION W/ASSISTANCE Description: STG Manage Bowel with Medication with mod I Assistance. Outcome: Progressing   Problem: RH BLADDER ELIMINATION Goal: RH STG MANAGE BLADDER WITH ASSISTANCE Description: STG Manage Bladder With mod I Assistance Outcome: Progressing Goal: RH STG MANAGE BLADDER WITH MEDICATION WITH ASSISTANCE Description: STG Manage Bladder With Medication With Assistance. Outcome: Progressing   Problem: RH SAFETY Goal: RH STG ADHERE TO SAFETY PRECAUTIONS W/ASSISTANCE/DEVICE Description: STG Adhere to Safety Precautions With cues  Assistance/Device. Outcome: Progressing   Problem: RH PAIN MANAGEMENT Goal: RH STG PAIN MANAGED AT OR BELOW PT'S PAIN GOAL Description: Pain < 4 with prns Outcome: Progressing   Problem: RH Vision Goal: RH LTG Vision (Specify) Outcome: Progressing

## 2024-05-31 LAB — GLUCOSE, CAPILLARY
Glucose-Capillary: 132 mg/dL — ABNORMAL HIGH (ref 70–99)
Glucose-Capillary: 138 mg/dL — ABNORMAL HIGH (ref 70–99)
Glucose-Capillary: 179 mg/dL — ABNORMAL HIGH (ref 70–99)
Glucose-Capillary: 108 mg/dL — ABNORMAL HIGH (ref 70–99)

## 2024-05-31 NOTE — Progress Notes (Signed)
 PROGRESS NOTE   Subjective/Complaints:  Some voiding issues per LPN but pt denies Denies groin pain (noted per LPN on right side ROS-   Pt denies SOB, abd pain, CP, N/V/C/D   Objective:  MRI LUMBAR SPINE WITHOUT CONTRAST   TECHNIQUE: Multiplanar, multisequence MR imaging of the lumbar spine was performed. No intravenous contrast was administered.   COMPARISON:  MRI of the lumbar spine March 06, 2018 and January 23, 2013   FINDINGS: Segmentation: A transitional lumbosacral vertebra is assumed to represent the L5 level. Careful correlation with this numbering strategy prior to any procedural intervention would be recommended.   Alignment:  Physiologic.   Vertebrae: Interbody fusion and posterior left transpedicular fixation is noted at L3-4 with laminectomy at L4-5. Degenerative endplate changes are noted at L3-4. Marrow signal characteristics are otherwise maintained.   Conus medullaris and cauda equina: Conus extends to the T12-L1 level. Conus appear normal. Redundancy of nerve roots at the level of L1 is related to L2-3 spinal canal stenosis.   Paraspinal and other soft tissues: Well-defined oval lesion located to the left of midline in the prevertebral soft tissue at the level of L5-S1 is seen in the sagittal views. The lesion as predominantly hypointense signal on T1 and heterogeneous signal on T2. When compared to prior study, the lesion appears with similar size (3.7 x 2.7 cm) when compared to study performed 2019 but enlarged when compared to MRI performed 2014. T2 signal appear changed when compared to prior study.   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   Right renal cysts.   Disc levels:   T12-L1: No spinal canal or neural foraminal stenosis.   L1-2: No spinal canal or neural foraminal stenosis.   L2-3: Disc bulge with superimposed tiny right central  disc protrusion, facet degenerative changes and ligamentum flavum redundancy resulting in moderate to severe spinal canal stenosis, moderate right and severe left neural foraminal stenosis.   L3-4: Laminectomy with facet fusion. No spinal canal or neural foraminal stenosis.   L4-5: Disc bulge with tiny superimposed central disc protrusion and mild facet degenerative changes result in severe bilateral neural foraminal narrowing. Laminectomy is also noted with no spinal canal stenosis.   L5-S1: No spinal canal or neural foraminal stenosis.   Compared to prior study, there has been significant progression of the degree of spinal canal stenosis at L2-3.   IMPRESSION: 1. 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 This has progressed when compared to prior MRI. 2. Well-defined oval lesion the prevertebral soft tissues to the left of midline at the L5-S1 level, only seen on the sagittal images. This appear with similar size although slightly different T2 signal when compared to study performed 2019 but enlarged when compared to study performed in 2014. 3. 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 Recommend followup by ultrasound in 2 years. This recommendation follows ACR consensus guidelines: White Paper of the ACR Incidental Findings Committee II on Vascular Findings. J Am Coll Radiol 2013; 10:     Electronically Signed  By: [name] M.D.   On: 10/22/2019 14:06 No results found.  No results for input(s): WBC, HGB, HCT, PLT in the last 72 hours.   No results for input(s): NA, K, CL, CO2, GLUCOSE, BUN, CREATININE, CALCIUM  in the last 72 hours.    Intake/Output Summary (Last 24 hours) at 05/31/2024 0800 Last data filed at 05/30/2024 1826 Gross per 24  hour  Intake 120 ml  Output --  Net 120 ml        Physical Exam: Vital Signs Blood pressure (!) 150/81, pulse 80, temperature 98.5 F (36.9 C), resp. rate 18, height 5' 11 (1.803 m), weight 85 kg, SpO2 98%.   General: No acute distress Mood and affect are appropriate Heart: Regular rate and rhythm no rubs murmurs or extra sounds Lungs: Clear to auscultation, breathing unlabored, no rales or wheezes Abdomen: Positive bowel sounds, soft nontender to palpation, nondistended No inguinal mass or lesions or tenderness on RIght side  Extremities: No clubbing, cyanosis, or edema Skin: No evidence of breakdown, no evidence of rash, IV infiltration with RUE forearm but not hand swelling , no erythema or tenderness  Neurologic: Cranial nerves II through XII intact, motor strength is 5/5 in bilateral deltoid, bicep, tricep, grip,3/5 R RIght and 3-/5 left  hip flexor, 4- Right and 3- left knee extensors, 5/5 RIght and 4/5 left ankle dorsiflexor and plantar flexor   Musculoskeletal: Full range of motion in all 4 extremities. No joint swelling Oriented to [place] and place but not time, delayed responses   Assessment/Plan: 1. Functional deficits which require 3+ hours per day of interdisciplinary therapy in a comprehensive inpatient rehab setting. Physiatrist is providing close team supervision and 24 hour management of active medical problems listed below. Physiatrist and rehab team continue to assess barriers to discharge/monitor patient progress toward functional and medical goals  Care Tool:  Bathing    Body parts bathed by patient: Right arm, Left arm, Chest, Front perineal area, Abdomen, Right upper leg, Left upper leg, Face   Body parts bathed by helper: Buttocks, Right lower leg, Left lower leg     Bathing assist Assist Level: Moderate Assistance - Patient 50 - 74%     Upper Body Dressing/Undressing Upper body dressing   What is the patient wearing?: Pull over shirt    Upper  body assist Assist Level: Set up assist    Lower Body Dressing/Undressing Lower body dressing      What is the patient wearing?: Pants     Lower body assist Assist for lower body dressing: Maximal Assistance - Patient 25 - 49%     Toileting Toileting    Toileting assist Assist for toileting: Moderate Assistance - Patient 50 - 74%     Transfers Chair/bed transfer  Transfers assist     Chair/bed transfer assist level: Moderate Assistance - Patient 50 - 74%     Locomotion Ambulation   Ambulation assist   Ambulation activity did not occur: Safety/medical concerns  Assist level: Minimal Assistance - Patient > 75% Assistive device: Walker-rolling Max distance: 10   Walk 10 feet activity   Assist  Walk 10 feet activity did not occur: Safety/medical concerns  Assist level: Minimal Assistance - Patient > 75% Assistive device: Walker-rolling   Walk 50 feet activity   Assist Walk 50 feet with 2 turns activity did not occur: Safety/medical concerns         Walk 150 feet activity   Assist Walk 150 feet activity did not  occur: Safety/medical concerns         Walk 10 feet on uneven surface  activity   Assist Walk 10 feet on uneven surfaces activity did not occur: Safety/medical concerns         Wheelchair     Assist Is the patient using a wheelchair?: Yes Type of Wheelchair: Manual    Wheelchair assist level: Supervision/Verbal cueing Max wheelchair distance: 100'    Wheelchair 50 feet with 2 turns activity    Assist        Assist Level: Supervision/Verbal cueing   Wheelchair 150 feet activity     Assist      Assist Level: Minimal Assistance - Patient > 75%   Blood pressure (!) 150/81, pulse 80, temperature 98.5 F (36.9 C), resp. rate 18, height 5' 11 (1.803 m), weight 85 kg, SpO2 98%.  Medical Problem List and Plan: 1. 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  Antithrombotics: -DVT/anticoagulation:  Pharmaceutical: Lovenox , age indeterminate asymptomatic Left Fem DVT, per Dr Sheree [facility], cont current meds, no activity restriction              -antiplatelet therapy: continue ASA   3. Pain Management: continue Oxycodone  prn for severe and tylenol  prn for mild pain   10/4-10/5- denies pain at all this AM- con't regimen 4. Mood/Behavior/Sleep: LCSW to follow for evaluation and support.              -antipsychotic agents: N/A   5. Neuropsych/cognition: This patient is not capable of making decisions on his own behalf. 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 Skin/Wound Care: Routine pressure relief measures. Monitor incision for healing Right antecubital IV infiltration    7. Fluids/Electrolytes/Nutrition:  Monitor I/O. Check CMET in am             --offer supplement prn   8. T2DM: Monitor BS ac/hs and use SSI for elevated BS             --Was on metform 500 mg bid--held due to dye study, consider resuming once kidney function improves   10/4- BG's running 100-118- con't to monitor 9.HTN: Monitor BP TID--off home meds at this time. BP reviewed and is starting to increase, will start amlodipine  2.5mg  daily Vitals:   05/30/24 1952 05/31/24 0245  BP: (!) 160/82 (!) 150/81  Pulse:  80  Resp:  18  Temp:  98.5 F (36.9 C)  SpO2:  98%  Increase amlodipine  to 10mg  daily - sys elevation mild improving 10/12    10. AKI: BUN/SCr 11/1.21 at admission with rise to 19/1.54 post procedure                 Latest Ref Rng & Units 05/28/2024    5:22 AM 05/27/2024    3:23 PM 05/25/2024    6:59 AM  BMP  Glucose 70 - 99 mg/dL 888  851  898   BUN 8 - 23 mg/dL 23  24  17    Creatinine 0.61 - 1.24 mg/dL 8.29  8.24  8.56   Sodium 135 - 145 mmol/L 142  138  141   Potassium 3.5 - 5.1 mmol/L 3.8  4.0  4.2  Chloride 98 - 111 mmol/L 108  105  109   CO2 22 - 32 mmol/L 23  19  24    Calcium  8.9 - 10.3 mg/dL 8.7  9.3  8.8    Recheck 10/13 Labs stable , no orthostatic hypotension  d/c IVF after current bag 11. Thrombocytopenia: resolved now in high normal range   12.  ABLA: Recheck stable no hematuria, melena noted, cont to monitor at least weekly - stable     Latest Ref Rng & Units 05/28/2024    5:22 AM 05/27/2024    3:23 PM 05/25/2024    6:59 AM  CBC  WBC 4.0 - 10.5 K/uL 11.7  20.4  7.0   Hemoglobin 13.0 - 17.0 g/dL 9.1  89.8  9.1   Hematocrit 39.0 - 52.0 % 28.4  32.5  28.8   Platelets 150 - 400 K/uL 304  429  401   Dropped a point after IVF, recheck Monday , no overt signs of bleeding, asymptomatic   13. Leucocytosis: resolved    14.  H/o dementia vascular: continue Namenda .  Has been seeing neuro as outpt   15.  Bilateral LE weakness proximally and Left distal LE weakness with hx Lumbar stenosis , suspect progression since prior Lumbar MRI in 2021, pt has had L3-4 fusion per Dr Beuford in 2021- some improvement in LE strength , pt feels decline in strength was post op after aneurysm repair, however pt has hx of dementia and is poor historian, had Hospital admit July 2025 and was reportedly using walker at baseline and went to SNF.  Using walker at OV with [facility] 04/08/2024 Pt had repeat MRI lumbar showing progressive L2-3 central stenosis , severe bilateral L4-5 foraminal stenosis, intrathecal T1 signal at L5-S1 Discussed case with Dr Beuford.  Will need f/u with Dr Beuford post discharge    LOS: 14 days A FACE TO FACE EVALUATION WAS PERFORMED  Prentice FORBES Compton 05/31/2024, 8:00 AM

## 2024-05-31 NOTE — Progress Notes (Signed)
 Physical Therapy Session Note  Patient Details  Name: [name] [name] MRN: [id number] Date of Birth: October 19, 1945  Today's Date: 05/31/2024 PT Individual Time: 0902-0945 PT Individual Time Calculation (min): 43 min   Short Term Goals: Week 2:  PT Short Term Goal 1 (Week 2): Pt will be able to perform bed mobility with consistent MinA PT Short Term Goal 2 (Week 2): Pt will be able to perform transfers bed <> w/c with consistent Min A PT Short Term Goal 3 (Week 2): Pt will demonstrate dynamic sitting balance with CGA PT Short Term Goal 4 (Week 2): Pt will ambulate >50 ft consistently with CGA/ MinA.  Skilled Therapeutic Interventions/Progress Updates: Pt presents supine in bed and agreeable to therapy although feels need for BM.  Pt trasfers sup to sit w/ increased time and mod A.  Pt required mod A for sit to stand in [place] for transfer to toilet in BR.  Pt was incontinent in brief and transferred to toilet.  Pt states finished and sit to stand w/ mod A.  Total A for pericare and max A for brief pull up.  Pt then states need for BM.  Pt again was incontinent of bowel in brief and continent in toilet.  Pt transferred sit to stand and total A for pericare by NT.  Pt wheeled to recliner and transferred stand to sit w/ mod A and cues.  Pants threaded over feet after pt attempts and then total A for shoes.  Pt transfers sit to stand w/ mod A and mod A for pulling up pants.  Pt sat in recliner but states need for urinal.  Pt able to lift hips off seat, total A for PT to pull brief and pants down, urinal in place and pt holding, NT called to room for assist, handed off to NT.     Therapy Documentation Precautions:  Precautions Precautions: Fall Recall of Precautions/Restrictions: Impaired Precaution/Restrictions Comments: incision near L clavicle (axillary artery) and bil groins; L weakness Restrictions Weight Bearing Restrictions Per Provider Order: No General:   Vital Signs: Therapy Vitals Pulse  Rate: 86 BP: (!) 147/90 Patient Position (if appropriate): Lying Oxygen Therapy SpO2: 99 % O2 Device: Room Air Pain: no c/o Pain Assessment Pain Scale: 0-10 Pain Score: 0-No pain    Therapy/Group: Individual Therapy  Yoav Okane P Quinisha Mould 05/31/2024, 9:49 AM

## 2024-06-01 DIAGNOSIS — Z794 Long term (current) use of insulin: Secondary | ICD-10-CM

## 2024-06-01 DIAGNOSIS — D62 Acute posthemorrhagic anemia: Secondary | ICD-10-CM

## 2024-06-01 DIAGNOSIS — D72829 Elevated white blood cell count, unspecified: Secondary | ICD-10-CM

## 2024-06-01 DIAGNOSIS — E1169 Type 2 diabetes mellitus with other specified complication: Secondary | ICD-10-CM

## 2024-06-01 DIAGNOSIS — N179 Acute kidney failure, unspecified: Secondary | ICD-10-CM

## 2024-06-01 DIAGNOSIS — I10 Essential (primary) hypertension: Secondary | ICD-10-CM

## 2024-06-01 LAB — CBC
HCT: 28.8 % — ABNORMAL LOW (ref 39.0–52.0)
Hemoglobin: 9.2 g/dL — ABNORMAL LOW (ref 13.0–17.0)
MCH: 28.1 pg (ref 26.0–34.0)
MCHC: 31.9 g/dL (ref 30.0–36.0)
MCV: 88.1 fL (ref 80.0–100.0)
Platelets: 209 K/uL (ref 150–400)
RBC: 3.27 MIL/uL — ABNORMAL LOW (ref 4.22–5.81)
RDW: 14.6 % (ref 11.5–15.5)
WBC: 9.6 K/uL (ref 4.0–10.5)
nRBC: 0 % (ref 0.0–0.2)

## 2024-06-01 LAB — BASIC METABOLIC PANEL WITH GFR
Anion gap: 9 (ref 5–15)
BUN: 13 mg/dL (ref 8–23)
CO2: 25 mmol/L (ref 22–32)
Calcium: 9 mg/dL (ref 8.9–10.3)
Chloride: 106 mmol/L (ref 98–111)
Creatinine, Ser: 1.4 mg/dL — ABNORMAL HIGH (ref 0.61–1.24)
GFR, Estimated: 51 mL/min — ABNORMAL LOW (ref >60)
Glucose, Bld: 131 mg/dL — ABNORMAL HIGH (ref 70–99)
Potassium: 4.6 mmol/L (ref 3.5–5.1)
Sodium: 140 mmol/L (ref 135–145)

## 2024-06-01 LAB — GLUCOSE, CAPILLARY
Glucose-Capillary: 127 mg/dL — ABNORMAL HIGH (ref 70–99)
Glucose-Capillary: 121 mg/dL — ABNORMAL HIGH (ref 70–99)
Glucose-Capillary: 126 mg/dL — ABNORMAL HIGH (ref 70–99)
Glucose-Capillary: 188 mg/dL — ABNORMAL HIGH (ref 70–99)

## 2024-06-01 NOTE — Progress Notes (Signed)
 PROGRESS NOTE   Subjective/Complaints: No acute events overnight noted.  Denies any new concerns this morning.  ROS-   Pt denies fever, SOB, chest pain, abd pain,  N/V/C/D, new motor or sensory changes   Objective:  MRI LUMBAR SPINE WITHOUT CONTRAST   TECHNIQUE: Multiplanar, multisequence MR imaging of the lumbar spine was performed. No intravenous contrast was administered.   COMPARISON:  MRI of the lumbar spine March 06, 2018 and January 23, 2013   FINDINGS: Segmentation: A transitional lumbosacral vertebra is assumed to represent the L5 level. Careful correlation with this numbering strategy prior to any procedural intervention would be recommended.   Alignment:  Physiologic.   Vertebrae: Interbody fusion and posterior left transpedicular fixation is noted at L3-4 with laminectomy at L4-5. Degenerative endplate changes are noted at L3-4. Marrow signal characteristics are otherwise maintained.   Conus medullaris and cauda equina: Conus extends to the T12-L1 level. Conus appear normal. Redundancy of nerve roots at the level of L1 is related to L2-3 spinal canal stenosis.   Paraspinal and other soft tissues: Well-defined oval lesion located to the left of midline in the prevertebral soft tissue at the level of L5-S1 is seen in the sagittal views. The lesion as predominantly hypointense signal on T1 and heterogeneous signal on T2. When compared to prior study, the lesion appears with similar size (3.7 x 2.7 cm) when compared to study performed 2019 but enlarged when compared to MRI performed 2014. T2 signal appear changed when compared to prior study.   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   Right renal cysts.   Disc levels:   T12-L1: No spinal canal or neural foraminal stenosis.   L1-2: No spinal canal or neural foraminal stenosis.   L2-3: Disc bulge with superimposed tiny  right central disc protrusion, facet degenerative changes and ligamentum flavum redundancy resulting in moderate to severe spinal canal stenosis, moderate right and severe left neural foraminal stenosis.   L3-4: Laminectomy with facet fusion. No spinal canal or neural foraminal stenosis.   L4-5: Disc bulge with tiny superimposed central disc protrusion and mild facet degenerative changes result in severe bilateral neural foraminal narrowing. Laminectomy is also noted with no spinal canal stenosis.   L5-S1: No spinal canal or neural foraminal stenosis.   Compared to prior study, there has been significant progression of the degree of spinal canal stenosis at L2-3.   IMPRESSION: 1. 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 This has progressed when compared to prior MRI. 2. Well-defined oval lesion the prevertebral soft tissues to the left of midline at the L5-S1 level, only seen on the sagittal images. This appear with similar size although slightly different T2 signal when compared to study performed 2019 but enlarged when compared to study performed in 2014. 3. 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 Recommend followup by ultrasound in 2 years. This recommendation follows ACR consensus guidelines: White Paper of the ACR Incidental Findings Committee II on Vascular Findings. J Am Coll Radiol 2013; 10:  Electronically Signed   By: [name] M.D.   On: 10/22/2019 14:06 No results found.  Recent Labs    06/01/24 0527  WBC 9.6  HGB 9.2*  HCT 28.8*  PLT 209     Recent Labs    06/01/24 0527  NA 140  K 4.6  CL 106  CO2 25  GLUCOSE 131*  BUN 13  CREATININE 1.40*  CALCIUM  9.0      Intake/Output Summary (Last 24 hours) at 06/01/2024 1607 Last data filed at  06/01/2024 1305 Gross per 24 hour  Intake 616 ml  Output --  Net 616 ml        Physical Exam: Vital Signs Blood pressure 134/76, pulse 88, temperature 98.3 F (36.8 C), temperature source Oral, resp. rate 18, height 5' 11 (1.803 m), weight 85 kg, SpO2 100%.   General: No acute distress laying in bed appears comfortable Mood and affect are appropriate Heart: Regular rate and rhythm no rubs murmurs or extra sounds Lungs: Clear to auscultation, breathing unlabored, no rales or wheezes Abdomen: Positive bowel sounds, soft nontender to palpation, nondistended No inguinal mass or lesions or tenderness on RIght side  Extremities: No clubbing, cyanosis, or edema Skin: No evidence of breakdown, no evidence of rash, IV infiltration with RUE forearm but not hand swelling , no erythema or tenderness  Neurologic: Cranial nerves II through XII intact, motor strength is 5/5 in bilateral deltoid, bicep, tricep, grip,3/5 R RIght and 3-/5 left  hip flexor, 4- Right and 3- left knee extensors, 5/5 RIght and 4/5 left ankle dorsiflexor and plantar flexor   Musculoskeletal: Full range of motion in all 4 extremities. No joint swelling Oriented to Butrum and place but not time, delayed responses   Prior neuro assessment is c/w today's exam 06/01/2024.   Assessment/Plan: 1. Functional deficits which require 3+ hours per day of interdisciplinary therapy in a comprehensive inpatient rehab setting. Physiatrist is providing close team supervision and 24 hour management of active medical problems listed below. Physiatrist and rehab team continue to assess barriers to discharge/monitor patient progress toward functional and medical goals  Care Tool:  Bathing    Body parts bathed by patient: Right arm, Left arm, Chest, Front perineal area, Abdomen, Right upper leg, Left upper leg, Face, Right lower leg, Left lower leg   Body parts bathed by helper: Buttocks     Bathing assist Assist Level: Minimal  Assistance - Patient > 75%     Upper Body Dressing/Undressing Upper body dressing   What is the patient wearing?: Pull over shirt    Upper body assist Assist Level: Set up assist    Lower Body Dressing/Undressing Lower body dressing      What is the patient wearing?: Underwear/pull up, Pants     Lower body assist Assist for lower body dressing: Moderate Assistance - Patient 50 - 74%     Toileting Toileting    Toileting assist Assist for toileting: Maximal Assistance - Patient 25 - 49%     Transfers Chair/bed transfer  Transfers assist     Chair/bed transfer assist level: Minimal Assistance - Patient > 75%     Locomotion Ambulation   Ambulation assist   Ambulation activity did not occur: Safety/medical concerns  Assist level: Minimal Assistance - Patient > 75% Assistive device: Walker-rolling Max distance: 10   Walk 10 feet activity   Assist  Walk 10 feet activity did not occur: Safety/medical concerns  Assist level: Minimal Assistance - Patient > 75%  Assistive device: Walker-rolling   Walk 50 feet activity   Assist Walk 50 feet with 2 turns activity did not occur: Safety/medical concerns         Walk 150 feet activity   Assist Walk 150 feet activity did not occur: Safety/medical concerns         Walk 10 feet on uneven surface  activity   Assist Walk 10 feet on uneven surfaces activity did not occur: Safety/medical concerns         Wheelchair     Assist Is the patient using a wheelchair?: Yes Type of Wheelchair: Manual    Wheelchair assist level: Supervision/Verbal cueing Max wheelchair distance: 100'    Wheelchair 50 feet with 2 turns activity    Assist        Assist Level: Supervision/Verbal cueing   Wheelchair 150 feet activity     Assist      Assist Level: Minimal Assistance - Patient > 75%   Blood pressure 134/76, pulse 88, temperature 98.3 F (36.8 C), temperature source Oral, resp. rate 18,  height 5' 11 (1.803 m), weight 85 kg, SpO2 100%.  Medical Problem List and Plan: 1. 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  Antithrombotics: -DVT/anticoagulation:  Pharmaceutical: Lovenox , age indeterminate asymptomatic Left Fem DVT, per Dr Sheree [facility], cont current meds, no activity restriction              -antiplatelet therapy: continue ASA   3. Pain Management: continue Oxycodone  prn for severe and tylenol  prn for mild pain   10/4-10/5- denies pain at all this AM- con't regimen 4. Mood/Behavior/Sleep: LCSW to follow for evaluation and support.              -antipsychotic agents: N/A   5. Neuropsych/cognition: This patient is not capable of making decisions on his own behalf. 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 Skin/Wound Care: Routine pressure relief measures. Monitor incision for healing Right antecubital IV infiltration    7. Fluids/Electrolytes/Nutrition:  Monitor I/O. Check CMET in am             --offer supplement prn   8. T2DM: Monitor BS ac/hs and use SSI for elevated BS             --Was on metform 500 mg bid--held due to dye study, consider resuming once kidney function improves   10/4- BG's running 100-118- con't to monitor  10-13 CBG is 108-127, fairly well-controlled continue to monitor  CBG (last 3)  Recent Labs    05/31/24 2114 06/01/24 0531 06/01/24 1141  GLUCAP 108* 127* 121*     9.HTN: Monitor BP TID--off home meds at this time. BP reviewed and is starting to increase, will start amlodipine  2.5mg  daily Vitals:   06/01/24 0340 06/01/24 1305  BP: (!) 169/85 134/76  Pulse: 76 88  Resp: 18 18  Temp: 98.4 F (36.9 C) 98.3 F (36.8 C)  SpO2: 99% 100%  Increase amlodipine  to 10mg  daily - sys elevation mild  improving 10/12 10/13 BP stable this afternoon continue to monitor trend    10. AKI: BUN/SCr 11/1.21 at admission with rise to 19/1.54 post procedure  Latest Ref Rng & Units 06/01/2024    5:27 AM 05/28/2024    5:22 AM 05/27/2024    3:23 PM  BMP  Glucose 70 - 99 mg/dL 868  888  851   BUN 8 - 23 mg/dL 13  23  24    Creatinine 0.61 - 1.24 mg/dL 8.59  8.29  8.24   Sodium 135 - 145 mmol/L 140  142  138   Potassium 3.5 - 5.1 mmol/L 4.6  3.8  4.0   Chloride 98 - 111 mmol/L 106  108  105   CO2 22 - 32 mmol/L 25  23  19    Calcium  8.9 - 10.3 mg/dL 9.0  8.7  9.3    Recheck 10/13 BUN and creatinine improved to 13/1.4 Labs stable , no orthostatic hypotension  d/c IVF after current bag 11. Thrombocytopenia: resolved now in high normal range   12.  ABLA: Recheck stable no hematuria, melena noted, cont to monitor at least weekly - stable     Latest Ref Rng & Units 06/01/2024    5:27 AM 05/28/2024    5:22 AM 05/27/2024    3:23 PM  CBC  WBC 4.0 - 10.5 K/uL 9.6  11.7  20.4   Hemoglobin 13.0 - 17.0 g/dL 9.2  9.1  89.8   Hematocrit 39.0 - 52.0 % 28.8  28.4  32.5   Platelets 150 - 400 K/uL 209  304  429   Dropped a point after IVF, recheck Monday , no overt signs of bleeding, asymptomatic   - 10/13 hemoglobin overall stable at 9.2 continue to monitor 13. Leucocytosis: resolved   - 10/13 WBC 9.6 today within normal limits   14.  H/o dementia vascular: continue Namenda .  Has been seeing neuro as outpt   15.  Bilateral LE weakness proximally and Left distal LE weakness with hx Lumbar stenosis , suspect progression since prior Lumbar MRI in 2021, pt has had L3-4 fusion per Dr Beuford in 2021- some improvement in LE strength , pt feels decline in strength was post op after aneurysm repair, however pt has hx of dementia and is poor historian, had Hospital admit July 2025 and was reportedly using walker at baseline and went to SNF.  Using walker at OV with [facility] 04/08/2024 Pt had repeat MRI  lumbar showing progressive L2-3 central stenosis , severe bilateral L4-5 foraminal stenosis, intrathecal T1 signal at L5-S1 Discussed case with Dr Beuford.  Will need f/u with Dr Beuford post discharge    LOS: 15 days A FACE TO FACE EVALUATION WAS PERFORMED  Murray Collier 06/01/2024, 4:07 PM

## 2024-06-01 NOTE — Progress Notes (Signed)
 Physical Therapy Session Note  Patient Details  Name: [name] [name] MRN: [id number] Date of Birth: [birth year]  Today's Date: 06/01/2024 PT Individual Time: 1502-1600 PT Individual Time Calculation (min): 58 min   Short Term Goals: Week 1:  PT Short Term Goal 1 (Week 1): Pt will be able to perform bed mobility with mod A PT Short Term Goal 1 - Progress (Week 1): Met PT Short Term Goal 2 (Week 1): Pt will be able to perform transfers bed <> w/c with mod A PT Short Term Goal 2 - Progress (Week 1): Met PT Short Term Goal 3 (Week 1): Pt will initiate gait PT Short Term Goal 3 - Progress (Week 1): Met PT Short Term Goal 4 (Week 1): Pt will demonstrate dynamic sitting balance with min A PT Short Term Goal 4 - Progress (Week 1): Met Week 2:  PT Short Term Goal 1 (Week 2): Pt will be able to perform bed mobility with consistent MinA PT Short Term Goal 2 (Week 2): Pt will be able to perform transfers bed <> w/c with consistent Min A PT Short Term Goal 3 (Week 2): Pt will demonstrate dynamic sitting balance with CGA PT Short Term Goal 4 (Week 2): Pt will ambulate >50 ft consistently with CGA/ MinA. Week 3:     Skilled Therapeutic Interventions/Progress Updates:  Patient seated upright in w/c on entrance to room. Patient alert and agreeable to PT session.   Patient with no pain complaint at start of session.  When asked, pt relates having 3 STE home with no railing. Attempt to locate home on Google maps to see entry steps but home obscured by trees.   Therapeutic Activity: Transfers: Pt performed sit<>stand and stand pivot transfers throughout session with MinA/ CGA initially and then improves to ability to complete with supervision by end of session!! Provided vc only for forward scoot and technique.  Gait Training:  Pt ambulated in-room distances as well as 110' x1/ 50' x1 using RW with CGA and close w/c follow. Demonstrated continued flexed posture with L trendelenberg from hip weakness  and decreased L step length/ height. Provided tc to L hip musculature as well as NDT cueing at collarbone for improved posture. VC provided for posture and step length/ foot clearance.   Pt guided in ambulation of one 5 curbstep to simulate entry/ exit of home. Pt is able to complete with CGA overall using RW. Good balance noted to pick up RW and place either on step or on floor. MinA for step RW placement and positioning of all 4 feet on step.   Patient seated upright in w/c at end of session with brakes locked, belt alarm set, and all needs within reach. Pt has friend in room and 2 dtrs throughout session.    Therapy Documentation Precautions:  Precautions Precautions: Fall Recall of Precautions/Restrictions: Impaired Precaution/Restrictions Comments: incision near L clavicle (axillary artery) and bil groins; L weakness Restrictions Weight Bearing Restrictions Per Provider Order: No  Pain:  No pain complaint this session.    Therapy/Group: Individual Therapy  [name] PT, DPT, CSRS 06/01/2024, 5:15 PM

## 2024-06-01 NOTE — Progress Notes (Signed)
 Speech Language Pathology Daily Session Note  Patient Details  Name: [name] [name] MRN: [id number] Date of Birth: 1946-07-04  Today's Date: 06/01/2024 SLP Individual Time: 1004-1103 SLP Individual Time Calculation (min): 59 min  Short Term Goals: Week 2: SLP Short Term Goal 1 (Week 2): Patient will demonstrate problem solving in mildly complex situations given min multimodal A SLP Short Term Goal 2 (Week 2): Patient will utilize external aids to recall functional daily information with 80% accuracy given min assist. SLP Short Term Goal 3 (Week 2): Pt will process mildly complex information w/ min A SLP Short Term Goal 4 (Week 2): Pt will complete mildly specific word finding tasks w/ minA SLP Short Term Goal 5 (Week 2): Pt will complete verbal organization/thought formulation tasks w/ minA  Skilled Therapeutic Interventions:  Patient was seen in am to address cognitive re- training. Pt was alert and seated upright in [place] upon SLP arrival. PT was oreinted to location and situation, month and year though requiring verbal and visual cues to identify day of week on external aid. He recalled participation in earlier OT session where he recalled tasks completed with mod I. SLP engaging pt in a calendar interpretation task which he completed mod I. SLP subsequently challenging pt in a task abandoned by pt during last session together on 10/8. He completed a mildly complex scheduling task where he transcribed dates into an open calendar. Pt requiring extensive amount of time, frequent rest breaks and mod A for use of strategies. SLP also challenging pt intermittently throughout session in recall of functional information (therapy schedule and recommendations to increase fluids). Pt recalled information after a distracted delay with min A. At conclusion of session, pt was left upright in [facility] with call button within reach. SLP to continue POC.    Pain Pain Assessment Pain Scale: 0-10 Pain Score: 0-No  pain  Therapy/Group: Individual Therapy  [name] 06/01/2024, 10:42 AM

## 2024-06-01 NOTE — Progress Notes (Signed)
 Occupational Therapy Weekly Progress Note  Patient Details  Name: [name] [name] MRN: [id number] Date of Birth: 08-21-1945  Beginning of progress report period: May 25, 2024 End of progress report period: June 01, 2024  Today's Date: 06/01/2024 OT Individual Time: 9153-9040 OT Individual Time Calculation (min): 73 min    Patient has met 3 of 4 short term goals. Pt is steadily progressing towards reaching LTGs. Pt is progressing towards meeting toileting goal, however is at MAX A d/t needing assistance with pulling pants up and posterior peri care. Pt met toilet transfer goal at a MIN A level utilizing RW, standing goal for 2 min during functional activities and ADLs, and LB dressing goal with MOD A while seated utilizing reacher to weave feet into pants. Pt is planning to d/c home with wife who can provide 24/7 supervision. Pt and family would benefit from family education session before d/c, however wife has been present during therapy sessions, and has been participatory in discussing home setup and d/c planning.   Patient continues to demonstrate the following deficits: muscle weakness, decreased cardiorespiratoy endurance, impaired timing and sequencing, decreased coordination, and decreased motor planning, decreased visual motor skills, decreased midline orientation and decreased motor planning, decreased initiation, decreased attention, decreased awareness, decreased problem solving, decreased safety awareness, decreased memory, and delayed processing, central origin, and decreased sitting balance, decreased standing balance, decreased postural control, and decreased balance strategies and therefore will continue to benefit from skilled OT intervention to enhance overall performance with BADL and Reduce care partner burden.  Patient progressing toward long term goals..  Plan of care revisions: Downgraded goals d/t Pt CLOF, ELOS, and impact of cognitive deficits on current function. See  POC note for additional details.   OT Short Term Goals Week 2:  OT Short Term Goal 1 (Week 2): Pt will complete toileting with MOD A using LRAD OT Short Term Goal 1 - Progress (Week 2): Progressing toward goal OT Short Term Goal 2 (Week 2): Pt will complete toilet transfers with MIN A using LRAD OT Short Term Goal 2 - Progress (Week 2): Met OT Short Term Goal 3 (Week 2): Pt will tolerate standing using LRAD for 2 min during functional activity or ADLs OT Short Term Goal 3 - Progress (Week 2): Met OT Short Term Goal 4 (Week 2): Pt will complete LB dressing with MOD A using LRAD OT Short Term Goal 4 - Progress (Week 2): Met Week 3:  OT Short Term Goal 1 (Week 3): STGs=LTGs d/t ELOS  Skilled Therapeutic Interventions/Progress Updates:    Pt received resting in bed presenting to be in good spirits receptive to skilled OT session reporting 0/10 pain- OT offering intermittent rest breaks, repositioning, and therapeutic support to optimize participation in therapy session. Supine>EOB with SUP for safety and increased time. Stand pivot EOB>WC utilizing RW with MOD A for balance and upright posture and MOD cues for step placement and RW management. Stand pivot WC>BSC over toilet utilizing RW and MIN A for balance and MOD verbal cues for step placement and RW management. Pt with BM and urine void required MAX A for posterior peri care while standing utilizing RW with CGA for safety. Stand pivot BSC>WC with CGA for safety and MOD verbal cues for RW management. Transported via WC to sink d/t time management and fatigue. Pt completed U/LB bathing while seated in wheelchair at the sink with MIN A for back. Donned pullover shirt with SUP, depends and pants donned with MOD A utilizing reacher  to weave feet through and pulled pants over hips while standing with MAX A d/t time management-increased problem solving challenges noted using reacher for LB dressing. Encourage Media planner for LB dressing.  Completed oral care while seated in WC at the sink with SUP. Engaged Pt in memory book to recall tasks completed throughout the session. Pt was left resting in WC with call bell in reach, seatbelt alarm on, and all needs met.   Therapy Documentation Precautions:  Precautions Precautions: Fall Recall of Precautions/Restrictions: Impaired Precaution/Restrictions Comments: incision near L clavicle (axillary artery) and bil groins; L weakness Restrictions Weight Bearing Restrictions Per Provider Order: No   Therapy/Group: Individual Therapy  [name] 06/01/2024, 11:37 AM

## 2024-06-01 NOTE — Plan of Care (Signed)
  Problem: RH Simple Meal Prep Goal: LTG Patient will perform simple meal prep w/assist (OT) Description: LTG: Patient will perform simple meal prep with assistance, with/without cues (OT). Outcome: Not Applicable Note: Goal d/c d/t impact of cognitive deficits on current functional status. Recommending wife assist with IADLs at this time.    Problem: RH Dressing Goal: LTG Patient will perform lower body dressing w/assist (OT) Description: LTG: Patient will perform lower body dressing with assist, with/without cues in positioning using equipment (OT) Flowsheets (Taken 06/01/2024 1237) LTG: Pt will perform lower body dressing with assistance level of: Minimal Assistance - Patient > 75% Note: Downgraded goals d/t Pt CLOF, ELOS, and impact of cognitive deficits on current function.    Problem: RH Toileting Goal: LTG Patient will perform toileting task (3/3 steps) with assistance level (OT) Description: LTG: Patient will perform toileting task (3/3 steps) with assistance level (OT)  Flowsheets (Taken 06/01/2024 1237) LTG: Pt will perform toileting task (3/3 steps) with assistance level: Minimal Assistance - Patient > 75% Note: Downgraded goals d/t Pt CLOF, ELOS, and impact of cognitive deficits on current function.    Problem: RH Tub/Shower Transfers Goal: LTG Patient will perform tub/shower transfers w/assist (OT) Description: LTG: Patient will perform tub/shower transfers with assist, with/without cues using equipment (OT) Flowsheets (Taken 06/01/2024 1237) LTG: Pt will perform tub/shower stall transfers with assistance level of: Minimal Assistance - Patient > 75% Note: Downgraded goals d/t Pt CLOF, ELOS, and impact of cognitive deficits on current function.

## 2024-06-02 LAB — GLUCOSE, CAPILLARY
Glucose-Capillary: 129 mg/dL — ABNORMAL HIGH (ref 70–99)
Glucose-Capillary: 117 mg/dL — ABNORMAL HIGH (ref 70–99)
Glucose-Capillary: 140 mg/dL — ABNORMAL HIGH (ref 70–99)
Glucose-Capillary: 147 mg/dL — ABNORMAL HIGH (ref 70–99)

## 2024-06-02 NOTE — Progress Notes (Signed)
 Speech Language Pathology Weekly Progress and Session Note  Patient Details  Name: [name] [name] MRN: [id number] Date of Birth: April 20, 1946  Beginning of progress report period: May 26, 2024 End of progress report period: June 02, 2024  Today's Date: 06/02/2024 SLP Individual Time: 0801- 0856; 55 min   Short Term Goals: Week 2: SLP Short Term Goal 1 (Week 2): Patient will demonstrate problem solving in mildly complex situations given min multimodal A SLP Short Term Goal 1 - Progress (Week 2): Not met SLP Short Term Goal 2 (Week 2): Patient will utilize external aids to recall functional daily information with 80% accuracy given min assist. SLP Short Term Goal 2 - Progress (Week 2): Met SLP Short Term Goal 3 (Week 2): Pt will process mildly complex information w/ min A SLP Short Term Goal 3 - Progress (Week 2): Not met SLP Short Term Goal 4 (Week 2): Pt will complete mildly specific word finding tasks w/ minA SLP Short Term Goal 4 - Progress (Week 2): Not met SLP Short Term Goal 5 (Week 2): Pt will complete verbal organization/thought formulation tasks w/ minA SLP Short Term Goal 5 - Progress (Week 2): Not met   New Short Term Goals: Week 3: SLP Short Term Goal 1 (Week 3): STGs=LTGs due to ELOS  Weekly Progress Updates: Pt has made some gains and has met 1 of 5 STG's this reporting period due to improved recall of daily information. Pt limited this reporting period by fluctuating levels of fatigue and appeared cogntiive overload during task. He demonstrates intellectual awarenss of deficits and budding emergent awareness. Currently pt continues to require mod A for recall, attention, thought organization, word finding, processing, and problem solving. Pt/ family education ongoing. Pt would benefit from continued skilled SLP intervention to maximize cognition in order to maximize his functional independence prior to discharge.  Intensity: Minumum of 1-2 x/day, 30 to 90  minutes Frequency: 3 to 5 out of 7 days Duration/Length of Stay: 10/21 Treatment/Interventions: Cognitive remediation/compensation;Functional tasks;Cueing hierarchy;Patient/family education;Internal/external aids;Therapeutic Activities;Therapeutic Exercise;Speech/Language facilitation  Daily Session  Skilled Therapeutic Interventions: Patient was seen in am to address cognitive re- training. He was alert and seated upright in bed consuming breakfast meal. Pt indep oriented to time place, and situation. Reported good night of rest and was agreeable for session. SLP reviewed therapy schedule and challenged pt in recall of information throughout the session. Pt warranting min cues and use of external aid throughout session to recall information and mod A to make inferences and process information. In other minutes of session, SLP continuing to challenge pt in functional inferencing and interpretation skills of visual information given a functional task (I.e bill, TV guide, and weather forecast). Pt warranting between min and mod A to make inferences and for processing of information. At conclusion of session, pt was left upright in bed with call button within reach. SLP to continue POC.    General    Pain Pain Assessment Pain Scale: 0-10 Pain Score: 0-No pain  Therapy/Group: Individual Therapy  [name] [name] 06/02/2024, 4:05 PM

## 2024-06-02 NOTE — Progress Notes (Signed)
 PROGRESS NOTE   Subjective/Complaints: Feels ok, no bowel or bladder issues per pt , remembers my name   ROS-   Pt denies fever, SOB, chest pain, abd pain,  N/V/C/D, new motor or sensory changes   Objective:  MRI LUMBAR SPINE WITHOUT CONTRAST   TECHNIQUE: Multiplanar, multisequence MR imaging of the lumbar spine was performed. No intravenous contrast was administered.   COMPARISON:  MRI of the lumbar spine March 06, 2018 and January 23, 2013   FINDINGS: Segmentation: A transitional lumbosacral vertebra is assumed to represent the L5 level. Careful correlation with this numbering strategy prior to any procedural intervention would be recommended.   Alignment:  Physiologic.   Vertebrae: Interbody fusion and posterior left transpedicular fixation is noted at L3-4 with laminectomy at L4-5. Degenerative endplate changes are noted at L3-4. Marrow signal characteristics are otherwise maintained.   Conus medullaris and cauda equina: Conus extends to the T12-L1 level. Conus appear normal. Redundancy of nerve roots at the level of L1 is related to L2-3 spinal canal stenosis.   Paraspinal and other soft tissues: Well-defined oval lesion located to the left of midline in the prevertebral soft tissue at the level of L5-S1 is seen in the sagittal views. The lesion as predominantly hypointense signal on T1 and heterogeneous signal on T2. When compared to prior study, the lesion appears with similar size (3.7 x 2.7 cm) when compared to study performed 2019 but enlarged when compared to MRI performed 2014. T2 signal appear changed when compared to prior study.   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   Right renal cysts.   Disc levels:   T12-L1: No spinal canal or neural foraminal stenosis.   L1-2: No spinal canal or neural foraminal stenosis.   L2-3: Disc bulge with superimposed tiny right  central disc protrusion, facet degenerative changes and ligamentum flavum redundancy resulting in moderate to severe spinal canal stenosis, moderate right and severe left neural foraminal stenosis.   L3-4: Laminectomy with facet fusion. No spinal canal or neural foraminal stenosis.   L4-5: Disc bulge with tiny superimposed central disc protrusion and mild facet degenerative changes result in severe bilateral neural foraminal narrowing. Laminectomy is also noted with no spinal canal stenosis.   L5-S1: No spinal canal or neural foraminal stenosis.   Compared to prior study, there has been significant progression of the degree of spinal canal stenosis at L2-3.   IMPRESSION: 1. 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 This has progressed when compared to prior MRI. 2. Well-defined oval lesion the prevertebral soft tissues to the left of midline at the L5-S1 level, only seen on the sagittal images. This appear with similar size although slightly different T2 signal when compared to study performed 2019 but enlarged when compared to study performed in 2014. 3. 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 Recommend followup by ultrasound in 2 years. This recommendation follows ACR consensus guidelines: White Paper of the ACR Incidental Findings Committee II on Vascular Findings. J Am Coll Radiol 2013; 10:  Electronically Signed   By: [name] M.D.   On: 10/22/2019 14:06 No results found.  Recent Labs    06/01/24 0527  WBC 9.6  HGB 9.2*  HCT 28.8*  PLT 209     Recent Labs    06/01/24 0527  NA 140  K 4.6  CL 106  CO2 25  GLUCOSE 131*  BUN 13  CREATININE 1.40*  CALCIUM  9.0      Intake/Output Summary (Last 24 hours) at 06/02/2024 0745 Last data filed at  06/01/2024 1738 Gross per 24 hour  Intake 736 ml  Output --  Net 736 ml        Physical Exam: Vital Signs Blood pressure (!) 146/84, pulse 69, temperature 98.7 F (37.1 C), temperature source Oral, resp. rate 18, height 5' 11 (1.803 m), weight 85 kg, SpO2 98%.   General: No acute distress laying in bed appears comfortable Mood and affect are appropriate Heart: Regular rate and rhythm no rubs murmurs or extra sounds Lungs: Clear to auscultation, breathing unlabored, no rales or wheezes Abdomen: Positive bowel sounds, soft nontender to palpation, nondistended No inguinal mass or lesions or tenderness on RIght side  Extremities: No clubbing, cyanosis, or edema Skin: No evidence of breakdown, no evidence of rash, IV infiltration with RUE forearm but not hand swelling , no erythema or tenderness  Neurologic: Cranial nerves II through XII intact, motor strength is 5/5 in bilateral deltoid, bicep, tricep, grip,3/5 R RIght and 3-/5 left  hip flexor, 4- Right and 3- left knee extensors, 5/5 RIght and 4/5 left ankle dorsiflexor and plantar flexor   Musculoskeletal: Full range of motion in all 4 extremities. No joint swelling Oriented to [place] and place but not time, delayed responses   Prior neuro assessment is c/w today's exam 06/02/2024.   Assessment/Plan: 1. Functional deficits which require 3+ hours per day of interdisciplinary therapy in a comprehensive inpatient rehab setting. Physiatrist is providing close team supervision and 24 hour management of active medical problems listed below. Physiatrist and rehab team continue to assess barriers to discharge/monitor patient progress toward functional and medical goals  Care Tool:  Bathing    Body parts bathed by patient: Right arm, Left arm, Chest, Front perineal area, Abdomen, Right upper leg, Left upper leg, Face, Right lower leg, Left lower leg   Body parts bathed by helper: Buttocks     Bathing assist Assist Level: Minimal  Assistance - Patient > 75%     Upper Body Dressing/Undressing Upper body dressing   What is the patient wearing?: Pull over shirt    Upper body assist Assist Level: Set up assist    Lower Body Dressing/Undressing Lower body dressing      What is the patient wearing?: Underwear/pull up, Pants     Lower body assist Assist for lower body dressing: Moderate Assistance - Patient 50 - 74%     Toileting Toileting    Toileting assist Assist for toileting: Maximal Assistance - Patient 25 - 49%     Transfers Chair/bed transfer  Transfers assist     Chair/bed transfer assist level: Minimal Assistance - Patient > 75%     Locomotion Ambulation   Ambulation assist   Ambulation activity did not occur: Safety/medical concerns  Assist level: Minimal Assistance - Patient > 75% Assistive device: Walker-rolling Max distance: 10   Walk 10 feet activity   Assist  Walk 10 feet activity did not occur: Safety/medical concerns  Assist level: Minimal Assistance - Patient >  75% Assistive device: Walker-rolling   Walk 50 feet activity   Assist Walk 50 feet with 2 turns activity did not occur: Safety/medical concerns         Walk 150 feet activity   Assist Walk 150 feet activity did not occur: Safety/medical concerns         Walk 10 feet on uneven surface  activity   Assist Walk 10 feet on uneven surfaces activity did not occur: Safety/medical concerns         Wheelchair     Assist Is the patient using a wheelchair?: Yes Type of Wheelchair: Manual    Wheelchair assist level: Supervision/Verbal cueing Max wheelchair distance: 100'    Wheelchair 50 feet with 2 turns activity    Assist        Assist Level: Supervision/Verbal cueing   Wheelchair 150 feet activity     Assist      Assist Level: Minimal Assistance - Patient > 75%   Blood pressure (!) 146/84, pulse 69, temperature 98.7 F (37.1 C), temperature source Oral, resp. rate  18, height 5' 11 (1.803 m), weight 85 kg, SpO2 98%.  Medical Problem List and Plan: 1. 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  Antithrombotics: -DVT/anticoagulation:  Pharmaceutical: Lovenox , age indeterminate asymptomatic Left Fem DVT, per Dr Sheree [facility], cont current meds, no activity restriction              -antiplatelet therapy: continue ASA   3. Pain Management: continue Oxycodone  prn for severe and tylenol  prn for mild pain   10/4-10/5- denies pain at all this AM- con't regimen 4. Mood/Behavior/Sleep: LCSW to follow for evaluation and support.              -antipsychotic agents: N/A   5. Neuropsych/cognition: This patient is not capable of making decisions on his own behalf. 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 Skin/Wound Care: Routine pressure relief measures. Monitor incision for healing Right antecubital IV infiltration    7. Fluids/Electrolytes/Nutrition:  Monitor I/O. Check CMET in am             --offer supplement prn   8. T2DM: Monitor BS ac/hs and use SSI for elevated BS             --Was on metform 500 mg bid--held due to dye study, consider resuming once kidney function improves   10/4- BG's running 100-118- con't to monitor  10-13 CBG is 108-127, fairly well-controlled continue to monitor  CBG (last 3)  Recent Labs    06/01/24 1631 06/01/24 2157 06/02/24 0621  GLUCAP 126* 188* 129*     9.HTN: Monitor BP TID--off home meds at this time. BP reviewed and is starting to increase, will start amlodipine  2.5mg  daily Vitals:   06/01/24 2004 06/02/24 0515  BP: 131/76 (!) 146/84  Pulse: 70 69  Resp: 17 18  Temp: 98.4 F (36.9 C) 98.7 F (37.1 C)  SpO2: 98% 98%  Increase amlodipine  to 10mg  daily - sys elevation mild  improving 10/12 Mld systolic elevation    10. AKI: BUN/SCr 11/1.21 at admission with rise to 19/1.54 post procedure  Latest Ref Rng & Units 06/01/2024    5:27 AM 05/28/2024    5:22 AM 05/27/2024    3:23 PM  BMP  Glucose 70 - 99 mg/dL 868  888  851   BUN 8 - 23 mg/dL 13  23  24    Creatinine 0.61 - 1.24 mg/dL 8.59  8.29  8.24   Sodium 135 - 145 mmol/L 140  142  138   Potassium 3.5 - 5.1 mmol/L 4.6  3.8  4.0   Chloride 98 - 111 mmol/L 106  108  105   CO2 22 - 32 mmol/L 25  23  19    Calcium  8.9 - 10.3 mg/dL 9.0  8.7  9.3    Recheck 10/13 BUN and creatinine improved to 13/1.4 Labs stable , no orthostatic hypotension  d/c IVF after current bag 11. Thrombocytopenia: resolved now in high normal range   12.  ABLA: Recheck stable no hematuria, melena noted, cont to monitor at least weekly - stable     Latest Ref Rng & Units 06/01/2024    5:27 AM 05/28/2024    5:22 AM 05/27/2024    3:23 PM  CBC  WBC 4.0 - 10.5 K/uL 9.6  11.7  20.4   Hemoglobin 13.0 - 17.0 g/dL 9.2  9.1  89.8   Hematocrit 39.0 - 52.0 % 28.8  28.4  32.5   Platelets 150 - 400 K/uL 209  304  429   Dropped a point after IVF, recheck stable, likely reflects improved hydration status    - 13. Leucocytosis: resolved   - 10/13 WBC 9.6 today within normal limits   14.  H/o dementia vascular: continue Namenda .  Has been seeing neuro as outpt   15.  Bilateral LE weakness proximally and Left distal LE weakness with hx Lumbar stenosis , suspect progression since prior Lumbar MRI in 2021, pt has had L3-4 fusion per Dr Beuford in 2021- some improvement in LE strength , pt feels decline in strength was post op after aneurysm repair, however pt has hx of dementia and is poor historian, had Hospital admit July 2025 and was reportedly using walker at baseline and went to SNF.  Using walker at OV with [facility] 04/08/2024 Pt had repeat MRI lumbar showing progressive L2-3 central stenosis , severe bilateral L4-5 foraminal stenosis,  intrathecal T1 signal at L5-S1 Discussed case with Dr Beuford.  Will need f/u with Dr Beuford post discharge    LOS: 16 days A FACE TO FACE EVALUATION WAS PERFORMED  Prentice FORBES Compton 06/02/2024, 7:45 AM

## 2024-06-02 NOTE — Plan of Care (Signed)
  Problem: Consults Goal: RH GENERAL PATIENT EDUCATION Description: See Patient Education module for education specifics. Outcome: Progressing   Problem: RH BOWEL ELIMINATION Goal: RH STG MANAGE BOWEL WITH ASSISTANCE Description: STG Manage Bowel with mod I Assistance. Outcome: Progressing Goal: RH STG MANAGE BOWEL W/MEDICATION W/ASSISTANCE Description: STG Manage Bowel with Medication with mod I Assistance. Outcome: Progressing   Problem: RH BLADDER ELIMINATION Goal: RH STG MANAGE BLADDER WITH ASSISTANCE Description: STG Manage Bladder With mod I Assistance Outcome: Progressing Goal: RH STG MANAGE BLADDER WITH MEDICATION WITH ASSISTANCE Description: STG Manage Bladder With Medication With Assistance. Outcome: Progressing   Problem: RH SAFETY Goal: RH STG ADHERE TO SAFETY PRECAUTIONS W/ASSISTANCE/DEVICE Description: STG Adhere to Safety Precautions With cues  Assistance/Device. Outcome: Progressing   Problem: RH PAIN MANAGEMENT Goal: RH STG PAIN MANAGED AT OR BELOW PT'S PAIN GOAL Description: Pain < 4 with prns Outcome: Progressing   Problem: RH KNOWLEDGE DEFICIT GENERAL Goal: RH STG INCREASE KNOWLEDGE OF SELF CARE AFTER HOSPITALIZATION Description: Patient and spouse will be able to manage care using educational resources for medications, diet independently Outcome: Progressing   Problem: Education: Goal: Ability to describe self-care measures that may prevent or decrease complications (Diabetes Survival Skills Education) will improve Outcome: Progressing Goal: Individualized Educational Video(s) Outcome: Progressing   Problem: Coping: Goal: Ability to adjust to condition or change in health will improve Outcome: Progressing   Problem: Fluid Volume: Goal: Ability to maintain a balanced intake and output will improve Outcome: Progressing   Problem: Health Behavior/Discharge Planning: Goal: Ability to identify and utilize available resources and services will  improve Outcome: Progressing Goal: Ability to manage health-related needs will improve Outcome: Progressing   Problem: Metabolic: Goal: Ability to maintain appropriate glucose levels will improve Outcome: Progressing   Problem: Nutritional: Goal: Maintenance of adequate nutrition will improve Outcome: Progressing Goal: Progress toward achieving an optimal weight will improve Outcome: Progressing   Problem: Skin Integrity: Goal: Risk for impaired skin integrity will decrease Outcome: Progressing   Problem: Tissue Perfusion: Goal: Adequacy of tissue perfusion will improve Outcome: Progressing   Problem: RH Vision Goal: RH LTG Vision (Specify) Outcome: Progressing

## 2024-06-02 NOTE — Progress Notes (Signed)
 Occupational Therapy Session Note  Patient Details  Name: [name] [name] MRN: [id number] Date of Birth: 1945/12/05  Today's Date: 06/02/2024 OT Individual Time: 9054-8942 OT Individual Time Calculation (min): 72 min   Short Term Goals: Week 3:  OT Short Term Goal 1 (Week 3): STGs=LTGs d/t ELOS  Skilled Therapeutic Interventions/Progress Updates:     Pt received semi-reclined in bed presenting to be in good spirits receptive to skilled OT session reporting 0/10 pain- OT offering intermittent rest breaks, repositioning, and therapeutic support to optimize participation in therapy session. Focused this session on ADL retraining and functional transfer training to increased overall independence and decrease bourdon of care.   Mobility:  -Bed Mobility: Supine > EOB with HOB elevated light MIN A to lift trunk and verbal cues for technique +increased time. Pt easily distracted during bed mobility and slowed initiation noted.  -Stand pivot: stand pivots completed EOB > wc using RW with light MOD A required to power up from low bed and light MOD A for balance and RW management d/t first stand of the day. Stand pivot wc > elevated toilet seat MIN A for balance and RW management +verbal cues for trunk positioning.  -Ambulatory transfers: He completed short distance functional mobility elevated toilet seat > TTB positioned in walk-in shower w/RW heavy MIN A required for balance and RW management skills +consistent verbal cues for trunk positioning, MOD A required to correct LOB x2 trials. Pt noted to have R lean and intermittent R knee, buckling however able to correct with verbal and tactile cuing. Following shower, he completed functional mobility TTB > WC using RW ~10 ft at same level of assist- improved ability to correct postural alignment and hip positioning this trial.  ADL:  -Toileting: Pt requesting to use restroom at beginning of session, attempted however no void at this time. Increased  challenges noted maintaining balance when releasing single UE support to pull pants down.  -Bathing: Majority of U/LB bathing completed in seated position on TTB for safety and energy conservation. Able to utilize long handled sponge to wash lower B LEs and feet. Stood to [employer] with MIN A using grab bars and maintained standing balance with CGA while OT washed buttocks.  -Dressing. U/LB dressing completed while seated in wc. Re-educated Pt on utilizing reacher for LB dressing to improve carryover of technique. Pt able to weave feet into pants with SUP +increased time and MOD verbal cues. Stood to [employer] with CGA and brought pants to waist with MIN A provided for standing balance. Socks/shoes donned total A for time management. OH shirt donned with SUP, MIN verbal cues required for initiation.  -Grooming/hygiene: Brushed teeth, applied deodorant, and groomed hair with set-up A seated in wc.   Pt able to recall all activities completed during session with MIN questioning cues at end of session during memory book activity. Pt was left resting in wc with call bell in reach, seatbelt alarm on, and all needs met.     Therapy Documentation Precautions:  Precautions Precautions: Fall Recall of Precautions/Restrictions: Impaired Precaution/Restrictions Comments: incision near L clavicle (axillary artery) and bil groins; L weakness Restrictions Weight Bearing Restrictions Per Provider Order: No   Therapy/Group: Individual Therapy  Katheryn SHAUNNA [place] 06/02/2024, 10:10 AM

## 2024-06-03 LAB — CBC
HCT: 29.6 % — ABNORMAL LOW (ref 39.0–52.0)
Hemoglobin: 9.2 g/dL — ABNORMAL LOW (ref 13.0–17.0)
MCH: 27.7 pg (ref 26.0–34.0)
MCHC: 31.1 g/dL (ref 30.0–36.0)
MCV: 89.2 fL (ref 80.0–100.0)
Platelets: 196 K/uL (ref 150–400)
RBC: 3.32 MIL/uL — ABNORMAL LOW (ref 4.22–5.81)
RDW: 14.6 % (ref 11.5–15.5)
WBC: 7.1 K/uL (ref 4.0–10.5)
nRBC: 0 % (ref 0.0–0.2)

## 2024-06-03 LAB — GLUCOSE, CAPILLARY
Glucose-Capillary: 121 mg/dL — ABNORMAL HIGH (ref 70–99)
Glucose-Capillary: 117 mg/dL — ABNORMAL HIGH (ref 70–99)
Glucose-Capillary: 180 mg/dL — ABNORMAL HIGH (ref 70–99)
Glucose-Capillary: 113 mg/dL — ABNORMAL HIGH (ref 70–99)

## 2024-06-03 NOTE — Progress Notes (Signed)
 Occupational Therapy Session Note  Patient Details  Name: [name] [name] MRN: [id number] Date of Birth: 1946/02/15  Today's Date: 06/03/2024 OT Individual Time: 1300-1344 OT Individual Time Calculation (min): 44 min    Short Term Goals: Week 3:  OT Short Term Goal 1 (Week 3): STGs=LTGs d/t ELOS  Skilled Therapeutic Interventions/Progress Updates:     Pt received sitting up in wc, finishing his lunch. Pt presenting to be in good spirits receptive to skilled OT session reporting 0/10 pain- OT offering intermittent rest breaks, repositioning, and therapeutic support to optimize participation in therapy session. Allowed time at beginning of session for Pt to finish lunch with education provided on importance of nutritional intake to support recovery process with Pt receptive. Discussed d/c plans and provided recommendations for home set-up to optimize independence in ADLs and increase safety. Transported Pt total A to outdoor location in wc for energy conservation and time management. Session completed outdoors to support moral and participation. Re-educated Pt on sit<>stand technique, importance of body positioning during functional mobility, and sequencing with RW. Pt then completed 2 bouts of functional mobility ~25 ft each trials with slowed cadence- focused on body alignment and RW management skills. He required CGA-MIN A for functional mobility and MIN A for power-up/slowed decent during sit<>stand. Pt with improved trunk positioning overall correcting hip/postural alignment during functional mobility with MIN questioning cues, however when he fatigues increased forwards flexed posture noted. MOD verbal cues required for recall of RW positioning during functional mobility. Transported Pt back to room total A in wc. Pt was left resting in wc with call bell in reach, seatbelt alarm on, and all needs met.    Therapy Documentation Precautions:  Precautions Precautions: Fall Recall of  Precautions/Restrictions: Impaired Precaution/Restrictions Comments: incision near L clavicle (axillary artery) and bil groins; L weakness Restrictions Weight Bearing Restrictions Per Provider Order: No   Therapy/Group: Individual Therapy  Katheryn SHAUNNA [place] 06/03/2024, 3:14 PM

## 2024-06-03 NOTE — Progress Notes (Signed)
 Patient ID: [name] [name], male   DOB: 30-Dec-1945, 78 y.o.   MRN: [id number]  Met with pt and spoke with wife via telephone who is waiting for the furniture to be delivered. Made aware of his progress and still plan to discharge him 10/21. Have set up family education with wife for Friday at 1;00 and will continue to work on discharge needs. She has asked the family to assist her with pt and hopefully they will. Pt will be high risk to fall if he is not cued on when he is up moving.

## 2024-06-03 NOTE — Patient Care Conference (Signed)
 Inpatient RehabilitationTeam Conference and Plan of Care Update Date: 06/03/2024   Time: 10:33 AM    Patient Name: [name] [name]      Medical Record Number: [id number]  Date of Birth: [birth year] Sex: Male         Room/Bed: 4M08C/4M08C-01 Payor Info: Payor: Multimedia programmer / Plan: [employer] MEDICARE / Product Type: *No Product type* /    Admit Date/Time:  05/17/2024  5:41 PM  Primary Diagnosis:  Cerebral infarction involving right cerebellar artery [facility])  Hospital Problems: Principal Problem:   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) Active Problems:   Stroke (cerebrum) [facility])    Expected Discharge Date: Expected Discharge Date: 06/09/24  Team Members Present: Physician leading conference: Dr. Prentice Compton Social Worker Present: Rhoda Clement, LCSW Nurse Present: Barnie Ronde, RN PT Present: Recardo Milliner, PT OT Present: Katheryn Mines, OT SLP Present: Recardo Mole, SLP PPS Coordinator present : Eleanor Colon, SLP     Current Status/Progress Goal Weekly Team Focus  Bowel/Bladder      Mixed continence    Continent of bowel and bladder    Toileting protocol  Swallow/Nutrition/ Hydration               ADL's   UB dressing SUP (cues for initation), LB dressing MIN A using reacher (when focused), toileting MIN A (occasionally MOD A if fatigued), bathing MIN A seated on TTB using LHS, stand pivot toilet transfers CGA-MIN A using RW (verbal cues for trunk/LE positioning), grooming/hygine set-up   UB set-up;  LB dress, bathing, toileting, toilet transfers MIN A; toilet transfer CGA   ADL retraining, cognitive training, balance, functional mobility, endurance, informal Pt/fam education (would benefit from additional family education)    Mobility   Bed mobility = MinA, transfers = CGA with intermittent MinA for posterior bias, ambulation = up to 100 ft mostly CGA with forward positioning of RW with fatigue and L hip weakness can increase L lean with  fatigue, w/c mobility = close supervision with vc for technique   supervision/ CGA for bed mobility and transfers, MinA for ambulation, supervision for w/c mobility  Barriers: L hip and generalized weakness with mild L hemipareisis, cognition/ memory /// Work on: general strengthening, LOA in all mobility, quality of gait, standing balance and posture, family education    Communication                Safety/Cognition/ Behavioral Observations  moderate cognitive deficits remain: memory, thought formulation/organization, problem solving, word finding, awareness   supervision- minA   cognitive re training, compensatory memory strategies, pt/family education    Pain      Pain addressed; chronic lumbar stenosis    Pain < 4 with prns   Assess need for and effectiveness of prn meds   Skin      N/a           Discharge Planning:  Wife has been here to observe and participate in therapies, when asked if can do his care she is not sure. She is asking other family members if can assist at discharge. Aware of services available and seems to want more assist with him from them at home.   Team Discussion: Patient admitted post right cerebellar CVA with SVD and white matter disease/vascular dementia (moderately severe cognitive deficits) with proximal weakness, and double vision. Functional status varies with fatigue and poor attention, posterior lean and poor attention with spinal stenosis/back pain with new hip weakness.   Patient on target to meet  rehab goals: yes, currently needs supervision for upper body care and min assist for lower body using a reacher while seated.  Needs min assist for toileting. Able to ambulate up to 100' with CGA - supervision and cues for safety.  Moderate cognitive deficits requiring moderate cues for thought, awareness and sustained attention with decreased distractions during tasks.  *See Care Plan and progress notes for long and short-term goals.   Revisions  to Treatment Plan:  N/a   Teaching Needs: Safety, medications, transfers, toileting, etc.   Current Barriers to Discharge: Decreased caregiver support and Home enviroment access/layout  Possible Resolutions to Barriers: Family education HH follow up services DME: [facility]     Medical Summary Current Status: LE weakness improving , bowel and bladder mixed inc and continent  Barriers to Discharge: Medical stability   Possible Resolutions to [employer] Focus: Ortho follow up, work on bowel and bladder continence   Continued Need for Acute Rehabilitation Level of Care: The patient requires daily medical management by a physician with specialized training in physical medicine and rehabilitation for the following reasons: Direction of a multidisciplinary physical rehabilitation program to maximize functional independence : Yes Medical management of patient stability for increased activity during participation in an intensive rehabilitation regime.: Yes Analysis of laboratory values and/or radiology reports with any subsequent need for medication adjustment and/or medical intervention. : Yes   I attest that I was present, lead the team conference, and concur with the assessment and plan of the team.   [name] 06/03/2024, 2:08 PM

## 2024-06-03 NOTE — Progress Notes (Signed)
 Physical Therapy Session Note  Patient Details  Name: [name] [name] MRN: [id number] Date of Birth: 1946-04-28  Today's Date: 06/03/2024 PT Individual Time: 1407-1505 PT Individual Time Calculation (min): 58 min   Short Term Goals: Week 2:  PT Short Term Goal 1 (Week 2): Pt will be able to perform bed mobility with consistent MinA PT Short Term Goal 2 (Week 2): Pt will be able to perform transfers bed <> w/c with consistent Min A PT Short Term Goal 3 (Week 2): Pt will demonstrate dynamic sitting balance with CGA PT Short Term Goal 4 (Week 2): Pt will ambulate >50 ft consistently with CGA/ MinA.   Skilled Therapeutic Interventions/Progress Updates:    Patient seated upright in w/c on entrance to room. Patient alert and agreeable to PT session. Wife present.  Patient with no pain complaint at start of session.  Therapeutic Activity: Transfers: Pt performed sit<>stand and stand pivot transfers throughout session with SBA until final sit<>stand with fatigue requiring CGA d/t posterior bias. Provided vc/ tc for forward weight shift and head/ shoulders forward with pressure into toes as well as into hands on RW with more forward walker placement.  Gait Training:  Pt ambulated  73' x1/ 42' x1/ 23' x1 using RW with overall CGA/ SBA. Demonstrated forward flexed posture. Is able to variably self correct for more upright posture especially with stance time to RLE. Continued L trendelenberg addressed with tactile facilitation at lateral hip musculature. Posture addressed with NDT cues at low back for paraspinals and at collarbone for scapular retraction and more upright posturing. Provided vc/ tc for LLE step height prn.  Wheelchair Mobility:  Pt propelled wheelchair 172 feet with supervision overall. Provided vc/ tc for technique using BUE and BLE for energy conservation. Intermittent vc for LLE attention. VC also for more efficient and effective 90* turn into doorways.   Therapeutic Exercise: Pt  performed the following exercises with vc/ tc for proper technique: Standing hip abduction Standing hip abd/ ext to 45* posterior angle Standing hip extension Standing marches Standing minisquats  Pt guided in performance bilaterally for 2 sets x10 reps. Guided in proper direction and technique using clock face analogy which pt understands clearly.   Patient seated upright in w/c at end of session with brakes locked, belt alarm set, and all needs within reach.   Therapy Documentation Precautions:  Precautions Precautions: Fall Recall of Precautions/Restrictions: Impaired Precaution/Restrictions Comments: incision near L clavicle (axillary artery) and bil groins; L weakness Restrictions Weight Bearing Restrictions Per Provider Order: No  Pain:      Therapy/Group: Individual Therapy  [name] PT, DPT, CSRS 06/03/2024, 5:20 AM

## 2024-06-03 NOTE — Progress Notes (Signed)
 Speech Language Pathology Daily Session Note  Patient Details  Name: [name] [name] MRN: [id number] Date of Birth: May 22, 1946  Today's Date: 06/03/2024 SLP Individual Time: 0900-1000 SLP Individual Time Calculation (min): 60 min  Short Term Goals: Week 3: SLP Short Term Goal 1 (Week 3): STGs=LTGs due to ELOS  Skilled Therapeutic Interventions:   Pt greeted at bedside. He was agreeable to tx tasks targeting cognition. He was able to utilize his calendar to ID the date (within one day). SLP then facilitated a matching task targeting sustained attention and problem solving. With additional time, he completed the task w/ only supervisionA for attention to details. After, SLP challenged him to recall task re his next therapy. He was able to immediately recall 2/2 details. After ~10 min delay, he recalled 1/2 details and after additional 5 min delay, he recalled 2/2 details. Between recall times, he completed a verbal organization task comparing/contrasting 2 household items. He benefited from minA for thought formulation and thoroughness. At the end of tx tasks, he was left in bed w/ the alarm set. Nursing present at bedside. Recommend cont ST per POC.   Pain  None reported  Therapy/Group: Individual Therapy  [name] 06/03/2024, 9:34 AM

## 2024-06-03 NOTE — Progress Notes (Signed)
 Physical Therapy Weekly Progress Note  Patient Details  Name: [name] [name] MRN: [id number] Date of Birth: 08-02-1946  Beginning of progress report period: {Time; dates multiple:304500300} End of progress report period: {Time; dates multiple:304500300}  Today's Date: 06/03/2024 PT Individual Time: 1518-1608 PT Individual Time Calculation (min): 50 min   Patient has met {number 1-5:22450} of {number 1-5:20334} short term goals.  Pt making appropriate progress towards goals and is on track to meet LTG. She has/ has not*** missed a few days of therapy due to ***. She completes bed mobility with ***, sit<>stand transfers with ***, and stand<>pivot transfers with *** using ***. She's ambulating ***ft with *** using ***. She has also shown ability to navigate *** 6-in steps with *** using *** handrails. She continues to be primarily limited by premorbid deconditioning, body habitus, decreased dynamic standing balance, *UE sensory deficits, RUE and RLE weakness, and gait impairments. Caregiver/ family *** has/ have not*** participated in family education to prepare for d/c home.   Patient continues to demonstrate the following deficits {impairments:3041632} and therefore will continue to benefit from skilled PT intervention to increase functional independence with mobility.  Patient {LTG progression:3041653}.  {plan of rjmz:6958345}  PT Short Term Goals {DUH:6958314}  Skilled Therapeutic Interventions/Progress Updates:      Skilled Therapeutic Session: Patient *** on entrance to room. Patient alert and agreeable to PT session.   Patient with no pain complaint at start of session.  Therapeutic Activity: Bed Mobility: Pt performed supine <> sit with ***. VC/ tc required for ***. Transfers: Pt performed sit<>stand and stand pivot transfers throughout session with ***. Provided vc/ tc for***.  Gait Training:  Pt ambulated *** ft using *** with ***. Demonstrated ***. Provided vc/ tc for  ***.  Wheelchair Mobility:  Pt propelled wheelchair *** feet with ***. Provided vc/ tc for ***.  Neuromuscular Re-ed: NMR facilitated during session with focus on ***. Pt guided in ***. NMR performed for improvements in motor control and coordination, balance, sequencing, judgement, and self confidence/ efficacy in performing all aspects of mobility at highest level of independence.   Therapeutic Exercise: Pt performed the following exercises with vc/ tc for proper technique. ***  Patient *** at end of session with brakes locked, *** alarm set, and all needs within reach.   Therapy Documentation Precautions:  Precautions Precautions: Fall Recall of Precautions/Restrictions: Impaired Precaution/Restrictions Comments: incision near L clavicle (axillary artery) and bil groins; L weakness Restrictions Weight Bearing Restrictions Per Provider Order: No General:   Vital Signs: Therapy Vitals Temp: 98.1 F (36.7 C) Pulse Rate: 73 Resp: 18 BP: 134/88 Patient Position (if appropriate): Lying Oxygen Therapy SpO2: 97 % O2 Device: Room Air Pain:   Vision/Perception     Mobility:   Locomotion :    Trunk/Postural Assessment :    Balance:   Exercises:   Other Treatments:     Therapy/Group: {Therapy/Group:3049007}  [name] [name] 06/03/2024, 8:02 AM

## 2024-06-03 NOTE — Group Note (Signed)
 Patient Details Name: [name] [name] MRN: [id number] DOB: 1945/11/08 Today's Date: 06/03/2024  Time Calculation:   PT Group Time Calculation PT Group Start Time: 1030 PT Group Stop Time: 1130 PT Group Time Calculation (min): 60 min    Group Description: BUE Therex Group: Pt participated in group session with a focus on BUE strength and endurance to facilitate improved activity tolerance and strength for higher level BADLs and functional mobility tasks.   Individual level documentation: Pt first in engaged in seated warm up where pt completed shoulder rolls, shrugs, and cx ROM. [name] [name]  Next, pt completed seated bicep curls, shoulder flexion/extension, forward punches, and upright rows with red theraband. Patient able to complete x15 reps or to fatigue. Pt also participated in D1 PNF chops, triceps extension, and beach ball toss for dynamic reaching and balance.   Ended session with Ended session with UE stretching and cx ROM per tolerance followed by deep breathing. Pt transported back to room dependently. Pt left seated with all needs within reach and bed alarm/chair alarm activated.   Pain:  Denies  Precautions:     [name] 06/03/2024, 12:06 PM

## 2024-06-03 NOTE — Progress Notes (Signed)
 PROGRESS NOTE   Subjective/Complaints:  No new issues, no c/os  ROS-   Pt denies fever, SOB, chest pain, abd pain,  N/V/C/D, new motor or sensory changes   Objective:  MRI LUMBAR SPINE WITHOUT CONTRAST   TECHNIQUE: Multiplanar, multisequence MR imaging of the lumbar spine was performed. No intravenous contrast was administered.   COMPARISON:  MRI of the lumbar spine March 06, 2018 and January 23, 2013   FINDINGS: Segmentation: A transitional lumbosacral vertebra is assumed to represent the L5 level. Careful correlation with this numbering strategy prior to any procedural intervention would be recommended.   Alignment:  Physiologic.   Vertebrae: Interbody fusion and posterior left transpedicular fixation is noted at L3-4 with laminectomy at L4-5. Degenerative endplate changes are noted at L3-4. Marrow signal characteristics are otherwise maintained.   Conus medullaris and cauda equina: Conus extends to the T12-L1 level. Conus appear normal. Redundancy of nerve roots at the level of L1 is related to L2-3 spinal canal stenosis.   Paraspinal and other soft tissues: Well-defined oval lesion located to the left of midline in the prevertebral soft tissue at the level of L5-S1 is seen in the sagittal views. The lesion as predominantly hypointense signal on T1 and heterogeneous signal on T2. When compared to prior study, the lesion appears with similar size (3.7 x 2.7 cm) when compared to study performed 2019 but enlarged when compared to MRI performed 2014. T2 signal appear changed when compared to prior study.   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   Right renal cysts.   Disc levels:   T12-L1: No spinal canal or neural foraminal stenosis.   L1-2: No spinal canal or neural foraminal stenosis.   L2-3: Disc bulge with superimposed tiny right central disc protrusion, facet  degenerative changes and ligamentum flavum redundancy resulting in moderate to severe spinal canal stenosis, moderate right and severe left neural foraminal stenosis.   L3-4: Laminectomy with facet fusion. No spinal canal or neural foraminal stenosis.   L4-5: Disc bulge with tiny superimposed central disc protrusion and mild facet degenerative changes result in severe bilateral neural foraminal narrowing. Laminectomy is also noted with no spinal canal stenosis.   L5-S1: No spinal canal or neural foraminal stenosis.   Compared to prior study, there has been significant progression of the degree of spinal canal stenosis at L2-3.   IMPRESSION: 1. 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 This has progressed when compared to prior MRI. 2. Well-defined oval lesion the prevertebral soft tissues to the left of midline at the L5-S1 level, only seen on the sagittal images. This appear with similar size although slightly different T2 signal when compared to study performed 2019 but enlarged when compared to study performed in 2014. 3. 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 Recommend followup by ultrasound in 2 years. This recommendation follows ACR consensus guidelines: White Paper of the ACR Incidental Findings Committee II on Vascular Findings. J Am Coll Radiol 2013; 10:     Electronically Signed   By:  [name] M.D.   On: 10/22/2019 14:06 No results found.  Recent Labs    06/01/24 0527  WBC 9.6  HGB 9.2*  HCT 28.8*  PLT 209     Recent Labs    06/01/24 0527  NA 140  K 4.6  CL 106  CO2 25  GLUCOSE 131*  BUN 13  CREATININE 1.40*  CALCIUM  9.0      Intake/Output Summary (Last 24 hours) at 06/03/2024 0830 Last data filed at 06/02/2024 1807 Gross per 24 hour   Intake 720 ml  Output --  Net 720 ml        Physical Exam: Vital Signs Blood pressure 134/88, pulse 73, temperature 98.1 F (36.7 C), resp. rate 18, height 5' 11 (1.803 m), weight 85 kg, SpO2 97%.   General: No acute distress laying in bed appears comfortable Mood and affect are appropriate Heart: Regular rate and rhythm no rubs murmurs or extra sounds Lungs: Clear to auscultation, breathing unlabored, no rales or wheezes Abdomen: Positive bowel sounds, soft nontender to palpation, nondistended No inguinal mass or lesions or tenderness on RIght side  Extremities: No clubbing, cyanosis, or edema Skin: No evidence of breakdown, no evidence of rash, IV infiltration with RUE forearm but not hand swelling , no erythema or tenderness  Neurologic: Cranial nerves II through XII intact, motor strength is 5/5 in bilateral deltoid, bicep, tricep, grip,3/5 R RIght and 3-/5 left  hip flexor, 4- Right and 3- left knee extensors, 5/5 RIght and 4/5 left ankle dorsiflexor and plantar flexor   Musculoskeletal: Full range of motion in all 4 extremities. No joint swelling Oriented to [place] and place but not time, delayed responses   Prior neuro assessment is c/w today's exam 06/03/2024.   Assessment/Plan: 1. Functional deficits which require 3+ hours per day of interdisciplinary therapy in a comprehensive inpatient rehab setting. Physiatrist is providing close team supervision and 24 hour management of active medical problems listed below. Physiatrist and rehab team continue to assess barriers to discharge/monitor patient progress toward functional and medical goals  Care Tool:  Bathing    Body parts bathed by patient: Right arm, Left arm, Chest, Front perineal area, Abdomen, Right upper leg, Left upper leg, Face, Right lower leg, Left lower leg   Body parts bathed by helper: Buttocks     Bathing assist Assist Level: Minimal Assistance - Patient > 75%     Upper Body  Dressing/Undressing Upper body dressing   What is the patient wearing?: Pull over shirt    Upper body assist Assist Level: Set up assist    Lower Body Dressing/Undressing Lower body dressing      What is the patient wearing?: Underwear/pull up, Pants     Lower body assist Assist for lower body dressing: Moderate Assistance - Patient 50 - 74%     Toileting Toileting    Toileting assist Assist for toileting: Maximal Assistance - Patient 25 - 49%     Transfers Chair/bed transfer  Transfers assist     Chair/bed transfer assist level: Minimal Assistance - Patient > 75%     Locomotion Ambulation   Ambulation assist   Ambulation activity did not occur: Safety/medical concerns  Assist level: Minimal Assistance - Patient > 75% Assistive device: Walker-rolling Max distance: 10   Walk 10 feet activity   Assist  Walk 10 feet activity did not occur: Safety/medical concerns  Assist level: Minimal Assistance - Patient > 75% Assistive device: Walker-rolling   Walk 50 feet  activity   Assist Walk 50 feet with 2 turns activity did not occur: Safety/medical concerns         Walk 150 feet activity   Assist Walk 150 feet activity did not occur: Safety/medical concerns         Walk 10 feet on uneven surface  activity   Assist Walk 10 feet on uneven surfaces activity did not occur: Safety/medical concerns         Wheelchair     Assist Is the patient using a wheelchair?: Yes Type of Wheelchair: Manual    Wheelchair assist level: Supervision/Verbal cueing Max wheelchair distance: 100'    Wheelchair 50 feet with 2 turns activity    Assist        Assist Level: Supervision/Verbal cueing   Wheelchair 150 feet activity     Assist      Assist Level: Minimal Assistance - Patient > 75%   Blood pressure 134/88, pulse 73, temperature 98.1 F (36.7 C), resp. rate 18, height 5' 11 (1.803 m), weight 85 kg, SpO2 97%.  Medical Problem  List and Plan: 1. 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 Formulized individual treatment plan based on medical history, underlying problem and comorbidities.  2.  Antithrombotics: -DVT/anticoagulation:  Pharmaceutical: Lovenox , age indeterminate asymptomatic Left Fem DVT, per Dr Sheree [facility], cont current meds, no activity restriction              -antiplatelet therapy: continue ASA   3. Pain Management: continue Oxycodone  prn for severe and tylenol  prn for mild pain   10/4-10/5- denies pain at all this AM- con't regimen 4. Mood/Behavior/Sleep: LCSW to follow for evaluation and support.              -antipsychotic agents: N/A   5. Neuropsych/cognition: This patient is not capable of making decisions on his own behalf. 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 Skin/Wound Care: Routine pressure relief measures. Monitor incision for healing Right antecubital IV infiltration    7. Fluids/Electrolytes/Nutrition:  Monitor I/O. Check CMET in am             --offer supplement prn   8. T2DM: Monitor BS ac/hs and use SSI for elevated BS             --Was on metform 500 mg bid--held due to dye study, consider resuming once kidney function improves   10/4- BG's running 100-118- con't to monitor  10-13 CBG is 108-127, fairly well-controlled continue to monitor  CBG (last 3)  Recent Labs    06/02/24 1637 06/02/24 2121 06/03/24 0545  GLUCAP 140* 147* 121*     9.HTN: Monitor BP TID--off home meds at this time. BP reviewed and is starting to increase, will start amlodipine  2.5mg  daily Vitals:   06/02/24 2116 06/03/24 0547  BP: (!) 144/98 134/88   Pulse: 76 73  Resp: 18 18  Temp: 98 F (36.7 C) 98.1 F (36.7 C)  SpO2: 99% 97%  Increase amlodipine  to 10mg  daily - sys elevation mild improving 10/12 Mld systolic elevation    10. AKI:  BUN/SCr 11/1.21 at admission with rise to 19/1.54 post procedure                 Latest Ref Rng & Units 06/01/2024    5:27 AM 05/28/2024    5:22 AM 05/27/2024    3:23 PM  BMP  Glucose 70 - 99 mg/dL 868  888  851   BUN 8 - 23 mg/dL 13  23  24    Creatinine 0.61 - 1.24 mg/dL 8.59  8.29  8.24   Sodium 135 - 145 mmol/L 140  142  138   Potassium 3.5 - 5.1 mmol/L 4.6  3.8  4.0   Chloride 98 - 111 mmol/L 106  108  105   CO2 22 - 32 mmol/L 25  23  19    Calcium  8.9 - 10.3 mg/dL 9.0  8.7  9.3    Recheck 10/13 BUN and creatinine improved to 13/1.4 Labs stable , no orthostatic hypotension  d/c IVF after current bag 11. Thrombocytopenia: resolved now in high normal range   12.  ABLA: Recheck stable no hematuria, melena noted, cont to monitor at least weekly - stable     Latest Ref Rng & Units 06/01/2024    5:27 AM 05/28/2024    5:22 AM 05/27/2024    3:23 PM  CBC  WBC 4.0 - 10.5 K/uL 9.6  11.7  20.4   Hemoglobin 13.0 - 17.0 g/dL 9.2  9.1  89.8   Hematocrit 39.0 - 52.0 % 28.8  28.4  32.5   Platelets 150 - 400 K/uL 209  304  429   Dropped a point after IVF, recheck stable, likely reflects improved hydration status    - 13. Leucocytosis: resolved   - 10/13 WBC 9.6 today within normal limits   14.  H/o dementia vascular: continue Namenda .  Has been seeing neuro as outpt   15.  Bilateral LE weakness proximally and Left distal LE weakness with hx Lumbar stenosis , suspect progression since prior Lumbar MRI in 2021, pt has had L3-4 fusion per Dr Beuford in 2021- some improvement in LE strength , pt feels decline in strength was post op after aneurysm repair, however pt has hx of dementia and is poor historian, had Hospital admit July 2025 and was reportedly using walker at baseline and went to SNF.  Using  walker at OV with [facility] 04/08/2024 Pt had repeat MRI lumbar showing progressive L2-3 central stenosis , severe bilateral L4-5 foraminal stenosis, intrathecal T1 signal at L5-S1 Discussed case with Dr Beuford.  Will need f/u with Dr Beuford post discharge    LOS: 17 days A FACE TO FACE EVALUATION WAS PERFORMED  Prentice FORBES Compton 06/03/2024, 8:30 AM

## 2024-06-04 LAB — GLUCOSE, CAPILLARY
Glucose-Capillary: 108 mg/dL — ABNORMAL HIGH (ref 70–99)
Glucose-Capillary: 116 mg/dL — ABNORMAL HIGH (ref 70–99)
Glucose-Capillary: 174 mg/dL — ABNORMAL HIGH (ref 70–99)
Glucose-Capillary: 138 mg/dL — ABNORMAL HIGH (ref 70–99)

## 2024-06-04 LAB — BASIC METABOLIC PANEL WITH GFR
Anion gap: 10 (ref 5–15)
BUN: 18 mg/dL (ref 8–23)
CO2: 23 mmol/L (ref 22–32)
Calcium: 9 mg/dL (ref 8.9–10.3)
Chloride: 108 mmol/L (ref 98–111)
Creatinine, Ser: 1.46 mg/dL — ABNORMAL HIGH (ref 0.61–1.24)
GFR, Estimated: 49 mL/min — ABNORMAL LOW (ref >60)
Glucose, Bld: 112 mg/dL — ABNORMAL HIGH (ref 70–99)
Potassium: 3.8 mmol/L (ref 3.5–5.1)
Sodium: 141 mmol/L (ref 135–145)

## 2024-06-04 LAB — CBC
HCT: 27.9 % — ABNORMAL LOW (ref 39.0–52.0)
Hemoglobin: 9 g/dL — ABNORMAL LOW (ref 13.0–17.0)
MCH: 28.5 pg (ref 26.0–34.0)
MCHC: 32.3 g/dL (ref 30.0–36.0)
MCV: 88.3 fL (ref 80.0–100.0)
Platelets: 186 K/uL (ref 150–400)
RBC: 3.16 MIL/uL — ABNORMAL LOW (ref 4.22–5.81)
RDW: 14.6 % (ref 11.5–15.5)
WBC: 5.9 K/uL (ref 4.0–10.5)
nRBC: 0 % (ref 0.0–0.2)

## 2024-06-04 NOTE — Progress Notes (Signed)
 PROGRESS NOTE   Subjective/Complaints:  No issues overnight remains in good spirits the nose and the back pain at this time  ROS-   Pt denies fever, SOB, chest pain, abd pain,  N/V/C/D, new motor or sensory changes   Objective:  MRI LUMBAR SPINE WITHOUT CONTRAST   TECHNIQUE: Multiplanar, multisequence MR imaging of the lumbar spine was performed. No intravenous contrast was administered.   COMPARISON:  MRI of the lumbar spine March 06, 2018 and January 23, 2013   FINDINGS: Segmentation: A transitional lumbosacral vertebra is assumed to represent the L5 level. Careful correlation with this numbering strategy prior to any procedural intervention would be recommended.   Alignment:  Physiologic.   Vertebrae: Interbody fusion and posterior left transpedicular fixation is noted at L3-4 with laminectomy at L4-5. Degenerative endplate changes are noted at L3-4. Marrow signal characteristics are otherwise maintained.   Conus medullaris and cauda equina: Conus extends to the T12-L1 level. Conus appear normal. Redundancy of nerve roots at the level of L1 is related to L2-3 spinal canal stenosis.   Paraspinal and other soft tissues: Well-defined oval lesion located to the left of midline in the prevertebral soft tissue at the level of L5-S1 is seen in the sagittal views. The lesion as predominantly hypointense signal on T1 and heterogeneous signal on T2. When compared to prior study, the lesion appears with similar size (3.7 x 2.7 cm) when compared to study performed 2019 but enlarged when compared to MRI performed 2014. T2 signal appear changed when compared to prior study.   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   Right renal cysts.   Disc levels:   T12-L1: No spinal canal or neural foraminal stenosis.   L1-2: No spinal canal or neural foraminal stenosis.   L2-3: Disc bulge with  superimposed tiny right central disc protrusion, facet degenerative changes and ligamentum flavum redundancy resulting in moderate to severe spinal canal stenosis, moderate right and severe left neural foraminal stenosis.   L3-4: Laminectomy with facet fusion. No spinal canal or neural foraminal stenosis.   L4-5: Disc bulge with tiny superimposed central disc protrusion and mild facet degenerative changes result in severe bilateral neural foraminal narrowing. Laminectomy is also noted with no spinal canal stenosis.   L5-S1: No spinal canal or neural foraminal stenosis.   Compared to prior study, there has been significant progression of the degree of spinal canal stenosis at L2-3.   IMPRESSION: 1. 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 This has progressed when compared to prior MRI. 2. Well-defined oval lesion the prevertebral soft tissues to the left of midline at the L5-S1 level, only seen on the sagittal images. This appear with similar size although slightly different T2 signal when compared to study performed 2019 but enlarged when compared to study performed in 2014. 3. 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 Recommend followup by ultrasound in 2 years. This recommendation follows ACR consensus guidelines: White Paper of the ACR Incidental Findings Committee II on Vascular Findings. J Am Coll Radiol  2013; 10:     Electronically Signed   By: [name] M.D.   On: 10/22/2019 14:06 No results found.  Recent Labs    06/03/24 1934 06/04/24 0552  WBC 7.1 5.9  HGB 9.2* 9.0*  HCT 29.6* 27.9*  PLT 196 186     Recent Labs    06/04/24 0552  NA 141  K 3.8  CL 108  CO2 23  GLUCOSE 112*  BUN 18  CREATININE 1.46*  CALCIUM  9.0      Intake/Output Summary (Last  24 hours) at 06/04/2024 0803 Last data filed at 06/03/2024 1800 Gross per 24 hour  Intake 574 ml  Output --  Net 574 ml        Physical Exam: Vital Signs Blood pressure 137/84, pulse 70, temperature 98.4 F (36.9 C), temperature source Oral, resp. rate 18, height 5' 11 (1.803 m), weight 85 kg, SpO2 100%.   General: No acute distress laying in bed appears comfortable Mood and affect are appropriate Heart: Regular rate and rhythm no rubs murmurs or extra sounds Lungs: Clear to auscultation, breathing unlabored, no rales or wheezes Abdomen: Positive bowel sounds, soft nontender to palpation, nondistended No inguinal mass or lesions or tenderness on RIght side  Extremities: No clubbing, cyanosis, or edema Skin: No evidence of breakdown, no evidence of rash, IV infiltration with RUE forearm but not hand swelling , no erythema or tenderness  Neurologic: Cranial nerves II through XII intact, motor strength is 5/5 in bilateral deltoid, bicep, tricep, grip,3/5 R RIght and 3-/5 left  hip flexor, 4- Right and 3- left knee extensors, 5/5 RIght and 4/5 left ankle dorsiflexor and plantar flexor   Musculoskeletal: Full range of motion in all 4 extremities. No joint swelling Oriented to [place] and place but not time, delayed responses   Prior neuro assessment is c/w today's exam 06/04/2024.   Assessment/Plan: 1. Functional deficits which require 3+ hours per day of interdisciplinary therapy in a comprehensive inpatient rehab setting. Physiatrist is providing close team supervision and 24 hour management of active medical problems listed below. Physiatrist and rehab team continue to assess barriers to discharge/monitor patient progress toward functional and medical goals  Care Tool:  Bathing    Body parts bathed by patient: Right arm, Left arm, Chest, Front perineal area, Abdomen, Right upper leg, Left upper leg, Face, Right lower leg, Left lower leg   Body parts bathed by helper:  Buttocks     Bathing assist Assist Level: Minimal Assistance - Patient > 75%     Upper Body Dressing/Undressing Upper body dressing   What is the patient wearing?: Pull over shirt    Upper body assist Assist Level: Set up assist    Lower Body Dressing/Undressing Lower body dressing      What is the patient wearing?: Underwear/pull up, Pants     Lower body assist Assist for lower body dressing: Moderate Assistance - Patient 50 - 74%     Toileting Toileting    Toileting assist Assist for toileting: Maximal Assistance - Patient 25 - 49%     Transfers Chair/bed transfer  Transfers assist     Chair/bed transfer assist level: Minimal Assistance - Patient > 75%     Locomotion Ambulation   Ambulation assist   Ambulation activity did not occur: Safety/medical concerns  Assist level: Minimal Assistance - Patient > 75% Assistive device: Walker-rolling Max distance: 10   Walk 10 feet activity   Assist  Walk 10 feet activity did not  occur: Safety/medical concerns  Assist level: Minimal Assistance - Patient > 75% Assistive device: Walker-rolling   Walk 50 feet activity   Assist Walk 50 feet with 2 turns activity did not occur: Safety/medical concerns         Walk 150 feet activity   Assist Walk 150 feet activity did not occur: Safety/medical concerns         Walk 10 feet on uneven surface  activity   Assist Walk 10 feet on uneven surfaces activity did not occur: Safety/medical concerns         Wheelchair     Assist Is the patient using a wheelchair?: Yes Type of Wheelchair: Manual    Wheelchair assist level: Supervision/Verbal cueing Max wheelchair distance: 100'    Wheelchair 50 feet with 2 turns activity    Assist        Assist Level: Supervision/Verbal cueing   Wheelchair 150 feet activity     Assist      Assist Level: Minimal Assistance - Patient > 75%   Blood pressure 137/84, pulse 70, temperature 98.4 F  (36.9 C), temperature source Oral, resp. rate 18, height 5' 11 (1.803 m), weight 85 kg, SpO2 100%.  Medical Problem List and Plan: 1. 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  Antithrombotics: -DVT/anticoagulation:  Pharmaceutical: Lovenox , age indeterminate asymptomatic Left Fem DVT, per Dr Sheree [facility], cont current meds, no activity restriction              -antiplatelet therapy: continue ASA   3. Pain Management: continue Oxycodone  prn for severe and tylenol  prn for mild pain   10/4-10/5- denies pain at all this AM- con't regimen 4. Mood/Behavior/Sleep: LCSW to follow for evaluation and support.              -antipsychotic agents: N/A   5. Neuropsych/cognition: This patient is not capable of making decisions on his own behalf. 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 Skin/Wound Care: Routine pressure relief measures. Monitor incision for healing Right antecubital IV infiltration    7. Fluids/Electrolytes/Nutrition:  Monitor I/O. Check CMET in am             --offer supplement prn   8. T2DM: Monitor BS ac/hs and use SSI for elevated BS             --Was on metform 500 mg bid--held due to dye study, consider resuming once kidney function improves   10/4- BG's running 100-118- con't to monitor  10-13 CBG is 108-127, fairly well-controlled continue to monitor  CBG (last 3)  Recent Labs    06/03/24 1715 06/03/24 2202 06/04/24 0621  GLUCAP 180* 113* 108*     9.HTN: Monitor BP TID--off home meds at this time. BP reviewed and is starting to increase, will start amlodipine  2.5mg  daily Vitals:   06/04/24 0304 06/04/24 0331  BP: 137/84   Pulse: 70   Resp: 18   Temp: 98.4 F (36.9 C)   SpO2: 94% 100%  Increase amlodipine  to 10mg  daily - sys elevation mild improving  10/12 Mld systolic elevation    10. AKI: BUN/SCr 11/1.21 at admission with rise to 19/1.54 post procedure  Latest Ref Rng & Units 06/04/2024    5:52 AM 06/01/2024    5:27 AM 05/28/2024    5:22 AM  BMP  Glucose 70 - 99 mg/dL 887  868  888   BUN 8 - 23 mg/dL 18  13  23    Creatinine 0.61 - 1.24 mg/dL 8.53  8.59  8.29   Sodium 135 - 145 mmol/L 141  140  142   Potassium 3.5 - 5.1 mmol/L 3.8  4.6  3.8   Chloride 98 - 111 mmol/L 108  106  108   CO2 22 - 32 mmol/L 23  25  23    Calcium  8.9 - 10.3 mg/dL 9.0  9.0  8.7   Creatinine stable 10/16 Labs stable , no orthostatic hypotension  d/c IVF after current bag 11. Thrombocytopenia: resolved now in high normal range   12.  ABLA: Recheck stable no hematuria, melena noted, cont to monitor at least weekly - stable     Latest Ref Rng & Units 06/04/2024    5:52 AM 06/03/2024    7:34 PM 06/01/2024    5:27 AM  CBC  WBC 4.0 - 10.5 K/uL 5.9  7.1  9.6   Hemoglobin 13.0 - 17.0 g/dL 9.0  9.2  9.2   Hematocrit 39.0 - 52.0 % 27.9  29.6  28.8   Platelets 150 - 400 K/uL 186  196  209   Stable 10/16   - 13. Leucocytosis: resolved     14.  H/o dementia vascular: continue Namenda .  Has been seeing neuro as outpt   15.  Bilateral LE weakness proximally and Left distal LE weakness with hx Lumbar stenosis , suspect progression since prior Lumbar MRI in 2021, pt has had L3-4 fusion per Dr Beuford in 2021- some improvement in LE strength , pt feels decline in strength was post op after aneurysm repair, however pt has hx of dementia and is poor historian, had Hospital admit July 2025 and was reportedly using walker at baseline and went to SNF.  Using walker at OV with [facility] 04/08/2024 Pt had repeat MRI lumbar showing progressive L2-3 central stenosis , severe bilateral L4-5 foraminal stenosis, intrathecal T1 signal at L5-S1 Discussed case with Dr Beuford.  Will need f/u with Dr Beuford post discharge    LOS: 18 days A FACE TO FACE  EVALUATION WAS PERFORMED  [name] [name] 06/04/2024, 8:03 AM

## 2024-06-04 NOTE — Progress Notes (Signed)
 Occupational Therapy Session Note  Patient Details  Name: [name] [name] MRN: [id number] Date of Birth: 02-Dec-1945  Today's Date: 06/04/2024 OT Individual Time: [phone] OT Individual Time Calculation (min): 57 min    Short Term Goals: Week 3:  OT Short Term Goal 1 (Week 3): STGs=LTGs d/t ELOS  Skilled Therapeutic Interventions/Progress Updates:     Pt received semi-reclined in bed presenting to be in good spirits receptive to skilled OT session reporting 0/10 pain- OT offering intermittent rest breaks, repositioning, and therapeutic support to optimize participation in therapy session. Pt requesting shower this AM- focused session on ADL retraining and functional transfer training to decrease overall bourdon of care and increase independence in ADL.   Mobility:  -Bed Mobility: Transitioned to EOB with HOB elevated using bed rail with SUP. Improved initiation and decreased time required this trial.  -Stand Pivot: EOB > WC using RW light MIN A for power-up to standing and decent to wc. WC > elevated toilet seat using RW CGA +MIN verbal cues for sequencing.  -Ambulatory transfer: Pt completed ~62ft functional mobility toilet > TTB positioned in walk-in shower using RW with CGA +increased time d/t slowed cadence with MIN tactile cues provided for glute engagement and MIN verbal cues for proximity to RW and hand placement when sitting to TTB. Following shower, Pt completed functional mobility to wc ~10 ft at same level as previous.   ADLs:  -Toileting: Brief soiled upon arrival. Provided time on toilet to see if Pt needed additional void, however no void at this time. Brief donned max A d/t being soiled.  -Bathing: U/LB bathing completed seated on TTB utilizing long handled sponge to wash lower B LEs, back, and buttocks with MIN A. Pt completed single stand to RW during bathing tasks using grab bar MIN A while OT assisted with washing buttocks to ensure cleanliness.  -Dressing: U/LB dressing  completed seated in wc for safety and energy conservation. Donned [place] shirt and sweat shirt with set-up. Utilized Sports administrator when weaving B LEs into pants SUP with MIN verbal cues for positioning and problem solving. Brought pants over knees while seated and stood to RW with CGA to bring pants to waist. Assistance provided for pulling up brief, Pt then able to pull up pants with CGA for standing balance with assistance for tucking in shirt 2/2 fatigue.  -Grooming/hygiene: Pt completed oral care, groomed hair, and donned deodorant with set-up A/   Improved body awareness, activity tolerance, and safety utilizing RW noted this session. Pt was left resting in wc with call bell in reach, seat-belt alarm on, and all needs met.    Therapy Documentation Precautions:  Precautions Precautions: Fall Recall of Precautions/Restrictions: Impaired Precaution/Restrictions Comments: incision near L clavicle (axillary artery) and bil groins; L weakness Restrictions Weight Bearing Restrictions Per Provider Order: No   Therapy/Group: Individual Therapy  Katheryn SHAUNNA [place]ines 06/04/2024, 8:52 AM

## 2024-06-04 NOTE — Progress Notes (Signed)
 Speech Language Pathology Note  Patient Details  Name: [name] [name] MRN: [id number] Date of Birth: 1945-12-26 Today's Date: 06/04/2024  Speech-Language Pathologist participated in the interdisciplinary team conference, providing clinical information regarding the patient's current status, treatment goals, and weekly focus, including any barriers that need to be addressed. Please see the Inpatient Rehabilitation Team Conference and Plan of Care Update for further details.    [name] 06/04/2024, 2:54 PM

## 2024-06-04 NOTE — Group Note (Signed)
 Patient Details Name: [name] [name] Kost MRN: [id number] DOB: 1946/03/31 Today's Date: 06/04/2024  Time Calculation:   PT Group Time Calculation PT Group Start Time: 1330 PT Group Stop Time: 1430 PT Group Time Calculation (min): 60 min    Group Description: Dance Group: Pt participated in dance group with an emphasis on social interaction, motor planning, increasing overall activity tolerance and bimanual tasks. All songs were selected by group members. Dance moves included AROM of BUE/BLE gross motor movements with an emphasis on building functional endurance.   Individual level documentation: Patient completed group from sitting level. Patientt needed supervision to complete various dance moves with cues for sitting posture.  Patient needed min modifications during group.  Pain: Pain Assessment Pain Scale: 0-10 Pain Score: 0-No pain  Precautions:    Barbarita Hutmacher 06/04/2024, 3:07 PM

## 2024-06-04 NOTE — Progress Notes (Signed)
 Speech Language Pathology Daily Session Note  Patient Details  Name: [name] [name] MRN: [id number] Date of Birth: August 04, 1946  Today's Date: 06/04/2024 SLP Individual Time: 1030-1100 1450-1532 SLP Individual Time Calculation (min): 30 min 42 minutes   Short Term Goals: Week 3: SLP Short Term Goal 1 (Week 3): STGs=LTGs due to ELOS  Skilled Therapeutic Interventions: Session 1: Skilled therapy session focused on cognitive goals. SLP facilitated session by prompting patient to share information re: self, family and job. Patient recalled information with min-modA, requiring increased cues for thought processing and organization. Patient unable to recall activities completed in prior ST sessions, therefore SLP encouraged use and review of memory book. At the end of the session, SLP re-educated and provided handout re: memory strategies (WRAP: write, repeat, associate, picture). Patient left in chair with alarm set and call bell in reach. Continue POC.  Session 2: Skilled therapy session focused on cognitive goals. Upon entrance, patient completing bowel movement and transferring to WC. SLP transferred patient outside for session. Patient recalled 3/4 memory strategies with mod I and required re-education on remainder. SLP encouraged use of strategies during paragraph retention task. Patient recalled important information re: story after a 15 minute delay given minA - an improvement from prior. During delay, patient completed simple money management task. Patient counted change according to verbalized amount given modA. Patient left in chair with alarm set and call bell in reach. Continue POC.   Pain Session 1: none reported  Session 2: none reported   Therapy/Group: Individual Therapy  Tiffanye Hartmann M.A., CCC-SLP 06/04/2024, 7:41 AM

## 2024-06-05 LAB — GLUCOSE, CAPILLARY
Glucose-Capillary: 123 mg/dL — ABNORMAL HIGH (ref 70–99)
Glucose-Capillary: 135 mg/dL — ABNORMAL HIGH (ref 70–99)
Glucose-Capillary: 150 mg/dL — ABNORMAL HIGH (ref 70–99)
Glucose-Capillary: 127 mg/dL — ABNORMAL HIGH (ref 70–99)

## 2024-06-05 NOTE — Progress Notes (Signed)
 Occupational Therapy Session Note  Patient Details  Name: [name] [name] MRN: [id number] Date of Birth: 1946-03-19  Today's Date: 06/05/2024 OT Individual Time: 8694-8654 OT Individual Time Calculation (min): 40 min    Short Term Goals: Week 3:  OT Short Term Goal 1 (Week 3): STGs=LTGs d/t ELOS  Skilled Therapeutic Interventions/Progress Updates:    Pt received resting in [facility] with wife present. Pt presenting to be in good spirits receptive to skilled OT session reporting 0/10 pain- OT offering intermittent rest breaks, repositioning, and therapeutic support to optimize participation in therapy session. Upon OT arrival, Pt's wife was peresnt in the room for family education focused session. Provided education on CVA recovery, fall prevention, energy conservation techniques, ADLs and functional transfers to increase Pt safety upon d/c. Education provided on utilizing gait belt 24/7 upon d/c. OT recommending 24/7 supervision upon d/c, DME recommendations of [facility], and follow-up HHOT services with Pt and wife in agreement. Following demonstration, engaged Pt's wife in hands on functional transfers with education provided for proper technique, body mechanics, and simple cues to provide to increase Pt safety and prevent injury with close SUP from OT and feedback provided. Pt's wife demonstrates decreased insight into Pt deficits, for example, OT had to  step in to ensure Pt safety d/t not providing appropriate tactile and verbal cues after receiving education. Recommending continued family education over the weekend to ensure carry over of training to Pt safety. Informed PT of family education session, discussed continued carryover in next family session d/t decreased insight into Pt's deficits. Pt was left in [facility] and passed of to SLP.    Therapy Documentation Precautions:  Precautions Precautions: Fall Recall of Precautions/Restrictions: Impaired Precaution/Restrictions Comments: incision near L  clavicle (axillary artery) and bil groins; L weakness Restrictions Weight Bearing Restrictions Per Provider Order: No   Therapy/Group: Individual Therapy  [name] 06/05/2024, 1:56 PM

## 2024-06-05 NOTE — Progress Notes (Signed)
 Speech Language Pathology Daily Session Note  Patient Details  Name: [name] [name] MRN: [id number] Date of Birth: 1946-08-12  Today's Date: 06/05/2024 SLP Individual Time: 1345-1430 SLP Individual Time Calculation (min): 45 min  Short Term Goals: Week 3: SLP Short Term Goal 1 (Week 3): STGs=LTGs due to ELOS  Skilled Therapeutic Interventions: Skilled therapy session focused on family education and cognitive goals. SLP facilitated session by reviewing SLP POC with patients wife. SLP described activites targeted in ST as well as recommended continuation of cognitively enhancing and word finding strategies at d/c. Patient and family verbalized understanding with no further questions. SLP then targeted cognitive goals through medication management task. Patient was given written instructions to complete BID pill box. Patient completed task with supervisionA and no mistakes. SLP continued to challenge patient through identification of medication mistakes. Patient required minA to identify and correct mistakes. Patient left in [facility] with alarm set and call bell in reach. Continue POC  Pain denies  Therapy/Group: Individual Therapy  Lorik Guo M.A., CCC-SLP 06/05/2024, 7:41 AM

## 2024-06-05 NOTE — Progress Notes (Signed)
 PROGRESS NOTE   Subjective/Complaints:  Discussed low grade temp elevation , no c/o of URI or UTI symptoms Discusssed BP elevation   ROS-   Pt denies fever, SOB, chest pain, abd pain,  N/V/C/D, new motor or sensory changes   Objective:  MRI LUMBAR SPINE WITHOUT CONTRAST   TECHNIQUE: Multiplanar, multisequence MR imaging of the lumbar spine was performed. No intravenous contrast was administered.   COMPARISON:  MRI of the lumbar spine March 06, 2018 and January 23, 2013   FINDINGS: Segmentation: A transitional lumbosacral vertebra is assumed to represent the L5 level. Careful correlation with this numbering strategy prior to any procedural intervention would be recommended.   Alignment:  Physiologic.   Vertebrae: Interbody fusion and posterior left transpedicular fixation is noted at L3-4 with laminectomy at L4-5. Degenerative endplate changes are noted at L3-4. Marrow signal characteristics are otherwise maintained.   Conus medullaris and cauda equina: Conus extends to the T12-L1 level. Conus appear normal. Redundancy of nerve roots at the level of L1 is related to L2-3 spinal canal stenosis.   Paraspinal and other soft tissues: Well-defined oval lesion located to the left of midline in the prevertebral soft tissue at the level of L5-S1 is seen in the sagittal views. The lesion as predominantly hypointense signal on T1 and heterogeneous signal on T2. When compared to prior study, the lesion appears with similar size (3.7 x 2.7 cm) when compared to study performed 2019 but enlarged when compared to MRI performed 2014. T2 signal appear changed when compared to prior study.   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   Right renal cysts.   Disc levels:   T12-L1: No spinal canal or neural foraminal stenosis.   L1-2: No spinal canal or neural foraminal stenosis.   L2-3: Disc bulge  with superimposed tiny right central disc protrusion, facet degenerative changes and ligamentum flavum redundancy resulting in moderate to severe spinal canal stenosis, moderate right and severe left neural foraminal stenosis.   L3-4: Laminectomy with facet fusion. No spinal canal or neural foraminal stenosis.   L4-5: Disc bulge with tiny superimposed central disc protrusion and mild facet degenerative changes result in severe bilateral neural foraminal narrowing. Laminectomy is also noted with no spinal canal stenosis.   L5-S1: No spinal canal or neural foraminal stenosis.   Compared to prior study, there has been significant progression of the degree of spinal canal stenosis at L2-3.   IMPRESSION: 1. 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 This has progressed when compared to prior MRI. 2. Well-defined oval lesion the prevertebral soft tissues to the left of midline at the L5-S1 level, only seen on the sagittal images. This appear with similar size although slightly different T2 signal when compared to study performed 2019 but enlarged when compared to study performed in 2014. 3. 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 Recommend followup by ultrasound in 2 years. This recommendation follows ACR consensus guidelines: White Paper of the ACR Incidental Findings Committee II on Vascular Findings. J Am Coll  Radiol 2013; 10:     Electronically Signed   By: [name] M.D.   On: 10/22/2019 14:06 No results found.  Recent Labs    06/03/24 1934 06/04/24 0552  WBC 7.1 5.9  HGB 9.2* 9.0*  HCT 29.6* 27.9*  PLT 196 186     Recent Labs    06/04/24 0552  NA 141  K 3.8  CL 108  CO2 23  GLUCOSE 112*  BUN 18  CREATININE 1.46*  CALCIUM  9.0      Intake/Output Summary  (Last 24 hours) at 06/05/2024 0800 Last data filed at 06/04/2024 0806 Gross per 24 hour  Intake 240 ml  Output --  Net 240 ml        Physical Exam: Vital Signs Blood pressure (!) 158/108, pulse 88, temperature 99.4 F (37.4 C), temperature source Oral, resp. rate 20, height 5' 11 (1.803 m), weight 85 kg, SpO2 100%.   General: No acute distress laying in bed appears comfortable Mood and affect are appropriate Heart: Regular rate and rhythm no rubs murmurs or extra sounds Lungs: Clear to auscultation, breathing unlabored, no rales or wheezes Abdomen: Positive bowel sounds, soft nontender to palpation, nondistended No inguinal mass or lesions or tenderness on RIght side  Extremities: No clubbing, cyanosis, or edema Skin: No evidence of breakdown, no evidence of rash, IV infiltration with RUE forearm but not hand swelling , no erythema or tenderness  Neurologic: Cranial nerves II through XII intact, motor strength is 5/5 in bilateral deltoid, bicep, tricep, grip,3/5 R RIght and 3-/5 left  hip flexor, 4- Right and 3- left knee extensors, 5/5 RIght and 4/5 left ankle dorsiflexor and plantar flexor   Musculoskeletal: Full range of motion in all 4 extremities. No joint swelling Oriented to Gerrard and place but not time, delayed responses   Prior neuro assessment is c/w today's exam 06/05/2024.   Assessment/Plan: 1. Functional deficits which require 3+ hours per day of interdisciplinary therapy in a comprehensive inpatient rehab setting. Physiatrist is providing close team supervision and 24 hour management of active medical problems listed below. Physiatrist and rehab team continue to assess barriers to discharge/monitor patient progress toward functional and medical goals  Care Tool:  Bathing    Body parts bathed by patient: Right arm, Left arm, Chest, Front perineal area, Abdomen, Right upper leg, Left upper leg, Face, Right lower leg, Left lower leg   Body parts bathed by  helper: Buttocks     Bathing assist Assist Level: Minimal Assistance - Patient > 75%     Upper Body Dressing/Undressing Upper body dressing   What is the patient wearing?: Pull over shirt    Upper body assist Assist Level: Set up assist    Lower Body Dressing/Undressing Lower body dressing      What is the patient wearing?: Underwear/pull up, Pants     Lower body assist Assist for lower body dressing: Moderate Assistance - Patient 50 - 74%     Toileting Toileting    Toileting assist Assist for toileting: Maximal Assistance - Patient 25 - 49%     Transfers Chair/bed transfer  Transfers assist     Chair/bed transfer assist level: Minimal Assistance - Patient > 75%     Locomotion Ambulation   Ambulation assist   Ambulation activity did not occur: Safety/medical concerns  Assist level: Minimal Assistance - Patient > 75% Assistive device: Walker-rolling Max distance: 10   Walk 10 feet activity   Assist  Walk 10 feet activity  did not occur: Safety/medical concerns  Assist level: Minimal Assistance - Patient > 75% Assistive device: Walker-rolling   Walk 50 feet activity   Assist Walk 50 feet with 2 turns activity did not occur: Safety/medical concerns         Walk 150 feet activity   Assist Walk 150 feet activity did not occur: Safety/medical concerns         Walk 10 feet on uneven surface  activity   Assist Walk 10 feet on uneven surfaces activity did not occur: Safety/medical concerns         Wheelchair     Assist Is the patient using a wheelchair?: Yes Type of Wheelchair: Manual    Wheelchair assist level: Supervision/Verbal cueing Max wheelchair distance: 100'    Wheelchair 50 feet with 2 turns activity    Assist        Assist Level: Supervision/Verbal cueing   Wheelchair 150 feet activity     Assist      Assist Level: Minimal Assistance - Patient > 75%   Blood pressure (!) 158/108, pulse 88,  temperature 99.4 F (37.4 C), temperature source Oral, resp. rate 20, height 5' 11 (1.803 m), weight 85 kg, SpO2 100%.  Medical Problem List and Plan: 1. 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  Antithrombotics: -DVT/anticoagulation:  Pharmaceutical: Lovenox , age indeterminate asymptomatic Left Fem DVT, per Dr Sheree [facility], cont current meds, no activity restriction              -antiplatelet therapy: continue ASA   3. Pain Management: continue Oxycodone  prn for severe and tylenol  prn for mild pain   10/4-10/5- denies pain at all this AM- con't regimen 4. Mood/Behavior/Sleep: LCSW to follow for evaluation and support.              -antipsychotic agents: N/A   5. Neuropsych/cognition: This patient is not capable of making decisions on his own behalf. 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 Skin/Wound Care: Routine pressure relief measures. Monitor incision for healing Right antecubital IV infiltration    7. Fluids/Electrolytes/Nutrition:  Monitor I/O. Check CMET in am             --offer supplement prn   8. T2DM: Monitor BS ac/hs and use SSI for elevated BS             --Was on metform 500 mg bid--held due to dye study, consider resuming once kidney function improves   10/4- BG's running 100-118- con't to monitor  10-13 CBG is 108-127, fairly well-controlled continue to monitor  CBG (last 3)  Recent Labs    06/04/24 1620 06/04/24 2106 06/05/24 0552  GLUCAP 174* 138* 123*     9.HTN: Monitor BP TID--off home meds at this time. BP reviewed and is starting to increase,  Vitals:   06/04/24 2000 06/05/24 0400  BP: 135/76 (!) 158/108  Pulse: 78 88  Resp: 17 20  Temp: 98.2 F (36.8 C) 99.4 F (37.4 C)  SpO2: 100% 100%  Elevated this am   amlodipine  10mg  started ~1wk  ago  10. AKI: BUN/SCr 11/1.21 at admission with rise to 19/1.54 post procedure  Latest Ref Rng & Units 06/04/2024    5:52 AM 06/01/2024    5:27 AM 05/28/2024    5:22 AM  BMP  Glucose 70 - 99 mg/dL 887  868  888   BUN 8 - 23 mg/dL 18  13  23    Creatinine 0.61 - 1.24 mg/dL 8.53  8.59  8.29   Sodium 135 - 145 mmol/L 141  140  142   Potassium 3.5 - 5.1 mmol/L 3.8  4.6  3.8   Chloride 98 - 111 mmol/L 108  106  108   CO2 22 - 32 mmol/L 23  25  23    Calcium  8.9 - 10.3 mg/dL 9.0  9.0  8.7   Creatinine stable 10/16 Labs stable , no orthostatic hypotension  d/c IVF after current bag 11. Thrombocytopenia: resolved now in high normal range   12.  ABLA: Recheck stable no hematuria, melena noted, cont to monitor at least weekly - stable     Latest Ref Rng & Units 06/04/2024    5:52 AM 06/03/2024    7:34 PM 06/01/2024    5:27 AM  CBC  WBC 4.0 - 10.5 K/uL 5.9  7.1  9.6   Hemoglobin 13.0 - 17.0 g/dL 9.0  9.2  9.2   Hematocrit 39.0 - 52.0 % 27.9  29.6  28.8   Platelets 150 - 400 K/uL 186  196  209   Stable 10/16   - 13. Leucocytosis: resolved     14.  H/o dementia vascular: continue Namenda .  Has been seeing neuro as outpt   15.  Bilateral LE weakness proximally and Left distal LE weakness with hx Lumbar stenosis , suspect progression since prior Lumbar MRI in 2021, pt has had L3-4 fusion per Dr Beuford in 2021- some improvement in LE strength , pt feels decline in strength was post op after aneurysm repair, however pt has hx of dementia and is poor historian, had Hospital admit July 2025 and was reportedly using walker at baseline and went to SNF.  Using walker at OV with [facility] 04/08/2024 Pt had repeat MRI lumbar showing progressive L2-3 central stenosis , severe bilateral L4-5 foraminal stenosis, intrathecal T1 signal at L5-S1 Discussed case with Dr Beuford.  Will need f/u with Dr Beuford post discharge    LOS: 19 days A FACE TO FACE EVALUATION WAS PERFORMED  [name] [name] 06/05/2024, 8:00 AM

## 2024-06-05 NOTE — Plan of Care (Signed)
  Problem: Consults Goal: RH GENERAL PATIENT EDUCATION Description: See Patient Education module for education specifics. Outcome: Progressing   Problem: RH SAFETY Goal: RH STG ADHERE TO SAFETY PRECAUTIONS W/ASSISTANCE/DEVICE Description: STG Adhere to Safety Precautions With cues Assistance/Device. Outcome: Progressing

## 2024-06-05 NOTE — Plan of Care (Signed)
  Problem: Consults Goal: RH GENERAL PATIENT EDUCATION Description: See Patient Education module for education specifics. 06/05/2024 1900 by Colon Olam LABOR, LPN Outcome: Progressing 06/05/2024 1709 by Colon Olam LABOR, LPN Outcome: Progressing   Problem: RH SAFETY Goal: RH STG ADHERE TO SAFETY PRECAUTIONS W/ASSISTANCE/DEVICE Description: STG Adhere to Safety Precautions With cues  Assistance/Device. Outcome: Progressing

## 2024-06-05 NOTE — Progress Notes (Signed)
 Occupational Therapy Session Note  Patient Details  Name: [name] [name] MRN: [id number] Date of Birth: 1945/12/31  Today's Date: 06/05/2024 OT Individual Time: 0932-1026 OT Individual Time Calculation (min): 54 min    Short Term Goals: Week 3:  OT Short Term Goal 1 (Week 3): STGs=LTGs d/t ELOS  Skilled Therapeutic Interventions/Progress Updates:     Pt received semi-reclined in bed presenting to be in good spirits receptive to skilled OT session reporting 0/10 pain- OT offering intermittent rest breaks, repositioning, and therapeutic support to optimize participation in therapy session. Pt politely declining need for shower or toileting this AM, receptive to completing morning ADL routine. Focused this session on ADL retraining and functional transfer training to increase overall independence and decrease bourdon of care. He transitioned to EOB with HOB elevated SUP +increased time. Sat EOB for U/LB dressing tasks donning OH shirt with SUP, verbal cues for trunk positioning and increased time d/t slowed initiation +CGA-MIN A provided for trunk stability. He was able to weave feet into pant without use of reacher, however required use of bed rail for increased stability d/t dynamic sitting balance deficit and verbal cues for positioning. Decreased trunk control and dynamic sitting balance noted this session compared to previous. When standing to pull up brief/pants, Pt with urinary incontinence episode 2/2 to urgency and pants/brief were soiled as a result. Noted to have very dark urine with education provided on importance of fluid intake. Doffed dirty brief/pants total A to avoid mess and to support Pt moral. Engaged Pt in LB bathing EOB with Pt able to complete MIN A, assistance required when standing to RW for posterior peri-care- remanence of BM noted with MAX A provided to ensure cleanliness. Pt then able to donn new brief and pants with MIN A to weave feet into pants 2/2 to fatigue and to fully  bring pants to waist. Stand pivot EOB > wc using RW MIN A to power up to standing position and MOD A required for balance and RW management skills- heavier posterior lean and decreased problem solving noted this trial- MAX verbal cues for sequencing, trunk alignment, RW position, and technique. Poor awareness of body positioning during transfer and decreased insight into deficits overall. Grooming/hygiene tasks of oral care, grooming hair, and washing face completed in seated position for safety and energy conservation with set-up A. Assessed BP in seated position d/t concern for low BP 2/2 to dehydration: BP 142/77(97) HR 79. Finished session by engaging Pt in completing memory noted book activity- Pt able to recall events of OT session with 50% accuracy, +increased time and MOD questioning cues. Pt was left resting in wc with call bell in reach, seatbelt alarm on, and all needs met.    Therapy Documentation Precautions:  Precautions Precautions: Fall Recall of Precautions/Restrictions: Impaired Precaution/Restrictions Comments: incision near L clavicle (axillary artery) and bil groins; L weakness Restrictions Weight Bearing Restrictions Per Provider Order: No  Therapy/Group: Individual Therapy  Katheryn SHAUNNA [place] 06/05/2024, 9:40 AM

## 2024-06-06 LAB — GLUCOSE, CAPILLARY
Glucose-Capillary: 107 mg/dL — ABNORMAL HIGH (ref 70–99)
Glucose-Capillary: 147 mg/dL — ABNORMAL HIGH (ref 70–99)
Glucose-Capillary: 126 mg/dL — ABNORMAL HIGH (ref 70–99)
Glucose-Capillary: 162 mg/dL — ABNORMAL HIGH (ref 70–99)

## 2024-06-06 NOTE — Progress Notes (Signed)
 PROGRESS NOTE   Subjective/Complaints:  Pt slept well. Denies pain. No questions  ROS: Patient denies fever, rash, sore throat, blurred vision, dizziness, nausea, vomiting, diarrhea, cough, shortness of breath or chest pain, joint or back/neck pain, headache, or mood change.    Objective:  MRI LUMBAR SPINE WITHOUT CONTRAST   TECHNIQUE: Multiplanar, multisequence MR imaging of the lumbar spine was performed. No intravenous contrast was administered.   COMPARISON:  MRI of the lumbar spine March 06, 2018 and January 23, 2013   FINDINGS: Segmentation: A transitional lumbosacral vertebra is assumed to represent the L5 level. Careful correlation with this numbering strategy prior to any procedural intervention would be recommended.   Alignment:  Physiologic.   Vertebrae: Interbody fusion and posterior left transpedicular fixation is noted at L3-4 with laminectomy at L4-5. Degenerative endplate changes are noted at L3-4. Marrow signal characteristics are otherwise maintained.   Conus medullaris and cauda equina: Conus extends to the T12-L1 level. Conus appear normal. Redundancy of nerve roots at the level of L1 is related to L2-3 spinal canal stenosis.   Paraspinal and other soft tissues: Well-defined oval lesion located to the left of midline in the prevertebral soft tissue at the level of L5-S1 is seen in the sagittal views. The lesion as predominantly hypointense signal on T1 and heterogeneous signal on T2. When compared to prior study, the lesion appears with similar size (3.7 x 2.7 cm) when compared to study performed 2019 but enlarged when compared to MRI performed 2014. T2 signal appear changed when compared to prior study.   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   Right renal cysts.   Disc levels:   T12-L1: No spinal canal or neural foraminal stenosis.   L1-2: No spinal canal or  neural foraminal stenosis.   L2-3: Disc bulge with superimposed tiny right central disc protrusion, facet degenerative changes and ligamentum flavum redundancy resulting in moderate to severe spinal canal stenosis, moderate right and severe left neural foraminal stenosis.   L3-4: Laminectomy with facet fusion. No spinal canal or neural foraminal stenosis.   L4-5: Disc bulge with tiny superimposed central disc protrusion and mild facet degenerative changes result in severe bilateral neural foraminal narrowing. Laminectomy is also noted with no spinal canal stenosis.   L5-S1: No spinal canal or neural foraminal stenosis.   Compared to prior study, there has been significant progression of the degree of spinal canal stenosis at L2-3.   IMPRESSION: 1. 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 This has progressed when compared to prior MRI. 2. Well-defined oval lesion the prevertebral soft tissues to the left of midline at the L5-S1 level, only seen on the sagittal images. This appear with similar size although slightly different T2 signal when compared to study performed 2019 but enlarged when compared to study performed in 2014. 3. 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 Recommend followup by ultrasound in 2 years. This recommendation follows ACR consensus guidelines: White Paper of the ACR Incidental Findings Committee II on Vascular Findings. J Am  Coll Radiol 2013; 10:     Electronically Signed   By: [name] M.D.   On: 10/22/2019 14:06 No results found.  Recent Labs    06/03/24 1934 06/04/24 0552  WBC 7.1 5.9  HGB 9.2* 9.0*  HCT 29.6* 27.9*  PLT 196 186     Recent Labs    06/04/24 0552  NA 141  K 3.8  CL 108  CO2 23  GLUCOSE 112*  BUN 18  CREATININE  1.46*  CALCIUM  9.0      Intake/Output Summary (Last 24 hours) at 06/06/2024 1009 Last data filed at 06/06/2024 0829 Gross per 24 hour  Intake 470 ml  Output --  Net 470 ml        Physical Exam: Vital Signs Blood pressure (!) 140/82, pulse 70, temperature 98.2 F (36.8 C), resp. rate 18, height 5' 11 (1.803 m), weight 85 kg, SpO2 100%.   Constitutional: No distress . Vital signs reviewed. HEENT: NCAT, EOMI, oral membranes moist Neck: supple Cardiovascular: RRR without murmur. No JVD    Respiratory/Chest: CTA Bilaterally without wheezes or rales. Normal effort    GI/Abdomen: BS +, non-tender, non-distended Ext: no clubbing, cyanosis, or edema Psych: pleasant and cooperative  Skin: No evidence of breakdown, no evidence of rash, IV infiltration with RUE forearm but not hand swelling , no erythema or tenderness  Neurologic: Cranial nerves II through XII intact, motor strength is 5/5 in bilateral deltoid, bicep, tricep, grip,3/5 R RIght and 3-/5 left  hip flexor, 4- Right and 3- left knee extensors, 5/5 RIght and 4/5 left ankle dorsiflexor and plantar flexor   Musculoskeletal: Full range of motion in all 4 extremities. No joint swelling Oriented to [place] and place but not time, delayed responses   Prior neuro assessment is c/w today's exam 06/06/2024.   Assessment/Plan: 1. Functional deficits which require 3+ hours per day of interdisciplinary therapy in a comprehensive inpatient rehab setting. Physiatrist is providing close team supervision and 24 hour management of active medical problems listed below. Physiatrist and rehab team continue to assess barriers to discharge/monitor patient progress toward functional and medical goals  Care Tool:  Bathing    Body parts bathed by patient: Right arm, Left arm, Chest, Front perineal area, Abdomen, Right upper leg, Left upper leg, Face, Right lower leg, Left lower leg   Body parts bathed by helper: Buttocks     Bathing  assist Assist Level: Minimal Assistance - Patient > 75%     Upper Body Dressing/Undressing Upper body dressing   What is the patient wearing?: Pull over shirt    Upper body assist Assist Level: Set up assist    Lower Body Dressing/Undressing Lower body dressing      What is the patient wearing?: Underwear/pull up, Pants     Lower body assist Assist for lower body dressing: Moderate Assistance - Patient 50 - 74%     Toileting Toileting    Toileting assist Assist for toileting: Maximal Assistance - Patient 25 - 49%     Transfers Chair/bed transfer  Transfers assist     Chair/bed transfer assist level: Minimal Assistance - Patient > 75%     Locomotion Ambulation   Ambulation assist   Ambulation activity did not occur: Safety/medical concerns  Assist level: Minimal Assistance - Patient > 75% Assistive device: Walker-rolling Max distance: 10   Walk 10 feet activity   Assist  Walk 10 feet activity did not occur: Safety/medical concerns  Assist level: Minimal Assistance -  Patient > 75% Assistive device: Walker-rolling   Walk 50 feet activity   Assist Walk 50 feet with 2 turns activity did not occur: Safety/medical concerns         Walk 150 feet activity   Assist Walk 150 feet activity did not occur: Safety/medical concerns         Walk 10 feet on uneven surface  activity   Assist Walk 10 feet on uneven surfaces activity did not occur: Safety/medical concerns         Wheelchair     Assist Is the patient using a wheelchair?: Yes Type of Wheelchair: Manual    Wheelchair assist level: Supervision/Verbal cueing Max wheelchair distance: 100'    Wheelchair 50 feet with 2 turns activity    Assist        Assist Level: Supervision/Verbal cueing   Wheelchair 150 feet activity     Assist      Assist Level: Minimal Assistance - Patient > 75%   Blood pressure (!) 140/82, pulse 70, temperature 98.2 F (36.8 C), resp.  rate 18, height 5' 11 (1.803 m), weight 85 kg, SpO2 100%.  Medical Problem List and Plan: 1. 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  Antithrombotics: -DVT/anticoagulation:  Pharmaceutical: Lovenox , age indeterminate asymptomatic Left Fem DVT, per Dr Sheree VVS, cont current meds, no activity restriction              -antiplatelet therapy: continue ASA   3. Pain Management: continue Oxycodone  prn for severe and tylenol  prn for mild pain   10/4-10/5- denies pain at all this AM- con't regimen 4. Mood/Behavior/Sleep: LCSW to follow for evaluation and support.              -antipsychotic agents: N/A   5. Neuropsych/cognition: This patient is not capable of making decisions on his own behalf. 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 Skin/Wound Care: Routine pressure relief measures. Monitor incision for healing Right antecubital IV infiltration    7. Fluids/Electrolytes/Nutrition:  Monitor I/O. Check CMET in am             --offer supplement prn   8. T2DM: Monitor BS ac/hs and use SSI for elevated BS             --Was on metform 500 mg bid--held due to dye study, consider resuming once kidney function improves   10/4- BG's running 100-118- con't to monitor  10-13 CBG is 108-127, fairly well-controlled continue to monitor  10/18 reasonable control CBG (last 3)  Recent Labs    06/05/24 1620 06/05/24 2156 06/06/24 0623  GLUCAP 150* 127* 107*     9.HTN: Monitor BP TID--off home meds at this time. BP reviewed and is starting to increase,  Vitals:   06/05/24 2004 06/06/24 0513  BP: 130/80 (!) 140/82  Pulse: 78 70  Resp: 18 18  Temp: 97.9 F (36.6 C) 98.2 F (36.8 C)  SpO2: 99% 100%      amlodipine  10mg  started ~1wk ago 10/18  controlled  10. AKI: BUN/SCr 11/1.21 at admission with rise to 19/1.54 post procedure  Latest Ref Rng & Units 06/04/2024    5:52 AM 06/01/2024    5:27 AM 05/28/2024    5:22 AM  BMP  Glucose 70 - 99 mg/dL 887  868  888   BUN 8 - 23 mg/dL 18  13  23    Creatinine 0.61 - 1.24 mg/dL 8.53  8.59  8.29   Sodium 135 - 145 mmol/L 141  140  142   Potassium 3.5 - 5.1 mmol/L 3.8  4.6  3.8   Chloride 98 - 111 mmol/L 108  106  108   CO2 22 - 32 mmol/L 23  25  23    Calcium  8.9 - 10.3 mg/dL 9.0  9.0  8.7   Creatinine stable 10/16   11. Thrombocytopenia: resolved now in high normal range   12.  ABLA: Recheck stable no hematuria, melena noted, cont to monitor at least weekly - stable     Latest Ref Rng & Units 06/04/2024    5:52 AM 06/03/2024    7:34 PM 06/01/2024    5:27 AM  CBC  WBC 4.0 - 10.5 K/uL 5.9  7.1  9.6   Hemoglobin 13.0 - 17.0 g/dL 9.0  9.2  9.2   Hematocrit 39.0 - 52.0 % 27.9  29.6  28.8   Platelets 150 - 400 K/uL 186  196  209   Stable 10/16   - 13. Leucocytosis: resolved     14.  H/o dementia vascular: continue Namenda .  Has been seeing neuro as outpt   15.  Bilateral LE weakness proximally and Left distal LE weakness with hx Lumbar stenosis , suspect progression since prior Lumbar MRI in 2021, pt has had L3-4 fusion per Dr Beuford in 2021- some improvement in LE strength , pt feels decline in strength was post op after aneurysm repair, however pt has hx of dementia and is poor historian, had Hospital admit July 2025 and was reportedly using walker at baseline and went to SNF.  Using walker at OV with [facility] 04/08/2024 Pt had repeat MRI lumbar showing progressive L2-3 central stenosis , severe bilateral L4-5 foraminal stenosis, intrathecal T1 signal at L5-S1 Discussed case with Dr Beuford.  Will need f/u with Dr Beuford post discharge    LOS: 20 days A FACE TO FACE EVALUATION WAS PERFORMED  [name] [name] 06/06/2024, 10:09 AM

## 2024-06-06 NOTE — Plan of Care (Signed)
  Problem: Consults Goal: [facility] GENERAL PATIENT EDUCATION Description: See Patient Education module for education specifics. Outcome: Progressing   

## 2024-06-06 NOTE — Plan of Care (Signed)
  Problem: Consults Goal: RH GENERAL PATIENT EDUCATION Description: See Patient Education module for education specifics. Outcome: Progressing   Problem: RH BOWEL ELIMINATION Goal: RH STG MANAGE BOWEL WITH ASSISTANCE Description: STG Manage Bowel with mod I Assistance. Outcome: Progressing Goal: RH STG MANAGE BOWEL W/MEDICATION W/ASSISTANCE Description: STG Manage Bowel with Medication with mod I Assistance. Outcome: Progressing   Problem: RH BLADDER ELIMINATION Goal: RH STG MANAGE BLADDER WITH ASSISTANCE Description: STG Manage Bladder With mod I Assistance Outcome: Progressing Goal: RH STG MANAGE BLADDER WITH MEDICATION WITH ASSISTANCE Description: STG Manage Bladder With Medication With Assistance. Outcome: Progressing   Problem: RH SAFETY Goal: RH STG ADHERE TO SAFETY PRECAUTIONS W/ASSISTANCE/DEVICE Description: STG Adhere to Safety Precautions With cues  Assistance/Device. Outcome: Progressing   Problem: RH PAIN MANAGEMENT Goal: RH STG PAIN MANAGED AT OR BELOW PT'S PAIN GOAL Description: Pain < 4 with prns Outcome: Progressing   Problem: RH KNOWLEDGE DEFICIT GENERAL Goal: RH STG INCREASE KNOWLEDGE OF SELF CARE AFTER HOSPITALIZATION Description: Patient and spouse will be able to manage care using educational resources for medications, diet independently Outcome: Progressing   Problem: Education: Goal: Ability to describe self-care measures that may prevent or decrease complications (Diabetes Survival Skills Education) will improve Outcome: Progressing Goal: Individualized Educational Video(s) Outcome: Progressing   Problem: Coping: Goal: Ability to adjust to condition or change in health will improve Outcome: Progressing   Problem: Fluid Volume: Goal: Ability to maintain a balanced intake and output will improve Outcome: Progressing   Problem: Health Behavior/Discharge Planning: Goal: Ability to identify and utilize available resources and services will  improve Outcome: Progressing Goal: Ability to manage health-related needs will improve Outcome: Progressing   Problem: Metabolic: Goal: Ability to maintain appropriate glucose levels will improve Outcome: Progressing   Problem: Nutritional: Goal: Maintenance of adequate nutrition will improve Outcome: Progressing Goal: Progress toward achieving an optimal weight will improve Outcome: Progressing   Problem: Skin Integrity: Goal: Risk for impaired skin integrity will decrease Outcome: Progressing   Problem: Tissue Perfusion: Goal: Adequacy of tissue perfusion will improve Outcome: Progressing   Problem: RH Vision Goal: RH LTG Vision (Specify) Outcome: Progressing

## 2024-06-06 NOTE — Progress Notes (Signed)
 Physical Therapy Session Note  Patient Details  Name: [name] [name] MRN: [id number] Date of Birth: March 16, 1946  Today's Date: 06/05/2024 PT Individual Time: 8557-8460 PT Individual Time Calculation (min): 57 min  Short Term Goals: Week 2:  PT Short Term Goal 1 (Week 2): Pt will be able to perform bed mobility with consistent MinA PT Short Term Goal 1 - Progress (Week 2): Met PT Short Term Goal 2 (Week 2): Pt will be able to perform transfers bed <> w/c with consistent Min A PT Short Term Goal 2 - Progress (Week 2): Met PT Short Term Goal 3 (Week 2): Pt will demonstrate dynamic sitting balance with CGA PT Short Term Goal 3 - Progress (Week 2): Met PT Short Term Goal 4 (Week 2): Pt will ambulate >50 ft consistently with CGA/ MinA. PT Short Term Goal 4 - Progress (Week 2): Met Week 3:  PT Short Term Goal 1 (Week 3): STG = LTG d/t ELOS  Skilled Therapeutic Interventions/Progress Updates:  Patient seated upright in w/c on entrance to room. Patient alert and agreeable to PT session. Wife, [name], present for family education.   Patient with no pain complaint at start of session.  Therapeutic Activity: Bed Mobility: Pt performed supine <> sit with ***. VC/ tc required for ***. Transfers: Pt performed sit<>stand and stand pivot transfers throughout session with ***. Provided vc/ tc for***.  Gait Training:  Pt ambulated *** ft using *** with ***. Demonstrated ***. Provided vc/ tc for ***.  Wheelchair Mobility:  Pt propelled wheelchair *** feet with ***. Provided vc/ tc for ***.  Neuromuscular Re-ed: NMR facilitated during session with focus on ***. Pt guided in ***. NMR performed for improvements in motor control and coordination, balance, sequencing, judgement, and self confidence/ efficacy in performing all aspects of mobility at highest level of independence.   Therapeutic Exercise: Pt performed the following exercises with vc/ tc for proper technique. ***  Patient *** at end of  session with brakes locked, *** alarm set, and all needs within reach.   Therapy Documentation Precautions:  Precautions Precautions: Fall Recall of Precautions/Restrictions: Impaired Precaution/Restrictions Comments: incision near L clavicle (axillary artery) and bil groins; L weakness Restrictions Weight Bearing Restrictions Per Provider Order: No General:   Vital Signs: Therapy Vitals Temp: 98.2 F (36.8 C) Pulse Rate: 70 Resp: 18 BP: (!) 140/82 Patient Position (if appropriate): Lying Oxygen Therapy SpO2: 100 % O2 Device: Room Air Pain:   Mobility:   Locomotion :    Trunk/Postural Assessment :    Balance:   Exercises:   Other Treatments:      Therapy/Group: Individual Therapy  [name] PT, DPT, CSRS 06/05/2024, 6:21 PM

## 2024-06-07 LAB — GLUCOSE, CAPILLARY
Glucose-Capillary: 120 mg/dL — ABNORMAL HIGH (ref 70–99)
Glucose-Capillary: 100 mg/dL — ABNORMAL HIGH (ref 70–99)
Glucose-Capillary: 184 mg/dL — ABNORMAL HIGH (ref 70–99)
Glucose-Capillary: 114 mg/dL — ABNORMAL HIGH (ref 70–99)

## 2024-06-07 NOTE — Plan of Care (Signed)
  Problem: Consults Goal: RH GENERAL PATIENT EDUCATION Description: See Patient Education module for education specifics. Outcome: Progressing   Problem: RH SAFETY Goal: RH STG ADHERE TO SAFETY PRECAUTIONS W/ASSISTANCE/DEVICE Description: STG Adhere to Safety Precautions With cues Assistance/Device. Outcome: Progressing

## 2024-06-08 ENCOUNTER — Other Ambulatory Visit (HOSPITAL_COMMUNITY): Payer: Self-pay

## 2024-06-08 LAB — CBC
HCT: 31.2 % — ABNORMAL LOW (ref 39.0–52.0)
Hemoglobin: 9.9 g/dL — ABNORMAL LOW (ref 13.0–17.0)
MCH: 28.2 pg (ref 26.0–34.0)
MCHC: 31.7 g/dL (ref 30.0–36.0)
MCV: 88.9 fL (ref 80.0–100.0)
Platelets: 194 K/uL (ref 150–400)
RBC: 3.51 MIL/uL — ABNORMAL LOW (ref 4.22–5.81)
RDW: 15 % (ref 11.5–15.5)
WBC: 7.2 K/uL (ref 4.0–10.5)
nRBC: 0 % (ref 0.0–0.2)

## 2024-06-08 LAB — BASIC METABOLIC PANEL WITH GFR
Anion gap: 8 (ref 5–15)
BUN: 21 mg/dL (ref 8–23)
CO2: 26 mmol/L (ref 22–32)
Calcium: 9.2 mg/dL (ref 8.9–10.3)
Chloride: 105 mmol/L (ref 98–111)
Creatinine, Ser: 1.59 mg/dL — ABNORMAL HIGH (ref 0.61–1.24)
GFR, Estimated: 44 mL/min — ABNORMAL LOW (ref >60)
Glucose, Bld: 116 mg/dL — ABNORMAL HIGH (ref 70–99)
Potassium: 4.6 mmol/L (ref 3.5–5.1)
Sodium: 139 mmol/L (ref 135–145)

## 2024-06-08 LAB — URINALYSIS, ROUTINE W REFLEX MICROSCOPIC
Bilirubin Urine: NEGATIVE
Glucose, UA: NEGATIVE mg/dL
Ketones, ur: NEGATIVE mg/dL
Nitrite: NEGATIVE
Protein, ur: 30 mg/dL — AB
Specific Gravity, Urine: 1.008 (ref 1.005–1.030)
WBC, UA: >50 WBC/hpf (ref 0–5)
pH: 6 (ref 5.0–8.0)

## 2024-06-08 LAB — GLUCOSE, CAPILLARY
Glucose-Capillary: 115 mg/dL — ABNORMAL HIGH (ref 70–99)
Glucose-Capillary: 131 mg/dL — ABNORMAL HIGH (ref 70–99)
Glucose-Capillary: 158 mg/dL — ABNORMAL HIGH (ref 70–99)
Glucose-Capillary: 174 mg/dL — ABNORMAL HIGH (ref 70–99)

## 2024-06-08 MED ORDER — ASPIRIN EC 81 MG PO TBEC
81.0000 mg | DELAYED_RELEASE_TABLET | Freq: Every day | ORAL | 0 refills | Status: AC
  Filled 2024-06-08: qty 100, 100d supply, fill #0

## 2024-06-08 MED ORDER — ROSUVASTATIN CALCIUM 10 MG PO TABS
10.0000 mg | ORAL_TABLET | Freq: Every day | ORAL | 0 refills | Status: AC
  Filled 2024-06-08: qty 30, 30d supply, fill #0

## 2024-06-08 MED ORDER — TAMSULOSIN HCL 0.4 MG PO CAPS
0.4000 mg | ORAL_CAPSULE | Freq: Every day | ORAL | 0 refills | Status: AC
  Filled 2024-06-08: qty 30, 30d supply, fill #0

## 2024-06-08 MED ORDER — AMLODIPINE BESYLATE 10 MG PO TABS
10.0000 mg | ORAL_TABLET | Freq: Every day | ORAL | 0 refills | Status: AC
  Filled 2024-06-08: qty 30, 30d supply, fill #0

## 2024-06-08 MED ORDER — PANTOPRAZOLE SODIUM 40 MG PO TBEC
40.0000 mg | DELAYED_RELEASE_TABLET | Freq: Every day | ORAL | 0 refills | Status: AC
  Filled 2024-06-08: qty 30, 30d supply, fill #0

## 2024-06-08 MED ORDER — POLYETHYLENE GLYCOL 3350 17 GM/SCOOP PO POWD
34.0000 g | Freq: Every day | ORAL | 0 refills | Status: AC
  Filled 2024-06-08: qty 238, 7d supply, fill #0

## 2024-06-08 MED ORDER — MELATONIN 5 MG PO TABS
5.0000 mg | ORAL_TABLET | Freq: Every evening | ORAL | 0 refills | Status: AC | PRN
  Filled 2024-06-08: qty 30, 30d supply, fill #0

## 2024-06-08 NOTE — Progress Notes (Signed)
 Occupational Therapy Discharge Summary  Patient Details  Name: [name] [name] MRN: [id number] Date of Birth: 1946/02/23  Date of Discharge from OT service:June 08, 2024  Today's Date: 06/08/2024 OT Individual Time: 8652-8554 OT Individual Time Calculation (min): 58 min    Patient has met 9 of 10 long term goals due to improved activity tolerance, improved balance, postural control, ability to compensate for deficits, functional use of  LEFT upper and LEFT lower extremity, improved attention, improved awareness, and improved coordination.  Patient to discharge at [facility] Assist level.  Patient's care partner is independent to provide the necessary physical and cognitive assistance at discharge. Pt is d/cing home with wife with MIN A required for LB dressing, toileting, and bathing, Setup A for UB dressing, SUP for UB bathing, and MOD I for feeding and grooming tasks.    Reasons goals not met: Pt did not meet bathing goal d/t requiring MIN A to wash bottom for thoroughness, however improved from initial evaluation.   Recommendation:  Patient will benefit from ongoing skilled OT services in home health setting to continue to advance functional skills in the area of BADL and Reduce care partner burden.  Equipment: No equipment provided  Reasons for discharge: treatment goals met and discharge from hospital  Patient/family agrees with progress made and goals achieved: Yes  OT Discharge Skilled Therapeutic Intervention: Pt received resting in [facility] with family present presenting to be in good spirits receptive to skilled OT session reporting 0/10 pain- OT offering intermittent rest breaks, repositioning, and therapeutic support to optimize participation in therapy session. Focused this session on completing d/c assessments and HEP. Transported Pt to rehab gym via WC. Engaged Pt in Box and Blocks assessment with completing 32 on R and L, improved from initial attempt. Provided Pt with yellow  resistance band and HEP for UE exercises listed below that were demonstrated and completed.  Exercises - Seated Shoulder Horizontal Abduction with Resistance - 1 x daily - 7 x weekly - 2 sets - 10 reps - Seated Bilateral Shoulder External Rotation with Resistance - 1 x daily - 7 x weekly - 2 sets - 10 reps - Seated Elbow Extension with Self-Anchored Resistance - 1 x daily - 7 x weekly - 2 sets - 10 reps - Seated Elbow Flexion with Self-Anchored Resistance - 1 x daily - 7 x weekly - 2 sets - 10 reps - Seated Scapular Protraction with Resistance - 1 x daily - 7 x weekly - 2 sets - 10 reps - Single Arm Shoulder Press with Resistance Band - 1 x daily - 7 x weekly - 2 sets - 10 reps - Seated Shoulder Flexion with Self-Anchored Resistance - 1 x daily - 7 x weekly - 2 sets - 10 reps - Seated Shoulder Diagonal Pulls with Resistance - 1 x daily - 7 x weekly - 2 sets - 10 reps   Transported Pt back to room via WC d/t time management. Pt was left resting in [facility] with call bell in reach, family present, and all needs met.   Precautions/Restrictions  Precautions Precautions: Fall Recall of Precautions/Restrictions: Impaired Precaution/Restrictions Comments: LLE weakness Restrictions Weight Bearing Restrictions Per Provider Order: No Pain Pain Assessment Pain Scale: 0-10 Pain Score: 0-No pain ADL ADL Equipment Provided: Reacher Eating: Modified independent Where Assessed-Eating: Wheelchair Grooming: Modified independent Where Assessed-Grooming: Sitting at sink Upper Body Bathing: Supervision/safety Where Assessed-Upper Body Bathing: Shower (seated on TTB) Lower Body Bathing: Minimal assistance Where Assessed-Lower Body Bathing: Shower (seated on  TTB) Upper Body Dressing: Setup Where Assessed-Upper Body Dressing: Wheelchair Lower Body Dressing: Minimal assistance (using reacher) Where Assessed-Lower Body Dressing: Wheelchair Toileting: Minimal assistance Where Assessed-Toileting: Toilet  (elevated toilet seat) Toilet Transfer: Furniture conservator/restorer Method: Stand pivot Acupuncturist: Grab bars, Raised toilet seat Tub/Shower Transfer: Minimal assistance Tub/Shower Transfer Method: Stand pivot (RW) Tub/Shower Equipment: Insurance underwriter: Minimal assistance Film/video editor Method: Ambulating (RW) Astronomer: Emergency planning/management officer ADL Comments: Not safe for shower stall transfer, Patient with significant posterior lean and motor planning deficits. Vision Baseline Vision/History: 1 Wears glasses (bifocals) Patient Visual Report: No change from baseline (Pt planning to follow up with opthamologist after d/c) Vision Assessment?: Yes Eye Alignment: Within Functional Limits Ocular Range of Motion: Within Functional Limits Alignment/Gaze Preference: Within Defined Limits Tracking/Visual Pursuits: Able to track stimulus in all quads without difficulty Saccades: Within functional limits (slow, however WFL) Convergence: Impaired (comment) (L eye did not converge during testing) Visual Fields: No apparent deficits Additional Comments: wears glasses for reading and distance Perception  Perception: Within Functional Limits Praxis Praxis: Impaired Praxis Impairment Details: Motor planning (mild impairment , however improved from initial eval) Cognition Cognition Overall Cognitive Status: Impaired/Different from baseline (some cognitive deficits present at baseline) Arousal/Alertness: Awake/alert Orientation Level: [place];Place;Situation [place]: Oriented Place: Oriented Situation: Oriented Memory: Impaired Memory Impairment: Retrieval deficit;Decreased short term memory Decreased Short Term Memory: Verbal basic;Functional basic Attention: Focused;Sustained Focused Attention: Appears intact Sustained Attention: Appears intact Awareness: Impaired Awareness Impairment: Intellectual impairment (intellectual  impairment present during ADLs and transfers) Problem Solving: Impaired Problem Solving Impairment: Verbal basic;Functional basic Executive Function: Initiating Reasoning: Impaired Initiating: Impaired Self Monitoring: Impaired Self Correcting: Impaired Safety/Judgment: Impaired Brief Interview for Mental Status (BIMS) Repetition of Three Words (First Attempt): 3 Temporal Orientation: Year: Correct Temporal Orientation: Month: Accurate within 5 days Temporal Orientation: Day: Correct Recall: Sock: Yes, no cue required Recall: Blue: Yes, no cue required Recall: Bed: Yes, no cue required BIMS Summary Score: 15 Sensation Sensation Light Touch: Appears Intact Hot/Cold: Appears Intact Proprioception: Appears Intact Stereognosis: Appears Intact Coordination Gross Motor Movements are Fluid and Coordinated: No Fine Motor Movements are Fluid and Coordinated: No Coordination and Movement Description: slow to initiate, however improved from eval Motor  Motor Motor: Abnormal postural alignment and control;Hemiplegia Motor - Discharge Observations: L hemipareisis, slow to initiate, however improved from eval Mobility  Bed Mobility Bed Mobility: Supine to Sit;Sit to Supine Supine to Sit: Supervision/Verbal cueing Sit to Supine: Supervision/Verbal cueing Transfers Sit to Stand: Contact Guard/Touching assist Stand to Sit: Contact Guard/Touching assist  Trunk/Postural Assessment  Cervical Assessment Cervical Assessment: Exceptions to [facility] (mild forward head) Thoracic Assessment Thoracic Assessment: Exceptions to [facility] (rounded shoulders) Lumbar Assessment Lumbar Assessment: Exceptions to [facility] (posterior pelvic tilt) Postural Control Postural Control: Deficits on evaluation Trunk Control: posterior lean when seated, however improved from baseline Righting Reactions: delayed, however improved from baseline  Balance Balance Balance Assessed: Yes Static Sitting Balance Static  Sitting - Level of Assistance: 5: Stand by assistance Dynamic Sitting Balance Dynamic Sitting - Balance Support: During functional activity;Feet supported Dynamic Sitting - Level of Assistance: 5: Stand by assistance Static Standing Balance Static Standing - Balance Support: Bilateral upper extremity supported Static Standing - Level of Assistance: 5: Stand by assistance;4: Min assist (CGA) Dynamic Standing Balance Dynamic Standing - Balance Support: Bilateral upper extremity supported Dynamic Standing - Level of Assistance: 5: Stand by assistance;4: Min assist (CGA) Extremity/Trunk Assessment RUE Assessment RUE Assessment: Within Functional Limits  General Strength Comments: mild weakness d/t general deconditioning LUE Assessment LUE Assessment: Within Functional Limits General Strength Comments: mild weakness, d/t general deconditioning   [name] [name] 06/08/2024, 3:02 PM

## 2024-06-08 NOTE — Plan of Care (Signed)
  Problem: Consults Goal: RH GENERAL PATIENT EDUCATION Description: See Patient Education module for education specifics. Outcome: Progressing   Problem: RH BOWEL ELIMINATION Goal: RH STG MANAGE BOWEL WITH ASSISTANCE Description: STG Manage Bowel with mod I Assistance. Outcome: Progressing Goal: RH STG MANAGE BOWEL W/MEDICATION W/ASSISTANCE Description: STG Manage Bowel with Medication with mod I Assistance. Outcome: Progressing   Problem: RH BLADDER ELIMINATION Goal: RH STG MANAGE BLADDER WITH ASSISTANCE Description: STG Manage Bladder With mod I Assistance Outcome: Progressing Goal: RH STG MANAGE BLADDER WITH MEDICATION WITH ASSISTANCE Description: STG Manage Bladder With Medication With Assistance. Outcome: Progressing   Problem: RH SAFETY Goal: RH STG ADHERE TO SAFETY PRECAUTIONS W/ASSISTANCE/DEVICE Description: STG Adhere to Safety Precautions With cues  Assistance/Device. Outcome: Progressing   Problem: RH PAIN MANAGEMENT Goal: RH STG PAIN MANAGED AT OR BELOW PT'S PAIN GOAL Description: Pain < 4 with prns Outcome: Progressing   Problem: RH KNOWLEDGE DEFICIT GENERAL Goal: RH STG INCREASE KNOWLEDGE OF SELF CARE AFTER HOSPITALIZATION Description: Patient and spouse will be able to manage care using educational resources for medications, diet independently Outcome: Progressing   Problem: Education: Goal: Ability to describe self-care measures that may prevent or decrease complications (Diabetes Survival Skills Education) will improve Outcome: Progressing Goal: Individualized Educational Video(s) Outcome: Progressing   Problem: Coping: Goal: Ability to adjust to condition or change in health will improve Outcome: Progressing   Problem: Fluid Volume: Goal: Ability to maintain a balanced intake and output will improve Outcome: Progressing   Problem: Health Behavior/Discharge Planning: Goal: Ability to identify and utilize available resources and services will  improve Outcome: Progressing Goal: Ability to manage health-related needs will improve Outcome: Progressing   Problem: Metabolic: Goal: Ability to maintain appropriate glucose levels will improve Outcome: Progressing   Problem: Nutritional: Goal: Maintenance of adequate nutrition will improve Outcome: Progressing Goal: Progress toward achieving an optimal weight will improve Outcome: Progressing   Problem: Skin Integrity: Goal: Risk for impaired skin integrity will decrease Outcome: Progressing   Problem: Tissue Perfusion: Goal: Adequacy of tissue perfusion will improve Outcome: Progressing   Problem: RH Vision Goal: RH LTG Vision (Specify) Outcome: Progressing

## 2024-06-08 NOTE — Plan of Care (Signed)
  Problem: RH Problem Solving Goal: LTG Patient will demonstrate problem solving for (SLP) Description: LTG:  Patient will demonstrate problem solving for basic/complex daily situations with cues  (SLP) Outcome: Completed/Met   Problem: RH Memory Goal: LTG Patient will use memory compensatory aids to (SLP) Description: LTG:  Patient will use memory compensatory aids to recall biographical/new, daily complex information with cues (SLP) Outcome: Completed/Met   Problem: RH Attention Goal: LTG Patient will demonstrate this level of attention during functional activites (SLP) Description: LTG:  Patient will will demonstrate this level of attention during functional activites (SLP) Outcome: Completed/Met   Problem: RH Pre-functional/Other (Specify) Goal: RH LTG SLP (Specify) 2 Description: RH LTG SLP (Specify) 2 Outcome: Completed/Met   Problem: RH Pre-functional/Other (Specify) Goal: RH LTG SLP (Specify) 1 Description: RH LTG SLP (Specify) 1 Outcome: Not Met (cont to require minA)

## 2024-06-08 NOTE — Progress Notes (Signed)
 Speech Language Pathology Daily Session Note  Patient Details  Name: [name] [name] MRN: [id number] Date of Birth: February 10, 1946  Today's Date: 06/08/2024 SLP Individual Time: 0800-0830 SLP Individual Time Calculation (min): 30 min  Short Term Goals: Week 3: SLP Short Term Goal 1 (Week 3): STGs=LTGs due to ELOS  Skilled Therapeutic Interventions: Skilled therapy session focused on cognitive goals. SLP facilitated session by administering the SLUMS to re-assess patients cognitive linguistic functioning compared to evaluation. Patient initiatally scored 14/30 points with notable deficits in short term memory, processing speed, problem solving and attention. This date, patient with an overall score of 16/30 with improvements in problem solving and attention. Patient continues to require minA for recall of items after a delay and benefits from additional time for processing. Patient left in bed with alarm set and call bell in reach. Continue POC  Pain denies  Therapy/Group: Individual Therapy  Dmya Long M.A., CCC-SLP 06/08/2024, 7:42 AM

## 2024-06-08 NOTE — Progress Notes (Incomplete)
 Inpatient Rehabilitation Discharge Medication Review by a Pharmacist  A complete drug regimen review was completed for this patient to identify any potential clinically significant medication issues.  High Risk Drug Classes Is patient taking? Indication by Medication  Antipsychotic No   Anticoagulant No   Antibiotic No   Opioid No   Antiplatelet Yes Aspirin  81- CVA prophylaxis, PVD  Hypoglycemics/insulin  No   Vasoactive Medication Yes Amlodipine  - hypertension Tamsulosin  - urinary retention  Chemotherapy No   Other Yes Rosuvastatin  - hyperlipidemia Memantine  - dementia Pantoprazole  - GERD MVI - supplement     Type of Medication Issue Identified Description of Issue Recommendation(s)  Drug Interaction(s) (clinically significant)     Duplicate Therapy     Allergy     No Medication Administration End Date     Incorrect Dose     Additional Drug Therapy Needed     Significant med changes from prior encounter (inform family/care partners about these prior to discharge). Amlodipine  resumed with lower dose.   PTA carvedilol , losartan, metformin not continued (also off while inpatient).  Communicate relevant medication changes to patient/family members at discharge from CIR.   Monitor blood pressure for any need to adjust dosage.  Restart or discontinue PTA meds as clinically indicated.   Other       Clinically significant medication issues were identified that warrant physician communication and completion of prescribed/recommended actions by midnight of the next day:  No  Name of provider notified for urgent issues identified:   Provider Method of Notification:    Time spent performing this drug regimen review (minutes):  20

## 2024-06-08 NOTE — Progress Notes (Signed)
 Patient ID: [name] [name], male   DOB: 06-21-1946, 78 y.o.   MRN: [id number]  Pt received a bedside commode in 2021 so is not eligible for another on until 2026. Will let wife know will need to privately pay for another one.

## 2024-06-08 NOTE — Progress Notes (Signed)
 PROGRESS NOTE   Subjective/Complaints:    ROS: Patient denies fever, rash, sore throat, blurred vision, dizziness, nausea, vomiting, diarrhea, cough, shortness of breath or chest pain, joint or back/neck pain, headache, or mood change.    Objective:  No results found.  Recent Labs    06/08/24 0657  WBC 7.2  HGB 9.9*  HCT 31.2*  PLT 194     Recent Labs    06/08/24 0657  NA 139  K 4.6  CL 105  CO2 26  GLUCOSE 116*  BUN 21  CREATININE 1.59*  CALCIUM  9.2      Intake/Output Summary (Last 24 hours) at 06/08/2024 0806 Last data filed at 06/07/2024 2200 Gross per 24 hour  Intake 418 ml  Output 200 ml  Net 218 ml        Physical Exam: Vital Signs Blood pressure 121/87, pulse 75, temperature 98.2 F (36.8 C), temperature source Oral, resp. rate 17, height 5' 11 (1.803 m), weight 85 kg, SpO2 100%.    General: No acute distress Mood and affect are appropriate Heart: Regular rate and rhythm no rubs murmurs or extra sounds Lungs: Clear to auscultation, breathing unlabored, no rales or wheezes Abdomen: Positive bowel sounds, soft nontender to palpation, nondistended Extremities: No clubbing, cyanosis, or edema Skin: No evidence of breakdown, no evidence of rash GU- no blood on meatus Psych: pleasant and cooperative  Skin: No evidence of breakdown, no evidence of rash, IV infiltration with RUE forearm but not hand swelling , no erythema or tenderness  Neurologic: Cranial nerves II through XII intact, motor strength is 5/5 in bilateral deltoid, bicep, tricep, grip,3/5 R RIght and 3-/5 left  hip flexor, 4- Right and 3- left knee extensors, 5/5 RIght and 4/5 left ankle dorsiflexor and plantar flexor   Musculoskeletal: Full range of motion in all 4 extremities. No joint swelling Oriented to [place] and place but not time, delayed responses   Prior neuro assessment is c/w today's exam  06/08/2024.   Assessment/Plan: 1. Functional deficits which require 3+ hours per day of interdisciplinary therapy in a comprehensive inpatient rehab setting. Physiatrist is providing close team supervision and 24 hour management of active medical problems listed below. Physiatrist and rehab team continue to assess barriers to discharge/monitor patient progress toward functional and medical goals  Care Tool:  Bathing    Body parts bathed by patient: Right arm, Left arm, Chest, Front perineal area, Abdomen, Right upper leg, Left upper leg, Face, Right lower leg, Left lower leg   Body parts bathed by helper: Buttocks     Bathing assist Assist Level: Minimal Assistance - Patient > 75%     Upper Body Dressing/Undressing Upper body dressing   What is the patient wearing?: Pull over shirt    Upper body assist Assist Level: Set up assist    Lower Body Dressing/Undressing Lower body dressing      What is the patient wearing?: Underwear/pull up, Pants     Lower body assist Assist for lower body dressing: Moderate Assistance - Patient 50 - 74%     Toileting Toileting    Toileting assist Assist for toileting: Maximal Assistance - Patient 25 - 49%  Transfers Chair/bed transfer  Transfers assist     Chair/bed transfer assist level: Minimal Assistance - Patient > 75%     Locomotion Ambulation   Ambulation assist   Ambulation activity did not occur: Safety/medical concerns  Assist level: Minimal Assistance - Patient > 75% Assistive device: Walker-rolling Max distance: 10   Walk 10 feet activity   Assist  Walk 10 feet activity did not occur: Safety/medical concerns  Assist level: Minimal Assistance - Patient > 75% Assistive device: Walker-rolling   Walk 50 feet activity   Assist Walk 50 feet with 2 turns activity did not occur: Safety/medical concerns         Walk 150 feet activity   Assist Walk 150 feet activity did not occur: Safety/medical  concerns         Walk 10 feet on uneven surface  activity   Assist Walk 10 feet on uneven surfaces activity did not occur: Safety/medical concerns         Wheelchair     Assist Is the patient using a wheelchair?: Yes Type of Wheelchair: Manual    Wheelchair assist level: Supervision/Verbal cueing Max wheelchair distance: 100'    Wheelchair 50 feet with 2 turns activity    Assist        Assist Level: Supervision/Verbal cueing   Wheelchair 150 feet activity     Assist      Assist Level: Minimal Assistance - Patient > 75%   Blood pressure 121/87, pulse 75, temperature 98.2 F (36.8 C), temperature source Oral, resp. rate 17, height 5' 11 (1.803 m), weight 85 kg, SpO2 100%.  Medical Problem List and Plan: 1. 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  Antithrombotics: -DVT/anticoagulation:  Pharmaceutical: Lovenox  may d/c amb >100'  age indeterminate asymptomatic Left Fem DVT, per Dr Sheree VVS, cont current meds, no activity restriction              -antiplatelet therapy: continue ASA   3. Pain Management: continue Oxycodone  prn for severe and tylenol  prn for mild pain   10/4-10/5- denies pain at all this AM- con't regimen 4. Mood/Behavior/Sleep: LCSW to follow for evaluation and support.              -antipsychotic agents: N/A   5. Neuropsych/cognition: This patient is not capable of making decisions on his own behalf. 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 Skin/Wound Care: Routine pressure relief measures. Monitor incision for healing Right antecubital IV infiltration    7. Fluids/Electrolytes/Nutrition:  Monitor I/O. Check CMET in am             --offer supplement prn   8. T2DM: Monitor BS ac/hs and use SSI  for elevated BS             --Was on metform 500 mg bid--held due to dye study, consider resuming once kidney function improves   10/4- BG's running 100-118- con't to monitor  10-13 CBG is 108-127, fairly well-controlled continue to monitor  10/18 reasonable control CBG (last 3)  Recent Labs    06/07/24 1633 06/07/24 2114 06/08/24 0601  GLUCAP 184* 114* 115*     9.HTN: Monitor BP  TID--off home meds at this time. BP reviewed and is starting to increase,  Vitals:   06/07/24 2113 06/08/24 0608  BP: 139/87 121/87  Pulse: 87 75  Resp: 18 17  Temp: 98.8 F (37.1 C) 98.2 F (36.8 C)  SpO2: 100% 100%      amlodipine  10mg  started ~1wk ago 10/18 controlled  10. AKI: BUN/SCr 11/1.21 at admission with rise to 19/1.54 post procedure                 Latest Ref Rng & Units 06/08/2024    6:57 AM 06/04/2024    5:52 AM 06/01/2024    5:27 AM  BMP  Glucose 70 - 99 mg/dL 883  887  868   BUN 8 - 23 mg/dL 21  18  13    Creatinine 0.61 - 1.24 mg/dL 8.40  8.53  8.59   Sodium 135 - 145 mmol/L 139  141  140   Potassium 3.5 - 5.1 mmol/L 4.6  3.8  4.6   Chloride 98 - 111 mmol/L 105  108  106   CO2 22 - 32 mmol/L 26  23  25    Calcium  8.9 - 10.3 mg/dL 9.2  9.0  9.0   Creatinine stable 10/20   11. Thrombocytopenia: resolved   12.  ABLA: Recheck stable but + hematuria, neg melena noted,     Latest Ref Rng & Units 06/08/2024    6:57 AM 06/04/2024    5:52 AM 06/03/2024    7:34 PM  CBC  WBC 4.0 - 10.5 K/uL 7.2  5.9  7.1   Hemoglobin 13.0 - 17.0 g/dL 9.9  9.0  9.2   Hematocrit 39.0 - 52.0 % 31.2  27.9  29.6   Platelets 150 - 400 K/uL 194  186  196   Stable 10/20   - 13. Leucocytosis: resolved     14.  H/o dementia vascular: continue Namenda .  Has been seeing neuro as outpt   15.  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  Using walker at OV with VVS 04/08/2024 Pt had repeat MRI lumbar showing progressive L2-3 central stenosis , severe bilateral L4-5 foraminal stenosis, intrathecal T1 signal at L5-S1 Discussed case with Dr Beuford.  Will need f/u with Dr Beuford post discharge    LOS: 22 days A FACE TO FACE EVALUATION WAS PERFORMED  Prentice FORBES Compton 06/08/2024, 8:06 AM

## 2024-06-08 NOTE — Progress Notes (Signed)
 Physical Therapy Discharge Summary  Patient Details  Name: [name] [name] MRN: [id number] Date of Birth: 1946/07/08  Date of Discharge from PT service:June 08, 2024  Today's Date: 06/08/2024 PT Individual Time: 1031-1130 PT Individual Time Calculation (min): 59 min    Patient has met 9 of 9 long term goals due to improved activity tolerance, improved balance, improved postural control, increased strength, functional use of  left upper extremity and left lower extremity, and improved awareness.  Patient to discharge at an ambulatory level CGA. Patient's care partner is independent to provide the necessary physical assistance at discharge.  Reasons goals not met: n/a  Recommendation:  Patient will benefit from ongoing skilled PT services in home health setting to continue to advance safe functional mobility, address ongoing impairments in strength, coordination, balance, activity tolerance, cognition, safety awareness, and to minimize fall risk.  Equipment: No equipment provided Has RW and w/c at home.   Reasons for discharge: treatment goals met and discharge from hospital  Patient/family agrees with progress made and goals achieved: Yes  PT Discharge Precautions/Restrictions Precautions Precautions: Fall Recall of Precautions/Restrictions: Impaired Precaution/Restrictions Comments: LLE weakness Restrictions Weight Bearing Restrictions Per Provider Order: No Vital Signs   Pain Pain Assessment Pain Scale: 0-10 Pain Score: 0-No pain Pain Interference Pain Interference Pain Effect on Sleep: 1. Rarely or not at all Pain Interference with Therapy Activities: 1. Rarely or not at all Pain Interference with Day-to-Day Activities: 1. Rarely or not at all Vision/Perception  Vision - History Ability to See in Adequate Light: 1 Impaired Vision - Assessment Additional Comments: wears glasses for reading/ distance Perception Perception: Within Functional Limits Praxis Praxis:  Impaired Praxis Impairment Details: Motor planning (mild - improving from eval)  Cognition Overall Cognitive Status: Impaired/Different from baseline Arousal/Alertness: Awake/alert Orientation Level: Oriented X4 Attention: Focused;Sustained Focused Attention: Appears intact Sustained Attention: Appears intact Memory: Impaired Awareness: Impaired Problem Solving: Impaired Executive Function: Self Monitoring;Self Correcting;Initiating Reasoning: Impaired Self Monitoring: Impaired Self Correcting: Impaired Safety/Judgment: Impaired Sensation Sensation Light Touch: Appears Intact Coordination Gross Motor Movements are Fluid and Coordinated: No Fine Motor Movements are Fluid and Coordinated: No Coordination and Movement Description: slow to initiate and slow but correct movement patterns - improving from eval Motor  Motor Motor: Abnormal postural alignment and control;Hemiplegia Motor - Discharge Observations: L hemipareisis - slow to initiate and slow but correct movement patterns - improving from eval  Mobility Bed Mobility Bed Mobility: Supine to Sit;Sit to Supine Supine to Sit: Supervision/Verbal cueing Sit to Supine: Supervision/Verbal cueing Transfers Transfers: Sit to Stand;Stand to Sit;Stand Pivot Transfers;Squat Pivot Transfers Sit to Stand: Supervision/Verbal cueing Stand to Sit: Supervision/Verbal cueing Stand Pivot Transfers: Supervision/Verbal cueing;Contact Guard/Touching assist Squat Pivot Transfers: Contact Guard/Touching assist Locomotion  Gait Ambulation: Yes Gait Assistance: Contact Guard/Touching assist;Minimal Assistance - Patient > 75% Assistive device: Rolling walker Gait Assistance Details:  (vc/ tc for L hip activation and upright posture) Gait Gait: Yes Gait Pattern: Step-through pattern;Decreased hip/knee flexion - left;Poor foot clearance - left;Trunk flexed Gait velocity: decreased Stairs / Additional Locomotion Stairs: Yes Stairs Assistance:  Contact Guard/Touching assist;Minimal Assistance - Patient > 75% Stair Management Technique: With walker Height of Stairs: 5 Ramp: Contact Guard/touching assist Curb: Doctor, hospital Mobility: Yes Wheelchair Assistance: Doctor, general practice: Both lower extermities Wheelchair Parts Management: Needs assistance  Trunk/Postural Assessment  Cervical Assessment Cervical Assessment: Exceptions to [facility] (forward head) Thoracic Assessment Thoracic Assessment: Exceptions to [facility] (rounded shoulders with kyphotic posturing) Lumbar Assessment Lumbar Assessment: Exceptions to  WFL (posterior pelvic tilt) Postural Control Postural Control: Deficits on evaluation Trunk Control: posterior lean when seated, however improved from baseline Righting Reactions: delayed, however improved from baseline  Balance Balance Balance Assessed: Yes Static Sitting Balance Static Sitting - Balance Support: No upper extremity supported;Feet supported Static Sitting - Level of Assistance: 5: Stand by assistance (posterior bias on softer surfaces) Dynamic Sitting Balance Dynamic Sitting - Balance Support: During functional activity;Feet supported;No upper extremity supported Dynamic Sitting - Level of Assistance: 5: Stand by assistance Dynamic Sitting Balance - Compensations: posterior bias especially on softer surfaces Static Standing Balance Static Standing - Balance Support: Bilateral upper extremity supported Static Standing - Level of Assistance: Other (comment) (CGA) Dynamic Standing Balance Dynamic Standing - Balance Support: Bilateral upper extremity supported;During functional activity Dynamic Standing - Level of Assistance:  (CGA) Extremity Assessment      RLE Assessment RLE Assessment: Exceptions to [facility] RLE Strength RLE Overall Strength: Deficits Right Hip Flexion: 4-/5 Right Hip Extension: 4-/5 Right Hip ABduction: 4/5 Right  Hip ADduction: 4/5 Right Knee Flexion: 3+/5 Right Knee Extension: 4/5 Right Ankle Dorsiflexion: 4/5 Right Ankle Plantar Flexion: 4/5 LLE Assessment LLE Assessment: Exceptions to WFL LLE Strength LLE Overall Strength: Deficits Left Hip Flexion: 3+/5 Left Hip Extension: 3+/5 Left Hip ABduction: 3+/5 Left Hip ADduction: 3+/5 Left Knee Flexion: 3+/5 Left Knee Extension: 4-/5 Left Ankle Dorsiflexion: 4-/5 Left Ankle Plantar Flexion: 4-/5  Skilled Intervention: PT instructed pt in Grad day assessment to measure progress toward goals. See above for details. CARETool mobility assessment  also completed; see CAREtool tab in navigator for details.  Patient seated in w/c on entrance to room. Patient alert and agreeable to PT session.   Patient with no pain complaint at start of session.  Pt assessed in quality of transfers and LOA, quality of ambulation and activity tolerance, balance. Is able to perform all as described above but continues to require intermittent cues for initiation, forward weight shifting and safety awareness.   Car transfer performed well and may require up to MinA in future for assist with LLE and safety awareness in hand placement for steadying.   Continues in performance of ambulation with facilitation of tc to L glute med and max for increased activation to reduce trendelenburg and prevent increased weight shift laterally.   Guided pt in below HEP provided to pt. Performed 1x10 each.    Access Code: [email or web address] URL: [email or web address] Date: 06/08/2024 Prepared by: Mliss Milliner  Exercises - Side Lunge with Counter Support  - 1 x daily - 7 x weekly - 3 sets - 10 reps - Mini Squat with Counter Support  - 1 x daily - 7 x weekly - 3 sets - 10 reps - Standing Hip Abduction with Counter Support  - 1 x daily - 7 x weekly - 3 sets - 10 reps - Standing Hip Extension with Counter Support  - 1 x daily - 7 x weekly - 3 sets - 10 reps - Heel Raises with  Counter Support  - 1 x daily - 7 x weekly - 3 sets - 10 reps  Patient seated upright in w/c at end of session with brakes locked, belt alarm set, and all needs within reach.  [name] PT, DPT, CSRS 06/08/2024, 12:58 PM

## 2024-06-08 NOTE — Plan of Care (Signed)
  Problem: RH Bathing Goal: LTG Patient will bathe all body parts with assist levels (OT) Description: LTG: Patient will bathe all body parts with assist levels (OT) Outcome: Adequate for Discharge Note: Pt requires MIN A to wash posterior peri area, however wife able to provide assistance.   Problem: RH Eating Goal: LTG Patient will perform eating w/assist, cues/equip (OT) Description: LTG: Patient will perform eating with assist, with/without cues using equipment (OT) Outcome: Completed/Met   Problem: RH Grooming Goal: LTG Patient will perform grooming w/assist,cues/equip (OT) Description: LTG: Patient will perform grooming with assist, with/without cues using equipment (OT) Outcome: Completed/Met   Problem: RH Dressing Goal: LTG Patient will perform upper body dressing (OT) Description: LTG Patient will perform upper body dressing with assist, with/without cues (OT). Outcome: Completed/Met Goal: LTG Patient will perform lower body dressing w/assist (OT) Description: LTG: Patient will perform lower body dressing with assist, with/without cues in positioning using equipment (OT) Outcome: Completed/Met   Problem: RH Toileting Goal: LTG Patient will perform toileting task (3/3 steps) with assistance level (OT) Description: LTG: Patient will perform toileting task (3/3 steps) with assistance level (OT)  Outcome: Completed/Met   Problem: RH Functional Use of Upper Extremity Goal: LTG Patient will use RT/LT upper extremity as a (OT) Description: LTG: Patient will use right/left upper extremity as a stabilizer/gross assist/diminished/nondominant/dominant level with assist, with/without cues during functional activity (OT) Outcome: Completed/Met   Problem: RH Toilet Transfers Goal: LTG Patient will perform toilet transfers w/assist (OT) Description: LTG: Patient will perform toilet transfers with assist, with/without cues using equipment (OT) Outcome: Completed/Met   Problem: RH  Tub/Shower Transfers Goal: LTG Patient will perform tub/shower transfers w/assist (OT) Description: LTG: Patient will perform tub/shower transfers with assist, with/without cues using equipment (OT) Outcome: Completed/Met

## 2024-06-08 NOTE — Progress Notes (Signed)
 Speech Language Pathology Discharge Summary  Patient Details  Name: [name] [name] MRN: [id number] Date of Birth: 18-Dec-1945  Date of Discharge from SLP service:June 08, 2024  Patient has met 4 of 5 long term goals.  Patient to discharge at [facility] level.  Reasons goals not met: cont to require increased A for thought formulation   Clinical Impression/Discharge Summary: Pt has made good gains and has met 4 of 5 LTG's this admission due to improved cognition. Pt is currently an overall supervision-minA for cognitive tasks including short term memory, problem solving, attention and auditory comprehension. Patient did not meet verbal thought formulation goal due to continuing to require minA to organize thoughts during complex conversations. Pt/family education complete and pt will discharge home with 24 hour supervision from friends/family/etc. Pt would benefit from f/u ST services to maximize cognition in order to maximize functional independence.   Care Partner:  Caregiver Able to Provide Assistance: Yes  Type of Caregiver Assistance: Physical;Cognitive  Recommendation:  24 hour supervision/assistance;Outpatient SLP;Home Health SLP  Rationale for SLP Follow Up: Maximize cognitive function and independence   Equipment: n/a   Reasons for discharge: Discharged from hospital   Patient/Family Agrees with Progress Made and Goals Achieved: Yes    Fantasy Donald M.A., CCC-SLP 06/08/2024, 8:30 AM

## 2024-06-08 NOTE — Progress Notes (Signed)
 Occupational Therapy Session Note  Patient Details  Name: [name] [name] MRN: [id number] Date of Birth: 06/05/1946  Today's Date: 06/08/2024 OT Individual Time: 0902-1000 OT Individual Time Calculation (min): 58 min    Short Term Goals: Week 3:  OT Short Term Goal 1 (Week 3): STGs=LTGs d/t ELOS  Skilled Therapeutic Interventions/Progress Updates:    Pt received resting in bed presenting to be in good spirits receptive to skilled OT session reporting 0/10 pain- OT offering intermittent rest breaks, repositioning, and therapeutic support to optimize participation in therapy session. Supine>EOB with SUP and increased time. Donned shoes while sitting EOB with MAX A d/t time management. Completed functional mobility utilizing RW to bathroom with MOD- intermittent MAX A for body awareness and postioning. Stand>sit to elevated toilet seat with MOD A and MAX multimodal cues provided for body awareness and RW management. Provided time for toileting, however Pt unable to void at this time. Completed functional mobility to shower with MIN A and MOD multimodal cues for body awareness and RW management. Pt reported he was not out of bed all weekend d/t and appeared to be weak and fatigued this session. Completed shower while seated on TTB utilizing long handled sponge with MIN A to wash posterior peri areas while standing utilizing grab bar for balance. Stand pivot TTB>WC utilizing RW with MIN A for safety and MOD multimodal cues for body awareness and RW management. Completed U/LB dressing while seated in WC at the sink with SUP for UB, MIN cues for using reacher to weave RLE, MIN A for pulling pants over hips while standing utilizing RW for balance, and MAX A for socks and shoes d/t time management. Completed hair brushing and oral care while seated at the sink with SUP. Engaged Pt in memory notebook to recall tasks completed throughout the session. Pt was left resting in WC with call bell in reach, seatbelt  alarm on, and all needs met.    Therapy Documentation Precautions:  Precautions Precautions: Fall Recall of Precautions/Restrictions: Impaired Precaution/Restrictions Comments: incision near L clavicle (axillary artery) and bil groins; L weakness Restrictions Weight Bearing Restrictions Per Provider Order: No General:   Vital Signs: Therapy Vitals Temp: 98.2 F (36.8 C) Temp Source: Oral Pulse Rate: 75 Resp: 17 BP: 121/87 Patient Position (if appropriate): Lying Oxygen Therapy SpO2: 100 % O2 Device: Room Air Pain: Pain Assessment Pain Scale: 0-10 Pain Score: 0-No pain ADL: ADL Eating: Set up Grooming: Minimal assistance Where Assessed-Grooming: Sitting at sink Upper Body Bathing: Minimal assistance Where Assessed-Upper Body Bathing: Wheelchair, Sitting at sink Lower Body Bathing: Dependent Where Assessed-Lower Body Bathing: Sitting at sink, Wheelchair Upper Body Dressing: Moderate assistance Where Assessed-Upper Body Dressing: Wheelchair Lower Body Dressing: Dependent Where Assessed-Lower Body Dressing: Wheelchair Toileting: Dependent Toilet Transfer: Dependent Statistician Method: Surveyor, minerals: Grab bars, Raised toilet seat Tub/Shower Transfer: Unable to assess Tub/Shower Transfer Method: Unable to assess Film/video editor: Unable to assess Film/video editor Method: Unable to assess ADL Comments: Not safe for shower stall transfer, Patient with significant posterior lean and motor planning deficits. Vision   Perception    Praxis   Balance   Exercises:   Other Treatments:     Therapy/Group: Individual Therapy  [name] [name] 06/08/2024, 9:50 AM

## 2024-06-09 ENCOUNTER — Other Ambulatory Visit (HOSPITAL_COMMUNITY): Payer: Self-pay

## 2024-06-09 DIAGNOSIS — I739 Peripheral vascular disease, unspecified: Secondary | ICD-10-CM

## 2024-06-09 DIAGNOSIS — N3001 Acute cystitis with hematuria: Secondary | ICD-10-CM

## 2024-06-09 DIAGNOSIS — I63541 Cerebral infarction due to unspecified occlusion or stenosis of right cerebellar artery: Secondary | ICD-10-CM | POA: Diagnosis not present

## 2024-06-09 DIAGNOSIS — M48061 Spinal stenosis, lumbar region without neurogenic claudication: Secondary | ICD-10-CM

## 2024-06-09 LAB — GLUCOSE, CAPILLARY: Glucose-Capillary: 123 mg/dL — ABNORMAL HIGH (ref 70–99)

## 2024-06-09 MED ORDER — CEPHALEXIN 500 MG PO CAPS
500.0000 mg | ORAL_CAPSULE | Freq: Two times a day (BID) | ORAL | 0 refills | Status: DC
  Filled 2024-06-09: qty 10, 5d supply, fill #0

## 2024-06-09 MED ORDER — CEPHALEXIN 250 MG PO CAPS
500.0000 mg | ORAL_CAPSULE | Freq: Two times a day (BID) | ORAL | Status: DC
  Administered 2024-06-09: 500 mg via ORAL
  Filled 2024-06-09: qty 2

## 2024-06-09 NOTE — Progress Notes (Signed)
 Inpatient Rehabilitation Care Coordinator Discharge Note   Patient Details  Name: [name] [name] MRN: [id number] Date of Birth: 1946-06-21   Discharge location: HOME WITH WIFE WHO IS AWARE OF HIS CARE NEEDS-24/7  Length of Stay: 23 DAYS  Discharge activity level: CGA WITH CUES  Home/community participation: LIMITED BY HEALTH ISSUES  Patient response un:Yzjouy Literacy - How often do you need to have someone help you when you read instructions, pamphlets, or other written material from your doctor or pharmacy?: Never  Patient response un:Dnrpjo Isolation - How often do you feel lonely or isolated from those around you?: Never  Services provided included: MD, RD, PT, OT, SLP, RN, CM, TR, Pharmacy, SW  Financial Services:  Field seismologist Utilized: Private Insurance [facility] MEDICARE  Choices offered to/list presented to: WIFE  Follow-up services arranged:  Home Health, Patient/Family request agency HH/DME Home Health Agency: [facility]  PT  OT  RN SP AIDE  HAS ALL DME FROM PAST ADMISSIONS IF WANTS ANOTHER BEDSIDE COMMODE WILL NEED TO PURCHASE ONE   HH/DME Requested Agency: [facility]  Patient response to transportation need: Is the patient able to respond to transportation needs?: Yes In the past 12 months, has lack of transportation kept you from medical appointments or from getting medications?: No In the past 12 months, has lack of transportation kept you from meetings, work, or from getting things needed for daily living?: No   Patient/Family verbalized understanding of follow-up arrangements:  Yes  Individual responsible for coordination of the follow-up plan: [name] [name] [phone]  Confirmed correct DME delivered: [name] [name] MATSU 06/09/2024    Comments (or additional information):WIFE WAS IN FOR HANDS ON EDUCATION AND AT TIMES DOES NOT STAND CLOSE ENOUGH OR CUE PT. HE IS AT RISK TO FALL AT HOME DUE TO THIS. OTHER FAMILY MEMBERS TO ASSIST  ACCORDING TO WIFE  Summary of Stay    Date/Time Discharge Planning CSW  06/03/24 [id number] Wife has been here to observe and participate in therapies, when asked if can do his care she is not sure. She is asking other family members if can assist at discharge. Aware of services available and seems to want more assist with him from them at home. RGD  05/27/24 0857 Wife has been here and observed in therapies, she continues to want to take home, although he will be physical care. Will discuss again if wife able to take husband home from here. RGD  05/20/24 0902 Wife plans to take husband home and provide assist. May need to come in and observe in therapies to see the amount of care pt will require. She has helped prior to admission with bathing and dressing. Was in NH in July RGD       Adiel Mcnamara, [name] MATSU

## 2024-06-09 NOTE — Plan of Care (Signed)
 Continue current plan of care. Patient is expected to discharge on 10/21

## 2024-06-09 NOTE — Plan of Care (Signed)
 Continue plan of care.

## 2024-06-09 NOTE — Progress Notes (Addendum)
 PROGRESS NOTE   Subjective/Complaints:  No new issues, had hematuria , UA + for infx No dysuria  ROS: Patient denies fever, rash, sore throat, blurred vision, dizziness, nausea, vomiting, diarrhea, cough, shortness of breath or chest pain, joint or back/neck pain, headache, or mood change.    Objective:  No results found.  Recent Labs    06/08/24 0657  WBC 7.2  HGB 9.9*  HCT 31.2*  PLT 194     Recent Labs    06/08/24 0657  NA 139  K 4.6  CL 105  CO2 26  GLUCOSE 116*  BUN 21  CREATININE 1.59*  CALCIUM  9.2      Intake/Output Summary (Last 24 hours) at 06/09/2024 0746 Last data filed at 06/08/2024 2030 Gross per 24 hour  Intake 1156 ml  Output --  Net 1156 ml        Physical Exam: Vital Signs Blood pressure (!) 157/95, pulse 74, temperature 97.7 F (36.5 C), resp. rate 17, height 5' 11 (1.803 m), weight 85 kg, SpO2 100%.    General: No acute distress Mood and affect are appropriate Heart: Regular rate and rhythm no rubs murmurs or extra sounds Lungs: Clear to auscultation, breathing unlabored, no rales or wheezes Abdomen: Positive bowel sounds, soft nontender to palpation, nondistended Extremities: No clubbing, cyanosis, or edema Skin: No evidence of breakdown, no evidence of rash GU- no blood on meatus Psych: pleasant and cooperative  Skin: No evidence of breakdown, no evidence of rash, IV infiltration with RUE forearm but not hand swelling , no erythema or tenderness  Neurologic: Cranial nerves II through XII intact, motor strength is 5/5 in bilateral deltoid, bicep, tricep, grip,3/5 R RIght and 3-/5 left  hip flexor, 4- Right and 3- left knee extensors, 5/5 RIght and 4/5 left ankle dorsiflexor and plantar flexor   Musculoskeletal: Full range of motion in all 4 extremities. No joint swelling Oriented to [place] and place but not time, delayed responses   Prior neuro assessment is c/w  today's exam 06/09/2024.   Assessment/Plan: 1. Functional deficits due to debility after aortic aneurysm repain  Stable for D/C today F/u PCP in 1-2 weeks F/u PM&R 2-3 weeks F/u VVS per office F/u ortho spine Dr Beuford pt to call  See D/C summary See D/C instructions   Care Tool:  Bathing    Body parts bathed by patient: Right arm, Left arm, Chest, Front perineal area, Abdomen, Right upper leg, Left upper leg, Face, Right lower leg, Left lower leg, Buttocks   Body parts bathed by helper: Buttocks     Bathing assist Assist Level: Minimal Assistance - Patient > 75%     Upper Body Dressing/Undressing Upper body dressing   What is the patient wearing?: Pull over shirt    Upper body assist Assist Level: Set up assist    Lower Body Dressing/Undressing Lower body dressing      What is the patient wearing?: Pants, Underwear/pull up     Lower body assist Assist for lower body dressing: Minimal Assistance - Patient > 75%     Toileting Toileting    Toileting assist Assist for toileting: Minimal Assistance - Patient > 75%  Transfers Chair/bed transfer  Transfers assist     Chair/bed transfer assist level: Contact Guard/Touching assist     Locomotion Ambulation   Ambulation assist   Ambulation activity did not occur: Safety/medical concerns  Assist level: Minimal Assistance - Patient > 75% Assistive device: Walker-rolling Max distance: 160 ft   Walk 10 feet activity   Assist  Walk 10 feet activity did not occur: Safety/medical concerns  Assist level: Minimal Assistance - Patient > 75% Assistive device: Walker-rolling   Walk 50 feet activity   Assist Walk 50 feet with 2 turns activity did not occur: Safety/medical concerns  Assist level: Minimal Assistance - Patient > 75% Assistive device: Walker-rolling    Walk 150 feet activity   Assist Walk 150 feet activity did not occur: Safety/medical concerns  Assist level: Minimal Assistance -  Patient > 75% Assistive device: Walker-rolling    Walk 10 feet on uneven surface  activity   Assist Walk 10 feet on uneven surfaces activity did not occur: Safety/medical concerns   Assist level: Minimal Assistance - Patient > 75% Assistive device: Development worker, international aid     Assist Is the patient using a wheelchair?: Yes Type of Wheelchair: Manual    Wheelchair assist level: Supervision/Verbal cueing Max wheelchair distance: 150 ft    Wheelchair 50 feet with 2 turns activity    Assist        Assist Level: Supervision/Verbal cueing   Wheelchair 150 feet activity     Assist      Assist Level: Contact Guard/Touching assist   Blood pressure (!) 157/95, pulse 74, temperature 97.7 F (36.5 C), resp. rate 17, height 5' 11 (1.803 m), weight 85 kg, SpO2 100%.  Medical Problem List and Plan: 1. 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  Antithrombotics: -DVT/anticoagulation:  Pharmaceutical: Lovenox  may d/c amb >100'  age indeterminate asymptomatic Left Fem DVT, per Dr Sheree VVS, cont current meds, no activity restriction              -antiplatelet therapy: continue ASA   3. Pain Management: continue Oxycodone  prn for severe and tylenol  prn for mild pain   10/4-10/5- denies pain at all this AM- con't regimen 4. Mood/Behavior/Sleep: LCSW to follow for evaluation and support.              -antipsychotic agents: N/A   5. Neuropsych/cognition: This patient is not capable of making decisions on his own behalf. 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 Skin/Wound Care: Routine pressure relief measures. Monitor incision for healing Right antecubital IV infiltration    7. Fluids/Electrolytes/Nutrition:  Monitor I/O. Check  CMET in am             --offer supplement prn   8. T2DM: Monitor BS ac/hs and use SSI for elevated BS             --Was on metform 500 mg bid--held due to dye study, consider resuming once kidney function improves   10/4- BG's running 100-118- con't to monitor  10-13 CBG is 108-127, fairly well-controlled continue to monitor  10/18 reasonable control CBG (last 3)  Recent Labs    06/08/24 1634 06/08/24  2031 06/09/24 0610  GLUCAP 158* 174* 123*     9.HTN: Monitor BP TID--off home meds at this time. BP reviewed and is starting to increase,  Vitals:   06/08/24 1950 06/09/24 0521  BP: 132/88 (!) 157/95  Pulse: 86 74  Resp: 19 17  Temp: 98 F (36.7 C) 97.7 F (36.5 C)  SpO2: 100% 100%      amlodipine  10mg  started ~1wk ago 10/18 controlled  10. AKI: BUN/SCr 11/1.21 at admission with rise to 19/1.54 post procedure                 Latest Ref Rng & Units 06/08/2024    6:57 AM 06/04/2024    5:52 AM 06/01/2024    5:27 AM  BMP  Glucose 70 - 99 mg/dL 883  887  868   BUN 8 - 23 mg/dL 21  18  13    Creatinine 0.61 - 1.24 mg/dL 8.40  8.53  8.59   Sodium 135 - 145 mmol/L 139  141  140   Potassium 3.5 - 5.1 mmol/L 4.6  3.8  4.6   Chloride 98 - 111 mmol/L 105  108  106   CO2 22 - 32 mmol/L 26  23  25    Calcium  8.9 - 10.3 mg/dL 9.2  9.0  9.0   Creatinine stable 10/20   11. Thrombocytopenia: resolved   12.  ABLA: Recheck stable but + hematuria, neg melena noted,     Latest Ref Rng & Units 06/08/2024    6:57 AM 06/04/2024    5:52 AM 06/03/2024    7:34 PM  CBC  WBC 4.0 - 10.5 K/uL 7.2  5.9  7.1   Hemoglobin 13.0 - 17.0 g/dL 9.9  9.0  9.2   Hematocrit 39.0 - 52.0 % 31.2  27.9  29.6   Platelets 150 - 400 K/uL 194  186  196   Stable 10/20   - 13. Leucocytosis: resolved     14.  H/o dementia vascular: continue Namenda .  Has been seeing neuro as outpt   15.  Bilateral LE weakness proximally and Left distal LE weakness with hx Lumbar stenosis , suspect progression since prior  Lumbar MRI in 2021, pt has had L3-4 fusion per Dr Beuford in 2021- some improvement in LE strength , pt feels decline in strength was post op after aneurysm repair, however pt has hx of dementia and is poor historian, had Hospital admit July 2025 and was reportedly using walker at baseline and went to SNF.  Using walker at OV with VVS 04/08/2024 Pt had repeat MRI lumbar showing progressive L2-3 central stenosis , severe bilateral L4-5 foraminal stenosis, intrathecal T1 signal at L5-S1 Discussed case with Dr Beuford.  Will need f/u with Dr Beuford post discharge   16.  UTI recurrent 5d course of Keflex  LOS: 23 days A FACE TO FACE EVALUATION WAS PERFORMED  Prentice FORBES Compton 06/09/2024, 7:46 AM

## 2024-06-09 NOTE — Progress Notes (Signed)
 Inpatient Rehabilitation Discharge Medication Review by a Pharmacist  A complete drug regimen review was completed for this patient to identify any potential clinically significant medication issues.  High Risk Drug Classes Is patient taking? Indication by Medication  Antipsychotic No   Anticoagulant No   Antibiotic Yes Keflex  x 5 days for UTI treatment  Opioid No   Antiplatelet Yes Aspirin  81- CVA prophylaxis, PVD  Hypoglycemics/insulin  No   Vasoactive Medication Yes Amlodipine  - hypertension Tamsulosin  - urinary retention  Chemotherapy No   Other Yes Acetaminophen  - pain Rosuvastatin  - hyperlipidemia Memantine  - dementia Pantoprazole  - GERD PEG - bowel regimen MVI - supplement Melatonin - sleep     Type of Medication Issue Identified Description of Issue Recommendation(s)  Drug Interaction(s) (clinically significant)     Duplicate Therapy     Allergy     No Medication Administration End Date     Incorrect Dose     Additional Drug Therapy Needed     Significant med changes from prior encounter (inform family/care partners about these prior to discharge). STOP carvedilol , hydralazine , losartan - BP controlled on amlodipine  alone.  STOP Metformin  STOP Clopidogrel - course completed.  Communicate relevant medication changes to patient/family members at discharge from CIR.   Restart or discontinue PTA meds as clinically indicated.   Other       Clinically significant medication issues were identified that warrant physician communication and completion of prescribed/recommended actions by midnight of the next day:  No  Name of provider notified for urgent issues identified:   Provider Method of Notification:    Time spent performing this drug regimen review (minutes):  20   Cybil Senegal, Pharm.D., BCPS Clinical Pharmacist Clinical phone for 06/09/2024 from 7:30-3:00 is [phone].  **Pharmacist phone directory can be found on [email or web address] listed under [facility]  Pharmacy.  06/09/2024 11:22 AM

## 2024-06-11 DIAGNOSIS — I69322 Dysarthria following cerebral infarction: Secondary | ICD-10-CM | POA: Diagnosis not present

## 2024-06-11 DIAGNOSIS — Z23 Encounter for immunization: Secondary | ICD-10-CM | POA: Diagnosis not present

## 2024-06-11 DIAGNOSIS — D696 Thrombocytopenia, unspecified: Secondary | ICD-10-CM | POA: Diagnosis not present

## 2024-06-11 DIAGNOSIS — M48061 Spinal stenosis, lumbar region without neurogenic claudication: Secondary | ICD-10-CM | POA: Diagnosis not present

## 2024-06-11 DIAGNOSIS — I69359 Hemiplegia and hemiparesis following cerebral infarction affecting unspecified side: Secondary | ICD-10-CM | POA: Diagnosis not present

## 2024-06-11 DIAGNOSIS — I10 Essential (primary) hypertension: Secondary | ICD-10-CM | POA: Diagnosis not present

## 2024-06-11 DIAGNOSIS — I639 Cerebral infarction, unspecified: Secondary | ICD-10-CM | POA: Diagnosis not present

## 2024-06-11 DIAGNOSIS — Z0000 Encounter for general adult medical examination without abnormal findings: Secondary | ICD-10-CM | POA: Diagnosis not present

## 2024-06-11 DIAGNOSIS — E1140 Type 2 diabetes mellitus with diabetic neuropathy, unspecified: Secondary | ICD-10-CM | POA: Diagnosis not present

## 2024-06-11 DIAGNOSIS — N1831 Chronic kidney disease, stage 3a: Secondary | ICD-10-CM | POA: Diagnosis not present

## 2024-06-17 ENCOUNTER — Ambulatory Visit: Admitting: Vascular Surgery

## 2024-06-17 NOTE — Addendum Note (Signed)
 Addended by: RAYNA MOATS A on: 06/17/2024 01:41 PM   Modules accepted: Orders

## 2024-06-19 ENCOUNTER — Ambulatory Visit (HOSPITAL_COMMUNITY)
Admission: RE | Admit: 2024-06-19 | Discharge: 2024-06-19 | Disposition: A | Discharge status: Home / Self Care | Source: Ambulatory Visit | Attending: Vascular Surgery | Admitting: Vascular Surgery

## 2024-06-19 ENCOUNTER — Other Ambulatory Visit: Payer: Self-pay | Admitting: Vascular Surgery

## 2024-06-19 DIAGNOSIS — I7103 Dissection of thoracoabdominal aorta: Secondary | ICD-10-CM | POA: Diagnosis present

## 2024-06-19 MED ORDER — APIXABAN (ELIQUIS) VTE STARTER PACK (10MG AND 5MG): ORAL_TABLET | ORAL | 0 refills | Status: DC

## 2024-06-19 MED ORDER — IOHEXOL 350 MG/ML SOLN
100.0000 mL | Freq: Once | INTRAVENOUS | Status: AC | PRN
Start: 2024-06-19 — End: 2024-06-19
  Administered 2024-06-19: 100 mL via INTRAVENOUS

## 2024-06-19 NOTE — Progress Notes (Signed)
    I was called report by radiology about pulmonary embolus on CTA evaluating his graft.  He also has a type II endoleak around his recently placed TAMBE this does not appear to be causing any acute issues.  I have sent Eliquis  to his pharmacy and I called and discussed the findings with he and his wife.  They demonstrate good understanding and understand that they will require 6 months of therapy.  He will keep regularly scheduled follow-up with me in the office.  Kanoa Phillippi C. Sheree, MD [facility] Office: [phone] Pager: [phone]

## 2024-06-22 ENCOUNTER — Encounter: Payer: Self-pay | Admitting: Registered Nurse

## 2024-06-22 ENCOUNTER — Encounter: Attending: Registered Nurse | Admitting: Registered Nurse

## 2024-06-22 VITALS — BP 169/89 | HR 76 | Ht 71.0 in

## 2024-06-22 DIAGNOSIS — I10 Essential (primary) hypertension: Secondary | ICD-10-CM | POA: Diagnosis present

## 2024-06-22 DIAGNOSIS — I63541 Cerebral infarction due to unspecified occlusion or stenosis of right cerebellar artery: Secondary | ICD-10-CM | POA: Diagnosis not present

## 2024-06-22 DIAGNOSIS — Z8679 Personal history of other diseases of the circulatory system: Secondary | ICD-10-CM | POA: Insufficient documentation

## 2024-06-22 DIAGNOSIS — Z9889 Other specified postprocedural states: Secondary | ICD-10-CM | POA: Insufficient documentation

## 2024-06-22 NOTE — Patient Instructions (Addendum)
 Purchase a Blood Pressure Cuff.   Call Primary Care Physician with Blood Pressure readings:   Today's Blood Pressure reading was 169/89

## 2024-06-22 NOTE — Progress Notes (Signed)
 Subjective:    Patient ID: [name] [name], male    DOB: February 19, 1946, 78 y.o.   MRN: [id number]  HPI: [name] [name] is a 78 y.o. male who is here for HFU appointment for F/U of his Cerebral infarction involving right cerebellar artery, History of repair of dissectibg thoracic aneurysm ans Essential Hypertension. He was evaluated at [facility] ED on 02/29/2024 for Altered Mental Status.  Dr. Raoul H&P: on 02/29/2024 Patient presents with: Altered Mental Status     [name] [name] is a 78 y.o. 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 Per patient's wife, he has been acting unlike himself since last night at 8 PM, states that when she speaks to him he does not seem to register what she is saying.  She otherwise denies any changes in his speech or the patient himself not making any sense.  Patient was trying to change his Depend earlier this morning after defecating in it and spent almost 2 hours on the toilet attempting to put a new Depend on, was having increased difficulty due to weakness of one of his lower extremities.  Neither the patient nor the wife can recall which lower extremity was weak.  Patient does reportedly have bilateral leg weakness ever since his prior CVA.  Patient states that he does not currently feel confused and has no current complaints.  He states that he had transient chest pain 3 nights ago but no chest pain/shortness of breath since.  No back pain, abdominal pain, N/V, or bloody stools.   CT Head WO Contrast CTA CT Abdomen and Pelvis IMPRESSION: 1. No acute intracranial CT findings. Stable atrophy and small-vessel changes. 2. Interval lens extractions/replacements with dysconjugate gaze. 3. Endoluminal aortic stent graft again extends from the junction of the isthmus and descending  segment ending in the hiatal segment, with increased type 1A endoleak from the posterior aspect of the proximal landing zone, and at least a small type 2 contribution from an intercostal artery. Contrast is seen in the excluded aneurysm sac along the anteromedial aspect to the level of T7, and the descending aorta has increased in size from 4.3 cm in 2021 to 5.0 x 5.4 cm today. 4. Abdominal aortic dissection again extends from just below the aortic stent graft into the infrarenal segment terminating above the aortic bifurcation. The false lumen is patent but with increased near circumferential thrombus since the prior studies. 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 Vascular surgery referral recommended unless already done. 5. Dissection flap again extends into the SMA up to the first main bifurcation, the true lumen supplies about 40% of the overall vessel diameter with the false lumen up to 60%. The false lumen largely supplies the main right-sided SMA division and the true lumen supplies the remainder. 6. Small layering left pleural effusion with fluid density slightly above simple fluid inferiorly. Pleural space hemorrhage would be expected to be denser and more heterogeneous but the pleural effusion etiology is not readily apparent. 7. Mild cardiomegaly, but less than in 2021, with small pericardial effusion anteriorly. 8. IVC and hepatic vein reflux which may be seen with right heart dysfunction or tricuspid regurgitation. 9. Severe prostatomegaly including the median lobe, with moderate impression into the bladder. 10. Right inguinal hernia  containing a short loop of the distal small bowel without evidence of hernia strangulation or incarceration. No upstream small bowel obstruction. 11. Small left inguinal fat hernia. 12. Uncomplicated diverticulosis. 13. Aortic atherosclerosis.   Aortic Atherosclerosis (ICD10-I70.0).   MR Brain:  MRA IMPRESSION: 1. No evidence of acute intracranial abnormality. 2. No large vessel occlusion or proximal hemodynamically significant stenosis.   Neurology recommended DAPT x 1 month followed by ASA.     He underwent on 05/08/2024: Dr. Serene  and Dr Sheree Procedure Laterality Anesthesia  REPAIR, ANEURYSM, AORTA, THORACOABDOMINAL Bilateral General  ULTRASOUND GUIDANCE, FOR VASCULAR ACCESS Bilateral General  IVUS (INTRAVASCULAR ULTRASOUND) Right General     [name] [name]  had long hospital stay, please see discharge summary for more details.  [name] [name] was admitted to Inpatient  Rehabilitation on 05/17/2024 and discharged home on 06/09/2024,  He denies any pain. He rates his pain 0. He is receiving Home Health Therapy with [facility].   [name] [name] arrived to office with hypertension, blood pressure was re-checked, he refused ED evaluation. He is compliant with his anti-hypertensive medication he reports.   Pain Inventory Average Pain NO PAIN Pain Right Now NO PAIN My pain is NO PAIN  LOCATION OF PAIN  Left leg weakness, left hip weakness  BOWEL Number of stools per week: Last BM 06/16/2024 Oral laxative use Yes  Type of laxative Miralax  (Sometimes) Enema or suppository use No  History of colostomy No  Incontinent No   BLADDER Pads Bladder incontinence Yes    Mobility walk with assistance use a cane ability to climb steps?  yes do you drive?  no use a wheelchair needs help with transfers Do you have any goals in this area?  yes  Function retired I need assistance with the following:  dressing, bathing, toileting, meal prep, and household duties Do you have any goals in this area?  yes  Neuro/Psych weakness trouble walking  Prior Studies Any changes since last visit?  yes CT/MRI in the [facility]   Physicians involved in your care Any changes since last visit?  no   History reviewed. No pertinent family history. Social History    Socioeconomic History   Marital status: Married    Spouse name: Not on file   Number of children: Not on file   Years of education: Not on file   Highest education level: Not on file  Occupational History   Not on file  Tobacco Use   Smoking status: Former    Current packs/day: 0.25    Average packs/day: 0.3 packs/day for 40.0 years (10.0 ttl pk-yrs)    Types: Cigarettes   Smokeless tobacco: Never   Tobacco comments:    none since 06/24/13  Vaping Use   Vaping status: Never Used  Substance and Sexual Activity   Alcohol use: No   Drug use: No   Sexual activity: Not Currently  Other Topics Concern   Not on file  Social History Narrative   Not on file   Social Drivers of Health   Financial Resource Strain: Not on file  Food Insecurity: No Food Insecurity (05/08/2024)   Hunger Vital Sign    Worried About Running Out of Food in the Last Year: Never true    Ran Out of Food in the Last Year: Never true  Transportation Needs: No Transportation Needs (05/08/2024)   PRAPARE - Administrator, Civil Service (Medical): No    Lack of Transportation (Non-Medical): No  Physical  Activity: Not on file  Stress: Not on file  Social Connections: Moderately Integrated (05/08/2024)   Social Connection and Isolation Panel    Frequency of Communication with Friends and Family: More than three times a week    Frequency of Social Gatherings with Friends and Family: More than three times a week    Attends Religious Services: More than 4 times per year    Active Member of Clubs or Organizations: No    Attends Banker Meetings: Never    Marital Status: Married   Past Surgical History:  Procedure Laterality Date   ANTERIOR LAT LUMBAR FUSION Left 12/15/2014   Procedure: ANTERIOR LATERAL LUMBAR FUSION 1 LEVEL;  Surgeon: Oneil Priestly, MD;  Location: [facility] OR;  Service: Orthopedics;  Laterality: Left;  Left sided lateral lumbar interbody fusion, lumbar 3-4, posterior spinal  fusion, lumbar 3-4 with instrumentation.   COLONOSCOPY     ESOPHAGOGASTRODUODENOSCOPY     fatty tissue removed from stomach     LUMBAR LAMINECTOMY/DECOMPRESSION MICRODISCECTOMY N/A 07/08/2013   Procedure: LUMBAR LAMINECTOMY/DECOMPRESSION MICRODISCECTOMY;  Surgeon: Oneil Rodgers Priestly, MD;  Location: [name] OR;  Service: Orthopedics;  Laterality: N/A;  Lumbar 3-4, lumbar 4-5 decompression   RADIOLOGY WITH ANESTHESIA N/A 10/22/2019   Procedure: MRI WITH ANESTHESIA OF LUMBAR SPINE WITHOUT CONTRAST;  Surgeon: Radiologist, Medication, MD;  Location: [facility] OR;  Service: Radiology;  Laterality: N/A;   right ankle surgery     as child    THORACIC AORTIC ENDOVASCULAR STENT GRAFT N/A 10/15/2019   Procedure: THORACIC AORTIC ENDOVASCULAR STENT GRAFT;  Surgeon: Sheree Penne Bruckner, MD;  Location: [facility] OR;  Service: Vascular;  Laterality: N/A;   THORACOABDOMINAL AORTIC ANEURYSM REPAIR Bilateral 05/08/2024   Procedure: REPAIR, ANEURYSM, AORTA, THORACOABDOMINAL;  Surgeon: Sheree Penne Bruckner, MD;  Location: [facility] OR;  Service: Vascular;  Laterality: Bilateral;   TONSILLECTOMY     as a child   ULTRASOUND GUIDANCE FOR VASCULAR ACCESS Bilateral 05/08/2024   Procedure: ULTRASOUND GUIDANCE, FOR VASCULAR ACCESS;  Surgeon: Sheree Penne Bruckner, MD;  Location: [facility] OR;  Service: Vascular;  Laterality: Bilateral;   Past Medical History:  Diagnosis Date   Chronic back pain    stenosis   Diabetes mellitus without complication (HCC)    Enlarged prostate    GERD (gastroesophageal reflux disease)    occasionally will take a zantac(maybe once a month)   History of colon polyps    History of kidney stones    History of stress test    done 10 yrs. ago, as a baseline    Hyperlipidemia    takes Crestor  daily   Hypertension    takes Amlodipine  daily and Lotensin  as well   Stroke (HCC)    Ht 5' 11 (1.803 m) Comment: w/c  BMI 26.14 kg/m   Opioid Risk Score:   Fall Risk Score:  `1  Depression screen [facility]  2/9     06/22/2024    2:45 PM 11/14/2020   11:51 AM 11/24/2019   11:18 AM  Depression screen PHQ 2/9  Decreased Interest 0 1 2  Down, Depressed, Hopeless 0 3 1  PHQ - 2 Score 0 4 3  Altered sleeping 0 2 0  Tired, decreased energy 0 3 1  Change in appetite 0 2 0  Feeling bad or failure about yourself  0 2 1  Trouble concentrating 0 2 2  Moving slowly or fidgety/restless 0 3 1  Suicidal thoughts 0 0 0  PHQ-9 Score 0 18 8    Review of  Systems  Gastrointestinal:  Positive for constipation.  Musculoskeletal:  Positive for gait problem.  Neurological:  Positive for weakness.  All other systems reviewed and are negative.      Objective:   Physical Exam Vitals and nursing note reviewed.  Constitutional:      Appearance: Normal appearance.  Cardiovascular:     Rate and Rhythm: Normal rate and regular rhythm.  Pulmonary:     Effort: Pulmonary effort is normal.     Breath sounds: Normal breath sounds.  Musculoskeletal:     Comments: Normal Muscle Bulk and Muscle Testing Reveals:  Upper Extremities: Full ROM and Muscle Strength 5/5  Lower Extremities: Full ROM and Muscle Strength 5/5 Arrived in wheelchair      Skin:    General: Skin is warm and dry.  Neurological:     Mental Status: He is alert and oriented to [place], place, and time.  Psychiatric:        Mood and Affect: Mood normal.        Behavior: Behavior normal.          Assessment & Plan:  Cerebral infarction involving right cerebellar artery: He completed 4 week course of DAPT and remains on daily ASA per discharge summary. He has a scheduled Neurology HFU appointment.   History of repair of dissecting thoracic aneurysm :  S/P : on 05/08/2024: Vascular Following  REPAIR, ANEURYSM, AORTA, THORACOABDOMINAL Bilateral General  ULTRASOUND GUIDANCE, FOR VASCULAR ACCESS Bilateral General  IVUS (INTRAVASCULAR ULTRASOUND)    Essential Hypertension: Continue current medication: PCP following. Continue to Monitor.  F/U  with Dr Carilyn in 4- 6 weeks

## 2024-07-15 ENCOUNTER — Ambulatory Visit: Attending: Vascular Surgery | Admitting: Vascular Surgery

## 2024-07-15 ENCOUNTER — Encounter: Payer: Self-pay | Admitting: Vascular Surgery

## 2024-07-15 VITALS — BP 134/78 | HR 59 | Temp 98.3°F

## 2024-07-15 DIAGNOSIS — I7162 Paravisceral aneurysm of the thoracoabdominal aorta, without rupture: Secondary | ICD-10-CM

## 2024-07-15 NOTE — Progress Notes (Signed)
     Subjective:     Patient ID: [name] [name], male   DOB: Jul 22, 1946, 78 y.o.   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  He underwent CTA last month and was found to have pulmonary embolus without any symptoms of shortness of breath.  He continues to improve return to his baseline status with some mild residual weakness and slowed speech.  He is taking Eliquis  without incidence.   Review of Systems As above    Objective:   Physical Exam Vitals:   07/15/24 1431  BP: 134/78  Pulse: (!) 59  Temp: 98.3 F (36.8 C)   Awake alert and oriented Nonlabored respirations Left infraclavicular incision healing well Palpable left radial pulse and bilateral common femoral pulses   CT IMPRESSION: 1. Large filling defect in middle and lower lobe branches of the right pulmonary artery consistent with pulmonary embolus. This finding was discussed with Dr. Sheree at 3:07 pm on 06/19/2024. 2. 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 3. 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 4. 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 5. Moderate-sized right inguinal hernia containing loops of small bowel without obstruction. 6. Severely enlarged prostate gland with lobular extension into the urinary bladder.    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  Follow-up CTA demonstrated pulmonary embolus he is now taking Eliquis .  Plan will be for follow-up in 5 months with repeat CTA for further evaluation and we can discuss the need for  continued Eliquis  versus discontinuation at that time.  All questions were answered they demonstrate good understanding.         Caliegh Middlekauff C. Sheree, MD [facility] Office: [phone] Pager: [phone]

## 2024-07-21 ENCOUNTER — Other Ambulatory Visit: Payer: Self-pay | Admitting: Vascular Surgery

## 2024-07-21 ENCOUNTER — Encounter: Payer: Self-pay | Admitting: Podiatry

## 2024-07-21 ENCOUNTER — Ambulatory Visit: Admitting: Podiatry

## 2024-07-21 DIAGNOSIS — N183 Chronic kidney disease, stage 3 unspecified: Secondary | ICD-10-CM

## 2024-07-21 DIAGNOSIS — M79675 Pain in left toe(s): Secondary | ICD-10-CM

## 2024-07-21 DIAGNOSIS — B351 Tinea unguium: Secondary | ICD-10-CM | POA: Diagnosis not present

## 2024-07-21 DIAGNOSIS — E1140 Type 2 diabetes mellitus with diabetic neuropathy, unspecified: Secondary | ICD-10-CM

## 2024-07-21 DIAGNOSIS — M79674 Pain in right toe(s): Secondary | ICD-10-CM

## 2024-07-21 MED ORDER — APIXABAN 5 MG PO TABS: 5.0000 mg | ORAL_TABLET | Freq: Two times a day (BID) | ORAL | 4 refills | Status: AC

## 2024-07-21 NOTE — Progress Notes (Signed)
 This patient returns to my office for at risk foot care.  This patient requires this care by a professional since this patient will be at risk due to having acute kidney disease ,DM and CKD. He presents to the office with his wife.  This patient is unable to cut nails himself since the patient cannot reach his nails.These nails are painful walking and wearing shoes.  This patient presents for at risk foot care today.  General Appearance  Alert, conversant and in no acute stress.  Vascular  Dorsalis pedis and posterior tibial  pulses are palpable  bilaterally.  Capillary return is within normal limits  bilaterally. Temperature is within normal limits  bilaterally.  Neurologic  Senn-Weinstein monofilament wire test diminished   bilaterally. Muscle power within normal limits bilaterally.  Nails Thick disfigured discolored nails with subungual debris  from hallux to fifth toes bilaterally. No evidence of bacterial infection or drainage bilaterally.   Orthopedic  No limitations of motion  feet .  No crepitus or effusions noted.  HAV  B/L.  Hammer toes 2-5  B/L.  Skin  normotropic skin with no porokeratosis noted bilaterally.  No signs of infections or ulcers noted.     Onychomycosis  Pain in right toes  Pain in left toes  Consent was obtained for treatment procedures.   Mechanical debridement of nails 1-5  bilaterally performed with a nail nipper.  Filed with dremel without incident.     Return office visit   12 weeks                   Told patient to return for periodic foot care and evaluation due to potential at risk complications.   Cordella Bold DPM

## 2024-07-28 ENCOUNTER — Encounter: Payer: Self-pay | Admitting: Neurology

## 2024-07-28 ENCOUNTER — Ambulatory Visit: Admitting: Neurology

## 2024-07-28 VITALS — BP 139/73 | HR 55 | Ht 71.0 in | Wt 197.4 lb

## 2024-07-28 DIAGNOSIS — Z8673 Personal history of transient ischemic attack (TIA), and cerebral infarction without residual deficits: Secondary | ICD-10-CM

## 2024-07-28 DIAGNOSIS — R269 Unspecified abnormalities of gait and mobility: Secondary | ICD-10-CM

## 2024-07-28 DIAGNOSIS — F0150 Vascular dementia without behavioral disturbance: Secondary | ICD-10-CM

## 2024-07-28 NOTE — Progress Notes (Signed)
 [facility] [address] [place]. [place] [address] [phone]       OFFICE FOLLOW-UP NOTE  [name] [name] Date of Birth:  [birth year] Medical Record Number:  [id number]    Chief Complaint  Patient presents with   RM17/STROKE    Pt is here with his Wife. Pt states that things have been going pretty good since his stroke.       HPI:  Update 07/28/2024 : Patient is seen for follow-up today after his last visit with [facility] nurse practitioner 2 years ago.  He is accompanied by his wife who provides most of the history.  Patient was admitted in September 2025 for treatment for thoracic aortic aneurysm repair.  Following surgery he was noticed to be dragging his left leg and his walking was felt to be the usual.  MRI scan of the brain was obtained which demonstrated a small right cerebellar deep white matter infarct.  Stroke etiology was felt to be small vessel disease.  MRA of the head and neck showed a symmetric attenuation of the proximal right common carotid artery and CT angiogram was obtained which showed only mild abnormality changes at carotid bifurcations and siphons bilaterally without significant stenosis.  Lower extremity venous Doppler showed age-indeterminate DVT involving left common femoral, left popliteal vein left posterior tibial and peroneal veins.  Transthoracic echo showed ejection fraction of 6065%.  LDL cholesterol 61 mg percent.  Hemoglobin A1c was 5.8.  Patient was on aspirin  prior to admission and was switched to Eliquis  because of the DVT.  For inpatient rehab and did well and is currently living at home.  She is able to ambulate with a walker but his wife feels that his walking is not yet back to his baseline.  He is getting home physical therapy 1 days a week.  He still had a few minor falls but fortunately no major injuries since being discharged home.  Memory and cognitive difficulties which provide feels may not have progressed significantly  however on Mini-Mental status testing today scored 23/30 significant decline from 29 from 2 years ago.  Patient remains on Namenda  10 mg twice daily tolerating well without side effects.  He is mostly independent in his self-care needs but needs help with his medications and finances.  He cannot be left alone.  His wife has very little help at home. Update 07/19/2022 JM: Patient returns for yearly stroke follow-up accompanied by his wife.  Overall stable without new stroke/TIA symptoms. Reports cognition has improved since prior visit MMSE today 29/30 (prior 23/20) Has remained on Namenda  10 mg twice daily, also reports taking Prevagen extra strength  Compliant on aspirin  and Crestor  Blood pressure well controlled Routinely follows with PCP Dr. Ransom      History provided for reference purposes only Update 07/19/2021 JM: returns for 6 month stroke follow up accompanied by his wife.  Overall stable -denies new stroke/TIA symptoms Reports cognition has been stable with some improvement noted - MMSE today 23/30 (prior 16/30) Started on Namenda  10mg  twice daily after prior visit - denies side effects  Does report double vision since his stroke which has not worsened - was seen by his ophthalmology without any concerning findings - initially after wearing stroke was wearing eye patch which would help on occasion   Compliant on aspirin  and Crestor  -denies side effects Blood pressure today 133/80 - monitors at home which has been stable  No new concerns at this time  Update 01/11/2021 JM: [name] [name]  returns for follow-up after being seen 3 months ago with concerns of cognitive decline and behavioral changes as well as prior stroke history.  Work-up for underlying causes unremarkable including MRI and lab work.  EEG not yet completed. Per wife, improvement of mood and currently at baseline.  Cognition has been stable without worsening although MMSE today 16/30 (prior 19/30).  He questions  possibility of returning to driving.  He admits to remaining sedentary with limited to no physical activity and no mental stimulation.  No further behavioral concerns.  History of stroke without new stroke/TIA symptoms.  Compliant on aspirin  and Crestor  without associated side effects.  Recent lipid panel and A1c satisfactory.  Blood pressure today 142/93 on multiple antihypertensives.  Update 11/14/2020 JM: [name] [name] is being seen for acute visit by request of wife due to worsening cognition and behavioral changes.  Majority of history and information obtained by wife.  Cognition has been slowly declining since prior visit approximately 4 months ago.  Denies any recent or acute changes He has been having difficulty with short-term memory, processing of information and confusion which can worsen in the evening time.  Becomes agitated quickly and will curse which is not like him per wife.  Denies aggressiveness, hallucinations, paranoia or delusions Sleeps well at night although frequent nocturia.  Denies snoring or daytime fatigue Admits to sedentary lifestyle with no routine physical activity but will accompany wife to visits with family, grocery store, going out for breakfast and doctor's appointments Previously working with SLP but since completed and does not do exercises as recommended or any type of memory exercises Able to maintain ADLs with only some assistance by wife due to balanice difficulties. He does not do any type of cleaning, cooking or bill paying.  He does not drive.  Wife is his primary caregiver and provides 24/7 supervision  Compliant on aspirin  and Crestor  -denies associated side effects.  No recent lipid panel Blood pressure today 163/83 on carvedilol , amlodipine , and hydralazineper PCP.  Similar reading on recheck  No further concerns at this time  Update 07/11/2020 JM: [name] [name] is being seen for 86-month stroke follow-up accompanied by his wife.  Reports residual  occasional imbalance and cognitive impairment with improvement since prior visit.  He has since completed therapies but continues to do exercises at home as recommended.  He questions potential return to driving.  Denies new stroke/TIA symptoms.  Remains on aspirin  and Crestor  for secondary stroke prevention of side effects.  Blood pressure today 176/98 and similar on recheck.   Occasionally monitors at home typically SBP 150s per wife.  No further concerns at this time.  Initial visit 01/07/2020 Dr. Rosemarie: Mr. Datta is a 78 year old pleasant African-American male seen today for initial office follow-up visit following hospital consultation for stroke in February 2021.  Is accompanied by his wife.  History is obtained from them, review of electronic medical records and I personally reviewed imaging films in PACS.  He has past medical history of hyperlipidemia, hypertension, presented to [facility] with back pain and epigastric pain on 10/08/2019 and was found to have distal thoracic aortic aneurysm and small bilateral pulmonary embolisms.  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  He developed altered mental status and weakness in 10/17/2019 and CT scan showed hypodensities in right cerebellum and right occipital lobe suspicious for infarcts.  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  MRA of the head showed chronic moderate left P1 stenosis.  Carotid Dopplers were unremarkable.  2D echo showed normal ejection fraction.  LDL cholesterol was 72 mg percent hemoglobin A1c was 6.7.  Patient was initially started on IV heparin  and was planned for being discharged on Eliquis  and went to inpatient rehab.  However his medication list today states that he is on aspirin  81 mg only and patient is unable to tell me the reason for this.  He is currently getting home physical and occupational therapy which she has just finished but outpatient therapy has not yet  started.  He walks with a wheeled walker but feels his balance is off and tends to fall backwards and has poor truncal balance.  He can walk with a walker short distances well.  He has had no falls or injuries.  He just had follow-up visit with Dr. Carilyn today.  He does complain of mild memory difficulties and cognitive impairment since his stroke.  This is not progressive.   ROS:   14 system review of systems is positive for those listed in HPI and all other systems negative PMH:  Past Medical History:  Diagnosis Date   Chronic back pain    stenosis   Diabetes mellitus without complication (HCC)    Enlarged prostate    GERD (gastroesophageal reflux disease)    occasionally will take a zantac(maybe once a month)   History of colon polyps    History of kidney stones    History of stress test    done 10 yrs. ago, as a baseline    Hyperlipidemia    takes Crestor  daily   Hypertension    takes Amlodipine  daily and Lotensin  as well   Stroke [facility])     Social History:  Social History   Socioeconomic History   Marital status: Married    Spouse name: Not on file   Number of children: Not on file   Years of education: Not on file   Highest education level: Not on file  Occupational History   Not on file  Tobacco Use   Smoking status: Former    Current packs/day: 0.25    Average packs/day: 0.3 packs/day for 40.0 years (10.0 ttl pk-yrs)    Types: Cigarettes   Smokeless tobacco: Never   Tobacco comments:    none since 06/24/13  Vaping Use   Vaping status: Never Used  Substance and Sexual Activity   Alcohol use: No   Drug use: No   Sexual activity: Not Currently  Other Topics Concern   Not on file  Social History Narrative   Not on file   Social Drivers of Health   Financial Resource Strain: Not on file  Food Insecurity: No Food Insecurity (05/08/2024)   Hunger Vital Sign    Worried About Running Out of Food in the Last Year: Never true    Ran Out of Food in the Last  Year: Never true  Transportation Needs: No Transportation Needs (05/08/2024)   PRAPARE - Administrator, Civil Service (Medical): No    Lack of Transportation (Non-Medical): No  Physical Activity: Not on file  Stress: Not on file  Social Connections: Moderately Integrated (05/08/2024)   Social Connection and Isolation Panel    Frequency of Communication with Friends and Family: More than three times a week    Frequency of Social Gatherings with Friends and Family: More than three times a week    Attends Religious Services: More than 4 times per year    Active Member of [employer]  or Organizations: No    Attends Banker Meetings: Never    Marital Status: Married  Catering Manager Violence: Not At Risk (05/08/2024)   Humiliation, Afraid, Rape, and Kick questionnaire    Fear of Current or Ex-Partner: No    Emotionally Abused: No    Physically Abused: No    Sexually Abused: No    Medications:   Current Outpatient Medications on File Prior to Visit  Medication Sig Dispense Refill   acetaminophen  (TYLENOL ) 500 MG tablet Take 500 mg by mouth 2 (two) times daily.     amLODipine  (NORVASC ) 10 MG tablet Take 1 tablet (10 mg total) by mouth daily with supper. 30 tablet 0   apixaban  (ELIQUIS ) 5 MG TABS tablet Take 1 tablet (5 mg total) by mouth 2 (two) times daily. 60 tablet 4   aspirin  EC 81 MG tablet Take 1 tablet (81 mg total) by mouth daily. 100 tablet 0   carvedilol  (COREG ) 25 MG tablet Take 25 mg by mouth 2 (two) times daily.     losartan (COZAAR) 50 MG tablet Take 50 mg by mouth daily.     melatonin 5 MG TABS Take 1 tablet (5 mg total) by mouth at bedtime as needed. 30 tablet 0   memantine  (NAMENDA ) 10 MG tablet TAKE 1 TABLET BY MOUTH TWICE DAILY 60 tablet 10   Multiple Vitamins-Minerals (MENS 50+ MULTIVITAMIN) TABS Take 1 tablet by mouth daily.     pantoprazole  (PROTONIX ) 40 MG tablet Take 1 tablet (40 mg total) by mouth daily. 30 tablet 0   polyethylene glycol powder  (GLYCOLAX /MIRALAX ) 17 GM/SCOOP powder Dissolve 2 capfuls (34 g) in 4-8 ounces of liquid and take by mouth daily. 238 g 0   rosuvastatin  (CRESTOR ) 10 MG tablet Take 1 tablet (10 mg total) by mouth at bedtime. 30 tablet 0   tamsulosin  (FLOMAX ) 0.4 MG CAPS capsule Take 1 capsule (0.4 mg total) by mouth daily after supper. 30 capsule 0   No current facility-administered medications on file prior to visit.    Allergies:   Allergies  Allergen Reactions   Lyrica [Pregabalin] Nausea Only and Other (See Comments)    Hallucinations Dizziness    Sulfa Antibiotics Other (See Comments)    Unknown reaction    Physical Exam Today's Vitals   07/28/24 1602  BP: 139/73  Pulse: (!) 55  SpO2: 99%  Weight: 197 lb 6.4 oz (89.5 kg)  Height: 5' 11 (1.803 m)     Body mass index is 27.53 kg/m.   General: well developed, well nourished very pleasant elderly African-American male, seated, in no evident distress Head: head normocephalic and atraumatic.  Neck: supple with no carotid or supraclavicular bruits Cardiovascular: regular rate and rhythm, no murmurs Musculoskeletal: no deformity Skin:  no rash/petichiae Vascular:  Normal pulses all extremities  Neurologic Exam Mental Status: Awake and fully alert. Slowed hesitant speech but no evidence of aphasia or dysarthria.  Oriented to place and time. Recent memory impaired and remote memory intact. attention span, concentration and fund of knowledge mildly diminished,. Mood and affect appropriate.  MMSE 23/30.  Geriatric depression scale not depressed.  Functional activity?:  Team of moderate appointment.  Clock drawing 4/4.  Able to name 9 animals that can walk on 4 legs.    07/28/2024    4:09 PM 07/19/2022    3:09 PM 07/19/2021   12:44 PM  MMSE - Mini Mental State Exam  Orientation to time 4 5 4   Orientation to Place 5 5  5  Registration 3 3 3   Attention/ Calculation 1 5 2   Recall 2 2 1   Language- name 2 objects 2 2 2   Language- repeat 0 1 1   Language- follow 3 step command 3 3 3   Language- read & follow direction 1 1 1   Write a sentence 1 1 1   Copy design 1 1 0  Total score 23 29 23    Cranial Nerves: Pupils equal, briskly reactive to light. Extraocular movements full without nystagmus but evidence of OS misalignment after convergence testing. Visual fields full to confrontation. Hearing intact. Facial sensation intact. Face, tongue, palate moves normally and symmetrically.  Motor: Normal bulk and tone. Normal strength in all tested extremity muscles. Sensory.: intact to touch ,pinprick .position and vibratory sensation.  Coordination: Rapid alternating movements normal in all extremities. Finger-to-nose and heel-to-shin performed accurately bilaterally. Gait and Station: Arises from chair without difficulty. Stance is broad-based gait demonstrates mild ataxia and mild imbalance and drags left leg.  Uses a walker.    reflexes: 1+ and symmetric. Toes downgoing.       ASSESSMENT/PLAN: 78 year old African-American male with multiple by cerebral infarcts in February 2021 s/p aortic graft surgery for aortic dissection with residual mild cognitive, diplopia and gait impairment.  History of recent small right cerebellar infarct following aortic surgery in September 2025 with mild worsening of vascular dementia and gait difficulty    I had a long d/w patient and his wife about his recent stroke, vascular dementia, risk for recurrent stroke/TIAs, personally independently reviewed imaging studies and stroke evaluation results and answered questions.Continue Eliquis  5 mg twice daily for secondary stroke prevention and maintain strict control of hypertension with blood pressure goal below 130/90, diabetes with hemoglobin A1c goal below 6.5% and lipids with LDL cholesterol goal below 70 mg/dL. I also advised the patient to eat a healthy diet with plenty of whole grains, cereals, fruits and vegetables, exercise regularly and maintain ideal body  weight .I encouraged him to use his walker at all times and is discussed fall safety precautions.  Continue home physical therapy for gait and balance.  Continue Namenda  for his cognitive impairment and increase participation in cognitively challenging activities like solving crossword puzzles, playing bridge and sudoku.  We also discussed memory compensation strategies.  Followup in the future with my nurse practitioner in 6 months or call earlier if necessary.d answered all the questions to patient's satisfaction   I personally spent a total of 40 minutes in the care of the patient today including getting/reviewing separately obtained history, performing a medically appropriate exam/evaluation, counseling and educating, placing orders, referring and communicating with other health care professionals, documenting clinical information in the EHR, independently interpreting results, and coordinating care.        Eather Popp, MD  [facility] [address] [place], [place] [address]    Phone [phone] Fax [phone] Note: This document was prepared with digital dictation and possible smart phrase technology. Any transcriptional errors that result from this process are unintentional.

## 2024-07-28 NOTE — Patient Instructions (Addendum)
 I had a long d/w patient and his wife about his recent stroke, vascular dementia, risk for recurrent stroke/TIAs, personally independently reviewed imaging studies and stroke evaluation results and answered questions.Continue Eliquis  5 mg twice daily for secondary stroke prevention and maintain strict control of hypertension with blood pressure goal below 130/90, diabetes with hemoglobin A1c goal below 6.5% and lipids with LDL cholesterol goal below 70 mg/dL. I also advised the patient to eat a healthy diet with plenty of whole grains, cereals, fruits and vegetables, exercise regularly and maintain ideal body weight .I encouraged him to use his walker at all times and is discussed fall safety precautions.  Continue home physical therapy for gait and balance.  Continue Namenda  for his cognitive impairment and increase participation in cognitively challenging activities like solving crossword puzzles, playing bridge and sudoku.  We also discussed memory compensation strategies.  Followup in the future with my nurse practitioner in 6 months or call earlier if necessary.  Memory Compensation Strategies  Use WARM strategy.  W= write it down  A= associate it  R= repeat it  M= make a mental note  2.   You can keep a Glass Blower/designer.  Use a 3-ring notebook with sections for the following: calendar, important names and phone numbers,  medications, doctors' names/phone numbers, lists/reminders, and a section to journal what you did  each day.   3.    Use a calendar to write appointments down.  4.    Write yourself a schedule for the day.  This can be placed on the calendar or in a separate section of the Memory Notebook.  Keeping a  regular schedule can help memory.  5.    Use medication organizer with sections for each day or morning/evening pills.  You may need help loading it  6.    Keep a basket, or pegboard by the door.  Place items that you need to take out with you in the basket or on the  pegboard.  You may also want to  include a message board for reminders.  7.    Use sticky notes.  Place sticky notes with reminders in a place where the task is performed.  For example:  turn off the  stove placed by the stove, lock the door placed on the door at eye level,  take your medications on  the bathroom mirror or by the place where you normally take your medications.  8.    Use alarms/timers.  Use while cooking to remind yourself to check on food or as a reminder to take your medicine, or as a  reminder to make a call, or as a reminder to perform another task, etc.

## 2024-07-30 ENCOUNTER — Encounter: Admitting: Physical Medicine & Rehabilitation

## 2024-07-30 ENCOUNTER — Encounter: Payer: Self-pay | Admitting: Physical Medicine & Rehabilitation

## 2024-07-30 VITALS — BP 150/81 | HR 54 | Ht 71.0 in | Wt 200.0 lb

## 2024-07-30 DIAGNOSIS — I63541 Cerebral infarction due to unspecified occlusion or stenosis of right cerebellar artery: Secondary | ICD-10-CM | POA: Diagnosis not present
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
## 2024-07-30 NOTE — Patient Instructions (Addendum)
°  ° ° ° °  VISIT SUMMARY: You came in for a follow-up visit after your recent hospitalization. We discussed your balance issues, recent falls, and ongoing back pain. You are currently receiving home health therapy and using a walker for mobility.  YOUR PLAN: FUNCTIONAL DEFICITS SECONDARY TO RIGHT CEREBELLAR INFARCT: You have ongoing balance issues and increased weakness on your left side due to a previous brain injury and spine problems. -Continue with your home health therapy, including physical and speech therapy. -We will arrange for outpatient physical therapy for your left-sided weakness after your home health therapy ends. -You will have outpatient therapy sessions twice a week for 1-2 months. -We will coordinate with [facility] to transition from home health therapy to outpatient therapy. -Continue with your speech therapy.                       Contains text generated by Abridge.                                 Contains text generated by Abridge.                     Contains text generated by Abridge.                                 Contains text generated by Abridge.

## 2024-08-24 ENCOUNTER — Ambulatory Visit: Attending: Physical Medicine & Rehabilitation | Admitting: Physical Therapy

## 2024-08-24 VITALS — BP 150/70 | HR 53

## 2024-08-24 DIAGNOSIS — Z9181 History of falling: Secondary | ICD-10-CM | POA: Diagnosis present

## 2024-08-24 DIAGNOSIS — R2689 Other abnormalities of gait and mobility: Secondary | ICD-10-CM | POA: Insufficient documentation

## 2024-08-24 DIAGNOSIS — I63541 Cerebral infarction due to unspecified occlusion or stenosis of right cerebellar artery: Secondary | ICD-10-CM | POA: Insufficient documentation

## 2024-08-24 DIAGNOSIS — R2681 Unsteadiness on feet: Secondary | ICD-10-CM | POA: Diagnosis present

## 2024-08-24 DIAGNOSIS — M6281 Muscle weakness (generalized): Secondary | ICD-10-CM | POA: Insufficient documentation

## 2024-08-24 DIAGNOSIS — R41840 Attention and concentration deficit: Secondary | ICD-10-CM | POA: Insufficient documentation

## 2024-08-24 DIAGNOSIS — R41841 Cognitive communication deficit: Secondary | ICD-10-CM | POA: Diagnosis present

## 2024-08-24 NOTE — Therapy (Signed)
 " OUTPATIENT PHYSICAL THERAPY NEURO EVALUATION   Patient Name: [name] [name] MRN: [id number] DOB:Feb 15, 1946, 79 y.o., male Today's Date: 08/24/2024   PCP: [name] Other, MD REFERRING PROVIDER: Carilyn Prentice BRAVO, MD  END OF SESSION:  PT End of Session - 08/24/24 1444     Visit Number 1    Number of Visits 17    Date for Recertification  11/02/24    Authorization Type [employer] Medicare    PT Start Time 1443    PT Stop Time 1531    PT Time Calculation (min) 48 min    Activity Tolerance Patient tolerated treatment well    Behavior During Therapy WFL for tasks assessed/performed          Past Medical History:  Diagnosis Date   Chronic back pain    stenosis   Diabetes mellitus without complication (HCC)    Enlarged prostate    GERD (gastroesophageal reflux disease)    occasionally will take a zantac(maybe once a month)   History of colon polyps    History of kidney stones    History of stress test    done 10 yrs. ago, as a baseline    Hyperlipidemia    takes Crestor  daily   Hypertension    takes Amlodipine  daily and Lotensin  as well   Stroke [facility])    Past Surgical History:  Procedure Laterality Date   ANTERIOR LAT LUMBAR FUSION Left 12/15/2014   Procedure: ANTERIOR LATERAL LUMBAR FUSION 1 LEVEL;  Surgeon: [name] Priestly, MD;  Location: [facility] OR;  Service: Orthopedics;  Laterality: Left;  Left sided lateral lumbar interbody fusion, lumbar 3-4, posterior spinal fusion, lumbar 3-4 with instrumentation.   COLONOSCOPY     ESOPHAGOGASTRODUODENOSCOPY     fatty tissue removed from stomach     LUMBAR LAMINECTOMY/DECOMPRESSION MICRODISCECTOMY N/A 07/08/2013   Procedure: LUMBAR LAMINECTOMY/DECOMPRESSION MICRODISCECTOMY;  Surgeon: [name] Rodgers Priestly, MD;  Location: [facility] OR;  Service: Orthopedics;  Laterality: N/A;  Lumbar 3-4, lumbar 4-5 decompression   RADIOLOGY WITH ANESTHESIA N/A 10/22/2019   Procedure: MRI WITH ANESTHESIA OF LUMBAR SPINE WITHOUT CONTRAST;  Surgeon: Radiologist,  Medication, MD;  Location: [facility] OR;  Service: Radiology;  Laterality: N/A;   right ankle surgery     as child    THORACIC AORTIC ENDOVASCULAR STENT GRAFT N/A 10/15/2019   Procedure: THORACIC AORTIC ENDOVASCULAR STENT GRAFT;  Surgeon: [name] Penne Bruckner, MD;  Location: [name] OR;  Service: Vascular;  Laterality: N/A;   THORACOABDOMINAL AORTIC ANEURYSM REPAIR Bilateral 05/08/2024   Procedure: REPAIR, ANEURYSM, AORTA, THORACOABDOMINAL;  Surgeon: [name] Penne Bruckner, MD;  Location: [facility] OR;  Service: Vascular;  Laterality: Bilateral;   TONSILLECTOMY     as a child   ULTRASOUND GUIDANCE FOR VASCULAR ACCESS Bilateral 05/08/2024   Procedure: ULTRASOUND GUIDANCE, FOR VASCULAR ACCESS;  Surgeon: [name] Penne Bruckner, MD;  Location: [facility] OR;  Service: Vascular;  Laterality: Bilateral;   Patient Active Problem List   Diagnosis Date Noted   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) 05/23/2024   Stroke (cerebrum) (HCC) 05/17/2024   Status post abdominal aortic aneurysm (AAA) repair 05/08/2024   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   Acute encephalopathy 03/01/2024   Allergic rhinitis 09/21/2020   Benign prostatic hyperplasia 09/21/2020   Body mass index (BMI) 31.0-31.9, adult 09/21/2020   Cerebral infarction (HCC) 09/21/2020   Diabetic foot (HCC) 09/21/2020   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   Dysarthria 09/21/2020   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   Gait difficulty 09/21/2020   History  of psychiatric disorder 09/21/2020   History of colonic polyps 09/21/2020   Pure hypercholesterolemia 09/21/2020   Unspecified thoracic, thoracolumbar and lumbosacral intervertebral disc disorder 09/21/2020   Hypertension    Hyperlipidemia    GERD (gastroesophageal reflux disease)    DM (diabetes mellitus) (HCC)    CKD (chronic kidney disease), stage III (HCC)    Normocytic anemia    Thrombocytopenia    UTI (urinary tract infection)    AMS (altered mental  status)    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   History of repair of dissecting thoracic aneurysm    Abdominal distension    Pulmonary embolus (HCC) 10/19/2019   CVA (cerebral vascular accident) (HCC) 10/19/2019   AKI (acute kidney injury) 10/19/2019   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) 10/19/2019   Ileus (HCC) 10/19/2019   Dissection of thoracoabdominal aorta (HCC)    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   Radiculopathy 12/15/2014    ONSET DATE: 07/30/2024 (referral)   REFERRING DIAG: P36.458 (ICD-10-CM) - 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 Wife provides supplementation for subjective. Wife reports pt has had 3 falls since leaving [facility]. Has carpet burn on his knees that his PCP is aware of from these. [name] [name] once in the closet, once in the office (bending forward to unplug computer) and third fall occurred when pt tried to take trash out. Pt does not remember these, has impaired short term memory. Was not using RW during all 3 falls. Pt uses WC all day, everyday. Per wife, [name] [name] therapist wanted pt to use the RW during the day, but pt does not do this.   Pt has double vision, has glasses for this but forgot them today. Used to be a technical sales engineer, played health visitor. Has not played in a while but is interested in trying again.    Pt accompanied by: [name] [name]    PERTINENT HISTORY: CVA (1st one in 2020), HTN, HLD, GERD, and diabetes. admitted to rehab 05/17/2024 following aneurysm repair   PAIN:  Are you having pain? No  PRECAUTIONS: Fall  RED  FLAGS: None   WEIGHT BEARING RESTRICTIONS: No  FALLS: Has patient fallen in last 6 months? Yes. Number of falls 3  LIVING ENVIRONMENT: Lives with: lives with their spouse Lives in: House/apartment Stairs: Yes: Internal: 14 steps; on right going up and External: 1 in back steps; none Has following equipment at home: Vannie - 2 wheeled, Environmental Consultant - 4 wheeled, Wheelchair (manual), shower chair, bed side commode, and Grab bars  PLOF: Independent, Leisure: Publishing copy and piano, and Pt was not driving  PATIENT GOALS: I want to be able to make love again   OBJECTIVE:  Note: Objective measures were completed at Evaluation unless otherwise noted.  DIAGNOSTIC FINDINGS: CTA of chest/abd/pelv from 06/19/24 IMPRESSION: 1. Large filling defect in middle and lower lobe branches of the right pulmonary artery consistent with pulmonary embolus. This finding was discussed with Dr. Sheree at 3:07 pm on 06/19/2024. 2. 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 3. 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 4. 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 5. Moderate-sized right inguinal hernia containing loops of small bowel without obstruction. 6. Severely enlarged prostate gland with lobular extension into the urinary bladder.  CT of head from 05/16/24 IMPRESSION: CT HEAD:   1. Grossly stable appearance of previously identified small acute ischemic nonhemorrhagic right cerebellar infarct. 2. Age-related cerebral atrophy with mild chronic small vessel ischemic  disease, with a few small remote occipital infarcts   CTA HEAD AND NECK:   1. Negative CTA for large vessel occlusion or [name] emergent finding. 2. Mild atheromatous change about the carotid bifurcations and carotid siphons without hemodynamically significant stenosis. 3. Diffuse tortuosity and ectasia of the major arterial vasculature of the head and neck, suggesting chronic underlying hypertension. 4. Previously noted attenuation of the proximal right CCA on prior MRA was consistent with artifact. No stenosis in this region.   MRI of brain from 05/14/24 IMPRESSION: 1. 9 mm acute ischemic nonhemorrhagic right cerebellar infarct. 2. Underlying age-related cerebral atrophy with moderate chronic microvascular ischemic disease, with a few scattered remote lacunar infarcts about the periventricular white matter and corpus callosum. 3. Few small remote cortical to subcortical bilateral occipital infarcts.  MRA of head/neck from 05/15/24 IMPRESSION: MRA HEAD:   Negative intracranial MRA. No large vessel occlusion or hemodynamically significant stenosis. No aneurysm.   MRA NECK:   1. Somewhat technically limited exam due to motion and lack of IV contrast. 2. Asymmetric attenuation of the proximal right CCA. While this is suspected to be artifactual in nature as the vessel appears tortuous and ectatic at this level, a possible underlying stenosis is difficult to exclude given the technical limitations of this exam. Correlation with dedicated CTA could be performed for further evaluation as warranted. 3. Atheromatous change about the right carotid bulb without hemodynamically significant greater than 50% stenosis. 4. Wide patency of the left carotid artery system within the neck. 5. Wide patency of both vertebral arteries within the neck. Left vertebral artery dominant.  COGNITION: Overall cognitive status: Impaired: Areas of impairment:  Attention: Impaired: Selective,  Alternating Memory: Impaired: Immediate Working It Consultant function: Impaired: Problem solving, Planning, Self-correction, and Slow processing    SENSATION: Pt reports bilateral numbness in feet    EDEMA: Chronic BLE edema    MUSCLE TONE: LLE: Hypotonic   POSTURE: rounded  shoulders, forward head, increased thoracic kyphosis, and right pelvic obliquity  LOWER EXTREMITY ROM:    /  Active  Right Eval Left Eval  Hip flexion    Hip extension    Hip abduction    Hip adduction    Hip internal rotation    Hip external rotation    Knee flexion    Knee extension    Ankle dorsiflexion    Ankle plantarflexion    Ankle inversion    Ankle eversion     (Blank rows = not tested)  LOWER EXTREMITY MMT:  tested in seated position   MMT Right Eval Left Eval  Hip flexion 4- 3+  Hip extension    Hip abduction 4 4-  Hip adduction 4 3+  Hip internal rotation    Hip external rotation    Knee flexion 4- 3+  Knee extension 4 4  Ankle dorsiflexion 4 4-  Ankle plantarflexion    Ankle inversion    Ankle eversion    (Blank rows = not tested)  BED MOBILITY:  Not tested Independent per pt   TRANSFERS: Sit to stand: SBA  Assistive device utilized: Environmental Consultant - 2 wheeled     Stand to sit: SBA  Assistive device utilized: Environmental Consultant - 2 wheeled     Heavy reliance on BUE support w/poor eccentric control and decreased anterior weight shift   RAMP:  Not tested  CURB:  Not tested  STAIRS: Not tested GAIT: Gait pattern: step through pattern, decreased stride length, decreased ankle dorsiflexion- Right, decreased ankle dorsiflexion- Left, knee flexed in stance- Right, knee flexed in stance- Left, shuffling, ataxic, trendelenburg, lateral hip instability, trunk flexed, and narrow BOS Distance walked: Various clinic distances  Assistive device utilized: Environmental Consultant - 2 wheeled Level of assistance: SBA Comments: Pt w/inconsistent foot placement of LLE, significant Trendelenburg of LLE  and maintained knee flexion bilaterally.    FUNCTIONAL TESTS:   [facility] PT Assessment - 08/24/24 1517       Balance   Balance Assessed Yes      Standardized Balance Assessment   Standardized Balance Assessment 10 meter walk test;Five Times Sit to Stand    Five times sit to stand comments  41.40s w/BUE support   RLE posterior, decreased anterior weight shift, heavy reliance on BUE support   10 Meter Walk 0.4 m/s w/RW and SBA   31m over 24.78s w/RW          VITALS  Vitals:   08/24/24 1503  BP: (!) 150/70  Pulse: (!) 53                                                                                                                                 TREATMENT:   Self-care/home management  Assessed vitals in LUE (see above) and WNL for pt and session  Lengthy discussion regarding importance of walking at home (with RW at all times) for improved functional BLE strength, endurance  and reduced fall risk.  Encouraged pt to perform sit to stands at home without use of lift chair, as pt relies heavily on lift chair because it is easy despite being able to stand independently.  Discussed plan to focus on gait, stairs and floor transfers in PT.    PATIENT EDUCATION: Education details: PT POC, eval findings, see self-care above  [place] educated: Patient and Spouse Education method: Medical Illustrator Education comprehension: verbalized understanding, returned demonstration, and needs further education  HOME EXERCISE PROGRAM: To be established   GOALS: Goals reviewed with patient? Yes  SHORT TERM GOALS: Target date: 09/21/2024   Pt will be consistent with initial HEP and walking program for improved strength, balance, transfers and gait. Baseline: Goal status: INITIAL  2.  Pt will improve 5 x STS to less than or equal to 35 seconds to demonstrate improved functional strength and transfer efficiency.   Baseline: 41.4s w/BUE support  Goal status: INITIAL  3.  Pt will  improve gait velocity to at least 0.55 m/s w/LRAD and SBA for improved gait efficiency and reduced fall risk   Baseline: 0.35m/s w/RW  Goal status: INITIAL  4.  Pt will ascend/descend 12 steps w/single rail on R and step-to pattern w/SBA for improved safety at home  Baseline:  Goal status: INITIAL  5.  Pt will perform floor transfer w/mod A for fall recovery and safety at home  Baseline:  Goal status: INITIAL   LONG TERM GOALS: Target date: 10/19/2024   Pt will be consistent with final HEP and walking program for improved strength, balance, transfers and gait. Baseline:  Goal status: INITIAL  2.  Pt will improve 5 x STS to less than or equal to 28 seconds to demonstrate improved functional strength and transfer efficiency.   Baseline: 41.4s w/BUE support  Goal status: INITIAL  3.  Pt will improve gait velocity to at least 0.65 m/s w/LRAD mod I for improved gait efficiency and independence   Baseline: 0.4 m/s w/RW  Goal status: INITIAL  4.  Pt will perform floor transfer w/min A for fall recovery and safety at home  Baseline:  Goal status: INITIAL   ASSESSMENT:  CLINICAL IMPRESSION: Patient is a 79 year old male referred to Neuro OPPT for cerebral infarction. Pt's PMH is significant for: CVA, HTN, HLD, GERD, and diabetes. The following deficits were present during the exam: decreased safety awareness, L hemiparesis, decreased activity tolerance, decreased functional strength, impaired short term memory and impaired vision. Based on gait speed, 5x STS and fall history, pt is an incr risk for falls. Pt would benefit from skilled PT to address these impairments and functional limitations to maximize functional mobility and independence.    OBJECTIVE IMPAIRMENTS: Abnormal gait, decreased activity tolerance, decreased balance, decreased cognition, decreased coordination, decreased endurance, decreased knowledge of condition, decreased knowledge of use of DME, decreased mobility,  difficulty walking, decreased strength, decreased safety awareness, increased edema, impaired perceived functional ability, impaired sensation, impaired tone, impaired vision/preception, and improper body mechanics.   ACTIVITY LIMITATIONS: carrying, lifting, bending, standing, squatting, stairs, transfers, reach over head, hygiene/grooming, locomotion level, and caring for others  PARTICIPATION LIMITATIONS: meal prep, cleaning, laundry, medication management, personal finances, interpersonal relationship, driving, shopping, community activity, and yard work  PERSONAL FACTORS: Fitness, Past/current experiences, and 1-2 comorbidities: CVA and aneurysm are also affecting patient's functional outcome.   REHAB POTENTIAL: Good  CLINICAL DECISION MAKING: Evolving/moderate complexity  EVALUATION COMPLEXITY: Moderate  PLAN:  PT FREQUENCY: 2x/week  PT DURATION: 8  weeks  PLANNED INTERVENTIONS: 97164- PT Re-evaluation, 97750- Physical Performance Testing, 97110-Therapeutic exercises, 97530- Therapeutic activity, 97112- Neuromuscular re-education, [phone]- Self Care, 02859- Manual therapy, [phone]- Gait training, [phone]- Aquatic Therapy, [phone]- Electrical stimulation (manual), Patient/Family education, Balance training, Stair training, Taping, Joint mobilization, Spinal mobilization, Vestibular training, Visual/preceptual remediation/compensation, and DME instructions  PLAN FOR NEXT SESSION: Assess vitals. Start on [employer]. Formally establish walking program and HEP for improved BLE strength (sit to stands, L glute med strength, safety w/RW). Stairs, floor transfers, endurance    Marlon [name] Mone Commisso, PT, DPT 08/24/2024, 4:45 PM        "

## 2024-08-31 ENCOUNTER — Ambulatory Visit: Admitting: Physical Therapy

## 2024-08-31 VITALS — BP 149/72 | HR 55

## 2024-08-31 DIAGNOSIS — R2681 Unsteadiness on feet: Secondary | ICD-10-CM

## 2024-08-31 DIAGNOSIS — M6281 Muscle weakness (generalized): Secondary | ICD-10-CM | POA: Diagnosis not present

## 2024-08-31 DIAGNOSIS — R2689 Other abnormalities of gait and mobility: Secondary | ICD-10-CM

## 2024-08-31 DIAGNOSIS — R41840 Attention and concentration deficit: Secondary | ICD-10-CM

## 2024-08-31 NOTE — Therapy (Signed)
 " OUTPATIENT PHYSICAL THERAPY NEURO TREATMENT   Patient Name: [name] [name] MRN: [id number] DOB:08/14/1946, 79 y.o., male Today's Date: 08/31/2024   PCP: [name] Other, MD REFERRING PROVIDER: Carilyn Prentice BRAVO, MD  END OF SESSION:  PT End of Session - 08/31/24 1317     Visit Number 2    Number of Visits 17    Date for Recertification  11/02/24    Authorization Type [employer] Medicare    PT Start Time 1315    PT Stop Time 1400    PT Time Calculation (min) 45 min    Equipment Utilized During Treatment Gait belt    Activity Tolerance Patient tolerated treatment well    Behavior During Therapy WFL for tasks assessed/performed           Past Medical History:  Diagnosis Date   Chronic back pain    stenosis   Diabetes mellitus without complication (HCC)    Enlarged prostate    GERD (gastroesophageal reflux disease)    occasionally will take a zantac(maybe once a month)   History of colon polyps    History of kidney stones    History of stress test    done 10 yrs. ago, as a baseline    Hyperlipidemia    takes Crestor  daily   Hypertension    takes Amlodipine  daily and Lotensin  as well   Stroke [facility])    Past Surgical History:  Procedure Laterality Date   ANTERIOR LAT LUMBAR FUSION Left 12/15/2014   Procedure: ANTERIOR LATERAL LUMBAR FUSION 1 LEVEL;  Surgeon: [name] Priestly, MD;  Location: [facility] OR;  Service: Orthopedics;  Laterality: Left;  Left sided lateral lumbar interbody fusion, lumbar 3-4, posterior spinal fusion, lumbar 3-4 with instrumentation.   COLONOSCOPY     ESOPHAGOGASTRODUODENOSCOPY     fatty tissue removed from stomach     LUMBAR LAMINECTOMY/DECOMPRESSION MICRODISCECTOMY N/A 07/08/2013   Procedure: LUMBAR LAMINECTOMY/DECOMPRESSION MICRODISCECTOMY;  Surgeon: [name] Rodgers Priestly, MD;  Location: [facility] OR;  Service: Orthopedics;  Laterality: N/A;  Lumbar 3-4, lumbar 4-5 decompression   RADIOLOGY WITH ANESTHESIA N/A 10/22/2019   Procedure: MRI WITH ANESTHESIA OF LUMBAR  SPINE WITHOUT CONTRAST;  Surgeon: Radiologist, Medication, MD;  Location: [facility] OR;  Service: Radiology;  Laterality: N/A;   right ankle surgery     as child    THORACIC AORTIC ENDOVASCULAR STENT GRAFT N/A 10/15/2019   Procedure: THORACIC AORTIC ENDOVASCULAR STENT GRAFT;  Surgeon: [name] Penne Bruckner, MD;  Location: [facility] OR;  Service: Vascular;  Laterality: N/A;   THORACOABDOMINAL AORTIC ANEURYSM REPAIR Bilateral 05/08/2024   Procedure: REPAIR, ANEURYSM, AORTA, THORACOABDOMINAL;  Surgeon: [name] Penne Bruckner, MD;  Location: [facility] OR;  Service: Vascular;  Laterality: Bilateral;   TONSILLECTOMY     as a child   ULTRASOUND GUIDANCE FOR VASCULAR ACCESS Bilateral 05/08/2024   Procedure: ULTRASOUND GUIDANCE, FOR VASCULAR ACCESS;  Surgeon: [name] Penne Bruckner, MD;  Location: [facility] OR;  Service: Vascular;  Laterality: Bilateral;   Patient Active Problem List   Diagnosis Date Noted   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) 05/23/2024   Stroke (cerebrum) (HCC) 05/17/2024   Status post abdominal aortic aneurysm (AAA) repair 05/08/2024   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   Acute encephalopathy 03/01/2024   Allergic rhinitis 09/21/2020   Benign prostatic hyperplasia 09/21/2020   Body mass index (BMI) 31.0-31.9, adult 09/21/2020   Cerebral infarction (HCC) 09/21/2020   Diabetic foot (HCC) 09/21/2020   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   Dysarthria 09/21/2020   ED (erectile dysfunction) of organic  origin 09/21/2020   Gait difficulty 09/21/2020   History of psychiatric disorder 09/21/2020   History of colonic polyps 09/21/2020   Pure hypercholesterolemia 09/21/2020   Unspecified thoracic, thoracolumbar and lumbosacral intervertebral disc disorder 09/21/2020   Hypertension    Hyperlipidemia    GERD (gastroesophageal reflux disease)    DM (diabetes mellitus) (HCC)    CKD (chronic kidney disease), stage III (HCC)    Normocytic anemia    Thrombocytopenia    UTI  (urinary tract infection)    AMS (altered mental status)    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   History of repair of dissecting thoracic aneurysm    Abdominal distension    Pulmonary embolus (HCC) 10/19/2019   CVA (cerebral vascular accident) (HCC) 10/19/2019   AKI (acute kidney injury) 10/19/2019   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) 10/19/2019   Ileus (HCC) 10/19/2019   Dissection of thoracoabdominal aorta (HCC)    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   Radiculopathy 12/15/2014    ONSET DATE: 07/30/2024 (referral)   REFERRING DIAG: P36.458 (ICD-10-CM) - 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 Denies falls or acute changes. Has been working on walking at home some, not as much as I should.   Pt accompanied by: Spouse, [name] [name] (in lobby)   PERTINENT HISTORY: CVA (1st one in 2020), HTN, HLD, GERD, and diabetes. admitted to rehab 05/17/2024 following aneurysm repair   PAIN:  Are you having pain? No  PRECAUTIONS: Fall  RED FLAGS: None   WEIGHT BEARING RESTRICTIONS: No  FALLS: Has patient fallen in last 6 months? Yes. Number of falls 3  LIVING ENVIRONMENT: Lives with: lives with their spouse Lives in: House/apartment Stairs: Yes: Internal: 14 steps; on right going up and External: 1 in back steps; none Has following equipment at home:  Vannie - 2 wheeled, Environmental Consultant - 4 wheeled, Wheelchair (manual), shower chair, bed side commode, and Grab bars  PLOF: Independent, Leisure: Publishing copy and piano, and Pt was not driving  PATIENT GOALS: I want to be able to make love again  OBJECTIVE:  Note: Objective measures were completed at Evaluation unless otherwise noted.  DIAGNOSTIC FINDINGS: CTA of chest/abd/pelv from 06/19/24 IMPRESSION: 1. Large filling defect in middle and lower lobe branches of the right pulmonary artery consistent with pulmonary embolus. This finding was discussed with Dr. Sheree at 3:07 pm on 06/19/2024. 2. 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 3. 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 4. 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 5. Moderate-sized right inguinal hernia containing loops of small bowel without obstruction. 6. Severely enlarged prostate gland with lobular extension into the urinary bladder.  CT of head from 05/16/24 IMPRESSION: CT HEAD:   1. Grossly stable appearance of previously identified small acute ischemic nonhemorrhagic right cerebellar infarct. 2. Age-related cerebral atrophy with mild chronic small vessel ischemic disease, with a few small remote occipital infarcts   CTA HEAD AND NECK:   1. Negative CTA for large vessel occlusion or [name] emergent finding. 2. Mild atheromatous change about the carotid bifurcations and carotid siphons without hemodynamically significant stenosis. 3. Diffuse tortuosity and ectasia of the major arterial vasculature of the head and neck, suggesting chronic underlying hypertension. 4. Previously noted attenuation of the proximal right CCA on prior MRA was consistent with artifact. No  stenosis in this region.   MRI of brain from 05/14/24 IMPRESSION: 1. 9 mm acute ischemic nonhemorrhagic right cerebellar infarct. 2. Underlying age-related cerebral atrophy with moderate chronic microvascular ischemic disease, with a few scattered remote lacunar infarcts about the periventricular white matter and corpus callosum. 3. Few small remote cortical to subcortical bilateral occipital infarcts.  MRA of head/neck from 05/15/24 IMPRESSION: MRA HEAD:   Negative intracranial MRA. No large vessel occlusion or hemodynamically significant stenosis. No aneurysm.   MRA NECK:   1. Somewhat technically limited exam due to motion and lack of IV contrast. 2. Asymmetric attenuation of the proximal right CCA. While this is suspected to be artifactual in nature as the vessel appears tortuous and ectatic at this level, a possible underlying stenosis is difficult to exclude given the technical limitations of this exam. Correlation with dedicated CTA could be performed for further evaluation as warranted. 3. Atheromatous change about the right carotid bulb without hemodynamically significant greater than 50% stenosis. 4. Wide patency of the left carotid artery system within the neck. 5. Wide patency of both vertebral arteries within the neck. Left vertebral artery dominant.  COGNITION: Overall cognitive status: Impaired: Areas of impairment:  Attention: Impaired: Selective, Alternating Memory: Impaired: Immediate Working It Consultant function: Impaired: Problem solving, Planning, Self-correction, and Slow processing    SENSATION: Pt reports bilateral numbness in feet    EDEMA: Chronic BLE edema    MUSCLE TONE: LLE: Hypotonic   POSTURE: rounded shoulders, forward head, increased thoracic kyphosis, and right pelvic obliquity  LOWER EXTREMITY ROM:    /  Active  Right Eval Left Eval  Hip flexion    Hip extension    Hip abduction    Hip adduction    Hip internal  rotation    Hip external rotation    Knee flexion    Knee extension    Ankle dorsiflexion    Ankle plantarflexion    Ankle inversion    Ankle eversion     (Blank rows = not tested)  LOWER EXTREMITY MMT:  tested  in seated position   MMT Right Eval Left Eval  Hip flexion 4- 3+  Hip extension    Hip abduction 4 4-  Hip adduction 4 3+  Hip internal rotation    Hip external rotation    Knee flexion 4- 3+  Knee extension 4 4  Ankle dorsiflexion 4 4-  Ankle plantarflexion    Ankle inversion    Ankle eversion    (Blank rows = not tested)  BED MOBILITY:  Not tested Independent per pt   TRANSFERS: Sit to stand: SBA  Assistive device utilized: Environmental Consultant - 2 wheeled     Stand to sit: SBA  Assistive device utilized: Environmental Consultant - 2 wheeled     Heavy reliance on BUE support w/poor eccentric control and decreased anterior weight shift   RAMP:  Not tested  CURB:  Not tested  STAIRS: Not tested GAIT: Gait pattern: step through pattern, decreased stride length, decreased ankle dorsiflexion- Right, decreased ankle dorsiflexion- Left, knee flexed in stance- Right, knee flexed in stance- Left, shuffling, ataxic, trendelenburg, lateral hip instability, trunk flexed, and narrow BOS Distance walked: Various clinic distances  Assistive device utilized: Environmental Consultant - 2 wheeled Level of assistance: SBA and CGA Comments: Pt w/inconsistent foot placement of LLE, significant Trendelenburg of LLE and maintained knee flexion bilaterally. Min cues to avoid hitting obstacles on L side throughout session and for upright posture.    FUNCTIONAL TESTS:      VITALS  Vitals:   08/31/24 1319  BP: (!) 149/72  Pulse: (!) 55                                                                                                                                  TREATMENT:   Self-care/home management  Assessed vitals in LUE (see above) and WNL for pt and session   Ther Act/NMR SciFit multi-peaks level 4.5 for 8  minutes using BUE/BLEs for neural priming for reciprocal movement, dynamic cardiovascular warmup and increased amplitude of stepping. Pt able to place feet in pedals independently but did use BUE support to move RLE. RPE of 9.5/10 following activity.  Reviewed and added to HEP from CIR (see bolded below) for improved BLE strength, step clearance and lateral weight shifting:  Mini squats w/BUE support at counter, x10 reps w/CGA Alt lateral lunge w/BUE support on counter, x10 reps per side w/CGA  Standing alt hip abd/ext w/BUE support on counter, x10 reps each per leg. Min cues to maintain upright posture throughout  Standing calf raises w/BUE support on counter, x10 reps. Pt reported difficulty w/this  Sit to stands w/BUE support and proper technique (using NDT principles) x8 reps. Pt required min cues to facilitate anterior weight shift and fully extend hips at top of repetition  Verbally reviewed HEP w/wife in lobby and informed her to guard pt while performing at home. Also encouraged pt attend each session w/pt due to cognitive impairments.  PATIENT EDUCATION: Education details: Updates to HEP and walking program  [place] educated: Patient and Spouse Education method: Medical Illustrator Education comprehension: verbalized understanding, returned demonstration, and needs further education  HOME EXERCISE PROGRAM: Access Code: [email or web address] URL: [email or web address] Date: 08/31/2024 Prepared by: Marlon Franziska Podgurski  Program Notes You Can Walk For A Certain Length Of Time Each Day                         Walk 5 minutes 2 times per day.           Increase 1-2  minutes every 7 days             Work up to 30 minutes (1-2 times per day).  Exercises - Side Lunge with Counter Support  - 1 x daily - 7 x weekly - 2 sets - 10 reps - Mini Squat with Counter Support  - 1 x daily - 7 x weekly - 2 sets - 10 reps - Standing Hip Abduction with Counter Support  - 1 x daily - 7 x  weekly - 2-3 sets - 10 reps - Standing Hip Extension with Counter Support  - 1 x daily - 7 x weekly - 2-3 sets - 10 reps - Heel Raises with Counter Support  - 1 x daily - 7 x weekly - 2-3 sets - 10 reps - Sit to Stand with Armchair  - 1 x daily - 7 x weekly - 3 sets - 10 reps - Proper Sit to Stand Technique  - 1 x daily - 7 x weekly - 2 sets - 8-10 reps               GOALS: Goals reviewed with patient? Yes  SHORT TERM GOALS: Target date: 09/21/2024   Pt will be consistent with initial HEP and walking program for improved strength, balance, transfers and gait. Baseline: Goal status: INITIAL  2.  Pt will improve 5 x STS to less than or equal to 35 seconds to demonstrate improved functional strength and transfer efficiency.   Baseline: 41.4s w/BUE support  Goal status: INITIAL  3.  Pt will improve gait velocity to at least 0.55 m/s w/LRAD and SBA for improved gait efficiency and reduced fall risk   Baseline: 0.10m/s w/RW  Goal status: INITIAL  4.  Pt will ascend/descend 12 steps w/single rail on R and step-to pattern w/SBA for improved safety at home  Baseline:  Goal status: INITIAL  5.  Pt will perform floor transfer w/mod A for fall recovery and safety at home  Baseline:  Goal status: INITIAL   LONG TERM GOALS: Target date: 10/19/2024   Pt will be consistent with final HEP and walking program for improved strength, balance, transfers and gait. Baseline:  Goal status: INITIAL  2.  Pt will improve 5 x STS to less than or equal to 28 seconds to demonstrate improved functional strength and transfer efficiency.   Baseline: 41.4s w/BUE support  Goal status: INITIAL  3.  Pt will improve gait velocity to at least 0.65 m/s w/LRAD mod I for improved gait efficiency and independence   Baseline: 0.4 m/s w/RW  Goal status: INITIAL  4.  Pt will perform floor transfer w/min A for fall recovery and safety at home  Baseline:  Goal status: INITIAL   ASSESSMENT:  CLINICAL  IMPRESSION: Emphasis of skilled PT session on endurance, reviewing HEP and transfers. Pt reports he has been walking some at home, but still  utilizing WC.  Established formal walking program today and encouraged pt to avoid using WC when possible to work on endurance, strength and safety w/AD. Pt demonstrates inattention to L side and does require occasional verbal cues to avoid obstacles on L. Reviewed HEP from CIR and added sit to stands (two variations to cue pt on foot placement and anterior lean). Verbally reviewed HEP w/wife in lobby following session and recommend she attend PT sessions w/pt due to impaired cognition. Continue POC.    OBJECTIVE IMPAIRMENTS: Abnormal gait, decreased activity tolerance, decreased balance, decreased cognition, decreased coordination, decreased endurance, decreased knowledge of condition, decreased knowledge of use of DME, decreased mobility, difficulty walking, decreased strength, decreased safety awareness, increased edema, impaired perceived functional ability, impaired sensation, impaired tone, impaired vision/preception, and improper body mechanics.   ACTIVITY LIMITATIONS: carrying, lifting, bending, standing, squatting, stairs, transfers, reach over head, hygiene/grooming, locomotion level, and caring for others  PARTICIPATION LIMITATIONS: meal prep, cleaning, laundry, medication management, personal finances, interpersonal relationship, driving, shopping, community activity, and yard work  PERSONAL FACTORS: Fitness, Past/current experiences, and 1-2 comorbidities: CVA and aneurysm are also affecting patient's functional outcome.   REHAB POTENTIAL: Good  CLINICAL DECISION MAKING: Evolving/moderate complexity  EVALUATION COMPLEXITY: Moderate  PLAN:  PT FREQUENCY: 2x/week  PT DURATION: 8 weeks  PLANNED INTERVENTIONS: 97164- PT Re-evaluation, 97750- Physical Performance Testing, 97110-Therapeutic exercises, 97530- Therapeutic activity, 97112-  Neuromuscular re-education, 97535- Self Care, [id number]- Manual therapy, [phone]- Gait training, [phone]- Aquatic Therapy, [phone]- Electrical stimulation (manual), Patient/Family education, Balance training, Stair training, Taping, Joint mobilization, Spinal mobilization, Vestibular training, Visual/preceptual remediation/compensation, and DME instructions  PLAN FOR NEXT SESSION: Assess vitals. Start on SciFit. Add to walking program and HEP for improved BLE strength (L glute med strength, safety w/RW). Stairs, floor transfers, endurance, toe taps to 4-6 step, hip extension, visual scanning to L side    Eartha Vonbehren E Lain Tetterton, PT, DPT 08/31/2024, 2:03 PM        "

## 2024-09-04 ENCOUNTER — Ambulatory Visit: Admitting: Physical Therapy

## 2024-09-04 VITALS — BP 147/72 | HR 56

## 2024-09-04 DIAGNOSIS — R2681 Unsteadiness on feet: Secondary | ICD-10-CM

## 2024-09-04 DIAGNOSIS — M6281 Muscle weakness (generalized): Secondary | ICD-10-CM | POA: Diagnosis not present

## 2024-09-04 DIAGNOSIS — R2689 Other abnormalities of gait and mobility: Secondary | ICD-10-CM

## 2024-09-04 DIAGNOSIS — Z9181 History of falling: Secondary | ICD-10-CM

## 2024-09-04 NOTE — Therapy (Signed)
 " OUTPATIENT PHYSICAL THERAPY NEURO TREATMENT   Patient Name: [name] [name] MRN: [id number] DOB:Dec 19, 1945, 79 y.o., male Today's Date: 09/04/2024   PCP: [name], MD REFERRING PROVIDER: Carilyn Prentice BRAVO, MD  END OF SESSION:  PT End of Session - 09/04/24 1316     Visit Number 3    Number of Visits 17    Date for Recertification  11/02/24    Authorization Type [employer] Medicare    PT Start Time 1314    PT Stop Time 1359    PT Time Calculation (min) 45 min    Equipment Utilized During Treatment Gait belt    Activity Tolerance Patient tolerated treatment well    Behavior During Therapy WFL for tasks assessed/performed            Past Medical History:  Diagnosis Date   Chronic back pain    stenosis   Diabetes mellitus without complication (HCC)    Enlarged prostate    GERD (gastroesophageal reflux disease)    occasionally will take a zantac(maybe once a month)   History of colon polyps    History of kidney stones    History of stress test    done 10 yrs. ago, as a baseline    Hyperlipidemia    takes Crestor  daily   Hypertension    takes Amlodipine  daily and Lotensin  as well   Stroke [facility])    Past Surgical History:  Procedure Laterality Date   ANTERIOR LAT LUMBAR FUSION Left 12/15/2014   Procedure: ANTERIOR LATERAL LUMBAR FUSION 1 LEVEL;  Surgeon: Oneil Priestly, MD;  Location: [facility] OR;  Service: Orthopedics;  Laterality: Left;  Left sided lateral lumbar interbody fusion, lumbar 3-4, posterior spinal fusion, lumbar 3-4 with instrumentation.   COLONOSCOPY     ESOPHAGOGASTRODUODENOSCOPY     fatty tissue removed from stomach     LUMBAR LAMINECTOMY/DECOMPRESSION MICRODISCECTOMY N/A 07/08/2013   Procedure: LUMBAR LAMINECTOMY/DECOMPRESSION MICRODISCECTOMY;  Surgeon: Oneil Rodgers Priestly, MD;  Location: [facility] OR;  Service: Orthopedics;  Laterality: N/A;  Lumbar 3-4, lumbar 4-5 decompression   RADIOLOGY WITH ANESTHESIA N/A 10/22/2019   Procedure: MRI WITH ANESTHESIA OF LUMBAR  SPINE WITHOUT CONTRAST;  Surgeon: Radiologist, Medication, MD;  Location: [facility] OR;  Service: Radiology;  Laterality: N/A;   right ankle surgery     as child    THORACIC AORTIC ENDOVASCULAR STENT GRAFT N/A 10/15/2019   Procedure: THORACIC AORTIC ENDOVASCULAR STENT GRAFT;  Surgeon: Sheree Penne Bruckner, MD;  Location: [facility] OR;  Service: Vascular;  Laterality: N/A;   THORACOABDOMINAL AORTIC ANEURYSM REPAIR Bilateral 05/08/2024   Procedure: REPAIR, ANEURYSM, AORTA, THORACOABDOMINAL;  Surgeon: Sheree Penne Bruckner, MD;  Location: [facility] OR;  Service: Vascular;  Laterality: Bilateral;   TONSILLECTOMY     as a child   ULTRASOUND GUIDANCE FOR VASCULAR ACCESS Bilateral 05/08/2024   Procedure: ULTRASOUND GUIDANCE, FOR VASCULAR ACCESS;  Surgeon: Sheree Penne Bruckner, MD;  Location: [facility] OR;  Service: Vascular;  Laterality: Bilateral;   Patient Active Problem List   Diagnosis Date Noted   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) 05/23/2024   Stroke (cerebrum) (HCC) 05/17/2024   Status post abdominal aortic aneurysm (AAA) repair 05/08/2024   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   Acute encephalopathy 03/01/2024   Allergic rhinitis 09/21/2020   Benign prostatic hyperplasia 09/21/2020   Body mass index (BMI) 31.0-31.9, adult 09/21/2020   Cerebral infarction (HCC) 09/21/2020   Diabetic foot (HCC) 09/21/2020   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   Dysarthria 09/21/2020   ED (erectile dysfunction) of  organic origin 09/21/2020   Gait difficulty 09/21/2020   History of psychiatric disorder 09/21/2020   History of colonic polyps 09/21/2020   Pure hypercholesterolemia 09/21/2020   Unspecified thoracic, thoracolumbar and lumbosacral intervertebral disc disorder 09/21/2020   Hypertension    Hyperlipidemia    GERD (gastroesophageal reflux disease)    DM (diabetes mellitus) (HCC)    CKD (chronic kidney disease), stage III (HCC)    Normocytic anemia    Thrombocytopenia    UTI  (urinary tract infection)    AMS (altered mental status)    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   History of repair of dissecting thoracic aneurysm    Abdominal distension    Pulmonary embolus (HCC) 10/19/2019   CVA (cerebral vascular accident) (HCC) 10/19/2019   AKI (acute kidney injury) 10/19/2019   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) 10/19/2019   Ileus (HCC) 10/19/2019   Dissection of thoracoabdominal aorta (HCC)    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   Radiculopathy 12/15/2014    ONSET DATE: 07/30/2024 (referral)   REFERRING DIAG: P36.458 (ICD-10-CM) - 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  Pt accompanied by: [name] - nickname Argyle   PERTINENT HISTORY: CVA (1st one in 2020), HTN, HLD, GERD, and diabetes. admitted to rehab 05/17/2024 following aneurysm repair   PAIN:  Are you having pain? No  PRECAUTIONS: Fall  RED FLAGS: None   WEIGHT BEARING RESTRICTIONS: No  FALLS: Has patient fallen in last 6 months? Yes. Number of falls 3  LIVING ENVIRONMENT: Lives with: lives with their spouse Lives in: House/apartment Stairs: Yes: Internal: 14 steps; on right going up and External: 1 in back steps; none Has following equipment at home: Vannie - 2 wheeled, Environmental Consultant - 4 wheeled, Wheelchair (manual), shower chair, bed side commode, and Grab bars  PLOF:  Independent, Leisure: Publishing copy and piano, and Pt was not driving  PATIENT GOALS: I want to be able to make love again   OBJECTIVE:  Note: Objective measures were completed at Evaluation unless otherwise noted.  DIAGNOSTIC FINDINGS: CTA of chest/abd/pelv  from 06/19/24 IMPRESSION: 1. Large filling defect in middle and lower lobe branches of the right pulmonary artery consistent with pulmonary embolus. This finding was discussed with Dr. Sheree at 3:07 pm on 06/19/2024. 2. 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 3. 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 4. 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 5. Moderate-sized right inguinal hernia containing loops of small bowel without obstruction. 6. Severely enlarged prostate gland with lobular extension into the urinary bladder.  CT of head from 05/16/24 IMPRESSION: CT HEAD:   1. Grossly stable appearance of previously identified small acute ischemic nonhemorrhagic right cerebellar infarct. 2. Age-related cerebral atrophy with mild chronic small vessel ischemic disease, with a few small remote occipital infarcts   CTA HEAD AND NECK:   1. Negative CTA for large vessel occlusion or other emergent finding. 2. Mild atheromatous change about the carotid bifurcations and carotid siphons without hemodynamically significant stenosis. 3. Diffuse tortuosity and ectasia of the major arterial vasculature of the head and neck, suggesting chronic underlying hypertension. 4. Previously noted attenuation of the proximal right CCA on prior MRA was consistent with artifact. No stenosis in this region.   MRI of brain from 05/14/24 IMPRESSION: 1. 9 mm acute ischemic nonhemorrhagic right cerebellar  infarct. 2. Underlying age-related cerebral atrophy with moderate chronic microvascular ischemic disease, with a few scattered remote lacunar infarcts about the periventricular white matter and corpus callosum. 3. Few small remote cortical to subcortical bilateral occipital infarcts.  MRA of head/neck from 05/15/24 IMPRESSION: MRA HEAD:   Negative intracranial MRA. No large vessel occlusion or hemodynamically significant stenosis. No aneurysm.   MRA NECK:   1. Somewhat technically limited exam due to motion and lack of IV contrast. 2. Asymmetric attenuation of the proximal right CCA. While this is suspected to be artifactual in nature as the vessel appears tortuous and ectatic at this level, a possible underlying stenosis is difficult to exclude given the technical limitations of this exam. Correlation with dedicated CTA could be performed for further evaluation as warranted. 3. Atheromatous change about the right carotid bulb without hemodynamically significant greater than 50% stenosis. 4. Wide patency of the left carotid artery system within the neck. 5. Wide patency of both vertebral arteries within the neck. Left vertebral artery dominant.  COGNITION: Overall cognitive status: Impaired: Areas of impairment:  Attention: Impaired: Selective, Alternating Memory: Impaired: Immediate Working It Consultant function: Impaired: Problem solving, Planning, Self-correction, and Slow processing    SENSATION: Pt reports bilateral numbness in feet    EDEMA: Chronic BLE edema    MUSCLE TONE: LLE: Hypotonic   POSTURE: rounded shoulders, forward head, increased thoracic kyphosis, and right pelvic obliquity  LOWER EXTREMITY ROM:    /  Active  Right Eval Left Eval  Hip flexion    Hip extension    Hip abduction    Hip adduction    Hip internal rotation    Hip external rotation    Knee flexion    Knee extension    Ankle dorsiflexion    Ankle plantarflexion     Ankle inversion    Ankle eversion     (Blank rows = not tested)  LOWER EXTREMITY MMT:  tested in seated position   MMT Right Eval Left Eval  Hip flexion 4- 3+  Hip extension  Hip abduction 4 4-  Hip adduction 4 3+  Hip internal rotation    Hip external rotation    Knee flexion 4- 3+  Knee extension 4 4  Ankle dorsiflexion 4 4-  Ankle plantarflexion    Ankle inversion    Ankle eversion    (Blank rows = not tested)  BED MOBILITY:  Not tested Independent per pt   TRANSFERS: Sit to stand: SBA  Assistive device utilized: Environmental Consultant - 2 wheeled     Stand to sit: SBA  Assistive device utilized: Environmental Consultant - 2 wheeled     Heavy reliance on BUE support w/poor eccentric control and decreased anterior weight shift   RAMP:  Not tested  CURB:  Not tested  STAIRS: Not tested GAIT: Gait pattern: step through pattern, decreased stride length, decreased ankle dorsiflexion- Right, decreased ankle dorsiflexion- Left, knee flexed in stance- Right, knee flexed in stance- Left, shuffling, ataxic, trendelenburg, lateral hip instability, trunk flexed, and narrow BOS Distance walked: Various clinic distances  Assistive device utilized: Environmental Consultant - 2 wheeled Level of assistance: SBA and CGA Comments: Pt w/inconsistent foot placement of LLE, significant Trendelenburg of LLE and maintained knee flexion bilaterally. Min cues to avoid hitting obstacles on L side throughout session and for upright posture.    FUNCTIONAL TESTS:      VITALS  Vitals:   09/04/24 1320  BP: (!) 147/72  Pulse: (!) 56                                                                                                                                   TREATMENT:   Self-care/home management  Assessed vitals in LUE (see above) and WNL for pt and session   Ther Act/NMR SciFit multi-peaks level 4.5 for 8 minutes using BUE/BLEs for neural priming for reciprocal movement, dynamic cardiovascular warmup and increased  amplitude of stepping. Pt able to place feet in pedals independently but did use BUE support to move RLE. Pt unable to give an RPE but states it was not as difficult as last visit at same level. To work on functional LE strengthening: Alt L/R forwards 6 step ups x 10 reps B Cues for upright posture Attempt alt L/R 6 eccentric step downs Difficulty with performing at this step height, transitioned to 4 with RLE and 2 with LLE X 10 reps B Alt L/R lateral 6 step ups x 10 reps B Pt again needs cues for upright posture    PATIENT EDUCATION: Education details: continue HEP, can work on seated stepper at home [place] educated: Patient and Spouse Education method: Medical Illustrator Education comprehension: verbalized understanding, returned demonstration, and needs further education  HOME EXERCISE PROGRAM: Access Code: [email or web address] URL: [email or web address] Date: 08/31/2024 Prepared by: Marlon Plaster  Program Notes You Can Walk For A Certain Length Of Time Each Day  Walk 5 minutes 2 times per day.           Increase 1-2  minutes every 7 days             Work up to 30 minutes (1-2 times per day).  Exercises - Side Lunge with Counter Support  - 1 x daily - 7 x weekly - 2 sets - 10 reps - Mini Squat with Counter Support  - 1 x daily - 7 x weekly - 2 sets - 10 reps - Standing Hip Abduction with Counter Support  - 1 x daily - 7 x weekly - 2-3 sets - 10 reps - Standing Hip Extension with Counter Support  - 1 x daily - 7 x weekly - 2-3 sets - 10 reps - Heel Raises with Counter Support  - 1 x daily - 7 x weekly - 2-3 sets - 10 reps - Sit to Stand with Armchair  - 1 x daily - 7 x weekly - 3 sets - 10 reps - Proper Sit to Stand Technique  - 1 x daily - 7 x weekly - 2 sets - 8-10 reps               GOALS: Goals reviewed with patient? Yes  SHORT TERM GOALS: Target date: 09/21/2024   Pt will be consistent with initial HEP and walking program  for improved strength, balance, transfers and gait. Baseline: Goal status: INITIAL  2.  Pt will improve 5 x STS to less than or equal to 35 seconds to demonstrate improved functional strength and transfer efficiency.   Baseline: 41.4s w/BUE support  Goal status: INITIAL  3.  Pt will improve gait velocity to at least 0.55 m/s w/LRAD and SBA for improved gait efficiency and reduced fall risk   Baseline: 0.20m/s w/RW  Goal status: INITIAL  4.  Pt will ascend/descend 12 steps w/single rail on R and step-to pattern w/SBA for improved safety at home  Baseline:  Goal status: INITIAL  5.  Pt will perform floor transfer w/mod A for fall recovery and safety at home  Baseline:  Goal status: INITIAL   LONG TERM GOALS: Target date: 10/19/2024   Pt will be consistent with final HEP and walking program for improved strength, balance, transfers and gait. Baseline:  Goal status: INITIAL  2.  Pt will improve 5 x STS to less than or equal to 28 seconds to demonstrate improved functional strength and transfer efficiency.   Baseline: 41.4s w/BUE support  Goal status: INITIAL  3.  Pt will improve gait velocity to at least 0.65 m/s w/LRAD mod I for improved gait efficiency and independence   Baseline: 0.4 m/s w/RW  Goal status: INITIAL  4.  Pt will perform floor transfer w/min A for fall recovery and safety at home  Baseline:  Goal status: INITIAL   ASSESSMENT:  CLINICAL IMPRESSION: Emphasis of skilled PT session on continuing to work on global endurance training and functional BLE strengthening. Pt is not as challenged by SciFit this visit at same level as last visit, however unable to provide an RPE. He does exhibit decreased LLE strength as compared to RLE and continues to benefit from strengthening of his LE to improve his function and decrease his fall risk. Continue POC.    OBJECTIVE IMPAIRMENTS: Abnormal gait, decreased activity tolerance, decreased balance, decreased cognition,  decreased coordination, decreased endurance, decreased knowledge of condition, decreased knowledge of use of DME, decreased mobility, difficulty walking, decreased strength, decreased safety awareness, increased  edema, impaired perceived functional ability, impaired sensation, impaired tone, impaired vision/preception, and improper body mechanics.   ACTIVITY LIMITATIONS: carrying, lifting, bending, standing, squatting, stairs, transfers, reach over head, hygiene/grooming, locomotion level, and caring for others  PARTICIPATION LIMITATIONS: meal prep, cleaning, laundry, medication management, personal finances, interpersonal relationship, driving, shopping, community activity, and yard work  PERSONAL FACTORS: Fitness, Past/current experiences, and 1-2 comorbidities: CVA and aneurysm are also affecting patient's functional outcome.   REHAB POTENTIAL: Good  CLINICAL DECISION MAKING: Evolving/moderate complexity  EVALUATION COMPLEXITY: Moderate  PLAN:  PT FREQUENCY: 2x/week  PT DURATION: 8 weeks  PLANNED INTERVENTIONS: 97164- PT Re-evaluation, 97750- Physical Performance Testing, 97110-Therapeutic exercises, 97530- Therapeutic activity, 97112- Neuromuscular re-education, 97535- Self Care, [id number]- Manual therapy, [phone]- Gait training, [phone]- Aquatic Therapy, [phone]- Electrical stimulation (manual), Patient/Family education, Balance training, Stair training, Taping, Joint mobilization, Spinal mobilization, Vestibular training, Visual/preceptual remediation/compensation, and DME instructions  PLAN FOR NEXT SESSION: Assess vitals. Start on SciFit. Add to walking program and HEP for improved BLE strength (L glute med strength, safety w/RW). Stairs, floor transfers, endurance, toe taps to 4-6 step, hip extension, visual scanning to L side, sidestepping, progression of step ups, work on upright posture  Has 2 and 5# dumbbell type weights at home   Waddell Southgate, PT Waddell Southgate, PT, DPT,  CSRS  09/04/2024, 1:59 PM        "

## 2024-09-07 ENCOUNTER — Ambulatory Visit: Admitting: Physical Therapy

## 2024-09-11 ENCOUNTER — Ambulatory Visit

## 2024-09-11 ENCOUNTER — Ambulatory Visit: Admitting: Physical Therapy

## 2024-09-11 ENCOUNTER — Other Ambulatory Visit: Payer: Self-pay

## 2024-09-11 VITALS — BP 154/75 | HR 55

## 2024-09-11 DIAGNOSIS — M6281 Muscle weakness (generalized): Secondary | ICD-10-CM | POA: Diagnosis not present

## 2024-09-11 DIAGNOSIS — Z9181 History of falling: Secondary | ICD-10-CM

## 2024-09-11 DIAGNOSIS — R2689 Other abnormalities of gait and mobility: Secondary | ICD-10-CM

## 2024-09-11 DIAGNOSIS — R41841 Cognitive communication deficit: Secondary | ICD-10-CM

## 2024-09-11 DIAGNOSIS — R2681 Unsteadiness on feet: Secondary | ICD-10-CM

## 2024-09-11 DIAGNOSIS — R41840 Attention and concentration deficit: Secondary | ICD-10-CM

## 2024-09-11 NOTE — Therapy (Signed)
 " OUTPATIENT PHYSICAL THERAPY NEURO TREATMENT   Patient Name: [name] [name] MRN: [id number] DOB:07-20-1946, 79 y.o., male Today's Date: 09/11/2024   PCP: [name], MD REFERRING PROVIDER: Carilyn Prentice BRAVO, MD  END OF SESSION:  PT End of Session - 09/11/24 1319     Visit Number 4    Number of Visits 17    Date for Recertification  11/02/24    Authorization Type [employer] Medicare    PT Start Time 1318    PT Stop Time 1401    PT Time Calculation (min) 43 min    Equipment Utilized During Treatment Gait belt    Activity Tolerance Patient tolerated treatment well    Behavior During Therapy WFL for tasks assessed/performed            Past Medical History:  Diagnosis Date   Chronic back pain    stenosis   Diabetes mellitus without complication (HCC)    Enlarged prostate    GERD (gastroesophageal reflux disease)    occasionally will take a zantac(maybe once a month)   History of colon polyps    History of kidney stones    History of stress test    done 10 yrs. ago, as a baseline    Hyperlipidemia    takes Crestor  daily   Hypertension    takes Amlodipine  daily and Lotensin  as well   Stroke [facility])    Past Surgical History:  Procedure Laterality Date   ANTERIOR LAT LUMBAR FUSION Left 12/15/2014   Procedure: ANTERIOR LATERAL LUMBAR FUSION 1 LEVEL;  Surgeon: Oneil Priestly, MD;  Location: [facility] OR;  Service: Orthopedics;  Laterality: Left;  Left sided lateral lumbar interbody fusion, lumbar 3-4, posterior spinal fusion, lumbar 3-4 with instrumentation.   COLONOSCOPY     ESOPHAGOGASTRODUODENOSCOPY     fatty tissue removed from stomach     LUMBAR LAMINECTOMY/DECOMPRESSION MICRODISCECTOMY N/A 07/08/2013   Procedure: LUMBAR LAMINECTOMY/DECOMPRESSION MICRODISCECTOMY;  Surgeon: Oneil Rodgers Priestly, MD;  Location: [facility] OR;  Service: Orthopedics;  Laterality: N/A;  Lumbar 3-4, lumbar 4-5 decompression   RADIOLOGY WITH ANESTHESIA N/A 10/22/2019   Procedure: MRI WITH ANESTHESIA OF LUMBAR  SPINE WITHOUT CONTRAST;  Surgeon: Radiologist, Medication, MD;  Location: [facility] OR;  Service: Radiology;  Laterality: N/A;   right ankle surgery     as child    THORACIC AORTIC ENDOVASCULAR STENT GRAFT N/A 10/15/2019   Procedure: THORACIC AORTIC ENDOVASCULAR STENT GRAFT;  Surgeon: Sheree Penne Bruckner, MD;  Location: [facility] OR;  Service: Vascular;  Laterality: N/A;   THORACOABDOMINAL AORTIC ANEURYSM REPAIR Bilateral 05/08/2024   Procedure: REPAIR, ANEURYSM, AORTA, THORACOABDOMINAL;  Surgeon: Sheree Penne Bruckner, MD;  Location: [facility] OR;  Service: Vascular;  Laterality: Bilateral;   TONSILLECTOMY     as a child   ULTRASOUND GUIDANCE FOR VASCULAR ACCESS Bilateral 05/08/2024   Procedure: ULTRASOUND GUIDANCE, FOR VASCULAR ACCESS;  Surgeon: Sheree Penne Bruckner, MD;  Location: [facility] OR;  Service: Vascular;  Laterality: Bilateral;   Patient Active Problem List   Diagnosis Date Noted   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) 05/23/2024   Stroke (cerebrum) (HCC) 05/17/2024   Status post abdominal aortic aneurysm (AAA) repair 05/08/2024   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   Acute encephalopathy 03/01/2024   Allergic rhinitis 09/21/2020   Benign prostatic hyperplasia 09/21/2020   Body mass index (BMI) 31.0-31.9, adult 09/21/2020   Cerebral infarction (HCC) 09/21/2020   Diabetic foot (HCC) 09/21/2020   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   Dysarthria 09/21/2020   ED (erectile dysfunction) of  organic origin 09/21/2020   Gait difficulty 09/21/2020   History of psychiatric disorder 09/21/2020   History of colonic polyps 09/21/2020   Pure hypercholesterolemia 09/21/2020   Unspecified thoracic, thoracolumbar and lumbosacral intervertebral disc disorder 09/21/2020   Hypertension    Hyperlipidemia    GERD (gastroesophageal reflux disease)    DM (diabetes mellitus) (HCC)    CKD (chronic kidney disease), stage III (HCC)    Normocytic anemia    Thrombocytopenia    UTI  (urinary tract infection)    AMS (altered mental status)    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   History of repair of dissecting thoracic aneurysm    Abdominal distension    Pulmonary embolus (HCC) 10/19/2019   CVA (cerebral vascular accident) (HCC) 10/19/2019   AKI (acute kidney injury) 10/19/2019   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) 10/19/2019   Ileus (HCC) 10/19/2019   Dissection of thoracoabdominal aorta (HCC)    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   Radiculopathy 12/15/2014    ONSET DATE: 07/30/2024 (referral)   REFERRING DIAG: P36.458 (ICD-10-CM) - 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 States he has been walking some at home but not working on the exercises like he should be. No falls.   Pt accompanied by: [name] - nickname Argyle   PERTINENT HISTORY: CVA (1st one in 2020), HTN, HLD, GERD, and diabetes. admitted to rehab 05/17/2024 following aneurysm repair   PAIN:  Are you having pain? No  PRECAUTIONS: Fall  RED FLAGS: None   WEIGHT BEARING RESTRICTIONS: No  FALLS: Has patient fallen in last 6 months? Yes. Number of falls 3  LIVING ENVIRONMENT: Lives with: lives with their spouse Lives in: House/apartment Stairs: Yes: Internal: 14 steps; on right going up and External: 1 in back steps; none Has  following equipment at home: Vannie - 2 wheeled, Environmental Consultant - 4 wheeled, Wheelchair (manual), shower chair, bed side commode, and Grab bars  PLOF: Independent, Leisure: Publishing copy and piano, and Pt was not driving  PATIENT GOALS: I want to  be able to make love again   OBJECTIVE:  Note: Objective measures were completed at Evaluation unless otherwise noted.  DIAGNOSTIC FINDINGS: CTA of chest/abd/pelv from 06/19/24 IMPRESSION: 1. Large filling defect in middle and lower lobe branches of the right pulmonary artery consistent with pulmonary embolus. This finding was discussed with Dr. Sheree at 3:07 pm on 06/19/2024. 2. 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 3. 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 4. 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 5. Moderate-sized right inguinal hernia containing loops of small bowel without obstruction. 6. Severely enlarged prostate gland with lobular extension into the urinary bladder.  CT of head from 05/16/24 IMPRESSION: CT HEAD:   1. Grossly stable appearance of previously identified small acute ischemic nonhemorrhagic right cerebellar infarct. 2. Age-related cerebral atrophy with mild chronic small vessel ischemic disease, with a few small remote occipital infarcts   CTA HEAD AND NECK:   1. Negative CTA for large vessel occlusion or other emergent finding. 2. Mild atheromatous change about the carotid bifurcations and carotid siphons without hemodynamically significant stenosis. 3. Diffuse tortuosity and ectasia of the major arterial vasculature of the head and neck, suggesting chronic underlying hypertension. 4. Previously noted attenuation of the proximal right CCA on prior MRA was  consistent with artifact. No stenosis in this region.   MRI of brain from 05/14/24 IMPRESSION: 1. 9 mm acute ischemic nonhemorrhagic right cerebellar infarct. 2. Underlying age-related cerebral atrophy with moderate chronic microvascular ischemic disease, with a few scattered remote lacunar infarcts about the periventricular white matter and corpus callosum. 3. Few small remote cortical to subcortical bilateral occipital infarcts.  MRA of head/neck from 05/15/24 IMPRESSION: MRA HEAD:   Negative intracranial MRA. No large vessel occlusion or hemodynamically significant stenosis. No aneurysm.   MRA NECK:   1. Somewhat technically limited exam due to motion and lack of IV contrast. 2. Asymmetric attenuation of the proximal right CCA. While this is suspected to be artifactual in nature as the vessel appears tortuous and ectatic at this level, a possible underlying stenosis is difficult to exclude given the technical limitations of this exam. Correlation with dedicated CTA could be performed for further evaluation as warranted. 3. Atheromatous change about the right carotid bulb without hemodynamically significant greater than 50% stenosis. 4. Wide patency of the left carotid artery system within the neck. 5. Wide patency of both vertebral arteries within the neck. Left vertebral artery dominant.  COGNITION: Overall cognitive status: Impaired: Areas of impairment:  Attention: Impaired: Selective, Alternating Memory: Impaired: Immediate Working It Consultant function: Impaired: Problem solving, Planning, Self-correction, and Slow processing    SENSATION: Pt reports bilateral numbness in feet    EDEMA: Chronic BLE edema    MUSCLE TONE: LLE: Hypotonic   POSTURE: rounded shoulders, forward head, increased thoracic kyphosis, and right pelvic obliquity  LOWER EXTREMITY ROM:    /  Active  Right Eval Left Eval  Hip flexion    Hip extension    Hip abduction    Hip  adduction    Hip internal rotation    Hip external rotation    Knee flexion    Knee extension    Ankle dorsiflexion    Ankle plantarflexion    Ankle inversion    Ankle eversion     (Blank rows =  not tested)  LOWER EXTREMITY MMT:  tested in seated position   MMT Right Eval Left Eval  Hip flexion 4- 3+  Hip extension    Hip abduction 4 4-  Hip adduction 4 3+  Hip internal rotation    Hip external rotation    Knee flexion 4- 3+  Knee extension 4 4  Ankle dorsiflexion 4 4-  Ankle plantarflexion    Ankle inversion    Ankle eversion    (Blank rows = not tested)  BED MOBILITY:  Not tested Independent per pt   TRANSFERS: Sit to stand: SBA  Assistive device utilized: Environmental Consultant - 2 wheeled     Stand to sit: SBA  Assistive device utilized: Environmental Consultant - 2 wheeled     Heavy reliance on BUE support w/poor eccentric control and decreased anterior weight shift   RAMP:  Not tested  CURB:  Not tested  STAIRS: Not tested GAIT: Gait pattern: step through pattern, decreased stride length, decreased ankle dorsiflexion- Right, decreased ankle dorsiflexion- Left, knee flexed in stance- Right, knee flexed in stance- Left, shuffling, ataxic, trendelenburg, lateral hip instability, trunk flexed, and narrow BOS Distance walked: Various clinic distances  Assistive device utilized: Environmental Consultant - 2 wheeled Level of assistance: SBA and CGA Comments: Pt w/inconsistent foot placement of LLE, significant Trendelenburg of LLE and maintained knee flexion bilaterally. Min cues to avoid hitting obstacles on L side throughout session and for upright posture.    FUNCTIONAL TESTS:      VITALS  Vitals:   09/11/24 1321  BP: (!) 154/75  Pulse: (!) 55                                                                                                                                    TREATMENT:   Self-care/home management  Assessed vitals in LUE (see above) and WNL for pt and session   Ther  Act/NMR SciFit multi-peaks level 7.5 for 8 minutes using BUE/BLEs for neural priming for reciprocal movement, dynamic cardiovascular warmup and increased amplitude of stepping. Pt able to place feet in pedals independently but did require min cues to properly align pelvis on seat (windswept to R). RPE of 1/10 following activity.  6 Blaze pods on random reach setting for improved LE coordination, single leg stability, visual scanning and functional hip strength.  Performed on 2 minute intervals with 30s standing rest periods.  Pt requires CGA guarding and BUE support. Round 1:  in // bars, placed 6 pods in horizontal line on purple balance beam and cued pt to side step and tap pods w/BLEs.  21 hits. Round 2:  same setup.  22 hits. Round 3:  same setup.  15 hits. Notable errors/deficits: Pt performed w/significant forward flexed posture, relying heavily on holding onto rail to hold himself up despite max cues from therapist and wife. Pt unable to follow cues to facilitate upright posture. Pt stated activity was hard, but  was unable to specify why.   Pt performed sit > supine on mat table w/min A for proper alignment, as pt laid back w/feet dangling off mat and could not lay in alignment despite cues from therapist and wife. Once supine, pt performed x15 reps of glute bridges w/mod A to stabilize LLE for improved posterior chain strength and facilitation of upright posture. Added to HEP (see bolded below) and educated pt and wife on how to perform at home.    PATIENT EDUCATION: Education details: updates to HEP  [place] educated: Patient and Spouse Education method: Explanation, Demonstration, Actor cues, Verbal cues, and Handouts Education comprehension: verbalized understanding, returned demonstration, verbal cues required, tactile cues required, and needs further education  HOME EXERCISE PROGRAM: Access Code: [email or web address] URL: [email or web address] Date: 08/31/2024 Prepared by: Marlon  Matthews Franks  Program Notes You Can Walk For A Certain Length Of Time Each Day                         Walk 5 minutes 2 times per day.           Increase 1-2  minutes every 7 days             Work up to 30 minutes (1-2 times per day).  Exercises - Side Lunge with Counter Support  - 1 x daily - 7 x weekly - 2 sets - 10 reps - Mini Squat with Counter Support  - 1 x daily - 7 x weekly - 2 sets - 10 reps - Standing Hip Abduction with Counter Support  - 1 x daily - 7 x weekly - 2-3 sets - 10 reps - Standing Hip Extension with Counter Support  - 1 x daily - 7 x weekly - 2-3 sets - 10 reps - Heel Raises with Counter Support  - 1 x daily - 7 x weekly - 2-3 sets - 10 reps - Sit to Stand with Armchair  - 1 x daily - 7 x weekly - 3 sets - 10 reps - Proper Sit to Stand Technique  - 1 x daily - 7 x weekly - 2 sets - 8-10 reps - Supine Bridge  - 1 x daily - 7 x weekly - 2-3 sets - 10-15 reps               GOALS: Goals reviewed with patient? Yes  SHORT TERM GOALS: Target date: 09/21/2024   Pt will be consistent with initial HEP and walking program for improved strength, balance, transfers and gait. Baseline: Goal status: INITIAL  2.  Pt will improve 5 x STS to less than or equal to 35 seconds to demonstrate improved functional strength and transfer efficiency.   Baseline: 41.4s w/BUE support  Goal status: INITIAL  3.  Pt will improve gait velocity to at least 0.55 m/s w/LRAD and SBA for improved gait efficiency and reduced fall risk   Baseline: 0.34m/s w/RW  Goal status: INITIAL  4.  Pt will ascend/descend 12 steps w/single rail on R and step-to pattern w/SBA for improved safety at home  Baseline:  Goal status: INITIAL  5.  Pt will perform floor transfer w/mod A for fall recovery and safety at home  Baseline:  Goal status: INITIAL   LONG TERM GOALS: Target date: 10/19/2024   Pt will be consistent with final HEP and walking program for improved strength, balance, transfers and  gait. Baseline:  Goal status: INITIAL  2.  Pt will improve 5 x  STS to less than or equal to 28 seconds to demonstrate improved functional strength and transfer efficiency.   Baseline: 41.4s w/BUE support  Goal status: INITIAL  3.  Pt will improve gait velocity to at least 0.65 m/s w/LRAD mod I for improved gait efficiency and independence   Baseline: 0.4 m/s w/RW  Goal status: INITIAL  4.  Pt will perform floor transfer w/min A for fall recovery and safety at home  Baseline:  Goal status: INITIAL   ASSESSMENT:  CLINICAL IMPRESSION: Emphasis of skilled PT session on endurance, postural control, LE coordination, visual scanning and posterior chain strength. Pt reports he has not been compliant w/HEP, but has been walking more. Pt w/repetitive lip-smacking during session, not noted on eval or previous treatment sessions, but wife reported this being normal for pt. Pt unable to maintain upright posture during blaze pod activity despite max multimodal cues, demonstrating significant reliance on BUE support on // bar and resting buttocks on rail behind him. Added glute bridges to HEP to work on hip extension strength at home and pt required mod A to stabilize LLE, which wife reported good understanding of how to replicate at home. Continue POC.    OBJECTIVE IMPAIRMENTS: Abnormal gait, decreased activity tolerance, decreased balance, decreased cognition, decreased coordination, decreased endurance, decreased knowledge of condition, decreased knowledge of use of DME, decreased mobility, difficulty walking, decreased strength, decreased safety awareness, increased edema, impaired perceived functional ability, impaired sensation, impaired tone, impaired vision/preception, and improper body mechanics.   ACTIVITY LIMITATIONS: carrying, lifting, bending, standing, squatting, stairs, transfers, reach over head, hygiene/grooming, locomotion level, and caring for others  PARTICIPATION LIMITATIONS: meal  prep, cleaning, laundry, medication management, personal finances, interpersonal relationship, driving, shopping, community activity, and yard work  PERSONAL FACTORS: Fitness, Past/current experiences, and 1-2 comorbidities: CVA and aneurysm are also affecting patient's functional outcome.   REHAB POTENTIAL: Good  CLINICAL DECISION MAKING: Evolving/moderate complexity  EVALUATION COMPLEXITY: Moderate  PLAN:  PT FREQUENCY: 2x/week  PT DURATION: 8 weeks  PLANNED INTERVENTIONS: 97164- PT Re-evaluation, 97750- Physical Performance Testing, 97110-Therapeutic exercises, 97530- Therapeutic activity, 97112- Neuromuscular re-education, 97535- Self Care, [id number]- Manual therapy, [phone]- Gait training, [phone]- Aquatic Therapy, [phone]- Electrical stimulation (manual), Patient/Family education, Balance training, Stair training, Taping, Joint mobilization, Spinal mobilization, Vestibular training, Visual/preceptual remediation/compensation, and DME instructions  PLAN FOR NEXT SESSION: Assess vitals. Start on SciFit. Add to walking program and HEP for improved BLE strength (L glute med strength, safety w/RW). Stairs, floor transfers, endurance, toe taps to 4-6 step, hip extension, visual scanning to L side, sidestepping, progression of step ups, work on upright posture  Has 2 and 5# dumbbell type weights at home.    Gaynelle Pastrana E Ehan Freas, PT, DPT  09/11/2024, 2:02 PM        "

## 2024-09-11 NOTE — Therapy (Signed)
 " OUTPATIENT SPEECH LANGUAGE PATHOLOGY EVALUATION   Patient Name: [name] [name] MRN: [id number] DOB:Feb 04, 1946, 79 y.o., male Today's Date: 09/11/2024  PCP: [name], MD REFERRING PROVIDER: Carilyn Prentice BRAVO, MD  END OF SESSION:  End of Session - 09/11/24 1450     Visit Number 1    Number of Visits 9    Date for Recertification  11/06/24    Authorization Type [employer] Medicare    SLP Start Time 1403    SLP Stop Time  1446    SLP Time Calculation (min) 43 min    Activity Tolerance Patient tolerated treatment well          Past Medical History:  Diagnosis Date   Chronic back pain    stenosis   Diabetes mellitus without complication (HCC)    Enlarged prostate    GERD (gastroesophageal reflux disease)    occasionally will take a zantac(maybe once a month)   History of colon polyps    History of kidney stones    History of stress test    done 10 yrs. ago, as a baseline    Hyperlipidemia    takes Crestor  daily   Hypertension    takes Amlodipine  daily and Lotensin  as well   Stroke [facility])    Past Surgical History:  Procedure Laterality Date   ANTERIOR LAT LUMBAR FUSION Left 12/15/2014   Procedure: ANTERIOR LATERAL LUMBAR FUSION 1 LEVEL;  Surgeon: Oneil Priestly, MD;  Location: [facility] OR;  Service: Orthopedics;  Laterality: Left;  Left sided lateral lumbar interbody fusion, lumbar 3-4, posterior spinal fusion, lumbar 3-4 with instrumentation.   COLONOSCOPY     ESOPHAGOGASTRODUODENOSCOPY     fatty tissue removed from stomach     LUMBAR LAMINECTOMY/DECOMPRESSION MICRODISCECTOMY N/A 07/08/2013   Procedure: LUMBAR LAMINECTOMY/DECOMPRESSION MICRODISCECTOMY;  Surgeon: Oneil Rodgers Priestly, MD;  Location: [facility] OR;  Service: Orthopedics;  Laterality: N/A;  Lumbar 3-4, lumbar 4-5 decompression   RADIOLOGY WITH ANESTHESIA N/A 10/22/2019   Procedure: MRI WITH ANESTHESIA OF LUMBAR SPINE WITHOUT CONTRAST;  Surgeon: Radiologist, Medication, MD;  Location: [facility] OR;  Service: Radiology;   Laterality: N/A;   right ankle surgery     as child    THORACIC AORTIC ENDOVASCULAR STENT GRAFT N/A 10/15/2019   Procedure: THORACIC AORTIC ENDOVASCULAR STENT GRAFT;  Surgeon: Sheree Penne Bruckner, MD;  Location: [facility] OR;  Service: Vascular;  Laterality: N/A;   THORACOABDOMINAL AORTIC ANEURYSM REPAIR Bilateral 05/08/2024   Procedure: REPAIR, ANEURYSM, AORTA, THORACOABDOMINAL;  Surgeon: Sheree Penne Bruckner, MD;  Location: [facility] OR;  Service: Vascular;  Laterality: Bilateral;   TONSILLECTOMY     as a child   ULTRASOUND GUIDANCE FOR VASCULAR ACCESS Bilateral 05/08/2024   Procedure: ULTRASOUND GUIDANCE, FOR VASCULAR ACCESS;  Surgeon: Sheree Penne Bruckner, MD;  Location: [facility] OR;  Service: Vascular;  Laterality: Bilateral;   Patient Active Problem List   Diagnosis Date Noted   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) 05/23/2024   Stroke (cerebrum) (HCC) 05/17/2024   Status post abdominal aortic aneurysm (AAA) repair 05/08/2024   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   Acute encephalopathy 03/01/2024   Allergic rhinitis 09/21/2020   Benign prostatic hyperplasia 09/21/2020   Body mass index (BMI) 31.0-31.9, adult 09/21/2020   Cerebral infarction (HCC) 09/21/2020   Diabetic foot (HCC) 09/21/2020   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   Dysarthria 09/21/2020   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   Gait difficulty 09/21/2020   History of psychiatric disorder 09/21/2020   History of colonic polyps 09/21/2020  Pure hypercholesterolemia 09/21/2020   Unspecified thoracic, thoracolumbar and lumbosacral intervertebral disc disorder 09/21/2020   Hypertension    Hyperlipidemia    GERD (gastroesophageal reflux disease)    DM (diabetes mellitus) (HCC)    CKD (chronic kidney disease), stage III (HCC)    Normocytic anemia    Thrombocytopenia    UTI (urinary tract infection)    AMS (altered mental status)    Cerebellar cerebrovascular accident (CVA)  without late effect 10/24/2019   History of repair of dissecting thoracic aneurysm    Abdominal distension    Pulmonary embolus (HCC) 10/19/2019   CVA (cerebral vascular accident) (HCC) 10/19/2019   AKI (acute kidney injury) 10/19/2019   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 Pt accompanied by: self and significant other  PERTINENT HISTORY: From CIR SLP d/c summary (October, 2025), pt has made good gains and has met 4 of 5 LTG's this admission due to improved cognition. Pt is currently an overall supervision-minA for cognitive tasks including short term memory, problem solving, attention and auditory comprehension. Patient did not meet verbal thought formulation goal due to continuing to require minA to organize thoughts during complex conversations. Pt/family education complete and pt will discharge home with 24 hour supervision from friends/family/etc. Pt would benefit from f/u ST services to maximize cognition in order to maximize functional independence.   Pt presents with c/o increased difficulty with cognitive-communication skills during ADLs. Pt's wife provided supplementary history. She notes that pt has increased word-finding difficulties since his stroke. Pt has difficulty with elaborating his thoughts in sentences, especially during conversations on the phone. Pt has prior hx of cognitive deficits, including short-term  memory impairment. Pt received OP ST services for cognitive-communication deficits back in 2021.   PAIN:  Are you having pain? No  FALLS: Has patient fallen in last 6 months?  See PT evaluation for details  LIVING ENVIRONMENT: Lives with: lives with their family and lives with their spouse Lives in: House/apartment  PLOF:  Level of assistance: Independent with ADLs Employment: Retired  PATIENT GOALS: To improve word-finding during conversations.  OBJECTIVE:  Note: Objective measures were completed at Evaluation unless otherwise noted.  DIAGNOSTIC FINDINGS: EXAM: MRI HEAD WITHOUT AND WITH CONTRAST   TECHNIQUE: Multiplanar, multiecho pulse sequences of the brain and surrounding structures were obtained without and with intravenous contrast.   CONTRAST:  8mL GADAVIST  GADOBUTROL  1 MMOL/ML IV SOLN   COMPARISON:  Comparison made with prior CT and MRI from 02/29/2024   FINDINGS: Brain: Diffuse prominence of the CSF containing spaces compatible generalized cerebral atrophy. Patchy T2/FLAIR hyperintensity involving the periventricular deep white matter both cerebral hemispheres, consistent chronic small vessel ischemic disease, moderate in nature. Few scattered remote lacunar infarcts present about the periventricular white matter, with involvement of the corpus callosum. Few small remote cortical to subcortical bilateral occipital infarcts noted. Associated mild chronic hemosiderin staining at the right occipital lobe. Few chronic micro hemorrhages noted within the right occipital and temporal regions as well, likely hypertensive in  nature.   9 mm focus of restricted diffusion seen involving the right cerebellar hemisphere, consistent with a small acute infarct (series 5, image 61). No associated hemorrhage or mass effect. No other evidence for acute or subacute ischemia. Gray-white matter differentiation otherwise maintained. No acute intracranial hemorrhage.   No mass  lesion, midline shift or mass effect. Ventricular prominence related global parenchymal volume loss without hydrocephalus. No extra-axial fluid collection. Pituitary gland and suprasellar region within normal limits.   No abnormal enhancement.   Vascular: Major intracranial vascular flow voids are maintained.   Skull and upper cervical spine: Cranial junction with normal limits. Bone marrow signal intensity overall within normal limits. No scalp soft tissue abnormality.   Sinuses/Orbits: Prior bilateral ocular lens replacement. Paranasal sinuses are largely clear. No significant mastoid effusion.   Other: None.   IMPRESSION: 1. 9 mm acute ischemic nonhemorrhagic right cerebellar infarct. 2. Underlying age-related cerebral atrophy with moderate chronic microvascular ischemic disease, with a few scattered remote lacunar infarcts about the periventricular white matter and corpus callosum. 3. Few small remote cortical to subcortical bilateral occipital infarcts.  COGNITION: Overall cognitive status: History of cognitive impairments - at baseline Areas of impairment:  Attention: Impaired: Sustained, Selective, Alternating Memory: Impaired: Working It Consultant function: Impaired: Problem solving, Organization, and Slow processing Functional deficits: Pt forgets things said during conversations.   COGNITIVE COMMUNICATION: Following directions: Follows multi-step commands with increased time  Auditory comprehension: WFL Verbal expression: Impaired: Word-finding difficulties. Functional communication: WFL  ORAL MOTOR EXAMINATION: Overall status: Did not assess Comments: N/A  STANDARDIZED ASSESSMENTS: CLQT: Clock Drawing: Mild Other portions of CLQT will be completed in upcoming session.  PATIENT REPORTED OUTCOME MEASURES (PROM): Cognitive function: Short Form: 26/40 Pt scored 2 or Often (once a day) on My thinking is slow. And I had to work really hard to pay  attention or I would make a mistake.                                                                                                                            TREATMENT DATE:   09/11/24: Evaluation in progress. No tx conducted during this session.    PATIENT EDUCATION: Education details: Evaluation results and treatment planning. [place] educated: Patient and Spouse Education method: Explanation Education comprehension: verbalized understanding and needs further education   GOALS: Goals reviewed with patient? Yes  SHORT TERM GOALS: Target date: 10/09/24  Pt will complete CLQT. Baseline: Goal status: INITIAL  2.  Pt will utilize word-finding strategies during a >8 minute conversation to minimize anomic pauses given occasional mod to min A. Baseline:  Goal status: INITIAL  3.  Pt and/or pt's wife will demonstrate communication supports to maximize pt's auditory processing and support pt's short-term memory at home across 2 sessions given occasional min A.  Baseline:  Goal status: INITIAL  4.  Pt will adhere to HEP across 3 sessions given occasional min A. Baseline:  Goal status: INITIAL  5.  TBD Baseline:  Goal status: INITIAL  6.  TBD Baseline:  Goal status: INITIAL  LONG TERM GOALS: Target date: 11/06/24  Pt will independently utilize word-finding strategies to participate in a >12 minute conversation with 1 or less anomic pauses.  Baseline:  Goal status: INITIAL  2.  Pt and pt's family will demonstrate use of communication supports to maximize pt's auditory processing of complex information during conversations. Baseline:  Goal status: INITIAL  3.  Pt will independently utilize external memory supports to optimize short-term memory recall during conversations and ADLs across 3 sessions. Baseline:  Goal status: INITIAL  4.  TBD Baseline:  Goal status: INITIAL  5.  TBD Baseline:  Goal status: INITIAL  6.  TBD Baseline:  Goal status:  INITIAL  ASSESSMENT:  CLINICAL IMPRESSION: Patient is a 79 y.o. M who was seen today for cognitive-communication evaluation s/p R cerebellar CVA. Pt presents with mild-moderate cognitive-communication deficits characterized by short-term memory impairment, attention deficits, and executive dysfuction. In addition, pt presents with reports of word-finding difficulties. Pt has a hx of mild cognitive-communication deficits prior to recent CVA. Pt's cognitive-communication deficits impact his ability to participate in conversations and communicating on the phone. Skilled ST services are recommended to optimize pt's independence with ADLs and communication through the implementation of new cognitive-communication support systems and word-finding strategies. Without ST services, pt is at risk of increasing social isolation and decreased participation in ADLs, which negatively impacts QOL.  OBJECTIVE IMPAIRMENTS: include memory, executive functioning, and expressive language. These impairments are limiting patient from ADLs/IADLs and effectively communicating at home and in community. Factors affecting potential to achieve goals and functional outcome are ability to learn/carryover information.. Patient will benefit from skilled SLP services to address above impairments and improve overall function.  REHAB POTENTIAL: Good  PLAN:  SLP FREQUENCY: 1x/week  SLP DURATION: 8 weeks  PLANNED INTERVENTIONS: Environmental controls, Cognitive reorganization, Internal/external aids, Functional tasks, SLP instruction and feedback, Compensatory strategies, Patient/family education, and [id number] Treatment of speech (30 or 45 min)     Waddell Music, M.A., CF-SLP 09/11/2024, 4:22 PM      "

## 2024-09-15 ENCOUNTER — Ambulatory Visit: Admitting: Physical Therapy

## 2024-09-18 ENCOUNTER — Ambulatory Visit: Admitting: Physical Therapy

## 2024-09-22 ENCOUNTER — Ambulatory Visit: Admitting: Physical Therapy

## 2024-09-25 ENCOUNTER — Ambulatory Visit: Admitting: Physical Therapy

## 2024-09-25 ENCOUNTER — Ambulatory Visit

## 2024-09-25 VITALS — BP 149/76 | HR 59

## 2024-09-25 DIAGNOSIS — R2689 Other abnormalities of gait and mobility: Secondary | ICD-10-CM

## 2024-09-25 DIAGNOSIS — R41841 Cognitive communication deficit: Secondary | ICD-10-CM

## 2024-09-25 DIAGNOSIS — R2681 Unsteadiness on feet: Secondary | ICD-10-CM

## 2024-09-25 DIAGNOSIS — M6281 Muscle weakness (generalized): Secondary | ICD-10-CM

## 2024-09-25 DIAGNOSIS — Z9181 History of falling: Secondary | ICD-10-CM

## 2024-09-25 DIAGNOSIS — R41840 Attention and concentration deficit: Secondary | ICD-10-CM

## 2024-09-25 NOTE — Patient Instructions (Signed)
 Memory Compensation Strategies  Use WARM strategy. W= write it down A=  associate it R=  repeat it M=  make a mental picture  You can keep a Memory Notebook. Use a 3-ring notebook with sections for the following:  calendar, important names and phone numbers, medications, doctors names/phone numbers, to do list/reminders, and a section to journal what you did each day  Use a calendar to write appointments down.  Write yourself a schedule for the day.  This can be placed on the calendar or in a separate section of the Memory Notebook.  Keeping a regular schedule can help memory.  Use medication organizer with sections for each day or morning/evening pills  You may need help loading it  Keep a basket, or pegboard by the door.   Place items that you need to take out with you in the basket or on the pegboard.  You may also want to include a message board for reminders.  Use sticky notes. Place sticky notes with reminders in a place where the task is performed.  For example:  turn off the stove placed by the stove, lock the door placed on the door at eye level, take your medications on the bathroom mirror or by the place where you normally take your medications  Use alarms, timers, and/or a reminder app. Use while cooking to remind yourself to check on food or as a reminder to take your medicine, or as a reminder to make a call, or as a reminder to perform another task, etc.  Use a voice recorder app or small tape recorder to record important information and notes for yourself. Go back at the end of the day and listen to these.  10. Word-finding strategy -  A. Use descriptions to help find a word. 11. Naming members of a category  A. Set a time limit.   B. For example, name as many family members in 5 minutes.  C. Aim for 7-10 items per category.

## 2024-09-25 NOTE — Therapy (Signed)
 " OUTPATIENT SPEECH LANGUAGE PATHOLOGY TREATMENT   Patient Name: [name] [name] MRN: [id number] DOB:[birth year], 79 y.o., male Today's Date: 09/25/2024  PCP: [name], MD REFERRING PROVIDER: Carilyn Prentice BRAVO, MD  END OF SESSION:  End of Session - 09/25/24 1413     Visit Number 2    Number of Visits 9    Date for Recertification  11/06/24    Authorization Type [employer]    Authorization - Visit Number 1    Authorization - Number of Visits 6    SLP Start Time 1321    SLP Stop Time  1400    SLP Time Calculation (min) 39 min    Activity Tolerance Patient tolerated treatment well           Past Medical History:  Diagnosis Date   Chronic back pain    stenosis   Diabetes mellitus without complication (HCC)    Enlarged prostate    GERD (gastroesophageal reflux disease)    occasionally will take a zantac(maybe once a month)   History of colon polyps    History of kidney stones    History of stress test    done 10 yrs. ago, as a baseline    Hyperlipidemia    takes Crestor  daily   Hypertension    takes Amlodipine  daily and Lotensin  as well   Stroke [facility])    Past Surgical History:  Procedure Laterality Date   ANTERIOR LAT LUMBAR FUSION Left 12/15/2014   Procedure: ANTERIOR LATERAL LUMBAR FUSION 1 LEVEL;  Surgeon: Oneil Priestly, MD;  Location: [facility] OR;  Service: Orthopedics;  Laterality: Left;  Left sided lateral lumbar interbody fusion, lumbar 3-4, posterior spinal fusion, lumbar 3-4 with instrumentation.   COLONOSCOPY     ESOPHAGOGASTRODUODENOSCOPY     fatty tissue removed from stomach     LUMBAR LAMINECTOMY/DECOMPRESSION MICRODISCECTOMY N/A 07/08/2013   Procedure: LUMBAR LAMINECTOMY/DECOMPRESSION MICRODISCECTOMY;  Surgeon: Oneil Rodgers Priestly, MD;  Location: [facility] OR;  Service: Orthopedics;  Laterality: N/A;  Lumbar 3-4, lumbar 4-5 decompression   RADIOLOGY WITH ANESTHESIA N/A 10/22/2019   Procedure: MRI WITH ANESTHESIA OF LUMBAR SPINE WITHOUT CONTRAST;  Surgeon:  Radiologist, Medication, MD;  Location: [facility] OR;  Service: Radiology;  Laterality: N/A;   right ankle surgery     as child    THORACIC AORTIC ENDOVASCULAR STENT GRAFT N/A 10/15/2019   Procedure: THORACIC AORTIC ENDOVASCULAR STENT GRAFT;  Surgeon: Sheree Penne Bruckner, MD;  Location: [facility] OR;  Service: Vascular;  Laterality: N/A;   THORACOABDOMINAL AORTIC ANEURYSM REPAIR Bilateral 05/08/2024   Procedure: REPAIR, ANEURYSM, AORTA, THORACOABDOMINAL;  Surgeon: Sheree Penne Bruckner, MD;  Location: [facility] OR;  Service: Vascular;  Laterality: Bilateral;   TONSILLECTOMY     as a child   ULTRASOUND GUIDANCE FOR VASCULAR ACCESS Bilateral 05/08/2024   Procedure: ULTRASOUND GUIDANCE, FOR VASCULAR ACCESS;  Surgeon: Sheree Penne Bruckner, MD;  Location: [facility] OR;  Service: Vascular;  Laterality: Bilateral;   Patient Active Problem List   Diagnosis Date Noted   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) 05/23/2024   Stroke (cerebrum) (HCC) 05/17/2024   Status post abdominal aortic aneurysm (AAA) repair 05/08/2024   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   Acute encephalopathy 03/01/2024   Allergic rhinitis 09/21/2020   Benign prostatic hyperplasia 09/21/2020   Body mass index (BMI) 31.0-31.9, adult 09/21/2020   Cerebral infarction (HCC) 09/21/2020   Diabetic foot (HCC) 09/21/2020   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   Dysarthria 09/21/2020   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  Gait difficulty 09/21/2020   History of psychiatric disorder 09/21/2020   History of colonic polyps 09/21/2020   Pure hypercholesterolemia 09/21/2020   Unspecified thoracic, thoracolumbar and lumbosacral intervertebral disc disorder 09/21/2020   Hypertension    Hyperlipidemia    GERD (gastroesophageal reflux disease)    DM (diabetes mellitus) (HCC)    CKD (chronic kidney disease), stage III (HCC)    Normocytic anemia    Thrombocytopenia    UTI (urinary tract infection)    AMS  (altered mental status)    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   History of repair of dissecting thoracic aneurysm    Abdominal distension    Pulmonary embolus (HCC) 10/19/2019   CVA (cerebral vascular accident) (HCC) 10/19/2019   AKI (acute kidney injury) 10/19/2019   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 Pt accompanied by: self and significant other  PERTINENT HISTORY: From CIR SLP d/c summary (October, 2025), pt has made good gains and has met 4 of 5 LTG's this admission due to improved cognition. Pt is currently an overall supervision-minA for cognitive tasks including short term memory, problem solving, attention and auditory comprehension. Patient did not meet verbal thought formulation goal due to continuing to require minA to organize thoughts during complex conversations. Pt/family education complete and pt will discharge home with 24 hour supervision from friends/family/etc. Pt would benefit from f/u ST services to maximize cognition in order to maximize functional independence.   Pt presents with c/o increased difficulty with cognitive-communication skills during ADLs. Pt's wife provided supplementary history. She notes that pt has increased word-finding difficulties since his stroke. Pt has difficulty with elaborating his thoughts in sentences, especially during conversations on the  phone. Pt has prior hx of cognitive deficits, including short-term memory impairment. Pt received OP ST services for cognitive-communication deficits back in 2021.   PAIN:  Are you having pain? No  FALLS: Has patient fallen in last 6 months?  See PT evaluation for details  LIVING ENVIRONMENT: Lives with: lives with their family and lives with their spouse Lives in: House/apartment  PLOF:  Level of assistance: Independent with ADLs Employment: Retired  PATIENT GOALS: To improve word-finding during conversations.  OBJECTIVE:  Note: Objective measures were completed at Evaluation unless otherwise noted.  DIAGNOSTIC FINDINGS: EXAM: MRI HEAD WITHOUT AND WITH CONTRAST   TECHNIQUE: Multiplanar, multiecho pulse sequences of the brain and surrounding structures were obtained without and with intravenous contrast.   CONTRAST:  8mL GADAVIST  GADOBUTROL  1 MMOL/ML IV SOLN   COMPARISON:  Comparison made with prior CT and MRI from 02/29/2024   FINDINGS: Brain: Diffuse prominence of the CSF containing spaces compatible generalized cerebral atrophy. Patchy T2/FLAIR hyperintensity involving the periventricular deep white matter both cerebral hemispheres, consistent chronic small vessel ischemic disease, moderate in nature. Few scattered remote lacunar infarcts present about the periventricular white matter, with involvement of the corpus callosum. Few small remote cortical to subcortical bilateral occipital infarcts noted. Associated mild chronic hemosiderin staining at the right  occipital lobe. Few chronic micro hemorrhages noted within the right occipital and temporal regions as well, likely hypertensive in nature.   9 mm focus of restricted diffusion seen involving the right cerebellar hemisphere, consistent with a small acute infarct (series 5, image 61). No associated hemorrhage or mass effect. No other evidence for acute or subacute ischemia. Gray-white matter differentiation  otherwise maintained. No acute intracranial hemorrhage.   No mass lesion, midline shift or mass effect. Ventricular prominence related global parenchymal volume loss without hydrocephalus. No extra-axial fluid collection. Pituitary gland and suprasellar region within normal limits.   No abnormal enhancement.   Vascular: Major intracranial vascular flow voids are maintained.   Skull and upper cervical spine: Cranial junction with normal limits. Bone marrow signal intensity overall within normal limits. No scalp soft tissue abnormality.   Sinuses/Orbits: Prior bilateral ocular lens replacement. Paranasal sinuses are largely clear. No significant mastoid effusion.   Other: None.   IMPRESSION: 1. 9 mm acute ischemic nonhemorrhagic right cerebellar infarct. 2. Underlying age-related cerebral atrophy with moderate chronic microvascular ischemic disease, with a few scattered remote lacunar infarcts about the periventricular white matter and corpus callosum. 3. Few small remote cortical to subcortical bilateral occipital infarcts.  COGNITION: Overall cognitive status: History of cognitive impairments - at baseline Areas of impairment:  Attention: Impaired: Sustained, Selective, Alternating Memory: Impaired: Working It Consultant function: Impaired: Problem solving, Organization, and Slow processing Functional deficits: Pt forgets things said during conversations.   COGNITIVE COMMUNICATION: Following directions: Follows multi-step commands with increased time  Auditory comprehension: WFL Verbal expression: Impaired: Word-finding difficulties. Functional communication: WFL  ORAL MOTOR EXAMINATION: Overall status: Did not assess Comments: N/A  STANDARDIZED ASSESSMENTS: CLQT: Clock Drawing: Mild Other portions of CLQT will be completed in upcoming session.  PATIENT REPORTED OUTCOME MEASURES (PROM): Cognitive function: Short Form: 26/40 Pt scored 2 or Often (once a  day) on My thinking is slow. And I had to work really hard to pay attention or I would make a mistake.                                                                                                                            TREATMENT DATE:   09/25/24: Pt's wife was present for session. Pt completed CLQT. SLP introduced word-finding and memory strategies for optimizing pt's short-term memory and communicative effectiveness. SLP instructed pt in utilizing descriptions as a word-finding strategy during conversations. SLP also instructed pt in using divergent naming as a task for optimizing word recall for short-term memory. SLP educated pt in utilizing personally-relevant categories for maximizing word recall important for pt on a daily basis. Pt and pt's wife was agreeable to trying strategies for word-finding and short-term memory recall. Pt notes that he does not do anything for fun at home. SLP educated pt in importance of trying new activities for cognitive stimulation. SLP encouraged pt to try to complete 2 word, number, or other puzzles/day to increase cognitive stimulation for  cognitive health. Pt is agreeable. SLP provided pt and pt's wife with a sheet focused on external/internal memory strategies for optimizing memory at home and during ADLs. Plan is to continue instruction in word-finding strategies and memory strategies.   09/11/24: Evaluation in progress. No tx conducted during this session.    PATIENT EDUCATION: Education details: Evaluation results and treatment planning. Paulette educated: Patient and Spouse Education method: Explanation Education comprehension: verbalized understanding and needs further education   GOALS: Goals reviewed with patient? Yes  SHORT TERM GOALS: Target date: 10/09/24  Pt will complete CLQT. Baseline: Goal status: MET  2.  Pt will utilize word-finding strategies during a >8 minute conversation to minimize anomic pauses given occasional mod to  min A. Baseline:  Goal status: ONGOING  3.  Pt and/or pt's wife will demonstrate communication supports to maximize pt's auditory processing and support pt's short-term memory at home across 2 sessions given occasional min A.  Baseline:  Goal status: ONGOING  4.  Pt will adhere to HEP across 3 sessions given occasional min A. Baseline:  Goal status: ONGOING  5.  Pt will utilize at least 2 external memory supports to increase short-term memory recall during daily activities given occasional min A.  Baseline:  Goal status: NEW  6.  TBD Baseline:  Goal status: INITIAL  LONG TERM GOALS: Target date: 11/06/24  Pt will independently utilize word-finding strategies to participate in a >12 minute conversation with 1 or less anomic pauses.  Baseline:  Goal status: ONGOING  2.  Pt and pt's family will demonstrate use of communication supports to maximize pt's auditory processing of complex information during conversations. Baseline:  Goal status: ONGOING  3.  Pt will independently utilize external memory supports to optimize short-term memory recall during conversations and ADLs across 3 sessions. Baseline:  Goal status: ONGOING  4.  TBD Baseline:  Goal status: INITIAL  5.  TBD Baseline:  Goal status: INITIAL  6.  TBD Baseline:  Goal status: INITIAL  ASSESSMENT:  CLINICAL IMPRESSION: Patient is a 79 y.o. M who was seen today for cognitive-communication evaluation s/p R cerebellar CVA. Pt presents with mild-moderate cognitive-communication deficits characterized by short-term memory impairment, attention deficits, and executive dysfuction. In addition, pt presents with reports of word-finding difficulties. Pt has a hx of mild cognitive-communication deficits prior to recent CVA. Pt's cognitive-communication deficits impact his ability to participate in conversations and communicating on the phone. Skilled ST services are recommended to optimize pt's independence with ADLs and  communication through the implementation of new cognitive-communication support systems and word-finding strategies. Without ST services, pt is at risk of increasing social isolation and decreased participation in ADLs, which negatively impacts QOL.  OBJECTIVE IMPAIRMENTS: include memory, executive functioning, and expressive language. These impairments are limiting patient from ADLs/IADLs and effectively communicating at home and in community. Factors affecting potential to achieve goals and functional outcome are ability to learn/carryover information.. Patient will benefit from skilled SLP services to address above impairments and improve overall function.  REHAB POTENTIAL: Good  PLAN:  SLP FREQUENCY: 1x/week  SLP DURATION: 8 weeks  PLANNED INTERVENTIONS: Environmental controls, Cognitive reorganization, Internal/external aids, Functional tasks, SLP instruction and feedback, Compensatory strategies, Patient/family education, and [id number] Treatment of speech (30 or 45 min)     Waddell Music, M.A., CF-SLP 09/25/2024, 2:15 PM      "

## 2024-09-25 NOTE — Therapy (Signed)
 " OUTPATIENT PHYSICAL THERAPY NEURO TREATMENT   Patient Name: [name] [name] MRN: [id number] DOB:[birth year], 79 y.o., male Today's Date: 09/25/2024   PCP: [name] Other, MD REFERRING PROVIDER: Carilyn Prentice BRAVO, MD  END OF SESSION:  PT End of Session - 09/25/24 1404     Visit Number 5    Number of Visits 17    Date for Recertification  11/02/24    Authorization Type [employer] Medicare    PT Start Time 1402   Handoff w/SLP   PT Stop Time 1443    PT Time Calculation (min) 41 min    Equipment Utilized During Treatment Gait belt    Activity Tolerance Patient tolerated treatment well    Behavior During Therapy WFL for tasks assessed/performed             Past Medical History:  Diagnosis Date   Chronic back pain    stenosis   Diabetes mellitus without complication (HCC)    Enlarged prostate    GERD (gastroesophageal reflux disease)    occasionally will take a zantac(maybe once a month)   History of colon polyps    History of kidney stones    History of stress test    done 10 yrs. ago, as a baseline    Hyperlipidemia    takes Crestor  daily   Hypertension    takes Amlodipine  daily and Lotensin  as well   Stroke [facility])    Past Surgical History:  Procedure Laterality Date   ANTERIOR LAT LUMBAR FUSION Left 12/15/2014   Procedure: ANTERIOR LATERAL LUMBAR FUSION 1 LEVEL;  Surgeon: [name] Priestly, MD;  Location: [facility] OR;  Service: Orthopedics;  Laterality: Left;  Left sided lateral lumbar interbody fusion, lumbar 3-4, posterior spinal fusion, lumbar 3-4 with instrumentation.   COLONOSCOPY     ESOPHAGOGASTRODUODENOSCOPY     fatty tissue removed from stomach     LUMBAR LAMINECTOMY/DECOMPRESSION MICRODISCECTOMY N/A 07/08/2013   Procedure: LUMBAR LAMINECTOMY/DECOMPRESSION MICRODISCECTOMY;  Surgeon: [name] Rodgers Priestly, MD;  Location: [facility] OR;  Service: Orthopedics;  Laterality: N/A;  Lumbar 3-4, lumbar 4-5 decompression   RADIOLOGY WITH ANESTHESIA N/A 10/22/2019   Procedure: MRI WITH  ANESTHESIA OF LUMBAR SPINE WITHOUT CONTRAST;  Surgeon: Radiologist, Medication, MD;  Location: [facility] OR;  Service: Radiology;  Laterality: N/A;   right ankle surgery     as child    THORACIC AORTIC ENDOVASCULAR STENT GRAFT N/A 10/15/2019   Procedure: THORACIC AORTIC ENDOVASCULAR STENT GRAFT;  Surgeon: [name] Penne Bruckner, MD;  Location: [facility] OR;  Service: Vascular;  Laterality: N/A;   THORACOABDOMINAL AORTIC ANEURYSM REPAIR Bilateral 05/08/2024   Procedure: REPAIR, ANEURYSM, AORTA, THORACOABDOMINAL;  Surgeon: [name] Penne Bruckner, MD;  Location: [facility] OR;  Service: Vascular;  Laterality: Bilateral;   TONSILLECTOMY     as a child   ULTRASOUND GUIDANCE FOR VASCULAR ACCESS Bilateral 05/08/2024   Procedure: ULTRASOUND GUIDANCE, FOR VASCULAR ACCESS;  Surgeon: [name] Penne Bruckner, MD;  Location: [facility] OR;  Service: Vascular;  Laterality: Bilateral;   Patient Active Problem List   Diagnosis Date Noted   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) 05/23/2024   Stroke (cerebrum) (HCC) 05/17/2024   Status post abdominal aortic aneurysm (AAA) repair 05/08/2024   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   Acute encephalopathy 03/01/2024   Allergic rhinitis 09/21/2020   Benign prostatic hyperplasia 09/21/2020   Body mass index (BMI) 31.0-31.9, adult 09/21/2020   Cerebral infarction (HCC) 09/21/2020   Diabetic foot (HCC) 09/21/2020   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   Dysarthria 09/21/2020  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   Gait difficulty 09/21/2020   History of psychiatric disorder 09/21/2020   History of colonic polyps 09/21/2020   Pure hypercholesterolemia 09/21/2020   Unspecified thoracic, thoracolumbar and lumbosacral intervertebral disc disorder 09/21/2020   Hypertension    Hyperlipidemia    GERD (gastroesophageal reflux disease)    DM (diabetes mellitus) (HCC)    CKD (chronic kidney disease), stage III (HCC)    Normocytic anemia     Thrombocytopenia    UTI (urinary tract infection)    AMS (altered mental status)    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   History of repair of dissecting thoracic aneurysm    Abdominal distension    Pulmonary embolus (HCC) 10/19/2019   CVA (cerebral vascular accident) (HCC) 10/19/2019   AKI (acute kidney injury) 10/19/2019   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) 10/19/2019   Ileus (HCC) 10/19/2019   Dissection of thoracoabdominal aorta (HCC)    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   Radiculopathy 12/15/2014    ONSET DATE: 07/30/2024 (referral)   REFERRING DIAG: P36.458 (ICD-10-CM) - 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 States he has not been doing his HEP as much as he should be due to laziness. I want to be able to have sex more.   Pt accompanied by: [name] [name] - nickname Argyle   PERTINENT HISTORY: CVA (1st one in 2020), HTN, HLD, GERD, and diabetes. admitted to rehab 05/17/2024 following aneurysm repair   PAIN:  Are you having pain? No  PRECAUTIONS: Fall  RED FLAGS: None   WEIGHT BEARING RESTRICTIONS: No  FALLS: Has patient fallen in last 6 months? Yes. Number of falls 3  LIVING ENVIRONMENT: Lives with: lives with their spouse Lives in: House/apartment Stairs: Yes: Internal: 14 steps; on right going up and External: 1 in back  steps; none Has following equipment at home: Vannie - 2 wheeled, Environmental Consultant - 4 wheeled, Wheelchair (manual), shower chair, bed side commode, and Grab bars  PLOF: Independent, Leisure: Publishing copy and piano, and Pt was  not driving  PATIENT GOALS: I want to be able to make love again   OBJECTIVE:  Note: Objective measures were completed at Evaluation unless otherwise noted.  DIAGNOSTIC FINDINGS: CTA of chest/abd/pelv from 06/19/24 IMPRESSION: 1. Large filling defect in middle and lower lobe branches of the right pulmonary artery consistent with pulmonary embolus. This finding was discussed with Dr. Sheree at 3:07 pm on 06/19/2024. 2. 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 3. 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 4. 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 5. Moderate-sized right inguinal hernia containing loops of small bowel without obstruction. 6. Severely enlarged prostate gland with lobular extension into the urinary bladder.  CT of head from 05/16/24 IMPRESSION: CT HEAD:   1. Grossly stable appearance of previously identified small acute ischemic nonhemorrhagic right cerebellar infarct. 2. Age-related cerebral atrophy with mild chronic small vessel ischemic disease, with a few small remote occipital infarcts   CTA HEAD AND NECK:   1. Negative CTA for large vessel occlusion or [name] emergent finding. 2. Mild atheromatous change about the carotid bifurcations and carotid siphons without hemodynamically significant stenosis. 3. Diffuse tortuosity and ectasia of the major arterial vasculature of the head and neck, suggesting chronic underlying hypertension. 4. Previously noted attenuation of the proximal right CCA on  prior MRA was consistent with artifact. No stenosis in this region.   MRI of brain from 05/14/24 IMPRESSION: 1. 9 mm acute ischemic nonhemorrhagic right cerebellar infarct. 2. Underlying age-related cerebral atrophy with moderate chronic microvascular ischemic disease, with a few scattered remote lacunar infarcts about the periventricular white matter and corpus callosum. 3. Few small remote cortical to subcortical bilateral occipital infarcts.  MRA of head/neck from 05/15/24 IMPRESSION: MRA HEAD:   Negative intracranial MRA. No large vessel occlusion or hemodynamically significant stenosis. No aneurysm.   MRA NECK:   1. Somewhat technically limited exam due to motion and lack of IV contrast. 2. Asymmetric attenuation of the proximal right CCA. While this is suspected to be artifactual in nature as the vessel appears tortuous and ectatic at this level, a possible underlying stenosis is difficult to exclude given the technical limitations of this exam. Correlation with dedicated CTA could be performed for further evaluation as warranted. 3. Atheromatous change about the right carotid bulb without hemodynamically significant greater than 50% stenosis. 4. Wide patency of the left carotid artery system within the neck. 5. Wide patency of both vertebral arteries within the neck. Left vertebral artery dominant.  COGNITION: Overall cognitive status: Impaired: Areas of impairment:  Attention: Impaired: Selective, Alternating Memory: Impaired: Immediate Working It Consultant function: Impaired: Problem solving, Planning, Self-correction, and Slow processing    SENSATION: Pt reports bilateral numbness in feet    EDEMA: Chronic BLE edema    MUSCLE TONE: LLE: Hypotonic   POSTURE: rounded shoulders, forward head, increased thoracic kyphosis, and right pelvic obliquity  LOWER EXTREMITY ROM:    /  Active  Right Eval Left Eval  Hip flexion    Hip extension    Hip  abduction    Hip adduction    Hip internal rotation    Hip external rotation    Knee flexion    Knee extension    Ankle dorsiflexion    Ankle plantarflexion    Ankle inversion    Ankle  eversion     (Blank rows = not tested)  LOWER EXTREMITY MMT:  tested in seated position   MMT Right Eval Left Eval  Hip flexion 4- 3+  Hip extension    Hip abduction 4 4-  Hip adduction 4 3+  Hip internal rotation    Hip external rotation    Knee flexion 4- 3+  Knee extension 4 4  Ankle dorsiflexion 4 4-  Ankle plantarflexion    Ankle inversion    Ankle eversion    (Blank rows = not tested)  BED MOBILITY:  Not tested Independent per pt   TRANSFERS: Sit to stand: SBA  Assistive device utilized: Environmental Consultant - 2 wheeled     Stand to sit: SBA  Assistive device utilized: Environmental Consultant - 2 wheeled     Heavy reliance on BUE support w/poor eccentric control and decreased anterior weight shift   RAMP:  Not tested  CURB:  Not tested  STAIRS: Not tested GAIT: Gait pattern: step through pattern, decreased stride length, decreased ankle dorsiflexion- Right, decreased ankle dorsiflexion- Left, knee flexed in stance- Right, knee flexed in stance- Left, shuffling, ataxic, trendelenburg, lateral hip instability, trunk flexed, and narrow BOS Distance walked: Various clinic distances  Assistive device utilized: Environmental Consultant - 2 wheeled Level of assistance: SBA and CGA Comments: Pt w/inconsistent foot placement of LLE, significant Trendelenburg of LLE and maintained knee flexion bilaterally. Min cues to avoid hitting obstacles on L side throughout session and for upright posture.    FUNCTIONAL TESTS:   [facility] PT Assessment - 09/25/24 1424       Balance   Balance Assessed Yes      Standardized Balance Assessment   Standardized Balance Assessment 10 meter walk test;Five Times Sit to Stand    Five times sit to stand comments  32.84s w/BUE support    10 Meter Walk 0.5 m/s w/RW and SBA   23m over 19.72s w/RW            VITALS  Vitals:   09/25/24 1412  BP: (!) 149/76  Pulse: (!) 59                                                                                                                                  TREATMENT:   Self-care/home management  Discussed creating an exercise schedule to promote consistency w/HEP at home. Wife reports they will try to perform them in the AM rather than PM, as pt more energetic in the AM.  Assessed vitals in LUE (see above) and WNL for pt and session   Ther Act/Physical Performance/NMR  SciFit multi-peaks level 8.5 for 8 minutes using BUE/BLEs for neural priming for reciprocal movement, dynamic cardiovascular warmup and increased amplitude of stepping. Pt able to place feet in pedals independently. RPE of 5/10 following activity.   [facility] PT Assessment - 09/25/24 1424       Balance   Balance Assessed Yes  Standardized Balance Assessment   Standardized Balance Assessment 10 meter walk test;Five Times Sit to Stand    Five times sit to stand comments  32.84s w/BUE support    10 Meter Walk 0.5 m/s w/RW and SBA   35m over 19.72s w/RW        Pt assumed tall kneel position on mat table w/CGA and performed the following for improved hip extension/abduction, lateral weight shifting and postural control:  Mini squats, 2x10 reps w/BUE support on bench. Min cues for proper technique initially, but pt able to perform well w/full hip extension at top of rep.  Fire hydrants, x10 reps per side w/BUE support on bench. Pt very challenged by activity and required rest breaks in between reps due to cramping in L hamstring.    PATIENT EDUCATION: Education details: Continue HEP and create an exercise schedule. Bring RW next session  [place] educated: Patient and Spouse Education method: Explanation, Demonstration, Tactile cues, and Verbal cues Education comprehension: verbalized understanding, returned demonstration, verbal cues required, tactile cues required, and  needs further education  HOME EXERCISE PROGRAM: Access Code: [email or web address] URL: [email or web address] Date: 08/31/2024 Prepared by: Marlon Luka Stohr  Program Notes You Can Walk For A Certain Length Of Time Each Day                         Walk 5 minutes 2 times per day.           Increase 1-2  minutes every 7 days             Work up to 30 minutes (1-2 times per day).  Exercises - Side Lunge with Counter Support  - 1 x daily - 7 x weekly - 2 sets - 10 reps - Mini Squat with Counter Support  - 1 x daily - 7 x weekly - 2 sets - 10 reps - Standing Hip Abduction with Counter Support  - 1 x daily - 7 x weekly - 2-3 sets - 10 reps - Standing Hip Extension with Counter Support  - 1 x daily - 7 x weekly - 2-3 sets - 10 reps - Heel Raises with Counter Support  - 1 x daily - 7 x weekly - 2-3 sets - 10 reps - Sit to Stand with Armchair  - 1 x daily - 7 x weekly - 3 sets - 10 reps - Proper Sit to Stand Technique  - 1 x daily - 7 x weekly - 2 sets - 8-10 reps - Supine Bridge  - 1 x daily - 7 x weekly - 2-3 sets - 10-15 reps               GOALS: Goals reviewed with patient? Yes  SHORT TERM GOALS: Target date: 09/21/2024   Pt will be consistent with initial HEP and walking program for improved strength, balance, transfers and gait. Baseline: pt not performing consistently, encouraged to create an exercise schedule on 2/6 Goal status: NOT MET  2.  Pt will improve 5 x STS to less than or equal to 35 seconds to demonstrate improved functional strength and transfer efficiency.   Baseline: 41.4s w/BUE support; 32.84s w/BUE support  Goal status: MET  3.  Pt will improve gait velocity to at least 0.55 m/s w/LRAD and SBA for improved gait efficiency and reduced fall risk   Baseline: 0.93m/s w/RW; 0.5 m/s w/RW  Goal status: PARTIALLY MET   4.  Pt will ascend/descend 12 steps w/single rail on  R and step-to pattern w/SBA for improved safety at home  Baseline:  Goal status: INITIAL  5.  Pt  will perform floor transfer w/mod A for fall recovery and safety at home  Baseline:  Goal status: INITIAL   LONG TERM GOALS: Target date: 10/19/2024   Pt will be consistent with final HEP and walking program for improved strength, balance, transfers and gait. Baseline:  Goal status: INITIAL  2.  Pt will improve 5 x STS to less than or equal to 28 seconds to demonstrate improved functional strength and transfer efficiency.   Baseline: 41.4s w/BUE support  Goal status: INITIAL  3.  Pt will improve gait velocity to at least 0.65 m/s w/LRAD mod I for improved gait efficiency and independence   Baseline: 0.4 m/s w/RW  Goal status: INITIAL  4.  Pt will perform floor transfer w/min A for fall recovery and safety at home  Baseline:  Goal status: INITIAL   ASSESSMENT:  CLINICAL IMPRESSION: Emphasis of skilled PT session on STG assessment, pt education and hip extension/abduction strength. Pt has met 1 of 5 and partially met 1 of 5 STGs so far. Pt reports he is not doing his HEP as much as I should, so encouraged he and his wife create a morning exercise schedule to improve consistency and compliance w/exercises. Pt did improve his time on 5x STS despite difficulty w/proper hand placement on first rep. Pt also improved his gait speed, narrowly missing his goal speed so consider this partially met. Unable to assess floor transfers this date due to lack of equipment, so will assess next session. Pt performed mini squats well in tall kneel position, so will continue to work on this in PT to promote upright posture w/gait. Continue POC.    OBJECTIVE IMPAIRMENTS: Abnormal gait, decreased activity tolerance, decreased balance, decreased cognition, decreased coordination, decreased endurance, decreased knowledge of condition, decreased knowledge of use of DME, decreased mobility, difficulty walking, decreased strength, decreased safety awareness, increased edema, impaired perceived functional  ability, impaired sensation, impaired tone, impaired vision/preception, and improper body mechanics.   ACTIVITY LIMITATIONS: carrying, lifting, bending, standing, squatting, stairs, transfers, reach over head, hygiene/grooming, locomotion level, and caring for others  PARTICIPATION LIMITATIONS: meal prep, cleaning, laundry, medication management, personal finances, interpersonal relationship, driving, shopping, community activity, and yard work  PERSONAL FACTORS: Fitness, Past/current experiences, and 1-2 comorbidities: CVA and aneurysm are also affecting patient's functional outcome.   REHAB POTENTIAL: Good  CLINICAL DECISION MAKING: Evolving/moderate complexity  EVALUATION COMPLEXITY: Moderate  PLAN:  PT FREQUENCY: 2x/week  PT DURATION: 8 weeks  PLANNED INTERVENTIONS: 97164- PT Re-evaluation, 97750- Physical Performance Testing, 97110-Therapeutic exercises, 97530- Therapeutic activity, 97112- Neuromuscular re-education, 97535- Self Care, [id number]- Manual therapy, [phone]- Gait training, [phone]- Aquatic Therapy, [phone]- Electrical stimulation (manual), Patient/Family education, Balance training, Stair training, Taping, Joint mobilization, Spinal mobilization, Vestibular training, Visual/preceptual remediation/compensation, and DME instructions  PLAN FOR NEXT SESSION: Finish STGs. Assess vitals. Start on SciFit. Add to walking program and HEP for improved BLE strength (L glute med strength, safety w/RW). Stairs, floor transfers, endurance, toe taps to 4-6 step, hip extension, visual scanning to L side, sidestepping, progression of step ups, work on upright posture  Has 2 and 5# dumbbell type weights at home.    Shalina Norfolk E Chrishauna Mee, PT, DPT  09/25/2024, 2:47 PM        "

## 2024-09-29 ENCOUNTER — Ambulatory Visit

## 2024-09-29 ENCOUNTER — Ambulatory Visit: Admitting: Physical Therapy

## 2024-10-02 ENCOUNTER — Ambulatory Visit: Admitting: Physical Therapy

## 2024-10-06 ENCOUNTER — Ambulatory Visit: Admitting: Physical Therapy

## 2024-10-06 ENCOUNTER — Ambulatory Visit

## 2024-10-09 ENCOUNTER — Ambulatory Visit: Admitting: Physical Therapy

## 2024-10-13 ENCOUNTER — Ambulatory Visit

## 2024-10-13 ENCOUNTER — Ambulatory Visit: Admitting: Physical Therapy

## 2024-10-16 ENCOUNTER — Ambulatory Visit: Admitting: Physical Therapy

## 2024-10-19 ENCOUNTER — Ambulatory Visit: Admitting: Physical Therapy

## 2024-10-20 ENCOUNTER — Ambulatory Visit: Admitting: Podiatry

## 2024-10-21 ENCOUNTER — Ambulatory Visit

## 2024-10-21 ENCOUNTER — Ambulatory Visit: Admitting: Physical Therapy

## 2024-10-28 ENCOUNTER — Ambulatory Visit

## 2024-10-29 ENCOUNTER — Encounter: Admitting: Physical Medicine & Rehabilitation

## 2024-11-04 ENCOUNTER — Ambulatory Visit

## 2024-11-11 ENCOUNTER — Ambulatory Visit

## 2024-12-16 ENCOUNTER — Ambulatory Visit: Admitting: Vascular Surgery

## 2025-02-22 ENCOUNTER — Ambulatory Visit: Admitting: Adult Health

## 2025-03-23 ENCOUNTER — Ambulatory Visit: Admitting: Adult Health
